# Patient Record
Sex: Female | Born: 1960 | Race: White | Hispanic: No | Marital: Married | Smoking: Former smoker
Health system: Southern US, Community
[De-identification: ages and names within clinical notes are randomized; demographics above are authoritative.]

## PROBLEM LIST (undated history)

## (undated) DIAGNOSIS — Z800 Family history of malignant neoplasm of digestive organs: Secondary | ICD-10-CM

## (undated) DIAGNOSIS — R609 Edema, unspecified: Secondary | ICD-10-CM

## (undated) DIAGNOSIS — I10 Essential (primary) hypertension: Secondary | ICD-10-CM

## (undated) DIAGNOSIS — M8580 Other specified disorders of bone density and structure, unspecified site: Secondary | ICD-10-CM

## (undated) DIAGNOSIS — F429 Obsessive-compulsive disorder, unspecified: Secondary | ICD-10-CM

## (undated) DIAGNOSIS — M1990 Unspecified osteoarthritis, unspecified site: Secondary | ICD-10-CM

## (undated) DIAGNOSIS — Z860101 Personal history of adenomatous and serrated colon polyps: Secondary | ICD-10-CM

## (undated) DIAGNOSIS — F1021 Alcohol dependence, in remission: Secondary | ICD-10-CM

## (undated) DIAGNOSIS — H409 Unspecified glaucoma: Secondary | ICD-10-CM

## (undated) DIAGNOSIS — E559 Vitamin D deficiency, unspecified: Secondary | ICD-10-CM

## (undated) DIAGNOSIS — Z780 Asymptomatic menopausal state: Secondary | ICD-10-CM

## (undated) DIAGNOSIS — D1399 Benign neoplasm of ill-defined sites within the digestive system: Secondary | ICD-10-CM

## (undated) DIAGNOSIS — K219 Gastro-esophageal reflux disease without esophagitis: Secondary | ICD-10-CM

## (undated) DIAGNOSIS — E039 Hypothyroidism, unspecified: Secondary | ICD-10-CM

## (undated) DIAGNOSIS — I5032 Chronic diastolic (congestive) heart failure: Secondary | ICD-10-CM

## (undated) DIAGNOSIS — H269 Unspecified cataract: Secondary | ICD-10-CM

## (undated) DIAGNOSIS — R51 Headache: Secondary | ICD-10-CM

## (undated) DIAGNOSIS — R519 Headache, unspecified: Secondary | ICD-10-CM

## (undated) DIAGNOSIS — N6092 Unspecified benign mammary dysplasia of left breast: Secondary | ICD-10-CM

## (undated) DIAGNOSIS — I219 Acute myocardial infarction, unspecified: Secondary | ICD-10-CM

## (undated) DIAGNOSIS — E119 Type 2 diabetes mellitus without complications: Secondary | ICD-10-CM

## (undated) DIAGNOSIS — I2510 Atherosclerotic heart disease of native coronary artery without angina pectoris: Secondary | ICD-10-CM

## (undated) DIAGNOSIS — G4700 Insomnia, unspecified: Secondary | ICD-10-CM

## (undated) DIAGNOSIS — Z8742 Personal history of other diseases of the female genital tract: Secondary | ICD-10-CM

## (undated) DIAGNOSIS — F411 Generalized anxiety disorder: Secondary | ICD-10-CM

## (undated) DIAGNOSIS — C50912 Malignant neoplasm of unspecified site of left female breast: Secondary | ICD-10-CM

## (undated) DIAGNOSIS — J309 Allergic rhinitis, unspecified: Secondary | ICD-10-CM

## (undated) DIAGNOSIS — R300 Dysuria: Secondary | ICD-10-CM

## (undated) DIAGNOSIS — D139 Benign neoplasm of ill-defined sites within the digestive system: Secondary | ICD-10-CM

## (undated) DIAGNOSIS — IMO0002 Reserved for concepts with insufficient information to code with codable children: Secondary | ICD-10-CM

## (undated) DIAGNOSIS — Z1231 Encounter for screening mammogram for malignant neoplasm of breast: Secondary | ICD-10-CM

## (undated) DIAGNOSIS — F319 Bipolar disorder, unspecified: Secondary | ICD-10-CM

## (undated) DIAGNOSIS — Z8669 Personal history of other diseases of the nervous system and sense organs: Secondary | ICD-10-CM

## (undated) DIAGNOSIS — F05 Delirium due to known physiological condition: Secondary | ICD-10-CM

## (undated) DIAGNOSIS — E7800 Pure hypercholesterolemia, unspecified: Secondary | ICD-10-CM

## (undated) DIAGNOSIS — J45909 Unspecified asthma, uncomplicated: Secondary | ICD-10-CM

## (undated) DIAGNOSIS — D689 Coagulation defect, unspecified: Secondary | ICD-10-CM

## (undated) DIAGNOSIS — K449 Diaphragmatic hernia without obstruction or gangrene: Secondary | ICD-10-CM

## (undated) DIAGNOSIS — C50919 Malignant neoplasm of unspecified site of unspecified female breast: Secondary | ICD-10-CM

## (undated) DIAGNOSIS — F988 Other specified behavioral and emotional disorders with onset usually occurring in childhood and adolescence: Secondary | ICD-10-CM

## (undated) DIAGNOSIS — D126 Benign neoplasm of colon, unspecified: Secondary | ICD-10-CM

## (undated) DIAGNOSIS — Z86010 Personal history of colonic polyps: Secondary | ICD-10-CM

## (undated) DIAGNOSIS — R42 Dizziness and giddiness: Secondary | ICD-10-CM

## (undated) DIAGNOSIS — F32A Depression, unspecified: Secondary | ICD-10-CM

## (undated) DIAGNOSIS — M797 Fibromyalgia: Secondary | ICD-10-CM

## (undated) DIAGNOSIS — K5190 Ulcerative colitis, unspecified, without complications: Secondary | ICD-10-CM

## (undated) DIAGNOSIS — L719 Rosacea, unspecified: Secondary | ICD-10-CM

## (undated) DIAGNOSIS — F329 Major depressive disorder, single episode, unspecified: Secondary | ICD-10-CM

## (undated) HISTORY — DX: Unspecified glaucoma: H40.9

## (undated) HISTORY — DX: Coagulation defect, unspecified: D68.9

## (undated) HISTORY — DX: Personal history of other diseases of the nervous system and sense organs: Z86.69

## (undated) HISTORY — DX: Family history of malignant neoplasm of digestive organs: Z80.0

## (undated) HISTORY — DX: Personal history of other diseases of the female genital tract: Z87.42

## (undated) HISTORY — DX: Personal history of colonic polyps: Z86.010

## (undated) HISTORY — DX: Malignant neoplasm of unspecified site of left female breast: C50.912

## (undated) HISTORY — DX: Asymptomatic menopausal state: Z78.0

## (undated) HISTORY — DX: Alcohol dependence, in remission: F10.21

## (undated) HISTORY — DX: Personal history of adenomatous and serrated colon polyps: Z86.0101

## (undated) HISTORY — DX: Obsessive-compulsive disorder, unspecified: F42.9

## (undated) HISTORY — DX: Fibromyalgia: M79.7

## (undated) HISTORY — DX: Gastro-esophageal reflux disease without esophagitis: K21.9

## (undated) HISTORY — DX: Allergic rhinitis, unspecified: J30.9

## (undated) HISTORY — DX: Hypothyroidism, unspecified: E03.9

## (undated) HISTORY — DX: Reserved for concepts with insufficient information to code with codable children: IMO0002

## (undated) HISTORY — DX: Essential (primary) hypertension: I10

## (undated) HISTORY — DX: Vitamin D deficiency, unspecified: E55.9

## (undated) HISTORY — DX: Depression, unspecified: F32.A

## (undated) HISTORY — PX: RIGHT AND LEFT HEART CATH: CATH118262

## (undated) HISTORY — DX: Encounter for screening mammogram for malignant neoplasm of breast: Z12.31

## (undated) HISTORY — DX: Malignant neoplasm of unspecified site of unspecified female breast: C50.919

## (undated) HISTORY — DX: Unspecified asthma, uncomplicated: J45.909

## (undated) HISTORY — DX: Benign neoplasm of colon, unspecified: D12.6

## (undated) HISTORY — DX: Benign neoplasm of ill-defined sites within the digestive system: D13.9

## (undated) HISTORY — DX: Benign neoplasm of ill-defined sites within the digestive system: D13.99

## (undated) HISTORY — DX: Unspecified benign mammary dysplasia of left breast: N60.92

## (undated) HISTORY — DX: Insomnia, unspecified: G47.00

## (undated) HISTORY — DX: Rosacea, unspecified: L71.9

## (undated) HISTORY — DX: Delirium due to known physiological condition: F05

## (undated) HISTORY — DX: Dysuria: R30.0

## (undated) HISTORY — DX: Diaphragmatic hernia without obstruction or gangrene: K44.9

## (undated) HISTORY — DX: Bipolar disorder, unspecified: F31.9

## (undated) HISTORY — PX: WISDOM TOOTH EXTRACTION: SHX21

## (undated) HISTORY — PX: BUNIONECTOMY: SHX129

## (undated) HISTORY — DX: Ulcerative colitis, unspecified, without complications: K51.90

## (undated) HISTORY — DX: Acute myocardial infarction, unspecified: I21.9

## (undated) HISTORY — DX: Generalized anxiety disorder: F41.1

## (undated) HISTORY — DX: Major depressive disorder, single episode, unspecified: F32.9

## (undated) HISTORY — DX: Unspecified cataract: H26.9

## (undated) HISTORY — DX: Other specified behavioral and emotional disorders with onset usually occurring in childhood and adolescence: F98.8

## (undated) HISTORY — PX: APPENDECTOMY: SHX54

## (undated) HISTORY — DX: Other specified disorders of bone density and structure, unspecified site: M85.80

## (undated) HISTORY — PX: FEMUR FRACTURE SURGERY: SHX633

## (undated) HISTORY — DX: Type 2 diabetes mellitus without complications: E11.9

## (undated) HISTORY — PX: MASTECTOMY: SHX3

## (undated) HISTORY — PX: CATARACT EXTRACTION W/ INTRAOCULAR LENS IMPLANT: SHX1309

## (undated) HISTORY — DX: Dizziness and giddiness: R42

## (undated) HISTORY — PX: TUBAL LIGATION: SHX77

## (undated) HISTORY — DX: Edema, unspecified: R60.9

## (undated) HISTORY — DX: Pure hypercholesterolemia, unspecified: E78.00

## (undated) HISTORY — PX: OTHER SURGICAL HISTORY: SHX169

---

## 1982-01-14 HISTORY — PX: TONSILLECTOMY: SUR1361

## 1995-01-15 HISTORY — PX: CHOLECYSTECTOMY: SHX55

## 1997-01-14 HISTORY — PX: MECKEL DIVERTICULUM EXCISION: SHX314

## 1997-05-14 HISTORY — PX: NASAL SINUS SURGERY: SHX719

## 1997-05-24 ENCOUNTER — Other Ambulatory Visit: Admission: RE | Admit: 1997-05-24 | Discharge: 1997-05-24 | Payer: Self-pay | Admitting: Otolaryngology

## 1998-01-14 DIAGNOSIS — N6092 Unspecified benign mammary dysplasia of left breast: Secondary | ICD-10-CM

## 1998-01-14 HISTORY — DX: Unspecified benign mammary dysplasia of left breast: N60.92

## 1998-08-15 HISTORY — PX: BREAST SURGERY: SHX581

## 1998-08-15 HISTORY — PX: BREAST BIOPSY: SHX20

## 2001-01-14 HISTORY — PX: COLONOSCOPY: SHX174

## 2001-09-14 HISTORY — PX: ESOPHAGOGASTRODUODENOSCOPY: SHX1529

## 2001-09-18 ENCOUNTER — Encounter: Payer: Self-pay | Admitting: Internal Medicine

## 2003-03-15 ENCOUNTER — Encounter: Payer: Self-pay | Admitting: Family Medicine

## 2003-03-15 LAB — CONVERTED CEMR LAB: TSH: 20.14 microintl units/mL

## 2003-11-15 HISTORY — PX: OTHER SURGICAL HISTORY: SHX169

## 2003-11-18 ENCOUNTER — Ambulatory Visit: Payer: Self-pay | Admitting: Cardiology

## 2003-11-30 ENCOUNTER — Ambulatory Visit: Payer: Self-pay | Admitting: Cardiology

## 2004-01-27 ENCOUNTER — Ambulatory Visit: Payer: Self-pay | Admitting: Family Medicine

## 2004-02-13 ENCOUNTER — Ambulatory Visit: Payer: Self-pay | Admitting: Oncology

## 2004-03-09 ENCOUNTER — Ambulatory Visit: Payer: Self-pay | Admitting: Family Medicine

## 2004-03-19 ENCOUNTER — Ambulatory Visit: Payer: Self-pay | Admitting: Family Medicine

## 2004-04-03 ENCOUNTER — Ambulatory Visit: Payer: Self-pay | Admitting: Internal Medicine

## 2004-04-14 HISTORY — PX: COLONOSCOPY: SHX174

## 2004-04-17 ENCOUNTER — Ambulatory Visit: Payer: Self-pay | Admitting: Internal Medicine

## 2005-01-18 ENCOUNTER — Encounter (INDEPENDENT_AMBULATORY_CARE_PROVIDER_SITE_OTHER): Payer: Self-pay | Admitting: *Deleted

## 2005-01-18 ENCOUNTER — Ambulatory Visit: Payer: Self-pay | Admitting: Family Medicine

## 2005-01-18 ENCOUNTER — Other Ambulatory Visit: Admission: RE | Admit: 2005-01-18 | Discharge: 2005-01-18 | Payer: Self-pay | Admitting: Family Medicine

## 2005-01-18 LAB — CONVERTED CEMR LAB: Pap Smear: NORMAL

## 2005-01-30 ENCOUNTER — Ambulatory Visit: Payer: Self-pay | Admitting: Family Medicine

## 2005-03-21 ENCOUNTER — Ambulatory Visit: Payer: Self-pay | Admitting: Family Medicine

## 2005-03-27 ENCOUNTER — Ambulatory Visit: Payer: Self-pay | Admitting: Internal Medicine

## 2005-05-01 ENCOUNTER — Ambulatory Visit: Payer: Self-pay | Admitting: Family Medicine

## 2005-05-22 ENCOUNTER — Ambulatory Visit: Payer: Self-pay | Admitting: Family Medicine

## 2005-06-12 ENCOUNTER — Ambulatory Visit: Payer: Self-pay | Admitting: Oncology

## 2005-06-14 ENCOUNTER — Ambulatory Visit: Payer: Self-pay | Admitting: Oncology

## 2005-09-27 ENCOUNTER — Ambulatory Visit: Payer: Self-pay | Admitting: Internal Medicine

## 2005-10-22 ENCOUNTER — Ambulatory Visit: Payer: Self-pay | Admitting: Internal Medicine

## 2006-04-22 ENCOUNTER — Ambulatory Visit: Payer: Self-pay | Admitting: Family Medicine

## 2006-04-23 ENCOUNTER — Encounter: Payer: Self-pay | Admitting: Family Medicine

## 2006-07-22 ENCOUNTER — Ambulatory Visit: Payer: Self-pay | Admitting: Family Medicine

## 2006-07-22 LAB — CONVERTED CEMR LAB
Bilirubin Urine: NEGATIVE
Glucose, Urine, Semiquant: NEGATIVE
Ketones, urine, test strip: NEGATIVE
Nitrite: NEGATIVE
Protein, U semiquant: NEGATIVE
Specific Gravity, Urine: 1.015
Urobilinogen, UA: NEGATIVE
pH: 7

## 2006-07-28 ENCOUNTER — Emergency Department: Payer: Self-pay | Admitting: Internal Medicine

## 2006-08-05 ENCOUNTER — Telehealth (INDEPENDENT_AMBULATORY_CARE_PROVIDER_SITE_OTHER): Payer: Self-pay | Admitting: *Deleted

## 2006-08-06 ENCOUNTER — Ambulatory Visit: Payer: Self-pay | Admitting: Family Medicine

## 2006-08-06 LAB — CONVERTED CEMR LAB
Bilirubin Urine: NEGATIVE
Blood in Urine, dipstick: NEGATIVE
Glucose, Urine, Semiquant: NEGATIVE
Ketones, urine, test strip: NEGATIVE
Nitrite: NEGATIVE
Protein, U semiquant: NEGATIVE
Specific Gravity, Urine: <1.005
Urobilinogen, UA: NEGATIVE
WBC Urine, dipstick: NEGATIVE
pH: 7

## 2006-08-07 ENCOUNTER — Encounter (INDEPENDENT_AMBULATORY_CARE_PROVIDER_SITE_OTHER): Payer: Self-pay | Admitting: Internal Medicine

## 2006-08-08 ENCOUNTER — Telehealth (INDEPENDENT_AMBULATORY_CARE_PROVIDER_SITE_OTHER): Payer: Self-pay | Admitting: *Deleted

## 2006-08-19 ENCOUNTER — Encounter (INDEPENDENT_AMBULATORY_CARE_PROVIDER_SITE_OTHER): Payer: Self-pay | Admitting: Internal Medicine

## 2006-08-19 ENCOUNTER — Ambulatory Visit: Payer: Self-pay | Admitting: Family Medicine

## 2006-08-19 LAB — CONVERTED CEMR LAB
Glucose, Urine, Semiquant: UNDETERMINED
Ketones, urine, test strip: UNDETERMINED
Nitrite: UNDETERMINED
Protein, U semiquant: UNDETERMINED

## 2006-10-09 ENCOUNTER — Encounter: Payer: Self-pay | Admitting: Family Medicine

## 2006-10-28 ENCOUNTER — Telehealth (INDEPENDENT_AMBULATORY_CARE_PROVIDER_SITE_OTHER): Payer: Self-pay | Admitting: Internal Medicine

## 2006-10-30 ENCOUNTER — Ambulatory Visit: Payer: Self-pay | Admitting: Family Medicine

## 2006-10-30 DIAGNOSIS — M797 Fibromyalgia: Secondary | ICD-10-CM | POA: Insufficient documentation

## 2006-10-30 DIAGNOSIS — G4700 Insomnia, unspecified: Secondary | ICD-10-CM | POA: Insufficient documentation

## 2006-12-03 ENCOUNTER — Telehealth: Payer: Self-pay | Admitting: Family Medicine

## 2006-12-15 HISTORY — PX: COLONOSCOPY: SHX174

## 2006-12-25 ENCOUNTER — Ambulatory Visit: Payer: Self-pay | Admitting: Internal Medicine

## 2007-01-07 ENCOUNTER — Ambulatory Visit: Payer: Self-pay | Admitting: Family Medicine

## 2007-01-07 DIAGNOSIS — F319 Bipolar disorder, unspecified: Secondary | ICD-10-CM

## 2007-01-07 DIAGNOSIS — J45909 Unspecified asthma, uncomplicated: Secondary | ICD-10-CM | POA: Insufficient documentation

## 2007-01-07 DIAGNOSIS — E1169 Type 2 diabetes mellitus with other specified complication: Secondary | ICD-10-CM | POA: Insufficient documentation

## 2007-01-07 DIAGNOSIS — K5190 Ulcerative colitis, unspecified, without complications: Secondary | ICD-10-CM | POA: Insufficient documentation

## 2007-01-07 DIAGNOSIS — E7800 Pure hypercholesterolemia, unspecified: Secondary | ICD-10-CM

## 2007-01-07 DIAGNOSIS — Z87898 Personal history of other specified conditions: Secondary | ICD-10-CM | POA: Insufficient documentation

## 2007-01-07 DIAGNOSIS — I10 Essential (primary) hypertension: Secondary | ICD-10-CM | POA: Insufficient documentation

## 2007-01-07 DIAGNOSIS — J309 Allergic rhinitis, unspecified: Secondary | ICD-10-CM | POA: Insufficient documentation

## 2007-01-07 DIAGNOSIS — F3130 Bipolar disorder, current episode depressed, mild or moderate severity, unspecified: Secondary | ICD-10-CM | POA: Insufficient documentation

## 2007-01-07 DIAGNOSIS — K219 Gastro-esophageal reflux disease without esophagitis: Secondary | ICD-10-CM | POA: Insufficient documentation

## 2007-01-07 DIAGNOSIS — L719 Rosacea, unspecified: Secondary | ICD-10-CM | POA: Insufficient documentation

## 2007-01-07 DIAGNOSIS — E039 Hypothyroidism, unspecified: Secondary | ICD-10-CM | POA: Insufficient documentation

## 2007-01-09 DIAGNOSIS — Z860100 Personal history of colon polyps, unspecified: Secondary | ICD-10-CM | POA: Insufficient documentation

## 2007-01-09 DIAGNOSIS — K449 Diaphragmatic hernia without obstruction or gangrene: Secondary | ICD-10-CM | POA: Insufficient documentation

## 2007-01-09 DIAGNOSIS — D139 Benign neoplasm of ill-defined sites within the digestive system: Secondary | ICD-10-CM | POA: Insufficient documentation

## 2007-01-09 LAB — CONVERTED CEMR LAB
Albumin: 3.6 g/dL (ref 3.5–5.2)
BUN: 13 mg/dL (ref 6–23)
CO2: 30 meq/L (ref 19–32)
Calcium: 9.5 mg/dL (ref 8.4–10.5)
Chloride: 103 meq/L (ref 96–112)
Creatinine, Ser: 0.7 mg/dL (ref 0.4–1.2)
GFR calc Af Amer: 115 mL/min
GFR calc non Af Amer: 95 mL/min
Glucose, Bld: 99 mg/dL (ref 70–99)
Phosphorus: 3.1 mg/dL (ref 2.3–4.6)
Potassium: 3.8 meq/L (ref 3.5–5.1)
Pro B Natriuretic peptide (BNP): 7 pg/mL (ref 0.0–100.0)
Sodium: 139 meq/L (ref 135–145)

## 2007-01-14 ENCOUNTER — Encounter: Payer: Self-pay | Admitting: Internal Medicine

## 2007-01-14 ENCOUNTER — Encounter: Payer: Self-pay | Admitting: Family Medicine

## 2007-01-14 ENCOUNTER — Ambulatory Visit (HOSPITAL_COMMUNITY): Admission: RE | Admit: 2007-01-14 | Discharge: 2007-01-14 | Payer: Self-pay | Admitting: Internal Medicine

## 2007-01-14 LAB — HM COLONOSCOPY: HM Colonoscopy: NORMAL

## 2007-01-20 ENCOUNTER — Ambulatory Visit: Payer: Self-pay | Admitting: Internal Medicine

## 2007-01-29 ENCOUNTER — Ambulatory Visit: Payer: Self-pay | Admitting: Family Medicine

## 2007-02-02 ENCOUNTER — Encounter (INDEPENDENT_AMBULATORY_CARE_PROVIDER_SITE_OTHER): Payer: Self-pay | Admitting: *Deleted

## 2007-02-02 LAB — CONVERTED CEMR LAB
Albumin: 3.7 g/dL (ref 3.5–5.2)
BUN: 13 mg/dL (ref 6–23)
CO2: 33 meq/L — ABNORMAL HIGH (ref 19–32)
Calcium: 9.6 mg/dL (ref 8.4–10.5)
Chloride: 102 meq/L (ref 96–112)
Creatinine, Ser: 0.8 mg/dL (ref 0.4–1.2)
GFR calc Af Amer: 98 mL/min
GFR calc non Af Amer: 81 mL/min
Glucose, Bld: 122 mg/dL — ABNORMAL HIGH (ref 70–99)
Phosphorus: 2.3 mg/dL (ref 2.3–4.6)
Potassium: 3.3 meq/L — ABNORMAL LOW (ref 3.5–5.1)
Sodium: 142 meq/L (ref 135–145)

## 2007-03-17 ENCOUNTER — Ambulatory Visit: Payer: Self-pay | Admitting: Family Medicine

## 2007-05-11 ENCOUNTER — Telehealth: Payer: Self-pay | Admitting: Family Medicine

## 2007-06-03 ENCOUNTER — Telehealth: Payer: Self-pay | Admitting: Family Medicine

## 2007-06-09 ENCOUNTER — Telehealth (INDEPENDENT_AMBULATORY_CARE_PROVIDER_SITE_OTHER): Payer: Self-pay | Admitting: Internal Medicine

## 2007-06-09 ENCOUNTER — Telehealth: Payer: Self-pay | Admitting: Family Medicine

## 2007-06-15 HISTORY — PX: OTHER SURGICAL HISTORY: SHX169

## 2007-06-18 ENCOUNTER — Ambulatory Visit: Payer: Self-pay | Admitting: Family Medicine

## 2007-06-22 ENCOUNTER — Ambulatory Visit: Payer: Self-pay | Admitting: Family Medicine

## 2007-06-23 LAB — CONVERTED CEMR LAB
ALT: 29 units/L (ref 0–35)
AST: 26 units/L (ref 0–37)
Albumin: 4 g/dL (ref 3.5–5.2)
Alkaline Phosphatase: 49 units/L (ref 39–117)
BUN: 13 mg/dL (ref 6–23)
Basophils Absolute: 0 10*3/uL (ref 0.0–0.1)
Basophils Relative: 0.7 % (ref 0.0–1.0)
Bilirubin, Direct: 0.1 mg/dL (ref 0.0–0.3)
CO2: 28 meq/L (ref 19–32)
Calcium: 9.4 mg/dL (ref 8.4–10.5)
Chloride: 99 meq/L (ref 96–112)
Cholesterol: 191 mg/dL (ref 0–200)
Creatinine, Ser: 0.9 mg/dL (ref 0.4–1.2)
Eosinophils Absolute: 0.2 10*3/uL (ref 0.0–0.7)
Eosinophils Relative: 3.3 % (ref 0.0–5.0)
GFR calc Af Amer: 86 mL/min
GFR calc non Af Amer: 71 mL/min
Glucose, Bld: 105 mg/dL — ABNORMAL HIGH (ref 70–99)
HCT: 41.1 % (ref 36.0–46.0)
HDL: 50.3 mg/dL (ref >39.0)
Hemoglobin: 14 g/dL (ref 12.0–15.0)
LDL Cholesterol: 124 mg/dL — ABNORMAL HIGH (ref 0–99)
Lymphocytes Relative: 23.2 % (ref 12.0–46.0)
MCHC: 34.2 g/dL (ref 30.0–36.0)
MCV: 92.9 fL (ref 78.0–100.0)
Monocytes Absolute: 0.6 10*3/uL (ref 0.1–1.0)
Monocytes Relative: 11 % (ref 3.0–12.0)
Neutro Abs: 3.3 10*3/uL (ref 1.4–7.7)
Neutrophils Relative %: 61.8 % (ref 43.0–77.0)
Platelets: 333 10*3/uL (ref 150–400)
Potassium: 3.7 meq/L (ref 3.5–5.1)
RBC: 4.42 M/uL (ref 3.87–5.11)
RDW: 15 % — ABNORMAL HIGH (ref 11.5–14.6)
Sodium: 139 meq/L (ref 135–145)
Total Bilirubin: 0.8 mg/dL (ref 0.3–1.2)
Total CHOL/HDL Ratio: 3.8
Total Protein: 7.1 g/dL (ref 6.0–8.3)
Triglycerides: 85 mg/dL (ref 0–149)
VLDL: 17 mg/dL (ref 0–40)
WBC: 5.4 10*3/uL (ref 4.5–10.5)

## 2007-06-25 ENCOUNTER — Ambulatory Visit: Payer: Self-pay

## 2007-06-25 ENCOUNTER — Encounter: Payer: Self-pay | Admitting: Family Medicine

## 2007-07-10 ENCOUNTER — Encounter: Payer: Self-pay | Admitting: Internal Medicine

## 2007-07-16 ENCOUNTER — Encounter: Payer: Self-pay | Admitting: Family Medicine

## 2007-07-16 ENCOUNTER — Ambulatory Visit: Payer: Self-pay | Admitting: Family Medicine

## 2007-07-21 ENCOUNTER — Encounter (INDEPENDENT_AMBULATORY_CARE_PROVIDER_SITE_OTHER): Payer: Self-pay | Admitting: *Deleted

## 2007-07-21 ENCOUNTER — Encounter: Payer: Self-pay | Admitting: Family Medicine

## 2007-08-25 ENCOUNTER — Encounter: Payer: Self-pay | Admitting: Family Medicine

## 2007-10-05 ENCOUNTER — Encounter: Payer: Self-pay | Admitting: Internal Medicine

## 2007-10-13 ENCOUNTER — Encounter: Payer: Self-pay | Admitting: Family Medicine

## 2007-10-20 ENCOUNTER — Ambulatory Visit: Payer: Self-pay | Admitting: Family Medicine

## 2007-11-13 ENCOUNTER — Telehealth: Payer: Self-pay | Admitting: Family Medicine

## 2007-11-15 HISTORY — PX: OTHER SURGICAL HISTORY: SHX169

## 2007-11-20 ENCOUNTER — Encounter: Payer: Self-pay | Admitting: Internal Medicine

## 2008-01-11 ENCOUNTER — Telehealth: Payer: Self-pay | Admitting: Internal Medicine

## 2008-01-20 ENCOUNTER — Ambulatory Visit: Payer: Self-pay | Admitting: Internal Medicine

## 2008-01-22 ENCOUNTER — Telehealth: Payer: Self-pay | Admitting: Family Medicine

## 2008-02-05 ENCOUNTER — Encounter: Payer: Self-pay | Admitting: Family Medicine

## 2008-02-29 ENCOUNTER — Telehealth: Payer: Self-pay | Admitting: Family Medicine

## 2008-03-03 ENCOUNTER — Telehealth: Payer: Self-pay | Admitting: Family Medicine

## 2008-03-18 ENCOUNTER — Ambulatory Visit: Payer: Self-pay | Admitting: Family Medicine

## 2008-03-18 DIAGNOSIS — E559 Vitamin D deficiency, unspecified: Secondary | ICD-10-CM | POA: Insufficient documentation

## 2008-04-01 ENCOUNTER — Encounter: Payer: Self-pay | Admitting: Family Medicine

## 2008-04-08 ENCOUNTER — Telehealth: Payer: Self-pay | Admitting: Family Medicine

## 2008-04-18 ENCOUNTER — Telehealth: Payer: Self-pay | Admitting: Internal Medicine

## 2008-04-18 ENCOUNTER — Telehealth: Payer: Self-pay | Admitting: Family Medicine

## 2008-05-18 ENCOUNTER — Telehealth (INDEPENDENT_AMBULATORY_CARE_PROVIDER_SITE_OTHER): Payer: Self-pay | Admitting: Internal Medicine

## 2008-06-20 ENCOUNTER — Telehealth: Payer: Self-pay | Admitting: Family Medicine

## 2008-06-22 ENCOUNTER — Ambulatory Visit: Payer: Self-pay | Admitting: Family Medicine

## 2008-06-22 DIAGNOSIS — Z780 Asymptomatic menopausal state: Secondary | ICD-10-CM | POA: Insufficient documentation

## 2008-06-24 LAB — CONVERTED CEMR LAB
ALT: 44 units/L — ABNORMAL HIGH (ref 0–35)
AST: 41 units/L — ABNORMAL HIGH (ref 0–37)
Albumin: 4 g/dL (ref 3.5–5.2)
Alkaline Phosphatase: 77 units/L (ref 39–117)
BUN: 12 mg/dL (ref 6–23)
Basophils Absolute: 0 10*3/uL (ref 0.0–0.1)
Basophils Relative: 0.5 % (ref 0.0–3.0)
Bilirubin, Direct: 0.1 mg/dL (ref 0.0–0.3)
CO2: 29 meq/L (ref 19–32)
Calcium: 9.6 mg/dL (ref 8.4–10.5)
Chloride: 109 meq/L (ref 96–112)
Cholesterol: 314 mg/dL — ABNORMAL HIGH (ref 0–200)
Creatinine, Ser: 0.7 mg/dL (ref 0.4–1.2)
Direct LDL: 227.3 mg/dL
Eosinophils Absolute: 0.2 10*3/uL (ref 0.0–0.7)
Eosinophils Relative: 2.8 % (ref 0.0–5.0)
GFR calc non Af Amer: 94.17 mL/min (ref >60)
Glucose, Bld: 99 mg/dL (ref 70–99)
HCT: 39.3 % (ref 36.0–46.0)
HDL: 46.4 mg/dL (ref >39.00)
Hemoglobin: 13.8 g/dL (ref 12.0–15.0)
Lymphocytes Relative: 24 % (ref 12.0–46.0)
Lymphs Abs: 1.6 10*3/uL (ref 0.7–4.0)
MCHC: 35.2 g/dL (ref 30.0–36.0)
MCV: 89.9 fL (ref 78.0–100.0)
Monocytes Absolute: 0.6 10*3/uL (ref 0.1–1.0)
Monocytes Relative: 9 % (ref 3.0–12.0)
Neutro Abs: 4.3 10*3/uL (ref 1.4–7.7)
Neutrophils Relative %: 63.7 % (ref 43.0–77.0)
Platelets: 323 10*3/uL (ref 150.0–400.0)
Potassium: 4.3 meq/L (ref 3.5–5.1)
RBC: 4.37 M/uL (ref 3.87–5.11)
RDW: 13.7 % (ref 11.5–14.6)
Sodium: 144 meq/L (ref 135–145)
TSH: 13.58 microintl units/mL — ABNORMAL HIGH (ref 0.35–5.50)
Total Bilirubin: 0.6 mg/dL (ref 0.3–1.2)
Total CHOL/HDL Ratio: 7
Total Protein: 6.7 g/dL (ref 6.0–8.3)
Triglycerides: 276 mg/dL — ABNORMAL HIGH (ref 0.0–149.0)
VLDL: 55.2 mg/dL — ABNORMAL HIGH (ref 0.0–40.0)
WBC: 6.7 10*3/uL (ref 4.5–10.5)

## 2008-07-01 ENCOUNTER — Telehealth (INDEPENDENT_AMBULATORY_CARE_PROVIDER_SITE_OTHER): Payer: Self-pay | Admitting: Internal Medicine

## 2008-07-05 LAB — CONVERTED CEMR LAB: Vit D, 25-Hydroxy: 21 ng/mL — ABNORMAL LOW (ref 30–89)

## 2008-07-15 ENCOUNTER — Telehealth: Payer: Self-pay | Admitting: Family Medicine

## 2008-07-21 ENCOUNTER — Ambulatory Visit: Payer: Self-pay | Admitting: Family Medicine

## 2008-07-22 ENCOUNTER — Ambulatory Visit: Payer: Self-pay

## 2008-07-26 ENCOUNTER — Ambulatory Visit: Payer: Self-pay | Admitting: Family Medicine

## 2008-07-26 LAB — CONVERTED CEMR LAB
ALT: 36 units/L — ABNORMAL HIGH (ref 0–35)
AST: 25 units/L (ref 0–37)
Cholesterol: 191 mg/dL (ref 0–200)
HDL: 43.8 mg/dL (ref >39.00)
LDL Cholesterol: 114 mg/dL — ABNORMAL HIGH (ref 0–99)
Total CHOL/HDL Ratio: 4
Triglycerides: 167 mg/dL — ABNORMAL HIGH (ref 0.0–149.0)
VLDL: 33.4 mg/dL (ref 0.0–40.0)

## 2008-07-27 LAB — CONVERTED CEMR LAB
Free T4: 1.5 ng/dL (ref 0.6–1.6)
T4, Total: 9.5 ug/dL (ref 5.0–12.5)
TSH: 0.55 microintl units/mL (ref 0.35–5.50)

## 2008-08-17 ENCOUNTER — Encounter: Payer: Self-pay | Admitting: Family Medicine

## 2008-08-17 ENCOUNTER — Ambulatory Visit: Payer: Self-pay | Admitting: Family Medicine

## 2008-08-24 ENCOUNTER — Encounter (INDEPENDENT_AMBULATORY_CARE_PROVIDER_SITE_OTHER): Payer: Self-pay | Admitting: *Deleted

## 2008-09-01 ENCOUNTER — Ambulatory Visit: Payer: Self-pay | Admitting: Family Medicine

## 2008-09-14 LAB — CONVERTED CEMR LAB: Pap Smear: NORMAL

## 2008-11-16 ENCOUNTER — Telehealth: Payer: Self-pay | Admitting: Family Medicine

## 2008-11-24 ENCOUNTER — Ambulatory Visit: Payer: Self-pay | Admitting: Family Medicine

## 2008-11-25 LAB — CONVERTED CEMR LAB
ALT: 36 units/L — ABNORMAL HIGH (ref 0–35)
AST: 26 units/L (ref 0–37)
Albumin: 3.8 g/dL (ref 3.5–5.2)
Alkaline Phosphatase: 73 units/L (ref 39–117)
Bilirubin, Direct: 0 mg/dL (ref 0.0–0.3)
Cholesterol: 181 mg/dL (ref 0–200)
Free T4: 1.2 ng/dL (ref 0.6–1.6)
HDL: 46.8 mg/dL (ref >39.00)
LDL Cholesterol: 114 mg/dL — ABNORMAL HIGH (ref 0–99)
T3 Uptake Ratio: 39.1 % — ABNORMAL HIGH (ref 22.5–37.0)
TSH: 0.09 microintl units/mL — ABNORMAL LOW (ref 0.35–5.50)
Total Bilirubin: 0.8 mg/dL (ref 0.3–1.2)
Total CHOL/HDL Ratio: 4
Total Protein: 7 g/dL (ref 6.0–8.3)
Triglycerides: 100 mg/dL (ref 0.0–149.0)
VLDL: 20 mg/dL (ref 0.0–40.0)

## 2008-11-28 ENCOUNTER — Encounter (INDEPENDENT_AMBULATORY_CARE_PROVIDER_SITE_OTHER): Payer: Self-pay | Admitting: *Deleted

## 2008-11-28 LAB — CONVERTED CEMR LAB: Vit D, 25-Hydroxy: 33 ng/mL (ref 30–89)

## 2008-11-29 ENCOUNTER — Telehealth: Payer: Self-pay | Admitting: Family Medicine

## 2008-12-07 ENCOUNTER — Telehealth: Payer: Self-pay | Admitting: Family Medicine

## 2008-12-13 ENCOUNTER — Telehealth: Payer: Self-pay | Admitting: Family Medicine

## 2008-12-13 ENCOUNTER — Ambulatory Visit: Payer: Self-pay | Admitting: Family Medicine

## 2009-02-01 ENCOUNTER — Encounter: Payer: Self-pay | Admitting: Family Medicine

## 2009-02-07 ENCOUNTER — Telehealth: Payer: Self-pay | Admitting: Family Medicine

## 2009-02-27 ENCOUNTER — Telehealth (INDEPENDENT_AMBULATORY_CARE_PROVIDER_SITE_OTHER): Payer: Self-pay | Admitting: *Deleted

## 2009-02-27 ENCOUNTER — Ambulatory Visit: Payer: Self-pay | Admitting: Family Medicine

## 2009-03-02 ENCOUNTER — Ambulatory Visit: Payer: Self-pay | Admitting: Podiatry

## 2009-03-30 ENCOUNTER — Encounter: Payer: Self-pay | Admitting: Family Medicine

## 2009-05-17 ENCOUNTER — Encounter: Payer: Self-pay | Admitting: Family Medicine

## 2009-06-16 ENCOUNTER — Telehealth: Payer: Self-pay | Admitting: Family Medicine

## 2009-07-18 ENCOUNTER — Telehealth: Payer: Self-pay | Admitting: Family Medicine

## 2009-07-22 LAB — HM MAMMOGRAPHY: HM Mammogram: NORMAL

## 2009-07-28 ENCOUNTER — Encounter: Payer: Self-pay | Admitting: Family Medicine

## 2009-08-09 ENCOUNTER — Ambulatory Visit: Payer: Self-pay | Admitting: Family Medicine

## 2009-08-09 DIAGNOSIS — R5383 Other fatigue: Secondary | ICD-10-CM | POA: Insufficient documentation

## 2009-08-09 DIAGNOSIS — R5381 Other malaise: Secondary | ICD-10-CM | POA: Insufficient documentation

## 2009-08-14 ENCOUNTER — Telehealth: Payer: Self-pay | Admitting: Family Medicine

## 2009-08-22 ENCOUNTER — Telehealth: Payer: Self-pay | Admitting: Family Medicine

## 2009-09-08 ENCOUNTER — Telehealth: Payer: Self-pay | Admitting: Family Medicine

## 2009-09-28 ENCOUNTER — Ambulatory Visit: Payer: Self-pay | Admitting: Family Medicine

## 2009-09-29 ENCOUNTER — Encounter: Payer: Self-pay | Admitting: Family Medicine

## 2009-10-06 ENCOUNTER — Encounter: Payer: Self-pay | Admitting: Family Medicine

## 2009-10-10 ENCOUNTER — Ambulatory Visit: Payer: Self-pay | Admitting: Family Medicine

## 2009-10-10 ENCOUNTER — Telehealth (INDEPENDENT_AMBULATORY_CARE_PROVIDER_SITE_OTHER): Payer: Self-pay | Admitting: *Deleted

## 2009-10-11 ENCOUNTER — Encounter: Payer: Self-pay | Admitting: Family Medicine

## 2009-10-13 ENCOUNTER — Ambulatory Visit: Payer: Self-pay | Admitting: Family Medicine

## 2009-11-20 ENCOUNTER — Telehealth: Payer: Self-pay | Admitting: Family Medicine

## 2009-11-21 ENCOUNTER — Ambulatory Visit: Payer: Self-pay

## 2009-12-14 ENCOUNTER — Telehealth: Payer: Self-pay | Admitting: Family Medicine

## 2009-12-22 ENCOUNTER — Telehealth: Payer: Self-pay | Admitting: Family Medicine

## 2010-01-22 ENCOUNTER — Telehealth: Payer: Self-pay | Admitting: Family Medicine

## 2010-01-29 ENCOUNTER — Encounter: Payer: Self-pay | Admitting: Internal Medicine

## 2010-02-12 ENCOUNTER — Encounter: Payer: Self-pay | Admitting: Family Medicine

## 2010-02-15 ENCOUNTER — Encounter: Payer: Self-pay | Admitting: Family Medicine

## 2010-02-15 NOTE — Progress Notes (Signed)
Summary: Rx Tramadol  Phone Note Refill Request Call back at [phone] Message from:  [facility] on February 07, 2009 8:08 AM  Refills Requested: Medication #1:  TRAMADOL HCL 50 MG TABS 1-2 by mouth three times a day as needed   Last Refilled: 12/12/2008 Received faxed refill request, please advise   Method Requested: Telephone to Pharmacy Initial call taken by: Linde Gillis CMA Duncan Dull),  February 07, 2009 8:09 AM  Follow-up for Phone Call        she was given 81mo px on 11/29-- should not be out yet?  Follow-up by: Judith Part MD,  February 07, 2009 8:32 AM  Additional Follow-up for Phone Call Additional follow up Details #1::        [facility] for pt to call.   Lowella Petties CMA  February 07, 2009 8:39 AM  [facility] called this in back in november for 180 tabs, which is a one month supply, with no refills.  She didnt call in the 3 month supply.  thank you px written on EMR for call in  Additional Follow-up by: Lowella Petties CMA,  February 07, 2009 1:06 PM    Additional Follow-up for Phone Call Additional follow up Details #2::    [facility] Follow-up by: Lowella Petties CMA,  February 07, 2009 2:22 PM  New/Updated Medications: TRAMADOL HCL 50 MG TABS (TRAMADOL HCL) 1-2 by mouth three times a day as needed Prescriptions: TRAMADOL HCL 50 MG TABS (TRAMADOL HCL) 1-2 by mouth three times a day as needed  #180 x 3   Entered and Authorized by:   Judith Part MD   Signed by:   Judith Part MD on 02/07/2009   Method used:   Telephoned to ...       [facility] [phone]* (retail)       [address]       [place], [place]  [address]       Ph: [phone]       Fax: [phone]   RxID:   [id number]

## 2010-02-15 NOTE — Progress Notes (Signed)
Summary: refill request for tramadol  Phone Note Refill Request Message from:  Fax from Pharmacy  Refills Requested: Medication #1:  TRAMADOL HCL 50 MG TABS take one tablet by mouth in AM   Last Refilled: 11/20/2009 Faxed request from [facility], [phone].  Initial call taken by: Lowella Petties CMA, AAMA,  December 22, 2009 10:21 AM  Follow-up for Phone Call        px written on EMR for call in  Follow-up by: Judith Part MD,  December 22, 2009 10:24 AM  Additional Follow-up for Phone Call Additional follow up Details #1::        [facility] as instructed. Lewanda Rife LPN  December 22, 2009 4:37 PM     Prescriptions: TRAMADOL HCL 50 MG TABS (TRAMADOL HCL) take one tablet by mouth in AM, one tablet by mouth at lunch and take two tablets by mouth at bedtime  #120 x 0   Entered and Authorized by:   Judith Part MD   Signed by:   Lewanda Rife LPN on 45/40/9811   Method used:   Telephoned to ...       [facility] [phone]* (retail)       [address]       [place], [place]  [address]       Ph: [phone]       Fax: [phone]   RxID:   [id number]

## 2010-02-15 NOTE — Assessment & Plan Note (Signed)
Summary: FLU SHOT/BIR   Nurse Visit    Prior Medications: NORVASC 5 MG TABS (AMLODIPINE BESYLATE) 1 by mouth two times a day FLUTICASONE PROPIONATE 50 MCG/ACT SUSP (FLUTICASONE PROPIONATE)  SIMVASTATIN 20 MG TABS (SIMVASTATIN) 1 by mouth once daily MERCAPTOPURINE 50 MG TABS (MERCAPTOPURINE) Take 1 tablet by mouth once a day TRAMADOL HCL 50 MG TABS (TRAMADOL HCL) 1 by mouth three times a day prn SYNTHROID 112 MCG  TABS (LEVOTHYROXINE SODIUM) 2 by mouth once daily WELLBUTRIN XL 150 MG  TB24 (BUPROPION HCL) 3 by mouth qd EFFEXOR XR 75 MG  CP24 (VENLAFAXINE HCL) Take 3 by mouth qd TRAZODONE HCL 150 MG  TABS (TRAZODONE HCL) 3 by mouth qhs MELATONIN 3 MG  CAPS (MELATONIN) at bedtime ADDERALL 30 MG  TABS (AMPHETAMINE-DEXTROAMPHETAMINE) take one by mouth every morning ADDERALL 10 MG  TABS (AMPHETAMINE-DEXTROAMPHETAMINE) 1- 2 tabs at lunch ABILIFY 10 MG  TABS (ARIPIPRAZOLE) Take 1 tablet by mouth once a day BUSPAR 15 MG  TABS (BUSPIRONE HCL) Take 1 tablet by mouth qid FLEXERIL 10 MG  TABS (CYCLOBENZAPRINE HCL) 2 by mouth at bedtime as needed HYDROCHLOROTHIAZIDE 25 MG  TABS (HYDROCHLOROTHIAZIDE) Take one by mouth once a day POTASSIUM CHLORIDE CR 10 MEQ  CPCR (POTASSIUM CHLORIDE) take one by mouth once daily DARVOCET A500 100-500 MG  TABS (PROPOXYPHENE N-APAP) 1 by mouth q 6 hours as needed pain FROVA 2.5 MG  TABS (FROVATRIPTAN SUCCINATE) take 1-2 as needed for migraines CHLORPROMAZINE HCL 25 MG  TABS (CHLORPROMAZINE HCL) take 1-2 as needed for migraines if frova doesnt help CALTRATE 600+D 600-400 MG-UNIT  TABS (CALCIUM CARBONATE-VITAMIN D) take one by mouth twice a day PROTONIX 40 MG  TBEC (PANTOPRAZOLE SODIUM) Take 1 tablet by mouth once a day. MUST HAVE OFFICE VISIT FOR ANY FURTHER REFILLS! TYLENOL ARTHRITIS PAIN 650 MG  TBCR (ACETAMINOPHEN) take 2 by mouth twice a day MACRODANTIN () as needed with sexual activity as directed Current Allergies: ! ERYTHROMYCIN ASA NSAIDS    Orders  Added: 1)  Admin 1st Vaccine [90471] 2)  Flu Vaccine 75yr + [[38182]  Flu Vaccine Consent Questions     Do you have a history of severe allergic reactions to this vaccine? no    Any prior history of allergic reactions to egg and/or gelatin? no    Do you have a sensitivity to the preservative Thimersol? no    Do you have a past history of Guillan-Barre Syndrome? no    Do you currently have an acute febrile illness? no    Have you ever had a severe reaction to latex? no    Vaccine information given and explained to patient? yes    Are you currently pregnant? no    Lot Number:[id number]   Site Given Right Deltoid IM].opcflu

## 2010-02-15 NOTE — Progress Notes (Signed)
Summary: Cyclobenzaprine  Phone Note Refill Request Message from:  [facility] on June 16, 2009 11:24 AM  Refills Requested: Medication #1:  FLEXERIL 10 MG  TABS 2 by mouth at bedtime [facility]  [address] [id number]   Last Fill Date:  No date sent   Pharmacy Phone:  [phone]   Method Requested: Telephone to Pharmacy Initial call taken by: Delilah Shan CMA Duncan Dull),  June 16, 2009 11:24 AM  Follow-up for Phone Call        dosing is usually not more than 1 pill at a time -- please check in with pt -- 2 pills at bedtime is not recommended   Follow-up by: Judith Part MD,  June 16, 2009 2:00 PM  Additional Follow-up for Phone Call Additional follow up Details #1::        Left message for patient to call back. Lewanda Rife LPN  June 17, 8655 3:41 PM   Patient notified as instructed by telephone. Pt said she is taking 2 at bedtime. Pt will ck with pharmacy later this evening.Lewanda Rife LPN  June 17, 8467 4:45 PM     Additional Follow-up for Phone Call Additional follow up Details #2::    [facility] as instructed. Lewanda Rife LPN  June 16, 6293 4:54 PM   Prescriptions: FLEXERIL 10 MG  TABS (CYCLOBENZAPRINE HCL) 2 by mouth at bedtime  #60 x 5   Entered and Authorized by:   Judith Part MD   Signed by:   Judith Part MD on 06/16/2009   Method used:   Telephoned to ...       [facility] [phone]* (retail)       [address]       [place], [place]  [address]       Ph: [phone]       Fax: [phone]   RxID:   [id number]

## 2010-02-15 NOTE — Letter (Signed)
Summary: Colonoscopy Letter  [facility]  [place], [place] [address]   Phone: [phone]  Fax: [phone]      January 29, 2010 MRN: [id number]   SHAREESE MACHA [address] [place], [place]  [address]   Dear Ms. Holberg,   According to your medical record, it is time for you to schedule a Colonoscopy. The American Cancer Society recommends this procedure as a method to detect early colon cancer. Patients with a family history of colon cancer, or a personal history of colon polyps or inflammatory bowel disease are at increased risk.  This letter has been generated based on the recommendations made at the time of your procedure. If you feel that in your particular situation this may no longer apply, please contact our office.  Please call our office at [phone] to schedule this appointment or to update your records at your earliest convenience.  Thank you for cooperating with [place] to provide you with the very best care possible.   Sincerely,  Lowella Bandy. Olevia Perches, M.D.  [facility] [phone]

## 2010-02-15 NOTE — Letter (Signed)
Summary: [facility]   Imported By: Laural Benes 10/16/2009 13:23:33  _____________________________________________________________________  External Attachment:    Type:   Image     Comment:   External Document

## 2010-02-15 NOTE — Letter (Signed)
Summary: [facility]  [facility]   Imported By: Lanelle Bal 02/07/2009 14:25:14  _____________________________________________________________________  External Attachment:    Type:   Image     Comment:   External Document

## 2010-02-15 NOTE — Miscellaneous (Signed)
Summary: mercaptopurine refill  Clinical Lists Changes  Medications: Changed medication from MERCAPTOPURINE 50 MG TABS (MERCAPTOPURINE) Take 1 tablet by mouth once a day to MERCAPTOPURINE 50 MG TABS (MERCAPTOPURINE) Take 1 tablet by mouth once a day. MUST HAVE OFFICE VISIT FOR ANY FURTHER REFILLS! - Signed Rx of MERCAPTOPURINE 50 MG TABS (MERCAPTOPURINE) Take 1 tablet by mouth once a day. MUST HAVE OFFICE VISIT FOR ANY FURTHER REFILLS!;  #90 x 0;  Signed;  Entered by: Awilda Bill CMA;  Authorized by: Lafayette Dragon MD;  Method used: Printed then faxed to [facility]*, , ,   , Ph: [phone], Fax: [phone]    Prescriptions: MERCAPTOPURINE 50 MG TABS (MERCAPTOPURINE) Take 1 tablet by mouth once a day. MUST HAVE OFFICE VISIT FOR ANY FURTHER REFILLS!  #90 x 0   Entered by:   Awilda Bill CMA   Authorized by:   Lafayette Dragon MD   Signed by:   Awilda Bill CMA on 11/20/2007   Method used:   Printed then faxed to ...       [facility] (mail-order)             ,          Ph: [phone]       Fax: [phone]   RxID:   [id number]

## 2010-02-15 NOTE — Progress Notes (Signed)
Summary: 90 day Tramadol rx (fax in inbox) ([facility])  Phone Note Refill Request Message from:  Fax from Pharmacy on February 29, 2008 4:26 PM  Refills Requested: Medication #1:  TRAMADOL HCL 50 MG TABS 1 by mouth three times a day prn Initial call taken by: Daralene Milch,  February 29, 2008 4:26 PM  Follow-up for Phone Call        signed in box.  ok Follow-up by: Owens Loffler MD,  February 29, 2008 4:59 PM

## 2010-02-15 NOTE — Letter (Signed)
Summary: [facility] / SAVELLA, FIBROMYALGIA / DR. Craige [facility] / SAVELLA, FIBROMYALGIA / DR. Hurley Cisco   Imported By: Pilar Grammes 04/07/2008 15:10:20  _____________________________________________________________________  External Attachment:    Type:   Image     Comment:   External Document

## 2010-02-15 NOTE — Progress Notes (Signed)
Summary: flexeril refill  Phone Note Refill Request Message from:  [place] on April 08, 2008 1:03 PM  Refills Requested: Medication #1:  FLEXERIL 10 MG  TABS 2 by mouth at bedtime as needed Initial call taken by: Daralene Milch,  April 08, 2008 1:03 PM  Follow-up for Phone Call        did #180 with 3 refils last june- looks like not due yet  Follow-up by: Allena Earing MD,  April 08, 2008 1:20 PM  Additional Follow-up for Phone Call Additional follow up Details #1::        Rx denial called/faxed to pharmacy Additional Follow-up by: Daralene Milch,  April 08, 2008 2:41 PM

## 2010-02-15 NOTE — Assessment & Plan Note (Signed)
Summary: swelling in legs/bir   Vital Signs:  Patient Profile:   50 Years Old Female Weight:      215 pounds Temp:     98.5 degrees F oral Pulse rate:   84 / minute Pulse rhythm:   regular BP sitting:   120 / 80  (left arm) Cuff size:   large  Vitals Entered By: Marty Heck (January 07, 2007 10:31 AM)                 Chief Complaint:  Swelling all over.  History of Present Illness: wt is up 11 lb since last visit joined wt watchers 5 weeks ago- lost wt and gained it all back is trying to loose again  has been gaining over past week- swelling every single day- feet are really swelling at the end of the day started otc diuretic for past week less exercise since her gym closed work has become much more stressful fibromyalgia has become worse more short of breath with exertion since wt gain no pnd , no orthopnea  is off of her wellbutrin for 2 months- because could not get from [facility] on generic norvasc for a while- no problems  no other changes in med no changes in diet did start using miralax  is watching sugar and salt  needs refil of tramadol   has heart disease and chf in family  Current Allergies (reviewed today): ! ERYTHROMYCIN ASA NSAIDS     Review of Systems      See HPI  General      Denies chills, fatigue, fever, loss of appetite, and weight loss.  CV      Denies chest pain or discomfort, difficulty breathing at night, leg cramps with exertion, palpitations, and shortness of breath with exertion.  Resp      Denies cough, shortness of breath, and wheezing.  GI      Denies bloody stools, change in bowel habits, nausea, and vomiting.  MS      Complains of stiffness.  Derm      Denies changes in color of skin and rash.  Neuro      Denies numbness.  Psych      mood has been ok  Endo      Denies excessive thirst and excessive urination.   Physical Exam  General:     overweight but generally well appearing  Head:  normocephalic, atraumatic, and no abnormalities observed.   Eyes:     vision grossly intact, pupils equal, pupils round, and pupils reactive to light.  no pallor or conj icterus Mouth:     pharynx pink and moist.   Neck:     supple with full rom and no masses or thyromegally, no JVD or carotid bruit  Lungs:     Normal respiratory effort, chest expands symmetrically. Lungs are clear to auscultation, no crackles or wheezes. Heart:     Normal rate and regular rhythm. S1 and S2 normal without gallop, murmur, click, rub or other extra sounds. Abdomen:     Bowel sounds positive,abdomen soft and non-tender without masses, organomegaly or hernias noted. Msk:     no acute joint changes Pulses:     R and L carotid,radial,femoral,dorsalis pedis and posterior tibial pulses are full and equal bilaterally Extremities:     1+ left pedal edema and 1+ right pedal edema.   Neurologic:     sensation intact to light touch and gait normal.   Skin:     Intact  without suspicious lesions or rashes Cervical Nodes:     No lymphadenopathy noted Psych:     nl affect, cheerful and talkative    Impression & Recommendations:  Problem # 1:  EDEMA- LOCALIZED (ICD-782.3) Assessment: Unchanged will check renal prof and bnp if ok- will start diuretic- after med review urged salt avoidance and good lifestyle habits will continue to work on wt loss Orders: Venipuncture (92426) TLB-Renal Function Panel (80069-RENAL) TLB-BNP (B-Natriuretic Peptide) (83880-BNPR)   Problem # 2:  FIBROMYALGIA (ICD-729.1) refilled tramadol some worsening of symptoms with wt gain- enc to continue wt watchers and exercise  Her updated medication list for this problem includes:    Tramadol Hcl 50 Mg Tabs (Tramadol hcl) .Marland Kitchen... 1 by mouth three times a day prn    Cyclobenzaprine Hcl 10 Mg Tabs (Cyclobenzaprine hcl) .Marland Kitchen... 2 hs    Flexeril 10 Mg Tabs (Cyclobenzaprine hcl) .Marland Kitchen... 2 qhs   Complete Medication List: 1)  Norvasc 5 Mg  Tabs (Amlodipine besylate) 2)  Fluticasone Propionate 50 Mcg/act Susp (Fluticasone propionate) 3)  Simvastatin 20 Mg Tabs (Simvastatin) 4)  Mercaptopurine 50 Mg Tabs (Mercaptopurine) 5)  Tramadol Hcl 50 Mg Tabs (Tramadol hcl) .Marland Kitchen.. 1 by mouth three times a day prn 6)  Synthroid 200 Mcg Tabs (Levothyroxine sodium) 7)  Effexor Xr Cp24 (venlafaxine Hcl Cp24), 225 Mg  .... Once daily 8)  Wellbutrin Tabs (bupropion Hcl Tabs), 450 Mg  .... Once daily 9)  Prilosec 20 Mg Cpdr (Omeprazole) .... Two times a day 10)  Synthroid 25 Mcg Tabs (Levothyroxine sodium) .... Take 1 tablet by mouth once a day 11)  Effexor Xr 150 Mg Cp24 (Venlafaxine hcl) .... Take 1 tablet by mouth once a day 12)  Effexor Xr 75 Mg Cp24 (Venlafaxine hcl) .... Take 1 tablet by mouth once a day 13)  Trazodone Hcl 300 Mg Tabs (Trazodone hcl) .... Take 1 tablet by mouth once hs 14)  Trazodone Hcl 150 Mg Tabs (Trazodone hcl) .... Take 1 tablet by mouth once hs 15)  Melatonin 3 Mg Caps (Melatonin) .... At bedtime 16)  Wellbutrin Xl 300 Mg Tb24 (Bupropion hcl) .... Take 1 tablet by mouth once a day 17)  Wellbutrin Sr 150 Mg Tb12 (Bupropion hcl) .... Take 1 tablet by mouth once a day 18)  Adderall 30 Mg Tabs (Amphetamine-dextroamphetamine) .... .qdtab 19)  Adderall 10 Mg Tabs (Amphetamine-dextroamphetamine) .Marland Kitchen.. 1- 2 tabs at lunch 20)  Abilify 10 Mg Tabs (Aripiprazole) .... Take 1 tablet by mouth once a day 21)  Buspar 15 Mg Tabs (Buspirone hcl) .... Take 1 tablet by mouth qid 22)  Macrobid 100 Mg Caps (Nitrofurantoin monohyd macro) .Marland Kitchen.. 1 bid 23)  Cyclobenzaprine Hcl 10 Mg Tabs (Cyclobenzaprine hcl) .... 2 hs 24)  Flexeril 10 Mg Tabs (Cyclobenzaprine hcl) .... 2 qhs   Patient Instructions: 1)  call and let me know the name of the medication you were referring to 2)  watch salt and sugar intake and keep up exercise and water intake 3)  we are checking labs today- and will update you 4)  if needed- we will start diuretic (do not take  over the counter) 5)  if short of breath or chest pain please update me    Prescriptions: TRAMADOL HCL 50 MG TABS (TRAMADOL HCL) 1 by mouth three times a day prn  #90 x 3   Entered and Authorized by:   Allena Earing MD   Signed by:   Allena Earing MD on 01/07/2007  Method used:   Print then Give to Patient   RxID:   [id number]  ] Prior Medications: NORVASC 5 MG TABS (AMLODIPINE BESYLATE)  FLUTICASONE PROPIONATE 50 MCG/ACT SUSP (FLUTICASONE PROPIONATE)  SIMVASTATIN 20 MG TABS (SIMVASTATIN)  MERCAPTOPURINE 50 MG TABS (MERCAPTOPURINE)  TRAMADOL HCL 50 MG TABS (TRAMADOL HCL) 1 by mouth three times a day prn SYNTHROID 200 MCG TABS (LEVOTHYROXINE SODIUM)  EFFEXOR XR   CP24 (VENLAFAXINE HCL CP24), 225 MG () once daily WELLBUTRIN   TABS (BUPROPION HCL TABS), 450 MG () once daily PRILOSEC 20 MG  CPDR (OMEPRAZOLE) two times a day SYNTHROID 25 MCG  TABS (LEVOTHYROXINE SODIUM) Take 1 tablet by mouth once a day EFFEXOR XR 150 MG  CP24 (VENLAFAXINE HCL) Take 1 tablet by mouth once a day EFFEXOR XR 75 MG  CP24 (VENLAFAXINE HCL) Take 1 tablet by mouth once a day TRAZODONE HCL 300 MG  TABS (TRAZODONE HCL) Take 1 tablet by mouth once hs TRAZODONE HCL 150 MG  TABS (TRAZODONE HCL) Take 1 tablet by mouth once hs MELATONIN 3 MG  CAPS (MELATONIN) at bedtime WELLBUTRIN XL 300 MG  TB24 (BUPROPION HCL) Take 1 tablet by mouth once a day WELLBUTRIN SR 150 MG  TB12 (BUPROPION HCL) Take 1 tablet by mouth once a day ADDERALL 30 MG  TABS (AMPHETAMINE-DEXTROAMPHETAMINE) .qdtab ADDERALL 10 MG  TABS (AMPHETAMINE-DEXTROAMPHETAMINE) 1- 2 tabs at lunch ABILIFY 10 MG  TABS (ARIPIPRAZOLE) Take 1 tablet by mouth once a day BUSPAR 15 MG  TABS (BUSPIRONE HCL) Take 1 tablet by mouth qid MACROBID 100 MG  CAPS (NITROFURANTOIN MONOHYD MACRO) 1 bid CYCLOBENZAPRINE HCL 10 MG  TABS (CYCLOBENZAPRINE HCL) 2 hs FLEXERIL 10 MG  TABS (CYCLOBENZAPRINE HCL) 2 qhs Current Allergies (reviewed today): !  ERYTHROMYCIN ASA NSAIDS

## 2010-02-15 NOTE — Assessment & Plan Note (Signed)
Summary: ?UTI/CLE  Medications Added NORVASC 5 MG TABS (AMLODIPINE BESYLATE)  CYCLOBENZAPRINE HCL 10 MG TABS (CYCLOBENZAPRINE HCL)  FLUTICASONE PROPIONATE 50 MCG/ACT SUSP (FLUTICASONE PROPIONATE)  SIMVASTATIN 20 MG TABS (SIMVASTATIN)  MERCAPTOPURINE 50 MG TABS (MERCAPTOPURINE)  TRAMADOL HCL 50 MG TABS (TRAMADOL HCL)  SYNTHROID 200 MCG TABS (LEVOTHYROXINE SODIUM)  * EFFEXOR XR   CP24 (VENLAFAXINE HCL CP24), 225 MG once daily * WELLBUTRIN   TABS (BUPROPION HCL TABS), 450 MG once daily PRILOSEC 20 MG  CPDR (OMEPRAZOLE) two times a day CIPRO 500 MG  TABS (CIPROFLOXACIN HCL) 1 bid      Allergies Added: NKDA  Vital Signs:  Patient Profile:   50 Years Old Female Weight:      188 pounds Temp:     99.2 degrees F oral Pulse rate:   102 / minute BP sitting:   124 / 97  (right arm) Cuff size:   ?regular  Vitals Entered By: Cooper Render (July 22, 2006 4:09 PM)               Chief Complaint:  ?uti.  History of Present Illness: Here due to dysuria x 3 days.  Taking pyridium 2days.  Nocturia x2.  Hx of UTIs, just finished period.  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    Review of Systems      See HPI   Physical Exam  General:     alert, well-developed, well-nourished, well-hydrated, and overweight-appearing.   Head:     normocephalic.   Eyes:     no injection.   Lungs:     normal respiratory effort.   Abdomen:     no CVA tenderness Neurologic:     alert & oriented X3 and gait normal.   Skin:     turgor normal and color normal.   Psych:     normally interactive.      Impression & Recommendations:  Problem # 1:  U T I (ICD-599.0)  Her updated medication list for this problem includes:    Cipro 500 Mg Tabs (Ciprofloxacin hcl) .Marland Kitchen... 1 bid  Orders: UA Dipstick W/ Micro (81000)   Medications Added to Medication List This  Visit: 1)  Norvasc 5 Mg Tabs (Amlodipine besylate) 2)  Cyclobenzaprine Hcl 10 Mg Tabs (Cyclobenzaprine hcl) 3)  Fluticasone Propionate 50 Mcg/act Susp (Fluticasone propionate) 4)  Simvastatin 20 Mg Tabs (Simvastatin) 5)  Mercaptopurine 50 Mg Tabs (Mercaptopurine) 6)  Tramadol Hcl 50 Mg Tabs (Tramadol hcl) 7)  Synthroid 200 Mcg Tabs (Levothyroxine sodium) 8)  Effexor Xr Cp24 (venlafaxine Hcl Cp24), 225 Mg  .... Once daily 9)  Wellbutrin Tabs (bupropion Hcl Tabs), 450 Mg  .... Once daily 10)  Prilosec 20 Mg Cpdr (Omeprazole) .... Two times a day 11)  Cipro 500 Mg Tabs (Ciprofloxacin hcl) .Marland Kitchen.. 1 bid   Patient Instructions: 1)  cipro 1 two times a day--take all 2)  discussed hygeine 3)  increase water, limit sodas 4)  It is important to use your inhaler properly. Use a spacer, take slow deep breaths and hold them. Rinse your mouth after using.     Prescriptions: CIPRO 500 MG  TABS (CIPROFLOXACIN HCL) 1 bid  #14 x 0   Entered and Authorized by:   Gildardo Griffes FNP   Signed by:   Gildardo Griffes FNP on 07/22/2006   Method  used:   Print then Give to Patient   RxID:   [id number]      Laboratory Results   Urine Tests  Date/Time Recieved: 07/22/06 Date/Time Reported: 07/22/06  Routine Urinalysis   Glucose: negative   (Normal Range: Negative) Bilirubin: negative   (Normal Range: Negative) Ketone: negative   (Normal Range: Negative) Spec. Gravity: 1.015   (Normal Range: 1.003-1.035) Blood: moderate   (Normal Range: Negative) pH: 7.0   (Normal Range: 5.0-8.0) Protein: negative   (Normal Range: Negative) Urobilinogen: negative   (Normal Range: 0-1) Nitrite: negative   (Normal Range: Negative) Leukocyte Esterace: large   (Normal Range: Negative)  Urine Microscopic WBC/hpf: 5-8 RBC/hpf: 0-1 Bacteria: +++ Epithelial: rare

## 2010-02-15 NOTE — Assessment & Plan Note (Signed)
Summary: FATIGUE/CLE   Vital Signs:  Patient profile:   50 year old female Height:      65.25 inches Weight:      213.75 pounds BMI:     35.43 Temp:     98.7 degrees F oral Pulse rate:   76 / minute Pulse rhythm:   regular BP sitting:   120 / 80  (left arm) Cuff size:   large  Vitals Entered By: Ozzie Hoyle LPN (August 09, 4968 2:63 PM) CC: fatigue   History of Present Illness: is having a lot of fatigue  is actually different from the fibromyalgia   is sleeping a lot when she can  hard to keep her eyes open  does not snore  had sleep apnea workup - and was not a problem  wakes up tired  not really feeling depressed    Dr Dwyane Dee cares for her hypothyroidism  about time to re check this - he had to increase her dose last time  ? when her appt is   due to check vit D-- was in nov  is taking ca with D otc -- 2 pills per day- but not always compliant  out in the sun a lot   thinks she is in menopause  periods are starting to get heavier  some hot flashes and night sweats   no changes in med  occ takes some tylenol  fibo is about the same  has been walking 2-3 times per week - and likes to swim   wt is down over 10 lb is working hard with that - avoiding sweets more than before   needs to change    Allergies: 1)  ! Erythromycin 2)  ! * Ephedrine 3)  ! * Coconut 4)  ! 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) HYPERCHOLESTEROLEMIA (ICD-272.0) MIGRAINES, HX OF (ICD-V13.8) COLITIS, ULCERATIVE (ICD-556.9) HYPOTHYROIDISM (ICD-244.9) HYPERTENSION (ICD-401.9) GERD (ICD-530.81) DEPRESSION (ICD-311) ASTHMA (ICD-493.90) ANXIETY (ICD-300.00) ALLERGIC RHINITIS  (ICD-477.9) FIBROMYALGIA (ICD-729.1) INSOMNIA (ICD-780.52) DYSURIA (ICD-788.1) acne hx of ovarian cysts    GI-- Dr Olevia Perches gyn-- Dr Laurey Morale derm---Dr Nicole Kindred psych-- Dr Toy Care  endo--Dr Dwyane Dee  rheum- Dr Charlestine Night  Past Surgical History: Last updated: 08/23/2008 [place] teeth removed (7858'I) Tonsillectomy- (1984) Caesarean section- placenta previa, gest DM, pre- eclampsia (1996/1997) Sinus surgery (05/1997) Pre cancerous mole removed Cholecystectomy, adhesions also (1997) Meckel's diverticulum- surgery (1999) Breast biopsy- on tamoxifen, atypical hyperplasia Dexa- normal (04/1999), dexa nl 2010 Colonoscopy- ulcerative colitis (01/2001) EGD- polyp (09/20023) Exercise stress test- neg (11/2003)   nuclear stress test neg 6/09 Colonoscopy- UC, polyp (04/2004) colonoscopy ulcerative colitis, no polyps 12/08 (11/09) sleep study - no apnea, but did snore (done by [facility])  Family History: Last updated: July 07, 2007 father CAD, died of colon cancer, ALZ Mother CAD, HTN, OP great aunts had breast cancer  uncle with CAD and AAA cousin with CVA some remote DM 2   Social History: Last updated: 06/22/2008 Marital Status: Married Children: 2--11-12 Occupation: Buyer, 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 Eyes:  Denies blurring and eye irritation. CV:  Denies chest pain or discomfort,  lightheadness, palpitations, and shortness of breath with exertion. Resp:  Denies cough, shortness of breath, and wheezing. GI:  Denies abdominal pain, change in bowel habits, indigestion, and nausea. GU:  Denies dysuria, hematuria, and urinary frequency. MS:  Complains of muscle aches and stiffness; denies cramps and muscle weakness. Derm:  Denies lesion(s), poor wound healing, and rash. Neuro:  Denies  numbness and tingling. Psych:  mood has been fairly good . Endo:  Denies excessive thirst and excessive urination. Heme:  Denies abnormal bruising and bleeding.  Physical Exam  General:  overweight but generally well appearing  Head:  normocephalic, atraumatic, and no abnormalities observed.   Eyes:  vision grossly intact, pupils equal, pupils round, and pupils reactive to light.  no conjunctival pallor, injection or icterus  Ears:  R ear normal and L ear normal.  - scant cerumen Nose:  no nasal discharge.   Mouth:  pharynx pink and moist.   Neck:  supple with full rom and no masses or thyromegally, no JVD or carotid bruit  Chest Wall:  No deformities, masses, or tenderness noted. Lungs:  Normal respiratory effort, chest expands symmetrically. Lungs are clear to auscultation, no crackles or wheezes. Heart:  Normal rate and regular rhythm. S1 and S2 normal without gallop, murmur, click, rub or other extra sounds. Abdomen:  Bowel sounds positive,abdomen soft and non-tender without masses, organomegaly or hernias noted. no renal bruits  Msk:  No deformity or scoliosis noted of thoracic or lumbar spine.  no acute joint changes  Pulses:  R and L carotid,radial,femoral,dorsalis pedis and posterior tibial pulses are full and equal bilaterally Extremities:  trace left pedal edema and trace right pedal edema.   Neurologic:  sensation intact to light touch, gait normal, and DTRs symmetrical and normal.   Skin:  Intact without suspicious lesions or rashes Cervical Nodes:  No lymphadenopathy noted Inguinal Nodes:  No significant adenopathy Psych:  normal affect, talkative and pleasant    Impression & Recommendations:  Problem # 1:  FATIGUE (ICD-780.79) Assessment New with some inc in stress and menop changes  disc differential with her lab today thyroid lab to her endo  f/u sept as planned Orders: Venipuncture (76734) Specimen Handling (99000) TLB-CMP (Comprehensive Metabolic Pnl)  (19379-KWIO) TLB-CBC Platelet - w/Differential (85025-CBCD) TLB-TSH (Thyroid Stimulating Hormone) (84443-TSH) TLB-T3, Free (Triiodothyronine) (84481-T3FREE) TLB-T4 (Thyrox), Free [phone]) T-Vitamin D (25-Hydroxy) [phone]) Prescription Created Electronically [phone])  Problem # 2:  HYPERCHOLESTEROLEMIA (ICD-272.0) Assessment: Deteriorated  change simvastatin to lipitor due to new guidelines and interaction with norvasc  trial of lipitor and update  low sat fat diet  lab in 6 wk Her updated medication list for this problem includes:    Simvastatin 40 Mg Tabs (Simvastatin) .Marland Kitchen... Take 1/2 tab by mouth once daily    Lipitor 20 Mg Tabs (Atorvastatin calcium) .Marland Kitchen... 1 by mouth once daily  Orders: Prescription Created Electronically [phone])  Complete Medication List: 1)  Norvasc 5 Mg Tabs (Amlodipine besylate) .Marland Kitchen.. 1 by mouth two times a day 2)  Fluticasone Propionate 50 Mcg/act Susp (Fluticasone propionate) .... 2 sprays in each nostril once daily 3)  Tramadol Hcl 50 Mg Tabs (Tramadol hcl) .... Take one tablet by mouth in am, one tablet by mouth at lunch and take two tablets by mouth at bedtime. 4)  Wellbutrin Xl 150 Mg Tb24 (Bupropion hcl) .... 3 by mouth qd 5)  Venlafaxine Hcl 75 Mg Tabs (Venlafaxine hcl) .... Take 1 tablet by mouth twice a day 6)  Trazodone Hcl 150 Mg Tabs (  Trazodone hcl) .... One by mouth at bedtime 7)  Abilify 10 Mg Tabs (Aripiprazole) .... Take 1 tablet by mouth once a day 8)  Buspar 15 Mg Tabs (Buspirone hcl) .... Take one tablet by mouth at bedtime, may repeat x 1 as needed and as needed anxiety 9)  Flexeril 10 Mg Tabs (Cyclobenzaprine hcl) .... 2 by mouth at bedtime 10)  Frova 2.5 Mg Tabs (Frovatriptan succinate) .... Take 1-2 as needed for migraines 11)  Chlorpromazine Hcl 25 Mg Tabs (Chlorpromazine hcl) .... Take 1-2 three times a day as needed for migraines if frova doesnt help 12)  Caltrate 600+d 600-400 Mg-unit Tabs (Calcium carbonate-vitamin d) ....  Take one by mouth twice a day 13)  Protonix 40 Mg Tbec (Pantoprazole sodium) .... Take 1 tablet by mouth once a day. 14)  Macrodantin  .... As needed with sexual activity as directed 15)  Valtrex 500 Mg Tabs (Valacyclovir hcl) .Marland Kitchen.. 1 tablet by mouth twice daily 16)  Dulcolax 5 Mg Tbec (Bisacodyl) .Marland Kitchen.. 1 at bedtime 17)  Amitriptyline Hcl 50 Mg Tabs (Amitriptyline hcl) .... One tab by mouth at bedtime 18)  Multivitamins Tabs (Multiple vitamin) .... Take 1 tablet by mouth once a day 19)  Phazyme 125 Mg Chew (Simethicone) .... One by mouth three times a day 20)  Spironolactone 25 Mg Tabs (Spironolactone) .Marland Kitchen.. 1 by mouth twice daily 21)  Vitamin D 2000 Units  .... Take by mouth daily 22)  Synthroid 125 Mcg Tabs (Levothyroxine sodium) .... Take two tablets by mouth once daily 23)  Adderall 20 Mg Tabs (Amphetamine-dextroamphetamine) .... Take one tablet three times a day 24)  Simvastatin 40 Mg Tabs (Simvastatin) .... Take 1/2 tab by mouth once daily 25)  Solodyn 65 Mg Xr24h-tab (Minocycline hcl) .... By Katherina Mires as needed for acne 26)  Ex-lax 15 Mg Tabs (Sennosides) .... Otc as directed. 27)  Lipitor 20 Mg Tabs (Atorvastatin calcium) .Marland Kitchen.. 1 by mouth once daily  Patient Instructions: 1)  switch to lipitor 20 mg once daily  2)  if any side effects or problems - let me know  3)  schedule fasting labs in 6 weeks lipid/ast/alt 272  4)  labs today for fatigue and thyroid  Prescriptions: LIPITOR 20 MG TABS (ATORVASTATIN CALCIUM) 1 by mouth once daily  #30 x 5   Entered and Authorized by:   Allena Earing MD   Signed by:   Allena Earing MD on 08/09/2009   Method used:   Electronically to        [facility]. [phone]* (retail)       [address]       [place], [place]  [address]       Ph: [phone]       Fax: [phone]   RxID:   [id number]   Current Allergies (reviewed today): ! ERYTHROMYCIN ! * EPHEDRINE ! * COCONUT ! CRESTOR ASA NSAIDS

## 2010-02-15 NOTE — Procedures (Signed)
Summary: Gastroenterology EGD  Gastroenterology EGD   Imported By: Darcey Nora RN 01/26/2007 13:17:09  _____________________________________________________________________  External Attachment:    Type:   Image     Comment:   External Document

## 2010-02-15 NOTE — Progress Notes (Signed)
Summary: Drug interaction monograph for Simvastatin and Amlodipine  Phone Note From Pharmacy   Caller: [facility] Call For: Dr [name]  Summary of Call: [facility] faxed Drug interaction monograph for Simvastatin and Amlodipine. Sheet is on your shelf in the in box.  Initial call taken by: Ozzie Hoyle LPN,  July 18, 8248 53:97 AM  Follow-up for Phone Call        please let pt know there are some new guidelines for zocor/ simvastatin - is no longer recommended with amlodipine so we have to change it  please ask her to find out which statin chol meds are covered by her ins and let me know so we can have a switch Follow-up by: Allena Earing MD,  July 18, 2009 11:46 AM  Additional Follow-up for Phone Call Additional follow up Details #1::        Patient notified as instructed by telephone. Pt will check on and call our office back.Ozzie Hoyle LPN  July 18, 6732 19:37 AM   Left message for patient to call back. Ozzie Hoyle LPN  July 22, 9022 09:73 AM   Left message for patient to call back. Ozzie Hoyle LPN  July 26, 5327 9:24 AM     Additional Follow-up for Phone Call Additional follow up Details #2::    Sent pt a letter regarding pt contacting her insurance co. about statin cholesterol meds that are covered under her insurance.Ozzie Hoyle LPN  July 28, 2681 4:19 PM   thanks for doing that - will wait for reply Follow-up by: Allena Earing MD,  August 07, 2009 1:28 PM  Additional Follow-up for Phone Call Additional follow up Details #3:: Details for Additional Follow-up Action Taken: Patient notified as instructed by telephone. Pt still has been busy and on vacation and has  not contacted the insurance co yet . Pt said she was not at home right now but would try to call me back tomorrow 08/08/09 with information.Ozzie Hoyle LPN  August 07, 6220 9:79 PM   Pt called and she has appt with Dr Glori Bickers tomorrow 08/09/09 at 3pm and will bring info Dr Glori Bickers requested with her.Ozzie Hoyle LPN  August 09, 8919 1:94 PM   will see her then , thanks Additional Follow-up by: Allena Earing MD,  August 08, 2009 4:55 PM

## 2010-02-15 NOTE — Assessment & Plan Note (Signed)
Summary: MED REFILLS   Vital Signs:  Patient Profile:   50 Years Old Female Weight:      206 pounds Temp:     98.7 degrees F oral Pulse rate:   109 / minute BP sitting:   152 / 99  (left arm) Cuff size:   large  Vitals Entered By: Glenna Durand (October 30, 2006 2:17 PM)                 Chief Complaint:  med refill.  History of Present Illness: Here for discussion of refill of Flexeril 20 at bedtime for sleep.  Has dx of fibromyalgia since 02/1998 and needs this along with Trazodone, melatonin to get restful sleep.  To see Dr. Sima Matas at the [facility] in [place] for sleep---in the next few wks.  Current Allergies: No known allergies      Review of Systems      See HPI   Physical Exam  General:     alert, well-developed, and well-nourished.  obese, NAD Head:     normocephalic, atraumatic, and no abnormalities observed.   Lungs:     normal respiratory effort.   Neurologic:     alert & oriented X3 and gait normal.   Skin:     turgor normal, color normal, and no rashes.   Psych:     normally interactive.      Impression & Recommendations:  Problem # 1:  INSOMNIA (ICD-780.52) Assessment: Unchanged will refill her generic Flexeril 20 mg at bedtime encouraged to see sleep specialist as scheduled see back as needed   Problem # 2:  FIBROMYALGIA (ICD-729.1) Assessment: Unchanged  Her updated medication list for this problem includes:    Tramadol Hcl 50 Mg Tabs (Tramadol hcl)    Cyclobenzaprine Hcl 10 Mg Tabs (Cyclobenzaprine hcl) .Marland Kitchen... 2 hs    Flexeril 10 Mg Tabs (Cyclobenzaprine hcl) .Marland Kitchen... 2 qhs   Complete Medication List: 1)  Norvasc 5 Mg Tabs (Amlodipine besylate) 2)  Fluticasone Propionate 50 Mcg/act Susp (Fluticasone propionate) 3)  Simvastatin 20 Mg Tabs (Simvastatin) 4)  Mercaptopurine 50 Mg Tabs (Mercaptopurine) 5)  Tramadol Hcl 50 Mg Tabs (Tramadol hcl) 6)  Synthroid 200 Mcg Tabs (Levothyroxine sodium) 7)  Effexor Xr Cp24 (venlafaxine Hcl  Cp24), 225 Mg  .... Once daily 8)  Wellbutrin Tabs (bupropion Hcl Tabs), 450 Mg  .... Once daily 9)  Prilosec 20 Mg Cpdr (Omeprazole) .... Two times a day 10)  Synthroid 25 Mcg Tabs (Levothyroxine sodium) .... Take 1 tablet by mouth once a day 11)  Effexor Xr 150 Mg Cp24 (Venlafaxine hcl) .... Take 1 tablet by mouth once a day 12)  Effexor Xr 75 Mg Cp24 (Venlafaxine hcl) .... Take 1 tablet by mouth once a day 13)  Trazodone Hcl 300 Mg Tabs (Trazodone hcl) .... Take 1 tablet by mouth once hs 14)  Trazodone Hcl 150 Mg Tabs (Trazodone hcl) .... Take 1 tablet by mouth once hs 15)  Melatonin 3 Mg Caps (Melatonin) .... At bedtime 16)  Wellbutrin Xl 300 Mg Tb24 (Bupropion hcl) .... Take 1 tablet by mouth once a day 17)  Wellbutrin Sr 150 Mg Tb12 (Bupropion hcl) .... Take 1 tablet by mouth once a day 18)  Adderall 30 Mg Tabs (Amphetamine-dextroamphetamine) .... .qdtab 19)  Adderall 10 Mg Tabs (Amphetamine-dextroamphetamine) .Marland Kitchen.. 1- 2 tabs at lunch 20)  Abilify 10 Mg Tabs (Aripiprazole) .... Take 1 tablet by mouth once a day 21)  Buspar 15 Mg Tabs (Buspirone hcl) .... Take  1 tablet by mouth qid 22)  Macrobid 100 Mg Caps (Nitrofurantoin monohyd macro) .Marland Kitchen.. 1 bid 23)  Cyclobenzaprine Hcl 10 Mg Tabs (Cyclobenzaprine hcl) .... 2 hs 24)  Flexeril 10 Mg Tabs (Cyclobenzaprine hcl) .... 2 qhs     Prescriptions: FLEXERIL 10 MG  TABS (CYCLOBENZAPRINE HCL) 2 qhs  #60 x 6   Entered and Authorized by:   Dixie Dials FNP   Signed by:   Dixie Dials FNP on 10/30/2006   Method used:   Print then Give to Patient   RxID:   [id number]  ]  Influenza Vaccine    Vaccine Type: Fluvax 3+

## 2010-02-15 NOTE — Miscellaneous (Signed)
Summary: med list update  Medications Added * VITAMIN D 2000 UNITS take by mouth daily       Clinical Lists Changes  Medications: Added new medication of * VITAMIN D 2000 UNITS take by mouth daily     Prior Medications: NORVASC 5 MG TABS (AMLODIPINE BESYLATE) 1 by mouth two times a day FLUTICASONE PROPIONATE 50 MCG/ACT SUSP (FLUTICASONE PROPIONATE) 2 sprays in each nostril once daily SIMVASTATIN 20 MG TABS (SIMVASTATIN) 1 by mouth once daily TRAMADOL HCL 50 MG TABS (TRAMADOL HCL) 1-2 by mouth three times a day as needed SYNTHROID 112 MCG TABS (LEVOTHYROXINE SODIUM) two tablets by mouth once daily WELLBUTRIN XL 150 MG  TB24 (BUPROPION HCL) 3 by mouth qd VENLAFAXINE HCL 75 MG TABS (VENLAFAXINE HCL) Take 1 tablet by mouth once a day TRAZODONE HCL 150 MG  TABS (TRAZODONE HCL) one by mouth at bedtime ADDERALL XR 30 MG XR24H-CAP (AMPHETAMINE-DEXTROAMPHETAMINE) Take 1 capsule by mouth two times a day ABILIFY 10 MG  TABS (ARIPIPRAZOLE) Take 1 tablet by mouth once a day BUSPAR 15 MG  TABS (BUSPIRONE HCL) Take one tablet by mouth at bedtime, may repeat x 1 as needed and as needed anxiety FLEXERIL 10 MG  TABS (CYCLOBENZAPRINE HCL) 2 by mouth at bedtime as needed POTASSIUM CHLORIDE CR 10 MEQ  CPCR (POTASSIUM CHLORIDE) take one by mouth once daily (HOLDING 6/10) DARVOCET A500 100-500 MG  TABS (PROPOXYPHENE N-APAP) 1 by mouth q 6 hours as needed pain FROVA 2.5 MG  TABS (FROVATRIPTAN SUCCINATE) take 1-2 as needed for migraines CHLORPROMAZINE HCL 25 MG  TABS (CHLORPROMAZINE HCL) take 1-2 three times a day as needed for migraines if frova doesnt help CALTRATE 600+D 600-400 MG-UNIT  TABS (CALCIUM CARBONATE-VITAMIN D) take one by mouth twice a day PROTONIX 40 MG  TBEC (PANTOPRAZOLE SODIUM) Take 1 tablet by mouth once a day. TYLENOL ARTHRITIS PAIN 650 MG  TBCR (ACETAMINOPHEN) take 2 by mouth twice a day MACRODANTIN () as needed with sexual activity as directed VALTREX 500 MG TABS (VALACYCLOVIR HCL) 1  tablet by mouth twice daily DULCOLAX 5 MG TBEC (BISACODYL) 3 at bedtime AMITRIPTYLINE HCL 50 MG TABS (AMITRIPTYLINE HCL) one tab by mouth at bedtime SOLODYN 90 MG XR24H-TAB (MINOCYCLINE HCL) by mouth as needed acne MULTIVITAMINS   TABS (MULTIPLE VITAMIN) Take 1 tablet by mouth once a day PHAZYME 125 MG CHEW (SIMETHICONE) one by mouth three times a day SPIRONOLACTONE 25 MG TABS (SPIRONOLACTONE) 1 by mouth once daily VITAMIN D (ERGOCALCIFEROL) 50000 UNIT CAPS (ERGOCALCIFEROL) take once a week for 10 weeks Current Allergies: ! ERYTHROMYCIN ! * EPHEDRINE ! * COCONUT ASA NSAIDS

## 2010-02-15 NOTE — Progress Notes (Signed)
Summary: wants labs  Phone Note Call from Patient Call back at Home Phone [phone]   Caller: Patient Summary of Call: Pt is due to have labs drawn for chol and lipids and she is asking if she can have a thyroid panel and vit d added.  Please advise.  She had recent vit d but she says she has felt tired lately. Initial call taken by: Marty Heck CMA,  November 16, 2008 8:47 AM  Follow-up for Phone Call        please add tsh , free T4 and T3 uptake vit D level 244.9 780.79 send to endocrine- Dr Dwyane Dee when back Follow-up by: Allena Earing MD,  November 16, 2008 1:22 PM  Additional Follow-up for Phone Call Additional follow up Details #1::        .Added to lab order with instructions to send results to Dr. Dwyane Dee Additional Follow-up by: Christena Deem CMA ([place]),  November 17, 2008 10:02 AM     Appended Document: wants labs I thought the patient already had a lab appt. and I was going to add these tests to that order.  There is no appt scheduled.  I phoned the patient and she states she will call for the lab appt. when she is able to look at her work schedule.

## 2010-02-15 NOTE — Assessment & Plan Note (Signed)
Summary: DISCUSS MEDICATION/CLE   Vital Signs:  Patient Profile:   50 Years Old Female Height:     65.25 inches Weight:      215.75 pounds BMI:     35.76 Temp:     98.2 degrees F oral Pulse rate:   88 / minute Pulse rhythm:   regular BP sitting:   140 / 90  (left arm) Cuff size:   large  Vitals Entered By: Daralene Milch (March 18, 2008 8:58 AM)                 PCP:  [name], MD  Chief Complaint:  discuss medications, refill flonase, and darvocet.Marland Kitchen  History of Present Illness: wt is up about 7 lb with bmi 35  wants to try savella for fibromyalgia  disc with psych- who thought it was a good idea  she said she would need to cut her wellbutrin before she started it   is already on amitriptyline , and effexor and wellbutrin , and buspar ? poss interaction  has more pain from the fibromyalgia  did fine out vit D level was low- so went on 4 week high dose tx - and that improved  needs f/u with Dr Charlestine Night - for fibro   does not want to try neurontin or lyrica -- too worried about side   is a glaucoma suspect- but her opthy said it was ok   has been too tired to exercise - overall (feels some better when she does it ) is very sore afterwards  diet - is terrible , is on wt watchers- but not doing the program  wants to eat more healthy foods -- is generally unmotivated in the winter  has gained some wt   thinks her depression is in good control without anx , and sleep is great   needs refil on darvocet and flonase all worse with dry heat       Current Allergies (reviewed today): ! ERYTHROMYCIN ! * EPHEDRINE ASA NSAIDS  Past Medical History:    Reviewed history from 06/18/2007 and no changes required:       Current Problems:        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)       HIATAL HERNIA (ICD-553.3)       COLONIC POLYPS, ADENOMATOUS, HX OF (ICD-V12.72)       ADENOCARCINOMA, COLON, FAMILY HX (ICD-V16.0)       EDEMA- LOCALIZED  (ICD-782.3)       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)       HYPERCHOLESTEROLEMIA (ICD-272.0)       MIGRAINES, HX OF (ICD-V13.8)       COLITIS, ULCERATIVE (ICD-556.9)       HYPOTHYROIDISM (ICD-244.9)       HYPERTENSION (ICD-401.9)       GERD (ICD-530.81)       DEPRESSION (ICD-311)       ASTHMA (ICD-493.90)       ANXIETY (ICD-300.00)       ALLERGIC RHINITIS (ICD-477.9)       FIBROMYALGIA (ICD-729.1)       INSOMNIA (ICD-780.52)       DYSURIA (ICD-788.1)                     GI-- Dr Olevia Perches       gyn-- Dr Laurey Morale       derm---Dr Nicole Kindred       psych-- Dr Toy Care        endo--Dr Dwyane Dee  rheum- Dr Charlestine Night                Past Surgical History:    Reviewed history from 12/07/2007 and no changes required:       Hospital- wisom teeth removed (1980's)       Tonsillectomy- (1984)       Caesarean section- placenta previa, gest DM, pre- eclampsia (1996/1997)       Sinus surgery (05/1997)       Pre cancerous mole removed       Cholecystectomy, adhesions also (1997)       Meckel's diverticulum- surgery (1999)       Breast biopsy- on tamoxifen, atypical hyperplasia       Dexa- normal (04/1999)       Colonoscopy- ulcerative colitis (01/2001)       EGD- polyp (09/20023)       Exercise stress test- neg (11/2003)   nuclear stress test neg 6/09       Colonoscopy- UC, polyp (04/2004)       colonoscopy ulcerative colitis, no polyps 12/08       (11/09) sleep study - no apnea, but did snore (done by [facility])   Family History:    Reviewed history from 06/18/2007 and no changes required:       father CAD, died of colon cancer, [facility]       Mother CAD, HTN, OP       great aunts had breast cancer        uncle with CAD and AAA       cousin with CVA       some remote DM 2          Social History:    Reviewed history from 06/18/2007 and no changes required:       Marital Status: Married       Children: 2--11-12       Occupation: Buyer, 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      Denies fever, loss of appetite, malaise, and weight loss.  Eyes      Denies blurring and eye irritation.  CV      Denies chest pain or discomfort, palpitations, and swelling of feet.  Resp      Denies cough and wheezing.  GI      Denies change in bowel habits.  MS      Complains of joint pain, muscle aches, cramps, and stiffness.      Denies joint redness and joint swelling.  Derm      Denies itching and rash.  Neuro      Complains of tingling.      Denies numbness.  Psych      mood has been very good   Endo      Denies cold intolerance and heat intolerance.      thyroid has been stable   Physical Exam  General:     overwt with recent wt gain, and well appearing Head:     normocephalic, atraumatic, and no abnormalities observed.   Eyes:     vision grossly intact, pupils equal, pupils round, and pupils reactive to light.  no conjunctival pallor, injection or icterus  Nose:     no nasal discharge.   Mouth:  pharynx pink and moist.   Neck:     supple with full rom and no masses or thyromegally, no JVD or carotid bruit  Chest Wall:     No deformities, masses, or tenderness noted. Lungs:     Clear throughout to auscultation. Heart:     Regular rate and rhythm; no murmurs, rubs or bruits. Abdomen:     soft and non-tender.   Msk:     no kyphosis trigger points noted - back and shoulders  Pulses:     R and L carotid,radial,femoral,dorsalis pedis and posterior tibial pulses are full and equal bilaterally Extremities:     No clubbing, cyanosis, edema, or deformity noted with normal full range of motion of all joints.   Neurologic:     sensation intact to light touch, gait normal, and DTRs symmetrical and normal.   Skin:     Intact without suspicious lesions or rashes- fair complexion with lentigos Cervical Nodes:     No lymphadenopathy noted Psych:     normal affect,  talkative and pleasant     Impression & Recommendations:  Problem # 1:  FIBROMYALGIA (ICD-729.1) Assessment: Deteriorated generally worse lately, also with wt gain I do not feel comfortable starting pt on savella- due to cross over with other antidep/ and meds that already aff seretonin and norepi pt will disc furtrher with her rheum and psych disc lifestyle pt not interested in lyrica at this time  refilled darvocet which she uses sparingly Her updated medication list for this problem includes:    Tramadol Hcl 50 Mg Tabs (Tramadol hcl) .Marland Kitchen... 1 by mouth three times a day prn    Flexeril 10 Mg Tabs (Cyclobenzaprine hcl) .Marland Kitchen... 2 by mouth at bedtime as needed    Darvocet A500 100-500 Mg Tabs (Propoxyphene n-apap) .Marland Kitchen... 1 by mouth q 6 hours as needed pain    Tylenol Arthritis Pain 650 Mg Tbcr (Acetaminophen) .Marland Kitchen... Take 2 by mouth twice a day   Problem # 2:  UNSPECIFIED VITAMIN D DEFICIENCY (ICD-268.9) Assessment: New tx with weekly vit d high dose - will have f/u labs soon  adv pt this may help fibro symptoms  Problem # 3:  ALLERGIC RHINITIS (ICD-477.9) Assessment: Unchanged refil flonase - doing well on this Her updated medication list for this problem includes:    Fluticasone Propionate 50 Mcg/act Susp (Fluticasone propionate) .Marland Kitchen... 2 sprays in each nostril once daily   Problem # 4:  HYPERTENSION (ICD-401.9) Assessment: Unchanged stable on current tx Her updated medication list for this problem includes:    Norvasc 5 Mg Tabs (Amlodipine besylate) .Marland Kitchen... 1 by mouth two times a day    Hydrochlorothiazide 25 Mg Tabs (Hydrochlorothiazide) .Marland Kitchen... Take one by mouth once a day  BP today: 140/90- re check 130/85 Prior BP: 132/88 (01/20/2008)  Labs Reviewed: Creat: 0.9 (06/22/2007) Chol: 191 (06/22/2007)   HDL: 50.3 (06/22/2007)   LDL: 124 (06/22/2007)   TG: 85 (06/22/2007)   Complete Medication List: 1)  Norvasc 5 Mg Tabs (Amlodipine besylate) .Marland Kitchen.. 1 by mouth two times a day 2)   Fluticasone Propionate 50 Mcg/act Susp (Fluticasone propionate) .... 2 sprays in each nostril once daily 3)  Simvastatin 20 Mg Tabs (Simvastatin) .Marland Kitchen.. 1 by mouth once daily 4)  Mercaptopurine 50 Mg Tabs (Mercaptopurine) .... Take 1/2 tablet by mouth once a day 5)  Tramadol Hcl 50 Mg Tabs (Tramadol hcl) .Marland Kitchen.. 1 by mouth three times a day prn 6)  Synthroid 112 Mcg Tabs (Levothyroxine sodium) .... Two  tablets by mouth once daily 7)  Wellbutrin Xl 150 Mg Tb24 (Bupropion hcl) .... 3 by mouth qd 8)  Venlafaxine Hcl 75 Mg Tabs (Venlafaxine hcl) .... Take 1 tablet by mouth once a day 9)  Trazodone Hcl 150 Mg Tabs (Trazodone hcl) .... One by mouth at bedtime 10)  Adderall Xr 30 Mg Xr24h-cap (Amphetamine-dextroamphetamine) .... Take 1 capsule by mouth two times a day 11)  Abilify 10 Mg Tabs (Aripiprazole) .... Take 1 tablet by mouth once a day 12)  Buspar 15 Mg Tabs (Buspirone hcl) .... Take one tablet by mouth at bedtime, may repeat x 1 as needed and as needed anxiety 13)  Flexeril 10 Mg Tabs (Cyclobenzaprine hcl) .... 2 by mouth at bedtime as needed 14)  Hydrochlorothiazide 25 Mg Tabs (Hydrochlorothiazide) .... Take one by mouth once a day 15)  Potassium Chloride Cr 10 Meq Cpcr (Potassium chloride) .... Take one by mouth once daily 16)  Darvocet A500 100-500 Mg Tabs (Propoxyphene n-apap) .Marland Kitchen.. 1 by mouth q 6 hours as needed pain 17)  Frova 2.5 Mg Tabs (Frovatriptan succinate) .... Take 1-2 as needed for migraines 18)  Chlorpromazine Hcl 25 Mg Tabs (Chlorpromazine hcl) .... Take 1-2 as needed for migraines if frova doesnt help 19)  Caltrate 600+d 600-400 Mg-unit Tabs (Calcium carbonate-vitamin d) .... Take one by mouth twice a day 20)  Protonix 40 Mg Tbec (Pantoprazole sodium) .... Take 1 tablet by mouth once a day. must have office visit for any further refills! 21)  Tylenol Arthritis Pain 650 Mg Tbcr (Acetaminophen) .... Take 2 by mouth twice a day 22)  Macrodantin  .... As needed with sexual activity as  directed 23)  Valtrex 500 Mg Tabs (Valacyclovir hcl) .Marland Kitchen.. 1 tablet by mouth once daily 24)  Dulcolax 5 Mg Tbec (Bisacodyl) .... 3 at bedtime 25)  Amitriptyline Hcl 50 Mg Tabs (Amitriptyline hcl) .... One tab by mouth at bedtime 26)  Vitamin D 50000 Unit Caps (Ergocalciferol) .... One capsule by mouth every week 27)  Solodyn 65 Mg Xr24h-tab (Minocycline hcl) .... By mouth as needed for acne   Serial Vital Signs/Assessments:  Time      Position  BP       Pulse  Resp  Temp     By                     130/85                         Allena Earing MD   Patient Instructions: 1)  talk further with your rheumatologist and psychiatrist about savella 2)  keep working on healthy diet and exercise- for weight loss 3)  no change in your medicines    Prescriptions: FLUTICASONE PROPIONATE 50 MCG/ACT SUSP (FLUTICASONE PROPIONATE) 2 sprays in each nostril once daily  #3 mdi x 3   Entered and Authorized by:   Allena Earing MD   Signed by:   Allena Earing MD on 03/18/2008   Method used:   Print then Give to Patient   RxID:   [id number] DARVOCET A500 100-500 MG  TABS (PROPOXYPHENE N-APAP) 1 by mouth q 6 hours as needed pain  #60 x 0   Entered and Authorized by:   Allena Earing MD   Signed by:   Allena Earing MD on 03/18/2008   Method used:   Print then Give to Patient   RxID:   [id number]  Prior Medications (reviewed today): NORVASC 5 MG TABS (AMLODIPINE BESYLATE) 1 by mouth two times a day FLUTICASONE PROPIONATE 50 MCG/ACT SUSP (FLUTICASONE PROPIONATE) 2 sprays in each nostril once daily SIMVASTATIN 20 MG TABS (SIMVASTATIN) 1 by mouth once daily MERCAPTOPURINE 50 MG TABS (MERCAPTOPURINE) Take 1/2 tablet by mouth once a day TRAMADOL HCL 50 MG TABS (TRAMADOL HCL) 1 by mouth three times a day prn SYNTHROID 112 MCG TABS (LEVOTHYROXINE SODIUM) two tablets by mouth once daily WELLBUTRIN XL 150 MG  TB24 (BUPROPION HCL) 3 by mouth qd VENLAFAXINE HCL 75 MG TABS (VENLAFAXINE HCL)  Take 1 tablet by mouth once a day TRAZODONE HCL 150 MG  TABS (TRAZODONE HCL) one by mouth at bedtime ADDERALL XR 30 MG XR24H-CAP (AMPHETAMINE-DEXTROAMPHETAMINE) Take 1 capsule by mouth two times a day ABILIFY 10 MG  TABS (ARIPIPRAZOLE) Take 1 tablet by mouth once a day BUSPAR 15 MG  TABS (BUSPIRONE HCL) Take one tablet by mouth at bedtime, may repeat x 1 as needed and as needed anxiety FLEXERIL 10 MG  TABS (CYCLOBENZAPRINE HCL) 2 by mouth at bedtime as needed HYDROCHLOROTHIAZIDE 25 MG  TABS (HYDROCHLOROTHIAZIDE) Take one by mouth once a day POTASSIUM CHLORIDE CR 10 MEQ  CPCR (POTASSIUM CHLORIDE) take one by mouth once daily DARVOCET A500 100-500 MG  TABS (PROPOXYPHENE N-APAP) 1 by mouth q 6 hours as needed pain FROVA 2.5 MG  TABS (FROVATRIPTAN SUCCINATE) take 1-2 as needed for migraines CHLORPROMAZINE HCL 25 MG  TABS (CHLORPROMAZINE HCL) take 1-2 as needed for migraines if frova doesnt help CALTRATE 600+D 600-400 MG-UNIT  TABS (CALCIUM CARBONATE-VITAMIN D) take one by mouth twice a day PROTONIX 40 MG  TBEC (PANTOPRAZOLE SODIUM) Take 1 tablet by mouth once a day. MUST HAVE OFFICE VISIT FOR ANY FURTHER REFILLS! TYLENOL ARTHRITIS PAIN 650 MG  TBCR (ACETAMINOPHEN) take 2 by mouth twice a day MACRODANTIN () as needed with sexual activity as directed VALTREX 500 MG TABS (VALACYCLOVIR HCL) 1 tablet by mouth once daily DULCOLAX 5 MG TBEC (BISACODYL) 3 at bedtime AMITRIPTYLINE HCL 50 MG TABS (AMITRIPTYLINE HCL) one tab by mouth at bedtime VITAMIN D 70263 UNIT CAPS (ERGOCALCIFEROL) one capsule by mouth every week SOLODYN 65 MG XR24H-TAB (MINOCYCLINE HCL) by mouth as needed for acne Current Allergies (reviewed today): ! ERYTHROMYCIN ! * EPHEDRINE ASA NSAIDS Current Medications (including changes made in today's visit):  NORVASC 5 MG TABS (AMLODIPINE BESYLATE) 1 by mouth two times a day FLUTICASONE PROPIONATE 50 MCG/ACT SUSP (FLUTICASONE PROPIONATE) 2 sprays in each nostril once daily SIMVASTATIN 20  MG TABS (SIMVASTATIN) 1 by mouth once daily MERCAPTOPURINE 50 MG TABS (MERCAPTOPURINE) Take 1/2 tablet by mouth once a day TRAMADOL HCL 50 MG TABS (TRAMADOL HCL) 1 by mouth three times a day prn SYNTHROID 112 MCG TABS (LEVOTHYROXINE SODIUM) two tablets by mouth once daily WELLBUTRIN XL 150 MG  TB24 (BUPROPION HCL) 3 by mouth qd VENLAFAXINE HCL 75 MG TABS (VENLAFAXINE HCL) Take 1 tablet by mouth once a day TRAZODONE HCL 150 MG  TABS (TRAZODONE HCL) one by mouth at bedtime ADDERALL XR 30 MG XR24H-CAP (AMPHETAMINE-DEXTROAMPHETAMINE) Take 1 capsule by mouth two times a day ABILIFY 10 MG  TABS (ARIPIPRAZOLE) Take 1 tablet by mouth once a day BUSPAR 15 MG  TABS (BUSPIRONE HCL) Take one tablet by mouth at bedtime, may repeat x 1 as needed and as needed anxiety FLEXERIL 10 MG  TABS (CYCLOBENZAPRINE HCL) 2 by mouth at bedtime as needed HYDROCHLOROTHIAZIDE 25 MG  TABS (HYDROCHLOROTHIAZIDE) Take one by mouth  once a day POTASSIUM CHLORIDE CR 10 MEQ  CPCR (POTASSIUM CHLORIDE) take one by mouth once daily DARVOCET A500 100-500 MG  TABS (PROPOXYPHENE N-APAP) 1 by mouth q 6 hours as needed pain FROVA 2.5 MG  TABS (FROVATRIPTAN SUCCINATE) take 1-2 as needed for migraines CHLORPROMAZINE HCL 25 MG  TABS (CHLORPROMAZINE HCL) take 1-2 as needed for migraines if frova doesnt help CALTRATE 600+D 600-400 MG-UNIT  TABS (CALCIUM CARBONATE-VITAMIN D) take one by mouth twice a day PROTONIX 40 MG  TBEC (PANTOPRAZOLE SODIUM) Take 1 tablet by mouth once a day. MUST HAVE OFFICE VISIT FOR ANY FURTHER REFILLS! TYLENOL ARTHRITIS PAIN 650 MG  TBCR (ACETAMINOPHEN) take 2 by mouth twice a day * MACRODANTIN as needed with sexual activity as directed VALTREX 500 MG TABS (VALACYCLOVIR HCL) 1 tablet by mouth once daily DULCOLAX 5 MG TBEC (BISACODYL) 3 at bedtime AMITRIPTYLINE HCL 50 MG TABS (AMITRIPTYLINE HCL) one tab by mouth at bedtime VITAMIN D 43276 UNIT CAPS (ERGOCALCIFEROL) one capsule by mouth every week SOLODYN 65 MG  XR24H-TAB (MINOCYCLINE HCL) by mouth as needed for acne

## 2010-02-15 NOTE — Progress Notes (Signed)
Summary: Rx Simvastatin  Phone Note Call from Patient Call back at [phone]   Caller: Patient Call For: Allena Earing MD Summary of Call: Patient was getting her Simvastatin at [facility] and [phone].  Patient no longer has [facility]. Initial call taken by: Emelia Salisbury,  April 18, 2008 11:42 AM  Follow-up for Phone Call        px written on EMR for call in  Follow-up by: Allena Earing MD,  April 18, 2008 1:39 PM    New/Updated Medications: SIMVASTATIN 20 MG TABS (SIMVASTATIN) 1 by mouth once daily   Prescriptions: SIMVASTATIN 20 MG TABS (SIMVASTATIN) 1 by mouth once daily  #30 x 11   Entered by:   Daralene Milch   Authorized by:   Allena Earing MD   Signed by:   Daralene Milch on 04/18/2008   Method used:   Electronically to        [facility]. [phone]* (retail)       [address]       [place], [place]  [address]       Ph: [phone]       Fax: [phone]   RxID:   [id number]

## 2010-02-15 NOTE — Progress Notes (Signed)
Summary: Refill Cyclobenzaprine/Simvastatin  Phone Note Call from Patient Call back at [phone] or [phone]   Caller: Patient Call For: Dr. Glori Bickers Summary of Call: Pt needs a refill on her Simvastatin and Cyclobenzaprine.  Pt has an appt. scheduled with you next week for a CPX.    Pt has been out of town and they are leaving again tomorrow and would like to pick the Cyclobenzaprine up tonight..  [facility] Initial call taken by: Emelia Salisbury,  Jun 09, 2007 1:06 PM  Follow-up for Phone Call        px written on EMR for call in will refil once- then write all px for year at her PE Follow-up by: Allena Earing MD,  Jun 09, 2007 1:26 PM  Additional Follow-up for Phone Call Additional follow up Details #1::        [facility], advised pt Additional Follow-up by: Marty Heck,  Jun 09, 2007 2:24 PM    New/Updated Medications: SIMVASTATIN 20 MG TABS (SIMVASTATIN) 1 by mouth once daily FLEXERIL 10 MG  TABS (CYCLOBENZAPRINE HCL) 2 by mouth at bedtime as needed   Prescriptions: SIMVASTATIN 20 MG TABS (SIMVASTATIN) 1 by mouth once daily  #30 x 0   Entered and Authorized by:   Allena Earing MD   Signed by:   Marty Heck on 06/09/2007   Method used:   Telephoned to ...         RxID:   [id number] FLEXERIL 10 MG  TABS (CYCLOBENZAPRINE HCL) 2 by mouth at bedtime as needed  #60 x 0   Entered and Authorized by:   Allena Earing MD   Signed by:   Marty Heck on 06/09/2007   Method used:   Telephoned to ...         RxID:   [id number]

## 2010-02-15 NOTE — Progress Notes (Signed)
Summary: Cyclobenzaprine 67m  Phone Note Refill Request Call back at [phone] from:  [id number] December 14, 2009 9:19 AM  Refills Requested: Medication #1:  FLEXERIL 10 MG  TABS 2 by mouth at bedtime   Last Refilled: 11/14/2009 [id number] request refill for Cyclobenzaprine 119m Last refill date 11/14/09.Please advise.    Method Requested: Electronic Initial call taken by: ReOzzie HoylePN,  December  1, 207207:20 AM  Follow-up for Phone Call        px written on EMR for call in  Follow-up by: MaAllena EaringD,  December 14, 2009 12:41 PM  Additional Follow-up for Phone Call Additional follow up Details #1::        Medication phoned to [facility]. ReOzzie HoylePN  December  1, 2021822:49 PM     Prescriptions: FLEXERIL 10 MG  TABS (CYCLOBENZAPRINE HCL) 2 by mouth at bedtime  #60 x 5   Entered and Authorized by:   MaAllena EaringD   Signed by:   ReOzzie HoylePN on 1288/33/7445 Method used:   Telephoned to ...       [phone][facility]       [address]     [place]  [address]     Ph: [phone]     Fax: [phone] RxID:   [id number]

## 2010-02-15 NOTE — Progress Notes (Signed)
  Phone Note Refill Request Call back at cell [phone] Message from:  Patient on August 08, 2006 11:14 AM  Refills Requested: Medication #1:  TRAMADOL HCL 50 MG TABS pharmacy says they havent heard about rx. [facility] [phone]  Initial call taken by: Daralene Milch,  August 08, 2006 11:14 AM Caller: Patient  Follow-up for Phone Call        rx called in per instructions on dr 1st. ..................................................................Marland KitchenRonny Bacon Chavers  August 08, 2006 1:48 PM

## 2010-02-15 NOTE — Letter (Signed)
Summary: [facility] / OFFICE VISIT / DR. [place] / OFFICE VISIT / DR. AJAY KUMAR   Imported By: Pilar Grammes 02/08/2008 11:41:29  _____________________________________________________________________  External Attachment:    Type:   Image     Comment:   External Document

## 2010-02-15 NOTE — Progress Notes (Signed)
----   Converted from flag ---- ---- 10/09/2009 8:21 PM, Judith Part MD wrote: please check lipid, wellness, vit D level v70.0, 272, vit D def thanks  ---- 10/09/2009 7:56 AM, Liane Comber CMA (AAMA) wrote: Lab orders please! Good Morning! This pt is scheduled for cpx labs tomorrow, which labs to draw and dx codes to use? Thanks Tasha ------------------------------

## 2010-02-15 NOTE — Consult Note (Signed)
Summary: [facility]/OV/Dr. Georjean Mode [facility]/Dr. Dwyane Dee   Imported By: Genice Rouge 10/10/2006 13:29:10  _____________________________________________________________________  External Attachment:    Type:   Image     Comment:   External Document

## 2010-02-15 NOTE — Progress Notes (Signed)
  Phone Note Call from Patient Call back at [phone]   Caller: Patient Call For: bean Summary of Call: seen weeks ago for uti, took cipro x 1o days, pt is now c/u sx of urgency, not so much frequency but urgency increases a little with every urination, pt wants to know if you would extend abx? pt is going out of town fri. Initial call taken by: Liane Comber,  August 05, 2006 2:36 PM  Follow-up for Phone Call        needs to come in for urine culture --------------yes, come in to give specimen for culture.  needs to do today if possiple so we can get it back before the weekend Follow-up by: Gildardo Griffes FNP,  August 06, 2006 1:02 PM  Additional Follow-up for Phone Call Additional follow up Details #1::        there are no appts should she just come to give  a speciman? ..................................................................Marland KitchenMarcelle Smiling Chavers  August 06, 2006 7:54 AM   LEFT MESSAGE ON MACHINE ..................................................................Marland KitchenLiane Comber  August 06, 2006 2:14 PM   spoke with pt she can come to give a specimen this pm. I put her on nurse schedule ..................................................................Marland KitchenLiane Comber  August 06, 2006 2:40 PM

## 2010-02-15 NOTE — Assessment & Plan Note (Signed)
Summary: ?UTI/MK   Vital Signs:  Patient Profile:   50 Years Old Female Weight:      192 pounds Temp:     98.0 degrees F oral Pulse rate:   94 / minute BP sitting:   123 / 94  (right arm) Cuff size:   regular  Vitals Entered By: Glenna Durand (August 19, 2006 8:23 AM)               Chief Complaint:  ?UTI, taking pyridium, and unable to read dip.  History of Present Illness: Here due to dysuria and urgency which began 8/4  ~ noo, , started pyridium.  nocturia none last night, no fever/chills.  Urgency continues through the pyridium.  Just [place] from the beach.  04/22/06 urine culture + E coli---cipro 250 2 initially then 1 two times a day x 5 d (pan sensitive)                                                Sx continued, needed Kefles 500 two times a day x 3d 07/22/06 dysuria--tx with cipro 500 two times a day x7d 08/07/06  dysuria--culture neg--no tx  Current Allergies (reviewed today): No known allergies  Updated/Current Medications (including changes made in today's visit):  NORVASC 5 MG TABS (AMLODIPINE BESYLATE)  FLUTICASONE PROPIONATE 50 MCG/ACT SUSP (FLUTICASONE PROPIONATE)  SIMVASTATIN 20 MG TABS (SIMVASTATIN)  MERCAPTOPURINE 50 MG TABS (MERCAPTOPURINE)  TRAMADOL HCL 50 MG TABS (TRAMADOL HCL)  SYNTHROID 200 MCG TABS (LEVOTHYROXINE SODIUM)  * EFFEXOR XR   CP24 (VENLAFAXINE HCL CP24), 225 MG once daily * WELLBUTRIN   TABS (BUPROPION HCL TABS), 450 MG once daily PRILOSEC 20 MG  CPDR (OMEPRAZOLE) two times a day SYNTHROID 25 MCG  TABS (LEVOTHYROXINE SODIUM) Take 1 tablet by mouth once a day EFFEXOR XR 150 MG  CP24 (VENLAFAXINE HCL) Take 1 tablet by mouth once a day EFFEXOR XR 75 MG  CP24 (VENLAFAXINE HCL) Take 1 tablet by mouth once a day TRAZODONE HCL 300 MG  TABS (TRAZODONE HCL) Take 1 tablet by mouth once hs TRAZODONE HCL 150 MG  TABS (TRAZODONE HCL) Take 1 tablet by mouth once hs MELATONIN 3 MG  CAPS (MELATONIN) at bedtime WELLBUTRIN XL 300 MG  TB24 (BUPROPION HCL)  Take 1 tablet by mouth once a day WELLBUTRIN SR 150 MG  TB12 (BUPROPION HCL) Take 1 tablet by mouth once a day ADDERALL 30 MG  TABS (AMPHETAMINE-DEXTROAMPHETAMINE) .qdtab ADDERALL 10 MG  TABS (AMPHETAMINE-DEXTROAMPHETAMINE) 1- 2 tabs at lunch ABILIFY 10 MG  TABS (ARIPIPRAZOLE) Take 1 tablet by mouth once a day BUSPAR 15 MG  TABS (BUSPIRONE HCL) Take 1 tablet by mouth qid MACROBID 100 MG  CAPS (NITROFURANTOIN MONOHYD MACRO) 1 bid      Review of Systems      See HPI   Physical Exam  General:     alert, well-developed, well-nourished, well-hydrated, and overweight-appearing.   Head:     normocephalic.   Eyes:     pupils equal and pupils round.   Lungs:     normal respiratory effort.   Abdomen:     no CVA tenderness Neurologic:     alert & oriented X3, sensation intact to light touch, and gait normal.   Skin:     turgor normal and color normal.  very tanned Psych:  Oriented X3 and normally interactive.      Impression & Recommendations:  Problem # 1:  U T I (ICD-599.0) Assessment: New       increase by mouth fluids continue Pyridium as needed few days  Her updated medication list for this problem includes:    Macrobid 100 Mg Caps (Nitrofurantoin monohyd macro) .Marland Kitchen... 1 bid  Orders: T-Culture, Urine ([id number]) UA Dipstick W/ Micro (81000)  The following medications were removed from the medication list:    Cipro 500 Mg Tabs (Ciprofloxacin hcl) .Marland Kitchen... 1 bid  Her updated medication list for this problem includes:    Macrobid 100 Mg Caps (Nitrofurantoin monohyd macro) .Marland Kitchen... 1 bid   Complete Medication List: 1)  Norvasc 5 Mg Tabs (Amlodipine besylate) 2)  Fluticasone Propionate 50 Mcg/act Susp (Fluticasone propionate) 3)  Simvastatin 20 Mg Tabs (Simvastatin) 4)  Mercaptopurine 50 Mg Tabs (Mercaptopurine) 5)  Tramadol Hcl 50 Mg Tabs (Tramadol hcl) 6)  Synthroid 200 Mcg Tabs (Levothyroxine sodium) 7)  Effexor Xr Cp24 (venlafaxine Hcl Cp24), 225 Mg  ....  Once daily 8)  Wellbutrin Tabs (bupropion Hcl Tabs), 450 Mg  .... Once daily 9)  Prilosec 20 Mg Cpdr (Omeprazole) .... Two times a day 10)  Synthroid 25 Mcg Tabs (Levothyroxine sodium) .... Take 1 tablet by mouth once a day 11)  Effexor Xr 150 Mg Cp24 (Venlafaxine hcl) .... Take 1 tablet by mouth once a day 12)  Effexor Xr 75 Mg Cp24 (Venlafaxine hcl) .... Take 1 tablet by mouth once a day 13)  Trazodone Hcl 300 Mg Tabs (Trazodone hcl) .... Take 1 tablet by mouth once hs 14)  Trazodone Hcl 150 Mg Tabs (Trazodone hcl) .... Take 1 tablet by mouth once hs 15)  Melatonin 3 Mg Caps (Melatonin) .... At bedtime 16)  Wellbutrin Xl 300 Mg Tb24 (Bupropion hcl) .... Take 1 tablet by mouth once a day 17)  Wellbutrin Sr 150 Mg Tb12 (Bupropion hcl) .... Take 1 tablet by mouth once a day 18)  Adderall 30 Mg Tabs (Amphetamine-dextroamphetamine) .... .qdtab 19)  Adderall 10 Mg Tabs (Amphetamine-dextroamphetamine) .Marland Kitchen.. 1- 2 tabs at lunch 20)  Abilify 10 Mg Tabs (Aripiprazole) .... Take 1 tablet by mouth once a day 21)  Buspar 15 Mg Tabs (Buspirone hcl) .... Take 1 tablet by mouth qid 22)  Macrobid 100 Mg Caps (Nitrofurantoin monohyd macro) .Marland Kitchen.. 1 bid   Patient Instructions: 1)  Drink as much fluid as you can tolerate for the next few days.    Prescriptions: MACROBID 100 MG  CAPS (NITROFURANTOIN MONOHYD MACRO) 1 bid  #14 x 0   Entered and Authorized by:   Dixie Dials FNP   Signed by:   Dixie Dials FNP on 08/19/2006   Method used:   Print then Give to Patient   RxID:   [id number]      Laboratory Results   Urine Tests  Date/Time Recieved: 08/19/06  Date/Time Reported: 08/19/06  Routine Urinalysis   Color: orange Glucose: unable   (Normal Range: Negative) Ketone: unable   (Normal Range: Negative) Blood: trace-intact   (Normal Range: Negative) Protein: unable   (Normal Range: Negative) Nitrite: unable   (Normal Range: Negative)  Urine Microscopic WBC/hpf:  2-5 RBC/hpf: 5-8 Bacteria: trace Epithelial: occ Other: has taken pyridium this AM    Comments: unable to read dip ua due to pt taking pyridium

## 2010-02-15 NOTE — Consult Note (Signed)
Summary: [facility]/Dr. Joretta Bachelor  [facility]/Dr. Joretta Bachelor   Imported By: Lanice Shirts 12/07/2007 15:53:19  _____________________________________________________________________  External Attachment:    Type:   Image     Comment:   External Document

## 2010-02-15 NOTE — Progress Notes (Signed)
Summary: Protonix 63m/Mertocaptopurine 535m Medications Added PROTONIX 40 MG  TBEC (PANTOPRAZOLE SODIUM) Take 1 tablet by mouth once a day. MUST HAVE OFFICE VISIT FOR ANY FURTHER REFILLS!       Phone Note Call from Patient Call back at Work Phone ([phone] Caller: Patient Call For: BRODIE Reason for Call: Refill Medication Details for Reason: Protonix 4081mertocaptopurine 75m38mmmary of Call: Protonix 40mg42mtocaptopurine 75mg 61ms not completely out but will be soon... needs refills of both... pt uses mailing w/ [facility] Initial call taken by: AllisoLucien Monsember 28, 2009 1:15 PM  Follow-up for Phone Call        [facility] Follow-up by: DottieAwilda Bill January 11, 2008 1:23 PM    New/Updated Medications: PROTONIX 40 MG  TBEC (PANTOPRAZOLE SODIUM) Take 1 tablet by mouth once a day. MUST HAVE OFFICE VISIT FOR ANY FURTHER REFILLS!   Prescriptions: PROTONIX 40 MG  TBEC (PANTOPRAZOLE SODIUM) Take 1 tablet by mouth once a day. MUST HAVE OFFICE VISIT FOR ANY FURTHER REFILLS!  #90 x 0   Entered by:   DottieAwilda Bill Authorized by:   Dora MLafayette DragonSigned by:   DottieAwilda Billn 01/11/2008   Method used:   Printed then faxed to ...       [facility]* (mail-order)             ,          Ph: [phone] Fax: [phone]:   [id number] 50 MG TABS (MERCAPTOPURINE) Take 1 tablet by mouth once a day. MUST HAVE OFFICE VISIT FOR ANY FURTHER REFILLS!  #90 x 0   Entered by:   DottieAwilda Bill Authorized by:   Dora MLafayette DragonSigned by:   DottieAwilda Billn 01/11/2008   Method used:   Printed then faxed to ...       [facility])             ,          Ph: [phone] Fax: [phone]:   [phone]

## 2010-02-15 NOTE — Progress Notes (Signed)
Summary: wants to change lipitor  Phone Note Call from Patient Call back at Home Phone [phone]   Caller: Patient Call For: Allena Earing MD Summary of Call: Pt is asking to change from lipitor 20 to 40 mg's.  Her pharmacist told her that she can get the 40 mg's and cut them in half and that will save her money.  Uses [facility]. Initial call taken by: Marty Heck CMA,  September 08, 2009 1:02 PM  Follow-up for Phone Call        will change her px px written on EMR for call in  Follow-up by: Allena Earing MD,  September 08, 2009 1:16 PM  Additional Follow-up for Phone Call Additional follow up Details #1::        Rx sent to pharmacy electronically, patient notified as instructed via telephone. Additional Follow-up by: Sherrian Divers CMA Deborra Medina),  September 08, 2009 3:11 PM    New/Updated Medications: LIPITOR 40 MG TABS (ATORVASTATIN CALCIUM) 1/2 by mouth once daily Prescriptions: LIPITOR 40 MG TABS (ATORVASTATIN CALCIUM) 1/2 by mouth once daily  #15 x 11   Entered by:   Sherrian Divers CMA ([place])   Authorized by:   Allena Earing MD   Signed by:   Sherrian Divers CMA Deborra Medina) on 09/08/2009   Method used:   Electronically to        [facility] [phone]* (retail)       [address]       [place], [place]  [address]       Ph: [phone]       Fax: [phone]   RxID:   [id number]

## 2010-02-15 NOTE — Assessment & Plan Note (Signed)
Summary: CPX/CLE   Vital Signs:  Patient profile:   50 year old female Height:      65.25 inches Weight:      215 pounds BMI:     35.63 Temp:     97.8 degrees F oral Pulse rate:   72 / minute Pulse rhythm:   regular BP sitting:   130 / 82  (left arm) Cuff size:   large  Vitals Entered By: Daralene Milch (June 22, 2008 9:35 AM)  History of Present Illness: here for health mt exam and chronic med issues  feeling generally ok   little more bloated and edema this year  wants to inc hctz-- needs to increase it -- is uncomfortable and needs more med worse on vacation- and not bettr now is watching her salt intake -- except [place] bacon and hot dogs    HTN - fair control 130/82 today has been in good control   wt is stable with BMI high of 35 diet--overall better than she was -- still too much pasta  exercise- has some exercise-- hard to get over with fibro -- busy week   [facility] - no results yet- drew it today   thyroid- followed by Dr Dwyane Dee- has been really stable with no problems   in remission from UC, and hx colon polyps- due for colonosc 12/10-- due this december  does not need [place] to schedule that   vit D def - otc  not taking any right now  was deficient- was on high dose for a while- never resumed otc   Dr Laurey Morale did pap recently -- was nl in march  has mam planned on july 9th   last dexa nl 01 ca intake - not good - ran out of her caltrate  cannot afford some of her meds and supplemtents  no period since august - thinks she is menopaual   Td 03 pneumovax 09  last lipids fair on statin - trig 85/ HDL 50 and LDL 124- still a little high is out of her simvastatin - cannot afford , should be able to afford it 6/15  also out of venalfaxine  chol will be up   has a bad bunion on R foot -planning surg this summer a lot more acne this year -- uses solodyn (minocycline), not candidate for accutatne  needs f/u with her derm     Allergies: 1)  ! Erythromycin 2)  ! * Ephedrine 3)  ! 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, atypical hyperplasia Dexa- normal (04/1999) Colonoscopy- ulcerative colitis (01/2001) EGD- polyp (09/20023) Exercise stress test- neg (11/2003)   nuclear stress test neg 6/09 Colonoscopy- UC, polyp (04/2004) colonoscopy ulcerative colitis, no polyps 12/08 (11/09) sleep study - no apnea, but did snore (done by [facility])  Family History: Last updated: Jun 25, 2007 father CAD, died of colon cancer, ALZ Mother CAD, HTN, OP great aunts had breast cancer  uncle with CAD and AAA cousin with CVA some remote DM 2   Social History: Last updated: 06/22/2008 Marital Status: Married Children: 2--11-12 Occupation: Buyer, 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  recovered 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, television/film/video   Past Medical History: Current Problems:  BENIGN  NEOPLASM OTH&UNSPEC SITE DIGESTIVE SYSTEM (ICD-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) HIATAL HERNIA (ICD-553.3) COLONIC POLYPS, ADENOMATOUS, HX OF (ICD-V12.72) ADENOCARCINOMA, COLON, FAMILY HX (ICD-V16.0) EDEMA- LOCALIZED (ICD-782.3) 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) HYPERCHOLESTEROLEMIA (ICD-272.0) MIGRAINES, HX OF (ICD-V13.8) COLITIS, ULCERATIVE (ICD-556.9) HYPOTHYROIDISM (ICD-244.9) HYPERTENSION (ICD-401.9) GERD (ICD-530.81) DEPRESSION (ICD-311) ASTHMA (ICD-493.90) ANXIETY (ICD-300.00) ALLERGIC RHINITIS (ICD-477.9) FIBROMYALGIA (ICD-729.1) INSOMNIA (ICD-780.52) DYSURIA (ICD-788.1) acne hx of ovarian cysts    GI-- Dr Olevia Perches gyn-- Dr Laurey Morale derm---Dr Nicole Kindred psych-- Dr Toy Care  endo--Dr Dwyane Dee  rheum- Dr  Charlestine Night  Social History: Marital Status: Married Children: 2--11-12 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 Eyes:  Denies blurring and eye pain. ENT:  Denies sinus pressure and sore throat. CV:  Complains of swelling of feet; denies chest pain or discomfort, palpitations, and shortness of breath with exertion. Resp:  Denies cough and wheezing. GI:  Denies abdominal pain, bloody stools, change in bowel habits, indigestion, loss of appetite, and nausea. GU:  Denies abnormal vaginal bleeding, discharge, and dysuria. MS:  Complains of joint pain and stiffness; denies muscle weakness. Derm:  Denies lesion(s), poor wound healing, and rash. Neuro:  Denies numbness and tingling. Psych:  mood is fairly stable . Endo:  Denies excessive thirst and excessive urination. Heme:  Denies abnormal bruising and bleeding.  Physical Exam  General:  overweight but generally well appearing  Head:  normocephalic, atraumatic, and no abnormalities observed.   Eyes:  vision grossly intact, pupils equal, pupils round, and pupils reactive to light.  no conjunctival pallor, injection or icterus  Ears:  R ear normal and L ear normal.   Nose:  no nasal discharge.  - nares are boggy Mouth:  pharynx pink and moist.   Neck:  supple with full rom and no masses or thyromegally, no JVD or carotid bruit  Chest Wall:  No deformities, masses, or tenderness noted. Lungs:  Normal respiratory effort, chest expands symmetrically. Lungs are clear to auscultation, no crackles or wheezes. Heart:  Normal rate and regular rhythm. S1 and S2 normal without gallop, murmur, click, rub or other extra sounds. Abdomen:  Bowel sounds positive,abdomen soft and non-tender without masses, organomegaly or hernias noted. no renal bruits  Msk:  No deformity or scoliosis noted  of thoracic or lumbar spine.  no acute joint changes Pulses:  R and L carotid,radial,femoral,dorsalis pedis and posterior tibial pulses are full and equal bilaterally Extremities:  trace left pedal edema and trace right pedal edema.   Neurologic:  sensation intact to light touch, gait normal, and DTRs symmetrical and normal.   Skin:  Intact without suspicious lesions or rashes tanned with some lentigos Cervical Nodes:  No lymphadenopathy noted Inguinal Nodes:  No significant adenopathy Psych:  normal affect, talkative and pleasant    Impression & Recommendations:  Problem # 1:  HEALTH MAINTENANCE EXAM (ICD-V70.0) Assessment Comment Only reviewed health habits including diet, exercise and skin cancer prevention reviewed health maintenance list and family history  [facility] disc imp of diet and exercise for wt loss to gen health Orders: Venipuncture (03474) TLB-BMP (Basic Metabolic Panel-BMET) (25956-LOVFIEP) TLB-CBC Platelet - w/Differential (85025-CBCD) TLB-Hepatic/Liver Function Pnl (80076-HEPATIC) TLB-TSH (Thyroid Stimulating Hormone) (84443-TSH) TLB-Lipid Panel (80061-LIPID) Specimen Handling (99000) T-Vitamin D (25-Hydroxy) (32951-88416)  Problem # 2:  POSTMENOPAUSAL STATUS (ICD-V49.81) Assessment: New schedule dexa risk factors for OP include fam hx, thyroid supp, post men status , and  vit D def disc imp of ca and vit D Orders: Radiology Referral (Radiology)  Problem # 3:  HYPOTHYROIDISM (ICD-244.9) Assessment: Unchanged  tsh pending- will send to Dr Dwyane Dee clinically stable - with slt more edema Her updated medication list for this problem includes:    Synthroid 112 Mcg Tabs (Levothyroxine sodium) .Marland Kitchen..Marland Kitchen Two tablets by mouth once daily  Orders: Radiology Referral (Radiology)  Labs Reviewed: TSH: 20.14 (03/15/2003)    Chol: 191 (06/22/2007)   HDL: 50.3 (06/22/2007)   LDL: 124 (06/22/2007)   TG: 85 (06/22/2007)  Problem # 4:   UNSPECIFIED VITAMIN D DEFICIENCY (ICD-268.9) Assessment: Unchanged check level today prev on high dose weekly tx- but failed to start otc after that will update after labs Orders: Venipuncture (49753) TLB-BMP (Basic Metabolic Panel-BMET) (00511-MYTRZNB) TLB-CBC Platelet - w/Differential (85025-CBCD) TLB-Hepatic/Liver Function Pnl (80076-HEPATIC) TLB-TSH (Thyroid Stimulating Hormone) (84443-TSH) TLB-Lipid Panel (80061-LIPID) Specimen Handling (99000) T-Vitamin D (25-Hydroxy) (56701-41030) Radiology Referral (Radiology)  Problem # 5:  EDEMA- LOCALIZED (ICD-782.3) Assessment: Deteriorated worse edema  will change hctz to spironolactone- to see if this also helps with acne (? pt may have hx of pcos) f/u 2-4 weeks adv to hold her K   The following medications were removed from the medication list:    Hydrochlorothiazide 25 Mg Tabs (Hydrochlorothiazide) .Marland Kitchen... Take one by mouth once a day Her updated medication list for this problem includes:    Spironolactone 25 Mg Tabs (Spironolactone) .Marland Kitchen... 1 by mouth once daily  Problem # 6:  HYPERCHOLESTEROLEMIA (ICD-272.0) Assessment: Deteriorated  expect high- off statin (money issues) plans to re start  rev sat fats in diet  Her updated medication list for this problem includes:    Simvastatin 20 Mg Tabs (Simvastatin) .Marland Kitchen... 1 by mouth once daily  Orders: Venipuncture (13143) TLB-BMP (Basic Metabolic Panel-BMET) (88875-ZVJKQAS) TLB-CBC Platelet - w/Differential (85025-CBCD) TLB-Hepatic/Liver Function Pnl (80076-HEPATIC) TLB-TSH (Thyroid Stimulating Hormone) (84443-TSH) TLB-Lipid Panel (80061-LIPID) Specimen Handling (99000) T-Vitamin D (25-Hydroxy) (60156-15379)  Labs Reviewed: SGOT: 26 (06/22/2007)   SGPT: 29 (06/22/2007)   HDL:50.3 (06/22/2007)  LDL:124 (06/22/2007)  Chol:191 (06/22/2007)  Trig:85 (06/22/2007)  Problem # 7:  HYPERTENSION (ICD-401.9) Assessment: Unchanged  bp stable  re check with change to spironolactone 2-4  weeks  The following medications were removed from the medication list:    Hydrochlorothiazide 25 Mg Tabs (Hydrochlorothiazide) .Marland Kitchen... Take one by mouth once a day Her updated medication list for this problem includes:    Norvasc 5 Mg Tabs (Amlodipine besylate) .Marland Kitchen... 1 by mouth two times a day    Spironolactone 25 Mg Tabs (Spironolactone) .Marland Kitchen... 1 by mouth once daily  BP today: 130/82 Prior BP: 140/90 (03/18/2008)  Labs Reviewed: K+: 3.7 (06/22/2007) Creat: : 0.9 (06/22/2007)   Chol: 191 (06/22/2007)   HDL: 50.3 (06/22/2007)   LDL: 124 (06/22/2007)   TG: 85 (06/22/2007)  Complete Medication List: 1)  Norvasc 5 Mg Tabs (Amlodipine besylate) .Marland Kitchen.. 1 by mouth two times a day 2)  Fluticasone Propionate 50 Mcg/act Susp (Fluticasone propionate) .... 2 sprays in each nostril once daily 3)  Simvastatin 20 Mg Tabs (Simvastatin) .Marland Kitchen.. 1 by mouth once daily 4)  Tramadol Hcl 50 Mg Tabs (Tramadol hcl) .Marland Kitchen.. 1 by mouth three times a day prn 5)  Synthroid 112 Mcg Tabs (Levothyroxine sodium) .... Two tablets by mouth once daily 6)  Wellbutrin Xl 150 Mg Tb24 (Bupropion hcl) .... 3 by mouth qd 7)  Venlafaxine Hcl 75 Mg Tabs (Venlafaxine hcl) .... Take 1 tablet by mouth once a day 8)  Trazodone Hcl 150 Mg Tabs (Trazodone hcl) .... One by mouth at bedtime 9)  Adderall Xr 30 Mg Xr24h-cap (Amphetamine-dextroamphetamine) .... Take 1 capsule by mouth two times a day 10)  Abilify 10 Mg Tabs (Aripiprazole) .... Take 1 tablet by mouth once a day 11)  Buspar 15 Mg Tabs (Buspirone hcl) .... Take one tablet by mouth at bedtime, may repeat x 1 as needed and as needed anxiety 12)  Flexeril 10 Mg Tabs (Cyclobenzaprine hcl) .... 2 by mouth at bedtime as needed 13)  Potassium Chloride Cr 10 Meq Cpcr (Potassium chloride) .... Take one by mouth once daily (holding 6/10) 14)  Darvocet A500 100-500 Mg Tabs (Propoxyphene n-apap) .Marland Kitchen.. 1 by mouth q 6 hours as needed pain 15)  Frova 2.5 Mg Tabs (Frovatriptan succinate) .... Take 1-2  as needed for migraines 16)  Chlorpromazine Hcl 25 Mg Tabs (Chlorpromazine hcl) .... Take 1-2 three times a day as needed for migraines if frova doesnt help 17)  Caltrate 600+d 600-400 Mg-unit Tabs (Calcium carbonate-vitamin d) .... Take one by mouth twice a day 18)  Protonix 40 Mg Tbec (Pantoprazole sodium) .... Take 1 tablet by mouth once a day. 19)  Tylenol Arthritis Pain 650 Mg Tbcr (Acetaminophen) .... Take 2 by mouth twice a day 20)  Macrodantin  .... As needed with sexual activity as directed 21)  Valtrex 500 Mg Tabs (Valacyclovir hcl) .Marland Kitchen.. 1 tablet by mouth twice daily 22)  Dulcolax 5 Mg Tbec (Bisacodyl) .... 3 at bedtime 23)  Amitriptyline Hcl 50 Mg Tabs (Amitriptyline hcl) .... One tab by mouth at bedtime 24)  Solodyn 90 Mg Xr24h-tab (Minocycline hcl) .... By mouth as needed acne 25)  Multivitamins Tabs (Multiple vitamin) .... Take 1 tablet by mouth once a day 26)  Phazyme 125 Mg Chew (Simethicone) .... One by mouth three times a day 27)  Spironolactone 25 Mg Tabs (Spironolactone) .Marland Kitchen.. 1 by mouth once daily  Patient Instructions: 1)  pending labs  2)  the current recommendation for calcium intake is 1200-1500 mg daily with [phone] IU of vitamin D  3)  we will set up dexa at check out - please check with your insurance about coverage  4)  I am changing your diuretic from hctz to spironolactone (this may help acne) 5)  schedule follow up in 2-4 weeks- visit for HTN and swelling- and labs to check electrylytes  6)  hold your K until next visit  Prescriptions: FLEXERIL 10 MG  TABS (CYCLOBENZAPRINE HCL) 2 by mouth at bedtime as needed  #60 x 5   Entered and Authorized by:   Allena Earing MD   Signed by:   Allena Earing MD on 06/22/2008   Method used:   Print then Give to Patient   RxID:   [id number] PROTONIX 40 MG  TBEC (PANTOPRAZOLE SODIUM) Take 1 tablet by mouth once a day.  #30 x 11   Entered and Authorized by:   Allena Earing MD   Signed by:   Allena Earing MD on  06/22/2008   Method used:   Print then Give to Patient   RxID:   [id number] SIMVASTATIN 20 MG TABS (SIMVASTATIN) 1 by mouth once daily  #30 x 11   Entered and Authorized by:   Allena Earing MD   Signed by:   Allena Earing MD on 06/22/2008   Method used:   Print then Give to Patient   RxID:   [id number] FLUTICASONE PROPIONATE 50 MCG/ACT SUSP (  FLUTICASONE PROPIONATE) 2 sprays in each nostril once daily  #1 mdi x 11   Entered and Authorized by:   Allena Earing MD   Signed by:   Allena Earing MD on 06/22/2008   Method used:   Print then Give to Patient   RxID:   [id number] NORVASC 5 MG TABS (AMLODIPINE BESYLATE) 1 by mouth two times a day  #60 x 11   Entered and Authorized by:   Allena Earing MD   Signed by:   Allena Earing MD on 06/22/2008   Method used:   Print then Give to Patient   RxID:   [id number] SPIRONOLACTONE 25 MG TABS (SPIRONOLACTONE) 1 by mouth once daily  #30 x 3   Entered and Authorized by:   Allena Earing MD   Signed by:   Allena Earing MD on 06/22/2008   Method used:   Print then Give to Patient   RxID:   [id number]

## 2010-02-15 NOTE — Progress Notes (Signed)
Summary: orders for labs  Phone Note Call from Patient Call back at Work Phone [phone]   Caller: Patient Call For: Judith Part MD Summary of Call: pt's ins will only pay for her to [facility] [address]. pt has cpx scheduled on wed do you want pt to take order to lab before appt or will you give her order at appt for labs? Initial call taken by: Liane Comber,  June 20, 2008 4:16 PM  Follow-up for Phone Call        that would be great to have labs before PE if possible here is order on px pad for fasting labs wellness prof, lipid, vit D level v70.0, 268.9, 272 - thanks  Follow-up by: Judith Part MD,  June 20, 2008 4:40 PM  Additional Follow-up for Phone Call Additional follow up Details #1::        Called patient at work as requested and got her voicemail. Left message for patient to call back. The Prescription for lab orders is at the front desk in an envelope with patient's name. Additional Follow-up by: Lewanda Rife,  June 20, 2008 5:00 PM    Additional Follow-up for Phone Call Additional follow up Details #2::    LEFT DETAILED MESSAGE ON MACHINE Liane Comber  June 21, 2008 8:56 AM

## 2010-02-15 NOTE — Assessment & Plan Note (Signed)
Summary: URI   Vital Signs:  Patient Profile:   50 Years Old Female Weight:      212 pounds Temp:     99.4 degrees F oral Pulse rate:   95 / minute BP sitting:   120 / 83  (left arm) Cuff size:   regular  Vitals Entered By: Cooper Render (March 17, 2007 1:06 PM)                 Chief Complaint:  URI sx and taking delsym.  History of Present Illness: Here for URI sx since onset 03/12/07--nasal congestion, chest congestion, wheezing, fever body ache, worse 3/1--with increased cough, fever 100.6 yesterday.  Taking Delsym--helping, Mucinex, Afrin q12h --started last night.  Mother has the same thing--lives together.  Son had last week.    Current Allergies (reviewed today): ! ERYTHROMYCIN ASA NSAIDS     Review of Systems      See HPI   Physical Exam  General:     alert, well-developed, well-nourished, and well-hydrated.  NAD, lying on exam table Eyes:     pupils equal, pupils round, and no injection.   Ears:     TMs retracted with increased fluid bilat Nose:     no airflow obstruction, mucosal erythema, and mucosal edema.   Mouth:     no exudates and pharyngeal erythema.   Lungs:     moist harsh cought, no crackles and no wheezes.   Neurologic:     alert & oriented X3 and gait normal.   Skin:     turgor normal, color normal, and no rashes.   Cervical Nodes:     no anterior cervical adenopathy and no posterior cervical adenopathy.  shotty tonsilar Psych:     normally interactive and good eye contact.      Impression & Recommendations:  Problem # 1:  BRONCHITIS-ACUTE (ICD-466.0) Assessment: New continue comfort care measures: increase po fluids, rest, tylenol or IBP as needed will start Amoxicillin x7d hycocan at bedtime and repeat in 6 h if needed for cough see back in 5-7d if not improved. Her updated medication list for this problem includes:    Amoxicillin 500 Mg Tabs (Amoxicillin) .Marland Kitchen... 2 tabs two times a day--take all    Hycodan 5-1.5 Mg/5ml  Syrp (Hydrocodone-homatropine) .Marland Kitchen... 1 tsp q6h prncough   Complete Medication List: 1)  Norvasc 5 Mg Tabs (Amlodipine besylate) .Marland Kitchen.. 1 by mouth bid 2)  Fluticasone Propionate 50 Mcg/act Susp (Fluticasone propionate) 3)  Simvastatin 20 Mg Tabs (Simvastatin) 4)  Mercaptopurine 50 Mg Tabs (Mercaptopurine) 5)  Tramadol Hcl 50 Mg Tabs (Tramadol hcl) .Marland Kitchen.. 1 by mouth three times a day prn 6)  Synthroid 112 Mcg Tabs (Levothyroxine sodium) .Marland Kitchen.. 1 by mouth bid 7)  Wellbutrin Xl 150 Mg Tb24 (Bupropion hcl) .... 3 by mouth qd 8)  Prilosec 20 Mg Cpdr (Omeprazole) .... Two times a day 9)  Synthroid 25 Mcg Tabs (Levothyroxine sodium) .... Take 1 tablet by mouth once a day 10)  Effexor Xr 75 Mg Cp24 (Venlafaxine hcl) .... Take 3 by mouth qd 11)  Trazodone Hcl 150 Mg Tabs (Trazodone hcl) .... 3 by mouth qhs 12)  Melatonin 3 Mg Caps (Melatonin) .... At bedtime 13)  Adderall 30 Mg Tabs (Amphetamine-dextroamphetamine) .... .qdtab 14)  Adderall 10 Mg Tabs (Amphetamine-dextroamphetamine) .Marland Kitchen.. 1- 2 tabs at lunch 15)  Abilify 10 Mg Tabs (Aripiprazole) .... Take 1 tablet by mouth once a day 16)  Buspar 15 Mg Tabs (Buspirone hcl) .... Take  1 tablet by mouth qid 17)  Flexeril 10 Mg Tabs (Cyclobenzaprine hcl) .... 2 qhs 18)  Hydrochlorothiazide 25 Mg Tabs (Hydrochlorothiazide) .... Take one by mouth once a day 19)  Potassium Chloride Cr 10 Meq Cpcr (Potassium chloride) .... Take one by mouth qd 20)  Amoxicillin 500 Mg Tabs (Amoxicillin) .... 2 tabs two times a day--take all 21)  Hycodan 5-1.5 Mg/43ml Syrp (Hydrocodone-homatropine) .Marland Kitchen.. 1 tsp q6h prncough     Prescriptions: HYCODAN 5-1.5 MG/5ML  SYRP (HYDROCODONE-HOMATROPINE) 1 tsp q6h prncough  #189ml x 0   Entered and Authorized by:   Gildardo Griffes FNP   Signed by:   Gildardo Griffes FNP on 03/17/2007   Method used:   Print then Give to Patient   RxID:   [id number] AMOXICILLIN 500 MG  TABS (AMOXICILLIN) 2 tabs two times a day--take  all  #28 x 0   Entered and Authorized by:   Gildardo Griffes FNP   Signed by:   Gildardo Griffes FNP on 03/17/2007   Method used:   Print then Give to Patient   RxID:   [id number]  ] Prior Medications (reviewed today): NORVASC 5 MG TABS (AMLODIPINE BESYLATE) 1 by mouth bid FLUTICASONE PROPIONATE 50 MCG/ACT SUSP (FLUTICASONE PROPIONATE)  SIMVASTATIN 20 MG TABS (SIMVASTATIN)  MERCAPTOPURINE 50 MG TABS (MERCAPTOPURINE)  TRAMADOL HCL 50 MG TABS (TRAMADOL HCL) 1 by mouth three times a day prn SYNTHROID 112 MCG  TABS (LEVOTHYROXINE SODIUM) 1 by mouth bid WELLBUTRIN XL 150 MG  TB24 (BUPROPION HCL) 3 by mouth qd PRILOSEC 20 MG  CPDR (OMEPRAZOLE) two times a day SYNTHROID 25 MCG  TABS (LEVOTHYROXINE SODIUM) Take 1 tablet by mouth once a day EFFEXOR XR 75 MG  CP24 (VENLAFAXINE HCL) Take 3 by mouth qd TRAZODONE HCL 150 MG  TABS (TRAZODONE HCL) 3 by mouth qhs MELATONIN 3 MG  CAPS (MELATONIN) at bedtime ADDERALL 30 MG  TABS (AMPHETAMINE-DEXTROAMPHETAMINE) .qdtab ADDERALL 10 MG  TABS (AMPHETAMINE-DEXTROAMPHETAMINE) 1- 2 tabs at lunch ABILIFY 10 MG  TABS (ARIPIPRAZOLE) Take 1 tablet by mouth once a day BUSPAR 15 MG  TABS (BUSPIRONE HCL) Take 1 tablet by mouth qid FLEXERIL 10 MG  TABS (CYCLOBENZAPRINE HCL) 2 qhs HYDROCHLOROTHIAZIDE 25 MG  TABS (HYDROCHLOROTHIAZIDE) Take one by mouth once a day POTASSIUM CHLORIDE CR 10 MEQ  CPCR (POTASSIUM CHLORIDE) take one by mouth qd Current Allergies (reviewed today): ! ERYTHROMYCIN ASA NSAIDS

## 2010-02-15 NOTE — Progress Notes (Signed)
Summary: refill request for darvocet  Phone Note Refill Request Message from:  Fax from Pharmacy  Refills Requested: Medication #1:  DARVOCET A500 100-500 MG  TABS 1 by mouth q 6 hours as needed pain   Last Refilled: 03/18/2008 [facility]ed [facility] [place], [phone].  Initial call taken by: Marty Heck CMA,  November 29, 2008 2:08 PM  Follow-up for Phone Call        px written on EMR for call in  Follow-up by: Allena Earing MD,  November 29, 2008 2:26 PM  Additional Follow-up for Phone Call Additional follow up Details #1::        Medication phoned to pharmacy.  Additional Follow-up by: Christena Deem CMA ([place]),  November 29, 2008 2:51 PM    Prescriptions: DARVOCET A500 100-500 MG  TABS (PROPOXYPHENE N-APAP) 1 by mouth q 6 hours as needed pain  #60 x 0   Entered and Authorized by:   Allena Earing MD   Signed by:   Christena Deem CMA ([place]) on 11/29/2008   Method used:   Telephoned to ...       [facility] [phone]* (retail)       [address]       [place], [place]  [address]       Ph: [phone]       Fax: [phone]   RxID:   [id number]

## 2010-02-15 NOTE — Progress Notes (Signed)
Summary: refill request for tramadol  Phone Note Refill Request Message from:  Patient  Refills Requested: Medication #1:  TRAMADOL HCL 50 MG TABS 1 by mouth three times a day prn Phoned request from pt, script needs to be changed-  pt states she is taking 2 three times a day now.  [facility] [place] [phone].  Initial call taken by: Marty Heck,  July 15, 2008 11:37 AM  Follow-up for Phone Call        I will change the px let her know this is a habit forming med-- only take as much as she absolutely needs is also sedating - caution with driving,etc  px written on EMR for call in  Follow-up by: Allena Earing MD,  July 15, 2008 1:21 PM  Additional Follow-up for Phone Call Additional follow up Details #1::        called to pharmacy talked with pharmacist// told to tell her that medication is sedating and habit forming No answer at patients home number. Additional Follow-up by: Gwinda Maine,  July 15, 2008 4:31 PM    New/Updated Medications: TRAMADOL HCL 50 MG TABS (TRAMADOL HCL) 1-2 by mouth three times a day as needed   Prescriptions: TRAMADOL HCL 50 MG TABS (TRAMADOL HCL) 1-2 by mouth three times a day as needed  #3 month sup x 0   Entered and Authorized by:   Allena Earing MD   Signed by:   Allena Earing MD on 07/15/2008   Method used:   Telephoned to ...       [facility] [phone]* (retail)       [address]       [place], [place]  [address]       Ph: [phone]       Fax: [phone]   RxID:   [id number]

## 2010-02-15 NOTE — Progress Notes (Signed)
Summary: RX Flexeril  Phone Note Refill Request Call back at [phone] Message from:  [id number] on May 18, 2008 12:09 PM  Refills Requested: Medication #1:  FLEXERIL 10 MG  TABS 2 by mouth at bedtime as needed   Last Refilled: 04/08/2008 Received e-scribe request.   Method Requested: Electronic Initial call taken by: Emelia Salisbury,  May 18, 2008 12:11 PM  Follow-up for Phone Call        will refill--Billie-Lynn Fabian Sharp FNP  May 18, 2008 12:35 PM       Prescriptions: FLEXERIL 10 MG  TABS (CYCLOBENZAPRINE HCL) 2 by mouth at bedtime as needed  #180 x 3   Entered and Authorized by:   Dixie Dials FNP   Signed by:   Dixie Dials FNP on 05/18/2008   Method used:   Electronically to        [facility]. [phone]* (retail)       [address]       [place], [place]  [address]       Ph: [phone]       Fax: [phone]   RxID:   [id number]

## 2010-02-15 NOTE — Assessment & Plan Note (Signed)
Summary: FEELING BAD, HEAD, , ETC CYD   Vital Signs:  Patient profile:   50 year old female Height:      65.25 inches Weight:      223.38 pounds BMI:     37.02 Temp:     98.4 degrees F oral Pulse rate:   84 / minute Pulse rhythm:   regular BP sitting:   116 / 86  (right arm) Cuff size:   large  Vitals Entered By: Christena Deem CMA (AAMA) (December 13, 2008 12:22 PM) CC: Feeling bad, head congestion   History of Present Illness: 50 yo with progressively worsening nasal congestion and sinus pressure for almost a week. Subjective fever, chills.  Dry cough.  NO wheezing.  No shortness of breath.  Current Medications (verified): 1)  Norvasc 5 Mg Tabs (Amlodipine Besylate) .Marland Kitchen.. 1 By Mouth Two Times A Day 2)  Fluticasone Propionate 50 Mcg/act Susp (Fluticasone Propionate) .... 2 Sprays in Each Nostril Once Daily 3)  Simvastatin 20 Mg Tabs (Simvastatin) .Marland Kitchen.. 1 By Mouth Once Daily 4)  Tramadol Hcl 50 Mg Tabs (Tramadol Hcl) .Marland Kitchen.. 1-2 By Mouth Three Times A Day As Needed 5)  Synthroid 112 Mcg Tabs (Levothyroxine Sodium) .... Two Tablets By Mouth Once Daily 6)  Wellbutrin Xl 150 Mg  Tb24 (Bupropion Hcl) .... 3 By Mouth Qd 7)  Venlafaxine Hcl 75 Mg Tabs (Venlafaxine Hcl) .... Take 1 Tablet By Mouth Once A Day 8)  Trazodone Hcl 150 Mg  Tabs (Trazodone Hcl) .... One By Mouth At Bedtime 9)  Adderall Xr 30 Mg Xr24h-Cap (Amphetamine-Dextroamphetamine) .... Take 1 Capsule By Mouth Two Times A Day 10)  Abilify 10 Mg  Tabs (Aripiprazole) .... Take 1 Tablet By Mouth Once A Day 11)  Buspar 15 Mg  Tabs (Buspirone Hcl) .... Take One Tablet By Mouth At Bedtime, May Repeat X 1 As Needed and As Needed Anxiety 12)  Flexeril 10 Mg  Tabs (Cyclobenzaprine Hcl) .... 2 By Mouth At Bedtime As Needed 13)  Potassium Chloride Cr 10 Meq  Cpcr (Potassium Chloride) .... Take One By Mouth Once Daily (Holding 6/10) 14)  Darvocet A500 100-500 Mg  Tabs (Propoxyphene N-Apap) .Marland Kitchen.. 1 By Mouth Q 6 Hours As Needed Pain 15)   Frova 2.5 Mg  Tabs (Frovatriptan Succinate) .... Take 1-2 As Needed For Migraines 16)  Chlorpromazine Hcl 25 Mg  Tabs (Chlorpromazine Hcl) .... Take 1-2 Three Times A Day As Needed For Migraines If Frova Doesnt Help 17)  Caltrate 600+d 600-400 Mg-Unit  Tabs (Calcium Carbonate-Vitamin D) .... Take One By Mouth Twice A Day 18)  Protonix 40 Mg  Tbec (Pantoprazole Sodium) .... Take 1 Tablet By Mouth Once A Day. 19)  Macrodantin .... As Needed With Sexual Activity As Directed 20)  Valtrex 500 Mg Tabs (Valacyclovir Hcl) .Marland Kitchen.. 1 Tablet By Mouth Twice Daily 21)  Dulcolax 5 Mg Tbec (Bisacodyl) .... 3 At Bedtime 22)  Amitriptyline Hcl 50 Mg Tabs (Amitriptyline Hcl) .... One Tab By Mouth At Bedtime 23)  Solodyn 90 Mg Xr24h-Tab (Minocycline Hcl) .... By Mouth As Needed Acne 24)  Multivitamins   Tabs (Multiple Vitamin) .... Take 1 Tablet By Mouth Once A Day 25)  Phazyme 125 Mg Chew (Simethicone) .... One By Mouth Three Times A Day 26)  Spironolactone 25 Mg Tabs (Spironolactone) .Marland Kitchen.. 1 By Mouth Once Daily 27)  Vitamin D (Ergocalciferol) 50000 Unit Caps (Ergocalciferol) .... Take Once A Week For 10 Weeks 28)  Vitamin D 2000 Units .... Take By Mouth  Daily 29)  Augmentin 875-125 Mg Tabs (Amoxicillin-Pot Clavulanate) .Marland Kitchen.. 1 By Mouth 2 Times Daily  Allergies: 1)  ! Erythromycin 2)  ! * Ephedrine 3)  ! * Coconut 4)  Asa 5)  Nsaids  Review of Systems      See HPI General:  Complains of chills and fever. ENT:  Complains of postnasal drainage and sinus pressure; denies earache and sore throat. Resp:  Complains of cough; denies shortness of breath, sputum productive, and wheezing. GI:  Denies diarrhea, nausea, and vomiting.  Physical Exam  General:  overweight but generally well appearing  Head:  TTP over frontal sinuses. Ears:  R ear normal and L ear normal.   Nose:  no nasal discharge.  - nares are boggy Mouth:  pharynx pink and moist.   Lungs:  Normal respiratory effort, chest expands symmetrically.  Lungs are clear to auscultation, no crackles or wheezes. Heart:  Normal rate and regular rhythm. S1 and S2 normal without gallop, murmur, click, rub or other extra sounds. Abdomen:  Bowel sounds positive,abdomen soft and non-tender without masses, organomegaly or hernias noted. no renal bruits    Impression & Recommendations:  Problem # 1:  OTHER ACUTE SINUSITIS (ICD-461.8) Assessment New Given duration of symptoms, will treat with Augmentin.  Continue supportive care.  See patient instructions for details. Her updated medication list for this problem includes:    Fluticasone Propionate 50 Mcg/act Susp (Fluticasone propionate) .Marland Kitchen... 2 sprays in each nostril once daily    Solodyn 90 Mg Xr24h-tab (Minocycline hcl) ..... By mouth as needed acne    Augmentin 875-125 Mg Tabs (Amoxicillin-pot clavulanate) .Marland Kitchen... 1 by mouth 2 times daily  Complete Medication List: 1)  Norvasc 5 Mg Tabs (Amlodipine besylate) .Marland Kitchen.. 1 by mouth two times a day 2)  Fluticasone Propionate 50 Mcg/act Susp (Fluticasone propionate) .... 2 sprays in each nostril once daily 3)  Simvastatin 20 Mg Tabs (Simvastatin) .Marland Kitchen.. 1 by mouth once daily 4)  Tramadol Hcl 50 Mg Tabs (Tramadol hcl) .Marland Kitchen.. 1-2 by mouth three times a day as needed 5)  Synthroid 112 Mcg Tabs (Levothyroxine sodium) .... Two tablets by mouth once daily 6)  Wellbutrin Xl 150 Mg Tb24 (Bupropion hcl) .... 3 by mouth qd 7)  Venlafaxine Hcl 75 Mg Tabs (Venlafaxine hcl) .... Take 1 tablet by mouth once a day 8)  Trazodone Hcl 150 Mg Tabs (Trazodone hcl) .... One by mouth at bedtime 9)  Adderall Xr 30 Mg Xr24h-cap (Amphetamine-dextroamphetamine) .... Take 1 capsule by mouth two times a day 10)  Abilify 10 Mg Tabs (Aripiprazole) .... Take 1 tablet by mouth once a day 11)  Buspar 15 Mg Tabs (Buspirone hcl) .... Take one tablet by mouth at bedtime, may repeat x 1 as needed and as needed anxiety 12)  Flexeril 10 Mg Tabs (Cyclobenzaprine hcl) .... 2 by mouth at bedtime as  needed 13)  Potassium Chloride Cr 10 Meq Cpcr (Potassium chloride) .... Take one by mouth once daily (holding 6/10) 14)  Darvocet A500 100-500 Mg Tabs (Propoxyphene n-apap) .Marland Kitchen.. 1 by mouth q 6 hours as needed pain 15)  Frova 2.5 Mg Tabs (Frovatriptan succinate) .... Take 1-2 as needed for migraines 16)  Chlorpromazine Hcl 25 Mg Tabs (Chlorpromazine hcl) .... Take 1-2 three times a day as needed for migraines if frova doesnt help 17)  Caltrate 600+d 600-400 Mg-unit Tabs (Calcium carbonate-vitamin d) .... Take one by mouth twice a day 18)  Protonix 40 Mg Tbec (Pantoprazole sodium) .... Take 1 tablet by  mouth once a day. 19)  Macrodantin  .... As needed with sexual activity as directed 20)  Valtrex 500 Mg Tabs (Valacyclovir hcl) .Marland Kitchen.. 1 tablet by mouth twice daily 21)  Dulcolax 5 Mg Tbec (Bisacodyl) .... 3 at bedtime 22)  Amitriptyline Hcl 50 Mg Tabs (Amitriptyline hcl) .... One tab by mouth at bedtime 23)  Solodyn 90 Mg Xr24h-tab (Minocycline hcl) .... By mouth as needed acne 24)  Multivitamins Tabs (Multiple vitamin) .... Take 1 tablet by mouth once a day 25)  Phazyme 125 Mg Chew (Simethicone) .... One by mouth three times a day 26)  Spironolactone 25 Mg Tabs (Spironolactone) .Marland Kitchen.. 1 by mouth once daily 27)  Vitamin D (ergocalciferol) 50000 Unit Caps (Ergocalciferol) .... Take once a week for 10 weeks 28)  Vitamin D 2000 Units  .... Take by mouth daily 29)  Augmentin 875-125 Mg Tabs (Amoxicillin-pot clavulanate) .Marland Kitchen.. 1 by mouth 2 times daily  Patient Instructions: 1)  Take full coure of Augmentin. 2)   Use warm moist compresses, and over the counter decongestants( only as directed). Call if no improvement in 5-7 days, sooner if increasing pain, fever, or new symptoms.  Prescriptions: AUGMENTIN 875-125 MG TABS (AMOXICILLIN-POT CLAVULANATE) 1 by mouth 2 times daily  #20 x 0   Entered and Authorized by:   Arnette Norris MD   Signed by:   Arnette Norris MD on 12/13/2008   Method used:   Electronically to          [facility]. [phone]* (retail)       [address]       [place], [place]  [address]       Ph: [phone]       Fax: [phone]   RxID:   [id number]   Current Allergies (reviewed today): ! ERYTHROMYCIN ! * EPHEDRINE ! * COCONUT ASA NSAIDS

## 2010-02-15 NOTE — Medication Information (Signed)
Summary: Possible Interaction Between Cyclobenzaprine & Tramadol[facility]  Possible Interaction Between Cyclobenzaprine & Tramadol/[facility]   Imported By: Lanelle Bal 05/23/2009 10:40:10  _____________________________________________________________________  External Attachment:    Type:   Image     Comment:   External Document

## 2010-02-15 NOTE — Miscellaneous (Signed)
Summary: Controlled Substance Agreement  Controlled Substance Agreement   Imported By: Lanelle Bal 08/14/2009 14:15:32  _____________________________________________________________________  External Attachment:    Type:   Image     Comment:   External Document

## 2010-02-15 NOTE — Miscellaneous (Signed)
Summary: MGM results   Clinical Lists Changes  Observations: Added new observation of MAMMO DUE: 07/2008 (07/21/2007 11:51) Added new observation of MAMMOGRAM: normal (07/20/2007 11:52)       Preventive Care Screening  Mammogram:    Date:  07/20/2007    Next Due:  07/2008    Results:  normal

## 2010-02-15 NOTE — Assessment & Plan Note (Signed)
Summary: CPX/CLE   Vital Signs:  Patient profile:   50 year old female Height:      65.25 inches Weight:      219.25 pounds BMI:     36.34 Temp:     98.5 degrees F oral Pulse rate:   76 / minute Pulse rhythm:   regular BP sitting:   120 / 82  (left arm) Cuff size:   large  Vitals Entered By: Lewanda Rife LPN (October 13, 2009 9:52 AM) CC: CPX GYN does pap and breast exam   History of Present Illness: here for health mt exam   is stressed- taking care of elderly mother   is feeling ok in general   wt is up 6 lb has cyst on achilles tendon - saw Dr Hyacinth Meeker- using voltaren gel - is working a little  cannot walk regularly now  can swim - and is planning to do that -- plans after next pay day  chronic knee pain  furthe plan for getting the wt down includes wt watchers - more stringently  has huge sweet tooth    bp is 120/82  colon polyp 12/08-- due in 2012 also UC-- ? pt unsure will call GI   lipid- pretty good with trig 108 and HDL 54 and LdL 90   gyn care - will be oct 13th  pap last sept nl  mam july 9th 2010 - was nl   thyroid - low tsh - will disc with endo   glucose 110- is on abilify - watching that  needs to get sweet tooth under control     just had dexa last year was normal ca and D  Td 03 ptx 09 flu - shot today   Allergies: 1)  ! Erythromycin 2)  ! * Ephedrine 3)  ! * Coconut 4)  ! Crestor 5)  Asa 6)  Nsaids  Past History:  Past Surgical History: Last updated: 08/23/2008 Hospital- wisom teeth removed (0454'U) Tonsillectomy- (1984) Caesarean section- placenta previa, gest DM, pre- eclampsia (1996/1997) Sinus surgery (05/1997) Pre cancerous mole removed Cholecystectomy, adhesions also (1997) Meckel's diverticulum- surgery (1999) Breast biopsy- on tamoxifen, atypical hyperplasia Dexa- normal (04/1999), dexa nl 2010 Colonoscopy- ulcerative colitis (01/2001) EGD- polyp (09/20023) Exercise stress test- neg (11/2003)   nuclear stress  test neg 6/09 Colonoscopy- UC, polyp (04/2004) colonoscopy ulcerative colitis, no polyps 12/08 (11/09) sleep study - no apnea, but did snore (done by [facility])  Family History: Last updated: 2007/06/21 father CAD, died of colon cancer, ALZ Mother CAD, HTN, OP great aunts had breast cancer  uncle with CAD and AAA cousin with CVA some remote DM 2   Social History: Last updated: 06/22/2008 Marital Status: Married Children: 2--11-12 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  recovered ETOH quit smoking 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   Risk Factors: Smoking Status: quit (01/07/2007)  Past Medical History: Current Problems:  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) HIATAL HERNIA (ICD-553.3) COLONIC POLYPS, ADENOMATOUS, HX OF (ICD-V12.72) ADENOCARCINOMA, COLON, FAMILY HX (ICD-V16.0) EDEMA- LOCALIZED (ICD-782.3) 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) HYPERCHOLESTEROLEMIA (ICD-272.0) MIGRAINES, HX OF (ICD-V13.8) COLITIS, ULCERATIVE (ICD-556.9) HYPOTHYROIDISM (ICD-244.9) HYPERTENSION (ICD-401.9) GERD (ICD-530.81) DEPRESSION (ICD-311) ASTHMA (ICD-493.90) ANXIETY (ICD-300.00) ALLERGIC RHINITIS (ICD-477.9) FIBROMYALGIA (ICD-729.1) INSOMNIA (ICD-780.52) DYSURIA (ICD-788.1) acne hx of ovarian cysts  cyst on achilles tendon   GI-- Dr Juanda Chance gyn-- Dr Luella Cook derm---Dr Roseanne Reno psych-- Dr Evelene Croon  endo--Dr Lucianne Muss  rheum- Dr Kellie Simmering ortho- [place]   Review of Systems General:  Complains of fatigue; denies loss of  appetite and malaise. Eyes:  Denies blurring and eye irritation. ENT:  Complains of postnasal drainage. CV:  Denies chest pain or discomfort, lightheadness, palpitations, and shortness of breath with exertion. Resp:  Denies cough and wheezing. GI:  Denies abdominal pain and constipation. GU:  Denies abnormal vaginal bleeding, discharge, and dysuria. MS:  Complains of muscle aches and stiffness; denies joint redness and joint swelling; fibro is about  the same. Derm:  Denies itching, lesion(s), poor wound healing, and rash. Neuro:  Denies numbness and tingling. Psych:  mood is fairly stable on current meds . Endo:  Denies cold intolerance, excessive thirst, excessive urination, and heat intolerance. Heme:  Denies abnormal bruising and bleeding.  Physical Exam  General:  obese and well appearing  Head:  normocephalic, atraumatic, and no abnormalities observed.   Eyes:  vision grossly intact, pupils equal, pupils round, and pupils reactive to light.  no conjunctival pallor, injection or icterus  Ears:  R ear normal and L ear normal.   Nose:  no nasal discharge.   Mouth:  pharynx pink and moist.   Neck:  supple with full rom and no masses or thyromegally, no JVD or carotid bruit  Chest Wall:  No deformities, masses, or tenderness noted. Lungs:  Normal respiratory effort, chest expands symmetrically. Lungs are clear to auscultation, no crackles or wheezes. Heart:  Normal rate and regular rhythm. S1 and S2 normal without gallop, murmur, click, rub or other extra sounds. Abdomen:  Bowel sounds positive,abdomen soft and non-tender without masses, organomegaly or hernias noted. no renal bruits  Msk:  No deformity or scoliosis noted of thoracic or lumbar spine.  no acute joint changes  Pulses:  R and L carotid,radial,femoral,dorsalis pedis and posterior tibial pulses are full and equal bilaterally Extremities:  No clubbing, cyanosis, edema, or deformity noted with normal full range of motion of all joints.   Neurologic:  sensation intact to light touch, gait normal, and DTRs symmetrical and normal.   Skin:  Intact without suspicious lesions or rashes few SKs ruddy complexion Cervical Nodes:  No lymphadenopathy noted Inguinal Nodes:  No significant adenopathy Psych:  normal affect, talkative and pleasant    Impression & Recommendations:  Problem # 1:  HEALTH MAINTENANCE EXAM (ICD-V70.0) Assessment Comment Only reviewed health habits  including diet, exercise and skin cancer prevention reviewed health maintenance list and family history disc imp of wt loss  rev labs in detail  Problem # 2:  UNSPECIFIED VITAMIN D DEFICIENCY (ICD-268.9) Assessment: Improved vit D is low nl- will continue dose recent dexa ok  Problem # 3:  HYPERCHOLESTEROLEMIA (ICD-272.0) Assessment: Unchanged  this is very well controlled on lipitor rev low sat fat diet  Her updated medication list for this problem includes:    Lipitor 40 Mg Tabs (Atorvastatin calcium) .Marland Kitchen... 1/2 by mouth once daily  Labs Reviewed: SGOT: 26 (11/24/2008)   SGPT: 36 (11/24/2008)   HDL:46.80 (11/24/2008), 43.80 (07/21/2008)  LDL:114 (11/24/2008), 114 (07/21/2008)  Chol:181 (11/24/2008), 191 (07/21/2008)  Trig:100.0 (11/24/2008), 167.0 (07/21/2008)  Problem # 4:  HYPOTHYROIDISM (ICD-244.9) Assessment: Deteriorated tsh low to f/u with endo The following medications were removed from the medication list:    Synthroid 125 Mcg Tabs (Levothyroxine sodium) .Marland Kitchen... Take two tablets by mouth once daily Her updated medication list for this problem includes:    Synthroid 88 Mcg Tabs (Levothyroxine sodium) .Marland Kitchen... Take two tablets by mouth daily  Problem # 5:  HYPERTENSION (ICD-401.9) Assessment: Unchanged fine on current meds rev labs disc healthy diet (low  simple sugar/ choose complex carbs/ low sat fat) diet and exercise in detail  Her updated medication list for this problem includes:    Norvasc 5 Mg Tabs (Amlodipine besylate) .Marland Kitchen... 1 by mouth two times a day    Spironolactone 25 Mg Tabs (Spironolactone) .Marland Kitchen... 1 by mouth twice daily  BP today: 120/82 Prior BP: 120/80 (08/09/2009)  Labs Reviewed: K+: 4.3 (06/22/2008) Creat: : 0.7 (06/22/2008)   Chol: 181 (11/24/2008)   HDL: 46.80 (11/24/2008)   LDL: 114 (11/24/2008)   TG: 100.0 (11/24/2008)  Complete Medication List: 1)  Norvasc 5 Mg Tabs (Amlodipine besylate) .Marland Kitchen.. 1 by mouth two times a day 2)  Fluticasone  Propionate 50 Mcg/act Susp (Fluticasone propionate) .... 2 sprays in each nostril twice daily 3)  Tramadol Hcl 50 Mg Tabs (Tramadol hcl) .... Take one tablet by mouth in am, one tablet by mouth at lunch and take two tablets by mouth at bedtime 4)  Wellbutrin Xl 150 Mg Tb24 (Bupropion hcl) .... 3 by mouth qd 5)  Venlafaxine Hcl 75 Mg Tabs (Venlafaxine hcl) .... Take 1 tablet by mouth twice a day 6)  Trazodone Hcl 150 Mg Tabs (Trazodone hcl) .... One by mouth at bedtime 7)  Abilify 10 Mg Tabs (Aripiprazole) .... Take 1 tablet by mouth once a day 8)  Buspar 15 Mg Tabs (Buspirone hcl) .... Take one tablet by mouth at bedtime, may repeat x 1 as needed and as needed anxiety 9)  Flexeril 10 Mg Tabs (Cyclobenzaprine hcl) .... 2 by mouth at bedtime 10)  Frova 2.5 Mg Tabs (Frovatriptan succinate) .... Take 1-2 as needed for migraines 11)  Chlorpromazine Hcl 25 Mg Tabs (Chlorpromazine hcl) .... Take 1-2 three times a day as needed for migraines if frova doesnt help 12)  Protonix 40 Mg Tbec (Pantoprazole sodium) .... Take 1 tablet by mouth once a day. 13)  Macrodantin  .... As needed with sexual activity as directed 14)  Valtrex 500 Mg Tabs (Valacyclovir hcl) .Marland Kitchen.. 1 tablet by mouth twice daily 15)  Dulcolax 5 Mg Tbec (Bisacodyl) .Marland Kitchen.. 1 at bedtime 16)  Amitriptyline Hcl 50 Mg Tabs (Amitriptyline hcl) .... Five tablets by mouth at bedtime 17)  Multivitamins Tabs (Multiple vitamin) .... Take 1 tablet by mouth once a day 18)  Phazyme 125 Mg Chew (Simethicone) .... One by mouth three times a day 19)  Spironolactone 25 Mg Tabs (Spironolactone) .Marland Kitchen.. 1 by mouth twice daily 20)  Adderall 20 Mg Tabs (Amphetamine-dextroamphetamine) .... Take one tablet three times a day 21)  Lipitor 40 Mg Tabs (Atorvastatin calcium) .... 1/2 by mouth once daily 22)  Synthroid 88 Mcg Tabs (Levothyroxine sodium) .... Take two tablets by mouth daily 23)  Viactiv Multi-vitamin Chew (Multiple vitamins-calcium) .... Two twice daily 24)   Voltaren 1 % Gel (Diclofenac sodium) .... Apply topically four times a day as needed for pain  Other Orders: Admin 1st Vaccine (41324) Flu Vaccine 69yrs + (40102)  Patient Instructions: 1)  tsh is low- discuss with your endocrinologist  2)  vit D level is low normal  3)  flu shot today 4)  follow up with gyn as planned  5)  work on weight loss  6)  swimming !  - talk to Dr Hyacinth Meeker about your knees - ? what you can do 7)  get off the sweets  Prescriptions: TRAMADOL HCL 50 MG TABS (TRAMADOL HCL) take one tablet by mouth in AM, one tablet by mouth at lunch and take two tablets by mouth at  bedtime  #120 x 0   Entered and Authorized by:   Judith Part MD   Signed by:   Judith Part MD on 10/13/2009   Method used:   Print then Give to Patient   RxID:   [id number] FLUTICASONE PROPIONATE 50 MCG/ACT SUSP (FLUTICASONE PROPIONATE) 2 sprays in each nostril twice daily  #1 mdi x 11   Entered and Authorized by:   Judith Part MD   Signed by:   Judith Part MD on 10/13/2009   Method used:   [facility]ronically to        [facility] [phone][facility] (retail)       [address]       [place], [place]  [address]       Ph: [phone]       Fax: [phone]   RxID:   [id number] NORVASC 5 MG TABS (AMLODIPINE BESYLATE) 1 by mouth two times a day  #60 x 11   Entered and Authorized by:   Judith Part MD   Signed by:   Judith Part MD on 10/13/2009   Method used:   [facility]ically to        [facility] [phone][facility] (retail)       [address]       [place], [place]  [address]       Ph: [phone]       Fax: [phone]   RxID:   [id number]   Current Allergies (reviewed today): ! ERYTHROMYCIN ! * EPHEDRINE ! * COCONUT ! CRESTOR ASA NSAIDS   Preventive Care Screening  Last Flu Shot:    Date:  10/13/2009    Results:  Fluvax 3+  Mammogram:    Date:  07/22/2009    Results:  normal   Pap Smear:    Date:  09/14/2008    Results:  normal    Bone Density:    Date:  07/14/2008    Results:  normal std dev    Flu Vaccine Consent Questions     Do you have a history of severe allergic reactions to this vaccine? no    Any prior history of allergic reactions to egg and/or gelatin? no    Do you have a sensitivity to the preservative Thimersol? no    Do you have a past history of Guillan-Barre Syndrome? no    Do you currently have an acute febrile illness? no    Have you ever had a severe reaction to latex? no    Vaccine information given and explained to patient? yes    Are you currently pregnant? no    Lot Number:[id number]   Exp Date:07/14/2010   Site Given  Left Deltoid IMlbflu  Lewanda Rife LPN  October 13, 2009 10:33 AM

## 2010-02-15 NOTE — Progress Notes (Signed)
Summary: wants to increase tramadol dose  Phone Note Call from Patient Call back at Work Phone [phone]   Caller: Patient Summary of Call: Pt is asking if ok to increase her tramadol from one three times a day to 2 three times a day, she says one doesnt control her fibromyalgia pain.  She doesnt need a refill at this time, just wants to know if ok to increase the dose. Initial call taken by: Marty Heck,  July 01, 2008 9:54 AM  Follow-up for Phone Call        can increase tramadol to 2tabs 3 times once daily as needed--try to use this increased dose as little as possible---it does have a slight addicting property Billie-Lynn Fabian Sharp FNP  July 01, 2008 11:29 AM   Patient notified as instructed by telephone. Follow-up by: Ozzie Hoyle,  July 01, 2008 11:33 AM

## 2010-02-15 NOTE — Progress Notes (Signed)
Summary: Tramadol HCL 50mg    Phone Note Refill Request Call back at [phone] Message from:  [id number] on August 14, 2009 2:52 PM  Refills Requested: Medication #1:  TRAMADOL HCL 50 MG TABS take one tablet by mouth in AM [facility] [address] [phone] electronically request refill on Tramadol HCL 50mg  take one to two tabs three times a day as needed. No refill date sent. I tried to reach pt to see if she was in pain and where the pain was but unable to reach pt by phone. .Please advise.    Method Requested: Telephone to Pharmacy Initial call taken by: Lewanda Rife LPN,  August 14, 2009 2:53 PM  Follow-up for Phone Call        find out on average how many she uses per day and whether she needs 3 mo supply please  Follow-up by: Judith Part MD,  August 14, 2009 3:17 PM  Additional Follow-up for Phone Call Additional follow up Details #1::        Patient notified as instructed by telephone. Pt said she takes one in the am, one at lunch and two at bedtime so she takes 4 tabs dailly. Pt said monthly rx was fine with her. Pt does not need to pick up until Tues. 08/15/09.Lewanda Rife LPN  August 14, 2009 5:05 PM   ok--px written on EMR for call in  Additional Follow-up by: Judith Part MD,  August 14, 2009 9:50 PM    Additional Follow-up for Phone Call Additional follow up Details #2::    [facility]ication [facility]oned to [facility] as instructed. Lewanda Rife LPN  August 15, 2009 8:28 AM   New/Updated Medications: TRAMADOL HCL 50 MG TABS (TRAMADOL HCL) take one tablet by mouth in AM, one tablet by mouth at lunch and take two tablets by mouth at bedtime as needed pain Prescriptions: TRAMADOL HCL 50 MG TABS (TRAMADOL HCL) take one tablet by mouth in AM, one tablet by mouth at lunch and take two tablets by mouth at bedtime as needed pain  #120 x 5   Entered and Authorized by:   Judith Part MD   Signed by:   Lewanda Rife LPN on 24/40/1027   Method used:   Telephoned to ...       [facility]  [phone]* (retail)       [address]       [place], [place]  [address]       Ph: [phone]       Fax: [phone]   RxID:   [id number]

## 2010-02-15 NOTE — Progress Notes (Signed)
Summary: Rx Tramadol  Phone Note Refill Request Call back at [phone] Message from:  [facility] on November 20, 2009 2:13 PM  Refills Requested: Medication #1:  TRAMADOL HCL 50 MG TABS take one tablet by mouth in AM   Last Refilled: 10/17/2009  Method Requested: Electronic Initial call taken by: Emelia Salisbury LPN,  November 21, 9782 2:13 PM  Follow-up for Phone Call        px written on EMR for call in  Follow-up by: Allena Earing MD,  November 20, 2009 2:48 PM  Additional Follow-up for Phone Call Additional follow up Details #1::        Rx called to pharmacy Additional Follow-up by: Emelia Salisbury LPN,  November 21, 7839 3:57 PM    New/Updated Medications: TRAMADOL HCL 50 MG TABS (TRAMADOL HCL) take one tablet by mouth in AM, one tablet by mouth at lunch and take two tablets by mouth at bedtime Prescriptions: TRAMADOL HCL 50 MG TABS (TRAMADOL HCL) take one tablet by mouth in AM, one tablet by mouth at lunch and take two tablets by mouth at bedtime  #120 x 0   Entered and Authorized by:   Allena Earing MD   Signed by:   Emelia Salisbury LPN on 28/20/8138   Method used:   Telephoned to ...       [facility] [phone][facility] (retail)       [address]       [place], [place]  [address]       Ph: [phone]       Fax: [phone]   RxID:   [id number]

## 2010-02-15 NOTE — Assessment & Plan Note (Signed)
Summary: for paper work/alc   Vital Signs:  Patient profile:   50 year old female Height:      65.25 inches Weight:      223.50 pounds BMI:     37.04 Temp:     98.8 degrees F oral Pulse rate:   86 / minute Pulse rhythm:   regular BP sitting:   124 / 82  (left arm) Cuff size:   large  Vitals Entered By: Lewanda Rife LPN (February 27, 2009 11:45 AM)  History of Present Illness: is having bunionectomy R foot medial  is really hurting her esp when she is exercising   Dr Orland Jarred  surg is 2/17 - upcoming  thinks it may be local anesth but not sure   no problems with aneth in past  has had surg in past  several drug allerties to note -- with colitis   no heart probls  stress test 09 nl  no bleeding or clotting problems   bp is well controlled - no problems   has not needed cxr EKG from 09     Allergies: 1)  ! Erythromycin 2)  ! * Ephedrine 3)  ! 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) HYPERCHOLESTEROLEMIA (ICD-272.0) MIGRAINES, HX OF (ICD-V13.8) COLITIS, ULCERATIVE (ICD-556.9) HYPOTHYROIDISM (ICD-244.9) HYPERTENSION (ICD-401.9) GERD (ICD-530.81) DEPRESSION (ICD-311) ASTHMA (ICD-493.90) ANXIETY (ICD-300.00) ALLERGIC RHINITIS (ICD-477.9) FIBROMYALGIA (ICD-729.1) INSOMNIA (ICD-780.52) DYSURIA (ICD-788.1) acne hx of ovarian cysts    GI-- Dr Juanda Chance gyn-- Dr Luella Cook derm---Dr Roseanne Reno psych-- Dr Evelene Croon  endo--Dr Lucianne Muss  rheum- Dr Kellie Simmering  Past Surgical History: Last updated: 08/23/2008 Hospital- wisom teeth removed (8119'J) Tonsillectomy- (1984) Caesarean section- placenta previa, gest DM, pre- eclampsia (1996/1997) Sinus surgery (05/1997) Pre  cancerous mole removed Cholecystectomy, adhesions also (1997) Meckel's diverticulum- surgery (1999) Breast biopsy- on tamoxifen, atypical hyperplasia Dexa- normal (04/1999), dexa nl 2010 Colonoscopy- ulcerative colitis (01/2001) EGD- polyp (09/20023) Exercise stress test- neg (11/2003)   nuclear stress test neg 6/09 Colonoscopy- UC, polyp (04/2004) colonoscopy ulcerative colitis, no polyps 12/08 (11/09) sleep study - no apnea, but did snore (done by [facility])  Family History: Last updated: 05-Jul-2007 father CAD, died of colon cancer, ALZ Mother CAD, HTN, OP great aunts had breast cancer  uncle with CAD and AAA cousin with CVA some remote DM 2   Social History: Last updated: 06/22/2008 Marital Status: Married Children: 2--11-12 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 Eyes:  Denies blurring and eye pain. CV:  Denies chest pain or discomfort and palpitations. Resp:  Denies cough and wheezing. GI:  Denies abdominal pain, bloody stools, change in bowel habits, and indigestion. MS:  Complains of joint pain and stiffness; denies joint redness and joint swelling. Derm:  Denies itching, lesion(s), poor wound healing, and rash. Neuro:  Denies numbness and tingling. Endo:  Denies excessive thirst and excessive urination. Heme:  Denies abnormal bruising and bleeding.  Physical Exam  General:  overweight but generally well appearing  Head:  normocephalic, atraumatic, and no abnormalities observed.   Eyes:  vision grossly intact, pupils equal, pupils round, and  pupils reactive to light.  no conjunctival pallor, injection or icterus  Ears:  R ear normal and L ear normal.   Mouth:  pharynx pink and moist.   Neck:  supple with full rom and no masses or  thyromegally, no JVD or carotid bruit  Lungs:  Normal respiratory effort, chest expands symmetrically. Lungs are clear to auscultation, no crackles or wheezes. Heart:  Normal rate and regular rhythm. S1 and S2 normal without gallop, murmur, click, rub or other extra sounds. Abdomen:  Bowel sounds positive,abdomen soft and non-tender without masses, organomegaly or hernias noted. no renal bruits  Msk:  bunion R med foot- tender Pulses:  R and L carotid,radial,femoral,dorsalis pedis and posterior tibial pulses are full and equal bilaterally Extremities:  trace left pedal edema and trace right pedal edema.   Neurologic:  sensation intact to light touch, gait normal, and DTRs symmetrical and normal.   Skin:  Intact without suspicious lesions or rashes Cervical Nodes:  No lymphadenopathy noted Inguinal Nodes:  No significant adenopathy Psych:  normal affect, talkative and pleasant    Impression & Recommendations:  Problem # 1:  UNSPECIFIED PRE-OPERATIVE EXAMINATION (ICD-V72.84) Assessment New no restrictions for upcoming surgery-- bunion very good control of asthma and HTN  rev med hx and meds and filled out form rev med allergies no aneth problems or bleeding /clotting issues on no blood thinners  Complete Medication List: 1)  Norvasc 5 Mg Tabs (Amlodipine besylate) .Marland Kitchen.. 1 by mouth two times a day 2)  Fluticasone Propionate 50 Mcg/act Susp (Fluticasone propionate) .... 2 sprays in each nostril once daily 3)  Tramadol Hcl 50 Mg Tabs (Tramadol hcl) .Marland Kitchen.. 1-2 by mouth three times a day as needed 4)  Wellbutrin Xl 150 Mg Tb24 (Bupropion hcl) .... 3 by mouth qd 5)  Venlafaxine Hcl 75 Mg Tabs (Venlafaxine hcl) .... Take 1 tablet by mouth twice a day 6)  Trazodone Hcl 150 Mg Tabs (Trazodone hcl) .... One by mouth at bedtime 7)  Abilify 10 Mg Tabs (Aripiprazole) .... Take 1 tablet by mouth once a day 8)  Buspar 15 Mg Tabs (Buspirone hcl) .... Take one tablet by mouth at bedtime, may repeat x 1 as  needed and as needed anxiety 9)  Flexeril 10 Mg Tabs (Cyclobenzaprine hcl) .... 2 by mouth at bedtime 10)  Darvocet A500 100-500 Mg Tabs (Propoxyphene n-apap) .Marland Kitchen.. 1 by mouth q 6 hours as needed pain 11)  Frova 2.5 Mg Tabs (Frovatriptan succinate) .... Take 1-2 as needed for migraines 12)  Chlorpromazine Hcl 25 Mg Tabs (Chlorpromazine hcl) .... Take 1-2 three times a day as needed for migraines if frova doesnt help 13)  Caltrate 600+d 600-400 Mg-unit Tabs (Calcium carbonate-vitamin d) .... Take one by mouth twice a day 14)  Protonix 40 Mg Tbec (Pantoprazole sodium) .... Take 1 tablet by mouth once a day. 15)  Macrodantin  .... As needed with sexual activity as directed 16)  Valtrex 500 Mg Tabs (Valacyclovir hcl) .Marland Kitchen.. 1 tablet by mouth twice daily 17)  Dulcolax 5 Mg Tbec (Bisacodyl) .... 3 at bedtime 18)  Amitriptyline Hcl 50 Mg Tabs (Amitriptyline hcl) .... One tab by mouth at bedtime 19)  Multivitamins Tabs (Multiple vitamin) .... Take 1 tablet by mouth once a day 20)  Phazyme 125 Mg Chew (Simethicone) .... One by mouth three times a day 21)  Spironolactone 25 Mg Tabs (Spironolactone) .Marland Kitchen.. 1 by mouth twice daily 22)  Vitamin D 2000 Units  .... Take by mouth daily 23)  Synthroid 125 Mcg Tabs (Levothyroxine sodium) .... Take two tablets by mouth once daily 24)  Adderall 20 Mg Tabs (Amphetamine-dextroamphetamine) .... Take one tablet three times a day 25)  Simvastatin 40 Mg Tabs (Simvastatin) .... Take 1/2 tab by mouth once daily 26)  Solodyn 65 Mg Xr24h-tab (Minocycline hcl) .... By Galen Manila as needed for acne  Patient Instructions: 1)  no restrictions for upcoming surgery  Current Allergies (reviewed today): ! ERYTHROMYCIN ! * EPHEDRINE ! * COCONUT ASA NSAIDS

## 2010-02-15 NOTE — Procedures (Signed)
Summary: Gastroenterology Colon  Gastroenterology Colon   Imported By: Barb Merino RN 01/26/2007 13:16:37  _____________________________________________________________________  External Attachment:    Type:   Image     Comment:   External Document

## 2010-02-15 NOTE — Assessment & Plan Note (Signed)
Summary: follow-up and lab work   Vital Signs:  Patient profile:   50 year old female Height:      65.25 inches Weight:      212 pounds BMI:     35.14 Temp:     98.3 degrees F oral Pulse rate:   76 / minute Pulse rhythm:   regular BP sitting:   120 / 86  (left arm) Cuff size:   large  Vitals Entered By: Daralene Milch CMA (July 21, 2008 9:22 AM)  History of Present Illness: here for f/u mult issues- labs  doing better overall -- is really sleepy last few days - slept through med last few days   is up on ultram to 2 pills three times per day ? aff liver  ? makes her sleepy also working long hours late at night   vit D level low at 21 started back on high dose weekly tx for 10 weeks-- no troubles with that   ast/alt slt elevated at 41/44  tsh was high- sent to Dr Cammy Copa he did change her thyroid dose- and sees him again next week (did recently miss some doses)   lipids ext high- with trig 276/ HDL 46/ and LDL 227.3-- very high diet was not optimal- was eating eggs 2-3 times per week  now has switched to the whites only  rarely red meat , no fried food both parents have high cholesterol and HTN  was off statin due to money issues - zocor     Allergies: 1)  ! Erythromycin 2)  ! * Ephedrine 3)  ! * Coconut 4)  Asa 5)  Nsaids  Review of Systems General:  Complains of fatigue; denies fever, loss of appetite, and malaise. Eyes:  Denies blurring and discharge. CV:  Denies chest pain or discomfort, lightheadness, palpitations, shortness of breath with exertion, and swelling of feet. Resp:  Denies cough and sputum productive. GI:  Denies abdominal pain, bloody stools, change in bowel habits, indigestion, loss of appetite, and nausea. MS:  Complains of joint pain and stiffness; denies muscle aches and muscle weakness. Derm:  Denies itching, lesion(s), poor wound healing, and rash. Neuro:  Denies numbness. Psych:  is down today- has terminally sick cat she is  worried about. Endo:  Denies cold intolerance and heat intolerance.   Impression & Recommendations:  Problem # 1:  UNSPECIFIED VITAMIN D DEFICIENCY (ICD-268.9) Assessment Deteriorated now on vit D 50.000 international units for 10 weeks no sympt change  disc imp to bone health  re check 6 weeks and adv  Problem # 2:  HYPERCHOLESTEROLEMIA (ICD-272.0) Assessment: Deteriorated  with big inc in chol off the simvastatin re check today back on it  disc low sat fat diet in detail - overall pretty good  labs and adv will watch ast/alt closely also - due to borderline elevation (this may be multifactorial) Her updated medication list for this problem includes:    Simvastatin 20 Mg Tabs (Simvastatin) .Marland Kitchen... 1 by mouth once daily  Labs Reviewed: SGOT: 41 (06/22/2008)   SGPT: 44 (06/22/2008)   HDL:46.40 (06/22/2008), 50.3 (06/22/2007)  LDL:124 (06/22/2007)  Chol:314 (06/22/2008), 191 (06/22/2007)  Trig:276.0 (06/22/2008), 85 (06/22/2007)  Orders: Venipuncture (32951) TLB-Lipid Panel (80061-LIPID) TLB-ALT (SGPT) (84460-ALT) TLB-AST (SGOT) (84450-SGOT)  Problem # 3:  HYPOTHYROIDISM (ICD-244.9) Assessment: Deteriorated  pt has dose adj-- f/u with Dr Dwyane Dee as planned who monitors this  no clinical change except fatigue  Her updated medication list for this problem includes:  Synthroid 112 Mcg Tabs (Levothyroxine sodium) .Marland Kitchen..Marland Kitchen Two tablets by mouth once daily  Labs Reviewed: TSH: 13.58 (06/22/2008)    Chol: 314 (06/22/2008)   HDL: 46.40 (06/22/2008)   LDL: 124 (06/22/2007)   TG: 276.0 (06/22/2008)  Problem # 4:  TRANSAMINASES, SERUM, ELEVATED (ICD-790.4) Assessment: New very mild- likely med related/ multifactorial - also poss fatty liver  will watch this closely did cut tylenol intake -- but had to inc ultram  comment further after lab  Complete Medication List: 1)  Norvasc 5 Mg Tabs (Amlodipine besylate) .Marland Kitchen.. 1 by mouth two times a day 2)  Fluticasone Propionate 50 Mcg/act  Susp (Fluticasone propionate) .... 2 sprays in each nostril once daily 3)  Simvastatin 20 Mg Tabs (Simvastatin) .Marland Kitchen.. 1 by mouth once daily 4)  Tramadol Hcl 50 Mg Tabs (Tramadol hcl) .Marland Kitchen.. 1-2 by mouth three times a day as needed 5)  Synthroid 112 Mcg Tabs (Levothyroxine sodium) .... Two tablets by mouth once daily 6)  Wellbutrin Xl 150 Mg Tb24 (Bupropion hcl) .... 3 by mouth qd 7)  Venlafaxine Hcl 75 Mg Tabs (Venlafaxine hcl) .... Take 1 tablet by mouth once a day 8)  Trazodone Hcl 150 Mg Tabs (Trazodone hcl) .... One by mouth at bedtime 9)  Adderall Xr 30 Mg Xr24h-cap (Amphetamine-dextroamphetamine) .... Take 1 capsule by mouth two times a day 10)  Abilify 10 Mg Tabs (Aripiprazole) .... Take 1 tablet by mouth once a day 11)  Buspar 15 Mg Tabs (Buspirone hcl) .... Take one tablet by mouth at bedtime, may repeat x 1 as needed and as needed anxiety 12)  Flexeril 10 Mg Tabs (Cyclobenzaprine hcl) .... 2 by mouth at bedtime as needed 13)  Potassium Chloride Cr 10 Meq Cpcr (Potassium chloride) .... Take one by mouth once daily (holding 6/10) 14)  Darvocet A500 100-500 Mg Tabs (Propoxyphene n-apap) .Marland Kitchen.. 1 by mouth q 6 hours as needed pain 15)  Frova 2.5 Mg Tabs (Frovatriptan succinate) .... Take 1-2 as needed for migraines 16)  Chlorpromazine Hcl 25 Mg Tabs (Chlorpromazine hcl) .... Take 1-2 three times a day as needed for migraines if frova doesnt help 17)  Caltrate 600+d 600-400 Mg-unit Tabs (Calcium carbonate-vitamin d) .... Take one by mouth twice a day 18)  Protonix 40 Mg Tbec (Pantoprazole sodium) .... Take 1 tablet by mouth once a day. 19)  Tylenol Arthritis Pain 650 Mg Tbcr (Acetaminophen) .... Take 2 by mouth twice a day 20)  Macrodantin  .... As needed with sexual activity as directed 21)  Valtrex 500 Mg Tabs (Valacyclovir hcl) .Marland Kitchen.. 1 tablet by mouth twice daily 22)  Dulcolax 5 Mg Tbec (Bisacodyl) .... 3 at bedtime 23)  Amitriptyline Hcl 50 Mg Tabs (Amitriptyline hcl) .... One tab by mouth at  bedtime 24)  Solodyn 90 Mg Xr24h-tab (Minocycline hcl) .... By mouth as needed acne 25)  Multivitamins Tabs (Multiple vitamin) .... Take 1 tablet by mouth once a day 26)  Phazyme 125 Mg Chew (Simethicone) .... One by mouth three times a day 27)  Spironolactone 25 Mg Tabs (Spironolactone) .Marland Kitchen.. 1 by mouth once daily 28)  Vitamin D (ergocalciferol) 50000 Unit Caps (Ergocalciferol) .... Take once a week for 10 weeks  Patient Instructions: 1)  please check lipid/ast/alt 272 today and send to [facility] 2)  schedule non fasting labs for 6 weeks - vit D level- also send to [facility]  3)  you can raise your HDL (good cholesterol) by increasing exercise and eating omega 3 fatty acid  supplement like fish oil or flax seed oil over the counter 4)  you can lower LDL (bad cholesterol) by limiting saturated fats in diet like red meat, fried foods, egg yolks, fatty breakfast meats, high fat dairy products and shellfish  Prescriptions: VITAMIN D (ERGOCALCIFEROL) 50000 UNIT CAPS (ERGOCALCIFEROL) take once a week for 10 weeks  #4 x 0   Entered and Authorized by:   Allena Earing MD   Signed by:   Allena Earing MD on 07/21/2008   Method used:   Print then Give to Patient   RxID:   [id number]   Prior Medications (reviewed today): NORVASC 5 MG TABS (AMLODIPINE BESYLATE) 1 by mouth two times a day FLUTICASONE PROPIONATE 50 MCG/ACT SUSP (FLUTICASONE PROPIONATE) 2 sprays in each nostril once daily SIMVASTATIN 20 MG TABS (SIMVASTATIN) 1 by mouth once daily TRAMADOL HCL 50 MG TABS (TRAMADOL HCL) 1-2 by mouth three times a day as needed SYNTHROID 112 MCG TABS (LEVOTHYROXINE SODIUM) two tablets by mouth once daily WELLBUTRIN XL 150 MG  TB24 (BUPROPION HCL) 3 by mouth qd VENLAFAXINE HCL 75 MG TABS (VENLAFAXINE HCL) Take 1 tablet by mouth once a day TRAZODONE HCL 150 MG  TABS (TRAZODONE HCL) one by mouth at bedtime ADDERALL XR 30 MG XR24H-CAP (AMPHETAMINE-DEXTROAMPHETAMINE) Take 1 capsule by mouth two times a  day ABILIFY 10 MG  TABS (ARIPIPRAZOLE) Take 1 tablet by mouth once a day BUSPAR 15 MG  TABS (BUSPIRONE HCL) Take one tablet by mouth at bedtime, may repeat x 1 as needed and as needed anxiety FLEXERIL 10 MG  TABS (CYCLOBENZAPRINE HCL) 2 by mouth at bedtime as needed POTASSIUM CHLORIDE CR 10 MEQ  CPCR (POTASSIUM CHLORIDE) take one by mouth once daily (HOLDING 6/10) DARVOCET A500 100-500 MG  TABS (PROPOXYPHENE N-APAP) 1 by mouth q 6 hours as needed pain FROVA 2.5 MG  TABS (FROVATRIPTAN SUCCINATE) take 1-2 as needed for migraines CHLORPROMAZINE HCL 25 MG  TABS (CHLORPROMAZINE HCL) take 1-2 three times a day as needed for migraines if frova doesnt help CALTRATE 600+D 600-400 MG-UNIT  TABS (CALCIUM CARBONATE-VITAMIN D) take one by mouth twice a day PROTONIX 40 MG  TBEC (PANTOPRAZOLE SODIUM) Take 1 tablet by mouth once a day. TYLENOL ARTHRITIS PAIN 650 MG  TBCR (ACETAMINOPHEN) take 2 by mouth twice a day MACRODANTIN () as needed with sexual activity as directed VALTREX 500 MG TABS (VALACYCLOVIR HCL) 1 tablet by mouth twice daily DULCOLAX 5 MG TBEC (BISACODYL) 3 at bedtime AMITRIPTYLINE HCL 50 MG TABS (AMITRIPTYLINE HCL) one tab by mouth at bedtime SOLODYN 90 MG XR24H-TAB (MINOCYCLINE HCL) by mouth as needed acne MULTIVITAMINS   TABS (MULTIPLE VITAMIN) Take 1 tablet by mouth once a day PHAZYME 125 MG CHEW (SIMETHICONE) one by mouth three times a day SPIRONOLACTONE 25 MG TABS (SPIRONOLACTONE) 1 by mouth once daily VITAMIN D (ERGOCALCIFEROL) 50000 UNIT CAPS (ERGOCALCIFEROL) take once a week for 10 weeks Current Allergies (reviewed today): ! ERYTHROMYCIN ! * EPHEDRINE ! * COCONUT ASA NSAIDS Current Medications (including changes made in today's visit):  NORVASC 5 MG TABS (AMLODIPINE BESYLATE) 1 by mouth two times a day FLUTICASONE PROPIONATE 50 MCG/ACT SUSP (FLUTICASONE PROPIONATE) 2 sprays in each nostril once daily SIMVASTATIN 20 MG TABS (SIMVASTATIN) 1 by mouth once daily TRAMADOL HCL 50 MG  TABS (TRAMADOL HCL) 1-2 by mouth three times a day as needed SYNTHROID 112 MCG TABS (LEVOTHYROXINE SODIUM) two tablets by mouth once daily WELLBUTRIN XL 150 MG  TB24 (BUPROPION HCL) 3 by mouth qd  VENLAFAXINE HCL 75 MG TABS (VENLAFAXINE HCL) Take 1 tablet by mouth once a day TRAZODONE HCL 150 MG  TABS (TRAZODONE HCL) one by mouth at bedtime ADDERALL XR 30 MG XR24H-CAP (AMPHETAMINE-DEXTROAMPHETAMINE) Take 1 capsule by mouth two times a day ABILIFY 10 MG  TABS (ARIPIPRAZOLE) Take 1 tablet by mouth once a day BUSPAR 15 MG  TABS (BUSPIRONE HCL) Take one tablet by mouth at bedtime, may repeat x 1 as needed and as needed anxiety FLEXERIL 10 MG  TABS (CYCLOBENZAPRINE HCL) 2 by mouth at bedtime as needed POTASSIUM CHLORIDE CR 10 MEQ  CPCR (POTASSIUM CHLORIDE) take one by mouth once daily (HOLDING 6/10) DARVOCET A500 100-500 MG  TABS (PROPOXYPHENE N-APAP) 1 by mouth q 6 hours as needed pain FROVA 2.5 MG  TABS (FROVATRIPTAN SUCCINATE) take 1-2 as needed for migraines CHLORPROMAZINE HCL 25 MG  TABS (CHLORPROMAZINE HCL) take 1-2 three times a day as needed for migraines if frova doesnt help CALTRATE 600+D 600-400 MG-UNIT  TABS (CALCIUM CARBONATE-VITAMIN D) take one by mouth twice a day PROTONIX 40 MG  TBEC (PANTOPRAZOLE SODIUM) Take 1 tablet by mouth once a day. TYLENOL ARTHRITIS PAIN 650 MG  TBCR (ACETAMINOPHEN) take 2 by mouth twice a day * MACRODANTIN as needed with sexual activity as directed VALTREX 500 MG TABS (VALACYCLOVIR HCL) 1 tablet by mouth twice daily DULCOLAX 5 MG TBEC (BISACODYL) 3 at bedtime AMITRIPTYLINE HCL 50 MG TABS (AMITRIPTYLINE HCL) one tab by mouth at bedtime SOLODYN 90 MG XR24H-TAB (MINOCYCLINE HCL) by mouth as needed acne MULTIVITAMINS   TABS (MULTIPLE VITAMIN) Take 1 tablet by mouth once a day PHAZYME 125 MG CHEW (SIMETHICONE) one by mouth three times a day SPIRONOLACTONE 25 MG TABS (SPIRONOLACTONE) 1 by mouth once daily VITAMIN D (ERGOCALCIFEROL) 50000 UNIT CAPS  (ERGOCALCIFEROL) take once a week for 10 weeks

## 2010-02-15 NOTE — Progress Notes (Signed)
Summary: REfill  Medications Added PROTONIX 40 MG  TBEC (PANTOPRAZOLE SODIUM) Take 1 tablet by mouth once a day.       Phone Note Call from Patient Call back at Home Phone [phone]   Call For: DR Malissa Slay Reason for Call: Talk to Nurse Summary of Call: Pharmacy asked her to call about her refill for Pantoprazole 40mg  takes 1tablet per day. [facility] [place] Initial call taken by: Leanor Kail [facility],  April 18, 2008 11:25 AM  Follow-up for Phone Call        rx sent Left message on patients machine to advise patient. Follow-up by: Harlow Mares CMA,  April 18, 2008 3:58 PM    New/Updated Medications: PROTONIX 40 MG  TBEC (PANTOPRAZOLE SODIUM) Take 1 tablet by mouth once a day.   Prescriptions: PROTONIX 40 MG  TBEC (PANTOPRAZOLE SODIUM) Take 1 tablet by mouth once a day.  #30 x 6   Entered by:   Harlow Mares CMA   Authorized by:   Hart Carwin   Signed by:   Harlow Mares CMA on 04/18/2008   Method used:   [facility]  [address] [phone][facility] ([facility]ail[facility]       [address]       [place], [place]  [address]       Ph: [phone]       Fax: [phone]   RxID:   [id number]

## 2010-02-15 NOTE — Assessment & Plan Note (Signed)
Summary: CPX/CLE   Vital Signs:  Patient Profile:   50 Years Old Female Height:     65.25 inches Weight:      211 pounds Temp:     97.8 degrees F oral Pulse rate:   88 / minute Pulse rhythm:   regular BP sitting:   126 / 90  (left arm) Cuff size:   large  Vitals Entered By: Marty Heck (June 18, 2007 2:17 PM)                 Chief Complaint:  30 minute check up.  History of Present Illness: had a good year- went to [place]- had fun traveling  has been doing pretty well overall gained her wt back last summer-- had lost 30 lb- and gained it is back on wt watchers-- gets 24 points a day   is doing some exercise- walking regularly  some golf   had gyn exam end of april-- all was ok ordered a mammogram-- but did not get a call back no breast lumps  dexa in 01 was ok- hx of prednisone in past   hctz is helping edema  takes macrodantin- prophylactically for intercourse  one episode of exertional cp on her vacatoi when fatigued and stressed- never came back last nl stress test 05 (she is concerned about her heart)  husband got laid off from his job- some stress  sees derm soon for skin ca screen had sunburn- and broke out in rash from product she used for the sunburn       Current Allergies: ! ERYTHROMYCIN ASA NSAIDS  Past Medical History:    Reviewed history from 01/09/2007 and no changes required:       Current Problems:        BENIGN NEOPLASM [place] (ICD-211.9)       HIATAL HERNIA (ICD-553.3)       COLONIC POLYPS, ADENOMATOUS, HX OF (ICD-V12.72)       ADENOCARCINOMA, COLON, FAMILY HX (ICD-V16.0)       EDEMA- LOCALIZED (ICD-782.3)       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)       HYPERCHOLESTEROLEMIA (ICD-272.0)       MIGRAINES, HX OF (ICD-V13.8)       COLITIS, ULCERATIVE (ICD-556.9)       HYPOTHYROIDISM (ICD-244.9)       HYPERTENSION (ICD-401.9)       GERD (ICD-530.81)       DEPRESSION (ICD-311)       ASTHMA (ICD-493.90)  ANXIETY (ICD-300.00)       ALLERGIC RHINITIS (ICD-477.9)       FIBROMYALGIA (ICD-729.1)       INSOMNIA (ICD-780.52)       DYSURIA (ICD-788.1)                     GI-- Dr Olevia Perches       gyn-- Dr Laurey Morale       derm---Dr Nicole Kindred       psych-- Dr Toy Care        endo--Dr Dwyane Dee                 Past Surgical History:    Reviewed history from 01/19/2007 and no changes required:       [place] teeth removed (1980's)       Tonsillectomy- (1984)       Caesarean section- placenta previa, gest DM, pre- eclampsia (1996/1997)       Sinus surgery (05/1997)  Pre cancerous mole removed       Cholecystectomy, adhesions also (1997)       Meckel's diverticulum- surgery (1999)       Breast biopsy- on tamoxifen, atypical hyperplasia       Dexa- normal (04/1999)       Colonoscopy- ulcerative colitis (01/2001)       EGD- polyp (09/20023)       Exercise stress test- neg (11/2003)       Colonoscopy- UC, polyp (04/2004)       colonoscopy ulcerative colitis, no polyps 12/08   Family History:    Reviewed history and no changes required:       father CAD, died of colon cancer, ALZ       Mother CAD, HTN, OP       great aunts had breast cancer        uncle with CAD and AAA       cousin with CVA       some remote DM 2          Social History:    Reviewed history from 01/29/2007 and no changes required:       Marital Status: Married       Children: 2--11-12       Occupation: Buyer, 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  Eyes      Denies blurring.  CV      Complains of chest pain or discomfort and swelling of feet.      Denies lightheadness, palpitations, and shortness of breath with exertion.      just the one episode   Resp      Denies  cough, shortness of breath, and wheezing.  GI      Denies abdominal pain, bloody stools, and change in bowel habits.  GU      Denies discharge and dysuria.  MS      Complains of joint pain.      Denies joint redness and joint swelling.  Derm      Complains of itching and rash.      Denies changes in color of skin.  Neuro      Denies numbness and tingling.  Psych      mood is fairly good    Physical Exam  General:     overweight but generally well appearing  Head:     normocephalic, atraumatic, and no abnormalities observed.   Eyes:     pupils equal, pupils round, and no injection.  no conjunctival pallor, injection or icterus  Ears:     TMs retracted with increased fluid bilat Nose:     no airflow obstruction, mucosal erythema, and mucosal edema.   Mouth:     no exudates and pharyngeal erythema.   Neck:     supple with full rom and no masses or thyromegally, no JVD or carotid bruit  Lungs:     moist harsh cought, no crackles and no wheezes.   Heart:     Normal rate and regular rhythm. S1 and S2 normal without gallop, murmur, click, rub or other  extra sounds. Abdomen:     Bowel sounds positive,abdomen soft and non-tender without masses, organomegaly or hernias noted.  no renal bruits  Msk:     No deformity or scoliosis noted of thoracic or lumbar spine.  no acute joint changes, full rom of all joints  Pulses:     R and L carotid,radial,femoral,dorsalis pedis and posterior tibial pulses are full and equal bilaterally Extremities:     No clubbing, cyanosis, edema, or deformity noted with normal full range of motion of all joints.   Neurologic:     no tremor cranial nerves II-XII intact, sensation intact to light touch, gait normal, and DTRs symmetrical and normal.   Skin:     diffuse sunburn and papular rash over chest/upper back and upper arms Cervical Nodes:     No lymphadenopathy noted Inguinal Nodes:     No significant adenopathy Psych:     normal  affect, talkative and pleasant     Impression & Recommendations:  Problem # 1:  OTHER SCREENING MAMMOGRAM (ICD-V76.12) Assessment: Comment Only will order annual mammogram enc regular self exams  Orders: Radiology Referral (Radiology)   Problem # 2:  [place] (ICD-V70.0) Assessment: Comment Only reviewed health habits including diet, exercise and skin cancer prevention reviewed health maintenance list and family history update pneumovax today enc to stay with wt watchers and get back to exercise for wt loss  Problem # 3:  EDEMA- LOCALIZED (ICD-782.3) Assessment: Improved will continue hctz- is working well check renal labs  Her updated medication list for this problem includes:    Hydrochlorothiazide 25 Mg Tabs (Hydrochlorothiazide) .Marland Kitchen... Take one by mouth once a day   Problem # 4:  CONTACT DERMATITIS (ICD-692.9) Assessment: New some break out on shoulders from sun product- after burn adv to use sunscreen regularly has Derm f/u with Dr Nicole Kindred soon  Problem # 5:  COLONIC POLYPS, ADENOMATOUS, HX OF (ICD-V12.72) Assessment: Comment Only is utd on colonosc for 3 years- reviewed  urged to update if any stool changes or blood in stool colitis has been well controlled and asymptomatic  Problem # 6:  HYPERCHOLESTEROLEMIA (ICD-272.0) Assessment: Unchanged urged to continue low sat fat diet and simvastatin check fasting labs  Her updated medication list for this problem includes:    Simvastatin 20 Mg Tabs (Simvastatin) .Marland Kitchen... 1 by mouth once daily   Problem # 7:  HYPOTHYROIDISM (ICD-244.9) no clinical changes labs and f/u per Dr Dwyane Dee  The following medications were removed from the medication list:    Synthroid 25 Mcg Tabs (Levothyroxine sodium) .Marland Kitchen... Take 1 tablet by mouth once a day  Her updated medication list for this problem includes:    Synthroid 112 Mcg Tabs (Levothyroxine sodium) .Marland Kitchen... 2 by mouth once daily   Problem # 8:  HYPERTENSION  (ICD-401.9) Assessment: Unchanged bp continues to be optimally controlled urged good exercise and wt loss Her updated medication list for this problem includes:    Norvasc 5 Mg Tabs (Amlodipine besylate) .Marland Kitchen... 1 by mouth two times a day    Hydrochlorothiazide 25 Mg Tabs (Hydrochlorothiazide) .Marland Kitchen... Take one by mouth once a day  BP today: 126/90 Prior BP: 120/83 (03/17/2007)  Labs Reviewed: Creat: 0.8 (01/29/2007)   Problem # 9:  CHEST PAIN (ICD-786.50) Assessment: New risk factors of heart dz exertional chest discomfort times one ref for stress cardiolyte Orders: Cardiology Referral (Cardiology)   Complete Medication List: 1)  Norvasc 5 Mg Tabs (Amlodipine besylate) .Marland Kitchen.. 1 by mouth two times a day  2)  Fluticasone Propionate 50 Mcg/act Susp (Fluticasone propionate) 3)  Simvastatin 20 Mg Tabs (Simvastatin) .Marland Kitchen.. 1 by mouth once daily 4)  Mercaptopurine 50 Mg Tabs (Mercaptopurine) 5)  Tramadol Hcl 50 Mg Tabs (Tramadol hcl) .Marland Kitchen.. 1 by mouth three times a day prn 6)  Synthroid 112 Mcg Tabs (Levothyroxine sodium) .... 2 by mouth once daily 7)  Wellbutrin Xl 150 Mg Tb24 (Bupropion hcl) .... 3 by mouth qd 8)  Effexor Xr 75 Mg Cp24 (Venlafaxine hcl) .... Take 3 by mouth qd 9)  Trazodone Hcl 150 Mg Tabs (Trazodone hcl) .... 3 by mouth qhs 10)  Melatonin 3 Mg Caps (Melatonin) .... At bedtime 11)  Adderall 30 Mg Tabs (Amphetamine-dextroamphetamine) .... Take one by mouth every morning 12)  Adderall 10 Mg Tabs (Amphetamine-dextroamphetamine) .Marland Kitchen.. 1- 2 tabs at lunch 13)  Abilify 10 Mg Tabs (Aripiprazole) .... Take 1 tablet by mouth once a day 14)  Buspar 15 Mg Tabs (Buspirone hcl) .... Take 1 tablet by mouth qid 15)  Flexeril 10 Mg Tabs (Cyclobenzaprine hcl) .... 2 by mouth at bedtime as needed 16)  Hydrochlorothiazide 25 Mg Tabs (Hydrochlorothiazide) .... Take one by mouth once a day 17)  Potassium Chloride Cr 10 Meq Cpcr (Potassium chloride) .... Take one by mouth once daily 18)  Darvocet  A500 100-500 Mg Tabs (Propoxyphene n-apap) .Marland Kitchen.. 1 by mouth q 6 hours as needed pain 19)  Frova 2.5 Mg Tabs (Frovatriptan succinate) .... Take 1-2 as needed for migraines 20)  Chlorpromazine Hcl 25 Mg Tabs (Chlorpromazine hcl) .... Take 1-2 as needed for migraines if frova doesnt help 21)  Caltrate 600+d 600-400 Mg-unit Tabs (Calcium carbonate-vitamin d) .... Take one by mouth twice a day 22)  Protonix 40 Mg Tbec (Pantoprazole sodium) .... Take one by mouth daly 23)  Tylenol Arthritis Pain 650 Mg Tbcr (Acetaminophen) .... Take 2 by mouth twice a day 24)  Macrodantin  .... As needed with sexual activity as directed  Other Orders: Pneumococcal Vaccine [phone]) Admin 1st Vaccine [phone])   Patient Instructions: 1)  we will schedule fasting labs at check out lipid / comp met, cbc, v70.0 when able  2)  we will schedule mammogram at check out 3)  we will schedule stress test at check out 4)  work best you can with diet and exercise  5)  the current recommendation for calcium intake is 1200-1500 mg daily with [phone] IU of vitamin D   Prescriptions: DARVOCET A500 100-500 MG  TABS (PROPOXYPHENE N-APAP) 1 by mouth q 6 hours as needed pain  #60 x 1   Entered and Authorized by:   Allena Earing MD   Signed by:   Allena Earing MD on 06/18/2007   Method used:   Print then Give to Patient   RxID:   [id number] POTASSIUM CHLORIDE CR 10 MEQ  CPCR (POTASSIUM CHLORIDE) take one by mouth once daily  #90 x 3   Entered and Authorized by:   Allena Earing MD   Signed by:   Allena Earing MD on 06/18/2007   Method used:   Print then Give to Patient   RxID:   [id number] HYDROCHLOROTHIAZIDE 25 MG  TABS (HYDROCHLOROTHIAZIDE) Take one by mouth once a day  #90 x 3   Entered and Authorized by:   Allena Earing MD   Signed by:   Allena Earing MD on 06/18/2007   Method used:   Print then Give to Patient   RxID:   [id number]  FLEXERIL 10 MG  TABS (CYCLOBENZAPRINE HCL) 2 by mouth at bedtime  as needed  #180 x 3   Entered and Authorized by:   Allena Earing MD   Signed by:   Allena Earing MD on 06/18/2007   Method used:   Print then Give to Patient   RxID:   [id number] SIMVASTATIN 20 MG TABS (SIMVASTATIN) 1 by mouth once daily  #90 x 3   Entered and Authorized by:   Allena Earing MD   Signed by:   Allena Earing MD on 06/18/2007   Method used:   Print then Give to Patient   RxID:   [id number] NORVASC 5 MG TABS (AMLODIPINE BESYLATE) 1 by mouth two times a day  #180 x 3   Entered and Authorized by:   Allena Earing MD   Signed by:   Allena Earing MD on 06/18/2007   Method used:   Print then Give to Patient   RxID:   [id number]  ] Prior Medications (reviewed today): NORVASC 5 MG TABS (AMLODIPINE BESYLATE) 1 by mouth two times a day FLUTICASONE PROPIONATE 50 MCG/ACT SUSP (FLUTICASONE PROPIONATE)  SIMVASTATIN 20 MG TABS (SIMVASTATIN) 1 by mouth once daily MERCAPTOPURINE 50 MG TABS (MERCAPTOPURINE)  TRAMADOL HCL 50 MG TABS (TRAMADOL HCL) 1 by mouth three times a day prn SYNTHROID 112 MCG  TABS (LEVOTHYROXINE SODIUM) 2 by mouth once daily WELLBUTRIN XL 150 MG  TB24 (BUPROPION HCL) 3 by mouth qd EFFEXOR XR 75 MG  CP24 (VENLAFAXINE HCL) Take 3 by mouth qd TRAZODONE HCL 150 MG  TABS (TRAZODONE HCL) 3 by mouth qhs MELATONIN 3 MG  CAPS (MELATONIN) at bedtime ADDERALL 30 MG  TABS (AMPHETAMINE-DEXTROAMPHETAMINE) take one by mouth every morning ADDERALL 10 MG  TABS (AMPHETAMINE-DEXTROAMPHETAMINE) 1- 2 tabs at lunch ABILIFY 10 MG  TABS (ARIPIPRAZOLE) Take 1 tablet by mouth once a day BUSPAR 15 MG  TABS (BUSPIRONE HCL) Take 1 tablet by mouth qid FLEXERIL 10 MG  TABS (CYCLOBENZAPRINE HCL) 2 by mouth at bedtime as needed HYDROCHLOROTHIAZIDE 25 MG  TABS (HYDROCHLOROTHIAZIDE) Take one by mouth once a day POTASSIUM CHLORIDE CR 10 MEQ  CPCR (POTASSIUM CHLORIDE) take one by mouth once daily DARVOCET A500 100-500 MG  TABS (PROPOXYPHENE N-APAP) 1 by mouth q 6 hours as  needed pain FROVA 2.5 MG  TABS (FROVATRIPTAN SUCCINATE) take 1-2 as needed for migraines CHLORPROMAZINE HCL 25 MG  TABS (CHLORPROMAZINE HCL) take 1-2 as needed for migraines if frova doesnt help CALTRATE 600+D 600-400 MG-UNIT  TABS (CALCIUM CARBONATE-VITAMIN D) take one by mouth twice a day PROTONIX 40 MG  TBEC (PANTOPRAZOLE SODIUM) take one by mouth daly TYLENOL ARTHRITIS PAIN 650 MG  TBCR (ACETAMINOPHEN) take 2 by mouth twice a day MACRODANTIN () as needed with sexual activity as directed Current Allergies: ! ERYTHROMYCIN ASA NSAIDS   Preventive Care Screening  Colonoscopy:    Date:  01/14/2007    Next Due:  01/2010    Results:  normal   Last Flu Shot:    Date:  10/15/2006    Results:  given   Bone Density:    Date:  01/15/1999    Results:  normal std dev   Pneumovax Vaccine    Vaccine Type: Pneumovax    Site: left deltoid    Mfr: Merck    Dose: 0.5 ml    Route: IM    Given by: Marty Heck    Exp. Date: 06/22/2008    Lot #: [id number]  VIS given: 08/12/95 version given June 18, 2007.    EKG  Procedure date:  06/18/2007  Findings:      NSR with rate 101 no acute changes

## 2010-02-15 NOTE — Progress Notes (Signed)
Summary: Rx Tramadol  Phone Note Refill Request Call back at [phone] Message [facility] on January 22, 2010 2:07 PM  Refills Requested: Medication #1:  TRAMADOL HCL 50 MG TABS take one tablet by mouth in AM   Last Refilled: 12/22/2009 Initial call taken by: Emelia Salisbury LPN,  January 23, 1476 2:08 PM  Follow-up for Phone Call        px written on EMR for call in  Follow-up by: Allena Earing MD,  January 22, 2010 3:52 PM  Additional Follow-up for Phone Call Additional follow up Details #1::        Medication phoned to [facility] as instructed. Ozzie Hoyle LPN  January 22, 2954 4:52 PM     Prescriptions: TRAMADOL HCL 50 MG TABS (TRAMADOL HCL) take one tablet by mouth in AM, one tablet by mouth at lunch and take two tablets by mouth at bedtime  #120 x 0   Entered and Authorized by:   Allena Earing MD   Signed by:   Ozzie Hoyle LPN on 21/30/8657   Method used:   Telephoned to ...       [phone][facility] (retail)       [address]       [place],   [address]       Ph: [phone]       Fax: [phone]   RxID:   [id number]

## 2010-02-15 NOTE — Progress Notes (Signed)
Summary: mail order rx (in inbox)  Phone Note Refill Request Message from:  Fax from Pharmacy on January 22, 2008 3:33 PM  Refills Requested: Medication #1:  POTASSIUM CHLORIDE CR 10 MEQ  CPCR take one by mouth once daily   Supply Requested: 3 months Initial call taken by: Daralene Milch,  January 22, 2008 3:33 PM  Follow-up for Phone Call        px written on EMR for call in  Follow-up by: Allena Earing MD,  January 22, 2008 5:01 PM  Additional Follow-up for Phone Call Additional follow up Details #1::        Rx faxed to pharmacy Additional Follow-up by: Daralene Milch,  January 25, 2008 9:40 AM    New/Updated Medications: POTASSIUM CHLORIDE CR 10 MEQ  CPCR (POTASSIUM CHLORIDE) take one by mouth once daily   Prescriptions: POTASSIUM CHLORIDE CR 10 MEQ  CPCR (POTASSIUM CHLORIDE) take one by mouth once daily  #3 months x 3   Entered by:   Daralene Milch   Authorized by:   Allena Earing MD   Signed by:   Daralene Milch on 01/25/2008   Method used:   Telephoned to ...       [facility] (mail-order)             ,          Ph: [phone]       Fax: [phone]   RxID:   [id number]

## 2010-02-15 NOTE — Letter (Signed)
Summary: Generic Letter  [place] at [facility]  [address] [place], [place] [address]   Phone: [phone]  Fax: [phone]    07/28/2009  Lindsay Fry [address] [place], [place]  [address]  Dear [name],  Just a reminder we spoke on 07/18/2009 that new guidelines had come out for Zocor or Simvastatin ( is no longer recommended with Amlodipine) so Dr. Milinda Antis needs to change to a different statin or cholesterol medication.   As we discussed Dr Milinda Antis would like for you to check with your insurance company to see which statin cholesterol medication is covered by your insurance company and let Dr. Milinda Antis know so she can switch your medication.  Please give Dr Royden Purl office a call with this information. [phone].         Sincerely,   Lewanda Rife LPN

## 2010-02-15 NOTE — Progress Notes (Signed)
   Phone Note From Other Clinic   Caller: Gina/[facility] Details for Reason: Pt.Information Initial call taken by: Denny Peon    Faxed Stress over to [phone] [facility]  February 27, 2009 10:28 AM

## 2010-02-15 NOTE — Assessment & Plan Note (Signed)
Summary: F/U/CLE   Vital Signs:  Patient Profile:   50 Years Old Female Weight:      210 pounds Temp:     98.3 degrees F oral BP sitting:   114 / 80  (left arm) Cuff size:   large  Vitals Entered By: Emelia Salisbury (January 29, 2007 8:28 AM)                 Chief Complaint:  Followup.  History of Present Illness: has lost a total of 9.8 lb with weight watchers- fluid pill as well swelling is a lot better- no problems no dizziness , is needing to drink more water   is interested in long acting ultram- but too expensive at this time   Current Allergies (reviewed today): ! 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  Resp      Denies cough and shortness of breath.  MS      Complains of joint pain and stiffness.  Derm      Denies rash.  Psych      mood is good lately   Physical Exam  General:     overweight but generally well appearing  Mouth:     MMM Neck:     supple with full rom and no masses or thyromegally, no JVD or carotid bruit  Lungs:     Normal respiratory effort, chest expands symmetrically. Lungs are clear to auscultation, no crackles or wheezes. Heart:     Normal rate and regular rhythm. S1 and S2 normal without gallop, murmur, click, rub or other extra sounds. Extremities:     No clubbing, cyanosis, edema, or deformity noted with normal full range of motion of all joints.   Skin:     Intact without suspicious lesions or rashes Cervical Nodes:     No lymphadenopathy noted Psych:     cheerful affect    Impression & Recommendations:  Problem # 1:  EDEMA- LOCALIZED (ICD-782.3) much imp with diet change, wt loss and HCTZ keep up the good work f/u for PE check renal panel Her updated medication list for this problem includes:    Hydrochlorothiazide 25 Mg Tabs (Hydrochlorothiazide) .Marland Kitchen... Take one by mouth once a day  Orders: Venipuncture ([address]) TLB-Renal Function Panel (80069-RENAL)   Complete Medication List: 1)  Norvasc 5 Mg Tabs (Amlodipine besylate) .Marland Kitchen.. 1 by mouth bid 2)  Fluticasone Propionate 50 Mcg/act Susp (Fluticasone propionate) 3)  Simvastatin 20 Mg Tabs (Simvastatin) 4)  Mercaptopurine 50 Mg Tabs (Mercaptopurine) 5)  Tramadol Hcl 50 Mg Tabs (Tramadol hcl) .Marland Kitchen.. 1 by mouth three times a day prn 6)  Synthroid 112 Mcg Tabs (Levothyroxine sodium) .Marland Kitchen.. 1 by mouth bid 7)  Wellbutrin Xl 150 Mg Tb24 (Bupropion hcl) .... 3 by mouth qd 8)  Prilosec 20 Mg Cpdr (Omeprazole) .... Two times a day 9)  Synthroid 25 Mcg Tabs (Levothyroxine sodium) .... Take 1 tablet by mouth once a day 10)  Effexor Xr 75 Mg Cp24 (Venlafaxine hcl) .... Take 3 by mouth qd 11)  Trazodone Hcl 150 Mg Tabs (Trazodone hcl) .... 3 by mouth qhs 12)  Melatonin 3 Mg Caps (Melatonin) .... At bedtime 13)  Adderall 30 Mg Tabs (  Amphetamine-dextroamphetamine) .... .qdtab 14)  Adderall 10 Mg Tabs (Amphetamine-dextroamphetamine) .Marland Kitchen.. 1- 2 tabs at lunch 15)  Abilify 10 Mg Tabs (Aripiprazole) .... Take 1 tablet by mouth once a day 16)  Buspar 15 Mg Tabs (Buspirone hcl) .... Take 1 tablet by mouth qid 17)  Macrobid 100 Mg Caps (Nitrofurantoin monohyd macro) .Marland Kitchen.. 1 bid 18)  Flexeril 10 Mg Tabs (Cyclobenzaprine hcl) .... 2 qhs 19)  Hydrochlorothiazide 25 Mg Tabs (Hydrochlorothiazide) .... Take one by mouth once a day   Patient Instructions: 1)  we are checking labs today for kidney function 2)  continue HCTZ 3)  follow up for PE as planned    Prescriptions: NORVASC 5 MG TABS (AMLODIPINE BESYLATE) 1 by mouth bid  #60 x 11   Entered and Authorized by:   Allena Earing MD   Signed by:   Allena Earing MD on 01/29/2007   Method used:   Print then Give to Patient   RxID:   [id number] HYDROCHLOROTHIAZIDE 25 MG  TABS (HYDROCHLOROTHIAZIDE)  Take one by mouth once a day  #30 x 11   Entered and Authorized by:   Allena Earing MD   Signed by:   Allena Earing MD on 01/29/2007   Method used:   Print then Give to Patient   RxID:   [id number]  ] Current Allergies (reviewed today): ! ERYTHROMYCIN ASA NSAIDS

## 2010-02-15 NOTE — Letter (Signed)
Summary: Generic Letter  [place] at [facility]  [address] [place], [place] [address]   Phone: [phone]  Fax: [phone]    08/24/2008    Lindsay Fry [address] [place], [place]  [address]    Dear [name],    Your bone density is in normal range, please continue calcium and vitamin D.      Sincerely,   Liane Comber CMA

## 2010-02-15 NOTE — Letter (Signed)
Summary: [facility]  [facility]   Imported By: Laural Benes 04/04/2009 13:31:33  _____________________________________________________________________  External Attachment:    Type:   Image     Comment:   External Document

## 2010-02-15 NOTE — Assessment & Plan Note (Signed)
Summary: ANNUAL F/U.Lindsay KitchenMarland KitchenAS.    History of Present Illness Visit Type: follow up Primary GI MD: [name] Primary [name]: [name], MD Chief Complaint: follow-up visit ulcerative colitis History of Present Illness:   This is a 50 year old white female with ulcerative colitis since 20. She has a positive family history of colon cancer in her father and UC in her mother who has been our patient.. Patient also has gastroesophageal reflux disease. Her last colonoscopy was in April 2006 and it showed no active colitis on biopsies but there was mild dysplasia of the left colon with some atypical cells. She has fibromyalgia. Patient is due for a colonoscopy in December 2011. Patient denies rectal bleeding or diarrhea. She has had chronic constipation for which she takes Dulcolax tablets. Her gastroesophageal reflux is being controlled on Protonix 40 mg daily.   GI Review of Systems      Denies abdominal pain, acid reflux, belching, bloating, chest pain, dysphagia with liquids, dysphagia with solids, heartburn, loss of appetite, nausea, vomiting, vomiting blood, weight loss, and  weight gain.      Reports constipation.     Denies anal fissure, black tarry stools, change in bowel habit, diarrhea, diverticulosis, fecal incontinence, heme positive stool, hemorrhoids, irritable bowel syndrome, jaundice, light color stool, liver problems, rectal bleeding, and  rectal pain.     Updated Prior Medication List: NORVASC 5 MG TABS (AMLODIPINE BESYLATE) 1 by mouth two times a day FLUTICASONE PROPIONATE 50 MCG/ACT SUSP (FLUTICASONE PROPIONATE)  SIMVASTATIN 20 MG TABS (SIMVASTATIN) 1 by mouth once daily MERCAPTOPURINE 50 MG TABS (MERCAPTOPURINE) Take 1 tablet by mouth once a day. MUST HAVE OFFICE VISIT FOR ANY FURTHER REFILLS! TRAMADOL HCL 50 MG TABS (TRAMADOL HCL) 1 by mouth three times a day prn SYNTHROID 112 MCG TABS (LEVOTHYROXINE SODIUM) 1 tablet by mouth once daily WELLBUTRIN XL 150 MG  TB24  (BUPROPION HCL) 3 by mouth qd EFFEXOR XR 75 MG  CP24 (VENLAFAXINE HCL) Take 3 by mouth qd TRAZODONE HCL 150 MG  TABS (TRAZODONE HCL) 3 by mouth qhs MELATONIN 3 MG  CAPS (MELATONIN) at bedtime ADDERALL 30 MG  TABS (AMPHETAMINE-DEXTROAMPHETAMINE) take one by mouth every morning ADDERALL 10 MG  TABS (AMPHETAMINE-DEXTROAMPHETAMINE) 1- 2 tabs at lunch ABILIFY 10 MG  TABS (ARIPIPRAZOLE) Take 1 tablet by mouth once a day BUSPAR 15 MG  TABS (BUSPIRONE HCL) Take 1 tablet by mouth qid FLEXERIL 10 MG  TABS (CYCLOBENZAPRINE HCL) 2 by mouth at bedtime as needed HYDROCHLOROTHIAZIDE 25 MG  TABS (HYDROCHLOROTHIAZIDE) Take one by mouth once a day POTASSIUM CHLORIDE CR 10 MEQ  CPCR (POTASSIUM CHLORIDE) take one by mouth once daily DARVOCET A500 100-500 MG  TABS (PROPOXYPHENE N-APAP) 1 by mouth q 6 hours as needed pain FROVA 2.5 MG  TABS (FROVATRIPTAN SUCCINATE) take 1-2 as needed for migraines CHLORPROMAZINE HCL 25 MG  TABS (CHLORPROMAZINE HCL) take 1-2 as needed for migraines if frova doesnt help CALTRATE 600+D 600-400 MG-UNIT  TABS (CALCIUM CARBONATE-VITAMIN D) take one by mouth twice a day PROTONIX 40 MG  TBEC (PANTOPRAZOLE SODIUM) Take 1 tablet by mouth once a day. MUST HAVE OFFICE VISIT FOR ANY FURTHER REFILLS! TYLENOL ARTHRITIS PAIN 650 MG  TBCR (ACETAMINOPHEN) take 2 by mouth twice a day * MACRODANTIN as needed with sexual activity as directed VALTREX 500 MG TABS (VALACYCLOVIR HCL) 1 tablet by mouth once daily DULCOLAX 5 MG TBEC (BISACODYL) 3 at bedtime  Current Allergies (reviewed today): ! ERYTHROMYCIN ! * EPHEDRINE ASA NSAIDS  Past Medical History:  Reviewed history from 06/18/2007 and no changes required:       Current Problems:        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)       HIATAL HERNIA (ICD-553.3)       COLONIC POLYPS, ADENOMATOUS, HX OF (ICD-V12.72)       ADENOCARCINOMA, COLON, FAMILY HX (ICD-V16.0)       EDEMA- LOCALIZED (ICD-782.3)       ACNE ROSACEA  (ICD-695.3)       HYPERCHOLESTEROLEMIA (ICD-272.0)       MIGRAINES, HX OF (ICD-V13.8)       COLITIS, ULCERATIVE (ICD-556.9)       HYPOTHYROIDISM (ICD-244.9)       HYPERTENSION (ICD-401.9)       GERD (ICD-530.81)       DEPRESSION (ICD-311)       ASTHMA (ICD-493.90)       ANXIETY (ICD-300.00)       ALLERGIC RHINITIS (ICD-477.9)       FIBROMYALGIA (ICD-729.1)       INSOMNIA (ICD-780.52)       DYSURIA (ICD-788.1)                     GI-- Dr Olevia Perches       gyn-- Dr Laurey Morale       derm---Dr Nicole Kindred       psych-- Dr Toy Care        endo--Dr Dwyane Dee                 Past Surgical History:    Reviewed history from 12/07/2007 and no changes required:       Hospital- wisom teeth removed (1980's)       Tonsillectomy- (1984)       Caesarean section- placenta previa, gest DM, pre- eclampsia (1996/1997)       Sinus surgery (05/1997)       Pre cancerous mole removed       Cholecystectomy, adhesions also (1997)       Meckel's diverticulum- surgery (1999)       Breast biopsy- on tamoxifen, atypical hyperplasia       Dexa- normal (04/1999)       Colonoscopy- ulcerative colitis (01/2001)       EGD- polyp (09/20023)       Exercise stress test- neg (11/2003)   nuclear stress test neg 6/09       Colonoscopy- UC, polyp (04/2004)       colonoscopy ulcerative colitis, no polyps 12/08       (11/09) sleep study - no apnea, but did snore (done by [facility])   Family History:    Reviewed history from 06/18/2007 and no changes required:       father CAD, died of colon cancer, ALZ       Mother CAD, HTN, OP       great aunts had breast cancer        uncle with CAD and AAA       cousin with CVA       some remote DM 2          Social History:    Reviewed history from 06/18/2007 and no changes required:       Marital Status: Married       Children: 2--11-12       Occupation: Buyer, 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 All other ROS was otherwise negative.    Vital Signs:  Patient Profile:   50 Years Old Female Height:     65.25 inches Weight:      207.50 pounds BMI:     34.39 BSA:     2.02 Pulse rate:   84 / minute Pulse rhythm:   regular BP sitting:   132 / 88  (right arm)  Vitals Entered By: Randye Lobo CMA (January 20, 2008 11:15 AM)                  Physical Exam  General:     Well developed, well nourished, no acute distress. Neck:     Supple; no masses or thyromegaly. Lungs:     Clear throughout to auscultation. Heart:     Regular rate and rhythm; no murmurs, rubs or bruits. Rectal:     protuberant abdomen soft, nontender with normoactive bowel sounds and normal rectal tone. Stool is soft and hemoccult-negative.    Impression & Recommendations:  Problem # 1:  COLITIS, ULCERATIVE (ICD-556.9) ulcerative colitis of 23 years duration with a history of atypical cells of the left colon seen on last colonoscopy in Dec 2008. She is due for a recall colonoscopy in December 2011. She is currently in remission symptomatically. We will decrease her 6-mercaptopurine from 50 mg to 25 mg a day for the next 2 months and if she remains stable, we will discontinue this medication entirely.  Problem # 2:  HIATAL HERNIA (ICD-553.3) gastroesophageal reflux controlled with Protonix 40 mg a day. Patient will be changing insurance and will let us know if there any changes in her formulary which may effect her PPI  regimen.   Patient Instructions: 1)  reduced 6-MP to 25 mg daily for 2 months then discontinue 2)  recall colonoscopy December 2011 3)  Protonix 40 mg daily 4)  Copy Sent To:Dr M.Tower    ]

## 2010-02-15 NOTE — Assessment & Plan Note (Signed)
Summary: (bean pt) urine specimen for cx/tsc  Nurse Visit      Impression & Recommendations:  Problem # 1:  DYSURIA (ICD-788.1)  Her updated medication list for this problem includes:    Cipro 500 Mg Tabs (Ciprofloxacin hcl) .Marland Kitchen... 1 bid  Orders: T-Culture, Urine ([id number])   Prior Medications: NORVASC 5 MG TABS (AMLODIPINE BESYLATE)  CYCLOBENZAPRINE HCL 10 MG TABS (CYCLOBENZAPRINE HCL)  FLUTICASONE PROPIONATE 50 MCG/ACT SUSP (FLUTICASONE PROPIONATE)  SIMVASTATIN 20 MG TABS (SIMVASTATIN)  MERCAPTOPURINE 50 MG TABS (MERCAPTOPURINE)  TRAMADOL HCL 50 MG TABS (TRAMADOL HCL)  SYNTHROID 200 MCG TABS (LEVOTHYROXINE SODIUM)  EFFEXOR XR   CP24 (VENLAFAXINE HCL CP24), 225 MG () once daily WELLBUTRIN   TABS (BUPROPION HCL TABS), 450 MG () once daily PRILOSEC 20 MG  CPDR (OMEPRAZOLE) two times a day CIPRO 500 MG  TABS (CIPROFLOXACIN HCL) 1 bid Current Allergies: No known allergies  Laboratory Results   Urine Tests  Date/Time Recieved: August 06, 2006 4:20 PM  Date/Time Reported: August 06, 2006 4:21 PM   Routine Urinalysis   Color: yellow Appearance: Clear Glucose: negative   (Normal Range: Negative) Bilirubin: negative   (Normal Range: Negative) Ketone: negative   (Normal Range: Negative) Spec. Gravity: <1.005   (Normal Range: 1.003-1.035) Blood: negative   (Normal Range: Negative) pH: 7.0   (Normal Range: 5.0-8.0) Protein: negative   (Normal Range: Negative) Urobilinogen: negative   (Normal Range: 0-1) Nitrite: negative   (Normal Range: Negative) Leukocyte Esterace: negative   (Normal Range: Negative)    [facility]      Orders Added: 1)  T-Culture, Urine [[id number]]

## 2010-02-15 NOTE — Progress Notes (Signed)
Summary: tramadol refill request  Phone Note From Pharmacy   [facility]   [phone] Call For: dr tower  Summary of Call: refill request for tramadol 50 mg-  electronic Initial call taken by: Marty Heck,  Jun 03, 2007 12:09 PM  Follow-up for Phone Call        px written on EMR for call in  Follow-up by: Allena Earing MD,  Jun 03, 2007 1:59 PM  Additional Follow-up for Phone Call Additional follow up Details #1::        [facility] Additional Follow-up by: Marty Heck,  Jun 03, 2007 2:20 PM      Prescriptions: TRAMADOL HCL 50 MG TABS (TRAMADOL HCL) 1 by mouth three times a day prn  #90 x 3   Entered and Authorized by:   Allena Earing MD   Signed by:   Allena Earing MD on 06/03/2007   Method used:   Telephoned to ...         RxID:   [id number]

## 2010-02-15 NOTE — Miscellaneous (Signed)
Summary: mercaptourine refill  Clinical Lists Changes  Medications: Changed medication from MERCAPTOPURINE 50 MG TABS (MERCAPTOPURINE) to MERCAPTOPURINE 50 MG TABS (MERCAPTOPURINE) Take 1 tablet by mouth once a day - Signed Rx of MERCAPTOPURINE 50 MG TABS (MERCAPTOPURINE) Take 1 tablet by mouth once a day;  #90 x 0;  Signed;  Entered by: Awilda Bill CMA;  Authorized by: Lafayette Dragon MD;  Method used: Printed then faxed to [facility], , , [place]  , Ph: , Fax: [phone]    Prescriptions: MERCAPTOPURINE 50 MG TABS (MERCAPTOPURINE) Take 1 tablet by mouth once a day  #90 x 0   Entered by:   Awilda Bill CMA   Authorized by:   Lafayette Dragon MD   Signed by:   Awilda Bill CMA on 07/10/2007   Method used:   Printed then faxed to ...       [facility]             , [place]         Ph:        Fax: [phone]   RxID:   [id number]   Appended Document: pantoprazole refill    Clinical Lists Changes  Medications: Changed medication from PROTONIX 40 MG  TBEC (PANTOPRAZOLE SODIUM) take one by mouth daly to PROTONIX 40 MG  TBEC (PANTOPRAZOLE SODIUM) Take 1 tablet by mouth once a day - Signed Rx of PROTONIX 40 MG  TBEC (PANTOPRAZOLE SODIUM) Take 1 tablet by mouth once a day;  #90 x 0;  Signed;  Entered by: Awilda Bill CMA;  Authorized by: Lafayette Dragon MD;  Method used: Printed then faxed to [facility], , , [place]  , Ph: , Fax: [phone]    Prescriptions: PROTONIX 40 MG  TBEC (PANTOPRAZOLE SODIUM) Take 1 tablet by mouth once a day  #90 x 0   Entered by:   Awilda Bill CMA   Authorized by:   Lafayette Dragon MD   Signed by:   Awilda Bill CMA on 07/10/2007   Method used:   Printed then faxed to ...       [facility]             , [place]         Ph:        Fax: [phone]   RxID:   [id number]

## 2010-02-15 NOTE — Progress Notes (Signed)
Summary: regarding vitamin D  Phone Note Call from Patient Call back at Work Phone [phone]   Caller: Patient Call For: Allena Earing MD Summary of Call: Pt is asking for vit D results. I dont see anything in the chart.  Should have been drawn at her last office visit.               Marty Heck CMA  August 22, 2009 8:48 AM   Follow-up for Phone Call        Labs were done @ [facility], requested a faxed copy. Gave to [place] to scan.  labs ok incl vit D level of 42 in nl range copy to endo- Dr Dwyane Dee please   Follow-up by: Allena Earing MD,  August 23, 2009 8:56 AM  Additional Follow-up for Phone Call Additional follow up Details #1::        Left message for patient to call back. Lab report faxed to Dr Dwyane Dee as instructed.Ozzie Hoyle LPN  August 24, 9922 10:01 AM   Advised pt.               Marty Heck CMA  August 23, 2009 4:50 PM

## 2010-02-15 NOTE — Progress Notes (Signed)
Summary: Darvocet RX  Phone Note Call from Patient Call back at [facility] Phone [phone]   Caller: Patient Summary of Call: Pt requests an RX refill on her Darvocet. She asks only for 30 tabs because they are on [place] because husband lost ins and they cannot afford more. [facility] [place]. Initial call taken by: Mickle Asper,  May 11, 2007 10:19 AM  Follow-up for Phone Call        px written on EMR for call in was not on med list-- so if this is not the right strength of darvocet please let me know  Follow-up by: Judith Part MD,  May 11, 2007 1:06 PM  Additional Follow-up for Phone Call Additional follow up Details #1::        verified dose with pt, she thinks that is right, called [facility] [place] Additional Follow-up by: Lowella Petties,  May 11, 2007 3:57 PM    New/Updated Medications: DARVOCET A500 100-500 MG  TABS (PROPOXYPHENE N-APAP) 1 by mouth q 6 hours as needed pain   Prescriptions: DARVOCET A500 100-500 MG  TABS (PROPOXYPHENE N-APAP) 1 by mouth q 6 hours as needed pain  #30 x 0   Entered and Authorized by:   Judith Part MD   Signed by:   Lowella Petties on 05/11/2007   Method used:   Telephoned to ...         RxID:   [id number]

## 2010-02-15 NOTE — Progress Notes (Signed)
Summary: wants another tramadol script  Phone Note Call from Patient Call back at Work Phone [phone]   Caller: Patient Summary of Call: Pt states her insurance with [employer] expired on 2/11 and her tramadol script wasnt sent in to them until 2/15.  She said she is not going to get that shipment of tramadol because she will have to pay full price.  She is asking if the same 90 day script can be [facility] [place]. Initial call taken by: Marty Heck,  March 03, 2008 11:01 AM  Follow-up for Phone Call        yes. please do this. Follow-up by: Owens Loffler MD,  March 03, 2008 11:16 AM  Additional Follow-up for Phone Call Additional follow up Details #1::        Medication phoned to pharmacy.  Additional Follow-up by: Christena Deem,  March 03, 2008 11:21 AM

## 2010-02-15 NOTE — Letter (Signed)
Summary: Results Follow up Letter   at [facility]  [address] [place], [place] [address]   Phone: [phone]  Fax: [phone]    07/21/2007 MRN: [id number]  SHRESHTA MEDLEY [address] [place], [place]  [address]  Dear Ms. Bewick,  The following are the results of your recent test(s):  Test         Result    Pap Smear:        Normal _____  Not Normal _____ Comments: ______________________________________________________ Cholesterol: LDL(Bad cholesterol):         Your goal is less than:         HDL (Good cholesterol):       Your goal is more than: Comments:  ______________________________________________________ Mammogram:        Normal  X_  Not Normal _____ Comments: Repeat in one year.  ___________________________________________________________________ Hemoccult:        Normal _____  Not normal _______ Comments:    _____________________________________________________________________ Other Tests:    We routinely do not discuss normal results over the telephone.  If you desire a copy of the results, or you have any questions about this information we can discuss them at your next office visit.   Sincerely,      [name], MD

## 2010-02-15 NOTE — Progress Notes (Signed)
Summary: Tramadol  Phone Note Refill Request Message from:  [place] on December 07, 2008 4:42 PM  Refills Requested: Medication #1:  TRAMADOL HCL 50 MG TABS 1-2 by mouth three times a day as needed  [facility] [address] [phone]*     Pharmacy Phone:  [phone]   Method Requested: Telephone to Pharmacy Initial call taken by: Christena Deem CMA Deborra Medina),  December 07, 2008 4:42 PM  Follow-up for Phone Call        px written on EMR for call in  Follow-up by: Allena Earing MD,  December 09, 2008 5:13 PM  Additional Follow-up for Phone Call Additional follow up Details #1::        Medication phoned to pharmacy.  Additional Follow-up by: Christena Deem CMA ([place]),  December 12, 2008 8:42 AM    New/Updated Medications: TRAMADOL HCL 50 MG TABS (TRAMADOL HCL) 1-2 by mouth three times a day as needed Prescriptions: TRAMADOL HCL 50 MG TABS (TRAMADOL HCL) 1-2 by mouth three times a day as needed  #3 months x 0   Entered and Authorized by:   Allena Earing MD   Signed by:   Christena Deem CMA ([place]) on 12/12/2008   Method used:   Telephoned to ...       [facility] [phone]* (retail)       [address]       [place], [place]  [address]       Ph: [phone]       Fax: [phone]   RxID:   [id number]

## 2010-02-15 NOTE — Letter (Signed)
Summary: [facility]   Imported By: Lanelle Bal 03/01/2009 12:44:00  _____________________________________________________________________  External Attachment:    Type:   Image     Comment:   External Document

## 2010-02-22 ENCOUNTER — Telehealth: Payer: Self-pay | Admitting: Family Medicine

## 2010-03-01 NOTE — Letter (Signed)
Summary: [facility] Office Visit  [facility] Office Visit   Imported By: Kassie Mends 02/21/2010 10:57:28  _____________________________________________________________________  External Attachment:    Type:   Image     Comment:   External Document

## 2010-03-01 NOTE — Progress Notes (Signed)
Summary: refill request for tramadol  Phone Note Refill Request Message from:  Fax from Pharmacy  Refills Requested: Medication #1:  TRAMADOL HCL 50 MG TABS take one tablet by mouth in AM   Last Refilled: 01/22/2010 Faxed request from [facility], [phone].  Initial call taken by: Marty Heck CMA, [place],  February 22, 2010 9:46 AM  Follow-up for Phone Call        px written on EMR for call in  Follow-up by: Allena Earing MD,  February 22, 2010 12:51 PM  Additional Follow-up for Phone Call Additional follow up Details #1::        Medication phoned to [facility] as instructed. Ozzie Hoyle LPN  February 22, 5857 5:01 PM     New/Updated Medications: TRAMADOL HCL 50 MG TABS (TRAMADOL HCL) take one tablet by mouth in AM, one tablet by mouth at lunch and take two tablets by mouth at bedtime Prescriptions: TRAMADOL HCL 50 MG TABS (TRAMADOL HCL) take one tablet by mouth in AM, one tablet by mouth at lunch and take two tablets by mouth at bedtime  #120 x 0   Entered and Authorized by:   Allena Earing MD   Signed by:   Allena Earing MD on 02/22/2010   Method used:   Telephoned to ...       [facility] [phone]* (retail)       [address]       [place], [place]  [address]       Ph: [phone]       Fax: [phone]   RxID:   [id number]

## 2010-03-07 NOTE — Letter (Signed)
[facility]   Imported By: Kassie Mends 02/26/2010 08:47:40  _____________________________________________________________________  External Attachment:    Type:   Image     Comment:   External Document

## 2010-03-26 ENCOUNTER — Telehealth: Payer: Self-pay | Admitting: Family Medicine

## 2010-04-03 NOTE — Progress Notes (Signed)
Summary: refill request for tramadol  Phone Note Refill Request Message from:  Patient  Refills Requested: Medication #1:  TRAMADOL HCL 50 MG TABS take one tablet by mouth in AM [facility] [place].  Pt says pharmacy was to have faxed a refill request but we dont have that yet.  Initial call taken by: Lowella Petties CMA, AAMA,  March 26, 2010 2:54 PM  Follow-up for Phone Call        px written on EMR for call in  Follow-up by: Judith Part MD,  March 26, 2010 3:41 PM  Additional Follow-up for Phone Call Additional follow up Details #1::        Medication phoned to [facility] pharmacy as instructed. Patient notified as instructed by telephone. Lewanda Rife LPN  March 26, 2010 5:12 PM     Prescriptions: TRAMADOL HCL 50 MG TABS (TRAMADOL HCL) take one tablet by mouth in AM, one tablet by mouth at lunch and take two tablets by mouth at bedtime  #120 x 0   Entered and Authorized by:   Judith Part MD   Signed by:   Lewanda Rife LPN on 81/19/1478   Method used:   Telephoned to ...       [facility] [phone]* (retail)       [address]       [place], [place]  [address]       Ph: [phone]       Fax: [phone]   RxID:   [id number]

## 2010-04-25 ENCOUNTER — Other Ambulatory Visit: Payer: Self-pay | Admitting: *Deleted

## 2010-04-25 MED ORDER — TRAMADOL HCL 50 MG PO TABS: ORAL_TABLET | ORAL | Status: DC

## 2010-04-25 NOTE — Telephone Encounter (Signed)
Medication phoned to [facility] as instructed.

## 2010-04-25 NOTE — Telephone Encounter (Signed)
Px written for call in   

## 2010-05-09 ENCOUNTER — Encounter: Payer: Self-pay | Admitting: Family Medicine

## 2010-05-10 ENCOUNTER — Encounter: Payer: Self-pay | Admitting: Family Medicine

## 2010-05-10 ENCOUNTER — Encounter: Payer: Self-pay | Admitting: *Deleted

## 2010-05-10 ENCOUNTER — Ambulatory Visit (INDEPENDENT_AMBULATORY_CARE_PROVIDER_SITE_OTHER): Payer: 59 | Admitting: Family Medicine

## 2010-05-10 VITALS — BP 118/82 | HR 88 | Temp 98.4°F | Ht 67.0 in | Wt 206.0 lb

## 2010-05-10 DIAGNOSIS — J329 Chronic sinusitis, unspecified: Secondary | ICD-10-CM

## 2010-05-10 MED ORDER — AMOXICILLIN-POT CLAVULANATE 875-125 MG PO TABS: 1.0000 | ORAL_TABLET | Freq: Two times a day (BID) | ORAL | Status: AC

## 2010-05-10 NOTE — Assessment & Plan Note (Signed)
Evidently infected, likely bacterial. Treat with 10 d course augmentin, return if worsening. See pt instructions for supportive care recs.

## 2010-05-10 NOTE — Progress Notes (Signed)
  Subjective:    Patient ID: Lindsay Fry, female    DOB: May 28, 1960, 50 y.o.   MRN: [id number]  HPI CC: sinus?  2-3 d h/o congestion, drainage, cough productive of green sputum.  +Subjective fever.  Hoarse voice.  Taking tylenol and afrin nasal spray.  + body aches (h/o fibromyalgia but worse).  + sinus pressure HA.  Son sick over weekend, pt thinks go what he had.  No abd pain, n/v/d, rashes.  No sneezing, runny nose or watery eyes.  No ear pain or tooth pain.    No smokers at home.  No h/o allergies or asthma.    Medications and allergies reviewed and updated as above. PMhx reviewed for relevance.  Review of Systems Per HPI    Objective:   Physical Exam  Vitals reviewed. Constitutional: She appears well-developed and well-nourished. No distress.  HENT:  Head: Normocephalic and atraumatic.  Right Ear: External ear normal.  Left Ear: External ear normal.  Nose: No mucosal edema or rhinorrhea. Right sinus exhibits frontal sinus tenderness. Right sinus exhibits no maxillary sinus tenderness. Left sinus exhibits maxillary sinus tenderness. Left sinus exhibits no frontal sinus tenderness.  Mouth/Throat: Uvula is midline, oropharynx is clear and moist and mucous membranes are normal. No oropharyngeal exudate.       white on tongue  Eyes: Conjunctivae and EOM are normal. Pupils are equal, round, and reactive to light. No scleral icterus.  Neck: Normal range of motion. Neck supple. No JVD present. No thyromegaly present.  Cardiovascular: Normal rate, regular rhythm, normal heart sounds and intact distal pulses.   No murmur heard. Pulmonary/Chest: Effort normal and breath sounds normal. No respiratory distress. She has no wheezes. She has no rales.  Lymphadenopathy:    She has no cervical adenopathy.  Skin: Skin is warm and dry. No rash noted.          Assessment & Plan:

## 2010-05-10 NOTE — Patient Instructions (Signed)
You have a sinus infection. Take medicine as prescribed: augmentin twice daily for 10 days. Push fluids and plenty of rest. Nasal saline irrigation or neti pot to help drain sinuses. May use simple mucinex or guaifenesin immediate release with plenty of fluid to help mobilize mucous. Return if fever >101.5, trouble opening/closing mouth, difficulty swallowing, or worsening.

## 2010-05-21 ENCOUNTER — Other Ambulatory Visit: Payer: Self-pay | Admitting: Internal Medicine

## 2010-05-29 ENCOUNTER — Other Ambulatory Visit: Payer: Self-pay | Admitting: *Deleted

## 2010-05-29 MED ORDER — PANTOPRAZOLE SODIUM 40 MG PO TBEC: 40.0000 mg | DELAYED_RELEASE_TABLET | Freq: Every day | ORAL | Status: DC

## 2010-05-29 MED ORDER — TRAMADOL HCL 50 MG PO TABS: ORAL_TABLET | ORAL | Status: DC

## 2010-05-29 NOTE — Assessment & Plan Note (Signed)
[facility]                         GASTROENTEROLOGY OFFICE NOTE   Lindsay, Fry                   MRN:          [id number]  DATE:12/25/2006                            DOB:          [birth year]    Lindsay Fry is a 50 year old white female with ulcerative colitis  diagnosed in 1986, positive pANCA.  She is currently on 6-mercaptopurine  50 mg a day and doing well.  She also has a positive family history of  colon cancer in her father.  I have been following Lindsay Fry for the past 22  years.  She has had occasional flareups, but most recently she has done  quite well.  The last colonoscopy was done in April 2006 and showed some  dysplasia in the sigmoid colon.  She was due for colonoscopy last  spring, but several events postponed it until now.  She denies any  diarrhea or rectal bleeding.  The concomitant problems are  gastroesophageal reflux disease and fibromyalgia.   MEDICATIONS:  1. Synthroid 0.225 mg daily.  2. Effexor XR 225 mg daily.  3. Trazodone 400 mg nightly.  4. Wellbutrin XL 450 mg daily.  5. Norvasc 5 mg b.i.d.  6. 6-MP 50 mg p.o. daily.  7. Flexeril 20 mg nightly.  8. Flonase 2 sprays b.i.d.  9. Adderall 30 mg in a.m., 20 mg in p.m.  10.BuSpar 15 mg daily.   PHYSICAL EXAMINATION:  Blood pressure 138/88, pulse 80, weight 211  pounds.  Sclerae are anicteric.  LUNGS:  Clear to auscultation.  COR:  Normal S1 and normal S2.  ABDOMEN:  Protuberant, soft and nontender with normoactive bowel sounds.  Liver edge at costal margin.  RECTAL:  Exam not done.  EXTREMITIES:  No edema.   IMPRESSION:  1. 50-year-old white female with ulcerative colitis, in      symptomatic remission.  She is due for colonoscopy.  2. History of dysplasia of the sigmoid colon on colonoscopy.  She      needs followup random biopsies.   PLAN:  1. Continue 6-MP.  2. Substitute Protonix 40 mg daily.  3. Colonoscopy scheduled with routine  colonoscopic prep and random      biopsies.     Lowella Bandy. Olevia Perches, MD  Electronically Signed    DMB/MedQ  DD: 12/25/2006  DT: 12/26/2006  Job #: [id number]   cc:   Marne A. Glori Bickers, MD

## 2010-05-29 NOTE — Telephone Encounter (Signed)
Px written for call in   

## 2010-05-29 NOTE — Telephone Encounter (Signed)
Medication phoned to [facility] as instructed.  

## 2010-05-30 ENCOUNTER — Telehealth: Payer: Self-pay | Admitting: *Deleted

## 2010-05-30 NOTE — Telephone Encounter (Signed)
Prior Berkley Harvey is needed for pantoprazole, form is on your shelf.

## 2010-05-30 NOTE — Telephone Encounter (Signed)
Completed form faxed to [phone] as instructed. Form given to Margarita Grizzle and copy retained at my desk if needed.

## 2010-05-30 NOTE — Telephone Encounter (Signed)
Done and in IN box 

## 2010-06-12 ENCOUNTER — Other Ambulatory Visit: Payer: Self-pay | Admitting: *Deleted

## 2010-06-12 MED ORDER — CYCLOBENZAPRINE HCL 10 MG PO TABS: 20.0000 mg | ORAL_TABLET | Freq: Every day | ORAL | Status: DC

## 2010-06-12 NOTE — Telephone Encounter (Signed)
Px written for call in   

## 2010-06-12 NOTE — Telephone Encounter (Signed)
Rx called in as directed.   

## 2010-07-04 ENCOUNTER — Other Ambulatory Visit: Payer: Self-pay | Admitting: Family Medicine

## 2010-07-04 NOTE — Telephone Encounter (Signed)
Refill request sent electronically from pharmacy. Is it okay to refill this?

## 2010-07-04 NOTE — Telephone Encounter (Signed)
Px sent electronically  

## 2010-08-06 ENCOUNTER — Other Ambulatory Visit: Payer: Self-pay | Admitting: Family Medicine

## 2010-08-06 NOTE — Telephone Encounter (Signed)
[facility] electronically request refill on Tramadol 50mg . Please advise.

## 2010-08-06 NOTE — Telephone Encounter (Signed)
Px sent electronically  

## 2010-10-04 ENCOUNTER — Other Ambulatory Visit: Payer: Self-pay

## 2010-10-04 MED ORDER — ATORVASTATIN CALCIUM 40 MG PO TABS: 20.0000 mg | ORAL_TABLET | Freq: Every day | ORAL | Status: DC

## 2010-10-04 NOTE — Telephone Encounter (Signed)
[facility] refill request Lipitor 40 mg taking 1/2 tab daily # 15 x 1. Pt already scheduled CPX 10/2010.

## 2010-10-09 ENCOUNTER — Other Ambulatory Visit: Payer: Self-pay | Admitting: Family Medicine

## 2010-10-09 NOTE — Telephone Encounter (Signed)
Will refill electronically  

## 2010-10-09 NOTE — Telephone Encounter (Signed)
[facility] electronically request refill for Tramadol 50 mg. Last filled 09/08/10.Please advise.

## 2010-10-14 ENCOUNTER — Telehealth: Payer: Self-pay | Admitting: Family Medicine

## 2010-10-14 DIAGNOSIS — E039 Hypothyroidism, unspecified: Secondary | ICD-10-CM

## 2010-10-14 DIAGNOSIS — E559 Vitamin D deficiency, unspecified: Secondary | ICD-10-CM

## 2010-10-14 DIAGNOSIS — I10 Essential (primary) hypertension: Secondary | ICD-10-CM

## 2010-10-14 DIAGNOSIS — Z0000 Encounter for general adult medical examination without abnormal findings: Secondary | ICD-10-CM | POA: Insufficient documentation

## 2010-10-14 DIAGNOSIS — E7800 Pure hypercholesterolemia, unspecified: Secondary | ICD-10-CM

## 2010-10-14 NOTE — Telephone Encounter (Signed)
Message copied by Abner Greenspan on Sun Oct 14, 2010 12:19 PM ------      Message from: Marchia Bond      Created: Fri Oct 12, 2010  1:19 PM      Regarding: cpx labs mon 10/1       Please order  future cpx labs for pt's upcomming lab appt.      Thanks      Aniceto Boss

## 2010-10-15 ENCOUNTER — Other Ambulatory Visit (INDEPENDENT_AMBULATORY_CARE_PROVIDER_SITE_OTHER): Payer: 59

## 2010-10-15 DIAGNOSIS — I10 Essential (primary) hypertension: Secondary | ICD-10-CM

## 2010-10-15 DIAGNOSIS — E039 Hypothyroidism, unspecified: Secondary | ICD-10-CM

## 2010-10-15 DIAGNOSIS — E7800 Pure hypercholesterolemia, unspecified: Secondary | ICD-10-CM

## 2010-10-15 DIAGNOSIS — E559 Vitamin D deficiency, unspecified: Secondary | ICD-10-CM

## 2010-10-15 DIAGNOSIS — Z0000 Encounter for general adult medical examination without abnormal findings: Secondary | ICD-10-CM

## 2010-10-15 LAB — CBC WITH DIFFERENTIAL/PLATELET
Basophils Absolute: 0.1 10*3/uL (ref 0.0–0.1)
Basophils Relative: 0.7 % (ref 0.0–3.0)
Eosinophils Absolute: 0.2 10*3/uL (ref 0.0–0.7)
Eosinophils Relative: 2.9 % (ref 0.0–5.0)
HCT: 42.5 % (ref 36.0–46.0)
Hemoglobin: 14.3 g/dL (ref 12.0–15.0)
Lymphocytes Relative: 19.1 % (ref 12.0–46.0)
Lymphs Abs: 1.6 10*3/uL (ref 0.7–4.0)
MCHC: 33.5 g/dL (ref 30.0–36.0)
MCV: 91 fl (ref 78.0–100.0)
Monocytes Absolute: 0.6 10*3/uL (ref 0.1–1.0)
Monocytes Relative: 6.5 % (ref 3.0–12.0)
Neutro Abs: 6 10*3/uL (ref 1.4–7.7)
Neutrophils Relative %: 70.8 % (ref 43.0–77.0)
Platelets: 395 10*3/uL (ref 150.0–400.0)
RBC: 4.67 Mil/uL (ref 3.87–5.11)
RDW: 13.1 % (ref 11.5–14.6)
WBC: 8.5 10*3/uL (ref 4.5–10.5)

## 2010-10-15 LAB — COMPREHENSIVE METABOLIC PANEL
ALT: 30 U/L (ref 0–35)
AST: 24 U/L (ref 0–37)
Albumin: 4.1 g/dL (ref 3.5–5.2)
Alkaline Phosphatase: 70 U/L (ref 39–117)
BUN: 16 mg/dL (ref 6–23)
CO2: 27 mEq/L (ref 19–32)
Calcium: 9.2 mg/dL (ref 8.4–10.5)
Chloride: 107 mEq/L (ref 96–112)
Creatinine, Ser: 0.9 mg/dL (ref 0.4–1.2)
GFR: 71.65 mL/min (ref >60.00)
Glucose, Bld: 115 mg/dL — ABNORMAL HIGH (ref 70–99)
Potassium: 4.4 mEq/L (ref 3.5–5.1)
Sodium: 142 mEq/L (ref 135–145)
Total Bilirubin: 0.5 mg/dL (ref 0.3–1.2)
Total Protein: 7.2 g/dL (ref 6.0–8.3)

## 2010-10-15 LAB — TSH: TSH: 0.86 u[IU]/mL (ref 0.35–5.50)

## 2010-10-15 LAB — LIPID PANEL
Cholesterol: 168 mg/dL (ref 0–200)
HDL: 50.7 mg/dL (ref >39.00)
LDL Cholesterol: 96 mg/dL (ref 0–99)
Total CHOL/HDL Ratio: 3
Triglycerides: 107 mg/dL (ref 0.0–149.0)
VLDL: 21.4 mg/dL (ref 0.0–40.0)

## 2010-10-16 LAB — VITAMIN D 25 HYDROXY (VIT D DEFICIENCY, FRACTURES): Vit D, 25-Hydroxy: 56 ng/mL (ref 30–89)

## 2010-10-19 ENCOUNTER — Ambulatory Visit (INDEPENDENT_AMBULATORY_CARE_PROVIDER_SITE_OTHER): Payer: 59 | Admitting: Family Medicine

## 2010-10-19 ENCOUNTER — Encounter: Payer: Self-pay | Admitting: Family Medicine

## 2010-10-19 VITALS — BP 124/82 | HR 100 | Temp 98.4°F | Ht 66.0 in | Wt 219.0 lb

## 2010-10-19 DIAGNOSIS — J40 Bronchitis, not specified as acute or chronic: Secondary | ICD-10-CM | POA: Insufficient documentation

## 2010-10-19 MED ORDER — AMOXICILLIN-POT CLAVULANATE 875-125 MG PO TABS: 1.0000 | ORAL_TABLET | Freq: Two times a day (BID) | ORAL | Status: AC

## 2010-10-19 MED ORDER — GUAIFENESIN-CODEINE 100-10 MG/5ML PO SYRP: 5.0000 mL | ORAL_SOLUTION | Freq: Four times a day (QID) | ORAL | Status: AC | PRN

## 2010-10-19 NOTE — Assessment & Plan Note (Signed)
Acute bronchitis after uri - over 10 days with prod cough and self reported fever  Will cover with augmentin (also had tender sinuses)  Fluids/ sympt care- see inst  Also robitussin ac with caution for cough if needed  Update if worse or not imp

## 2010-10-19 NOTE — Patient Instructions (Signed)
For bronchitis take the Augmentin as directed Drink lots of fluids Try the robitussin with codeine if needed for cough Update me if worse or not improving after a week

## 2010-10-19 NOTE — Progress Notes (Signed)
Subjective:    Patient ID: Lindsay Fry, female    DOB: 06-Oct-1960, 50 y.o.   MRN: [id number]  HPI Here for illness  For past week - has been coughing - and prod of thick brown and green sputum  No shortness of breath or wheezing   Some sniffles - with congestion  Had that 1 week ago and went away  Then this hit her hard   Nl temp 97.6 -- she is 98.4  Is achey and a bit chilled  ? If exp to illness  Is using mucinex which is helpful No other meds otc  Drink lots of fluids   Nausea- and almost vomited from the cough and mucous  No diarrhea  Patient Active Problem List  Diagnoses  . BENIGN NEOPLASM OTH&UNSPEC SITE DIGESTIVE SYSTEM  . HYPOTHYROIDISM  . UNSPECIFIED VITAMIN D DEFICIENCY  . HYPERCHOLESTEROLEMIA  . ANXIETY  . DEPRESSION  . HYPERTENSION  . ALLERGIC RHINITIS  . ASTHMA  . GERD  . HIATAL HERNIA  . COLITIS, ULCERATIVE  . ACNE ROSACEA  . FIBROMYALGIA  . INSOMNIA  . FATIGUE  . COLONIC POLYPS, ADENOMATOUS, HX OF  . MIGRAINES, HX OF  . POSTMENOPAUSAL STATUS  . Sinusitis  . Routine general medical examination at a health care facility  . Bronchitis   Past Medical History  Diagnosis Date  . Rosacea   . Family history of malignant neoplasm of gastrointestinal tract   . Allergic rhinitis, cause unspecified   . Anxiety state, unspecified   . Unspecified asthma   . Benign neoplasm of other and unspecified site of the digestive system   . Ulcerative colitis, unspecified   . Personal history of colonic polyps   . Depressive disorder, not elsewhere classified   . Other malaise and fatigue   . Myalgia and myositis, unspecified   . Esophageal reflux   . Routine general medical examination at a health care facility   . Diaphragmatic hernia without mention of obstruction or gangrene   . Pure hypercholesterolemia   . Unspecified essential hypertension   . Unspecified hypothyroidism   . Insomnia, unspecified   . History of migraines   . Other screening  mammogram   . Asymptomatic postmenopausal status (age-related) (natural)   . Unspecified vitamin D deficiency   . Edema   . Dysuria   . History of ovarian cyst   . Cyst     on Achilles tendon   Past Surgical History  Procedure Date  . Wisdom tooth extraction 1980's  . Tonsillectomy 1984  . Cesarean section 1996/1997    placenta previa, gest DM, pre-eclampsia  . Nasal sinus surgery 05/1997  . Precancerous mole removed   . Cholecystectomy 1997    adhesions also  . Meckel diverticulum excision 1999  . Breast biopsy     on tamoxifen, atypical hyperplasia  . Dexa 04/1999 and 2010    normal  . Colonoscopy 01/2001    Ulcerative colitis  . Esophagogastroduodenoscopy 09/2001    polyp  . Exercise stress test 11/2003    negative  . Nuclear stress test 06/2007    negative  . Colonoscopy 04/2004    UC, polyp  . Colonoscopy 12/08    UC, no polyps  . Sleep study 11/09    no apnea, but did snore (done by [facility])   History  Substance Use Topics  . Smoking status: Former Smoker    Quit date: 01/14/1993  . Smokeless tobacco: Not on file  . Alcohol Use:  Yes     Recovered ETOH   Family History  Problem Relation Age of Onset  . Coronary artery disease Father   . Colon cancer Father   . Alzheimer's disease Father   . Coronary artery disease Mother   . Hypertension Mother   . Osteoporosis Mother   . Breast cancer      great aunts  . Coronary artery disease      Uncle (also AAA)  . Stroke Cousin   . Diabetes      remote family history   Allergies  Allergen Reactions  . Aspirin     REACTION: aggrivates colitis  . Erythromycin     REACTION: GI upset  . Nsaids     REACTION: aggrivate colitis  . Rosuvastatin     REACTION: increased lfts   Current Outpatient Prescriptions on File Prior to Visit  Medication Sig Dispense Refill  . amitriptyline (ELAVIL) 50 MG tablet Take 50 mg by mouth at bedtime.       Marland Kitchen amLODipine (NORVASC) 5 MG tablet Take 5 mg by mouth 2 (two) times  daily.        . ARIPiprazole (ABILIFY) 10 MG tablet Take 10 mg by mouth daily.        Marland Kitchen atorvastatin (LIPITOR) 40 MG tablet Take 0.5 tablets (20 mg total) by mouth daily.  15 tablet  1  . bisacodyl (BISACODYL) 5 MG EC tablet Take 25 mg by mouth at bedtime.       Marland Kitchen buPROPion (WELLBUTRIN XL) 150 MG 24 hr tablet Take 450 mg by mouth daily.        . busPIRone (BUSPAR) 15 MG tablet Take 15 mg by mouth as directed. Take one at bedtime; may repeat once as needed for anxiety       . cyclobenzaprine (FLEXERIL) 10 MG tablet Take 2 tablets (20 mg total) by mouth at bedtime.  60 tablet  5  . fluticasone (FLONASE) 50 MCG/ACT nasal spray 2 sprays by Nasal route 2 (two) times daily.        . frovatriptan (FROVA) 2.5 MG tablet Take 2.5-5 mg by mouth as needed. If recurs, may repeat after 2 hours. Max of 3 tabs in 24 hours.       Marland Kitchen levothyroxine (SYNTHROID, LEVOTHROID) 200 MCG tablet Take 200 mcg by mouth daily.        Marland Kitchen lisdexamfetamine (VYVANSE) 70 MG capsule Take 70 mg by mouth every morning.        . Multiple Vitamin (MULTIVITAMIN) tablet Take 1 tablet by mouth daily.        . pantoprazole (PROTONIX) 40 MG tablet Take 1 tablet (40 mg total) by mouth daily.  30 tablet  6  . simethicone (MYLICON) 125 MG chewable tablet Chew 125 mg by mouth 3 (three) times daily.        Marland Kitchen spironolactone (ALDACTONE) 25 MG tablet Take 25 mg by mouth 2 (two) times daily.        . traMADol (ULTRAM) 50 MG tablet TAKE 1 TABLET EVERY MORNING 1 TABLET AT LUNCH AND 2 TABLETS AT BEDIMTE  120 tablet  5  . traZODone (DESYREL) 150 MG tablet Take 150 mg by mouth at bedtime.        . valACYclovir (VALTREX) 500 MG tablet Take 500 mg by mouth 2 (two) times daily.        Marland Kitchen venlafaxine (EFFEXOR) 75 MG tablet Take 75 mg by mouth 2 (two) times daily.        Marland Kitchen  Calcium-Vitamin D-Vitamin K (VIACTIV) 500-100-40 MG-UNT-MCG CHEW Chew 2 tablets by mouth 2 (two) times daily.        . chlorproMAZINE (THORAZINE) 25 MG tablet Take 25-50 mg by mouth 3 (three)  times daily. For migraines if frova doesn't help.       . diclofenac sodium (VOLTAREN) 1 % GEL Apply 1 application topically 4 (four) times daily.        . nitrofurantoin (MACRODANTIN) 100 MG capsule Take 100 mg by mouth as directed. With sexual activity           Review of Systems Review of Systems  Constitutional: Negative for fever, appetite change, fatigue and unexpected weight change.  Eyes: Negative for pain and visual disturbance.  ENT some nasal congestion and sinus fullness that is improving Respiratory: pos for prod cough/no wheeze or sob  Cardiovascular: Negative for cp or palpitations    Gastrointestinal: Negative for nausea, diarrhea and constipation.  Genitourinary: Negative for urgency and frequency.  Skin: Negative for pallor or rash   Neurological: Negative for weakness, light-headedness, numbness and headaches.  Hematological: Negative for adenopathy. Does not bruise/bleed easily.  Psychiatric/Behavioral: Negative for dysphoric mood. The patient is not nervous/anxious.          Objective:   Physical Exam  Constitutional: She appears well-developed and well-nourished. No distress.       overwt and well appearing   HENT:  Head: Normocephalic and atraumatic.  Right Ear: External ear normal.  Left Ear: External ear normal.  Mouth/Throat: Oropharynx is clear and moist.       bilat maxillary sinus tenderness   Eyes: Conjunctivae and EOM are normal. Pupils are equal, round, and reactive to light. Right eye exhibits no discharge. Left eye exhibits no discharge.  Neck: Normal range of motion. Neck supple. No JVD present. No thyromegaly present.  Cardiovascular: Normal rate, regular rhythm and normal heart sounds.   Pulmonary/Chest: Effort normal and breath sounds normal. No respiratory distress. She has no wheezes. She has no rales.       Harsh bs throughout with junky cough  Rhonchi- scattered  Not sob  Lymphadenopathy:    She has no cervical adenopathy.    Neurological: She is alert. She has normal reflexes.  Skin: Skin is warm and dry. No rash noted. No erythema. No pallor.  Psychiatric: She has a normal mood and affect.          Assessment & Plan:

## 2010-11-01 ENCOUNTER — Other Ambulatory Visit: Payer: Self-pay | Admitting: *Deleted

## 2010-11-01 MED ORDER — AMLODIPINE BESYLATE 5 MG PO TABS: 5.0000 mg | ORAL_TABLET | Freq: Two times a day (BID) | ORAL | Status: DC

## 2010-11-16 ENCOUNTER — Encounter: Payer: 59 | Admitting: Family Medicine

## 2010-11-20 ENCOUNTER — Encounter: Payer: 59 | Admitting: Family Medicine

## 2010-12-05 ENCOUNTER — Encounter: Payer: Self-pay | Admitting: Family Medicine

## 2010-12-05 ENCOUNTER — Ambulatory Visit (INDEPENDENT_AMBULATORY_CARE_PROVIDER_SITE_OTHER): Payer: 59 | Admitting: Family Medicine

## 2010-12-05 VITALS — BP 128/88 | HR 88 | Temp 98.1°F | Ht 66.0 in | Wt 221.0 lb

## 2010-12-05 DIAGNOSIS — I10 Essential (primary) hypertension: Secondary | ICD-10-CM

## 2010-12-05 DIAGNOSIS — E559 Vitamin D deficiency, unspecified: Secondary | ICD-10-CM

## 2010-12-05 DIAGNOSIS — E039 Hypothyroidism, unspecified: Secondary | ICD-10-CM

## 2010-12-05 DIAGNOSIS — Z86010 Personal history of colonic polyps: Secondary | ICD-10-CM

## 2010-12-05 DIAGNOSIS — Z0000 Encounter for general adult medical examination without abnormal findings: Secondary | ICD-10-CM

## 2010-12-05 DIAGNOSIS — E7800 Pure hypercholesterolemia, unspecified: Secondary | ICD-10-CM

## 2010-12-05 DIAGNOSIS — Z1231 Encounter for screening mammogram for malignant neoplasm of breast: Secondary | ICD-10-CM | POA: Insufficient documentation

## 2010-12-05 DIAGNOSIS — Z23 Encounter for immunization: Secondary | ICD-10-CM

## 2010-12-05 MED ORDER — AMLODIPINE BESYLATE 10 MG PO TABS: 10.0000 mg | ORAL_TABLET | Freq: Every day | ORAL | Status: DC

## 2010-12-05 MED ORDER — CYCLOBENZAPRINE HCL 10 MG PO TABS: 20.0000 mg | ORAL_TABLET | Freq: Every day | ORAL | Status: DC

## 2010-12-05 NOTE — Patient Instructions (Signed)
We will schedule mammogram at check out  Flu shot and Tdap today  Follow up with your dermatologist and gyn as planned No change in medicines  Try to get 1200-1500 mg of calcium per day with at least 1000 iu of vitamin D - for bone health

## 2010-12-05 NOTE — Progress Notes (Signed)
Subjective:    Patient ID: Lindsay Fry, female    DOB: [birth year], 50 y.o.   MRN: [id number]  HPI Here for annual health mt exam and to review chronic med problems  Is much better from her bronchitis -- got better pretty quickly   Wants flu shot today  Wt is up 3 more lb with bmi of 35 (up more than that prev) Not eating the right things at times -- towards the end of day - gets off track   HTN in good contol with 128/88 on current meds  vyvanse and dexadrine - may inc it a bit   Glucose 115 -high - impaired fasting glucose  Dr Lucianne Muss is following this  Is trying to stop the sugar -- did well for a month - now getting back on track Weakness is sweets  She is not that motivated to loose weight  Is also on abilify - causing weight gain and also high sugar  Also sluggish  Needs to exercise - no excuse not to -- has breaks to exercise   Lipids in good control with lipitor and diet Diet is overall fair  Lab Results  Component Value Date   CHOL 168 10/15/2010   HDL 50.70 10/15/2010   LDLCALC 96 10/15/2010   LDLDIRECT 227.3 06/22/2008   TRIG 107.0 10/15/2010   CHOLHDL 3 10/15/2010    Vit D def- now level is 56 on current tx   Sees gyn -- last pap oct last year - has a pap yearly  Did [place] last year too - all was fine  Mam 7/11 - needs it  Wants to sched at [facility]  Self exam -no lumps or changes  Flu shot-- today  Td03-- will get Tdap today  colonosc 12/08 with hx of polyps in past - is due this year - will call at end of this mo to set it up for december  Hypothyroid  Lab Results  Component Value Date   TSH 0.86 10/15/2010   saw Dr Lucianne Muss in oct  Stays fatigued No skin or hair changes   Patient Active Problem List  Diagnoses  . BENIGN NEOPLASM OTH&UNSPEC SITE DIGESTIVE SYSTEM  . HYPOTHYROIDISM  . UNSPECIFIED VITAMIN D DEFICIENCY  . HYPERCHOLESTEROLEMIA  . ANXIETY  . DEPRESSION  . HYPERTENSION  . ALLERGIC RHINITIS  . ASTHMA  . GERD  . HIATAL HERNIA  . COLITIS,  ULCERATIVE  . ACNE ROSACEA  . FIBROMYALGIA  . INSOMNIA  . FATIGUE  . COLONIC POLYPS, ADENOMATOUS, HX OF  . MIGRAINES, HX OF  . POSTMENOPAUSAL STATUS  . Sinusitis  . Routine general medical examination at a health care facility  . Bronchitis  . Other screening mammogram   Past Medical History  Diagnosis Date  . Rosacea   . Family history of malignant neoplasm of gastrointestinal tract   . Allergic rhinitis, cause unspecified   . Anxiety state, unspecified   . Unspecified asthma   . Benign neoplasm of other and unspecified site of the digestive system   . Ulcerative colitis, unspecified   . Personal history of colonic polyps   . Depressive disorder, not elsewhere classified   . Other malaise and fatigue   . Myalgia and myositis, unspecified   . Esophageal reflux   . Routine general medical examination at a health care facility   . Diaphragmatic hernia without mention of obstruction or gangrene   . Pure hypercholesterolemia   . Unspecified essential hypertension   . Unspecified hypothyroidism   .  Insomnia, unspecified   . History of migraines   . Other screening mammogram   . Asymptomatic postmenopausal status (age-related) (natural)   . Unspecified vitamin D deficiency   . Edema   . Dysuria   . History of ovarian cyst   . Cyst     on Achilles tendon   Past Surgical History  Procedure Date  . Wisdom tooth extraction 1980's  . Tonsillectomy 1984  . Cesarean section 1996/1997    placenta previa, gest DM, pre-eclampsia  . Nasal sinus surgery 05/1997  . Precancerous mole removed   . Cholecystectomy 1997    adhesions also  . Meckel diverticulum excision 1999  . Breast biopsy     on tamoxifen, atypical hyperplasia  . Dexa 04/1999 and 2010    normal  . Colonoscopy 01/2001    Ulcerative colitis  . Esophagogastroduodenoscopy 09/2001    polyp  . Exercise stress test 11/2003    negative  . Nuclear stress test 06/2007    negative  . Colonoscopy 04/2004    UC, polyp  .  Colonoscopy 12/08    UC, no polyps  . Sleep study 11/09    no apnea, but did snore (done by [facility])   History  Substance Use Topics  . Smoking status: Former Smoker    Quit date: 01/14/1993  . Smokeless tobacco: Not on file  . Alcohol Use: Yes     Recovered ETOH   Family History  Problem Relation Age of Onset  . Coronary artery disease Father   . Colon cancer Father   . Alzheimer's disease Father   . Coronary artery disease Mother   . Hypertension Mother   . Osteoporosis Mother   . Breast cancer      great aunts  . Coronary artery disease      Uncle (also AAA)  . Stroke Cousin   . Diabetes      remote family history   Allergies  Allergen Reactions  . Aspirin     REACTION: aggrivates colitis  . Erythromycin     REACTION: GI upset  . Nsaids     REACTION: aggrivate colitis  . Rosuvastatin     REACTION: increased lfts   Current Outpatient Prescriptions on File Prior to Visit  Medication Sig Dispense Refill  . acetaminophen (TYLENOL ARTHRITIS PAIN) 650 MG CR tablet Take 1,300 mg by mouth every 8 (eight) hours as needed.        Marland Kitchen amitriptyline (ELAVIL) 50 MG tablet Take 50 mg by mouth at bedtime.       . ARIPiprazole (ABILIFY) 10 MG tablet Take 10 mg by mouth daily.        Marland Kitchen atorvastatin (LIPITOR) 40 MG tablet Take 0.5 tablets (20 mg total) by mouth daily.  15 tablet  1  . bisacodyl (BISACODYL) 5 MG EC tablet Take 25 mg by mouth at bedtime.       Marland Kitchen buPROPion (WELLBUTRIN XL) 150 MG 24 hr tablet Take 450 mg by mouth daily.        . busPIRone (BUSPAR) 15 MG tablet Take 15 mg by mouth as directed. Take one at bedtime; may repeat once as needed for anxiety       . Calcium-Vitamin D-Vitamin K (VIACTIV) 500-100-40 MG-UNT-MCG CHEW Chew 2 tablets by mouth 2 (two) times daily.        . chlorproMAZINE (THORAZINE) 25 MG tablet Take 25-50 mg by mouth 3 (three) times daily. For migraines if frova doesn't help.       Marland Kitchen  dextroamphetamine (DEXEDRINE SPANSULE) 15 MG 24 hr capsule Take  15 mg by mouth daily.       . fluticasone (FLONASE) 50 MCG/ACT nasal spray 2 sprays by Nasal route 2 (two) times daily.        Marland Kitchen latanoprost (XALATAN) 0.005 % ophthalmic solution Place 1 drop into both eyes At bedtime.      Marland Kitchen levothyroxine (SYNTHROID, LEVOTHROID) 200 MCG tablet Take 200 mcg by mouth daily.        Marland Kitchen lisdexamfetamine (VYVANSE) 70 MG capsule Take 70 mg by mouth every morning.        . Multiple Vitamin (MULTIVITAMIN) tablet Take 1 tablet by mouth daily.        . nitrofurantoin (MACRODANTIN) 100 MG capsule Take 100 mg by mouth as directed. With sexual activity       . pantoprazole (PROTONIX) 40 MG tablet Take 1 tablet (40 mg total) by mouth daily.  30 tablet  6  . simethicone (MYLICON) 125 MG chewable tablet Chew 125 mg by mouth 3 (three) times daily.        Marland Kitchen spironolactone (ALDACTONE) 25 MG tablet Take 25 mg by mouth 2 (two) times daily.        . traMADol (ULTRAM) 50 MG tablet TAKE 1 TABLET EVERY MORNING 1 TABLET AT LUNCH AND 2 TABLETS AT BEDIMTE  120 tablet  5  . traZODone (DESYREL) 150 MG tablet Take 150 mg by mouth at bedtime.        . valACYclovir (VALTREX) 500 MG tablet Take 500 mg by mouth 2 (two) times daily.        Marland Kitchen venlafaxine (EFFEXOR) 75 MG tablet Take 75 mg by mouth 2 (two) times daily.        Marland Kitchen dextromethorphan-guaiFENesin (MUCINEX DM) 30-600 MG per 12 hr tablet Take 1 tablet by mouth every 12 (twelve) hours.        . diclofenac sodium (VOLTAREN) 1 % GEL Apply 1 application topically 4 (four) times daily.        . frovatriptan (FROVA) 2.5 MG tablet Take 2.5-5 mg by mouth as needed. If recurs, may repeat after 2 hours. Max of 3 tabs in 24 hours.           Review of Systems Review of Systems  Constitutional: Negative for fever, appetite change,  and unexpected weight change. pos for chronic fatigue  Eyes: Negative for pain and visual disturbance.  Respiratory: Negative for cough and shortness of breath.   Cardiovascular: Negative for cp or palpitations      Gastrointestinal: Negative for nausea, diarrhea and constipation.  Genitourinary: Negative for urgency and frequency.  MSK pos for general aches and pains , no joint swelling  Skin: Negative for pallor or rash   Neurological: Negative for weakness, light-headedness, numbness and headaches.  Hematological: Negative for adenopathy. Does not bruise/bleed easily.  Psychiatric/Behavioral: Negative for dysphoric mood. The patient is not nervous/anxious.          Objective:   Physical Exam  Constitutional: She appears well-developed and well-nourished. No distress.       overwt and well appearing   HENT:  Head: Normocephalic and atraumatic.  Right Ear: External ear normal.  Left Ear: External ear normal.  Nose: Nose normal.  Mouth/Throat: Oropharynx is clear and moist.  Eyes: Conjunctivae and EOM are normal. Pupils are equal, round, and reactive to light. No scleral icterus.  Neck: Normal range of motion. Neck supple. No JVD present. Carotid bruit is not present. No thyromegaly  present.  Cardiovascular: Normal rate, regular rhythm, normal heart sounds and intact distal pulses.  Exam reveals no gallop.   Pulmonary/Chest: Effort normal and breath sounds normal. No respiratory distress. She has no wheezes. She exhibits no tenderness.  Abdominal: Soft. Bowel sounds are normal. She exhibits no distension, no abdominal bruit and no mass. There is no tenderness.  Musculoskeletal: Normal range of motion. She exhibits no edema and no tenderness.  Lymphadenopathy:    She has no cervical adenopathy.  Neurological: She is alert. She has normal reflexes. No cranial nerve deficit. She exhibits normal muscle tone. Coordination normal.  Skin: Skin is warm and dry. No rash noted. No erythema. No pallor.       Various brown nevi of different shapes on back  Pt sees derm for these  Psychiatric: She has a normal mood and affect.          Assessment & Plan:

## 2010-12-10 NOTE — Assessment & Plan Note (Signed)
Disc goals for lipids and reasons to control them Rev labs with pt Rev low sat fat diet in detail  Controlled by lipitor

## 2010-12-10 NOTE — Assessment & Plan Note (Signed)
Followed by endocrine tx tsh currently without new clinical changes

## 2010-12-10 NOTE — Assessment & Plan Note (Signed)
Continue to follow - ok on current dose  Also followed by endocrine

## 2010-12-10 NOTE — Assessment & Plan Note (Signed)
bp in fair control at this time  No changes needed  Disc lifstyle change with low sodium diet and exercise   Disc imp of wt loss and exercise

## 2010-12-10 NOTE — Assessment & Plan Note (Signed)
mammo scheduled Will have breast exam at gyn appt

## 2010-12-10 NOTE — Assessment & Plan Note (Signed)
Up to date on colonoscopy and followed by Dr Juanda Chance

## 2010-12-10 NOTE — Assessment & Plan Note (Signed)
Reviewed health habits including diet and exercise and skin cancer prevention Also reviewed health mt list, fam hx and immunizations  Rev wellness labs Pt very unmotivated for lifestyle change admittedly and does not know why

## 2010-12-17 ENCOUNTER — Encounter: Payer: Self-pay | Admitting: Internal Medicine

## 2010-12-26 ENCOUNTER — Telehealth: Payer: Self-pay | Admitting: *Deleted

## 2010-12-26 ENCOUNTER — Other Ambulatory Visit: Payer: Self-pay | Admitting: *Deleted

## 2010-12-26 MED ORDER — PANTOPRAZOLE SODIUM 40 MG PO TBEC: 40.0000 mg | DELAYED_RELEASE_TABLET | Freq: Every day | ORAL | Status: DC

## 2010-12-26 NOTE — Telephone Encounter (Signed)
Dr Juanda Chance- pt is scheduled for [facility] Friday 12/14 for colonoscopy. Procedure report from 2008 colonoscopy says that exam was compromised and pt would need double prep for next colonoscopy.  She did Miralax  Prep for last colonoscopy.  What would you like for her to do for her upcoming procedure?  Pt also has complicated medical history and takes several anti anxiety meds.  She will be rescheduled for propofol colon when she comes for PV. Thanks, Ezra Sites

## 2010-12-26 NOTE — Telephone Encounter (Signed)
Yes, she needs Propofol, Please give Mag citrate1  bottle 2 days before the procedure, then routine Movie prep.

## 2010-12-28 ENCOUNTER — Encounter: Payer: Self-pay | Admitting: Internal Medicine

## 2010-12-28 ENCOUNTER — Ambulatory Visit (AMBULATORY_SURGERY_CENTER): Payer: 59

## 2010-12-28 VITALS — Ht 66.0 in | Wt 221.0 lb

## 2010-12-28 DIAGNOSIS — K5100 Ulcerative (chronic) pancolitis without complications: Secondary | ICD-10-CM

## 2010-12-28 MED ORDER — PEG-KCL-NACL-NASULF-NA ASC-C 100 G PO SOLR: 1.0000 | Freq: Once | ORAL | Status: DC

## 2011-01-04 ENCOUNTER — Encounter: Payer: Self-pay | Admitting: Family Medicine

## 2011-01-04 ENCOUNTER — Ambulatory Visit (INDEPENDENT_AMBULATORY_CARE_PROVIDER_SITE_OTHER): Payer: 59 | Admitting: Family Medicine

## 2011-01-04 VITALS — BP 124/76 | HR 84 | Temp 98.8°F | Wt 224.8 lb

## 2011-01-04 DIAGNOSIS — M545 Low back pain, unspecified: Secondary | ICD-10-CM

## 2011-01-04 MED ORDER — CYCLOBENZAPRINE HCL 10 MG PO TABS: ORAL_TABLET | ORAL | Status: DC

## 2011-01-04 NOTE — Assessment & Plan Note (Signed)
Very MSK pain. Left sided.  Anticipate pulled muscle with spasm given exam.  Seems to be resolving. Treat with increased flexeril for next several days. + FABER and SIJ tenderness to palpation on left - ?mild sacroiliitis.  Provided with stretches from [facility] pt advisor - using pain as guide

## 2011-01-04 NOTE — Patient Instructions (Signed)
I think you have pulled muscle with spasm in the lower left side of the back. Try to increase flexeril as tolerated.  May use limited aleve (1/day) for next 1-2 days Could also be component of sacroiliitis, so provided with stretching exercises. Let us know if not improving as expected.

## 2011-01-04 NOTE — Progress Notes (Signed)
  Subjective:    Patient ID: Lindsay Fry, female    DOB: 02/28/1960, 50 y.o.   MRN: [id number]  HPI CC: back pain  50yo with h/o ulcerative colitis (avoids NSAIDs), fibromyalgia, ADHD presents with 2 d h/o lower back pain.  A few nights ago, after BM stood up from commode, wonders if twisted back because started having left lower back pain.  Starts left lower back, shoots down to below left buttock on back.  5/10 pain.  Sitting better, position changes and standing worse.  Walking is ok.  H/o scoliosis and kyphosis.  Usually tends to get R back pain, not left.  Driving and sitting at desk worsening pain.  Better compared to yesterday (yesterday pain 8/10).  No fevers/chills, other radiculopathy, numbness or tingling, weakness of leg.  No bowel/bladder accidents.  Has tried aleve and tylenol so far.  Also taking tramadol and increased flexeril last night.    Review of Systems Per HPI    Objective:   Physical Exam  Nursing note and vitals reviewed. Constitutional: She appears well-developed and well-nourished. No distress.  HENT:  Head: Normocephalic and atraumatic.  Musculoskeletal:       No midline spine tenderness + L paraspinous mm tightness/spasm Neg SLR bilaterally. No pain with int/ext rotation at hips + FABER on left. + pain with SIJ palpation on L.  No pain at sciatic notch. No pain with GTB palpation  Skin: Skin is warm and dry. No rash noted.       Assessment & Plan:

## 2011-01-10 ENCOUNTER — Encounter: Payer: Self-pay | Admitting: Internal Medicine

## 2011-01-10 ENCOUNTER — Ambulatory Visit (AMBULATORY_SURGERY_CENTER): Payer: 59 | Admitting: Internal Medicine

## 2011-01-10 VITALS — BP 139/84 | HR 84 | Temp 97.9°F | Resp 16 | Ht 66.0 in | Wt 221.0 lb

## 2011-01-10 DIAGNOSIS — D126 Benign neoplasm of colon, unspecified: Secondary | ICD-10-CM

## 2011-01-10 DIAGNOSIS — K5289 Other specified noninfective gastroenteritis and colitis: Secondary | ICD-10-CM

## 2011-01-10 DIAGNOSIS — D4860 Neoplasm of uncertain behavior of unspecified breast: Secondary | ICD-10-CM

## 2011-01-10 DIAGNOSIS — K5100 Ulcerative (chronic) pancolitis without complications: Secondary | ICD-10-CM

## 2011-01-10 HISTORY — DX: Benign neoplasm of colon, unspecified: D12.6

## 2011-01-10 MED ORDER — SODIUM CHLORIDE 0.9 % IV SOLN: 500.0000 mL | INTRAVENOUS | Status: DC

## 2011-01-10 NOTE — Patient Instructions (Signed)
Please review discharge instructions (blue and green sheets)  Await pathology  Resume your normal medications

## 2011-01-10 NOTE — Progress Notes (Signed)
Pt passed large amt of air in the RR.

## 2011-01-10 NOTE — Progress Notes (Signed)
Patient did not experience any of the following events: a burn prior to discharge; a fall within the facility; wrong site/side/patient/procedure/implant event; or a hospital transfer or hospital admission upon discharge from the facility. (G8907) Patient did not have preoperative order for IV antibiotic SSI prophylaxis. (G8918)  

## 2011-01-10 NOTE — Op Note (Signed)
[facility] [address] [place], [place]  [address]  COLONOSCOPY PROCEDURE REPORT  PATIENT:  Lindsay Fry, Lindsay Fry  MR#:  [id number] BIRTHDATE:  24-Nov-1960, 50 yrs. old  GENDER:  female ENDOSCOPIST:  Hedwig Morton. Juanda Chance, MD REF. BY:  Marne A. Milinda Antis, M.D. PROCEDURE DATE:  01/10/2011 PROCEDURE:  Colonoscopy with biopsy ASA CLASS:  Class II INDICATIONS:  evaluation of ulcerative colitis UC since 1988, last colon 2008 MEDICATIONS:   MAC sedation, administered by CRNA, propofol (Diprivan) 350 mg  DESCRIPTION OF PROCEDURE:   After the risks and benefits and of the procedure were explained, informed consent was obtained.  No rectal exam performed. The LB PCF-H180AL [id number] endoscope was introduced through the anus and advanced to the cecum, which was identified by the ileocecal valve.  The quality of the prep was good, using MoviPrep.  The instrument was then slowly withdrawn as the colon was fully examined. <<PROCEDUREIMAGES>>  FINDINGS:  A sessile polyp was found. 5 mm sessile polyp right colon The polyp was removed using cold biopsy forceps (see image1 and image2).  This was otherwise a normal examination of the colon. Random biopsies were obtained and sent to pathology (see image5, image4, and image3). r/o dysplasia   Retroflexed views in the rectum revealed no abnormalities.    The scope was then withdrawn from the patient and the procedure completed.  COMPLICATIONS:  None ENDOSCOPIC IMPRESSION: 1) Sessile polyp 2) Otherwise normal examination no evidence of active colitis RECOMMENDATIONS: 1) Await biopsy results  REPEAT EXAM:  In 5 year(s) for.  ______________________________ Hedwig Morton. Juanda Chance, MD  CC:  n. eSIGNED:   Hedwig Morton. Clista Rainford at 01/10/2011 12:10 PM  Arnoldo Morale, [id number]

## 2011-01-11 ENCOUNTER — Telehealth: Payer: Self-pay | Admitting: *Deleted

## 2011-01-11 ENCOUNTER — Other Ambulatory Visit: Payer: 59 | Admitting: Internal Medicine

## 2011-01-11 NOTE — Telephone Encounter (Signed)

## 2011-01-16 ENCOUNTER — Encounter: Payer: Self-pay | Admitting: Internal Medicine

## 2011-01-21 ENCOUNTER — Ambulatory Visit: Payer: Self-pay | Admitting: Family Medicine

## 2011-01-21 ENCOUNTER — Encounter: Payer: Self-pay | Admitting: Family Medicine

## 2011-01-24 ENCOUNTER — Telehealth: Payer: Self-pay

## 2011-01-24 NOTE — Telephone Encounter (Signed)
Message copied by Helene Shoe on Thu Jan 24, 2011  9:47 AM ------      Message from: [name] A      Created: Mon Jan 21, 2011 10:14 PM       Mammogram is normal       Please note for flow sheet if you can       Due for next screening mammogram in 1 year

## 2011-01-24 NOTE — Telephone Encounter (Signed)
Letter mailed to patient as instructed of normal mammogram. Health maintenance was already updated.

## 2011-02-05 ENCOUNTER — Other Ambulatory Visit: Payer: Self-pay | Admitting: *Deleted

## 2011-02-05 MED ORDER — ATORVASTATIN CALCIUM 40 MG PO TABS: 20.0000 mg | ORAL_TABLET | Freq: Every day | ORAL | Status: DC

## 2011-04-08 ENCOUNTER — Encounter: Payer: Self-pay | Admitting: Family Medicine

## 2011-04-08 ENCOUNTER — Ambulatory Visit (INDEPENDENT_AMBULATORY_CARE_PROVIDER_SITE_OTHER): Payer: 59 | Admitting: Family Medicine

## 2011-04-08 ENCOUNTER — Ambulatory Visit: Payer: 59 | Admitting: Family Medicine

## 2011-04-08 VITALS — BP 110/88 | HR 88 | Temp 98.6°F | Wt 233.0 lb

## 2011-04-08 DIAGNOSIS — J069 Acute upper respiratory infection, unspecified: Secondary | ICD-10-CM | POA: Insufficient documentation

## 2011-04-08 MED ORDER — AMOXICILLIN-POT CLAVULANATE 875-125 MG PO TABS: 1.0000 | ORAL_TABLET | Freq: Two times a day (BID) | ORAL | Status: AC

## 2011-04-08 NOTE — Patient Instructions (Signed)
Take Augmentin as directed.  Drink lots of fluids.  Treat sympotmatically with Mucinex, nasal saline irrigation, and Tylenol/Ibuprofen. You can use warm compresses.   Call if not improving as expected in 5-7 days.

## 2011-04-08 NOTE — Progress Notes (Signed)
SUBJECTIVE:  Lindsay Fry is a 51 y.o. female who complains of coryza, congestion, myalgias, headache and bilateral sinus pain for 10 days. She denies a history of anorexia and chest pain and denies a history of asthma. Patient denies smoke cigarettes.   Patient Active Problem List  Diagnoses  . BENIGN NEOPLASM OTH&UNSPEC SITE DIGESTIVE SYSTEM  . HYPOTHYROIDISM  . UNSPECIFIED VITAMIN D DEFICIENCY  . HYPERCHOLESTEROLEMIA  . ANXIETY  . DEPRESSION  . HYPERTENSION  . ALLERGIC RHINITIS  . ASTHMA  . GERD  . HIATAL HERNIA  . COLITIS, ULCERATIVE  . ACNE ROSACEA  . FIBROMYALGIA  . INSOMNIA  . FATIGUE  . COLONIC POLYPS, ADENOMATOUS, HX OF  . MIGRAINES, HX OF  . POSTMENOPAUSAL STATUS  . Routine general medical examination at a health care facility  . Bronchitis  . Other screening mammogram  . Lower back pain  . URI (upper respiratory infection)   Past Medical History  Diagnosis Date  . Rosacea   . Family history of malignant neoplasm of gastrointestinal tract   . Allergic rhinitis, cause unspecified   . Anxiety state, unspecified   . Unspecified asthma   . Benign neoplasm of other and unspecified site of the digestive system   . Ulcerative colitis, unspecified   . Personal history of colonic polyps   . Depressive disorder, not elsewhere classified   . Other malaise and fatigue   . Myalgia and myositis, unspecified   . Esophageal reflux   . Routine general medical examination at a health care facility   . Diaphragmatic hernia without mention of obstruction or gangrene   . Pure hypercholesterolemia   . Unspecified essential hypertension   . Unspecified hypothyroidism   . Insomnia, unspecified   . History of migraines   . Other screening mammogram   . Asymptomatic postmenopausal status (age-related) (natural)   . Unspecified vitamin D deficiency   . Edema   . Dysuria   . History of ovarian cyst   . Cyst     on Achilles tendon   Past Surgical History  Procedure  Date  . Wisdom tooth extraction 1980's  . Tonsillectomy 1984  . Cesarean section 1996/1997    placenta previa, gest DM, pre-eclampsia  . Nasal sinus surgery 05/1997  . Precancerous mole removed   . Cholecystectomy 1997    adhesions also  . Meckel diverticulum excision 1999  . Breast biopsy     on tamoxifen, atypical hyperplasia  . Dexa 04/1999 and 2010    normal  . Colonoscopy 01/2001    Ulcerative colitis  . Esophagogastroduodenoscopy 09/2001    polyp  . Exercise stress test 11/2003    negative  . Nuclear stress test 06/2007    negative  . Colonoscopy 04/2004    UC, polyp  . Colonoscopy 12/08    UC, no polyps  . Sleep study 11/09    no apnea, but did snore (done by [facility])   History  Substance Use Topics  . Smoking status: Former Smoker    Quit date: 01/14/1993  . Smokeless tobacco: Not on file  . Alcohol Use: No     Recovered ETOH   Family History  Problem Relation Age of Onset  . Coronary artery disease Father   . Colon cancer Father   . Alzheimer's disease Father   . Coronary artery disease Mother   . Hypertension Mother   . Osteoporosis Mother   . Colitis Mother   . Crohn's disease Mother   . Breast  cancer      great aunts  . Coronary artery disease      Uncle (also AAA)  . Diabetes      remote family history  . Stroke Cousin   . Esophageal cancer Neg Hx   . Rectal cancer Neg Hx   . Stomach cancer Neg Hx    Allergies  Allergen Reactions  . Aspirin     REACTION: aggrivates colitis  . Erythromycin     REACTION: GI upset  . Nsaids     REACTION: aggrivate colitis  . Rosuvastatin     REACTION: increased lfts   Current Outpatient Prescriptions on File Prior to Visit  Medication Sig Dispense Refill  . acetaminophen (TYLENOL ARTHRITIS PAIN) 650 MG CR tablet Take 1,300 mg by mouth every 8 (eight) hours as needed.        Marland Kitchen amitriptyline (ELAVIL) 50 MG tablet Take 50 mg by mouth at bedtime.       Marland Kitchen amLODipine (NORVASC) 10 MG tablet Take 1 tablet (10 mg  total) by mouth at bedtime.  90 tablet  3  . ARIPiprazole (ABILIFY) 10 MG tablet Take 10 mg by mouth daily.        Marland Kitchen atorvastatin (LIPITOR) 40 MG tablet Take 0.5 tablets (20 mg total) by mouth daily.  15 tablet  6  . bisacodyl (BISACODYL) 5 MG EC tablet Take 25 mg by mouth at bedtime.       Marland Kitchen buPROPion (WELLBUTRIN XL) 150 MG 24 hr tablet Take 450 mg by mouth daily.        . busPIRone (BUSPAR) 15 MG tablet Take 15 mg by mouth as directed. Take one at bedtime; may repeat once as needed for anxiety       . Calcium-Vitamin D-Vitamin K (VIACTIV) 585-277-82 MG-UNT-MCG CHEW Chew 2 tablets by mouth 2 (two) times daily.        . chlorproMAZINE (THORAZINE) 25 MG tablet Take 25-50 mg by mouth 3 (three) times daily. For migraines if frova doesn't help.       . Clindamycin Phosphate foam       . cyclobenzaprine (FLEXERIL) 10 MG tablet Take 2 tablets (20 mg total) by mouth at bedtime.  180 tablet  3  . dextroamphetamine (DEXEDRINE SPANSULE) 15 MG 24 hr capsule Take 15 mg by mouth daily.       Marland Kitchen dextromethorphan-guaiFENesin (MUCINEX DM) 30-600 MG per 12 hr tablet Take 1 tablet by mouth every 12 (twelve) hours.        . diclofenac sodium (VOLTAREN) 1 % GEL Apply 1 application topically 4 (four) times daily.        . fluticasone (FLONASE) 50 MCG/ACT nasal spray 2 sprays by Nasal route 2 (two) times daily.        . frovatriptan (FROVA) 2.5 MG tablet Take 2.5-5 mg by mouth as needed. If recurs, may repeat after 2 hours. Max of 3 tabs in 24 hours.       Marland Kitchen latanoprost (XALATAN) 0.005 % ophthalmic solution Place 1 drop into both eyes At bedtime.      Marland Kitchen levothyroxine (SYNTHROID, LEVOTHROID) 200 MCG tablet Take 200 mcg by mouth daily.        Marland Kitchen lisdexamfetamine (VYVANSE) 70 MG capsule Take 70 mg by mouth every morning.        . Multiple Vitamin (MULTIVITAMIN) tablet Take 1 tablet by mouth daily.        . nitrofurantoin (MACRODANTIN) 100 MG capsule Take 100 mg by mouth as directed.  With sexual activity       .  pantoprazole (PROTONIX) 40 MG tablet Take 1 tablet (40 mg total) by mouth daily.  30 tablet  6  . simethicone (MYLICON) 858 MG chewable tablet Chew 125 mg by mouth 3 (three) times daily.        Marland Kitchen spironolactone (ALDACTONE) 25 MG tablet Take 25 mg by mouth 2 (two) times daily.        . traMADol (ULTRAM) 50 MG tablet TAKE 1 TABLET EVERY MORNING 1 TABLET AT LUNCH AND 2 TABLETS AT BEDIMTE  120 tablet  5  . traZODone (DESYREL) 150 MG tablet Take 150 mg by mouth at bedtime.        . valACYclovir (VALTREX) 500 MG tablet Take 500 mg by mouth 2 (two) times daily.        Marland Kitchen venlafaxine (EFFEXOR) 75 MG tablet Take 75 mg by mouth 2 (two) times daily.         The PMH, PSH, Social History, Family History, Medications, and allergies have been reviewed in [facility], and have been updated if relevant.  OBJECTIVE: BP 110/88  Pulse 88  Temp(Src) 98.6 F (37 C) (Oral)  Wt 233 lb (105.688 kg)  She appears well, vital signs are as noted. Ears normal.  Throat and pharynx normal.  Neck supple. No adenopathy in the neck. Nose is congested. Sinuses non tender. The chest is clear, without wheezes or rales.  ASSESSMENT:  sinusitis  PLAN: Given duration and progression of symptoms, will treat for bacterial sinusitis with Augmentin. Symptomatic therapy suggested: push fluids, rest and return office visit prn if symptoms persist or worsen.  Call or return to clinic prn if these symptoms worsen or fail to improve as anticipated.

## 2011-04-21 ENCOUNTER — Other Ambulatory Visit: Payer: Self-pay | Admitting: Family Medicine

## 2011-04-22 NOTE — Telephone Encounter (Signed)
Will refill electronically  

## 2011-04-23 ENCOUNTER — Ambulatory Visit (INDEPENDENT_AMBULATORY_CARE_PROVIDER_SITE_OTHER): Payer: 59 | Admitting: Family Medicine

## 2011-04-23 ENCOUNTER — Encounter: Payer: Self-pay | Admitting: Family Medicine

## 2011-04-23 VITALS — BP 130/82 | HR 83 | Temp 97.7°F | Ht 66.0 in | Wt 233.5 lb

## 2011-04-23 DIAGNOSIS — IMO0001 Reserved for inherently not codable concepts without codable children: Secondary | ICD-10-CM

## 2011-04-23 DIAGNOSIS — R5383 Other fatigue: Secondary | ICD-10-CM

## 2011-04-23 DIAGNOSIS — F3289 Other specified depressive episodes: Secondary | ICD-10-CM

## 2011-04-23 DIAGNOSIS — R5381 Other malaise: Secondary | ICD-10-CM

## 2011-04-23 DIAGNOSIS — F329 Major depressive disorder, single episode, unspecified: Secondary | ICD-10-CM

## 2011-04-23 DIAGNOSIS — E559 Vitamin D deficiency, unspecified: Secondary | ICD-10-CM

## 2011-04-23 LAB — CBC WITH DIFFERENTIAL/PLATELET
Basophils Absolute: 0 10*3/uL (ref 0.0–0.1)
Basophils Relative: 0.5 % (ref 0.0–3.0)
Eosinophils Absolute: 0.2 10*3/uL (ref 0.0–0.7)
Eosinophils Relative: 2.4 % (ref 0.0–5.0)
HCT: 43 % (ref 36.0–46.0)
Hemoglobin: 14.4 g/dL (ref 12.0–15.0)
Lymphocytes Relative: 20.1 % (ref 12.0–46.0)
Lymphs Abs: 1.8 10*3/uL (ref 0.7–4.0)
MCHC: 33.4 g/dL (ref 30.0–36.0)
MCV: 90.9 fl (ref 78.0–100.0)
Monocytes Absolute: 0.8 10*3/uL (ref 0.1–1.0)
Monocytes Relative: 8.2 % (ref 3.0–12.0)
Neutro Abs: 6.3 10*3/uL (ref 1.4–7.7)
Neutrophils Relative %: 68.8 % (ref 43.0–77.0)
Platelets: 368 10*3/uL (ref 150.0–400.0)
RBC: 4.73 Mil/uL (ref 3.87–5.11)
RDW: 14 % (ref 11.5–14.6)
WBC: 9.2 10*3/uL (ref 4.5–10.5)

## 2011-04-23 LAB — VITAMIN B12: Vitamin B-12: 687 pg/mL (ref 211–911)

## 2011-04-23 LAB — COMPREHENSIVE METABOLIC PANEL
ALT: 39 U/L — ABNORMAL HIGH (ref 0–35)
AST: 31 U/L (ref 0–37)
Albumin: 4.4 g/dL (ref 3.5–5.2)
Alkaline Phosphatase: 71 U/L (ref 39–117)
BUN: 16 mg/dL (ref 6–23)
CO2: 27 mEq/L (ref 19–32)
Calcium: 9.5 mg/dL (ref 8.4–10.5)
Chloride: 105 mEq/L (ref 96–112)
Creatinine, Ser: 0.9 mg/dL (ref 0.4–1.2)
GFR: 73.43 mL/min (ref >60.00)
Glucose, Bld: 95 mg/dL (ref 70–99)
Potassium: 4 mEq/L (ref 3.5–5.1)
Sodium: 143 mEq/L (ref 135–145)
Total Bilirubin: 0.4 mg/dL (ref 0.3–1.2)
Total Protein: 7.5 g/dL (ref 6.0–8.3)

## 2011-04-23 LAB — TSH: TSH: 1.64 u[IU]/mL (ref 0.35–5.50)

## 2011-04-23 NOTE — Patient Instructions (Signed)
Labs today Ref to rheum and also psychology at check out  Try to stay as active as you possibly can  Off work until Monday

## 2011-04-23 NOTE — Progress Notes (Signed)
Subjective:    Patient ID: Lindsay Fry, female    DOB: Sep 30, 1960, 51 y.o.   MRN: [id number]  HPI Here not feeling well in general Has severe fatigue -- ? Know why  When she comes home - goes right to bed until time to get up   (is sleeping well)  Before 2-3 pm- feels sedated again   Dr Wylene Simmer- inc her dexadrine -- and that did not help  Does not feel like her depression is worse  No other change in medicines   Has been checked out for sleep apnea - and does not have it    Thinks her fibromyalgia is getting worse  Thinks she needs to see a new rheumatologist  Has had migraine in past 2 weeks - had been a while Is missing work more often  Stress- learning a new computer system now , worried about upcomig eval Dreads going to work - feels like she is really behind  [place] she needs some time off of work  Is not seeing a Social worker -- saw one years ago  Sees psychiatry in [place]   Lab Results  Component Value Date   TSH 0.86 10/15/2010   Lab Results  Component Value Date   WBC 8.5 10/15/2010   HGB 14.3 10/15/2010   HCT 42.5 10/15/2010   MCV 91.0 10/15/2010   PLT 395.0 10/15/2010      Tramadol Flexeril Tylenol  Limited help   Patient Active Problem List  Diagnoses  . BENIGN NEOPLASM OTH&UNSPEC SITE DIGESTIVE SYSTEM  . HYPOTHYROIDISM  . UNSPECIFIED VITAMIN D DEFICIENCY  . HYPERCHOLESTEROLEMIA  . ANXIETY  . DEPRESSION  . HYPERTENSION  . ALLERGIC RHINITIS  . ASTHMA  . GERD  . HIATAL HERNIA  . COLITIS, ULCERATIVE  . ACNE ROSACEA  . FIBROMYALGIA  . INSOMNIA  . FATIGUE  . COLONIC POLYPS, ADENOMATOUS, HX OF  . MIGRAINES, HX OF  . POSTMENOPAUSAL STATUS  . Routine general medical examination at a health care facility  . Bronchitis  . Other screening mammogram  . Lower back pain  . URI (upper respiratory infection)  . Fatigue   Past Medical History  Diagnosis Date  . Rosacea   . Family history of malignant neoplasm of gastrointestinal tract   . Allergic  rhinitis, cause unspecified   . Anxiety state, unspecified   . Unspecified asthma   . Benign neoplasm of other and unspecified site of the digestive system   . Ulcerative colitis, unspecified   . Personal history of colonic polyps   . Depressive disorder, not elsewhere classified   . Other malaise and fatigue   . Myalgia and myositis, unspecified   . Esophageal reflux   . Routine general medical examination at a health care facility   . Diaphragmatic hernia without mention of obstruction or gangrene   . Pure hypercholesterolemia   . Unspecified essential hypertension   . Unspecified hypothyroidism   . Insomnia, unspecified   . History of migraines   . Other screening mammogram   . Asymptomatic postmenopausal status (age-related) (natural)   . Unspecified vitamin D deficiency   . Edema   . Dysuria   . History of ovarian cyst   . Cyst     on Achilles tendon   Past Surgical History  Procedure Date  . Wisdom tooth extraction 1980's  . Tonsillectomy 1984  . Cesarean section 1996/1997    placenta previa, gest DM, pre-eclampsia  . Nasal sinus surgery 05/1997  . Precancerous mole removed   .  Cholecystectomy 1997    adhesions also  . Meckel diverticulum excision 1999  . Breast biopsy     on tamoxifen, atypical hyperplasia  . Dexa 04/1999 and 2010    normal  . Colonoscopy 01/2001    Ulcerative colitis  . Esophagogastroduodenoscopy 09/2001    polyp  . Exercise stress test 11/2003    negative  . Nuclear stress test 06/2007    negative  . Colonoscopy 04/2004    UC, polyp  . Colonoscopy 12/08    UC, no polyps  . Sleep study 11/09    no apnea, but did snore (done by [facility])   History  Substance Use Topics  . Smoking status: Former Smoker    Quit date: 01/14/1993  . Smokeless tobacco: Not on file  . Alcohol Use: No     Recovered ETOH   Family History  Problem Relation Age of Onset  . Coronary artery disease Father   . Colon cancer Father   . Alzheimer's disease Father    . Coronary artery disease Mother   . Hypertension Mother   . Osteoporosis Mother   . Colitis Mother   . Crohn's disease Mother   . Breast cancer      great aunts  . Coronary artery disease      Uncle (also AAA)  . Diabetes      remote family history  . Stroke Cousin   . Esophageal cancer Neg Hx   . Rectal cancer Neg Hx   . Stomach cancer Neg Hx    Allergies  Allergen Reactions  . Aspirin     REACTION: aggrivates colitis  . Erythromycin     REACTION: GI upset  . Nsaids     REACTION: aggrivate colitis  . Rosuvastatin     REACTION: increased lfts   Current Outpatient Prescriptions on File Prior to Visit  Medication Sig Dispense Refill  . acetaminophen (TYLENOL ARTHRITIS PAIN) 650 MG CR tablet Take 1,300 mg by mouth every 8 (eight) hours as needed.        Marland Kitchen amitriptyline (ELAVIL) 50 MG tablet Take 50 mg by mouth at bedtime.       Marland Kitchen amLODipine (NORVASC) 10 MG tablet Take 1 tablet (10 mg total) by mouth at bedtime.  90 tablet  3  . ARIPiprazole (ABILIFY) 10 MG tablet Take 10 mg by mouth daily.        Marland Kitchen atorvastatin (LIPITOR) 40 MG tablet Take 0.5 tablets (20 mg total) by mouth daily.  15 tablet  6  . bisacodyl (BISACODYL) 5 MG EC tablet Take 25 mg by mouth at bedtime.       Marland Kitchen buPROPion (WELLBUTRIN XL) 150 MG 24 hr tablet Take 450 mg by mouth daily.        . busPIRone (BUSPAR) 15 MG tablet Take 15 mg by mouth as directed. Take one at bedtime; may repeat once as needed for anxiety       . Calcium-Vitamin D-Vitamin K (VIACTIV) 914-782-95 MG-UNT-MCG CHEW Chew 2 tablets by mouth 2 (two) times daily.        . chlorproMAZINE (THORAZINE) 25 MG tablet Take 25-50 mg by mouth 3 (three) times daily. For migraines if frova doesn't help.       . Clindamycin Phosphate foam       . cyclobenzaprine (FLEXERIL) 10 MG tablet Take 2 tablets (20 mg total) by mouth at bedtime.  180 tablet  3  . dextroamphetamine (DEXEDRINE SPANSULE) 15 MG 24 hr capsule Take 15  mg by mouth daily.       . diclofenac  sodium (VOLTAREN) 1 % GEL Apply 1 application topically 4 (four) times daily.        . fluticasone (FLONASE) 50 MCG/ACT nasal spray 2 sprays by Nasal route 2 (two) times daily.        Marland Kitchen latanoprost (XALATAN) 0.005 % ophthalmic solution Place 1 drop into both eyes At bedtime.      Marland Kitchen levothyroxine (SYNTHROID, LEVOTHROID) 200 MCG tablet Take 200 mcg by mouth daily.        Marland Kitchen lisdexamfetamine (VYVANSE) 70 MG capsule Take 70 mg by mouth every morning.        . Multiple Vitamin (MULTIVITAMIN) tablet Take 1 tablet by mouth daily.        Marland Kitchen NARATRIPTAN HCL PO Take by mouth as directed, pt unsure of strength.      . nitrofurantoin (MACRODANTIN) 100 MG capsule Take 100 mg by mouth as directed. With sexual activity       . pantoprazole (PROTONIX) 40 MG tablet Take 1 tablet (40 mg total) by mouth daily.  30 tablet  6  . simethicone (MYLICON) 993 MG chewable tablet Chew 125 mg by mouth 3 (three) times daily.        Marland Kitchen spironolactone (ALDACTONE) 25 MG tablet Take 25 mg by mouth 2 (two) times daily.        . traMADol (ULTRAM) 50 MG tablet TAKE ONE TABLET BY MOUTH EVERY MORNING , 1 TABLET AT LUNCH AND 2 TABLETS AT BEDTIME  120 tablet  5  . traZODone (DESYREL) 150 MG tablet Take 150 mg by mouth at bedtime.        . valACYclovir (VALTREX) 500 MG tablet Take 500 mg by mouth 2 (two) times daily.        Marland Kitchen venlafaxine (EFFEXOR) 75 MG tablet Take 75 mg by mouth 2 (two) times daily.              Review of Systems Review of Systems  Constitutional: Negative for fever, appetite change,  and unexpected weight change.  Eyes: Negative for pain and visual disturbance.  Respiratory: Negative for cough and shortness of breath.   Cardiovascular: Negative for cp or palpitations    Gastrointestinal: Negative for nausea, diarrhea and constipation.  Genitourinary: Negative for urgency and frequency.  Skin: Negative for pallor or rash   Neurological: Negative for weakness, light-headedness, numbness MSK pos for joint and  muscle pain without swelling  Hematological: Negative for adenopathy. Does not bruise/bleed easily.  Psychiatric/Behavioral: Negative for dysphoric mood. The patient is not nervous/anxious.          Objective:   Physical Exam  Constitutional: She is oriented to person, place, and time. She appears well-developed and well-nourished. No distress.       Obese and well appearing   HENT:  Head: Normocephalic and atraumatic.  Right Ear: External ear normal.  Left Ear: External ear normal.  Mouth/Throat: Oropharynx is clear and moist. No oropharyngeal exudate.  Eyes: Conjunctivae and EOM are normal. Pupils are equal, round, and reactive to light.  Neck: Normal range of motion. Neck supple. No JVD present. Carotid bruit is not present. No thyromegaly present.  Cardiovascular: Normal rate, regular rhythm, normal heart sounds and intact distal pulses.  Exam reveals no gallop.   Pulmonary/Chest: Effort normal and breath sounds normal. No respiratory distress. She has no wheezes.  Abdominal: Soft. Bowel sounds are normal. She exhibits no distension, no abdominal bruit and no mass.  There is no tenderness.  Musculoskeletal: Normal range of motion. She exhibits tenderness. She exhibits no edema.       Positive trigger points on back  Diffuse tenderness myofascial-- extremeties and neck  Lymphadenopathy:    She has no cervical adenopathy.  Neurological: She is alert and oriented to person, place, and time. She has normal reflexes. No cranial nerve deficit. She exhibits normal muscle tone. Coordination normal.  Skin: Skin is warm and dry. No rash noted. No erythema. No pallor.  Psychiatric: Her behavior is normal. Judgment and thought content normal. Her affect is blunt. Her speech is delayed. Cognition and memory are normal. She exhibits a depressed mood. She expresses no suicidal ideation.       Seems fatigued Diffuse psychomotor slowing         Assessment & Plan:

## 2011-04-24 LAB — VITAMIN D 25 HYDROXY (VIT D DEFICIENCY, FRACTURES): Vit D, 25-Hydroxy: 42 ng/mL (ref 30–89)

## 2011-04-25 ENCOUNTER — Encounter: Payer: Self-pay | Admitting: *Deleted

## 2011-04-25 NOTE — Assessment & Plan Note (Signed)
Worse lately with fatigue and depressive and pain symptoms  She requests new rheumatology eval Will refer for that  Working on depression  Disc importance of low impact exercise- even if fatigued Will see a counselor  Also suggested consideration of accupuncture

## 2011-04-25 NOTE — Assessment & Plan Note (Signed)
Suspect related to fibromyalgia and depression  Will also check labs to r/o organic causes

## 2011-04-25 NOTE — Assessment & Plan Note (Signed)
On supplement Level today

## 2011-04-25 NOTE — Assessment & Plan Note (Signed)
Pt is acting more depressed today - may play a role in fatigue Some stressors ie: fibromyalgia  Will refer for counseling  She will see her psychiatrist also

## 2011-04-26 ENCOUNTER — Telehealth: Payer: Self-pay

## 2011-04-26 NOTE — Telephone Encounter (Signed)
Pt requested results from recent lab work. I reviewed with pt and explained she would be receiving a letter with lab values as well. Pt wanted to know when she should make that f/u appt. To discuss labs with Dr Glori Bickers. Pt can be reached [phone]. Pt said next week would be OK for response.

## 2011-04-28 NOTE — Telephone Encounter (Signed)
Sorry about that - I did not specify Labs all ok - mail her a copy F/u in about 6 months , thanks

## 2011-04-29 ENCOUNTER — Ambulatory Visit: Payer: 59 | Admitting: Family Medicine

## 2011-04-29 NOTE — Telephone Encounter (Signed)
Patient advised as instructed via telephone, patient already has a copy of lab results.

## 2011-05-15 ENCOUNTER — Ambulatory Visit: Payer: 59 | Admitting: Psychology

## 2011-06-19 ENCOUNTER — Ambulatory Visit: Payer: 59 | Admitting: Psychology

## 2011-08-06 ENCOUNTER — Other Ambulatory Visit: Payer: Self-pay | Admitting: Family Medicine

## 2011-08-06 NOTE — Telephone Encounter (Signed)
Ok to refill? Last OV was 04/23/11. Next follow up appt is 10/23/11.

## 2011-08-06 NOTE — Telephone Encounter (Signed)
Will refill electronically  

## 2011-09-03 ENCOUNTER — Other Ambulatory Visit: Payer: Self-pay | Admitting: Family Medicine

## 2011-09-03 NOTE — Telephone Encounter (Signed)
Spoke to patient she stated that she been getting a monthly supply also she is agreeable with a 3 month supply since that is what the order is for.

## 2011-09-03 NOTE — Telephone Encounter (Signed)
Request for Cyclobenzaprine 10 mg last Ov was 04/23/11. Last filled 12/05/10. Ok to refill?

## 2011-09-03 NOTE — Telephone Encounter (Signed)
It looks like she got 180 pills with 3 refills late last nov --should not need refil yet?  Ask her and let me know , thanks

## 2011-09-03 NOTE — Telephone Encounter (Signed)
Will refill electronically  

## 2011-09-14 ENCOUNTER — Other Ambulatory Visit: Payer: Self-pay | Admitting: Family Medicine

## 2011-10-23 ENCOUNTER — Encounter: Payer: Self-pay | Admitting: Family Medicine

## 2011-10-23 ENCOUNTER — Ambulatory Visit (INDEPENDENT_AMBULATORY_CARE_PROVIDER_SITE_OTHER): Payer: 59 | Admitting: Family Medicine

## 2011-10-23 VITALS — BP 126/74 | HR 66 | Temp 98.4°F | Ht 66.5 in | Wt 234.8 lb

## 2011-10-23 DIAGNOSIS — Z23 Encounter for immunization: Secondary | ICD-10-CM

## 2011-10-23 DIAGNOSIS — I10 Essential (primary) hypertension: Secondary | ICD-10-CM

## 2011-10-23 DIAGNOSIS — F329 Major depressive disorder, single episode, unspecified: Secondary | ICD-10-CM

## 2011-10-23 DIAGNOSIS — E039 Hypothyroidism, unspecified: Secondary | ICD-10-CM

## 2011-10-23 DIAGNOSIS — F3289 Other specified depressive episodes: Secondary | ICD-10-CM

## 2011-10-23 DIAGNOSIS — IMO0001 Reserved for inherently not codable concepts without codable children: Secondary | ICD-10-CM

## 2011-10-23 NOTE — Assessment & Plan Note (Signed)
Much stress with work and health Could not afford counseling here Ref to [employer] - ? More affordable option?

## 2011-10-23 NOTE — Progress Notes (Signed)
Subjective:    Patient ID: Lindsay Fry, female    DOB: 09/10/1960, 51 y.o.   MRN: [id number]  HPI Here for f/u of chronic conditions  More paperwork for special accomidations for work - needs to work from home  Productivity issues due to fibromyalgia and depression  Able to do her job - but some days are better than others- diff concentrating/ pain/ fatigue   Last visit disc fatigue/ depression and fibromyalgia  Saw rheumatologist- consulted and sent her back - ordered PT  In general is doing a bit better -can get around a bit more , though pain will be worse with cooler weather   Fatigue comes and goes  Easily wiped out   Nothing special came out of labs Lab Results  Component Value Date   TSH 1.64 04/23/2011     Wt is up 1 lb with bmi of 37  Flu shot today--good   bp is stable today  No cp or palpitations or headaches or edema  No side effects to medicines  BP Readings from Last 3 Encounters:  10/23/11 126/74  04/23/11 130/82  04/08/11 110/88      Diet is fair -- needs to start going to the grocery store with her husband - he buys more processed foods  Not able to do any exercise at all   Also fell in ocean and strained her knee - in a brace   Would love to go to aquatic center- timing is poor for her schedule with work -- is trying to figure that out   Depression- just saw Dr Azucena Freed changes  A lot of stress- with work / not making her quota Less tearful  Did not go to the counselor - could not afford  Trying to get in [place]  Patient Active Problem List  Diagnosis  . BENIGN NEOPLASM OTH&UNSPEC SITE DIGESTIVE SYSTEM  . HYPOTHYROIDISM  . UNSPECIFIED VITAMIN D DEFICIENCY  . HYPERCHOLESTEROLEMIA  . ANXIETY  . DEPRESSION  . HYPERTENSION  . ALLERGIC RHINITIS  . ASTHMA  . GERD  . HIATAL HERNIA  . COLITIS, ULCERATIVE  . ACNE ROSACEA  . FIBROMYALGIA  . INSOMNIA  . FATIGUE  . COLONIC POLYPS, ADENOMATOUS, HX OF  . MIGRAINES, HX OF  . POSTMENOPAUSAL  STATUS  . Routine general medical examination at a health care facility  . Bronchitis  . Other screening mammogram  . Lower back pain  . URI (upper respiratory infection)   Past Medical History  Diagnosis Date  . Rosacea   . Family history of malignant neoplasm of gastrointestinal tract   . Allergic rhinitis, cause unspecified   . Anxiety state, unspecified   . Unspecified asthma   . Benign neoplasm of other and unspecified site of the digestive system   . Ulcerative colitis, unspecified   . Personal history of colonic polyps   . Depressive disorder, not elsewhere classified   . Other malaise and fatigue   . Myalgia and myositis, unspecified   . Esophageal reflux   . Routine general medical examination at a health care facility   . Diaphragmatic hernia without mention of obstruction or gangrene   . Pure hypercholesterolemia   . Unspecified essential hypertension   . Unspecified hypothyroidism   . Insomnia, unspecified   . History of migraines   . Other screening mammogram   . Asymptomatic postmenopausal status (age-related) (natural)   . Unspecified vitamin D deficiency   . Edema   . Dysuria   . History of  ovarian cyst   . Cyst     on Achilles tendon   Past Surgical History  Procedure Date  . Wisdom tooth extraction 1980's  . Tonsillectomy 1984  . Cesarean section 1996/1997    placenta previa, gest DM, pre-eclampsia  . Nasal sinus surgery 05/1997  . Precancerous mole removed   . Cholecystectomy 1997    adhesions also  . Meckel diverticulum excision 1999  . Breast biopsy     on tamoxifen, atypical hyperplasia  . Dexa 04/1999 and 2010    normal  . Colonoscopy 01/2001    Ulcerative colitis  . Esophagogastroduodenoscopy 09/2001    polyp  . Exercise stress test 11/2003    negative  . Nuclear stress test 06/2007    negative  . Colonoscopy 04/2004    UC, polyp  . Colonoscopy 12/08    UC, no polyps  . Sleep study 11/09    no apnea, but did snore (done by [facility])     History  Substance Use Topics  . Smoking status: Former Smoker    Quit date: 01/14/1993  . Smokeless tobacco: Not on file  . Alcohol Use: No     Recovered ETOH   Family History  Problem Relation Age of Onset  . Coronary artery disease Father   . Colon cancer Father   . Alzheimer's disease Father   . Coronary artery disease Mother   . Hypertension Mother   . Osteoporosis Mother   . Colitis Mother   . Crohn's disease Mother   . Breast cancer      great aunts  . Coronary artery disease      Uncle (also AAA)  . Diabetes      remote family history  . Stroke Cousin   . Esophageal cancer Neg Hx   . Rectal cancer Neg Hx   . Stomach cancer Neg Hx    Allergies  Allergen Reactions  . Aspirin     REACTION: aggrivates colitis  . Erythromycin     REACTION: GI upset  . Nsaids     REACTION: aggrivate colitis  . Rosuvastatin     REACTION: increased lfts   Current Outpatient Prescriptions on File Prior to Visit  Medication Sig Dispense Refill  . acetaminophen (TYLENOL ARTHRITIS PAIN) 650 MG CR tablet Take 1,300 mg by mouth every 8 (eight) hours as needed.        Marland Kitchen amitriptyline (ELAVIL) 50 MG tablet Take 50 mg by mouth at bedtime.       Marland Kitchen amLODipine (NORVASC) 10 MG tablet Take 1 tablet (10 mg total) by mouth at bedtime.  90 tablet  3  . ARIPiprazole (ABILIFY) 10 MG tablet Take 10 mg by mouth daily.        Marland Kitchen atorvastatin (LIPITOR) 40 MG tablet TAKE  1/2 TABLET BY MOUTH EVERY DAY  15 tablet  3  . bisacodyl (BISACODYL) 5 MG EC tablet Take 25 mg by mouth at bedtime.       Marland Kitchen buPROPion (WELLBUTRIN XL) 150 MG 24 hr tablet Take 450 mg by mouth daily.        . busPIRone (BUSPAR) 15 MG tablet Take 15 mg by mouth as directed. Take one at bedtime; may repeat once as needed for anxiety       . Calcium-Vitamin D-Vitamin K (VIACTIV) 500-100-40 MG-UNT-MCG CHEW Chew 2 tablets by mouth 2 (two) times daily.        . chlorproMAZINE (THORAZINE) 25 MG tablet Take 25-50 mg by mouth 3 (  three) times  daily. For migraines if frova doesn't help.       . Clindamycin Phosphate foam       . cyclobenzaprine (FLEXERIL) 10 MG tablet TAKE 2 TABLETS BY MOUTH AT BEDTIME  180 tablet  0  . dextroamphetamine (DEXEDRINE SPANSULE) 15 MG 24 hr capsule Take 15 mg by mouth daily.       . diclofenac sodium (VOLTAREN) 1 % GEL Apply 1 application topically 4 (four) times daily.        . fluticasone (FLONASE) 50 MCG/ACT nasal spray 2 sprays by Nasal route 2 (two) times daily.        Marland Kitchen latanoprost (XALATAN) 0.005 % ophthalmic solution Place 1 drop into both eyes At bedtime.      Marland Kitchen levothyroxine (SYNTHROID, LEVOTHROID) 200 MCG tablet Take 200 mcg by mouth daily.        Marland Kitchen lisdexamfetamine (VYVANSE) 70 MG capsule Take 70 mg by mouth every morning.        . Multiple Vitamin (MULTIVITAMIN) tablet Take 1 tablet by mouth daily.        Marland Kitchen NARATRIPTAN HCL PO Take by mouth as directed, pt unsure of strength.      . nitrofurantoin (MACRODANTIN) 100 MG capsule Take 100 mg by mouth as directed. With sexual activity       . pantoprazole (PROTONIX) 40 MG tablet TAKE 1 TABLET BY MOUTH EVERY DAY  30 tablet  11  . simethicone (MYLICON) 125 MG chewable tablet Chew 125 mg by mouth 3 (three) times daily.        Marland Kitchen spironolactone (ALDACTONE) 25 MG tablet Take 25 mg by mouth 2 (two) times daily.        . traMADol (ULTRAM) 50 MG tablet TAKE ONE TABLET BY MOUTH EVERY MORNING , 1 TABLET AT LUNCH AND 2 TABLETS AT BEDTIME  120 tablet  5  . traZODone (DESYREL) 150 MG tablet Take 150 mg by mouth at bedtime.        . valACYclovir (VALTREX) 500 MG tablet Take 500 mg by mouth 2 (two) times daily.        Marland Kitchen venlafaxine (EFFEXOR) 75 MG tablet Take 75 mg by mouth 2 (two) times daily.            Review of Systems Review of Systems  Constitutional: Negative for fever, appetite change, and unexpected weight change. pos for chronic fatigue Eyes: Negative for pain and visual disturbance.  Respiratory: Negative for cough and shortness of breath.     Cardiovascular: Negative for cp or palpitations    Gastrointestinal: Negative for nausea, diarrhea and constipation.  Genitourinary: Negative for urgency and frequency.  Skin: Negative for pallor or rash   MSK pos for chronic muscle and joint pain without swelling  Neurological: Negative for weakness, light-headedness, numbness and headaches.  Hematological: Negative for adenopathy. Does not bruise/bleed easily.  Psychiatric/Behavioral: pos for depression and anxiety and stressors       Objective:   Physical Exam  Constitutional: She appears well-developed and well-nourished. No distress.       obese and well appearing   HENT:  Head: Normocephalic and atraumatic.  Mouth/Throat: Oropharynx is clear and moist.  Eyes: Conjunctivae normal and EOM are normal. Pupils are equal, round, and reactive to light. No scleral icterus.  Neck: Normal range of motion. Neck supple. No JVD present. Carotid bruit is not present. No thyromegaly present.  Cardiovascular: Normal rate, regular rhythm and normal heart sounds.  Exam reveals no gallop.  Pulmonary/Chest: Effort normal and breath sounds normal. No respiratory distress. She has no wheezes.  Abdominal: Soft. Bowel sounds are normal. She exhibits no distension, no abdominal bruit and no mass. There is no tenderness.  Musculoskeletal: She exhibits tenderness. She exhibits no edema.       Diffuse myofascial tenderness No swollen joints  Lymphadenopathy:    She has no cervical adenopathy.  Neurological: She is alert. She has normal reflexes. No cranial nerve deficit. She exhibits normal muscle tone. Coordination normal.  Skin: Skin is warm and dry. No rash noted. No erythema. No pallor.  Psychiatric: Her speech is normal. Judgment and thought content normal. Her mood appears not anxious. Her affect is blunt. She is slowed. She exhibits a depressed mood. She expresses no suicidal ideation. She expresses no suicidal plans.       Cognition is generally  slowed          Assessment & Plan:

## 2011-10-23 NOTE — Assessment & Plan Note (Signed)
Hypothyroidism  Pt has no clinical changes No change in energy level/ hair or skin/ edema and no tremor Lab Results  Component Value Date   TSH 1.64 04/23/2011

## 2011-10-23 NOTE — Patient Instructions (Signed)
Flu shot today  Look into water exercise -- ? Weekends/ water walking  Keep working on [employer] loss - that is very important  Continue psychiatric follow ups  We will counseling referral in [place]

## 2011-10-23 NOTE — Assessment & Plan Note (Signed)
No changes Work form filled out for [employer] I stressed imp of exercise and wt loss Her sedentary life is making things worse

## 2011-10-23 NOTE — Assessment & Plan Note (Signed)
bp in fair control at this time  No changes needed  Disc lifstyle change with low sodium diet and exercise  Labs rev with pt

## 2011-10-28 ENCOUNTER — Other Ambulatory Visit: Payer: Self-pay | Admitting: Family Medicine

## 2011-10-28 NOTE — Telephone Encounter (Signed)
Will refill electronically  

## 2011-10-28 NOTE — Telephone Encounter (Signed)
Ok to refill 

## 2011-11-26 ENCOUNTER — Ambulatory Visit: Payer: 59 | Admitting: Psychology

## 2011-12-03 ENCOUNTER — Other Ambulatory Visit: Payer: Self-pay | Admitting: Family Medicine

## 2011-12-04 ENCOUNTER — Other Ambulatory Visit: Payer: Self-pay | Admitting: *Deleted

## 2011-12-04 NOTE — Telephone Encounter (Signed)
Ok to refill 

## 2011-12-04 NOTE — Telephone Encounter (Signed)
She can have refils for a year-thanks

## 2011-12-04 NOTE — Telephone Encounter (Signed)
She can have a year of refils for both  I think I already responded to the elavil earlier today

## 2011-12-10 ENCOUNTER — Other Ambulatory Visit: Payer: Self-pay | Admitting: Family Medicine

## 2011-12-10 NOTE — Telephone Encounter (Signed)
Ok to refill 

## 2011-12-10 NOTE — Telephone Encounter (Signed)
Can refil that for 6 months, thanks

## 2012-01-24 ENCOUNTER — Encounter: Payer: Self-pay | Admitting: Family Medicine

## 2012-01-24 ENCOUNTER — Ambulatory Visit (INDEPENDENT_AMBULATORY_CARE_PROVIDER_SITE_OTHER): Payer: 59 | Admitting: Family Medicine

## 2012-01-24 VITALS — BP 132/86 | HR 115 | Temp 98.5°F | Ht 66.5 in | Wt 234.5 lb

## 2012-01-24 DIAGNOSIS — H8110 Benign paroxysmal vertigo, unspecified ear: Secondary | ICD-10-CM | POA: Insufficient documentation

## 2012-01-24 DIAGNOSIS — J329 Chronic sinusitis, unspecified: Secondary | ICD-10-CM | POA: Insufficient documentation

## 2012-01-24 NOTE — Assessment & Plan Note (Signed)
I do feel that eventually pt needs to see ENT about this - it could contribute to chronic malaise and this episode of vertigo

## 2012-01-24 NOTE — Patient Instructions (Addendum)
I think your vertigo is getting better - alert me if it worsens again  Take meclizine if needed and change positions slowly  No change in your medicines  You eventually need to see ENT - for chronic sinusitis

## 2012-01-24 NOTE — Assessment & Plan Note (Signed)
For about a week- much improved now  May be related to virus or ETD Nl exam- reassuring  Update if not starting to improve in a week or if worsening

## 2012-01-24 NOTE — Progress Notes (Signed)
Subjective:    Patient ID: Lindsay Fry, female    DOB: 08/29/1960, 52 y.o.   MRN: [id number]  HPI Here for f/u of vertigo after urgent care visit 01/20/12  Has had vertigo all week - started on Sunday while she was shopping - got worse  [facility] Was given some meclizine Wed was the worst- then gradually got better- today is almost back to normal   She did hit her head- but that was before xmas- and she was ok after that   No new neurol problems or symptoms  No fever  Has had sinus problems  Ears are not painful or full feeling  Last night had a migraine- feels better   Is blowing yellow green mucous  Tender and painful under and around eyes  This is chronic and never goes away No cough Had sinus surgery a long time ago   Patient Active Problem List  Diagnosis  . BENIGN NEOPLASM OTH&UNSPEC SITE DIGESTIVE SYSTEM  . HYPOTHYROIDISM  . UNSPECIFIED VITAMIN D DEFICIENCY  . HYPERCHOLESTEROLEMIA  . ANXIETY  . DEPRESSION  . HYPERTENSION  . ALLERGIC RHINITIS  . ASTHMA  . GERD  . HIATAL HERNIA  . COLITIS, ULCERATIVE  . ACNE ROSACEA  . FIBROMYALGIA  . INSOMNIA  . FATIGUE  . COLONIC POLYPS, ADENOMATOUS, HX OF  . MIGRAINES, HX OF  . POSTMENOPAUSAL STATUS  . Routine general medical examination at a health care facility  . Other screening mammogram   Past Medical History  Diagnosis Date  . Rosacea   . Family history of malignant neoplasm of gastrointestinal tract   . Allergic rhinitis, cause unspecified   . Anxiety state, unspecified   . Unspecified asthma   . Benign neoplasm of other and unspecified site of the digestive system   . Ulcerative colitis, unspecified   . Personal history of colonic polyps   . Depressive disorder, not elsewhere classified   . Other malaise and fatigue   . Myalgia and myositis, unspecified   . Esophageal reflux   . Routine general medical examination at a health care facility   .  Diaphragmatic hernia without mention of obstruction or gangrene   . Pure hypercholesterolemia   . Unspecified essential hypertension   . Unspecified hypothyroidism   . Insomnia, unspecified   . History of migraines   . Other screening mammogram   . Asymptomatic postmenopausal status (age-related) (natural)   . Unspecified vitamin D deficiency   . Edema   . Dysuria   . History of ovarian cyst   . Cyst     on Achilles tendon   Past Surgical History  Procedure Date  . Wisdom tooth extraction 1980's  . Tonsillectomy 1984  . Cesarean section 1996/1997    placenta previa, gest DM, pre-eclampsia  . Nasal sinus surgery 05/1997  . Precancerous mole removed   . Cholecystectomy 1997    adhesions also  . Meckel diverticulum excision 1999  . Breast biopsy     on tamoxifen, atypical hyperplasia  . Dexa 04/1999 and 2010    normal  . Colonoscopy 01/2001    Ulcerative colitis  . Esophagogastroduodenoscopy 09/2001    polyp  . Exercise stress test 11/2003    negative  . Nuclear stress test 06/2007    negative  . Colonoscopy 04/2004    UC, polyp  . Colonoscopy 12/08    UC, no polyps  . Sleep study 11/09    no  apnea, but did snore (done by [facility])   History  Substance Use Topics  . Smoking status: Former Smoker    Quit date: 01/14/1993  . Smokeless tobacco: Not on file  . Alcohol Use: No     Comment: Recovered ETOH   Family History  Problem Relation Age of Onset  . Coronary artery disease Father   . Colon cancer Father   . Alzheimer's disease Father   . Coronary artery disease Mother   . Hypertension Mother   . Osteoporosis Mother   . Colitis Mother   . Crohn's disease Mother   . Breast cancer      great aunts  . Coronary artery disease      Uncle (also AAA)  . Diabetes      remote family history  . Stroke Cousin   . Esophageal cancer Neg Hx   . Rectal cancer Neg Hx   . Stomach cancer Neg Hx    Allergies  Allergen Reactions  . Aspirin     REACTION: aggrivates  colitis  . Erythromycin     REACTION: GI upset  . Nsaids     REACTION: aggrivate colitis  . Rosuvastatin     REACTION: increased lfts   Current Outpatient Prescriptions on File Prior to Visit  Medication Sig Dispense Refill  . acetaminophen (TYLENOL ARTHRITIS PAIN) 650 MG CR tablet Take 1,300 mg by mouth every 8 (eight) hours as needed.        Marland Kitchen amitriptyline (ELAVIL) 10 MG tablet TAKE 5 TABLETS BY MOUTH EVERY NIGHT AT BEDTIME  150 tablet  11  . amLODipine (NORVASC) 10 MG tablet TAKE 1 TABLET BY MOUTH EVERY NIGHT AT BEDTIME  90 tablet  3  . ARIPiprazole (ABILIFY) 10 MG tablet Take 10 mg by mouth daily.        Marland Kitchen atorvastatin (LIPITOR) 40 MG tablet TAKE  1/2 TABLET BY MOUTH EVERY DAY  15 tablet  3  . bisacodyl (BISACODYL) 5 MG EC tablet Take 25 mg by mouth at bedtime.       Marland Kitchen buPROPion (WELLBUTRIN XL) 150 MG 24 hr tablet Take 450 mg by mouth daily.        . busPIRone (BUSPAR) 15 MG tablet Take 15 mg by mouth as directed. Take one at bedtime; may repeat once as needed for anxiety       . Calcium-Vitamin D-Vitamin K (VIACTIV) 500-100-40 MG-UNT-MCG CHEW Chew 2 tablets by mouth 2 (two) times daily.        . chlorproMAZINE (THORAZINE) 25 MG tablet Take 25-50 mg by mouth 3 (three) times daily. For migraines if frova doesn't help.       . Clindamycin Phosphate foam       . cyclobenzaprine (FLEXERIL) 10 MG tablet TAKE 2 TABLETS BY MOUTH AT BEDTIME  180 tablet  1  . dextroamphetamine (DEXEDRINE SPANSULE) 15 MG 24 hr capsule Take 15 mg by mouth 3 (three) times daily.       . diclofenac sodium (VOLTAREN) 1 % GEL Apply 1 application topically 4 (four) times daily.        . fluticasone (FLONASE) 50 MCG/ACT nasal spray 2 sprays by Nasal route 2 (two) times daily.        Marland Kitchen latanoprost (XALATAN) 0.005 % ophthalmic solution Place 1 drop into both eyes At bedtime.      Marland Kitchen levothyroxine (SYNTHROID, LEVOTHROID) 200 MCG tablet Take 224 mcg by mouth daily.       Marland Kitchen lisdexamfetamine (VYVANSE) 70 MG  capsule Take 70  mg by mouth every morning.        . Multiple Vitamin (MULTIVITAMIN) tablet Take 1 tablet by mouth daily.        Marland Kitchen NARATRIPTAN HCL PO Take by mouth as directed, pt unsure of strength.      . nitrofurantoin (MACRODANTIN) 100 MG capsule Take 100 mg by mouth as directed. With sexual activity       . pantoprazole (PROTONIX) 40 MG tablet TAKE 1 TABLET BY MOUTH EVERY DAY  30 tablet  11  . simethicone (MYLICON) 125 MG chewable tablet Chew 125 mg by mouth 3 (three) times daily.        Marland Kitchen spironolactone (ALDACTONE) 25 MG tablet Take 25 mg by mouth 2 (two) times daily.        . traMADol (ULTRAM) 50 MG tablet TAKE ONE TABLET BY MOUTH EVERY MORNING , 1 TABLET AT LUNCH AND 2 TABLETS AT BEDTIME  120 tablet  5  . traZODone (DESYREL) 150 MG tablet Take 450 mg by mouth at bedtime.       . valACYclovir (VALTREX) 500 MG tablet Take 500 mg by mouth 2 (two) times daily.        Marland Kitchen venlafaxine (EFFEXOR) 75 MG tablet Take 75 mg by mouth 2 (two) times daily.            Review of Systems Review of Systems  Constitutional: Negative for fever, appetite change, fatigue and unexpected weight change.  Eyes: Negative for pain and visual disturbance.  ENT pos for congestion and ethmoid sinus pain and yellow drainage in ams  Respiratory: Negative for cough and shortness of breath.   Cardiovascular: Negative for cp or palpitations    Gastrointestinal: Negative for nausea, diarrhea and constipation.  Genitourinary: Negative for urgency and frequency.  Skin: Negative for pallor or rash   Neurological: Negative for weakness, light-headedness, numbness and pos for migraines and also for vertigo that is getting better  Hematological: Negative for adenopathy. Does not bruise/bleed easily.  Psychiatric/Behavioral: Negative for dysphoric mood. The patient is not nervous/anxious.         Objective:   Physical Exam  Constitutional: She is oriented to person, place, and time. She appears well-developed and well-nourished. No  distress.       obese and well appearing   HENT:  Head: Normocephalic and atraumatic.  Right Ear: External ear normal.  Left Ear: External ear normal.  Mouth/Throat: Oropharynx is clear and moist. No oropharyngeal exudate.       Nares are boggy and slt injected with clear rhinorrhea Mild maxillary sinus tenderness Throat clear  Eyes: Conjunctivae normal and EOM are normal. Pupils are equal, round, and reactive to light. Right eye exhibits no discharge. Left eye exhibits no discharge. No scleral icterus.       1-2 beats of horizontal nystagmus bilaterally  Neck: Normal range of motion. Neck supple. No thyromegaly present.  Cardiovascular: Normal rate, regular rhythm, normal heart sounds and intact distal pulses.  Exam reveals no gallop.   Pulmonary/Chest: Effort normal and breath sounds normal. No respiratory distress. She has no wheezes.  Musculoskeletal: She exhibits no edema.  Lymphadenopathy:    She has no cervical adenopathy.  Neurological: She is alert and oriented to person, place, and time. She has normal reflexes. She displays no atrophy and no tremor. No cranial nerve deficit or sensory deficit. She exhibits normal muscle tone. Coordination and gait normal.       No focal cerebellar signs  Skin: Skin is warm and dry. No rash noted. No erythema. No pallor.  Psychiatric: She has a normal mood and affect.          Assessment & Plan:

## 2012-02-21 ENCOUNTER — Encounter: Payer: Self-pay | Admitting: *Deleted

## 2012-03-06 ENCOUNTER — Encounter: Payer: Self-pay | Admitting: Family Medicine

## 2012-03-06 ENCOUNTER — Ambulatory Visit (INDEPENDENT_AMBULATORY_CARE_PROVIDER_SITE_OTHER): Payer: 59 | Admitting: Family Medicine

## 2012-03-06 VITALS — BP 128/88 | HR 98 | Temp 98.7°F | Ht 66.5 in | Wt 231.2 lb

## 2012-03-06 DIAGNOSIS — J019 Acute sinusitis, unspecified: Secondary | ICD-10-CM

## 2012-03-06 DIAGNOSIS — B9689 Other specified bacterial agents as the cause of diseases classified elsewhere: Secondary | ICD-10-CM

## 2012-03-06 MED ORDER — FLUTICASONE PROPIONATE 50 MCG/ACT NA SUSP: 2.0000 | Freq: Two times a day (BID) | NASAL | Status: DC

## 2012-03-06 MED ORDER — AMOXICILLIN-POT CLAVULANATE 875-125 MG PO TABS: 1.0000 | ORAL_TABLET | Freq: Two times a day (BID) | ORAL | Status: DC

## 2012-03-06 NOTE — Progress Notes (Signed)
Subjective:    Patient ID: Lindsay Fry, female    DOB: 04/18/1960, 52 y.o.   MRN: [id number]  HPI Here for sinus issues Has had sinus congestion for 2 weeks -thought it was allergies (not a cold) Now symptoms are worse  More chest congestion also  Temp higher than her usual achey and chilled  Some pain around eyes - green thick nasal discharge   Is taking mucinex  Is pretty congested/ nasal   Not coughing up much   Patient Active Problem List  Diagnosis  . BENIGN NEOPLASM OTH&UNSPEC SITE DIGESTIVE SYSTEM  . HYPOTHYROIDISM  . UNSPECIFIED VITAMIN D DEFICIENCY  . HYPERCHOLESTEROLEMIA  . ANXIETY  . DEPRESSION  . HYPERTENSION  . ALLERGIC RHINITIS  . ASTHMA  . GERD  . HIATAL HERNIA  . COLITIS, ULCERATIVE  . ACNE ROSACEA  . FIBROMYALGIA  . INSOMNIA  . FATIGUE  . COLONIC POLYPS, ADENOMATOUS, HX OF  . MIGRAINES, HX OF  . POSTMENOPAUSAL STATUS  . Routine general medical examination at a health care facility  . Other screening mammogram  . Chronic sinusitis  . Vertigo, benign positional   Past Medical History  Diagnosis Date  . Rosacea   . Family history of malignant neoplasm of gastrointestinal tract   . Allergic rhinitis, cause unspecified   . Anxiety state, unspecified   . Unspecified asthma   . Benign neoplasm of other and unspecified site of the digestive system   . Ulcerative colitis, unspecified   . Depression   . Fibromyalgia   . GERD (gastroesophageal reflux disease)   . Hiatal hernia   . Pure hypercholesterolemia   . Unspecified essential hypertension   . Unspecified hypothyroidism   . Insomnia, unspecified   . History of migraines   . Other screening mammogram   . Asymptomatic postmenopausal status (age-related) (natural)   . Unspecified vitamin D deficiency   . Edema   . Dysuria   . History of ovarian cyst   . Cyst     on Achilles tendon  . Tubular adenoma of colon 01/10/11   Past Surgical History  Procedure Laterality Date  . Wisdom  tooth extraction  1980's  . Tonsillectomy  1984  . Cesarean section  1996/1997    placenta previa, gest DM, pre-eclampsia  . Nasal sinus surgery  05/1997  . Precancerous mole removed    . Cholecystectomy  1997    adhesions also  . Meckel diverticulum excision  1999  . Breast biopsy      on tamoxifen, atypical hyperplasia  . Dexa  04/1999 and 2010    normal  . Colonoscopy  01/2001    Ulcerative colitis  . Esophagogastroduodenoscopy  09/2001    polyp  . Exercise stress test  11/2003    negative  . Nuclear stress test  06/2007    negative  . Colonoscopy  04/2004    UC, polyp  . Colonoscopy  12/08    UC, no polyps  . Sleep study  11/09    no apnea, but did snore (done by [facility])   History  Substance Use Topics  . Smoking status: Former Smoker    Quit date: 01/14/1993  . Smokeless tobacco: Not on file  . Alcohol Use: No     Comment: Recovered ETOH   Family History  Problem Relation Age of Onset  . Coronary artery disease Father   . Colon cancer Father   . Alzheimer's disease Father   . Coronary artery disease Mother   .  Hypertension Mother   . Osteoporosis Mother   . Colitis Mother   . Crohn's disease Mother   . Breast cancer      great aunts  . Coronary artery disease      Uncle (also AAA)  . Diabetes      remote family history  . Stroke Cousin   . Esophageal cancer Neg Hx   . Rectal cancer Neg Hx   . Stomach cancer Neg Hx    Allergies  Allergen Reactions  . Erythromycin     REACTION: GI upset  . Nsaids     REACTION: aggrivate colitis  . Rosuvastatin     REACTION: increased lfts   Current Outpatient Prescriptions on File Prior to Visit  Medication Sig Dispense Refill  . acetaminophen (TYLENOL ARTHRITIS PAIN) 650 MG CR tablet Take 1,300 mg by mouth every 8 (eight) hours as needed.        Marland Kitchen amitriptyline (ELAVIL) 10 MG tablet TAKE 5 TABLETS BY MOUTH EVERY NIGHT AT BEDTIME  150 tablet  11  . amLODipine (NORVASC) 10 MG tablet TAKE 1 TABLET BY MOUTH EVERY  NIGHT AT BEDTIME  90 tablet  3  . ARIPiprazole (ABILIFY) 10 MG tablet Take 10 mg by mouth daily.        Marland Kitchen atorvastatin (LIPITOR) 40 MG tablet TAKE  1/2 TABLET BY MOUTH EVERY DAY  15 tablet  3  . bisacodyl (BISACODYL) 5 MG EC tablet Take 25 mg by mouth at bedtime.       Marland Kitchen buPROPion (WELLBUTRIN XL) 150 MG 24 hr tablet Take 450 mg by mouth daily.        . busPIRone (BUSPAR) 15 MG tablet Take 15 mg by mouth as directed. Take one at bedtime; may repeat once as needed for anxiety       . Calcium-Vitamin D-Vitamin K (VIACTIV) 009-381-82 MG-UNT-MCG CHEW Chew 2 tablets by mouth 2 (two) times daily.        . chlorproMAZINE (THORAZINE) 25 MG tablet Take 25-50 mg by mouth 3 (three) times daily. For migraines if frova doesn't help.       . Clindamycin Phosphate foam       . cyclobenzaprine (FLEXERIL) 10 MG tablet TAKE 2 TABLETS BY MOUTH AT BEDTIME  180 tablet  1  . dextroamphetamine (DEXEDRINE SPANSULE) 15 MG 24 hr capsule Take 15 mg by mouth 3 (three) times daily.       . diclofenac sodium (VOLTAREN) 1 % GEL Apply 1 application topically 4 (four) times daily.        . fluticasone (FLONASE) 50 MCG/ACT nasal spray 2 sprays by Nasal route 2 (two) times daily.        Marland Kitchen latanoprost (XALATAN) 0.005 % ophthalmic solution Place 1 drop into both eyes At bedtime.      Marland Kitchen levothyroxine (SYNTHROID, LEVOTHROID) 200 MCG tablet Take 224 mcg by mouth daily.       Marland Kitchen lisdexamfetamine (VYVANSE) 70 MG capsule Take 70 mg by mouth every morning.        . Multiple Vitamin (MULTIVITAMIN) tablet Take 1 tablet by mouth daily.        Marland Kitchen NARATRIPTAN HCL PO Take by mouth as directed, pt unsure of strength.      . nitrofurantoin (MACRODANTIN) 100 MG capsule Take 100 mg by mouth as directed. With sexual activity       . pantoprazole (PROTONIX) 40 MG tablet TAKE 1 TABLET BY MOUTH EVERY DAY  30 tablet  11  . simethicone (  MYLICON) 412 MG chewable tablet Chew 125 mg by mouth 3 (three) times daily.        Marland Kitchen spironolactone (ALDACTONE) 25 MG  tablet Take 25 mg by mouth 2 (two) times daily.        . traMADol (ULTRAM) 50 MG tablet TAKE ONE TABLET BY MOUTH EVERY MORNING , 1 TABLET AT LUNCH AND 2 TABLETS AT BEDTIME  120 tablet  5  . traZODone (DESYREL) 150 MG tablet Take 450 mg by mouth at bedtime.       . valACYclovir (VALTREX) 500 MG tablet Take 500 mg by mouth 2 (two) times daily.        Marland Kitchen venlafaxine (EFFEXOR) 75 MG tablet Take 75 mg by mouth 2 (two) times daily.         No current facility-administered medications on file prior to visit.      Review of Systems    Review of Systems  Constitutional: Negative for, appetite change,  and unexpected weight change. ENT pos for cong/ purulent nasal d/c and facial pain/ neg for ear pain or drainage  Eyes: Negative for pain and visual disturbance.  Respiratory: Negative for wheeze and shortness of breath.   Cardiovascular: Negative for cp or palpitations    Gastrointestinal: Negative for nausea, diarrhea and constipation.  Genitourinary: Negative for urgency and frequency.  Skin: Negative for pallor or rash   Neurological: Negative for weakness, light-headedness, numbness and headaches.  Hematological: Negative for adenopathy. Does not bruise/bleed easily.  Psychiatric/Behavioral: Negative for dysphoric mood. The patient is not nervous/anxious.      Objective:   Physical Exam  Constitutional: She appears well-developed and well-nourished. No distress.  HENT:  Head: Normocephalic and atraumatic.  Right Ear: External ear normal.  Left Ear: External ear normal.  Mouth/Throat: Oropharynx is clear and moist. No oropharyngeal exudate.  Nares are injected and congested  bilat ethmoid and maxillary sinus tenderness worse on the L  Eyes: Conjunctivae and EOM are normal. Pupils are equal, round, and reactive to light. Right eye exhibits no discharge. Left eye exhibits no discharge.  Neck: Normal range of motion. Neck supple.  Cardiovascular: Normal rate and regular rhythm.    Pulmonary/Chest: Effort normal and breath sounds normal. No respiratory distress. She has no wheezes.  Abdominal: Soft. Bowel sounds are normal.  Lymphadenopathy:    She has no cervical adenopathy.  Neurological: She is alert.  Skin: Skin is warm and dry. No rash noted.  Psychiatric: She has a normal mood and affect.          Assessment & Plan:

## 2012-03-06 NOTE — Patient Instructions (Signed)
Drink lots of fluids and use nasal saline  Take augmentin Update if not starting to improve in a week or if worsening

## 2012-03-06 NOTE — Assessment & Plan Note (Signed)
Acute on chronic sinusitis in pt with hx of all rhinitis Refilled flonase tx with augmentin Disc symptomatic care - see instructions on AVS  Update if not starting to improve in a week or if worsening

## 2012-03-17 ENCOUNTER — Encounter: Payer: Self-pay | Admitting: Family Medicine

## 2012-03-17 ENCOUNTER — Ambulatory Visit (INDEPENDENT_AMBULATORY_CARE_PROVIDER_SITE_OTHER): Payer: 59 | Admitting: Family Medicine

## 2012-03-17 VITALS — BP 104/62 | HR 102 | Temp 98.5°F | Ht 66.5 in | Wt 232.5 lb

## 2012-03-17 DIAGNOSIS — IMO0001 Reserved for inherently not codable concepts without codable children: Secondary | ICD-10-CM

## 2012-03-17 NOTE — Assessment & Plan Note (Addendum)
Long discussion about limitations (both physical and mental) caused by her fibromyalgia and its affect on her current job  She feels physically and mentally unable to do this job full time and in addition to that the stress of keeping up is worsening her symptoms as well Disc tx - and would not add anything in light of long list of medications/ including psychiatric  I still strongly recommend low impact exercise for her - as this has been shown to be most effective in studies - along with weight loss and proper eating  Pt is also overwhelmed at home and has no time for self care  Plan made to take her out with [facility] for 1 week  She will aggressively look for part time work in and outside of her company  >25 min spent with face to face with patient, >50% counseling and/or coordinating care

## 2012-03-17 NOTE — Patient Instructions (Addendum)
Stay out of work for 7 days for fibromyalgia flare- try to stretch and do some walking and eat healthy diet avoiding processed food and sugar  Let me know if no improvement

## 2012-03-17 NOTE — Progress Notes (Signed)
Subjective:    Patient ID: Lindsay Fry, female    DOB: 03-10-1960, 52 y.o.   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 Has had some accomadation from job from health problem She is on corrective action - until middle of the month  (due to inability to perform)- despite supervisor's effort to help  She is supposed to talk to a recruiter for [employer] for another job   She was moved into a dept against her will - and just unable to keep up   Does not think she could work 40 hours per week at any job due to fibromyalgia  Is exhausted - does not have the energy to take a shower even  Has felt flu like - and hurts all over - but no fever Ribs and back are especially bad  Sleeping well - probably over sleeping   Ultimate goal would be working part time mornings  Has seen therapist for this - in agreement   No longer on abilify- was too $$ Sees Dr Sharl Ma  Pretty stable overall   Patient Active Problem List  Diagnosis  . BENIGN NEOPLASM OTH&UNSPEC SITE DIGESTIVE SYSTEM  . HYPOTHYROIDISM  . UNSPECIFIED VITAMIN D DEFICIENCY  . HYPERCHOLESTEROLEMIA  . ANXIETY  . DEPRESSION  . HYPERTENSION  . ALLERGIC RHINITIS  . ASTHMA  . GERD  . HIATAL HERNIA  . COLITIS, ULCERATIVE  . ACNE ROSACEA  . FIBROMYALGIA  . INSOMNIA  . FATIGUE  . COLONIC POLYPS, ADENOMATOUS, HX OF  . MIGRAINES, HX OF  . POSTMENOPAUSAL STATUS  . Routine general medical examination at a health care facility  . Other screening mammogram  . Chronic sinusitis  . Vertigo, benign positional   Past Medical History  Diagnosis Date  . Rosacea   . Family history of malignant neoplasm  of gastrointestinal tract   . Allergic rhinitis, cause unspecified   . Anxiety state, unspecified   . Unspecified asthma   . Benign neoplasm of other and unspecified site of the digestive system   . Ulcerative colitis, unspecified   . Depression   . Fibromyalgia   . GERD (gastroesophageal reflux disease)   . Hiatal hernia   . Pure hypercholesterolemia   . Unspecified essential hypertension   . Unspecified hypothyroidism   . Insomnia, unspecified   . History of migraines   . Other screening mammogram   . Asymptomatic postmenopausal status (age-related) (natural)   . Unspecified vitamin D deficiency   . Edema   . Dysuria   . History of ovarian cyst   . Cyst     on Achilles tendon  . Tubular adenoma of colon 01/10/11   Past Surgical History  Procedure Laterality Date  . Wisdom tooth extraction  1980's  . Tonsillectomy  1984  . Cesarean section  1996/1997    placenta previa, gest DM, pre-eclampsia  . Nasal sinus surgery  05/1997  . Precancerous mole removed    . Cholecystectomy  1997    adhesions also  . Meckel diverticulum excision  1999  . Breast biopsy  on tamoxifen, atypical hyperplasia  . Dexa  04/1999 and 2010    normal  . Colonoscopy  01/2001    Ulcerative colitis  . Esophagogastroduodenoscopy  09/2001    polyp  . Exercise stress test  11/2003    negative  . Nuclear stress test  06/2007    negative  . Colonoscopy  04/2004    UC, polyp  . Colonoscopy  12/08    UC, no polyps  . Sleep study  11/09    no apnea, but did snore (done by [facility])   History  Substance Use Topics  . Smoking status: Former Smoker    Quit date: 01/14/1993  . Smokeless tobacco: Not on file  . Alcohol Use: No     Comment: Recovered ETOH   Family History  Problem Relation Age of Onset  . Coronary artery disease Father   . Colon cancer Father   . Alzheimer's disease Father   . Coronary artery disease Mother   . Hypertension Mother   . Osteoporosis Mother   . Colitis Mother    . Crohn's disease Mother   . Breast cancer      great aunts  . Coronary artery disease      Uncle (also AAA)  . Diabetes      remote family history  . Stroke Cousin   . Esophageal cancer Neg Hx   . Rectal cancer Neg Hx   . Stomach cancer Neg Hx    Allergies  Allergen Reactions  . Erythromycin     REACTION: GI upset  . Nsaids     REACTION: aggrivate colitis  . Rosuvastatin     REACTION: increased lfts   Current Outpatient Prescriptions on File Prior to Visit  Medication Sig Dispense Refill  . acetaminophen (TYLENOL ARTHRITIS PAIN) 650 MG CR tablet Take 1,300 mg by mouth every 8 (eight) hours as needed.        Marland Kitchen amitriptyline (ELAVIL) 10 MG tablet TAKE 5 TABLETS BY MOUTH EVERY NIGHT AT BEDTIME  150 tablet  11  . amLODipine (NORVASC) 10 MG tablet TAKE 1 TABLET BY MOUTH EVERY NIGHT AT BEDTIME  90 tablet  3  . ARIPiprazole (ABILIFY) 10 MG tablet Take 10 mg by mouth daily.        Marland Kitchen atorvastatin (LIPITOR) 40 MG tablet TAKE  1/2 TABLET BY MOUTH EVERY DAY  15 tablet  3  . bisacodyl (BISACODYL) 5 MG EC tablet Take 25 mg by mouth at bedtime.       Marland Kitchen buPROPion (WELLBUTRIN XL) 150 MG 24 hr tablet Take 450 mg by mouth daily.        . busPIRone (BUSPAR) 15 MG tablet Take 15 mg by mouth as directed. Take one at bedtime; may repeat once as needed for anxiety       . Calcium-Vitamin D-Vitamin K (VIACTIV) 500-100-40 MG-UNT-MCG CHEW Chew 2 tablets by mouth 2 (two) times daily.        . chlorproMAZINE (THORAZINE) 25 MG tablet Take 25-50 mg by mouth 3 (three) times daily. For migraines if frova doesn't help.       . Clindamycin Phosphate foam       . cyclobenzaprine (FLEXERIL) 10 MG tablet TAKE 2 TABLETS BY MOUTH AT BEDTIME  180 tablet  1  . dextroamphetamine (DEXEDRINE SPANSULE) 15 MG 24 hr capsule Take 15 mg by mouth 3 (three) times daily.       . diclofenac sodium (VOLTAREN) 1 % GEL Apply 1 application topically 4 (  four) times daily.        . fluticasone (FLONASE) 50 MCG/ACT nasal spray Place 2  sprays into the nose 2 (two) times daily.  16 g  11  . latanoprost (XALATAN) 0.005 % ophthalmic solution Place 1 drop into both eyes At bedtime.      Marland Kitchen levothyroxine (SYNTHROID, LEVOTHROID) 200 MCG tablet Take 224 mcg by mouth daily.       Marland Kitchen lisdexamfetamine (VYVANSE) 70 MG capsule Take 70 mg by mouth every morning.        . Multiple Vitamin (MULTIVITAMIN) tablet Take 1 tablet by mouth daily.        Marland Kitchen NARATRIPTAN HCL PO Take by mouth as directed, pt unsure of strength.      . nitrofurantoin (MACRODANTIN) 100 MG capsule Take 100 mg by mouth as directed. With sexual activity       . pantoprazole (PROTONIX) 40 MG tablet TAKE 1 TABLET BY MOUTH EVERY DAY  30 tablet  11  . simethicone (MYLICON) 125 MG chewable tablet Chew 125 mg by mouth 3 (three) times daily.        Marland Kitchen spironolactone (ALDACTONE) 25 MG tablet Take 25 mg by mouth 2 (two) times daily.        . traMADol (ULTRAM) 50 MG tablet TAKE ONE TABLET BY MOUTH EVERY MORNING , 1 TABLET AT LUNCH AND 2 TABLETS AT BEDTIME  120 tablet  5  . traZODone (DESYREL) 150 MG tablet Take 450 mg by mouth at bedtime.       . valACYclovir (VALTREX) 500 MG tablet Take 500 mg by mouth 2 (two) times daily.        Marland Kitchen venlafaxine (EFFEXOR) 75 MG tablet Take 75 mg by mouth 2 (two) times daily.         No current facility-administered medications on file prior to visit.    Review of Systems Review of Systems  Constitutional: Negative for fever, appetite change, and unexpected weight change. pos for chronic fatigue  Eyes: Negative for pain and visual disturbance.  Respiratory: Negative for cough and shortness of breath.   Cardiovascular: Negative for cp or palpitations    Gastrointestinal: Negative for nausea, and pos for IBS symptoms   Genitourinary: Negative for urgency and frequency. neg for dysuria  Skin: Negative for pallor or rash   MSK pos for joint and muscle pain / neg for joint swelling or redness  Neurological: Negative for weakness, light-headedness,  numbness and pos for headaches.  Hematological: Negative for adenopathy. Does not bruise/bleed easily.  Psychiatric/Behavioral: pos for depression and anxiety that are fairly well controlled         Objective:   Physical Exam  Constitutional: She appears well-developed and well-nourished. No distress.  obese and well appearing   HENT:  Head: Normocephalic and atraumatic.  Mouth/Throat: Oropharynx is clear and moist.  Eyes: Conjunctivae and EOM are normal. Pupils are equal, round, and reactive to light.  Neck: Normal range of motion. Neck supple. No thyromegaly present.  Cardiovascular: Normal rate, regular rhythm and intact distal pulses.  Exam reveals no gallop.   Pulmonary/Chest: Effort normal and breath sounds normal. No respiratory distress. She has no wheezes. She exhibits tenderness.  Abdominal: Soft. Bowel sounds are normal. She exhibits no distension. There is no tenderness.  Musculoskeletal: She exhibits tenderness. She exhibits no edema.  Myofascial trigger points along thoracic and lumbar para spinal musculature  Also anterior cw tenderness bilat  No joint swelling or redness  Lymphadenopathy:    She  has no cervical adenopathy.  Neurological: She is alert. She has normal reflexes. No cranial nerve deficit. She exhibits normal muscle tone. Coordination normal.  Skin: Skin is warm and dry. No rash noted. No erythema. No pallor.  Psychiatric: She has a normal mood and affect.  Pleasant and talkative - but seems very fatigued            Assessment & Plan:

## 2012-04-06 ENCOUNTER — Telehealth: Payer: Self-pay | Admitting: Family Medicine

## 2012-04-06 NOTE — Telephone Encounter (Signed)
Is in IN box with instructions- pt may want to review before you fax, however

## 2012-04-06 NOTE — Telephone Encounter (Signed)
Form placed in your inbox

## 2012-04-06 NOTE — Telephone Encounter (Signed)
Lindsay Fry dropped off a [facility] form to be filed out.

## 2012-04-07 NOTE — Telephone Encounter (Signed)
Paperwork faxed.  Left vm instructing pt that paperwork has been faxed and ready for pick up.

## 2012-04-08 ENCOUNTER — Encounter: Payer: Self-pay | Admitting: Internal Medicine

## 2012-04-08 ENCOUNTER — Ambulatory Visit (INDEPENDENT_AMBULATORY_CARE_PROVIDER_SITE_OTHER): Payer: 59 | Admitting: Internal Medicine

## 2012-04-08 VITALS — BP 132/80 | HR 112 | Ht 66.5 in | Wt 230.0 lb

## 2012-04-08 DIAGNOSIS — K5100 Ulcerative (chronic) pancolitis without complications: Secondary | ICD-10-CM

## 2012-04-08 DIAGNOSIS — K219 Gastro-esophageal reflux disease without esophagitis: Secondary | ICD-10-CM

## 2012-04-08 MED ORDER — PANTOPRAZOLE SODIUM 40 MG PO TBEC: 40.0000 mg | DELAYED_RELEASE_TABLET | Freq: Two times a day (BID) | ORAL | Status: DC

## 2012-04-08 NOTE — Progress Notes (Signed)
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 She is taking an active leave of absence as part of FMLA leave until May 2014. She has had ulcerative colitis since 1988 and is up-to-date on her colonoscopy which was in December 2012. She had patchy colitis on random biopsies. She also had a tubular adenoma and will be due for a recall colonoscopy in December 2015. An upper endoscopy in September 2003 showed a small hiatal hernia and bile reflux. She had a prior cholecystectomy. She is on multiple medications including Protonix 40 mg every day. She has depression and ADHD  for which she takes BuSpar, Wellbutrin, Dexedrine and Elavil.   Past Medical History  Diagnosis Date  . Rosacea   . Family history of malignant neoplasm of gastrointestinal tract   . Allergic rhinitis, cause unspecified   . Anxiety state, unspecified   . Unspecified asthma   . Benign neoplasm of other and unspecified site of the digestive system   . Ulcerative colitis, unspecified   . Depression   . Fibromyalgia   . GERD (gastroesophageal reflux disease)   . Hiatal hernia   . Pure hypercholesterolemia   . Unspecified essential hypertension   . Unspecified hypothyroidism   . Insomnia, unspecified   . History of migraines   . Other screening mammogram   . Asymptomatic postmenopausal status (age-related) (natural)   . Unspecified vitamin D deficiency   . Edema   . Dysuria   . History of ovarian cyst   . Cyst     on Achilles tendon  . Tubular adenoma of colon 01/10/11   Past Surgical History  Procedure Laterality Date  . Wisdom tooth extraction  1980's  . Tonsillectomy  1984  . Cesarean section  1996/1997    placenta previa, gest DM, pre-eclampsia  . Nasal sinus surgery   05/1997  . Precancerous mole removed    . Cholecystectomy  1997    adhesions also  . Meckel diverticulum excision  1999  . Breast biopsy      on tamoxifen, atypical hyperplasia  . Dexa  04/1999 and 2010    normal  . Colonoscopy  01/2001    Ulcerative colitis  . Esophagogastroduodenoscopy  09/2001    polyp  . Exercise stress test  11/2003    negative  . Nuclear stress test  06/2007    negative  . Colonoscopy  04/2004    UC, polyp  . Colonoscopy  12/08    UC, no polyps  . Sleep study  11/09    no apnea, but did snore (done by [facility])    reports that she quit smoking about 19 years ago. She has never used smokeless tobacco. She reports that she does not drink alcohol or use illicit drugs. family history includes Alzheimer's disease in her father; Breast cancer in an unspecified family member; Colitis in her mother; Colon cancer in her father; Coronary artery disease in her father, mother, and unspecified family member; Crohn's disease in her mother; Diabetes in an unspecified family member; Hypertension in her mother; Osteoporosis in her mother; and Stroke in her cousin.  There is no history of Esophageal cancer, and Rectal cancer, and Stomach cancer, . Allergies  Allergen Reactions  . Erythromycin     REACTION: GI upset  .  Nsaids     REACTION: aggrivate colitis  . Rosuvastatin     REACTION: increased lfts        Review of Systems: Positive for heartburn negative for dysphagia. Positive for nocturnal cough and nocturnal regurgitation of the food  The remainder of the 10 point ROS is negative except as outlined in H&P   Physical Exam: General appearance  Well developed, in no distress. Eyes- non icteric. HEENT nontraumatic, normocephalic. Mouth no lesions, tongue papillated, no cheilosis. Neck supple without adenopathy, thyroid not enlarged, no carotid bruits, no JVD. Lungs Clear to auscultation bilaterally. Cor normal S1, normal S2, regular rhythm, no murmur,  quiet  precordium. Abdomen: Large obese abdomen, normal active bowel sounds, no tenderness, liver edge at costal margin. No ascites. Rectal soft Hemoccult negative stool. Extremities no pedal edema. Skin no lesions. Neurological alert and oriented x 3. Psychological normal mood and affect.  Assessment and Plan:  Problem #68 52 year old white female with recent exacerbation of chronic gastroesophageal reflux as a result of stress. She is on multiple psychotropic medications which may be contributing to her GI hypo motility and increased reflux. She may have a possible delay in gastric emptying due to multiple medications. She has a known hiatal hernia from her prior endoscopy in 2003. We will increase her Protonix to 40 mg by mouth twice a day and I have instructed her in strict antireflux measures. I have advised weight loss .Marland Kitchen If the symptoms don't improve in the next 4-6 weeks, I would consider an upper endoscopy with biopsies to rule out Barrett's esophagus.  Problem #2 Ulcerative colitis of 25 years duration. A recall colonoscopy will be due in December 2015. Patient is currently on no medications for ulcerative colitis.   04/08/2012 Lindsay Fry

## 2012-04-08 NOTE — Patient Instructions (Addendum)
We have given you a prescription of the following for you to pick up at your convenience: Protonix 40 mg twice daily  CC: Dr Roxy Manns

## 2012-05-14 ENCOUNTER — Other Ambulatory Visit: Payer: Self-pay | Admitting: Family Medicine

## 2012-05-15 NOTE — Telephone Encounter (Signed)
Will refill electronically  

## 2012-05-15 NOTE — Telephone Encounter (Signed)
Ok to refill 

## 2012-05-18 ENCOUNTER — Ambulatory Visit (INDEPENDENT_AMBULATORY_CARE_PROVIDER_SITE_OTHER): Payer: 59 | Admitting: Family Medicine

## 2012-05-18 ENCOUNTER — Encounter: Payer: Self-pay | Admitting: Family Medicine

## 2012-05-18 VITALS — BP 130/74 | HR 108 | Temp 98.7°F | Ht 65.5 in | Wt 226.8 lb

## 2012-05-18 DIAGNOSIS — E890 Postprocedural hypothyroidism: Secondary | ICD-10-CM | POA: Insufficient documentation

## 2012-05-18 DIAGNOSIS — E039 Hypothyroidism, unspecified: Secondary | ICD-10-CM | POA: Insufficient documentation

## 2012-05-18 DIAGNOSIS — Z0000 Encounter for general adult medical examination without abnormal findings: Secondary | ICD-10-CM

## 2012-05-18 DIAGNOSIS — I10 Essential (primary) hypertension: Secondary | ICD-10-CM

## 2012-05-18 DIAGNOSIS — E559 Vitamin D deficiency, unspecified: Secondary | ICD-10-CM

## 2012-05-18 DIAGNOSIS — E7800 Pure hypercholesterolemia, unspecified: Secondary | ICD-10-CM

## 2012-05-18 MED ORDER — FLUTICASONE PROPIONATE 50 MCG/ACT NA SUSP: 2.0000 | Freq: Two times a day (BID) | NASAL | Status: DC

## 2012-05-18 NOTE — Assessment & Plan Note (Signed)
bp in fair control at this time  No changes needed  Disc lifstyle change with low sodium diet and exercise  Fasting labs will be tomorrow

## 2012-05-18 NOTE — Assessment & Plan Note (Signed)
Reviewed health habits including diet and exercise and skin cancer prevention Also reviewed health mt list, fam hx and immunizations  Wellness labs tomorrow Finally has some time to care for herself

## 2012-05-18 NOTE — Assessment & Plan Note (Signed)
On lipitor and diet Rev low sat fat diet Labs scheduled for tomorrow

## 2012-05-18 NOTE — Progress Notes (Signed)
Subjective:    Patient ID: Lindsay Fry, female    DOB: 1960-11-25, 52 y.o.   MRN: [id number]  HPI Here for health maintenance exam and to review chronic medical problems    Lots of stress- her mother is in hosp with chf / dementia/ falls/ anxiety- is very difficult to take care of herself She will end up needing home care  She is dealing with it ok overall   Wt is down 4 lb with bmi of 37 Appetite is down - she is appreciative of that  Making her own meals - and trying to choose better things  Has joined [employer] - plans to start soon , working on schedule so she can go  Flu vaccine 10/13 Gyn exam - was with Dr Luella Cook 01/30/12 with mammo also -normal  Self breast exam -normal with no lumps or changes  colonosc 12/12- polyp/adenoma- 5 year recall -no stool changes   Td 11/12  dexa 8/10 normal   Needs labs- schedule tomorrow am   Hypothyroid Lab Results  Component Value Date   TSH 1.64 04/23/2011  she saw Dr Lucianne Muss recently - hyperglycemia - and last A1c was 5.7  No longer on abilify  bp is stable today  No cp or palpitations or headaches or edema  No side effects to medicines  BP Readings from Last 3 Encounters:  05/18/12 130/74  04/08/12 132/80  03/17/12 104/62     Patient Active Problem List   Diagnosis Date Noted  . Unspecified hypothyroidism 05/18/2012  . Chronic sinusitis 01/24/2012  . Vertigo, benign positional 01/24/2012  . Other screening mammogram 12/05/2010  . Routine general medical examination at a health care facility 10/14/2010  . FATIGUE 08/09/2009  . POSTMENOPAUSAL STATUS 06/22/2008  . UNSPECIFIED VITAMIN D DEFICIENCY 03/18/2008  . BENIGN NEOPLASM OTH&UNSPEC SITE DIGESTIVE SYSTEM 01/09/2007  . HIATAL HERNIA 01/09/2007  . COLONIC POLYPS, ADENOMATOUS, HX OF 01/09/2007  . HYPERCHOLESTEROLEMIA 01/07/2007  . ANXIETY 01/07/2007  . DEPRESSION 01/07/2007  . HYPERTENSION 01/07/2007  . ALLERGIC RHINITIS 01/07/2007  . ASTHMA 01/07/2007  .  GERD 01/07/2007  . COLITIS, ULCERATIVE 01/07/2007  . ACNE ROSACEA 01/07/2007  . MIGRAINES, HX OF 01/07/2007  . FIBROMYALGIA 10/30/2006  . INSOMNIA 10/30/2006   Past Medical History  Diagnosis Date  . Rosacea   . Family history of malignant neoplasm of gastrointestinal tract   . Allergic rhinitis, cause unspecified   . Anxiety state, unspecified   . Unspecified asthma   . Benign neoplasm of other and unspecified site of the digestive system   . Ulcerative colitis, unspecified   . Depression   . Fibromyalgia   . GERD (gastroesophageal reflux disease)   . Hiatal hernia   . Pure hypercholesterolemia   . Unspecified essential hypertension   . Unspecified hypothyroidism   . Insomnia, unspecified   . History of migraines   . Other screening mammogram   . Asymptomatic postmenopausal status (age-related) (natural)   . Unspecified vitamin D deficiency   . Edema   . Dysuria   . History of ovarian cyst   . Cyst     on Achilles tendon  . Tubular adenoma of colon 01/10/11   Past Surgical History  Procedure Laterality Date  . Wisdom tooth extraction  1980's  . Tonsillectomy  1984  . Cesarean section  1996/1997    placenta previa, gest DM, pre-eclampsia  . Nasal sinus surgery  05/1997  . Precancerous mole removed    . Cholecystectomy  1997  adhesions also  . Meckel diverticulum excision  1999  . Breast biopsy      on tamoxifen, atypical hyperplasia  . Dexa  04/1999 and 2010    normal  . Colonoscopy  01/2001    Ulcerative colitis  . Esophagogastroduodenoscopy  09/2001    polyp  . Exercise stress test  11/2003    negative  . Nuclear stress test  06/2007    negative  . Colonoscopy  04/2004    UC, polyp  . Colonoscopy  12/08    UC, no polyps  . Sleep study  11/09    no apnea, but did snore (done by [facility])   History  Substance Use Topics  . Smoking status: Former Smoker    Quit date: 01/14/1993  . Smokeless tobacco: Never Used  . Alcohol Use: No     Comment:  Recovered ETOH   Family History  Problem Relation Age of Onset  . Coronary artery disease Father   . Colon cancer Father   . Alzheimer's disease Father   . Coronary artery disease Mother   . Hypertension Mother   . Osteoporosis Mother   . Colitis Mother   . Crohn's disease Mother   . Breast cancer      great aunts  . Coronary artery disease      Uncle (also AAA)  . Diabetes      remote family history  . Stroke Cousin   . Esophageal cancer Neg Hx   . Rectal cancer Neg Hx   . Stomach cancer Neg Hx    Allergies  Allergen Reactions  . Erythromycin     REACTION: GI upset  . Nsaids     REACTION: aggrivate colitis  . Rosuvastatin     REACTION: increased lfts   Current Outpatient Prescriptions on File Prior to Visit  Medication Sig Dispense Refill  . acetaminophen (TYLENOL ARTHRITIS PAIN) 650 MG CR tablet Take 1,300 mg by mouth every 8 (eight) hours as needed.        Marland Kitchen amitriptyline (ELAVIL) 10 MG tablet TAKE 5 TABLETS BY MOUTH EVERY NIGHT AT BEDTIME  150 tablet  11  . amLODipine (NORVASC) 10 MG tablet TAKE 1 TABLET BY MOUTH EVERY NIGHT AT BEDTIME  90 tablet  3  . atorvastatin (LIPITOR) 40 MG tablet TAKE  1/2 TABLET BY MOUTH EVERY DAY  15 tablet  3  . bisacodyl (BISACODYL) 5 MG EC tablet Take 25 mg by mouth at bedtime as needed.       Marland Kitchen buPROPion (WELLBUTRIN XL) 150 MG 24 hr tablet Take 450 mg by mouth daily.        . busPIRone (BUSPAR) 15 MG tablet Take 15 mg by mouth as directed. Take one at bedtime; may repeat once as needed for anxiety       . Calcium-Vitamin D-Vitamin K (VIACTIV) 500-100-40 MG-UNT-MCG CHEW Chew 2 tablets by mouth 2 (two) times daily.        . chlorproMAZINE (THORAZINE) 25 MG tablet Take 25-50 mg by mouth 3 (three) times daily. For migraines if frova doesn't help.       . Clindamycin Phosphate foam       . cyclobenzaprine (FLEXERIL) 10 MG tablet TAKE 2 TABLETS BY MOUTH AT BEDTIME  180 tablet  1  . dextroamphetamine (DEXEDRINE SPANSULE) 15 MG 24 hr capsule  Take 15 mg by mouth 2 (two) times daily.       . diclofenac sodium (VOLTAREN) 1 % GEL Apply 1 application topically  4 (four) times daily.        . fluticasone (FLONASE) 50 MCG/ACT nasal spray Place 2 sprays into the nose 2 (two) times daily.  16 g  11  . latanoprost (XALATAN) 0.005 % ophthalmic solution Place 1 drop into both eyes At bedtime.      Marland Kitchen lisdexamfetamine (VYVANSE) 70 MG capsule Take 70 mg by mouth every morning.        . Multiple Vitamin (MULTIVITAMIN) tablet Take 1 tablet by mouth daily.        Marland Kitchen NARATRIPTAN HCL PO Take by mouth as directed, pt unsure of strength.      . nitrofurantoin (MACRODANTIN) 100 MG capsule Take 100 mg by mouth as directed. With sexual activity       . pantoprazole (PROTONIX) 40 MG tablet Take 1 tablet (40 mg total) by mouth 2 (two) times daily.  60 tablet  5  . simethicone (MYLICON) 125 MG chewable tablet Chew 125 mg by mouth 3 (three) times daily.        Marland Kitchen spironolactone (ALDACTONE) 25 MG tablet Take 25 mg by mouth 2 (two) times daily.        . traMADol (ULTRAM) 50 MG tablet TAKE 1 TABLET BY MOUTH EVERY MORNING , THEN 1 AT LUNCH AND 2 TABLETS AT BEDTIME  120 tablet  5  . traZODone (DESYREL) 150 MG tablet Take 450 mg by mouth at bedtime.       . valACYclovir (VALTREX) 500 MG tablet Take 500 mg by mouth 2 (two) times daily.        Marland Kitchen venlafaxine (EFFEXOR) 75 MG tablet Take 75 mg by mouth 2 (two) times daily.         No current facility-administered medications on file prior to visit.     Review of Systems Review of Systems  Constitutional: Negative for fever, appetite change, and unexpected weight change. pos for chronic fatigue Eyes: Negative for pain and visual disturbance.  Respiratory: Negative for cough and shortness of breath.   Cardiovascular: Negative for cp or palpitations    Gastrointestinal: Negative for nausea, diarrhea and constipation.  Genitourinary: Negative for urgency and frequency.  Skin: Negative for pallor or rash   MSK pos for  myofascial pain , neg for joint swelling  Neurological: Negative for weakness, light-headedness, numbness and headaches.  Hematological: Negative for adenopathy. Does not bruise/bleed easily.  Psychiatric/Behavioral: Negative for dysphoric mood. The patient is not nervous/anxious.         Objective:   Physical Exam  Constitutional: She appears well-developed and well-nourished. No distress.  obese and well appearing   HENT:  Head: Normocephalic and atraumatic.  Right Ear: External ear normal.  Left Ear: External ear normal.  Nose: Nose normal.  Mouth/Throat: Oropharynx is clear and moist.  Nares are boggy  Eyes: Conjunctivae and EOM are normal. Pupils are equal, round, and reactive to light. Right eye exhibits no discharge. Left eye exhibits no discharge. No scleral icterus.  Neck: Normal range of motion. Neck supple. No JVD present. Carotid bruit is not present. No thyromegaly present.  Cardiovascular: Normal rate, regular rhythm, normal heart sounds and intact distal pulses.  Exam reveals no gallop.   Pulmonary/Chest: Breath sounds normal. No respiratory distress. She has no wheezes.  Abdominal: Soft. Bowel sounds are normal. She exhibits no distension, no abdominal bruit and no mass. There is no tenderness.  Musculoskeletal: She exhibits no edema and no tenderness.  Lymphadenopathy:    She has no cervical adenopathy.  Neurological:  She is alert. She has normal reflexes. No cranial nerve deficit. She exhibits normal muscle tone. Coordination normal.  Skin: Skin is warm and dry. No rash noted. No erythema. No pallor.  Solar lentigos diffusely   Psychiatric: She has a normal mood and affect.  Cheerful and talkative today          Assessment & Plan:

## 2012-05-18 NOTE — Assessment & Plan Note (Signed)
Will check D level tomorrow Taking suppl No fx  Nl dexa 2010

## 2012-05-18 NOTE — Patient Instructions (Addendum)
I'm thrilled you are starting to take care of yourself I ordered fasting labs for tomorrow  Work towards 5 days of exercise per week eventually  Also keep choosing healthy foods and working on weight loss Follow up in 6 months

## 2012-05-19 ENCOUNTER — Other Ambulatory Visit (INDEPENDENT_AMBULATORY_CARE_PROVIDER_SITE_OTHER): Payer: 59

## 2012-05-19 DIAGNOSIS — E559 Vitamin D deficiency, unspecified: Secondary | ICD-10-CM

## 2012-05-19 DIAGNOSIS — Z0000 Encounter for general adult medical examination without abnormal findings: Secondary | ICD-10-CM

## 2012-05-19 LAB — COMPREHENSIVE METABOLIC PANEL
ALT: 46 U/L — ABNORMAL HIGH (ref 0–35)
AST: 39 U/L — ABNORMAL HIGH (ref 0–37)
Albumin: 4.3 g/dL (ref 3.5–5.2)
Alkaline Phosphatase: 71 U/L (ref 39–117)
BUN: 12 mg/dL (ref 6–23)
CO2: 26 mEq/L (ref 19–32)
Calcium: 9.3 mg/dL (ref 8.4–10.5)
Chloride: 104 mEq/L (ref 96–112)
Creatinine, Ser: 0.9 mg/dL (ref 0.4–1.2)
GFR: 66 mL/min (ref >60.00)
Glucose, Bld: 128 mg/dL — ABNORMAL HIGH (ref 70–99)
Potassium: 3.8 mEq/L (ref 3.5–5.1)
Sodium: 138 mEq/L (ref 135–145)
Total Bilirubin: 0.7 mg/dL (ref 0.3–1.2)
Total Protein: 7.3 g/dL (ref 6.0–8.3)

## 2012-05-19 LAB — CBC WITH DIFFERENTIAL/PLATELET
Basophils Absolute: 0 10*3/uL (ref 0.0–0.1)
Basophils Relative: 0.5 % (ref 0.0–3.0)
Eosinophils Absolute: 0.4 10*3/uL (ref 0.0–0.7)
Eosinophils Relative: 3.9 % (ref 0.0–5.0)
HCT: 43.3 % (ref 36.0–46.0)
Hemoglobin: 14.8 g/dL (ref 12.0–15.0)
Lymphocytes Relative: 26.6 % (ref 12.0–46.0)
Lymphs Abs: 2.4 10*3/uL (ref 0.7–4.0)
MCHC: 34.2 g/dL (ref 30.0–36.0)
MCV: 89.8 fl (ref 78.0–100.0)
Monocytes Absolute: 0.7 10*3/uL (ref 0.1–1.0)
Monocytes Relative: 8.2 % (ref 3.0–12.0)
Neutro Abs: 5.5 10*3/uL (ref 1.4–7.7)
Neutrophils Relative %: 60.8 % (ref 43.0–77.0)
Platelets: 421 10*3/uL — ABNORMAL HIGH (ref 150.0–400.0)
RBC: 4.82 Mil/uL (ref 3.87–5.11)
RDW: 13.9 % (ref 11.5–14.6)
WBC: 9.1 10*3/uL (ref 4.5–10.5)

## 2012-05-19 LAB — LIPID PANEL
Cholesterol: 174 mg/dL (ref 0–200)
HDL: 48.8 mg/dL (ref >39.00)
LDL Cholesterol: 95 mg/dL (ref 0–99)
Total CHOL/HDL Ratio: 4
Triglycerides: 153 mg/dL — ABNORMAL HIGH (ref 0.0–149.0)
VLDL: 30.6 mg/dL (ref 0.0–40.0)

## 2012-05-20 ENCOUNTER — Ambulatory Visit: Payer: 59

## 2012-05-20 DIAGNOSIS — R7309 Other abnormal glucose: Secondary | ICD-10-CM

## 2012-05-20 LAB — HEMOGLOBIN A1C: Hgb A1c MFr Bld: 6.1 % (ref 4.6–6.5)

## 2012-05-20 LAB — VITAMIN D 25 HYDROXY (VIT D DEFICIENCY, FRACTURES): Vit D, 25-Hydroxy: 47 ng/mL (ref 30–89)

## 2012-05-21 ENCOUNTER — Encounter: Payer: Self-pay | Admitting: *Deleted

## 2012-06-12 ENCOUNTER — Other Ambulatory Visit: Payer: Self-pay | Admitting: Family Medicine

## 2012-06-12 NOTE — Telephone Encounter (Signed)
Electronic refill request, Dr. Milinda Antis not in office, please advise

## 2012-06-15 ENCOUNTER — Telehealth: Payer: Self-pay | Admitting: *Deleted

## 2012-06-15 NOTE — Telephone Encounter (Signed)
Via fax from [facility] is not available, requesting substitute. Called [facility] in [place] and per pharmacist they are out as well. Per pharmacist all [facility] are out.  I called patient and left message. Seeing what else patient has tried and what we can send in place of Pantoprazole. Waiting on patient to call back.

## 2012-08-05 ENCOUNTER — Ambulatory Visit (INDEPENDENT_AMBULATORY_CARE_PROVIDER_SITE_OTHER): Payer: BC Managed Care – PPO | Admitting: Family Medicine

## 2012-08-05 ENCOUNTER — Ambulatory Visit: Payer: BC Managed Care – PPO | Admitting: Family Medicine

## 2012-08-05 ENCOUNTER — Ambulatory Visit (INDEPENDENT_AMBULATORY_CARE_PROVIDER_SITE_OTHER)
Admission: RE | Admit: 2012-08-05 | Discharge: 2012-08-05 | Disposition: A | Discharge status: Home / Self Care | Payer: BC Managed Care – PPO | Source: Ambulatory Visit | Attending: Family Medicine | Admitting: Family Medicine

## 2012-08-05 ENCOUNTER — Encounter: Payer: Self-pay | Admitting: Family Medicine

## 2012-08-05 VITALS — BP 110/62 | HR 112 | Temp 98.4°F | Ht 65.5 in | Wt 223.2 lb

## 2012-08-05 DIAGNOSIS — M545 Low back pain, unspecified: Secondary | ICD-10-CM

## 2012-08-05 DIAGNOSIS — R39198 Other difficulties with micturition: Secondary | ICD-10-CM

## 2012-08-05 DIAGNOSIS — R3989 Other symptoms and signs involving the genitourinary system: Secondary | ICD-10-CM

## 2012-08-05 DIAGNOSIS — R079 Chest pain, unspecified: Secondary | ICD-10-CM | POA: Insufficient documentation

## 2012-08-05 DIAGNOSIS — R350 Frequency of micturition: Secondary | ICD-10-CM | POA: Insufficient documentation

## 2012-08-05 LAB — POCT URINALYSIS DIPSTICK
Bilirubin, UA: NEGATIVE
Glucose, UA: NEGATIVE
Ketones, UA: NEGATIVE
Leukocytes, UA: NEGATIVE
Nitrite, UA: NEGATIVE
Protein, UA: NEGATIVE
Spec Grav, UA: <=1.005
Urobilinogen, UA: 0.2
pH, UA: 6

## 2012-08-05 MED ORDER — HYDROCODONE-ACETAMINOPHEN 5-325 MG PO TABS: 1.0000 | ORAL_TABLET | Freq: Four times a day (QID) | ORAL | Status: DC | PRN

## 2012-08-05 NOTE — Progress Notes (Signed)
Subjective:    Patient ID: Lindsay Fry, female    DOB: 1960-05-18, 52 y.o.   MRN: [id number]  HPI Here with back pain - L flank to lower buttock Since yesterday - pain is as bad as 8/10 level  Much worse when she sits up straight  Is ok lying down - or walking  Changing position is difficult in bed however  Hurts to lift anything or bend over   No particular trauma   Last few weeks -not emptying bladder as well  No incontinence  Only happening for a week  No burning to urinate  Has not seen blood in urine    Never had kidney stones but they run in the family   Her endurance is less - not in shape  She gets sob more easily  She went to a water park - and got a little pressure in her chest - on exertion - perhaps 10 minutes  Not riding rides-- just sitting by the pool  Thinks she may need a stress test   EKG today looks unchanged from previous (has some flattened T waves in lateral leads) No cp or sob presently  Patient Active Problem List   Diagnosis Date Noted  . Chest pain 08/05/2012  . Urinary frequency 08/05/2012  . Low back pain 08/05/2012  . Unspecified hypothyroidism 05/18/2012  . Chronic sinusitis 01/24/2012  . Vertigo, benign positional 01/24/2012  . Other screening mammogram 12/05/2010  . Routine general medical examination at a health care facility 10/14/2010  . FATIGUE 08/09/2009  . POSTMENOPAUSAL STATUS 06/22/2008  . UNSPECIFIED VITAMIN D DEFICIENCY 03/18/2008  . BENIGN NEOPLASM OTH&UNSPEC SITE DIGESTIVE SYSTEM 01/09/2007  . HIATAL HERNIA 01/09/2007  . COLONIC POLYPS, ADENOMATOUS, HX OF 01/09/2007  . HYPERCHOLESTEROLEMIA 01/07/2007  . ANXIETY 01/07/2007  . DEPRESSION 01/07/2007  . HYPERTENSION 01/07/2007  . ALLERGIC RHINITIS 01/07/2007  . ASTHMA 01/07/2007  . GERD 01/07/2007  . COLITIS, ULCERATIVE 01/07/2007  . ACNE ROSACEA 01/07/2007  . MIGRAINES, HX OF 01/07/2007  . FIBROMYALGIA 10/30/2006  . INSOMNIA 10/30/2006   Past Medical History   Diagnosis Date  . Rosacea   . Family history of malignant neoplasm of gastrointestinal tract   . Allergic rhinitis, cause unspecified   . Anxiety state, unspecified   . Unspecified asthma(493.90)   . Benign neoplasm of other and unspecified site of the digestive system   . Ulcerative colitis, unspecified   . Depression   . Fibromyalgia   . GERD (gastroesophageal reflux disease)   . Hiatal hernia   . Pure hypercholesterolemia   . Unspecified essential hypertension   . Unspecified hypothyroidism   . Insomnia, unspecified   . History of migraines   . Other screening mammogram   . Asymptomatic postmenopausal status (age-related) (natural)   . Unspecified vitamin D deficiency   . Edema   . Dysuria   . History of ovarian cyst   . Cyst     on Achilles tendon  . Tubular adenoma of colon 01/10/11   Past Surgical History  Procedure Laterality Date  . Wisdom tooth extraction  1980's  . Tonsillectomy  1984  . Cesarean section  1996/1997    placenta previa, gest DM, pre-eclampsia  . Nasal sinus surgery  05/1997  . Precancerous mole removed    . Cholecystectomy  1997    adhesions also  . Meckel diverticulum excision  1999  . Breast biopsy      on tamoxifen, atypical hyperplasia  . Dexa  04/1999 and 2010  normal  . Colonoscopy  01/2001    Ulcerative colitis  . Esophagogastroduodenoscopy  09/2001    polyp  . Exercise stress test  11/2003    negative  . Nuclear stress test  06/2007    negative  . Colonoscopy  04/2004    UC, polyp  . Colonoscopy  12/08    UC, no polyps  . Sleep study  11/09    no apnea, but did snore (done by [facility])   History  Substance Use Topics  . Smoking status: Former Smoker    Quit date: 01/14/1993  . Smokeless tobacco: Never Used  . Alcohol Use: No     Comment: Recovered ETOH   Family History  Problem Relation Age of Onset  . Coronary artery disease Father   . Colon cancer Father   . Alzheimer's disease Father   . Coronary artery disease  Mother   . Hypertension Mother   . Osteoporosis Mother   . Colitis Mother   . Crohn's disease Mother   . Breast cancer      great aunts  . Coronary artery disease      Uncle (also AAA)  . Diabetes      remote family history  . Stroke Cousin   . Esophageal cancer Neg Hx   . Rectal cancer Neg Hx   . Stomach cancer Neg Hx    Allergies  Allergen Reactions  . Erythromycin     REACTION: GI upset  . Nsaids     REACTION: aggrivate colitis  . Rosuvastatin     REACTION: increased lfts   Current Outpatient Prescriptions on File Prior to Visit  Medication Sig Dispense Refill  . acetaminophen (TYLENOL ARTHRITIS PAIN) 650 MG CR tablet Take 1,300 mg by mouth every 8 (eight) hours as needed.        Marland Kitchen amitriptyline (ELAVIL) 10 MG tablet TAKE 5 TABLETS BY MOUTH EVERY NIGHT AT BEDTIME  150 tablet  11  . amLODipine (NORVASC) 10 MG tablet TAKE 1 TABLET BY MOUTH EVERY NIGHT AT BEDTIME  90 tablet  3  . atorvastatin (LIPITOR) 40 MG tablet TAKE  1/2 TABLET BY MOUTH EVERY DAY  15 tablet  3  . bisacodyl (BISACODYL) 5 MG EC tablet Take 5-10 mg by mouth at bedtime as needed.       Marland Kitchen buPROPion (WELLBUTRIN XL) 150 MG 24 hr tablet Take 450 mg by mouth daily.        . busPIRone (BUSPAR) 15 MG tablet Take 15 mg by mouth 4 (four) times daily. Take one at bedtime; may repeat once as needed for anxiety      . Calcium-Vitamin D-Vitamin K (VIACTIV) 500-100-40 MG-UNT-MCG CHEW Chew 2 tablets by mouth 2 (two) times daily.        . chlorproMAZINE (THORAZINE) 25 MG tablet Take 25-50 mg by mouth 3 (three) times daily. For migraines if frova doesn't help.       . Clindamycin Phosphate foam Apply topically.       . cyclobenzaprine (FLEXERIL) 10 MG tablet TAKE 2 TABLETS BY MOUTH AT BEDTIME  180 tablet  0  . dextroamphetamine (DEXEDRINE SPANSULE) 15 MG 24 hr capsule Take 15 mg by mouth. QD to BID      . fluticasone (FLONASE) 50 MCG/ACT nasal spray Place 2 sprays into the nose 2 (two) times daily.  16 g  11  . latanoprost  (XALATAN) 0.005 % ophthalmic solution Place 1 drop into both eyes At bedtime.      Marland Kitchen  levothyroxine (SYNTHROID, LEVOTHROID) 112 MCG tablet Take 224 mcg by mouth daily.       . Multiple Vitamin (MULTIVITAMIN) tablet Take 1 tablet by mouth daily.        Marland Kitchen NARATRIPTAN HCL PO Take by mouth as directed, pt unsure of strength.      . nitrofurantoin (MACRODANTIN) 100 MG capsule Take 100 mg by mouth as directed. With sexual activity       . pantoprazole (PROTONIX) 40 MG tablet Take 1 tablet (40 mg total) by mouth 2 (two) times daily.  60 tablet  5  . spironolactone (ALDACTONE) 25 MG tablet Take 25 mg by mouth 2 (two) times daily.        . traMADol (ULTRAM) 50 MG tablet TAKE 1 TABLET BY MOUTH EVERY MORNING , THEN 1 AT LUNCH AND 2 TABLETS AT BEDTIME  120 tablet  5  . traZODone (DESYREL) 150 MG tablet Take 450 mg by mouth at bedtime.       . valACYclovir (VALTREX) 500 MG tablet Take 500 mg by mouth 2 (two) times daily.        Marland Kitchen venlafaxine (EFFEXOR) 75 MG tablet Take 75 mg by mouth 2 (two) times daily.         No current facility-administered medications on file prior to visit.    Review of Systems Review of Systems  Constitutional: Negative for fever, appetite change, and unexpected weight change. pos for chronic fatigue  Eyes: Negative for pain and visual disturbance.  Respiratory: Negative for cough and pos for sob on exertion Cardiovascular: Negative for palpitations   neg for PND or orthopnea or pedal edema  Gastrointestinal: Negative for nausea, diarrhea and constipation.  Genitourinary: pos for some frequency/ neg for dysuria or blood in urine   Skin: Negative for pallor or rash   MSK pos for low back pain on L side without rad to leg  Neurological: Negative for weakness, light-headedness, numbness and headaches.  Hematological: Negative for adenopathy. Does not bruise/bleed easily.  Psychiatric/Behavioral: Negative for dysphoric mood. The patient is not nervous/anxious.         Objective:    Physical Exam  Constitutional: She appears well-developed and well-nourished. No distress.  obese and well appearing  She is uncomfortable with movement and position change  HENT:  Head: Normocephalic and atraumatic.  Eyes: Conjunctivae and EOM are normal. Pupils are equal, round, and reactive to light. No scleral icterus.  Neck: Normal range of motion. Neck supple. No JVD present. Carotid bruit is not present. No thyromegaly present.  Cardiovascular: Normal rate, regular rhythm, normal heart sounds and intact distal pulses.  Exam reveals no gallop.   No murmur heard. Pulmonary/Chest: Effort normal and breath sounds normal. No respiratory distress. She has no wheezes. She has no rales.  No crackles   Abdominal: Soft. Bowel sounds are normal. She exhibits no distension, no abdominal bruit and no mass. There is no tenderness.  No suprapubic tenderness or fullness    Musculoskeletal: She exhibits tenderness. She exhibits no edema.       Lumbar back: She exhibits decreased range of motion, tenderness, pain and spasm. She exhibits no bony tenderness, no swelling, no edema and no deformity.  Tender in L buttock/ SI area (not over spine)  No troch tenderness Pain to sit from lying down and to extend spine  Pain to lie flat- she needs to be on her side  Movement is slow and guarded Neg SLR No neuro changes  No CVA tenderness  Lymphadenopathy:    She has no cervical adenopathy.  Neurological: She is alert. She has normal strength and normal reflexes. She displays no atrophy. No sensory deficit. She exhibits normal muscle tone. Coordination normal.  Skin: Skin is warm and dry. No rash noted. No erythema. No pallor.  Psychiatric: She has a normal mood and affect.          Assessment & Plan:

## 2012-08-05 NOTE — Patient Instructions (Addendum)
Spine xray now - will update you  And we will culture urine Keep drinking your water  Try norco and do not mix with tramadol - and use caution with it  EKG is reassuring  Follow up with me in about a week

## 2012-08-06 LAB — POCT UA - MICROSCOPIC ONLY
Bacteria, U Microscopic: 0
Casts, Ur, LPF, POC: 0
Crystals, Ur, HPF, POC: 0
RBC, urine, microscopic: 0
WBC, Ur, HPF, POC: 0

## 2012-08-06 NOTE — Assessment & Plan Note (Signed)
Will cx urine  Her dip was pos for blood but none seen at all on micro and since her back pain is positional - doubt this is a kidney stone  Enc to drink water

## 2012-08-06 NOTE — Assessment & Plan Note (Signed)
L low back -pt has hx of chronic pain and also fibromyalgia as well  Recommend heat and gentle rom stretches Given px for norco to use with caution (warned not to take with tramadol)  Xray today Suspect muscular Is on muscle relaxer already

## 2012-08-06 NOTE — Assessment & Plan Note (Signed)
This was brief-one episode but may have been exertional (per pt) EKG reassuring and no recurrence  However still relatively sob on exertion - ? If deconditioning  Given risk factors a stress test may be a good idea- we will disc at f/u

## 2012-08-08 LAB — URINE CULTURE: Colony Count: 50000

## 2012-08-10 ENCOUNTER — Encounter: Payer: Self-pay | Admitting: *Deleted

## 2012-08-11 ENCOUNTER — Ambulatory Visit (INDEPENDENT_AMBULATORY_CARE_PROVIDER_SITE_OTHER): Payer: BC Managed Care – PPO | Admitting: Family Medicine

## 2012-08-11 ENCOUNTER — Encounter: Payer: Self-pay | Admitting: Family Medicine

## 2012-08-11 VITALS — BP 122/76 | HR 112 | Temp 98.1°F | Ht 65.5 in | Wt 222.8 lb

## 2012-08-11 DIAGNOSIS — M545 Low back pain, unspecified: Secondary | ICD-10-CM

## 2012-08-11 DIAGNOSIS — R079 Chest pain, unspecified: Secondary | ICD-10-CM

## 2012-08-11 LAB — POCT URINALYSIS DIPSTICK
Blood, UA: NEGATIVE
Glucose, UA: NEGATIVE
Leukocytes, UA: NEGATIVE
Nitrite, UA: NEGATIVE
Spec Grav, UA: 1.025
Urobilinogen, UA: 0.2
pH, UA: 6.5

## 2012-08-11 NOTE — Patient Instructions (Addendum)
We will do cardiology referral at check out  Use heat on your back and walk as much as you can  If you decide you want to pursue physical therapy for your back please let me know

## 2012-08-11 NOTE — Progress Notes (Signed)
Subjective:    Patient ID: Lindsay Fry, female    DOB: [birth year], 52 y.o.   MRN: [id number]  HPI Here for f/u of several issues   Pt was here for low back pain Xray showed some mild deg changes Pain was much better earlier today - then had to do some bending over and then it started to hurt  -- 3-4/ 10 pain scale Suspect spasm  From now on the tramadol seems sufficient for her back pain  Ran out of flexeril - used more (needs one during the day occas) Has been mostly in bed  She needs to strengthen her abd muscles    Urine pos for blood ucx contaminated Re check- clear -no blood   Cp- was exertional in the past  Some sob on exertion occ short of breath lying flat- due to sinuses  No PND  EKG no acute changes  Is interested in a stress test  Does not think she could do a treadmill stress due to chronic pain  Several risk factors Lot of heart and vasc dz in family    Patient Active Problem List   Diagnosis Date Noted  . Chest pain 08/05/2012  . Urinary frequency 08/05/2012  . Low back pain 08/05/2012  . Unspecified hypothyroidism 05/18/2012  . Chronic sinusitis 01/24/2012  . Vertigo, benign positional 01/24/2012  . Other screening mammogram 12/05/2010  . Routine general medical examination at a health care facility 10/14/2010  . FATIGUE 08/09/2009  . POSTMENOPAUSAL STATUS 06/22/2008  . UNSPECIFIED VITAMIN D DEFICIENCY 03/18/2008  . BENIGN NEOPLASM [place] SITE DIGESTIVE SYSTEM 01/09/2007  . HIATAL HERNIA 01/09/2007  . COLONIC POLYPS, ADENOMATOUS, HX OF 01/09/2007  . HYPERCHOLESTEROLEMIA 01/07/2007  . ANXIETY 01/07/2007  . DEPRESSION 01/07/2007  . HYPERTENSION 01/07/2007  . ALLERGIC RHINITIS 01/07/2007  . ASTHMA 01/07/2007  . GERD 01/07/2007  . COLITIS, ULCERATIVE 01/07/2007  . ACNE ROSACEA 01/07/2007  . MIGRAINES, HX OF 01/07/2007  . FIBROMYALGIA 10/30/2006  . INSOMNIA 10/30/2006   Past Medical History  Diagnosis Date  . Rosacea   . Family  history of malignant neoplasm of gastrointestinal tract   . Allergic rhinitis, cause unspecified   . Anxiety state, unspecified   . Unspecified asthma(493.90)   . Benign neoplasm of other and unspecified site of the digestive system   . Ulcerative colitis, unspecified   . Depression   . Fibromyalgia   . GERD (gastroesophageal reflux disease)   . Hiatal hernia   . Pure hypercholesterolemia   . Unspecified essential hypertension   . Unspecified hypothyroidism   . Insomnia, unspecified   . History of migraines   . Other screening mammogram   . Asymptomatic postmenopausal status (age-related) (natural)   . Unspecified vitamin D deficiency   . Edema   . Dysuria   . History of ovarian cyst   . Cyst     on Achilles tendon  . Tubular adenoma of colon 01/10/11   Past Surgical History  Procedure Laterality Date  . Wisdom tooth extraction  1980's  . Tonsillectomy  1984  . Cesarean section  1996/1997    placenta previa, gest DM, pre-eclampsia  . Nasal sinus surgery  05/1997  . Precancerous mole removed    . Cholecystectomy  1997    adhesions also  . Meckel diverticulum excision  1999  . Breast biopsy      on tamoxifen, atypical hyperplasia  . Dexa  04/1999 and 2010    normal  . Colonoscopy  01/2001  Ulcerative colitis  . Esophagogastroduodenoscopy  09/2001    polyp  . Exercise stress test  11/2003    negative  . Nuclear stress test  06/2007    negative  . Colonoscopy  04/2004    UC, polyp  . Colonoscopy  12/08    UC, no polyps  . Sleep study  11/09    no apnea, but did snore (done by [facility])   History  Substance Use Topics  . Smoking status: Former Smoker    Quit date: 01/14/1993  . Smokeless tobacco: Never Used  . Alcohol Use: No     Comment: Recovered ETOH   Family History  Problem Relation Age of Onset  . Coronary artery disease Father   . Colon cancer Father   . Alzheimer's disease Father   . Coronary artery disease Mother   . Hypertension Mother   .  Osteoporosis Mother   . Colitis Mother   . Crohn's disease Mother   . Breast cancer      great aunts  . Coronary artery disease      Uncle (also AAA)  . Diabetes      remote family history  . Stroke Cousin   . Esophageal cancer Neg Hx   . Rectal cancer Neg Hx   . Stomach cancer Neg Hx    Allergies  Allergen Reactions  . Erythromycin     REACTION: GI upset  . Nsaids     REACTION: aggrivate colitis  . Rosuvastatin     REACTION: increased lfts   Current Outpatient Prescriptions on File Prior to Visit  Medication Sig Dispense Refill  . acetaminophen (TYLENOL ARTHRITIS PAIN) 650 MG CR tablet Take 1,300 mg by mouth every 8 (eight) hours as needed.        Marland Kitchen amitriptyline (ELAVIL) 10 MG tablet TAKE 5 TABLETS BY MOUTH EVERY NIGHT AT BEDTIME  150 tablet  11  . amLODipine (NORVASC) 10 MG tablet TAKE 1 TABLET BY MOUTH EVERY NIGHT AT BEDTIME  90 tablet  3  . aspirin 81 MG tablet Take 81 mg by mouth daily.      Marland Kitchen atorvastatin (LIPITOR) 40 MG tablet TAKE  1/2 TABLET BY MOUTH EVERY DAY  15 tablet  3  . bisacodyl (BISACODYL) 5 MG EC tablet Take 5-10 mg by mouth at bedtime as needed.       Marland Kitchen buPROPion (WELLBUTRIN XL) 150 MG 24 hr tablet Take 450 mg by mouth daily.        . busPIRone (BUSPAR) 15 MG tablet Take 15 mg by mouth 4 (four) times daily. Take one at bedtime; may repeat once as needed for anxiety      . calcium carbonate (TUMS - DOSED IN MG ELEMENTAL CALCIUM) 500 MG chewable tablet Chew 1-2 tablets by mouth as needed for heartburn.      . Calcium-Vitamin D-Vitamin K (VIACTIV) 500-100-40 MG-UNT-MCG CHEW Chew 2 tablets by mouth 2 (two) times daily.        . chlorproMAZINE (THORAZINE) 25 MG tablet Take 25-50 mg by mouth 3 (three) times daily. For migraines if frova doesn't help.       . Clindamycin Phosphate foam Apply topically.       . cyclobenzaprine (FLEXERIL) 10 MG tablet TAKE 2 TABLETS BY MOUTH AT BEDTIME  180 tablet  0  . dextroamphetamine (DEXEDRINE SPANSULE) 15 MG 24 hr capsule Take  15 mg by mouth. QD to BID      . fluticasone (FLONASE) 50 MCG/ACT nasal spray  Place 2 sprays into the nose 2 (two) times daily.  16 g  11  . folic acid (FOLVITE) 034 MCG tablet Take 400 mcg by mouth daily.      Marland Kitchen HYDROcodone-acetaminophen (NORCO) 5-325 MG per tablet Take 1 tablet by mouth every 6 (six) hours as needed for pain (for back pain, do not take at the same time as tramadol).  30 tablet  0  . latanoprost (XALATAN) 0.005 % ophthalmic solution Place 1 drop into both eyes At bedtime.      Marland Kitchen levothyroxine (SYNTHROID, LEVOTHROID) 112 MCG tablet Take 224 mcg by mouth daily.       . Multiple Vitamin (MULTIVITAMIN) tablet Take 1 tablet by mouth daily.        Marland Kitchen NARATRIPTAN HCL PO Take by mouth as directed, pt unsure of strength.      . nitrofurantoin (MACRODANTIN) 100 MG capsule Take 100 mg by mouth as directed. With sexual activity       . pantoprazole (PROTONIX) 40 MG tablet Take 1 tablet (40 mg total) by mouth 2 (two) times daily.  60 tablet  5  . Simethicone (GAS-X PO) Take 1 tablet by mouth 4 (four) times daily.      Marland Kitchen spironolactone (ALDACTONE) 25 MG tablet Take 25 mg by mouth 2 (two) times daily.        . traMADol (ULTRAM) 50 MG tablet TAKE 1 TABLET BY MOUTH EVERY MORNING , THEN 1 AT LUNCH AND 2 TABLETS AT BEDTIME  120 tablet  5  . traZODone (DESYREL) 150 MG tablet Take 450 mg by mouth at bedtime.       . valACYclovir (VALTREX) 500 MG tablet Take 500 mg by mouth 2 (two) times daily.        Marland Kitchen venlafaxine (EFFEXOR) 75 MG tablet Take 75 mg by mouth 2 (two) times daily.         No current facility-administered medications on file prior to visit.    Review of Systems   Review of Systems  Constitutional: Negative for fever, appetite change, fatigue and unexpected weight change.  Eyes: Negative for pain and visual disturbance.  Respiratory: Negative for cough and shortness of breath.   Cardiovascular: Negative for  palpitations   neg for PND or orthopnea  Gastrointestinal: Negative for  nausea, diarrhea and constipation.  Genitourinary: Negative for urgency and frequency. neg for blood in urine  Skin: Negative for pallor or rash   MSK pos for fibromyalgia pain and low back pain without radiculopathy Neurological: Negative for weakness, light-headedness, numbness and headaches.  Hematological: Negative for adenopathy. Does not bruise/bleed easily.  Psychiatric/Behavioral: Negative for dysphoric mood. The patient is not nervous/anxious.      Objective:   Physical Exam  Constitutional: She appears well-developed and well-nourished. No distress.  obese and well appearing   HENT:  Head: Normocephalic and atraumatic.  Eyes: Conjunctivae and EOM are normal. Pupils are equal, round, and reactive to light.  Neck: Normal range of motion. Neck supple.  Cardiovascular: Normal rate, regular rhythm and normal heart sounds.  Exam reveals no gallop.   Pulmonary/Chest: Effort normal and breath sounds normal. No respiratory distress. She has no wheezes.  Musculoskeletal: She exhibits tenderness. She exhibits no edema.  bilat peri lumbar muscular tenderness and spasm Much improved rom  Lymphadenopathy:    She has no cervical adenopathy.  Neurological: She is alert. She has normal reflexes. No cranial nerve deficit. She exhibits normal muscle tone. Coordination normal.  Skin: Skin is warm and  dry. No rash noted. No erythema.  Psychiatric: She has a normal mood and affect.          Assessment & Plan:

## 2012-08-13 NOTE — Assessment & Plan Note (Signed)
This has improved Xray reviewed Disc need to walk and use heat PT offered at any time if she wants to do it

## 2012-08-13 NOTE — Assessment & Plan Note (Signed)
Ongoing, intermittent in obese pt with risk factors and family hx Neg stress tests in past  ? If symptoms are at times exertional (and pt is very deconditioned) - I do not believe she could do a treadmill test Rev to cardiol for eval

## 2012-08-18 ENCOUNTER — Encounter: Payer: Self-pay | Admitting: Family Medicine

## 2012-08-19 ENCOUNTER — Other Ambulatory Visit: Payer: Self-pay | Admitting: *Deleted

## 2012-08-19 MED ORDER — ATORVASTATIN CALCIUM 40 MG PO TABS: ORAL_TABLET | ORAL | Status: DC

## 2012-08-27 ENCOUNTER — Ambulatory Visit (INDEPENDENT_AMBULATORY_CARE_PROVIDER_SITE_OTHER): Payer: BC Managed Care – PPO | Admitting: Family Medicine

## 2012-08-27 ENCOUNTER — Encounter: Payer: Self-pay | Admitting: Family Medicine

## 2012-08-27 VITALS — BP 122/76 | HR 93 | Temp 98.2°F | Wt 219.5 lb

## 2012-08-27 DIAGNOSIS — H8110 Benign paroxysmal vertigo, unspecified ear: Secondary | ICD-10-CM

## 2012-08-27 NOTE — Patient Instructions (Addendum)
Review info packet for home PT exercises.  Use meclizine as needed.  Call if not improving in 1-2 week for ENT referral. Call sooner if symptoms worsening or new neurologic changes.

## 2012-08-27 NOTE — Progress Notes (Signed)
  Subjective:    Patient ID: Lindsay Fry, female    DOB: [birth year], 52 y.o.   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 Worse with moving , turning heads, walking. Had initially improved day to day until yesterday when it worsened again.    Using meclizine and dramamine. Which has helped.   No headache. No confusion, no slurred speech, fidgiting a lot.  She has been anxious with mothers recent health issues.  No weakness and no numbness.   No sinus infection symptoms.  Review of Systems  Constitutional: Negative for fever and fatigue.  HENT: Negative for ear pain.   Eyes: Negative for pain.  Respiratory: Negative for chest tightness and shortness of breath.   Cardiovascular: Negative for chest pain, palpitations and leg swelling.  Gastrointestinal: Negative for abdominal pain.  Genitourinary: Negative for dysuria.       Objective:   Physical Exam  Constitutional: She is oriented to person, place, and time. Vital signs are normal. She appears well-developed and well-nourished. She is cooperative.  Non-toxic appearance. She does not appear ill. No distress.  Needs to lie down on table to talk  HENT:  Head: Normocephalic.  Right Ear: Hearing, tympanic membrane, external ear and ear canal normal. Tympanic membrane is not erythematous, not retracted and not bulging.  Left Ear: Hearing, tympanic membrane, external ear and ear canal normal. Tympanic membrane is not erythematous, not retracted and not bulging.  Nose: No mucosal edema or rhinorrhea. Right sinus exhibits no maxillary sinus tenderness and no frontal sinus tenderness. Left sinus exhibits no maxillary sinus tenderness and no frontal sinus tenderness.  Mouth/Throat: Uvula is midline, oropharynx is clear and moist and mucous membranes are normal.  Eyes: Conjunctivae, EOM and lids are normal. Pupils are equal,  round, and reactive to light. Lids are everted and swept, no foreign bodies found.  Fundoscopic exam:      The right eye shows no papilledema.       The left eye shows no papilledema.  Nystagmus with eply  Neck: Trachea normal and normal range of motion. Neck supple. Carotid bruit is not present. No mass and no thyromegaly present.  Cardiovascular: Normal rate, regular rhythm, S1 normal, S2 normal, normal heart sounds, intact distal pulses and normal pulses.  Exam reveals no gallop and no friction rub.   No murmur heard. Pulmonary/Chest: Effort normal and breath sounds normal. Not tachypneic. No respiratory distress. She has no decreased breath sounds. She has no wheezes. She has no rhonchi. She has no rales.  Abdominal: Soft. Normal appearance and bowel sounds are normal. There is no tenderness.  Neurological: She is alert and oriented to person, place, and time. No cranial nerve deficit or sensory deficit. She displays a negative Romberg sign. Coordination and gait normal.  Skin: Skin is warm, dry and intact. No rash noted.  Psychiatric: Her speech is normal and behavior is normal. Judgment and thought content normal. Her mood appears not anxious. Cognition and memory are normal. She does not exhibit a depressed mood.          Assessment & Plan:

## 2012-08-27 NOTE — Assessment & Plan Note (Signed)
No red flags, nml neuro exam.  Given info on home vestibular desensitization.  Use meclizine prn.  Call if not improving in 1-2 week for ENT referral.

## 2012-09-01 ENCOUNTER — Ambulatory Visit: Payer: BC Managed Care – PPO | Admitting: Cardiovascular Disease

## 2012-09-02 ENCOUNTER — Ambulatory Visit (INDEPENDENT_AMBULATORY_CARE_PROVIDER_SITE_OTHER): Payer: BC Managed Care – PPO | Admitting: Cardiovascular Disease

## 2012-09-02 ENCOUNTER — Encounter: Payer: Self-pay | Admitting: Cardiovascular Disease

## 2012-09-02 VITALS — BP 132/87 | HR 103 | Ht 66.5 in | Wt 219.0 lb

## 2012-09-02 DIAGNOSIS — I10 Essential (primary) hypertension: Secondary | ICD-10-CM

## 2012-09-02 DIAGNOSIS — E7800 Pure hypercholesterolemia, unspecified: Secondary | ICD-10-CM

## 2012-09-02 DIAGNOSIS — R42 Dizziness and giddiness: Secondary | ICD-10-CM

## 2012-09-02 DIAGNOSIS — R079 Chest pain, unspecified: Secondary | ICD-10-CM

## 2012-09-02 DIAGNOSIS — H8110 Benign paroxysmal vertigo, unspecified ear: Secondary | ICD-10-CM

## 2012-09-02 DIAGNOSIS — R0602 Shortness of breath: Secondary | ICD-10-CM | POA: Insufficient documentation

## 2012-09-02 NOTE — Progress Notes (Signed)
Patient ID: Lindsay Fry, female    DOB: Oct 27, 1960, 52 y.o.   MRN: [id number]  HPI Comments: Lindsay Fry is a very pleasant 52 year old woman with history of obesity, hypertension, hyperlipidemia, strong family history of heart disease, father who had bypass surgery at 28, presenting by referral for chest pain and shortness of breath.  She reports that several weeks ago, she was at a water park. She developed chest pain and shortness of breath with exertion. Since that time, she feels her shortness of breath has been worse. Symptoms typically present and are worse with exertion. She has had stress tests in the past and does not feel that she can ambulate/walk on a treadmill as she has severe vertigo in the past 10 days. Chest pain symptoms were in the middle of her chest with mild radiation. No significant arm pain. Some discomfort radiating to her back. She reports blood pressure has been relatively well-controlled. Typically heart rate runs high at baseline.  Vertigo symptoms occurred in early 2014 and recurrent August 9. She is taking meclizine. She has dizziness and feels that she is drunk when she stands up.  History of hyperlipidemia. In June 2010, total cholesterol 314. Most recent total cholesterol 174 Remote smoking for a short period of time, none recently.  EKG shows normal sinus rhythm with poor R-wave progression through the anterior precordial leads, left axis deviation,  T wave abnormality in the anterolateral leads concerning for anterolateral ischemia   Outpatient Encounter Prescriptions as of 09/02/2012  Medication Sig Dispense Refill  . acetaminophen (TYLENOL ARTHRITIS PAIN) 650 MG CR tablet Take 1,300 mg by mouth every 8 (eight) hours as needed.        Marland Kitchen amitriptyline (ELAVIL) 10 MG tablet TAKE 5 TABLETS BY MOUTH EVERY NIGHT AT BEDTIME  150 tablet  11  . amLODipine (NORVASC) 10 MG tablet TAKE 1 TABLET BY MOUTH EVERY NIGHT AT BEDTIME  90 tablet  3  . aspirin 81 MG tablet  Take 81 mg by mouth daily.      Marland Kitchen atorvastatin (LIPITOR) 40 MG tablet TAKE  1/2 TABLET BY MOUTH EVERY DAY  15 tablet  5  . bisacodyl (BISACODYL) 5 MG EC tablet Take 5-10 mg by mouth at bedtime as needed.       Marland Kitchen buPROPion (WELLBUTRIN XL) 150 MG 24 hr tablet Take 450 mg by mouth daily.        . busPIRone (BUSPAR) 15 MG tablet Take 15 mg by mouth 4 (four) times daily. Take one at bedtime; may repeat once as needed for anxiety      . calcium carbonate (TUMS - DOSED IN MG ELEMENTAL CALCIUM) 500 MG chewable tablet Chew 1-2 tablets by mouth as needed for heartburn.      . Calcium-Vitamin D-Vitamin K (VIACTIV) 500-100-40 MG-UNT-MCG CHEW Chew 2 tablets by mouth 2 (two) times daily.        . chlorproMAZINE (THORAZINE) 25 MG tablet Take 25-50 mg by mouth 3 (three) times daily. For migraines if frova doesn't help.       . Clindamycin Phosphate foam Apply topically.       . cyclobenzaprine (FLEXERIL) 10 MG tablet TAKE 2 TABLETS BY MOUTH AT BEDTIME  180 tablet  0  . fluticasone (FLONASE) 50 MCG/ACT nasal spray Place 2 sprays into the nose 2 (two) times daily.  16 g  11  . folic acid (FOLVITE) 400 MCG tablet Take 400 mcg by mouth daily.      Marland Kitchen HYDROcodone-acetaminophen (NORCO)  5-325 MG per tablet Take 1 tablet by mouth every 6 (six) hours as needed for pain (for back pain, do not take at the same time as tramadol).  30 tablet  0  . latanoprost (XALATAN) 0.005 % ophthalmic solution Place 1 drop into both eyes At bedtime.      Marland Kitchen levothyroxine (SYNTHROID, LEVOTHROID) 112 MCG tablet Take 224 mcg by mouth daily.       . meclizine (ANTIVERT) 25 MG tablet Take 25 mg by mouth as needed.      . Multiple Vitamin (MULTIVITAMIN) tablet Take 1 tablet by mouth daily.        Marland Kitchen NARATRIPTAN HCL PO Take by mouth as directed, pt unsure of strength.      . pantoprazole (PROTONIX) 40 MG tablet Take 1 tablet (40 mg total) by mouth 2 (two) times daily.  60 tablet  5  . Simethicone (GAS-X PO) Take 1 tablet by mouth 4 (four) times  daily.      Marland Kitchen spironolactone (ALDACTONE) 25 MG tablet Take 25 mg by mouth 2 (two) times daily.        . traMADol (ULTRAM) 50 MG tablet TAKE 1 TABLET BY MOUTH EVERY MORNING , THEN 1 AT LUNCH AND 2 TABLETS AT BEDTIME  120 tablet  5  . traZODone (DESYREL) 150 MG tablet Take 450 mg by mouth at bedtime.       . valACYclovir (VALTREX) 500 MG tablet Take 500 mg by mouth 2 (two) times daily.        Marland Kitchen venlafaxine (EFFEXOR) 75 MG tablet Take 75 mg by mouth 2 (two) times daily.        . nitrofurantoin (MACRODANTIN) 100 MG capsule Take 100 mg by mouth as directed. With sexual activity         Review of Systems  Constitutional: Negative.   HENT: Negative.   Eyes: Negative.   Respiratory: Positive for chest tightness and shortness of breath.   Cardiovascular: Positive for chest pain.  Gastrointestinal: Negative.   Musculoskeletal: Positive for gait problem.  Skin: Negative.   Neurological: Positive for dizziness.  Psychiatric/Behavioral: Negative.   All other systems reviewed and are negative.    BP 132/87  Pulse 103  Ht 5' 6.5" (1.689 m)  Wt 219 lb (99.338 kg)  BMI 34.82 kg/m2  Physical Exam  Nursing note and vitals reviewed. Constitutional: She is oriented to person, place, and time. She appears well-developed and well-nourished.  HENT:  Head: Normocephalic.  Nose: Nose normal.  Mouth/Throat: Oropharynx is clear and moist.  Eyes: Conjunctivae are normal. Pupils are equal, round, and reactive to light.  Neck: Normal range of motion. Neck supple. No JVD present.  Cardiovascular: Normal rate, regular rhythm, S1 normal, S2 normal, normal heart sounds and intact distal pulses.  Exam reveals no gallop and no friction rub.   No murmur heard. Pulmonary/Chest: Effort normal and breath sounds normal. No respiratory distress. She has no wheezes. She has no rales. She exhibits no tenderness.  Abdominal: Soft. Bowel sounds are normal. She exhibits no distension. There is no tenderness.   Musculoskeletal: Normal range of motion. She exhibits no edema and no tenderness.  Lymphadenopathy:    She has no cervical adenopathy.  Neurological: She is alert and oriented to person, place, and time. Coordination normal.  Skin: Skin is warm and dry. No rash noted. No erythema.  Psychiatric: She has a normal mood and affect. Her behavior is normal. Judgment and thought content normal.    Assessment and  Plan

## 2012-09-02 NOTE — Assessment & Plan Note (Signed)
Currently taking meclizine. May need referral to ear nose throat.

## 2012-09-02 NOTE — Assessment & Plan Note (Signed)
Blood pressure is well controlled on today's visit. No changes made to the medications. 

## 2012-09-02 NOTE — Patient Instructions (Addendum)
You are doing well. No medication changes were made.  No caffeine 24 hours before the test No food the morning of the test No amlodipine the night before the test  Please call us if you have new issues that need to be addressed before your next appt.   Your physician wants you to follow-up in: If stress test is normal, follow up once a year  You will receive a reminder letter in the mail two months in advance. If you don't receive a letter, please call our office to schedule the follow-up appointment.       [facility] MYOVIEW  Your caregiver has ordered a Stress Test with nuclear imaging. The purpose of this test is to evaluate the blood supply to your heart muscle. This procedure is referred to as a "Non-Invasive Stress Test." This is because other than having an IV started in your vein, nothing is inserted or "invades" your body. Cardiac stress tests are done to find areas of poor blood flow to the heart by determining the extent of coronary artery disease (CAD). Some patients exercise on a treadmill, which naturally increases the blood flow to your heart, while others who are  unable to walk on a treadmill due to physical limitations have a pharmacologic/chemical stress agent called Lexiscan . This medicine will mimic walking on a treadmill by temporarily increasing your coronary blood flow.   Please note: these test may take anywhere between 2-4 hours to complete  PLEASE REPORT TO [facility]TR[facility]  THE VOLUNTEERS AT THE FIRST DESK WILL DIRECT YOU WHERE TO GO  Date of Procedure:_____________________________________  Arrival Time for Procedure:______________________________  Instructions regarding medication:   ____ : Hold diabetes medication morning of procedure  ____:  Hold other medications as:        No amlodipine the night before the test  follows:_________________________________________________________________________________________________________________________________________________________________________________________________________________________________________________________________________________________  PLEASE NOTIFY THE OFFICE AT LEAST 24 HOURS IN ADVANCE IF YOU ARE UNABLE TO KEEP YOUR APPOINTMENT.  [phone] AND  PLEASE NOTIFY NUCLEAR MEDICINE AT [facility] AT LEAST 24 HOURS IN ADVANCE IF YOU ARE UNABLE TO KEEP YOUR APPOINTMENT. [phone]  How to prepare for your Myoview test:  1. Do not eat or drink after midnight 2. No caffeine for 24 hours prior to test 3. No smoking 24 hours prior to test. 4. Your medication may be taken with water.  If your doctor stopped a medication because of this test, do not take that medication. 5. Ladies, please do not wear dresses.  Skirts or pants are appropriate. Please wear a short sleeve shirt. 6. No perfume, cologne or lotion.

## 2012-09-02 NOTE — Assessment & Plan Note (Signed)
Chest pain concerning for angina. Strong family history. Abnormal EKG. She is unable to treadmill given shortness of breath and significant vertigo in the past 10 days. Set her up for Myoview in the hospital.

## 2012-09-02 NOTE — Assessment & Plan Note (Signed)
Etiology of her shortness of breath is not clear. She does have obesity, deconditioned. Unable to exclude angina and ischemia. Stress test pending

## 2012-09-02 NOTE — Assessment & Plan Note (Signed)
Cholesterol much improved on generic Lipitor

## 2012-09-04 ENCOUNTER — Telehealth: Payer: Self-pay | Admitting: *Deleted

## 2012-09-04 DIAGNOSIS — H8110 Benign paroxysmal vertigo, unspecified ear: Secondary | ICD-10-CM

## 2012-09-04 NOTE — Telephone Encounter (Signed)
ENT ref done - she will get a call to schedule

## 2012-09-04 NOTE — Telephone Encounter (Signed)
Pt saw Dr. Ermalene Searing on 08/27/12, pt said her dizziness is a bout the same and she would like a ENT referral, she would like to see someone in [place] and not Dr. Nelda Marseille (?spelling), please advise

## 2012-09-07 ENCOUNTER — Ambulatory Visit: Payer: Self-pay | Admitting: Cardiovascular Disease

## 2012-09-07 DIAGNOSIS — R079 Chest pain, unspecified: Secondary | ICD-10-CM

## 2012-09-08 ENCOUNTER — Other Ambulatory Visit: Payer: Self-pay

## 2012-09-08 DIAGNOSIS — R0602 Shortness of breath: Secondary | ICD-10-CM

## 2012-09-08 DIAGNOSIS — R079 Chest pain, unspecified: Secondary | ICD-10-CM

## 2012-09-10 ENCOUNTER — Other Ambulatory Visit: Payer: Self-pay | Admitting: Family Medicine

## 2012-09-10 NOTE — Telephone Encounter (Signed)
done

## 2012-09-10 NOTE — Telephone Encounter (Signed)
Please give 180 with one refill

## 2012-09-10 NOTE — Telephone Encounter (Signed)
Ok to refill 

## 2012-09-11 ENCOUNTER — Telehealth: Payer: Self-pay | Admitting: *Deleted

## 2012-09-11 NOTE — Telephone Encounter (Signed)
lmom normal myoview 

## 2012-10-28 ENCOUNTER — Other Ambulatory Visit: Payer: Self-pay | Admitting: Internal Medicine

## 2012-11-10 ENCOUNTER — Other Ambulatory Visit: Payer: Self-pay | Admitting: *Deleted

## 2012-11-10 ENCOUNTER — Other Ambulatory Visit: Payer: BC Managed Care – PPO

## 2012-11-10 ENCOUNTER — Other Ambulatory Visit (INDEPENDENT_AMBULATORY_CARE_PROVIDER_SITE_OTHER): Payer: BC Managed Care – PPO

## 2012-11-10 ENCOUNTER — Other Ambulatory Visit: Payer: Self-pay | Admitting: Endocrinology

## 2012-11-10 DIAGNOSIS — E039 Hypothyroidism, unspecified: Secondary | ICD-10-CM

## 2012-11-10 DIAGNOSIS — E7800 Pure hypercholesterolemia, unspecified: Secondary | ICD-10-CM

## 2012-11-10 DIAGNOSIS — R7301 Impaired fasting glucose: Secondary | ICD-10-CM

## 2012-11-10 LAB — LIPID PANEL
Cholesterol: 183 mg/dL (ref 0–200)
HDL: 43.1 mg/dL (ref >39.00)
LDL Cholesterol: 102 mg/dL — ABNORMAL HIGH (ref 0–99)
Total CHOL/HDL Ratio: 4
Triglycerides: 189 mg/dL — ABNORMAL HIGH (ref 0.0–149.0)
VLDL: 37.8 mg/dL (ref 0.0–40.0)

## 2012-11-10 LAB — GLUCOSE, RANDOM: Glucose, Bld: 132 mg/dL — ABNORMAL HIGH (ref 70–99)

## 2012-11-10 LAB — T4, FREE: Free T4: 0.86 ng/dL (ref 0.60–1.60)

## 2012-11-10 LAB — TSH: TSH: 18.79 u[IU]/mL — ABNORMAL HIGH (ref 0.35–5.50)

## 2012-11-11 ENCOUNTER — Telehealth: Payer: Self-pay | Admitting: Family Medicine

## 2012-11-11 NOTE — Telephone Encounter (Signed)
Patient Information:  Caller Name: [place]  Phone: [phone]  Patient: Lindsay Fry, Lindsay Fry  Gender: Female  DOB: 04-02-1960  Age: 52 Years  PCP: [place], [facility]or, quantity [facility])  Pregnant: No  Office Follow Up:  Does the office need to follow up with this patient?: No  Instructions For The Office: N/A  RN Note:  Menopausal.  At times has difficulty with words. Easily frustrated at times. Thoughts don't come in the right order, "like someone jumbled up the words." Can take hours to get ready to leave home due to gets "so distracted."  Instructed not to drive until evaluated. RN suggested to see MD within 24 hours per nursing judgement but she declined since she has appointment to see Dr Dwyane Dee 11/12/12 and plans to see Psychiatrist soon.  Symptoms  Reason For Call & Symptoms: Abnormal motor skills. Clumsy, loss of coordination, and dizzy when walks.  Also noted sleep deprivation, trouble concentrating and hyper focused.  Suspects metabolism change, possible drug interation or stress.  Has appointment with Dr Dwyane Dee  11/12/12 at 1400 and already had lab drawn.  Asking if should call Psychiatrist, Dr Toy Care.  Reviewed Health History In EMR: Yes  Reviewed Medications In EMR: Yes  Reviewed Allergies In EMR: Yes  Reviewed Surgeries / Procedures: Yes  Date of Onset of Symptoms: 08/13/2012  Treatments Tried: Took ADHD medication, stopped Synthroid for seveal days then put on reduced dose.  Treatments Tried Worked: No OB / GYN:  LMP: Unknown  Guideline(s) Used:  Neurologic Deficit  Confusion - Delirium  Disposition Per Guideline:   See Within 2 Weeks in Office  Reason For Disposition Reached:   Longstanding confusion (e.g., dementia, stroke) and NO worsening  Advice Given:  Call Back If:  You (i.e., patient, family member) become worse.  RN Overrode Recommendation:  Patient Already Has Appt, Document Patient  Advised to see MD within 24 hours; she declined appointment with PCP since  has apptointment with endocrinologist 11/12/12.

## 2012-11-12 ENCOUNTER — Encounter: Payer: Self-pay | Admitting: Endocrinology

## 2012-11-12 ENCOUNTER — Ambulatory Visit (INDEPENDENT_AMBULATORY_CARE_PROVIDER_SITE_OTHER): Payer: BC Managed Care – PPO | Admitting: Endocrinology

## 2012-11-12 VITALS — BP 124/78 | HR 101 | Temp 98.4°F | Resp 12 | Ht 66.0 in | Wt 204.6 lb

## 2012-11-12 DIAGNOSIS — R7301 Impaired fasting glucose: Secondary | ICD-10-CM

## 2012-11-12 DIAGNOSIS — E890 Postprocedural hypothyroidism: Secondary | ICD-10-CM

## 2012-11-12 NOTE — Patient Instructions (Signed)
Synrhroid 200ug daily
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
## 2012-11-18 ENCOUNTER — Encounter: Payer: Self-pay | Admitting: Family Medicine

## 2012-11-18 ENCOUNTER — Ambulatory Visit (INDEPENDENT_AMBULATORY_CARE_PROVIDER_SITE_OTHER): Payer: BC Managed Care – PPO | Admitting: Family Medicine

## 2012-11-18 ENCOUNTER — Encounter: Payer: Self-pay | Admitting: Neurology

## 2012-11-18 ENCOUNTER — Ambulatory Visit: Payer: 59 | Admitting: Family Medicine

## 2012-11-18 VITALS — BP 136/84 | HR 95 | Temp 98.6°F | Ht 66.0 in | Wt 207.0 lb

## 2012-11-18 DIAGNOSIS — E890 Postprocedural hypothyroidism: Secondary | ICD-10-CM

## 2012-11-18 DIAGNOSIS — Z23 Encounter for immunization: Secondary | ICD-10-CM

## 2012-11-18 DIAGNOSIS — I10 Essential (primary) hypertension: Secondary | ICD-10-CM

## 2012-11-18 DIAGNOSIS — F411 Generalized anxiety disorder: Secondary | ICD-10-CM

## 2012-11-18 MED ORDER — ATORVASTATIN CALCIUM 40 MG PO TABS: ORAL_TABLET | ORAL | Status: DC

## 2012-11-18 NOTE — Patient Instructions (Signed)
Continue follow up with psychiatry and neurology  Also - work on thyroid with Dr Dwyane Dee  Keep exercising  Take care of yourself Follow up for annual exam with labs prior with 6 months

## 2012-11-18 NOTE — Progress Notes (Signed)
Subjective:    Patient ID: Lindsay Fry, female    DOB: [birth year], 52 y.o.   MRN: [id number]  HPI Here for f/u of chronic medical problems  Physically feeling decent - but having mania   Mental/ behavioral health is not great -- saw Dr Westley Chandler recently -referred her to neuro- will see Dr Anne Hahn soon   (just to assure neurologically she is intact) Likely bipolar  Lot of anxiety, labile mood, temper issue , concentration issue  Recently she feels manic -- and she did cut her wellbutrin dose  Now with a plan in place- she is less anxious   More migraines with allergies and mood changes   Wt is up 3 lb with bmi of 33 Per pt since spring has lost over 30 lb and not tried to   Flu vaccine - had it this season   tsh is high Sees Dr Lucianne Muss Menopause has changed a lot of her conditions   Hyperlipidemia Lab Results  Component Value Date   CHOL 183 11/10/2012   CHOL 174 05/19/2012   CHOL 168 10/15/2010   Lab Results  Component Value Date   HDL 43.10 11/10/2012   HDL 48.80 05/19/2012   HDL 50.70 10/15/2010   Lab Results  Component Value Date   LDLCALC 102* 11/10/2012   LDLCALC 95 05/19/2012   LDLCALC 96 10/15/2010   Lab Results  Component Value Date   TRIG 189.0* 11/10/2012   TRIG 153.0* 05/19/2012   TRIG 107.0 10/15/2010   Lab Results  Component Value Date   CHOLHDL 4 11/10/2012   CHOLHDL 4 05/19/2012   CHOLHDL 3 10/15/2010   Lab Results  Component Value Date   LDLDIRECT 227.3 06/22/2008       Chemistry      Component Value Date/Time   NA 138 05/19/2012 1206   K 3.8 05/19/2012 1206   CL 104 05/19/2012 1206   CO2 26 05/19/2012 1206   BUN 12 05/19/2012 1206   CREATININE 0.9 05/19/2012 1206      Component Value Date/Time   CALCIUM 9.3 05/19/2012 1206   ALKPHOS 71 05/19/2012 1206   AST 39* 05/19/2012 1206   ALT 46* 05/19/2012 1206   BILITOT 0.7 05/19/2012 1206       Sugar 132-non fasting  She is no longer using artificial sweetners occ sugar soda-not a lot  Is eating a much healthier  diet  Lab Results  Component Value Date   HGBA1C 6.1 05/20/2012      Lab Results  Component Value Date   TSH 18.79* 11/10/2012    Given manic symptoms- pt was surprised this was underactive Dr Lucianne Muss is raising her dose now  Menopause may play a role   Patient Active Problem List   Diagnosis Date Noted  . Subacute confusional state 11/19/2012  . ADD (attention deficit disorder) 11/19/2012  . IFG (impaired fasting glucose) 11/12/2012  . Shortness of breath 09/02/2012  . Chest pain 08/05/2012  . Urinary frequency 08/05/2012  . Low back pain 08/05/2012  . 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 Chronic sinusitis 01/24/2012  . Vertigo, benign positional 01/24/2012  . Other screening mammogram 12/05/2010  . Routine general medical examination at a health care facility 10/14/2010  . FATIGUE 08/09/2009  . POSTMENOPAUSAL STATUS 06/22/2008  . UNSPECIFIED VITAMIN D DEFICIENCY 03/18/2008  . BENIGN NEOPLASM OTH&UNSPEC SITE DIGESTIVE SYSTEM 01/09/2007  . HIATAL HERNIA 01/09/2007  . COLONIC POLYPS, ADENOMATOUS, HX OF 01/09/2007  . HYPERCHOLESTEROLEMIA 01/07/2007  . ANXIETY 01/07/2007  .  DEPRESSION 01/07/2007  . HYPERTENSION 01/07/2007  . ALLERGIC RHINITIS 01/07/2007  . ASTHMA 01/07/2007  . GERD 01/07/2007  . COLITIS, ULCERATIVE 01/07/2007  . ACNE ROSACEA 01/07/2007  . MIGRAINES, HX OF 01/07/2007  . FIBROMYALGIA 10/30/2006  . INSOMNIA 10/30/2006   Past Medical History  Diagnosis Date  . Rosacea   . Family history of malignant neoplasm of gastrointestinal tract   . Allergic rhinitis, cause unspecified   . Anxiety state, unspecified   . Unspecified asthma(493.90)   . Benign neoplasm of other and unspecified site of the digestive system   . Ulcerative colitis, unspecified   . Depression   . Fibromyalgia   . GERD (gastroesophageal reflux disease)   . Hiatal hernia   . Pure hypercholesterolemia   . Unspecified essential hypertension   . Unspecified hypothyroidism   .  Insomnia, unspecified   . History of migraines   . Other screening mammogram   . Asymptomatic postmenopausal status (age-related) (natural)   . Unspecified vitamin D deficiency   . Edema   . Dysuria   . History of ovarian cyst   . Cyst     on Achilles tendon  . Tubular adenoma of colon 01/10/11  . Vertigo   . Subacute confusional state 11/19/2012  . ADD (attention deficit disorder) 11/19/2012  . OCD (obsessive compulsive disorder)   . Glaucoma   . History of alcoholism    Past Surgical History  Procedure Laterality Date  . Wisdom tooth extraction  1980's  . Tonsillectomy  1984  . Cesarean section  1996/1997    placenta previa, gest DM, pre-eclampsia  . Nasal sinus surgery  05/1997  . Precancerous mole removed    . Cholecystectomy  1997    adhesions also  . Meckel diverticulum excision  1999  . Breast biopsy      on tamoxifen, atypical hyperplasia  . Dexa  04/1999 and 2010    normal  . Colonoscopy  01/2001    Ulcerative colitis  . Esophagogastroduodenoscopy  09/2001    polyp  . Exercise stress test  11/2003    negative  . Nuclear stress test  06/2007    negative  . Colonoscopy  04/2004    UC, polyp  . Colonoscopy  12/08    UC, no polyps  . Sleep study  11/09    no apnea, but did snore (done by [facility])  . Bunionectomy Right   . Femur fracture surgery Left    History  Substance Use Topics  . Smoking status: Former Smoker -- 0.25 packs/day for 5 years    Types: Cigarettes    Quit date: 01/14/1993  . Smokeless tobacco: Never Used  . Alcohol Use: No     Comment: Recovered ETOH   Family History  Problem Relation Age of Onset  . Coronary artery disease Father   . Colon cancer Father   . Alzheimer's disease Father   . Dementia Father   . Coronary artery disease Mother   . Hypertension Mother   . Osteoporosis Mother   . Colitis Mother   . Crohn's disease Mother   . Breast cancer      great aunts  . Coronary artery disease      Uncle (also AAA)  . Diabetes       remote family history  . Stroke Cousin   . Esophageal cancer Neg Hx   . Rectal cancer Neg Hx   . Stomach cancer Neg Hx    Allergies  Allergen Reactions  .  Amphetamines     mania  . Aspirin     REACTION: aggrivates colitis  . Caffeine   . Erythromycin     REACTION: GI upset  . Nsaids     REACTION: aggrivate colitis  . Rosuvastatin     REACTION: increased lfts   Current Outpatient Prescriptions on File Prior to Visit  Medication Sig Dispense Refill  . acetaminophen (TYLENOL ARTHRITIS PAIN) 650 MG CR tablet Take 1,300 mg by mouth every 8 (eight) hours as needed.        Marland Kitchen amitriptyline (ELAVIL) 10 MG tablet TAKE 5 TABLETS BY MOUTH EVERY NIGHT AT BEDTIME  150 tablet  11  . amLODipine (NORVASC) 10 MG tablet TAKE 1 TABLET BY MOUTH EVERY NIGHT AT BEDTIME  90 tablet  3  . aspirin 81 MG tablet Take 81 mg by mouth daily.      Marland Kitchen b complex vitamins capsule Take 1 capsule by mouth daily.      . bisacodyl (BISACODYL) 5 MG EC tablet Take 5-10 mg by mouth at bedtime as needed.       Marland Kitchen buPROPion (WELLBUTRIN XL) 150 MG 24 hr tablet Take 300 mg by mouth daily.       . busPIRone (BUSPAR) 15 MG tablet Take 15 mg by mouth 4 (four) times daily. Take one at bedtime; may repeat once as needed for anxiety      . calcium carbonate (TUMS - DOSED IN MG ELEMENTAL CALCIUM) 500 MG chewable tablet Chew 1-2 tablets by mouth as needed for heartburn.      . chlorproMAZINE (THORAZINE) 25 MG tablet Take 25-50 mg by mouth 3 (three) times daily. For migraines if frova doesn't help.       . Clindamycin Phosphate foam Apply topically.       . cyclobenzaprine (FLEXERIL) 10 MG tablet TAKE 2 TABLETS BY MOUTH EVERY NIGHT AT BEDTIME  180 tablet  1  . fluticasone (FLONASE) 50 MCG/ACT nasal spray Place 2 sprays into the nose 2 (two) times daily.  16 g  11  . folic acid (FOLVITE) 400 MCG tablet Take 400 mcg by mouth daily.      Marland Kitchen HYDROcodone-acetaminophen (NORCO) 5-325 MG per tablet Take 1 tablet by mouth every 6 (six) hours  as needed for pain (for back pain, do not take at the same time as tramadol).  30 tablet  0  . latanoprost (XALATAN) 0.005 % ophthalmic solution Place 1 drop into both eyes At bedtime.      Marland Kitchen levothyroxine (SYNTHROID, LEVOTHROID) 112 MCG tablet Take 224 mcg by mouth daily.       . meclizine (ANTIVERT) 25 MG tablet Take 25 mg by mouth as needed.      . Multiple Vitamin (MULTIVITAMIN) tablet Take 1 tablet by mouth daily.        . nitrofurantoin (MACRODANTIN) 100 MG capsule Take 100 mg by mouth as directed. With sexual activity       . pantoprazole (PROTONIX) 40 MG tablet TAKE 1 TABLETS BY MOUTH TWICE DAILY  60 tablet  2  . Probiotic Product (PROBIOTIC DAILY PO) Take by mouth.      . Simethicone (GAS-X PO) Take 1 tablet by mouth 4 (four) times daily.      Marland Kitchen spironolactone (ALDACTONE) 25 MG tablet Take 25 mg by mouth 2 (two) times daily.        . traMADol (ULTRAM) 50 MG tablet TAKE 1 TABLET BY MOUTH EVERY MORNING , THEN 1 AT LUNCH AND 2 TABLETS  AT BEDTIME  120 tablet  5  . traZODone (DESYREL) 150 MG tablet Take 450 mg by mouth at bedtime.       . valACYclovir (VALTREX) 500 MG tablet Take 500 mg by mouth 2 (two) times daily.        Marland Kitchen venlafaxine (EFFEXOR) 75 MG tablet Take 75 mg by mouth 2 (two) times daily.        . vitamin C (ASCORBIC ACID) 500 MG tablet Take 250 mg by mouth daily. Takes 2 a day       No current facility-administered medications on file prior to visit.     Review of Systems Review of Systems  Constitutional: Negative for fever, appetite change, and fatigue Eyes: Negative for pain and visual disturbance.  Respiratory: Negative for cough and shortness of breath.   Cardiovascular: Negative for cp or palpitations    Gastrointestinal: Negative for nausea, diarrhea and constipation.  Genitourinary: Negative for urgency and frequency.  MSK pos for aches and pains from fibromyalgia - especially after overdoing it  Skin: Negative for pallor or rash   Neurological: Negative for  weakness, light-headedness, numbness and pos for headaches pos for tremor  Hematological: Negative for adenopathy. Does not bruise/bleed easily.  Psychiatric/Behavioral: Negative for dysphoric mood. The patient is quite anxious          Objective:   Physical Exam  Constitutional: She appears well-developed and well-nourished. No distress.  obese and well appearing   HENT:  Head: Normocephalic and atraumatic.  Nose: Nose normal.  Mouth/Throat: Oropharynx is clear and moist.  Eyes: Conjunctivae and EOM are normal. Pupils are equal, round, and reactive to light. No scleral icterus.  Neck: Normal range of motion. Neck supple. No JVD present. No thyromegaly present.  Cardiovascular: Normal rate, regular rhythm, normal heart sounds and intact distal pulses.  Exam reveals no gallop.   Pulmonary/Chest: Effort normal and breath sounds normal. No respiratory distress. She has no wheezes. She has no rales.  Abdominal: Soft. Bowel sounds are normal.  Musculoskeletal: She exhibits no edema and no tenderness.  Lymphadenopathy:    She has no cervical adenopathy.  Neurological: She is alert. She has normal reflexes. She displays tremor. No cranial nerve deficit. She exhibits normal muscle tone. Coordination normal.  Skin: Skin is warm and dry. No rash noted. No erythema. No pallor.  Psychiatric: Her mood appears anxious. Her speech is rapid and/or pressured and tangential. She is hyperactive. Thought content is not paranoid. She expresses no homicidal and no suicidal ideation.  As usual- cognition seems mildly slowed She is inattentive.          Assessment & Plan:

## 2012-11-18 NOTE — Progress Notes (Signed)
Pre-visit discussion using our clinic review tool. No additional management support is needed unless otherwise documented below in the visit note.  

## 2012-11-19 ENCOUNTER — Encounter: Payer: Self-pay | Admitting: Neurology

## 2012-11-19 ENCOUNTER — Ambulatory Visit (INDEPENDENT_AMBULATORY_CARE_PROVIDER_SITE_OTHER): Payer: BC Managed Care – PPO | Admitting: Neurology

## 2012-11-19 VITALS — BP 122/79 | HR 98 | Ht 66.0 in | Wt 208.0 lb

## 2012-11-19 DIAGNOSIS — IMO0001 Reserved for inherently not codable concepts without codable children: Secondary | ICD-10-CM

## 2012-11-19 DIAGNOSIS — F988 Other specified behavioral and emotional disorders with onset usually occurring in childhood and adolescence: Secondary | ICD-10-CM | POA: Insufficient documentation

## 2012-11-19 DIAGNOSIS — Z87898 Personal history of other specified conditions: Secondary | ICD-10-CM

## 2012-11-19 DIAGNOSIS — R410 Disorientation, unspecified: Secondary | ICD-10-CM

## 2012-11-19 HISTORY — DX: Disorientation, unspecified: R41.0

## 2012-11-19 HISTORY — DX: Other specified behavioral and emotional disorders with onset usually occurring in childhood and adolescence: F98.8

## 2012-11-19 NOTE — Assessment & Plan Note (Signed)
BP: 136/84 mmHg  bp in fair control at this time  No changes needed  Disc lifstyle change with low sodium diet and exercise

## 2012-11-19 NOTE — Patient Instructions (Signed)
Attention Deficit Hyperactivity Disorder Attention deficit hyperactivity disorder (ADHD) is a problem with behavior issues based on the way the brain functions (neurobehavioral disorder). It is a common reason for behavior and academic problems in school. CAUSES  The cause of ADHD is unknown in most cases. It may run in families. It sometimes can be associated with learning disabilities and other behavioral problems. SYMPTOMS  There are 3 types of ADHD. The 3 types and some of the symptoms include:  Inattentive  Gets bored or distracted easily.  Loses or forgets things. Forgets to hand in homework.  Has trouble organizing or completing tasks.  Difficulty staying on task.  An inability to organize daily tasks and school work.  Leaving projects, chores, or homework unfinished.  Trouble paying attention or responding to details. Careless mistakes.  Difficulty following directions. Often seems like is not listening.  Dislikes activities that require sustained attention (like chores or homework).  Hyperactive-impulsive  Feels like it is impossible to sit still or stay in a seat. Fidgeting with hands and feet.  Trouble waiting turn.  Talking too much or out of turn. Interruptive.  Speaks or acts impulsively.  Aggressive, disruptive behavior.  Constantly busy or on the go, noisy.  Combined  Has symptoms of both of the above. Often children with ADHD feel discouraged about themselves and with school. They often perform well below their abilities in school. These symptoms can cause problems in home, school, and in relationships with peers. As children get older, the excess motor activities can calm down, but the problems with paying attention and staying organized persist. Most children do not outgrow ADHD but with good treatment can learn to cope with the symptoms. DIAGNOSIS  When ADHD is suspected, the diagnosis should be made by professionals trained in ADHD.  Diagnosis will  include:  Ruling out other reasons for the child's behavior.  The caregivers will check with the child's school and check their medical records.  They will talk to teachers and parents.  Behavior rating scales for the child will be filled out by those dealing with the child on a daily basis. A diagnosis is made only after all information has been considered. TREATMENT  Treatment usually includes behavioral treatment often along with medicines. It may include stimulant medicines. The stimulant medicines decrease impulsivity and hyperactivity and increase attention. Other medicines used include antidepressants and certain blood pressure medicines. Most experts agree that treatment for ADHD should address all aspects of the child's functioning. Treatment should not be limited to the use of medicines alone. Treatment should include structured classroom management. The parents must receive education to address rewarding good behavior, discipline, and limit-setting. Tutoring or behavioral therapy or both should be available for the child. If untreated, the disorder can have long-term serious effects into adolescence and adulthood. HOME CARE INSTRUCTIONS   Often with ADHD there is a lot of frustration among the family in dealing with the illness. There is often blame and anger that is not warranted. This is a life long illness. There is no way to prevent ADHD. In many cases, because the problem affects the family as a whole, the entire family may need help. A therapist can help the family find better ways to handle the disruptive behaviors and promote change. If the child is young, most of the therapist's work is with the parents. Parents will learn techniques for coping with and improving their child's behavior. Sometimes only the child with the ADHD needs counseling. Your caregivers can help  you make these decisions.  Children with ADHD may need help in organizing. Some helpful tips include:  Keep  routines the same every day from wake-up time to bedtime. Schedule everything. This includes homework and playtime. This should include outdoor and indoor recreation. Keep the schedule on the refrigerator or a bulletin board where it is frequently seen. Mark schedule changes as far in advance as possible.  Have a place for everything and keep everything in its place. This includes clothing, backpacks, and school supplies.  Encourage writing down assignments and bringing home needed books.  Offer your child a well-balanced diet. Breakfast is especially important for school performance. Children should avoid drinks with caffeine including:  Soft drinks.  Coffee.  Tea.  However, some older children (adolescents) may find these drinks helpful in improving their attention.  Children with ADHD need consistent rules that they can understand and follow. If rules are followed, give small rewards. Children with ADHD often receive, and expect, criticism. Look for good behavior and praise it. Set realistic goals. Give clear instructions. Look for activities that can foster success and self-esteem. Make time for pleasant activities with your child. Give lots of affection.  Parents are their children's greatest advocates. Learn as much as possible about ADHD. This helps you become a stronger and better advocate for your child. It also helps you educate your child's teachers and instructors if they feel inadequate in these areas. Parent support groups are often helpful. A national group with local chapters is called CHADD (Children and Adults with Attention Deficit Hyperactivity Disorder). PROGNOSIS  There is no cure for ADHD. Children with the disorder seldom outgrow it. Many find adaptive ways to accommodate the ADHD as they mature. SEEK MEDICAL CARE IF:  Your child has repeated muscle twitches, cough or speech outbursts.  Your child has sleep problems.  Your child has a marked loss of  appetite.  Your child develops depression.  Your child has new or worsening behavioral problems.  Your child develops dizziness.  Your child has a racing heart.  Your child has stomach pains.  Your child develops headaches. Document Released: 12/21/2001 Document Revised: 03/25/2011 Document Reviewed: 07/22/2012 [facility] Patient Information 2014 [place], [place].

## 2012-11-19 NOTE — Assessment & Plan Note (Signed)
Now with signs of mania - strongly suspect she is bipolar Will continue f/u with psychiatry and her wellbutrin was cut in dose Also has neuro eval upcoming - (for problems concentrating) Will likely go on mood stabilizer

## 2012-11-19 NOTE — Assessment & Plan Note (Signed)
tsh is high- dose of suppl was increased Followed by Dr Lucianne Muss
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
## 2012-11-20 ENCOUNTER — Other Ambulatory Visit: Payer: Self-pay | Admitting: *Deleted

## 2012-11-20 DIAGNOSIS — Z87898 Personal history of other specified conditions: Secondary | ICD-10-CM

## 2012-11-20 DIAGNOSIS — F909 Attention-deficit hyperactivity disorder, unspecified type: Secondary | ICD-10-CM

## 2012-11-20 DIAGNOSIS — IMO0001 Reserved for inherently not codable concepts without codable children: Secondary | ICD-10-CM

## 2012-11-20 DIAGNOSIS — R410 Disorientation, unspecified: Secondary | ICD-10-CM

## 2012-11-20 NOTE — Addendum Note (Signed)
Addended by: Margette Fast on: 11/20/2012 12:06 PM   Modules accepted: Orders

## 2012-11-20 NOTE — Addendum Note (Signed)
Addended byHermenia Fiscal on: 11/20/2012 11:37 AM   Modules accepted: Orders

## 2012-11-24 ENCOUNTER — Other Ambulatory Visit: Payer: BC Managed Care – PPO

## 2012-11-25 ENCOUNTER — Ambulatory Visit (INDEPENDENT_AMBULATORY_CARE_PROVIDER_SITE_OTHER): Payer: BC Managed Care – PPO

## 2012-11-25 DIAGNOSIS — R51 Headache: Secondary | ICD-10-CM

## 2012-11-25 DIAGNOSIS — Z87898 Personal history of other specified conditions: Secondary | ICD-10-CM

## 2012-11-25 DIAGNOSIS — F988 Other specified behavioral and emotional disorders with onset usually occurring in childhood and adolescence: Secondary | ICD-10-CM

## 2012-11-25 DIAGNOSIS — IMO0001 Reserved for inherently not codable concepts without codable children: Secondary | ICD-10-CM

## 2012-11-25 DIAGNOSIS — R410 Disorientation, unspecified: Secondary | ICD-10-CM

## 2012-11-26 ENCOUNTER — Telehealth: Payer: Self-pay | Admitting: Neurology

## 2012-11-26 ENCOUNTER — Other Ambulatory Visit: Payer: Self-pay | Admitting: Family Medicine

## 2012-11-26 ENCOUNTER — Ambulatory Visit: Payer: BC Managed Care – PPO | Admitting: Endocrinology

## 2012-11-26 NOTE — Telephone Encounter (Signed)
I called patient. MRI the brain shows minimal white matter changes. The patient has a history of hypertension, dyslipidemia, and migraine headache. Changes seen likely did not explain her difficulty with memory and concentration. EEG study is pending, the patient will have formal neuropsychological testing.

## 2012-11-27 ENCOUNTER — Ambulatory Visit (INDEPENDENT_AMBULATORY_CARE_PROVIDER_SITE_OTHER): Payer: BC Managed Care – PPO | Admitting: Radiology

## 2012-11-27 ENCOUNTER — Telehealth: Payer: Self-pay | Admitting: Neurology

## 2012-11-27 DIAGNOSIS — IMO0001 Reserved for inherently not codable concepts without codable children: Secondary | ICD-10-CM

## 2012-11-27 DIAGNOSIS — Z87898 Personal history of other specified conditions: Secondary | ICD-10-CM

## 2012-11-27 DIAGNOSIS — F988 Other specified behavioral and emotional disorders with onset usually occurring in childhood and adolescence: Secondary | ICD-10-CM

## 2012-11-27 DIAGNOSIS — R410 Disorientation, unspecified: Secondary | ICD-10-CM

## 2012-11-27 MED ORDER — TRAMADOL HCL 50 MG PO TABS: 50.0000 mg | ORAL_TABLET | Freq: Four times a day (QID) | ORAL | Status: DC | PRN

## 2012-11-27 NOTE — Telephone Encounter (Signed)
rx called into pharmacy

## 2012-11-27 NOTE — Addendum Note (Signed)
Addended by: Sueanne Margarita on: 11/27/2012 02:49 PM   Modules accepted: Orders

## 2012-11-27 NOTE — Procedures (Signed)
    History:  Lindsay Fry is a 52 year old patient with a history of bipolar disorder, ADD, and migraine headache. The patient reports episodes of "lost time" since the spring of 2014. The patient is being evaluated for these episodes.  This is a routine EEG. No skull defects are noted. Medications include amitriptyline, Norvasc, aspirin, Lipitor, Dulcolax, Wellbutrin, BuSpar, Thorazine, Flexeril, Flonase, hydrocodone, Synthroid, meclizine, Amerge, Protonix, Aldactone, Ultram, trazodone, Valtrex, Effexor, and vitamin C.   EEG classification: Normal awake and asleep  Description of the recording: The background rhythms of this recording consists of a fairly well modulated medium amplitude background activity of 9 Hz. As the record progresses, the patient initially is in the waking state, but appears to enter the early stage II sleep during the recording, with rudimentary sleep spindles and vertex sharp wave activity seen. During the wakeful state, photic stimulation is performed, and this results in a bilateral and symmetric photic driving response. Hyperventilation was also performed, and this results in a minimal buildup of the background rhythm activities without significant slowing seen. At no time during the recording does there appear to be evidence of spike or spike wave discharges or evidence of focal slowing. EKG monitor shows no evidence of cardiac rhythm abnormalities with a heart rate of 90.  Impression: This is a normal EEG recording in the waking and sleeping state. No evidence of ictal or interictal discharges were seen at any time during the recording.

## 2012-11-27 NOTE — Telephone Encounter (Signed)
Ok to phone in.

## 2012-11-27 NOTE — Telephone Encounter (Signed)
I called patient. The EEG study was unremarkable. 

## 2012-12-12 ENCOUNTER — Other Ambulatory Visit: Payer: Self-pay | Admitting: Family Medicine

## 2012-12-15 ENCOUNTER — Other Ambulatory Visit: Payer: Self-pay | Admitting: Family Medicine

## 2012-12-17 NOTE — Telephone Encounter (Signed)
I think she should probably get this from her psychiatrist with her other psychiatric meds - you can check with her , but I would prefer that

## 2012-12-17 NOTE — Telephone Encounter (Signed)
Electronic refill request, please advise  

## 2012-12-22 ENCOUNTER — Other Ambulatory Visit (INDEPENDENT_AMBULATORY_CARE_PROVIDER_SITE_OTHER): Payer: BC Managed Care – PPO

## 2012-12-22 DIAGNOSIS — R7301 Impaired fasting glucose: Secondary | ICD-10-CM

## 2012-12-22 LAB — HEMOGLOBIN A1C: Hgb A1c MFr Bld: 5.6 % (ref 4.6–6.5)

## 2012-12-24 ENCOUNTER — Ambulatory Visit (INDEPENDENT_AMBULATORY_CARE_PROVIDER_SITE_OTHER): Payer: BC Managed Care – PPO | Admitting: Endocrinology

## 2012-12-24 ENCOUNTER — Encounter: Payer: Self-pay | Admitting: Endocrinology

## 2012-12-24 ENCOUNTER — Ambulatory Visit: Payer: BC Managed Care – PPO | Admitting: Endocrinology

## 2012-12-24 VITALS — BP 132/80 | HR 96 | Temp 98.4°F | Resp 12 | Ht 66.0 in | Wt 204.0 lb

## 2012-12-24 DIAGNOSIS — E890 Postprocedural hypothyroidism: Secondary | ICD-10-CM
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
## 2012-12-25 ENCOUNTER — Other Ambulatory Visit: Payer: Self-pay | Admitting: *Deleted

## 2012-12-25 LAB — T4, FREE: Free T4: 1.47 ng/dL (ref 0.60–1.60)

## 2012-12-25 LAB — TSH: TSH: 0.25 u[IU]/mL — ABNORMAL LOW (ref 0.35–5.50)

## 2012-12-25 MED ORDER — LEVOTHYROXINE SODIUM 200 MCG PO TABS: 200.0000 ug | ORAL_TABLET | Freq: Every day | ORAL | Status: DC

## 2012-12-25 MED ORDER — SYNTHROID 200 MCG PO TABS: 200.0000 ug | ORAL_TABLET | Freq: Every day | ORAL | Status: DC

## 2012-12-25 NOTE — Progress Notes (Signed)
Quick Note:  Please let patient know that the TSH is low, REDUCE DOSE TO 200UG qd ______

## 2012-12-28 ENCOUNTER — Other Ambulatory Visit: Payer: Self-pay | Admitting: Internal Medicine

## 2012-12-28 NOTE — Telephone Encounter (Signed)
Please phone in tramadol

## 2012-12-28 NOTE — Telephone Encounter (Signed)
Pt request status of tramadol refill; pt out of med. Medication phoned to [place] spoke with Lattie Haw as instructed. Med will be ready for pick up in 1 hr. Pt notified.

## 2013-01-04 ENCOUNTER — Telehealth: Payer: Self-pay | Admitting: Neurology

## 2013-01-04 NOTE — Telephone Encounter (Signed)
Neuropsychological evaluation was completed. This is not consistent with a neurodegenerative process such as Alzheimer's disease. The study was consistent with ADHD with associated significant clinical depression and moderate to severe anxiety. I called the patient, and I discussed the results of the study with her.

## 2013-01-18 ENCOUNTER — Ambulatory Visit (INDEPENDENT_AMBULATORY_CARE_PROVIDER_SITE_OTHER): Payer: BC Managed Care – PPO | Admitting: Family Medicine

## 2013-01-18 ENCOUNTER — Encounter: Payer: Self-pay | Admitting: Family Medicine

## 2013-01-18 VITALS — BP 108/70 | HR 96 | Temp 98.9°F | Wt 195.5 lb

## 2013-01-18 DIAGNOSIS — J019 Acute sinusitis, unspecified: Secondary | ICD-10-CM

## 2013-01-18 DIAGNOSIS — R05 Cough: Secondary | ICD-10-CM

## 2013-01-18 DIAGNOSIS — R059 Cough, unspecified: Secondary | ICD-10-CM

## 2013-01-18 LAB — POCT INFLUENZA A/B
Influenza A, POC: NEGATIVE
Influenza B, POC: NEGATIVE

## 2013-01-18 MED ORDER — AMOXICILLIN-POT CLAVULANATE 875-125 MG PO TABS: 1.0000 | ORAL_TABLET | Freq: Two times a day (BID) | ORAL | Status: DC

## 2013-01-18 NOTE — Patient Instructions (Signed)
Start the augmentin today and try to get some rest.  Try to rest your voice and drink plenty of fluids.   Take care.

## 2013-01-18 NOTE — Progress Notes (Signed)
Pre-visit discussion using our clinic review tool. No additional management support is needed unless otherwise documented below in the visit note.  Mood much improved after recent start of geodon.  Sx started about 01/07/13. Sick contacts at home.  She was slowly improving until about 2 days ago.  Cough, aches.  Tylenol helps some. Temp 100 today.  Had a flu shot this fall.  Mild ear pain.  ST recently.  No diarrhea.  Thick sputum, green, with cough.    Meds, vitals, and allergies reviewed.   ROS: See HPI.  Otherwise, noncontributory.  GEN: nad, alert and oriented HEENT: mucous membranes moist, tm w/o erythema, nasal exam w/o erythema, clear discharge noted,  OP with cobblestoning, max sinuses ttp B NECK: supple w/o LA CV: rrr.   PULM:  Coarse but ctab, no inc wob, no focal dec in BS EXT: no edema SKIN: no acute rash  Flu neg.

## 2013-01-18 NOTE — Assessment & Plan Note (Signed)
Would treat with the second sickening and the max tenderness B. She agrees.  Okay for outpatient fu.  Flu neg.  Supportive care o/w.

## 2013-01-28 ENCOUNTER — Other Ambulatory Visit: Payer: Self-pay | Admitting: Internal Medicine

## 2013-01-29 NOTE — Telephone Encounter (Signed)
Electronic refill request. Please advise.

## 2013-01-29 NOTE — Telephone Encounter (Signed)
Rx called in as prescribed 

## 2013-01-29 NOTE — Telephone Encounter (Signed)
Px written for call in   

## 2013-02-03 ENCOUNTER — Encounter: Payer: Self-pay | Admitting: Family Medicine

## 2013-02-03 ENCOUNTER — Ambulatory Visit (INDEPENDENT_AMBULATORY_CARE_PROVIDER_SITE_OTHER): Payer: BC Managed Care – PPO | Admitting: Family Medicine

## 2013-02-03 VITALS — BP 124/84 | HR 88 | Temp 98.1°F | Wt 199.8 lb

## 2013-02-03 DIAGNOSIS — J019 Acute sinusitis, unspecified: Secondary | ICD-10-CM

## 2013-02-03 MED ORDER — AMOXICILLIN-POT CLAVULANATE 875-125 MG PO TABS: 1.0000 | ORAL_TABLET | Freq: Two times a day (BID) | ORAL | Status: DC

## 2013-02-03 NOTE — Patient Instructions (Signed)
I would restart the antibiotics and use the neti pot. This should gradually improve.  Take care.  Glad to see you.

## 2013-02-03 NOTE — Assessment & Plan Note (Signed)
Presumed, likely incomplete response to the augmentin tx.  Would restart.  Nontoxic.  D/w pt.  She agrees.  Supportive tx o/w.  F/u prn.

## 2013-02-03 NOTE — Progress Notes (Signed)
Pre-visit discussion using our clinic review tool. No additional management support is needed unless otherwise documented below in the visit note.  She was treated earlier this month, flu neg.  Was done with augmentin on the 15th. Had improved on med. Since then with more ST and post nasal gtt.  Fevers in the meantime, unless she has tylenol.  No vomiting, diarrhea.  No rash.  Some cough. No sputum.  Thick green rhinorrhea and post nasal gtt.    Meds, vitals, and allergies reviewed.   ROS: See HPI.  Otherwise, noncontributory.  GEN: nad, alert and oriented HEENT: mucous membranes moist, tm w/o erythema, nasal exam w/o erythema, clear discharge noted,  OP with cobblestoning, max sinuses ttp NECK: supple w/o LA CV: rrr.   PULM: ctab, no inc wob EXT: no edema SKIN: no acute rash

## 2013-02-15 ENCOUNTER — Telehealth: Payer: Self-pay | Admitting: *Deleted

## 2013-02-15 NOTE — Telephone Encounter (Signed)
Pt calling stating that today is the last day of her antibiotic and she's not feeling any better and is asking if she needs to come in or can you call in something different?

## 2013-02-15 NOTE — Telephone Encounter (Signed)
Left detailed message on voicemail to call the office for an appointment.

## 2013-02-15 NOTE — Telephone Encounter (Signed)
I would feel better about rechecking her.  Is there anyway to get her in tomorrow with me or another MD?  Thanks.

## 2013-02-17 ENCOUNTER — Encounter: Payer: Self-pay | Admitting: Family Medicine

## 2013-02-17 ENCOUNTER — Ambulatory Visit (INDEPENDENT_AMBULATORY_CARE_PROVIDER_SITE_OTHER): Payer: BC Managed Care – PPO | Admitting: Family Medicine

## 2013-02-17 VITALS — BP 138/92 | HR 100 | Temp 98.7°F | Ht 66.0 in | Wt 201.0 lb

## 2013-02-17 DIAGNOSIS — J329 Chronic sinusitis, unspecified: Secondary | ICD-10-CM

## 2013-02-17 DIAGNOSIS — R3989 Other symptoms and signs involving the genitourinary system: Secondary | ICD-10-CM | POA: Insufficient documentation

## 2013-02-17 LAB — POCT URINALYSIS DIPSTICK
Bilirubin, UA: NEGATIVE
Glucose, UA: NEGATIVE
Leukocytes, UA: NEGATIVE
Nitrite, UA: NEGATIVE
Spec Grav, UA: 1.025
Urobilinogen, UA: 0.2
pH, UA: 6

## 2013-02-17 NOTE — Progress Notes (Signed)
Subjective:    Patient ID: Lindsay Fry, female    DOB: 1960/08/20, 53 y.o.   MRN: [id number]  HPI Here for ongoing sinus symptoms  Has had 2 rounds of augmentin   After finishing antibiotics her symptoms return  Has had to have sinus surgery in the past  The mucous is very thick- with yellow/ green color   Most of facial pain is around eyes  Also post throat soreness Low grade fever at home  Feels sick and tired   She is interested in seeing Dr Richardson Landry at [facility]  Also seems to have symptoms of a uti  Needs to give a urine specimen - it shows ketones and sm protein-no leukocytes  Some pressure over bladder  No flank pain  No burning and no frequency  She tends to hold her urine too long at times (when she is manic)   Patient Active Problem List   Diagnosis Date Noted  . Bladder pain 02/17/2013  . Sinusitis, acute 01/18/2013  . Subacute confusional state 11/19/2012  . ADD (attention deficit disorder) 11/19/2012  . IFG (impaired fasting glucose) 11/12/2012  . Shortness of breath 09/02/2012  . Chest pain 08/05/2012  . Urinary frequency 08/05/2012  . Low back pain 08/05/2012  . 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 Chronic sinusitis 01/24/2012  . Vertigo, benign positional 01/24/2012  . Other screening mammogram 12/05/2010  . Routine general medical examination at a health care facility 10/14/2010  . FATIGUE 08/09/2009  . POSTMENOPAUSAL STATUS 06/22/2008  . UNSPECIFIED VITAMIN D DEFICIENCY 03/18/2008  . BENIGN NEOPLASM [place] SITE DIGESTIVE SYSTEM 01/09/2007  . HIATAL HERNIA 01/09/2007  . COLONIC POLYPS, ADENOMATOUS, HX OF 01/09/2007  . HYPERCHOLESTEROLEMIA 01/07/2007  . ANXIETY 01/07/2007  . DEPRESSION 01/07/2007  . HYPERTENSION 01/07/2007  . ALLERGIC RHINITIS 01/07/2007  . ASTHMA 01/07/2007  . GERD 01/07/2007  . COLITIS, ULCERATIVE 01/07/2007  . ACNE ROSACEA 01/07/2007  . MIGRAINES, HX OF 01/07/2007  . FIBROMYALGIA 10/30/2006  .  INSOMNIA 10/30/2006   Past Medical History  Diagnosis Date  . Rosacea   . Family history of malignant neoplasm of gastrointestinal tract   . Allergic rhinitis, cause unspecified   . Anxiety state, unspecified   . Unspecified asthma(493.90)   . Benign neoplasm of other and unspecified site of the digestive system   . Ulcerative colitis, unspecified   . Depression   . Fibromyalgia   . GERD (gastroesophageal reflux disease)   . Hiatal hernia   . Pure hypercholesterolemia   . Unspecified essential hypertension   . Unspecified hypothyroidism   . Insomnia, unspecified   . History of migraines   . Other screening mammogram   . Asymptomatic postmenopausal status (age-related) (natural)   . Unspecified vitamin D deficiency   . Edema   . Dysuria   . History of ovarian cyst   . Cyst     on Achilles tendon  . Tubular adenoma of colon 01/10/11  . Vertigo   . Subacute confusional state 11/19/2012  . ADD (attention deficit disorder) 11/19/2012  . OCD (obsessive compulsive disorder)   . Glaucoma   . History of alcoholism    Past Surgical History  Procedure Laterality Date  . Wisdom tooth extraction  1980's  . Tonsillectomy  1984  . Cesarean section  1996/1997    placenta previa, gest DM, pre-eclampsia  . Nasal sinus surgery  05/1997  . Precancerous mole removed    . Cholecystectomy  1997    adhesions also  .  Meckel diverticulum excision  1999  . Breast biopsy      on tamoxifen, atypical hyperplasia  . Dexa  04/1999 and 2010    normal  . Colonoscopy  01/2001    Ulcerative colitis  . Esophagogastroduodenoscopy  09/2001    polyp  . Exercise stress test  11/2003    negative  . Nuclear stress test  06/2007    negative  . Colonoscopy  04/2004    UC, polyp  . Colonoscopy  12/08    UC, no polyps  . Sleep study  11/09    no apnea, but did snore (done by [facility])  . Bunionectomy Right   . Femur fracture surgery Left    History  Substance Use Topics  . Smoking status: Former  Smoker -- 0.25 packs/day for 5 years    Types: Cigarettes    Quit date: 01/14/1993  . Smokeless tobacco: Never Used  . Alcohol Use: No     Comment: Recovered ETOH   Family History  Problem Relation Age of Onset  . Coronary artery disease Father   . Colon cancer Father   . Alzheimer's disease Father   . Dementia Father   . Coronary artery disease Mother   . Hypertension Mother   . Osteoporosis Mother   . Colitis Mother   . Crohn's disease Mother   . Breast cancer      great aunts  . Coronary artery disease      Uncle (also AAA)  . Diabetes      remote family history  . Stroke Cousin   . Esophageal cancer Neg Hx   . Rectal cancer Neg Hx   . Stomach cancer Neg Hx    Allergies  Allergen Reactions  . Amphetamines     mania  . Aspirin     REACTION: aggrivates colitis  . Caffeine   . Erythromycin     REACTION: GI upset  . Nsaids     REACTION: aggrivate colitis  . Rosuvastatin     REACTION: increased lfts   Current Outpatient Prescriptions on File Prior to Visit  Medication Sig Dispense Refill  . acetaminophen (TYLENOL ARTHRITIS PAIN) 650 MG CR tablet Take 1,300 mg by mouth every 8 (eight) hours as needed.        Marland Kitchen amLODipine (NORVASC) 10 MG tablet TAKE 1 TABLET BY MOUTH EVERY NIGHT AT BEDTIME  90 tablet  1  . aspirin 81 MG tablet Take 81 mg by mouth daily.      Marland Kitchen atorvastatin (LIPITOR) 40 MG tablet TAKE  1/2 TABLET BY MOUTH EVERY DAY  45 tablet  3  . b complex vitamins capsule Take 1 capsule by mouth daily.      . bisacodyl (BISACODYL) 5 MG EC tablet Take 5-10 mg by mouth at bedtime as needed for severe constipation.       Marland Kitchen buPROPion (WELLBUTRIN XL) 150 MG 24 hr tablet Take 300 mg by mouth daily.       . calcium carbonate (TUMS - DOSED IN MG ELEMENTAL CALCIUM) 500 MG chewable tablet Chew 1-2 tablets by mouth as needed for heartburn.      . chlorproMAZINE (THORAZINE) 25 MG tablet Take 25-50 mg by mouth 3 (three) times daily. For migraines if frova doesn't help.       .  Clindamycin Phosphate foam Apply topically.       . cyclobenzaprine (FLEXERIL) 10 MG tablet TAKE 2 TABLETS BY MOUTH EVERY NIGHT AT BEDTIME  180 tablet  1  . fluticasone (FLONASE) 50 MCG/ACT nasal spray Place 2 sprays into the nose 2 (two) times daily.  16 g  11  . folic acid (FOLVITE) 102 MCG tablet Take 400 mcg by mouth daily.      Marland Kitchen latanoprost (XALATAN) 0.005 % ophthalmic solution Place 1 drop into both eyes At bedtime.      . Multiple Vitamin (MULTIVITAMIN) tablet Take 1 tablet by mouth daily.        . naratriptan (AMERGE) 2.5 MG tablet Take 2.5 mg by mouth 2 (two) times daily as needed for migraine. Take one (1) tablet at onset of headache; if returns or does not resolve, may repeat after 4 hours; do not exceed five (5) mg in 24 hours.      . nitrofurantoin (MACRODANTIN) 100 MG capsule Take 100 mg by mouth as directed. With sexual activity       . pantoprazole (PROTONIX) 40 MG tablet TAKE 1 TABLET BY MOUTH TWICE DAILY  60 tablet  1  . Probiotic Product (PROBIOTIC DAILY PO) Take by mouth.      . Simethicone (GAS-X PO) Take 1 tablet by mouth 4 (four) times daily.      Marland Kitchen spironolactone (ALDACTONE) 25 MG tablet Take 25 mg by mouth 2 (two) times daily.        Marland Kitchen SYNTHROID 200 MCG tablet Take 1 tablet (200 mcg total) by mouth daily before breakfast.  30 tablet  5  . traMADol (ULTRAM) 50 MG tablet take 4 tabs a day      . traZODone (DESYREL) 50 MG tablet Take 25 mg by mouth at bedtime as needed for sleep.       . valACYclovir (VALTREX) 500 MG tablet Take 500 mg by mouth 2 (two) times daily.        Marland Kitchen venlafaxine (EFFEXOR) 75 MG tablet Take 37.5 mg by mouth daily.       . vitamin C (ASCORBIC ACID) 500 MG tablet Take 250 mg by mouth daily. Takes 2 a day      . ziprasidone (GEODON) 60 MG capsule Take 60 mg by mouth at bedtime.       No current facility-administered medications on file prior to visit.    Review of Systems Review of Systems  Constitutional: Negative for fever, appetite change, and  unexpected weight change.  ENT pos for sinus pain/ pressure / congestion and ST Eyes: Negative for pain and visual disturbance.  Respiratory: Negative for cough and shortness of breath.   Cardiovascular: Negative for cp or palpitations    Gastrointestinal: Negative for nausea, diarrhea and constipation.  Genitourinary: Negative for urgency and frequency.  Skin: Negative for pallor or rash   Neurological: Negative for weakness, light-headedness, numbness and headaches.  Hematological: Negative for adenopathy. Does not bruise/bleed easily.  Psychiatric/Behavioral: Negative for dysphoric mood. The patient is not nervous/anxious.         Objective:   Physical Exam  Constitutional: She appears well-developed and well-nourished. No distress.  obese and well appearing   HENT:  Head: Normocephalic and atraumatic.  Right Ear: External ear normal.  Left Ear: External ear normal.  Mouth/Throat: Oropharynx is clear and moist. No oropharyngeal exudate.  Nares are injected and congested   Worse on L  Tender over ethmoid sinuses bilaterally R TM is mildly retracted   Eyes: Conjunctivae and EOM are normal. Pupils are equal, round, and reactive to light. Right eye exhibits no discharge. Left eye exhibits no discharge. No scleral icterus.  Neck:  Normal range of motion. Neck supple.  Cardiovascular: Regular rhythm.   Pulmonary/Chest: Effort normal and breath sounds normal. No respiratory distress. She has no wheezes. She has no rales.  Lymphadenopathy:    She has no cervical adenopathy.  Neurological: She is alert. No cranial nerve deficit.  Skin: Skin is warm and dry. No rash noted.  Psychiatric: She has a normal mood and affect.          Assessment & Plan:

## 2013-02-17 NOTE — Progress Notes (Signed)
Pre-visit discussion using our clinic review tool. No additional management support is needed unless otherwise documented below in the visit note.  

## 2013-02-17 NOTE — Telephone Encounter (Signed)
Patient on the scheduled to see Dr. Glori Bickers today.

## 2013-02-17 NOTE — Patient Instructions (Signed)
Stop up front for referral to ENT  Try to drink more water- and empty bladder when you need to  We will call you about urine test  Do saline nasal irrigations if you feel like it would help

## 2013-02-18 NOTE — Assessment & Plan Note (Signed)
Ongoing - after 2 rounds of augmentin not imp completely (though pt feels better when on the abx)  Ref to ENT Has had sinus surgery in the past

## 2013-02-18 NOTE — Assessment & Plan Note (Signed)
Neg ua - but some ketones  Suggested drinking more water and eliminating when she needs to instead of holding it  Will update if no imp

## 2013-02-23 ENCOUNTER — Telehealth: Payer: Self-pay | Admitting: Family Medicine

## 2013-02-23 NOTE — Telephone Encounter (Signed)
Pt left vm stating that she received UA results from last week via MyChart and wants to know if Dr. Glori Bickers plans to put her on abx or if she is ok without them.

## 2013-02-23 NOTE — Telephone Encounter (Signed)
I added a message to it -  If it went through- does not look infected-she just needs to drink more water (had some ketones in it)

## 2013-02-25 ENCOUNTER — Other Ambulatory Visit: Payer: Self-pay | Admitting: Family Medicine

## 2013-02-25 NOTE — Telephone Encounter (Signed)
Electronic refill request, please advise  

## 2013-02-25 NOTE — Telephone Encounter (Signed)
Left voicemail letting pt know Dr. Tower's comments  

## 2013-02-26 NOTE — Telephone Encounter (Signed)
Then please deny it - you may want to check in with the pt-thanks

## 2013-02-26 NOTE — Telephone Encounter (Signed)
?   This looks a little early- am I wrong

## 2013-02-26 NOTE — Telephone Encounter (Signed)
Yes it's not due until 03/13/13

## 2013-02-26 NOTE — Telephone Encounter (Signed)
denied °

## 2013-03-15 ENCOUNTER — Other Ambulatory Visit: Payer: Self-pay | Admitting: Family Medicine

## 2013-03-15 NOTE — Telephone Encounter (Signed)
Will refill electronically  

## 2013-03-15 NOTE — Telephone Encounter (Signed)
Pt left v/m requesting status of cyclobenzaprine refill to [facility]. Pt wants to pick up rx today and pt request cb when refilled.

## 2013-03-15 NOTE — Telephone Encounter (Signed)
Patient advised.

## 2013-03-25 ENCOUNTER — Other Ambulatory Visit: Payer: Self-pay | Admitting: Family Medicine

## 2013-03-31 ENCOUNTER — Other Ambulatory Visit: Payer: Self-pay | Admitting: Internal Medicine

## 2013-04-05 ENCOUNTER — Ambulatory Visit (INDEPENDENT_AMBULATORY_CARE_PROVIDER_SITE_OTHER): Payer: BC Managed Care – PPO | Admitting: Internal Medicine

## 2013-04-05 ENCOUNTER — Encounter: Payer: Self-pay | Admitting: Internal Medicine

## 2013-04-05 VITALS — BP 150/84 | HR 100 | Temp 98.6°F | Wt 207.0 lb

## 2013-04-05 DIAGNOSIS — F309 Manic episode, unspecified: Secondary | ICD-10-CM

## 2013-04-05 DIAGNOSIS — E890 Postprocedural hypothyroidism: Secondary | ICD-10-CM

## 2013-04-05 DIAGNOSIS — I10 Essential (primary) hypertension: Secondary | ICD-10-CM

## 2013-04-05 DIAGNOSIS — R42 Dizziness and giddiness: Secondary | ICD-10-CM

## 2013-04-05 DIAGNOSIS — F319 Bipolar disorder, unspecified: Secondary | ICD-10-CM

## 2013-04-05 LAB — T4, FREE: Free T4: 1.01 ng/dL (ref 0.60–1.60)

## 2013-04-05 LAB — TSH: TSH: 0.35 u[IU]/mL (ref 0.35–5.50)

## 2013-04-05 MED ORDER — CLONIDINE HCL 0.1 MG PO TABS: 0.1000 mg | ORAL_TABLET | Freq: Every day | ORAL | Status: DC

## 2013-04-05 NOTE — Progress Notes (Signed)
Pre visit review using our clinic review tool, if applicable. No additional management support is needed unless otherwise documented below in the visit note. 

## 2013-04-05 NOTE — Assessment & Plan Note (Signed)
Slightly elevated ? R/t manic phase of bipolar Advised her to stop taking extra Norvasc Will give 0.1 mg clonidine to take daily if elevated blood pressure, headache and dizziness  RTC in 2 weeks or sooner if needed

## 2013-04-05 NOTE — Progress Notes (Signed)
Subjective:    Patient ID: Lindsay Fry, female    DOB: [birth year], 53 y.o.   MRN: [id number]  HPI  Pt presents to the clinic today with c/o elevated blood pressure. She noticed this 2 days. She did c/o headache and dizziness. She is taking an extra 5 mg of Norvasc daily. She is on norvasc and spironolactone. The dizziness is gone, but she still feels a slight headache. Additionally , she reports she is on day 3-4 of her manic phase. She was recently started on Lamictal. She is sleeping well at night due to the Roby. She does have an appt to see her Psychiatrist tomorrow.  Review of Systems      Past Medical History  Diagnosis Date  . Rosacea   . Family history of malignant neoplasm of gastrointestinal tract   . Allergic rhinitis, cause unspecified   . Anxiety state, unspecified   . Unspecified asthma(493.90)   . Benign neoplasm of other and unspecified site of the digestive system   . Ulcerative colitis, unspecified   . Depression   . Fibromyalgia   . GERD (gastroesophageal reflux disease)   . Hiatal hernia   . Pure hypercholesterolemia   . Unspecified essential hypertension   . Unspecified hypothyroidism   . Insomnia, unspecified   . History of migraines   . Other screening mammogram   . Asymptomatic postmenopausal status (age-related) (natural)   . Unspecified vitamin D deficiency   . Edema   . Dysuria   . History of ovarian cyst   . Cyst     on Achilles tendon  . Tubular adenoma of colon 01/10/11  . Vertigo   . Subacute confusional state 11/19/2012  . ADD (attention deficit disorder) 11/19/2012  . OCD (obsessive compulsive disorder)   . Glaucoma   . History of alcoholism     Current Outpatient Prescriptions  Medication Sig Dispense Refill  . acetaminophen (TYLENOL ARTHRITIS PAIN) 650 MG CR tablet Take 1,300 mg by mouth every 8 (eight) hours as needed.        Marland Kitchen amLODipine (NORVASC) 10 MG tablet TAKE 1 TABLET BY MOUTH EVERY NIGHT AT BEDTIME  90 tablet  1    . aspirin 81 MG tablet Take 81 mg by mouth daily.      Marland Kitchen atorvastatin (LIPITOR) 40 MG tablet TAKE  1/2 TABLET BY MOUTH EVERY DAY  45 tablet  3  . b complex vitamins capsule Take 1 capsule by mouth daily.      . bisacodyl (BISACODYL) 5 MG EC tablet Take 5-10 mg by mouth at bedtime as needed for severe constipation.       Marland Kitchen buPROPion (WELLBUTRIN XL) 150 MG 24 hr tablet Take 300 mg by mouth daily.       . calcium carbonate (TUMS - DOSED IN MG ELEMENTAL CALCIUM) 500 MG chewable tablet Chew 1-2 tablets by mouth as needed for heartburn.      . chlorproMAZINE (THORAZINE) 25 MG tablet Take 25-50 mg by mouth 3 (three) times daily. For migraines if frova doesn't help.       . Cholecalciferol (VITAMIN D3) 2000 UNITS TABS Take 1 tablet by mouth daily.      . Clindamycin Phosphate foam Apply topically.       . cyclobenzaprine (FLEXERIL) 10 MG tablet TAKE 2 TABLETS BY MOUTH EVERY NIGHT AT BEDTIME  180 tablet  1  . fluticasone (FLONASE) 50 MCG/ACT nasal spray USE 2 SPRAYS IN EACH NOSTRIL TWICE DAILY  16 g  3  . folic acid (FOLVITE) 818 MCG tablet Take 400 mcg by mouth daily.      Marland Kitchen lamoTRIgine (LAMICTAL) 25 MG tablet Take 150 mg by mouth at bedtime.       Marland Kitchen latanoprost (XALATAN) 0.005 % ophthalmic solution Place 1 drop into both eyes At bedtime.      . Multiple Vitamin (MULTIVITAMIN) tablet Take 1 tablet by mouth daily.        . naratriptan (AMERGE) 2.5 MG tablet Take 2.5 mg by mouth 2 (two) times daily as needed for migraine. Take one (1) tablet at onset of headache; if returns or does not resolve, may repeat after 4 hours; do not exceed five (5) mg in 24 hours.      . nitrofurantoin (MACRODANTIN) 100 MG capsule Take 100 mg by mouth as directed. With sexual activity       . pantoprazole (PROTONIX) 40 MG tablet TAKE 1 TABLET BY MOUTH TWICE DAILY  60 tablet  0  . Probiotic Product (PROBIOTIC DAILY PO) Take by mouth.      . Simethicone (GAS-X PO) Take 1 tablet by mouth 4 (four) times daily.      Marland Kitchen  spironolactone (ALDACTONE) 25 MG tablet Take 25 mg by mouth 2 (two) times daily.        Marland Kitchen SYNTHROID 200 MCG tablet Take 1 tablet (200 mcg total) by mouth daily before breakfast.  30 tablet  5  . traMADol (ULTRAM) 50 MG tablet take 4 tabs a day      . valACYclovir (VALTREX) 500 MG tablet Take 500 mg by mouth 2 (two) times daily.        Marland Kitchen venlafaxine (EFFEXOR) 75 MG tablet Take 37.5 mg by mouth daily.       . vitamin C (ASCORBIC ACID) 500 MG tablet Take 250 mg by mouth daily. Takes 2 a day      . ziprasidone (GEODON) 60 MG capsule Take 60 mg by mouth at bedtime.      Marland Kitchen zolpidem (AMBIEN) 10 MG tablet Take 10 mg by mouth at bedtime.       No current facility-administered medications for this visit.    Allergies  Allergen Reactions  . Amphetamines     mania  . Aspirin     REACTION: aggrivates colitis  . Caffeine   . Erythromycin     REACTION: GI upset  . Nsaids     REACTION: aggrivate colitis  . Rosuvastatin     REACTION: increased lfts    Family History  Problem Relation Age of Onset  . Coronary artery disease Father   . Colon cancer Father   . Alzheimer's disease Father   . Dementia Father   . Coronary artery disease Mother   . Hypertension Mother   . Osteoporosis Mother   . Colitis Mother   . Crohn's disease Mother   . Breast cancer      great aunts  . Coronary artery disease      Uncle (also AAA)  . Diabetes      remote family history  . Stroke Cousin   . Esophageal cancer Neg Hx   . Rectal cancer Neg Hx   . Stomach cancer Neg Hx     History   Social History  . Marital Status: Married    Spouse Name: alan    Number of Children: 2  . Years of Education: college   Occupational History  . Telephone triage   .  [employer]  Social History Main Topics  . Smoking status: Former Smoker -- 0.25 packs/day for 5 years    Types: Cigarettes    Quit date: 01/14/1993  . Smokeless tobacco: Never Used  . Alcohol Use: No     Comment: Recovered ETOH  . Drug  Use: No  . Sexual Activity: Not Currently   Other Topics Concern  . 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 Denies fever, malaise, fatigue,  or abrupt weight changes.  HEENT: Denies eye pain, eye redness, ear pain, ringing in the ears, wax buildup, runny nose, nasal congestion, bloody nose, or sore throat. Respiratory: Denies difficulty breathing, shortness of breath, cough or sputum production.   Cardiovascular: Denies chest pain, chest tightness, palpitations or swelling in the hands or feet.  Neurological: Pt reports dizziness. Denies difficulty with memory, difficulty with speech or problems with balance and coordination.   No other specific complaints in a complete review of systems (except as listed in HPI above).  Objective:   Physical Exam  BP 150/84  Pulse 100  Temp(Src) 98.6 F (37 C) (Oral)  Wt 207 lb (93.895 kg) Wt Readings from Last 3 Encounters:  04/05/13 207 lb (93.895 kg)  02/17/13 201 lb (91.173 kg)  02/03/13 199 lb 12 oz (90.606 kg)    General: Appears her stated age, well developed, well nourished in NAD. Cardiovascular: Tachycardic with normal rhythm. S1,S2 noted.  No murmur, rubs or gallops noted. No JVD or BLE edema. No carotid bruits noted. Pulmonary/Chest: Normal effort and positive vesicular breath sounds. No respiratory distress. No wheezes, rales or ronchi noted.   Neurological: Alert and oriented. Cranial nerves II-XII intact. Coordination normal. +DTRs bilaterally.   BMET    Component Value Date/Time   NA 138 05/19/2012 1206   K 3.8 05/19/2012 1206   CL 104 05/19/2012 1206   CO2 26 05/19/2012 1206   GLUCOSE 132* 11/10/2012 1455   BUN 12 05/19/2012 1206   CREATININE 0.9 05/19/2012 1206   CALCIUM 9.3 05/19/2012 1206   GFRNONAA 94.17 06/22/2008 0959   GFRAA 86 06/22/2007 1043     Lipid Panel     Component Value Date/Time   CHOL 183 11/10/2012 0906   TRIG 189.0* 11/10/2012 0906   HDL 43.10 11/10/2012 0906   CHOLHDL 4 11/10/2012 0906   VLDL 37.8 11/10/2012 0906   LDLCALC 102* 11/10/2012 0906    CBC    Component Value Date/Time   WBC 9.1 05/19/2012 1206   RBC 4.82 05/19/2012 1206   HGB 14.8 05/19/2012 1206   HCT 43.3 05/19/2012 1206   PLT 421.0* 05/19/2012 1206   MCV 89.8 05/19/2012 1206   MCHC 34.2 05/19/2012 1206   RDW 13.9 05/19/2012 1206   LYMPHSABS 2.4 05/19/2012 1206   MONOABS 0.7 05/19/2012 1206   EOSABS 0.4 05/19/2012 1206   BASOSABS 0.0 05/19/2012 1206    Hgb A1C Lab Results  Component Value Date   HGBA1C 5.6 12/22/2012         Assessment & Plan:

## 2013-04-05 NOTE — Patient Instructions (Signed)

## 2013-04-06 ENCOUNTER — Telehealth: Payer: Self-pay | Admitting: Family Medicine

## 2013-04-06 ENCOUNTER — Telehealth: Payer: Self-pay

## 2013-04-06 NOTE — Telephone Encounter (Signed)
Ok to fax thyroid labs

## 2013-04-06 NOTE — Telephone Encounter (Signed)
Relevant patient education assigned to patient using Emmi. ° °

## 2013-04-06 NOTE — Telephone Encounter (Signed)
Pt request recent labs faxed to Dr Layla Barter fax # [phone]. Dr [facility] phone # if needed is [phone].

## 2013-04-08 ENCOUNTER — Encounter: Payer: Self-pay | Admitting: Endocrinology

## 2013-04-08 ENCOUNTER — Ambulatory Visit (INDEPENDENT_AMBULATORY_CARE_PROVIDER_SITE_OTHER): Payer: BC Managed Care – PPO | Admitting: Endocrinology

## 2013-04-08 VITALS — BP 130/84 | HR 106 | Temp 98.0°F | Resp 16 | Ht 66.0 in | Wt 208.0 lb

## 2013-04-08 DIAGNOSIS — R7309 Other abnormal glucose: Secondary | ICD-10-CM

## 2013-04-08 DIAGNOSIS — R7303 Prediabetes: Secondary | ICD-10-CM

## 2013-04-08 DIAGNOSIS — E890 Postprocedural hypothyroidism: Secondary | ICD-10-CM

## 2013-04-08 NOTE — Progress Notes (Signed)
Patient ID: Lindsay Fry, female   DOB: [birth year], 53 y.o.   MRN: [id number]   Reason for Appointment:  Hypothyroidism, followup visit  History of Present Illness:  The hypothyroidism was first diagnosed in 1992 after her treatment for Graves' disease with [id number] She has been on relatively large doses of thyroxine supplements She was on 224 mcg since 11/13 and her TSH was normal in 3/14 Subjectively difficult to assess her thyroid since she tends to have fatigue chronically.  In 2014 her dose was reduced to 200 mcg daily but she on her own was taking 224 mcg; with this her TSH was 0.25 and she is back on 200 mcg now Her only complaints are restlessness which he thinks is from her bipolar disorder. Feels less tired She has her usual hot flashes, no significant weight change         She is taking the tablet in the morning daily before breakfast.  PREDIABETES: She has had impaired fasting glucose and her last fasting glucose was 105, A1c was 6 normal at 5.6 She is able to maintain the level of control with usually a good diet  LABS:  Office Visit on 04/05/2013  Component Date Value Ref Range Status  . Free T4 04/05/2013 1.01  0.60 - 1.60 ng/dL Final  . TSH 04/05/2013 0.35  0.35 - 5.50 uIU/mL Final      Medication List       This list is accurate as of: 04/08/13  4:24 PM.  Always use your most recent med list.               amLODipine 10 MG tablet  Commonly known as:  NORVASC  TAKE 1 TABLET BY MOUTH EVERY NIGHT AT BEDTIME     aspirin 81 MG tablet  Take 81 mg by mouth daily.     atorvastatin 40 MG tablet  Commonly known as:  LIPITOR  TAKE  1/2 TABLET BY MOUTH EVERY DAY     b complex vitamins capsule  Take 1 capsule by mouth daily.     bisacodyl 5 MG EC tablet  Generic drug:  bisacodyl  Take 5-10 mg by mouth at bedtime as needed for severe constipation.     buPROPion 150 MG 24 hr tablet  Commonly known as:  WELLBUTRIN XL  Take 300 mg by mouth daily.     calcium carbonate 500 MG chewable tablet  Commonly known as:  TUMS - dosed in mg elemental calcium  Chew 1-2 tablets by mouth as needed for heartburn.     chlorproMAZINE 25 MG tablet  Commonly known as:  THORAZINE  Take 25-50 mg by mouth 3 (three) times daily. For migraines if frova doesn't help.     Clindamycin Phosphate foam  Apply topically.     cloNIDine 0.1 MG tablet  Commonly known as:  CATAPRES  Take 1 tablet (0.1 mg total) by mouth daily.     cyclobenzaprine 10 MG tablet  Commonly known as:  FLEXERIL  TAKE 2 TABLETS BY MOUTH EVERY NIGHT AT BEDTIME     fluticasone 50 MCG/ACT nasal spray  Commonly known as:  FLONASE  USE 2 SPRAYS IN EACH NOSTRIL TWICE DAILY     folic acid A999333 MCG tablet  Commonly known as:  FOLVITE  Take 400 mcg by mouth daily.     GAS-X PO  Take 1 tablet by mouth 4 (four) times daily.     lamoTRIgine 25 MG tablet  Commonly known as:  LAMICTAL  Take 150 mg by mouth at bedtime.     latanoprost 0.005 % ophthalmic solution  Commonly known as:  XALATAN  Place 1 drop into both eyes At bedtime.     multivitamin tablet  Take 1 tablet by mouth daily.     naratriptan 2.5 MG tablet  Commonly known as:  AMERGE  Take 2.5 mg by mouth 2 (two) times daily as needed for migraine. Take one (1) tablet at onset of headache; if returns or does not resolve, may repeat after 4 hours; do not exceed five (5) mg in 24 hours.     nitrofurantoin 100 MG capsule  Commonly known as:  MACRODANTIN  Take 100 mg by mouth as directed. With sexual activity     pantoprazole 40 MG tablet  Commonly known as:  PROTONIX  TAKE 1 TABLET BY MOUTH TWICE DAILY     PROBIOTIC DAILY PO  Take by mouth.     spironolactone 25 MG tablet  Commonly known as:  ALDACTONE  Take 25 mg by mouth 2 (two) times daily.     SYNTHROID 200 MCG tablet  Generic drug:  levothyroxine  Take 1 tablet (200 mcg total) by mouth daily before breakfast.     traMADol 50 MG tablet  Commonly known as:  ULTRAM   take 4 tabs a day     TYLENOL ARTHRITIS PAIN 650 MG CR tablet  Generic drug:  acetaminophen  Take 1,300 mg by mouth every 8 (eight) hours as needed.     valACYclovir 500 MG tablet  Commonly known as:  VALTREX  Take 500 mg by mouth 2 (two) times daily.     venlafaxine 75 MG tablet  Commonly known as:  EFFEXOR  Take 37.5 mg by mouth daily.     vitamin C 500 MG tablet  Commonly known as:  ASCORBIC ACID  Take 250 mg by mouth daily. Takes 2 a day     Vitamin D3 2000 UNITS Tabs  Take 1 tablet by mouth daily.     ziprasidone 60 MG capsule  Commonly known as:  GEODON  Take 60 mg by mouth at bedtime.     zolpidem 10 MG tablet  Commonly known as:  AMBIEN  Take 10 mg by mouth at bedtime.        Allergies:  Allergies  Allergen Reactions  . Amphetamines     mania  . Aspirin     REACTION: aggrivates colitis  . Caffeine   . Erythromycin     REACTION: GI upset  . Nsaids     REACTION: aggrivate colitis  . Rosuvastatin     REACTION: increased lfts    Past Medical History  Diagnosis Date  . Rosacea   . Family history of malignant neoplasm of gastrointestinal tract   . Allergic rhinitis, cause unspecified   . Anxiety state, unspecified   . Unspecified asthma(493.90)   . Benign neoplasm of other and unspecified site of the digestive system   . Ulcerative colitis, unspecified   . Depression   . Fibromyalgia   . GERD (gastroesophageal reflux disease)   . Hiatal hernia   . Pure hypercholesterolemia   . Unspecified essential hypertension   . Unspecified hypothyroidism   . Insomnia, unspecified   . History of migraines   . Other screening mammogram   . Asymptomatic postmenopausal status (age-related) (natural)   . Unspecified vitamin D deficiency   . Edema   . Dysuria   . History of ovarian cyst   .  Cyst     on Achilles tendon  . Tubular adenoma of colon 01/10/11  . Vertigo   . Subacute confusional state 11/19/2012  . ADD (attention deficit disorder) 11/19/2012  .  OCD (obsessive compulsive disorder)   . Glaucoma   . History of alcoholism     Past Surgical History  Procedure Laterality Date  . Wisdom tooth extraction  1980's  . Tonsillectomy  1984  . Cesarean section  1996/1997    placenta previa, gest DM, pre-eclampsia  . Nasal sinus surgery  05/1997  . Precancerous mole removed    . Cholecystectomy  1997    adhesions also  . Meckel diverticulum excision  1999  . Breast biopsy      on tamoxifen, atypical hyperplasia  . Dexa  04/1999 and 2010    normal  . Colonoscopy  01/2001    Ulcerative colitis  . Esophagogastroduodenoscopy  09/2001    polyp  . Exercise stress test  11/2003    negative  . Nuclear stress test  06/2007    negative  . Colonoscopy  04/2004    UC, polyp  . Colonoscopy  12/08    UC, no polyps  . Sleep study  11/09    no apnea, but did snore (done by [facility])  . Bunionectomy Right   . Femur fracture surgery Left     Family History  Problem Relation Age of Onset  . Coronary artery disease Father   . Colon cancer Father   . Alzheimer's disease Father   . Dementia Father   . Coronary artery disease Mother   . Hypertension Mother   . Osteoporosis Mother   . Colitis Mother   . Crohn's disease Mother   . Breast cancer      great aunts  . Coronary artery disease      Uncle (also AAA)  . Diabetes      remote family history  . Stroke Cousin   . Esophageal cancer Neg Hx   . Rectal cancer Neg Hx   . Stomach cancer Neg Hx     Social History:  reports that she quit smoking about 20 years ago. Her smoking use included Cigarettes. She has a 1.25 pack-year smoking history. She has never used smokeless tobacco. She reports that she does not drink alcohol or use illicit drugs.  REVIEW Of SYSTEMS:  She has had difficulty losing weight  Wt Readings from Last 3 Encounters:  04/08/13 208 lb (94.348 kg)  04/05/13 207 lb (93.895 kg)  02/17/13 201 lb (91.173 kg)   She is still not completely able to control her bipolar  disorder    Examination:   BP 130/84  Pulse 106  Temp(Src) 98 F (36.7 C)  Resp 16  Ht 5\' 6"  (1.676 m)  Wt 208 lb (94.348 kg)  BMI 33.59 kg/m2  SpO2 95%   GENERAL APPEARANCE:  she looks well, not unusually anxious  No puffiness of face, hands or feet  Neck shows no thyroid nodule             NEUROLOGIC EXAM:  biceps reflexes appear normal     Assessments   Hypothyroidism, post ablative:  Currently  her TSH is low normal, again her symptoms are difficult to gauge because of her anxiety issues Would like her to have a mid normal TSH to avoid effects on her anxiety Will reduce her dose to 6-1/2 tablets a week  Impaired fasting glucose: Her A1c and fasting glucose will be  reassessed on the next visit  Thor Nannini 04/08/2013, 4:24 PM

## 2013-04-08 NOTE — Patient Instructions (Signed)
Continue 200ug Synthroid but on Sunday take 1/2 tab

## 2013-04-12 ENCOUNTER — Encounter: Payer: Self-pay | Admitting: Cardiovascular Disease

## 2013-04-12 ENCOUNTER — Ambulatory Visit (INDEPENDENT_AMBULATORY_CARE_PROVIDER_SITE_OTHER): Payer: BC Managed Care – PPO | Admitting: Cardiovascular Disease

## 2013-04-12 VITALS — BP 124/78 | HR 95 | Ht 66.5 in | Wt 210.5 lb

## 2013-04-12 DIAGNOSIS — R079 Chest pain, unspecified: Secondary | ICD-10-CM

## 2013-04-12 DIAGNOSIS — I10 Essential (primary) hypertension: Secondary | ICD-10-CM

## 2013-04-12 DIAGNOSIS — M25519 Pain in unspecified shoulder: Secondary | ICD-10-CM

## 2013-04-12 DIAGNOSIS — F319 Bipolar disorder, unspecified: Secondary | ICD-10-CM

## 2013-04-12 NOTE — Assessment & Plan Note (Signed)
Blood pressure periodically running high. Recommended she takes clonidine 0.1 mg twice a day rather than in the evening. Sometimes with one a day, she may have rebound hypertension .She may want to purchase a blood pressure cuff and monitor her blood pressure at home.

## 2013-04-12 NOTE — Assessment & Plan Note (Addendum)
She denies any recent episodes of chest pain. No further workup at this time 

## 2013-04-12 NOTE — Assessment & Plan Note (Signed)
Recent diagnosis of bipolar disorder per the patient. No recent manic episodes

## 2013-04-12 NOTE — Progress Notes (Signed)
Patient ID: Lindsay Fry, female    DOB: Apr 15, 1960, 53 y.o.   MRN: [id number]  HPI Comments: Lindsay Fry is a very pleasant 53 year old woman with history of obesity, hypertension, hyperlipidemia, strong family history of heart disease, father who had bypass surgery at 10, previous symptoms of chest pain and shortness of breath. Negative stress Myoview in August 2014 showing no ischemia Also with a history of migraines  In followup today, she reports that she was recently diagnosed with bipolar disorder. Several recent manic episodes. Medications have been changed to manage this. She has had periodic left arm pain coming on at rest. This seemed to resolve with NSAIDs. Also with several episodes of higher blood pressure, possibly associated with stress. He states that she has been taking clonidine once a day in the evening, amlodipine in the morning  Vertigo symptoms occurred in early 2014 and recurrent August 9. She is taking meclizine. She has dizziness and feels that she is drunk when she stands up.  History of hyperlipidemia. In June 2010, total cholesterol 314. Most recent total cholesterol 174 Remote smoking for a short period of time, none recently.  EKG shows normal sinus rhythm with rate 95 beats per minute no significant ST or T wave changes   Outpatient Encounter Prescriptions as of 04/12/2013  Medication Sig  . acetaminophen (TYLENOL ARTHRITIS PAIN) 650 MG CR tablet Take 1,300 mg by mouth every 8 (eight) hours as needed.    Marland Kitchen amLODipine (NORVASC) 10 MG tablet TAKE 1 TABLET BY MOUTH EVERY NIGHT AT BEDTIME  . aspirin 81 MG tablet Take 81 mg by mouth daily.  Marland Kitchen atorvastatin (LIPITOR) 40 MG tablet TAKE  1/2 TABLET BY MOUTH EVERY DAY  . b complex vitamins capsule Take 1 capsule by mouth daily.  . bisacodyl (BISACODYL) 5 MG EC tablet Take 5-10 mg by mouth at bedtime as needed for severe constipation.   Marland Kitchen buPROPion (WELLBUTRIN XL) 150 MG 24 hr tablet Take 300 mg by mouth daily.   .  calcium carbonate (TUMS - DOSED IN MG ELEMENTAL CALCIUM) 500 MG chewable tablet Chew 1-2 tablets by mouth as needed for heartburn.  . chlorproMAZINE (THORAZINE) 25 MG tablet Take 25-50 mg by mouth 3 (three) times daily. For migraines if frova doesn't help.   . Cholecalciferol (VITAMIN D3) 2000 UNITS TABS Take 1 tablet by mouth daily.  . Clindamycin Phosphate foam Apply topically.   . cloNIDine (CATAPRES) 0.1 MG tablet Take 1 tablet (0.1 mg total) by mouth daily.  . cyclobenzaprine (FLEXERIL) 10 MG tablet TAKE 2 TABLETS BY MOUTH EVERY NIGHT AT BEDTIME  . fluticasone (FLONASE) 50 MCG/ACT nasal spray USE 2 SPRAYS IN EACH NOSTRIL TWICE DAILY  . folic acid (FOLVITE) 801 MCG tablet Take 400 mcg by mouth daily.  Marland Kitchen lamoTRIgine (LAMICTAL) 25 MG tablet Take 150 mg by mouth at bedtime.   Marland Kitchen latanoprost (XALATAN) 0.005 % ophthalmic solution Place 1 drop into both eyes At bedtime.  Marland Kitchen levothyroxine (SYNTHROID) 200 MCG tablet Take 200 mcg by mouth daily before breakfast. As directed.  . Multiple Vitamin (MULTIVITAMIN) tablet Take 1 tablet by mouth daily.    . naratriptan (AMERGE) 2.5 MG tablet Take 2.5 mg by mouth 2 (two) times daily as needed for migraine. Take one (1) tablet at onset of headache; if returns or does not resolve, may repeat after 4 hours; do not exceed five (5) mg in 24 hours.  . nitrofurantoin (MACRODANTIN) 100 MG capsule Take 100 mg by mouth as directed. With  sexual activity   . pantoprazole (PROTONIX) 40 MG tablet TAKE 1 TABLET BY MOUTH TWICE DAILY  . Probiotic Product (PROBIOTIC DAILY PO) Take by mouth.  . Simethicone (GAS-X PO) Take 1 tablet by mouth 4 (four) times daily.  Marland Kitchen spironolactone (ALDACTONE) 25 MG tablet Take 25 mg by mouth 2 (two) times daily.    . traMADol (ULTRAM) 50 MG tablet take 4 tabs a day  . valACYclovir (VALTREX) 500 MG tablet Take 500 mg by mouth 2 (two) times daily.    Marland Kitchen venlafaxine (EFFEXOR) 75 MG tablet Take 37.5 mg by mouth daily.   . vitamin C (ASCORBIC ACID) 500  MG tablet Take 250 mg by mouth daily. Takes 2 a day  . ziprasidone (GEODON) 60 MG capsule Take 60 mg by mouth at bedtime.  Marland Kitchen zolpidem (AMBIEN) 10 MG tablet Take 10 mg by mouth at bedtime.  . [DISCONTINUED] SYNTHROID 200 MCG tablet Take 1 tablet (200 mcg total) by mouth daily before breakfast.     Review of Systems  Constitutional: Negative.   HENT: Negative.   Eyes: Negative.   Respiratory: Negative.   Cardiovascular: Negative.   Gastrointestinal: Negative.   Endocrine: Negative.   Musculoskeletal: Positive for gait problem.  Skin: Negative.   Allergic/Immunologic: Negative.   Neurological: Positive for dizziness.  Hematological: Negative.   Psychiatric/Behavioral: Negative.   All other systems reviewed and are negative.    BP 124/78  Pulse 95  Ht 5' 6.5" (1.689 m)  Wt 210 lb 8 oz (95.482 kg)  BMI 33.47 kg/m2  Physical Exam  Nursing note and vitals reviewed. Constitutional: She is oriented to person, place, and time. She appears well-developed and well-nourished.  HENT:  Head: Normocephalic.  Nose: Nose normal.  Mouth/Throat: Oropharynx is clear and moist.  Eyes: Conjunctivae are normal. Pupils are equal, round, and reactive to light.  Neck: Normal range of motion. Neck supple. No JVD present.  Cardiovascular: Normal rate, regular rhythm, S1 normal, S2 normal, normal heart sounds and intact distal pulses.  Exam reveals no gallop and no friction rub.   No murmur heard. Pulmonary/Chest: Effort normal and breath sounds normal. No respiratory distress. She has no wheezes. She has no rales. She exhibits no tenderness.  Abdominal: Soft. Bowel sounds are normal. She exhibits no distension. There is no tenderness.  Musculoskeletal: Normal range of motion. She exhibits no edema and no tenderness.  Lymphadenopathy:    She has no cervical adenopathy.  Neurological: She is alert and oriented to person, place, and time. Coordination normal.  Skin: Skin is warm and dry. No rash  noted. No erythema.  Psychiatric: She has a normal mood and affect. Her behavior is normal. Judgment and thought content normal.    Assessment and Plan

## 2013-04-12 NOTE — Patient Instructions (Addendum)
You are doing wel No medication changes were made.  Please monitor your blood pressure at home, Call the office with numbers  Please call us if you have new issues that need to be addressed before your next appt.  Your physician wants you to follow-up in: 12 months.  You will receive a reminder letter in the mail two months in advance. If you don't receive a letter, please call our office to schedule the follow-up appointment.

## 2013-04-15 NOTE — Telephone Encounter (Signed)
Labs faxed as instructed

## 2013-04-28 ENCOUNTER — Other Ambulatory Visit: Payer: Self-pay | Admitting: Internal Medicine

## 2013-05-03 ENCOUNTER — Other Ambulatory Visit: Payer: Self-pay | Admitting: *Deleted

## 2013-05-03 ENCOUNTER — Other Ambulatory Visit: Payer: Self-pay | Admitting: Internal Medicine

## 2013-05-03 MED ORDER — PANTOPRAZOLE SODIUM 40 MG PO TBEC: 40.0000 mg | DELAYED_RELEASE_TABLET | Freq: Two times a day (BID) | ORAL | Status: DC

## 2013-05-04 NOTE — Telephone Encounter (Signed)
She saw cardiology and they inc dose to bid Will refill electronically

## 2013-05-04 NOTE — Telephone Encounter (Signed)
Dr. Tower pt 

## 2013-05-04 NOTE — Telephone Encounter (Signed)
See prev note, please advise  

## 2013-05-04 NOTE — Telephone Encounter (Signed)
i saw that this pt did not f/u up with you--but i did look in cardiology note where Dr states pt should be taking clonidine 0.1 BID--please advise

## 2013-05-12 ENCOUNTER — Encounter: Payer: Self-pay | Admitting: *Deleted

## 2013-05-12 ENCOUNTER — Other Ambulatory Visit (INDEPENDENT_AMBULATORY_CARE_PROVIDER_SITE_OTHER): Payer: BC Managed Care – PPO

## 2013-05-12 ENCOUNTER — Telehealth: Payer: Self-pay | Admitting: Family Medicine

## 2013-05-12 DIAGNOSIS — E559 Vitamin D deficiency, unspecified: Secondary | ICD-10-CM

## 2013-05-12 DIAGNOSIS — Z0000 Encounter for general adult medical examination without abnormal findings: Secondary | ICD-10-CM

## 2013-05-12 DIAGNOSIS — E890 Postprocedural hypothyroidism: Secondary | ICD-10-CM

## 2013-05-12 DIAGNOSIS — R5383 Other fatigue: Secondary | ICD-10-CM

## 2013-05-12 DIAGNOSIS — E7800 Pure hypercholesterolemia, unspecified: Secondary | ICD-10-CM

## 2013-05-12 DIAGNOSIS — R7301 Impaired fasting glucose: Secondary | ICD-10-CM

## 2013-05-12 DIAGNOSIS — I10 Essential (primary) hypertension: Secondary | ICD-10-CM

## 2013-05-12 DIAGNOSIS — R5381 Other malaise: Secondary | ICD-10-CM

## 2013-05-12 LAB — CBC WITH DIFFERENTIAL/PLATELET
Basophils Absolute: 0.1 10*3/uL (ref 0.0–0.1)
Basophils Relative: 0.7 % (ref 0.0–3.0)
Eosinophils Absolute: 0.4 10*3/uL (ref 0.0–0.7)
Eosinophils Relative: 3.8 % (ref 0.0–5.0)
HCT: 43.5 % (ref 36.0–46.0)
Hemoglobin: 14.7 g/dL (ref 12.0–15.0)
Lymphocytes Relative: 28 % (ref 12.0–46.0)
Lymphs Abs: 2.6 10*3/uL (ref 0.7–4.0)
MCHC: 33.8 g/dL (ref 30.0–36.0)
MCV: 90.3 fl (ref 78.0–100.0)
Monocytes Absolute: 0.7 10*3/uL (ref 0.1–1.0)
Monocytes Relative: 8.1 % (ref 3.0–12.0)
Neutro Abs: 5.5 10*3/uL (ref 1.4–7.7)
Neutrophils Relative %: 59.4 % (ref 43.0–77.0)
Platelets: 381 10*3/uL (ref 150.0–400.0)
RBC: 4.82 Mil/uL (ref 3.87–5.11)
RDW: 13.4 % (ref 11.5–14.6)
WBC: 9.2 10*3/uL (ref 4.5–10.5)

## 2013-05-12 LAB — LIPID PANEL
Cholesterol: 193 mg/dL (ref 0–200)
HDL: 47.6 mg/dL (ref >39.00)
LDL Cholesterol: 112 mg/dL — ABNORMAL HIGH (ref 0–99)
Total CHOL/HDL Ratio: 4
Triglycerides: 169 mg/dL — ABNORMAL HIGH (ref 0.0–149.0)
VLDL: 33.8 mg/dL (ref 0.0–40.0)

## 2013-05-12 LAB — TSH: TSH: 8.09 u[IU]/mL — ABNORMAL HIGH (ref 0.35–5.50)

## 2013-05-12 LAB — COMPREHENSIVE METABOLIC PANEL
ALT: 24 U/L (ref 0–35)
AST: 22 U/L (ref 0–37)
Albumin: 4.2 g/dL (ref 3.5–5.2)
Alkaline Phosphatase: 68 U/L (ref 39–117)
BUN: 14 mg/dL (ref 6–23)
CO2: 30 mEq/L (ref 19–32)
Calcium: 9.7 mg/dL (ref 8.4–10.5)
Chloride: 103 mEq/L (ref 96–112)
Creatinine, Ser: 0.8 mg/dL (ref 0.4–1.2)
GFR: 75.91 mL/min (ref >60.00)
Glucose, Bld: 108 mg/dL — ABNORMAL HIGH (ref 70–99)
Potassium: 4 mEq/L (ref 3.5–5.1)
Sodium: 141 mEq/L (ref 135–145)
Total Bilirubin: 0.3 mg/dL (ref 0.3–1.2)
Total Protein: 7.3 g/dL (ref 6.0–8.3)

## 2013-05-12 NOTE — Telephone Encounter (Signed)
Message copied by Abner Greenspan on Wed May 12, 2013  1:10 AM ------      Message from: [name]      Created: Tue May 04, 2013 10:42 AM      Regarding: Lab orders for Wednesday, 4.29.15       Patient is scheduled for CPX labs, please order future labs, Thanks , Terri       ------

## 2013-05-12 NOTE — Telephone Encounter (Signed)
Relevant patient education assigned to patient using Emmi. ° °

## 2013-05-13 LAB — VITAMIN D 25 HYDROXY (VIT D DEFICIENCY, FRACTURES): Vit D, 25-Hydroxy: 44 ng/mL (ref 30–89)

## 2013-05-19 ENCOUNTER — Encounter: Payer: Self-pay | Admitting: Family Medicine

## 2013-05-19 ENCOUNTER — Ambulatory Visit (INDEPENDENT_AMBULATORY_CARE_PROVIDER_SITE_OTHER): Payer: BC Managed Care – PPO | Admitting: Family Medicine

## 2013-05-19 VITALS — BP 126/86 | HR 96 | Temp 98.6°F | Ht 65.25 in | Wt 213.5 lb

## 2013-05-19 DIAGNOSIS — E890 Postprocedural hypothyroidism: Secondary | ICD-10-CM

## 2013-05-19 DIAGNOSIS — Z0000 Encounter for general adult medical examination without abnormal findings: Secondary | ICD-10-CM

## 2013-05-19 DIAGNOSIS — E7800 Pure hypercholesterolemia, unspecified: Secondary | ICD-10-CM

## 2013-05-19 DIAGNOSIS — E669 Obesity, unspecified: Secondary | ICD-10-CM

## 2013-05-19 DIAGNOSIS — I10 Essential (primary) hypertension: Secondary | ICD-10-CM

## 2013-05-19 DIAGNOSIS — E559 Vitamin D deficiency, unspecified: Secondary | ICD-10-CM

## 2013-05-19 MED ORDER — ATORVASTATIN CALCIUM 40 MG PO TABS: ORAL_TABLET | ORAL | Status: DC

## 2013-05-19 MED ORDER — AMLODIPINE BESYLATE 10 MG PO TABS: ORAL_TABLET | ORAL | Status: DC

## 2013-05-19 NOTE — Patient Instructions (Signed)
Your tsh is a little high - follow up with Dr Dwyane Dee Also continue your psychiatry follow ups Try to push through the fatigue and do some exercise - this will help mood and also weight  Eat healthy - and work on weight loss - try to distract yourself through sugar cravings   Take care of yourself

## 2013-05-19 NOTE — Progress Notes (Signed)
Subjective:    Patient ID: Lindsay Fry, female    DOB: [birth year], 53 y.o.   MRN: [id number]  HPI Here for health maintenance exam and to review chronic medical problems    Doing pretty well except for back pain   Wt is up 3 lb with bmi of 35- has gained 18 lb this year -new medicine and adj her thyroid   Hypothyroid-saw Dr Dwyane Dee and synthroid was decreased - bp was high and manic  Post ablative hypothyroidism  Also put on clonidine for the bp  Lab Results  Component Value Date   TSH 8.09* 05/12/2013   that was here  Also back on the trazadone  Also another new med - to control mania - so now depression is a bit worse     Flu shot 11/14 Mammogram 3/15 - normal , does not do self exams (has had a precancerous lump in the past in duct and she had to take tamoxifen  Pap/gyn exam - 3/15 Dr Laurey Morale  colonosc 12/12 polyp Dr Olevia Perches - 6 year recall - watches her colitis now -in remission Td 11/12   Trying to eat better  A lot of salads  But her sugar craving is worse with latest med -has to deal with that  Glucose is 108 - Dr Dwyane Dee watches her glucose tol   For exercise - she can pedal a bike and has a treadmill -not a lot of motivation to do it   bp is stable today  No cp or palpitations or headaches or edema  No side effects to medicines  BP Readings from Last 3 Encounters:  05/19/13 126/86  04/12/13 124/78  04/08/13 130/84      Results for orders placed in visit on 05/12/13  CBC WITH DIFFERENTIAL      Result Value Ref Range   WBC 9.2  4.5 - 10.5 K/uL   RBC 4.82  3.87 - 5.11 Mil/uL   Hemoglobin 14.7  12.0 - 15.0 g/dL   HCT 43.5  36.0 - 46.0 %   MCV 90.3  78.0 - 100.0 fl   MCHC 33.8  30.0 - 36.0 g/dL   RDW 13.4  11.5 - 14.6 %   Platelets 381.0  150.0 - 400.0 K/uL   Neutrophils Relative % 59.4  43.0 - 77.0 %   Lymphocytes Relative 28.0  12.0 - 46.0 %   Monocytes Relative 8.1  3.0 - 12.0 %   Eosinophils Relative 3.8  0.0 - 5.0 %   Basophils Relative 0.7  0.0  - 3.0 %   Neutro Abs 5.5  1.4 - 7.7 K/uL   Lymphs Abs 2.6  0.7 - 4.0 K/uL   Monocytes Absolute 0.7  0.1 - 1.0 K/uL   Eosinophils Absolute 0.4  0.0 - 0.7 K/uL   Basophils Absolute 0.1  0.0 - 0.1 K/uL  COMPREHENSIVE METABOLIC PANEL      Result Value Ref Range   Sodium 141  135 - 145 mEq/L   Potassium 4.0  3.5 - 5.1 mEq/L   Chloride 103  96 - 112 mEq/L   CO2 30  19 - 32 mEq/L   Glucose, Bld 108 (*) 70 - 99 mg/dL   BUN 14  6 - 23 mg/dL   Creatinine, Ser 0.8  0.4 - 1.2 mg/dL   Total Bilirubin 0.3  0.3 - 1.2 mg/dL   Alkaline Phosphatase 68  39 - 117 U/L   AST 22  0 - 37 U/L   ALT  24  0 - 35 U/L   Total Protein 7.3  6.0 - 8.3 g/dL   Albumin 4.2  3.5 - 5.2 g/dL   Calcium 9.7  8.4 - 29.9 mg/dL   GFR 24.26  >83.41 mL/min  LIPID PANEL      Result Value Ref Range   Cholesterol 193  0 - 200 mg/dL   Triglycerides 962.2 (*) 0.0 - 149.0 mg/dL   HDL 29.79  >89.21 mg/dL   VLDL 19.4  0.0 - 17.4 mg/dL   LDL Cholesterol 081 (*) 0 - 99 mg/dL   Total CHOL/HDL Ratio 4    TSH      Result Value Ref Range   TSH 8.09 (*) 0.35 - 5.50 uIU/mL  VITAMIN D 25 HYDROXY      Result Value Ref Range   Vit D, 25-Hydroxy 44  30 - 89 ng/mL    D level is good   Patient Active Problem List   Diagnosis Date Noted  . Bladder pain 02/17/2013  . Subacute confusional state 11/19/2012  . ADD (attention deficit disorder) 11/19/2012  . IFG (impaired fasting glucose) 11/12/2012  . Shortness of breath 09/02/2012  . Chest pain 08/05/2012  . Urinary frequency 08/05/2012  . Low back pain 08/05/2012  . 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 Chronic sinusitis 01/24/2012  . Vertigo, benign positional 01/24/2012  . Other screening mammogram 12/05/2010  . Routine general medical examination at a health care facility 10/14/2010  . FATIGUE 08/09/2009  . POSTMENOPAUSAL STATUS 06/22/2008  . UNSPECIFIED VITAMIN D DEFICIENCY 03/18/2008  . BENIGN NEOPLASM OTH&UNSPEC SITE DIGESTIVE SYSTEM 01/09/2007  . HIATAL HERNIA  01/09/2007  . COLONIC POLYPS, ADENOMATOUS, HX OF 01/09/2007  . HYPERCHOLESTEROLEMIA 01/07/2007  . Bipolar disorder, unspecified 01/07/2007  . DEPRESSION 01/07/2007  . HYPERTENSION 01/07/2007  . ALLERGIC RHINITIS 01/07/2007  . ASTHMA 01/07/2007  . GERD 01/07/2007  . COLITIS, ULCERATIVE 01/07/2007  . ACNE ROSACEA 01/07/2007  . MIGRAINES, HX OF 01/07/2007  . FIBROMYALGIA 10/30/2006  . INSOMNIA 10/30/2006   Past Medical History  Diagnosis Date  . Rosacea   . Family history of malignant neoplasm of gastrointestinal tract   . Allergic rhinitis, cause unspecified   . Anxiety state, unspecified   . Unspecified asthma(493.90)   . Benign neoplasm of other and unspecified site of the digestive system   . Ulcerative colitis, unspecified   . Depression   . Fibromyalgia   . GERD (gastroesophageal reflux disease)   . Hiatal hernia   . Pure hypercholesterolemia   . Unspecified essential hypertension   . Unspecified hypothyroidism   . Insomnia, unspecified   . History of migraines   . Other screening mammogram   . Asymptomatic postmenopausal status (age-related) (natural)   . Unspecified vitamin D deficiency   . Edema   . Dysuria   . History of ovarian cyst   . Cyst     on Achilles tendon  . Tubular adenoma of colon 01/10/11  . Vertigo   . Subacute confusional state 11/19/2012  . ADD (attention deficit disorder) 11/19/2012  . OCD (obsessive compulsive disorder)   . Glaucoma   . History of alcoholism   . Bipolar disorder    Past Surgical History  Procedure Laterality Date  . Wisdom tooth extraction  1980's  . Tonsillectomy  1984  . Cesarean section  1996/1997    placenta previa, gest DM, pre-eclampsia  . Nasal sinus surgery  05/1997  . Precancerous mole removed    . Cholecystectomy  1997  adhesions also  . Meckel diverticulum excision  1999  . Breast biopsy      on tamoxifen, atypical hyperplasia  . Dexa  04/1999 and 2010    normal  . Colonoscopy  01/2001    Ulcerative  colitis  . Esophagogastroduodenoscopy  09/2001    polyp  . Exercise stress test  11/2003    negative  . Nuclear stress test  06/2007    negative  . Colonoscopy  04/2004    [facility], polyp  . Colonoscopy  12/08    [facility], no polyps  . Sleep study  11/09    no apnea, but did snore (done by [facility])  . Bunionectomy Right   . Femur fracture surgery Left    History  Substance Use Topics  . Smoking status: Former Smoker -- 0.25 packs/day for 5 years    Types: Cigarettes    Quit date: 01/14/1993  . Smokeless tobacco: Never Used  . Alcohol Use: No     Comment: Recovered ETOH   Family History  Problem Relation Age of Onset  . Coronary artery disease Father   . Colon cancer Father   . Alzheimer's disease Father   . Dementia Father   . Coronary artery disease Mother   . Hypertension Mother   . Osteoporosis Mother   . Colitis Mother   . Crohn's disease Mother   . Breast cancer      great aunts  . Coronary artery disease      Uncle (also AAA)  . Diabetes      remote family history  . Stroke Cousin   . Esophageal cancer Neg Hx   . Rectal cancer Neg Hx   . Stomach cancer Neg Hx    Allergies  Allergen Reactions  . Amphetamines     mania  . Aspirin     REACTION: aggrivates colitis  . Caffeine   . Erythromycin     REACTION: GI upset  . Nsaids     REACTION: aggrivate colitis  . Rosuvastatin     REACTION: increased lfts   Current Outpatient Prescriptions on File Prior to Visit  Medication Sig Dispense Refill  . acetaminophen (TYLENOL ARTHRITIS PAIN) 650 MG CR tablet Take 1,300 mg by mouth every 8 (eight) hours as needed.        Marland Kitchen amLODipine (NORVASC) 10 MG tablet TAKE 1 TABLET BY MOUTH EVERY NIGHT AT BEDTIME  90 tablet  1  . aspirin 81 MG tablet Take 81 mg by mouth daily.      Marland Kitchen atorvastatin (LIPITOR) 40 MG tablet TAKE  1/2 TABLET BY MOUTH EVERY DAY  45 tablet  3  . b complex vitamins capsule Take 1 capsule by mouth daily.      . bisacodyl (BISACODYL) 5 MG EC tablet Take 5-10 mg  by mouth at bedtime as needed for severe constipation.       Marland Kitchen buPROPion (WELLBUTRIN XL) 150 MG 24 hr tablet Take 300 mg by mouth daily.       . calcium carbonate (TUMS - DOSED IN MG ELEMENTAL CALCIUM) 500 MG chewable tablet Chew 1-2 tablets by mouth as needed for heartburn.      . chlorproMAZINE (THORAZINE) 25 MG tablet Take 25-50 mg by mouth 3 (three) times daily. For migraines if frova doesn't help.       . Cholecalciferol (VITAMIN D3) 2000 UNITS TABS Take 1 tablet by mouth daily.      . Clindamycin Phosphate foam Apply topically.       Marland Kitchen  cyclobenzaprine (FLEXERIL) 10 MG tablet TAKE 2 TABLETS BY MOUTH EVERY NIGHT AT BEDTIME  180 tablet  1  . fluticasone (FLONASE) 50 MCG/ACT nasal spray USE 2 SPRAYS IN EACH NOSTRIL TWICE DAILY  16 g  3  . folic acid (FOLVITE) 093 MCG tablet Take 400 mcg by mouth daily.      Marland Kitchen lamoTRIgine (LAMICTAL) 25 MG tablet Take 150 mg by mouth at bedtime.       Marland Kitchen latanoprost (XALATAN) 0.005 % ophthalmic solution Place 1 drop into both eyes At bedtime.      Marland Kitchen levothyroxine (SYNTHROID) 200 MCG tablet Take 200 mcg by mouth. 6 days a week      . Multiple Vitamin (MULTIVITAMIN) tablet Take 1 tablet by mouth daily.        . naratriptan (AMERGE) 2.5 MG tablet Take 2.5 mg by mouth 2 (two) times daily as needed for migraine. Take one (1) tablet at onset of headache; if returns or does not resolve, may repeat after 4 hours; do not exceed five (5) mg in 24 hours.      . nitrofurantoin (MACRODANTIN) 100 MG capsule Take 100 mg by mouth as directed. With sexual activity       . pantoprazole (PROTONIX) 40 MG tablet Take 1 tablet (40 mg total) by mouth 2 (two) times daily.  60 tablet  0  . Probiotic Product (PROBIOTIC DAILY PO) Take by mouth.      . Simethicone (GAS-X PO) Take 1 tablet by mouth 4 (four) times daily.      Marland Kitchen spironolactone (ALDACTONE) 25 MG tablet Take 25 mg by mouth 2 (two) times daily.        . traMADol (ULTRAM) 50 MG tablet take 4 tabs a day      . valACYclovir (VALTREX)  500 MG tablet Take 500 mg by mouth 2 (two) times daily.        Marland Kitchen venlafaxine (EFFEXOR) 75 MG tablet Take 37.5 mg by mouth daily.       . vitamin C (ASCORBIC ACID) 500 MG tablet Take 250 mg by mouth daily. Takes 2 a day      . zolpidem (AMBIEN) 10 MG tablet Take 10 mg by mouth at bedtime.       No current facility-administered medications on file prior to visit.     Review of Systems    Review of Systems  Constitutional: Negative for fever, appetite change, and unexpected weight change.  Eyes: Negative for pain and visual disturbance.  Respiratory: Negative for cough and shortness of breath.   Cardiovascular: Negative for cp or palpitations    Gastrointestinal: Negative for nausea,pos for IBS  Genitourinary: Negative for urgency and frequency.  Skin: Negative for pallor or rash   MSK pos for chronic myofasical pain  Neurological: Negative for weakness, light-headedness, numbness and headaches.  Hematological: Negative for adenopathy. Does not bruise/bleed easily.  Psychiatric/Behavioral: pos for bipolar with labile mood that is imp and no SI      Objective:   Physical Exam  Constitutional: She appears well-developed and well-nourished. No distress.  obese and well appearing   HENT:  Head: Normocephalic and atraumatic.  Right Ear: External ear normal.  Left Ear: External ear normal.  Nose: Nose normal.  Mouth/Throat: Oropharynx is clear and moist.  Eyes: Conjunctivae and EOM are normal. Pupils are equal, round, and reactive to light. Right eye exhibits no discharge. Left eye exhibits no discharge. No scleral icterus.  Neck: Normal range of motion. Neck supple. No  JVD present. No thyromegaly present.  Cardiovascular: Normal rate, regular rhythm, normal heart sounds and intact distal pulses.  Exam reveals no gallop.   Pulmonary/Chest: Effort normal and breath sounds normal. No respiratory distress. She has no wheezes. She has no rales.  Abdominal: Soft. Bowel sounds are normal. She  exhibits no distension and no mass. There is no tenderness.  Musculoskeletal: She exhibits tenderness. She exhibits no edema.  Lymphadenopathy:    She has no cervical adenopathy.  Neurological: She is alert. She has normal reflexes. No cranial nerve deficit. She exhibits normal muscle tone. Coordination normal.  Skin: Skin is warm and dry. No rash noted. No erythema. No pallor.  Psychiatric: Her speech is normal. Thought content normal. Her affect is blunt. She is slowed. She exhibits a depressed mood.          Assessment & Plan:

## 2013-05-19 NOTE — Progress Notes (Signed)
Pre visit review using our clinic review tool, if applicable. No additional management support is needed unless otherwise documented below in the visit note. 

## 2013-05-20 DIAGNOSIS — Z6836 Body mass index (BMI) 36.0-36.9, adult: Secondary | ICD-10-CM

## 2013-05-20 NOTE — Assessment & Plan Note (Signed)
bp in fair control at this time  BP Readings from Last 1 Encounters:  05/19/13 126/86   No changes needed Disc lifstyle change with low sodium diet and exercise  Labs rev

## 2013-05-20 NOTE — Assessment & Plan Note (Signed)
Reviewed health habits including diet and exercise and skin cancer prevention Reviewed appropriate screening tests for age  Also reviewed health mt list, fam hx and immunization status , as well as social and family history   See HPI Lab rev  

## 2013-05-20 NOTE — Assessment & Plan Note (Signed)
Lab Results  Component Value Date   TSH 8.09* 05/12/2013   Pt will take report to Dr Dwyane Dee

## 2013-05-20 NOTE — Assessment & Plan Note (Signed)
Disc goals for lipids and reasons to control them Rev labs with pt Rev low sat fat diet in detail   

## 2013-05-20 NOTE — Assessment & Plan Note (Signed)
Vitamin D level is therapeutic with current supplementation Disc importance of this to bone and overall health

## 2013-05-31 ENCOUNTER — Encounter: Payer: Self-pay | Admitting: Family Medicine

## 2013-06-04 ENCOUNTER — Ambulatory Visit (INDEPENDENT_AMBULATORY_CARE_PROVIDER_SITE_OTHER): Payer: BC Managed Care – PPO | Admitting: Internal Medicine

## 2013-06-04 ENCOUNTER — Encounter: Payer: Self-pay | Admitting: Internal Medicine

## 2013-06-04 VITALS — BP 126/82 | HR 88 | Ht 62.5 in | Wt 217.6 lb

## 2013-06-04 DIAGNOSIS — K5100 Ulcerative (chronic) pancolitis without complications: Secondary | ICD-10-CM

## 2013-06-04 DIAGNOSIS — K219 Gastro-esophageal reflux disease without esophagitis: Secondary | ICD-10-CM

## 2013-06-04 MED ORDER — PANTOPRAZOLE SODIUM 40 MG PO TBEC: 40.0000 mg | DELAYED_RELEASE_TABLET | Freq: Two times a day (BID) | ORAL | Status: DC

## 2013-06-04 NOTE — Patient Instructions (Addendum)
We have sent the following medications to your pharmacy for you to pick up at your convenience: Protonix.   You will be due for a recall colonoscopy in 12/2014. We will send you a reminder in the mail when it gets closer to that time. Dr Glori Bickers

## 2013-06-04 NOTE — Progress Notes (Signed)
Lindsay Fry 11/16/1960 [id number]  Note: This dictation was prepared with Dragon digital system. Any transcriptional errors that result from this procedure are unintentional.   History of Present Illness: This is a 52 year old white female with ulcerative colitis  since 34 and family history of colon cancer in her father.and inflammatory bowel disease in her mother who is our patient. Last colonoscopy in December 2012 showed patchy colitis and tubular adenoma. Prior colonoscopy in 2008  showed mild colon dysplasia . She has a history of Meckel's diverticulectomy in 1999. Upper endoscopy in September 2003 for gastroesophageal reflux showed small hiatal hernia and bile reflux. She needs a refill on pantoprazole 40 mg . She had a prior cholecystectomy in 1997. Her most recent hemoglobin was 14.7 hematocrit 43.4 and 5. She is on no medications for her colitis. She was just diagnosed with bipolar disorder she has a history of depression    Past Medical History  Diagnosis Date  . Rosacea   . Family history of malignant neoplasm of gastrointestinal tract   . Allergic rhinitis, cause unspecified   . Anxiety state, unspecified   . Unspecified asthma(493.90)   . Benign neoplasm of other and unspecified site of the digestive system   . Ulcerative colitis, unspecified   . Depression   . Fibromyalgia   . GERD (gastroesophageal reflux disease)   . Hiatal hernia   . Pure hypercholesterolemia   . Unspecified essential hypertension   . Unspecified hypothyroidism   . Insomnia, unspecified   . History of migraines   . Other screening mammogram   . Asymptomatic postmenopausal status (age-related) (natural)   . Unspecified vitamin D deficiency   . Edema   . Dysuria   . History of ovarian cyst   . Cyst     on Achilles tendon  . Tubular adenoma of colon 01/10/11  . Vertigo   . Subacute confusional state 11/19/2012  . ADD (attention deficit disorder) 11/19/2012  . OCD (obsessive compulsive  disorder)   . Glaucoma   . History of alcoholism   . Bipolar disorder     Past Surgical History  Procedure Laterality Date  . Wisdom tooth extraction  1980's  . Tonsillectomy  1984  . Cesarean section  1996/1997    placenta previa, gest DM, pre-eclampsia  . Nasal sinus surgery  05/1997  . Precancerous mole removed    . Cholecystectomy  1997    adhesions also  . Meckel diverticulum excision  1999  . Breast biopsy      on tamoxifen, atypical hyperplasia  . Dexa  04/1999 and 2010    normal  . Colonoscopy  01/2001    Ulcerative colitis  . Esophagogastroduodenoscopy  09/2001    polyp  . Exercise stress test  11/2003    negative  . Nuclear stress test  06/2007    negative  . Colonoscopy  04/2004    UC, polyp  . Colonoscopy  12/08    UC, no polyps  . Sleep study  11/09    no apnea, but did snore (done by [facility])  . Bunionectomy Right   . Femur fracture surgery Left     Allergies  Allergen Reactions  . Amphetamines     mania  . Aspirin     REACTION: aggrivates colitis  . Caffeine   . Erythromycin     REACTION: GI upset  . Nsaids     REACTION: aggrivate colitis  . Rosuvastatin     REACTION: increased lfts  Family history and social history have been reviewed.  Review of Systems: Negative for rectal bleeding diarrhea. Occasional constipation  The remainder of the 10 point ROS is negative except as outlined in the H&P  Physical Exam: General Appearance Well developed, in no distress, overweight Eyes  Non icteric  HEENT  Non traumatic, normocephalic  Mouth No lesion, tongue papillated, no cheilosis Neck Supple without adenopathy, thyroid not enlarged, no carotid bruits, no JVD Lungs Clear to auscultation bilaterally COR Normal S1, normal S2, regular rhythm, no murmur, quiet precordium Abdomen protuberant soft abdomen with normoactive bowel sounds. Minimal tenderness right lower quadrant. The liver edge at costal margin Rectal soft Hemoccult negative  stool Extremities  No pedal edema Skin No lesions Neurological Alert and oriented x 3 Psychological Normal mood and affect  Assessment and Plan:   53 year old white female with ulcerative colitis of all 30 years duration. She had mild dysplasia in 2008 but no dysplasia in 2012. She will be due for colonoscopy in December 2016. She is currently on no medications and she is Hemoccult-negative. She has mild constipation for which she takes stool softeners. I suggested the magnesium oxide or fiber supplements.  Gastroesophageal reflux under control with pantoprazole 40 mg daily. We will refill    [place] Dragon 06/04/2013

## 2013-06-05 ENCOUNTER — Other Ambulatory Visit: Payer: Self-pay | Admitting: Family Medicine

## 2013-06-08 ENCOUNTER — Telehealth: Payer: Self-pay | Admitting: Internal Medicine

## 2013-06-08 NOTE — Telephone Encounter (Signed)
I would start with 2/day, may go up to 2 bid if necessary.

## 2013-06-08 NOTE — Telephone Encounter (Signed)
Rx called in as prescribed 

## 2013-06-08 NOTE — Telephone Encounter (Signed)
Dr Olevia Perches, patient states that she has been taking pericolace, 7 tablets daily but was recently advised by [place] to take magnesium oxide instead. How many magnesium oxide would you like her to take daily?

## 2013-06-08 NOTE — Telephone Encounter (Signed)
Px written for call in   

## 2013-06-08 NOTE — Telephone Encounter (Signed)
Electronic refill request, please advise  

## 2013-06-09 ENCOUNTER — Encounter: Payer: Self-pay | Admitting: Family Medicine

## 2013-06-09 NOTE — Telephone Encounter (Signed)
I have advised patient of Dr Nichola Sizer recommendations. She verbalizes understanding.

## 2013-06-11 ENCOUNTER — Telehealth: Payer: Self-pay | Admitting: Endocrinology

## 2013-06-11 NOTE — Telephone Encounter (Signed)
TSH 8.1. She is taking 6.5 tablets a week of levothyroxine Needs to increase it back to one tablet daily

## 2013-06-11 NOTE — Telephone Encounter (Signed)
Results/instructions given to patient

## 2013-07-19 ENCOUNTER — Telehealth: Payer: Self-pay | Admitting: Endocrinology

## 2013-07-26 ENCOUNTER — Ambulatory Visit (INDEPENDENT_AMBULATORY_CARE_PROVIDER_SITE_OTHER): Payer: BC Managed Care – PPO | Admitting: Family Medicine

## 2013-07-26 ENCOUNTER — Encounter: Payer: Self-pay | Admitting: Family Medicine

## 2013-07-26 VITALS — BP 130/82 | HR 104 | Temp 98.3°F | Ht 62.5 in | Wt 227.5 lb

## 2013-07-26 DIAGNOSIS — R5383 Other fatigue: Secondary | ICD-10-CM | POA: Insufficient documentation

## 2013-07-26 DIAGNOSIS — E890 Postprocedural hypothyroidism: Secondary | ICD-10-CM

## 2013-07-26 DIAGNOSIS — R5381 Other malaise: Secondary | ICD-10-CM

## 2013-07-26 DIAGNOSIS — R5382 Chronic fatigue, unspecified: Secondary | ICD-10-CM

## 2013-07-26 NOTE — Assessment & Plan Note (Signed)
With recent inc in wt and fatigue  For f/u with endocrinologist and lab next week

## 2013-07-26 NOTE — Patient Instructions (Signed)
I think some of your fatigue and weight gain may be caused or worsened by your new psychiatric medication  Call your psychiatry office to let them know what you are experiencing Keep working on healthy diet and exercise  Hormones and fibromyalgia and stressors may also play a role (also thyroid) Follow up with Dr Dwyane Dee for lab and visit  Please keep me posted

## 2013-07-26 NOTE — Progress Notes (Signed)
Pre visit review using our clinic review tool, if applicable. No additional management support is needed unless otherwise documented below in the visit note. 

## 2013-07-26 NOTE — Assessment & Plan Note (Signed)
Acute on chronic  This seems to coincide with start of new antipsychotic med for bipolar dz (Saphris) Disc pot for wt gain and sedation Other factors such as polypharmacy/obesity/ mood disorder/fibromyalgia  and hypothyroid may also play a role  Disc hormone changes as well  She will call her psychiatrist to discuss (? If candidate to cut dose)

## 2013-07-26 NOTE — Progress Notes (Signed)
Subjective:    Patient ID: Lindsay Fry, female    DOB: 1960/06/14, 53 y.o.   MRN: [id number]  HPI Here not feeling well   A lot of fatigue  ? 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 Fatigue 07/26/2013  . Obesity 05/20/2013  . Bladder pain 02/17/2013  . Subacute confusional state 11/19/2012  . ADD (attention deficit disorder) 11/19/2012  . IFG (impaired fasting glucose) 11/12/2012  . Shortness of breath 09/02/2012  . Chest pain 08/05/2012  . Urinary frequency 08/05/2012  . Low back pain 08/05/2012  . 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 Chronic sinusitis 01/24/2012  . Vertigo, benign positional 01/24/2012  . Other screening mammogram 12/05/2010  . Routine general medical examination at a health care facility 10/14/2010  . FATIGUE 08/09/2009  . POSTMENOPAUSAL STATUS 06/22/2008  . UNSPECIFIED VITAMIN D DEFICIENCY 03/18/2008  . BENIGN NEOPLASM [place] SITE DIGESTIVE SYSTEM 01/09/2007  . HIATAL HERNIA 01/09/2007  . COLONIC POLYPS, ADENOMATOUS, HX OF 01/09/2007  . HYPERCHOLESTEROLEMIA 01/07/2007  . Bipolar disorder, unspecified 01/07/2007  . DEPRESSION 01/07/2007  . HYPERTENSION 01/07/2007  . ALLERGIC RHINITIS 01/07/2007  . ASTHMA 01/07/2007  . GERD 01/07/2007  . COLITIS, ULCERATIVE 01/07/2007  . ACNE ROSACEA 01/07/2007  . MIGRAINES, HX OF 01/07/2007  . FIBROMYALGIA 10/30/2006  .  INSOMNIA 10/30/2006   Past Medical History  Diagnosis Date  . Rosacea   . Family history of malignant neoplasm of gastrointestinal tract   . Allergic rhinitis, cause unspecified   . Anxiety state, unspecified   . Unspecified asthma(493.90)   . Benign neoplasm of other and unspecified site of the digestive system   . Ulcerative colitis, unspecified   . Depression   . Fibromyalgia   . GERD (gastroesophageal reflux disease)   . Hiatal hernia   . Pure hypercholesterolemia   . Unspecified essential hypertension   . Unspecified hypothyroidism   . Insomnia, unspecified   . History of migraines   . Other screening mammogram   . Asymptomatic postmenopausal status (age-related) (natural)   . Unspecified vitamin D deficiency   . Edema   . Dysuria   . History of ovarian cyst   . Cyst     on Achilles tendon  . Tubular adenoma of colon 01/10/11  . Vertigo   . Subacute confusional state 11/19/2012  . ADD (attention deficit disorder) 11/19/2012  . OCD (obsessive compulsive disorder)   . Glaucoma   . History of alcoholism   . Bipolar disorder    Past Surgical History  Procedure Laterality Date  . Wisdom tooth extraction  1980's  . Tonsillectomy  1984  . Cesarean section  1996/1997    placenta previa, gest DM, pre-eclampsia  . Nasal sinus surgery  05/1997  . Precancerous mole removed    . Cholecystectomy  1997    adhesions also  . Meckel  diverticulum excision  1999  . Breast biopsy      on tamoxifen, atypical hyperplasia  . Dexa  04/1999 and 2010    normal  . Colonoscopy  01/2001    Ulcerative colitis  . Esophagogastroduodenoscopy  09/2001    polyp  . Exercise stress test  11/2003    negative  . Nuclear stress test  06/2007    negative  . Colonoscopy  04/2004    UC, polyp  . Colonoscopy  12/08    UC, no polyps  . Sleep study  11/09    no apnea, but did snore (done by [facility])  . Bunionectomy Right   . Femur fracture surgery Left    History  Substance Use Topics  .  Smoking status: Former Smoker -- 0.25 packs/day for 5 years    Types: Cigarettes    Quit date: 01/14/1993  . Smokeless tobacco: Never Used  . Alcohol Use: No     Comment: Recovered ETOH   Family History  Problem Relation Age of Onset  . Coronary artery disease Father   . Colon cancer Father   . Alzheimer's disease Father   . Dementia Father   . Coronary artery disease Mother   . Hypertension Mother   . Osteoporosis Mother   . Colitis Mother   . Crohn's disease Mother   . Breast cancer      great aunts  . Coronary artery disease      Uncle (also AAA)  . Diabetes      remote family history  . Stroke Cousin   . Esophageal cancer Neg Hx   . Rectal cancer Neg Hx   . Stomach cancer Neg Hx    Allergies  Allergen Reactions  . Amphetamines     mania  . Aspirin     REACTION: aggrivates colitis  . Caffeine   . Erythromycin     REACTION: GI upset  . Nsaids     REACTION: aggrivate colitis  . Rosuvastatin     REACTION: increased lfts   Current Outpatient Prescriptions on File Prior to Visit  Medication Sig Dispense Refill  . acetaminophen (TYLENOL ARTHRITIS PAIN) 650 MG CR tablet Take 1,300 mg by mouth 2 (two) times daily as needed.       Marland Kitchen amLODipine (NORVASC) 10 MG tablet TAKE 1 TABLET BY MOUTH EVERY NIGHT AT BEDTIME  90 tablet  3  . aspirin 81 MG tablet Take 81 mg by mouth daily.      Marland Kitchen atorvastatin (LIPITOR) 40 MG tablet TAKE  1/2 TABLET BY MOUTH EVERY DAY  45 tablet  3  . b complex vitamins capsule Take 1 capsule by mouth daily.      Marland Kitchen buPROPion (WELLBUTRIN XL) 150 MG 24 hr tablet Take 300 mg by mouth daily.       . calcium carbonate (TUMS - DOSED IN MG ELEMENTAL CALCIUM) 500 MG chewable tablet Chew 1-2 tablets by mouth as needed for heartburn.      . chlorproMAZINE (THORAZINE) 25 MG tablet Take 25-50 mg by mouth 3 (three) times daily. For migraines if frova doesn't help.       . Cholecalciferol (VITAMIN D3) 2000 UNITS TABS Take 1 tablet by mouth daily.      . Clindamycin  Phosphate foam Apply topically.       . cloNIDine (CATAPRES) 0.1 MG tablet Take 0.05 mg by mouth 2 (two) times daily.      . cyclobenzaprine (FLEXERIL) 10 MG tablet TAKE 2  TABLETS BY MOUTH EVERY NIGHT AT BEDTIME  180 tablet  1  . fluticasone (FLONASE) 50 MCG/ACT nasal spray USE 2 SPRAYS IN EACH NOSTRIL TWICE DAILY  16 g  3  . latanoprost (XALATAN) 0.005 % ophthalmic solution Place 1 drop into both eyes At bedtime.      Marland Kitchen levothyroxine (SYNTHROID) 200 MCG tablet Take 200 mcg by mouth daily.       . Liniments (SALONPAS PAIN RELIEF PATCH EX) Apply topically as needed.      . Multiple Vitamin (MULTIVITAMIN) tablet Take 1 tablet by mouth daily.        . naratriptan (AMERGE) 2.5 MG tablet Take 2.5 mg by mouth 2 (two) times daily as needed for migraine. Take one (1) tablet at onset of headache; if returns or does not resolve, may repeat after 4 hours; do not exceed five (5) mg in 24 hours.      . nitrofurantoin (MACRODANTIN) 100 MG capsule Take 100 mg by mouth as directed. With sexual activity       . pantoprazole (PROTONIX) 40 MG tablet Take 1 tablet (40 mg total) by mouth 2 (two) times daily.  180 tablet  3  . Probiotic Product (PROBIOTIC DAILY PO) Take by mouth.      . Simethicone (GAS-X PO) Take 1 tablet by mouth 4 (four) times daily.      Marland Kitchen spironolactone (ALDACTONE) 25 MG tablet Take 25 mg by mouth 2 (two) times daily.        . traMADol (ULTRAM) 50 MG tablet TAKE 1 TABLET BY MOUTH FOUR TIMES DAILY AS NEEDED  120 tablet  3  . valACYclovir (VALTREX) 500 MG tablet Take 500 mg by mouth 2 (two) times daily.        . vitamin C (ASCORBIC ACID) 500 MG tablet Take 250 mg by mouth daily. Takes 2 a day      . zolpidem (AMBIEN) 10 MG tablet Take 10 mg by mouth at bedtime.       No current facility-administered medications on file prior to visit.     Review of Systems    Review of Systems  Constitutional: Negative for fever, appetite change, pos for fatigue and unexpected wt gain  Eyes: Negative for pain  and visual disturbance.  Respiratory: Negative for cough and shortness of breath.   Cardiovascular: Negative for cp or palpitations    Gastrointestinal: Negative for nausea, abd pain or n/v, pos for IBS Genitourinary: Negative for urgency and frequency.  Skin: Negative for pallor or rash   MSK pos for aches and pains from fibromyalgia  Neurological: Negative for weakness, , numbness and headaches. pos for occ dizziness  Hematological: Negative for adenopathy. Does not bruise/bleed easily.  Psychiatric/Behavioral: Negative for dysphoric mood. The patient is not nervous/anxious.      Objective:   Physical Exam  Constitutional: She appears well-developed and well-nourished. No distress.  obese and well appearing   HENT:  Head: Normocephalic and atraumatic.  Mouth/Throat: Oropharynx is clear and moist.  Eyes: Conjunctivae and EOM are normal. Pupils are equal, round, and reactive to light. No scleral icterus.  Neck: Normal range of motion. Neck supple. No JVD present. Carotid bruit is not present. No thyromegaly present.  Cardiovascular: Normal rate, regular rhythm, normal heart sounds and intact distal pulses.   Pulmonary/Chest: Effort normal and breath sounds normal. No respiratory distress. She has no wheezes. She has no rales.  Abdominal: Soft. Bowel sounds are normal. She exhibits no distension and no mass. There  is no tenderness. There is no rebound and no guarding.  Musculoskeletal: She exhibits no edema and no tenderness.  Lymphadenopathy:    She has no cervical adenopathy.  Neurological: She is alert. She has normal reflexes. No cranial nerve deficit. She exhibits normal muscle tone. Coordination normal.  No tremor  Skin: Skin is warm and dry. No rash noted. No erythema. No pallor.  Psychiatric: Her behavior is normal. Thought content normal. Her mood appears not anxious. Her affect is not blunt and not labile. Her speech is delayed. Her speech is not tangential. Cognition and  memory are normal. She exhibits a depressed mood.  Pleasant and talkative Some psychomotor slowing noted           Assessment & Plan:   Problem List Items Addressed This Visit     Endocrine   Other postablative hypothyroidism     With recent inc in wt and fatigue  For f/u with endocrinologist and lab next week       Other   Fatigue - Primary     Acute on chronic  This seems to coincide with start of new antipsychotic med for bipolar dz (Saphris) Disc pot for wt gain and sedation Other factors such as polypharmacy/obesity/ mood disorder/fibromyalgia  and hypothyroid may also play a role  Disc hormone changes as well  She will call her psychiatrist to discuss (? If candidate to cut dose)

## 2013-07-28 ENCOUNTER — Other Ambulatory Visit: Payer: Self-pay

## 2013-07-29 ENCOUNTER — Other Ambulatory Visit (INDEPENDENT_AMBULATORY_CARE_PROVIDER_SITE_OTHER): Payer: BC Managed Care – PPO

## 2013-07-29 DIAGNOSIS — R7303 Prediabetes: Secondary | ICD-10-CM

## 2013-07-29 DIAGNOSIS — E890 Postprocedural hypothyroidism: Secondary | ICD-10-CM

## 2013-07-29 DIAGNOSIS — R7309 Other abnormal glucose: Secondary | ICD-10-CM

## 2013-07-29 LAB — BASIC METABOLIC PANEL
BUN: 13 mg/dL (ref 6–23)
CO2: 27 mEq/L (ref 19–32)
Calcium: 9.7 mg/dL (ref 8.4–10.5)
Chloride: 101 mEq/L (ref 96–112)
Creatinine, Ser: 0.9 mg/dL (ref 0.4–1.2)
GFR: 72.81 mL/min (ref >60.00)
Glucose, Bld: 138 mg/dL — ABNORMAL HIGH (ref 70–99)
Potassium: 3.9 mEq/L (ref 3.5–5.1)
Sodium: 138 mEq/L (ref 135–145)

## 2013-07-29 LAB — T4, FREE: Free T4: 1.02 ng/dL (ref 0.60–1.60)

## 2013-07-29 LAB — TSH: TSH: 1.47 u[IU]/mL (ref 0.35–4.50)

## 2013-07-29 LAB — HEMOGLOBIN A1C: Hgb A1c MFr Bld: 6.2 % (ref 4.6–6.5)

## 2013-08-02 ENCOUNTER — Encounter: Payer: Self-pay | Admitting: Endocrinology

## 2013-08-02 ENCOUNTER — Ambulatory Visit (INDEPENDENT_AMBULATORY_CARE_PROVIDER_SITE_OTHER): Payer: BC Managed Care – PPO | Admitting: Endocrinology

## 2013-08-02 VITALS — BP 124/76 | HR 100 | Temp 98.0°F | Resp 16 | Ht 66.0 in | Wt 229.8 lb

## 2013-08-02 DIAGNOSIS — R7301 Impaired fasting glucose: Secondary | ICD-10-CM

## 2013-08-02 DIAGNOSIS — E890 Postprocedural hypothyroidism: Secondary | ICD-10-CM

## 2013-08-02 NOTE — Progress Notes (Signed)
Patient ID: Lindsay Fry, female   DOB: 12-20-1960, 53 y.o.   MRN: [id number]   Reason for Appointment:  Hypothyroidism, followup visit  History of Present Illness:  The hypothyroidism was first diagnosed in 1992 after her treatment for Graves' disease with [id number] She has been on relatively large doses of thyroxine supplements She was on 224 mcg since 11/13 and her TSH was normal in 3/14 Subjectively difficult to assess her thyroid since she tends to have fatigue chronically.  In 2014 her dose was reduced to 200 mcg daily and her dose has been fluctuating since then In 3/15 because of low normal TSH was told to take 6-1/2 tablets a week However followup in 4/15 showed a TSH of 8.1 despite reportedly good compliance with her medication She is now back on 200 mcg, 7 tablets a week She is still on the brand name Synthroid  Her physical symptoms are variable and more related to her bipolar illness. No unusual fatigue Is having difficulty losing weight and has gained weight despite going on weight watchers and exercising recently She has her usual hot flashes          She is taking the tablet in the morning daily before breakfast.   Lab Results  Component Value Date   FREET4 1.02 07/29/2013   FREET4 1.01 04/05/2013   FREET4 1.47 12/24/2012   TSH 1.47 07/29/2013   TSH 8.09* 05/12/2013   TSH 0.35 04/05/2013    PREDIABETES: She has had impaired fasting glucose and her last fasting glucose was 105, A1c was 6 previously She is usually able to maintain the level of control with usually a good diet Walking 30 min; 2-3 days a week, just starting to do this Although she is gaining weight her A1c and glucose appeared to be stable, recent glucose was nonfasting  Wt Readings from Last 3 Encounters:  08/02/13 229 lb 12.8 oz (104.237 kg)  07/26/13 227 lb 8 oz (103.193 kg)  06/04/13 217 lb 9.6 oz (98.703 kg)   LABS:  Appointment on 07/29/2013  Component Date Value Ref Range Status  .  Hemoglobin A1C 07/29/2013 6.2  4.6 - 6.5 % Final   Glycemic Control Guidelines for People with Diabetes:Non Diabetic:  <6%Goal of Therapy: <7%Additional Action Suggested:  >8%   . Sodium 07/29/2013 138  135 - 145 mEq/L Final  . Potassium 07/29/2013 3.9  3.5 - 5.1 mEq/L Final  . Chloride 07/29/2013 101  96 - 112 mEq/L Final  . CO2 07/29/2013 27  19 - 32 mEq/L Final  . Glucose, Bld 07/29/2013 138* 70 - 99 mg/dL Final  . BUN 07/29/2013 13  6 - 23 mg/dL Final  . Creatinine, Ser 07/29/2013 0.9  0.4 - 1.2 mg/dL Final  . Calcium 07/29/2013 9.7  8.4 - 10.5 mg/dL Final  . GFR 07/29/2013 72.81  >60.00 mL/min Final  . TSH 07/29/2013 1.47  0.35 - 4.50 uIU/mL Final  . Free T4 07/29/2013 1.02  0.60 - 1.60 ng/dL Final      Medication List       This list is accurate as of: 08/02/13  4:27 PM.  Always use your most recent med list.               amLODipine 10 MG tablet  Commonly known as:  NORVASC  TAKE 1 TABLET BY MOUTH EVERY NIGHT AT BEDTIME     aspirin 81 MG tablet  Take 81 mg by mouth daily.     atorvastatin 40 MG  tablet  Commonly known as:  LIPITOR  TAKE  1/2 TABLET BY MOUTH EVERY DAY     b complex vitamins capsule  Take 1 capsule by mouth daily.     buPROPion 150 MG 24 hr tablet  Commonly known as:  WELLBUTRIN XL  Take 300 mg by mouth daily.     calcium carbonate 500 MG chewable tablet  Commonly known as:  TUMS - dosed in mg elemental calcium  Chew 1-2 tablets by mouth as needed for heartburn.     chlorproMAZINE 25 MG tablet  Commonly known as:  THORAZINE  Take 25-50 mg by mouth 3 (three) times daily. For migraines if frova doesn't help.     Clindamycin Phosphate foam  Apply topically.     cloNIDine 0.1 MG tablet  Commonly known as:  CATAPRES  Take 0.05 mg by mouth 2 (two) times daily.     cyclobenzaprine 10 MG tablet  Commonly known as:  FLEXERIL  TAKE 2 TABLETS BY MOUTH EVERY NIGHT AT BEDTIME     diphenhydrAMINE 25 MG tablet  Commonly known as:  BENADRYL  Take 25  mg by mouth every 6 (six) hours as needed. Takes 2 tablets at bedtime     fluticasone 50 MCG/ACT nasal spray  Commonly known as:  FLONASE  USE 2 SPRAYS IN EACH NOSTRIL TWICE DAILY     GAS-X PO  Take 1 tablet by mouth 4 (four) times daily.     lamoTRIgine 200 MG tablet  Commonly known as:  LAMICTAL  Take 400 mg by mouth daily.     latanoprost 0.005 % ophthalmic solution  Commonly known as:  XALATAN  Place 1 drop into both eyes At bedtime.     multivitamin tablet  Take 1 tablet by mouth daily.     naratriptan 2.5 MG tablet  Commonly known as:  AMERGE  Take 2.5 mg by mouth 2 (two) times daily as needed for migraine. Take one (1) tablet at onset of headache; if returns or does not resolve, may repeat after 4 hours; do not exceed five (5) mg in 24 hours.     nitrofurantoin 100 MG capsule  Commonly known as:  MACRODANTIN  Take 100 mg by mouth as directed. With sexual activity     pantoprazole 40 MG tablet  Commonly known as:  PROTONIX  Take 1 tablet (40 mg total) by mouth 2 (two) times daily.     PROBIOTIC DAILY PO  Take by mouth.     SALONPAS PAIN RELIEF PATCH EX  Apply topically as needed.     SAPHRIS 10 MG Subl  Generic drug:  Asenapine Maleate  Place 20 mg under the tongue at bedtime.     spironolactone 25 MG tablet  Commonly known as:  ALDACTONE  Take 25 mg by mouth 2 (two) times daily.     SYNTHROID 200 MCG tablet  Generic drug:  levothyroxine  Take 200 mcg by mouth daily.     traMADol 50 MG tablet  Commonly known as:  ULTRAM  TAKE 1 TABLET BY MOUTH FOUR TIMES DAILY AS NEEDED     TYLENOL ARTHRITIS PAIN 650 MG CR tablet  Generic drug:  acetaminophen  Take 1,300 mg by mouth 2 (two) times daily as needed.     valACYclovir 500 MG tablet  Commonly known as:  VALTREX  Take 500 mg by mouth 2 (two) times daily.     vitamin C 500 MG tablet  Commonly known as:  ASCORBIC ACID  Take  250 mg by mouth daily. Takes 2 a day     Vitamin D3 2000 UNITS Tabs  Take 1  tablet by mouth daily.     zolpidem 10 MG tablet  Commonly known as:  AMBIEN  Take 10 mg by mouth at bedtime.        Allergies:  Allergies  Allergen Reactions  . Amphetamines     mania  . Aspirin     REACTION: aggrivates colitis  . Caffeine   . Erythromycin     REACTION: GI upset  . Nsaids     REACTION: aggrivate colitis  . Rosuvastatin     REACTION: increased lfts    Past Medical History  Diagnosis Date  . Rosacea   . Family history of malignant neoplasm of gastrointestinal tract   . Allergic rhinitis, cause unspecified   . Anxiety state, unspecified   . Unspecified asthma(493.90)   . Benign neoplasm of other and unspecified site of the digestive system   . Ulcerative colitis, unspecified   . Depression   . Fibromyalgia   . GERD (gastroesophageal reflux disease)   . Hiatal hernia   . Pure hypercholesterolemia   . Unspecified essential hypertension   . Unspecified hypothyroidism   . Insomnia, unspecified   . History of migraines   . Other screening mammogram   . Asymptomatic postmenopausal status (age-related) (natural)   . Unspecified vitamin D deficiency   . Edema   . Dysuria   . History of ovarian cyst   . Cyst     on Achilles tendon  . Tubular adenoma of colon 01/10/11  . Vertigo   . Subacute confusional state 11/19/2012  . ADD (attention deficit disorder) 11/19/2012  . OCD (obsessive compulsive disorder)   . Glaucoma   . History of alcoholism   . Bipolar disorder     Past Surgical History  Procedure Laterality Date  . Wisdom tooth extraction  1980's  . Tonsillectomy  1984  . Cesarean section  1996/1997    placenta previa, gest DM, pre-eclampsia  . Nasal sinus surgery  05/1997  . Precancerous mole removed    . Cholecystectomy  1997    adhesions also  . Meckel diverticulum excision  1999  . Breast biopsy      on tamoxifen, atypical hyperplasia  . Dexa  04/1999 and 2010    normal  . Colonoscopy  01/2001    Ulcerative colitis  .  Esophagogastroduodenoscopy  09/2001    polyp  . Exercise stress test  11/2003    negative  . Nuclear stress test  06/2007    negative  . Colonoscopy  04/2004    UC, polyp  . Colonoscopy  12/08    UC, no polyps  . Sleep study  11/09    no apnea, but did snore (done by [facility])  . Bunionectomy Right   . Femur fracture surgery Left     Family History  Problem Relation Age of Onset  . Coronary artery disease Father   . Colon cancer Father   . Alzheimer's disease Father   . Dementia Father   . Coronary artery disease Mother   . Hypertension Mother   . Osteoporosis Mother   . Colitis Mother   . Crohn's disease Mother   . Breast cancer      great aunts  . Coronary artery disease      Uncle (also AAA)  . Diabetes      remote family history  . Stroke Cousin   .  Esophageal cancer Neg Hx   . Rectal cancer Neg Hx   . Stomach cancer Neg Hx     Social History:  reports that she quit smoking about 20 years ago. Her smoking use included Cigarettes. She has a 1.25 pack-year smoking history. She has never used smokeless tobacco. She reports that she does not drink alcohol or use illicit drugs.  REVIEW Of SYSTEMS:  She has had difficulty losing weight  Wt Readings from Last 3 Encounters:  08/02/13 229 lb 12.8 oz (104.237 kg)  07/26/13 227 lb 8 oz (103.193 kg)  06/04/13 217 lb 9.6 oz (98.703 kg)   She is still not completely able to control her bipolar disorder and still needs further adjustment in medication   Examination:   BP 124/76  Pulse 100  Temp(Src) 98 F (36.7 C)  Resp 16  Ht 5\' 6"  (1.676 m)  Wt 229 lb 12.8 oz (104.237 kg)  BMI 37.11 kg/m2  SpO2 94%   GENERAL APPEARANCE:  she looks well, not unusually anxious  No puffiness of face, hands or feet     Assessments   Hypothyroidism, post ablative:  Currently  her TSH is back to normal, again her symptoms are difficult to gauge because of her bipolar illness and effects of medications on her weight She will  continue the same dose of 200 mcg daily and followup in 6 months  Impaired fasting glucose: Her A1c is still in the upper normal range She will try to get a fasting glucose done on the next visit Meanwhile will continue to work with Weight Watchers and her exercise program that she is starting  [facility] 08/02/2013, 4:27 PM

## 2013-08-07 ENCOUNTER — Other Ambulatory Visit: Payer: Self-pay | Admitting: Endocrinology

## 2013-09-09 ENCOUNTER — Other Ambulatory Visit: Payer: Self-pay | Admitting: Family Medicine

## 2013-09-09 NOTE — Telephone Encounter (Addendum)
Pt left v/m requesting status of flonase to [facility] in [place]. Left v/m notifying pt refill done.

## 2013-09-15 ENCOUNTER — Other Ambulatory Visit: Payer: Self-pay | Admitting: Family Medicine

## 2013-09-16 NOTE — Telephone Encounter (Signed)
Please refill for 6 mo 

## 2013-09-16 NOTE — Telephone Encounter (Signed)
done

## 2013-09-16 NOTE — Telephone Encounter (Signed)
Electronic refill request, please advise  

## 2013-10-08 ENCOUNTER — Other Ambulatory Visit: Payer: BC Managed Care – PPO

## 2013-10-11 ENCOUNTER — Ambulatory Visit: Payer: BC Managed Care – PPO | Admitting: Endocrinology

## 2013-10-13 ENCOUNTER — Other Ambulatory Visit: Payer: Self-pay | Admitting: *Deleted

## 2013-10-13 MED ORDER — TRAMADOL HCL 50 MG PO TABS: ORAL_TABLET | ORAL | Status: DC

## 2013-10-13 NOTE — Telephone Encounter (Signed)
Last office visit 07/26/2013.  Last refilled 06/08/2013 for #120 with 3 refills.  Ok to refill?

## 2013-10-13 NOTE — Telephone Encounter (Signed)
Called to [facility] in [place].

## 2013-10-13 NOTE — Telephone Encounter (Signed)
Px written for call in   

## 2013-10-19 ENCOUNTER — Ambulatory Visit (INDEPENDENT_AMBULATORY_CARE_PROVIDER_SITE_OTHER): Payer: BC Managed Care – PPO

## 2013-10-19 DIAGNOSIS — Z23 Encounter for immunization: Secondary | ICD-10-CM

## 2013-12-23 ENCOUNTER — Ambulatory Visit: Payer: BC Managed Care – PPO | Admitting: Internal Medicine

## 2014-01-05 ENCOUNTER — Other Ambulatory Visit: Payer: Self-pay | Admitting: Family Medicine

## 2014-01-06 NOTE — Telephone Encounter (Signed)
Electronic refill request, no recent/future appt., please advise  

## 2014-01-06 NOTE — Telephone Encounter (Signed)
done

## 2014-01-06 NOTE — Telephone Encounter (Signed)
Please refill for a year  

## 2014-01-12 NOTE — Telephone Encounter (Signed)
error 

## 2014-02-02 ENCOUNTER — Ambulatory Visit: Payer: BC Managed Care – PPO | Admitting: Endocrinology

## 2014-02-17 ENCOUNTER — Ambulatory Visit (INDEPENDENT_AMBULATORY_CARE_PROVIDER_SITE_OTHER): Payer: BLUE CROSS/BLUE SHIELD | Admitting: Endocrinology

## 2014-02-17 ENCOUNTER — Encounter: Payer: Self-pay | Admitting: Endocrinology

## 2014-02-17 VITALS — BP 153/105 | HR 105 | Temp 97.9°F | Resp 16 | Ht 66.0 in | Wt 238.4 lb

## 2014-02-17 DIAGNOSIS — R7301 Impaired fasting glucose: Secondary | ICD-10-CM

## 2014-02-17 DIAGNOSIS — E890 Postprocedural hypothyroidism: Secondary | ICD-10-CM

## 2014-02-17 NOTE — Progress Notes (Signed)
Patient ID: Lindsay Fry, female   DOB: 1960-11-11, 54 y.o.   MRN: [id number]   Reason for Appointment:  Hypothyroidism, followup visit  History of Present Illness:  The hypothyroidism was first diagnosed in 1992 after her treatment for Graves' disease with I-131 She has been on relatively large doses of thyroxine supplements She was on 224 mcg since 11/13 and her TSH was normal in 3/14 Subjectively difficult to assess her thyroid since she tends to have fatigue chronically.  In 2014 her dose was reduced to 200 mcg daily and her dose has been fluctuating since then She is now on 200 mcg, 7 tablets a week since 04/2013  She is still on the brand name Synthroid  Her physical symptoms are variable and more related to other medical problems including sleep disturbances and bipolar illness.  She is feeling more tired recently but has had other issues also  Is having difficulty losing weight and has gained weight  She has her usual hot flashes          She is taking the tablet in the morning daily before breakfast.   Lab Results  Component Value Date   FREET4 1.02 07/29/2013   FREET4 1.01 04/05/2013   FREET4 1.47 12/24/2012   TSH 1.47 07/29/2013   TSH 8.09* 05/12/2013   TSH 0.35 04/05/2013    PREDIABETES:  She has had impaired fasting glucose and her last fasting glucose was 105, A1c was 6 previously She is usually able to maintain the stable glucose levels  with usually a good diet Recently has gone back to weight watchers but has not been able to exercise, previously was walking  Labs pending from today    Wt Readings from Last 3 Encounters:  02/17/14 238 lb 6.4 oz (108.138 kg)  08/02/13 229 lb 12.8 oz (104.237 kg)  07/26/13 227 lb 8 oz (103.193 kg)   LABS:  No visits with results within 1 Week(s) from this visit. Latest known visit with results is:  Appointment on 07/29/2013  Component Date Value Ref Range Status  . Hgb A1c MFr Bld 07/29/2013 6.2  4.6 - 6.5 % Final    Glycemic Control Guidelines for People with Diabetes:Non Diabetic:  <6%Goal of Therapy: <7%Additional Action Suggested:  >8%   . Sodium 07/29/2013 138  135 - 145 mEq/L Final  . Potassium 07/29/2013 3.9  3.5 - 5.1 mEq/L Final  . Chloride 07/29/2013 101  96 - 112 mEq/L Final  . CO2 07/29/2013 27  19 - 32 mEq/L Final  . Glucose, Bld 07/29/2013 138* 70 - 99 mg/dL Final  . BUN 07/29/2013 13  6 - 23 mg/dL Final  . Creatinine, Ser 07/29/2013 0.9  0.4 - 1.2 mg/dL Final  . Calcium 07/29/2013 9.7  8.4 - 10.5 mg/dL Final  . GFR 07/29/2013 72.81  >60.00 mL/min Final  . TSH 07/29/2013 1.47  0.35 - 4.50 uIU/mL Final  . Free T4 07/29/2013 1.02  0.60 - 1.60 ng/dL Final      Medication List       This list is accurate as of: 02/17/14  4:04 PM.  Always use your most recent med list.               amLODipine 10 MG tablet  Commonly known as:  NORVASC  TAKE 1 TABLET BY MOUTH EVERY NIGHT AT BEDTIME     aspirin 81 MG tablet  Take 81 mg by mouth daily.     atorvastatin 40 MG tablet  Commonly  known as:  LIPITOR  TAKE  1/2 TABLET BY MOUTH EVERY DAY     b complex vitamins capsule  Take 1 capsule by mouth daily.     buPROPion 150 MG 24 hr tablet  Commonly known as:  WELLBUTRIN XL  Take 300 mg by mouth daily.     calcium carbonate 500 MG chewable tablet  Commonly known as:  TUMS - dosed in mg elemental calcium  Chew 1-2 tablets by mouth as needed for heartburn.     chlorproMAZINE 25 MG tablet  Commonly known as:  THORAZINE  Take 25-50 mg by mouth 3 (three) times daily. For migraines if frova doesn't help.     Clindamycin Phosphate foam  Apply topically.     cloNIDine 0.1 MG tablet  Commonly known as:  CATAPRES  Take 0.05 mg by mouth 2 (two) times daily.     cyclobenzaprine 10 MG tablet  Commonly known as:  FLEXERIL  TAKE 2 TABLETS BY MOUTH EVERY NIGHT AT BEDTIME     diphenhydrAMINE 25 MG tablet  Commonly known as:  BENADRYL  Take 25 mg by mouth every 6 (six) hours as needed. Takes  2 tablets at bedtime     fluticasone 50 MCG/ACT nasal spray  Commonly known as:  FLONASE  USE 2 SPRAYS IN EACH NOSTRIL TWICE DAILY.     GAS-X PO  Take 1 tablet by mouth 4 (four) times daily.     lamoTRIgine 200 MG tablet  Commonly known as:  LAMICTAL  Take 400 mg by mouth daily.     latanoprost 0.005 % ophthalmic solution  Commonly known as:  XALATAN  Place 1 drop into both eyes At bedtime.     multivitamin tablet  Take 1 tablet by mouth daily.     naratriptan 2.5 MG tablet  Commonly known as:  AMERGE  Take 2.5 mg by mouth 2 (two) times daily as needed for migraine. Take one (1) tablet at onset of headache; if returns or does not resolve, may repeat after 4 hours; do not exceed five (5) mg in 24 hours.     nitrofurantoin 100 MG capsule  Commonly known as:  MACRODANTIN  Take 100 mg by mouth as directed. With sexual activity     pantoprazole 40 MG tablet  Commonly known as:  PROTONIX  Take 1 tablet (40 mg total) by mouth 2 (two) times daily.     PROBIOTIC DAILY PO  Take by mouth.     SALONPAS PAIN RELIEF PATCH EX  Apply topically as needed.     SAPHRIS 10 MG Subl  Generic drug:  Asenapine Maleate  Place 20 mg under the tongue at bedtime.     spironolactone 25 MG tablet  Commonly known as:  ALDACTONE  Take 25 mg by mouth 2 (two) times daily.     SYNTHROID 200 MCG tablet  Generic drug:  levothyroxine  TAKE 1 TABLET BY MOUTH EVERY DAY BEFORE BREAKFAST     traMADol 50 MG tablet  Commonly known as:  ULTRAM  TAKE 1 TABLET BY MOUTH FOUR TIMES DAILY AS NEEDED     TYLENOL ARTHRITIS PAIN 650 MG CR tablet  Generic drug:  acetaminophen  Take 1,300 mg by mouth 2 (two) times daily as needed.     valACYclovir 500 MG tablet  Commonly known as:  VALTREX  Take 500 mg by mouth 2 (two) times daily.     vitamin C 500 MG tablet  Commonly known as:  ASCORBIC ACID  Take  250 mg by mouth daily. Takes 2 a day     Vitamin D3 2000 UNITS Tabs  Take 1 tablet by mouth daily.      VYVANSE 50 MG capsule  Generic drug:  lisdexamfetamine     zolpidem 10 MG tablet  Commonly known as:  AMBIEN  Take 10 mg by mouth at bedtime.        Allergies:  Allergies  Allergen Reactions  . Amphetamines     mania  . Aspirin     REACTION: aggrivates colitis  . Caffeine   . Erythromycin     REACTION: GI upset  . Nsaids     REACTION: aggrivate colitis  . Rosuvastatin     REACTION: increased lfts    Past Medical History  Diagnosis Date  . Rosacea   . Family history of malignant neoplasm of gastrointestinal tract   . Allergic rhinitis, cause unspecified   . Anxiety state, unspecified   . Unspecified asthma(493.90)   . Benign neoplasm of other and unspecified site of the digestive system   . Ulcerative colitis, unspecified   . Depression   . Fibromyalgia   . GERD (gastroesophageal reflux disease)   . Hiatal hernia   . Pure hypercholesterolemia   . Unspecified essential hypertension   . Unspecified hypothyroidism   . Insomnia, unspecified   . History of migraines   . Other screening mammogram   . Asymptomatic postmenopausal status (age-related) (natural)   . Unspecified vitamin D deficiency   . Edema   . Dysuria   . History of ovarian cyst   . Cyst     on Achilles tendon  . Tubular adenoma of colon 01/10/11  . Vertigo   . Subacute confusional state 11/19/2012  . ADD (attention deficit disorder) 11/19/2012  . OCD (obsessive compulsive disorder)   . Glaucoma   . History of alcoholism   . Bipolar disorder     Past Surgical History  Procedure Laterality Date  . Wisdom tooth extraction  1980's  . Tonsillectomy  1984  . Cesarean section  1996/1997    placenta previa, gest DM, pre-eclampsia  . Nasal sinus surgery  05/1997  . Precancerous mole removed    . Cholecystectomy  1997    adhesions also  . Meckel diverticulum excision  1999  . Breast biopsy      on tamoxifen, atypical hyperplasia  . Dexa  04/1999 and 2010    normal  . Colonoscopy  01/2001     Ulcerative colitis  . Esophagogastroduodenoscopy  09/2001    polyp  . Exercise stress test  11/2003    negative  . Nuclear stress test  06/2007    negative  . Colonoscopy  04/2004    UC, polyp  . Colonoscopy  12/08    UC, no polyps  . Sleep study  11/09    no apnea, but did snore (done by [facility])  . Bunionectomy Right   . Femur fracture surgery Left     Family History  Problem Relation Age of Onset  . Coronary artery disease Father   . Colon cancer Father   . Alzheimer's disease Father   . Dementia Father   . Coronary artery disease Mother   . Hypertension Mother   . Osteoporosis Mother   . Colitis Mother   . Crohn's disease Mother   . Breast cancer      great aunts  . Coronary artery disease      Uncle (also AAA)  .  Diabetes      remote family history  . Stroke Cousin   . Esophageal cancer Neg Hx   . Rectal cancer Neg Hx   . Stomach cancer Neg Hx     Social History:  reports that she quit smoking about 21 years ago. Her smoking use included Cigarettes. She has a 1.25 pack-year smoking history. She has never used smokeless tobacco. She reports that she does not drink alcohol or use illicit drugs.  REVIEW Of SYSTEMS:  She has had difficulty losing weight, no exercise now  Wt Readings from Last 3 Encounters:  02/17/14 238 lb 6.4 oz (108.138 kg)  08/02/13 229 lb 12.8 oz (104.237 kg)  07/26/13 227 lb 8 oz (103.193 kg)   She is still not completely able to control her bipolar disorder and still needs further adjustment in medication   Examination:   BP 153/105 mmHg  Pulse 105  Temp(Src) 97.9 F (36.6 C)  Resp 16  Ht 5\' 6"  (1.676 m)  Wt 238 lb 6.4 oz (108.138 kg)  BMI 38.50 kg/m2  SpO2 95%  She looks well, mildly anxious/depressed   No puffiness of face, hands or feet  Thyroid not palpable Biceps reflexes appear normal    Assessments   Hypothyroidism, post ablative:  She has had long-standing hypothyroidism and has had somewhat variable doses in the  past Currently she is complaining about fatigue but has other medical issues that can cause her symptoms She has also gained weight  For now will check her thyroid again today and decide on further management She can have her lab drawn with her PCP prior to her next visit  Impaired fasting glucose: Her A1c will need to be checked and random glucose also Will try to get fasting glucose on the next visit  Meanwhile will continue to work with Weight Watchers and her exercise program that she is starting  [facility] 02/17/2014, 4:04 PM   Addendum: Labs as follows  TSH higher and she can add another 25 g to current regimen A1c is 6.7. She will have a glucose tolerance test done and will review results when available

## 2014-02-18 ENCOUNTER — Other Ambulatory Visit: Payer: Self-pay | Admitting: *Deleted

## 2014-02-18 LAB — TSH: TSH: 7.64 u[IU]/mL — ABNORMAL HIGH (ref 0.35–4.50)

## 2014-02-18 LAB — T4, FREE: Free T4: 0.99 ng/dL (ref 0.60–1.60)

## 2014-02-18 LAB — GLUCOSE, RANDOM: Glucose, Bld: 98 mg/dL (ref 70–99)

## 2014-02-18 LAB — HEMOGLOBIN A1C: Hgb A1c MFr Bld: 6.7 % — ABNORMAL HIGH (ref 4.6–6.5)

## 2014-02-18 MED ORDER — TRAMADOL HCL 50 MG PO TABS: ORAL_TABLET | ORAL | Status: DC

## 2014-02-18 NOTE — Telephone Encounter (Signed)
Px written for call in   

## 2014-02-18 NOTE — Telephone Encounter (Signed)
Px called in to [facility].

## 2014-02-18 NOTE — Progress Notes (Signed)
Quick Note:   TSH higher and she can add another 25 g to the 200 g dose of Synthroid A1c is 6.7, in diabetic range. She will have a glucose tolerance test done: Please see if this can be arranged at the [facility] office, order has been entered; will review results when available Would like to see her back in 2 months instead of 6 and she can still have labs done at [facility] for TSH  ______

## 2014-02-18 NOTE — Telephone Encounter (Signed)
Request for refill. Last prescribed on 10/13/13. Last seen 07/26/13. No future appt.

## 2014-02-21 ENCOUNTER — Other Ambulatory Visit: Payer: Self-pay | Admitting: *Deleted

## 2014-02-21 ENCOUNTER — Other Ambulatory Visit: Payer: Self-pay | Admitting: Endocrinology

## 2014-02-21 ENCOUNTER — Telehealth: Payer: Self-pay | Admitting: Endocrinology

## 2014-02-21 MED ORDER — LEVOTHYROXINE SODIUM 25 MCG PO TABS: ORAL_TABLET | ORAL | Status: DC

## 2014-02-21 NOTE — Telephone Encounter (Signed)
Patient called and would like her lab results  ° ° °Please advise  ° ° °Thank you  °

## 2014-02-22 NOTE — Telephone Encounter (Signed)
Patient would like for you to call her, she has questions about her synthroid medication.

## 2014-02-23 ENCOUNTER — Other Ambulatory Visit: Payer: BLUE CROSS/BLUE SHIELD

## 2014-02-23 DIAGNOSIS — R7301 Impaired fasting glucose: Secondary | ICD-10-CM

## 2014-02-23 LAB — GLUCOSE TOLERANCE, 2 HOURS
Glucose, 1 Hour GTT: 300 mg/dL
Glucose, 2 hour: 265 mg/dL
Glucose, Fasting: 164 mg/dL — ABNORMAL HIGH (ref 70–99)

## 2014-02-24 NOTE — Progress Notes (Signed)
Quick Note:  Please let patient know that the lab shows frank diabetes with all abnormal blood sugars and needs to start metformin ER Start taking Metformin 500 mg, 1 tablet with your main meal for 5 days. Occasionally this may initially cause loose stools or nausea. If tolerating well after 5 days add a second Metformin tablet (500 mg) at the same time. Continue adding another tablet after 5 days days if no persistent nausea or diarrhea until reaching the maximum tolerated dose or the full dose of 3 tablets. Needs to follow-up in 6 weeks, same day lab, also recommend consultation with dietitian if she agrees ______

## 2014-02-28 ENCOUNTER — Telehealth: Payer: Self-pay | Admitting: Endocrinology

## 2014-02-28 ENCOUNTER — Other Ambulatory Visit: Payer: Self-pay | Admitting: *Deleted

## 2014-02-28 MED ORDER — METFORMIN HCL 500 MG PO TABS: ORAL_TABLET | ORAL | Status: DC

## 2014-02-28 NOTE — Telephone Encounter (Signed)
Called pt and advised her per Dr Ronnie Derby result note. Pt understood. Pt scheduled follow up appt.

## 2014-02-28 NOTE — Telephone Encounter (Signed)
Called pt and advised her that she can pick up the metformin today. Pt understood.

## 2014-02-28 NOTE — Telephone Encounter (Signed)
Patient would lke to know if she can pick up the prescription to [facility], please advise.

## 2014-02-28 NOTE — Telephone Encounter (Signed)
Patient would like for a nurse to please call her regarding her GTT   Thank you

## 2014-03-01 ENCOUNTER — Ambulatory Visit (INDEPENDENT_AMBULATORY_CARE_PROVIDER_SITE_OTHER): Payer: BLUE CROSS/BLUE SHIELD | Admitting: Internal Medicine

## 2014-03-01 ENCOUNTER — Encounter: Payer: Self-pay | Admitting: Internal Medicine

## 2014-03-01 ENCOUNTER — Telehealth: Payer: Self-pay | Admitting: Endocrinology

## 2014-03-01 VITALS — BP 140/100 | HR 104 | Ht 65.75 in | Wt 239.0 lb

## 2014-03-01 DIAGNOSIS — K51919 Ulcerative colitis, unspecified with unspecified complications: Secondary | ICD-10-CM

## 2014-03-01 MED ORDER — METRONIDAZOLE 250 MG PO TABS: 250.0000 mg | ORAL_TABLET | Freq: Three times a day (TID) | ORAL | Status: DC

## 2014-03-01 MED ORDER — LINACLOTIDE 145 MCG PO CAPS: 145.0000 ug | ORAL_CAPSULE | Freq: Every day | ORAL | Status: DC

## 2014-03-01 MED ORDER — MOVIPREP 100 G PO SOLR: 1.0000 | Freq: Once | ORAL | Status: DC

## 2014-03-01 NOTE — Progress Notes (Signed)
Lindsay Fry 02/11/1960 [id number]  Note: This dictation was prepared with Dragon digital system. Any transcriptional errors that result from this procedure are unintentional.   History of Present Illness: This is a 54 year old white female with the ulcerative pancolitis since 1988. Positive family history of colon cancer in her father and inflammatory bowel disease in her mother who just passed away last month. At 91. Her last colonoscopy in December 2012 showed tubular adenoma with low-grade dysplasia and quiescent chronic  colitis. Prior colonoscopy in 2008 also showed mild dysplasia of the colon. She is here today because of change in bowel habits. She has chronic constipation which she attributes to multiple psychotropic medications for bipolar disorder and ADHD. She has noticed change in the smell of the stool. She has been craving creamy foods and carbohydrates and as a consequence has gained large amount of weight. ( 9 lbs since 02/2012) She is now a diabetic.( Dr Dwyane Dee). She was started on metformin 500 mg which she will gradually increased to 1500 mg daily. She has a history of gastroesophageal reflux. Last upper endoscopy September 2003 showed small hiatal hernia and bile reflux. She had a prior cholecystectomy in 1997. Her symptoms are controlled with Protonix 40 mg twice a day.    Past Medical History  Diagnosis Date  . Rosacea   . Family history of malignant neoplasm of gastrointestinal tract   . Allergic rhinitis, cause unspecified   . Anxiety state, unspecified   . Unspecified asthma(493.90)   . Benign neoplasm of other and unspecified site of the digestive system   . Ulcerative colitis, unspecified   . Depression   . Fibromyalgia   . GERD (gastroesophageal reflux disease)   . Hiatal hernia   . Pure hypercholesterolemia   . Unspecified essential hypertension   . Unspecified hypothyroidism   . Insomnia, unspecified   . History of migraines   . Other screening mammogram    . Asymptomatic postmenopausal status (age-related) (natural)   . Unspecified vitamin D deficiency   . Edema   . Dysuria   . History of ovarian cyst   . Cyst     on Achilles tendon  . Tubular adenoma of colon 01/10/11  . Vertigo   . Subacute confusional state 11/19/2012  . ADD (attention deficit disorder) 11/19/2012  . OCD (obsessive compulsive disorder)   . Glaucoma   . History of alcoholism   . Bipolar disorder   . Diabetes mellitus     frank    Past Surgical History  Procedure Laterality Date  . Wisdom tooth extraction  1980's  . Tonsillectomy  1984  . Cesarean section  1996/1997    placenta previa, gest DM, pre-eclampsia  . Nasal sinus surgery  05/1997  . Precancerous mole removed    . Cholecystectomy  1997    adhesions also  . Meckel diverticulum excision  1999  . Breast biopsy      on tamoxifen, atypical hyperplasia  . Dexa  04/1999 and 2010    normal  . Colonoscopy  01/2001    Ulcerative colitis  . Esophagogastroduodenoscopy  09/2001    polyp  . Exercise stress test  11/2003    negative  . Nuclear stress test  06/2007    negative  . Colonoscopy  04/2004    UC, polyp  . Colonoscopy  12/08    UC, no polyps  . Sleep study  11/09    no apnea, but did snore (done by [facility])  . Bunionectomy Right   .  Femur fracture surgery Left     Allergies  Allergen Reactions  . Aspirin     REACTION: aggrivates colitis  . Caffeine   . Crestor [Rosuvastatin]     REACTION: increased lfts  . Erythromycin     REACTION: GI upset  . Nsaids     REACTION: aggrivate colitis    Family history and social history have been reviewed.  Review of Systems: Change in bowel habits. Weight gain. Increased reflux. Chronic constipation  The remainder of the 10 point ROS is negative except as outlined in the H&P  Physical Exam: General Appearance Well developed, in no distress, massively obese Eyes  Non icteric  HEENT  Non traumatic, normocephalic  Mouth No lesion, tongue  papillated, no cheilosis Neck Supple without adenopathy, thyroid not enlarged, no carotid bruits, no JVD Lungs Clear to auscultation bilaterally COR Normal S1, normal S2, regular rhythm, no murmur, quiet precordium Abdomen large protuberant but soft with minimal minimal tenderness in left lower quadrant. No fluid wave. Liver edge at costal margin Rectal normal rectal sphincter tone. Soft Hemoccult negative stool Extremities  No pedal edema Skin No lesions Neurological Alert and oriented x 3 Psychological Normal mood and affect  Assessment and Plan:   54 year old white female with change in bowel habits and history of  ulcerative pancolitis of 30 years duration, on no medications. R/O bacterial overgroth, start Flagyl 250 mg by mouth 3 times a day  and Linzess 145 g daily for constipation. She will reduce Peri-Colace from 7 tablets a day to 2 or 3 a day  and she will reduce her carbohydrate intake and increase fiber intake  Ulcerative colitis of 30 year duration low grade dysplasia in 2008 and again in 2012. She is due for recall colonoscopy in December 2012 but we will proceed now to make sure that she does not have high grade dysplasia. She is currently on no medications for ulcerative colitis.  Gastroesophageal reflux. Protonix 40 mg twice a day. She needs to lose weight and follow antireflux measures specifically reduce her carbohydrate intake    Delfin Edis 03/01/2014

## 2014-03-01 NOTE — Telephone Encounter (Signed)
Now that pt is on metformin does she also need a glucometer

## 2014-03-01 NOTE — Patient Instructions (Addendum)
CC:  Dr Joan Flores have been scheduled for a colonoscopy. Please follow written instructions given to you at your visit today.  Please pick up your prep kit at the pharmacy within the next 1-3 days. If you use inhalers (even only as needed), please bring them with you on the day of your procedure. Your physician has requested that you go to www.startemmi.com and enter the access code given to you at your visit today. This web site gives a general overview about your procedure. However, you should still follow specific instructions given to you by our office regarding your preparation for the procedure. We have sent medications to your pharmacy for you to pick up at your convenience.

## 2014-03-04 NOTE — Telephone Encounter (Signed)
Please see below and advise.

## 2014-03-06 NOTE — Telephone Encounter (Signed)
It is not necessary but may be useful for her to keep an eye on sugars She should come in to see Vaughan Basta for basic diabetes teaching and use of monitor

## 2014-03-07 NOTE — Telephone Encounter (Signed)
Linda please see below, can you call her to schedule appointment with you.

## 2014-03-08 ENCOUNTER — Encounter: Payer: Self-pay | Admitting: Internal Medicine

## 2014-03-08 ENCOUNTER — Ambulatory Visit (AMBULATORY_SURGERY_CENTER): Payer: BLUE CROSS/BLUE SHIELD | Admitting: Internal Medicine

## 2014-03-08 VITALS — BP 148/98 | HR 88 | Resp 21

## 2014-03-08 DIAGNOSIS — D125 Benign neoplasm of sigmoid colon: Secondary | ICD-10-CM

## 2014-03-08 DIAGNOSIS — D122 Benign neoplasm of ascending colon: Secondary | ICD-10-CM

## 2014-03-08 DIAGNOSIS — K6389 Other specified diseases of intestine: Secondary | ICD-10-CM

## 2014-03-08 DIAGNOSIS — K51919 Ulcerative colitis, unspecified with unspecified complications: Secondary | ICD-10-CM

## 2014-03-08 MED ORDER — FLEET ENEMA 7-19 GM/118ML RE ENEM
1.0000 | ENEMA | Freq: Once | RECTAL | Status: AC
  Administered 2014-03-08: 1 via RECTAL

## 2014-03-08 MED ORDER — SODIUM CHLORIDE 0.9 % IV SOLN: 500.0000 mL | INTRAVENOUS | Status: DC

## 2014-03-08 NOTE — Op Note (Signed)
[facility]  [address]. [place], [address]   COLONOSCOPY PROCEDURE REPORT  PATIENT: Lindsay Fry, Lindsay Fry  MR#: [id number] BIRTHDATE: Sep 12, 1960 , 27  yrs. old GENDER: female ENDOSCOPIST: Lafayette Dragon, MD REFERRED TK:ZSWFU Vernell Morgans, M.D. PROCEDURE DATE:  03/08/2014 PROCEDURE:   Colonoscopy with snare polypectomy, Colonoscopy with cold biopsy polypectomy, and Colonoscopy with biopsy , incomplete exam to mid ascending colon First Screening Colonoscopy - Avg.  risk and is 50 yrs.  old or older - No.  Prior Negative Screening - Now for repeat screening. N/A  History of Adenoma - Now for follow-up colonoscopy & has been > or = to 3 yrs.  Yes hx of adenoma.  Has been 3 or more years since last colonoscopy.  Polyps Removed Today? Yes. ASA CLASS:   Class II INDICATIONS:ulcerative colitis since 1988.  The positive family history of colon cancer in patient's father and inflammatory bowel disease in her mother.  Prior colonoscopy in 2008 and 2012 showed tubular adenoma with low-grade dysplasia and patchy quiescent ulcerative colitis. MEDICATIONS: Monitored anesthesia care and Propofol 300 mg IV  DESCRIPTION OF PROCEDURE:   After the risks benefits and alternatives of the procedure were thoroughly explained, informed consent was obtained.  The digital rectal exam revealed no abnormalities of the rectum.   The LB [id number]  endoscope was introduced through the anus and advanced to the ascending colon. No adverse events experienced.   The quality of the prep was poor, using MoviPrep  The instrument was then slowly withdrawn as the colon was fully examined.      COLON FINDINGS: Three sessile polyps measuring 7 mm in size were found in the sigmoid colon x1 and ascending colon.x2  A polypectomy was performed with a cold snare.in left colon  The resection was complete, the polyp tissue was completely retrieved and sent to histology.  A polypectomy was performed with  a hot snare. in ascending colon The resection was complete, the polyp tissue was completely retrieved and sent to histology.  A polypectomy was performed with cold forceps in ascending colon.  The resection was complete, the polyp tissue was completely retrieved and sent to histology.  A biopsy was performed using cold forceps.  Sample was obtained and sent to histology.  Retroflexed views revealed no abnormalities.multiple biopsies were obtained from descending colon, transverse colon and descending colon and sigmoid colon to rule out dysplasia. Because was diffusely erythematous but there was no friability pseudopolyps or bleeding The time to cecum=16 minutes 0 seconds.  Withdrawal time=42 minutes 0 seconds.  The scope was withdrawn and the procedure completed.There was mild melanosis coli COMPLICATIONS: There were no immediate complications.  ENDOSCOPIC IMPRESSION: 1.moderately active ulcerative colitis predominantly in the left colon. Status post random biopsies from the sigmoid, descending, transverse and descending colon to rule out dysplasia 2. Incomplete exam due to poor prep as well as so hypotonic redundant colon with decreased abdominal muscle tone 3. 3 polyps removed 2 in the ascending colon and 1 in descending colon 4. Mild melanosis coli  RECOMMENDATIONS: Await pathology results this was a suboptimal exam due to poor prep as well as torturous and redundant colon. Depending on the pathology we will also repeat the colonoscopy within a year or earlier if we find dysplasia. We will also consider possibility of total colectomy considering the fact that patient has had ulcerative colitis for 28 years  eSigned:  Lafayette Dragon, MD 03/08/2014 9:24 AM   cc:   PATIENT NAME:  Lindsay Fry, Lindsay Fry MR#: [id number]

## 2014-03-08 NOTE — Progress Notes (Deleted)
Called to room to assist during endoscopic procedure.  Patient ID and intended procedure confirmed with present staff. Received instructions for my participation in the procedure from the performing physician.  

## 2014-03-08 NOTE — Progress Notes (Signed)
Told pt that she may have some abdominal muscle soreness d/t large amt of abdominal pressure applied to reach the cecum

## 2014-03-08 NOTE — Progress Notes (Signed)
Called to room to assist during endoscopic procedure.  Patient ID and intended procedure confirmed with present staff. Received instructions for my participation in the procedure from the performing physician.  

## 2014-03-08 NOTE — Progress Notes (Signed)
Awake talking stable to RR

## 2014-03-08 NOTE — Patient Instructions (Signed)
YOU HAD AN ENDOSCOPIC PROCEDURE TODAY AT THE [facility]: Refer to the procedure report that was given to you for any specific questions about what was found during the examination.  If the procedure report does not answer your questions, please call your gastroenterologist to clarify.  If you requested that your care partner not be given the details of your procedure findings, then the procedure report has been included in a sealed envelope for you to review at your convenience later.  YOU SHOULD EXPECT: Some feelings of bloating in the abdomen. Passage of more gas than usual.  Walking can help get rid of the air that was put into your GI tract during the procedure and reduce the bloating. If you had a lower endoscopy (such as a colonoscopy or flexible sigmoidoscopy) you may notice spotting of blood in your stool or on the toilet paper. If you underwent a bowel prep for your procedure, then you may not have a normal bowel movement for a few days.  DIET: Your first meal following the procedure should be a light meal and then it is ok to progress to your normal diet.  A half-sandwich or bowl of soup is an example of a good first meal.  Heavy or fried foods are harder to digest and may make you feel nauseous or bloated.  Likewise meals heavy in dairy and vegetables can cause extra gas to form and this can also increase the bloating.  Drink plenty of fluids but you should avoid alcoholic beverages for 24 hours.  ACTIVITY: Your care partner should take you home directly after the procedure.  You should plan to take it easy, moving slowly for the rest of the day.  You can resume normal activity the day after the procedure however you should NOT DRIVE or use heavy machinery for 24 hours (because of the sedation medicines used during the test).    SYMPTOMS TO REPORT IMMEDIATELY: A gastroenterologist can be reached at any hour.  During normal business hours, 8:30 AM to 5:00 PM Monday through Friday,  call [phone].  After hours and on weekends, please call the GI answering service at [phone] who will take a message and have the physician on call contact you.   Following lower endoscopy (colonoscopy or flexible sigmoidoscopy):  Excessive amounts of blood in the stool  Significant tenderness or worsening of abdominal pains  Swelling of the abdomen that is new, acute  Fever of 100F or higher  FOLLOW UP: If any biopsies were taken you will be contacted by phone or by letter within the next 1-3 weeks.  Call your gastroenterologist if you have not heard about the biopsies in 3 weeks.  Our staff will call the home number listed on your records the next business day following your procedure to check on you and address any questions or concerns that you may have at that time regarding the information given to you following your procedure. This is a courtesy call and so if there is no answer at the home number and we have not heard from you through the emergency physician on call, we will assume that you have returned to your regular daily activities without incident.  SIGNATURES/CONFIDENTIALITY: You and/or your care partner have signed paperwork which will be entered into your electronic medical record.  These signatures attest to the fact that that the information above on your After Visit Summary has been reviewed and is understood.  Full responsibility of the confidentiality of this   discharge information lies with you and/or your care-partner.  You might note some irritation in your nose or some drainage.  This may cause feelings of congestion.  This is from the oxygen, which can be irritating.  There is no need for concern, this should clear up in a day or so.  Continue your normal medications  Please call the office to set up a follow up appointment to discuss with Dr Olevia Perches possible surgery- after you receive your pathology letter  Please read over handouts about polyps

## 2014-03-09 ENCOUNTER — Telehealth: Payer: Self-pay

## 2014-03-09 NOTE — Telephone Encounter (Signed)
No answer, left voicemail message.

## 2014-03-14 ENCOUNTER — Other Ambulatory Visit: Payer: Self-pay | Admitting: Family Medicine

## 2014-03-14 NOTE — Telephone Encounter (Signed)
Ok to refill? Last prescribed on 09/16/13. Last seen on 12/23/13. No future appt.

## 2014-03-14 NOTE — Telephone Encounter (Signed)
She can have 180 with one refill  Schedule f/u late summer-thanks

## 2014-03-15 ENCOUNTER — Encounter: Payer: Self-pay | Admitting: Internal Medicine

## 2014-03-15 NOTE — Telephone Encounter (Signed)
Left voice mail for a call back

## 2014-03-15 NOTE — Telephone Encounter (Signed)
Patient called back. Schedule follow up on 08/01/14 and sent rx to [facility].

## 2014-03-22 NOTE — Telephone Encounter (Signed)
Message left on home number to call me if she would like to start testing her blood sugars, and I would be happy to give her a meter and show her how to do this.

## 2014-04-06 ENCOUNTER — Encounter: Payer: BLUE CROSS/BLUE SHIELD | Attending: Endocrinology | Admitting: Nutrition

## 2014-04-06 DIAGNOSIS — Z713 Dietary counseling and surveillance: Secondary | ICD-10-CM | POA: Insufficient documentation

## 2014-04-06 DIAGNOSIS — R7301 Impaired fasting glucose: Secondary | ICD-10-CM

## 2014-04-06 NOTE — Patient Instructions (Signed)
Test blood sugars before and 2hr. After one meal every other day--alternating meals each day

## 2014-04-06 NOTE — Progress Notes (Signed)
Pt. Was instructed on the use of the Verio IQ meter.  She had been using her husband's meter. She re demonstrated how to use this correctly, and her blood sugar was 187, 1 3/4 hours after lunch today She is wanting to see the dietitian, and was told to make an appt. Today when she leaves here.  She is keeping a food record through Weight watche'rs app on her phone.  She was encouraged to do some ac and 2hr. pc readings, and bring them to the dietitian visit.  She agreed to do this.  Goals for readings:  Ac: less than 115, and 2hr pc: less than 175. She had no final questions.

## 2014-04-12 ENCOUNTER — Ambulatory Visit: Payer: Self-pay | Admitting: Cardiovascular Disease

## 2014-04-14 ENCOUNTER — Encounter: Payer: Self-pay | Admitting: Endocrinology

## 2014-04-14 ENCOUNTER — Ambulatory Visit (INDEPENDENT_AMBULATORY_CARE_PROVIDER_SITE_OTHER): Payer: BLUE CROSS/BLUE SHIELD | Admitting: Endocrinology

## 2014-04-14 VITALS — BP 168/104 | HR 119 | Temp 98.3°F | Resp 16 | Ht 65.75 in | Wt 229.2 lb

## 2014-04-14 DIAGNOSIS — E890 Postprocedural hypothyroidism: Secondary | ICD-10-CM

## 2014-04-14 DIAGNOSIS — R7301 Impaired fasting glucose: Secondary | ICD-10-CM | POA: Diagnosis not present

## 2014-04-14 DIAGNOSIS — E119 Type 2 diabetes mellitus without complications: Secondary | ICD-10-CM

## 2014-04-14 LAB — GLUCOSE, POCT (MANUAL RESULT ENTRY): POC Glucose: 133 mg/dl — AB (ref 70–99)

## 2014-04-14 LAB — TSH: TSH: 1.21 u[IU]/mL (ref 0.35–4.50)

## 2014-04-14 MED ORDER — SITAGLIP PHOS-METFORMIN HCL ER 100-1000 MG PO TB24: ORAL_TABLET | ORAL | Status: DC

## 2014-04-14 NOTE — Progress Notes (Signed)
Patient ID: Lindsay Fry, female   DOB: [birth year], 54 y.o.   MRN: [id number]   Reason for Appointment:  Hypothyroidism, followup visit  History of Present Illness:  HYPOTHYROIDISM: This was first diagnosed in 1992 after her treatment for Graves' disease with I-131 She has been on relatively large doses of thyroxine supplements She was on 224 mcg since 11/13 and her TSH was normal in 3/14 Subjectively difficult to assess her thyroid since she tends to have fatigue chronically.  In 2014 her dose was reduced to 200 mcg daily and her dose has been fluctuating since then She is now on 200 mcg, 7 tablets a week since 04/2013  She is still on the brand name Synthroid  Her symptoms are variable and usually nonspecific for hypothyroidism, mostlyrelated to other medical problems including sleep disturbances and bipolar illness.  She was feeling more fatigued on her last visit and her TSH was relatively high at 7.6 despite her being very compliant with her medication She has her usual hot flashes          She is taking the tablet in the morning daily before breakfast without any multivitamins or iron.   Because of her high TSH her dose was increased by 25 g in 2/16  Lab Results  Component Value Date   TSH 7.64* 02/17/2014   TSH 1.47 07/29/2013   TSH 8.09* 05/12/2013   FREET4 0.99 02/17/2014   FREET4 1.02 07/29/2013   FREET4 1.01 04/05/2013     DIABETES: Because of her high A1c of 6.7 she was sent for a glucose tolerance test and this was significantly abnormal with fasting glucose 164 and 1 hour reading of 300. She was started on metformin and she has increased the dose progressively to 1500 mg without side effects She has just started monitoring her blood sugar after instructions from nurse educatorbut is only doing fasting readings Recent readings:124-156 fasting with 1 pc 187  She also is trying to improve diet with reducing carbohydrates and calories and has lost  weight on this visit Has not however been motivated to start walking or exercise but is planning to do so with her husband Weight history:  Wt Readings from Last 3 Encounters:  04/14/14 229 lb 3.2 oz (103.964 kg)  03/01/14 239 lb (108.41 kg)  02/17/14 238 lb 6.4 oz (108.138 kg)   LABS:  Lab Results  Component Value Date   HGBA1C 6.7* 02/17/2014   HGBA1C 6.2 07/29/2013   HGBA1C 5.6 12/22/2012   Lab Results  Component Value Date   LDLCALC 112* 05/12/2013   CREATININE 0.9 07/29/2013    No visits with results within 1 Week(s) from this visit. Latest known visit with results is:  Appointment on 02/23/2014  Component Date Value Ref Range Status  . Glucose, Fasting 02/23/2014 164* 70 - 99 mg/dL Final   per dr Dwyane Dee ok to continue ggt  . Glucose, 1 Hour GTT 02/23/2014 300   Final  . Glucose, 2 hour 02/23/2014 265   Final      Medication List       This list is accurate as of: 04/14/14  3:42 PM.  Always use your most recent med list.               amLODipine 10 MG tablet  Commonly known as:  NORVASC  TAKE 1 TABLET BY MOUTH EVERY NIGHT AT BEDTIME     aspirin 81 MG tablet  Take 81 mg by mouth daily.  atorvastatin 40 MG tablet  Commonly known as:  LIPITOR  TAKE  1/2 TABLET BY MOUTH EVERY DAY     b complex vitamins capsule  Take 1 capsule by mouth daily.     buPROPion 150 MG 24 hr tablet  Commonly known as:  WELLBUTRIN XL  Take 450 mg by mouth daily.     chlorproMAZINE 25 MG tablet  Commonly known as:  THORAZINE  Take 25-50 mg by mouth as needed. For migraines if frova doesn't help.     cloNIDine 0.1 MG tablet  Commonly known as:  CATAPRES  Take 0.05 mg by mouth 2 (two) times daily.     co-enzyme Q-10 50 MG capsule  Take 50 mg by mouth daily.     cyclobenzaprine 10 MG tablet  Commonly known as:  FLEXERIL  TAKE 2 TABLETS BY MOUTH EVERY NIGHT AT BEDTIME     diazepam 10 MG tablet  Commonly known as:  VALIUM  Take 2.5-10 mg by mouth as needed for  anxiety.     diphenhydrAMINE 25 MG tablet  Commonly known as:  BENADRYL  Take 50 mg by mouth at bedtime.     fluticasone 50 MCG/ACT nasal spray  Commonly known as:  FLONASE  USE 2 SPRAYS IN EACH NOSTRIL TWICE DAILY.     GAS-X PO  Take 1 tablet by mouth 4 (four) times daily.     lamoTRIgine 200 MG tablet  Commonly known as:  LAMICTAL  Take 400 mg by mouth daily.     latanoprost 0.005 % ophthalmic solution  Commonly known as:  XALATAN  Place 1 drop into both eyes At bedtime.     levothyroxine 25 MCG tablet  Commonly known as:  LEVOTHROID  Take 1 tablet daily before breakfast with the 200 mcg     SYNTHROID 200 MCG tablet  Generic drug:  levothyroxine  TAKE 1 TABLET BY MOUTH EVERY DAY BEFORE BREAKFAST     Linaclotide 145 MCG Caps capsule  Commonly known as:  LINZESS  Take 1 capsule (145 mcg total) by mouth daily.     metFORMIN 500 MG tablet  Commonly known as:  GLUCOPHAGE  Take 1 tablet (500 mg) 1 time daily for 5 days, as tolerated move up to 2 tablets daily for 5 days and then to 3 tablets. As tolerated.     metroNIDAZOLE 250 MG tablet  Commonly known as:  FLAGYL  Take 1 tablet (250 mg total) by mouth 3 (three) times daily.     multivitamin tablet  Take 1 tablet by mouth daily.     naratriptan 2.5 MG tablet  Commonly known as:  AMERGE  Take 2.5 mg by mouth 2 (two) times daily as needed for migraine. Take one (1) tablet at onset of headache; if returns or does not resolve, may repeat after 4 hours; do not exceed five (5) mg in 24 hours.     nitrofurantoin 100 MG capsule  Commonly known as:  MACRODANTIN  Take 100 mg by mouth as directed. With sexual activity     Omega-3 Krill Oil 500 MG Caps  Take 500 mg by mouth.     pantoprazole 40 MG tablet  Commonly known as:  PROTONIX  Take 1 tablet (40 mg total) by mouth 2 (two) times daily.     PROBIOTIC DAILY PO  Take by mouth.     SALONPAS PAIN RELIEF PATCH EX  Apply topically as needed.     SAPHRIS 10 MG Subl    Generic  drug:  Asenapine Maleate  Place 20 mg under the tongue at bedtime.     spironolactone 25 MG tablet  Commonly known as:  ALDACTONE  Take 25 mg by mouth 2 (two) times daily.     traMADol 50 MG tablet  Commonly known as:  ULTRAM  TAKE 1 TABLET BY MOUTH FOUR TIMES DAILY AS NEEDED     TYLENOL ARTHRITIS PAIN 650 MG CR tablet  Generic drug:  acetaminophen  Take 1,300 mg by mouth 2 (two) times daily as needed.     valACYclovir 500 MG tablet  Commonly known as:  VALTREX  Take 500 mg by mouth 2 (two) times daily.     vitamin C 500 MG tablet  Commonly known as:  ASCORBIC ACID  Take 250 mg by mouth daily. Takes 2 a day     Vitamin D3 2000 UNITS Tabs  Take 1 tablet by mouth daily.     VYVANSE 50 MG capsule  Generic drug:  lisdexamfetamine     zolpidem 10 MG tablet  Commonly known as:  AMBIEN  Take 10 mg by mouth at bedtime.        Allergies:  Allergies  Allergen Reactions  . Aspirin     REACTION: aggrivates colitis  . Caffeine   . Crestor [Rosuvastatin]     REACTION: increased lfts  . Erythromycin     REACTION: GI upset  . Nsaids     REACTION: aggrivate colitis    Past Medical History  Diagnosis Date  . Rosacea   . Family history of malignant neoplasm of gastrointestinal tract   . Allergic rhinitis, cause unspecified   . Anxiety state, unspecified   . Unspecified asthma(493.90)   . Benign neoplasm of other and unspecified site of the digestive system   . Ulcerative colitis, unspecified   . Depression   . Fibromyalgia   . GERD (gastroesophageal reflux disease)   . Hiatal hernia   . Pure hypercholesterolemia   . Unspecified essential hypertension   . Unspecified hypothyroidism   . Insomnia, unspecified   . History of migraines   . Other screening mammogram   . Asymptomatic postmenopausal status (age-related) (natural)   . Unspecified vitamin D deficiency   . Edema   . Dysuria   . History of ovarian cyst   . Cyst     on Achilles tendon  . Tubular  adenoma of colon 01/10/11  . Vertigo   . Subacute confusional state 11/19/2012  . ADD (attention deficit disorder) 11/19/2012  . OCD (obsessive compulsive disorder)   . Glaucoma   . History of alcoholism   . Bipolar disorder   . Diabetes mellitus     frank    Past Surgical History  Procedure Laterality Date  . Wisdom tooth extraction  1980's  . Tonsillectomy  1984  . Cesarean section  1996/1997    placenta previa, gest DM, pre-eclampsia  . Nasal sinus surgery  05/1997  . Precancerous mole removed    . Cholecystectomy  1997    adhesions also  . Meckel diverticulum excision  1999  . Breast biopsy      on tamoxifen, atypical hyperplasia  . Dexa  04/1999 and 2010    normal  . Colonoscopy  01/2001    Ulcerative colitis  . Esophagogastroduodenoscopy  09/2001    polyp  . Exercise stress test  11/2003    negative  . Nuclear stress test  06/2007    negative  . Colonoscopy  04/2004  UC, polyp  . Colonoscopy  12/08    UC, no polyps  . Sleep study  11/09    no apnea, but did snore (done by [facility])  . Bunionectomy Right   . Femur fracture surgery Left     Family History  Problem Relation Age of Onset  . Coronary artery disease Father   . Colon cancer Father   . Alzheimer's disease Father   . Dementia Father   . Coronary artery disease Mother   . Hypertension Mother   . Osteoporosis Mother   . Colitis Mother   . Crohn's disease Mother   . Breast cancer      great aunts  . Coronary artery disease      Uncle (also AAA)  . Diabetes      remote family history  . Stroke Cousin   . Esophageal cancer Neg Hx   . Rectal cancer Neg Hx   . Stomach cancer Neg Hx     Social History:  reports that she quit smoking about 21 years ago. Her smoking use included Cigarettes. She has a 1.25 pack-year smoking history. She has never used smokeless tobacco. She reports that she does not drink alcohol or use illicit drugs.  REVIEW Of SYSTEMS:  Blood pressure is markedly high but she  did not take her clonidine this morning   Examination:   BP 168/104 mmHg  Pulse 119  Temp(Src) 98.3 F (36.8 C)  Resp 16  Ht 5' 5.75" (1.67 m)  Wt 229 lb 3.2 oz (103.964 kg)  BMI 37.28 kg/m2  She looks welll    Assessments   Hypothyroidism, post ablative:  She has had long-standing hypothyroidism and has needed somewhat variable doses  Currently she is less fatigue with increasing her dose by 25 g when her TSH was mildly increased She has recently been able to lose weight also   For now will check her TSH again today and decide on further management  DIABETES: She has mild diabetes and is tolerating metformin 1500 mg a day Baseline A1c is 6.7 However her blood sugars are still not near normal and considering her recent diagnosis and age she needs to have further improvement in her blood sugars Also discussed that metformin may not consistently control blood sugar long-term She has lost weight with dietarychanges and does need to start an exercise program also  Discussed needing to check blood sugars after meals more often rather than fasting and discussed blood sugar targets  For simplicity she can switch to Janumet XR 100/1000   Patient Instructions  Please check blood sugars at least half the time about 2 hours after any meal and 3 times per week on waking up.  Please bring blood sugar monitor to each visit. Recommended blood sugar levels about 2 hours after meal is 140-160 and on waking up 90-120  Walk daily    Counseling time over 50% of today's 25 minute visit  eKUMAR,Vernisha Bacote 04/14/2014, 3:42 PM   Addendum:  TSH normal

## 2014-04-14 NOTE — Patient Instructions (Signed)
Please check blood sugars at least half the time about 2 hours after any meal and 3 times per week on waking up.  Please bring blood sugar monitor to each visit. Recommended blood sugar levels about 2 hours after meal is 140-160 and on waking up 90-120  Walk daily

## 2014-04-15 ENCOUNTER — Encounter: Payer: BLUE CROSS/BLUE SHIELD | Admitting: Dietician

## 2014-04-15 ENCOUNTER — Telehealth: Payer: Self-pay | Admitting: Endocrinology

## 2014-04-15 ENCOUNTER — Other Ambulatory Visit: Payer: Self-pay | Admitting: *Deleted

## 2014-04-15 HISTORY — PX: BREAST BIOPSY: SHX20

## 2014-04-15 MED ORDER — GLUCOSE BLOOD VI STRP: ORAL_STRIP | Status: DC

## 2014-04-15 NOTE — Telephone Encounter (Signed)
Patient need refill of test strips Verio IQ

## 2014-04-19 ENCOUNTER — Encounter: Payer: Self-pay | Admitting: Dietician

## 2014-04-19 ENCOUNTER — Encounter: Payer: BLUE CROSS/BLUE SHIELD | Attending: Endocrinology | Admitting: Dietician

## 2014-04-19 VITALS — Ht 66.76 in | Wt 232.0 lb

## 2014-04-19 DIAGNOSIS — Z713 Dietary counseling and surveillance: Secondary | ICD-10-CM | POA: Diagnosis not present

## 2014-04-19 DIAGNOSIS — R7301 Impaired fasting glucose: Secondary | ICD-10-CM | POA: Insufficient documentation

## 2014-04-19 DIAGNOSIS — E119 Type 2 diabetes mellitus without complications: Secondary | ICD-10-CM

## 2014-04-19 NOTE — Patient Instructions (Signed)
Plan:  Aim for 2-3 Carb Choices per meal (30-45 grams) +/- 1 either way  Aim for 0-2 Carbs per snack if hungry  Include protein in moderation with your meals and snacks Consider reading food labels for Total Carbohydrate and Fat Grams of foods Continue the 20 minutes of walking daily as tolerated.  Consider adding a short walk in the afternoon/evening. Consider checking BG at alternate times per day as directed by MD  Consider taking medication  as directed by MD  Spread Carbohydrates throughout the day.  Try to have something light between breakfast and dinner (Peanut butter and banana wrap or plain yogurt and fruit) Consider PB2 at evening snack with carbohydrate.

## 2014-04-19 NOTE — Progress Notes (Signed)
  Medical Nutrition Therapy:  Appt start time: 0370 end time:  4888.   Assessment:  Primary concerns today: Patient newly diagnosed with type 2 diabetes about 6 weeks ago.  Patient testing blood sugar 3 times per week fasting and 3 times 2 hours after meals.  113-156 before meals and 123-140 after meals.  Hx includes hypothyroid, fibromilagia, ulcerative colitis, GERD and bipolar. Colonoscopy recently and possible colectomy in the future due to pre cancerous polyps  233 lbs 6 weeks ago and 226 lbs this am.  HgbA1C 6.7% 02/17/14.  Hx of GDM with both pregnancies.  Was on Abilify she had increased blood sugar.    Was caring for mother until January with her passing.  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  Patient lives with husband and 2 sons. Everyone works second shift except for patient.  Biggest meals are on the weekends.    Preferred Learning Style:   No preference indicated   Learning Readiness:   Ready  Change in progress   MEDICATIONS: see list   DIETARY INTAKE: Patient has reduced sugar and fat since diagnosis.  She is on Weight Watchers currently.  Follows a low fat diet.  Not hungry until Vivance wears off.  Avoids artificial sweeteners except for stevia.  Likes PB2,  24-hr recall:  B (91:69-4 AM): eggs, 2 slices bacon, 1/4 cup grits or [place] sausage and eggs and 1/4 cup grits. Or almond milk and cereal Snk ( AM): L ( PM): fruit or wrap with PB2 and whole banana Snk ( PM): D (6:30-10:30 PM): Lean Cuisine or grilled chicken, green beans, salad Snk (10-11:30 PM): banana or plain yogurt and fruit or steamed non starchy veges or [place] crackers and almond milk Beverages: diet caffeine free soda, 32 oz water, rare unswt tea or coffee  Usual physical activity: Treadmill 20 minutes daily  Estimated energy needs: 1400 calories 158 g carbohydrates 105 g protein 39 g fat  Progress Towards Goal(s):  In progress.   Nutritional Diagnosis:  NB-1.1 Food and  nutrition-related knowledge deficit As related to balance of carbohydrate, protein, and fat.  As evidenced by newly diagnosed DM.    Intervention:  Nutrition counseling and diabetes education initiated. Discussed Carb Counting by food group as method of portion control, reading food labels, and benefits of increased activity. Also discussed  A1c.  . Plan:  Aim for 2-3 Carb Choices per meal (30-45 grams) +/- 1 either way  Aim for 0-2 Carbs per snack if hungry  Include protein in moderation with your meals and snacks Consider reading food labels for Total Carbohydrate and Fat Grams of foods Continue the 20 minutes of walking daily as tolerated.  Consider adding a short walk in the afternoon/evening. Consider checking BG at alternate times per day as directed by MD  Consider taking medication  as directed by MD  Spread Carbohydrates throughout the day.  Try to have something light between breakfast and dinner (Peanut butter and banana wrap or plain yogurt and fruit) Consider PB2 at evening snack with carbohydrate.   Teaching Method Utilized:  Visual Auditory Hands on  Handouts given during visit include:  My plate  Meal plan card  Label reading  Snack list  HgbA1C sheet  Barriers to learning/adherence to lifestyle change: none  Demonstrated degree of understanding via:  Teach Back   Monitoring/Evaluation:  Dietary intake, exercise, label reading, and body weight prn.

## 2014-04-22 ENCOUNTER — Ambulatory Visit: Payer: BLUE CROSS/BLUE SHIELD | Admitting: Endocrinology

## 2014-04-26 ENCOUNTER — Ambulatory Visit
Admit: 2014-04-26 | Disposition: A | Discharge status: Home / Self Care | Payer: Self-pay | Admitting: Unknown Physician Specialty

## 2014-04-27 ENCOUNTER — Encounter: Payer: Self-pay | Admitting: Cardiovascular Disease

## 2014-04-27 ENCOUNTER — Ambulatory Visit (INDEPENDENT_AMBULATORY_CARE_PROVIDER_SITE_OTHER): Payer: BLUE CROSS/BLUE SHIELD | Admitting: Cardiovascular Disease

## 2014-04-27 VITALS — BP 118/78 | HR 103 | Ht 66.5 in | Wt 229.5 lb

## 2014-04-27 DIAGNOSIS — E7800 Pure hypercholesterolemia, unspecified: Secondary | ICD-10-CM

## 2014-04-27 DIAGNOSIS — R7301 Impaired fasting glucose: Secondary | ICD-10-CM | POA: Diagnosis not present

## 2014-04-27 DIAGNOSIS — R000 Tachycardia, unspecified: Secondary | ICD-10-CM | POA: Insufficient documentation

## 2014-04-27 DIAGNOSIS — E669 Obesity, unspecified: Secondary | ICD-10-CM | POA: Diagnosis not present

## 2014-04-27 DIAGNOSIS — I10 Essential (primary) hypertension: Secondary | ICD-10-CM | POA: Diagnosis not present

## 2014-04-27 DIAGNOSIS — I471 Supraventricular tachycardia: Secondary | ICD-10-CM

## 2014-04-27 NOTE — Assessment & Plan Note (Signed)
Encouraged her to stay on her Lipitor

## 2014-04-27 NOTE — Assessment & Plan Note (Signed)
Elevated heart rate on today's visit. She'll monitor her heart rate at rest at home. Recommended she call our office if heart rate continues to run high.  Potentially could change amlodipine to diltiazem or verapamil if needed

## 2014-04-27 NOTE — Assessment & Plan Note (Signed)
Blood pressure is well controlled on today's visit. No changes made to the medications. 

## 2014-04-27 NOTE — Assessment & Plan Note (Signed)
Weight down 9 pounds per the patient. We have encouraged continued exercise, careful diet management in an effort to lose weight.

## 2014-04-27 NOTE — Progress Notes (Signed)
Patient ID: Lindsay Fry, female    DOB: 03/26/1960, 54 y.o.   MRN: [id number]  HPI Comments: Lindsay Fry is a very pleasant 54 year old woman with history of obesity, hypertension, hyperlipidemia, strong family history of heart disease, father who had bypass surgery at 93, previous symptoms of chest pain and shortness of breath. Negative stress Myoview in August 2014 showing no ischemia Also with a history of migraines She presents today for follow-up of her hypertension, hyperlipidemia, risk factors  She reports that she is losing weight, approximately 9 pounds through dietary changes and weight watchers. Hemoglobin A1c was up to 6.7. She is trying to get this lower. She is taking Janumet She does 20 minutes on a treadmill relatively routinely. Does not do more because she gets bored She is concerned as she has to do breast biopsy soon based off a recent mammogram Also was told that there is concerned about recent colon polyps given her family history of colon cancer.  EKG on today's visit shows sinus tachycardia with rate 10 3 bpm, left axis deviation, nonspecific T-wave abnormality  Other past medical history Previously had left arm pain coming on at rest. This seemed to resolve with NSAIDs. Also with several episodes of higher blood pressure, possibly associated with stress. He states that she has been taking clonidine once a day in the evening, amlodipine in the morning  Vertigo symptoms occurred in early 2014 and recurrent August 9. She is taking meclizine. She has dizziness and feels that she is drunk when she stands up.  History of hyperlipidemia. In June 2010, total cholesterol 314.  Remote smoking for a short period of time, none recently.   Allergies  Allergen Reactions  . Aspirin     REACTION: aggrivates colitis  . Caffeine   . Crestor [Rosuvastatin]     REACTION: increased lfts  . Erythromycin     REACTION: GI upset  . Nsaids     REACTION: aggrivate colitis     Outpatient Encounter Prescriptions as of 04/27/2014  Medication Sig  . acetaminophen (TYLENOL ARTHRITIS PAIN) 650 MG CR tablet Take 1,300 mg by mouth 2 (two) times daily as needed.   Marland Kitchen amLODipine (NORVASC) 10 MG tablet TAKE 1 TABLET BY MOUTH EVERY NIGHT AT BEDTIME  . Asenapine Maleate (SAPHRIS) 10 MG SUBL Place 20 mg under the tongue at bedtime.  Marland Kitchen aspirin 81 MG tablet Take 81 mg by mouth daily.  Marland Kitchen atorvastatin (LIPITOR) 40 MG tablet TAKE  1/2 TABLET BY MOUTH EVERY DAY  . b complex vitamins capsule Take 1 capsule by mouth daily.  Marland Kitchen buPROPion (WELLBUTRIN XL) 150 MG 24 hr tablet Take 450 mg by mouth daily.   . chlorproMAZINE (THORAZINE) 25 MG tablet Take 25-50 mg by mouth as needed. For migraines if frova doesn't help.  . Cholecalciferol (VITAMIN D3) 2000 UNITS TABS Take 1 tablet by mouth daily.  . cloNIDine (CATAPRES) 0.1 MG tablet Take 0.05 mg by mouth 2 (two) times daily.  Marland Kitchen co-enzyme Q-10 50 MG capsule Take 50 mg by mouth daily.  . cyclobenzaprine (FLEXERIL) 10 MG tablet TAKE 2 TABLETS BY MOUTH EVERY NIGHT AT BEDTIME  . diazepam (VALIUM) 10 MG tablet Take 2.5-10 mg by mouth as needed for anxiety.  . diphenhydrAMINE (BENADRYL) 25 MG tablet Take 25 mg by mouth at bedtime.   . fluticasone (FLONASE) 50 MCG/ACT nasal spray USE 2 SPRAYS IN EACH NOSTRIL TWICE DAILY.  Marland Kitchen glucose blood (ONETOUCH VERIO) test strip Use as instructed to check blood  sugar twice a day  . lamoTRIgine (LAMICTAL) 200 MG tablet Take 400 mg by mouth daily.  Marland Kitchen latanoprost (XALATAN) 0.005 % ophthalmic solution Place 1 drop into both eyes At bedtime.  Marland Kitchen levothyroxine (LEVOTHROID) 25 MCG tablet Take 1 tablet daily before breakfast with the 200 mcg  . Linaclotide (LINZESS) 145 MCG CAPS capsule Take 1 capsule (145 mcg total) by mouth daily.  . Liniments (SALONPAS PAIN RELIEF PATCH EX) Apply topically as needed.  . Multiple Vitamin (MULTIVITAMIN) tablet Take 1 tablet by mouth daily.    . naratriptan (AMERGE) 2.5 MG tablet Take  2.5 mg by mouth 2 (two) times daily as needed for migraine. Take one (1) tablet at onset of headache; if returns or does not resolve, may repeat after 4 hours; do not exceed five (5) mg in 24 hours.  . nitrofurantoin (MACRODANTIN) 100 MG capsule Take 100 mg by mouth as directed. With sexual activity   . Omega-3 Krill Oil 500 MG CAPS Take 500 mg by mouth.  . pantoprazole (PROTONIX) 40 MG tablet Take 1 tablet (40 mg total) by mouth 2 (two) times daily.  . Probiotic Product (PROBIOTIC DAILY PO) Take by mouth.  . Simethicone (GAS-X PO) Take 1 tablet by mouth 4 (four) times daily.  . SitaGLIPtin-MetFORMIN HCl [phone] MG TB24 1 at dinner daily  . spironolactone (ALDACTONE) 25 MG tablet Take 25 mg by mouth 2 (two) times daily.    Marland Kitchen SYNTHROID 200 MCG tablet TAKE 1 TABLET BY MOUTH EVERY DAY BEFORE BREAKFAST  . traMADol (ULTRAM) 50 MG tablet TAKE 1 TABLET BY MOUTH FOUR TIMES DAILY AS NEEDED  . valACYclovir (VALTREX) 500 MG tablet Take 500 mg by mouth 2 (two) times daily.    . vitamin C (ASCORBIC ACID) 500 MG tablet Take 250 mg by mouth daily. Takes 2 a day  . VYVANSE 50 MG capsule   . zolpidem (AMBIEN) 10 MG tablet Take 10 mg by mouth at bedtime.  . [DISCONTINUED] metroNIDAZOLE (FLAGYL) 250 MG tablet Take 1 tablet (250 mg total) by mouth 3 (three) times daily. (Patient not taking: Reported on 04/27/2014)    Past Medical History  Diagnosis Date  . Rosacea   . Family history of malignant neoplasm of gastrointestinal tract   . Allergic rhinitis, cause unspecified   . Anxiety state, unspecified   . Unspecified asthma(493.90)   . Benign neoplasm of other and unspecified site of the digestive system   . Ulcerative colitis, unspecified   . Depression   . Fibromyalgia   . GERD (gastroesophageal reflux disease)   . Hiatal hernia   . Pure hypercholesterolemia   . Unspecified essential hypertension   . Unspecified hypothyroidism   . Insomnia, unspecified   . History of migraines   . Other screening  mammogram   . Asymptomatic postmenopausal status (age-related) (natural)   . Unspecified vitamin D deficiency   . Edema   . Dysuria   . History of ovarian cyst   . Cyst     on Achilles tendon  . Tubular adenoma of colon 01/10/11  . Vertigo   . Subacute confusional state 11/19/2012  . ADD (attention deficit disorder) 11/19/2012  . OCD (obsessive compulsive disorder)   . Glaucoma   . History of alcoholism   . Bipolar disorder   . Diabetes mellitus     frank    Past Surgical History  Procedure Laterality Date  . Wisdom tooth extraction  1980's  . Tonsillectomy  1984  . Cesarean section  1996/1997  placenta previa, gest DM, pre-eclampsia  . Nasal sinus surgery  05/1997  . Precancerous mole removed    . Cholecystectomy  1997    adhesions also  . Meckel diverticulum excision  1999  . Breast biopsy      on tamoxifen, atypical hyperplasia  . Dexa  04/1999 and 2010    normal  . Colonoscopy  01/2001    Ulcerative colitis  . Esophagogastroduodenoscopy  09/2001    polyp  . Exercise stress test  11/2003    negative  . Nuclear stress test  06/2007    negative  . Colonoscopy  04/2004    UC, polyp  . Colonoscopy  12/08    UC, no polyps  . Sleep study  11/09    no apnea, but did snore (done by [facility])  . Bunionectomy Right   . Femur fracture surgery Left     Social History  reports that she quit smoking about 21 years ago. Her smoking use included Cigarettes. She has a 1.25 pack-year smoking history. She has never used smokeless tobacco. She reports that she does not drink alcohol or use illicit drugs.  Family History family history includes Alzheimer's disease in her father; Breast cancer in an other family member; Colitis in her mother; Colon cancer in her father; Coronary artery disease in her father, mother, and another family member; Crohn's disease in her mother; Dementia in her father; Diabetes in an other family member; Hypertension in her mother; Osteoporosis in her  mother; Stroke in her cousin. There is no history of Esophageal cancer, Rectal cancer, or Stomach cancer.    Review of Systems  Constitutional: Negative.   HENT: Negative.   Eyes: Negative.   Respiratory: Negative.   Cardiovascular: Negative.   Gastrointestinal: Negative.   Endocrine: Negative.   Musculoskeletal: Positive for gait problem.  Skin: Negative.   Allergic/Immunologic: Negative.   Neurological: Negative.   Hematological: Negative.   Psychiatric/Behavioral: Negative.   All other systems reviewed and are negative.   BP 118/78 mmHg  Pulse 103  Ht 5' 6.5" (1.689 m)  Wt 229 lb 8 oz (104.101 kg)  BMI 36.49 kg/m2  Physical Exam  Constitutional: She is oriented to person, place, and time. She appears well-developed and well-nourished.  HENT:  Head: Normocephalic.  Nose: Nose normal.  Mouth/Throat: Oropharynx is clear and moist.  Eyes: Conjunctivae are normal. Pupils are equal, round, and reactive to light.  Neck: Normal range of motion. Neck supple. No JVD present.  Cardiovascular: Normal rate, regular rhythm, S1 normal, S2 normal, normal heart sounds and intact distal pulses.  Exam reveals no gallop and no friction rub.   No murmur heard. Pulmonary/Chest: Effort normal and breath sounds normal. No respiratory distress. She has no wheezes. She has no rales. She exhibits no tenderness.  Abdominal: Soft. Bowel sounds are normal. She exhibits no distension. There is no tenderness.  Musculoskeletal: Normal range of motion. She exhibits no edema or tenderness.  Lymphadenopathy:    She has no cervical adenopathy.  Neurological: She is alert and oriented to person, place, and time. Coordination normal.  Skin: Skin is warm and dry. No rash noted. No erythema.  Psychiatric: She has a normal mood and affect. Her behavior is normal. Judgment and thought content normal.    Assessment and Plan  Nursing note and vitals reviewed.

## 2014-04-27 NOTE — Assessment & Plan Note (Signed)
Encouraged her to continue her exercise and weight loss for improved control

## 2014-04-27 NOTE — Patient Instructions (Signed)
You are doing well. No medication changes were made.  Please monitor your heart rate at home, Call the office if it runs high  Please call us if you have new issues that need to be addressed before your next appt.  Your physician wants you to follow-up in: 12 months.  You will receive a reminder letter in the mail two months in advance. If you don't receive a letter, please call our office to schedule the follow-up appointment.

## 2014-04-28 ENCOUNTER — Encounter: Payer: Self-pay | Admitting: General Surgery

## 2014-04-28 ENCOUNTER — Ambulatory Visit (INDEPENDENT_AMBULATORY_CARE_PROVIDER_SITE_OTHER): Payer: BLUE CROSS/BLUE SHIELD | Admitting: General Surgery

## 2014-04-28 ENCOUNTER — Other Ambulatory Visit: Payer: BLUE CROSS/BLUE SHIELD

## 2014-04-28 VITALS — BP 138/74 | HR 74 | Resp 16 | Ht 66.5 in | Wt 232.0 lb

## 2014-04-28 DIAGNOSIS — N6092 Unspecified benign mammary dysplasia of left breast: Secondary | ICD-10-CM

## 2014-04-28 DIAGNOSIS — R928 Other abnormal and inconclusive findings on diagnostic imaging of breast: Secondary | ICD-10-CM | POA: Diagnosis not present

## 2014-04-28 DIAGNOSIS — N6320 Unspecified lump in the left breast, unspecified quadrant: Secondary | ICD-10-CM

## 2014-04-28 DIAGNOSIS — N62 Hypertrophy of breast: Secondary | ICD-10-CM | POA: Diagnosis not present

## 2014-04-28 NOTE — Patient Instructions (Signed)
The patient is aware to call back for any questions or concerns.  

## 2014-04-28 NOTE — Progress Notes (Signed)
Patient ID: Lindsay Fry, female   DOB: 11/23/1960, 54 y.o.   MRN: [id number]  Chief Complaint  Patient presents with  . Breast Problem    adnormal mammogram    HPI Lindsay P Folino is a 54 y.o. female.  who presents for a breast evaluation. The most recent mammogram was done on 04-26-14. She had an ultrasound as well. Patient does perform regular self breast checks and gets regular mammograms done.  She could not feel anything in the breast prior to mammogram. 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 She is here today with her husband, Alan.  HPI  Past Medical History  Diagnosis Date  . Rosacea   . Family history of malignant neoplasm of gastrointestinal tract   . Allergic rhinitis, cause unspecified   . Anxiety state, unspecified   . Unspecified asthma(493.90)   . Benign neoplasm of other and unspecified site of the digestive system   . Ulcerative colitis, unspecified   . Depression   . Fibromyalgia   . GERD (gastroesophageal reflux disease)   . Hiatal hernia   . Pure hypercholesterolemia   . Unspecified essential hypertension   . Unspecified hypothyroidism   . Insomnia, unspecified   . History of migraines   . Other screening mammogram   . Asymptomatic postmenopausal status (age-related) (natural)   . Unspecified vitamin D deficiency   . Edema   . Dysuria   . History of ovarian cyst   . Cyst     on Achilles tendon  . Tubular adenoma of colon 01/10/11  . Vertigo   . Subacute confusional state 11/19/2012  . ADD (attention deficit disorder) 11/19/2012  . OCD (obsessive compulsive disorder)   . Glaucoma   . History of alcoholism   . Bipolar disorder   . Diabetes mellitus     frank  . Atypical hyperplasia of left breast 2000    Past Surgical History  Procedure Laterality Date  . Wisdom tooth extraction  1980's  . Tonsillectomy  1984  . Cesarean section  1996/1997    placenta previa, gest DM,  pre-eclampsia  . Nasal sinus surgery  05/1997  . Precancerous mole removed    . Cholecystectomy  1997    adhesions also  . Meckel diverticulum excision  1999  . Dexa  04/1999 and 2010    normal  . Colonoscopy  01/2001    Ulcerative colitis  . Esophagogastroduodenoscopy  09/2001    polyp  . Exercise stress test  11/2003    negative  . Nuclear stress test  06/2007    negative  . Colonoscopy  04/2004    UC, polyp  . Colonoscopy  12/08    UC, no polyps  . Sleep study  11/09    no apnea, but did snore (done by [facility])  . Bunionectomy Right   . Femur fracture surgery Left   . Appendectomy    . Breast biopsy  Aug 2000    on tamoxifen, atypical hyperplasia  . Breast surgery Left Aug 2000    lumpectomy/ Dr Crawford    Family History  Problem Relation Age of Onset  . Coronary artery disease Father   . Colon cancer Father   . Alzheimer's disease Father   . Dementia Father   . Coronary artery disease Mother   . Hypertension Mother   . Osteoporosis Mother   . Colitis Mother   . Crohn's disease Mother   .   Breast cancer      great aunts  . Coronary artery disease      Uncle (also AAA)  . Diabetes      remote family history  . Stroke Cousin   . Esophageal cancer Neg Hx   . Rectal cancer Neg Hx   . Stomach cancer Neg Hx     Social History History  Substance Use Topics  . Smoking status: Former Smoker -- 0.25 packs/day for 5 years    Types: Cigarettes    Quit date: 01/14/1993  . Smokeless tobacco: Never Used  . Alcohol Use: No     Comment: Recovered ETOH    Allergies  Allergen Reactions  . Aspirin     REACTION: aggrivates colitis  . Caffeine   . Crestor [Rosuvastatin]     REACTION: increased lfts  . Erythromycin     REACTION: GI upset  . Nsaids     REACTION: aggrivate colitis    Current Outpatient Prescriptions  Medication Sig Dispense Refill  . acetaminophen (TYLENOL ARTHRITIS PAIN) 650 MG CR tablet Take 1,300 mg by mouth 2 (two) times daily as needed.       . amLODipine (NORVASC) 10 MG tablet TAKE 1 TABLET BY MOUTH EVERY NIGHT AT BEDTIME 90 tablet 3  . Asenapine Maleate (SAPHRIS) 10 MG SUBL Place 20 mg under the tongue at bedtime.    . aspirin 81 MG tablet Take 81 mg by mouth daily.    . atorvastatin (LIPITOR) 40 MG tablet TAKE  1/2 TABLET BY MOUTH EVERY DAY 45 tablet 3  . b complex vitamins capsule Take 1 capsule by mouth daily.    . buPROPion (WELLBUTRIN XL) 150 MG 24 hr tablet Take 450 mg by mouth daily.     . chlorproMAZINE (THORAZINE) 25 MG tablet Take 25-50 mg by mouth as needed. For migraines if frova doesn't help.    . Cholecalciferol (VITAMIN D3) 2000 UNITS TABS Take 1 tablet by mouth daily.    . cloNIDine (CATAPRES) 0.1 MG tablet Take 0.05 mg by mouth 2 (two) times daily.    . co-enzyme Q-10 50 MG capsule Take 50 mg by mouth daily.    . cyclobenzaprine (FLEXERIL) 10 MG tablet TAKE 2 TABLETS BY MOUTH EVERY NIGHT AT BEDTIME 180 tablet 1  . diazepam (VALIUM) 10 MG tablet Take 2.5-10 mg by mouth as needed for anxiety.    . diphenhydrAMINE (BENADRYL) 25 MG tablet Take 25 mg by mouth at bedtime.     . fluticasone (FLONASE) 50 MCG/ACT nasal spray USE 2 SPRAYS IN EACH NOSTRIL TWICE DAILY. 16 g 11  . glucose blood (ONETOUCH VERIO) test strip Use as instructed to check blood sugar twice a day 100 each 3  . lamoTRIgine (LAMICTAL) 200 MG tablet Take 400 mg by mouth daily.    . latanoprost (XALATAN) 0.005 % ophthalmic solution Place 1 drop into both eyes At bedtime.    . levothyroxine (LEVOTHROID) 25 MCG tablet Take 1 tablet daily before breakfast with the 200 mcg 30 tablet 3  . Linaclotide (LINZESS) 145 MCG CAPS capsule Take 1 capsule (145 mcg total) by mouth daily. 30 capsule 3  . Liniments (SALONPAS PAIN RELIEF PATCH EX) Apply topically as needed.    . Multiple Vitamin (MULTIVITAMIN) tablet Take 1 tablet by mouth daily.      . naratriptan (AMERGE) 2.5 MG tablet Take 2.5 mg by mouth 2 (two) times daily as needed for migraine. Take one (1) tablet  at onset of headache; if returns   or does not resolve, may repeat after 4 hours; do not exceed five (5) mg in 24 hours.    . nitrofurantoin (MACRODANTIN) 100 MG capsule Take 100 mg by mouth as directed. With sexual activity     . Omega-3 Krill Oil 500 MG CAPS Take 500 mg by mouth.    . pantoprazole (PROTONIX) 40 MG tablet Take 1 tablet (40 mg total) by mouth 2 (two) times daily. 180 tablet 3  . Probiotic Product (PROBIOTIC DAILY PO) Take by mouth.    . Simethicone (GAS-X PO) Take 1 tablet by mouth 4 (four) times daily.    . SitaGLIPtin-MetFORMIN HCl 100-1000 MG TB24 1 at dinner daily 30 tablet 2  . spironolactone (ALDACTONE) 25 MG tablet Take 25 mg by mouth 2 (two) times daily.      . SYNTHROID 200 MCG tablet TAKE 1 TABLET BY MOUTH EVERY DAY BEFORE BREAKFAST 30 tablet 5  . traMADol (ULTRAM) 50 MG tablet TAKE 1 TABLET BY MOUTH FOUR TIMES DAILY AS NEEDED 120 tablet 3  . valACYclovir (VALTREX) 500 MG tablet Take 500 mg by mouth 2 (two) times daily.      . vitamin C (ASCORBIC ACID) 500 MG tablet Take 250 mg by mouth daily. Takes 2 a day    . VYVANSE 50 MG capsule   0  . zolpidem (AMBIEN) 10 MG tablet Take 10 mg by mouth at bedtime.     No current facility-administered medications for this visit.    Review of Systems Review of Systems  Constitutional: Negative.   Respiratory: Negative.   Cardiovascular: Negative.     Blood pressure 138/74, pulse 74, resp. rate 16, height 5' 6.5" (1.689 m), weight 232 lb (105.235 kg).  Physical Exam Physical Exam  Constitutional: She is oriented to person, place, and time. She appears well-developed and well-nourished.  Eyes: Conjunctivae are normal. No scleral icterus.  Neck: Neck supple.  Cardiovascular: Normal rate, regular rhythm and normal heart sounds.   Pulmonary/Chest: Effort normal and breath sounds normal. Right breast exhibits no inverted nipple, no mass, no nipple discharge, no skin change and no tenderness. Left breast exhibits no inverted  nipple, no mass, no nipple discharge, no skin change and no tenderness.    Abdominal: Soft. Bowel sounds are normal. There is no tenderness.  Lymphadenopathy:    She has no cervical adenopathy.    She has no axillary adenopathy.  Neurological: She is alert and oriented to person, place, and time.  Skin: Skin is warm and dry.    Data Reviewed Mammogram, ultrasound and office notes. Pt with a spiculated mass left UIQ, suspicious microcalcifications in left inferior and in UOQ. Assessment    Left breast mass, microcalcifications as above.     Plan   Feel core biopsy of left breast uiq mass is indicated first. Pt wished to wait till tomorrow so she could use a sedative  prior to procedure The breast biopsy procedure was reviewed with the patient. The potential for bleeding, infection, and pain was reviewed. At this time, the benefits outweigh the risk and the patient is amenable to proceed tomorrow.     PCP:  [facility], [place] Ref: Dr Rosenow  Camreigh Michie G 04/28/2014, 9:15 AM    

## 2014-04-29 ENCOUNTER — Ambulatory Visit: Payer: BLUE CROSS/BLUE SHIELD

## 2014-04-29 ENCOUNTER — Encounter: Payer: Self-pay | Admitting: General Surgery

## 2014-04-29 ENCOUNTER — Ambulatory Visit (INDEPENDENT_AMBULATORY_CARE_PROVIDER_SITE_OTHER): Payer: BLUE CROSS/BLUE SHIELD | Admitting: General Surgery

## 2014-04-29 VITALS — BP 132/80 | HR 72 | Resp 12 | Ht 66.0 in | Wt 232.0 lb

## 2014-04-29 DIAGNOSIS — N6320 Unspecified lump in the left breast, unspecified quadrant: Secondary | ICD-10-CM

## 2014-04-29 DIAGNOSIS — N63 Unspecified lump in breast: Secondary | ICD-10-CM

## 2014-04-29 NOTE — Patient Instructions (Signed)
Patient has been scheduled for a left breast stereotactic biopsy at [facility] for 05/09/14 at 3:00 pm. She will check-in at the [facility] at 2:30 pm. This patient is aware of date, time, and instructions. Patient verbalizes understanding.

## 2014-04-29 NOTE — Progress Notes (Signed)
Patient ID: Lindsay Fry, female   DOB: August 04, 1960, 54 y.o.   MRN: [id number]  Chief Complaint  Patient presents with  . Procedure    left breast core biopsy    HPI Lindsay Fry is a 54 y.o. female here today for a left breast core biopsy. HPI  Past Medical History  Diagnosis Date  . Rosacea   . Family history of malignant neoplasm of gastrointestinal tract   . Allergic rhinitis, cause unspecified   . Anxiety state, unspecified   . Unspecified asthma(493.90)   . Benign neoplasm of other and unspecified site of the digestive system   . Ulcerative colitis, unspecified   . Depression   . Fibromyalgia   . GERD (gastroesophageal reflux disease)   . Hiatal hernia   . Pure hypercholesterolemia   . Unspecified essential hypertension   . Unspecified hypothyroidism   . Insomnia, unspecified   . History of migraines   . Other screening mammogram   . Asymptomatic postmenopausal status (age-related) (natural)   . Unspecified vitamin D deficiency   . Edema   . Dysuria   . History of ovarian cyst   . Cyst     on Achilles tendon  . Tubular adenoma of colon 01/10/11  . Vertigo   . Subacute confusional state 11/19/2012  . ADD (attention deficit disorder) 11/19/2012  . OCD (obsessive compulsive disorder)   . Glaucoma   . History of alcoholism   . Bipolar disorder   . Diabetes mellitus     frank  . Atypical hyperplasia of left breast 2000    Past Surgical History  Procedure Laterality Date  . Wisdom tooth extraction  1980's  . Tonsillectomy  1984  . Cesarean section  1996/1997    placenta previa, gest DM, pre-eclampsia  . Nasal sinus surgery  05/1997  . Precancerous mole removed    . Cholecystectomy  1997    adhesions also  . Meckel diverticulum excision  1999  . Dexa  04/1999 and 2010    normal  . Colonoscopy  01/2001    Ulcerative colitis  . Esophagogastroduodenoscopy  09/2001    polyp  . Exercise stress test  11/2003    negative  . Nuclear stress test  06/2007     negative  . Colonoscopy  04/2004    UC, polyp  . Colonoscopy  12/08    UC, no polyps  . Sleep study  11/09    no apnea, but did snore (done by [facility])  . Bunionectomy Right   . Femur fracture surgery Left   . Appendectomy    . Breast biopsy  Aug 2000    on tamoxifen, atypical hyperplasia  . Breast surgery Left Aug 2000    lumpectomy/ Dr Sharlet Salina    Family History  Problem Relation Age of Onset  . Coronary artery disease Father   . Colon cancer Father   . Alzheimer's disease Father   . Dementia Father   . Coronary artery disease Mother   . Hypertension Mother   . Osteoporosis Mother   . Colitis Mother   . Crohn's disease Mother   . Breast cancer      great aunts  . Coronary artery disease      Uncle (also AAA)  . Diabetes      remote family history  . Stroke Cousin   . Esophageal cancer Neg Hx   . Rectal cancer Neg Hx   . Stomach cancer Neg Hx  Social History History  Substance Use Topics  . Smoking status: Former Smoker -- 0.25 packs/day for 5 years    Types: Cigarettes    Quit date: 01/14/1993  . Smokeless tobacco: Never Used  . Alcohol Use: No     Comment: Recovered ETOH    Allergies  Allergen Reactions  . Aspirin     REACTION: aggrivates colitis  . Caffeine   . Crestor [Rosuvastatin]     REACTION: increased lfts  . Erythromycin     REACTION: GI upset  . Nsaids     REACTION: aggrivate colitis    Current Outpatient Prescriptions  Medication Sig Dispense Refill  . acetaminophen (TYLENOL ARTHRITIS PAIN) 650 MG CR tablet Take 1,300 mg by mouth 2 (two) times daily as needed.     Marland Kitchen amLODipine (NORVASC) 10 MG tablet TAKE 1 TABLET BY MOUTH EVERY NIGHT AT BEDTIME 90 tablet 3  . Asenapine Maleate (SAPHRIS) 10 MG SUBL Place 20 mg under the tongue at bedtime.    Marland Kitchen aspirin 81 MG tablet Take 81 mg by mouth daily.    Marland Kitchen atorvastatin (LIPITOR) 40 MG tablet TAKE  1/2 TABLET BY MOUTH EVERY DAY 45 tablet 3  . b complex vitamins capsule Take 1 capsule by  mouth daily.    Marland Kitchen buPROPion (WELLBUTRIN XL) 150 MG 24 hr tablet Take 450 mg by mouth daily.     . chlorproMAZINE (THORAZINE) 25 MG tablet Take 25-50 mg by mouth as needed. For migraines if frova doesn't help.    . Cholecalciferol (VITAMIN D3) 2000 UNITS TABS Take 1 tablet by mouth daily.    . cloNIDine (CATAPRES) 0.1 MG tablet Take 0.05 mg by mouth 2 (two) times daily.    Marland Kitchen co-enzyme Q-10 50 MG capsule Take 50 mg by mouth daily.    . cyclobenzaprine (FLEXERIL) 10 MG tablet TAKE 2 TABLETS BY MOUTH EVERY NIGHT AT BEDTIME 180 tablet 1  . diazepam (VALIUM) 10 MG tablet Take 2.5-10 mg by mouth as needed for anxiety.    . diphenhydrAMINE (BENADRYL) 25 MG tablet Take 25 mg by mouth at bedtime.     . fluticasone (FLONASE) 50 MCG/ACT nasal spray USE 2 SPRAYS IN EACH NOSTRIL TWICE DAILY. 16 g 11  . glucose blood (ONETOUCH VERIO) test strip Use as instructed to check blood sugar twice a day 100 each 3  . lamoTRIgine (LAMICTAL) 200 MG tablet Take 400 mg by mouth daily.    Marland Kitchen latanoprost (XALATAN) 0.005 % ophthalmic solution Place 1 drop into both eyes At bedtime.    Marland Kitchen levothyroxine (LEVOTHROID) 25 MCG tablet Take 1 tablet daily before breakfast with the 200 mcg 30 tablet 3  . Linaclotide (LINZESS) 145 MCG CAPS capsule Take 1 capsule (145 mcg total) by mouth daily. 30 capsule 3  . Liniments (SALONPAS PAIN RELIEF PATCH EX) Apply topically as needed.    . Multiple Vitamin (MULTIVITAMIN) tablet Take 1 tablet by mouth daily.      . naratriptan (AMERGE) 2.5 MG tablet Take 2.5 mg by mouth 2 (two) times daily as needed for migraine. Take one (1) tablet at onset of headache; if returns or does not resolve, may repeat after 4 hours; do not exceed five (5) mg in 24 hours.    . nitrofurantoin (MACRODANTIN) 100 MG capsule Take 100 mg by mouth as directed. With sexual activity     . Omega-3 Krill Oil 500 MG CAPS Take 500 mg by mouth.    . pantoprazole (PROTONIX) 40 MG tablet Take 1  tablet (40 mg total) by mouth 2 (two)  times daily. 180 tablet 3  . Probiotic Product (PROBIOTIC DAILY PO) Take by mouth.    . Simethicone (GAS-X PO) Take 1 tablet by mouth 4 (four) times daily.    . SitaGLIPtin-MetFORMIN HCl [phone] MG TB24 1 at dinner daily 30 tablet 2  . spironolactone (ALDACTONE) 25 MG tablet Take 25 mg by mouth 2 (two) times daily.      Marland Kitchen SYNTHROID 200 MCG tablet TAKE 1 TABLET BY MOUTH EVERY DAY BEFORE BREAKFAST 30 tablet 5  . traMADol (ULTRAM) 50 MG tablet TAKE 1 TABLET BY MOUTH FOUR TIMES DAILY AS NEEDED 120 tablet 3  . valACYclovir (VALTREX) 500 MG tablet Take 500 mg by mouth 2 (two) times daily.      . vitamin C (ASCORBIC ACID) 500 MG tablet Take 250 mg by mouth daily. Takes 2 a day    . VYVANSE 50 MG capsule   0  . zolpidem (AMBIEN) 10 MG tablet Take 10 mg by mouth at bedtime.     No current facility-administered medications for this visit.    Review of Systems Review of Systems  Constitutional: Negative.   Respiratory: Negative.   Cardiovascular: Negative.     Blood pressure 132/80, pulse 72, resp. rate 12, height 5' 6"  (1.676 m), weight 232 lb (105.235 kg).  Physical Exam Physical Exam  Data Reviewed Recent mammogram and [place]  Assessment    On attempted visualisation of left breast wuth [place] no definite hypoechoic mass was seen at 11 ocl. On further review the [place] from [facility] showed a very vague finding. The mammographic finding in left UIQ is much better seen. Given multiple areas of concern, and vague  [place] finding recommended Stereo biopsy of all three spots- UIQ mass, UOQ calcifications and calcs inferior    Plan        Patient has been scheduled for a left breast stereotactic biopsy at [facility] for 05/09/14 at 3:00 pm. She will check-in at the [facility] at 2:30 pm. This patient is aware of date, time, and instructions. Patient verbalizes understanding.     SANKAR,SEEPLAPUTHUR G 04/29/2014, 12:33 PM

## 2014-05-02 ENCOUNTER — Ambulatory Visit: Payer: Self-pay | Admitting: General Surgery

## 2014-05-09 ENCOUNTER — Ambulatory Visit: Admit: 2014-05-09 | Disposition: A | Discharge status: Home / Self Care | Payer: Self-pay | Admitting: General Surgery

## 2014-05-09 DIAGNOSIS — R920 Mammographic microcalcification found on diagnostic imaging of breast: Secondary | ICD-10-CM | POA: Diagnosis not present

## 2014-05-11 ENCOUNTER — Encounter: Payer: Self-pay | Admitting: General Surgery

## 2014-05-11 ENCOUNTER — Other Ambulatory Visit: Payer: Self-pay | Admitting: Family Medicine

## 2014-05-13 ENCOUNTER — Telehealth: Payer: Self-pay | Admitting: Endocrinology

## 2014-05-13 LAB — SURGICAL PATHOLOGY

## 2014-05-13 NOTE — Telephone Encounter (Signed)
Noted, patient is aware. 

## 2014-05-13 NOTE — Telephone Encounter (Signed)
Please see below.

## 2014-05-13 NOTE — Telephone Encounter (Signed)
Excellent, continue same dose

## 2014-05-13 NOTE — Telephone Encounter (Signed)
Since start of janumet bs is average of 130 and after meals (breakfast, lunch, or dinner)  its usually between 113 and 130

## 2014-05-16 ENCOUNTER — Ambulatory Visit (INDEPENDENT_AMBULATORY_CARE_PROVIDER_SITE_OTHER): Payer: BLUE CROSS/BLUE SHIELD | Admitting: General Surgery

## 2014-05-16 ENCOUNTER — Encounter: Payer: Self-pay | Admitting: General Surgery

## 2014-05-16 ENCOUNTER — Other Ambulatory Visit: Payer: Self-pay | Admitting: General Surgery

## 2014-05-16 VITALS — BP 172/84 | HR 82 | Resp 14 | Ht 66.0 in | Wt 234.0 lb

## 2014-05-16 DIAGNOSIS — C50912 Malignant neoplasm of unspecified site of left female breast: Secondary | ICD-10-CM | POA: Diagnosis not present

## 2014-05-16 NOTE — Addendum Note (Signed)
Addended by: Christene Lye on: 05/16/2014 05:30 PM   Modules accepted: Orders

## 2014-05-16 NOTE — Progress Notes (Signed)
Patient ID: Lindsay Fry, female   DOB: [birth year], 54 y.o.   MRN: [id number]  Chief Complaint  Patient presents with  . Routine Post Op    left breast stereo     HPI Lindsay Fry is a 54 y.o. female here today for her post op left breast stereo done on 05/09/14. Patient here today for follow up post left breast stereo.Steristrip in place and aware it may come off in one week.  Minimal bruising noted.  The patient is aware that a heating pad may be used for comfort as needed.   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 ER/PR positive. HER2 negative. Patient desires bilateral total mastectomy. HPI  Past Medical History  Diagnosis Date  . Rosacea   . Family history of malignant neoplasm of gastrointestinal tract   . Allergic rhinitis, cause unspecified   . Anxiety state, unspecified   . Unspecified asthma(493.90)   . Benign neoplasm of other and unspecified site of the digestive system   . Ulcerative colitis, unspecified   . Depression   . Fibromyalgia   . GERD (gastroesophageal reflux disease)   . Hiatal hernia   . Pure hypercholesterolemia   . Unspecified essential hypertension   . Unspecified hypothyroidism   . Insomnia, unspecified   . History of migraines   . Other screening mammogram   . Asymptomatic postmenopausal status (age-related) (natural)   . Unspecified vitamin D deficiency   . Edema   . Dysuria   . History of ovarian cyst   . Cyst     on Achilles tendon  . Tubular adenoma of colon 01/10/11  . Vertigo   . Subacute confusional state 11/19/2012  . ADD (attention deficit disorder) 11/19/2012  . OCD (obsessive compulsive disorder)   . Glaucoma   . History of alcoholism   . Bipolar disorder   . Diabetes mellitus     frank  . Atypical hyperplasia of left breast 2000    Past Surgical History  Procedure Laterality Date  . Wisdom tooth extraction  1980's  .  Tonsillectomy  1984  . Cesarean section  1996/1997    placenta previa, gest DM, pre-eclampsia  . Nasal sinus surgery  05/1997  . Precancerous mole removed    . Cholecystectomy  1997    adhesions also  . Meckel diverticulum excision  1999  . Dexa  04/1999 and 2010    normal  . Colonoscopy  01/2001    Ulcerative colitis  . Esophagogastroduodenoscopy  09/2001    polyp  . Exercise stress test  11/2003    negative  . Nuclear stress test  06/2007    negative  . Colonoscopy  04/2004    UC, polyp  . Colonoscopy  12/08    UC, no polyps  . Sleep study  11/09    no apnea, but did snore (done by [facility])  . Bunionectomy Right   . Femur fracture surgery Left   . Appendectomy    . Breast biopsy  Aug 2000    on tamoxifen, atypical hyperplasia  . Breast surgery Left Aug 2000    lumpectomy/ Dr Sharlet Salina    Family History  Problem Relation Age of Onset  . Coronary artery disease Father   . Colon cancer Father   . Alzheimer's disease Father   . Dementia Father   . Coronary artery disease Mother   . Hypertension Mother   .  Osteoporosis Mother   . Colitis Mother   . Crohn's disease Mother   . Breast cancer      great aunts  . Coronary artery disease      Uncle (also AAA)  . Diabetes      remote family history  . Stroke Cousin   . Esophageal cancer Neg Hx   . Rectal cancer Neg Hx   . Stomach cancer Neg Hx     Social History History  Substance Use Topics  . Smoking status: Former Smoker -- 0.25 packs/day for 5 years    Types: Cigarettes    Quit date: 01/14/1993  . Smokeless tobacco: Never Used  . Alcohol Use: No     Comment: Recovered ETOH    Allergies  Allergen Reactions  . Aspirin     REACTION: aggrivates colitis  . Caffeine   . Crestor [Rosuvastatin]     REACTION: increased lfts  . Erythromycin     REACTION: GI upset  . Nsaids     REACTION: aggrivate colitis    Current Outpatient Prescriptions  Medication Sig Dispense Refill  . acetaminophen (TYLENOL ARTHRITIS  PAIN) 650 MG CR tablet Take 1,300 mg by mouth 2 (two) times daily as needed.     Marland Kitchen amLODipine (NORVASC) 10 MG tablet TAKE 1 TABLET BY MOUTH EVERY NIGHT AT BEDTIME 90 tablet 3  . Asenapine Maleate (SAPHRIS) 10 MG SUBL Place 20 mg under the tongue at bedtime.    Marland Kitchen aspirin 81 MG tablet Take 81 mg by mouth daily.    Marland Kitchen atorvastatin (LIPITOR) 40 MG tablet TAKE  1/2 TABLET BY MOUTH EVERY DAY 45 tablet 3  . b complex vitamins capsule Take 1 capsule by mouth daily.    Marland Kitchen buPROPion (WELLBUTRIN XL) 150 MG 24 hr tablet Take 450 mg by mouth daily.     . chlorproMAZINE (THORAZINE) 25 MG tablet Take 25-50 mg by mouth as needed. For migraines if frova doesn't help.    . Cholecalciferol (VITAMIN D3) 2000 UNITS TABS Take 1 tablet by mouth daily.    . cloNIDine (CATAPRES) 0.1 MG tablet TAKE 1 TABLET BY MOUTH TWICE DAILY 60 tablet 2  . co-enzyme Q-10 50 MG capsule Take 50 mg by mouth daily.    . cyclobenzaprine (FLEXERIL) 10 MG tablet TAKE 2 TABLETS BY MOUTH EVERY NIGHT AT BEDTIME 180 tablet 1  . diazepam (VALIUM) 10 MG tablet Take 2.5-10 mg by mouth as needed for anxiety.    . diphenhydrAMINE (BENADRYL) 25 MG tablet Take 25 mg by mouth at bedtime.     . fluticasone (FLONASE) 50 MCG/ACT nasal spray USE 2 SPRAYS IN EACH NOSTRIL TWICE DAILY. 16 g 11  . glucose blood (ONETOUCH VERIO) test strip Use as instructed to check blood sugar twice a day 100 each 3  . lamoTRIgine (LAMICTAL) 200 MG tablet Take 400 mg by mouth daily.    Marland Kitchen latanoprost (XALATAN) 0.005 % ophthalmic solution Place 1 drop into both eyes At bedtime.    Marland Kitchen levothyroxine (LEVOTHROID) 25 MCG tablet Take 1 tablet daily before breakfast with the 200 mcg 30 tablet 3  . Linaclotide (LINZESS) 145 MCG CAPS capsule Take 1 capsule (145 mcg total) by mouth daily. 30 capsule 3  . Liniments (SALONPAS PAIN RELIEF PATCH EX) Apply topically as needed.    . Multiple Vitamin (MULTIVITAMIN) tablet Take 1 tablet by mouth daily.      . naratriptan (AMERGE) 2.5 MG tablet Take  2.5 mg by mouth 2 (two) times daily  as needed for migraine. Take one (1) tablet at onset of headache; if returns or does not resolve, may repeat after 4 hours; do not exceed five (5) mg in 24 hours.    . nitrofurantoin (MACRODANTIN) 100 MG capsule Take 100 mg by mouth as directed. With sexual activity     . Omega-3 Krill Oil 500 MG CAPS Take 500 mg by mouth.    . pantoprazole (PROTONIX) 40 MG tablet Take 1 tablet (40 mg total) by mouth 2 (two) times daily. 180 tablet 3  . Probiotic Product (PROBIOTIC DAILY PO) Take by mouth.    . Simethicone (GAS-X PO) Take 1 tablet by mouth 4 (four) times daily.    . SitaGLIPtin-MetFORMIN HCl [phone] MG TB24 1 at dinner daily 30 tablet 2  . spironolactone (ALDACTONE) 25 MG tablet Take 25 mg by mouth 2 (two) times daily.      Marland Kitchen SYNTHROID 200 MCG tablet TAKE 1 TABLET BY MOUTH EVERY DAY BEFORE BREAKFAST 30 tablet 5  . traMADol (ULTRAM) 50 MG tablet TAKE 1 TABLET BY MOUTH FOUR TIMES DAILY AS NEEDED 120 tablet 3  . valACYclovir (VALTREX) 500 MG tablet Take 500 mg by mouth 2 (two) times daily.      . vitamin C (ASCORBIC ACID) 500 MG tablet Take 250 mg by mouth daily. Takes 2 a day    . VYVANSE 50 MG capsule   0  . zolpidem (AMBIEN) 10 MG tablet Take 10 mg by mouth at bedtime.     No current facility-administered medications for this visit.    Review of Systems Review of Systems  Blood pressure 172/84, pulse 82, resp. rate 14, height $RemoveBe'5\' 6"'TPiCNGGsq$  (1.676 m), weight 234 lb (106.142 kg).  Physical Exam Physical Exam Bx sites look clean.   Data Reviewed Pathology invasive cancer in [place], DCIS UOQ. Atypical hyperplasia in the inferior breast. The invasive cancer was ER/PR positive and HER2 negative.   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    Plan   Treatment discussed in full. Pt would prefer to have bilateral mastectomies. Also would like subsequent reconstruction. Bilateral total mastectomy with SN biopsy  left axillary. Procedure  discussed in detail with patient to include risks, benefits, drain care, and future reconstruction. Patient questions answered. Patient is agreeable. Also discussed follow up treatment after mastectomy to include hormonal therapy-remote need for chemo   Gaspar Cola 05/16/2014, 2:03 PM

## 2014-05-17 ENCOUNTER — Encounter: Payer: Self-pay | Admitting: General Surgery

## 2014-05-19 ENCOUNTER — Encounter
Admission: RE | Admit: 2014-05-19 | Discharge: 2014-05-19 | Disposition: A | Discharge status: Home / Self Care | Payer: BLUE CROSS/BLUE SHIELD | Source: Ambulatory Visit | Attending: General Surgery | Admitting: General Surgery

## 2014-05-19 ENCOUNTER — Telehealth: Payer: Self-pay | Admitting: *Deleted

## 2014-05-19 DIAGNOSIS — Z01812 Encounter for preprocedural laboratory examination: Secondary | ICD-10-CM | POA: Insufficient documentation

## 2014-05-19 HISTORY — DX: Headache, unspecified: R51.9

## 2014-05-19 HISTORY — DX: Headache: R51

## 2014-05-19 HISTORY — DX: Unspecified osteoarthritis, unspecified site: M19.90

## 2014-05-19 LAB — CBC WITH DIFFERENTIAL/PLATELET
Basophils Absolute: 0 10*3/uL (ref 0–0.1)
Basophils Relative: 0 %
Eosinophils Absolute: 0.3 10*3/uL (ref 0–0.7)
Eosinophils Relative: 3 %
HCT: 45.6 % (ref 35.0–47.0)
Hemoglobin: 15 g/dL (ref 12.0–16.0)
Lymphocytes Relative: 17 %
Lymphs Abs: 1.9 10*3/uL (ref 1.0–3.6)
MCH: 29.6 pg (ref 26.0–34.0)
MCHC: 32.9 g/dL (ref 32.0–36.0)
MCV: 89.8 fL (ref 80.0–100.0)
Monocytes Absolute: 1 10*3/uL — ABNORMAL HIGH (ref 0.2–0.9)
Monocytes Relative: 9 %
Neutro Abs: 8.1 10*3/uL — ABNORMAL HIGH (ref 1.4–6.5)
Neutrophils Relative %: 71 %
Platelets: 435 10*3/uL (ref 150–440)
RBC: 5.08 MIL/uL (ref 3.80–5.20)
RDW: 13.8 % (ref 11.5–14.5)
WBC: 11.4 10*3/uL — ABNORMAL HIGH (ref 3.6–11.0)

## 2014-05-19 LAB — BASIC METABOLIC PANEL
Anion gap: 9 (ref 5–15)
BUN: 12 mg/dL (ref 6–20)
CO2: 26 mmol/L (ref 22–32)
Calcium: 9.6 mg/dL (ref 8.9–10.3)
Chloride: 104 mmol/L (ref 101–111)
Creatinine, Ser: 0.8 mg/dL (ref 0.44–1.00)
GFR calc Af Amer: >60 mL/min (ref >60)
GFR calc non Af Amer: >60 mL/min (ref >60)
Glucose, Bld: 135 mg/dL — ABNORMAL HIGH (ref 65–99)
Potassium: 3.8 mmol/L (ref 3.5–5.1)
Sodium: 139 mmol/L (ref 135–145)

## 2014-05-19 NOTE — Telephone Encounter (Signed)
-----   Message from Christene Lye, MD sent at 05/19/2014  2:41 PM EDT ----- Inform pt labs are normal. F/u as scheduled

## 2014-05-19 NOTE — Telephone Encounter (Signed)
Notified patient as instructed, patient pleased. Discussed follow-up appointments, patient agrees  

## 2014-05-19 NOTE — Progress Notes (Signed)
Quick Note:  Inform pt labs are normal. F/u as scheduled ______ 

## 2014-05-20 LAB — CANCER ANTIGEN 27.29: CA 27.29: 38.9 U/mL — ABNORMAL HIGH (ref 0.0–38.6)

## 2014-05-23 ENCOUNTER — Encounter: Payer: Self-pay | Admitting: Anesthesiology

## 2014-05-23 ENCOUNTER — Ambulatory Visit
Admission: RE | Admit: 2014-05-23 | Discharge: 2014-05-23 | Disposition: A | Discharge status: Home / Self Care | Payer: BLUE CROSS/BLUE SHIELD | Source: Ambulatory Visit | Attending: General Surgery | Admitting: General Surgery

## 2014-05-23 ENCOUNTER — Ambulatory Visit: Payer: BLUE CROSS/BLUE SHIELD | Admitting: Anesthesiology

## 2014-05-23 ENCOUNTER — Other Ambulatory Visit: Payer: BLUE CROSS/BLUE SHIELD

## 2014-05-23 ENCOUNTER — Encounter
Admission: RE | Disposition: A | Discharge status: Home / Self Care | Payer: Self-pay | Source: Ambulatory Visit | Attending: General Surgery

## 2014-05-23 ENCOUNTER — Encounter: Payer: Self-pay | Admitting: Oncology

## 2014-05-23 DIAGNOSIS — C50912 Malignant neoplasm of unspecified site of left female breast: Secondary | ICD-10-CM | POA: Diagnosis not present

## 2014-05-23 DIAGNOSIS — E559 Vitamin D deficiency, unspecified: Secondary | ICD-10-CM | POA: Insufficient documentation

## 2014-05-23 DIAGNOSIS — K219 Gastro-esophageal reflux disease without esophagitis: Secondary | ICD-10-CM | POA: Insufficient documentation

## 2014-05-23 DIAGNOSIS — N62 Hypertrophy of breast: Secondary | ICD-10-CM | POA: Insufficient documentation

## 2014-05-23 DIAGNOSIS — J45909 Unspecified asthma, uncomplicated: Secondary | ICD-10-CM | POA: Diagnosis not present

## 2014-05-23 DIAGNOSIS — F319 Bipolar disorder, unspecified: Secondary | ICD-10-CM | POA: Insufficient documentation

## 2014-05-23 DIAGNOSIS — N63 Unspecified lump in breast: Secondary | ICD-10-CM | POA: Diagnosis present

## 2014-05-23 DIAGNOSIS — D241 Benign neoplasm of right breast: Secondary | ICD-10-CM | POA: Diagnosis not present

## 2014-05-23 DIAGNOSIS — Z888 Allergy status to other drugs, medicaments and biological substances status: Secondary | ICD-10-CM | POA: Insufficient documentation

## 2014-05-23 DIAGNOSIS — E119 Type 2 diabetes mellitus without complications: Secondary | ICD-10-CM | POA: Diagnosis not present

## 2014-05-23 DIAGNOSIS — N6011 Diffuse cystic mastopathy of right breast: Secondary | ICD-10-CM | POA: Insufficient documentation

## 2014-05-23 DIAGNOSIS — Z881 Allergy status to other antibiotic agents status: Secondary | ICD-10-CM | POA: Insufficient documentation

## 2014-05-23 DIAGNOSIS — F329 Major depressive disorder, single episode, unspecified: Secondary | ICD-10-CM | POA: Insufficient documentation

## 2014-05-23 DIAGNOSIS — M797 Fibromyalgia: Secondary | ICD-10-CM | POA: Diagnosis not present

## 2014-05-23 DIAGNOSIS — K449 Diaphragmatic hernia without obstruction or gangrene: Secondary | ICD-10-CM | POA: Insufficient documentation

## 2014-05-23 DIAGNOSIS — I10 Essential (primary) hypertension: Secondary | ICD-10-CM | POA: Diagnosis not present

## 2014-05-23 DIAGNOSIS — Z7982 Long term (current) use of aspirin: Secondary | ICD-10-CM | POA: Diagnosis not present

## 2014-05-23 DIAGNOSIS — Z79899 Other long term (current) drug therapy: Secondary | ICD-10-CM | POA: Insufficient documentation

## 2014-05-23 DIAGNOSIS — D0512 Intraductal carcinoma in situ of left breast: Secondary | ICD-10-CM | POA: Diagnosis not present

## 2014-05-23 DIAGNOSIS — E039 Hypothyroidism, unspecified: Secondary | ICD-10-CM | POA: Insufficient documentation

## 2014-05-23 DIAGNOSIS — R920 Mammographic microcalcification found on diagnostic imaging of breast: Secondary | ICD-10-CM | POA: Insufficient documentation

## 2014-05-23 DIAGNOSIS — Z886 Allergy status to analgesic agent status: Secondary | ICD-10-CM | POA: Insufficient documentation

## 2014-05-23 DIAGNOSIS — Z87891 Personal history of nicotine dependence: Secondary | ICD-10-CM | POA: Diagnosis not present

## 2014-05-23 DIAGNOSIS — E780 Pure hypercholesterolemia: Secondary | ICD-10-CM | POA: Insufficient documentation

## 2014-05-23 HISTORY — PX: AXILLARY SENTINEL NODE BIOPSY: SHX5738

## 2014-05-23 HISTORY — PX: SIMPLE MASTECTOMY WITH AXILLARY SENTINEL NODE BIOPSY: SHX6098

## 2014-05-23 HISTORY — PX: BREAST SURGERY: SHX581

## 2014-05-23 LAB — GLUCOSE, CAPILLARY
Glucose-Capillary: 125 mg/dL — ABNORMAL HIGH (ref 70–99)
Glucose-Capillary: 170 mg/dL — ABNORMAL HIGH (ref 70–99)

## 2014-05-23 SURGERY — SIMPLE MASTECTOMY
Anesthesia: General | Laterality: Left | Wound class: Clean

## 2014-05-23 MED ORDER — STERILE WATER FOR IRRIGATION IR SOLN
Status: DC | PRN
  Administered 2014-05-23: 500 mL

## 2014-05-23 MED ORDER — CEFAZOLIN SODIUM-DEXTROSE 2-3 GM-% IV SOLR
2.0000 g | INTRAVENOUS | Status: AC
  Administered 2014-05-23: 2 g via INTRAVENOUS

## 2014-05-23 MED ORDER — ONDANSETRON HCL 4 MG/2ML IJ SOLN
4.0000 mg | Freq: Once | INTRAMUSCULAR | Status: DC | PRN
Start: 2014-05-23 — End: 2014-05-23

## 2014-05-23 MED ORDER — FENTANYL CITRATE (PF) 100 MCG/2ML IJ SOLN
INTRAMUSCULAR | Status: AC
  Filled 2014-05-23: qty 2

## 2014-05-23 MED ORDER — NEOSTIGMINE METHYLSULFATE 10 MG/10ML IV SOLN: INTRAVENOUS | Status: DC | PRN

## 2014-05-23 MED ORDER — LACTATED RINGERS IV SOLN
INTRAVENOUS | Status: DC | PRN
  Administered 2014-05-23: 09:00:00 via INTRAVENOUS

## 2014-05-23 MED ORDER — TECHNETIUM TC 99M SULFUR COLLOID
0.9780 | Freq: Once | INTRAVENOUS | Status: AC | PRN
  Administered 2014-05-23: 0.978 via INTRAVENOUS

## 2014-05-23 MED ORDER — FENTANYL CITRATE (PF) 100 MCG/2ML IJ SOLN
INTRAMUSCULAR | Status: DC | PRN
  Administered 2014-05-23 (×5): 50 ug via INTRAVENOUS

## 2014-05-23 MED ORDER — GLYCOPYRROLATE 0.2 MG/ML IJ SOLN
INTRAMUSCULAR | Status: DC | PRN
  Administered 2014-05-23: .8 mg via INTRAVENOUS

## 2014-05-23 MED ORDER — SODIUM CHLORIDE 0.9 % IV SOLN
INTRAVENOUS | Status: DC
  Administered 2014-05-23: 09:00:00 via INTRAVENOUS

## 2014-05-23 MED ORDER — SUCCINYLCHOLINE CHLORIDE 20 MG/ML IJ SOLN
INTRAMUSCULAR | Status: DC | PRN
  Administered 2014-05-23: 100 mg via INTRAVENOUS

## 2014-05-23 MED ORDER — MIDAZOLAM HCL 2 MG/2ML IJ SOLN
INTRAMUSCULAR | Status: DC | PRN
  Administered 2014-05-23: 2 mg via INTRAVENOUS

## 2014-05-23 MED ORDER — DEXMEDETOMIDINE HCL IN NACL 200 MCG/50ML IV SOLN
INTRAVENOUS | Status: DC | PRN
  Administered 2014-05-23: 16 ug via INTRAVENOUS

## 2014-05-23 MED ORDER — METHYLENE BLUE 1 % INJ SOLN
INTRAMUSCULAR | Status: AC
  Filled 2014-05-23: qty 10

## 2014-05-23 MED ORDER — SODIUM CHLORIDE 0.9 % IJ SOLN
INTRAMUSCULAR | Status: DC | PRN
  Administered 2014-05-23: 4 mL

## 2014-05-23 MED ORDER — ACETAMINOPHEN 10 MG/ML IV SOLN
INTRAVENOUS | Status: AC
  Administered 2014-05-23: 1000 mg via INTRAVENOUS
  Filled 2014-05-23: qty 100

## 2014-05-23 MED ORDER — SODIUM CHLORIDE 0.9 % IJ SOLN
INTRAMUSCULAR | Status: AC
  Filled 2014-05-23: qty 10

## 2014-05-23 MED ORDER — METHYLENE BLUE 1 % INJ SOLN
INTRAMUSCULAR | Status: DC | PRN
  Administered 2014-05-23: 2 mL

## 2014-05-23 MED ORDER — LIDOCAINE HCL (CARDIAC) 20 MG/ML IV SOLN
INTRAVENOUS | Status: DC | PRN
  Administered 2014-05-23: 100 mg via INTRAVENOUS

## 2014-05-23 MED ORDER — CEFAZOLIN SODIUM-DEXTROSE 2-3 GM-% IV SOLR
INTRAVENOUS | Status: AC
  Administered 2014-05-23: 1 g via INTRAVENOUS
  Filled 2014-05-23: qty 50

## 2014-05-23 MED ORDER — ONDANSETRON HCL 4 MG/2ML IJ SOLN
INTRAMUSCULAR | Status: DC | PRN
  Administered 2014-05-23: 4 mg via INTRAVENOUS

## 2014-05-23 MED ORDER — FENTANYL CITRATE (PF) 100 MCG/2ML IJ SOLN
25.0000 ug | INTRAMUSCULAR | Status: AC | PRN
  Administered 2014-05-23 (×4): 25 ug via INTRAVENOUS

## 2014-05-23 MED ORDER — PROPOFOL 10 MG/ML IV BOLUS
INTRAVENOUS | Status: DC | PRN
  Administered 2014-05-23: 160 mg via INTRAVENOUS

## 2014-05-23 MED ORDER — ROCURONIUM BROMIDE 100 MG/10ML IV SOLN
INTRAVENOUS | Status: DC | PRN
  Administered 2014-05-23 (×4): 10 mg via INTRAVENOUS
  Administered 2014-05-23: 20 mg via INTRAVENOUS

## 2014-05-23 MED ORDER — DEXAMETHASONE SODIUM PHOSPHATE 4 MG/ML IJ SOLN
INTRAMUSCULAR | Status: DC | PRN
  Administered 2014-05-23: 5 mg via INTRAVENOUS

## 2014-05-23 MED ORDER — OXYCODONE HCL 5 MG PO TABS: 5.0000 mg | ORAL_TABLET | ORAL | Status: DC | PRN

## 2014-05-23 MED ORDER — NEOSTIGMINE METHYLSULFATE 10 MG/10ML IV SOLN
INTRAVENOUS | Status: DC | PRN
  Administered 2014-05-23: 5 mg via INTRAMUSCULAR

## 2014-05-23 SURGICAL SUPPLY — 78 items
APPLIER CLIP 11 MED OPEN (CLIP)
APPLIER CLIP 13 LRG OPEN (CLIP)
BASIN GRAD PLASTIC 32OZ STRL (MISCELLANEOUS) ×3 IMPLANT
BLADE SURG 15 STRL SS SAFETY (BLADE) IMPLANT
BULB RESERV EVAC DRAIN JP 100C (MISCELLANEOUS) ×6 IMPLANT
CANISTER SUCT 1200ML W/VALVE (MISCELLANEOUS) ×3 IMPLANT
CHLORAPREP W/TINT 26ML (MISCELLANEOUS) ×3 IMPLANT
CLIP APPLIE 11 MED OPEN (CLIP) IMPLANT
CLIP APPLIE 13 LRG OPEN (CLIP) IMPLANT
CNTNR SPEC 2.5X3XGRAD LEK (MISCELLANEOUS) ×4
CONT SPEC 4OZ STER OR WHT (MISCELLANEOUS) ×2
CONTAINER SPEC 2.5X3XGRAD LEK (MISCELLANEOUS) ×4 IMPLANT
COUNTER NEEDLE 20/40 LG (NEEDLE) ×3 IMPLANT
COVER LIGHT HANDLE STERIS (MISCELLANEOUS) ×3 IMPLANT
COVER MAYO STAND STRL (DRAPES) ×3 IMPLANT
DERMABOND ADVANCED (GAUZE/BANDAGES/DRESSINGS) ×1
DERMABOND ADVANCED .7 DNX12 (GAUZE/BANDAGES/DRESSINGS) ×2 IMPLANT
DEVICE DISSECT PLASMABLAD 3.0S (MISCELLANEOUS) ×2 IMPLANT
DRAIN CHANNEL JP 15F RND 16 (MISCELLANEOUS) ×6 IMPLANT
DRAPE LAPAROTOMY TRNSV 106X77 (MISCELLANEOUS) ×3 IMPLANT
DRSG TEGADERM 2-3/8X2-3/4 SM (GAUZE/BANDAGES/DRESSINGS) ×6 IMPLANT
DRSG TELFA 3X8 NADH (GAUZE/BANDAGES/DRESSINGS) IMPLANT
ELECT CAUTERY BLADE 6.4 (BLADE) ×3 IMPLANT
GAUZE FLUFF 18X24 1PLY STRL (GAUZE/BANDAGES/DRESSINGS) ×6 IMPLANT
GAUZE SPONGE 4X4 12PLY STRL (GAUZE/BANDAGES/DRESSINGS) ×3 IMPLANT
GLOVE BIO SURGEON STRL SZ7 (GLOVE) ×24 IMPLANT
GLOVE INDICATOR 7.5 STRL GRN (GLOVE) ×9 IMPLANT
GOWN STRL REUS W/ TWL LRG LVL3 (GOWN DISPOSABLE) ×10 IMPLANT
GOWN STRL REUS W/TWL LRG LVL3 (GOWN DISPOSABLE) ×5
HANDLE YANKAUER SUCT BULB TIP (MISCELLANEOUS) ×3 IMPLANT
HARMONIC SCALPEL FOCUS (MISCELLANEOUS) IMPLANT
KIT RM TURNOVER STRD PROC AR (KITS) ×3 IMPLANT
LABEL OR SOLS (LABEL) ×3 IMPLANT
LIQUID BAND (GAUZE/BANDAGES/DRESSINGS) ×6 IMPLANT
MARGIN MAP 10MM (MISCELLANEOUS) IMPLANT
MARKER SKIN W/RULER 31145785 (MISCELLANEOUS) ×3 IMPLANT
NDL HPO THNWL 1X22GA REG BVL (NEEDLE) IMPLANT
NDL SAFETY 25GX1.5 (NEEDLE) IMPLANT
NDL SAFETY ECLIPSE 18X1.5 (NEEDLE) ×2 IMPLANT
NEEDLE HYPO 18GX1.5 SHARP (NEEDLE) ×1
NEEDLE HYPO 25X1 1.5 SAFETY (NEEDLE) ×3 IMPLANT
NEEDLE SAFETY 22GX1 (NEEDLE)
PACK BASIN MINOR ARMC (MISCELLANEOUS) ×3 IMPLANT
PAD ABD DERMACEA PRESS 5X9 (GAUZE/BANDAGES/DRESSINGS) IMPLANT
PAD GROUND ADULT SPLIT (MISCELLANEOUS) ×3 IMPLANT
PIN SAFETY STRL (MISCELLANEOUS) ×3 IMPLANT
PLASMABLADE 3.0S (MISCELLANEOUS) ×3
SLEVE PROBE SENORX GAMMA FIND (MISCELLANEOUS) ×3 IMPLANT
SPONGE LAP 18X18 5 PK (GAUZE/BANDAGES/DRESSINGS) ×6 IMPLANT
SPONGE XRAY 4X4 16PLY STRL (MISCELLANEOUS) ×3 IMPLANT
STRIP CLOSURE SKIN 1/2X4 (GAUZE/BANDAGES/DRESSINGS) IMPLANT
SURGI-BRA LG (MISCELLANEOUS) IMPLANT
SURGI-BRA XL (MISCELLANEOUS) ×3 IMPLANT
SUT ETHILON 3-0 FS-10 30 BLK (SUTURE) ×6
SUT MNCRL AB 3-0 PS2 27 (SUTURE) ×12 IMPLANT
SUT SILK 2 0 (SUTURE)
SUT SILK 2-0 18XBRD TIE 12 (SUTURE) IMPLANT
SUT SILK 3 0 (SUTURE)
SUT SILK 3-0 18XBRD TIE 12 (SUTURE) IMPLANT
SUT SILK 4 0 (SUTURE)
SUT SILK 4-0 18XBRD TIE 12 (SUTURE) IMPLANT
SUT VIC AB 2-0 CT1 (SUTURE) ×12 IMPLANT
SUT VIC AB 2-0 CT1 27 (SUTURE)
SUT VIC AB 2-0 CT1 TAPERPNT 27 (SUTURE) IMPLANT
SUT VIC AB 2-0 SH 27 (SUTURE) ×2
SUT VIC AB 2-0 SH 27XBRD (SUTURE) ×4 IMPLANT
SUT VIC AB 3-0 54X BRD REEL (SUTURE) ×2 IMPLANT
SUT VIC AB 3-0 BRD 54 (SUTURE) ×1
SUT VIC AB 3-0 SH 27 (SUTURE)
SUT VIC AB 3-0 SH 27X BRD (SUTURE) IMPLANT
SUT VICRYL+ 3-0 144IN (SUTURE) IMPLANT
SUTURE EHLN 3-0 FS-10 30 BLK (SUTURE) ×4 IMPLANT
SWABSTK COMLB BENZOIN TINCTURE (MISCELLANEOUS) IMPLANT
SYR CONTROL 10ML (SYRINGE) ×3 IMPLANT
TAPE TRANSPORE STRL 2 31045 (GAUZE/BANDAGES/DRESSINGS) ×6 IMPLANT
TOWEL OR 17X26 4PK STRL BLUE (TOWEL DISPOSABLE) ×6 IMPLANT
TUBING CONNECTING 10 (TUBING) ×3 IMPLANT
WATER STERILE IRR 1000ML POUR (IV SOLUTION) ×6 IMPLANT

## 2014-05-23 NOTE — Op Note (Signed)
Preoperative diagnosis: Carcinoma left breast multiple areas  Postop diagnosis: Same  Operation performed: Left total mastectomy with sentinel node biopsy and prophylactic right total mastectomy  Anesthesia: Gen.  Complications: None  EBL: 200 mL  Drains: 2 JP drains one on each side  Patient was put to sleep with an endotracheal tube and after this was satisfactory skewered and both breasts and axillary regions were prepped and draped sterile field prior to prepping the 5 mL of 6 mL of methylene blue diluted 50% was instilled in the subareolar area for sentinel node identification. Timeout was performed. Left mastectomy and node biopsy was performed first. Elliptical incisions were mapped out for both breast to be symmetric and the incision was made along the superior flap area in the lateral aspect of the left breast. A plasma knife was used for the entire dissection. The superior flap was extended all the way over towards the axillary region and the axillary fat pad area was inspected remarkably there was extremely local note extremely minimal to no signal activity of the Gamma finder and all was today any blue nodes identified. This point the rest of the skin incision was completed this. The flaps were then taken all the way to the infraclavicular and upper rectus fascia level respectively. Hemostasis was obtained primarily with a plasma knife. The breast gland along with the pectoral fascia was dissected off the pectoralis major muscle medial to lateral aspect and the lateral attachments are reached the axillary tail in the axillary contents were carefully inspected. The patient did have 2 nodes adjoining each other 1 with blue dye and the other without very minimal signal activity was noted at the site. These 2 nodes were removed and sent off as sentinel node in a non-sentinel node with frozen section showing no macro metastases. The breast lateral attachments were then taken down and the lateral  end was tagged for pathologic identification the left breast was sent to pathology for histologic evaluation.  The ensuring hemostasis the wound was irrigated and drained with a Jackson-Pratt drain brought out through the lower flap. The drain was passed through the skin with a nylon stitch. The wound was closed in 2 layers with 2-0 Vicryl in the subcutaneous tissue. Skin closed with 2 subcuticular 3-0 Monocryl stitches.  A right total mastectomy was then performed. Incision made along the previously marked lines. Superior and inferior flaps were then created in the infraclavicular and upper rectus fascia respectively. Again the breast tissue along the pectoral fascia was dissected off from medial to lateral aspects and removed. The bleeders were carefully searched for and cauterized. Wound was irrigated and closed with 2-0 Vicryl and 3-0 Monocryl similar to the left side. Jackson-Pratt drain was also utilized in the left. The left breast was also sent to pathology for histologic evaluation.  Patient tolerated the procedure well with no immediate complex blood problems identified and she was subsequently returned to PACU in stable condition

## 2014-05-23 NOTE — Anesthesia Procedure Notes (Signed)
Procedure Name: Intubation Date/Time: 05/23/2014 9:34 AM Performed by: Justus [facility] Pre-anesthesia Checklist: Patient identified, Emergency Drugs available, Suction available and Patient being monitored Patient Re-evaluated:Patient Re-evaluated prior to inductionOxygen Delivery Method: Circle system utilized Preoxygenation: Pre-oxygenation with 100% oxygen Intubation Type: IV induction and Rapid sequence Laryngoscope Size: Mac and 3 Grade View: Grade I Tube type: Oral Tube size: 7.0 mm Number of attempts: 1 Placement Confirmation: ETT inserted through vocal cords under direct vision Secured at: 21 cm Tube secured with: Tape

## 2014-05-23 NOTE — Anesthesia Postprocedure Evaluation (Signed)
  Anesthesia Post-op Note  Patient: Lindsay Fry  Procedure(s) Performed: Procedure(s): Bilateral simple mastectomy, left sentinel node biopsy  (Bilateral) AXILLARY SENTINEL NODE BIOPSY (Left)  Anesthesia type:General  Patient location: PACU  Post pain: Pain level controlled  Post assessment: Post-op Vital signs reviewed, Patient's Cardiovascular Status Stable, Respiratory Function Stable, Patent Airway and No signs of Nausea or vomiting  Post vital signs: Reviewed and stable  Last Vitals:  Filed Vitals:   05/23/14 1310  BP: 157/91  Pulse: 110  Temp:   Resp: 13    Level of consciousness: awake, alert  and patient cooperative  Complications: No apparent anesthesia complications

## 2014-05-23 NOTE — Interval H&P Note (Signed)
History and Physical Interval Note:  05/23/2014 8:38 AM  Lindsay Fry  has presented today for surgery, with the diagnosis of CA LEFT BREAST  The various methods of treatment have been discussed with the patient and family. After consideration of risks, benefits and other options for treatment, the patient has consented to  Procedure(s): SIMPLE MASTECTOMY (Bilateral) AXILLARY SENTINEL NODE BIOPSY (Left) as a surgical intervention .  The patient's history has been reviewed, patient examined, no change in status, stable for surgery.  I have reviewed the patient's chart and labs.  Questions were answered to the patient's satisfaction.     Lindsay Fry

## 2014-05-23 NOTE — Anesthesia Preprocedure Evaluation (Signed)
Anesthesia Evaluation  Patient identified by MRN, date of birth, ID band Patient awake    Reviewed: Allergy & Precautions, NPO status , Patient's Chart, lab work & pertinent test results  Airway Mallampati: III  TM Distance: >3 FB Neck ROM: Full    Dental  (+) Chipped   Pulmonary shortness of breath and with exertion, asthma , former smoker,  breath sounds clear to auscultation        Cardiovascular hypertension, Pt. on medications Rhythm:Regular Rate:Normal     Neuro/Psych  Headaches, Anxiety Depression Bipolar Disorder  Neuromuscular disease    GI/Hepatic hiatal hernia, PUD, GERD-  Medicated,  Endo/Other  diabetes, Well Controlled, Type 2Hypothyroidism   Renal/GU      Musculoskeletal  (+) Arthritis -, Osteoarthritis,  Fibromyalgia -  Abdominal Normal abdominal exam  (+) + obese,   Peds negative pediatric ROS (+)  Hematology   Anesthesia Other Findings   Reproductive/Obstetrics                             Anesthesia Physical Anesthesia Plan  ASA: III  Anesthesia Plan: General   Post-op Pain Management:    Induction: Intravenous, Rapid sequence and Cricoid pressure planned  Airway Management Planned: Oral ETT  Additional Equipment:   Intra-op Plan:   Post-operative Plan: Extubation in OR  Informed Consent: I have reviewed the patients History and Physical, chart, labs and discussed the procedure including the risks, benefits and alternatives for the proposed anesthesia with the patient or authorized representative who has indicated his/her understanding and acceptance.   Dental advisory given  Plan Discussed with:   Anesthesia Plan Comments:         Anesthesia Quick Evaluation

## 2014-05-23 NOTE — Transfer of Care (Signed)
Immediate Anesthesia Transfer of Care Note  Patient: TANZIE ROTHSCHILD  Procedure(s) Performed: Procedure(s): Bilateral simple mastectomy, left sentinel node biopsy  (Bilateral) AXILLARY SENTINEL NODE BIOPSY (Left)  Patient Location: PACU  Anesthesia Type:General  Level of Consciousness: sedated  Airway & Oxygen Therapy: Patient connected to face mask oxygen  Post-op Assessment: Report given to RN and Post -op Vital signs reviewed and stable  Post vital signs: Reviewed  Last Vitals:  Filed Vitals:   05/23/14 0901  BP: 145/96  Pulse: 98  Temp: 36.8 C  Resp: 14    Complications: No apparent anesthesia complications

## 2014-05-23 NOTE — H&P (View-Only) (Signed)
Patient ID: [name], female   DOB: 10-14-1960, 53 y.o.   MRN: [id number]  Chief Complaint  Patient presents with  . Breast Problem    adnormal mammogram    HPI Lindsay Fry is a 54 y.o. female.  who presents for a breast evaluation. The most recent mammogram was done on 04-26-14. She had an ultrasound as well. Patient does perform regular self breast checks and gets regular mammograms done.  She could not feel anything in the breast prior to mammogram. 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 She is here today with her husband, Lindsay Fry.  HPI  Past Medical History  Diagnosis Date  . Rosacea   . Family history of malignant neoplasm of gastrointestinal tract   . Allergic rhinitis, cause unspecified   . Anxiety state, unspecified   . Unspecified asthma(493.90)   . Benign neoplasm of other and unspecified site of the digestive system   . Ulcerative colitis, unspecified   . Depression   . Fibromyalgia   . GERD (gastroesophageal reflux disease)   . Hiatal hernia   . Pure hypercholesterolemia   . Unspecified essential hypertension   . Unspecified hypothyroidism   . Insomnia, unspecified   . History of migraines   . Other screening mammogram   . Asymptomatic postmenopausal status (age-related) (natural)   . Unspecified vitamin D deficiency   . Edema   . Dysuria   . History of ovarian cyst   . Cyst     on Achilles tendon  . Tubular adenoma of colon 01/10/11  . Vertigo   . Subacute confusional state 11/19/2012  . ADD (attention deficit disorder) 11/19/2012  . OCD (obsessive compulsive disorder)   . Glaucoma   . History of alcoholism   . Bipolar disorder   . Diabetes mellitus     frank  . Atypical hyperplasia of left breast 2000    Past Surgical History  Procedure Laterality Date  . Wisdom tooth extraction  1980's  . Tonsillectomy  1984  . Cesarean section  1996/1997    placenta previa, gest DM,  pre-eclampsia  . Nasal sinus surgery  05/1997  . Precancerous mole removed    . Cholecystectomy  1997    adhesions also  . Meckel diverticulum excision  1999  . Dexa  04/1999 and 2010    normal  . Colonoscopy  01/2001    Ulcerative colitis  . Esophagogastroduodenoscopy  09/2001    polyp  . Exercise stress test  11/2003    negative  . Nuclear stress test  06/2007    negative  . Colonoscopy  04/2004    UC, polyp  . Colonoscopy  12/08    UC, no polyps  . Sleep study  11/09    no apnea, but did snore (done by [facility])  . Bunionectomy Right   . Femur fracture surgery Left   . Appendectomy    . Breast biopsy  Aug 2000    on tamoxifen, atypical hyperplasia  . Breast surgery Left Aug 2000    lumpectomy/ Dr Lindsay Fry    Family History  Problem Relation Age of Onset  . Coronary artery disease Father   . Colon cancer Father   . Alzheimer's disease Father   . Dementia Father   . Coronary artery disease Mother   . Hypertension Mother   . Osteoporosis Mother   . Colitis Mother   . Crohn's disease Mother   .  Breast cancer      great aunts  . Coronary artery disease      Uncle (also AAA)  . Diabetes      remote family history  . Stroke Cousin   . Esophageal cancer Neg Hx   . Rectal cancer Neg Hx   . Stomach cancer Neg Hx     Social History History  Substance Use Topics  . Smoking status: Former Smoker -- 0.25 packs/day for 5 years    Types: Cigarettes    Quit date: 01/14/1993  . Smokeless tobacco: Never Used  . Alcohol Use: No     Comment: Recovered ETOH    Allergies  Allergen Reactions  . Aspirin     REACTION: aggrivates colitis  . Caffeine   . Crestor [Rosuvastatin]     REACTION: increased lfts  . Erythromycin     REACTION: GI upset  . Nsaids     REACTION: aggrivate colitis    Current Outpatient Prescriptions  Medication Sig Dispense Refill  . acetaminophen (TYLENOL ARTHRITIS PAIN) 650 MG CR tablet Take 1,300 mg by mouth 2 (two) times daily as needed.       Marland Kitchen amLODipine (NORVASC) 10 MG tablet TAKE 1 TABLET BY MOUTH EVERY NIGHT AT BEDTIME 90 tablet 3  . Asenapine Maleate (SAPHRIS) 10 MG SUBL Place 20 mg under the tongue at bedtime.    Marland Kitchen aspirin 81 MG tablet Take 81 mg by mouth daily.    Marland Kitchen atorvastatin (LIPITOR) 40 MG tablet TAKE  1/2 TABLET BY MOUTH EVERY DAY 45 tablet 3  . b complex vitamins capsule Take 1 capsule by mouth daily.    Marland Kitchen buPROPion (WELLBUTRIN XL) 150 MG 24 hr tablet Take 450 mg by mouth daily.     . chlorproMAZINE (THORAZINE) 25 MG tablet Take 25-50 mg by mouth as needed. For migraines if frova doesn't help.    . Cholecalciferol (VITAMIN D3) 2000 UNITS TABS Take 1 tablet by mouth daily.    . cloNIDine (CATAPRES) 0.1 MG tablet Take 0.05 mg by mouth 2 (two) times daily.    Marland Kitchen co-enzyme Q-10 50 MG capsule Take 50 mg by mouth daily.    . cyclobenzaprine (FLEXERIL) 10 MG tablet TAKE 2 TABLETS BY MOUTH EVERY NIGHT AT BEDTIME 180 tablet 1  . diazepam (VALIUM) 10 MG tablet Take 2.5-10 mg by mouth as needed for anxiety.    . diphenhydrAMINE (BENADRYL) 25 MG tablet Take 25 mg by mouth at bedtime.     . fluticasone (FLONASE) 50 MCG/ACT nasal spray USE 2 SPRAYS IN EACH NOSTRIL TWICE DAILY. 16 g 11  . glucose blood (ONETOUCH VERIO) test strip Use as instructed to check blood sugar twice a day 100 each 3  . lamoTRIgine (LAMICTAL) 200 MG tablet Take 400 mg by mouth daily.    Marland Kitchen latanoprost (XALATAN) 0.005 % ophthalmic solution Place 1 drop into both eyes At bedtime.    Marland Kitchen levothyroxine (LEVOTHROID) 25 MCG tablet Take 1 tablet daily before breakfast with the 200 mcg 30 tablet 3  . Linaclotide (LINZESS) 145 MCG CAPS capsule Take 1 capsule (145 mcg total) by mouth daily. 30 capsule 3  . Liniments (SALONPAS PAIN RELIEF PATCH EX) Apply topically as needed.    . Multiple Vitamin (MULTIVITAMIN) tablet Take 1 tablet by mouth daily.      . naratriptan (AMERGE) 2.5 MG tablet Take 2.5 mg by mouth 2 (two) times daily as needed for migraine. Take one (1) tablet  at onset of headache; if returns  or does not resolve, may repeat after 4 hours; do not exceed five (5) mg in 24 hours.    . nitrofurantoin (MACRODANTIN) 100 MG capsule Take 100 mg by mouth as directed. With sexual activity     . Omega-3 Krill Oil 500 MG CAPS Take 500 mg by mouth.    . pantoprazole (PROTONIX) 40 MG tablet Take 1 tablet (40 mg total) by mouth 2 (two) times daily. 180 tablet 3  . Probiotic Product (PROBIOTIC DAILY PO) Take by mouth.    . Simethicone (GAS-X PO) Take 1 tablet by mouth 4 (four) times daily.    . SitaGLIPtin-MetFORMIN HCl [phone] MG TB24 1 at dinner daily 30 tablet 2  . spironolactone (ALDACTONE) 25 MG tablet Take 25 mg by mouth 2 (two) times daily.      Marland Kitchen SYNTHROID 200 MCG tablet TAKE 1 TABLET BY MOUTH EVERY DAY BEFORE BREAKFAST 30 tablet 5  . traMADol (ULTRAM) 50 MG tablet TAKE 1 TABLET BY MOUTH FOUR TIMES DAILY AS NEEDED 120 tablet 3  . valACYclovir (VALTREX) 500 MG tablet Take 500 mg by mouth 2 (two) times daily.      . vitamin C (ASCORBIC ACID) 500 MG tablet Take 250 mg by mouth daily. Takes 2 a day    . VYVANSE 50 MG capsule   0  . zolpidem (AMBIEN) 10 MG tablet Take 10 mg by mouth at bedtime.     No current facility-administered medications for this visit.    Review of Systems Review of Systems  Constitutional: Negative.   Respiratory: Negative.   Cardiovascular: Negative.     Blood pressure 138/74, pulse 74, resp. rate 16, height 5' 6.5" (1.689 m), weight 232 lb (105.235 kg).  Physical Exam Physical Exam  Constitutional: She is oriented to person, place, and time. She appears well-developed and well-nourished.  Eyes: Conjunctivae are normal. No scleral icterus.  Neck: Neck supple.  Cardiovascular: Normal rate, regular rhythm and normal heart sounds.   Pulmonary/Chest: Effort normal and breath sounds normal. Right breast exhibits no inverted nipple, no mass, no nipple discharge, no skin change and no tenderness. Left breast exhibits no inverted  nipple, no mass, no nipple discharge, no skin change and no tenderness.    Abdominal: Soft. Bowel sounds are normal. There is no tenderness.  Lymphadenopathy:    She has no cervical adenopathy.    She has no axillary adenopathy.  Neurological: She is alert and oriented to person, place, and time.  Skin: Skin is warm and dry.    Data Reviewed Mammogram, ultrasound and office notes. Pt with a spiculated mass left UIQ, suspicious microcalcifications in left inferior and in UOQ. Assessment    Left breast mass, microcalcifications as above.     Plan   Feel core biopsy of left breast uiq mass is indicated first. Pt wished to wait till tomorrow so she could use a sedative  prior to procedure The breast biopsy procedure was reviewed with the patient. The potential for bleeding, infection, and pain was reviewed. At this time, the benefits outweigh the risk and the patient is amenable to proceed tomorrow.     PCP:  [name] Ref: Dr Vivien Rossetti 04/28/2014, 9:15 AM

## 2014-05-23 NOTE — Discharge Instructions (Signed)
May change dressing after 2-3 days. Empty and record drainage 2-3 times a day. Call for any problems. No exertional activity

## 2014-05-24 ENCOUNTER — Telehealth: Payer: Self-pay | Admitting: *Deleted

## 2014-05-24 NOTE — Telephone Encounter (Signed)
She called this morning to say that she felt like the oxycodone was "too strong". She felt like headed and dizzy after taking her oxycodone, Ambien and saphris at bedtime. She was advised to only take a half tablet and space out her medications per Dr. Jamal Collin, she agrees. She will call back with progress report this afternoon.

## 2014-05-26 ENCOUNTER — Other Ambulatory Visit: Payer: Self-pay | Admitting: Family Medicine

## 2014-05-26 ENCOUNTER — Encounter: Payer: Self-pay | Admitting: General Surgery

## 2014-05-26 ENCOUNTER — Ambulatory Visit (INDEPENDENT_AMBULATORY_CARE_PROVIDER_SITE_OTHER): Payer: BLUE CROSS/BLUE SHIELD | Admitting: General Surgery

## 2014-05-26 ENCOUNTER — Ambulatory Visit: Payer: BLUE CROSS/BLUE SHIELD | Admitting: Internal Medicine

## 2014-05-26 VITALS — BP 134/70 | HR 88 | Resp 14 | Ht 66.5 in | Wt 234.0 lb

## 2014-05-26 DIAGNOSIS — C50912 Malignant neoplasm of unspecified site of left female breast: Secondary | ICD-10-CM

## 2014-05-26 LAB — SURGICAL PATHOLOGY

## 2014-05-26 NOTE — Patient Instructions (Signed)
Continue to record drainage from each breast.  Milk drain tubes regularly.  Change dressing as needed. Keep incisions clean and dry.  Follow up next week.

## 2014-05-26 NOTE — Progress Notes (Signed)
Here today for postoperative visit, bilateral mastectomy on 05-23-14. Pathology is pending. Patient states she is still sore but doing good.  She has moderate amount of thicker, darker bloody drainage from the right side.  Left side had more serosanguinous drainage.  Both incisions are intact and healing well.  Right side has more puffiness all around, but no redness or signs of infection.  Both drain tubes were milked and freed of clots. Follow up next week.

## 2014-05-27 ENCOUNTER — Ambulatory Visit: Payer: BLUE CROSS/BLUE SHIELD | Admitting: Internal Medicine

## 2014-05-30 ENCOUNTER — Telehealth: Payer: Self-pay | Admitting: *Deleted

## 2014-05-30 NOTE — Telephone Encounter (Signed)
Patient is calling wanting to know the results from her pathology on 05/23/14

## 2014-06-01 ENCOUNTER — Ambulatory Visit (INDEPENDENT_AMBULATORY_CARE_PROVIDER_SITE_OTHER): Payer: BLUE CROSS/BLUE SHIELD | Admitting: General Surgery

## 2014-06-01 ENCOUNTER — Other Ambulatory Visit: Payer: Self-pay | Admitting: Internal Medicine

## 2014-06-01 ENCOUNTER — Encounter: Payer: Self-pay | Admitting: General Surgery

## 2014-06-01 VITALS — BP 122/78 | HR 89 | Resp 14 | Ht 66.5 in | Wt 235.0 lb

## 2014-06-01 DIAGNOSIS — C50912 Malignant neoplasm of unspecified site of left female breast: Secondary | ICD-10-CM

## 2014-06-01 NOTE — Patient Instructions (Addendum)
You may shower. Return in one week. Call on Monday with drain progress. You may drive.

## 2014-06-01 NOTE — Progress Notes (Signed)
Patient ID: Lindsay Fry, female   DOB: 11-Feb-1960, 54 y.o.   MRN: [id number] Here today for postoperative visit, bilateral mastectomy on 05-23-14. She is here today with husband Antony Haste. She reports a lot of tenderness at her right drain site. She has been taking her left over Vicodin  and some tramadol for pain as she is unable to tolerate Oxycodone while on Ambien.  Overall she is doing much better this week. Path- 72m invasive CA left uiq, dcis left uoq and a lot ADH, ALH in both breasts. ER/PR pos, Her 2 neg. Drainage is still moderate, right side is old blood. Right side puffiness is resolving. Both incisions are clean and intact Continue with drains. Given her young age, high risk status, and diffuse pathology as above, will have oncology consult to see if there is any value of Oncotype PCP: Dr MLoura Pardon

## 2014-06-02 ENCOUNTER — Inpatient Hospital Stay: Payer: BLUE CROSS/BLUE SHIELD | Attending: Oncology | Admitting: Oncology

## 2014-06-02 ENCOUNTER — Encounter: Payer: Self-pay | Admitting: Oncology

## 2014-06-02 VITALS — BP 141/85 | HR 120 | Temp 97.0°F | Ht 66.5 in | Wt 235.9 lb

## 2014-06-02 DIAGNOSIS — Z9013 Acquired absence of bilateral breasts and nipples: Secondary | ICD-10-CM

## 2014-06-02 DIAGNOSIS — Z87891 Personal history of nicotine dependence: Secondary | ICD-10-CM | POA: Diagnosis not present

## 2014-06-02 DIAGNOSIS — E1165 Type 2 diabetes mellitus with hyperglycemia: Secondary | ICD-10-CM | POA: Diagnosis not present

## 2014-06-02 DIAGNOSIS — E780 Pure hypercholesterolemia: Secondary | ICD-10-CM | POA: Insufficient documentation

## 2014-06-02 DIAGNOSIS — C50912 Malignant neoplasm of unspecified site of left female breast: Secondary | ICD-10-CM | POA: Diagnosis not present

## 2014-06-02 DIAGNOSIS — E039 Hypothyroidism, unspecified: Secondary | ICD-10-CM | POA: Insufficient documentation

## 2014-06-02 DIAGNOSIS — E119 Type 2 diabetes mellitus without complications: Secondary | ICD-10-CM

## 2014-06-02 DIAGNOSIS — K219 Gastro-esophageal reflux disease without esophagitis: Secondary | ICD-10-CM | POA: Diagnosis not present

## 2014-06-02 DIAGNOSIS — Z170 Estrogen receptor positive status [ER+]: Secondary | ICD-10-CM | POA: Diagnosis not present

## 2014-06-02 DIAGNOSIS — I10 Essential (primary) hypertension: Secondary | ICD-10-CM | POA: Diagnosis not present

## 2014-06-02 DIAGNOSIS — Z853 Personal history of malignant neoplasm of breast: Secondary | ICD-10-CM | POA: Insufficient documentation

## 2014-06-02 DIAGNOSIS — Z79899 Other long term (current) drug therapy: Secondary | ICD-10-CM | POA: Diagnosis not present

## 2014-06-02 DIAGNOSIS — E669 Obesity, unspecified: Secondary | ICD-10-CM

## 2014-06-02 HISTORY — DX: Malignant neoplasm of unspecified site of left female breast: C50.912

## 2014-06-02 NOTE — Progress Notes (Signed)
Cancer Center @ [facility] Telephone:(336) [phone]  Fax:(336) [phone]   INITIAL CONSULT  Lindsay Fry OB: 1960/06/01  MR#: [id number]  LXB#:[id number]  Patient Care Team: Judy Pimple, MD as PCP - General Reather Littler, MD as Consulting Physician (Endocrinology) Milagros Evener, MD as Consulting Physician (Psychiatry) Towana Badger, MD (Obstetrics and Gynecology) Kieth Brightly, MD (General Surgery)  CHIEF COMPLAINT:  Chief Complaint  Patient presents with  . Follow-up    VISIT DIAGNOSIS:     ICD-9-CM ICD-10-CM   1. Carcinoma of left breast 174.9 C50.912      Oncology History   1.  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  Left breast: T1 b N0 M0 stage IB estrogen and progesterone receptor HER-2/neu Not overexpressed     Carcinoma of left breast   05/23/2014 Initial Diagnosis Carcinoma of left breast    Oncology Flowsheet 05/23/2014  dexamethasone (DECADRON) IJ -  ondansetron (ZOFRAN) IV -    INTERVAL HISTORY: 54 year old lady who has undergone bilateral mastectomy for abnormal mammogram previous history of ductal hyperplasia treated with tamoxifen.  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  Sentinel lymph nodes were negative.  Estrogen progesterone receptor positive HER-2/neu receptor not overexpressed  patient is here to discuss the results and further options of treatment  REVIEW OF SYSTEMS:   Gen. status: Patient is somewhat apprehensive not any acute distress.  HEENT: No headache no dizziness.  No soreness in the mouth.  Lungs: Shortness of breath on exertion.  Patient has recently found abnormal high blood sugar.  Also had abnormal Pap smear.  Being followed.  Cardiac: No chest pain is a history of hypertension.  No history of chest pain coronary artery disease  GI: No nausea no vomiting no diarrhea no abdominal pain Skin: No rash Lower extremity swelling 1+ Neurological system: No headache no dizziness no other focal signs Musculoskeletal system: Arthritis and bony pains All other systems were reviewed  As per HPI. Otherwise, a complete review of systems is negatve.  PAST MEDICAL HISTORY: Past Medical History  Diagnosis Date  . Rosacea   . Family history of malignant neoplasm of gastrointestinal tract   . Allergic rhinitis, cause unspecified   . Anxiety state, unspecified   . Unspecified asthma(493.90)   . Benign neoplasm of other and unspecified site of the digestive system   . Ulcerative colitis, unspecified   . Depression   . Fibromyalgia   . GERD (gastroesophageal reflux disease)   . Hiatal hernia   . Pure hypercholesterolemia   . Unspecified essential hypertension   . Unspecified hypothyroidism   . Insomnia, unspecified   . History of migraines   . Other screening mammogram   . Asymptomatic postmenopausal status (age-related) (natural)   . Unspecified vitamin D deficiency   . Edema   . Dysuria   . History of ovarian cyst   . Cyst  on Achilles tendon  . Tubular adenoma of colon 01/10/11  . Vertigo   . Subacute confusional state 11/19/2012  . ADD (attention deficit disorder) 11/19/2012  . OCD (obsessive compulsive disorder)   . Glaucoma   . History of alcoholism   . Bipolar disorder   . Diabetes mellitus     frank  . Atypical hyperplasia of left breast  2000  . Headache     migraines  . Arthritis   . Cancer     Left Breast 35mm invasive CA left uiq, dcis left uoq and a lot ADH, ALH in both breasts.  . Carcinoma of left breast 06/02/2014    PAST SURGICAL HISTORY: Past Surgical History  Procedure Laterality Date  . Wisdom tooth extraction  1980's  . Tonsillectomy  1984  . Cesarean section  1996/1997    placenta previa, gest DM, pre-eclampsia  . Nasal sinus surgery  05/1997  . Precancerous mole removed    . Cholecystectomy  1997    adhesions also  . Meckel diverticulum excision  1999  . Dexa  04/1999 and 2010    normal  . Colonoscopy  01/2001    Ulcerative colitis  . Esophagogastroduodenoscopy  09/2001    polyp  . Exercise stress test  11/2003    negative  . Nuclear stress test  06/2007    negative  . Colonoscopy  04/2004    UC, polyp  . Colonoscopy  12/08    UC, no polyps  . Sleep study  11/09    no apnea, but did snore (done by [facility])  . Bunionectomy Right   . Femur fracture surgery Left   . Appendectomy    . Tubal ligation    . Simple mastectomy with axillary sentinel node biopsy Bilateral 05/23/2014    Procedure: Bilateral simple mastectomy, left sentinel node biopsy ;  Surgeon: Christene Lye, MD;  Location: [facility] ORS;  Service: General;  Laterality: Bilateral;  . Axillary sentinel node biopsy Left 05/23/2014    Procedure: AXILLARY SENTINEL NODE BIOPSY;  Surgeon: Christene Lye, MD;  Location: [facility] ORS;  Service: General;  Laterality: Left;  Marland Kitchen Mastectomy Bilateral 05-23-14    Dr. Jamal Collin  . Breast biopsy  Aug 2000    on tamoxifen, atypical hyperplasia  . Breast surgery Left Aug 2000    lumpectomy/ Dr Sharlet Salina  . Breast surgery Bilateral 05/23/14    Mastectomy    FAMILY HISTORY Family History  Problem Relation Age of Onset  . Coronary artery disease Father   . Colon cancer Father   . Alzheimer's disease Father   . Dementia Father   . Coronary artery disease Mother   . Hypertension Mother   .  Osteoporosis Mother   . Colitis Mother   . Crohn's disease Mother   . Breast cancer      great aunts  . Coronary artery disease      Uncle (also AAA)  . Diabetes      remote family history  . Stroke Cousin   . Esophageal cancer Neg Hx   . Rectal cancer Neg Hx   . Stomach cancer Neg Hx     GYNECOLOGIC HISTORY:  Patient's last menstrual period was 08/23/2006 (approximate).     ADVANCED DIRECTIVES:    HEALTH MAINTENANCE: History  Substance Use Topics  . Smoking status: Former Smoker -- 0.25 packs/day for 5 years    Types: Cigarettes    Quit date: 01/14/1993  . Smokeless tobacco: Never Used  .  Alcohol Use: No     Comment: Recovered ETOH     Colonoscopy:  PAP:  Bone density:  Lipid panel:  Allergies  Allergen Reactions  . Aspirin     REACTION: aggrivates colitis  . Crestor [Rosuvastatin]     REACTION: increased lfts  . Erythromycin     REACTION: GI upset  . Nsaids     REACTION: aggrivate colitis    Current Outpatient Prescriptions  Medication Sig Dispense Refill  . acetaminophen (TYLENOL ARTHRITIS PAIN) 650 MG CR tablet Take 1,300 mg by mouth 2 (two) times daily as needed.     Marland Kitchen amLODipine (NORVASC) 10 MG tablet TAKE 1 TABLET BY MOUTH EVERY NIGHT AT BEDTIME 90 tablet 3  . Asenapine Maleate (SAPHRIS) 10 MG SUBL Place 20 mg under the tongue at bedtime.    Marland Kitchen aspirin 81 MG tablet Take 81 mg by mouth daily.    Marland Kitchen atorvastatin (LIPITOR) 40 MG tablet TAKE ONE-HALF TABLET BY MOUTH EVERY DAY 45 tablet 0  . b complex vitamins capsule Take 1 capsule by mouth daily.    Marland Kitchen buPROPion (WELLBUTRIN XL) 150 MG 24 hr tablet Take 450 mg by mouth daily.     . chlorproMAZINE (THORAZINE) 25 MG tablet Take 25-50 mg by mouth as needed. For migraines if frova doesn't help.    . Cholecalciferol (VITAMIN D3) 2000 UNITS TABS Take 1 tablet by mouth daily.    . cloNIDine (CATAPRES) 0.1 MG tablet TAKE 1 TABLET BY MOUTH TWICE DAILY 60 tablet 2  . co-enzyme Q-10 50 MG capsule Take 50 mg by mouth  daily.    . cyclobenzaprine (FLEXERIL) 10 MG tablet TAKE 2 TABLETS BY MOUTH EVERY NIGHT AT BEDTIME 180 tablet 1  . diazepam (VALIUM) 10 MG tablet Take 2.5-10 mg by mouth as needed for anxiety.    . diphenhydrAMINE (BENADRYL) 25 MG tablet Take by mouth at bedtime.     . diphenhydrAMINE (BENADRYL) 50 MG tablet Take 50 mg by mouth at bedtime as needed for itching.    . fluticasone (FLONASE) 50 MCG/ACT nasal spray USE 2 SPRAYS IN EACH NOSTRIL TWICE DAILY. 16 g 11  . glucose blood (ONETOUCH VERIO) test strip Use as instructed to check blood sugar twice a day 100 each 3  . HYDROcodone-acetaminophen (NORCO/VICODIN) 5-325 MG per tablet Take 1 tablet by mouth every 6 (six) hours as needed for moderate pain.    Marland Kitchen lamoTRIgine (LAMICTAL) 200 MG tablet Take 400 mg by mouth daily.    Marland Kitchen latanoprost (XALATAN) 0.005 % ophthalmic solution Place 1 drop into both eyes At bedtime.    Marland Kitchen levothyroxine (LEVOTHROID) 25 MCG tablet Take 1 tablet daily before breakfast with the 200 mcg 30 tablet 3  . Linaclotide (LINZESS) 145 MCG CAPS capsule Take 1 capsule (145 mcg total) by mouth daily. 30 capsule 3  . Liniments (SALONPAS PAIN RELIEF PATCH EX) Apply topically as needed.    Marland Kitchen lisdexamfetamine (VYVANSE) 20 MG capsule Take 20 mg by mouth daily.    . Multiple Vitamin (MULTIVITAMIN) tablet Take 1 tablet by mouth daily.      . naratriptan (AMERGE) 2.5 MG tablet Take 2.5 mg by mouth 2 (two) times daily as needed for migraine. Take one (1) tablet at onset of headache; if returns or does not resolve, may repeat after 4 hours; do not exceed five (5) mg in 24 hours.    . nitrofurantoin (MACRODANTIN) 100 MG capsule Take 100 mg by mouth as directed. With sexual activity     .  Omega-3 Krill Oil 500 MG CAPS Take 500 mg by mouth.    . pantoprazole (PROTONIX) 40 MG tablet TAKE 1 TABLET BY MOUTH TWICE DAILY 180 tablet 1  . Probiotic Product (PROBIOTIC DAILY PO) Take by mouth.    . Simethicone (GAS-X PO) Take 1 tablet by mouth 4 (four)  times daily.    . SitaGLIPtin-MetFORMIN HCl [id number] MG TB24 1 at dinner daily 30 tablet 2  . spironolactone (ALDACTONE) 25 MG tablet Take 25 mg by mouth 2 (two) times daily.      Marland Kitchen SYNTHROID 200 MCG tablet TAKE 1 TABLET BY MOUTH EVERY DAY BEFORE BREAKFAST 30 tablet 5  . traMADol (ULTRAM) 50 MG tablet TAKE 1 TABLET BY MOUTH FOUR TIMES DAILY AS NEEDED 120 tablet 3  . valACYclovir (VALTREX) 500 MG tablet Take 500 mg by mouth 2 (two) times daily.      . vitamin C (ASCORBIC ACID) 500 MG tablet Take 250 mg by mouth daily. Takes 2 a day    . zolpidem (AMBIEN) 10 MG tablet Take 10 mg by mouth at bedtime.     No current facility-administered medications for this visit.    OBJECTIVE: PHYSICAL EXAM: General status:   obese lady not any acute distress Lymphatic system: No palpable supraclavicular cervical axillary quadrant adenopathy Head exam was generally normal. There was no scleral icterus or corneal arcus. Mucous membranes were moist. Chest: Diminished air entry on both sides.  No rhonchi.  No rales. Cardiac: Distant heart sounds Abdominal exam revealed normal bowel sounds. The abdomen was soft, non-tender, and without masses, organomegaly, or appreciable enlargement of the abdominal aorta.  Difficult to palpate organs because of obesity Neurologically, the patient was awake, alert, and oriented to person, place and time. There were no obvious focal neurologic abnormalities. Skin: Multiple skin infection Lower extremity edema 1+ Musculoskeletal system no joint swelling pain  Filed Vitals:   06/02/14 0909  BP: 141/85  Pulse: 120  Temp: 97 F (36.1 C)     Body mass index is 37.51 kg/(m^2).    ECOG FS:0 - Asymptomatic  LAB RESULTS:  No visits with results within 3 Day(s) from this visit. Latest known visit with results is:  Admission on 05/23/2014, Discharged on 05/23/2014  Component Date Value Ref Range Status  . Glucose-Capillary 05/23/2014 125* 70 - 99 mg/dL Final  . SURGICAL  PATHOLOGY 05/23/2014    Final                   Value:Surgical Pathology CASE: [id number] PATIENT: Lindsay Fry Surgical Pathology Report     SPECIMEN SUBMITTED: A. Lymph node, non- sentinel B. Lymph node, sentinel C. Breast, left, simple mastectomy D. Breast, right simple mastectomy, stitch at lateral end  CLINICAL HISTORY: None provided  PRE-OPERATIVE DIAGNOSIS: CA left breast, Left breast mass  POST-OPERATIVE DIAGNOSIS:      DIAGNOSIS: A. NON-SENTINEL LYMPH NODE; EXCISION: - NO TUMOR SEEN IN ONE LYMPH NODE (0/1).  B. SENTINEL LYMPH NODE; EXCISION: - NO TUMOR SEEN IN ONE SENTINEL LYMPH NODE (0/1).  C. LEFT BREAST; SIMPLE MASTECTOMY: - INVASIVE MAMMARY CARCINOMA OF NO SPECIAL TYPE. - MULTIFOCAL DUCTAL CARCINOMA IN SITU, NUCLEAR GRADE 2, CRIBRIFORM AND PAPILLARY TYPES. - BIOPSY SITE CHANGES (THREE SITES). - ATYPICAL LOBULAR HYPERPLASIA. - FLAT EPITHELIAL ATYPIA WITH ASSOCIATED MICROCALCIFICATIONS. - FIBROCYSTIC CHANGES WITH ASSOCIATE MICROCALCIFICATIONS. - THE MARGINS OF EXCISION ARE NEGATIV  E. - SEE CANCER CASE SUMMARY BELOW.  D. RIGHT BREAST; SIMPLE MASTECTOMY: - ATYPICAL DUCTAL HYPERPLASIA. - ATYPICAL LOBULAR HYPERPLASIA. - FLAT EPITHELIAL ATYPIA. - MULTIFOCAL INTRADUCTAL PAPILLOMAS, UP TO 3 MM. - FIBROCYSTIC CHANGES WITH ASSOCIATE MICROCALCIFICATIONS. - THE MARGINS OF EXCISION APPEAR NEGATIVE.  Note The tumor size is measured from the original biopsy sample [id number] (part A). Ductal carcinoma in situ is seen in association with the focus of invasion (#1) and in the separately sampled area previously described as left upper outer quadrant on the biopsy sample [id number] (#2). Ductal carcinoma in situ is also seen in the randomly sampled upper inner quadrant. DCIS is found 35 mm from the anterior/skin margin. Invasive mammary carcinoma is seen 30 mm from the deep margin. The third biopsy cavity (#3) shows ADH, ALH, FEA  and fibrocystic changes with microcalcifications. Immunohistochemical stains for ER PR and HER-2 were performed on a separate spe                         cimen ([id number]). In summary: ER positive (>90%) PR positive (>90%) HER 2 by IHC negative.   INVASIVE CARCINOMA OF THE BREAST: Complete Excision (Less Than Total Mastectomy, Including Specimens Designated Biopsy, Lumpectomy, Quadrantectomy, and Partial Mastectomy With or Without Axillary Contents) and Mastectomy (Total, Modified Radical, Radical With or Without Axillary Contents)Radical, Radical With or Without Axillary Contents) Specimens InvolvedC: Breast, left, simple mastectomy  Breast Invasive Carcinoma Cancer Case Summary SPECIMEN Procedure:     Total mastectomy (including nipple and skin) Lymph Node Sampling:     Sentinel lymph node(s) Other lymph nodes Other Lymph Nodes Sampled:    Other (specify) Non-Sentinel LN NOS Specimen Laterality:     Left TUMOR Presence of Invasive Carcinoma:    Histologic Type of Invasive Carcinoma: Invasive ductal carcinoma (no special type or not otherwise specified) Histologic Grade: Nottingham Histologic Score Glandular                          (Acinar) / Tubular Differentiation: Score 2: 10 to 75% of tumor area forming glandular / tubular structures Nuclear Pleomorphism: Score 1: Nuclei small with little increase in size in comparison with normal breast epithelial cells, regular outlines, uniform nuclear chromatin, little variation in size Mitotic Rate Score 1 (<=3 mitoses per mm2) Overall Grade: Grade 1: scores of 3, 4 or 5 Ductal Carcinoma In Situ (DCIS):   DCIS is present EXTENT Tumor Size / Focality:   Tumor Size: Size of Largest Invasive Carcinoma: Greatest dimension of largest focus of invasion > 1 mm Greatest Dimension (mm): 0.6cm Macroscopic and Microscopic Extent of Tumor Skin:     Skin Satellite Foci:     N/A Nipple DCIS:   N/A MARGINS Invasive Carcinoma: Margins  uninvolved by invasive carcinoma Distance from Closest Margin  (mm):     Distance (specify in mm) 19mm Ductal Carcinoma In Situ (DCIS):   Margins uninvolved by DCIS (DCIS present in specimen) Distance of DCIS from Closest Margin (mm                         ): Distance (specify in mm) 51mm LYMPH NODES Status of Lymph Nodes:   Total Number of Lymph Nodes Examined:   Specify 2 Micro / Macro Metastases:     Not identified Not identified Number of Lymph Nodes with Isolated Tumor Cells (<= 0.2 mm and <= 200 cells):   None Number  of Sentinel Lymph Nodes Examined:     Specify 1 ACCESSORY FINDINGS Lymph-Vascular Invasion: Not Identified STAGE (pTNM) TNM Descriptors:    N/A Primary Tumor (Invasive Carcinoma) (pT): pT1b:  Tumor > 5 mm but <= 10 mm in greatest dimension Regional Lymph Nodes (pN) (choose a category based on lymph nodes received with the specimen;  immunohistochemistry and / or molecular studies are not required) Category (pN): pN0: No regional lymph node metastasis identified histologically Distant Metastasis (pM): N/A    GROSS DESCRIPTION:  A. Intra Operative Consultation:     Received:  fresh     Specimen: left non-sentinel node #1     Pathologic Evaluation: frozen section     Diagnosis: Non- Sentin                         el lymph node #1, excision: No evidence of macro metastasis     Communicated to: Dr. Jamal Collin at 11:24 AM on 05/23/2014     Tissue submitted: 1  Labeled: lymph node #1 non-sentinel Tissue Fragment(s): 1 Measurement: 2.0 x 1.0 x 0.6 cm Comment: specimen bisected and frozen, following frozen section, formalin fixed  Entirely submitted in cassette(s): 1  B. Intra Operative Consultation:     Received:  fresh     Specimen: left sentinel node #1     Pathologic Evaluation: frozen section     Diagnosis: Sentinel lymph node #1, excision: No evidence of macro metastasis     Communicated to: Dr. Jamal Collin at 11:24 AM on 05/23/2014     Tissue  submitted: 1  Labeled: left sentinel node #1 Tissue Fragment(s): 1 Measurement: 2.0 x 1.4 x 0.7 cm Comment: bisected and frozen, following frozen section is formalin fixed  Entirely submitted in cassette(s): 1  C. Labeled: left breast stitch on lateral  Time in Fixative: 11:54 AM on 05/23/2014  Total fixation time: 8.25 hours  Col                         d Ischemic Time: 30 minutes  Type of mastectomy: simple  Weight of Specimen: 1609 grams  Size of specimen: 25 x 23 x 4.2 cm  Orientation of specimen: lateral with suture marked orange, posterior black and remaining blue  Skin ellipse dimensions  description: 19.5 x 12.2 cm  Nipple/ areola: nipple 1.0 x 1.1 cm, areola 6.0 x 3.5 cm  Axillary tail: absent  Biopsy site(s): left  Discrete Mass(es):  3 biopsy sites  Number of Discrete Masses: 3 biopsy sites/cavities  Location of mass(es):    #1-11:00, #2-6:00 and #3 approximate 3:00  Distance between masses:      #1 to #2: 7.6 cm, #1 to #3: 7 cm,  #2 to #3: 8.5 cm  Size of mass(es)/biopsy site: #1-1.8 x 1.2 x 0.8 cm, #2- 2.5 x 0.9 x 0.9 cm and #3-ill-defined area of nodularity and cysts with 3.0 x 1.0 x 1.0 cm area of hemorrhage  Description of mass/biopsy site: #1and #2-blood clot filled cavities with surrounding chalky yellow areas and #3 dense fibrous focally cystic area with focal area of hemorrhage  D                         istance of mass/biopsy site from surgical margins: #1-3.1 cm from deep, 6.5 cm from skin and 3.7 cm remaining margin #2-5.1 cm from deep, 3.5 cm from skin and 4.6 cm from remaining margin #  3-4.6 cm from deep, 4.6 cm from skin and 2.8 cm from remaining margin  Gross involvement of skin/fascia/muscle by tumor: not grossly identified  Description of remaining breast: yellow lobulated fibrofatty with focal cysts  Other remarkable features: none noted  Block Summary: 1-3-representative mass #1 4-perpendicular deep to mass  #1 5-8-representative mass #2 9-perpendicular deep to mass #2 10-tissue between masses #1 and #2 11-tissue between mass #1 and #3 12-tissue between mass #2 and #3 13-15-representative mass #3 area 16-perpendicular deep to mass #3 17-representative upper outer quadrant 18-representative lower outer quadrant 19-representative upper inner quadrant 20-representative lower inner quadrant 21-representative nipple and areola  D. Labeled: right breast s                         titch on lateral  Time in Fixative: 12:10 PM on 05/23/2014 Total fixation time 8 hours Cold Ischemic Time: 7 minutes  Type of mastectomy: simple  Weight of Specimen: 1409 grams  Size of specimen: 20.7 x 18.5 x 5.0 cm  Orientation of specimen: lateral aspect yellow, deep black and remaining blue  Skin ellipse dimensions  description: 20.5 x 4.6  Nipple/ areola: nipple-1.1 x 1.3 cm and areola 5.6 x 3.7 cm  Axillary tail: absent   Biopsy site(s): absent  Discrete Mass(es):  not identified  Number of Discrete Masses: none identified  Location of mass(es):    not applicable  Distance between masses:      not applicable  Size of mass(es)/biopsy site: not applicable  Description of mass/biopsy site: not applicable  Distance of mass/biopsy site from surgical margins: not applicable  Gross involvement of skin/fascia/muscle by tumor: not identified  Description of remaining breast: yellow lobulated fibrofatty with dense fibrous areas containing unilocular                          cysts  Other remarkable features: none noted  Block Summary: 1-2-representative upper outer quadrant 3-4-representative lower outer quadrant 5-6-representative upper inner quadrant 7-8-representative lower inner quadrant 9-representative nipple and areola          Final Diagnosis performed by Delorse Lek, MD.  Electronically signed 05/26/2014 3:18:57PM    The electronic signature indicates that the named  Attending Pathologist has evaluated the specimen  Technical component performed at [facility], [address], [place], [place] [address] Lab: [phone] Dir: Darrick Penna. Evette Doffing, MD  Professional component performed at [facility], [facility], [place], [place], [place] [address] Lab: [phone] Dir: Dellia Nims. Rubinas, MD    . Glucose-Capillary 05/23/2014 170* 70 - 99 mg/dL Final     STUDIES: Nm Sentinel Node Inj-no Rpt (breast)  05/23/2014   CLINICAL DATA: carcinoma left breast   Sulfur colloid was injected intradermally by the nuclear medicine  technologist for breast cancer sentinel node localization.     ASSESSMENT:  1.  Carcinoma of left breast status post bilateral mastectomy by patient's choice Stage IB.  Estrogen receptor progesterone receptor positive HER-2/neu receptor  not overexpressed 2.  Obesity.  Hypertension.  Hyperglycemia.  Hyperlipidemia  PLAN:  1.carcinoma of breast stage IB status post bilateral mastectomy  I had prolonged discussion with patient.  Regarding local therapy patient does not need any further radiation treatment. Regarding systemic adjuvant therapy be discuss possibility of from anti-hormonal therapy with letrozole. Question was because of patient's multiple changes in both breast whether patient would be a candidate for systemic therapy with chemotherapy.  We discussed pros  and cones of running mammogram and/or Oncotype DX studies.  If individually patient risk is very high that patient may benefit by adding chemotherapy and that issue has been discussed with patient and family did desires strongly to carry out that test because of multiple family issues with cancer.Narda Rutherford patient after mammogram and Oncotype DX score available. Patient expressed understanding and was in agreement with this plan. She also understands that She can call clinic at any time with any questions, concerns, or complaints.    Carcinoma of left  breast   Staging form: Breast, AJCC 7th Edition     Clinical: Stage IA (T1b, N0, M0) - Unsigned       Staging comments: Kidney frequent changes in the 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  No invasive cancer in the right breast.  Status post bilateral mastectomy    Forest Gleason, MD   06/02/2014 10:15 AM

## 2014-06-03 ENCOUNTER — Telehealth: Payer: Self-pay | Admitting: *Deleted

## 2014-06-03 NOTE — Telephone Encounter (Signed)
Called and talked with patient today.  I had left patient breast cancer educational literature, "My Breast Cancer Treatment Handbook" by Josephine Igo, RN to be picked up yesterday at her initial medical oncology visit.  Reviewed plan of care.  Offered support.  She is very interested in our survivorship programs.  Sent her email address to Carlena Hurl, our outreach coordinator to send her our monthly calendar of events.  She is to call if she has any questions or needs.

## 2014-06-07 ENCOUNTER — Other Ambulatory Visit (INDEPENDENT_AMBULATORY_CARE_PROVIDER_SITE_OTHER): Payer: BLUE CROSS/BLUE SHIELD

## 2014-06-07 DIAGNOSIS — E119 Type 2 diabetes mellitus without complications: Secondary | ICD-10-CM | POA: Diagnosis not present

## 2014-06-07 DIAGNOSIS — E890 Postprocedural hypothyroidism: Secondary | ICD-10-CM

## 2014-06-07 LAB — COMPREHENSIVE METABOLIC PANEL
ALT: 18 U/L (ref 0–35)
AST: 14 U/L (ref 0–37)
Albumin: 3.7 g/dL (ref 3.5–5.2)
Alkaline Phosphatase: 87 U/L (ref 39–117)
BUN: 13 mg/dL (ref 6–23)
CO2: 25 mEq/L (ref 19–32)
Calcium: 9.1 mg/dL (ref 8.4–10.5)
Chloride: 102 mEq/L (ref 96–112)
Creatinine, Ser: 0.82 mg/dL (ref 0.40–1.20)
GFR: 76.68 mL/min (ref >60.00)
Glucose, Bld: 137 mg/dL — ABNORMAL HIGH (ref 70–99)
Potassium: 4.4 mEq/L (ref 3.5–5.1)
Sodium: 135 mEq/L (ref 135–145)
Total Bilirubin: 0.4 mg/dL (ref 0.2–1.2)
Total Protein: 6.7 g/dL (ref 6.0–8.3)

## 2014-06-07 LAB — TSH: TSH: 20.09 u[IU]/mL — ABNORMAL HIGH (ref 0.35–4.50)

## 2014-06-07 LAB — HEMOGLOBIN A1C: Hgb A1c MFr Bld: 5.4 % (ref 4.6–6.5)

## 2014-06-08 ENCOUNTER — Other Ambulatory Visit: Payer: Self-pay | Admitting: Family Medicine

## 2014-06-09 ENCOUNTER — Encounter: Payer: Self-pay | Admitting: General Surgery

## 2014-06-09 ENCOUNTER — Ambulatory Visit (INDEPENDENT_AMBULATORY_CARE_PROVIDER_SITE_OTHER): Payer: BLUE CROSS/BLUE SHIELD | Admitting: General Surgery

## 2014-06-09 VITALS — BP 166/96 | HR 98 | Resp 18 | Ht 66.0 in | Wt 233.0 lb

## 2014-06-09 DIAGNOSIS — C50912 Malignant neoplasm of unspecified site of left female breast: Secondary | ICD-10-CM

## 2014-06-09 MED ORDER — SULFAMETHOXAZOLE-TRIMETHOPRIM 400-80 MG PO TABS: 1.0000 | ORAL_TABLET | Freq: Two times a day (BID) | ORAL | Status: AC

## 2014-06-09 NOTE — Progress Notes (Signed)
This is a 54 year old female here today for postoperative visit, bilateral mastectomy on 05-23-14. Drain sheet present. Right breast drainage is not old blood anymore-it is serous mostly. Left drain has a whitish yellow color c/s done. Drainage amounts are still moderate Mastectomy site are healing well.  Pt to call next week with drainage amounts. See me in 2 weeks

## 2014-06-09 NOTE — Patient Instructions (Signed)
Patient ton see nurse next week

## 2014-06-10 ENCOUNTER — Encounter: Payer: Self-pay | Admitting: Endocrinology

## 2014-06-10 ENCOUNTER — Telehealth: Payer: Self-pay

## 2014-06-10 ENCOUNTER — Ambulatory Visit (INDEPENDENT_AMBULATORY_CARE_PROVIDER_SITE_OTHER): Payer: BLUE CROSS/BLUE SHIELD | Admitting: Endocrinology

## 2014-06-10 ENCOUNTER — Ambulatory Visit: Payer: BLUE CROSS/BLUE SHIELD | Admitting: Endocrinology

## 2014-06-10 VITALS — BP 126/74 | HR 110 | Temp 98.4°F | Resp 16 | Ht 66.0 in | Wt 233.6 lb

## 2014-06-10 DIAGNOSIS — E890 Postprocedural hypothyroidism: Secondary | ICD-10-CM | POA: Diagnosis not present

## 2014-06-10 DIAGNOSIS — E119 Type 2 diabetes mellitus without complications: Secondary | ICD-10-CM

## 2014-06-10 MED ORDER — SYNTHROID 137 MCG PO TABS: ORAL_TABLET | ORAL | Status: DC

## 2014-06-10 NOTE — Patient Instructions (Signed)
Take 3 of the 25ug Till gone  Walk daily

## 2014-06-10 NOTE — Progress Notes (Signed)
Patient ID: Lindsay Fry, female   DOB: 01/24/1960, 54 y.o.   MRN: [id number]   Reason for Appointment:  Hypothyroidism and diabetes, followup visit  History of Present Illness:  HYPOTHYROIDISM: This was first diagnosed in 1992 after her treatment for Graves' disease with I-131 She has been on relatively large doses of thyroxine supplements She was on 224 mcg since 11/13 and her TSH was normal in 3/14 Subjectively difficult to assess her thyroid since she tends to have fatigue chronically.  In 2014 her dose was reduced to 200 mcg daily and her dose has been fluctuating since then She is now on 200 mcg, 7 tablets a week since 04/2013  She is still on the brand name Synthroid  Her symptoms are variable and usually nonspecific for hypothyroidism, mostly related to other medical problems including sleep disturbances and bipolar illness.  Also recently because of her mastectomies she has had more stress and this has caused her to be more tired Does not feel unusually cold with has not been as high as before Because of her high TSH her dose was increased by 25 g in 2/16 and she is now taking 225 g daily She has been quite regular with the medication including right before the lab         She is taking the tablet in the morning daily before breakfast without any multivitamins, calcium or iron.   Not clear why her TSH is significantly higher now  Lab Results  Component Value Date   TSH 20.09* 06/07/2014   TSH 1.21 04/14/2014   TSH 7.64* 02/17/2014   FREET4 0.99 02/17/2014   FREET4 1.02 07/29/2013   FREET4 1.01 04/05/2013     DIABETES: Because of her high A1c of 6.7 she was sent for a glucose tolerance test and this was significantly abnormal with fasting glucose 164 and 1 hour reading of 300. She was started on metformin; however even with 1500 mg of metformin ER her blood sugars were not controlled especially fasting She also is trying to improve diet with reducing  carbohydrates and calories but has had more stress lately and is not able to follow diet consistently Also because of other medical issues she has not been able to exercise Has gained back some weight  Recent glucose levels: FASTING 103-141 and nonfasting 121-178 Overall median 132  Weight history:  Wt Readings from Last 3 Encounters:  06/10/14 233 lb 9.6 oz (105.96 kg)  06/09/14 233 lb (105.688 kg)  06/02/14 235 lb 14.3 oz (107 kg)   LABS:  Lab Results  Component Value Date   HGBA1C 5.4 06/07/2014   HGBA1C 6.7* 02/17/2014   HGBA1C 6.2 07/29/2013   Lab Results  Component Value Date   LDLCALC 112* 05/12/2013   CREATININE 0.82 06/07/2014    Lab on 06/07/2014  Component Date Value Ref Range Status  . Hgb A1c MFr Bld 06/07/2014 5.4  4.6 - 6.5 % Final   Glycemic Control Guidelines for People with Diabetes:Non Diabetic:  <6%Goal of Therapy: <7%Additional Action Suggested:  >8%   . Sodium 06/07/2014 135  135 - 145 mEq/L Final  . Potassium 06/07/2014 4.4  3.5 - 5.1 mEq/L Final  . Chloride 06/07/2014 102  96 - 112 mEq/L Final  . CO2 06/07/2014 25  19 - 32 mEq/L Final  . Glucose, Bld 06/07/2014 137* 70 - 99 mg/dL Final  . BUN 06/07/2014 13  6 - 23 mg/dL Final  . Creatinine, Ser 06/07/2014 0.82  0.40 -  1.20 mg/dL Final  . Total Bilirubin 06/07/2014 0.4  0.2 - 1.2 mg/dL Final  . Alkaline Phosphatase 06/07/2014 87  39 - 117 U/L Final  . AST 06/07/2014 14  0 - 37 U/L Final  . ALT 06/07/2014 18  0 - 35 U/L Final  . Total Protein 06/07/2014 6.7  6.0 - 8.3 g/dL Final  . Albumin 06/07/2014 3.7  3.5 - 5.2 g/dL Final  . Calcium 06/07/2014 9.1  8.4 - 10.5 mg/dL Final  . GFR 06/07/2014 76.68  >60.00 mL/min Final  . TSH 06/07/2014 20.09* 0.35 - 4.50 uIU/mL Final      Medication List       This list is accurate as of: 06/10/14 12:55 PM.  Always use your most recent med list.               amLODipine 10 MG tablet  Commonly known as:  NORVASC  TAKE ONE TABLET BY MOUTH EVERY NIGHT  AT BEDTIME     aspirin 81 MG tablet  Take 81 mg by mouth daily.     atorvastatin 40 MG tablet  Commonly known as:  LIPITOR  TAKE ONE-HALF TABLET BY MOUTH EVERY DAY     b complex vitamins capsule  Take 1 capsule by mouth daily.     buPROPion 150 MG 24 hr tablet  Commonly known as:  WELLBUTRIN XL  Take 450 mg by mouth daily.     chlorproMAZINE 25 MG tablet  Commonly known as:  THORAZINE  Take 25-50 mg by mouth as needed. For migraines if frova doesn't help.     cloNIDine 0.1 MG tablet  Commonly known as:  CATAPRES  TAKE 1 TABLET BY MOUTH TWICE DAILY     co-enzyme Q-10 50 MG capsule  Take 50 mg by mouth daily.     cyclobenzaprine 10 MG tablet  Commonly known as:  FLEXERIL  TAKE 2 TABLETS BY MOUTH EVERY NIGHT AT BEDTIME     diazepam 10 MG tablet  Commonly known as:  VALIUM  Take 2.5-10 mg by mouth as needed for anxiety.     diphenhydrAMINE 25 MG tablet  Commonly known as:  BENADRYL  Take by mouth at bedtime.     diphenhydrAMINE 50 MG tablet  Commonly known as:  BENADRYL  Take 50 mg by mouth at bedtime as needed for itching.     fluticasone 50 MCG/ACT nasal spray  Commonly known as:  FLONASE  USE 2 SPRAYS IN EACH NOSTRIL TWICE DAILY.     GAS-X PO  Take 1 tablet by mouth 4 (four) times daily.     glucose blood test strip  Commonly known as:  ONETOUCH VERIO  Use as instructed to check blood sugar twice a day     HYDROcodone-acetaminophen 5-325 MG per tablet  Commonly known as:  NORCO/VICODIN  Take 1 tablet by mouth every 6 (six) hours as needed for moderate pain.     lamoTRIgine 200 MG tablet  Commonly known as:  LAMICTAL  Take 400 mg by mouth daily.     latanoprost 0.005 % ophthalmic solution  Commonly known as:  XALATAN  Place 1 drop into both eyes At bedtime.     levothyroxine 25 MCG tablet  Commonly known as:  LEVOTHROID  Take 1 tablet daily before breakfast with the 200 mcg     SYNTHROID 200 MCG tablet  Generic drug:  levothyroxine  TAKE 1 TABLET  BY MOUTH EVERY DAY BEFORE BREAKFAST     Linaclotide 145 MCG  Caps capsule  Commonly known as:  LINZESS  Take 1 capsule (145 mcg total) by mouth daily.     lisdexamfetamine 20 MG capsule  Commonly known as:  VYVANSE  Take 20 mg by mouth daily.     multivitamin tablet  Take 1 tablet by mouth daily.     naratriptan 2.5 MG tablet  Commonly known as:  AMERGE  Take 2.5 mg by mouth 2 (two) times daily as needed for migraine. Take one (1) tablet at onset of headache; if returns or does not resolve, may repeat after 4 hours; do not exceed five (5) mg in 24 hours.     nitrofurantoin 100 MG capsule  Commonly known as:  MACRODANTIN  Take 100 mg by mouth as directed. With sexual activity     Omega-3 Krill Oil 500 MG Caps  Take 500 mg by mouth.     pantoprazole 40 MG tablet  Commonly known as:  PROTONIX  TAKE 1 TABLET BY MOUTH TWICE DAILY     PROBIOTIC DAILY PO  Take by mouth.     SALONPAS PAIN RELIEF PATCH EX  Apply topically as needed.     SAPHRIS 10 MG Subl  Generic drug:  Asenapine Maleate  Place 20 mg under the tongue at bedtime.     SitaGLIPtin-MetFORMIN HCl 878 [id number] 4751 MG Tb24  1 at dinner daily     spironolactone 25 MG tablet  Commonly known as:  ALDACTONE  Take 25 mg by mouth 2 (two) times daily.     sulfamethoxazole-trimethoprim 400-80 MG per tablet  Commonly known as:  BACTRIM  Take 1 tablet by mouth 2 (two) times daily.     traMADol 50 MG tablet  Commonly known as:  ULTRAM  TAKE 1 TABLET BY MOUTH FOUR TIMES DAILY AS NEEDED     TYLENOL ARTHRITIS PAIN 650 MG CR tablet  Generic drug:  acetaminophen  Take 1,300 mg by mouth 2 (two) times daily as needed.     valACYclovir 500 MG tablet  Commonly known as:  VALTREX  Take 500 mg by mouth 2 (two) times daily.     vitamin C 500 MG tablet  Commonly known as:  ASCORBIC ACID  Take 250 mg by mouth daily. Takes 2 a day     Vitamin D3 2000 UNITS Tabs  Take 1 tablet by mouth daily.     zolpidem 10 MG tablet  Commonly  known as:  AMBIEN  Take 10 mg by mouth at bedtime.        Allergies:  Allergies  Allergen Reactions  . Aspirin     REACTION: aggrivates colitis  . Crestor [Rosuvastatin]     REACTION: increased lfts  . Erythromycin     REACTION: GI upset  . Nsaids     REACTION: aggrivate colitis    Past Medical History  Diagnosis Date  . Rosacea   . Family history of malignant neoplasm of gastrointestinal tract   . Allergic rhinitis, cause unspecified   . Anxiety state, unspecified   . Unspecified asthma(493.90)   . Benign neoplasm of other and unspecified site of the digestive system   . Ulcerative colitis, unspecified   . Depression   . Fibromyalgia   . GERD (gastroesophageal reflux disease)   . Hiatal hernia   . Pure hypercholesterolemia   . Unspecified essential hypertension   . Unspecified hypothyroidism   . Insomnia, unspecified   . History of migraines   . Other screening mammogram   . Asymptomatic postmenopausal status (age-related) (  natural)   . Unspecified vitamin D deficiency   . Edema   . Dysuria   . History of ovarian cyst   . Cyst     on Achilles tendon  . Tubular adenoma of colon 01/10/11  . Vertigo   . Subacute confusional state 11/19/2012  . ADD (attention deficit disorder) 11/19/2012  . OCD (obsessive compulsive disorder)   . Glaucoma   . History of alcoholism   . Bipolar disorder   . Diabetes mellitus     frank  . Atypical hyperplasia of left breast 2000  . Headache     migraines  . Arthritis   . Cancer     Left Breast 33mm invasive CA left uiq, dcis left uoq and a lot ADH, ALH in both breasts.  . Carcinoma of left breast 06/02/2014    Past Surgical History  Procedure Laterality Date  . Wisdom tooth extraction  1980's  . Tonsillectomy  1984  . Cesarean section  1996/1997    placenta previa, gest DM, pre-eclampsia  . Nasal sinus surgery  05/1997  . Precancerous mole removed    . Cholecystectomy  1997    adhesions also  . Meckel diverticulum  excision  1999  . Dexa  04/1999 and 2010    normal  . Colonoscopy  01/2001    Ulcerative colitis  . Esophagogastroduodenoscopy  09/2001    polyp  . Exercise stress test  11/2003    negative  . Nuclear stress test  06/2007    negative  . Colonoscopy  04/2004    UC, polyp  . Colonoscopy  12/08    UC, no polyps  . Sleep study  11/09    no apnea, but did snore (done by [facility])  . Bunionectomy Right   . Femur fracture surgery Left   . Appendectomy    . Tubal ligation    . Simple mastectomy with axillary sentinel node biopsy Bilateral 05/23/2014    Procedure: Bilateral simple mastectomy, left sentinel node biopsy ;  Surgeon: Christene Lye, MD;  Location: [facility];  Service: General;  Laterality: Bilateral;  . Axillary sentinel node biopsy Left 05/23/2014    Procedure: AXILLARY SENTINEL NODE BIOPSY;  Surgeon: Christene Lye, MD;  Location: [facility];  Service: General;  Laterality: Left;  Marland Kitchen Mastectomy Bilateral 05-23-14    Dr. Jamal Collin  . Breast biopsy  Aug 2000    on tamoxifen, atypical hyperplasia  . Breast surgery Left Aug 2000    lumpectomy/ Dr Sharlet Salina  . Breast surgery Bilateral 05/23/14    Mastectomy    Family History  Problem Relation Age of Onset  . Coronary artery disease Father   . Colon cancer Father   . Alzheimer's disease Father   . Dementia Father   . Coronary artery disease Mother   . Hypertension Mother   . Osteoporosis Mother   . Colitis Mother   . Crohn's disease Mother   . Breast cancer      great aunts  . Coronary artery disease      Uncle (also AAA)  . Diabetes      remote family history  . Stroke Cousin   . Esophageal cancer Neg Hx   . Rectal cancer Neg Hx   . Stomach cancer Neg Hx     Social History:  reports that she quit smoking about 21 years ago. Her smoking use included Cigarettes. She has a 1.25 pack-year smoking history. She has never used smokeless tobacco. She reports  that she does not drink alcohol or use illicit drugs.  REVIEW  Of SYSTEMS:  Blood pressure is markedly high but she did not take her clonidine this morning   Examination:   BP 126/74 mmHg  Pulse 110  Temp(Src) 98.4 F (36.9 C)  Resp 16  Ht 5\' 6"  (1.676 m)  Wt 233 lb 9.6 oz (105.96 kg)  BMI 37.72 kg/m2  SpO2 97%  LMP 08/23/2006 (Approximate)  She looks welll    Assessments   Hypothyroidism, post ablative:  She has had long-standing hypothyroidism and has needed variable doses  Currently she has a high TSH of about 20 which is unexplained by either compliance difficulties or any drug interaction She has had some problems with recent stress and surgery but this should not affect her thyroid requirement She appears to be needing much higher doses than usual now, currently taking 225 g  For now will increase her dose to 2 tablets of 137 g and follow-up in about 2 months  DIABETES: She has mild diabetes and is tolerating Janumet XR which appears to be doing better than metformin alone Baseline A1c was 6.7 and it is now down to 5.4 However her blood sugars are still not near normal fasting She is generally watching her diet but not able to exercise as yet Also not losing weight  Will continue on her regimen unchanged but try to improve her exercise regimen and consistent diet   Patient Instructions  Take 3 of the 25ug Till gone  Walk daily    Lindsay Fry 06/10/2014, 12:55 PM

## 2014-06-13 LAB — ANAEROBIC AND AEROBIC CULTURE

## 2014-06-16 ENCOUNTER — Telehealth: Payer: Self-pay | Admitting: *Deleted

## 2014-06-16 NOTE — Telephone Encounter (Signed)
Calling to check on drain amounts.

## 2014-06-21 ENCOUNTER — Encounter: Payer: Self-pay | Admitting: General Surgery

## 2014-06-21 ENCOUNTER — Ambulatory Visit (INDEPENDENT_AMBULATORY_CARE_PROVIDER_SITE_OTHER): Payer: BLUE CROSS/BLUE SHIELD | Admitting: General Surgery

## 2014-06-21 VITALS — BP 138/80 | HR 94 | Resp 14 | Ht 66.0 in | Wt 231.0 lb

## 2014-06-21 DIAGNOSIS — N6092 Unspecified benign mammary dysplasia of left breast: Secondary | ICD-10-CM

## 2014-06-21 DIAGNOSIS — N62 Hypertrophy of breast: Secondary | ICD-10-CM

## 2014-06-21 DIAGNOSIS — C50912 Malignant neoplasm of unspecified site of left female breast: Secondary | ICD-10-CM

## 2014-06-21 NOTE — Patient Instructions (Signed)
Continue drain management

## 2014-06-21 NOTE — Progress Notes (Signed)
This is a 54 year old female here today for postoperative visit, bilateral mastectomy on 05-23-14. She states she feels better. She states her drainage has been 50-60 ml on both sides. drainagwe is mildly cloudy but not purulent. Both incisions are healing well.  Oncotype dx still pending. Follow up in 1 week.

## 2014-06-23 ENCOUNTER — Other Ambulatory Visit: Payer: Self-pay | Admitting: Family Medicine

## 2014-06-24 NOTE — Telephone Encounter (Signed)
Px written for call in   

## 2014-06-24 NOTE — Telephone Encounter (Signed)
Electronic refill request, last refilled on 02/18/14 #120 with 3 additional refills, pt has f/u scheduled on 08/01/14

## 2014-06-24 NOTE — Telephone Encounter (Signed)
Rx called in as prescribed 

## 2014-06-27 ENCOUNTER — Telehealth: Payer: Self-pay | Admitting: *Deleted

## 2014-06-27 NOTE — Telephone Encounter (Signed)
Called patient back to let her know that Dr.Sankar still wants to see her on Wednesday 06/29/14 for her follow up

## 2014-06-27 NOTE — Telephone Encounter (Signed)
Patient called and stated that the right drain came out on Friday, and on 06/25/14 she pulled the left drain out. She stated there was not much draining. She wanted to know does she need to keep her appointment on Wednesday or not

## 2014-06-29 ENCOUNTER — Ambulatory Visit (INDEPENDENT_AMBULATORY_CARE_PROVIDER_SITE_OTHER): Payer: BLUE CROSS/BLUE SHIELD | Admitting: General Surgery

## 2014-06-29 ENCOUNTER — Encounter: Payer: Self-pay | Admitting: General Surgery

## 2014-06-29 VITALS — BP 128/74 | HR 76 | Resp 14 | Ht 67.0 in | Wt 234.0 lb

## 2014-06-29 DIAGNOSIS — C50912 Malignant neoplasm of unspecified site of left female breast: Secondary | ICD-10-CM

## 2014-06-29 DIAGNOSIS — N6092 Unspecified benign mammary dysplasia of left breast: Secondary | ICD-10-CM

## 2014-06-29 DIAGNOSIS — N62 Hypertrophy of breast: Secondary | ICD-10-CM

## 2014-06-29 NOTE — Progress Notes (Signed)
This is a 54 year old female here today for postoperative visit, bilateral mastectomy on 05-23-14. Patient states the right drain came out on Friday(06/24/14) and on 06/25/14 she pulled the left drain out. She states the areas are doing well.   Exam: mastectomy sites healing well. Moderate sized seroma noted at right mastectomy site. No seroma noted at left mastectomy site. Aspirated fluid from seroma at right mastectomy site; clear yellow fluid; about 170m aspirated.  Reviewed Mammoprint that showed low risk  Plan: Recheck in 1 week. Appointment with Dr. COliva Bustardon 07/13/2014.  Follow up 1 week

## 2014-06-29 NOTE — Patient Instructions (Signed)
Follow up in 1 week

## 2014-07-01 ENCOUNTER — Encounter: Payer: Self-pay | Admitting: Internal Medicine

## 2014-07-01 ENCOUNTER — Ambulatory Visit (INDEPENDENT_AMBULATORY_CARE_PROVIDER_SITE_OTHER): Payer: BLUE CROSS/BLUE SHIELD | Admitting: Internal Medicine

## 2014-07-01 VITALS — BP 126/74 | HR 78 | Ht 66.0 in | Wt 232.0 lb

## 2014-07-01 DIAGNOSIS — K51919 Ulcerative colitis, unspecified with unspecified complications: Secondary | ICD-10-CM

## 2014-07-01 DIAGNOSIS — D122 Benign neoplasm of ascending colon: Secondary | ICD-10-CM | POA: Diagnosis not present

## 2014-07-01 MED ORDER — LINACLOTIDE 290 MCG PO CAPS: 290.0000 ug | ORAL_CAPSULE | Freq: Every day | ORAL | Status: DC

## 2014-07-01 MED ORDER — MESALAMINE 1.2 G PO TBEC: 1.2000 g | DELAYED_RELEASE_TABLET | Freq: Every day | ORAL | Status: DC

## 2014-07-01 NOTE — Patient Instructions (Addendum)
We have sent medications to your pharmacy for pick. DR Glori Bickers, Dr Hulda Marin

## 2014-07-01 NOTE — Progress Notes (Signed)
Lindsay Fry Jul 04, 1960 [id number]  Note: This dictation was prepared with Dragon digital system. Any transcriptional errors that result from this procedure are unintentional.   History of Present Illness: This is a 54 year old white female with ulcerative pancolitis since 63. Family history of colon cancer in her father. Crohn's disease in her mother. She has chronic constipation and colonic inertia requiring rigorous bowel regimen. Prior colonoscopies in 2006 in 2008 as well as in December 2012 showed tubular adenomas and low-grade dysplasia. The last colonoscopy in February 2016 showed the 3 adenomatous polyps, normal mucosa in the ascending , transverse and descending colon. With mild activity in sigmoid colon. There was no dysplasia. The exam was suboptimal because of  suboptimal prep and poor visualization of the proximal colon. Cecal  pouch was never visualized. She came today to discuss further disposition. She was just diagnosed with breast cancer in the last month and has undergone bilateral mastectomy.    Past Medical History  Diagnosis Date  . Rosacea   . Family history of malignant neoplasm of gastrointestinal tract   . Allergic rhinitis, cause unspecified   . Anxiety state, unspecified   . Unspecified asthma(493.90)   . Benign neoplasm of other and unspecified site of the digestive system   . Ulcerative colitis, unspecified   . Depression   . Fibromyalgia   . GERD (gastroesophageal reflux disease)   . Hiatal hernia   . Pure hypercholesterolemia   . Unspecified essential hypertension   . Unspecified hypothyroidism   . Insomnia, unspecified   . History of migraines   . Other screening mammogram   . Asymptomatic postmenopausal status (age-related) (natural)   . Unspecified vitamin D deficiency   . Edema   . Dysuria   . History of ovarian cyst   . Cyst     on Achilles tendon  . Tubular adenoma of colon 01/10/11  . Vertigo   . Subacute confusional state 11/19/2012   . ADD (attention deficit disorder) 11/19/2012  . OCD (obsessive compulsive disorder)   . Glaucoma   . History of alcoholism   . Bipolar disorder   . Diabetes mellitus     frank  . Atypical hyperplasia of left breast 2000  . Headache     migraines  . Arthritis   . Cancer     Left Breast 57mm invasive CA left uiq, dcis left uoq and a lot ADH, ALH in both breasts.  . Carcinoma of left breast 06/02/2014    Past Surgical History  Procedure Laterality Date  . Wisdom tooth extraction  1980's  . Tonsillectomy  1984  . Cesarean section  1996/1997    placenta previa, gest DM, pre-eclampsia  . Nasal sinus surgery  05/1997  . Precancerous mole removed    . Cholecystectomy  1997    adhesions also  . Meckel diverticulum excision  1999  . Dexa  04/1999 and 2010    normal  . Colonoscopy  01/2001    Ulcerative colitis  . Esophagogastroduodenoscopy  09/2001    polyp  . Exercise stress test  11/2003    negative  . Nuclear stress test  06/2007    negative  . Colonoscopy  04/2004    UC, polyp  . Colonoscopy  12/08    UC, no polyps  . Sleep study  11/09    no apnea, but did snore (done by [facility])  . Bunionectomy Right   . Femur fracture surgery Left   . Appendectomy    .  Tubal ligation    . Simple mastectomy with axillary sentinel node biopsy Bilateral 05/23/2014    Procedure: Bilateral simple mastectomy, left sentinel node biopsy ;  Surgeon: Christene Lye, MD;  Location: [facility];  Service: General;  Laterality: Bilateral;  . Axillary sentinel node biopsy Left 05/23/2014    Procedure: AXILLARY SENTINEL NODE BIOPSY;  Surgeon: Christene Lye, MD;  Location: [facility];  Service: General;  Laterality: Left;  Marland Kitchen Mastectomy Bilateral 05-23-14    Dr. Jamal Collin  . Breast biopsy  Aug 2000    on tamoxifen, atypical hyperplasia  . Breast surgery Left Aug 2000    lumpectomy/ Dr Sharlet Salina  . Breast surgery Bilateral 05/23/14    Mastectomy    Allergies  Allergen Reactions  . Aspirin      REACTION: aggrivates colitis  . Crestor [Rosuvastatin]     REACTION: increased lfts  . Erythromycin     REACTION: GI upset  . Nsaids     REACTION: aggrivate colitis    Family history and social history have been reviewed.  Review of Systems: Denies any symptoms of colitis such as rectal bleeding, abdominal pain or diarrhea. She has been just diagnosed with breast cancer and is so post-bilateral mastectomy  The remainder of the 10 point ROS is negative except as outlined in the H&P  Physical Exam: General Appearance Well developed, in no distress, overweight Eyes  Non icteric  HEENT  Non traumatic, normocephalic  Mouth No lesion, tongue papillated, no cheilosis Neck Supple without adenopathy, thyroid not enlarged, no carotid bruits, no JVD Lungs Clear to auscultation bilaterally COR Normal S1, normal S2, regular rhythm, no murmur, quiet precordium Abdomen protuberant soft nontender Rectal not done Extremities  No pedal edema Skin No lesions Neurological Alert and oriented x 3 Psychological Normal mood and affect  Assessment and Plan:   54 year old white female with the ulcerative colitis of greater than 30 years duration. Has history of mild  dysplasia and adenomatous polyps. It's time for her to undergo total colectomy. He have discussed it more than once before. She just had at bilateral mastectomy for breast cancer and she really doesn't want to undergo colectomy at this time. She is aware of the risk of colon cancer 10 years having the disease. I also told her it is very difficult to prep her colon because of the tortuosity as well as hypomotility and therefore we could miss  small polyps , the quality of exam may affect the decision to proceed with total colectomy. She would like me to do another colonoscopy before I retire next year. She will do a 2 day prep.Hopefully , we will be able to get to the cecum and obtain biopsies. I have talked to her about possibility of ileo   proctostomy. Currently she is asymptomatic from her ulcerative colitis. The only activity being in sigmoid colon. I will start her on mesalamine 1.2 g daily. She wants to be followed by Dr. Henrene Pastor in the future    Delfin Edis 07/01/2014

## 2014-07-05 ENCOUNTER — Ambulatory Visit (INDEPENDENT_AMBULATORY_CARE_PROVIDER_SITE_OTHER): Payer: BLUE CROSS/BLUE SHIELD | Admitting: General Surgery

## 2014-07-05 ENCOUNTER — Encounter: Payer: Self-pay | Admitting: General Surgery

## 2014-07-05 VITALS — BP 142/82 | HR 102 | Resp 22 | Ht 66.0 in | Wt 229.0 lb

## 2014-07-05 DIAGNOSIS — C50912 Malignant neoplasm of unspecified site of left female breast: Secondary | ICD-10-CM

## 2014-07-05 DIAGNOSIS — N62 Hypertrophy of breast: Secondary | ICD-10-CM

## 2014-07-05 DIAGNOSIS — N6092 Unspecified benign mammary dysplasia of left breast: Secondary | ICD-10-CM

## 2014-07-05 NOTE — Progress Notes (Signed)
Patient ID: Lindsay Fry, female   DOB: 1960-02-21, 54 y.o.   MRN: [id number] Patient here to follow breast cancer. She had bilateral mastectomy on 05-23-14. Patient states she does have some drainage out of the left mastectomy site, she says it leaks all day, notices an odor. She wears a gauze pad over this. She is doing well.  Has an appointment with Dr. Oliva Bustard next week.   Exam: no erythema, induration, or other signs of infection at left mastectomy site. Small opening in the middle with no active drainage noted. Covered with gauze.  Small seroma at right mastectomy site, not as large as last week when 125 mL was aspirated. Aspirated 60 mL of clear, yellow fluid.   Follow up 1 week   PCP:  T[place], [place]

## 2014-07-05 NOTE — Patient Instructions (Signed)
Follow up in 1 week

## 2014-07-11 ENCOUNTER — Encounter: Payer: Self-pay | Admitting: General Surgery

## 2014-07-11 ENCOUNTER — Ambulatory Visit: Payer: BLUE CROSS/BLUE SHIELD | Admitting: General Surgery

## 2014-07-11 VITALS — BP 132/76 | HR 66 | Resp 14 | Ht 66.0 in | Wt 233.0 lb

## 2014-07-11 DIAGNOSIS — T8140XA Infection following a procedure, unspecified, initial encounter: Secondary | ICD-10-CM

## 2014-07-11 DIAGNOSIS — N6092 Unspecified benign mammary dysplasia of left breast: Secondary | ICD-10-CM

## 2014-07-11 DIAGNOSIS — C50912 Malignant neoplasm of unspecified site of left female breast: Secondary | ICD-10-CM

## 2014-07-11 MED ORDER — DOXYCYCLINE HYCLATE 100 MG PO CAPS: 100.0000 mg | ORAL_CAPSULE | Freq: Two times a day (BID) | ORAL | Status: DC

## 2014-07-11 NOTE — Patient Instructions (Signed)
The patient is aware to call back for any questions or concerns. Start doxycycline 100 mg twice daily. May change based on culture results.

## 2014-07-11 NOTE — Progress Notes (Signed)
Patient ID: Lindsay Fry, female   DOB: 18-Aug-1960, 54 y.o.   MRN: [id number]  Patient here to follow up breast cancer. She had bilateral mastectomy on 05-23-14. Patient states she has still draining some on the left mastectomy site keeps it covered with a dry gauze. She has an appointment with Dr. Oliva Bustard on 07/13/14. Right mastectomy-Fry eroma noted. Left mastectomy-brownish purulent material draining from 1cm opening in middle-appears to be coming from lateral pocket.  Culture of drainage done. Rx with doxycycline -adjust if needed after culture report. Recheck 2 weeks.   HDQ:[id number]

## 2014-07-13 ENCOUNTER — Inpatient Hospital Stay: Payer: BLUE CROSS/BLUE SHIELD | Attending: Oncology | Admitting: Oncology

## 2014-07-13 ENCOUNTER — Encounter: Payer: Self-pay | Admitting: Oncology

## 2014-07-13 VITALS — BP 136/85 | HR 109 | Temp 97.2°F | Wt 235.0 lb

## 2014-07-13 DIAGNOSIS — F319 Bipolar disorder, unspecified: Secondary | ICD-10-CM

## 2014-07-13 DIAGNOSIS — F329 Major depressive disorder, single episode, unspecified: Secondary | ICD-10-CM

## 2014-07-13 DIAGNOSIS — Z9013 Acquired absence of bilateral breasts and nipples: Secondary | ICD-10-CM

## 2014-07-13 DIAGNOSIS — E119 Type 2 diabetes mellitus without complications: Secondary | ICD-10-CM | POA: Diagnosis not present

## 2014-07-13 DIAGNOSIS — M797 Fibromyalgia: Secondary | ICD-10-CM | POA: Diagnosis not present

## 2014-07-13 DIAGNOSIS — Z79899 Other long term (current) drug therapy: Secondary | ICD-10-CM | POA: Insufficient documentation

## 2014-07-13 DIAGNOSIS — Z7982 Long term (current) use of aspirin: Secondary | ICD-10-CM | POA: Diagnosis not present

## 2014-07-13 DIAGNOSIS — F419 Anxiety disorder, unspecified: Secondary | ICD-10-CM | POA: Diagnosis not present

## 2014-07-13 DIAGNOSIS — K219 Gastro-esophageal reflux disease without esophagitis: Secondary | ICD-10-CM | POA: Diagnosis not present

## 2014-07-13 DIAGNOSIS — E559 Vitamin D deficiency, unspecified: Secondary | ICD-10-CM | POA: Diagnosis not present

## 2014-07-13 DIAGNOSIS — J45909 Unspecified asthma, uncomplicated: Secondary | ICD-10-CM | POA: Diagnosis not present

## 2014-07-13 DIAGNOSIS — Z800 Family history of malignant neoplasm of digestive organs: Secondary | ICD-10-CM

## 2014-07-13 DIAGNOSIS — K449 Diaphragmatic hernia without obstruction or gangrene: Secondary | ICD-10-CM | POA: Diagnosis not present

## 2014-07-13 DIAGNOSIS — Z803 Family history of malignant neoplasm of breast: Secondary | ICD-10-CM | POA: Diagnosis not present

## 2014-07-13 DIAGNOSIS — E669 Obesity, unspecified: Secondary | ICD-10-CM | POA: Diagnosis not present

## 2014-07-13 DIAGNOSIS — D0511 Intraductal carcinoma in situ of right breast: Secondary | ICD-10-CM | POA: Insufficient documentation

## 2014-07-13 DIAGNOSIS — G4700 Insomnia, unspecified: Secondary | ICD-10-CM | POA: Diagnosis not present

## 2014-07-13 DIAGNOSIS — Z79811 Long term (current) use of aromatase inhibitors: Secondary | ICD-10-CM | POA: Diagnosis not present

## 2014-07-13 DIAGNOSIS — F42 Obsessive-compulsive disorder: Secondary | ICD-10-CM | POA: Diagnosis not present

## 2014-07-13 DIAGNOSIS — E039 Hypothyroidism, unspecified: Secondary | ICD-10-CM | POA: Diagnosis not present

## 2014-07-13 DIAGNOSIS — C50912 Malignant neoplasm of unspecified site of left female breast: Secondary | ICD-10-CM | POA: Insufficient documentation

## 2014-07-13 MED ORDER — CALCIUM 600-200 MG-UNIT PO TABS: 1.0000 | ORAL_TABLET | Freq: Two times a day (BID) | ORAL | Status: DC

## 2014-07-13 MED ORDER — LETROZOLE 2.5 MG PO TABS: 2.5000 mg | ORAL_TABLET | Freq: Every day | ORAL | Status: DC

## 2014-07-13 NOTE — Progress Notes (Signed)
[place] @ [facility] [phone]  Fax:([phone]) [place]: 1960/09/07  MR#: [id number]  YYT#:[id number]  Patient Care Team: Abner Greenspan, MD as PCP - General Elayne Snare, MD as Consulting Physician (Endocrinology) Chucky May, MD as Consulting Physician (Psychiatry) Rosina Lowenstein, MD (Obstetrics and Gynecology) Christene Lye, MD (General Surgery)  CHIEF COMPLAINT:  Chief Complaint  Patient presents with  . Follow-up    Oncology History   1.  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  Left breast: T1 b N0 M0 stage IB estrogen and progesterone receptor HER-2/neu Not overexpressed 2.    Multi-gene analysis with  MAMOPRINT low risk for recurrent disease Patient was started on letrozole and calcium and vitamin D (June, 2016)     Carcinoma of left breast   05/23/2014 Initial Diagnosis Carcinoma of left breast    Oncology Flowsheet 05/23/2014  dexamethasone (DECADRON) IJ -  ondansetron (ZOFRAN) IV -    INTERVAL HISTORY: 54 year old lady with a history of carcinoma breast status post bilateral mastectomy.  Patient is here for ongoing evaluation and treatment consideration. Multigene analysis was low risk. Patient is here to discuss possibility of anti-hormonal therapy  REVIEW OF SYSTEMS:   Moderately obese lady in no acute distress GENERAL:  Feels good.  Active.  No fevers, sweats or weight loss. PERFORMANCE STATUS (ECOG): 01 HEENT:  No visual changes, runny nose, sore throat,  mouth sores or tenderness. Lungs: No shortness of breath or cough.  No hemoptysis. Cardiac:  No chest pain, palpitations, orthopnea, or PND. GI:  No nausea, vomiting, diarrhea, constipation, melena or hematochezia. GU:  No urgency, frequency, dysuria, or hematuria. Musculoskeletal:  No back pain.  No joint pain.  No muscle tenderness. Extremities:  No pain or swelling. Skin:  No rashes or skin changes. Neuro:  No headache, numbness or weakness, balance or coordination issues. Endocrine:  No diabetes, thyroid issues, hot flashes or night sweats. Psych:  No mood changes, depression or anxiety. Pain:  No focal pain. Review of systems:  All other systems reviewed and found to be negative. As per HPI. Otherwise, a complete review of systems is negatve.  PAST MEDICAL HISTORY: Past Medical History  Diagnosis Date  . Rosacea   . Family history of malignant neoplasm of gastrointestinal tract   . Allergic rhinitis, cause unspecified   . Anxiety state, unspecified   . Unspecified asthma(493.90)   . Benign neoplasm of other and unspecified site of the digestive system   . Ulcerative colitis, unspecified   . Depression   . Fibromyalgia   . GERD (gastroesophageal reflux disease)   . Hiatal hernia   . Pure hypercholesterolemia   . Unspecified essential hypertension   . Unspecified hypothyroidism   . Insomnia, unspecified   . History of migraines   . Other screening mammogram   . Asymptomatic postmenopausal status (age-related) (natural)   . Unspecified vitamin D deficiency   . Edema   . Dysuria   . History of ovarian cyst   . Cyst  on Achilles tendon  . Tubular adenoma of colon 01/10/11  . Vertigo   . Subacute confusional state 11/19/2012  . ADD (attention deficit disorder) 11/19/2012  . OCD (obsessive compulsive disorder)   . Glaucoma   . History of alcoholism   . Bipolar disorder   . Diabetes mellitus     frank  . Atypical hyperplasia of left breast 2000  . Headache      migraines  . Arthritis   . Cancer     Left Breast 79mm invasive CA left uiq, dcis left uoq and a lot ADH, ALH in both breasts.  . Carcinoma of left breast 06/02/2014    PAST SURGICAL HISTORY: Past Surgical History  Procedure Laterality Date  . Wisdom tooth extraction  1980's  . Tonsillectomy  1984  . Cesarean section  1996/1997    placenta previa, gest DM, pre-eclampsia  . Nasal sinus surgery  05/1997  . Precancerous mole removed    . Cholecystectomy  1997    adhesions also  . Meckel diverticulum excision  1999  . Dexa  04/1999 and 2010    normal  . Colonoscopy  01/2001    Ulcerative colitis  . Esophagogastroduodenoscopy  09/2001    polyp  . Exercise stress test  11/2003    negative  . Nuclear stress test  06/2007    negative  . Colonoscopy  04/2004    UC, polyp  . Colonoscopy  12/08    UC, no polyps  . Sleep study  11/09    no apnea, but did snore (done by [facility])  . Bunionectomy Right   . Femur fracture surgery Left   . Appendectomy    . Tubal ligation    . Simple mastectomy with axillary sentinel node biopsy Bilateral 05/23/2014    Procedure: Bilateral simple mastectomy, left sentinel node biopsy ;  Surgeon: Christene Lye, MD;  Location: [facility] ORS;  Service: General;  Laterality: Bilateral;  . Axillary sentinel node biopsy Left 05/23/2014    Procedure: AXILLARY SENTINEL NODE BIOPSY;  Surgeon: Christene Lye, MD;  Location: [facility] ORS;  Service: General;  Laterality: Left;  Marland Kitchen Mastectomy Bilateral 05-23-14    Dr. Jamal Collin  . Breast biopsy  Aug 2000    on tamoxifen, atypical hyperplasia  . Breast surgery Left Aug 2000    lumpectomy/ Dr Sharlet Salina  . Breast surgery Bilateral 05/23/14    Mastectomy    FAMILY HISTORY Family History  Problem Relation Age of Onset  . Coronary artery disease Father   . Colon cancer Father   . Alzheimer's disease Father   . Dementia Father   . Coronary artery disease Mother   . Hypertension Mother   . Osteoporosis Mother   . Colitis  Mother   . Crohn's disease Mother   . Breast cancer      great aunts  . Coronary artery disease      Uncle (also AAA)  . Diabetes      remote family history  . Stroke Cousin   . Esophageal cancer Neg Hx   . Rectal cancer Neg Hx   . Stomach cancer Neg Hx     ADVANCED DIRECTIVES:  No flowsheet data found.  HEALTH MAINTENANCE: History  Substance Use Topics  . Smoking status: Former Smoker -- 0.25 packs/day for 5 years    Types: Cigarettes    Quit date: 01/14/1993  . Smokeless tobacco: Never Used  . Alcohol Use: No     Comment: Recovered ETOH  Allergies  Allergen Reactions  . Aspirin     REACTION: aggrivates colitis  . Crestor [Rosuvastatin]     REACTION: increased lfts  . Erythromycin     REACTION: GI upset  . Nsaids     REACTION: aggrivate colitis    Current Outpatient Prescriptions  Medication Sig Dispense Refill  . acetaminophen (TYLENOL ARTHRITIS PAIN) 650 MG CR tablet Take 1,300 mg by mouth 2 (two) times daily as needed.     Marland Kitchen amLODipine (NORVASC) 10 MG tablet TAKE ONE TABLET BY MOUTH EVERY NIGHT AT BEDTIME 90 tablet 0  . amphetamine-dextroamphetamine (ADDERALL) 30 MG tablet Take 30 mg by mouth 2 (two) times daily.    . Asenapine Maleate (SAPHRIS) 10 MG SUBL Place 20 mg under the tongue at bedtime.    Marland Kitchen aspirin 81 MG tablet Take 81 mg by mouth daily.    Marland Kitchen atorvastatin (LIPITOR) 40 MG tablet TAKE ONE-HALF TABLET BY MOUTH EVERY DAY 45 tablet 0  . b complex vitamins capsule Take 1 capsule by mouth daily.    Marland Kitchen buPROPion (WELLBUTRIN XL) 150 MG 24 hr tablet Take 450 mg by mouth daily.     . chlorproMAZINE (THORAZINE) 25 MG tablet Take 25-50 mg by mouth as needed. For migraines if frova doesn't help.    . Cholecalciferol (VITAMIN D3) 2000 UNITS TABS Take 1 tablet by mouth daily.    . cloNIDine (CATAPRES) 0.1 MG tablet TAKE 1 TABLET BY MOUTH TWICE DAILY 60 tablet 2  . co-enzyme Q-10 50 MG capsule Take 50 mg by mouth daily.    . cyclobenzaprine (FLEXERIL) 10 MG  tablet TAKE 2 TABLETS BY MOUTH EVERY NIGHT AT BEDTIME 180 tablet 1  . diazepam (VALIUM) 10 MG tablet Take 2.5-10 mg by mouth as needed for anxiety.    . diphenhydrAMINE (BENADRYL) 50 MG tablet Take 50 mg by mouth at bedtime as needed (anxiety).     Marland Kitchen doxycycline (VIBRAMYCIN) 100 MG capsule Take 1 capsule (100 mg total) by mouth 2 (two) times daily. 10 capsule 1  . fluticasone (FLONASE) 50 MCG/ACT nasal spray USE 2 SPRAYS IN EACH NOSTRIL TWICE DAILY. 16 g 11  . glucose blood (ONETOUCH VERIO) test strip Use as instructed to check blood sugar twice a day 100 each 3  . lamoTRIgine (LAMICTAL) 200 MG tablet Take 400 mg by mouth daily.    Marland Kitchen latanoprost (XALATAN) 0.005 % ophthalmic solution Place 1 drop into both eyes At bedtime.    . Linaclotide (LINZESS) 290 MCG CAPS capsule Take 1 capsule (290 mcg total) by mouth daily. 30 capsule 6  . Liniments (SALONPAS PAIN RELIEF PATCH EX) Apply topically as needed.    . mesalamine (LIALDA) 1.2 G EC tablet Take 1 tablet (1.2 g total) by mouth daily with breakfast. 30 tablet 6  . Multiple Vitamin (MULTIVITAMIN) tablet Take 1 tablet by mouth daily.      . nitrofurantoin (MACRODANTIN) 100 MG capsule Take 100 mg by mouth as directed. With sexual activity     . Omega-3 Krill Oil 500 MG CAPS Take 500 mg by mouth.    . pantoprazole (PROTONIX) 40 MG tablet TAKE 1 TABLET BY MOUTH TWICE DAILY 180 tablet 1  . Probiotic Product (PROBIOTIC DAILY PO) Take by mouth.    . sennosides-docusate sodium (SENOKOT-S) 8.6-50 MG tablet Take 5 tablets by mouth at bedtime.    . Simethicone (GAS-X PO) Take 1 tablet by mouth 4 (four) times daily.    . SitaGLIPtin-MetFORMIN HCl [phone] MG TB24 1 at  dinner daily 30 tablet 2  . spironolactone (ALDACTONE) 25 MG tablet Take 25 mg by mouth 2 (two) times daily.      Marland Kitchen SYNTHROID 137 MCG tablet Take 274 mcg by mouth daily before breakfast.   3  . tiZANidine (ZANAFLEX) 4 MG tablet Take 4 mg by mouth. Take 1 by mouth as needed for mirgaine, may repeat  after 2 hours if no relief    . traMADol (ULTRAM) 50 MG tablet TAKE 1 TABLET BY MOUTH FOUR TIMES DAILY AS NEEDED 120 tablet 3  . valACYclovir (VALTREX) 500 MG tablet Take 500 mg by mouth 2 (two) times daily.      . vitamin C (ASCORBIC ACID) 500 MG tablet Take 250 mg by mouth daily. Takes 2 a day    . zolpidem (AMBIEN) 10 MG tablet Take 10 mg by mouth at bedtime.    . Calcium 600-200 MG-UNIT per tablet Take 1 tablet by mouth 2 (two) times daily. 60 tablet 6  . letrozole (FEMARA) 2.5 MG tablet Take 1 tablet (2.5 mg total) by mouth daily. 30 tablet 6   No current facility-administered medications for this visit.    OBJECTIVE:  Filed Vitals:   07/13/14 1134  BP: 136/85  Pulse: 109  Temp: 97.2 F (36.2 C)     Body mass index is 37.95 kg/(m^2).    ECOG FS:1 - Symptomatic but completely ambulatory  PHYSICAL EXAM: GENERAL:  Well developed, well nourished, sitting comfortably in the exam room in no acute distress. MENTAL STATUS:  Alert and oriented to person, place and time. HEAD:    Normocephalic, atraumatic, face symmetric, no Cushingoid features. EYES:  .  Pupils equal round and reactive to light and accomodation.  No conjunctivitis or scleral icterus. ENT:  Oropharynx clear without lesion.  Tongue normal. Mucous membranes moist.  RESPIRATORY:  Clear to auscultation without rales, wheezes or rhonchi. CARDIOVASCULAR:  Regular rate and rhythm without murmur, rub or gallop. BREAST:  Patient had bilateral mastectomy ABDOMEN:  Soft, non-tender, with active bowel sounds, and no hepatosplenomegaly.  No masses. BACK:  No CVA tenderness.  No tenderness on percussion of the back or rib cage. SKIN:  No rashes, ulcers or lesions. EXTREMITIES: No edema, no skin discoloration or tenderness.  No palpable cords. LYMPH NODES: No palpable cervical, supraclavicular, axillary or inguinal adenopathy  NEUROLOGICAL: Unremarkable. PSYCH:  Appropriate.   LAB RESULTS:  No visits with results within 2 Day(s)  from this visit. Latest known visit with results is:  Office Visit on 06/09/2014  Component Date Value Ref Range Status  . Anaerobic Culture 06/09/2014 Final report   Final  . Result 1 06/09/2014 Comment   Final   No anaerobic growth in 72 hours.  . Aerobic Culture 06/09/2014 Final report*  Final  . Result 1 06/09/2014 Staphylococcus aureus*  Final   Comment: Heavy growth Most isolates of Staphylococcus sp. produce a beta-lactamase enzyme rendering them resistant to penicillin. Please contact the laboratory if penicillin is being considered for therapy. Based on susceptibility to oxacillin this isolate would be susceptible to: * Beta-lactam/beta-lactamase inhibitor combinations; such as:     Amoxicillin-clavulanic acid     Ampicillin-sulbactam * Antistaphylococcal cephems; such as:     Cefaclor     Cefuroxime * Antistaphylococcal carbapenems; such as:     Imipenem     Meropenem   . Result 2 06/09/2014 Mixed skin flora   Final   Moderate growth  . Antimicrobial Susceptibility 06/09/2014 Comment   Final   Comment:       **  S = Susceptible; I = Intermediate; R = Resistant **                    P = Positive; N = Negative             MICS are expressed in micrograms per mL    Antibiotic                 RSLT#1    RSLT#2    RSLT#3    RSLT#4 Ciprofloxacin                  S Clindamycin                    S Erythromycin                   S Gentamicin                     S Levofloxacin                   S Linezolid                      S Moxifloxacin                   S Oxacillin                      S Quinupristin/Dalfopristin      S Rifampin                       S Tetracycline                   S Trimethoprim/Sulfa             S Vancomycin                     S       STUDIES: No results found.  ASSESSMENT: Cancer  of breast status post bilateral mastectomy Multi-gene analysis  low risk MEDICAL DECISION MAKING:  Start patient on letrozole.  Calcium and vitamin  D Risk for osteoporosis has been discussed Side effect with joint pain has been discussed Bone density study as a baseline study has been ordered Reevaluate patient in 6 month  Patient expressed understanding and was in agreement with this plan. She also understands that She can call clinic at any time with any questions, concerns, or complaints.    Carcinoma of left breast   Staging form: Breast, AJCC 7th Edition     Clinical: Stage IA (T1b, N0, M0) - Unsigned       Staging comments: Kidney frequent changes in the 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  No invasive cancer in the right breast.  Status post bilateral mastectomy    Forest Gleason, MD   07/13/2014 8:47 PM

## 2014-07-13 NOTE — Progress Notes (Signed)
Patient does not have living will.  Former smoker. 

## 2014-07-15 LAB — ANAEROBIC AND AEROBIC CULTURE

## 2014-07-20 ENCOUNTER — Ambulatory Visit
Admission: RE | Admit: 2014-07-20 | Discharge: 2014-07-20 | Disposition: A | Discharge status: Home / Self Care | Payer: BLUE CROSS/BLUE SHIELD | Source: Ambulatory Visit | Attending: Oncology | Admitting: Oncology

## 2014-07-20 ENCOUNTER — Telehealth: Payer: Self-pay | Admitting: *Deleted

## 2014-07-20 ENCOUNTER — Other Ambulatory Visit: Payer: Self-pay | Admitting: Endocrinology

## 2014-07-20 DIAGNOSIS — C50912 Malignant neoplasm of unspecified site of left female breast: Secondary | ICD-10-CM

## 2014-07-20 DIAGNOSIS — Z853 Personal history of malignant neoplasm of breast: Secondary | ICD-10-CM | POA: Diagnosis present

## 2014-07-20 DIAGNOSIS — M810 Age-related osteoporosis without current pathological fracture: Secondary | ICD-10-CM | POA: Diagnosis not present

## 2014-07-20 DIAGNOSIS — Z13820 Encounter for screening for osteoporosis: Secondary | ICD-10-CM | POA: Diagnosis present

## 2014-07-20 MED ORDER — CIPROFLOXACIN HCL 500 MG PO TABS: 500.0000 mg | ORAL_TABLET | Freq: Two times a day (BID) | ORAL | Status: DC

## 2014-07-20 NOTE — Telephone Encounter (Signed)
Patient called back in and was notified as instructed. She verbalizes understanding.

## 2014-07-20 NOTE — Telephone Encounter (Signed)
Left message RX sent

## 2014-07-20 NOTE — Telephone Encounter (Signed)
-----   Message from Christene Lye, MD sent at 07/20/2014  8:46 AM EDT ----- Need to stop Doxycycline. Send Rx for Cipro 500mg  bid for 10days

## 2014-07-22 ENCOUNTER — Encounter: Payer: Self-pay | Admitting: Family Medicine

## 2014-07-22 ENCOUNTER — Ambulatory Visit (INDEPENDENT_AMBULATORY_CARE_PROVIDER_SITE_OTHER): Payer: BLUE CROSS/BLUE SHIELD | Admitting: Family Medicine

## 2014-07-22 VITALS — BP 142/82 | HR 110 | Temp 98.6°F | Ht 66.0 in | Wt 234.8 lb

## 2014-07-22 DIAGNOSIS — E669 Obesity, unspecified: Secondary | ICD-10-CM | POA: Diagnosis not present

## 2014-07-22 DIAGNOSIS — M797 Fibromyalgia: Secondary | ICD-10-CM | POA: Diagnosis not present

## 2014-07-22 DIAGNOSIS — I10 Essential (primary) hypertension: Secondary | ICD-10-CM | POA: Diagnosis not present

## 2014-07-22 MED ORDER — GABAPENTIN 100 MG PO CAPS: 100.0000 mg | ORAL_CAPSULE | Freq: Three times a day (TID) | ORAL | Status: DC

## 2014-07-22 NOTE — Progress Notes (Signed)
Pre visit review using our clinic review tool, if applicable. No additional management support is needed unless otherwise documented below in the visit note. 

## 2014-07-22 NOTE — Patient Instructions (Signed)
Start gabapentin 100 mg at bedtime for a week and then twice daily for a week and then three times daily  Normal early side effects include feeling dizzy or drowsy but that usually goes away pretty quickly  If this causes mood change or increased depression stop it   We can titrate this up depending on how you feel (so update me in a month)   Stay as active as you can

## 2014-07-22 NOTE — Progress Notes (Signed)
Subjective:    Patient ID: Lindsay Fry, female    DOB: March 18, 1960, 54 y.o.   MRN: [id number]  HPI Here for f/u of chronic problems   Has been a busy year  Had a double mastectomy for breast cancer (stage 1 no mets)  Is on femara - and main side eff is pain in the joints  Hard to work on the treadmill  Is on tramadol and tylenol arthritis (with the fibromyalgia - is still not controlled)  Also colon polyps - started back on lialda (colitis)- is not interested in colectomy yet though it was suggested  On cipro for skin infx for her mastectomy- that wound is getting   Is interested in a pain clinic or trial of gabapentin   Had a pap smear and was HPV pos  Was not sure if lab error  The pap itself was nl  Will repeat in a year   Wt is down 1 lb  bmi of 37 Had lost more during the mastectomy -then gained some due to not being as active (wants to be able to exercise)  She does not like water exercise  May be interested in yoga   BP Readings from Last 3 Encounters:  07/22/14 142/82  07/13/14 136/85  07/11/14 132/76   Patient Active Problem List   Diagnosis Date Noted  . 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 Carcinoma of left breast 06/02/2014  . Sinus tachycardia 04/27/2014  . Obesity 05/20/2013  . Bladder pain 02/17/2013  . Subacute confusional state 11/19/2012  . ADD (attention deficit disorder) 11/19/2012  . Chest pain 08/05/2012  . Urinary frequency 08/05/2012  . Low back pain 08/05/2012  . 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 Chronic sinusitis 01/24/2012  . Vertigo, benign positional 01/24/2012  . Other screening mammogram 12/05/2010  . Routine general medical examination at a health care facility 10/14/2010  . FATIGUE 08/09/2009  . POSTMENOPAUSAL STATUS 06/22/2008  . UNSPECIFIED VITAMIN D DEFICIENCY 03/18/2008  . BENIGN NEOPLASM [place] SITE DIGESTIVE SYSTEM 01/09/2007  . HIATAL HERNIA 01/09/2007  . COLONIC POLYPS, ADENOMATOUS,  HX OF 01/09/2007  . HYPERCHOLESTEROLEMIA 01/07/2007  . Bipolar disorder, unspecified 01/07/2007  . DEPRESSION 01/07/2007  . Essential hypertension 01/07/2007  . ALLERGIC RHINITIS 01/07/2007  . ASTHMA 01/07/2007  . GERD 01/07/2007  . COLITIS, ULCERATIVE 01/07/2007  . ACNE ROSACEA 01/07/2007  . MIGRAINES, HX OF 01/07/2007  . Fibromyalgia 10/30/2006  . INSOMNIA 10/30/2006   Past Medical History  Diagnosis Date  . Rosacea   . Family history of malignant neoplasm of gastrointestinal tract   . Allergic rhinitis, cause unspecified   . Anxiety state, unspecified   . Unspecified asthma(493.90)   . Benign neoplasm of other and unspecified site of the digestive system   . Ulcerative colitis, unspecified   . Depression   . Fibromyalgia   . GERD (gastroesophageal reflux disease)   . Hiatal hernia   . Pure hypercholesterolemia   . Unspecified essential hypertension   . Unspecified hypothyroidism   . Insomnia, unspecified   . History of migraines   . Other screening mammogram   . Asymptomatic postmenopausal status (age-related) (natural)   . Unspecified vitamin D deficiency   . Edema   . Dysuria   . History of ovarian cyst   . Cyst     on Achilles tendon  . Tubular adenoma of colon 01/10/11  . Vertigo   . Subacute confusional state 11/19/2012  . ADD (attention deficit disorder) 11/19/2012  .  OCD (obsessive compulsive disorder)   . Glaucoma   . History of alcoholism   . Bipolar disorder   . Diabetes mellitus     frank  . Atypical hyperplasia of left breast 2000  . Headache     migraines  . Arthritis   . Cancer     Left Breast 59m invasive CA left uiq, dcis left uoq and a lot ADH, ALH in both breasts.  . Carcinoma of left breast 06/02/2014   Past Surgical History  Procedure Laterality Date  . Wisdom tooth extraction  1980's  . Tonsillectomy  1984  . Cesarean section  1996/1997    placenta previa, gest DM, pre-eclampsia  . Nasal sinus surgery  05/1997  . Precancerous mole  removed    . Cholecystectomy  1997    adhesions also  . Meckel diverticulum excision  1999  . Dexa  04/1999 and 2010    normal  . Colonoscopy  01/2001    Ulcerative colitis  . Esophagogastroduodenoscopy  09/2001    polyp  . Exercise stress test  11/2003    negative  . Nuclear stress test  06/2007    negative  . Colonoscopy  04/2004    UC, polyp  . Colonoscopy  12/08    UC, no polyps  . Sleep study  11/09    no apnea, but did snore (done by [facility])  . Bunionectomy Right   . Femur fracture surgery Left   . Appendectomy    . Tubal ligation    . Simple mastectomy with axillary sentinel node biopsy Bilateral 05/23/2014    Procedure: Bilateral simple mastectomy, left sentinel node biopsy ;  Surgeon: SChristene Lye MD;  Location: [facility] ORS;  Service: General;  Laterality: Bilateral;  . Axillary sentinel node biopsy Left 05/23/2014    Procedure: AXILLARY SENTINEL NODE BIOPSY;  Surgeon: SChristene Lye MD;  Location: [facility] ORS;  Service: General;  Laterality: Left;  .Marland KitchenMastectomy Bilateral 05-23-14    Dr. SJamal Collin . Breast biopsy  Aug 2000    on tamoxifen, atypical hyperplasia  . Breast surgery Left Aug 2000    lumpectomy/ Dr CSharlet Salina . Breast surgery Bilateral 05/23/14    Mastectomy   History  Substance Use Topics  . Smoking status: Former Smoker -- 0.25 packs/day for 5 years    Types: Cigarettes    Quit date: 01/14/1993  . Smokeless tobacco: Never Used  . Alcohol Use: No     Comment: Recovered ETOH   Family History  Problem Relation Age of Onset  . Coronary artery disease Father   . Colon cancer Father   . Alzheimer's disease Father   . Dementia Father   . Coronary artery disease Mother   . Hypertension Mother   . Osteoporosis Mother   . Colitis Mother   . Crohn's disease Mother   . Breast cancer      great aunts  . Coronary artery disease      Uncle (also AAA)  . Diabetes      remote family history  . Stroke Cousin   . Esophageal cancer Neg Hx   . Rectal  cancer Neg Hx   . Stomach cancer Neg Hx    Allergies  Allergen Reactions  . Aspirin     REACTION: aggrivates colitis  . Crestor [Rosuvastatin]     REACTION: increased lfts  . Erythromycin     REACTION: GI upset  . Nsaids     REACTION: aggrivate colitis  Current Outpatient Prescriptions on File Prior to Visit  Medication Sig Dispense Refill  . acetaminophen (TYLENOL ARTHRITIS PAIN) 650 MG CR tablet Take 1,300 mg by mouth 2 (two) times daily as needed.     Marland Kitchen amLODipine (NORVASC) 10 MG tablet TAKE ONE TABLET BY MOUTH EVERY NIGHT AT BEDTIME 90 tablet 0  . amphetamine-dextroamphetamine (ADDERALL) 30 MG tablet Take 30 mg by mouth 2 (two) times daily.    . Asenapine Maleate (SAPHRIS) 10 MG SUBL Place 20 mg under the tongue at bedtime.    Marland Kitchen aspirin 81 MG tablet Take 81 mg by mouth daily.    Marland Kitchen atorvastatin (LIPITOR) 40 MG tablet TAKE ONE-HALF TABLET BY MOUTH EVERY DAY 45 tablet 0  . b complex vitamins capsule Take 1 capsule by mouth daily.    Marland Kitchen buPROPion (WELLBUTRIN XL) 150 MG 24 hr tablet Take 450 mg by mouth daily.     . Calcium 600-200 MG-UNIT per tablet Take 1 tablet by mouth 2 (two) times daily. 60 tablet 6  . chlorproMAZINE (THORAZINE) 25 MG tablet Take 25-50 mg by mouth as needed. For migraines if frova doesn't help.    . Cholecalciferol (VITAMIN D3) 2000 UNITS TABS Take 1 tablet by mouth daily.    . ciprofloxacin (CIPRO) 500 MG tablet Take 1 tablet (500 mg total) by mouth 2 (two) times daily. 20 tablet 0  . cloNIDine (CATAPRES) 0.1 MG tablet TAKE 1 TABLET BY MOUTH TWICE DAILY 60 tablet 2  . co-enzyme Q-10 50 MG capsule Take 50 mg by mouth daily.    . cyclobenzaprine (FLEXERIL) 10 MG tablet TAKE 2 TABLETS BY MOUTH EVERY NIGHT AT BEDTIME 180 tablet 1  . diazepam (VALIUM) 10 MG tablet Take 2.5-10 mg by mouth as needed for anxiety.    . diphenhydrAMINE (BENADRYL) 50 MG tablet Take 50 mg by mouth at bedtime as needed (anxiety).     . fluticasone (FLONASE) 50 MCG/ACT nasal spray USE 2  SPRAYS IN EACH NOSTRIL TWICE DAILY. 16 g 11  . glucose blood (ONETOUCH VERIO) test strip Use as instructed to check blood sugar twice a day 100 each 3  . JANUMET XR [id number] MG TB24 TAKE 1 TABLET BY MOUTH DAILY AT DINNER 30 tablet 3  . lamoTRIgine (LAMICTAL) 200 MG tablet Take 400 mg by mouth daily.    Marland Kitchen latanoprost (XALATAN) 0.005 % ophthalmic solution Place 1 drop into both eyes At bedtime.    Marland Kitchen letrozole (FEMARA) 2.5 MG tablet Take 1 tablet (2.5 mg total) by mouth daily. 30 tablet 6  . Linaclotide (LINZESS) 290 MCG CAPS capsule Take 1 capsule (290 mcg total) by mouth daily. 30 capsule 6  . Liniments (SALONPAS PAIN RELIEF PATCH EX) Apply topically as needed.    . mesalamine (LIALDA) 1.2 G EC tablet Take 1 tablet (1.2 g total) by mouth daily with breakfast. 30 tablet 6  . Multiple Vitamin (MULTIVITAMIN) tablet Take 1 tablet by mouth daily.      . nitrofurantoin (MACRODANTIN) 100 MG capsule Take 100 mg by mouth as directed. With sexual activity     . Omega-3 Krill Oil 500 MG CAPS Take 500 mg by mouth.    . pantoprazole (PROTONIX) 40 MG tablet TAKE 1 TABLET BY MOUTH TWICE DAILY 180 tablet 1  . Probiotic Product (PROBIOTIC DAILY PO) Take by mouth.    . sennosides-docusate sodium (SENOKOT-S) 8.6-50 MG tablet Take 5 tablets by mouth at bedtime.    . Simethicone (GAS-X PO) Take 1 tablet by mouth 4 (four)  times daily.    Marland Kitchen spironolactone (ALDACTONE) 25 MG tablet Take 25 mg by mouth 2 (two) times daily.      Marland Kitchen SYNTHROID 137 MCG tablet Take 274 mcg by mouth daily before breakfast.   3  . tiZANidine (ZANAFLEX) 4 MG tablet Take 4 mg by mouth. Take 1 by mouth as needed for mirgaine, may repeat after 2 hours if no relief    . traMADol (ULTRAM) 50 MG tablet TAKE 1 TABLET BY MOUTH FOUR TIMES DAILY AS NEEDED 120 tablet 3  . valACYclovir (VALTREX) 500 MG tablet Take 500 mg by mouth 2 (two) times daily.      . vitamin C (ASCORBIC ACID) 500 MG tablet Take 250 mg by mouth daily. Takes 2 a day    . zolpidem  (AMBIEN) 10 MG tablet Take 10 mg by mouth at bedtime.     No current facility-administered medications on file prior to visit.       Review of Systems Review of Systems  Constitutional: Negative for fever, appetite change, and unexpected weight change. pos for fatigue  Eyes: Negative for pain and visual disturbance.  Respiratory: Negative for cough and shortness of breath.   Cardiovascular: Negative for cp or palpitations    Gastrointestinal: Negative for nausea, diarrhea and constipation.  Genitourinary: Negative for urgency and frequency.  Skin: Negative for pallor or rash   MSK pos for widespread body pain from fibromyalgia and also femara  Neurological: Negative for weakness, light-headedness, numbness and headaches.  Hematological: Negative for adenopathy. Does not bruise/bleed easily.  Psychiatric/Behavioral: Negative for dysphoric mood. The patient is not nervous/anxious.         Objective:   Physical Exam  Constitutional: She appears well-developed and well-nourished. No distress.  obese and well appearing   HENT:  Head: Normocephalic and atraumatic.  Mouth/Throat: Oropharynx is clear and moist.  Eyes: Conjunctivae and EOM are normal. Pupils are equal, round, and reactive to light.  Neck: Normal range of motion. Neck supple. No JVD present. Carotid bruit is not present. No thyromegaly present.  Cardiovascular: Normal rate, regular rhythm, normal heart sounds and intact distal pulses.  Exam reveals no gallop.   Pulmonary/Chest: Effort normal and breath sounds normal. No respiratory distress. She has no wheezes. She has no rales.  No crackles  Abdominal: Soft. Bowel sounds are normal. She exhibits no distension, no abdominal bruit and no mass. There is no tenderness.  Musculoskeletal: She exhibits tenderness. She exhibits no edema.  Diffuse myofascial tenderness with some trigger points No acute joing changes   Lymphadenopathy:    She has no cervical adenopathy.    Neurological: She is alert. She has normal reflexes.  Skin: Skin is warm and dry. No rash noted.  Psychiatric: She has a normal mood and affect.  Nursing note and vitals reviewed.         Assessment & Plan:   Problem List Items Addressed This Visit    Essential hypertension - Primary    bp in fair control at this time  BP Readings from Last 1 Encounters:  07/22/14 142/82   No changes needed Disc lifstyle change with low sodium diet and exercise  Enc wt loss      Fibromyalgia    Pt is having inc sensitivity to body aches caused by femara for breast cancer  Disc opt for tx - she would like to not depend on tramadol  Given px for gabapentin 100 to titrate up to tid (Discussed expectations of this medication  including time to effectiveness and mechanism of action, also poss of side effects (early and late)- including mental fuzziness, weight or appetite change, nausea and poss of worse dep or anxiety (even suicidal thoughts)  Pt voiced understanding and will stop med and update if this occurs   If helpful and tolerated -can titrate up further to 300 and poss 600  Will update in 1 mo or earlier if needed and go from there  Hopefully she will also adj to the femara       Obesity    Discussed how this problem influences overall health and the risks it imposes  Reviewed plan for weight loss with lower calorie diet (via better food choices and also portion control or program like weight watchers) and exercise building up to or more than 30 minutes 5 days per week including some aerobic activity   Pt did loose and gain some back - less able to exercise now , and recent mastectomy  Now that she is healed and we are working on pain control, she will be able to get back on the treadmill

## 2014-07-23 NOTE — Assessment & Plan Note (Signed)
Pt is having inc sensitivity to body aches caused by femara for breast cancer  Disc opt for tx - she would like to not depend on tramadol  Given px for gabapentin 100 to titrate up to tid (Discussed expectations of this medication including time to effectiveness and mechanism of action, also poss of side effects (early and late)- including mental fuzziness, weight or appetite change, nausea and poss of worse dep or anxiety (even suicidal thoughts)  Pt voiced understanding and will stop med and update if this occurs   If helpful and tolerated -can titrate up further to 300 and poss 600  Will update in 1 mo or earlier if needed and go from there  Hopefully she will also adj to the [facility]

## 2014-07-23 NOTE — Assessment & Plan Note (Signed)
bp in fair control at this time  BP Readings from Last 1 Encounters:  07/22/14 142/82   No changes needed Disc lifstyle change with low sodium diet and exercise  Enc wt loss

## 2014-07-23 NOTE — Assessment & Plan Note (Signed)
Discussed how this problem influences overall health and the risks it imposes  Reviewed plan for weight loss with lower calorie diet (via better food choices and also portion control or program like weight watchers) and exercise building up to or more than 30 minutes 5 days per week including some aerobic activity   Pt did loose and gain some back - less able to exercise now , and recent mastectomy  Now that she is healed and we are working on pain control, she will be able to get back on the treadmill

## 2014-07-26 ENCOUNTER — Ambulatory Visit (INDEPENDENT_AMBULATORY_CARE_PROVIDER_SITE_OTHER): Payer: BLUE CROSS/BLUE SHIELD | Admitting: General Surgery

## 2014-07-26 ENCOUNTER — Encounter: Payer: Self-pay | Admitting: General Surgery

## 2014-07-26 DIAGNOSIS — C50912 Malignant neoplasm of unspecified site of left female breast: Secondary | ICD-10-CM

## 2014-07-26 NOTE — Patient Instructions (Addendum)
Patient to return in four months. Continue self breast exams. Call office for any new breast issues or concerns.

## 2014-07-26 NOTE — Progress Notes (Signed)
Patient here to follow up breast cancer. She had bilateral mastectomy on 05-23-14. Patient states the left mastectomy wound is not draining no more. She states she has got a sinus infection and is on Cipro. Left breast mastectomy site is fully healed. No seroma or signs of infection Referral to plastic surgery for breast reconstruction.  Patient to return in four months.

## 2014-07-27 ENCOUNTER — Other Ambulatory Visit (INDEPENDENT_AMBULATORY_CARE_PROVIDER_SITE_OTHER): Payer: BLUE CROSS/BLUE SHIELD

## 2014-07-27 ENCOUNTER — Encounter: Payer: Self-pay | Admitting: *Deleted

## 2014-07-27 DIAGNOSIS — E890 Postprocedural hypothyroidism: Secondary | ICD-10-CM

## 2014-07-27 LAB — T4, FREE: Free T4: 1.18 ng/dL (ref 0.60–1.60)

## 2014-07-27 LAB — TSH: TSH: 3.09 u[IU]/mL (ref 0.35–4.50)

## 2014-07-27 NOTE — Progress Notes (Signed)
Patient ID: Lindsay Fry, female   DOB: [birth year], 54 y.o.   MRN: [id number]  Please note: Patient's plastic surgeon is Dr. Cindy Hazy.  Contact info as follows:   Phone: [phone] Fax: [phone]   Mailing address: [address]  [place], [place] [address]

## 2014-08-01 ENCOUNTER — Ambulatory Visit: Payer: BLUE CROSS/BLUE SHIELD | Admitting: Family Medicine

## 2014-08-05 ENCOUNTER — Ambulatory Visit: Payer: BLUE CROSS/BLUE SHIELD | Admitting: Endocrinology

## 2014-08-09 ENCOUNTER — Telehealth: Payer: Self-pay | Admitting: General Surgery

## 2014-08-09 NOTE — Telephone Encounter (Signed)
PT CALLED & SHE WOULD LIKE YOU TO SCHEDULE HER  A REFERRAL TO  DR Thomasenia Sales  A PLASTIC SURGEON IN [place] & [phone]. PLEASE CALL PT WITH THE APPT & IF SHE DOESN'T ANSWER L/M.

## 2014-08-09 NOTE — Telephone Encounter (Signed)
Have any records been faxed?

## 2014-08-10 ENCOUNTER — Telehealth: Payer: Self-pay | Admitting: *Deleted

## 2014-08-10 NOTE — Telephone Encounter (Signed)
We received a call back from [place] at Dr. Harrington Challenger office stating that they do not see for breast reconstruction.  Message left for patient on cell phone regarding this information.  Need to know how she would like to proceed from here.

## 2014-08-10 NOTE — Telephone Encounter (Signed)
Office contacted but said patient would need to call and make an appointment.   Records faxed to [phone].  Message left on patient's cell that she would need to call and schedule an appointment.

## 2014-08-12 ENCOUNTER — Telehealth: Payer: Self-pay | Admitting: Endocrinology

## 2014-08-12 NOTE — Telephone Encounter (Signed)
I informed the patient that she does not need to have labs done before her visit.

## 2014-08-12 NOTE — Telephone Encounter (Signed)
Patient would like to know if she need lab work before her appointment. Please advise

## 2014-08-15 ENCOUNTER — Telehealth: Payer: Self-pay

## 2014-08-15 MED ORDER — GABAPENTIN 300 MG PO CAPS: 300.0000 mg | ORAL_CAPSULE | Freq: Three times a day (TID) | ORAL | Status: DC

## 2014-08-15 NOTE — Telephone Encounter (Signed)
Patient advised.

## 2014-08-15 NOTE — Telephone Encounter (Signed)
Currently on gabapentin 100 mg tid (confirm) Please inc to 100 am 100 lunch 300 pm for 1 week Then 100 am, 300 lunch, 300 pm for 1 week Then if doing ok 300 tid  Go slow, if too dizzy or sedated do this more slowly  Let me know how it does  I will send to [facility] in [place]

## 2014-08-15 NOTE — Telephone Encounter (Signed)
Pt left v/m; presently pt is taking gabapentin 300 mg daily; to control pts pain level she is taking tramadol as well as gabapentin.  Pt has spoken with her psychiatrist, Dr Buddy Duty and pt was told OK to increase the gabapentin. Pt request Dr Marliss Coots opinion if OK to increase gabapentin or does pt need appt. Pt request cb. Pt last seen 07/22/2014. [facility]

## 2014-08-18 ENCOUNTER — Ambulatory Visit (INDEPENDENT_AMBULATORY_CARE_PROVIDER_SITE_OTHER): Payer: BLUE CROSS/BLUE SHIELD | Admitting: Endocrinology

## 2014-08-18 ENCOUNTER — Encounter: Payer: Self-pay | Admitting: Endocrinology

## 2014-08-18 VITALS — BP 126/76 | HR 103 | Temp 97.7°F | Resp 16 | Ht 66.0 in | Wt 234.0 lb

## 2014-08-18 DIAGNOSIS — E890 Postprocedural hypothyroidism: Secondary | ICD-10-CM | POA: Diagnosis not present

## 2014-08-18 DIAGNOSIS — E119 Type 2 diabetes mellitus without complications: Secondary | ICD-10-CM

## 2014-08-18 NOTE — Progress Notes (Signed)
Patient ID: Lindsay Fry, female   DOB: 1960/02/11, 54 y.o.   MRN: [id number]   Reason for Appointment:  Hypothyroidism and diabetes, followup visit  History of Present Illness:  HYPOTHYROIDISM: This was first diagnosed in 1992 after her treatment for Graves' disease with I-131 She has been on relatively large doses of thyroxine supplements She was on 224 mcg since 11/13 and her TSH was normal in 3/14 Subjectively difficult to assess her thyroid since she tends to have fatigue chronically.  In 2014 her dose was reduced to 200 mcg daily and her dose has been fluctuating since then She has generally required periodic increase in dosage including in 05/2014  She is now on 2 tablets of 137 g daily, continues on the brand name Synthroid  Her symptoms are variable and usually nonspecific for hypothyroidism, mostly related to other medical problems including sleep disturbances and bipolar illness.   Since increasing her dose  by 25 g in 2/16 she thinks she has had less fatigue She has been quite regular with the Synthroid in the mornings without any interfering substances         TSH is back to normal   Lab Results  Component Value Date   TSH 3.09 07/27/2014   TSH 20.09* 06/07/2014   TSH 1.21 04/14/2014   FREET4 1.18 07/27/2014   FREET4 0.99 02/17/2014   FREET4 1.02 07/29/2013    DIABETES: Because of her high A1c of 6.7 she had a glucose tolerance test and this was significantly abnormal with fasting glucose 164 and 1 hour reading of 300.  She was started on metformin; however even with 1500 mg of metformin ER her blood sugars were not controlled especially fasting She is now taking Janumet XR 100/1000 daily She also is trying to improve diet with reducing carbohydrates and calories Walking 4-5 times a week now for 30min Has not been able to lose weight recently however  Did not bring her monitor for download today Recent glucose levels: Range 117-158, usually not  high after meals  Weight history:  Wt Readings from Last 3 Encounters:  08/18/14 234 lb (106.142 kg)  07/26/14 234 lb (106.142 kg)  07/22/14 234 lb 12 oz (106.482 kg)   LABS:  Lab Results  Component Value Date   HGBA1C 5.4 06/07/2014   HGBA1C 6.7* 02/17/2014   HGBA1C 6.2 07/29/2013   Lab Results  Component Value Date   LDLCALC 112* 05/12/2013   CREATININE 0.82 06/07/2014    No visits with results within 1 Week(s) from this visit. Latest known visit with results is:  Lab on 07/27/2014  Component Date Value Ref Range Status  . TSH 07/27/2014 3.09  0.35 - 4.50 uIU/mL Final  . Free T4 07/27/2014 1.18  0.60 - 1.60 ng/dL Final      Medication List       This list is accurate as of: 08/18/14 11:59 PM.  Always use your most recent med list.               amLODipine 10 MG tablet  Commonly known as:  NORVASC  TAKE ONE TABLET BY MOUTH EVERY NIGHT AT BEDTIME     amphetamine-dextroamphetamine 30 MG tablet  Commonly known as:  ADDERALL  Take 30 mg by mouth 2 (two) times daily.     aspirin 81 MG tablet  Take 81 mg by mouth daily.     atorvastatin 40 MG tablet  Commonly known as:  LIPITOR  TAKE ONE-HALF TABLET BY  MOUTH EVERY DAY     b complex vitamins capsule  Take 1 capsule by mouth daily.     buPROPion 150 MG 24 hr tablet  Commonly known as:  WELLBUTRIN XL  Take 450 mg by mouth daily.     Calcium 600-200 MG-UNIT per tablet  Take 1 tablet by mouth 2 (two) times daily.     chlorproMAZINE 25 MG tablet  Commonly known as:  THORAZINE  Take 25-50 mg by mouth as needed. For migraines if frova doesn't help.     ciprofloxacin 500 MG tablet  Commonly known as:  CIPRO  Take 1 tablet (500 mg total) by mouth 2 (two) times daily.     cloNIDine 0.1 MG tablet  Commonly known as:  CATAPRES  TAKE 1 TABLET BY MOUTH TWICE DAILY     co-enzyme Q-10 50 MG capsule  Take 50 mg by mouth daily.     cyclobenzaprine 10 MG tablet  Commonly known as:  FLEXERIL  TAKE 2 TABLETS BY  MOUTH EVERY NIGHT AT BEDTIME     diazepam 10 MG tablet  Commonly known as:  VALIUM  Take 2.5-10 mg by mouth as needed for anxiety.     diphenhydrAMINE 50 MG tablet  Commonly known as:  BENADRYL  Take 50 mg by mouth at bedtime as needed (anxiety).     fluticasone 50 MCG/ACT nasal spray  Commonly known as:  FLONASE  USE 2 SPRAYS IN EACH NOSTRIL TWICE DAILY.     gabapentin 100 MG capsule  Commonly known as:  NEURONTIN  Take 100 mg by mouth 2 (two) times daily.     gabapentin 300 MG capsule  Commonly known as:  NEURONTIN  Take 1 capsule (300 mg total) by mouth 3 (three) times daily.     GAS-X PO  Take 1 tablet by mouth 4 (four) times daily.     glucose blood test strip  Commonly known as:  ONETOUCH VERIO  Use as instructed to check blood sugar twice a day     JANUMET XR [phone] MG Tb24  Generic drug:  SitaGLIPtin-MetFORMIN HCl  TAKE 1 TABLET BY MOUTH DAILY AT DINNER     lamoTRIgine 200 MG tablet  Commonly known as:  LAMICTAL  Take 400 mg by mouth daily.     latanoprost 0.005 % ophthalmic solution  Commonly known as:  XALATAN  Place 1 drop into both eyes At bedtime.     letrozole 2.5 MG tablet  Commonly known as:  FEMARA  Take 1 tablet (2.5 mg total) by mouth daily.     Linaclotide 290 MCG Caps capsule  Commonly known as:  LINZESS  Take 1 capsule (290 mcg total) by mouth daily.     mesalamine 1.2 G EC tablet  Commonly known as:  LIALDA  Take 1 tablet (1.2 g total) by mouth daily with breakfast.     multivitamin tablet  Take 1 tablet by mouth daily.     nitrofurantoin 100 MG capsule  Commonly known as:  MACRODANTIN  Take 100 mg by mouth as directed. With sexual activity     Omega-3 Krill Oil 500 MG Caps  Take 500 mg by mouth.     pantoprazole 40 MG tablet  Commonly known as:  PROTONIX  TAKE 1 TABLET BY MOUTH TWICE DAILY     PROBIOTIC DAILY PO  Take by mouth.     SALONPAS PAIN RELIEF PATCH EX  Apply topically as needed.     SAPHRIS 10 MG Subl  Generic drug:  Asenapine Maleate  Place 20 mg under the tongue at bedtime.     sennosides-docusate sodium 8.6-50 MG tablet  Commonly known as:  SENOKOT-S  Take 5 tablets by mouth at bedtime.     spironolactone 25 MG tablet  Commonly known as:  ALDACTONE  Take 25 mg by mouth 2 (two) times daily.     SYNTHROID 137 MCG tablet  Generic drug:  levothyroxine  Take 274 mcg by mouth daily before breakfast.     tiZANidine 4 MG tablet  Commonly known as:  ZANAFLEX  Take 4 mg by mouth. Take 1 by mouth as needed for mirgaine, may repeat after 2 hours if no relief     traMADol 50 MG tablet  Commonly known as:  ULTRAM  2 (two) times daily.     TYLENOL ARTHRITIS PAIN 650 MG CR tablet  Generic drug:  acetaminophen  Take 1,300 mg by mouth 2 (two) times daily as needed.     valACYclovir 500 MG tablet  Commonly known as:  VALTREX  Take 500 mg by mouth 2 (two) times daily.     vitamin C 500 MG tablet  Commonly known as:  ASCORBIC ACID  Take 250 mg by mouth daily. Takes 2 a day     Vitamin D3 2000 UNITS Tabs  Take 1 tablet by mouth daily.     zolpidem 10 MG tablet  Commonly known as:  AMBIEN  Take 10 mg by mouth at bedtime.        Allergies:  Allergies  Allergen Reactions  . Aspirin     REACTION: aggrivates colitis  . Crestor [Rosuvastatin]     REACTION: increased lfts  . Erythromycin     REACTION: GI upset  . Nsaids     REACTION: aggrivate colitis    Past Medical History  Diagnosis Date  . Rosacea   . Family history of malignant neoplasm of gastrointestinal tract   . Allergic rhinitis, cause unspecified   . Anxiety state, unspecified   . Unspecified asthma(493.90)   . Benign neoplasm of other and unspecified site of the digestive system   . Ulcerative colitis, unspecified   . Depression   . Fibromyalgia   . GERD (gastroesophageal reflux disease)   . Hiatal hernia   . Pure hypercholesterolemia   . Unspecified essential hypertension   . Unspecified hypothyroidism    . Insomnia, unspecified   . History of migraines   . Other screening mammogram   . Asymptomatic postmenopausal status (age-related) (natural)   . Unspecified vitamin D deficiency   . Edema   . Dysuria   . History of ovarian cyst   . Cyst     on Achilles tendon  . Tubular adenoma of colon 01/10/11  . Vertigo   . Subacute confusional state 11/19/2012  . ADD (attention deficit disorder) 11/19/2012  . OCD (obsessive compulsive disorder)   . Glaucoma   . History of alcoholism   . Bipolar disorder   . Diabetes mellitus     frank  . Atypical hyperplasia of left breast 2000  . Headache     migraines  . Arthritis   . Cancer     Left Breast 39mm invasive CA left uiq, dcis left uoq and a lot ADH, ALH in both breasts.  . Carcinoma of left breast 06/02/2014    Past Surgical History  Procedure Laterality Date  . Wisdom tooth extraction  1980's  . Tonsillectomy  1984  . Cesarean section  1996/1997  placenta previa, gest DM, pre-eclampsia  . Nasal sinus surgery  05/1997  . Precancerous mole removed    . Cholecystectomy  1997    adhesions also  . Meckel diverticulum excision  1999  . Dexa  04/1999 and 2010    normal  . Colonoscopy  01/2001    Ulcerative colitis  . Esophagogastroduodenoscopy  09/2001    polyp  . Exercise stress test  11/2003    negative  . Nuclear stress test  06/2007    negative  . Colonoscopy  04/2004    UC, polyp  . Colonoscopy  12/08    UC, no polyps  . Sleep study  11/09    no apnea, but did snore (done by [facility])  . Bunionectomy Right   . Femur fracture surgery Left   . Appendectomy    . Tubal ligation    . Simple mastectomy with axillary sentinel node biopsy Bilateral 05/23/2014    Procedure: Bilateral simple mastectomy, left sentinel node biopsy ;  Surgeon: Christene Lye, MD;  Location: [facility] ORS;  Service: General;  Laterality: Bilateral;  . Axillary sentinel node biopsy Left 05/23/2014    Procedure: AXILLARY SENTINEL NODE BIOPSY;  Surgeon:  Christene Lye, MD;  Location: [facility] ORS;  Service: General;  Laterality: Left;  Marland Kitchen Mastectomy Bilateral 05-23-14    Dr. Jamal Collin  . Breast biopsy  Aug 2000    on tamoxifen, atypical hyperplasia  . Breast surgery Left Aug 2000    lumpectomy/ Dr Sharlet Salina  . Breast surgery Bilateral 05/23/14    Mastectomy    Family History  Problem Relation Age of Onset  . Coronary artery disease Father   . Colon cancer Father   . Alzheimer's disease Father   . Dementia Father   . Coronary artery disease Mother   . Hypertension Mother   . Osteoporosis Mother   . Colitis Mother   . Crohn's disease Mother   . Breast cancer      great aunts  . Coronary artery disease      Uncle (also AAA)  . Diabetes      remote family history  . Stroke Cousin   . Esophageal cancer Neg Hx   . Rectal cancer Neg Hx   . Stomach cancer Neg Hx     Social History:  reports that she quit smoking about 21 years ago. Her smoking use included Cigarettes. She has a 1.25 pack-year smoking history. She has never used smokeless tobacco. She reports that she does not drink alcohol or use illicit drugs.  REVIEW Of SYSTEMS:   She is taking Aldactone for acne not for blood pressure   Examination:   BP 126/76 mmHg  Pulse 103  Temp(Src) 97.7 F (36.5 C)  Resp 16  Ht 5\' 6"  (1.676 m)  Wt 234 lb (106.142 kg)  BMI 37.79 kg/m2  SpO2 95%  LMP 08/23/2006 (Approximate)  She looks welll    Assessments   Hypothyroidism, post ablative:  She has had long-standing hypothyroidism and has needed variable doses  With progressive increasing her dose to 2 tablets of 137 her TSH is back to normal She is also feeling less fatigued She is quite compliant with taking the brand name Synthroid and morning  She will follow-up in 6 months  DIABETES: She has mild diabetes and is tolerating Janumet XR which appears to be doing better than metformin alone Baseline A1c was 6.7 and it is now consistently upper normal She did not bring her  monitor for download but she thinks her blood sugars are mostly near normal She is generally watching her diet and has also started walking  Macaulay Reicher 08/19/2014, 10:05 AM

## 2014-08-29 ENCOUNTER — Other Ambulatory Visit: Payer: Self-pay | Admitting: Family Medicine

## 2014-09-02 ENCOUNTER — Telehealth: Payer: Self-pay

## 2014-09-02 ENCOUNTER — Other Ambulatory Visit: Payer: Self-pay | Admitting: Family Medicine

## 2014-09-02 NOTE — Telephone Encounter (Signed)
Left detailed voicemail letting pt know Dr. Marliss Coots recommendations

## 2014-09-02 NOTE — Telephone Encounter (Signed)
Pt left v/m; pt taking spironolactone 25 mg daily; pts feet and ankles are very swollen; keeping feet up does not reduce the swelling and pt thinks swelling is side effect of Letrozole from Dr Oliva Bustard. Pt wants to know if should increase the spironolactone. Not sure what physician prescribes the spironolactone. Unable to reach pt by phone to get additional info.[facility].

## 2014-09-02 NOTE — Telephone Encounter (Signed)
It may be a side effect of that medication -she should talk to her oncologist about that She is already on aldactone 25 mg bid - I would not increase that (at max dose I am comfortable with) Drink water/ avoid sodium/elevate feet  Support stockings may be helpful  Enc her to talk to her oncologist about that side eff

## 2014-09-02 NOTE — Telephone Encounter (Signed)
Pt left v/m returning call and request cb. 

## 2014-09-04 DIAGNOSIS — I219 Acute myocardial infarction, unspecified: Secondary | ICD-10-CM

## 2014-09-04 HISTORY — DX: Acute myocardial infarction, unspecified: I21.9

## 2014-09-05 ENCOUNTER — Emergency Department: Payer: BLUE CROSS/BLUE SHIELD

## 2014-09-05 ENCOUNTER — Inpatient Hospital Stay (HOSPITAL_COMMUNITY)
Admit: 2014-09-05 | Discharge: 2014-09-05 | Disposition: A | Discharge status: Home / Self Care | Payer: BLUE CROSS/BLUE SHIELD | Attending: Cardiovascular Disease | Admitting: Cardiovascular Disease

## 2014-09-05 ENCOUNTER — Inpatient Hospital Stay
Admission: EM | Admit: 2014-09-05 | Discharge: 2014-09-06 | DRG: 281 | Disposition: A | Discharge status: Home / Self Care | Payer: BLUE CROSS/BLUE SHIELD | Attending: Internal Medicine | Admitting: Internal Medicine

## 2014-09-05 ENCOUNTER — Encounter
Admission: EM | Disposition: A | Discharge status: Home / Self Care | Payer: Self-pay | Source: Home / Self Care | Attending: Internal Medicine

## 2014-09-05 DIAGNOSIS — M797 Fibromyalgia: Secondary | ICD-10-CM | POA: Diagnosis present

## 2014-09-05 DIAGNOSIS — I209 Angina pectoris, unspecified: Secondary | ICD-10-CM

## 2014-09-05 DIAGNOSIS — Z87891 Personal history of nicotine dependence: Secondary | ICD-10-CM

## 2014-09-05 DIAGNOSIS — Z820 Family history of epilepsy and other diseases of the nervous system: Secondary | ICD-10-CM | POA: Diagnosis not present

## 2014-09-05 DIAGNOSIS — E7800 Pure hypercholesterolemia, unspecified: Secondary | ICD-10-CM | POA: Diagnosis present

## 2014-09-05 DIAGNOSIS — Z803 Family history of malignant neoplasm of breast: Secondary | ICD-10-CM | POA: Diagnosis not present

## 2014-09-05 DIAGNOSIS — K5190 Ulcerative colitis, unspecified, without complications: Secondary | ICD-10-CM | POA: Diagnosis present

## 2014-09-05 DIAGNOSIS — Z79899 Other long term (current) drug therapy: Secondary | ICD-10-CM

## 2014-09-05 DIAGNOSIS — Z7982 Long term (current) use of aspirin: Secondary | ICD-10-CM | POA: Diagnosis not present

## 2014-09-05 DIAGNOSIS — I5032 Chronic diastolic (congestive) heart failure: Secondary | ICD-10-CM | POA: Diagnosis present

## 2014-09-05 DIAGNOSIS — M1990 Unspecified osteoarthritis, unspecified site: Secondary | ICD-10-CM | POA: Diagnosis present

## 2014-09-05 DIAGNOSIS — E119 Type 2 diabetes mellitus without complications: Secondary | ICD-10-CM

## 2014-09-05 DIAGNOSIS — F319 Bipolar disorder, unspecified: Secondary | ICD-10-CM | POA: Diagnosis present

## 2014-09-05 DIAGNOSIS — Z6837 Body mass index (BMI) 37.0-37.9, adult: Secondary | ICD-10-CM | POA: Diagnosis not present

## 2014-09-05 DIAGNOSIS — Z823 Family history of stroke: Secondary | ICD-10-CM

## 2014-09-05 DIAGNOSIS — Z780 Asymptomatic menopausal state: Secondary | ICD-10-CM | POA: Diagnosis not present

## 2014-09-05 DIAGNOSIS — Z8249 Family history of ischemic heart disease and other diseases of the circulatory system: Secondary | ICD-10-CM | POA: Diagnosis not present

## 2014-09-05 DIAGNOSIS — K146 Glossodynia: Secondary | ICD-10-CM | POA: Diagnosis present

## 2014-09-05 DIAGNOSIS — Z9049 Acquired absence of other specified parts of digestive tract: Secondary | ICD-10-CM | POA: Diagnosis present

## 2014-09-05 DIAGNOSIS — H409 Unspecified glaucoma: Secondary | ICD-10-CM | POA: Diagnosis present

## 2014-09-05 DIAGNOSIS — I214 Non-ST elevation (NSTEMI) myocardial infarction: Secondary | ICD-10-CM | POA: Diagnosis not present

## 2014-09-05 DIAGNOSIS — K219 Gastro-esophageal reflux disease without esophagitis: Secondary | ICD-10-CM | POA: Diagnosis present

## 2014-09-05 DIAGNOSIS — G4700 Insomnia, unspecified: Secondary | ICD-10-CM | POA: Diagnosis present

## 2014-09-05 DIAGNOSIS — Z833 Family history of diabetes mellitus: Secondary | ICD-10-CM

## 2014-09-05 DIAGNOSIS — Z9889 Other specified postprocedural states: Secondary | ICD-10-CM | POA: Diagnosis not present

## 2014-09-05 DIAGNOSIS — J45909 Unspecified asthma, uncomplicated: Secondary | ICD-10-CM | POA: Diagnosis present

## 2014-09-05 DIAGNOSIS — Z800 Family history of malignant neoplasm of digestive organs: Secondary | ICD-10-CM | POA: Diagnosis not present

## 2014-09-05 DIAGNOSIS — R079 Chest pain, unspecified: Secondary | ICD-10-CM | POA: Diagnosis not present

## 2014-09-05 DIAGNOSIS — I2510 Atherosclerotic heart disease of native coronary artery without angina pectoris: Secondary | ICD-10-CM | POA: Diagnosis present

## 2014-09-05 DIAGNOSIS — Z853 Personal history of malignant neoplasm of breast: Secondary | ICD-10-CM | POA: Diagnosis not present

## 2014-09-05 DIAGNOSIS — I10 Essential (primary) hypertension: Secondary | ICD-10-CM | POA: Diagnosis present

## 2014-09-05 DIAGNOSIS — F329 Major depressive disorder, single episode, unspecified: Secondary | ICD-10-CM | POA: Diagnosis present

## 2014-09-05 DIAGNOSIS — F411 Generalized anxiety disorder: Secondary | ICD-10-CM | POA: Diagnosis present

## 2014-09-05 DIAGNOSIS — Z9851 Tubal ligation status: Secondary | ICD-10-CM

## 2014-09-05 DIAGNOSIS — E039 Hypothyroidism, unspecified: Secondary | ICD-10-CM | POA: Diagnosis present

## 2014-09-05 DIAGNOSIS — Z9013 Acquired absence of bilateral breasts and nipples: Secondary | ICD-10-CM | POA: Diagnosis present

## 2014-09-05 DIAGNOSIS — E1159 Type 2 diabetes mellitus with other circulatory complications: Secondary | ICD-10-CM | POA: Diagnosis not present

## 2014-09-05 DIAGNOSIS — E785 Hyperlipidemia, unspecified: Secondary | ICD-10-CM | POA: Diagnosis present

## 2014-09-05 DIAGNOSIS — E1169 Type 2 diabetes mellitus with other specified complication: Secondary | ICD-10-CM | POA: Diagnosis present

## 2014-09-05 DIAGNOSIS — I25119 Atherosclerotic heart disease of native coronary artery with unspecified angina pectoris: Secondary | ICD-10-CM

## 2014-09-05 DIAGNOSIS — I25111 Atherosclerotic heart disease of native coronary artery with angina pectoris with documented spasm: Secondary | ICD-10-CM | POA: Diagnosis not present

## 2014-09-05 HISTORY — DX: Atherosclerotic heart disease of native coronary artery without angina pectoris: I25.10

## 2014-09-05 HISTORY — DX: Chronic diastolic (congestive) heart failure: I50.32

## 2014-09-05 HISTORY — PX: CARDIAC CATHETERIZATION: SHX172

## 2014-09-05 LAB — LIPASE, BLOOD: Lipase: 30 U/L (ref 22–51)

## 2014-09-05 LAB — CBC WITH DIFFERENTIAL/PLATELET
Basophils Absolute: 0.1 10*3/uL (ref 0–0.1)
Basophils Relative: 1 %
Eosinophils Absolute: 0.8 10*3/uL — ABNORMAL HIGH (ref 0–0.7)
Eosinophils Relative: 7 %
HCT: 39.6 % (ref 35.0–47.0)
Hemoglobin: 12.3 g/dL (ref 12.0–16.0)
Lymphocytes Relative: 32 %
Lymphs Abs: 3.4 10*3/uL (ref 1.0–3.6)
MCH: 22.9 pg — ABNORMAL LOW (ref 26.0–34.0)
MCHC: 31 g/dL — ABNORMAL LOW (ref 32.0–36.0)
MCV: 73.9 fL — ABNORMAL LOW (ref 80.0–100.0)
Monocytes Absolute: 1 10*3/uL — ABNORMAL HIGH (ref 0.2–0.9)
Monocytes Relative: 9 %
Neutro Abs: 5.4 10*3/uL (ref 1.4–6.5)
Neutrophils Relative %: 51 %
Platelets: 554 10*3/uL — ABNORMAL HIGH (ref 150–440)
RBC: 5.37 MIL/uL — ABNORMAL HIGH (ref 3.80–5.20)
RDW: 19.8 % — ABNORMAL HIGH (ref 11.5–14.5)
WBC: 10.7 10*3/uL (ref 3.6–11.0)

## 2014-09-05 LAB — GLUCOSE, CAPILLARY
Glucose-Capillary: 149 mg/dL — ABNORMAL HIGH (ref 65–99)
Glucose-Capillary: 135 mg/dL — ABNORMAL HIGH (ref 65–99)
Glucose-Capillary: 128 mg/dL — ABNORMAL HIGH (ref 65–99)

## 2014-09-05 LAB — COMPREHENSIVE METABOLIC PANEL
ALT: 47 U/L (ref 14–54)
AST: 51 U/L — ABNORMAL HIGH (ref 15–41)
Albumin: 4.2 g/dL (ref 3.5–5.0)
Alkaline Phosphatase: 86 U/L (ref 38–126)
Anion gap: 11 (ref 5–15)
BUN: 13 mg/dL (ref 6–20)
CO2: 26 mmol/L (ref 22–32)
Calcium: 9.9 mg/dL (ref 8.9–10.3)
Chloride: 102 mmol/L (ref 101–111)
Creatinine, Ser: 0.8 mg/dL (ref 0.44–1.00)
GFR calc Af Amer: >60 mL/min (ref >60)
GFR calc non Af Amer: >60 mL/min (ref >60)
Glucose, Bld: 168 mg/dL — ABNORMAL HIGH (ref 65–99)
Potassium: 3.5 mmol/L (ref 3.5–5.1)
Sodium: 139 mmol/L (ref 135–145)
Total Bilirubin: 0.1 mg/dL — ABNORMAL LOW (ref 0.3–1.2)
Total Protein: 7.2 g/dL (ref 6.5–8.1)

## 2014-09-05 LAB — APTT: aPTT: 24 seconds (ref 24–36)

## 2014-09-05 LAB — TROPONIN I
Troponin I: <0.03 ng/mL (ref <0.031)
Troponin I: 4.41 ng/mL — ABNORMAL HIGH (ref <0.031)
Troponin I: 16.35 ng/mL — ABNORMAL HIGH (ref <0.031)
Troponin I: <0.03 ng/mL (ref <0.031)

## 2014-09-05 SURGERY — LEFT HEART CATH AND CORONARY ANGIOGRAPHY
Anesthesia: Moderate Sedation

## 2014-09-05 MED ORDER — CLONIDINE HCL 0.1 MG PO TABS: 0.0500 mg | ORAL_TABLET | Freq: Two times a day (BID) | ORAL | Status: DC

## 2014-09-05 MED ORDER — FENTANYL CITRATE (PF) 100 MCG/2ML IJ SOLN
INTRAMUSCULAR | Status: DC | PRN
  Administered 2014-09-05 (×2): 50 ug via INTRAVENOUS

## 2014-09-05 MED ORDER — SODIUM CHLORIDE 0.9 % IJ SOLN
3.0000 mL | Freq: Two times a day (BID) | INTRAMUSCULAR | Status: DC
  Administered 2014-09-05 (×2): 3 mL via INTRAVENOUS

## 2014-09-05 MED ORDER — SODIUM CHLORIDE 0.9 % IV SOLN: 250.0000 mL | INTRAVENOUS | Status: DC | PRN

## 2014-09-05 MED ORDER — GI COCKTAIL ~~LOC~~
30.0000 mL | ORAL | Status: AC
  Administered 2014-09-05: 30 mL via ORAL
  Filled 2014-09-05: qty 30

## 2014-09-05 MED ORDER — HEPARIN SODIUM (PORCINE) 1000 UNIT/ML IJ SOLN
INTRAMUSCULAR | Status: AC
  Filled 2014-09-05: qty 1

## 2014-09-05 MED ORDER — ONDANSETRON HCL 4 MG PO TABS
4.0000 mg | ORAL_TABLET | Freq: Four times a day (QID) | ORAL | Status: DC | PRN
Start: 2014-09-05 — End: 2014-09-06

## 2014-09-05 MED ORDER — IOHEXOL 300 MG/ML  SOLN
INTRAMUSCULAR | Status: DC | PRN
  Administered 2014-09-05: 80 mL via INTRA_ARTERIAL
  Administered 2014-09-05: 30 mL via INTRA_ARTERIAL

## 2014-09-05 MED ORDER — NITROGLYCERIN 0.4 MG SL SUBL
0.4000 mg | SUBLINGUAL_TABLET | SUBLINGUAL | Status: DC | PRN
  Administered 2014-09-05: 0.4 mg via SUBLINGUAL
  Filled 2014-09-05: qty 1

## 2014-09-05 MED ORDER — ACETAMINOPHEN 650 MG RE SUPP: 650.0000 mg | Freq: Four times a day (QID) | RECTAL | Status: DC | PRN

## 2014-09-05 MED ORDER — ENOXAPARIN SODIUM 40 MG/0.4ML ~~LOC~~ SOLN: 40.0000 mg | SUBCUTANEOUS | Status: DC

## 2014-09-05 MED ORDER — LATANOPROST 0.005 % OP SOLN
1.0000 [drp] | Freq: Every day | OPHTHALMIC | Status: DC
  Administered 2014-09-05: 1 [drp] via OPHTHALMIC
  Filled 2014-09-05: qty 2.5

## 2014-09-05 MED ORDER — CALCIUM CARBONATE-VITAMIN D 500-200 MG-UNIT PO TABS
1.0000 | ORAL_TABLET | Freq: Two times a day (BID) | ORAL | Status: DC
  Administered 2014-09-05 (×2): 1 via ORAL
  Filled 2014-09-05 (×2): qty 1

## 2014-09-05 MED ORDER — MIDAZOLAM HCL 2 MG/2ML IJ SOLN
INTRAMUSCULAR | Status: AC
  Filled 2014-09-05: qty 2

## 2014-09-05 MED ORDER — SODIUM CHLORIDE 0.9 % IJ SOLN: 3.0000 mL | INTRAMUSCULAR | Status: DC | PRN

## 2014-09-05 MED ORDER — ZOLPIDEM TARTRATE 5 MG PO TABS
10.0000 mg | ORAL_TABLET | Freq: Every day | ORAL | Status: DC
  Administered 2014-09-05: 10 mg via ORAL
  Filled 2014-09-05: qty 2

## 2014-09-05 MED ORDER — LEVOTHYROXINE SODIUM 137 MCG PO TABS
274.0000 ug | ORAL_TABLET | Freq: Every day | ORAL | Status: DC
  Administered 2014-09-06: 274 ug via ORAL
  Filled 2014-09-05: qty 2

## 2014-09-05 MED ORDER — SODIUM CHLORIDE 0.9 % WEIGHT BASED INFUSION: 3.0000 mL/kg/h | INTRAVENOUS | Status: DC

## 2014-09-05 MED ORDER — HEPARIN SODIUM (PORCINE) 1000 UNIT/ML IJ SOLN
INTRAMUSCULAR | Status: DC | PRN
  Administered 2014-09-05: 5000 [IU] via INTRAVENOUS

## 2014-09-05 MED ORDER — MESALAMINE 1.2 G PO TBEC
1.2000 g | DELAYED_RELEASE_TABLET | Freq: Every day | ORAL | Status: DC
  Filled 2014-09-05 (×4): qty 1

## 2014-09-05 MED ORDER — AMPHETAMINE-DEXTROAMPHETAMINE 30 MG PO TABS: 30.0000 mg | ORAL_TABLET | Freq: Two times a day (BID) | ORAL | Status: DC

## 2014-09-05 MED ORDER — PNEUMOCOCCAL VAC POLYVALENT 25 MCG/0.5ML IJ INJ: 0.5000 mL | INJECTION | INTRAMUSCULAR | Status: DC

## 2014-09-05 MED ORDER — SENNOSIDES-DOCUSATE SODIUM 8.6-50 MG PO TABS
5.0000 | ORAL_TABLET | Freq: Every day | ORAL | Status: DC
  Administered 2014-09-05: 5 via ORAL
  Filled 2014-09-05: qty 5

## 2014-09-05 MED ORDER — B COMPLEX VITAMINS PO CAPS: 1.0000 | ORAL_CAPSULE | Freq: Every day | ORAL | Status: DC

## 2014-09-05 MED ORDER — VITAMIN D 1000 UNITS PO TABS
2000.0000 [IU] | ORAL_TABLET | Freq: Every day | ORAL | Status: DC
  Administered 2014-09-05: 2000 [IU] via ORAL
  Filled 2014-09-05: qty 2

## 2014-09-05 MED ORDER — ASENAPINE MALEATE 5 MG SL SUBL
20.0000 mg | SUBLINGUAL_TABLET | Freq: Every day | SUBLINGUAL | Status: DC
Start: 2014-09-05 — End: 2014-09-05

## 2014-09-05 MED ORDER — LETROZOLE 2.5 MG PO TABS
2.5000 mg | ORAL_TABLET | Freq: Every day | ORAL | Status: DC
  Filled 2014-09-05: qty 1

## 2014-09-05 MED ORDER — METFORMIN HCL ER 500 MG PO TB24
1000.0000 mg | ORAL_TABLET | Freq: Every day | ORAL | Status: DC
Start: 2014-09-05 — End: 2014-09-05

## 2014-09-05 MED ORDER — ACETAMINOPHEN 325 MG PO TABS: 650.0000 mg | ORAL_TABLET | Freq: Four times a day (QID) | ORAL | Status: DC | PRN

## 2014-09-05 MED ORDER — ATORVASTATIN CALCIUM 20 MG PO TABS: 80.0000 mg | ORAL_TABLET | Freq: Every day | ORAL | Status: DC

## 2014-09-05 MED ORDER — CO-ENZYME Q-10 50 MG PO CAPS: 50.0000 mg | ORAL_CAPSULE | Freq: Every day | ORAL | Status: DC

## 2014-09-05 MED ORDER — PANTOPRAZOLE SODIUM 40 MG PO TBEC
40.0000 mg | DELAYED_RELEASE_TABLET | Freq: Two times a day (BID) | ORAL | Status: DC
  Administered 2014-09-05: 40 mg via ORAL
  Filled 2014-09-05 (×2): qty 1

## 2014-09-05 MED ORDER — SODIUM CHLORIDE 0.9 % WEIGHT BASED INFUSION
1.0000 mL/kg/h | INTRAVENOUS | Status: AC
Start: 2014-09-05 — End: 2014-09-05

## 2014-09-05 MED ORDER — SPIRONOLACTONE 25 MG PO TABS
25.0000 mg | ORAL_TABLET | Freq: Two times a day (BID) | ORAL | Status: DC
  Administered 2014-09-05 (×2): 25 mg via ORAL
  Filled 2014-09-05 (×2): qty 1

## 2014-09-05 MED ORDER — ASENAPINE MALEATE 5 MG SL SUBL
20.0000 mg | SUBLINGUAL_TABLET | Freq: Every day | SUBLINGUAL | Status: DC
  Administered 2014-09-05: 20 mg via SUBLINGUAL

## 2014-09-05 MED ORDER — ASPIRIN 81 MG PO CHEW
162.0000 mg | CHEWABLE_TABLET | ORAL | Status: AC
  Administered 2014-09-05: 162 mg via ORAL
  Filled 2014-09-05: qty 2

## 2014-09-05 MED ORDER — INSULIN ASPART 100 UNIT/ML ~~LOC~~ SOLN
0.0000 [IU] | Freq: Three times a day (TID) | SUBCUTANEOUS | Status: DC
Start: 2014-09-05 — End: 2014-09-06
  Administered 2014-09-05 (×2): 1 [IU] via SUBCUTANEOUS
  Filled 2014-09-05 (×2): qty 1

## 2014-09-05 MED ORDER — VERAPAMIL HCL 2.5 MG/ML IV SOLN
INTRAVENOUS | Status: AC
  Filled 2014-09-05: qty 2

## 2014-09-05 MED ORDER — OMEGA-3 KRILL OIL 500 MG PO CAPS: 500.0000 mg | ORAL_CAPSULE | ORAL | Status: DC

## 2014-09-05 MED ORDER — LINAGLIPTIN 5 MG PO TABS
5.0000 mg | ORAL_TABLET | Freq: Every day | ORAL | Status: DC
  Administered 2014-09-06: 5 mg via ORAL
  Filled 2014-09-05 (×2): qty 1

## 2014-09-05 MED ORDER — TIZANIDINE HCL 4 MG PO TABS: 4.0000 mg | ORAL_TABLET | Freq: Two times a day (BID) | ORAL | Status: DC | PRN

## 2014-09-05 MED ORDER — ONDANSETRON HCL 4 MG/2ML IJ SOLN: 4.0000 mg | Freq: Four times a day (QID) | INTRAMUSCULAR | Status: DC | PRN

## 2014-09-05 MED ORDER — SITAGLIP PHOS-METFORMIN HCL ER 100-1000 MG PO TB24: 100.0000 mg | ORAL_TABLET | ORAL | Status: DC

## 2014-09-05 MED ORDER — TICAGRELOR 90 MG PO TABS
90.0000 mg | ORAL_TABLET | Freq: Two times a day (BID) | ORAL | Status: DC
  Administered 2014-09-05 (×2): 90 mg via ORAL
  Filled 2014-09-05 (×2): qty 1

## 2014-09-05 MED ORDER — CYCLOBENZAPRINE HCL 10 MG PO TABS
20.0000 mg | ORAL_TABLET | Freq: Every day | ORAL | Status: DC
  Administered 2014-09-05: 20 mg via ORAL
  Filled 2014-09-05: qty 2

## 2014-09-05 MED ORDER — MIDAZOLAM HCL 2 MG/2ML IJ SOLN
INTRAMUSCULAR | Status: DC | PRN
  Administered 2014-09-05 (×2): 1 mg via INTRAVENOUS

## 2014-09-05 MED ORDER — ASPIRIN EC 81 MG PO TBEC
81.0000 mg | DELAYED_RELEASE_TABLET | Freq: Every day | ORAL | Status: DC
  Filled 2014-09-05: qty 1

## 2014-09-05 MED ORDER — VALACYCLOVIR HCL 500 MG PO TABS
500.0000 mg | ORAL_TABLET | Freq: Two times a day (BID) | ORAL | Status: DC
  Administered 2014-09-05 (×2): 500 mg via ORAL
  Filled 2014-09-05 (×2): qty 1

## 2014-09-05 MED ORDER — FENTANYL CITRATE (PF) 100 MCG/2ML IJ SOLN
INTRAMUSCULAR | Status: AC
  Filled 2014-09-05: qty 2

## 2014-09-05 MED ORDER — VITAMIN C 500 MG PO TABS
250.0000 mg | ORAL_TABLET | Freq: Every day | ORAL | Status: DC
  Administered 2014-09-05: 250 mg via ORAL
  Filled 2014-09-05: qty 1

## 2014-09-05 MED ORDER — ADULT MULTIVITAMIN W/MINERALS CH: 1.0000 | ORAL_TABLET | Freq: Every day | ORAL | Status: DC

## 2014-09-05 MED ORDER — CHLORPROMAZINE HCL 25 MG PO TABS: 25.0000 mg | ORAL_TABLET | ORAL | Status: DC | PRN

## 2014-09-05 MED ORDER — DIPHENHYDRAMINE HCL 25 MG PO CAPS: 50.0000 mg | ORAL_CAPSULE | Freq: Every evening | ORAL | Status: DC | PRN

## 2014-09-05 MED ORDER — SODIUM CHLORIDE 0.9 % IJ SOLN: 3.0000 mL | Freq: Two times a day (BID) | INTRAMUSCULAR | Status: DC

## 2014-09-05 MED ORDER — ASPIRIN 81 MG PO CHEW: 81.0000 mg | CHEWABLE_TABLET | ORAL | Status: DC

## 2014-09-05 MED ORDER — MORPHINE SULFATE (PF) 2 MG/ML IV SOLN: 2.0000 mg | INTRAVENOUS | Status: DC | PRN

## 2014-09-05 MED ORDER — SODIUM CHLORIDE 0.9 % IV BOLUS (SEPSIS)
1000.0000 mL | Freq: Once | INTRAVENOUS | Status: AC
  Administered 2014-09-05: 1000 mL via INTRAVENOUS

## 2014-09-05 MED ORDER — SODIUM CHLORIDE 0.9 % IV SOLN
250.0000 mL | INTRAVENOUS | Status: DC | PRN
  Administered 2014-09-05: 1000 mL via INTRAVENOUS

## 2014-09-05 MED ORDER — BUPROPION HCL ER (XL) 300 MG PO TB24
450.0000 mg | ORAL_TABLET | Freq: Every day | ORAL | Status: DC
  Administered 2014-09-05: 450 mg via ORAL
  Filled 2014-09-05: qty 1

## 2014-09-05 MED ORDER — ASPIRIN EC 81 MG PO TBEC: 81.0000 mg | DELAYED_RELEASE_TABLET | Freq: Every day | ORAL | Status: DC

## 2014-09-05 MED ORDER — SACCHAROMYCES BOULARDII 250 MG PO CAPS
250.0000 mg | ORAL_CAPSULE | ORAL | Status: AC
  Administered 2014-09-05: 250 mg via ORAL
  Filled 2014-09-05: qty 1

## 2014-09-05 MED ORDER — HEPARIN (PORCINE) IN NACL 100-0.45 UNIT/ML-% IJ SOLN
1000.0000 [IU]/h | INTRAMUSCULAR | Status: DC
  Filled 2014-09-05 (×2): qty 250

## 2014-09-05 MED ORDER — METOPROLOL TARTRATE 25 MG PO TABS
12.5000 mg | ORAL_TABLET | Freq: Two times a day (BID) | ORAL | Status: DC
  Administered 2014-09-05: 12.5 mg via ORAL
  Filled 2014-09-05: qty 1

## 2014-09-05 MED ORDER — SODIUM CHLORIDE 0.9 % WEIGHT BASED INFUSION: 1.0000 mL/kg/h | INTRAVENOUS | Status: DC

## 2014-09-05 MED ORDER — FLUTICASONE PROPIONATE 50 MCG/ACT NA SUSP
2.0000 | Freq: Two times a day (BID) | NASAL | Status: DC
  Administered 2014-09-05 (×2): 2 via NASAL
  Filled 2014-09-05: qty 16

## 2014-09-05 MED ORDER — GABAPENTIN 300 MG PO CAPS
300.0000 mg | ORAL_CAPSULE | Freq: Three times a day (TID) | ORAL | Status: DC
  Administered 2014-09-05 (×3): 300 mg via ORAL
  Filled 2014-09-05 (×3): qty 1

## 2014-09-05 MED ORDER — HEPARIN BOLUS VIA INFUSION
4000.0000 [IU] | Freq: Once | INTRAVENOUS | Status: AC
  Administered 2014-09-05: 4000 [IU] via INTRAVENOUS
  Filled 2014-09-05: qty 4000

## 2014-09-05 MED ORDER — LAMOTRIGINE 200 MG PO TABS
400.0000 mg | ORAL_TABLET | Freq: Every day | ORAL | Status: DC
  Administered 2014-09-05: 400 mg via ORAL
  Filled 2014-09-05 (×2): qty 2

## 2014-09-05 MED ORDER — DIAZEPAM 5 MG PO TABS: 2.5000 mg | ORAL_TABLET | ORAL | Status: DC | PRN

## 2014-09-05 MED ORDER — LINACLOTIDE 290 MCG PO CAPS
290.0000 ug | ORAL_CAPSULE | Freq: Every day | ORAL | Status: DC
  Administered 2014-09-05: 290 ug via ORAL
  Filled 2014-09-05: qty 1

## 2014-09-05 MED ORDER — ACETAMINOPHEN 325 MG PO TABS: 650.0000 mg | ORAL_TABLET | ORAL | Status: DC | PRN

## 2014-09-05 MED ORDER — ATORVASTATIN CALCIUM 20 MG PO TABS: 20.0000 mg | ORAL_TABLET | Freq: Every day | ORAL | Status: DC

## 2014-09-05 MED ORDER — HEPARIN (PORCINE) IN NACL 2-0.9 UNIT/ML-% IJ SOLN
INTRAMUSCULAR | Status: AC
  Filled 2014-09-05: qty 1000

## 2014-09-05 SURGICAL SUPPLY — 12 items
CATH INFINITI 5 FR JL3.5 (CATHETERS) ×2 IMPLANT
CATH INFINITI 5FR ANG PIGTAIL (CATHETERS) ×2 IMPLANT
CATH INFINITI 5FR JL4 (CATHETERS) IMPLANT
CATH INFINITI JR4 5F (CATHETERS) IMPLANT
CATH OPTITORQUE JACKY 4.0 5F (CATHETERS) ×2 IMPLANT
DEVICE RAD TR BAND REGULAR (VASCULAR PRODUCTS) ×2 IMPLANT
GLIDESHEATH SLEND SS 6F .021 (SHEATH) ×2 IMPLANT
KIT MANI 3VAL PERCEP (MISCELLANEOUS) ×2 IMPLANT
NEEDLE PERC 18GX7CM (NEEDLE) IMPLANT
PACK CARDIAC CATH (CUSTOM PROCEDURE TRAY) ×2 IMPLANT
SHEATH AVANTI 5FR X 11CM (SHEATH) IMPLANT
WIRE SAFE-T 1.5MM-J .035X260CM (WIRE) ×2 IMPLANT

## 2014-09-05 NOTE — Progress Notes (Signed)
ANTICOAGULATION CONSULT NOTE - Initial Consult  Pharmacy Consult for heparin Indication: chest pain/ACS  Allergies  Allergen Reactions  . Aspirin     REACTION: aggrivates colitis  . Crestor [Rosuvastatin]     REACTION: increased lfts  . Erythromycin     REACTION: GI upset  . Nsaids     REACTION: aggrivate colitis    Patient Measurements: Height: 5\' 6"  (167.6 cm) Weight: 235 lb (106.595 kg) IBW/kg (Calculated) : 59.3 Heparin Dosing Weight: 83.9 kg  Vital Signs: Temp: 97.3 F (36.3 C) (08/22 0046) Temp Source: Oral (08/22 0046) BP: 104/64 mmHg (08/22 0145) Pulse Rate: 89 (08/22 0145)  Labs:  Recent Labs  09/05/14 0037  HGB 12.3  HCT 39.6  PLT 554*  CREATININE 0.80  TROPONINI <0.03    Estimated Creatinine Clearance: 96.9 mL/min (by C-G formula based on Cr of 0.8).   Medical History: Past Medical History  Diagnosis Date  . Rosacea   . Family history of malignant neoplasm of gastrointestinal tract   . Allergic rhinitis, cause unspecified   . Anxiety state, unspecified   . Unspecified asthma(493.90)   . Benign neoplasm of other and unspecified site of the digestive system   . Ulcerative colitis, unspecified   . Depression   . Fibromyalgia   . GERD (gastroesophageal reflux disease)   . Hiatal hernia   . Pure hypercholesterolemia   . Unspecified essential hypertension   . Unspecified hypothyroidism   . Insomnia, unspecified   . History of migraines   . Other screening mammogram   . Asymptomatic postmenopausal status (age-related) (natural)   . Unspecified vitamin D deficiency   . Edema   . Dysuria   . History of ovarian cyst   . Cyst     on Achilles tendon  . Tubular adenoma of colon 01/10/11  . Vertigo   . Subacute confusional state 11/19/2012  . ADD (attention deficit disorder) 11/19/2012  . OCD (obsessive compulsive disorder)   . Glaucoma   . History of alcoholism   . Bipolar disorder   . Diabetes mellitus     frank  . Atypical hyperplasia of  left breast 2000  . Headache     migraines  . Arthritis   . Cancer     Left Breast 78mm invasive CA left uiq, dcis left uoq and a lot ADH, ALH in both breasts.  . Carcinoma of left breast 06/02/2014    Medications:  Infusions:  . heparin      Assessment: 29 yof with chest pain and bilat elbow pain called EMS, no cardiac history just hypertension, limited information however patient does see an oncologist. Tried GI cocktail in ED no relief, some relief after SL NTG. Starting heparin drip to work up ACS.   Goal of Therapy:  Heparin level 0.3-0.7 units/ml Monitor platelets by anticoagulation protocol: Yes   Plan:  Give 4000 units bolus x 1 Start heparin infusion at 1000 units/hr Check anti-Xa level in 6 hours and daily while on heparin Continue to monitor H&H and platelets  Laural Benes, Pharm. D.  Clinical Pharmacist 09/05/2014,1:51 AM

## 2014-09-05 NOTE — Progress Notes (Signed)
[place] [facility] [place] NAME: Lindsay Fry    MR#:  [id number]  DATE OF BIRTH:  August 27, 1960  SUBJECTIVE: 54 year old female patient with hypertension, diabetes, hypothyroidism, hyperlipidemia admitted for chest pain. Troponin peaked 4.4. Patient was taken to cardiac catheter Dr. Fletcher Anon . 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  Patient denies any chest pain now at this time.   CHIEF COMPLAINT:   Chief Complaint  Patient presents with  . Chest Pain    REVIEW OF SYSTEMS:   ROS CONSTITUTIONAL: No fever, fatigue or weakness.  EYES: No blurred or double vision.  EARS, NOSE, AND THROAT: No tinnitus or ear pain.  RESPIRATORY: No cough, shortness of breath, wheezing or hemoptysis.  CARDIOVASCULAR: No chest pain, orthopnea, edema.  GASTROINTESTINAL: No nausea, vomiting, diarrhea or abdominal pain.  GENITOURINARY: No dysuria, hematuria.  ENDOCRINE: No polyuria, nocturia,  HEMATOLOGY: No anemia, easy bruising or bleeding SKIN: No rash or lesion. MUSCULOSKELETAL: No joint pain or arthritis.   NEUROLOGIC: No tingling, numbness, weakness.  PSYCHIATRY: No anxiety or depression.   DRUG ALLERGIES:   Allergies  Allergen Reactions  . Aspirin     REACTION: aggrivates colitis  . Crestor [Rosuvastatin]     REACTION: increased lfts  . Erythromycin     REACTION: GI upset  . Nsaids     REACTION: aggrivate colitis    VITALS:  Blood pressure 168/83, pulse 84, temperature 97.9 F (36.6 C), temperature source Oral, resp. rate 21, height 5' 6"  (1.676 m), weight 106.595 kg (235 lb), last menstrual period 08/23/2006, SpO2 97 %.  PHYSICAL EXAMINATION:  GENERAL:  54 y.o.-year-old patient lying in the bed with no acute distress.  EYES: Pupils equal, round, reactive to light and accommodation. No scleral icterus. Extraocular muscles intact.  HEENT: Head atraumatic, normocephalic. Oropharynx and nasopharynx  clear.  NECK:  Supple, no jugular venous distention. No thyroid enlargement, no tenderness.  LUNGS: Normal breath sounds bilaterally, no wheezing, rales,rhonchi or crepitation. No use of accessory muscles of respiration.  CARDIOVASCULAR: S1, S2 normal. No murmurs, rubs, or gallops.  ABDOMEN: Soft, nontender, nondistended. Bowel sounds present. No organomegaly or mass.  EXTREMITIES: No pedal edema, cyanosis, or clubbing.  NEUROLOGIC: Cranial nerves II through XII are intact. Muscle strength 5/5 in all extremities. Sensation intact. Gait not checked.  PSYCHIATRIC: The patient is alert and oriented x 3.  SKIN: No obvious rash, lesion, or ulcer.    LABORATORY PANEL:   CBC  Recent Labs Lab 09/05/14 0037  WBC 10.7  HGB 12.3  HCT 39.6  PLT 554*   ------------------------------------------------------------------------------------------------------------------  Chemistries   Recent Labs Lab 09/05/14 0037  NA 139  K 3.5  CL 102  CO2 26  GLUCOSE 168*  BUN 13  CREATININE 0.80  CALCIUM 9.9  AST 51*  ALT 47  ALKPHOS 86  BILITOT 0.1*   ------------------------------------------------------------------------------------------------------------------  Cardiac Enzymes  Recent Labs Lab 09/05/14 0634  TROPONINI 4.41*   ------------------------------------------------------------------------------------------------------------------  RADIOLOGY:  Dg Chest 2 View  09/05/2014   CLINICAL DATA:  Sternal central chest pain, left arm tingling and tongue numbness for 4 hours.  EXAM: CHEST  2 VIEW  COMPARISON:  07/28/2006  FINDINGS: Lung volumes are low. Evaluation of the lung bases on the AP view partially obscured by soft tissue attenuation from body habitus. The heart is at the upper limits of normal in size. The lungs are clear. Pulmonary vasculature is normal. No consolidation, pleural effusion, or  pneumothorax. No acute osseous abnormalities are seen.  IMPRESSION: Borderline  cardiomegaly without evidence of acute process.   Electronically Signed   By: Jeb Levering M.D.   On: 09/05/2014 03:09    EKG:   Orders placed or performed during the hospital encounter of 09/05/14  . ED EKG  . ED EKG  . EKG 12-Lead  . 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 Unable to do PCI. Continue aspirin,  Brillinta. Likely discharge tomorrow if stable. Continue aspirin, Brillinta,,high dose statins. 2. Hyperlipidemia continue atorvastatin 80 mg. Diabetes mellitus continue present medications, hold metformin due to cardiac catheter. #4 history of ulcerative colitis continue mesalamine, Lingess. #5 history of depression, fibromyalgia continue present medications. #4 history of bipolar disorder. Likely discharge tomorrow if chest pain free. Lovenox for DVT prophylaxis. D/w husband.  All the records are reviewed and case discussed with Care Management/Social Workerr. Management plans discussed with the patient, family and they are in agreement.  CODE STATUS: full  TOTAL TIME TAKING CARE OF THIS PATIENT: 38mnutes.   POSSIBLE D/C IN 1-2 DAYS, DEPENDING ON CLINICAL CONDITION.   KEpifanio LeschesM.D on 09/05/2014 at 12:42 PM  Between 7am to 6pm - Pager - [phone]  After 6pm go to [email or web address] - password [email or web address] [facility]  Office  [phone] CC: Primary care physician; MLoura Pardon MD

## 2014-09-05 NOTE — ED Provider Notes (Signed)
[facility] Emergency Department Provider Note  ____________________________________________  Time seen: 12:15 AM on arrival by EMS  I have reviewed the triage vital signs and the nursing notes.   HISTORY  Chief Complaint Chest Pain    HPI Lindsay Fry is a 54 y.o. female who complains of a rapid onset of chest pain at rest this evening at about 11:30 PM. It's in the central chest radiating to both arms associated with diaphoresis. No nausea vomiting or shortness of breath. No dizziness or syncope. She does have a history of hypertension diabetes hyperlipidemia truncal obesity former smoker and strong cardiac family history. She is followed by Dr. Rockey Situ.  No aggravating or alleviating factors. Pain is 8 out of 10 and constant, feels like pressure and tightness.     Past Medical History  Diagnosis Date  . Rosacea   . Family history of malignant neoplasm of gastrointestinal tract   . Allergic rhinitis, cause unspecified   . Anxiety state, unspecified   . Unspecified asthma(493.90)   . Benign neoplasm of other and unspecified site of the digestive system   . Ulcerative colitis, unspecified   . Depression   . Fibromyalgia   . GERD (gastroesophageal reflux disease)   . Hiatal hernia   . Pure hypercholesterolemia   . Unspecified essential hypertension   . Unspecified hypothyroidism   . Insomnia, unspecified   . History of migraines   . Other screening mammogram   . Asymptomatic postmenopausal status (age-related) (natural)   . Unspecified vitamin D deficiency   . Edema   . Dysuria   . History of ovarian cyst   . Cyst     on Achilles tendon  . Tubular adenoma of colon 01/10/11  . Vertigo   . Subacute confusional state 11/19/2012  . ADD (attention deficit disorder) 11/19/2012  . OCD (obsessive compulsive disorder)   . Glaucoma   . History of alcoholism   . Bipolar disorder   . Diabetes mellitus     frank  . Atypical hyperplasia of left  breast 2000  . Headache     migraines  . Arthritis   . Cancer     Left Breast 26mm invasive CA left uiq, dcis left uoq and a lot ADH, ALH in both breasts.  . Carcinoma of left breast 06/02/2014    Patient Active Problem List   Diagnosis Date Noted  . DM (diabetes mellitus) 09/05/2014  . 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 Carcinoma of left breast 06/02/2014  . Sinus tachycardia 04/27/2014  . Obesity 05/20/2013  . Bladder pain 02/17/2013  . Subacute confusional state 11/19/2012  . ADD (attention deficit disorder) 11/19/2012  . Chest pain 08/05/2012  . Urinary frequency 08/05/2012  . Low back pain 08/05/2012  . 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 Chronic sinusitis 01/24/2012  . Vertigo, benign positional 01/24/2012  . Other screening mammogram 12/05/2010  . Routine general medical examination at a health care facility 10/14/2010  . FATIGUE 08/09/2009  . POSTMENOPAUSAL STATUS 06/22/2008  . UNSPECIFIED VITAMIN D DEFICIENCY 03/18/2008  . BENIGN NEOPLASM [place] SITE DIGESTIVE SYSTEM 01/09/2007  . HIATAL HERNIA 01/09/2007  . COLONIC POLYPS, ADENOMATOUS, HX OF 01/09/2007  . HYPERCHOLESTEROLEMIA 01/07/2007  . Bipolar disorder, unspecified 01/07/2007  . DEPRESSION 01/07/2007  . Essential hypertension 01/07/2007  . ALLERGIC RHINITIS 01/07/2007  . ASTHMA 01/07/2007  . GERD 01/07/2007  . COLITIS, ULCERATIVE 01/07/2007  . ACNE ROSACEA 01/07/2007  . MIGRAINES, HX OF 01/07/2007  . Fibromyalgia 10/30/2006  .  INSOMNIA 10/30/2006    Past Surgical History  Procedure Laterality Date  . Wisdom tooth extraction  1980's  . Tonsillectomy  1984  . Cesarean section  1996/1997    placenta previa, gest DM, pre-eclampsia  . Nasal sinus surgery  05/1997  . Precancerous mole removed    . Cholecystectomy  1997    adhesions also  . Meckel diverticulum excision  1999  . Dexa  04/1999 and 2010    normal  . Colonoscopy  01/2001    Ulcerative colitis  .  Esophagogastroduodenoscopy  09/2001    polyp  . Exercise stress test  11/2003    negative  . Nuclear stress test  06/2007    negative  . Colonoscopy  04/2004    UC, polyp  . Colonoscopy  12/08    UC, no polyps  . Sleep study  11/09    no apnea, but did snore (done by [facility])  . Bunionectomy Right   . Femur fracture surgery Left   . Appendectomy    . Tubal ligation    . Simple mastectomy with axillary sentinel node biopsy Bilateral 05/23/2014    Procedure: Bilateral simple mastectomy, left sentinel node biopsy ;  Surgeon: Christene Lye, MD;  Location: [facility] ORS;  Service: General;  Laterality: Bilateral;  . Axillary sentinel node biopsy Left 05/23/2014    Procedure: AXILLARY SENTINEL NODE BIOPSY;  Surgeon: Christene Lye, MD;  Location: [facility] ORS;  Service: General;  Laterality: Left;  Marland Kitchen Mastectomy Bilateral 05-23-14    Dr. Jamal Collin  . Breast biopsy  Aug 2000    on tamoxifen, atypical hyperplasia  . Breast surgery Left Aug 2000    lumpectomy/ Dr Sharlet Salina  . Breast surgery Bilateral 05/23/14    Mastectomy    Current Outpatient Rx  Name  Route  Sig  Dispense  Refill  . acetaminophen (TYLENOL ARTHRITIS PAIN) 650 MG CR tablet   Oral   Take 1,300 mg by mouth 2 (two) times daily as needed.          Marland Kitchen amLODipine (NORVASC) 10 MG tablet      TAKE ONE TABLET BY MOUTH EVERY NIGHT AT BEDTIME   90 tablet   0   . amphetamine-dextroamphetamine (ADDERALL) 30 MG tablet   Oral   Take 30 mg by mouth 2 (two) times daily.         . Asenapine Maleate (SAPHRIS) 10 MG SUBL   Sublingual   Place 20 mg under the tongue at bedtime.         Marland Kitchen aspirin 81 MG tablet   Oral   Take 81 mg by mouth daily.         Marland Kitchen atorvastatin (LIPITOR) 40 MG tablet      TAKE 1/2 TABLET BY MOUTH EVERY DAY   45 tablet   1   . b complex vitamins capsule   Oral   Take 1 capsule by mouth daily.         Marland Kitchen buPROPion (WELLBUTRIN XL) 150 MG 24 hr tablet   Oral   Take 450 mg by mouth daily.           . Calcium 600-200 MG-UNIT per tablet   Oral   Take 1 tablet by mouth 2 (two) times daily.   60 tablet   6   . chlorproMAZINE (THORAZINE) 25 MG tablet   Oral   Take 25-50 mg by mouth as needed. For migraines if frova doesn't help.         Marland Kitchen  Cholecalciferol (VITAMIN D3) 2000 UNITS TABS   Oral   Take 1 tablet by mouth daily.         . ciprofloxacin (CIPRO) 500 MG tablet   Oral   Take 1 tablet (500 mg total) by mouth 2 (two) times daily.   20 tablet   0   . cloNIDine (CATAPRES) 0.1 MG tablet      TAKE 1 TABLET BY MOUTH TWICE DAILY   60 tablet   2   . co-enzyme Q-10 50 MG capsule   Oral   Take 50 mg by mouth daily.         . cyclobenzaprine (FLEXERIL) 10 MG tablet      TAKE 2 TABLETS BY MOUTH EVERY NIGHT AT BEDTIME   180 tablet   1   . diazepam (VALIUM) 10 MG tablet   Oral   Take 2.5-10 mg by mouth as needed for anxiety.         . diphenhydrAMINE (BENADRYL) 50 MG tablet   Oral   Take 50 mg by mouth at bedtime as needed (anxiety).          . fluticasone (FLONASE) 50 MCG/ACT nasal spray      USE 2 SPRAYS IN EACH NOSTRIL TWICE DAILY   16 g   5   . gabapentin (NEURONTIN) 100 MG capsule   Oral   Take 100 mg by mouth 2 (two) times daily.         Marland Kitchen gabapentin (NEURONTIN) 300 MG capsule   Oral   Take 1 capsule (300 mg total) by mouth 3 (three) times daily.   90 capsule   3   . glucose blood (ONETOUCH VERIO) test strip      Use as instructed to check blood sugar twice a day   100 each   3   . JANUMET XR [phone] MG TB24      TAKE 1 TABLET BY MOUTH DAILY AT DINNER   30 tablet   3   . lamoTRIgine (LAMICTAL) 200 MG tablet   Oral   Take 400 mg by mouth daily.         Marland Kitchen latanoprost (XALATAN) 0.005 % ophthalmic solution   Both Eyes   Place 1 drop into both eyes At bedtime.         Marland Kitchen letrozole (FEMARA) 2.5 MG tablet   Oral   Take 1 tablet (2.5 mg total) by mouth daily.   30 tablet   6   . Linaclotide (LINZESS) 290 MCG CAPS  capsule   Oral   Take 1 capsule (290 mcg total) by mouth daily.   30 capsule   6   . Liniments (SALONPAS PAIN RELIEF PATCH EX)   Apply externally   Apply topically as needed.         . mesalamine (LIALDA) 1.2 G EC tablet   Oral   Take 1 tablet (1.2 g total) by mouth daily with breakfast.   30 tablet   6   . Multiple Vitamin (MULTIVITAMIN) tablet   Oral   Take 1 tablet by mouth daily.           . nitrofurantoin, macrocrystal-monohydrate, (MACROBID) 100 MG capsule   Oral   Take 1 capsule by mouth every 12 (twelve) hours.      5   . Omega-3 Krill Oil 500 MG CAPS   Oral   Take 500 mg by mouth.         . pantoprazole (PROTONIX) 40 MG  tablet      TAKE 1 TABLET BY MOUTH TWICE DAILY   180 tablet   1   . Probiotic Product (PROBIOTIC DAILY PO)   Oral   Take by mouth.         . sennosides-docusate sodium (SENOKOT-S) 8.6-50 MG tablet   Oral   Take 5 tablets by mouth at bedtime.         . Simethicone (GAS-X PO)   Oral   Take 1 tablet by mouth 4 (four) times daily.         Marland Kitchen spironolactone (ALDACTONE) 25 MG tablet   Oral   Take 25 mg by mouth 2 (two) times daily.           Marland Kitchen SYNTHROID 137 MCG tablet   Oral   Take 274 mcg by mouth daily before breakfast.       3     Dispense as written.   Marland Kitchen tiZANidine (ZANAFLEX) 4 MG tablet   Oral   Take 4 mg by mouth. Take 1 by mouth as needed for mirgaine, may repeat after 2 hours if no relief         . traMADol (ULTRAM) 50 MG tablet      2 (two) times daily.      3   . valACYclovir (VALTREX) 500 MG tablet   Oral   Take 500 mg by mouth 2 (two) times daily.           . vitamin C (ASCORBIC ACID) 500 MG tablet   Oral   Take 250 mg by mouth daily. Takes 2 a day         . zolpidem (AMBIEN) 10 MG tablet   Oral   Take 10 mg by mouth at bedtime.         . nitrofurantoin (MACRODANTIN) 100 MG capsule   Oral   Take 100 mg by mouth as directed. With sexual activity            Allergies Aspirin;  Crestor; Erythromycin; and Nsaids  Family History  Problem Relation Age of Onset  . Coronary artery disease Father   . Colon cancer Father   . Alzheimer's disease Father   . Dementia Father   . Coronary artery disease Mother   . Hypertension Mother   . Osteoporosis Mother   . Colitis Mother   . Crohn's disease Mother   . Breast cancer      great aunts  . Coronary artery disease      Uncle (also AAA)  . Diabetes      remote family history  . Stroke Cousin   . Esophageal cancer Neg Hx   . Rectal cancer Neg Hx   . Stomach cancer Neg Hx     Social History Social History  Substance Use Topics  . Smoking status: Former Smoker -- 0.25 packs/day for 5 years    Types: Cigarettes    Quit date: 01/14/1993  . Smokeless tobacco: Never Used  . Alcohol Use: No     Comment: Recovered ETOH    Review of Systems  Constitutional: No fever or chills. No weight changes Eyes:No blurry vision or double vision.  ENT: No sore throat. Cardiovascular: Positive chest pain as above. Respiratory: No dyspnea or cough. Gastrointestinal: Negative for abdominal pain, vomiting and diarrhea.  No BRBPR or melena. Genitourinary: Negative for dysuria, urinary retention, bloody urine, or difficulty urinating. Musculoskeletal: Negative for back pain. No joint swelling or pain. Skin: Negative for rash. Neurological:  Negative for headaches, focal weakness or numbness. Psychiatric:No anxiety or depression.   Endocrine:No hot/cold intolerance, changes in energy, or sleep difficulty.  10-point ROS otherwise negative.  ____________________________________________   PHYSICAL EXAM:  VITAL SIGNS: ED Triage Vitals  Enc Vitals Group     BP 09/05/14 0046 118/81 mmHg     Pulse Rate 09/05/14 0046 81     Resp 09/05/14 0100 20     Temp 09/05/14 0046 97.3 F (36.3 C)     Temp Source 09/05/14 0046 Oral     SpO2 09/05/14 0046 94 %     Weight 09/05/14 0046 235 lb (106.595 kg)     Height 09/05/14 0046 5\' 6"   (1.676 m)     Head Cir --      Peak Flow --      Pain Score 09/05/14 0043 8     Pain Loc --      Pain Edu? --      Excl. in [place]? --      Constitutional: Alert and oriented. Mild distress due to pain. Eyes: No scleral icterus. No conjunctival pallor. PERRL. EOMI ENT   Head: Normocephalic and atraumatic.   Nose: No congestion/rhinnorhea. No septal hematoma   Mouth/Throat: MMM, no pharyngeal erythema. No peritonsillar mass. No uvula shift.   Neck: No stridor. No SubQ emphysema. No meningismus. Hematological/Lymphatic/Immunilogical: No cervical lymphadenopathy. Cardiovascular: RRR. Normal and symmetric distal pulses are present in all extremities. No murmurs, rubs, or gallops. Respiratory: Normal respiratory effort without tachypnea nor retractions. Breath sounds are clear and equal bilaterally. No wheezes/rales/rhonchi. Chest wall nontender Gastrointestinal: Soft and nontender. No distention. There is no CVA tenderness.  No rebound, rigidity, or guarding. Genitourinary: deferred Musculoskeletal: Nontender with normal range of motion in all extremities. No joint effusions.  No lower extremity tenderness.  No edema. Neurologic:   Normal speech and language.  CN 2-10 normal. Motor grossly intact. No pronator drift.  Normal gait. No gross focal neurologic deficits are appreciated.  Skin:  Skin is warm, dry and intact. No rash noted.  No petechiae, purpura, or bullae. Psychiatric: Mood and affect are normal. Speech and behavior are normal. Patient exhibits appropriate insight and judgment.  ____________________________________________    LABS (pertinent positives/negatives) (all labs ordered are listed, but only abnormal results are displayed) Labs Reviewed  COMPREHENSIVE METABOLIC PANEL - Abnormal; Notable for the following:    Glucose, Bld 168 (*)    AST 51 (*)    Total Bilirubin 0.1 (*)    All other components within normal limits  CBC WITH DIFFERENTIAL/PLATELET -  Abnormal; Notable for the following:    RBC 5.37 (*)    MCV 73.9 (*)    MCH 22.9 (*)    MCHC 31.0 (*)    RDW 19.8 (*)    Platelets 554 (*)    Monocytes Absolute 1.0 (*)    Eosinophils Absolute 0.8 (*)    All other components within normal limits  LIPASE, BLOOD  TROPONIN I  APTT  HEPARIN LEVEL (UNFRACTIONATED)   ____________________________________________   EKG  Interpreted by me Normal sinus rhythm rate of 83, first-degree AV block, left axis, normal QRS. Poor R progression in anterior precordial leads. T-wave inversions in 23 aVF and V5 and V6. This is new compared to previous EKG on 04/27/2014. No significant ST elevation or depression.  ____________________________________________    RADIOLOGY    ____________________________________________   PROCEDURES CRITICAL CARE Performed by: Joni Fears, Liseth Wann   Total critical care time: 35 minutes  Critical care  time was exclusive of separately billable procedures and treating other patients.  Critical care was necessary to treat or prevent imminent or life-threatening deterioration.  Critical care was time spent personally by me on the following activities: development of treatment plan with patient and/or surrogate as well as nursing, discussions with consultants, evaluation of patient's response to treatment, examination of patient, obtaining history from patient or surrogate, ordering and performing treatments and interventions, ordering and review of laboratory studies, ordering and review of radiographic studies, pulse oximetry and re-evaluation of patient's condition.  ____________________________________________   INITIAL IMPRESSION / ASSESSMENT AND PLAN / ED COURSE  Pertinent labs & imaging results that were available during my care of the patient were reviewed by me and considered in my medical decision making (see chart for details).  Patient presents with chest pain and has multiple risk factors for CAD  including hypertension diabetes hyperlipidemia truncal obesity and strong family history. The patient is also a former smoker. She reports an allergy to aspirin but does take 81 mg daily but is concerned that it causes GI upset and gastritis. I discussed with her the concern for ACS and she is amenable to taking it one time dose of additional aspirin. She is taking 81 mg today already and will give her an additional 162 mg. Will give nitroglycerin 3 and check labs.   ----------------------------------------- 1:56 AM on 09/05/2014 -----------------------------------------  Labs unremarkable. Pain went from 9 out of 10-5 out of 10 after 1 nitroglycerin however she had a precipitous drop of blood pressure. Based on this and the EKG it is likely that her chest pain represents an inferolateral MI with right ventricular involvement. We will hold off on any further nitrates due to concern for hemodynamic instability and give her morphine for pain control. We'll start heparin and admitted to the hospital for further cardiac evaluation. ----------------------------------------- 2:47 AM on 09/05/2014 -----------------------------------------  Chest x-ray requested by Dr. Reece Levy, currently pending. Will follow-up. Dr. Reece Levy was evaluated and will proceed with inpatient management. ____________________________________________   FINAL CLINICAL IMPRESSION(S) / ED DIAGNOSES  Final diagnoses:  NSTEMI (non-ST elevated myocardial infarction)      Carrie Mew, MD 09/05/14 [phone]

## 2014-09-05 NOTE — ED Notes (Addendum)
Pt reports decreasing CP after NTG SL x 1; 5/10; will hold add't NTG due to BP 104/64 from 156/139; MD notified

## 2014-09-05 NOTE — Progress Notes (Signed)
Report to cheryl telemetry.  Keep right wrist elevated on pillow above the heart for today.  Watch right wrist for evidence of bleeding or hematoma.. If bleeding or hematoma noted, hold pressure over the site for at least 15 minutes and notify the physician.  No blending or flexing of the wrist--no lifting for the remainder of the day or for 2 weeks after the procedure.

## 2014-09-05 NOTE — CV Procedure (Signed)
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 Not optimal location for PCI.  Recommend attempted medical therapy first.  I added Brilinta for 1 year. BP control.  Possible discharge tomorrow if she continues to be pain free.

## 2014-09-05 NOTE — Care Management (Signed)
Patient had cardiac cath today.  There is an occlusion that is not optimal for PCI. Will be managed medically.   Cardiology had added Brilinta.  Provided patient with drug coupons for Brilinta.  She states that she does have drug coverage with her [employer]

## 2014-09-05 NOTE — Progress Notes (Addendum)
This patient had PPSV 23, Pneumovax, in 2009 and is not a candidate for repeat vaccination.   Shirin Echeverry A. [place], [place].D. Clinical Pharmacist 09/05/14 [phone]

## 2014-09-05 NOTE — Progress Notes (Signed)
*  PRELIMINARY RESULTS* Echocardiogram 2D Echocardiogram has been performed.  Lindsay Fry 09/05/2014, 5:57 PM

## 2014-09-05 NOTE — ED Notes (Signed)
Patient states onset of chest pain with bilateral elbow pain, and tongue pain at 2315. Patient called EMS immediately. States no cardiac history just that she has hypertension. Denies nausea or vomiting. Denies shortness of breath. Patient has history of diabetes -EMS blood sugar 128. Alert and oriented, husband at bedside.

## 2014-09-05 NOTE — ED Notes (Signed)
Pt uprite on stretcher with eyes closed, no distress; husband at bedside; pt reports pain 7/10 with no changes after Gi cocktail; pt & husb voice good understanding of plan of care

## 2014-09-05 NOTE — H&P (Signed)
[facility] NAME: Lindsay Fry    MR#:  [id number]  DATE OF BIRTH:  06-08-1960  DATE OF ADMISSION:  09/05/2014  PRIMARY CARE PHYSICIAN: [name], MD   REQUESTING/REFERRING PHYSICIAN: Joni Fears  CHIEF COMPLAINT:   Chief Complaint  Patient presents with  . Chest Pain    HISTORY OF PRESENT ILLNESS:  Lindsay Fry  is a 54 y.o. female with a known history of hypertension, diabetes mellitus type II, hypothyroidism, hyperlipidemia, GERD, ulcerative colitis, depression presents to the emergency room with the complaints of chest pain. Around 11:30 PM last night patient developed bilateral elbow pain and later on she started having chest pain and tongue pain hence called EMS and came to the emergency room. No associated shortness of breath, dizziness, palpitations, fever, chills, cough, nausea, vomiting, diarrhea, abdominal pain, dysuria. Evaluation in the ED revealed stable vital signs and EKG normal sinus rhythm with ventricular rate of 83 bpm, left axis deviation, T-wave abnormality in the lateral leads. Lab work was essentially unremarkable with first set of troponin less than 0.03. Patient was given aspirin and was also given sublingual nitroglycerin following which her blood pressure drops significantly. Patient's chest pain resolved following about treatment. Patient was started on heparin drip by the ED physician and hospitalist service was consulted for further management. Patient at the current time is comfortably resting in the bed and states her chest pain is resolved.  PAST MEDICAL HISTORY:   Past Medical History  Diagnosis Date  . Rosacea   . Family history of malignant neoplasm of gastrointestinal tract   . Allergic rhinitis, cause unspecified   . Anxiety state, unspecified   . Unspecified asthma(493.90)   . Benign neoplasm of other and unspecified site of the digestive system   . Ulcerative colitis, unspecified    . Depression   . Fibromyalgia   . GERD (gastroesophageal reflux disease)   . Hiatal hernia   . Pure hypercholesterolemia   . Unspecified essential hypertension   . Unspecified hypothyroidism   . Insomnia, unspecified   . History of migraines   . Other screening mammogram   . Asymptomatic postmenopausal status (age-related) (natural)   . Unspecified vitamin D deficiency   . Edema   . Dysuria   . History of ovarian cyst   . Cyst     on Achilles tendon  . Tubular adenoma of colon 01/10/11  . Vertigo   . Subacute confusional state 11/19/2012  . ADD (attention deficit disorder) 11/19/2012  . OCD (obsessive compulsive disorder)   . Glaucoma   . History of alcoholism   . Bipolar disorder   . Diabetes mellitus     frank  . Atypical hyperplasia of left breast 2000  . Headache     migraines  . Arthritis   . Cancer     Left Breast 33mm invasive CA left uiq, dcis left uoq and a lot ADH, ALH in both breasts.  . Carcinoma of left breast 06/02/2014    PAST SURGICAL HISTORY:   Past Surgical History  Procedure Laterality Date  . Wisdom tooth extraction  1980's  . Tonsillectomy  1984  . Cesarean section  1996/1997    placenta previa, gest DM, pre-eclampsia  . Nasal sinus surgery  05/1997  . Precancerous mole removed    . Cholecystectomy  1997    adhesions also  . Meckel diverticulum excision  1999  . Dexa  04/1999 and 2010    normal  .  Colonoscopy  01/2001    Ulcerative colitis  . Esophagogastroduodenoscopy  09/2001    polyp  . Exercise stress test  11/2003    negative  . Nuclear stress test  06/2007    negative  . Colonoscopy  04/2004    UC, polyp  . Colonoscopy  12/08    UC, no polyps  . Sleep study  11/09    no apnea, but did snore (done by [facility])  . Bunionectomy Right   . Femur fracture surgery Left   . Appendectomy    . Tubal ligation    . Simple mastectomy with axillary sentinel node biopsy Bilateral 05/23/2014    Procedure: Bilateral simple mastectomy, left  sentinel node biopsy ;  Surgeon: Christene Lye, MD;  Location: [facility] ORS;  Service: General;  Laterality: Bilateral;  . Axillary sentinel node biopsy Left 05/23/2014    Procedure: AXILLARY SENTINEL NODE BIOPSY;  Surgeon: Christene Lye, MD;  Location: [facility] ORS;  Service: General;  Laterality: Left;  Marland Kitchen Mastectomy Bilateral 05-23-14    Dr. Jamal Collin  . Breast biopsy  Aug 2000    on tamoxifen, atypical hyperplasia  . Breast surgery Left Aug 2000    lumpectomy/ Dr Sharlet Salina  . Breast surgery Bilateral 05/23/14    Mastectomy    SOCIAL HISTORY:   Social History  Substance Use Topics  . Smoking status: Former Smoker -- 0.25 packs/day for 5 years    Types: Cigarettes    Quit date: 01/14/1993  . Smokeless tobacco: Never Used  . Alcohol Use: No     Comment: Recovered ETOH    FAMILY HISTORY:   Family History  Problem Relation Age of Onset  . Coronary artery disease Father   . Colon cancer Father   . Alzheimer's disease Father   . Dementia Father   . Coronary artery disease Mother   . Hypertension Mother   . Osteoporosis Mother   . Colitis Mother   . Crohn's disease Mother   . Breast cancer      great aunts  . Coronary artery disease      Uncle (also AAA)  . Diabetes      remote family history  . Stroke Cousin   . Esophageal cancer Neg Hx   . Rectal cancer Neg Hx   . Stomach cancer Neg Hx     DRUG ALLERGIES:   Allergies  Allergen Reactions  . Aspirin     REACTION: aggrivates colitis  . Crestor [Rosuvastatin]     REACTION: increased lfts  . Erythromycin     REACTION: GI upset  . Nsaids     REACTION: aggrivate colitis    REVIEW OF SYSTEMS:   Review of Systems  Constitutional: Negative for fever, chills and malaise/fatigue.  HENT: Negative for ear pain, hearing loss, nosebleeds, sore throat and tinnitus.   Eyes: Negative for blurred vision, double vision, pain, discharge and redness.  Respiratory: Negative for cough, hemoptysis, sputum production, shortness  of breath and wheezing.   Cardiovascular: Positive for chest pain. Negative for palpitations, orthopnea and leg swelling.  Gastrointestinal: Negative for nausea, vomiting, abdominal pain, diarrhea, constipation, blood in stool and melena.  Genitourinary: Negative for dysuria, urgency, frequency and hematuria.  Musculoskeletal: Negative for back pain, joint pain and neck pain.       Bilateral elbow pain  Skin: Negative for itching and rash.  Neurological: Negative for dizziness, tingling, sensory change, focal weakness and seizures.  Endo/Heme/Allergies: Does not bruise/bleed easily.  Psychiatric/Behavioral: Positive for  depression. The patient is not nervous/anxious.     MEDICATIONS AT HOME:   Prior to Admission medications   Medication Sig Start Date End Date Taking? Authorizing Provider  acetaminophen (TYLENOL ARTHRITIS PAIN) 650 MG CR tablet Take 1,300 mg by mouth 2 (two) times daily as needed.    Yes Historical Provider, MD  amLODipine (NORVASC) 10 MG tablet TAKE ONE TABLET BY MOUTH EVERY NIGHT AT BEDTIME 06/09/14  Yes Abner Greenspan, MD  amphetamine-dextroamphetamine (ADDERALL) 30 MG tablet Take 30 mg by mouth 2 (two) times daily.   Yes Historical Provider, MD  Asenapine Maleate (SAPHRIS) 10 MG SUBL Place 20 mg under the tongue at bedtime.   Yes Historical Provider, MD  aspirin 81 MG tablet Take 81 mg by mouth daily.   Yes Historical Provider, MD  atorvastatin (LIPITOR) 40 MG tablet TAKE 1/2 TABLET BY MOUTH EVERY DAY 08/29/14  Yes Abner Greenspan, MD  b complex vitamins capsule Take 1 capsule by mouth daily.   Yes Historical Provider, MD  buPROPion (WELLBUTRIN XL) 150 MG 24 hr tablet Take 450 mg by mouth daily.    Yes Historical Provider, MD  Calcium 600-200 MG-UNIT per tablet Take 1 tablet by mouth 2 (two) times daily. 07/13/14  Yes Forest Gleason, MD  chlorproMAZINE (THORAZINE) 25 MG tablet Take 25-50 mg by mouth as needed. For migraines if frova doesn't help.   Yes Historical Provider, MD   Cholecalciferol (VITAMIN D3) 2000 UNITS TABS Take 1 tablet by mouth daily.   Yes Historical Provider, MD  ciprofloxacin (CIPRO) 500 MG tablet Take 1 tablet (500 mg total) by mouth 2 (two) times daily. 07/20/14  Yes Seeplaputhur Robinette Haines, MD  cloNIDine (CATAPRES) 0.1 MG tablet TAKE 1 TABLET BY MOUTH TWICE DAILY 05/12/14  Yes Abner Greenspan, MD  co-enzyme Q-10 50 MG capsule Take 50 mg by mouth daily.   Yes Historical Provider, MD  cyclobenzaprine (FLEXERIL) 10 MG tablet TAKE 2 TABLETS BY MOUTH EVERY NIGHT AT BEDTIME 03/15/14  Yes Marne A Tower, MD  diazepam (VALIUM) 10 MG tablet Take 2.5-10 mg by mouth as needed for anxiety.   Yes Historical Provider, MD  diphenhydrAMINE (BENADRYL) 50 MG tablet Take 50 mg by mouth at bedtime as needed (anxiety).    Yes Historical Provider, MD  fluticasone (FLONASE) 50 MCG/ACT nasal spray USE 2 SPRAYS IN EACH NOSTRIL TWICE DAILY 09/02/14  Yes Abner Greenspan, MD  gabapentin (NEURONTIN) 100 MG capsule Take 100 mg by mouth 2 (two) times daily.   Yes Historical Provider, MD  gabapentin (NEURONTIN) 300 MG capsule Take 1 capsule (300 mg total) by mouth 3 (three) times daily. 08/15/14  Yes Abner Greenspan, MD  glucose blood (ONETOUCH VERIO) test strip Use as instructed to check blood sugar twice a day 04/15/14  Yes Elayne Snare, MD  JANUMET XR [phone] MG TB24 TAKE 1 TABLET BY MOUTH DAILY AT [facility] 07/21/14  Yes Elayne Snare, MD  lamoTRIgine (LAMICTAL) 200 MG tablet Take 400 mg by mouth daily.   Yes Historical Provider, MD  latanoprost (XALATAN) 0.005 % ophthalmic solution Place 1 drop into both eyes At bedtime. 10/12/10  Yes Historical Provider, MD  letrozole (FEMARA) 2.5 MG tablet Take 1 tablet (2.5 mg total) by mouth daily. 07/13/14  Yes Forest Gleason, MD  Linaclotide (LINZESS) 290 MCG CAPS capsule Take 1 capsule (290 mcg total) by mouth daily. 07/01/14  Yes Lafayette Dragon, MD  Liniments [facility] PAIN RELIEF PATCH EX) Apply topically as needed.   Yes  Historical Provider, MD  mesalamine (LIALDA)  1.2 G EC tablet Take 1 tablet (1.2 g total) by mouth daily with breakfast. 07/01/14  Yes Lafayette Dragon, MD  Multiple Vitamin (MULTIVITAMIN) tablet Take 1 tablet by mouth daily.     Yes Historical Provider, MD  nitrofurantoin, macrocrystal-monohydrate, (MACROBID) 100 MG capsule Take 1 capsule by mouth every 12 (twelve) hours. 08/29/14  Yes Historical Provider, MD  Omega-3 Krill Oil 500 MG CAPS Take 500 mg by mouth.   Yes Historical Provider, MD  pantoprazole (PROTONIX) 40 MG tablet TAKE 1 TABLET BY MOUTH TWICE DAILY 06/01/14  Yes Lafayette Dragon, MD  Probiotic Product (PROBIOTIC DAILY PO) Take by mouth.   Yes Historical Provider, MD  sennosides-docusate sodium (SENOKOT-S) 8.6-50 MG tablet Take 5 tablets by mouth at bedtime.   Yes Historical Provider, MD  Simethicone (GAS-X PO) Take 1 tablet by mouth 4 (four) times daily.   Yes Historical Provider, MD  spironolactone (ALDACTONE) 25 MG tablet Take 25 mg by mouth 2 (two) times daily.     Yes Historical Provider, MD  SYNTHROID 137 MCG tablet Take 274 mcg by mouth daily before breakfast.  06/10/14  Yes Historical Provider, MD  tiZANidine (ZANAFLEX) 4 MG tablet Take 4 mg by mouth. Take 1 by mouth as needed for mirgaine, may repeat after 2 hours if no relief   Yes Historical Provider, MD  traMADol (ULTRAM) 50 MG tablet 2 (two) times daily. 08/01/14  Yes Historical Provider, MD  valACYclovir (VALTREX) 500 MG tablet Take 500 mg by mouth 2 (two) times daily.     Yes Historical Provider, MD  vitamin C (ASCORBIC ACID) 500 MG tablet Take 250 mg by mouth daily. Takes 2 a day   Yes Historical Provider, MD  zolpidem (AMBIEN) 10 MG tablet Take 10 mg by mouth at bedtime.   Yes Historical Provider, MD  nitrofurantoin (MACRODANTIN) 100 MG capsule Take 100 mg by mouth as directed. With sexual activity     Historical Provider, MD      VITAL SIGNS:  Blood pressure 114/87, pulse 84, temperature 97.3 F (36.3 C), temperature source Oral, resp. rate 20, height 5\' 6"  (1.676 m),  weight 106.595 kg (235 lb), last menstrual period 08/23/2006, SpO2 97 %.  PHYSICAL EXAMINATION:  Physical Exam  Constitutional: She is oriented to person, place, and time. She appears well-developed and well-nourished. No distress.  HENT:  Head: Normocephalic and atraumatic.  Right Ear: External ear normal.  Left Ear: External ear normal.  Nose: Nose normal.  Mouth/Throat: Oropharynx is clear and moist. No oropharyngeal exudate.  Eyes: EOM are normal. Pupils are equal, round, and reactive to light. No scleral icterus.  Neck: Normal range of motion. Neck supple. No JVD present. No thyromegaly present.  Cardiovascular: Normal rate, regular rhythm, normal heart sounds and intact distal pulses.  Exam reveals no friction rub.   No murmur heard. Respiratory: Effort normal and breath sounds normal. No respiratory distress. She has no wheezes. She has no rales. She exhibits no tenderness.  GI: Soft. Bowel sounds are normal. She exhibits no distension and no mass. There is no tenderness. There is no rebound and no guarding.  Musculoskeletal: Normal range of motion. She exhibits no edema.  Lymphadenopathy:    She has no cervical adenopathy.  Neurological: She is alert and oriented to person, place, and time. She has normal reflexes. She displays normal reflexes. No cranial nerve deficit. She exhibits normal muscle tone.  Skin: Skin is warm. No rash noted. No  erythema.  Psychiatric: She has a normal mood and affect. Her behavior is normal. Thought content normal.   LABORATORY PANEL:   CBC  Recent Labs Lab 09/05/14 0037  WBC 10.7  HGB 12.3  HCT 39.6  PLT 554*   ------------------------------------------------------------------------------------------------------------------  Chemistries   Recent Labs Lab 09/05/14 0037  NA 139  K 3.5  CL 102  CO2 26  GLUCOSE 168*  BUN 13  CREATININE 0.80  CALCIUM 9.9  AST 51*  ALT 47  ALKPHOS 86  BILITOT 0.1*    ------------------------------------------------------------------------------------------------------------------  Cardiac Enzymes  Recent Labs Lab 09/05/14 0037  TROPONINI <0.03   ------------------------------------------------------------------------------------------------------------------  RADIOLOGY:  No results found. Chest x-ray pending at this time EKG:   Orders placed or performed during the hospital encounter of 09/05/14  . ED EKG  . ED EKG  Normal sinus rhythm with ventricular rate of 83 bpm, LAD, T-wave inversions in inferior and lateral leads  IMPRESSION AND PLAN:   54 year old woman with history of hypertension, diabetes placed type II, hyperlipidemia, GERD presents with the complaints of acute onset of chest pain, found to have T-wave inversions in the EKG.  1. Chest pain in a patient with history of multiple CAD risk factors with T-wave inversions on EKG concerning for ACS/non-STEMI. Plan: Admit to telemetry, continue heparin drip, aspirin, atorvastatin, start beta blocker, cycle cardiac enzymes. 2-D echocardiogram and cardiology consultation requested for further evaluation and advice. Of note patient dropped blood pressure significantly following sublingual nitroglycerin administration.  2. Hypertension stable on home medications. Continue same. Monitor blood pressure closely. 3. Diabetes mellitus type 2 stable on home medications. Continue same. Sliding scale insulin, follow blood sugars closely. 4. Hyperlipidemia stable on atorvastatin. Continue same. 5. GERD, stable on PPI. Continue same. 6. Hypothyroidism, stable on Synthroid. Continue same. 7. Ulcerative colitis, stable on home medications. Continue same. 8. Depression, stable on multiple medications. Continue same.    All the records are reviewed and case discussed with ED provider. Management plans discussed with the patient, family and they are in agreement.  CODE STATUS: Full code  TOTAL TIME  TAKING CARE OF THIS PATIENT: 50 minutes.    Azucena Freed N M.D on 09/05/2014 at 2:48 AM  Between 7am to 6pm - Pager - [phone]  After 6pm go to [email or web address] - password [email or web address] [facility]  [facility]  Office  [phone]  CC: Primary care physician; [name], MD

## 2014-09-05 NOTE — Consult Note (Signed)
CARDIOLOGY CONSULT NOTE  Patient ID: Lindsay Fry MRN: [id number] DOB/AGE: 56-Sep-1962 56 y.o.  Admit date: 09/05/2014  Primary Physician : Dr. Glori Bickers Primary Cardiologist New  Reason for Consultation MI  HPI: Lindsay Fry is a 54 y.o. female with a known history of hypertension, diabetes mellitus type II, hypothyroidism, hyperlipidemia, GERD, ulcerative colitis, depression who presented to the emergency room last night with chest pain.  Around 11:30 PM last night patient developed bilateral elbow pain and later on she started having chest pain and tongue pain hence called EMS and came to the emergency room. No associated shortness of breath, dizziness, palpitations, fever, chills, cough, nausea, vomiting, diarrhea, abdominal pain, dysuria. Evaluation in the ED revealed stable vital signs and EKG  showed normal sinus rhythm with 0.5-1 mm of ST elevation in 1 and aVL with reciprocal changes. Troponin was borderline elevated initially and subsequently increased. She is currently chest pain-free. She denies any previous similar symptoms.   Review of systems complete and found to be negative unless listed above   Past Medical History  Diagnosis Date  . Rosacea   . Family history of malignant neoplasm of gastrointestinal tract   . Allergic rhinitis, cause unspecified   . Anxiety state, unspecified   . Unspecified asthma(493.90)   . Benign neoplasm of other and unspecified site of the digestive system   . Ulcerative colitis, unspecified   . Depression   . Fibromyalgia   . GERD (gastroesophageal reflux disease)   . Hiatal hernia   . Pure hypercholesterolemia   . Unspecified essential hypertension   . Unspecified hypothyroidism   . Insomnia, unspecified   . History of migraines   . Other screening mammogram   . Asymptomatic postmenopausal status (age-related) (natural)   . Unspecified vitamin D deficiency   . Edema   . Dysuria   . History of ovarian cyst   . Cyst     on  Achilles tendon  . Tubular adenoma of colon 01/10/11  . Vertigo   . Subacute confusional state 11/19/2012  . ADD (attention deficit disorder) 11/19/2012  . OCD (obsessive compulsive disorder)   . Glaucoma   . History of alcoholism   . Bipolar disorder   . Diabetes mellitus     frank  . Atypical hyperplasia of left breast 2000  . Headache     migraines  . Arthritis   . Cancer     Left Breast 33m invasive CA left uiq, dcis left uoq and a lot ADH, ALH in both breasts.  . Carcinoma of left breast 06/02/2014    Family History  Problem Relation Age of Onset  . Coronary artery disease Father   . Colon cancer Father   . Alzheimer's disease Father   . Dementia Father   . Coronary artery disease Mother   . Hypertension Mother   . Osteoporosis Mother   . Colitis Mother   . Crohn's disease Mother   . Breast cancer      great aunts  . Coronary artery disease      Uncle (also AAA)  . Diabetes      remote family history  . Stroke Cousin   . Esophageal cancer Neg Hx   . Rectal cancer Neg Hx   . Stomach cancer Neg Hx     Social History   Social History  . Marital Status: Married    Spouse Name: alan  . Number of Children: 2  . Years of Education: college   Occupational History  .  unemployed    Social History Main Topics  . Smoking status: Former Smoker -- 0.25 packs/day for 5 years    Types: Cigarettes    Quit date: 01/14/1993  . Smokeless tobacco: Never Used  . Alcohol Use: No     Comment: Recovered ETOH  . Drug Use: No  . Sexual Activity: Not Currently   Other Topics Concern  . 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 Wisdom tooth extraction  1980's  . Tonsillectomy  1984  . Cesarean section  1996/1997    placenta previa, gest DM, pre-eclampsia  . Nasal sinus surgery  05/1997    . Precancerous mole removed    . Cholecystectomy  1997    adhesions also  . Meckel diverticulum excision  1999  . Dexa  04/1999 and 2010    normal  . Colonoscopy  01/2001    Ulcerative colitis  . Esophagogastroduodenoscopy  09/2001    polyp  . Exercise stress test  11/2003    negative  . Nuclear stress test  06/2007    negative  . Colonoscopy  04/2004    UC, polyp  . Colonoscopy  12/08    UC, no polyps  . Sleep study  11/09    no apnea, but did snore (done by [facility])  . Bunionectomy Right   . Femur fracture surgery Left   . Appendectomy    . Tubal ligation    . Simple mastectomy with axillary sentinel node biopsy Bilateral 05/23/2014    Procedure: Bilateral simple mastectomy, left sentinel node biopsy ;  Surgeon: Christene Lye, MD;  Location: [facility] ORS;  Service: General;  Laterality: Bilateral;  . Axillary sentinel node biopsy Left 05/23/2014    Procedure: AXILLARY SENTINEL NODE BIOPSY;  Surgeon: Christene Lye, MD;  Location: [facility] ORS;  Service: General;  Laterality: Left;  Marland Kitchen Mastectomy Bilateral 05-23-14    Dr. Jamal Collin  . Breast biopsy  Aug 2000    on tamoxifen, atypical hyperplasia  . Breast surgery Left Aug 2000    lumpectomy/ Dr Sharlet Salina  . Breast surgery Bilateral 05/23/14    Mastectomy     Prescriptions prior to admission  Medication Sig Dispense Refill Last Dose  . acetaminophen (TYLENOL ARTHRITIS PAIN) 650 MG CR tablet Take 1,300 mg by mouth 2 (two) times daily as needed.    09/04/2014 at Unknown time  . amLODipine (NORVASC) 10 MG tablet TAKE ONE TABLET BY MOUTH EVERY NIGHT AT BEDTIME 90 tablet 0 09/04/2014 at Unknown time  . amphetamine-dextroamphetamine (ADDERALL) 30 MG tablet Take 30 mg by mouth 2 (two) times daily.   09/04/2014 at Unknown time  . Asenapine Maleate (SAPHRIS) 10 MG SUBL Place 20 mg under the tongue at bedtime.   09/04/2014 at Unknown time  . aspirin 81 MG tablet Take 81 mg by mouth daily.   09/04/2014 at Unknown time  . atorvastatin (LIPITOR)  40 MG tablet TAKE 1/2 TABLET BY MOUTH EVERY DAY 45 tablet 1 09/04/2014 at Unknown time  . b complex vitamins capsule Take 1 capsule by mouth daily.   09/04/2014 at Unknown time  . buPROPion (WELLBUTRIN XL) 150 MG 24 hr tablet Take 450 mg by mouth daily.  09/04/2014 at Unknown time  . Calcium 600-200 MG-UNIT per tablet Take 1 tablet by mouth 2 (two) times daily. 60 tablet 6 09/04/2014 at Unknown time  . chlorproMAZINE (THORAZINE) 25 MG tablet Take 25-50 mg by mouth as needed. For migraines if frova doesn't help.   09/04/2014 at Unknown time  . Cholecalciferol (VITAMIN D3) 2000 UNITS TABS Take 1 tablet by mouth daily.   09/04/2014 at Unknown time  . cloNIDine (CATAPRES) 0.1 MG tablet TAKE 1 TABLET BY MOUTH TWICE DAILY 60 tablet 2 09/04/2014 at Unknown time  . co-enzyme Q-10 50 MG capsule Take 50 mg by mouth daily.   09/04/2014 at Unknown time  . cyclobenzaprine (FLEXERIL) 10 MG tablet TAKE 2 TABLETS BY MOUTH EVERY NIGHT AT BEDTIME 180 tablet 1 09/04/2014 at Unknown time  . diazepam (VALIUM) 10 MG tablet Take 2.5-10 mg by mouth as needed for anxiety.   09/04/2014 at Unknown time  . diphenhydrAMINE (BENADRYL) 50 MG tablet Take 50 mg by mouth at bedtime as needed (anxiety).    09/04/2014 at Unknown time  . fluticasone (FLONASE) 50 MCG/ACT nasal spray USE 2 SPRAYS IN EACH NOSTRIL TWICE DAILY 16 g 5 09/04/2014 at Unknown time  . gabapentin (NEURONTIN) 300 MG capsule Take 1 capsule (300 mg total) by mouth 3 (three) times daily. 90 capsule 3 09/04/2014 at Unknown time  . glucose blood (ONETOUCH VERIO) test strip Use as instructed to check blood sugar twice a day 100 each 3 09/04/2014 at Unknown time  . JANUMET XR [phone] MG TB24 TAKE 1 TABLET BY MOUTH DAILY AT DINNER 30 tablet 3 09/04/2014 at Unknown time  . lamoTRIgine (LAMICTAL) 200 MG tablet Take 400 mg by mouth daily.   09/04/2014 at Unknown time  . latanoprost (XALATAN) 0.005 % ophthalmic solution Place 1 drop into both eyes At bedtime.   09/04/2014 at Unknown time    . letrozole (FEMARA) 2.5 MG tablet Take 1 tablet (2.5 mg total) by mouth daily. 30 tablet 6 09/04/2014 at Unknown time  . Linaclotide (LINZESS) 290 MCG CAPS capsule Take 1 capsule (290 mcg total) by mouth daily. 30 capsule 6 09/04/2014 at Unknown time  . Liniments (SALONPAS PAIN RELIEF PATCH EX) Apply topically as needed.   09/04/2014 at Unknown time  . mesalamine (LIALDA) 1.2 G EC tablet Take 1 tablet (1.2 g total) by mouth daily with breakfast. 30 tablet 6 09/04/2014 at Unknown time  . Multiple Vitamin (MULTIVITAMIN) tablet Take 1 tablet by mouth daily.     09/04/2014 at Unknown time  . nitrofurantoin, macrocrystal-monohydrate, (MACROBID) 100 MG capsule Take 1 capsule by mouth every 12 (twelve) hours.  5 Past Month at Unknown time  . Omega-3 Krill Oil 500 MG CAPS Take 500 mg by mouth.   09/04/2014 at Unknown time  . pantoprazole (PROTONIX) 40 MG tablet TAKE 1 TABLET BY MOUTH TWICE DAILY 180 tablet 1 09/04/2014 at Unknown time  . Probiotic Product (PROBIOTIC DAILY PO) Take by mouth.   09/04/2014 at Unknown time  . sennosides-docusate sodium (SENOKOT-S) 8.6-50 MG tablet Take 5 tablets by mouth at bedtime.   09/04/2014 at Unknown time  . Simethicone (GAS-X PO) Take 1 tablet by mouth 4 (four) times daily.   09/04/2014 at Unknown time  . spironolactone (ALDACTONE) 25 MG tablet Take 25 mg by mouth 2 (two) times daily.     09/04/2014 at Unknown time  . SYNTHROID 137 MCG tablet Take 274 mcg by mouth daily before breakfast.   3 09/04/2014 at Unknown time  .  tiZANidine (ZANAFLEX) 4 MG tablet Take 4 mg by mouth. Take 1 by mouth as needed for mirgaine, may repeat after 2 hours if no relief   09/04/2014 at Unknown time  . valACYclovir (VALTREX) 500 MG tablet Take 500 mg by mouth 2 (two) times daily.     09/04/2014 at Unknown time  . vitamin C (ASCORBIC ACID) 500 MG tablet Take 250 mg by mouth daily. Takes 2 a day   09/04/2014 at Unknown time  . zolpidem (AMBIEN) 10 MG tablet Take 10 mg by mouth at bedtime.   09/04/2014 at  Unknown time  . nitrofurantoin (MACRODANTIN) 100 MG capsule Take 100 mg by mouth as directed. With sexual activity    Not Taking at Unknown time    Physical Exam: Blood pressure 124/67, pulse 82, temperature 97.5 F (36.4 C), temperature source Oral, resp. rate 18, height 5' 6"  (1.676 m), weight 235 lb (106.595 kg), last menstrual period 08/23/2006, SpO2 98 %.   Constitutional: She is oriented to person, place, and time. She appears well-developed and well-nourished. No distress.  HENT: No nasal discharge.  Head: Normocephalic and atraumatic.  Eyes: Pupils are equal and round. No discharge.  Neck: Normal range of motion. Neck supple. No JVD present. No thyromegaly present.  Cardiovascular: Normal rate, regular rhythm, normal heart sounds. Exam reveals no gallop and no friction rub. No murmur heard.  Pulmonary/Chest: Effort normal and breath sounds normal. No stridor. No respiratory distress. She has no wheezes. She has no rales. She exhibits no tenderness.  Abdominal: Soft. Bowel sounds are normal. She exhibits no distension. There is no tenderness. There is no rebound and no guarding.  Musculoskeletal: Normal range of motion. She exhibits no edema and no tenderness.  Neurological: She is alert and oriented to person, place, and time. Coordination normal.  Skin: Skin is warm and dry. No rash noted. She is not diaphoretic. No erythema. No pallor.  Psychiatric: She has a normal mood and affect. Her behavior is normal. Judgment and thought content normal.    Labs:   Lab Results  Component Value Date   WBC 10.7 09/05/2014   HGB 12.3 09/05/2014   HCT 39.6 09/05/2014   MCV 73.9* 09/05/2014   PLT 554* 09/05/2014    Recent Labs Lab 09/05/14 0037  NA 139  K 3.5  CL 102  CO2 26  BUN 13  CREATININE 0.80  CALCIUM 9.9  PROT 7.2  BILITOT 0.1*  ALKPHOS 86  ALT 47  AST 51*  GLUCOSE 168*   Lab Results  Component Value Date   TROPONINI 4.41* 09/05/2014       EKG:  showed normal  sinus rhythm with 0.5-1 mm of ST elevation in 1 and aVL with reciprocal changes  ASSESSMENT AND PLAN:   1. Non-ST elevation myocardial infarction with borderline ECG changes suggestive of lateral ST elevation: I agree with aspirin. Continue unfractionated heparin, beta blocker and high-dose statin. I recommend proceeding with cardiac catheterization and possible coronary intervention. I discussed risks, benefits and alternatives and details and the patient understands and willing to proceed.   2. Hyperlipidemia: Recommend increasing atorvastatin from 20 mg to 80 mg due to the above. 3. Diabetes mellitus: Management per primary team.  Signed: Kathlyn Sacramento MD, [facility] 09/05/2014, 8:05 AM

## 2014-09-06 ENCOUNTER — Encounter: Payer: Self-pay | Admitting: Nurse Practitioner

## 2014-09-06 ENCOUNTER — Telehealth: Payer: Self-pay | Admitting: *Deleted

## 2014-09-06 ENCOUNTER — Telehealth: Payer: Self-pay

## 2014-09-06 DIAGNOSIS — I2510 Atherosclerotic heart disease of native coronary artery without angina pectoris: Secondary | ICD-10-CM

## 2014-09-06 DIAGNOSIS — E1159 Type 2 diabetes mellitus with other circulatory complications: Secondary | ICD-10-CM

## 2014-09-06 DIAGNOSIS — I214 Non-ST elevation (NSTEMI) myocardial infarction: Secondary | ICD-10-CM

## 2014-09-06 DIAGNOSIS — I25119 Atherosclerotic heart disease of native coronary artery with unspecified angina pectoris: Secondary | ICD-10-CM

## 2014-09-06 DIAGNOSIS — E780 Pure hypercholesterolemia: Secondary | ICD-10-CM

## 2014-09-06 DIAGNOSIS — I25111 Atherosclerotic heart disease of native coronary artery with angina pectoris with documented spasm: Secondary | ICD-10-CM

## 2014-09-06 DIAGNOSIS — I10 Essential (primary) hypertension: Secondary | ICD-10-CM

## 2014-09-06 MED ORDER — METOPROLOL TARTRATE 25 MG PO TABS
12.5000 mg | ORAL_TABLET | Freq: Once | ORAL | Status: AC
  Administered 2014-09-06: 12.5 mg via ORAL
  Filled 2014-09-06: qty 1

## 2014-09-06 MED ORDER — METOPROLOL TARTRATE 25 MG PO TABS: 25.0000 mg | ORAL_TABLET | Freq: Two times a day (BID) | ORAL | Status: DC

## 2014-09-06 MED ORDER — TICAGRELOR 90 MG PO TABS: 90.0000 mg | ORAL_TABLET | Freq: Two times a day (BID) | ORAL | Status: DC

## 2014-09-06 MED ORDER — CARVEDILOL 3.125 MG PO TABS: 3.1250 mg | ORAL_TABLET | Freq: Two times a day (BID) | ORAL | Status: DC

## 2014-09-06 MED ORDER — ATORVASTATIN CALCIUM 80 MG PO TABS: 80.0000 mg | ORAL_TABLET | Freq: Every day | ORAL | Status: DC

## 2014-09-06 MED ORDER — ASENAPINE MALEATE 5 MG SL SUBL: 20.0000 mg | SUBLINGUAL_TABLET | Freq: Every day | SUBLINGUAL | Status: DC

## 2014-09-06 NOTE — Telephone Encounter (Signed)
Attempted to contact pt regarding discharge from [facility] on 09/06/14. Left message on pt's vm asking her to call back w/ any questions or concerns regarding discharge instructions and/or medications. Advised her of appt w/ Christell Faith, PA on 09/14/14 @ 1:30. Asked her to call back if she is unable to keep this appt.

## 2014-09-06 NOTE — Telephone Encounter (Signed)
Transitional Care call attempted.  Left message for patient to return call. 

## 2014-09-06 NOTE — Telephone Encounter (Signed)
-----   Message from Arie Sabina sent at 09/06/2014  8:44 AM EDT ----- Regarding: tcm/ph Sharolyn Douglas 09/27/14 @ 1:30

## 2014-09-06 NOTE — Care Management (Signed)
Contacted patient's insurance company.  Brilinta does not require prior auth and copay without coupon will be 45 dollars.  Informed patient

## 2014-09-06 NOTE — Progress Notes (Signed)
Patient Name: Lindsay Fry Date of Encounter: 09/06/2014   Principal Problem:   NSTEMI (non-ST elevated myocardial infarction) Active Problems:   CAD (coronary artery disease)   Essential hypertension   DM (diabetes mellitus)   HYPERCHOLESTEROLEMIA   SUBJECTIVE  No c/p or sob overnight.  Eager to go home.  Ambulating w/o difficulty.  CURRENT MEDS . amphetamine-dextroamphetamine  30 mg Oral BID  . asenapine  20 mg Sublingual QHS  . aspirin EC  81 mg Oral Daily  . atorvastatin  80 mg Oral Daily  . buPROPion  450 mg Oral Daily  . calcium-vitamin D  1 tablet Oral BID  . cholecalciferol  2,000 Units Oral Daily  . cloNIDine  0.05 mg Oral BID  . cyclobenzaprine  20 mg Oral QHS  . enoxaparin (LOVENOX) injection  40 mg Subcutaneous Q24H  . fluticasone  2 spray Each Nare BID  . gabapentin  300 mg Oral TID  . insulin aspart  0-9 Units Subcutaneous TID WC  . lamoTRIgine  400 mg Oral Daily  . latanoprost  1 drop Both Eyes QHS  . letrozole  2.5 mg Oral Daily  . levothyroxine  274 mcg Oral QAC breakfast  . Linaclotide  290 mcg Oral Daily  . linagliptin  5 mg Oral Q breakfast  . mesalamine  1.2 g Oral Q breakfast  . multivitamin with minerals  1 tablet Oral Q supper  . pantoprazole  40 mg Oral BID  . senna-docusate  5 tablet Oral QHS  . sodium chloride  3 mL Intravenous Q12H  . sodium chloride  3 mL Intravenous Q12H  . spironolactone  25 mg Oral BID  . ticagrelor  90 mg Oral BID  . valACYclovir  500 mg Oral BID  . vitamin C  250 mg Oral Daily  . zolpidem  10 mg Oral QHS    OBJECTIVE  Filed Vitals:   09/05/14 1851 09/05/14 2103 09/06/14 0059 09/06/14 0430  BP: 168/94 163/88 144/83 179/84  Pulse: 80 83 83 90  Temp: 97.4 F (36.3 C) 98 F (36.7 C)  97.7 F (36.5 C)  TempSrc: Oral Oral  Oral  Resp: 18 20  18   Height:      Weight:    232 lb 12.8 oz (105.597 kg)  SpO2: 98% 99% 93% 93%    Intake/Output Summary (Last 24 hours) at 09/06/14 0807 Last data filed at  09/06/14 0433  Gross per 24 hour  Intake    560 ml  Output   1000 ml  Net   -440 ml   Filed Weights   09/05/14 0046 09/06/14 0430  Weight: 235 lb (106.595 kg) 232 lb 12.8 oz (105.597 kg)    PHYSICAL EXAM  General: Pleasant, NAD. Neuro: Alert and oriented X 3. Moves all extremities spontaneously. Psych: Normal affect. HEENT:  Normal  Neck: Supple without bruits or JVD. Lungs:  Resp regular and unlabored, CTA. Heart: RRR no s3, s4, or murmurs. Abdomen: Soft, non-tender, non-distended, BS + x 4.  Extremities: No clubbing, cyanosis or edema. DP/PT/Radials 2+ and equal bilaterally. r wrist cath site w/o bleeding/bruit/hematoma.  Accessory Clinical Findings  CBC  Recent Labs  09/05/14 0037  WBC 10.7  NEUTROABS 5.4  HGB 12.3  HCT 39.6  MCV 73.9*  PLT 670*   Basic Metabolic Panel  Recent Labs  09/05/14 0037  NA 139  K 3.5  CL 102  CO2 26  GLUCOSE 168*  BUN 13  CREATININE 0.80  CALCIUM 9.9  Liver Function Tests  Recent Labs  09/05/14 0037  AST 51*  ALT 47  ALKPHOS 86  BILITOT 0.1*  PROT 7.2  ALBUMIN 4.2    Recent Labs  09/05/14 0037  LIPASE 30   Cardiac Enzymes  Recent Labs  09/05/14 0634 09/05/14 1312 09/05/14 1641  TROPONINI 4.41* 16.35* <0.03   TELE  rsr  Radiology/Studies  Dg Chest 2 View  09/05/2014   CLINICAL DATA:  Sternal central chest pain, left arm tingling and tongue numbness for 4 hours.  EXAM: CHEST  2 VIEW  COMPARISON:  07/28/2006  FINDINGS: Lung volumes are low. Evaluation of the lung bases on the AP view partially obscured by soft tissue attenuation from body habitus. The heart is at the upper limits of normal in size. The lungs are clear. Pulmonary vasculature is normal. No consolidation, pleural effusion, or pneumothorax. No acute osseous abnormalities are seen.  IMPRESSION: Borderline cardiomegaly without evidence of acute process.   Electronically Signed   By: Jeb Levering M.D.   On: 09/05/2014 03:09    ASSESSMENT  AND PLAN  1.  NSTEMI/CAD: s/p cath yesterday revealing severe RI dzs, however this was only a 2.24m vessel and was not felt to be amenable to PCI.  She otw had non-obs dzs.  Medical therapy has been instituted including asa, high potency statin, brilinta.  We will consider switching brilinta to plavix after one month if cost prohibitive.  BB has been ordered for d/c.  She is on Adderal, which is contraindicated following MI.  She is also on w/ recommendation for cautious usage in setting of CAD.  She will need to f/u with primary care to look into other options for mgmt of her ADD. She will need f/u w/in 7 day.    2.  Essential HTN: BP trending up on multiple meds - did not get clonidine last night.  Resume prior home meds.  Add bb.  3.  HL: LDL 115 in 2015.  She is on low dose atorva @ home w/ a h/o elevated LFT's when on crestor in the past.  She is currently on lipitor 80 in setting of ACS.  She will need close f/u of lipids and lft's in 6 wks.  4.  Morbid Obesity:  Will benefit from cardiac rehab.  5.  DM II:  Per IM.  On metformin @ home (janumet) - will have to hold for another 24 hrs.  Signed, CMurray HodgkinsNP   Attending Note Patient seen and examined, agree with detailed note above,  Patient presentation and plan discussed on rounds.   Cardiac catheterization yesterday with severe ostial ramus disease, not amenable to stenting. Today feels well with no complaints. Long discussion with her today concerning the need for more aggressive cholesterol management, aggressive diabetes management. She is in agreement. --- Concern for Adderall as above. She is reluctant to take beta blockers and prefers to stay on clonidine. We'll discuss with her again as an outpatient Lipitor dose increased up to 80 mg daily  Signed: TEsmond Plants M.D., Ph.D. [facility]

## 2014-09-06 NOTE — Progress Notes (Signed)
Notified MD Konidena of elevated troponin post cath,  No orders, continue to monitor

## 2014-09-06 NOTE — Progress Notes (Signed)
Pt in NAD, skin warm and dry, pt denies pain or discomfort at this time.  VSS.  IV's and telemetry discontinued per policy and procedure.  Discharge instructions given to and reviewed with patient.  Pt discharged home.

## 2014-09-06 NOTE — Progress Notes (Signed)
Notified Dr Reece Levy of BP of 179/84. One dose of metoprolol ordered.

## 2014-09-07 NOTE — Telephone Encounter (Signed)
Transitional care call attempted.  Left message for patient to return call. 

## 2014-09-07 NOTE — Telephone Encounter (Signed)
Transition Care Management Follow-up Telephone Call   Date discharged? 09/06/14   How have you been since you were released from the hospital? Weak and tired, no new chest pain   Do you understand why you were in the hospital? yes   Do you understand the discharge instructions? yes   Where were you discharged to? Home   Items Reviewed:  Medications reviewed: yes  Allergies reviewed: yes  Dietary changes reviewed: no  Referrals reviewed: yes, cardiology   Functional Questionnaire:   Activities of Daily Living (ADLs):   She states they are independent in the following: ambulation, bathing and hygiene, feeding, continence, grooming, toileting and dressing States they require assistance with the following: None   Any transportation issues/concerns?: no   Any patient concerns? no   Confirmed importance and date/time of follow-up visits scheduled yes, 09/12/14 @ 1200  Provider Appointment booked with [name], MD  Confirmed with patient if condition begins to worsen call PCP or go to the ER.  Patient was given the office number and encouraged to call back with question or concerns.  : yes

## 2014-09-11 NOTE — Discharge Summary (Signed)
Lindsay Fry, is a 54 y.o. female  DOB 08-Apr-1960  MRN [id number].  Admission date:  09/05/2014  Admitting Physician  Juluis Mire, MD  Discharge Date:  09/06/2014   Primary MD  [name], MD  Recommendations for primary care physician for things to follow:   Follow-up with cardiology in 1 week ,follow-up with primary doctor in 1 week   Admission Diagnosis  NSTEMI (non-ST elevated myocardial infarction) [I21.4]   Discharge Diagnosis  NSTEMI (non-ST elevated myocardial infarction) [I21.4]    Principal Problem:   NSTEMI (non-ST elevated myocardial infarction) Active Problems:   HYPERCHOLESTEROLEMIA   Essential hypertension   DM (diabetes mellitus)   CAD (coronary artery disease)      Past Medical History  Diagnosis Date  . Rosacea   . Family history of malignant neoplasm of gastrointestinal tract   . Allergic rhinitis, cause unspecified   . Anxiety state, unspecified   . Unspecified asthma(493.90)   . Benign neoplasm of other and unspecified site of the digestive system   . Ulcerative colitis, unspecified   . Depression   . Fibromyalgia   . GERD (gastroesophageal reflux disease)   . Hiatal hernia   . Pure hypercholesterolemia   . Unspecified essential hypertension   . Unspecified hypothyroidism   . Insomnia, unspecified   . History of migraines   . Other screening mammogram   . Asymptomatic postmenopausal status (age-related) (natural)   . Unspecified vitamin D deficiency   . Edema   . Dysuria   . History of ovarian cyst   . Cyst     on Achilles tendon  . Tubular adenoma of colon 01/10/11  . Vertigo   . Subacute confusional state 11/19/2012  . ADD (attention deficit disorder) 11/19/2012  . OCD (obsessive compulsive disorder)   . Glaucoma   . History of alcoholism   . Bipolar disorder   .  Diabetes mellitus     frank  . Atypical hyperplasia of left breast 2000  . Headache     migraines  . Arthritis   . Cancer     Left Breast 51m invasive CA left uiq, dcis left uoq and a lot ADH, ALH in both breasts.  . Carcinoma of left breast 06/02/2014  . CAD (coronary artery disease)     a. 08/2014 NSTEMI/Cath: LM nl, LAD 50p, D1/D2 min irregs, LCX nl, OM1 40, LPDA nl, RI 99ost (2.055mvessel->med Rx), RCA nl, AM 60, nl EF.  . Marland Kitchenhronic diastolic CHF (congestive heart failure)     a. 08/2014 Echo: EF 60-65%, Gr 1 DD, nl LA.    Past Surgical History  Procedure Laterality Date  . Wisdom tooth extraction  1980's  . Tonsillectomy  1984  . Cesarean section  1996/1997    placenta previa, gest DM, pre-eclampsia  . Nasal sinus surgery  05/1997  . Precancerous mole removed    . Cholecystectomy  1997    adhesions also  . Meckel diverticulum excision  1999  . Dexa  04/1999 and 2010    normal  . Colonoscopy  01/2001    Ulcerative colitis  . Esophagogastroduodenoscopy  09/2001    polyp  . Exercise stress test  11/2003    negative  . Nuclear stress test  06/2007    negative  . Colonoscopy  04/2004    UC, polyp  . Colonoscopy  12/08    UC, no polyps  . Sleep study  11/09    no apnea, but did snore (done  by [facility])  . Bunionectomy Right   . Femur fracture surgery Left   . Appendectomy    . Tubal ligation    . Simple mastectomy with axillary sentinel node biopsy Bilateral 05/23/2014    Procedure: Bilateral simple mastectomy, left sentinel node biopsy ;  Surgeon: Christene Lye, MD;  Location: [facility];  Service: General;  Laterality: Bilateral;  . Axillary sentinel node biopsy Left 05/23/2014    Procedure: AXILLARY SENTINEL NODE BIOPSY;  Surgeon: Christene Lye, MD;  Location: [facility];  Service: General;  Laterality: Left;  Marland Kitchen Mastectomy Bilateral 05-23-14    Dr. Jamal Collin  . Breast biopsy  Aug 2000    on tamoxifen, atypical hyperplasia  . Breast surgery Left Aug 2000     lumpectomy/ Dr Sharlet Salina  . Breast surgery Bilateral 05/23/14    Mastectomy  . Cardiac catheterization N/A 09/05/2014    Procedure: Left Heart Cath and Coronary Angiography;  Surgeon: Wellington Hampshire, MD;  Location: [facility];  Service: Cardiovascular;  Laterality: N/A;       History of present illness and  Hospital Course:     Kindly see H&P for history of present illness and admission details, please review complete Labs, Consult reports and Test reports for all details in brief  HPI  from the history and physical done on the day of admission 54 year old female with hypertension, hypothyroidism, hyperlipidemia, GERD, ulcerative colitis, depression came in because of chest pain. Did not have any dizziness, nausea, diaphoresis. Patient admitted to telemetry for chest pain rule out. Started on heparin drip, beta blockers. Cardiology consult obtained.   Hospital Course   #1 non-ST elevation MI with borderline EKG changes: Initial troponins were negative , following troponin peaked up to 4.41. Patient continued on heparin drip, high-dose statins. Seen by cardiology Dr. Cicero Duck and patient was taken to cardiac catheterization. Cardiac catheterization shows a subtotal ostial occlusion off medium-size rhombus branch, not optimal location for PCI. So cardiology has added billing top for 1 year, increase the dose of statins. Increase atorvastatin to 80 mg daily, continued on other medications. Patient advised to follow-up with cardiology as an outpatient. Patient did not have any further chest pain. Plan #2 and hyperlipidemia #3 diabetes mellitus: Metformin was on hold because of cardiac catheter advised her to regime. History of ulcerative colitis continued on mesalamine and ingests. History of depression, fibromyalgia continued on home medications    Discharge Condition:   Follow UP  Follow-up Information    Follow up with [name], MD. Go on 09/12/2014.   Specialties:  Family  Medicine, Radiology   Why:  at 12:00pm.    Contact information:   [address] [place] [address] [phone]       Follow up with Christell Faith, PA-C On 09/14/2014.   Specialties:  Physician Assistant, Interventional Cardiology, Radiology   Why:  1:30 PM - Dr. Donivan Scull PA.   Contact information:   [place] [address] [phone]         Discharge Instructions  and  Discharge Medications       Medication List    TAKE these medications        amLODipine 10 MG tablet  Commonly known as:  NORVASC  TAKE ONE TABLET BY MOUTH EVERY NIGHT AT BEDTIME     amphetamine-dextroamphetamine 30 MG tablet  Commonly known as:  ADDERALL  Take 30 mg by mouth 2 (two) times daily.  aspirin 81 MG tablet  Take 81 mg by mouth daily.     atorvastatin 80 MG tablet  Commonly known as:  LIPITOR  Take 1 tablet (80 mg total) by mouth daily.     b complex vitamins capsule  Take 1 capsule by mouth daily.     buPROPion 150 MG 24 hr tablet  Commonly known as:  WELLBUTRIN XL  Take 450 mg by mouth daily.     Calcium 600-200 MG-UNIT per tablet  Take 1 tablet by mouth 2 (two) times daily.     chlorproMAZINE 25 MG tablet  Commonly known as:  THORAZINE  Take 25-50 mg by mouth as needed. For migraines if frova doesn't help.     cloNIDine 0.1 MG tablet  Commonly known as:  CATAPRES  TAKE 1 TABLET BY MOUTH TWICE DAILY     co-enzyme Q-10 50 MG capsule  Take 50 mg by mouth daily.     cyclobenzaprine 10 MG tablet  Commonly known as:  FLEXERIL  TAKE 2 TABLETS BY MOUTH EVERY NIGHT AT BEDTIME     diazepam 10 MG tablet  Commonly known as:  VALIUM  Take 2.5-10 mg by mouth as needed for anxiety.     diphenhydrAMINE 50 MG tablet  Commonly known as:  BENADRYL  Take 50 mg by mouth at bedtime as needed (anxiety).     fluticasone 50 MCG/ACT nasal spray  Commonly known as:  FLONASE  USE 2 SPRAYS IN EACH NOSTRIL TWICE DAILY      gabapentin 300 MG capsule  Commonly known as:  NEURONTIN  Take 1 capsule (300 mg total) by mouth 3 (three) times daily.     GAS-X PO  Take 1 tablet by mouth 4 (four) times daily.     glucose blood test strip  Commonly known as:  ONETOUCH VERIO  Use as instructed to check blood sugar twice a day     JANUMET XR [phone] MG Tb24  Generic drug:  SitaGLIPtin-MetFORMIN HCl  TAKE 1 TABLET BY MOUTH DAILY AT DINNER     lamoTRIgine 200 MG tablet  Commonly known as:  LAMICTAL  Take 400 mg by mouth daily.     latanoprost 0.005 % ophthalmic solution  Commonly known as:  XALATAN  Place 1 drop into both eyes At bedtime.     letrozole 2.5 MG tablet  Commonly known as:  FEMARA  Take 1 tablet (2.5 mg total) by mouth daily.     Linaclotide 290 MCG Caps capsule  Commonly known as:  LINZESS  Take 1 capsule (290 mcg total) by mouth daily.     mesalamine 1.2 G EC tablet  Commonly known as:  LIALDA  Take 1 tablet (1.2 g total) by mouth daily with breakfast.     metoprolol tartrate 25 MG tablet  Commonly known as:  LOPRESSOR  Take 1 tablet (25 mg total) by mouth 2 (two) times daily.     multivitamin tablet  Take 1 tablet by mouth daily.     nitrofurantoin (macrocrystal-monohydrate) 100 MG capsule  Commonly known as:  MACROBID  Take 1 capsule by mouth every 12 (twelve) hours.     nitrofurantoin 100 MG capsule  Commonly known as:  MACRODANTIN  Take 100 mg by mouth as directed. With sexual activity     Omega-3 Krill Oil 500 MG Caps  Take 500 mg by mouth.     pantoprazole 40 MG tablet  Commonly known as:  PROTONIX  TAKE 1 TABLET BY MOUTH TWICE DAILY  PROBIOTIC DAILY PO  Take by mouth.     SALONPAS PAIN RELIEF PATCH EX  Apply topically as needed.     SAPHRIS 10 MG Subl  Generic drug:  Asenapine Maleate  Place 20 mg under the tongue at bedtime.     asenapine 5 MG Subl 24 hr tablet  Commonly known as:  SAPHRIS  Place 4 tablets (20 mg total) under the tongue at bedtime.      sennosides-docusate sodium 8.6-50 MG tablet  Commonly known as:  SENOKOT-S  Take 5 tablets by mouth at bedtime.     spironolactone 25 MG tablet  Commonly known as:  ALDACTONE  Take 25 mg by mouth 2 (two) times daily.     SYNTHROID 137 MCG tablet  Generic drug:  levothyroxine  Take 274 mcg by mouth daily before breakfast.     ticagrelor 90 MG Tabs tablet  Commonly known as:  BRILINTA  Take 1 tablet (90 mg total) by mouth 2 (two) times daily.     tiZANidine 4 MG tablet  Commonly known as:  ZANAFLEX  Take 4 mg by mouth. Take 1 by mouth as needed for mirgaine, may repeat after 2 hours if no relief     TYLENOL ARTHRITIS PAIN 650 MG CR tablet  Generic drug:  acetaminophen  Take 1,300 mg by mouth 2 (two) times daily as needed.     valACYclovir 500 MG tablet  Commonly known as:  VALTREX  Take 500 mg by mouth 2 (two) times daily.     vitamin C 500 MG tablet  Commonly known as:  ASCORBIC ACID  Take 250 mg by mouth daily. Takes 2 a day     Vitamin D3 2000 UNITS Tabs  Take 1 tablet by mouth daily.     zolpidem 10 MG tablet  Commonly known as:  AMBIEN  Take 10 mg by mouth at bedtime.          Diet and Activity recommendation: See Discharge Instructions above   Consults obtained -cardiology   Major procedures and Radiology Reports - PLEASE review detailed and final reports for all details, in brief -     Dg Chest 2 View  09/05/2014   CLINICAL DATA:  Sternal central chest pain, left arm tingling and tongue numbness for 4 hours.  EXAM: CHEST  2 VIEW  COMPARISON:  07/28/2006  FINDINGS: Lung volumes are low. Evaluation of the lung bases on the AP view partially obscured by soft tissue attenuation from body habitus. The heart is at the upper limits of normal in size. The lungs are clear. Pulmonary vasculature is normal. No consolidation, pleural effusion, or pneumothorax. No acute osseous abnormalities are seen.  IMPRESSION: Borderline cardiomegaly without evidence of acute  process.   Electronically Signed   By: Jeb Levering M.D.   On: 09/05/2014 03:09    Micro Results    No results found for this or any previous visit (from the past 240 hour(s)).     Today   Subjective:   Lindsay Fry today has no headache,no chest abdominal pain,no new weakness tingling or numbness, feels much better wants to go home today.   Objective:   Blood pressure 179/84, pulse 90, temperature 97.7 F (36.5 C), temperature source Oral, resp. rate 18, height 5' 6"  (1.676 m), weight 105.597 kg (232 lb 12.8 oz), last menstrual period 08/23/2006, SpO2 93 %.  No intake or output data in the 24 hours ending 09/11/14 0751  Exam Awake Alert, Oriented x 3, No  new F.N deficits, Normal affect Arnold.AT,PERRAL Supple Neck,No JVD, No cervical lymphadenopathy appriciated.  Symmetrical Chest wall movement, Good air movement bilaterally, CTAB RRR,No Gallops,Rubs or new Murmurs, No Parasternal Heave +ve B.Sounds, Abd Soft, Non tender, No organomegaly appriciated, No rebound -guarding or rigidity. No Cyanosis, Clubbing or edema, No new Rash or bruise  Data Review   CBC w Diff:  Lab Results  Component Value Date   WBC 10.7 09/05/2014   HGB 12.3 09/05/2014   HCT 39.6 09/05/2014   PLT 554* 09/05/2014   LYMPHOPCT 32 09/05/2014   MONOPCT 9 09/05/2014   EOSPCT 7 09/05/2014   BASOPCT 1 09/05/2014    CMP:  Lab Results  Component Value Date   NA 139 09/05/2014   K 3.5 09/05/2014   CL 102 09/05/2014   CO2 26 09/05/2014   BUN 13 09/05/2014   CREATININE 0.80 09/05/2014   PROT 7.2 09/05/2014   ALBUMIN 4.2 09/05/2014   BILITOT 0.1* 09/05/2014   ALKPHOS 86 09/05/2014   AST 51* 09/05/2014   ALT 47 09/05/2014  .   Total Time in preparing paper work, data evaluation and todays exam - 71 minutes  Xylah Early M.D on 09/06/2014 at 7:51 AM

## 2014-09-12 ENCOUNTER — Encounter: Payer: Self-pay | Admitting: Family Medicine

## 2014-09-12 ENCOUNTER — Other Ambulatory Visit: Payer: Self-pay | Admitting: Family Medicine

## 2014-09-12 ENCOUNTER — Telehealth: Payer: Self-pay | Admitting: Family Medicine

## 2014-09-12 ENCOUNTER — Ambulatory Visit (INDEPENDENT_AMBULATORY_CARE_PROVIDER_SITE_OTHER): Payer: BLUE CROSS/BLUE SHIELD | Admitting: Family Medicine

## 2014-09-12 VITALS — BP 138/84 | HR 106 | Temp 98.2°F | Ht 66.0 in | Wt 236.5 lb

## 2014-09-12 DIAGNOSIS — I25111 Atherosclerotic heart disease of native coronary artery with angina pectoris with documented spasm: Secondary | ICD-10-CM | POA: Diagnosis not present

## 2014-09-12 DIAGNOSIS — E7800 Pure hypercholesterolemia, unspecified: Secondary | ICD-10-CM

## 2014-09-12 DIAGNOSIS — I214 Non-ST elevation (NSTEMI) myocardial infarction: Secondary | ICD-10-CM | POA: Diagnosis not present

## 2014-09-12 DIAGNOSIS — M545 Low back pain, unspecified: Secondary | ICD-10-CM

## 2014-09-12 NOTE — Progress Notes (Signed)
Pre visit review using our clinic review tool, if applicable. No additional management support is needed unless otherwise documented below in the visit note. 

## 2014-09-12 NOTE — Telephone Encounter (Signed)
My favorite plastic surgeon is Dr Dessie Coma in [place] - she can check with that office to see if he does routine breast reconstruction

## 2014-09-12 NOTE — Assessment & Plan Note (Signed)
Intermittent - was better after it "popped" in bed -now worse again  No neuro symptoms  Will continue flexeril prn Update if no further imp  Consider PT once cleared by cardiology

## 2014-09-12 NOTE — Patient Instructions (Signed)
Follow up with cardiology as planned Continue current medicines Blood pressure is improving also   Don't go back to the gym until cardiology clears you  If chest symptoms return (or any other symptoms)- alert [place] and go to ED if needed   Please make a lab appointment for about one month for cholesterol

## 2014-09-12 NOTE — Telephone Encounter (Signed)
Pt notified of Dr. Marliss Coots recommendation

## 2014-09-12 NOTE — Assessment & Plan Note (Signed)
In pt with hx of DM and hyperlipidemia and HTN / obesity and fam hx  1 vessel dz/ med managed  Also limited exercise due to chronic pain issues  Dong well since hosp - tolerating antiplatelet agent and high dose statin  F/u with cardiology on Wed as planned   Will update if any symptoms return in the meantime and seek care

## 2014-09-12 NOTE — Assessment & Plan Note (Signed)
With newly dx NSTEMI/ CAD in pt with mult risk factors Tolerating 80 mg of atorvastatin so far (do not feel her low back pain is related)  Rev low sat fat diet  Planning lab for about a month

## 2014-09-12 NOTE — Telephone Encounter (Signed)
Pt forgot to ask dr tower  Who would dr tower suggest for breast reconstruction  (her surgeon will doing referral) And also can you add NO BP or stick in left arm post mastectomy be added to her chart

## 2014-09-12 NOTE — Progress Notes (Signed)
Subjective:    Patient ID: Lindsay Fry, female    DOB: 1960-09-23, 54 y.o.   MRN: [id number]  HPI Here for hospital f/u (transitional care)   hosp 8/22 to 8/23  Dx with NSTEMI with borderline EKG and peak of 2nd troponin to 4.41 Was tx with heparin/ high dose statin  Cath on 8/22 showed a subtotal ostial occlusion - not amendable to procedure -choose to medically manage  Metformin was re started after procedure  Stable mild DD with EF 60-65%     Chemistry      Component Value Date/Time   NA 139 09/05/2014 0037   K 3.5 09/05/2014 0037   CL 102 09/05/2014 0037   CO2 26 09/05/2014 0037   BUN 13 09/05/2014 0037   CREATININE 0.80 09/05/2014 0037      Component Value Date/Time   CALCIUM 9.9 09/05/2014 0037   ALKPHOS 86 09/05/2014 0037   AST 51* 09/05/2014 0037   ALT 47 09/05/2014 0037   BILITOT 0.1* 09/05/2014 0037      Lab Results  Component Value Date   WBC 10.7 09/05/2014   HGB 12.3 09/05/2014   HCT 39.6 09/05/2014   MCV 73.9* 09/05/2014   PLT 554* 09/05/2014    Lab Results  Component Value Date   TROPONINI <0.03 09/05/2014    Dg Chest 2 View  09/05/2014   CLINICAL DATA:  Sternal central chest pain, left arm tingling and tongue numbness for 4 hours.  EXAM: CHEST  2 VIEW  COMPARISON:  07/28/2006  FINDINGS: Lung volumes are low. Evaluation of the lung bases on the AP view partially obscured by soft tissue attenuation from body habitus. The heart is at the upper limits of normal in size. The lungs are clear. Pulmonary vasculature is normal. No consolidation, pleural effusion, or pneumothorax. No acute osseous abnormalities are seen.  IMPRESSION: Borderline cardiomegaly without evidence of acute process.   Electronically Signed   By: Jeb Levering M.D.   On: 09/05/2014 03:09    Has upcoming cardiol f/u  BP Readings from Last 3 Encounters:  09/12/14 138/84  09/06/14 179/84  08/18/14 126/76     On lipitor 80- seems to tolerate better  Lab Results    Component Value Date   CHOL 193 05/12/2013   HDL 47.60 05/12/2013   LDLCALC 112* 05/12/2013   LDLDIRECT 227.3 06/22/2008   TRIG 169.0* 05/12/2013   CHOLHDL 4 05/12/2013    Lab Results  Component Value Date   HGBA1C 5.4 06/07/2014   Overall starting to do better  Getting energy back a little  Tired easily with walking and also timid -- not back to the gym yet -  Will get clearance from cardiology on Wed  No cp or pressure at all  No pain in arms or neck  Doing much better overall   Has had some low back pain -until she rolled over and back popped  Uses salonpas patch  In hospital was on beta blocker and off clonidine and bp went up  Now getting back to normal    Also on asa 81 mg   MGF and Mcousin- had cardiac events  Muncle - MI   Is on brillinta for antiplatelet agent  No new bruising or bleeding  Patient Active Problem List   Diagnosis Date Noted  . NSTEMI (non-ST elevated myocardial infarction) 09/06/2014  . CAD (coronary artery disease) 09/06/2014  . DM (diabetes mellitus) 09/05/2014  . 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  Carcinoma of left breast 06/02/2014  . Sinus tachycardia 04/27/2014  . Obesity 05/20/2013  . Bladder pain 02/17/2013  . Subacute confusional state 11/19/2012  . ADD (attention deficit disorder) 11/19/2012  . Chest pain 08/05/2012  . Urinary frequency 08/05/2012  . Low back pain 08/05/2012  . 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 Chronic sinusitis 01/24/2012  . Vertigo, benign positional 01/24/2012  . Other screening mammogram 12/05/2010  . Routine general medical examination at a health care facility 10/14/2010  . FATIGUE 08/09/2009  . POSTMENOPAUSAL STATUS 06/22/2008  . UNSPECIFIED VITAMIN D DEFICIENCY 03/18/2008  . BENIGN NEOPLASM [place] SITE DIGESTIVE SYSTEM 01/09/2007  . HIATAL HERNIA 01/09/2007  . COLONIC POLYPS, ADENOMATOUS, HX OF 01/09/2007  . HYPERCHOLESTEROLEMIA 01/07/2007  . Bipolar disorder,  unspecified 01/07/2007  . DEPRESSION 01/07/2007  . Essential hypertension 01/07/2007  . ALLERGIC RHINITIS 01/07/2007  . ASTHMA 01/07/2007  . GERD 01/07/2007  . COLITIS, ULCERATIVE 01/07/2007  . ACNE ROSACEA 01/07/2007  . MIGRAINES, HX OF 01/07/2007  . Fibromyalgia 10/30/2006  . INSOMNIA 10/30/2006   Past Medical History  Diagnosis Date  . Rosacea   . Family history of malignant neoplasm of gastrointestinal tract   . Allergic rhinitis, cause unspecified   . Anxiety state, unspecified   . Unspecified asthma(493.90)   . Benign neoplasm of other and unspecified site of the digestive system   . Ulcerative colitis, unspecified   . Depression   . Fibromyalgia   . GERD (gastroesophageal reflux disease)   . Hiatal hernia   . Pure hypercholesterolemia   . Unspecified essential hypertension   . Unspecified hypothyroidism   . Insomnia, unspecified   . History of migraines   . Other screening mammogram   . Asymptomatic postmenopausal status (age-related) (natural)   . Unspecified vitamin D deficiency   . Edema   . Dysuria   . History of ovarian cyst   . Cyst     on Achilles tendon  . Tubular adenoma of colon 01/10/11  . Vertigo   . Subacute confusional state 11/19/2012  . ADD (attention deficit disorder) 11/19/2012  . OCD (obsessive compulsive disorder)   . Glaucoma   . History of alcoholism   . Bipolar disorder   . Diabetes mellitus     frank  . Atypical hyperplasia of left breast 2000  . Headache     migraines  . Arthritis   . Cancer     Left Breast 15mm invasive CA left uiq, dcis left uoq and a lot ADH, ALH in both breasts.  . Carcinoma of left breast 06/02/2014  . CAD (coronary artery disease)     a. 08/2014 NSTEMI/Cath: LM nl, LAD 50p, D1/D2 min irregs, LCX nl, OM1 40, LPDA nl, RI 99ost (2.75mm vessel->med Rx), RCA nl, AM 60, nl EF.  Marland Kitchen Chronic diastolic CHF (congestive heart failure)     a. 08/2014 Echo: EF 60-65%, Gr 1 DD, nl LA.   Past Surgical History  Procedure  Laterality Date  . Wisdom tooth extraction  1980's  . Tonsillectomy  1984  . Cesarean section  1996/1997    placenta previa, gest DM, pre-eclampsia  . Nasal sinus surgery  05/1997  . Precancerous mole removed    . Cholecystectomy  1997    adhesions also  . Meckel diverticulum excision  1999  . Dexa  04/1999 and 2010    normal  . Colonoscopy  01/2001    Ulcerative colitis  . Esophagogastroduodenoscopy  09/2001    polyp  . Exercise stress test  11/2003    negative  . Nuclear stress test  06/2007    negative  . Colonoscopy  04/2004    UC, polyp  . Colonoscopy  12/08    UC, no polyps  . Sleep study  11/09    no apnea, but did snore (done by [facility])  . Bunionectomy Right   . Femur fracture surgery Left   . Appendectomy    . Tubal ligation    . Simple mastectomy with axillary sentinel node biopsy Bilateral 05/23/2014    Procedure: Bilateral simple mastectomy, left sentinel node biopsy ;  Surgeon: Christene Lye, MD;  Location: [facility];  Service: General;  Laterality: Bilateral;  . Axillary sentinel node biopsy Left 05/23/2014    Procedure: AXILLARY SENTINEL NODE BIOPSY;  Surgeon: Christene Lye, MD;  Location: [facility];  Service: General;  Laterality: Left;  Marland Kitchen Mastectomy Bilateral 05-23-14    Dr. Jamal Collin  . Breast biopsy  Aug 2000    on tamoxifen, atypical hyperplasia  . Breast surgery Left Aug 2000    lumpectomy/ Dr Sharlet Salina  . Breast surgery Bilateral 05/23/14    Mastectomy  . Cardiac catheterization N/A 09/05/2014    Procedure: Left Heart Cath and Coronary Angiography;  Surgeon: Wellington Hampshire, MD;  Location: [place] [facility];  Service: Cardiovascular;  Laterality: N/A;   Social History  Substance Use Topics  . Smoking status: Former Smoker -- 0.25 packs/day for 5 years    Types: Cigarettes    Quit date: 01/14/1993  . Smokeless tobacco: Never Used  . Alcohol Use: No     Comment: Recovered ETOH   Family History  Problem Relation Age of Onset  . Coronary  artery disease Father   . Colon cancer Father   . Alzheimer's disease Father   . Dementia Father   . Coronary artery disease Mother   . Hypertension Mother   . Osteoporosis Mother   . Colitis Mother   . Crohn's disease Mother   . Breast cancer      great aunts  . Coronary artery disease      Uncle (also AAA)  . Diabetes      remote family history  . Stroke Cousin   . Esophageal cancer Neg Hx   . Rectal cancer Neg Hx   . Stomach cancer Neg Hx    Allergies  Allergen Reactions  . Aspirin     REACTION: aggrivates colitis  . Crestor [Rosuvastatin]     REACTION: increased lfts  . Erythromycin     REACTION: GI upset  . Nsaids     REACTION: aggrivate colitis   Current Outpatient Prescriptions on File Prior to Visit  Medication Sig Dispense Refill  . acetaminophen (TYLENOL ARTHRITIS PAIN) 650 MG CR tablet Take 1,300 mg by mouth 2 (two) times daily as needed.     Marland Kitchen amLODipine (NORVASC) 10 MG tablet TAKE ONE TABLET BY MOUTH EVERY NIGHT AT BEDTIME 90 tablet 0  . amphetamine-dextroamphetamine (ADDERALL) 30 MG tablet Take 30 mg by mouth 2 (two) times daily.    . Asenapine Maleate (SAPHRIS) 10 MG SUBL Place 20 mg under the tongue at bedtime.    Marland Kitchen aspirin 81 MG tablet Take 81 mg by mouth daily.    Marland Kitchen atorvastatin (LIPITOR) 80 MG tablet Take 1 tablet (80 mg total) by mouth daily. 30 tablet 0  . b complex vitamins capsule Take 1 capsule by mouth daily.    Marland Kitchen buPROPion (WELLBUTRIN XL) 150 MG 24 hr  tablet Take 450 mg by mouth daily.     . Calcium 600-200 MG-UNIT per tablet Take 1 tablet by mouth 2 (two) times daily. 60 tablet 6  . chlorproMAZINE (THORAZINE) 25 MG tablet Take 25-50 mg by mouth as needed. For migraines if frova doesn't help.    . Cholecalciferol (VITAMIN D3) 2000 UNITS TABS Take 1 tablet by mouth daily.    . cloNIDine (CATAPRES) 0.1 MG tablet TAKE 1 TABLET BY MOUTH TWICE DAILY (Patient taking differently: TAKE 1/2 TABLET BY MOUTH TWICE DAILY) 60 tablet 2  . co-enzyme Q-10 50 MG  capsule Take 50 mg by mouth daily.    . cyclobenzaprine (FLEXERIL) 10 MG tablet TAKE 2 TABLETS BY MOUTH EVERY NIGHT AT BEDTIME 180 tablet 1  . diazepam (VALIUM) 10 MG tablet Take 2.5-10 mg by mouth as needed for anxiety.    . diphenhydrAMINE (BENADRYL) 50 MG tablet Take 100 mg by mouth at bedtime as needed (anxiety).     . fluticasone (FLONASE) 50 MCG/ACT nasal spray USE 2 SPRAYS IN EACH NOSTRIL TWICE DAILY 16 g 5  . gabapentin (NEURONTIN) 300 MG capsule Take 1 capsule (300 mg total) by mouth 3 (three) times daily. 90 capsule 3  . glucose blood (ONETOUCH VERIO) test strip Use as instructed to check blood sugar twice a day 100 each 3  . JANUMET XR [phone] MG TB24 TAKE 1 TABLET BY MOUTH DAILY AT DINNER 30 tablet 3  . lamoTRIgine (LAMICTAL) 200 MG tablet Take 400 mg by mouth daily.    Marland Kitchen latanoprost (XALATAN) 0.005 % ophthalmic solution Place 1 drop into both eyes At bedtime.    Marland Kitchen letrozole (FEMARA) 2.5 MG tablet Take 1 tablet (2.5 mg total) by mouth daily. 30 tablet 6  . Linaclotide (LINZESS) 290 MCG CAPS capsule Take 1 capsule (290 mcg total) by mouth daily. 30 capsule 6  . Liniments (SALONPAS PAIN RELIEF PATCH EX) Apply topically as needed.    . mesalamine (LIALDA) 1.2 G EC tablet Take 1 tablet (1.2 g total) by mouth daily with breakfast. 30 tablet 6  . Multiple Vitamin (MULTIVITAMIN) tablet Take 1 tablet by mouth daily.      . nitrofurantoin (MACRODANTIN) 100 MG capsule Take 100 mg by mouth as directed. With sexual activity     . Omega-3 Krill Oil 500 MG CAPS Take 500 mg by mouth.    . pantoprazole (PROTONIX) 40 MG tablet TAKE 1 TABLET BY MOUTH TWICE DAILY 180 tablet 1  . Probiotic Product (PROBIOTIC DAILY PO) Take by mouth.    . sennosides-docusate sodium (SENOKOT-S) 8.6-50 MG tablet Take 5 tablets by mouth at bedtime.    . Simethicone (GAS-X PO) Take 1 tablet by mouth 4 (four) times daily.    Marland Kitchen spironolactone (ALDACTONE) 25 MG tablet Take 25 mg by mouth 2 (two) times daily.      Marland Kitchen SYNTHROID  137 MCG tablet Take 274 mcg by mouth daily before breakfast.   3  . ticagrelor (BRILINTA) 90 MG TABS tablet Take 1 tablet (90 mg total) by mouth 2 (two) times daily. 60 tablet 0  . tiZANidine (ZANAFLEX) 4 MG tablet Take 4 mg by mouth. Take 1 by mouth as needed for mirgaine, may repeat after 2 hours if no relief    . valACYclovir (VALTREX) 500 MG tablet Take 500 mg by mouth 2 (two) times daily.      . vitamin C (ASCORBIC ACID) 500 MG tablet Take 250 mg by mouth daily. Takes 2 a day    .  zolpidem (AMBIEN) 10 MG tablet Take 10 mg by mouth at bedtime.     No current facility-administered medications on file prior to visit.    Review of Systems    Review of Systems  Constitutional: Negative for fever, appetite change,  and unexpected weight change. Pos for fatigue  Eyes: Negative for pain and visual disturbance.  Respiratory: Negative for cough and shortness of breath.   Cardiovascular: Negative for cp or palpitations   neg for PND or orthopnea  Gastrointestinal: Negative for nausea, diarrhea and constipation.  Genitourinary: Negative for urgency and frequency.  Skin: Negative for pallor or rash  MSK pos for intermittent low back pain and baseline myofascial pain   Neurological: Negative for weakness, light-headedness, numbness and headaches.  Hematological: Negative for adenopathy. Does not bruise/bleed easily.  Psychiatric/Behavioral: Negative for dysphoric mood. The patient is not nervous/anxious.      Objective:   Physical Exam  Constitutional: She appears well-developed and well-nourished. No distress.  obese and well appearing   HENT:  Head: Normocephalic and atraumatic.  Mouth/Throat: Oropharynx is clear and moist.  Eyes: Conjunctivae and EOM are normal. Pupils are equal, round, and reactive to light.  Neck: Normal range of motion. Neck supple. No JVD present. Carotid bruit is not present. No thyromegaly present.  Cardiovascular: Normal rate, regular rhythm, normal heart sounds  and intact distal pulses.  Exam reveals no gallop.   Pulmonary/Chest: Effort normal and breath sounds normal. No respiratory distress. She has no wheezes. She has no rales. She exhibits no tenderness.  No crackles  Abdominal: Soft. Bowel sounds are normal. She exhibits no distension, no abdominal bruit and no mass. There is no tenderness.  Musculoskeletal: She exhibits tenderness. She exhibits no edema.  Limited rom of LS with some tenderness  No leg swelling or tenderness  Lymphadenopathy:    She has no cervical adenopathy.  Neurological: She is alert. She has normal reflexes.  Skin: Skin is warm and dry. No rash noted.  Psychiatric: She has a normal mood and affect.  Seems generally fatigued           Assessment & Plan:   Problem List Items Addressed This Visit    CAD (coronary artery disease) - Primary   HYPERCHOLESTEROLEMIA    With newly dx NSTEMI/ CAD in pt with mult risk factors Tolerating 80 mg of atorvastatin so far (do not feel her low back pain is related)  Rev low sat fat diet  Planning lab for about a month        Relevant Orders   Lipid panel   ALT   AST   Low back pain    Intermittent - was better after it "popped" in bed -now worse again  No neuro symptoms  Will continue flexeril prn Update if no further imp  Consider PT once cleared by cardiology      NSTEMI (non-ST elevated myocardial infarction)    In pt with hx of DM and hyperlipidemia and HTN / obesity and fam hx  1 vessel dz/ med managed  Also limited exercise due to chronic pain issues  Dong well since hosp - tolerating antiplatelet agent and high dose statin  F/u with cardiology on Wed as planned   Will update if any symptoms return in the meantime and seek care

## 2014-09-13 NOTE — Telephone Encounter (Signed)
Electronic refill request, pt was seen yesterday, flexeril last refilled on 03/15/14 #180 with 1 additional refill, please advise

## 2014-09-14 ENCOUNTER — Encounter: Payer: Self-pay | Admitting: Physician Assistant

## 2014-09-14 ENCOUNTER — Ambulatory Visit (INDEPENDENT_AMBULATORY_CARE_PROVIDER_SITE_OTHER): Payer: BLUE CROSS/BLUE SHIELD | Admitting: Physician Assistant

## 2014-09-14 VITALS — BP 120/92 | HR 102 | Ht 66.0 in | Wt 239.8 lb

## 2014-09-14 DIAGNOSIS — I2510 Atherosclerotic heart disease of native coronary artery without angina pectoris: Secondary | ICD-10-CM

## 2014-09-14 DIAGNOSIS — E7800 Pure hypercholesterolemia, unspecified: Secondary | ICD-10-CM

## 2014-09-14 DIAGNOSIS — F988 Other specified behavioral and emotional disorders with onset usually occurring in childhood and adolescence: Secondary | ICD-10-CM

## 2014-09-14 DIAGNOSIS — I739 Peripheral vascular disease, unspecified: Secondary | ICD-10-CM

## 2014-09-14 DIAGNOSIS — M545 Low back pain, unspecified: Secondary | ICD-10-CM

## 2014-09-14 DIAGNOSIS — I214 Non-ST elevation (NSTEMI) myocardial infarction: Secondary | ICD-10-CM

## 2014-09-14 DIAGNOSIS — I471 Supraventricular tachycardia: Secondary | ICD-10-CM

## 2014-09-14 DIAGNOSIS — E669 Obesity, unspecified: Secondary | ICD-10-CM

## 2014-09-14 DIAGNOSIS — F909 Attention-deficit hyperactivity disorder, unspecified type: Secondary | ICD-10-CM

## 2014-09-14 DIAGNOSIS — R000 Tachycardia, unspecified: Secondary | ICD-10-CM

## 2014-09-14 DIAGNOSIS — I10 Essential (primary) hypertension: Secondary | ICD-10-CM

## 2014-09-14 NOTE — Patient Instructions (Addendum)
Medication Instructions:  Please continue your current medications  Labwork: None  Testing/Procedures: Your physician has requested that you have a lower or upper extremity arterial duplex. This test is an ultrasound of the arteries in the legs or arms. It looks at arterial blood flow in the legs and arms. Allow one hour for Lower and Upper Arterial scans. There are no restrictions or special instructions  Follow-Up: 2 months

## 2014-09-14 NOTE — Progress Notes (Signed)
Cardiology Hospital Follow Up Note:   Date of Encounter: 09/14/2014  ID: Lindsay Fry, Alferd Apa Oct 26, 1960, MRN [id number]  PCP: [name], MD Primary Cardiologist: Dr. Fletcher Anon, MD  Chief Complaint  Patient presents with  . Follow-up    "doing well." Pt. c/o shortness of breath and weakness.      HPI:  54 year old female with a recently diagnosed CAD s/p NSTEMI this most recent admission in late August 2016, and known history of hypertension, diabetes mellitus type II, hypothyroidism, hyperlipidemia, GERD, ulcerative colitis, breast cancer s/p mastectomy, and depression who presents for hospital follow up from recent admission to [facility] from 8/22 to 8/23 for the above NSTEMI s/p cardiac cath showing severe vessel CAD not amenable to PCI given location of stenosis (described below), thus treated medically.   She presented around shortly after midnight on 8/22 after developing bilateral elbow pain and later on she started having chest pain and tongue pain hence called EMS and came to the emergency room. Evaluation in the ED revealed stable vital signs and EKGshowed normal sinus rhythm with 0.5-1 mm of ST elevation in 1 and aVL with reciprocal changes. Troponin 0.03-->4.41-->16.35-->0.03. She was taken to cardiac cath lab which showed severe one-vessel disease. The culprit of the MI was subtotal occlusion of the ostial ramus. The vessel was medium size and diameter approximately of 2 mm. The ostial ramus stenosis was in a location where placing a stent might compromise the flow to the LAD or LCx. It was felt performing a PTCA alone would probably not have good long-term results given the diameter of 2 mm. Thus, she was treated medically with aspirin and Brilinta for 12 months and aggressive risk factor modification was advised. Normal LV systolic function and mildly elevated LVEDP. Remaining cath data is per PMH. Echo showed EF 60-65%, WM normal, GR1DD, right side pressure normal.   In follow up  with PCP on 8/29 she was overall doing better. Not back at the gym yet. No chest pain. Noted to have some intermittent low back pain that improved after it "popped" in bed. Wants to get into PT. She has not taken Adderall since her discharge and is doing well off this medication.   Today, she is doing well. She has not had any further chest pain. She does note some SOB that is not associated with exertion. She is ready to get back in the gym though declines cardiac rehab. States she would prefer to go to her gym "[employer]" and work on her own at her own pace. Tolerating Brilinta without issues. Has not missed any doses. She has not taken any metoprolol since her discharge, heart rate has been mildly tachycardic at PCP visit at 106 and 102 today. BP controlled.         Past Medical History  Diagnosis Date  . Rosacea   . Family history of malignant neoplasm of gastrointestinal tract   . Allergic rhinitis, cause unspecified   . Anxiety state, unspecified   . Unspecified asthma(493.90)   . Benign neoplasm of other and unspecified site of the digestive system   . Ulcerative colitis, unspecified   . Depression   . Fibromyalgia   . GERD (gastroesophageal reflux disease)   . Hiatal hernia   . Pure hypercholesterolemia   . Unspecified essential hypertension   . Unspecified hypothyroidism   . Insomnia, unspecified   . History of migraines   . Other screening mammogram   . Asymptomatic postmenopausal status (age-related) (natural)   .  Unspecified vitamin D deficiency   . Edema   . Dysuria   . History of ovarian cyst   . Cyst     on Achilles tendon  . Tubular adenoma of colon 01/10/11  . Vertigo   . Subacute confusional state 11/19/2012  . ADD (attention deficit disorder) 11/19/2012  . OCD (obsessive compulsive disorder)   . Glaucoma   . History of alcoholism   . Bipolar disorder   . Diabetes mellitus     frank  . Atypical hyperplasia of left breast 2000  . Headache      migraines  . Arthritis   . Cancer     Left Breast 23mm invasive CA left uiq, dcis left uoq and a lot ADH, ALH in both breasts.  . Carcinoma of left breast 06/02/2014  . CAD (coronary artery disease)     a. 08/2014 NSTEMI/Cath: LM nl, LAD 50p, D1/D2 min irregs, LCX nl, OM1 40, LPDA nl, RI 99ost (2.23mm vessel->med Rx), RCA nl, AM 60, nl EF.  Marland Kitchen Chronic diastolic CHF (congestive heart failure)     a. 08/2014 Echo: EF 60-65%, Gr 1 DD, nl LA.  : Past Surgical History  Procedure Laterality Date  . Wisdom tooth extraction  1980's  . Tonsillectomy  1984  . Cesarean section  1996/1997    placenta previa, gest DM, pre-eclampsia  . Nasal sinus surgery  05/1997  . Precancerous mole removed    . Cholecystectomy  1997    adhesions also  . Meckel diverticulum excision  1999  . Dexa  04/1999 and 2010    normal  . Colonoscopy  01/2001    Ulcerative colitis  . Esophagogastroduodenoscopy  09/2001    polyp  . Exercise stress test  11/2003    negative  . Nuclear stress test  06/2007    negative  . Colonoscopy  04/2004    UC, polyp  . Colonoscopy  12/08    UC, no polyps  . Sleep study  11/09    no apnea, but did snore (done by [facility])  . Bunionectomy Right   . Femur fracture surgery Left   . Appendectomy    . Tubal ligation    . Simple mastectomy with axillary sentinel node biopsy Bilateral 05/23/2014    Procedure: Bilateral simple mastectomy, left sentinel node biopsy ;  Surgeon: Christene Lye, MD;  Location: [facility] ORS;  Service: General;  Laterality: Bilateral;  . Axillary sentinel node biopsy Left 05/23/2014    Procedure: AXILLARY SENTINEL NODE BIOPSY;  Surgeon: Christene Lye, MD;  Location: [facility] ORS;  Service: General;  Laterality: Left;  Marland Kitchen Mastectomy Bilateral 05-23-14    Dr. Jamal Collin  . Breast biopsy  Aug 2000    on tamoxifen, atypical hyperplasia  . Breast surgery Left Aug 2000    lumpectomy/ Dr Sharlet Salina  . Breast surgery Bilateral 05/23/14    Mastectomy  . Cardiac catheterization  N/A 09/05/2014    Procedure: Left Heart Cath and Coronary Angiography;  Surgeon: Wellington Hampshire, MD;  Location: [facility];  Service: Cardiovascular;  Laterality: N/A;  : Family History  Problem Relation Age of Onset  . Coronary artery disease Father   . Colon cancer Father   . Alzheimer's disease Father   . Dementia Father   . Coronary artery disease Mother   . Hypertension Mother   . Osteoporosis Mother   . Colitis Mother   . Crohn's disease Mother   . Breast cancer  great aunts  . Coronary artery disease      Uncle (also AAA)  . Diabetes      remote family history  . Stroke Cousin   . Esophageal cancer Neg Hx   . Rectal cancer Neg Hx   . Stomach cancer Neg Hx   :  reports that she quit smoking about 21 years ago. Her smoking use included Cigarettes. She has a 1.25 pack-year smoking history. She has never used smokeless tobacco. She reports that she does not drink alcohol or use illicit drugs.:   Allergies:  Allergies  Allergen Reactions  . Aspirin     REACTION: aggrivates colitis  . Crestor [Rosuvastatin]     REACTION: increased lfts  . Erythromycin     REACTION: GI upset  . Nsaids     REACTION: aggrivate colitis     Home Medications:  Current Outpatient Prescriptions  Medication Sig Dispense Refill  . acetaminophen (TYLENOL ARTHRITIS PAIN) 650 MG CR tablet Take 1,300 mg by mouth 2 (two) times daily as needed.     Marland Kitchen amLODipine (NORVASC) 10 MG tablet TAKE 1 TABLET BY MOUTH EVERY NIGHT AT BEDTIME 90 tablet 3  . Asenapine Maleate (SAPHRIS) 10 MG SUBL Place 20 mg under the tongue at bedtime.    Marland Kitchen aspirin 81 MG tablet Take 81 mg by mouth daily.    Marland Kitchen atorvastatin (LIPITOR) 80 MG tablet Take 1 tablet (80 mg total) by mouth daily. 30 tablet 0  . b complex vitamins capsule Take 1 capsule by mouth daily.    Marland Kitchen buPROPion (WELLBUTRIN XL) 150 MG 24 hr tablet Take 450 mg by mouth daily.     . Calcium 600-200 MG-UNIT per tablet Take 1 tablet by mouth 2 (two)  times daily. 60 tablet 6  . chlorproMAZINE (THORAZINE) 25 MG tablet Take 25-50 mg by mouth as needed. For migraines if frova doesn't help.    . Cholecalciferol (VITAMIN D3) 2000 UNITS TABS Take 1 tablet by mouth daily.    . cloNIDine (CATAPRES) 0.1 MG tablet TAKE 1 TABLET BY MOUTH TWICE DAILY (Patient taking differently: TAKE 1/2 TABLET BY MOUTH TWICE DAILY) 60 tablet 2  . co-enzyme Q-10 50 MG capsule Take 50 mg by mouth daily.    . cyclobenzaprine (FLEXERIL) 10 MG tablet TAKE 2 TABLETS BY MOUTH EVERY NIGHT AT BEDTIME 180 tablet 1  . diazepam (VALIUM) 10 MG tablet Take 2.5-10 mg by mouth as needed for anxiety.    . diphenhydrAMINE (BENADRYL) 50 MG tablet Take 100 mg by mouth at bedtime as needed (anxiety).     . fluticasone (FLONASE) 50 MCG/ACT nasal spray USE 2 SPRAYS IN EACH NOSTRIL TWICE DAILY 16 g 5  . gabapentin (NEURONTIN) 300 MG capsule Take 1 capsule (300 mg total) by mouth 3 (three) times daily. 90 capsule 3  . glucose blood (ONETOUCH VERIO) test strip Use as instructed to check blood sugar twice a day 100 each 3  . JANUMET XR [phone] MG TB24 TAKE 1 TABLET BY MOUTH DAILY AT DINNER 30 tablet 3  . lamoTRIgine (LAMICTAL) 200 MG tablet Take 400 mg by mouth daily.    Marland Kitchen latanoprost (XALATAN) 0.005 % ophthalmic solution Place 1 drop into both eyes At bedtime.    Marland Kitchen letrozole (FEMARA) 2.5 MG tablet Take 1 tablet (2.5 mg total) by mouth daily. 30 tablet 6  . Linaclotide (LINZESS) 290 MCG CAPS capsule Take 1 capsule (290 mcg total) by mouth daily. 30 capsule 6  . Liniments (SALONPAS PAIN RELIEF  PATCH EX) Apply topically as needed.    . mesalamine (LIALDA) 1.2 G EC tablet Take 1 tablet (1.2 g total) by mouth daily with breakfast. 30 tablet 6  . Multiple Vitamin (MULTIVITAMIN) tablet Take 1 tablet by mouth daily.      . nitrofurantoin (MACRODANTIN) 100 MG capsule Take 100 mg by mouth as directed. With sexual activity     . Omega-3 Krill Oil 500 MG CAPS Take 500 mg by mouth.    . pantoprazole  (PROTONIX) 40 MG tablet TAKE 1 TABLET BY MOUTH TWICE DAILY 180 tablet 1  . Probiotic Product (PROBIOTIC DAILY PO) Take by mouth.    . sennosides-docusate sodium (SENOKOT-S) 8.6-50 MG tablet Take 5 tablets by mouth at bedtime.    . Simethicone (GAS-X PO) Take 1 tablet by mouth 4 (four) times daily.    Marland Kitchen spironolactone (ALDACTONE) 25 MG tablet Take 25 mg by mouth 2 (two) times daily.      Marland Kitchen SYNTHROID 137 MCG tablet Take 274 mcg by mouth daily before breakfast.   3  . ticagrelor (BRILINTA) 90 MG TABS tablet Take 1 tablet (90 mg total) by mouth 2 (two) times daily. 60 tablet 0  . tiZANidine (ZANAFLEX) 4 MG tablet Take 4 mg by mouth. Take 1 by mouth as needed for mirgaine, may repeat after 2 hours if no relief    . valACYclovir (VALTREX) 500 MG tablet Take 500 mg by mouth 2 (two) times daily.      . vitamin C (ASCORBIC ACID) 500 MG tablet Take 250 mg by mouth daily. Takes 2 a day    . zolpidem (AMBIEN) 10 MG tablet Take 10 mg by mouth at bedtime.    Marland Kitchen amphetamine-dextroamphetamine (ADDERALL) 30 MG tablet Take 30 mg by mouth 2 (two) times daily.     No current facility-administered medications for this visit.     Review of Systems:  Review of Systems  Constitutional: Positive for malaise/fatigue. Negative for fever, chills, weight loss and diaphoresis.  HENT: Positive for congestion.   Eyes: Negative for blurred vision, discharge and redness.  Respiratory: Positive for shortness of breath. Negative for cough, hemoptysis, sputum production and wheezing.   Cardiovascular: Negative for chest pain, palpitations, orthopnea, claudication, leg swelling and PND.  Gastrointestinal: Negative for heartburn, nausea, vomiting, abdominal pain, blood in stool and melena.  Genitourinary: Negative for hematuria.  Musculoskeletal: Positive for myalgias and joint pain. Negative for back pain and falls.  Skin: Negative for rash.  Neurological: Positive for weakness. Negative for sensory change, speech change,  focal weakness and headaches.  Endo/Heme/Allergies: Does not bruise/bleed easily.  Psychiatric/Behavioral: Negative for substance abuse. The patient is not nervous/anxious.   All other systems reviewed and are negative.    Physical Exam:  Blood pressure 120/92, pulse 102, height 5\' 6"  (1.676 m), weight 239 lb 12 oz (108.75 kg), last menstrual period 08/23/2006. BMI: Body mass index is 38.72 kg/(m^2). General: Pleasant, NAD. Psych: Normal affect. Responds to questions with normal affect.  Neuro: Alert and oriented X 3. Moves all extremities spontaneously. HEENT: Normocephalic, atraumatic. EOM intact bilaterally. Sclera anicteric.  Neck: Trachea midline. Supple without bruits or JVD. Lungs:  Respirations regular and unlabored, CTA bilaterally without wheezing, crackles, or rhonchi.  Heart: RRR, normal s3, s4. No murmurs, rubs, or gallops.  Abdomen: Obese, soft, non-tender, non-distended, BS + x 4.  Extremities: No clubbing, cyanosis or edema. DP/PT/Radials 2+ and equal bilaterally.   Accessory Clinical Findings:  EKG: sinus tachycardia, 102 bpm, left axis deviation,  TWI leads V3-V6, nonspecific st/t changes inferior leads   Recent Labs: 07/27/2014: TSH 3.09 09/05/2014: ALT 47; BUN 13; Creatinine, Ser 0.80; Hemoglobin 12.3; Platelets 554*; Potassium 3.5; Sodium 139     Component Value Date/Time   CHOL 193 05/12/2013 0744   TRIG 169.0* 05/12/2013 0744   HDL 47.60 05/12/2013 0744   CHOLHDL 4 05/12/2013 0744   VLDL 33.8 05/12/2013 0744   LDLCALC 112* 05/12/2013 0744   LDLDIRECT 227.3 06/22/2008 0959    Weights: Wt Readings from Last 3 Encounters:  09/14/14 239 lb 12 oz (108.75 kg)  09/12/14 236 lb 8 oz (107.276 kg)  09/06/14 232 lb 12.8 oz (105.597 kg)    Estimated Creatinine Clearance: 98.1 mL/min (by C-G formula based on Cr of 0.8).   Other studies Reviewed: Additional studies/ records that were reviewed today include: [facility] admission.  Assessment & Plan:  1. CAD s/p  recent NSTEMI managed medically:  -No further angina -SOB could be multifactorial including deconditioning vs adverse effect from Brilinta -Patient could take Brilinta with small dose of caffeine  -Continue Brilinta 90 mg bid and aspirin 81 mg daily -Restart Lopressor 25 mg bid -Continue Lipitor 80 mg daily -She declines cardiac rehab at this time prefers to rehab on her own at her gym  2. HTN:  -Controlled -Restart metoprolol 25 mg bid -If BP runs too soft would taper down clonidine and continue beta blocker   3. DM2:  -A1C 5.4% in May 2016 -Per PCP  4. HLD:  -Lipitor 80 mg daily -Future FLP and LFT   5. Obesity:  -Working on eating healthy -Restarting her gym program   6. Low back pain:   -No further symptoms -Plans to restart the gym  7. Chronic leg fatigue:  -Develops with repetitive usage over several days -No claudication symptoms -Check lower extremity dopplers to rule out claudication  -If negative symptoms likely 2/2 patient's fibromyalgia    8. Breast cancer: -Status post mastectomy   9. ADD: -Has not taken Adderall since admission -Continue to hold and follow up with PCP   Dispo: -Follow up as directed   Current medicines are reviewed at length with the patient today.  The patient did not have any concerns regarding medicines.   Christell Faith, PA-C [place],  [phone] [facility] 09/14/2014, 6:28 PM

## 2014-09-20 ENCOUNTER — Other Ambulatory Visit: Payer: Self-pay | Admitting: Psychiatry

## 2014-09-22 ENCOUNTER — Encounter: Payer: Self-pay | Admitting: Family Medicine

## 2014-09-22 ENCOUNTER — Telehealth: Payer: Self-pay

## 2014-09-22 ENCOUNTER — Other Ambulatory Visit: Payer: Self-pay

## 2014-09-22 DIAGNOSIS — C50912 Malignant neoplasm of unspecified site of left female breast: Secondary | ICD-10-CM

## 2014-09-22 NOTE — Telephone Encounter (Signed)
Patient called and has found a plastic surgeon that does breast reconstruction and would like a referral sent to their office. Patient has been referred to Dr Erline Hau, Plastic Surgery. Spoke with  Venita at their office and they will contact the patient to schedule an office visit. Patient's demographics, insurance information, and surgery report has been faxed to their office.

## 2014-09-23 ENCOUNTER — Telehealth: Payer: Self-pay | Admitting: Endocrinology

## 2014-09-23 ENCOUNTER — Other Ambulatory Visit: Payer: Self-pay | Admitting: *Deleted

## 2014-09-23 MED ORDER — SITAGLIP PHOS-METFORMIN HCL ER 100-1000 MG PO TB24: ORAL_TABLET | ORAL | Status: DC

## 2014-09-23 NOTE — Telephone Encounter (Signed)
The Pharmacy originally sent the prescription request to Dr. Hampton Abbot, not [place], I spoke with the patient to let her know and sent in her rx request.

## 2014-09-23 NOTE — Telephone Encounter (Signed)
Pt called said her pharmacy sent a refill request in and we replied back with she was no longer under Dr. Jodelle Green care she says that's not true please call pt [phone]

## 2014-09-27 ENCOUNTER — Telehealth: Payer: Self-pay

## 2014-09-27 ENCOUNTER — Encounter: Payer: BLUE CROSS/BLUE SHIELD | Admitting: Nurse Practitioner

## 2014-09-27 NOTE — Telephone Encounter (Signed)
S/w pt who states she has been "dragging" for the past couple of weeks. Admits she needs full cup of coffee and full cup of tea before she can "function"; states she has not had much caffeine. At 8/31 OV, metoprolol restarted. She thinks this is the cause of her lack of energy She is light-headed if she gets up too quickly States her blood sugars are WNL. Has lost her BP cuff therefore, she is unable to check BP.  Encouraged pt to purchase BP cuff and document readings for a few days and report findings to [place].  Will forward to Christell Faith to make aware.

## 2014-09-27 NOTE — Telephone Encounter (Signed)
Pt states since her medication has been changed she has just "been dragging". States her meds were changed when she had her heart attack 3 weeks ago. States when she sits down she goes to sleep, and always droggy, and no energy. She thinks she is over medicated. Please call.

## 2014-09-28 NOTE — Telephone Encounter (Signed)
Looks like she isn't going to tolerate metoprolol.  Her HR was elevated when seen in clinic and in the setting of recent MI, it would be nice if she was on a BB.  She may stop the metoprolol.  If HR's are still elevated off of BB though, we should consider trying an alternate, such as bisoprolol 2.5 mg daily.  Please assess if she has a way of documenting her HR (checking pulse vs bp cuff vs smartphone app).  Murray Hodgkins, NP 09/28/2014, 12:02 PM

## 2014-09-28 NOTE — Telephone Encounter (Signed)
Reviewed Emeline Gins recommendations with pt who verbalized understanding.  Pt states she will stop metoprolol and monitor HR for one week and report back to [place]. Pt had no further questions.

## 2014-10-06 ENCOUNTER — Ambulatory Visit (INDEPENDENT_AMBULATORY_CARE_PROVIDER_SITE_OTHER): Payer: BLUE CROSS/BLUE SHIELD

## 2014-10-06 ENCOUNTER — Other Ambulatory Visit: Payer: Self-pay

## 2014-10-06 ENCOUNTER — Other Ambulatory Visit: Payer: Self-pay | Admitting: Physician Assistant

## 2014-10-06 DIAGNOSIS — I779 Disorder of arteries and arterioles, unspecified: Secondary | ICD-10-CM

## 2014-10-06 DIAGNOSIS — I739 Peripheral vascular disease, unspecified: Secondary | ICD-10-CM

## 2014-10-06 DIAGNOSIS — I214 Non-ST elevation (NSTEMI) myocardial infarction: Secondary | ICD-10-CM

## 2014-10-06 MED ORDER — TICAGRELOR 90 MG PO TABS: 90.0000 mg | ORAL_TABLET | Freq: Two times a day (BID) | ORAL | Status: DC

## 2014-10-07 ENCOUNTER — Telehealth: Payer: Self-pay | Admitting: Family Medicine

## 2014-10-07 ENCOUNTER — Ambulatory Visit (INDEPENDENT_AMBULATORY_CARE_PROVIDER_SITE_OTHER): Payer: BLUE CROSS/BLUE SHIELD | Admitting: Primary Care

## 2014-10-07 ENCOUNTER — Telehealth: Payer: Self-pay

## 2014-10-07 VITALS — BP 122/88 | HR 90 | Temp 98.9°F | Ht 66.0 in | Wt 232.8 lb

## 2014-10-07 DIAGNOSIS — R42 Dizziness and giddiness: Secondary | ICD-10-CM

## 2014-10-07 NOTE — Telephone Encounter (Signed)
Patient Name: ELEONOR OCON  DOB: 1960-12-01    Initial Comment Caller states she is dizzy. When she got out of the car, she would lean to the left. When she tried to straighten her self up, she would lean to the left again. Feels like she has cotton stuffed in her left ear.   Nurse Assessment  Nurse: Mallie Mussel, RN, Alveta Heimlich Date/Time Eilene Ghazi Time): 10/07/2014 9:32:55 AM  Confirm and document reason for call. If symptomatic, describe symptoms. ---Caller states that she has to hold onto something in order to walk. She states that she leans to the left whenever she stands. She denies pain and fever. She last urinated about 1-2 hours ago.  Has the patient traveled out of the country within the last 30 days? ---No  Does the patient require triage? ---Yes  Related visit to physician within the last 2 weeks? ---No  Does the PT have any chronic conditions? (i.e. diabetes, asthma, etc.) ---Yes  List chronic conditions. ---diabetes, HTN, Hypercholesterolemia, Bilateral Mastectomy, MI last month     Guidelines    Guideline Title Affirmed Question Affirmed Notes  Dizziness - Lightheadedness SEVERE dizziness (e.g., unable to stand, requires support to walk, feels like passing out now)    Final Disposition User   Go to ED Now (or PCP triage) Mallie Mussel, RN, Alveta Heimlich    Comments  No appointments are showing available within the recommended 4 hours. The first one available is 2:00pm. She will not see Dr. Diona Browner. I called the backline to see if something could be worked out. I spoke with Melissa and explained the situation to her. (I just looked at the current time. The 2:00pm appointment is only 15 minutes beyond the 4 hour time recommendation. I spoke with Rena who advises that she had advised Melissa to go ahead and schedule for the 2:00pm appointment time. Lenna Sciara has already entered the appointment.   Referrals  REFERRED TO PCP OFFICE   Disagree/Comply: Comply

## 2014-10-07 NOTE — Telephone Encounter (Signed)
Pt has appt with Allie Bossier NP 10/07/14 at 2 pm.

## 2014-10-07 NOTE — Patient Instructions (Addendum)
Increase consumption of water. You need 2 liters of water daily.  Your ECG is stable.  Hold clonidine blood pressure medication.  Follow up with PCP in 2 weeks for re-evaluation.  It was a pleasure meeting you!

## 2014-10-07 NOTE — Progress Notes (Signed)
Pre visit review using our clinic review tool, if applicable. No additional management support is needed unless otherwise documented below in the visit note. 

## 2014-10-07 NOTE — Telephone Encounter (Signed)
S/w pt of ABI results. Pt reports vertigo when getting out of car this morning. Went to PCP. Reports PA took her off clonidine until OV in 2 weeks w/Dr. Glori Bickers.  Pt asks if she should take metoprolol. Advised pt to monitor BP at least 2x/day and take metoprolol if needed for BP control. Pt to call Monday w/BP readings and further instructions.  Pt verbalized understanding.

## 2014-10-07 NOTE — Progress Notes (Signed)
Subjective:    Patient ID: Lindsay Fry, female    DOB: 11/23/1960, 54 y.o.   MRN: [id number]  HPI  Lindsay Fry is a 54 year old female who presents today with a chief complaint of dizziness. She was getting out of her car this morning at 9 am and noticed a sudden onset of dizziness and slowly leaning towards her left side. She held onto her car and tried to straighten herself. She immediately sat down and did not ever fall.  She has a history of vertigo and is also managed on numerous blood pressure medications. She also reports left ear fullness and felt as though there's cotton in her left ear. She took a dose of OTC dramamine at 11 am and her dizziness has since subsided. She is taking 12.5 mg of metoprolol in the morning and 25 mg, amlodipine, and clonidine. She drinks 2 glasses of water daily. She was recently hospitalized for an MI one month ago. Denies fevers, SOB, chest pain, syncope. Currently she's feeling much better.  Review of Systems  Constitutional: Negative for fever.  HENT: Negative for ear pain and rhinorrhea.        Left ear fullness.  Respiratory: Negative for shortness of breath.   Cardiovascular: Negative for chest pain and palpitations.  Neurological: Positive for dizziness. Negative for syncope, weakness, numbness and headaches.       Past Medical History  Diagnosis Date  . Rosacea   . Family history of malignant neoplasm of gastrointestinal tract   . Allergic rhinitis, cause unspecified   . Anxiety state, unspecified   . Unspecified asthma(493.90)   . Benign neoplasm of other and unspecified site of the digestive system   . Ulcerative colitis, unspecified   . Depression   . Fibromyalgia   . GERD (gastroesophageal reflux disease)   . Hiatal hernia   . Pure hypercholesterolemia   . Unspecified essential hypertension   . Unspecified hypothyroidism   . Insomnia, unspecified   . History of migraines   . Other screening mammogram   . Asymptomatic  postmenopausal status (age-related) (natural)   . Unspecified vitamin D deficiency   . Edema   . Dysuria   . History of ovarian cyst   . Cyst     on Achilles tendon  . Tubular adenoma of colon 01/10/11  . Vertigo   . Subacute confusional state 11/19/2012  . ADD (attention deficit disorder) 11/19/2012  . OCD (obsessive compulsive disorder)   . Glaucoma   . History of alcoholism   . Bipolar disorder   . Diabetes mellitus     frank  . Atypical hyperplasia of left breast 2000  . Headache     migraines  . Arthritis   . Cancer     Left Breast 23mm invasive CA left uiq, dcis left uoq and a lot ADH, ALH in both breasts.  . Carcinoma of left breast 06/02/2014  . CAD (coronary artery disease)     a. 08/2014 NSTEMI/Cath: LM nl, LAD 50p, D1/D2 min irregs, LCX nl, OM1 40, LPDA nl, RI 99ost (2.40mm vessel->med Rx), RCA nl, AM 60, nl EF.  Marland Kitchen Chronic diastolic CHF (congestive heart failure)     a. 08/2014 Echo: EF 60-65%, Gr 1 DD, nl LA.    Social History   Social History  . Marital Status: Married    Spouse Name: alan  . Number of Children: 2  . Years of Education: college   Occupational History  . unemployed  Social History Main Topics  . Smoking status: Former Smoker -- 0.25 packs/day for 5 years    Types: Cigarettes    Quit date: 01/14/1993  . Smokeless tobacco: Never Used  . Alcohol Use: No     Comment: Recovered ETOH  . Drug Use: No  . Sexual Activity: Not Currently   Other Topics Concern  . 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 Wisdom tooth extraction  1980's  . Tonsillectomy  1984  . Cesarean section  1996/1997    placenta previa, gest DM, pre-eclampsia  . Nasal sinus surgery  05/1997  . Precancerous mole removed    . Cholecystectomy  1997    adhesions also  . Meckel  diverticulum excision  1999  . Dexa  04/1999 and 2010    normal  . Colonoscopy  01/2001    Ulcerative colitis  . Esophagogastroduodenoscopy  09/2001    polyp  . Exercise stress test  11/2003    negative  . Nuclear stress test  06/2007    negative  . Colonoscopy  04/2004    UC, polyp  . Colonoscopy  12/08    UC, no polyps  . Sleep study  11/09    no apnea, but did snore (done by [facility])  . Bunionectomy Right   . Femur fracture surgery Left   . Appendectomy    . Tubal ligation    . Simple mastectomy with axillary sentinel node biopsy Bilateral 05/23/2014    Procedure: Bilateral simple mastectomy, left sentinel node biopsy ;  Surgeon: Christene Lye, MD;  Location: [facility];  Service: General;  Laterality: Bilateral;  . Axillary sentinel node biopsy Left 05/23/2014    Procedure: AXILLARY SENTINEL NODE BIOPSY;  Surgeon: Christene Lye, MD;  Location: [facility];  Service: General;  Laterality: Left;  Marland Kitchen Mastectomy Bilateral 05-23-14    Dr. Jamal Collin  . Breast biopsy  Aug 2000    on tamoxifen, atypical hyperplasia  . Breast surgery Left Aug 2000    lumpectomy/ Dr Sharlet Salina  . Breast surgery Bilateral 05/23/14    Mastectomy  . Cardiac catheterization N/A 09/05/2014    Procedure: Left Heart Cath and Coronary Angiography;  Surgeon: Wellington Hampshire, MD;  Location: [place] [facility];  Service: Cardiovascular;  Laterality: N/A;    Family History  Problem Relation Age of Onset  . Coronary artery disease Father   . Colon cancer Father   . Alzheimer's disease Father   . Dementia Father   . Coronary artery disease Mother   . Hypertension Mother   . Osteoporosis Mother   . Colitis Mother   . Crohn's disease Mother   . Breast cancer      great aunts  . Coronary artery disease      Uncle (also AAA)  . Diabetes      remote family history  . Stroke Cousin   . Esophageal cancer Neg Hx   . Rectal cancer Neg Hx   . Stomach cancer Neg Hx     Allergies  Allergen Reactions  .  Aspirin     REACTION: aggrivates colitis  . Crestor [Rosuvastatin]     REACTION: increased lfts  . Erythromycin  REACTION: GI upset  . Nsaids     REACTION: aggrivate colitis    Current Outpatient Prescriptions on File Prior to Visit  Medication Sig Dispense Refill  . amLODipine (NORVASC) 10 MG tablet TAKE 1 TABLET BY MOUTH EVERY NIGHT AT BEDTIME 90 tablet 3  . amphetamine-dextroamphetamine (ADDERALL) 30 MG tablet Take 30 mg by mouth 2 (two) times daily.    . Asenapine Maleate (SAPHRIS) 10 MG SUBL Place 20 mg under the tongue at bedtime.    Marland Kitchen aspirin 81 MG tablet Take 81 mg by mouth daily.    Marland Kitchen atorvastatin (LIPITOR) 80 MG tablet Take 1 tablet (80 mg total) by mouth daily. 30 tablet 0  . b complex vitamins capsule Take 1 capsule by mouth daily.    Marland Kitchen buPROPion (WELLBUTRIN XL) 150 MG 24 hr tablet Take 450 mg by mouth daily.     . Calcium 600-200 MG-UNIT per tablet Take 1 tablet by mouth 2 (two) times daily. 60 tablet 6  . chlorproMAZINE (THORAZINE) 25 MG tablet Take 25-50 mg by mouth as needed. For migraines if frova doesn't help.    . Cholecalciferol (VITAMIN D3) 2000 UNITS TABS Take 1 tablet by mouth daily.    . cloNIDine (CATAPRES) 0.1 MG tablet TAKE 1 TABLET BY MOUTH TWICE DAILY 60 tablet 2  . co-enzyme Q-10 50 MG capsule Take 50 mg by mouth daily.    . cyclobenzaprine (FLEXERIL) 10 MG tablet TAKE 2 TABLETS BY MOUTH EVERY NIGHT AT BEDTIME 180 tablet 1  . diazepam (VALIUM) 10 MG tablet Take 2.5-10 mg by mouth as needed for anxiety.    . diphenhydrAMINE (BENADRYL) 50 MG tablet Take 100 mg by mouth at bedtime as needed (anxiety).     . fluticasone (FLONASE) 50 MCG/ACT nasal spray USE 2 SPRAYS IN EACH NOSTRIL TWICE DAILY 16 g 5  . gabapentin (NEURONTIN) 300 MG capsule Take 1 capsule (300 mg total) by mouth 3 (three) times daily. 90 capsule 3  . glucose blood (ONETOUCH VERIO) test strip Use as instructed to check blood sugar twice a day 100 each 3  . lamoTRIgine (LAMICTAL) 200 MG tablet  Take 400 mg by mouth daily.    Marland Kitchen latanoprost (XALATAN) 0.005 % ophthalmic solution Place 1 drop into both eyes At bedtime.    Marland Kitchen letrozole (FEMARA) 2.5 MG tablet Take 1 tablet (2.5 mg total) by mouth daily. 30 tablet 6  . Linaclotide (LINZESS) 290 MCG CAPS capsule Take 1 capsule (290 mcg total) by mouth daily. 30 capsule 6  . Liniments (SALONPAS PAIN RELIEF PATCH EX) Apply topically as needed.    . mesalamine (LIALDA) 1.2 G EC tablet Take 1 tablet (1.2 g total) by mouth daily with breakfast. 30 tablet 6  . metoprolol tartrate (LOPRESSOR) 25 MG tablet Take 25 mg by mouth 2 (two) times daily. Taking 1/2 tablet in the morning and 1 tablet at bedtime.    . Multiple Vitamin (MULTIVITAMIN) tablet Take 1 tablet by mouth daily.      . nitrofurantoin (MACRODANTIN) 100 MG capsule Take 100 mg by mouth as directed. With sexual activity     . Omega-3 Krill Oil 500 MG CAPS Take 500 mg by mouth.    . pantoprazole (PROTONIX) 40 MG tablet TAKE 1 TABLET BY MOUTH TWICE DAILY 180 tablet 1  . Probiotic Product (PROBIOTIC DAILY PO) Take by mouth.    . sennosides-docusate sodium (SENOKOT-S) 8.6-50 MG tablet Take 5 tablets by mouth at bedtime.    . Simethicone (GAS-X PO) Take  1 tablet by mouth 4 (four) times daily.    . SitaGLIPtin-MetFORMIN HCl (JANUMET XR) [phone] MG TB24 TAKE 1 TABLET BY MOUTH DAILY AT DINNER 30 tablet 3  . spironolactone (ALDACTONE) 25 MG tablet Take 25 mg by mouth 2 (two) times daily.      Marland Kitchen SYNTHROID 137 MCG tablet Take 274 mcg by mouth daily before breakfast.   3  . ticagrelor (BRILINTA) 90 MG TABS tablet Take 1 tablet (90 mg total) by mouth 2 (two) times daily. 180 tablet 3  . tiZANidine (ZANAFLEX) 4 MG tablet Take 4 mg by mouth. Take 1 by mouth as needed for mirgaine, may repeat after 2 hours if no relief    . valACYclovir (VALTREX) 500 MG tablet Take 500 mg by mouth 2 (two) times daily.      . vitamin C (ASCORBIC ACID) 500 MG tablet Take 250 mg by mouth daily. Takes 2 a day    . zolpidem  (AMBIEN) 10 MG tablet Take 10 mg by mouth at bedtime.     No current facility-administered medications on file prior to visit.    BP 122/88 mmHg  Pulse 90  Temp(Src) 98.9 F (37.2 C) (Oral)  Ht 5\' 6"  (1.676 m)  Wt 232 lb 12.8 oz (105.597 kg)  BMI 37.59 kg/m2  SpO2 93%  LMP 08/23/2006 (Approximate)    Objective:   Physical Exam  Constitutional: She is oriented to person, place, and time. She appears well-nourished.  Eyes: EOM are normal. Pupils are equal, round, and reactive to light.  Cardiovascular: Normal rate and regular rhythm.   Pulmonary/Chest: Effort normal and breath sounds normal.  Neurological: She is alert and oriented to person, place, and time. No cranial nerve deficit.  No facial drooping, arm drift, or slurred speech.  Skin: Skin is warm and dry.          Assessment & Plan:  Dizziness:  Sudden onset after rising from vehicle. Orthostatics in office today positive from sitting to standing. Suspect this may be due to numerous BP medications. ECG today stable, no acute changes. Hold clonidine, increase fluids, close follow up with PCP or myself in 2 weeks for re-evaluation of BP and dizziness. HR remains in 90's will keep metoprolol at current dose. ED precautions provided over the weekend.

## 2014-10-13 ENCOUNTER — Other Ambulatory Visit (INDEPENDENT_AMBULATORY_CARE_PROVIDER_SITE_OTHER): Payer: BLUE CROSS/BLUE SHIELD

## 2014-10-13 ENCOUNTER — Encounter: Payer: Self-pay | Admitting: Family Medicine

## 2014-10-13 ENCOUNTER — Other Ambulatory Visit: Payer: BLUE CROSS/BLUE SHIELD

## 2014-10-13 DIAGNOSIS — E7800 Pure hypercholesterolemia, unspecified: Secondary | ICD-10-CM

## 2014-10-13 LAB — ALT: ALT: 42 U/L — ABNORMAL HIGH (ref 0–35)

## 2014-10-13 LAB — LIPID PANEL
Cholesterol: 142 mg/dL (ref 0–200)
HDL: 44.3 mg/dL (ref >39.00)
LDL Cholesterol: 65 mg/dL (ref 0–99)
NonHDL: 97.23
Total CHOL/HDL Ratio: 3
Triglycerides: 163 mg/dL — ABNORMAL HIGH (ref 0.0–149.0)
VLDL: 32.6 mg/dL (ref 0.0–40.0)

## 2014-10-13 LAB — AST: AST: 29 U/L (ref 0–37)

## 2014-10-18 ENCOUNTER — Other Ambulatory Visit: Payer: Self-pay | Admitting: Family Medicine

## 2014-10-19 NOTE — Telephone Encounter (Signed)
Please refill for a year  

## 2014-10-19 NOTE — Telephone Encounter (Signed)
done

## 2014-10-19 NOTE — Telephone Encounter (Signed)
Electronic refill request, pt has f/u scheduled on 10/21/14, last refilled on 05/12/14 #60 with 2 additional refills, please advise

## 2014-10-21 ENCOUNTER — Ambulatory Visit (INDEPENDENT_AMBULATORY_CARE_PROVIDER_SITE_OTHER): Payer: BLUE CROSS/BLUE SHIELD | Admitting: Family Medicine

## 2014-10-21 ENCOUNTER — Encounter: Payer: Self-pay | Admitting: Family Medicine

## 2014-10-21 VITALS — BP 130/80 | HR 101 | Temp 98.5°F | Ht 66.0 in | Wt 233.2 lb

## 2014-10-21 DIAGNOSIS — I10 Essential (primary) hypertension: Secondary | ICD-10-CM | POA: Diagnosis not present

## 2014-10-21 NOTE — Patient Instructions (Signed)
Blood pressure is looking better but heart rate is still a bit high  I wonder if a different beta blocker would be an option (perhaps you would tolerate it) - I will look into that  For now - stay on what you are on  Take care of yourself  If you have any more hypotension -please let me know  Stay hydrated

## 2014-10-21 NOTE — Progress Notes (Signed)
Subjective:    Patient ID: Lindsay Fry, female    DOB: 04/08/1960, 54 y.o.   MRN: [id number]  HPI Here for f/u of HTN   Had a visit with Allie Bossier - and had to cut clonidine due to hypotension  Then bp got up to 144/105 - while sitting and resting  She had to re start it at 1/2 pill bid (.05)   Metoprolol 12.5 bid (could not tolerate higher dose due to sleepiness)   Has cardiol f/u in Nov   BP Readings from Last 3 Encounters:  10/21/14 136/82  10/07/14 122/88  09/14/14 120/92    Pulse is 101  Pt thinks it is from her psych med   Not on adderall any more  No caffeine   Is in cardiac rehab- it is challenging due to her fibromyalgia   Patient Active Problem List   Diagnosis Date Noted  . NSTEMI (non-ST elevated myocardial infarction) ([place]) 09/06/2014  . CAD (coronary artery disease) 09/06/2014  . DM (diabetes mellitus) ([place]) 09/05/2014  . 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 Carcinoma of left breast ([place]) 06/02/2014  . Sinus tachycardia ([place]) 04/27/2014  . Obesity 05/20/2013  . Bladder pain 02/17/2013  . Subacute confusional state 11/19/2012  . ADD (attention deficit disorder) 11/19/2012  . Chest pain 08/05/2012  . Urinary frequency 08/05/2012  . Low back pain 08/05/2012  . 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 Chronic sinusitis 01/24/2012  . Vertigo, benign positional 01/24/2012  . Other screening mammogram 12/05/2010  . Routine general medical examination at a health care facility 10/14/2010  . FATIGUE 08/09/2009  . POSTMENOPAUSAL STATUS 06/22/2008  . UNSPECIFIED VITAMIN D DEFICIENCY 03/18/2008  . BENIGN NEOPLASM [place] SITE DIGESTIVE SYSTEM 01/09/2007  . HIATAL HERNIA 01/09/2007  . COLONIC POLYPS, ADENOMATOUS, HX OF 01/09/2007  . HYPERCHOLESTEROLEMIA 01/07/2007  . Bipolar disorder, unspecified ([place]) 01/07/2007  . DEPRESSION 01/07/2007  . Essential hypertension 01/07/2007  . ALLERGIC RHINITIS 01/07/2007  . ASTHMA  01/07/2007  . GERD 01/07/2007  . COLITIS, ULCERATIVE 01/07/2007  . ACNE ROSACEA 01/07/2007  . MIGRAINES, HX OF 01/07/2007  . Fibromyalgia 10/30/2006  . INSOMNIA 10/30/2006   Past Medical History  Diagnosis Date  . Rosacea   . Family history of malignant neoplasm of gastrointestinal tract   . Allergic rhinitis, cause unspecified   . Anxiety state, unspecified   . Unspecified asthma(493.90)   . Benign neoplasm of other and unspecified site of the digestive system   . Ulcerative colitis, unspecified   . Depression   . Fibromyalgia   . GERD (gastroesophageal reflux disease)   . Hiatal hernia   . Pure hypercholesterolemia   . Unspecified essential hypertension   . Unspecified hypothyroidism   . Insomnia, unspecified   . History of migraines   . Other screening mammogram   . Asymptomatic postmenopausal status (age-related) (natural)   . Unspecified vitamin D deficiency   . Edema   . Dysuria   . History of ovarian cyst   . Cyst     on Achilles tendon  . Tubular adenoma of colon 01/10/11  . Vertigo   . Subacute confusional state 11/19/2012  . ADD (attention deficit disorder) 11/19/2012  . OCD (obsessive compulsive disorder)   . Glaucoma   . History of alcoholism ([place])   . Bipolar disorder ([place])   . Diabetes mellitus ([place])     frank  . Atypical hyperplasia of left breast 2000  . Headache     migraines  .  Arthritis   . Cancer ([place])     Left Breast 81mm invasive CA left uiq, dcis left uoq and a lot ADH, ALH in both breasts.  . Carcinoma of left breast ([place]) 06/02/2014  . CAD (coronary artery disease)     a. 08/2014 NSTEMI/Cath: LM nl, LAD 50p, D1/D2 min irregs, LCX nl, OM1 40, LPDA nl, RI 99ost (2.2mm vessel->med Rx), RCA nl, AM 60, nl EF.  Marland Kitchen Chronic diastolic CHF (congestive heart failure) ([place])     a. 08/2014 Echo: EF 60-65%, Gr 1 DD, nl LA.   Past Surgical History  Procedure Laterality Date  . Wisdom tooth extraction  1980's  . Tonsillectomy  1984  . Cesarean section   1996/1997    placenta previa, gest DM, pre-eclampsia  . Nasal sinus surgery  05/1997  . Precancerous mole removed    . Cholecystectomy  1997    adhesions also  . Meckel diverticulum excision  1999  . Dexa  04/1999 and 2010    normal  . Colonoscopy  01/2001    Ulcerative colitis  . Esophagogastroduodenoscopy  09/2001    polyp  . Exercise stress test  11/2003    negative  . Nuclear stress test  06/2007    negative  . Colonoscopy  04/2004    UC, polyp  . Colonoscopy  12/08    UC, no polyps  . Sleep study  11/09    no apnea, but did snore (done by [facility])  . Bunionectomy Right   . Femur fracture surgery Left   . Appendectomy    . Tubal ligation    . Simple mastectomy with axillary sentinel node biopsy Bilateral 05/23/2014    Procedure: Bilateral simple mastectomy, left sentinel node biopsy ;  Surgeon: Christene Lye, MD;  Location: [facility];  Service: General;  Laterality: Bilateral;  . Axillary sentinel node biopsy Left 05/23/2014    Procedure: AXILLARY SENTINEL NODE BIOPSY;  Surgeon: Christene Lye, MD;  Location: [facility];  Service: General;  Laterality: Left;  Marland Kitchen Mastectomy Bilateral 05-23-14    Dr. Jamal Collin  . Breast biopsy  Aug 2000    on tamoxifen, atypical hyperplasia  . Breast surgery Left Aug 2000    lumpectomy/ Dr Sharlet Salina  . Breast surgery Bilateral 05/23/14    Mastectomy  . Cardiac catheterization N/A 09/05/2014    Procedure: Left Heart Cath and Coronary Angiography;  Surgeon: Wellington Hampshire, MD;  Location: [facility];  Service: Cardiovascular;  Laterality: N/A;   Social History  Substance Use Topics  . Smoking status: Former Smoker -- 0.25 packs/day for 5 years    Types: Cigarettes    Quit date: 01/14/1993  . Smokeless tobacco: Never Used  . Alcohol Use: No     Comment: Recovered ETOH   Family History  Problem Relation Age of Onset  . Coronary artery disease Father   . Colon cancer Father   . Alzheimer's disease Father   . Dementia Father     . Coronary artery disease Mother   . Hypertension Mother   . Osteoporosis Mother   . Colitis Mother   . Crohn's disease Mother   . Breast cancer      great aunts  . Coronary artery disease      Uncle (also AAA)  . Diabetes      remote family history  . Stroke Cousin   . Esophageal cancer Neg Hx   . Rectal cancer Neg Hx   . Stomach cancer  Neg Hx    Allergies  Allergen Reactions  . Aspirin     REACTION: aggrivates colitis  . Crestor [Rosuvastatin]     REACTION: increased lfts  . Erythromycin     REACTION: GI upset  . Nsaids     REACTION: aggrivate colitis   Current Outpatient Prescriptions on File Prior to Visit  Medication Sig Dispense Refill  . amLODipine (NORVASC) 10 MG tablet TAKE 1 TABLET BY MOUTH EVERY NIGHT AT BEDTIME 90 tablet 3  . Asenapine Maleate (SAPHRIS) 10 MG SUBL Place 20 mg under the tongue at bedtime.    Marland Kitchen aspirin 81 MG tablet Take 81 mg by mouth daily.    Marland Kitchen atorvastatin (LIPITOR) 80 MG tablet Take 1 tablet (80 mg total) by mouth daily. 30 tablet 0  . b complex vitamins capsule Take 1 capsule by mouth daily.    Marland Kitchen buPROPion (WELLBUTRIN XL) 150 MG 24 hr tablet Take 450 mg by mouth daily.     . Calcium 600-200 MG-UNIT per tablet Take 1 tablet by mouth 2 (two) times daily. 60 tablet 6  . chlorproMAZINE (THORAZINE) 25 MG tablet Take 25-50 mg by mouth as needed. For migraines if frova doesn't help.    . Cholecalciferol (VITAMIN D3) 2000 UNITS TABS Take 1 tablet by mouth daily.    . cloNIDine (CATAPRES) 0.1 MG tablet TAKE 1 TABLET BY MOUTH TWICE DAILY (Patient taking differently: TAKE 1/2 TABLET BY MOUTH TWICE DAILY) 60 tablet 11  . co-enzyme Q-10 50 MG capsule Take 50 mg by mouth daily.    . cyclobenzaprine (FLEXERIL) 10 MG tablet TAKE 2 TABLETS BY MOUTH EVERY NIGHT AT BEDTIME 180 tablet 1  . diazepam (VALIUM) 10 MG tablet Take 2.5-10 mg by mouth as needed for anxiety.    . diphenhydrAMINE (BENADRYL) 50 MG tablet Take 100 mg by mouth at bedtime as needed  (anxiety).     . fluticasone (FLONASE) 50 MCG/ACT nasal spray USE 2 SPRAYS IN EACH NOSTRIL TWICE DAILY 16 g 5  . gabapentin (NEURONTIN) 300 MG capsule Take 1 capsule (300 mg total) by mouth 3 (three) times daily. 90 capsule 3  . glucose blood (ONETOUCH VERIO) test strip Use as instructed to check blood sugar twice a day 100 each 3  . lamoTRIgine (LAMICTAL) 200 MG tablet Take 400 mg by mouth daily.    Marland Kitchen latanoprost (XALATAN) 0.005 % ophthalmic solution Place 1 drop into both eyes At bedtime.    Marland Kitchen letrozole (FEMARA) 2.5 MG tablet Take 1 tablet (2.5 mg total) by mouth daily. 30 tablet 6  . Linaclotide (LINZESS) 290 MCG CAPS capsule Take 1 capsule (290 mcg total) by mouth daily. 30 capsule 6  . Liniments (SALONPAS PAIN RELIEF PATCH EX) Apply topically as needed.    . mesalamine (LIALDA) 1.2 G EC tablet Take 1 tablet (1.2 g total) by mouth daily with breakfast. 30 tablet 6  . metoprolol tartrate (LOPRESSOR) 25 MG tablet Take 12.5 mg by mouth 2 (two) times daily.     . Multiple Vitamin (MULTIVITAMIN) tablet Take 1 tablet by mouth daily.      . nitrofurantoin (MACRODANTIN) 100 MG capsule Take 100 mg by mouth as directed. With sexual activity     . Omega-3 Krill Oil 500 MG CAPS Take 500 mg by mouth.    . pantoprazole (PROTONIX) 40 MG tablet TAKE 1 TABLET BY MOUTH TWICE DAILY 180 tablet 1  . Probiotic Product (PROBIOTIC DAILY PO) Take by mouth.    . sennosides-docusate sodium (SENOKOT-S)  8.6-50 MG tablet Take 5 tablets by mouth at bedtime.    . Simethicone (GAS-X PO) Take 1 tablet by mouth 4 (four) times daily.    . SitaGLIPtin-MetFORMIN HCl (JANUMET XR) [phone] MG TB24 TAKE 1 TABLET BY MOUTH DAILY AT DINNER 30 tablet 3  . spironolactone (ALDACTONE) 25 MG tablet Take 25 mg by mouth 2 (two) times daily.      Marland Kitchen SYNTHROID 137 MCG tablet Take 274 mcg by mouth daily before breakfast.   3  . ticagrelor (BRILINTA) 90 MG TABS tablet Take 1 tablet (90 mg total) by mouth 2 (two) times daily. 180 tablet 3  .  tiZANidine (ZANAFLEX) 4 MG tablet Take 4 mg by mouth. Take 1 by mouth as needed for mirgaine, may repeat after 2 hours if no relief    . valACYclovir (VALTREX) 500 MG tablet Take 500 mg by mouth 2 (two) times daily.      . vitamin C (ASCORBIC ACID) 500 MG tablet Take 250 mg by mouth daily. Takes 2 a day    . zolpidem (AMBIEN) 10 MG tablet Take 10 mg by mouth at bedtime.     No current facility-administered medications on file prior to visit.     Review of Systems Review of Systems  Constitutional: Negative for fever, appetite change, fatigue and unexpected weight change.  Eyes: Negative for pain and visual disturbance.  Respiratory: Negative for cough and shortness of breath.   Cardiovascular: Negative for cp or palpitations    Gastrointestinal: Negative for nausea, diarrhea and constipation.  Genitourinary: Negative for urgency and frequency.  Skin: Negative for pallor or rash   MSK pos for chronic myofasical pain   Neurological: Negative for weakness, light-headedness, numbness and headaches.  Hematological: Negative for adenopathy. Does not bruise/bleed easily.  Psychiatric/Behavioral: Negative for dysphoric mood. The patient is nervous/anxious.  (pos for labile mood due to bipolar)       Objective:   Physical Exam  Constitutional: She appears well-developed and well-nourished. No distress.  obese and well appearing   HENT:  Head: Normocephalic and atraumatic.  Mouth/Throat: Oropharynx is clear and moist.  Eyes: Conjunctivae and EOM are normal. Pupils are equal, round, and reactive to light.  Neck: Normal range of motion. Neck supple. No JVD present. Carotid bruit is not present. No thyromegaly present.  Cardiovascular: Normal rate, regular rhythm, normal heart sounds and intact distal pulses.  Exam reveals no gallop.   Pulmonary/Chest: Effort normal and breath sounds normal. No respiratory distress. She has no wheezes. She has no rales.  No crackles  Abdominal: Soft. Bowel  sounds are normal. She exhibits no distension, no abdominal bruit and no mass. There is no tenderness.  Musculoskeletal: She exhibits no edema.  Lymphadenopathy:    She has no cervical adenopathy.  Neurological: She is alert. She has normal reflexes.  Skin: Skin is warm and dry. No rash noted.  Psychiatric: Her affect is blunt.          Assessment & Plan:   Problem List Items Addressed This Visit      Cardiovascular and Mediastinum   Essential hypertension - Primary    bp went up after cutting out clonidine - now imp on 1/2 dose Also 1/2 beta blocker (metoprolol)- however pulse rate remains high -pt is intol of higher doses  Will see if her cardiologist recommends a different beta blocker at this time and advise  Currently BP is stable with no orthostatic symptoms  Aware lifestyle change and wt loss would  help BP as well

## 2014-10-21 NOTE — Progress Notes (Signed)
Pre visit review using our clinic review tool, if applicable. No additional management support is needed unless otherwise documented below in the visit note. 

## 2014-10-23 ENCOUNTER — Encounter: Payer: Self-pay | Admitting: Family Medicine

## 2014-10-23 NOTE — Assessment & Plan Note (Signed)
bp went up after cutting out clonidine - now imp on 1/2 dose Also 1/2 beta blocker (metoprolol)- however pulse rate remains high -pt is intol of higher doses  Will see if her cardiologist recommends a different beta blocker at this time and advise  Currently BP is stable with no orthostatic symptoms  Aware lifestyle change and wt loss would help BP as well

## 2014-10-24 ENCOUNTER — Telehealth: Payer: Self-pay | Admitting: Cardiovascular Disease

## 2014-10-24 NOTE — Telephone Encounter (Signed)
Could hold amlodipine Start diltiazem ER 120 mg daily Can we call patient to discuss If BP runs low, would suggest she call

## 2014-10-24 NOTE — Telephone Encounter (Signed)
Pt calling to inquire if Dr. Rockey Situ has recommended another BB for her.

## 2014-10-24 NOTE — Telephone Encounter (Signed)
Essential hypertension - Abner Greenspan, MD at 10/23/2014 11:33 AM          bp went up after cutting out clonidine - now imp on 1/2 dose Also 1/2 beta blocker (metoprolol)- however pulse rate remains high -pt is intol of higher doses  Will see if her cardiologist recommends a different beta blocker at this time and advise  Currently BP is stable with no orthostatic symptoms  Aware lifestyle change and wt loss would help BP as well

## 2014-10-24 NOTE — Telephone Encounter (Signed)
Patient Talked with PCP about changing beta blocker to something that makes her less tired.  Patient is out of meds now on current rx.  What does gollan want to do?    1. Which medications need to be refilled? Beta blocker  2. Which pharmacy is medication to be sent to? [facility] [place]   3. Do they need a 30 day or 90 day supply? 90   4. Would they like a call back once the medication has been sent to the pharmacy? Yes to discuss med changes

## 2014-10-25 MED ORDER — DILTIAZEM HCL ER COATED BEADS 120 MG PO CP24: 120.0000 mg | ORAL_CAPSULE | Freq: Every day | ORAL | Status: DC

## 2014-10-25 NOTE — Telephone Encounter (Signed)
Spoke w/ pt.  Advised her of Dr. Donivan Scull recommendation.  She verbalizes understanding and is agreeable.  Asked her to monitor BP and call back if numbers run low.

## 2014-10-25 NOTE — Telephone Encounter (Signed)
Please let pt know that Dr Rockey Situ suggested holding her amlodipine and trying diltiazem - this will slow her heart rate more and hopefully will not have the side effects of a beta blocker - let me know if agreeable and what pharmacy

## 2014-10-25 NOTE — Telephone Encounter (Signed)
Left message for pt to call back  °

## 2014-10-28 ENCOUNTER — Telehealth: Payer: Self-pay | Admitting: *Deleted

## 2014-10-28 ENCOUNTER — Other Ambulatory Visit: Payer: Self-pay

## 2014-10-28 MED ORDER — ATORVASTATIN CALCIUM 80 MG PO TABS: 80.0000 mg | ORAL_TABLET | Freq: Every day | ORAL | Status: DC

## 2014-10-28 NOTE — Telephone Encounter (Signed)
Pt calling back stating she gave me the wrong weeks bp readings.  Pt c/o BP issue: STAT if pt c/o blurred vision, one-sided weakness or slurred speech  1. What are your last 5 BP readings?  10/25/14 140/98 HR 100 10/26/14 150/105 HR 112 10/27/14 144/8 HR 97 10/28/14 140/99 HR 115  She took all these readings in the afternoon while she was resting watching TV  2. Are you having any other symptoms (ex. Dizziness, headache, blurred vision, passed out)? No   3. What is your BP issue? Since change in medication her BP is running high.

## 2014-10-28 NOTE — Telephone Encounter (Addendum)
Lindsay Fry at 10/28/2014 2:50 PM             Pt calling back stating she gave me the wrong weeks bp readings.  Pt c/o BP issue: STAT if pt c/o blurred vision, one-sided weakness or slurred speech  1. What are your last 5 BP readings?  10/25/14 140/98 HR 100 10/26/14 150/105 HR 112 10/27/14 144/8 HR 97 10/28/14 140/99 HR 115  She took all these readings in the afternoon while she was resting watching TV 2. Are you having any other symptoms (ex. Dizziness, headache, blurred vision, passed out)? No   3. What is your BP issue? Since change in medication her BP is running high.

## 2014-10-28 NOTE — Telephone Encounter (Signed)
Please see previous phone note.  

## 2014-10-28 NOTE — Telephone Encounter (Signed)
Lindsay Fry at 10/28/2014 2:26 PM     Status: Signed       Expand All Collapse All   Pt c/o BP issue: STAT if pt c/o blurred vision, one-sided weakness or slurred speech  1. What are your last 5 BP readings?  10/25/14 144/105 10/26/14 160/100 HR 112 10/27/14 148/98 HR 110 10/28/14 141/96 HR 99  2. Are you having any other symptoms (ex. Dizziness, headache, blurred vision, passed out)? No, just feels her Heart beat is beating fast.   3. What is your BP issue? Just since the change in medication BP is running a bit high She started to take the new medication on 10/24/14

## 2014-10-28 NOTE — Telephone Encounter (Signed)
Pt c/o BP issue: STAT if pt c/o blurred vision, one-sided weakness or slurred speech  1. What are your last 5 BP readings?  10/25/14 144/105 10/26/14 160/100 HR 112 10/27/14 148/98   HR 110 10/28/14 141/96 HR 99  2. Are you having any other symptoms (ex. Dizziness, headache, blurred vision, passed out)? No, just feels her Heart beat is beating fast.   3. What is your BP issue? Just since the change in medication BP is running a bit high She started to take the new medication on 10/24/14

## 2014-10-28 NOTE — Telephone Encounter (Signed)
Refill sent for atorvastatin. 

## 2014-10-30 NOTE — Telephone Encounter (Signed)
Would suggest that if numbers are still the same as last week,  I would increase the diltiazem up to 120 mg twice a day When this runs out, We can change it to 240 mg daily on refill Would keep [place] posted on the numbers

## 2014-10-31 MED ORDER — DILTIAZEM HCL ER COATED BEADS 240 MG PO CP24: 240.0000 mg | ORAL_CAPSULE | Freq: Every day | ORAL | Status: DC

## 2014-10-31 NOTE — Addendum Note (Signed)
Addended by: Dede Query R on: 10/31/2014 11:43 AM   Modules accepted: Orders

## 2014-10-31 NOTE — Telephone Encounter (Signed)
Spoke w/ pt.  Advised her of Dr. Donivan Scull recommendation.  She is agreeable and will call back when she needs a refill.

## 2014-10-31 NOTE — Telephone Encounter (Signed)
Left message for pt to call back  °

## 2014-11-04 ENCOUNTER — Telehealth: Payer: Self-pay

## 2014-11-04 NOTE — Telephone Encounter (Signed)
Pt sates her BP is still running high Pt c/o BP issue: STAT if pt c/o blurred vision, one-sided weakness or slurred speech  1. What are your last 5 BP readings?  10/20- 155/94, HR 111 12 noon 10/19/-193/70, HR 97  7 pm  2. Are you having any other symptoms (ex. Dizziness, headache, blurred vision, passed out)?no, just feel very tired  3. What is your BP issue?  Too high  Pt asks if she needs an appt? Please advise

## 2014-11-07 NOTE — Telephone Encounter (Signed)
Patient returning call from [facility] about bp

## 2014-11-07 NOTE — Telephone Encounter (Signed)
Left message for pt to call back  °

## 2014-11-08 NOTE — Telephone Encounter (Signed)
Pt is sched to see Dr. Rockey Situ on 11/21/14.  Her BP is not cooperating.  Can we work her in if we have a cancellation w/ Dr. Rockey Situ or Ryan/Chris? Thank you!

## 2014-11-11 ENCOUNTER — Ambulatory Visit (INDEPENDENT_AMBULATORY_CARE_PROVIDER_SITE_OTHER): Payer: BLUE CROSS/BLUE SHIELD | Admitting: Cardiovascular Disease

## 2014-11-11 ENCOUNTER — Encounter: Payer: Self-pay | Admitting: Cardiovascular Disease

## 2014-11-11 VITALS — BP 138/98 | HR 110 | Ht 66.5 in | Wt 240.0 lb

## 2014-11-11 DIAGNOSIS — I214 Non-ST elevation (NSTEMI) myocardial infarction: Secondary | ICD-10-CM

## 2014-11-11 DIAGNOSIS — R000 Tachycardia, unspecified: Secondary | ICD-10-CM

## 2014-11-11 DIAGNOSIS — E7800 Pure hypercholesterolemia, unspecified: Secondary | ICD-10-CM | POA: Diagnosis not present

## 2014-11-11 DIAGNOSIS — I10 Essential (primary) hypertension: Secondary | ICD-10-CM

## 2014-11-11 DIAGNOSIS — R0602 Shortness of breath: Secondary | ICD-10-CM

## 2014-11-11 NOTE — Assessment & Plan Note (Signed)
Cholesterol is at goal on the current lipid regimen. No changes to the medications were made.  

## 2014-11-11 NOTE — Assessment & Plan Note (Signed)
Blood pressure is well controlled on today's visit. No changes made to the medications. 

## 2014-11-11 NOTE — Assessment & Plan Note (Signed)
Recent catheterization 09/05/2014 with severe ostial disease of ramus branch  Treated medically  Given small size and ostial location

## 2014-11-11 NOTE — Progress Notes (Signed)
Patient ID: Lindsay Fry, female    DOB: [birth year], 54 y.o.   MRN: [id number]  HPI Comments: Lindsay Fry is a very pleasant 54 year old woman with history of  Morbid obesity, coronary artery disease,  Severe ostial ramus disease treated medically by catheterization August 2016,hypertension, hyperlipidemia,  previous symptoms of chest pain and shortness of breath.  Also with a history of migraines She presents today for follow-up of her hypertension, hyperlipidemia,  Coronary artery disease  history of  Chronic tachycardia   in follow-up today, she reports having shortness of breath with exertion, does not feel well in general..  Has symptoms on her metoprolol of fatigue  Realizes her heart rate is elevated most of time  Difficulty losing weight  Denies any recent chest pain   EKG on today's visit shows sinus tachycardia with rate 110 bpm, left anterior fascicular block  Other past medical history Previously had left arm pain coming on at rest. This seemed to resolve with NSAIDs. Also with several episodes of higher blood pressure, possibly associated with stress. He states that she has been taking clonidine once a day in the evening, amlodipine in the morning  Vertigo symptoms occurred in early 2014 and recurrent August 9. She is taking meclizine. She has dizziness and feels that she is drunk when she stands up.  History of hyperlipidemia. In June 2010, total cholesterol 314.  Remote smoking for a short period of time, none recently.   Allergies  Allergen Reactions  . Aspirin     REACTION: aggrivates colitis  . Crestor [Rosuvastatin]     REACTION: increased lfts  . Erythromycin     REACTION: GI upset  . Nsaids     REACTION: aggrivate colitis    Outpatient Encounter Prescriptions as of 11/11/2014  Medication Sig  . Asenapine Maleate (SAPHRIS) 10 MG SUBL Place 20 mg under the tongue at bedtime.  Marland Kitchen aspirin 81 MG tablet Take 81 mg by mouth daily.  Marland Kitchen atorvastatin (LIPITOR)  80 MG tablet Take 1 tablet (80 mg total) by mouth daily.  Marland Kitchen b complex vitamins capsule Take 1 capsule by mouth daily.  Marland Kitchen buPROPion (WELLBUTRIN XL) 150 MG 24 hr tablet Take 450 mg by mouth daily.   . Calcium 600-200 MG-UNIT per tablet Take 1 tablet by mouth 2 (two) times daily.  . chlorproMAZINE (THORAZINE) 25 MG tablet Take 25-50 mg by mouth as needed. For migraines if frova doesn't help.  . Cholecalciferol (VITAMIN D3) 2000 UNITS TABS Take 1 tablet by mouth daily.  . cloNIDine (CATAPRES) 0.1 MG tablet TAKE 1 TABLET BY MOUTH TWICE DAILY (Patient taking differently: TAKE 1/2 TABLET BY MOUTH TWICE DAILY)  . co-enzyme Q-10 50 MG capsule Take 50 mg by mouth daily.  . cyclobenzaprine (FLEXERIL) 10 MG tablet TAKE 2 TABLETS BY MOUTH EVERY NIGHT AT BEDTIME  . diazepam (VALIUM) 10 MG tablet Take 2.5-10 mg by mouth as needed for anxiety.  Marland Kitchen diltiazem (CARDIZEM CD) 240 MG 24 hr capsule Take 1 capsule (240 mg total) by mouth daily.  . diphenhydrAMINE (BENADRYL) 50 MG tablet Take 100 mg by mouth at bedtime as needed (anxiety).   . fluticasone (FLONASE) 50 MCG/ACT nasal spray USE 2 SPRAYS IN EACH NOSTRIL TWICE DAILY  . gabapentin (NEURONTIN) 300 MG capsule Take 1 capsule (300 mg total) by mouth 3 (three) times daily.  Marland Kitchen glucose blood (ONETOUCH VERIO) test strip Use as instructed to check blood sugar twice a day  . lamoTRIgine (LAMICTAL) 200 MG tablet Take 400  mg by mouth daily.  Marland Kitchen latanoprost (XALATAN) 0.005 % ophthalmic solution Place 1 drop into both eyes At bedtime.  Marland Kitchen letrozole (FEMARA) 2.5 MG tablet Take 1 tablet (2.5 mg total) by mouth daily.  . Linaclotide (LINZESS) 290 MCG CAPS capsule Take 1 capsule (290 mcg total) by mouth daily.  . Liniments (SALONPAS PAIN RELIEF PATCH EX) Apply topically as needed.  . mesalamine (LIALDA) 1.2 G EC tablet Take 1 tablet (1.2 g total) by mouth daily with breakfast.  . Multiple Vitamin (MULTIVITAMIN) tablet Take 1 tablet by mouth daily.    . nitrofurantoin  (MACRODANTIN) 100 MG capsule Take 100 mg by mouth as directed. With sexual activity   . Omega-3 Krill Oil 500 MG CAPS Take 500 mg by mouth.  . pantoprazole (PROTONIX) 40 MG tablet TAKE 1 TABLET BY MOUTH TWICE DAILY  . Probiotic Product (PROBIOTIC DAILY PO) Take by mouth.  . sennosides-docusate sodium (SENOKOT-S) 8.6-50 MG tablet Take 5 tablets by mouth at bedtime.  . Simethicone (GAS-X PO) Take 1 tablet by mouth 4 (four) times daily.  . SitaGLIPtin-MetFORMIN HCl (JANUMET XR) [phone] MG TB24 TAKE 1 TABLET BY MOUTH DAILY AT DINNER  . spironolactone (ALDACTONE) 25 MG tablet Take 25 mg by mouth 2 (two) times daily.    Marland Kitchen SYNTHROID 137 MCG tablet Take 274 mcg by mouth daily before breakfast.   . ticagrelor (BRILINTA) 90 MG TABS tablet Take 1 tablet (90 mg total) by mouth 2 (two) times daily.  Marland Kitchen tiZANidine (ZANAFLEX) 4 MG tablet Take 4 mg by mouth. Take 1 by mouth as needed for mirgaine, may repeat after 2 hours if no relief  . valACYclovir (VALTREX) 500 MG tablet Take 500 mg by mouth 2 (two) times daily.    . vitamin C (ASCORBIC ACID) 500 MG tablet Take 250 mg by mouth daily. Takes 2 a day  . zolpidem (AMBIEN) 10 MG tablet Take 10 mg by mouth at bedtime.  . [DISCONTINUED] metoprolol tartrate (LOPRESSOR) 25 MG tablet Take 12.5 mg by mouth 2 (two) times daily.   . nebivolol (BYSTOLIC) 10 MG tablet Take 1 tablet (10 mg total) by mouth daily.   No facility-administered encounter medications on file as of 11/11/2014.    Past Medical History  Diagnosis Date  . Rosacea   . Family history of malignant neoplasm of gastrointestinal tract   . Allergic rhinitis, cause unspecified   . Anxiety state, unspecified   . Unspecified asthma(493.90)   . Benign neoplasm of other and unspecified site of the digestive system   . Ulcerative colitis, unspecified   . Depression   . Fibromyalgia   . GERD (gastroesophageal reflux disease)   . Hiatal hernia   . Pure hypercholesterolemia   . Unspecified essential  hypertension   . Unspecified hypothyroidism   . Insomnia, unspecified   . History of migraines   . Other screening mammogram   . Asymptomatic postmenopausal status (age-related) (natural)   . Unspecified vitamin D deficiency   . Edema   . Dysuria   . History of ovarian cyst   . Cyst     on Achilles tendon  . Tubular adenoma of colon 01/10/11  . Vertigo   . Subacute confusional state 11/19/2012  . ADD (attention deficit disorder) 11/19/2012  . OCD (obsessive compulsive disorder)   . Glaucoma   . History of alcoholism ([place])   . Bipolar disorder ([place])   . Diabetes mellitus ([place])     frank  . Atypical hyperplasia of left breast 2000  .  Headache     migraines  . Arthritis   . Cancer ([place])     Left Breast 88mm invasive CA left uiq, dcis left uoq and a lot ADH, ALH in both breasts.  . Carcinoma of left breast ([place]) 06/02/2014  . CAD (coronary artery disease)     a. 08/2014 NSTEMI/Cath: LM nl, LAD 50p, D1/D2 min irregs, LCX nl, OM1 40, LPDA nl, RI 99ost (2.56mm vessel->med Rx), RCA nl, AM 60, nl EF.  Marland Kitchen Chronic diastolic CHF (congestive heart failure) ([place])     a. 08/2014 Echo: EF 60-65%, Gr 1 DD, nl LA.    Past Surgical History  Procedure Laterality Date  . Wisdom tooth extraction  1980's  . Tonsillectomy  1984  . Cesarean section  1996/1997    placenta previa, gest DM, pre-eclampsia  . Nasal sinus surgery  05/1997  . Precancerous mole removed    . Cholecystectomy  1997    adhesions also  . Meckel diverticulum excision  1999  . Dexa  04/1999 and 2010    normal  . Colonoscopy  01/2001    Ulcerative colitis  . Esophagogastroduodenoscopy  09/2001    polyp  . Exercise stress test  11/2003    negative  . Nuclear stress test  06/2007    negative  . Colonoscopy  04/2004    UC, polyp  . Colonoscopy  12/08    UC, no polyps  . Sleep study  11/09    no apnea, but did snore (done by [facility])  . Bunionectomy Right   . Femur fracture surgery Left   . Appendectomy    . Tubal ligation     . Simple mastectomy with axillary sentinel node biopsy Bilateral 05/23/2014    Procedure: Bilateral simple mastectomy, left sentinel node biopsy ;  Surgeon: Christene Lye, MD;  Location: [facility] ORS;  Service: General;  Laterality: Bilateral;  . Axillary sentinel node biopsy Left 05/23/2014    Procedure: AXILLARY SENTINEL NODE BIOPSY;  Surgeon: Christene Lye, MD;  Location: [facility] ORS;  Service: General;  Laterality: Left;  Marland Kitchen Mastectomy Bilateral 05-23-14    Dr. Jamal Collin  . Breast biopsy  Aug 2000    on tamoxifen, atypical hyperplasia  . Breast surgery Left Aug 2000    lumpectomy/ Dr Sharlet Salina  . Breast surgery Bilateral 05/23/14    Mastectomy  . Cardiac catheterization N/A 09/05/2014    Procedure: Left Heart Cath and Coronary Angiography;  Surgeon: Wellington Hampshire, MD;  Location: [facility];  Service: Cardiovascular;  Laterality: N/A;    Social History  reports that she quit smoking about 21 years ago. Her smoking use included Cigarettes. She has a 1.25 pack-year smoking history. She has never used smokeless tobacco. She reports that she does not drink alcohol or use illicit drugs.  Family History family history includes Alzheimer's disease in her father; Breast cancer in an other family member; Colitis in her mother; Colon cancer in her father; Coronary artery disease in her father, mother, and another family member; Crohn's disease in her mother; Dementia in her father; Diabetes in an other family member; Hypertension in her mother; Osteoporosis in her mother; Stroke in her cousin. There is no history of Esophageal cancer, Rectal cancer, or Stomach cancer.    Review of Systems  Constitutional: Negative.   Respiratory: Positive for shortness of breath.   Cardiovascular: Negative.   Gastrointestinal: Negative.   Musculoskeletal: Positive for gait problem.  Neurological: Negative.   Hematological: Negative.  Psychiatric/Behavioral: Negative.   All other systems reviewed  and are negative.   BP 138/98 mmHg  Pulse 110  Ht 5' 6.5" (1.689 m)  Wt 240 lb (108.863 kg)  BMI 38.16 kg/m2  LMP 08/23/2006 (Approximate)  Physical Exam  Constitutional: She is oriented to person, place, and time. She appears well-developed and well-nourished.   Morbidly obese  HENT:  Head: Normocephalic.  Nose: Nose normal.  Mouth/Throat: Oropharynx is clear and moist.  Eyes: Conjunctivae are normal. Pupils are equal, round, and reactive to light.  Neck: Normal range of motion. Neck supple. No JVD present.  Cardiovascular: Normal rate, regular rhythm, S1 normal, S2 normal, normal heart sounds and intact distal pulses.  Exam reveals no gallop and no friction rub.   No murmur heard. Pulmonary/Chest: Effort normal and breath sounds normal. No respiratory distress. She has no wheezes. She has no rales. She exhibits no tenderness.  Abdominal: Soft. Bowel sounds are normal. She exhibits no distension. There is no tenderness.  Musculoskeletal: Normal range of motion. She exhibits no edema or tenderness.  Lymphadenopathy:    She has no cervical adenopathy.  Neurological: She is alert and oriented to person, place, and time. Coordination normal.  Skin: Skin is warm and dry. No rash noted. No erythema.  Psychiatric: She has a normal mood and affect. Her behavior is normal. Judgment and thought content normal.    Assessment and Plan  Nursing note and vitals reviewed.

## 2014-11-11 NOTE — Assessment & Plan Note (Signed)
Recommended that she hold her metoprolol.  She is unable to tolerate higher doses for her sinus tachycardia  We will start Bystolic  5 mg with quick titration up to 10 mg daily  Suspect she will need 20 mg daily or 10 twice a day for tachycardia  This will also help her hypertension

## 2014-11-11 NOTE — Patient Instructions (Signed)
You are doing well. Please hold the metoprolol, use only as needed for epsiodes of tachycardia  Start the bystolic 10 mg daily   Please call us if you have new issues that need to be addressed before your next appt.  Your physician wants you to follow-up in: 6 months.  You will receive a reminder letter in the mail two months in advance. If you don't receive a letter, please call our office to schedule the follow-up appointment.

## 2014-11-14 ENCOUNTER — Other Ambulatory Visit: Payer: BLUE CROSS/BLUE SHIELD

## 2014-11-14 ENCOUNTER — Encounter: Payer: Self-pay | Admitting: Family Medicine

## 2014-11-14 ENCOUNTER — Ambulatory Visit (INDEPENDENT_AMBULATORY_CARE_PROVIDER_SITE_OTHER): Payer: BLUE CROSS/BLUE SHIELD | Admitting: Family Medicine

## 2014-11-14 VITALS — BP 138/82 | HR 89 | Temp 98.4°F | Ht 66.5 in | Wt 232.8 lb

## 2014-11-14 DIAGNOSIS — R05 Cough: Secondary | ICD-10-CM

## 2014-11-14 DIAGNOSIS — J209 Acute bronchitis, unspecified: Secondary | ICD-10-CM

## 2014-11-14 DIAGNOSIS — R059 Cough, unspecified: Secondary | ICD-10-CM

## 2014-11-14 MED ORDER — DOXYCYCLINE HYCLATE 100 MG PO TABS: 100.0000 mg | ORAL_TABLET | Freq: Two times a day (BID) | ORAL | Status: DC

## 2014-11-14 NOTE — Patient Instructions (Signed)
Nice to meet you. We will treat you for bronchitis with doxycycline.  If you develop shortness of breath, chest pain, fever, light headedness, blood in your sputum, nausea, vomiting, or any new symptoms please seek medical attention.

## 2014-11-14 NOTE — Progress Notes (Signed)
Pre visit review using our clinic review tool, if applicable. No additional management support is needed unless otherwise documented below in the visit note. 

## 2014-11-15 ENCOUNTER — Telehealth: Payer: Self-pay | Admitting: Family Medicine

## 2014-11-15 DIAGNOSIS — J209 Acute bronchitis, unspecified: Secondary | ICD-10-CM | POA: Insufficient documentation

## 2014-11-15 LAB — CBC
HCT: 43.3 % (ref 36.0–46.0)
Hemoglobin: 14 g/dL (ref 12.0–15.0)
MCHC: 32.4 g/dL (ref 30.0–36.0)
MCV: 84.3 fl (ref 78.0–100.0)
Platelets: 491 10*3/uL — ABNORMAL HIGH (ref 150.0–400.0)
RBC: 5.14 Mil/uL — ABNORMAL HIGH (ref 3.87–5.11)
RDW: 21 % — ABNORMAL HIGH (ref 11.5–15.5)
WBC: 14 10*3/uL — ABNORMAL HIGH (ref 4.0–10.5)

## 2014-11-15 NOTE — Telephone Encounter (Signed)
Called patient to inform him of lab results. There is no answer and I left a message asking her to call back to the office. When she calls back please let her know that her hemoglobin was normal. Her white blood cell count was elevated to 14 and this is consistent with infection. She should continue the antibiotics that we started yesterday. If she has any shortness of breath, chest pain, fevers, continued cough, blood in her sputum, or any new symptoms she should seek medical attention.

## 2014-11-15 NOTE — Progress Notes (Signed)
Patient ID: Lindsay Fry, female   DOB: 24-Apr-1960, 54 y.o.   MRN: [id number]  Tommi Rumps, MD Phone: [phone]  Lindsay Fry is a 54 y.o. female who presents today for same-day visit.  Cough: Patient notes 3-4 days of symptoms starting with nasal congestion and postnasal drip followed by cough. She has some sinus congestion with this though feels like it is mostly in her nose. She has been coughing up green material the majority of the time, though notes on 2-3 occasions there have been small flecks of blood in the sputum. There has not been any gross hemoptysis. There have not been a large number of flecks of blood. She notes she's had similar issues in the past when she has had bronchitis and sinus infections. She does not feel like her symptoms are improving at all. She's not had any chest pain or shortness of breath with this. She's not had any fevers with this. She has felt more tired than typical. She's not on any blood thinners. States in the past she has been treated with antibiotics for similar symptoms. Her son had similar symptoms  PMH: Former smoker.   ROS see history of present illness  Objective  Physical Exam Filed Vitals:   11/14/14 1505  BP: 138/82  Pulse: 89  Temp: 98.4 F (36.9 C)    Physical Exam  Constitutional: She is well-developed, well-nourished, and in no distress.  Nontoxic, appears well  HENT:  Head: Normocephalic and atraumatic.  Right Ear: External ear normal.  Left Ear: External ear normal.  Normal TMs bilaterally, posterior oropharynx with minimal erythema, no exudate, no tonsillar swelling  Eyes: Conjunctivae are normal. Pupils are equal, round, and reactive to light.  Neck: Neck supple.  Cardiovascular: Normal rate, regular rhythm and normal heart sounds.  Exam reveals no gallop and no friction rub.   No murmur heard. Pulmonary/Chest: Effort normal and breath sounds normal. No respiratory distress. She has no wheezes. She has no  rales.  Lymphadenopathy:    She has no cervical adenopathy.  Neurological: She is alert. Gait normal.  Skin: Skin is warm and dry. She is not diaphoretic.     Assessment/Plan: Please see individual problem list.  Acute bronchitis Patient's symptoms most consistent with bronchitis. Could also be related to bacterial sinusitis versus pneumonia. Pneumonia is felt to be less likely given normal lung sounds in addition to stable vitals. Given purulent material on coughing along with flecks of blood I did discuss obtaining a chest x-ray to evaluate further, though the patient declined this opting for treatment with antibiotics. We will cover with doxycycline to cover for possible sinus component and bronchitis/pneumonia component. We'll check a CBC to ensure the patient isn't losing blood. Patient will continue to monitor on the antibiotic and she is given return precautions.    Orders Placed This Encounter  Procedures  . CBC    Meds ordered this encounter  Medications  . doxycycline (VIBRA-TABS) 100 MG tablet    Sig: Take 1 tablet (100 mg total) by mouth 2 (two) times daily.    Dispense:  14 tablet    Refill:  0    Tommi Rumps

## 2014-11-15 NOTE — Assessment & Plan Note (Signed)
Patient's symptoms most consistent with bronchitis. Could also be related to bacterial sinusitis versus pneumonia. Pneumonia is felt to be less likely given normal lung sounds in addition to stable vitals. Given purulent material on coughing along with flecks of blood I did discuss obtaining a chest x-ray to evaluate further, though the patient declined this opting for treatment with antibiotics. We will cover with doxycycline to cover for possible sinus component and bronchitis/pneumonia component. We'll check a CBC to ensure the patient isn't losing blood. Patient will continue to monitor on the antibiotic and she is given return precautions.

## 2014-11-16 ENCOUNTER — Other Ambulatory Visit: Payer: Self-pay | Admitting: Endocrinology

## 2014-11-16 NOTE — Telephone Encounter (Signed)
Called patient, Advised per Dr Ellen Henri note of labs and to continue the ABX prescribed.  Seek medical attention if side effects or any new symptoms.

## 2014-11-18 ENCOUNTER — Ambulatory Visit: Payer: BLUE CROSS/BLUE SHIELD | Admitting: Endocrinology

## 2014-11-21 ENCOUNTER — Ambulatory Visit: Payer: BLUE CROSS/BLUE SHIELD | Admitting: Cardiovascular Disease

## 2014-11-23 ENCOUNTER — Ambulatory Visit (INDEPENDENT_AMBULATORY_CARE_PROVIDER_SITE_OTHER): Payer: BLUE CROSS/BLUE SHIELD | Admitting: General Surgery

## 2014-11-23 ENCOUNTER — Encounter: Payer: Self-pay | Admitting: General Surgery

## 2014-11-23 VITALS — BP 126/82 | HR 70 | Resp 16 | Ht 66.0 in | Wt 237.0 lb

## 2014-11-23 DIAGNOSIS — C50912 Malignant neoplasm of unspecified site of left female breast: Secondary | ICD-10-CM

## 2014-11-23 NOTE — Patient Instructions (Signed)
Follow up in 6 months 

## 2014-11-23 NOTE — Progress Notes (Signed)
Patient ID: Lindsay Fry, female   DOB: 05-30-1960, 54 y.o.   MRN: [id number]  Chief Complaint  Patient presents with  . Follow-up    left breast cancer    HPI Lindsay Fry is a 54 y.o. female.  Here today for follow up breast cancer-s/p bilateral mastectomy. She did have an MI in August but is recovering well. No new issues. Saw plastic surgeon regarding reconstructive surgery, has follow up scheduled for January.  HPI I have reviewed the history of present illness with the patient.  Past Medical History  Diagnosis Date  . Rosacea   . Family history of malignant neoplasm of gastrointestinal tract   . Allergic rhinitis, cause unspecified   . Anxiety state, unspecified   . Unspecified asthma(493.90)   . Benign neoplasm of other and unspecified site of the digestive system   . Ulcerative colitis, unspecified   . Depression   . Fibromyalgia   . GERD (gastroesophageal reflux disease)   . Hiatal hernia   . Pure hypercholesterolemia   . Unspecified essential hypertension   . Unspecified hypothyroidism   . Insomnia, unspecified   . History of migraines   . Other screening mammogram   . Asymptomatic postmenopausal status (age-related) (natural)   . Unspecified vitamin D deficiency   . Edema   . Dysuria   . History of ovarian cyst   . Cyst     on Achilles tendon  . Tubular adenoma of colon 01/10/11  . Vertigo   . Subacute confusional state 11/19/2012  . ADD (attention deficit disorder) 11/19/2012  . OCD (obsessive compulsive disorder)   . Glaucoma   . History of alcoholism ([place])   . Bipolar disorder ([place])   . Diabetes mellitus ([place])     Lindsay Fry  . Atypical hyperplasia of left breast 2000  . Headache     migraines  . Arthritis   . Cancer ([place])     Left Breast 76mm invasive CA left uiq, dcis left uoq and a lot ADH, ALH in both breasts.  . Carcinoma of left breast ([place]) 06/02/2014  . CAD (coronary artery disease)     a. 08/2014 NSTEMI/Cath: LM nl, LAD 50p, D1/D2 min  irregs, LCX nl, OM1 40, LPDA nl, RI 99ost (2.83mm vessel->med Rx), RCA nl, AM 60, nl EF.  Marland Kitchen Chronic diastolic CHF (congestive heart failure) ([place])     a. 08/2014 Echo: EF 60-65%, Gr 1 DD, nl LA.  Marland Kitchen Myocardial infarction [facility]) 09-04-14    Past Surgical History  Procedure Laterality Date  . Wisdom tooth extraction  1980's  . Tonsillectomy  1984  . Cesarean section  1996/1997    placenta previa, gest DM, pre-eclampsia  . Nasal sinus surgery  05/1997  . Precancerous mole removed    . Cholecystectomy  1997    adhesions also  . Meckel diverticulum excision  1999  . Dexa  04/1999 and 2010    normal  . Colonoscopy  01/2001    Ulcerative colitis  . Esophagogastroduodenoscopy  09/2001    polyp  . Exercise stress test  11/2003    negative  . Nuclear stress test  06/2007    negative  . Colonoscopy  04/2004    UC, polyp  . Colonoscopy  12/08    UC, no polyps  . Sleep study  11/09    no apnea, but did snore (done by [facility])  . Bunionectomy Right   . Femur fracture surgery Left   . Appendectomy    .  Tubal ligation    . Simple mastectomy with axillary sentinel node biopsy Bilateral 05/23/2014    Procedure: Bilateral simple mastectomy, left sentinel node biopsy ;  Surgeon: Christene Lye, MD;  Location: [facility] ORS;  Service: General;  Laterality: Bilateral;  . Axillary sentinel node biopsy Left 05/23/2014    Procedure: AXILLARY SENTINEL NODE BIOPSY;  Surgeon: Christene Lye, MD;  Location: [facility] ORS;  Service: General;  Laterality: Left;  Marland Kitchen Mastectomy Bilateral 05-23-14    Dr. Jamal Collin  . Breast biopsy  Aug 2000    on tamoxifen, atypical hyperplasia  . Breast surgery Left Aug 2000    lumpectomy/ Dr Sharlet Salina  . Breast surgery Bilateral 05/23/14    Mastectomy  . Cardiac catheterization N/A 09/05/2014    Procedure: Left Heart Cath and Coronary Angiography;  Surgeon: Wellington Hampshire, MD;  Location: [place] [facility];  Service: Cardiovascular;  Laterality: N/A;  . Cardiac catheterization   09-05-14    Family History  Problem Relation Age of Onset  . Coronary artery disease Father   . Colon cancer Father   . Alzheimer's disease Father   . Dementia Father   . Coronary artery disease Mother   . Hypertension Mother   . Osteoporosis Mother   . Colitis Mother   . Crohn's disease Mother   . Breast cancer      great aunts  . Coronary artery disease      Uncle (also AAA)  . Diabetes      remote family history  . Stroke Cousin   . Esophageal cancer Neg Hx   . Rectal cancer Neg Hx   . Stomach cancer Neg Hx     Social History Social History  Substance Use Topics  . Smoking status: Former Smoker -- 0.25 packs/day for 5 years    Types: Cigarettes    Quit date: 01/14/1993  . Smokeless tobacco: Never Used  . Alcohol Use: No     Comment: Recovered ETOH    Allergies  Allergen Reactions  . Aspirin     REACTION: aggrivates colitis  . Crestor [Rosuvastatin]     REACTION: increased lfts  . Erythromycin     REACTION: GI upset  . Nsaids     REACTION: aggrivate colitis  . Oxycodone Anxiety    Current Outpatient Prescriptions  Medication Sig Dispense Refill  . Asenapine Maleate (SAPHRIS) 10 MG SUBL Place 20 mg under the tongue at bedtime.    Marland Kitchen aspirin 81 MG tablet Take 81 mg by mouth daily.    Marland Kitchen atorvastatin (LIPITOR) 80 MG tablet Take 1 tablet (80 mg total) by mouth daily. 30 tablet 0  . b complex vitamins capsule Take 1 capsule by mouth daily.    Marland Kitchen buPROPion (WELLBUTRIN XL) 150 MG 24 hr tablet Take 450 mg by mouth daily.     . Calcium 600-200 MG-UNIT per tablet Take 1 tablet by mouth 2 (two) times daily. 60 tablet 6  . chlorproMAZINE (THORAZINE) 25 MG tablet Take 25-50 mg by mouth as needed. For migraines if frova doesn't help.    . Cholecalciferol (VITAMIN D3) 2000 UNITS TABS Take 1 tablet by mouth daily.    . cloNIDine (CATAPRES) 0.1 MG tablet TAKE 1 TABLET BY MOUTH TWICE DAILY (Patient taking differently: TAKE 1/2 TABLET BY MOUTH TWICE DAILY) 60 tablet 11  .  co-enzyme Q-10 50 MG capsule Take 50 mg by mouth daily.    . cyclobenzaprine (FLEXERIL) 10 MG tablet TAKE 2 TABLETS BY MOUTH EVERY NIGHT  AT BEDTIME 180 tablet 1  . diazepam (VALIUM) 10 MG tablet Take 2.5-10 mg by mouth as needed for anxiety.    Marland Kitchen diltiazem (CARDIZEM CD) 240 MG 24 hr capsule Take 1 capsule (240 mg total) by mouth daily. 90 capsule 3  . diphenhydrAMINE (BENADRYL) 50 MG tablet Take 100 mg by mouth at bedtime as needed (anxiety).     Marland Kitchen doxycycline (VIBRA-TABS) 100 MG tablet Take 1 tablet (100 mg total) by mouth 2 (two) times daily. 14 tablet 0  . fluticasone (FLONASE) 50 MCG/ACT nasal spray USE 2 SPRAYS IN EACH NOSTRIL TWICE DAILY 16 g 5  . gabapentin (NEURONTIN) 300 MG capsule Take 1 capsule (300 mg total) by mouth 3 (three) times daily. 90 capsule 3  . glucose blood (ONETOUCH VERIO) test strip Use as instructed to check blood sugar twice a day 100 each 3  . lamoTRIgine (LAMICTAL) 200 MG tablet Take 400 mg by mouth daily.    Marland Kitchen latanoprost (XALATAN) 0.005 % ophthalmic solution Place 1 drop into both eyes At bedtime.    Marland Kitchen letrozole (FEMARA) 2.5 MG tablet Take 1 tablet (2.5 mg total) by mouth daily. 30 tablet 6  . Linaclotide (LINZESS) 290 MCG CAPS capsule Take 1 capsule (290 mcg total) by mouth daily. 30 capsule 6  . Liniments (SALONPAS PAIN RELIEF PATCH EX) Apply topically as needed.    . mesalamine (LIALDA) 1.2 G EC tablet Take 1 tablet (1.2 g total) by mouth daily with breakfast. 30 tablet 6  . Multiple Vitamin (MULTIVITAMIN) tablet Take 1 tablet by mouth daily.      . nebivolol (BYSTOLIC) 10 MG tablet Take 10 mg by mouth 2 (two) times daily.     . nitrofurantoin (MACRODANTIN) 100 MG capsule Take 100 mg by mouth as directed. With sexual activity     . Omega-3 Krill Oil 500 MG CAPS Take 500 mg by mouth.    . pantoprazole (PROTONIX) 40 MG tablet TAKE 1 TABLET BY MOUTH TWICE DAILY 180 tablet 1  . Probiotic Product (PROBIOTIC DAILY PO) Take by mouth.    . sennosides-docusate sodium  (SENOKOT-S) 8.6-50 MG tablet Take 5 tablets by mouth at bedtime.    . Simethicone (GAS-X PO) Take 1 tablet by mouth 4 (four) times daily.    . SitaGLIPtin-MetFORMIN HCl (JANUMET XR) [phone] MG TB24 TAKE 1 TABLET BY MOUTH DAILY AT DINNER 30 tablet 3  . spironolactone (ALDACTONE) 25 MG tablet Take 25 mg by mouth 2 (two) times daily.      Marland Kitchen SYNTHROID 137 MCG tablet Take 274 mcg by mouth daily before breakfast.   3  . ticagrelor (BRILINTA) 90 MG TABS tablet Take 1 tablet (90 mg total) by mouth 2 (two) times daily. 180 tablet 3  . tiZANidine (ZANAFLEX) 4 MG tablet Take 4 mg by mouth. Take 1 by mouth as needed for mirgaine, may repeat after 2 hours if no relief    . valACYclovir (VALTREX) 500 MG tablet Take 500 mg by mouth 2 (two) times daily.      . vitamin C (ASCORBIC ACID) 500 MG tablet Take 250 mg by mouth daily. Takes 2 a day    . zolpidem (AMBIEN) 10 MG tablet Take 10 mg by mouth at bedtime.     No current facility-administered medications for this visit.    Review of Systems Review of Systems  Constitutional: Negative.   Respiratory: Negative.   Cardiovascular: Negative.     Blood pressure 126/82, pulse 70, resp. rate 16, height 5'  6" (1.676 m), weight 237 lb (107.502 kg), last menstrual period 08/23/2006.  Physical Exam Physical Exam  Constitutional: She is oriented to person, place, and time. She appears well-developed and well-nourished.  HENT:  Mouth/Throat: Oropharynx is clear and moist.  Eyes: Conjunctivae are normal. No scleral icterus.  Neck: Neck supple.  Cardiovascular: Normal rate, regular rhythm and normal heart sounds.   Pulmonary/Chest: Effort normal and breath sounds normal.  B/l mastectomy sites well healed, no signs of local recurrence.   Abdominal: Soft. Normal appearance and bowel sounds are normal. There is no hepatomegaly. There is no tenderness.  Lymphadenopathy:    She has no cervical adenopathy.    She has no axillary adenopathy.  Neurological: She is  alert and oriented to person, place, and time.  Skin: Skin is warm and dry.  Psychiatric: Her behavior is normal.    Data Reviewed Notes reviewed  Assessment    Left breast cancer, s/p b/l mastectomies. Doing well on letrozole    Plan    Patient to return in 6 months for follow up. Continue letrozole.      PCP:  [name], MD           Forest Gleason, M.D.  Evona Westra G 11/23/2014, 2:38 PM

## 2014-11-28 ENCOUNTER — Other Ambulatory Visit (INDEPENDENT_AMBULATORY_CARE_PROVIDER_SITE_OTHER): Payer: BLUE CROSS/BLUE SHIELD

## 2014-11-28 DIAGNOSIS — E119 Type 2 diabetes mellitus without complications: Secondary | ICD-10-CM | POA: Diagnosis not present

## 2014-11-28 LAB — COMPREHENSIVE METABOLIC PANEL
ALT: 41 U/L — ABNORMAL HIGH (ref 0–35)
AST: 28 U/L (ref 0–37)
Albumin: 4.3 g/dL (ref 3.5–5.2)
Alkaline Phosphatase: 99 U/L (ref 39–117)
BUN: 14 mg/dL (ref 6–23)
CO2: 26 mEq/L (ref 19–32)
Calcium: 9.7 mg/dL (ref 8.4–10.5)
Chloride: 102 mEq/L (ref 96–112)
Creatinine, Ser: 0.89 mg/dL (ref 0.40–1.20)
GFR: 69.64 mL/min (ref >60.00)
Glucose, Bld: 218 mg/dL — ABNORMAL HIGH (ref 70–99)
Potassium: 4.5 mEq/L (ref 3.5–5.1)
Sodium: 139 mEq/L (ref 135–145)
Total Bilirubin: 0.3 mg/dL (ref 0.2–1.2)
Total Protein: 7.2 g/dL (ref 6.0–8.3)

## 2014-11-28 LAB — MICROALBUMIN / CREATININE URINE RATIO
Creatinine,U: 100.3 mg/dL
Microalb Creat Ratio: 17.2 mg/g (ref 0.0–30.0)
Microalb, Ur: 17.3 mg/dL — ABNORMAL HIGH (ref 0.0–1.9)

## 2014-11-28 LAB — LIPID PANEL
Cholesterol: 154 mg/dL (ref 0–200)
HDL: 39.4 mg/dL (ref >39.00)
NonHDL: 114.41
Total CHOL/HDL Ratio: 4
Triglycerides: 235 mg/dL — ABNORMAL HIGH (ref 0.0–149.0)
VLDL: 47 mg/dL — ABNORMAL HIGH (ref 0.0–40.0)

## 2014-11-28 LAB — LDL CHOLESTEROL, DIRECT: Direct LDL: 94 mg/dL

## 2014-11-28 LAB — HEMOGLOBIN A1C: Hgb A1c MFr Bld: 7.3 % — ABNORMAL HIGH (ref 4.6–6.5)

## 2014-11-29 ENCOUNTER — Other Ambulatory Visit: Payer: Self-pay | Admitting: Cardiovascular Disease

## 2014-12-01 ENCOUNTER — Telehealth: Payer: Self-pay | Admitting: *Deleted

## 2014-12-01 MED ORDER — NEBIVOLOL HCL 10 MG PO TABS: 10.0000 mg | ORAL_TABLET | Freq: Two times a day (BID) | ORAL | Status: DC

## 2014-12-01 NOTE — Telephone Encounter (Signed)
*  STAT* If patient is at the pharmacy, call can be transferred to refill team.   1. Which medications need to be refilled? (please list name of each medication and dose if known) nebivolol (BYSTOLIC) 10 MG tablet   2. Which pharmacy/location (including street and city if local pharmacy) is medication to be sent to? [place]  3. Do they need a 30 day or 90 day supply? 90 day supply   Bystolic 2x is working great!!

## 2014-12-01 NOTE — Telephone Encounter (Signed)
Pt's Rx was sent to pt's pharmacy as requested. Confirmation received

## 2014-12-02 ENCOUNTER — Encounter: Payer: Self-pay | Admitting: Endocrinology

## 2014-12-02 ENCOUNTER — Ambulatory Visit (INDEPENDENT_AMBULATORY_CARE_PROVIDER_SITE_OTHER): Payer: BLUE CROSS/BLUE SHIELD | Admitting: Endocrinology

## 2014-12-02 VITALS — BP 148/98 | HR 97 | Temp 98.2°F | Resp 16 | Ht 66.0 in | Wt 241.2 lb

## 2014-12-02 DIAGNOSIS — E1165 Type 2 diabetes mellitus with hyperglycemia: Secondary | ICD-10-CM

## 2014-12-02 DIAGNOSIS — E890 Postprocedural hypothyroidism: Secondary | ICD-10-CM | POA: Diagnosis not present

## 2014-12-02 MED ORDER — METFORMIN HCL ER 500 MG PO TB24: 1000.0000 mg | ORAL_TABLET | Freq: Every day | ORAL | Status: DC

## 2014-12-02 MED ORDER — VICTOZA 18 MG/3ML ~~LOC~~ SOPN: 1.2000 mg | PEN_INJECTOR | Freq: Every day | SUBCUTANEOUS | Status: DC

## 2014-12-02 NOTE — Progress Notes (Signed)
Patient ID: Lindsay Fry, female   DOB: 1960/06/10, 54 y.o.   MRN: [id number]   Reason for Appointment:  Hypothyroidism and diabetes, followup visit  History of Present Illness:  DIABETES: With her high A1c of 6.7 in 03/2014 she had an abnormal glucose tolerance test indicating diabetes and 1 hour glucose of 300 She was started on metformin; however even with 1500 mg of metformin ER her blood sugars were not controlled especially fasting She is now taking Janumet XR 100/1000 daily  Although initially she started improving with her treatment and change in lifestyle and her A1c had gone down to 5.4 her glucose readings appear to be higher now She says this is from stress and also has gained weight Probably not controlling portions Not exercising recently, recently after her MI she has only been able to walk 5-10 minutes at a time  Recent glucose levels from monitor download: Range 143-167, checking mostly in the morning and midday Has only 4 readings recently  Her A1c is significantly high at 7.3 on lab glucose was over 200 nonfasting  Weight history:  Wt Readings from Last 3 Encounters:  12/02/14 241 lb 3.2 oz (109.408 kg)  11/23/14 237 lb (107.502 kg)  11/14/14 232 lb 12.8 oz (105.597 kg)   LABS:  Lab Results  Component Value Date   HGBA1C 7.3* 11/28/2014   HGBA1C 5.4 06/07/2014   HGBA1C 6.7* 02/17/2014   Lab Results  Component Value Date   MICROALBUR 17.3* 11/28/2014   LDLCALC 65 10/13/2014   CREATININE 0.89 11/28/2014     HYPOTHYROIDISM: This was first diagnosed in 1992 after her treatment for Graves' disease with I-131 She has been on relatively large doses of thyroxine supplements She was on 224 mcg since 11/13 and her TSH was normal in 3/14 Subjectively difficult to assess her thyroid since she tends to have fatigue chronically.  In 2014 her dose was reduced to 200 mcg daily and her dose has been fluctuating since then She has generally required  periodic increase in dosage including in 05/2014 when her TSH was 20  She is now on 2 tablets of 137 g daily, continues on the brand name Synthroid  Her symptoms are variable and usually nonspecific for hypothyroidism, mostly related to other medical problems including sleep disturbances and bipolar illness.   She has been quite regular with the Synthroid in the mornings without any interfering substances         TSH is normal as of 7/16   Lab Results  Component Value Date   TSH 3.09 07/27/2014   TSH 20.09* 06/07/2014   TSH 1.21 04/14/2014   FREET4 1.18 07/27/2014   FREET4 0.99 02/17/2014   FREET4 1.02 07/29/2013     Lab on 11/28/2014  Component Date Value Ref Range Status  . Hgb A1c MFr Bld 11/28/2014 7.3* 4.6 - 6.5 % Final   Glycemic Control Guidelines for People with Diabetes:Non Diabetic:  <6%Goal of Therapy: <7%Additional Action Suggested:  >8%   . Sodium 11/28/2014 139  135 - 145 mEq/L Final  . Potassium 11/28/2014 4.5  3.5 - 5.1 mEq/L Final  . Chloride 11/28/2014 102  96 - 112 mEq/L Final  . CO2 11/28/2014 26  19 - 32 mEq/L Final  . Glucose, Bld 11/28/2014 218* 70 - 99 mg/dL Final  . BUN 11/28/2014 14  6 - 23 mg/dL Final  . Creatinine, Ser 11/28/2014 0.89  0.40 - 1.20 mg/dL Final  . Total Bilirubin 11/28/2014 0.3  0.2 -  1.2 mg/dL Final  . Alkaline Phosphatase 11/28/2014 99  39 - 117 U/L Final  . AST 11/28/2014 28  0 - 37 U/L Final  . ALT 11/28/2014 41* 0 - 35 U/L Final  . Total Protein 11/28/2014 7.2  6.0 - 8.3 g/dL Final  . Albumin 11/28/2014 4.3  3.5 - 5.2 g/dL Final  . Calcium 11/28/2014 9.7  8.4 - 10.5 mg/dL Final  . GFR 11/28/2014 69.64  >60.00 mL/min Final  . Cholesterol 11/28/2014 154  0 - 200 mg/dL Final   ATP III Classification       Desirable:  < 200 mg/dL               Borderline High:  200 - 239 mg/dL          High:  > = 240 mg/dL  . Triglycerides 11/28/2014 235.0* 0.0 - 149.0 mg/dL Final   Normal:  <150 mg/dLBorderline High:  150 - 199 mg/dL  . HDL  11/28/2014 39.40  >39.00 mg/dL Final  . VLDL 11/28/2014 47.0* 0.0 - 40.0 mg/dL Final  . Total CHOL/HDL Ratio 11/28/2014 4   Final                  Men          Women1/2 Average Risk     3.4          3.3Average Risk          5.0          4.42X Average Risk          9.6          7.13X Average Risk          15.0          11.0                      . NonHDL 11/28/2014 114.41   Final   NOTE:  Non-HDL goal should be 30 mg/dL higher than patient's LDL goal (i.e. LDL goal of < 70 mg/dL, would have non-HDL goal of < 100 mg/dL)  . Microalb, Ur 11/28/2014 17.3* 0.0 - 1.9 mg/dL Final  . Creatinine,U 11/28/2014 100.3   Final  . Microalb Creat Ratio 11/28/2014 17.2  0.0 - 30.0 mg/g Final  . Direct LDL 11/28/2014 94.0   Final   Optimal:  <100 mg/dLNear or Above Optimal:  100-129 mg/dLBorderline High:  130-159 mg/dLHigh:  160-189 mg/dLVery High:  >190 mg/dL      Medication List       This list is accurate as of: 12/02/14 11:59 PM.  Always use your most recent med list.               aspirin 81 MG tablet  Take 81 mg by mouth daily.     atorvastatin 80 MG tablet  Commonly known as:  LIPITOR  TAKE 1 TABLET(80 MG) BY MOUTH DAILY     b complex vitamins capsule  Take 1 capsule by mouth daily.     buPROPion 150 MG 24 hr tablet  Commonly known as:  WELLBUTRIN XL  Take 450 mg by mouth daily.     Calcium 600-200 MG-UNIT tablet  Take 1 tablet by mouth 2 (two) times daily.     chlorproMAZINE 25 MG tablet  Commonly known as:  THORAZINE  Take 25-50 mg by mouth as needed. For migraines if frova doesn't help.     cloNIDine 0.1 MG tablet  Commonly known as:  CATAPRES  TAKE 1 TABLET BY MOUTH TWICE DAILY     co-enzyme Q-10 50 MG capsule  Take 50 mg by mouth daily.     cyclobenzaprine 10 MG tablet  Commonly known as:  FLEXERIL  TAKE 2 TABLETS BY MOUTH EVERY NIGHT AT BEDTIME     diazepam 10 MG tablet  Commonly known as:  VALIUM  Take 2.5-10 mg by mouth as needed for anxiety.     diltiazem 240 MG  24 hr capsule  Commonly known as:  CARDIZEM CD  Take 1 capsule (240 mg total) by mouth daily.     diphenhydrAMINE 50 MG tablet  Commonly known as:  BENADRYL  Take 100 mg by mouth at bedtime as needed (anxiety).     doxycycline 100 MG tablet  Commonly known as:  VIBRA-TABS  Take 1 tablet (100 mg total) by mouth 2 (two) times daily.     fluticasone 50 MCG/ACT nasal spray  Commonly known as:  FLONASE  USE 2 SPRAYS IN EACH NOSTRIL TWICE DAILY     gabapentin 300 MG capsule  Commonly known as:  NEURONTIN  Take 1 capsule (300 mg total) by mouth 3 (three) times daily.     GAS-X PO  Take 1 tablet by mouth 4 (four) times daily.     glucose blood test strip  Commonly known as:  ONETOUCH VERIO  Use as instructed to check blood sugar twice a day     Insulin Pen Needle 31G X 5 MM Misc  Use once a day with Victoza pen     lamoTRIgine 200 MG tablet  Commonly known as:  LAMICTAL  Take 400 mg by mouth daily.     latanoprost 0.005 % ophthalmic solution  Commonly known as:  XALATAN  Place 1 drop into both eyes At bedtime.     letrozole 2.5 MG tablet  Commonly known as:  FEMARA  Take 1 tablet (2.5 mg total) by mouth daily.     Linaclotide 290 MCG Caps capsule  Commonly known as:  LINZESS  Take 1 capsule (290 mcg total) by mouth daily.     mesalamine 1.2 G EC tablet  Commonly known as:  LIALDA  Take 1 tablet (1.2 g total) by mouth daily with breakfast.     metFORMIN 500 MG 24 hr tablet  Commonly known as:  GLUCOPHAGE-XR  Take 2 tablets (1,000 mg total) by mouth daily with supper.     multivitamin tablet  Take 1 tablet by mouth daily.     nebivolol 10 MG tablet  Commonly known as:  BYSTOLIC  Take 1 tablet (10 mg total) by mouth 2 (two) times daily.     nitrofurantoin 100 MG capsule  Commonly known as:  MACRODANTIN  Take 100 mg by mouth as directed. With sexual activity     Omega-3 Krill Oil 500 MG Caps  Take 500 mg by mouth.     pantoprazole 40 MG tablet  Commonly known  as:  PROTONIX  TAKE 1 TABLET BY MOUTH TWICE DAILY     PROBIOTIC DAILY PO  Take by mouth.     SALONPAS PAIN RELIEF PATCH EX  Apply topically as needed.     SAPHRIS 10 MG Subl  Generic drug:  Asenapine Maleate  Place 20 mg under the tongue at bedtime.     sennosides-docusate sodium 8.6-50 MG tablet  Commonly known as:  SENOKOT-S  Take 5 tablets by mouth at bedtime.     SitaGLIPtin-MetFORMIN HCl [phone] MG Tb24  Commonly known as:  JANUMET XR  TAKE 1 TABLET BY MOUTH DAILY AT DINNER     spironolactone 25 MG tablet  Commonly known as:  ALDACTONE  Take 25 mg by mouth 2 (two) times daily.     SYNTHROID 137 MCG tablet  Generic drug:  levothyroxine  Take 274 mcg by mouth daily before breakfast.     ticagrelor 90 MG Tabs tablet  Commonly known as:  BRILINTA  Take 1 tablet (90 mg total) by mouth 2 (two) times daily.     tiZANidine 4 MG tablet  Commonly known as:  ZANAFLEX  Take 4 mg by mouth. Take 1 by mouth as needed for mirgaine, may repeat after 2 hours if no relief     valACYclovir 500 MG tablet  Commonly known as:  VALTREX  Take 500 mg by mouth 2 (two) times daily.     VICTOZA 18 MG/3ML Sopn  Generic drug:  Liraglutide  Inject 0.2 mLs (1.2 mg total) into the skin daily. Inject once daily at the same time     vitamin C 500 MG tablet  Commonly known as:  ASCORBIC ACID  Take 250 mg by mouth daily. Takes 2 a day     Vitamin D3 2000 UNITS Tabs  Take 1 tablet by mouth daily.     zolpidem 10 MG tablet  Commonly known as:  AMBIEN  Take 10 mg by mouth at bedtime.        Allergies:  Allergies  Allergen Reactions  . Aspirin     REACTION: aggrivates colitis  . Crestor [Rosuvastatin]     REACTION: increased lfts  . Erythromycin     REACTION: GI upset  . Nsaids     REACTION: aggrivate colitis  . Oxycodone Anxiety    Past Medical History  Diagnosis Date  . Rosacea   . Family history of malignant neoplasm of gastrointestinal tract   . Allergic rhinitis, cause  unspecified   . Anxiety state, unspecified   . Unspecified asthma(493.90)   . Benign neoplasm of other and unspecified site of the digestive system   . Ulcerative colitis, unspecified   . Depression   . Fibromyalgia   . GERD (gastroesophageal reflux disease)   . Hiatal hernia   . Pure hypercholesterolemia   . Unspecified essential hypertension   . Unspecified hypothyroidism   . Insomnia, unspecified   . History of migraines   . Other screening mammogram   . Asymptomatic postmenopausal status (age-related) (natural)   . Unspecified vitamin D deficiency   . Edema   . Dysuria   . History of ovarian cyst   . Cyst     on Achilles tendon  . Tubular adenoma of colon 01/10/11  . Vertigo   . Subacute confusional state 11/19/2012  . ADD (attention deficit disorder) 11/19/2012  . OCD (obsessive compulsive disorder)   . Glaucoma   . History of alcoholism ([place])   . Bipolar disorder ([place])   . Diabetes mellitus ([place])     frank  . Atypical hyperplasia of left breast 2000  . Headache     migraines  . Arthritis   . Cancer ([place])     Left Breast 39mm invasive CA left uiq, dcis left uoq and a lot ADH, ALH in both breasts.  . Carcinoma of left breast ([place]) 06/02/2014  . CAD (coronary artery disease)     a. 08/2014 NSTEMI/Cath: LM nl, LAD 50p, D1/D2 min irregs, LCX nl, OM1 40, LPDA nl, RI 99ost (2.107mm  vessel->med Rx), RCA nl, AM 60, nl EF.  Marland Kitchen Chronic diastolic CHF (congestive heart failure) ([place])     a. 08/2014 Echo: EF 60-65%, Gr 1 DD, nl LA.  Marland Kitchen Myocardial infarction [facility]) 09-04-14    Past Surgical History  Procedure Laterality Date  . Wisdom tooth extraction  1980's  . Tonsillectomy  1984  . Cesarean section  1996/1997    placenta previa, gest DM, pre-eclampsia  . Nasal sinus surgery  05/1997  . Precancerous mole removed    . Cholecystectomy  1997    adhesions also  . Meckel diverticulum excision  1999  . Dexa  04/1999 and 2010    normal  . Colonoscopy  01/2001    Ulcerative colitis  .  Esophagogastroduodenoscopy  09/2001    polyp  . Exercise stress test  11/2003    negative  . Nuclear stress test  06/2007    negative  . Colonoscopy  04/2004    UC, polyp  . Colonoscopy  12/08    UC, no polyps  . Sleep study  11/09    no apnea, but did snore (done by [facility])  . Bunionectomy Right   . Femur fracture surgery Left   . Appendectomy    . Tubal ligation    . Simple mastectomy with axillary sentinel node biopsy Bilateral 05/23/2014    Procedure: Bilateral simple mastectomy, left sentinel node biopsy ;  Surgeon: Christene Lye, MD;  Location: [facility] ORS;  Service: General;  Laterality: Bilateral;  . Axillary sentinel node biopsy Left 05/23/2014    Procedure: AXILLARY SENTINEL NODE BIOPSY;  Surgeon: Christene Lye, MD;  Location: [facility] ORS;  Service: General;  Laterality: Left;  Marland Kitchen Mastectomy Bilateral 05-23-14    Dr. Jamal Collin  . Breast biopsy  Aug 2000    on tamoxifen, atypical hyperplasia  . Breast surgery Left Aug 2000    lumpectomy/ Dr Sharlet Salina  . Breast surgery Bilateral 05/23/14    Mastectomy  . Cardiac catheterization N/A 09/05/2014    Procedure: Left Heart Cath and Coronary Angiography;  Surgeon: Wellington Hampshire, MD;  Location: [place] [facility];  Service: Cardiovascular;  Laterality: N/A;  . Cardiac catheterization  09-05-14    Family History  Problem Relation Age of Onset  . Coronary artery disease Father   . Colon cancer Father   . Alzheimer's disease Father   . Dementia Father   . Coronary artery disease Mother   . Hypertension Mother   . Osteoporosis Mother   . Colitis Mother   . Crohn's disease Mother   . Breast cancer      great aunts  . Coronary artery disease      Uncle (also AAA)  . Diabetes      remote family history  . Stroke Cousin   . Esophageal cancer Neg Hx   . Rectal cancer Neg Hx   . Stomach cancer Neg Hx     Social History:  reports that she quit smoking about 21 years ago. Her smoking use included Cigarettes. She has a 1.25  pack-year smoking history. She has never used smokeless tobacco. She reports that she does not drink alcohol or use illicit drugs.  REVIEW Of SYSTEMS:   She is taking Aldactone for acne  HYPERTENSION: Blood pressure appears higher, managed by cardiologist.  Currently on clonidine, Bystolic and recent blood pressure at home was 136/92 She says she has had more stress and also today did not take her medication until right before she  came  History of hypercholesterolemia: Previously on 20 mg Lipitor now 80 mg since her MI  Lab Results  Component Value Date   CHOL 154 11/28/2014   HDL 39.40 11/28/2014   LDLCALC 65 10/13/2014   LDLDIRECT 94.0 11/28/2014   TRIG 235.0* 11/28/2014   CHOLHDL 4 11/28/2014      Examination:   BP 148/98 mmHg  Pulse 97  Temp(Src) 98.2 F (36.8 C)  Resp 16  Ht 5\' 6"  (1.676 m)  Wt 241 lb 3.2 oz (109.408 kg)  BMI 38.95 kg/m2  SpO2 96%  LMP 08/23/2006 (Approximate)  Repeat blood pressure similar with large cuff  She looks stressed Biceps reflexes normal No pedal edema    Assessments   DIABETES: She has had mild diabetes and this was previously controlled well with low-dose Janumet XR Since her MI and with having stress she has gained weight, has not been exercising or watching her diet consistently and her glucose readings are significantly high including over 200 in the office lab  She appears to have had significant progression of her diabetes since initial diagnosis in 3/16  Discussed that long-term she will need to be on somewhat more aggressive therapy especially with the need for weight loss She is a good candidate for medication like Victoza which is covered by her insurance and GLP-1 felt only class  Discussed with the patient the nature of GLP-1 drugs, the actions on various organ systems, how they benefit blood glucose control, as well as the benefit of weight loss and  increase satiety . Explained possible side effects especially nausea  and vomiting initially; discussed safety information in package insert.   Described the injection technique and dosage titration of Victoza  starting with 0.6 mg once a day at the same time for the first week and then increasing to 1.2 mg if no symptoms of nausea.   Educational brochure on Victoza and co-pay card given  She will also switch Janumet XR to metformin ER starting with 1000 mg a day when she finishes her current supply Emphasized need for more GLUCOSE monitoring at home especially after meals She will try to increase her exercise as much as possible   Hypothyroidism, post ablative:  She has had long-standing hypothyroidism and has normal levels in July with 2 tablets of 137 g Her symptoms are hard to judge subjectively and objectively because of her numerous other problems and depression/fibromyalgia  Will have follow-up done on her next lab work  Counseling time on subjects discussed above is over 50% of today's 25 minute visit   Nessie Nong 12/03/2014, 10:34 AM

## 2014-12-02 NOTE — Patient Instructions (Signed)
Check blood sugars on waking up 2-3  times a week Also check blood sugars about 2 hours after a meal and do this after different meals by rotation  Recommended blood sugar levels on waking up is 90-130 and about 2 hours after meal is 130-160  Please bring your blood sugar monitor to each visit, thank you  Start VICTOZA injection as shown once daily at the same time of the day.  Dial the dose to 0.6 mg on the pen for the first week.  You may inject in the stomach, thigh or arm. You may experience nausea in the first few days which usually goes away.  You will feel fullness of the stomach with starting the medication and should try to keep the portions at meals small. After 1 week increase the dose to 1.2mg  daily if no nausea present.   If any questions or concerns are present call the office or the [place] helpline at [phone].  Visit [email or web address].info/ for more useful information  Increase exercise as tolerated

## 2014-12-03 MED ORDER — INSULIN PEN NEEDLE 31G X 5 MM MISC: Status: DC

## 2014-12-14 ENCOUNTER — Ambulatory Visit: Payer: BLUE CROSS/BLUE SHIELD | Admitting: Cardiovascular Disease

## 2014-12-20 ENCOUNTER — Other Ambulatory Visit: Payer: Self-pay | Admitting: Endocrinology

## 2014-12-27 ENCOUNTER — Other Ambulatory Visit: Payer: Self-pay | Admitting: Family Medicine

## 2014-12-28 ENCOUNTER — Telehealth: Payer: Self-pay | Admitting: Internal Medicine

## 2014-12-28 NOTE — Telephone Encounter (Signed)
My pleasure.

## 2014-12-28 NOTE — Telephone Encounter (Signed)
Last OV was 10/21/14, last refilled on  08/15/14 #[id number] with 3 additional refills, please advise

## 2014-12-28 NOTE — Telephone Encounter (Signed)
Please refill for a year  

## 2014-12-28 NOTE — Telephone Encounter (Signed)
done

## 2015-01-12 ENCOUNTER — Ambulatory Visit: Payer: BLUE CROSS/BLUE SHIELD | Admitting: Oncology

## 2015-01-12 ENCOUNTER — Other Ambulatory Visit: Payer: BLUE CROSS/BLUE SHIELD

## 2015-01-12 ENCOUNTER — Encounter: Payer: Self-pay | Admitting: Family Medicine

## 2015-01-13 ENCOUNTER — Other Ambulatory Visit: Payer: Self-pay | Admitting: Gastroenterology

## 2015-01-13 ENCOUNTER — Ambulatory Visit: Payer: BLUE CROSS/BLUE SHIELD | Admitting: Endocrinology

## 2015-01-18 ENCOUNTER — Ambulatory Visit (INDEPENDENT_AMBULATORY_CARE_PROVIDER_SITE_OTHER): Payer: BLUE CROSS/BLUE SHIELD | Admitting: Family Medicine

## 2015-01-18 ENCOUNTER — Ambulatory Visit (INDEPENDENT_AMBULATORY_CARE_PROVIDER_SITE_OTHER)
Admission: RE | Admit: 2015-01-18 | Discharge: 2015-01-18 | Disposition: A | Discharge status: Home / Self Care | Payer: BLUE CROSS/BLUE SHIELD | Source: Ambulatory Visit | Attending: Family Medicine | Admitting: Family Medicine

## 2015-01-18 ENCOUNTER — Encounter: Payer: Self-pay | Admitting: Family Medicine

## 2015-01-18 VITALS — BP 124/78 | HR 90 | Temp 98.3°F | Ht 66.0 in | Wt 236.0 lb

## 2015-01-18 DIAGNOSIS — G43A0 Cyclical vomiting, not intractable: Secondary | ICD-10-CM

## 2015-01-18 DIAGNOSIS — E669 Obesity, unspecified: Secondary | ICD-10-CM | POA: Diagnosis not present

## 2015-01-18 DIAGNOSIS — K5900 Constipation, unspecified: Secondary | ICD-10-CM

## 2015-01-18 DIAGNOSIS — F3130 Bipolar disorder, current episode depressed, mild or moderate severity, unspecified: Secondary | ICD-10-CM

## 2015-01-18 DIAGNOSIS — K5909 Other constipation: Secondary | ICD-10-CM

## 2015-01-18 DIAGNOSIS — R112 Nausea with vomiting, unspecified: Secondary | ICD-10-CM | POA: Insufficient documentation

## 2015-01-18 MED ORDER — PROMETHAZINE HCL 25 MG PO TABS: 25.0000 mg | ORAL_TABLET | Freq: Four times a day (QID) | ORAL | Status: DC | PRN

## 2015-01-18 NOTE — Patient Instructions (Addendum)
Take phenergan as needed for nausea  Ginger ale (stir out the bubbles)- may want to use diet since it has so much sugar  Continue probiotic  Continue protonix Drink fluids  If eating- stick with BRAT or other bland diet (no fatty or spicy foods)   Xray of abdomen now - to assess the amount of constipation

## 2015-01-18 NOTE — Progress Notes (Signed)
Pre visit review using our clinic review tool, if applicable. No additional management support is needed unless otherwise documented below in the visit note. 

## 2015-01-18 NOTE — Progress Notes (Signed)
Subjective:    Patient ID: Lindsay Fry, female    DOB: 1961-01-08, 55 y.o.   MRN: [id number]  HPI Here for several problems   More depressed lately  Adj med for bipolar Sees psychiatry on 1/12   Frustrated trying to loose wt  Not motivated to exercise or diet   Having GI symptoms  She has foul burps /bad smelling/sulfar smell  Then she vomits (foul smelling) Then she gets diarrhea Pain around her umbilicus   no fevers   First episode was in June - thought that was food rel (tomato) Then again in aug /sept -lasted about 36 hours   Was fine yesterday  (ate wings from [place] place and also some provolone cheese)-and nothing tasted bad  Woke up with the burps again  Today is afraid to eat or drink  Some loose stool today No blood in stool   Hx of UC  Dr Olevia Perches retired She has appt to est with Dr Henrene Pastor end of the month  Will be due colonoscopy in spring   (a colectomy was recommended)    No abd pain today  She had ccy in 1999 as well as appy and lysis of adhesions and removal of meckel's diverticulum  Patient Active Problem List   Diagnosis Date Noted  . Nausea with vomiting 01/18/2015  . Chronic constipation 01/18/2015  . Acute bronchitis 11/15/2014  . NSTEMI (non-ST elevated myocardial infarction) ([place]) 09/06/2014  . CAD (coronary artery disease) 09/06/2014  . DM (diabetes mellitus) ([place]) 09/05/2014  . 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 Carcinoma of left breast ([place]) 06/02/2014  . Sinus tachycardia ([place]) 04/27/2014  . Obesity 05/20/2013  . Bladder pain 02/17/2013  . Subacute confusional state 11/19/2012  . ADD (attention deficit disorder) 11/19/2012  . Chest pain 08/05/2012  . Urinary frequency 08/05/2012  . Low back pain 08/05/2012  . 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 Chronic sinusitis 01/24/2012  . Vertigo, benign positional 01/24/2012  . Other screening mammogram 12/05/2010  . Routine general medical examination at a  health care facility 10/14/2010  . FATIGUE 08/09/2009  . POSTMENOPAUSAL STATUS 06/22/2008  . UNSPECIFIED VITAMIN D DEFICIENCY 03/18/2008  . BENIGN NEOPLASM [place] SITE DIGESTIVE SYSTEM 01/09/2007  . HIATAL HERNIA 01/09/2007  . COLONIC POLYPS, ADENOMATOUS, HX OF 01/09/2007  . HYPERCHOLESTEROLEMIA 01/07/2007  . Bipolar disorder, unspecified ([place]) 01/07/2007  . Bipolar I disorder, most recent episode depressed ([place]) 01/07/2007  . Essential hypertension 01/07/2007  . ALLERGIC RHINITIS 01/07/2007  . ASTHMA 01/07/2007  . GERD 01/07/2007  . COLITIS, ULCERATIVE 01/07/2007  . ACNE ROSACEA 01/07/2007  . MIGRAINES, HX OF 01/07/2007  . Fibromyalgia 10/30/2006  . INSOMNIA 10/30/2006   Past Medical History  Diagnosis Date  . Rosacea   . Family history of malignant neoplasm of gastrointestinal tract   . Allergic rhinitis, cause unspecified   . Anxiety state, unspecified   . Unspecified asthma(493.90)   . Benign neoplasm of other and unspecified site of the digestive system   . Ulcerative colitis, unspecified   . Depression   . Fibromyalgia   . GERD (gastroesophageal reflux disease)   . Hiatal hernia   . Pure hypercholesterolemia   . Unspecified essential hypertension   . Unspecified hypothyroidism   . Insomnia, unspecified   . History of migraines   . Other screening mammogram   . Asymptomatic postmenopausal status (age-related) (natural)   . Unspecified vitamin D deficiency   . Edema   . Dysuria   . History  of ovarian cyst   . Cyst     on Achilles tendon  . Tubular adenoma of colon 01/10/11  . Vertigo   . Subacute confusional state 11/19/2012  . ADD (attention deficit disorder) 11/19/2012  . OCD (obsessive compulsive disorder)   . Glaucoma   . History of alcoholism ([place])   . Bipolar disorder ([place])   . Diabetes mellitus ([place])     frank  . Atypical hyperplasia of left breast 2000  . Headache     migraines  . Arthritis   . Cancer ([place])     Left Breast 2m invasive CA  left uiq, dcis left uoq and a lot ADH, ALH in both breasts.  . Carcinoma of left breast ([place] 06/02/2014  . CAD (coronary artery disease)     a. 08/2014 NSTEMI/Cath: LM nl, LAD 50p, D1/D2 min irregs, LCX nl, OM1 40, LPDA nl, RI 99ost (2.046mvessel->med Rx), RCA nl, AM 60, nl EF.  . Marland Kitchenhronic diastolic CHF (congestive heart failure) ([place]    a. 08/2014 Echo: EF 60-65%, Gr 1 DD, nl LA.  . [facility]   Past Surgical History  Procedure Laterality Date  . Wisdom tooth extraction  1980's  . Tonsillectomy  1984  . Cesarean section  1996/1997    placenta previa, gest DM, pre-eclampsia  . Nasal sinus surgery  05/1997  . Precancerous mole removed    . Cholecystectomy  1997    adhesions also  . Meckel diverticulum excision  1999  . Dexa  04/1999 and 2010    normal  . Colonoscopy  01/2001    Ulcerative colitis  . Esophagogastroduodenoscopy  09/2001    polyp  . Exercise stress test  11/2003    negative  . Nuclear stress test  06/2007    negative  . Colonoscopy  04/2004    UC, polyp  . Colonoscopy  12/08    UC, no polyps  . Sleep study  11/09    no apnea, but did snore (done by [facility])  . Bunionectomy Right   . Femur fracture surgery Left   . Appendectomy    . Tubal ligation    . Simple mastectomy with axillary sentinel node biopsy Bilateral 05/23/2014    Procedure: Bilateral simple mastectomy, left sentinel node biopsy ;  Surgeon: SeChristene LyeMD;  Location: [facility] ORS;  Service: General;  Laterality: Bilateral;  . Axillary sentinel node biopsy Left 05/23/2014    Procedure: AXILLARY SENTINEL NODE BIOPSY;  Surgeon: SeChristene LyeMD;  Location: [facility] ORS;  Service: General;  Laterality: Left;  . Marland Kitchenastectomy Bilateral 05-23-14    Dr. SaJamal Collin. Breast biopsy  Aug 2000    on tamoxifen, atypical hyperplasia  . Breast surgery Left Aug 2000    lumpectomy/ Dr CrSharlet Salina. Breast surgery Bilateral 05/23/14    Mastectomy  . Cardiac catheterization N/A 09/05/2014     Procedure: Left Heart Cath and Coronary Angiography;  Surgeon: MuWellington HampshireMD;  [facility];  Service: Cardiovascular;  Laterality: N/A;  . Cardiac catheterization  09-05-14  . ChMattesonistory  Substance Use Topics  . Smoking status: Former Smoker -- 0.25 packs/day for 5 years    Types: Cigarettes    Quit date: 01/14/1993  . Smokeless tobacco: Never Used  . Alcohol Use: No     Comment: Recovered ETOH   Family History  Problem Relation Age of Onset  . Coronary artery  disease Father   . Colon cancer Father   . Alzheimer's disease Father   . Dementia Father   . Coronary artery disease Mother   . Hypertension Mother   . Osteoporosis Mother   . Colitis Mother   . Crohn's disease Mother   . Breast cancer      great aunts  . Coronary artery disease      Uncle (also AAA)  . Diabetes      remote family history  . Stroke Cousin   . Esophageal cancer Neg Hx   . Rectal cancer Neg Hx   . Stomach cancer Neg Hx    Allergies  Allergen Reactions  . Aspirin     REACTION: aggrivates colitis  . Crestor [Rosuvastatin]     REACTION: increased lfts  . Erythromycin     REACTION: GI upset  . Nsaids     REACTION: aggrivate colitis  . Oxycodone Anxiety   Current Outpatient Prescriptions on File Prior to Visit  Medication Sig Dispense Refill  . Asenapine Maleate (SAPHRIS) 10 MG SUBL Place 20 mg under the tongue at bedtime.    Marland Kitchen aspirin 81 MG tablet Take 81 mg by mouth daily.    Marland Kitchen atorvastatin (LIPITOR) 80 MG tablet TAKE 1 TABLET(80 MG) BY MOUTH DAILY 30 tablet 6  . b complex vitamins capsule Take 1 capsule by mouth daily.    Marland Kitchen buPROPion (WELLBUTRIN XL) 150 MG 24 hr tablet Take 450 mg by mouth daily.     . Calcium 600-200 MG-UNIT per tablet Take 1 tablet by mouth 2 (two) times daily. 60 tablet 6  . chlorproMAZINE (THORAZINE) 25 MG tablet Take 25-50 mg by mouth as needed. For migraines if frova doesn't help.    . Cholecalciferol (VITAMIN D3) 2000  UNITS TABS Take 1 tablet by mouth daily.    . cloNIDine (CATAPRES) 0.1 MG tablet TAKE 1 TABLET BY MOUTH TWICE DAILY (Patient taking differently: TAKE 1/2 TABLET BY MOUTH TWICE DAILY) 60 tablet 11  . co-enzyme Q-10 50 MG capsule Take 50 mg by mouth daily.    . cyclobenzaprine (FLEXERIL) 10 MG tablet TAKE 2 TABLETS BY MOUTH EVERY NIGHT AT BEDTIME 180 tablet 1  . diazepam (VALIUM) 10 MG tablet Take 2.5-10 mg by mouth as needed for anxiety.    Marland Kitchen diltiazem (CARDIZEM CD) 240 MG 24 hr capsule Take 1 capsule (240 mg total) by mouth daily. 90 capsule 3  . diphenhydrAMINE (BENADRYL) 50 MG tablet Take 100 mg by mouth at bedtime as needed (anxiety).     . fluticasone (FLONASE) 50 MCG/ACT nasal spray USE 2 SPRAYS IN EACH NOSTRIL TWICE DAILY 16 g 5  . gabapentin (NEURONTIN) 300 MG capsule TAKE 1 CAPSULE(300 MG) BY MOUTH THREE TIMES DAILY 90 capsule 11  . glucose blood (ONETOUCH VERIO) test strip Use as instructed to check blood sugar twice a day 100 each 3  . Insulin Pen Needle 31G X 5 MM MISC Use once a day with Victoza pen 30 each 3  . lamoTRIgine (LAMICTAL) 200 MG tablet Take 400 mg by mouth daily.    Marland Kitchen latanoprost (XALATAN) 0.005 % ophthalmic solution Place 1 drop into both eyes At bedtime.    Marland Kitchen letrozole (FEMARA) 2.5 MG tablet Take 1 tablet (2.5 mg total) by mouth daily. 30 tablet 6  . Linaclotide (LINZESS) 290 MCG CAPS capsule Take 1 capsule (290 mcg total) by mouth daily. 30 capsule 6  . Liniments (SALONPAS PAIN RELIEF PATCH EX) Apply topically as needed.    Marland Kitchen  mesalamine (LIALDA) 1.2 G EC tablet Take 1 tablet (1.2 g total) by mouth daily with breakfast. 30 tablet 6  . metFORMIN (GLUCOPHAGE-XR) 500 MG 24 hr tablet Take 2 tablets (1,000 mg total) by mouth daily with supper. 60 tablet 3  . Multiple Vitamin (MULTIVITAMIN) tablet Take 1 tablet by mouth daily.      . nebivolol (BYSTOLIC) 10 MG tablet Take 1 tablet (10 mg total) by mouth 2 (two) times daily. 180 tablet 1  . nitrofurantoin (MACRODANTIN) 100 MG  capsule Take 100 mg by mouth as directed. With sexual activity     . Omega-3 Krill Oil 500 MG CAPS Take 500 mg by mouth.    . pantoprazole (PROTONIX) 40 MG tablet TAKE 1 TABLET BY MOUTH TWICE DAILY 60 tablet 0  . Probiotic Product (PROBIOTIC DAILY PO) Take by mouth.    . sennosides-docusate sodium (SENOKOT-S) 8.6-50 MG tablet Take 5 tablets by mouth at bedtime.    . Simethicone (GAS-X PO) Take 1 tablet by mouth 4 (four) times daily.    . SitaGLIPtin-MetFORMIN HCl (JANUMET XR) [phone] MG TB24 TAKE 1 TABLET BY MOUTH DAILY AT DINNER 30 tablet 3  . spironolactone (ALDACTONE) 25 MG tablet Take 25 mg by mouth 2 (two) times daily.      Marland Kitchen SYNTHROID 137 MCG tablet TAKE 2 TABLETS BY MOUTH DAILY BEFORE BREAKFAST. 60 tablet 5  . ticagrelor (BRILINTA) 90 MG TABS tablet Take 1 tablet (90 mg total) by mouth 2 (two) times daily. 180 tablet 3  . tiZANidine (ZANAFLEX) 4 MG tablet Take 4 mg by mouth. Take 1 by mouth as needed for mirgaine, may repeat after 2 hours if no relief    . valACYclovir (VALTREX) 500 MG tablet Take 500 mg by mouth 2 (two) times daily.      Marland Kitchen VICTOZA 18 MG/3ML SOPN Inject 0.2 mLs (1.2 mg total) into the skin daily. Inject once daily at the same time 2 pen 3  . vitamin C (ASCORBIC ACID) 500 MG tablet Take 250 mg by mouth daily. Takes 2 a day    . zolpidem (AMBIEN) 10 MG tablet Take 10 mg by mouth at bedtime.     No current facility-administered medications on file prior to visit.     Review of Systems Review of Systems  Constitutional: Negative for fever, appetite change, fatigue and unexpected weight change.  Eyes: Negative for pain and visual disturbance.  Respiratory: Negative for cough and shortness of breath.   Cardiovascular: Negative for cp or palpitations    Gastrointestinal: Negative for blood in stool/ black stool or abd pain  Genitourinary: Negative for urgency and frequency.  Skin: Negative for pallor or rash   Neurological: Negative for weakness, light-headedness,  numbness and headaches.  Hematological: Negative for adenopathy. Does not bruise/bleed easily.  Psychiatric/Behavioral: Negative for dysphoric mood. The patient is not nervous/anxious.         Objective:   Physical Exam  Constitutional: She appears well-developed and well-nourished. No distress.  Obese and fatigued appearing   HENT:  Head: Normocephalic and atraumatic.  Mouth/Throat: Oropharynx is clear and moist.  Eyes: Conjunctivae and EOM are normal. Pupils are equal, round, and reactive to light. No scleral icterus.  Neck: Normal range of motion. Neck supple.  Cardiovascular: Normal rate, regular rhythm and normal heart sounds.   Pulmonary/Chest: Effort normal and breath sounds normal. No respiratory distress. She has no wheezes. She has no rales.  Abdominal: Soft. Bowel sounds are normal. She exhibits no distension, no abdominal  bruit and no mass. There is no hepatosplenomegaly. There is tenderness in the right lower quadrant and left lower quadrant. There is no rigidity, no rebound, no guarding, no CVA tenderness, no tenderness at McBurney's point and negative Murphy's sign.  Very mild tenderness bilat LQ   Musculoskeletal: She exhibits no edema.  Lymphadenopathy:    She has no cervical adenopathy.  Neurological: She is alert. She has normal reflexes. She exhibits normal muscle tone. Coordination normal.  Skin: Skin is warm and dry. No erythema. No pallor.  Psychiatric: Her speech is normal and behavior is normal. Thought content normal. Her mood appears not anxious. Her affect is blunt. She exhibits a depressed mood.          Assessment & Plan:   Problem List Items Addressed This Visit      Digestive   Chronic constipation   Relevant Orders   DG Abd 2 Views (Completed)   Nausea with vomiting - Primary    Recurrent - accompanied by foul gas and burping  Hx of severe constipation as well  abd xr -neg for bowel obs  Will stay on probiotic and PPI Phenergan px  F/u  with GI as planned  Urged her to keep a diet journal- be mindful of foods that may exacerbate Consider lab if not imp  Of note-already had ccy- ? If CBD stone is possible       Relevant Orders   DG Abd 2 Views (Completed)     Other   Bipolar I disorder, most recent episode depressed ([place])    Worse lately Has f/u planned with her psychiatrist  No SI        Obesity    Discussed how this problem influences overall health and the risks it imposes  Reviewed plan for weight loss with lower calorie diet (via better food choices and also portion control or program like weight watchers) and exercise building up to or more than 30 minutes 5 days per week including some aerobic activity   Pt will likely need to get depression better controlled -not motivated to stick with lifestyle change or exercise at this time

## 2015-01-19 NOTE — Assessment & Plan Note (Signed)
Discussed how this problem influences overall health and the risks it imposes  Reviewed plan for weight loss with lower calorie diet (via better food choices and also portion control or program like weight watchers) and exercise building up to or more than 30 minutes 5 days per week including some aerobic activity   Pt will likely need to get depression better controlled -not motivated to stick with lifestyle change or exercise at this time

## 2015-01-19 NOTE — Assessment & Plan Note (Signed)
Worse lately Has f/u planned with her psychiatrist  No SI

## 2015-01-19 NOTE — Assessment & Plan Note (Signed)
Recurrent - accompanied by foul gas and burping  Hx of severe constipation as well  abd xr -neg for bowel obs  Will stay on probiotic and PPI Phenergan px  F/u with GI as planned  Urged her to keep a diet journal- be mindful of foods that may exacerbate Consider lab if not imp  Of note-already had ccy- ? If CBD stone is possible

## 2015-01-23 ENCOUNTER — Other Ambulatory Visit: Payer: BLUE CROSS/BLUE SHIELD

## 2015-01-25 ENCOUNTER — Other Ambulatory Visit (INDEPENDENT_AMBULATORY_CARE_PROVIDER_SITE_OTHER): Payer: BLUE CROSS/BLUE SHIELD

## 2015-01-25 DIAGNOSIS — E1165 Type 2 diabetes mellitus with hyperglycemia: Secondary | ICD-10-CM | POA: Diagnosis not present

## 2015-01-25 DIAGNOSIS — E890 Postprocedural hypothyroidism: Secondary | ICD-10-CM | POA: Diagnosis not present

## 2015-01-25 LAB — T4, FREE: Free T4: 1.24 ng/dL (ref 0.60–1.60)

## 2015-01-25 LAB — BASIC METABOLIC PANEL
BUN: 16 mg/dL (ref 6–23)
CO2: 29 mEq/L (ref 19–32)
Calcium: 10.4 mg/dL (ref 8.4–10.5)
Chloride: 100 mEq/L (ref 96–112)
Creatinine, Ser: 0.88 mg/dL (ref 0.40–1.20)
GFR: 70.52 mL/min (ref >60.00)
Glucose, Bld: 170 mg/dL — ABNORMAL HIGH (ref 70–99)
Potassium: 4.9 mEq/L (ref 3.5–5.1)
Sodium: 139 mEq/L (ref 135–145)

## 2015-01-25 LAB — TSH: TSH: 4.13 u[IU]/mL (ref 0.35–4.50)

## 2015-01-30 ENCOUNTER — Encounter: Payer: Self-pay | Admitting: Endocrinology

## 2015-01-30 ENCOUNTER — Ambulatory Visit (INDEPENDENT_AMBULATORY_CARE_PROVIDER_SITE_OTHER): Payer: BLUE CROSS/BLUE SHIELD | Admitting: Endocrinology

## 2015-01-30 VITALS — BP 124/84 | HR 102 | Temp 97.7°F | Resp 16 | Ht 66.0 in | Wt 237.2 lb

## 2015-01-30 DIAGNOSIS — E1165 Type 2 diabetes mellitus with hyperglycemia: Secondary | ICD-10-CM | POA: Diagnosis not present

## 2015-01-30 DIAGNOSIS — E890 Postprocedural hypothyroidism: Secondary | ICD-10-CM | POA: Diagnosis not present

## 2015-01-30 LAB — FRUCTOSAMINE: Fructosamine: 220 umol/L (ref 190–270)

## 2015-01-30 MED ORDER — METFORMIN HCL ER 500 MG PO TB24: 1500.0000 mg | ORAL_TABLET | Freq: Every day | ORAL | Status: DC

## 2015-01-30 NOTE — Patient Instructions (Signed)
2 metformin at dinner for 1 week and then go upto 3 daily  Check blood sugars on waking up 2-3  times a week Also check blood sugars about 2 hours after a meal and do this after different meals by rotation  Recommended blood sugar levels on waking up is 90-130 and about 2 hours after meal is 130-160  Please bring your blood sugar monitor to each visit, thank you

## 2015-01-30 NOTE — Progress Notes (Signed)
Patient ID: Lindsay Fry, female   DOB: 1960/07/03, 55 y.o.   MRN: [id number]   Reason for Appointment:  Hypothyroidism and diabetes, followup visit  History of Present Illness:  DIABETES: With her high A1c of 6.7 in 03/2014 she had an abnormal glucose tolerance test indicating diabetes and 1 hour glucose of 300 She was started on metformin; however even with 1500 mg of metformin ER her blood sugars were not controlled especially fasting Subsequently with taking Janumet XR 100/1000 daily her blood sugars were much better  Because of her weight gain and A1c going up to 7.3 along with occasional readings over 200 she was started on Victoza in 11/16 She is now here for follow-up  Current management, blood sugar patterns and problems identified:  She still appears to have high fasting readings, this was 170 in the lab  She is still checking blood sugar very sporadically; not clear if her readings midday or before or after eating, recent range 140-186 between 12 noon-2 PM  She has only one reading of 149 after supper  She switched from Janumet to metformin but instead of 1000 mg is taking only 500 mg by mistake  Fructosamine is 220 but her lab glucose was high fasting  She is losing weight and is able to cut back on portions significantly with Victoza  Recently because of depression and other intercurrent problems she is not exercising  Weight history:  Wt Readings from Last 3 Encounters:  01/30/15 237 lb 3.2 oz (107.593 kg)  01/18/15 236 lb (107.049 kg)  12/02/14 241 lb 3.2 oz (109.408 kg)   LABS:  Lab Results  Component Value Date   HGBA1C 7.3* 11/28/2014   HGBA1C 5.4 06/07/2014   HGBA1C 6.7* 02/17/2014   Lab Results  Component Value Date   MICROALBUR 17.3* 11/28/2014   LDLCALC 65 10/13/2014   CREATININE 0.88 01/25/2015     HYPOTHYROIDISM: This was first diagnosed in 1992 after her treatment for Graves' disease with I-131 She has been on relatively  large doses of thyroxine supplements She was on 224 mcg since 11/13 and her TSH was normal in 3/14 Subjectively difficult to assess her thyroid since she tends to have fatigue chronically.  In 2014 her dose was reduced to 200 mcg daily and her dose has been fluctuating since then She has generally required periodic increase in dosage including in 05/2014 when her TSH was 20  She is now on 2 tablets of 137 g daily, continues on the brand name Synthroid  Her symptoms are usually nonspecific for hypothyroidism, mostly related to other medical problems including sleep disturbances and bipolar illness.   She has been quite regular with the Synthroid in the mornings without any interfering substances         TSH is normal consistently for the last 2 labs:   Lab Results  Component Value Date   TSH 4.13 01/25/2015   TSH 3.09 07/27/2014   TSH 20.09* 06/07/2014   FREET4 1.24 01/25/2015   FREET4 1.18 07/27/2014   FREET4 0.99 02/17/2014     Lab on 01/25/2015  Component Date Value Ref Range Status  . Sodium 01/25/2015 139  135 - 145 mEq/L Final  . Potassium 01/25/2015 4.9  3.5 - 5.1 mEq/L Final  . Chloride 01/25/2015 100  96 - 112 mEq/L Final  . CO2 01/25/2015 29  19 - 32 mEq/L Final  . Glucose, Bld 01/25/2015 170* 70 - 99 mg/dL Final  . BUN 01/25/2015 16  6 - 23 mg/dL Final  . Creatinine, Ser 01/25/2015 0.88  0.40 - 1.20 mg/dL Final  . Calcium 01/25/2015 10.4  8.4 - 10.5 mg/dL Final  . GFR 01/25/2015 70.52  >60.00 mL/min Final  . Fructosamine 01/25/2015 220  190 - 270 umol/L Final  . TSH 01/25/2015 4.13  0.35 - 4.50 uIU/mL Final  . Free T4 01/25/2015 1.24  0.60 - 1.60 ng/dL Final      Medication List       This list is accurate as of: 01/30/15  4:05 PM.  Always use your most recent med list.               aspirin 81 MG tablet  Take 81 mg by mouth daily.     atorvastatin 80 MG tablet  Commonly known as:  LIPITOR  TAKE 1 TABLET(80 MG) BY MOUTH DAILY     b complex vitamins  capsule  Take 1 capsule by mouth daily.     buPROPion 150 MG 24 hr tablet  Commonly known as:  WELLBUTRIN XL  Take 450 mg by mouth daily.     Calcium 600-200 MG-UNIT tablet  Take 1 tablet by mouth 2 (two) times daily.     chlorproMAZINE 25 MG tablet  Commonly known as:  THORAZINE  Take 25-50 mg by mouth as needed. For migraines if frova doesn't help.     cloNIDine 0.1 MG tablet  Commonly known as:  CATAPRES  TAKE 1 TABLET BY MOUTH TWICE DAILY     co-enzyme Q-10 50 MG capsule  Take 50 mg by mouth daily.     cyclobenzaprine 10 MG tablet  Commonly known as:  FLEXERIL  TAKE 2 TABLETS BY MOUTH EVERY NIGHT AT BEDTIME     diazepam 10 MG tablet  Commonly known as:  VALIUM  Take 2.5-10 mg by mouth as needed for anxiety.     diltiazem 240 MG 24 hr capsule  Commonly known as:  CARDIZEM CD  Take 1 capsule (240 mg total) by mouth daily.     diphenhydrAMINE 50 MG tablet  Commonly known as:  BENADRYL  Take 100 mg by mouth at bedtime as needed (anxiety).     fluticasone 50 MCG/ACT nasal spray  Commonly known as:  FLONASE  USE 2 SPRAYS IN EACH NOSTRIL TWICE DAILY     gabapentin 300 MG capsule  Commonly known as:  NEURONTIN  TAKE 1 CAPSULE(300 MG) BY MOUTH THREE TIMES DAILY     GAS-X PO  Take 1 tablet by mouth 4 (four) times daily.     glucose blood test strip  Commonly known as:  ONETOUCH VERIO  Use as instructed to check blood sugar twice a day     Insulin Pen Needle 31G X 5 MM Misc  Use once a day with Victoza pen     lamoTRIgine 200 MG tablet  Commonly known as:  LAMICTAL  Take 400 mg by mouth daily.     latanoprost 0.005 % ophthalmic solution  Commonly known as:  XALATAN  Place 1 drop into both eyes At bedtime.     letrozole 2.5 MG tablet  Commonly known as:  FEMARA  Take 1 tablet (2.5 mg total) by mouth daily.     Linaclotide 290 MCG Caps capsule  Commonly known as:  LINZESS  Take 1 capsule (290 mcg total) by mouth daily.     mesalamine 1.2 g EC tablet    Commonly known as:  LIALDA  Take 1 tablet (1.2 g  total) by mouth daily with breakfast.     metFORMIN 500 MG 24 hr tablet  Commonly known as:  GLUCOPHAGE-XR  Take 3 tablets (1,500 mg total) by mouth daily with supper.     multivitamin tablet  Take 1 tablet by mouth daily.     nebivolol 10 MG tablet  Commonly known as:  BYSTOLIC  Take 1 tablet (10 mg total) by mouth 2 (two) times daily.     nitrofurantoin 100 MG capsule  Commonly known as:  MACRODANTIN  Take 100 mg by mouth as directed. With sexual activity     Omega-3 Krill Oil 500 MG Caps  Take 500 mg by mouth.     pantoprazole 40 MG tablet  Commonly known as:  PROTONIX  TAKE 1 TABLET BY MOUTH TWICE DAILY     PROBIOTIC DAILY PO  Take by mouth.     promethazine 25 MG tablet  Commonly known as:  PHENERGAN  Take 1 tablet (25 mg total) by mouth every 6 (six) hours as needed for nausea or vomiting.     SALONPAS PAIN RELIEF PATCH EX  Apply topically as needed.     SAPHRIS 10 MG Subl  Generic drug:  Asenapine Maleate  Place 20 mg under the tongue at bedtime.     sennosides-docusate sodium 8.6-50 MG tablet  Commonly known as:  SENOKOT-S  Take 5 tablets by mouth at bedtime.     sertraline 25 MG tablet  Commonly known as:  ZOLOFT  Take 25 mg by mouth daily.     spironolactone 25 MG tablet  Commonly known as:  ALDACTONE  Take 25 mg by mouth 2 (two) times daily.     SYNTHROID 137 MCG tablet  Generic drug:  levothyroxine  TAKE 2 TABLETS BY MOUTH DAILY BEFORE BREAKFAST.     ticagrelor 90 MG Tabs tablet  Commonly known as:  BRILINTA  Take 1 tablet (90 mg total) by mouth 2 (two) times daily.     tiZANidine 4 MG tablet  Commonly known as:  ZANAFLEX  Take 4 mg by mouth. Take 1 by mouth as needed for mirgaine, may repeat after 2 hours if no relief     valACYclovir 500 MG tablet  Commonly known as:  VALTREX  Take 500 mg by mouth 2 (two) times daily.     VICTOZA 18 MG/3ML Sopn  Generic drug:  Liraglutide  Inject 0.2  mLs (1.2 mg total) into the skin daily. Inject once daily at the same time     vitamin C 500 MG tablet  Commonly known as:  ASCORBIC ACID  Take 250 mg by mouth daily. Takes 2 a day     Vitamin D3 2000 units Tabs  Take 1 tablet by mouth daily.     zolpidem 10 MG tablet  Commonly known as:  AMBIEN  Take 10 mg by mouth at bedtime.        Allergies:  Allergies  Allergen Reactions  . Aspirin     REACTION: aggrivates colitis  . Crestor [Rosuvastatin]     REACTION: increased lfts  . Erythromycin     REACTION: GI upset  . Nsaids     REACTION: aggrivate colitis  . Oxycodone Anxiety    Past Medical History  Diagnosis Date  . Rosacea   . Family history of malignant neoplasm of gastrointestinal tract   . Allergic rhinitis, cause unspecified   . Anxiety state, unspecified   . Unspecified asthma(493.90)   . Benign neoplasm of other and  unspecified site of the digestive system   . Ulcerative colitis, unspecified   . Depression   . Fibromyalgia   . GERD (gastroesophageal reflux disease)   . Hiatal hernia   . Pure hypercholesterolemia   . Unspecified essential hypertension   . Unspecified hypothyroidism   . Insomnia, unspecified   . History of migraines   . Other screening mammogram   . Asymptomatic postmenopausal status (age-related) (natural)   . Unspecified vitamin D deficiency   . Edema   . Dysuria   . History of ovarian cyst   . Cyst     on Achilles tendon  . Tubular adenoma of colon 01/10/11  . Vertigo   . Subacute confusional state 11/19/2012  . ADD (attention deficit disorder) 11/19/2012  . OCD (obsessive compulsive disorder)   . Glaucoma   . History of alcoholism ([place])   . Bipolar disorder ([place])   . Diabetes mellitus ([place])     frank  . Atypical hyperplasia of left breast 2000  . Headache     migraines  . Arthritis   . Cancer ([place])     Left Breast 56mm invasive CA left uiq, dcis left uoq and a lot ADH, ALH in both breasts.  . Carcinoma of left breast ([place])  06/02/2014  . CAD (coronary artery disease)     a. 08/2014 NSTEMI/Cath: LM nl, LAD 50p, D1/D2 min irregs, LCX nl, OM1 40, LPDA nl, RI 99ost (2.28mm vessel->med Rx), RCA nl, AM 60, nl EF.  Marland Kitchen Chronic diastolic CHF (congestive heart failure) ([place])     a. 08/2014 Echo: EF 60-65%, Gr 1 DD, nl LA.  Marland Kitchen Myocardial infarction [place]) 09-04-14    Past Surgical History  Procedure Laterality Date  . Wisdom tooth extraction  1980's  . Tonsillectomy  1984  . Cesarean section  1996/1997    placenta previa, gest DM, pre-eclampsia  . Nasal sinus surgery  05/1997  . Precancerous mole removed    . Cholecystectomy  1997    adhesions also  . Meckel diverticulum excision  1999  . Dexa  04/1999 and 2010    normal  . Colonoscopy  01/2001    Ulcerative colitis  . Esophagogastroduodenoscopy  09/2001    polyp  . Exercise stress test  11/2003    negative  . Nuclear stress test  06/2007    negative  . Colonoscopy  04/2004    UC, polyp  . Colonoscopy  12/08    UC, no polyps  . Sleep study  11/09    no apnea, but did snore (done by [facility])  . Bunionectomy Right   . Femur fracture surgery Left   . Appendectomy    . Tubal ligation    . Simple mastectomy with axillary sentinel node biopsy Bilateral 05/23/2014    Procedure: Bilateral simple mastectomy, left sentinel node biopsy ;  Surgeon: Christene Lye, MD;  Location: [facility] ORS;  Service: General;  Laterality: Bilateral;  . Axillary sentinel node biopsy Left 05/23/2014    Procedure: AXILLARY SENTINEL NODE BIOPSY;  Surgeon: Christene Lye, MD;  Location: [facility] ORS;  Service: General;  Laterality: Left;  Marland Kitchen Mastectomy Bilateral 05-23-14    Dr. Jamal Collin  . Breast biopsy  Aug 2000    on tamoxifen, atypical hyperplasia  . Breast surgery Left Aug 2000    lumpectomy/ Dr Sharlet Salina  . Breast surgery Bilateral 05/23/14    Mastectomy  . Cardiac catheterization N/A 09/05/2014    Procedure: Left Heart Cath and Coronary Angiography;  Surgeon: Wellington Hampshire, MD;   Location: [facility];  Service: Cardiovascular;  Laterality: N/A;  . Cardiac catheterization  09-05-14  . Cholecystect  1999    Family History  Problem Relation Age of Onset  . Coronary artery disease Father   . Colon cancer Father   . Alzheimer's disease Father   . Dementia Father   . Coronary artery disease Mother   . Hypertension Mother   . Osteoporosis Mother   . Colitis Mother   . Crohn's disease Mother   . Breast cancer      great aunts  . Coronary artery disease      Uncle (also AAA)  . Diabetes      remote family history  . Stroke Cousin   . Esophageal cancer Neg Hx   . Rectal cancer Neg Hx   . Stomach cancer Neg Hx     Social History:  reports that she quit smoking about 22 years ago. Her smoking use included Cigarettes. She has a 1.25 pack-year smoking history. She has never used smokeless tobacco. She reports that she does not drink alcohol or use illicit drugs.  REVIEW Of SYSTEMS:   She is taking Aldactone for acne  HYPERTENSION: Blood pressure is high normal, managed by cardiologist.   Currently on clonidine, Bystolic and recent blood pressure at home was 136/92  History of hypercholesterolemia: Previously on 20 mg Lipitor now 80 mg since her MI  Lab Results  Component Value Date   CHOL 154 11/28/2014   HDL 39.40 11/28/2014   LDLCALC 65 10/13/2014   LDLDIRECT 94.0 11/28/2014   TRIG 235.0* 11/28/2014   CHOLHDL 4 11/28/2014      Examination:   BP 124/84 mmHg  Pulse 102  Temp(Src) 97.7 F (36.5 C)  Resp 16  Ht 5\' 6"  (1.676 m)  Wt 237 lb 3.2 oz (107.593 kg)  BMI 38.30 kg/m2  SpO2 94%  LMP 08/23/2006 (Approximate)      Assessments   DIABETES:  See history of present illness for detailed discussion of his current management, blood sugar patterns and problems identified  She is now taking Victoza with improvement in her overall control and some weight loss However still has relatively high fasting readings including a reading of 117  the lab  She has had improved satiety with Victoza and a little weight loss She is however taking only 500 mg of metformin instead of the prescribed 1000 mg Currently monitoring blood sugars somewhat infrequently at home  She was recommended increasing the metformin by one tablet now and another tablet next week 4 final dose of 1500 mg Restart exercise when able to More readings at various times including fasting  Hypothyroidism, post ablative:  She has had long-standing hypothyroidism and has normal levels of TSH but this will need to be followed periodically, TSH is high normal with her dose of 137 g, 2 tablets daily  Eloy Fehl 01/30/2015, 4:05 PM   Note: This office note was prepared with Estate agent. Any transcriptional errors that result from this process are unintentional.

## 2015-02-06 ENCOUNTER — Telehealth: Payer: Self-pay

## 2015-02-06 ENCOUNTER — Ambulatory Visit (INDEPENDENT_AMBULATORY_CARE_PROVIDER_SITE_OTHER): Payer: BLUE CROSS/BLUE SHIELD | Admitting: Internal Medicine

## 2015-02-06 ENCOUNTER — Encounter: Payer: Self-pay | Admitting: Internal Medicine

## 2015-02-06 VITALS — BP 134/84 | HR 88 | Ht 66.75 in | Wt 236.4 lb

## 2015-02-06 DIAGNOSIS — K219 Gastro-esophageal reflux disease without esophagitis: Secondary | ICD-10-CM | POA: Diagnosis not present

## 2015-02-06 DIAGNOSIS — K5100 Ulcerative (chronic) pancolitis without complications: Secondary | ICD-10-CM | POA: Diagnosis not present

## 2015-02-06 DIAGNOSIS — K5901 Slow transit constipation: Secondary | ICD-10-CM

## 2015-02-06 MED ORDER — NA SULFATE-K SULFATE-MG SULF 17.5-3.13-1.6 GM/177ML PO SOLN: 1.0000 | Freq: Once | ORAL | Status: DC

## 2015-02-06 NOTE — Progress Notes (Signed)
HISTORY OF PRESENT ILLNESS:  Lindsay Fry is a 55 y.o. female with multiple significant medical problems including, but not limited to orbital obesity, coronary artery disease with myocardial infarction August 2016 on Brilinta, breast cancer diagnosed April 2016, chronic constipation, GERD, and long-standing ulcerative colitis diagnosed in 1988. Former long-standing patient of Dr. Delfin Edis to her recent retirement. Available records have been reviewed (greater than one hour spent just reviewing records and pathology and endoscopy reports) available records have been requested. According to the patient she went many years without issues related to her colitis and was on no medication. Colonoscopy report from 2006 revealed rectosigmoid colitis and colon polyp. Pathology not available. Colonoscopy from 2008 revealed fair prep. Biopsies of the entire colon revealed melanosis but were otherwise negative without dysplasia. This is in contrast to an office note by Dr. Olevia Perches stating dysplasia present in 2008. Colonoscopy in 2012 with good preparation. She was found to have tubular adenoma with "low-grade dysplasia". She was found to have chronic quiescent colitis in the sigmoid region. Other biopsies unremarkable. No dysplasia. The polyp appeared to be sporadic and away from inflammatory bowel disease. Colonoscopy in 2016 with poor preparation. Incomplete exam. Biopsies revealed quiescent ulcer colitis and left colon. Tubular adenomas. No dysplasia on random biopsies. Patient has a family history of colon cancer in her father. Also family history of inflammatory bowel disease in her mother. Patient states that she's been on Lialda 1.2 g daily over the past year. She has chronic constipation despite Linzess and senna. She also has chronic GERD which is controlled with pantoprazole 40 mg twice daily. Her last office evaluation with Dr. Loletha Grayer was June 2016. He office note states that she had tubular adenoma and  low-grade dysplasia (it was tubular adenoma with low-grade dysplasia). Based on this, it was suggested the patient undergo colectomy. Patient is not interested. In any event, due to poor preparation follow-up colonoscopy was recommended sooner rather than later.  REVIEW OF SYSTEMS:  All non-GI ROS negative except for  Past Medical History  Diagnosis Date  . Rosacea   . Family history of malignant neoplasm of gastrointestinal tract   . Allergic rhinitis, cause unspecified   . Anxiety state, unspecified   . Unspecified asthma(493.90)   . Benign neoplasm of other and unspecified site of the digestive system   . Ulcerative colitis, unspecified   . Depression   . Fibromyalgia   . GERD (gastroesophageal reflux disease)   . Hiatal hernia   . Pure hypercholesterolemia   . Unspecified essential hypertension   . Unspecified hypothyroidism   . Insomnia, unspecified   . History of migraines   . Other screening mammogram   . Asymptomatic postmenopausal status (age-related) (natural)   . Unspecified vitamin D deficiency   . Edema   . Dysuria   . History of ovarian cyst   . Cyst     on Achilles tendon  . Tubular adenoma of colon 01/10/11  . Vertigo   . Subacute confusional state 11/19/2012  . ADD (attention deficit disorder) 11/19/2012  . OCD (obsessive compulsive disorder)   . Glaucoma   . History of alcoholism ([place])   . Bipolar disorder ([place])   . Diabetes mellitus ([place])     frank  . Atypical hyperplasia of left breast 2000  . Headache     migraines  . Arthritis   . Cancer ([place])     Left Breast 71m invasive CA left uiq, dcis left uoq and a lot ADH, ALH  in both breasts.  . Carcinoma of left breast ([place]) 06/02/2014  . CAD (coronary artery disease)     a. 08/2014 NSTEMI/Cath: LM nl, LAD 50p, D1/D2 min irregs, LCX nl, OM1 40, LPDA nl, RI 99ost (2.41m vessel->med Rx), RCA nl, AM 60, nl EF.  .Marland KitchenChronic diastolic CHF (congestive heart failure) ([place]     a. 08/2014 Echo: EF 60-65%, Gr 1 DD,  nl LA.  .[facility]    Past Surgical History  Procedure Laterality Date  . Wisdom tooth extraction  1980's  . Tonsillectomy  1984  . Cesarean section  1996/1997    placenta previa, gest DM, pre-eclampsia  . Nasal sinus surgery  05/1997  . Precancerous mole removed    . Cholecystectomy  1997    adhesions also  . Meckel diverticulum excision  1999  . Dexa  04/1999 and 2010    normal  . Colonoscopy  01/2001    Ulcerative colitis  . Esophagogastroduodenoscopy  09/2001    polyp  . Exercise stress test  11/2003    negative  . Nuclear stress test  06/2007    negative  . Colonoscopy  04/2004    UC, polyp  . Colonoscopy  12/08    UC, no polyps  . Sleep study  11/09    no apnea, but did snore ([facility])  . Bunionectomy Right   . Femur fracture surgery Left   . Appendectomy    . Tubal ligation    . Simple mastectomy with axillary sentinel node biopsy Bilateral 05/23/2014    Procedure: Bilateral simple mastectomy, left sentinel node biopsy ;  Surgeon: SChristene Lye MD;  Location: [facility];  Service: General;  Laterality: Bilateral;  . Axillary sentinel node biopsy Left 05/23/2014    Procedure: AXILLARY SENTINEL NODE BIOPSY;  Surgeon: SChristene Lye MD;  Location: [facility] ORS;  Service: General;  Laterality: Left;  .Marland KitchenMastectomy Bilateral 05-23-14    Dr. SJamal Collin . Breast biopsy  Aug 2000    on tamoxifen, atypical hyperplasia  . Breast surgery Left Aug 2000    lumpectomy/ Dr CSharlet Salina . Breast surgery Bilateral 05/23/14    Mastectomy  . Cardiac catheterization N/A 09/05/2014    Procedure: Left Heart Cath and Coronary Angiography;  Surgeon: MWellington Hampshire MD;  [facility];  Service: Cardiovascular;  Laterality: N/A;  . Cardiac catheterization  09-05-14  . [place] reports that she quit smoking about 22 years ago. Her smoking use included Cigarettes. She has a 1.25 pack-year  smoking history. She has never used smokeless tobacco. She reports that she does not drink alcohol or use illicit drugs.  family history includes Alzheimer's disease in her father; Colitis in her mother; Colon cancer in her father; Coronary artery disease in her father and mother; Crohn's disease in her mother; Dementia in her father; Hypertension in her mother; Osteoporosis in her mother; Stroke in her cousin. There is no history of Esophageal cancer, Rectal cancer, or Stomach cancer.  Allergies  Allergen Reactions  . Aspirin     REACTION: aggrivates colitis  . Crestor [Rosuvastatin]     REACTION: increased lfts  . Erythromycin     REACTION: GI upset  . Nsaids     REACTION: aggrivate colitis  . Oxycodone Anxiety       PHYSICAL EXAMINATION: Vital signs: BP 134/84 mmHg  Pulse 88  Ht 5' 6.75" (  1.695 m)  Wt 236 lb 6 oz (107.219 kg)  BMI 37.32 kg/m2  LMP 08/23/2006 (Approximate)  Constitutional:  obese, generally well-appearing, no acute distress Psychiatric: alert and oriented x3, cooperative Eyes: extraocular movements intact, anicteric, conjunctiva pink Mouth: oral pharynx moist, no lesions Neck: supple no lymphadenopathy Cardiovascular: heart regular rate and rhythm, no murmur Lungs: clear to auscultation bilaterally Abdomen: soft, obese,  nontender, nondistended, no obvious ascites, no peritoneal signs, normal bowel sounds, no organomegaly Rectal: deferred,  Extremities: no Clubbing cyanosis or lower extremity edema bilaterally Skin: no lesions on visible extremities Neuro: No focal deficits. . Cranial nerves intact No asterixis.  ASSESSMENT:  #1. Long-standing ulcerative colitis. Appears to be limited to left colon. The most part has done well. All pathology that I review does not show dysplasia from random colon biopsies #2. History of tubular adenomas. Appears be sporadic. Tubular adenomas are dysplastic by definition. This would not be an indication for colectomy #3.  Family history of colon cancer #4. Multiple medical problems including personal history of breast cancer #5. History of coronary artery disease on Brilinta   PLAN:  #1. Remote colonoscopies and pathology reports requested for additional review to make sure I am not missing anything #2. Continue Lialda #3. Schedule colonoscopy with biopsies. 2 day prep with MiraLAX and su prep. The patient is HIGH RISK given her comorbidities and the need to adjust/address medications as outlined.The nature of the procedure, as well as the risks, benefits, and alternatives were carefully and thoroughly reviewed with the patient. Ample time for discussion and questions allowed. The patient understood, was satisfied, and agreed to proceed. #4. Consult patient's cardiologist regarding the feasibility of holding Brilinta for her procedure #5. Adjust diabetic medications for her examination as instructed\ #6. Continue PPI for control of GERD

## 2015-02-06 NOTE — Telephone Encounter (Signed)
RE: Lindsay Fry DOB: 11/23/1960 MRN: [id number]   Dear Dr. Rockey Situ,    We have scheduled the above patient for an endoscopic procedure. Our records show that she is on anticoagulation therapy.   Please advise as to how long the patient may come off her therapy of Brilinta prior to the procedure, which is scheduled for 03/02/2015.  Please fax back/ or route the completed form to [place] at [phone].   Sincerely,    Phillis Haggis

## 2015-02-06 NOTE — Telephone Encounter (Signed)
Would continue aspirin Stop brilinta 5 days before procedure then restart after Will cc GI

## 2015-02-06 NOTE — Patient Instructions (Signed)
You have been scheduled for a colonoscopy. Please follow written instructions given to you at your visit today.  Please pick up your prep supplies at the pharmacy within the next 1-3 days. If you use inhalers (even only as needed), please bring them with you on the day of your procedure.

## 2015-02-07 ENCOUNTER — Inpatient Hospital Stay: Payer: BLUE CROSS/BLUE SHIELD | Attending: Oncology | Admitting: Oncology

## 2015-02-07 ENCOUNTER — Inpatient Hospital Stay: Payer: BLUE CROSS/BLUE SHIELD

## 2015-02-07 ENCOUNTER — Encounter: Payer: Self-pay | Admitting: Oncology

## 2015-02-07 VITALS — BP 144/86 | HR 90 | Temp 98.7°F | Wt 239.4 lb

## 2015-02-07 DIAGNOSIS — Z170 Estrogen receptor positive status [ER+]: Secondary | ICD-10-CM | POA: Insufficient documentation

## 2015-02-07 DIAGNOSIS — Z79899 Other long term (current) drug therapy: Secondary | ICD-10-CM | POA: Insufficient documentation

## 2015-02-07 DIAGNOSIS — E559 Vitamin D deficiency, unspecified: Secondary | ICD-10-CM | POA: Insufficient documentation

## 2015-02-07 DIAGNOSIS — E039 Hypothyroidism, unspecified: Secondary | ICD-10-CM | POA: Diagnosis not present

## 2015-02-07 DIAGNOSIS — E119 Type 2 diabetes mellitus without complications: Secondary | ICD-10-CM | POA: Diagnosis not present

## 2015-02-07 DIAGNOSIS — Z7982 Long term (current) use of aspirin: Secondary | ICD-10-CM | POA: Insufficient documentation

## 2015-02-07 DIAGNOSIS — J45909 Unspecified asthma, uncomplicated: Secondary | ICD-10-CM | POA: Insufficient documentation

## 2015-02-07 DIAGNOSIS — I252 Old myocardial infarction: Secondary | ICD-10-CM | POA: Diagnosis not present

## 2015-02-07 DIAGNOSIS — I2510 Atherosclerotic heart disease of native coronary artery without angina pectoris: Secondary | ICD-10-CM | POA: Insufficient documentation

## 2015-02-07 DIAGNOSIS — M1990 Unspecified osteoarthritis, unspecified site: Secondary | ICD-10-CM | POA: Insufficient documentation

## 2015-02-07 DIAGNOSIS — F429 Obsessive-compulsive disorder, unspecified: Secondary | ICD-10-CM | POA: Insufficient documentation

## 2015-02-07 DIAGNOSIS — E7800 Pure hypercholesterolemia, unspecified: Secondary | ICD-10-CM | POA: Insufficient documentation

## 2015-02-07 DIAGNOSIS — M797 Fibromyalgia: Secondary | ICD-10-CM | POA: Diagnosis not present

## 2015-02-07 DIAGNOSIS — Z79811 Long term (current) use of aromatase inhibitors: Secondary | ICD-10-CM | POA: Insufficient documentation

## 2015-02-07 DIAGNOSIS — Z7984 Long term (current) use of oral hypoglycemic drugs: Secondary | ICD-10-CM | POA: Insufficient documentation

## 2015-02-07 DIAGNOSIS — Z9013 Acquired absence of bilateral breasts and nipples: Secondary | ICD-10-CM | POA: Diagnosis not present

## 2015-02-07 DIAGNOSIS — K219 Gastro-esophageal reflux disease without esophagitis: Secondary | ICD-10-CM | POA: Insufficient documentation

## 2015-02-07 DIAGNOSIS — Z853 Personal history of malignant neoplasm of breast: Secondary | ICD-10-CM | POA: Diagnosis not present

## 2015-02-07 DIAGNOSIS — Z87891 Personal history of nicotine dependence: Secondary | ICD-10-CM | POA: Insufficient documentation

## 2015-02-07 DIAGNOSIS — Z794 Long term (current) use of insulin: Secondary | ICD-10-CM | POA: Insufficient documentation

## 2015-02-07 DIAGNOSIS — C50912 Malignant neoplasm of unspecified site of left female breast: Secondary | ICD-10-CM | POA: Insufficient documentation

## 2015-02-07 DIAGNOSIS — I5032 Chronic diastolic (congestive) heart failure: Secondary | ICD-10-CM | POA: Insufficient documentation

## 2015-02-07 DIAGNOSIS — I10 Essential (primary) hypertension: Secondary | ICD-10-CM | POA: Diagnosis not present

## 2015-02-07 LAB — CBC WITH DIFFERENTIAL/PLATELET
Basophils Absolute: 0.1 10*3/uL (ref 0–0.1)
Basophils Relative: 1 %
Eosinophils Absolute: 0.4 10*3/uL (ref 0–0.7)
Eosinophils Relative: 3 %
HCT: 45.4 % (ref 35.0–47.0)
Hemoglobin: 14.7 g/dL (ref 12.0–16.0)
Lymphocytes Relative: 15 %
Lymphs Abs: 1.8 10*3/uL (ref 1.0–3.6)
MCH: 28.2 pg (ref 26.0–34.0)
MCHC: 32.5 g/dL (ref 32.0–36.0)
MCV: 86.9 fL (ref 80.0–100.0)
Monocytes Absolute: 1 10*3/uL — ABNORMAL HIGH (ref 0.2–0.9)
Monocytes Relative: 8 %
Neutro Abs: 8.5 10*3/uL — ABNORMAL HIGH (ref 1.4–6.5)
Neutrophils Relative %: 73 %
Platelets: 484 10*3/uL — ABNORMAL HIGH (ref 150–440)
RBC: 5.22 MIL/uL — ABNORMAL HIGH (ref 3.80–5.20)
RDW: 15.3 % — ABNORMAL HIGH (ref 11.5–14.5)
WBC: 11.8 10*3/uL — ABNORMAL HIGH (ref 3.6–11.0)

## 2015-02-07 LAB — COMPREHENSIVE METABOLIC PANEL
ALT: 72 U/L — ABNORMAL HIGH (ref 14–54)
AST: 44 U/L — ABNORMAL HIGH (ref 15–41)
Albumin: 4.5 g/dL (ref 3.5–5.0)
Alkaline Phosphatase: 112 U/L (ref 38–126)
Anion gap: 11 (ref 5–15)
BUN: 15 mg/dL (ref 6–20)
CO2: 23 mmol/L (ref 22–32)
Calcium: 9.9 mg/dL (ref 8.9–10.3)
Chloride: 102 mmol/L (ref 101–111)
Creatinine, Ser: 0.86 mg/dL (ref 0.44–1.00)
GFR calc Af Amer: >60 mL/min (ref >60)
GFR calc non Af Amer: >60 mL/min (ref >60)
Glucose, Bld: 177 mg/dL — ABNORMAL HIGH (ref 65–99)
Potassium: 4.5 mmol/L (ref 3.5–5.1)
Sodium: 136 mmol/L (ref 135–145)
Total Bilirubin: 0.5 mg/dL (ref 0.3–1.2)
Total Protein: 8 g/dL (ref 6.5–8.1)

## 2015-02-07 NOTE — Progress Notes (Signed)
[place] @ [facility] [phone]  Fax:([phone] [place]: 09-29-1960  MR#: [id number]  YKD#:[id number]  Patient Care Team: Abner Greenspan, MD as PCP - General Elayne Snare, MD as Consulting Physician (Endocrinology) Chucky May, MD as Consulting Physician (Psychiatry) Rosina Lowenstein, MD (Obstetrics and Gynecology) Christene Lye, MD (General Surgery)  CHIEF COMPLAINT:  Chief Complaint  Patient presents with  . Breast Cancer    Oncology History   1.  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  Left breast: T1 b N0 M0 stage IB estrogen and progesterone receptor HER-2/neu Not overexpressed 2.    Multi-gene analysis with  MAMOPRINT low risk for recurrent disease Patient was started on letrozole and calcium and vitamin D (June, 2016)     Carcinoma of left breast ([place])   05/23/2014 Initial Diagnosis Carcinoma of left breast     INTERVAL HISTORY: 55 year old lady with a history of carcinoma breast status post bilateral mastectomy.  Patient is here for ongoing evaluation and treatment consideration. Multigene analysis was low risk. Patient had bilateral mastectomy.  Tolerating letrozole taking calcium and vitamin D. Here for further follow-up and treatment consideration  REVIEW OF SYSTEMS:   Moderately obese lady in no acute distress GENERAL:  Feels good.  Active.  No fevers, sweats or weight loss. PERFORMANCE STATUS (ECOG): 01 HEENT:  No visual changes, runny nose, sore throat,  mouth sores or tenderness. Lungs: No shortness of breath or cough.  No hemoptysis. Cardiac:  No chest pain, palpitations, orthopnea, or PND. GI:  No nausea, vomiting, diarrhea, constipation, melena or hematochezia. GU:  No urgency, frequency, dysuria, or hematuria. Musculoskeletal:  No back pain.  No joint pain.  No muscle tenderness. Extremities:  No pain or swelling. Skin:  No rashes or skin changes. Neuro:  No headache, numbness or weakness, balance or coordination issues. Endocrine:  No diabetes, thyroid issues, hot flashes or night sweats. Psych:  No mood changes, depression or anxiety. Pain:  No focal pain. Review of systems:  All other systems reviewed and found to be negative. As per HPI. Otherwise, a complete review of systems is negatve.  PAST MEDICAL HISTORY: Past Medical History  Diagnosis Date  . Rosacea   . Family history of malignant neoplasm of gastrointestinal tract   . Allergic rhinitis, cause unspecified   . Anxiety state, unspecified   . Unspecified asthma(493.90)   . Benign neoplasm of other and unspecified site of the digestive system   . Ulcerative colitis, unspecified   . Depression   . Fibromyalgia   . GERD (gastroesophageal reflux disease)   . Hiatal hernia   . Pure hypercholesterolemia   . Unspecified essential hypertension   . Unspecified hypothyroidism   . Insomnia, unspecified   . History of migraines   . Other screening mammogram   . Asymptomatic postmenopausal status (age-related) (natural)   . Unspecified vitamin D deficiency   . Edema   . Dysuria   . History of ovarian cyst   . Cyst     on Achilles  tendon  . Tubular adenoma of colon 01/10/11  . Vertigo   . Subacute confusional state 11/19/2012  . ADD (attention deficit disorder) 11/19/2012  . OCD (obsessive compulsive disorder)   . Glaucoma   . History of alcoholism ([place])   . Bipolar disorder ([place])   . Diabetes mellitus ([place])     frank  . Atypical hyperplasia of left breast 2000  .  Headache     migraines  . Arthritis   . Cancer ([place])     Left Breast 60m invasive CA left uiq, dcis left uoq and a lot ADH, ALH in both breasts.  . [place] of [place] breast [place] 06/02/2014  . CAD (coronary artery disease)     a. 08/2014 NSTEMI/Cath: LM nl, LAD 50p, D1/D2 min irregs, LCX nl, OM1 40, LPDA nl, RI 99ost (2.061mvessel->med Rx), RCA nl, AM 60, nl EF.  . Marland Kitchenhronic diastolic CHF (congestive heart failure) ([place]    a. 08/2014 Echo: EF 60-65%, Gr 1 DD, nl LA.  . [facility]    PAST SURGICAL HISTORY: Past Surgical History  Procedure Laterality Date  . Wisdom tooth extraction  1980's  . Tonsillectomy  1984  . Cesarean section  1996/1997    placenta previa, gest DM, pre-eclampsia  . Nasal sinus surgery  05/1997  . Precancerous mole removed    . Cholecystectomy  1997    adhesions also  . Meckel diverticulum excision  1999  . Dexa  04/1999 and 2010    normal  . Colonoscopy  01/2001    Ulcerative colitis  . Esophagogastroduodenoscopy  09/2001    polyp  . Exercise stress test  11/2003    negative  . Nuclear stress test  06/2007    negative  . Colonoscopy  04/2004    UC, polyp  . Colonoscopy  12/08    UC, no polyps  . Sleep study  11/09    no apnea, but did snore (done by [facility])  . Bunionectomy Right   . Femur fracture surgery Left   . Appendectomy    . Tubal ligation    . Simple mastectomy with axillary sentinel node biopsy Bilateral 05/23/2014    Procedure: Bilateral simple mastectomy, left sentinel node biopsy ;  Surgeon: SeChristene LyeMD;  Location: [facility] ORS;  Service: General;  Laterality: Bilateral;  . Axillary sentinel node biopsy Left 05/23/2014    Procedure: AXILLARY SENTINEL NODE BIOPSY;  Surgeon: SeChristene LyeMD;  Location: [facility] ORS;  Service: General;  Laterality: Left;  . Marland Kitchenastectomy Bilateral 05-23-14    Dr. SaJamal Collin. Breast biopsy  Aug 2000    on tamoxifen, atypical hyperplasia  . Breast surgery Left Aug 2000     lumpectomy/ Dr CrSharlet Salina. Breast surgery Bilateral 05/23/14    Mastectomy  . Cardiac catheterization N/A 09/05/2014    Procedure: Left Heart Cath and Coronary Angiography;  Surgeon: MuWellington HampshireMD;  [facility];  Service: Cardiovascular;  Laterality: N/A;  . Cardiac catheterization  09-05-14  . Cholecystect  1999    FAMILY HISTORY Family History  Problem Relation Age of Onset  . Coronary artery disease Father   . Colon cancer Father   . Alzheimer's disease Father   . Dementia Father   . Coronary artery disease Mother   . Hypertension Mother   . Osteoporosis Mother   . Colitis Mother   . Crohn's disease Mother   . Breast cancer  great aunts  . Coronary artery disease      Uncle (also AAA)  . Diabetes      remote family history  . Stroke Cousin   . Esophageal cancer Neg Hx   . Rectal cancer Neg Hx   . Stomach cancer Neg Hx     ADVANCED DIRECTIVES:  No flowsheet data found.  HEALTH MAINTENANCE: Social History  Substance Use Topics  . Smoking status: Former Smoker -- 0.25 packs/day for 5 years    Types: Cigarettes    Quit date: 01/14/1993  . Smokeless tobacco: Never Used  . Alcohol Use: No     Comment: Recovered ETOH      Allergies  Allergen Reactions  . Aspirin     REACTION: aggrivates colitis  . Crestor [Rosuvastatin]     REACTION: increased lfts  . Erythromycin     REACTION: GI upset  . Nsaids     REACTION: aggrivate colitis  . Oxycodone Anxiety    Current Outpatient Prescriptions  Medication Sig Dispense Refill  . Asenapine Maleate (SAPHRIS) 10 MG SUBL Place 20 mg under the tongue at bedtime.    Marland Kitchen aspirin 81 MG tablet Take 81 mg by mouth daily.    Marland Kitchen atorvastatin (LIPITOR) 80 MG tablet TAKE 1 TABLET(80 MG) BY MOUTH DAILY 30 tablet 6  . b complex vitamins capsule Take 1 capsule by mouth daily.    Marland Kitchen buPROPion (WELLBUTRIN XL) 150 MG 24 hr tablet Take 450 mg by mouth daily.     . Calcium 600-200 MG-UNIT per tablet Take 1 tablet  by mouth 2 (two) times daily. 60 tablet 6  . chlorproMAZINE (THORAZINE) 25 MG tablet Take 25-50 mg by mouth as needed. For migraines if frova doesn't help.    . Cholecalciferol (VITAMIN D3) 2000 UNITS TABS Take 1 tablet by mouth daily.    . cloNIDine (CATAPRES) 0.1 MG tablet TAKE 1 TABLET BY MOUTH TWICE DAILY (Patient taking differently: TAKE 1/2 TABLET BY MOUTH TWICE DAILY) 60 tablet 11  . co-enzyme Q-10 50 MG capsule Take 50 mg by mouth daily.    . cyclobenzaprine (FLEXERIL) 10 MG tablet TAKE 2 TABLETS BY MOUTH EVERY NIGHT AT BEDTIME 180 tablet 1  . diazepam (VALIUM) 10 MG tablet Take 2.5-10 mg by mouth as needed for anxiety.    Marland Kitchen diltiazem (CARDIZEM CD) 240 MG 24 hr capsule Take 1 capsule (240 mg total) by mouth daily. 90 capsule 3  . diphenhydrAMINE (BENADRYL) 50 MG tablet Take 100 mg by mouth at bedtime as needed (anxiety).     . fluticasone (FLONASE) 50 MCG/ACT nasal spray USE 2 SPRAYS IN EACH NOSTRIL TWICE DAILY 16 g 5  . gabapentin (NEURONTIN) 300 MG capsule TAKE 1 CAPSULE(300 MG) BY MOUTH THREE TIMES DAILY 90 capsule 11  . glucose blood (ONETOUCH VERIO) test strip Use as instructed to check blood sugar twice a day 100 each 3  . Insulin Pen Needle 31G X 5 MM MISC Use once a day with Victoza pen 30 each 3  . lamoTRIgine (LAMICTAL) 200 MG tablet Take 400 mg by mouth daily.    Marland Kitchen latanoprost (XALATAN) 0.005 % ophthalmic solution Place 1 drop into both eyes At bedtime.    Marland Kitchen letrozole (FEMARA) 2.5 MG tablet Take 1 tablet (2.5 mg total) by mouth daily. 30 tablet 6  . Linaclotide (LINZESS) 290 MCG CAPS capsule Take 1 capsule (290 mcg total) by mouth daily. 30 capsule 6  . Liniments (SALONPAS PAIN RELIEF PATCH EX) Apply topically as  needed.    . mesalamine (LIALDA) 1.2 G EC tablet Take 1 tablet (1.2 g total) by mouth daily with breakfast. 30 tablet 6  . metFORMIN (GLUCOPHAGE-XR) 500 MG 24 hr tablet Take 3 tablets (1,500 mg total) by mouth daily with supper. 90 tablet 3  . Multiple Vitamin  (MULTIVITAMIN) tablet Take 1 tablet by mouth daily.      . Na Sulfate-K Sulfate-Mg Sulf SOLN Take 1 kit by mouth once. 354 mL 0  . nebivolol (BYSTOLIC) 10 MG tablet Take 1 tablet (10 mg total) by mouth 2 (two) times daily. 180 tablet 1  . nitrofurantoin (MACRODANTIN) 100 MG capsule Take 100 mg by mouth as directed. With sexual activity     . Omega-3 Krill Oil 500 MG CAPS Take 500 mg by mouth.    . pantoprazole (PROTONIX) 40 MG tablet TAKE 1 TABLET BY MOUTH TWICE DAILY 60 tablet 0  . Probiotic Product (PROBIOTIC DAILY PO) Take by mouth.    . promethazine (PHENERGAN) 25 MG tablet Take 1 tablet (25 mg total) by mouth every 6 (six) hours as needed for nausea or vomiting. 30 tablet 0  . senna-docusate (SENOKOT-S) 8.6-50 MG tablet Take 3 tablets by mouth daily.    . sertraline (ZOLOFT) 25 MG tablet Take 25 mg by mouth daily.    . Simethicone (GAS-X PO) Take 1 tablet by mouth 4 (four) times daily.    Marland Kitchen spironolactone (ALDACTONE) 25 MG tablet Take 25 mg by mouth 2 (two) times daily.      Marland Kitchen SYNTHROID 137 MCG tablet TAKE 2 TABLETS BY MOUTH DAILY BEFORE BREAKFAST. 60 tablet 5  . ticagrelor (BRILINTA) 90 MG TABS tablet Take 1 tablet (90 mg total) by mouth 2 (two) times daily. 180 tablet 3  . tiZANidine (ZANAFLEX) 4 MG tablet Take 4 mg by mouth. Take 1 by mouth as needed for mirgaine, may repeat after 2 hours if no relief    . valACYclovir (VALTREX) 500 MG tablet Take 500 mg by mouth 2 (two) times daily.      Marland Kitchen VICTOZA 18 MG/3ML SOPN Inject 0.2 mLs (1.2 mg total) into the skin daily. Inject once daily at the same time 2 pen 3  . vitamin C (ASCORBIC ACID) 500 MG tablet Take 250 mg by mouth daily. Takes 2 a day    . zolpidem (AMBIEN) 10 MG tablet Take 10 mg by mouth at bedtime.     No current facility-administered medications for this visit.    OBJECTIVE:  Filed Vitals:   02/07/15 1511  BP: 144/86  Pulse: 90  Temp: 98.7 F (37.1 C)     Body mass index is 37.8 kg/(m^2).    ECOG FS:1 - Symptomatic but  completely ambulatory  PHYSICAL EXAM: GENERAL:  Well developed, well nourished, sitting comfortably in the exam room in no acute distress. MENTAL STATUS:  Alert and oriented to person, place and time. HEAD:    Normocephalic, atraumatic, face symmetric, no Cushingoid features. EYES:  .  Pupils equal round and reactive to light and accomodation.  No conjunctivitis or scleral icterus. ENT:  Oropharynx clear without lesion.  Tongue normal. Mucous membranes moist.  RESPIRATORY:  Clear to auscultation without rales, wheezes or rhonchi. CARDIOVASCULAR:  Regular rate and rhythm without murmur, rub or gallop. BREAST:  Patient had bilateral mastectomy ABDOMEN:  Soft, non-tender, with active bowel sounds, and no hepatosplenomegaly.  No masses. BACK:  No CVA tenderness.  No tenderness on percussion of the back or rib cage. SKIN:  No  rashes, ulcers or lesions. EXTREMITIES: No edema, no skin discoloration or tenderness.  No palpable cords. LYMPH NODES: No palpable cervical, supraclavicular, axillary or inguinal adenopathy  NEUROLOGICAL: Unremarkable. PSYCH:  Appropriate.   LAB RESULTS:  Appointment on 02/07/2015  Component Date Value Ref Range Status  . WBC 02/07/2015 11.8* 3.6 - 11.0 K/uL Final  . RBC 02/07/2015 5.22* 3.80 - 5.20 MIL/uL Final  . Hemoglobin 02/07/2015 14.7  12.0 - 16.0 g/dL Final  . HCT 02/07/2015 45.4  35.0 - 47.0 % Final  . MCV 02/07/2015 86.9  80.0 - 100.0 fL Final  . MCH 02/07/2015 28.2  26.0 - 34.0 pg Final  . MCHC 02/07/2015 32.5  32.0 - 36.0 g/dL Final  . RDW 02/07/2015 15.3* 11.5 - 14.5 % Final  . Platelets 02/07/2015 484* 150 - 440 K/uL Final  . Neutrophils Relative % 02/07/2015 73   Final  . Neutro Abs 02/07/2015 8.5* 1.4 - 6.5 K/uL Final  . Lymphocytes Relative 02/07/2015 15   Final  . Lymphs Abs 02/07/2015 1.8  1.0 - 3.6 K/uL Final  . Monocytes Relative 02/07/2015 8   Final  . Monocytes Absolute 02/07/2015 1.0* 0.2 - 0.9 K/uL Final  . Eosinophils Relative  02/07/2015 3   Final  . Eosinophils Absolute 02/07/2015 0.4  0 - 0.7 K/uL Final  . Basophils Relative 02/07/2015 1   Final  . Basophils Absolute 02/07/2015 0.1  0 - 0.1 K/uL Final  . Sodium 02/07/2015 136  135 - 145 mmol/L Final  . Potassium 02/07/2015 4.5  3.5 - 5.1 mmol/L Final  . Chloride 02/07/2015 102  101 - 111 mmol/L Final  . CO2 02/07/2015 23  22 - 32 mmol/L Final  . Glucose, Bld 02/07/2015 177* 65 - 99 mg/dL Final  . BUN 02/07/2015 15  6 - 20 mg/dL Final  . Creatinine, Ser 02/07/2015 0.86  0.44 - 1.00 mg/dL Final  . Calcium 02/07/2015 9.9  8.9 - 10.3 mg/dL Final  . Total Protein 02/07/2015 8.0  6.5 - 8.1 g/dL Final  . Albumin 02/07/2015 4.5  3.5 - 5.0 g/dL Final  . AST 02/07/2015 44* 15 - 41 U/L Final  . ALT 02/07/2015 72* 14 - 54 U/L Final  . Alkaline Phosphatase 02/07/2015 112  38 - 126 U/L Final  . Total Bilirubin 02/07/2015 0.5  0.3 - 1.2 mg/dL Final  . GFR calc non Af Amer 02/07/2015 >60  >60 mL/min Final  . GFR calc Af Amer 02/07/2015 >60  >60 mL/min Final   Comment: (NOTE) The eGFR has been calculated using the CKD EPI equation. This calculation has not been validated in all clinical situations. eGFR's persistently <60 mL/min signify possible Chronic Kidney Disease.   . Anion gap 02/07/2015 11  5 - 15 Final      STUDIES: Dg Abd 2 Views  01/19/2015  CLINICAL DATA:  Intermittent vomiting and constipation. EXAM: ABDOMEN - 2 VIEW COMPARISON:  None. FINDINGS: The bowel gas pattern is normal. There is no evidence of free air. No radio-opaque calculi or other significant radiographic abnormality is seen. Levoscoliosis of the thoracolumbar spine. IMPRESSION: No evidence of bowel obstruction. Electronically Signed   By: Kathreen Devoid   On: 01/19/2015 08:22    ASSESSMENT: Cancer  of breast status post bilateral mastectomy Multi-gene analysis  low risk Recent is tolerating letrozole very well.  Continuing calcium and vitamin D Bone density did not reveal any evidence of  osteopenia or osteoporosis MEDICAL DECISION MAKING:  Start patient on letrozole.  Calcium  and vitamin D Repeat bone density next year Lab data has been reviewed there is no abnormality detected  Patient expressed understanding and was in agreement with this plan. She also understands that She can call clinic at any time with any questions, concerns, or complaints.    Carcinoma of left breast   Staging form: Breast, AJCC 7th Edition     Clinical: Stage IA (T1b, N0, M0) - Unsigned       Staging comments: Kidney frequent changes in the 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  No invasive cancer in the right breast.  Status post bilateral mastectomy    Forest Gleason, MD   02/07/2015 3:21 PM

## 2015-02-07 NOTE — Telephone Encounter (Signed)
Told patient that, per Dr. Rockey Situ, she was to stop her Brilinta 5 days prior to her procedure and restart after.  She is to stay on her aspirin.  Patient acknowledged and understood.

## 2015-02-14 ENCOUNTER — Encounter: Payer: Self-pay | Admitting: Internal Medicine

## 2015-02-16 ENCOUNTER — Other Ambulatory Visit: Payer: Self-pay | Admitting: Internal Medicine

## 2015-02-16 ENCOUNTER — Other Ambulatory Visit: Payer: Self-pay | Admitting: Oncology

## 2015-02-17 ENCOUNTER — Encounter: Payer: Self-pay | Admitting: Internal Medicine

## 2015-02-17 ENCOUNTER — Other Ambulatory Visit: Payer: Self-pay

## 2015-02-22 ENCOUNTER — Other Ambulatory Visit: Payer: Self-pay | Admitting: *Deleted

## 2015-02-22 MED ORDER — LETROZOLE 2.5 MG PO TABS: 2.5000 mg | ORAL_TABLET | Freq: Every day | ORAL | Status: DC

## 2015-02-25 ENCOUNTER — Encounter: Payer: Self-pay | Admitting: Internal Medicine

## 2015-03-01 ENCOUNTER — Telehealth: Payer: Self-pay

## 2015-03-01 ENCOUNTER — Encounter: Payer: Self-pay | Admitting: *Deleted

## 2015-03-01 MED ORDER — LINACLOTIDE 290 MCG PO CAPS: 290.0000 ug | ORAL_CAPSULE | Freq: Every day | ORAL | Status: DC

## 2015-03-01 NOTE — Telephone Encounter (Signed)
Refilled Linzess

## 2015-03-01 NOTE — Telephone Encounter (Signed)
-----   Message from Irene Shipper, MD sent at 02/28/2015 11:20 AM EST ----- Faythe Ghee, though she stated that this did not work. Please make sure that this is all converted to a phone note so that it may be referred to in the future if needed. Thank you ----- Message -----    From: Audrea Muscat, CMA    Sent: 02/28/2015  11:05 AM      To: Irene Shipper, MD  Previous Dr. Olevia Perches patient seen by you 02/06/2015.  Patient requesting refill of Linzess 290, given to her in the past by Dr. Olevia Perches but not mentioned by you in office note.  Ok to refill?

## 2015-03-02 ENCOUNTER — Ambulatory Visit (AMBULATORY_SURGERY_CENTER): Payer: BLUE CROSS/BLUE SHIELD | Admitting: Internal Medicine

## 2015-03-02 ENCOUNTER — Encounter: Payer: Self-pay | Admitting: Internal Medicine

## 2015-03-02 VITALS — BP 156/66 | HR 77 | Resp 19

## 2015-03-02 DIAGNOSIS — Z1211 Encounter for screening for malignant neoplasm of colon: Secondary | ICD-10-CM

## 2015-03-02 DIAGNOSIS — K5100 Ulcerative (chronic) pancolitis without complications: Secondary | ICD-10-CM | POA: Diagnosis not present

## 2015-03-02 DIAGNOSIS — D122 Benign neoplasm of ascending colon: Secondary | ICD-10-CM

## 2015-03-02 DIAGNOSIS — D124 Benign neoplasm of descending colon: Secondary | ICD-10-CM | POA: Diagnosis not present

## 2015-03-02 DIAGNOSIS — D123 Benign neoplasm of transverse colon: Secondary | ICD-10-CM

## 2015-03-02 DIAGNOSIS — D125 Benign neoplasm of sigmoid colon: Secondary | ICD-10-CM | POA: Diagnosis not present

## 2015-03-02 MED ORDER — SODIUM CHLORIDE 0.9 % IV SOLN: 500.0000 mL | INTRAVENOUS | Status: DC

## 2015-03-02 NOTE — Patient Instructions (Signed)
YOU HAD AN ENDOSCOPIC PROCEDURE TODAY AT [facility]:   Refer to the procedure report that was given to you for any specific questions about what was found during the examination.  If the procedure report does not answer your questions, please call your gastroenterologist to clarify.  If you requested that your care partner not be given the details of your procedure findings, then the procedure report has been included in a sealed envelope for you to review at your convenience later.  YOU SHOULD EXPECT: Some feelings of bloating in the abdomen. Passage of more gas than usual.  Walking can help get rid of the air that was put into your GI tract during the procedure and reduce the bloating. If you had a lower endoscopy (such as a colonoscopy or flexible sigmoidoscopy) you may notice spotting of blood in your stool or on the toilet paper. If you underwent a bowel prep for your procedure, you may not have a normal bowel movement for a few days.  Please Note:  You might notice some irritation and congestion in your nose or some drainage.  This is from the oxygen used during your procedure.  There is no need for concern and it should clear up in a day or so.  SYMPTOMS TO REPORT IMMEDIATELY:   Following lower endoscopy (colonoscopy or flexible sigmoidoscopy):  Excessive amounts of blood in the stool  Significant tenderness or worsening of abdominal pains  Swelling of the abdomen that is new, acute  Fever of 100F or higher  For urgent or emergent issues, a gastroenterologist can be reached at any hour by calling [phone].   DIET: Your first meal following the procedure should be a small meal and then it is ok to progress to your normal diet. Heavy or fried foods are harder to digest and may make you feel nauseous or bloated.  Likewise, meals heavy in dairy and vegetables can increase bloating.  Drink plenty of fluids but you should avoid alcoholic beverages for 24  hours.  ACTIVITY:  You should plan to take it easy for the rest of today and you should NOT DRIVE or use heavy machinery until tomorrow (because of the sedation medicines used during the test).    FOLLOW UP: Our staff will call the number listed on your records the next business day following your procedure to check on you and address any questions or concerns that you may have regarding the information given to you following your procedure. If we do not reach you, we will leave a message.  However, if you are feeling well and you are not experiencing any problems, there is no need to return our call.  We will assume that you have returned to your regular daily activities without incident.  If any biopsies were taken you will be contacted by phone or by letter within the next 1-3 weeks.  Please call us at [phone] if you have not heard about the biopsies in 3 weeks.    SIGNATURES/CONFIDENTIALITY: You and/or your care partner have signed paperwork which will be entered into your electronic medical record.  These signatures attest to the fact that that the information above on your After Visit Summary has been reviewed and is understood.  Full responsibility of the confidentiality of this discharge information lies with you and/or your care-partner.  Resume Brilinta today. Next colonoscopy determined by pathology results. Please review polyp handout provided. Office appointment 4-6 weeks.

## 2015-03-02 NOTE — Progress Notes (Signed)
Report to PACU, RN, vss, BBS= Clear.  

## 2015-03-02 NOTE — Progress Notes (Signed)
Called to room to assist during endoscopic procedure.  Patient ID and intended procedure confirmed with present staff. Received instructions for my participation in the procedure from the performing physician.  

## 2015-03-02 NOTE — Op Note (Signed)
[place]  [address]. [place], [address]   COLONOSCOPY PROCEDURE REPORT  PATIENT: Lindsay Fry, Lindsay Fry  MR#: [id number] BIRTHDATE: May 18, 1960 , 54  yrs. old GENDER: female ENDOSCOPIST: Eustace Quail, MD REFERRED BY:.  Self / Office PROCEDURE DATE:  03/02/2015 PROCEDURE:   Colonoscopy, surveillance and Colonoscopy with snare polypectomy x 7  ASA CLASS:   Class III INDICATIONS:Long-standing ulcerative('88) colitis patient of Dr. Olevia Perches. Has undergone multiple colonoscopies. Last examination 2016 with multiple adenomas, poor prep, and incomplete exam. No active colitis or dysplasia on surveillance biopsies that were obtained.  MEDICATIONS: Monitored anesthesia care and Propofol 300 mg IV DESCRIPTION OF PROCEDURE:   After the risks benefits and alternatives of the procedure were thoroughly explained, informed consent was obtained.  The digital rectal exam revealed no abnormalities of the rectum.   The LB [id number]  endoscope was introduced through the anus and advanced to the cecum, which was identified by both the appendix and ileocecal valve. No adverse events experienced.   The quality of the prep was excellent.  (2 day prep including Suprep was used)  The instrument was then slowly withdrawn as the colon was fully examined. Estimated blood loss is zero unless otherwise noted in this procedure report.  COLON FINDINGS: The colonoscope was advanced to the cecal tip. Preparation was excellent with several formed pills in the cecal base.  There was a 15 mm sessile polyp in the ascending colon which was raised with saline and removed in 1 piece with cold snare.  In addition, there were 2 polyps in the transverse , 3 polyps in the descending , and one polyp in the sigmoid colon.  These measured between 4 and 7 mm and were all removed with cold snare.  All pathology submitted for analysis.  No gross active colitis. Retroflexed views revealed no  abnormalities. Time to cecum = 4.1 Withdrawal = 22.7   The scope was withdrawn and the procedure completed. COMPLICATIONS: There were no immediate complications. ENDOSCOPIC IMPRESSION: 1. Multiple polyps as described status post multiple polypectomies 2. Long-standing also colitis.  No gross active colitis today RECOMMENDATIONS: 1.  Await pathology results 2.  Call office for follow-up appointment in 4-6 weeks to discuss results and long-term plans 3. Resume Brilinta today  eSigned:  Eustace Quail, MD 03/02/2015 12:00 PM   cc: The Patient and Abner Greenspan, MD   PATIENT NAME:  Lindsay Fry, Lindsay Fry MR#: [id number]

## 2015-03-03 ENCOUNTER — Telehealth: Payer: Self-pay

## 2015-03-03 NOTE — Telephone Encounter (Signed)
  Follow up Call-  Call back number 03/02/2015 03/08/2014  Post procedure Call Back phone  # [phone]  Permission to leave phone message Yes Yes     Patient was called for follow up after procedure on 03/02/2015. No answer at the number given for follow up phone call. A message was left on the answering machine.

## 2015-03-14 ENCOUNTER — Encounter: Payer: Self-pay | Admitting: Internal Medicine

## 2015-03-15 ENCOUNTER — Other Ambulatory Visit: Payer: Self-pay | Admitting: Family Medicine

## 2015-03-16 NOTE — Telephone Encounter (Signed)
Will refill electronically  

## 2015-03-16 NOTE — Telephone Encounter (Signed)
Last refilled on 09/13/14 #180 with 1 additional refills, please advise

## 2015-03-17 ENCOUNTER — Telehealth: Payer: Self-pay

## 2015-03-17 MED ORDER — MESALAMINE 1.2 G PO TBEC: 1.2000 g | DELAYED_RELEASE_TABLET | Freq: Every day | ORAL | Status: DC

## 2015-03-17 NOTE — Telephone Encounter (Signed)
Refilled Lialda

## 2015-03-20 ENCOUNTER — Ambulatory Visit (INDEPENDENT_AMBULATORY_CARE_PROVIDER_SITE_OTHER): Payer: BLUE CROSS/BLUE SHIELD | Admitting: Family Medicine

## 2015-03-20 ENCOUNTER — Encounter: Payer: Self-pay | Admitting: Family Medicine

## 2015-03-20 VITALS — BP 146/84 | HR 107 | Temp 98.4°F | Wt 230.0 lb

## 2015-03-20 DIAGNOSIS — R05 Cough: Secondary | ICD-10-CM

## 2015-03-20 DIAGNOSIS — R059 Cough, unspecified: Secondary | ICD-10-CM | POA: Insufficient documentation

## 2015-03-20 DIAGNOSIS — J209 Acute bronchitis, unspecified: Secondary | ICD-10-CM

## 2015-03-20 MED ORDER — AMOXICILLIN-POT CLAVULANATE 875-125 MG PO TABS: 1.0000 | ORAL_TABLET | Freq: Two times a day (BID) | ORAL | Status: DC

## 2015-03-20 MED ORDER — ALBUTEROL SULFATE (2.5 MG/3ML) 0.083% IN NEBU
2.5000 mg | INHALATION_SOLUTION | Freq: Once | RESPIRATORY_TRACT | Status: AC
  Administered 2015-03-20: 2.5 mg via RESPIRATORY_TRACT

## 2015-03-20 MED ORDER — PREDNISONE 10 MG PO TABS: ORAL_TABLET | ORAL | Status: DC

## 2015-03-20 NOTE — Assessment & Plan Note (Signed)
With rhonchi noted on exam.  Nontoxic.  D/w pt. Would extend augmentin, continue pred course/taper, start using spacer with SABA HFA and f/u prn. Still okay for outpatient f/u, patient agrees.  F/u prn.  Routed to PCP as FYI.

## 2015-03-20 NOTE — Patient Instructions (Signed)
Extend the augmentin and prednisone.  Take the prednisone with food.  4-4-3-3-2-2-1-1 (2 days at each dose). Use the spacer with albuterol.  Take care.  Glad to see you.

## 2015-03-20 NOTE — Progress Notes (Signed)
Pre visit review using our clinic review tool, if applicable. No additional management support is needed unless otherwise documented below in the visit note.  [facility].  Started on augmentin.  She had SOB and chest congestion at that point.  Since then she was seen at [facility] for neb tx and CXR on 03/17/15.  Per patient she had more cough with sputum after neb tx at [facility].  Per patient, CXR then was w/o PNA.  She still hears a rattle and crackle in the chest with the cough.  No fevers now.  Not lightheaded but still feels weak.  She normally isn't on albuterol at home.  Her sputum, when productive, was greenish.  Discolored rhinorrhea.    Meds, vitals, and allergies reviewed.   ROS: See HPI.  Otherwise, noncontributory.  GEN: nad, alert and oriented HEENT: mucous membranes moist, tm w/o erythema, nasal exam w/o erythema, discolored discharge noted,  OP with cobblestoning NECK: supple w/o LA CV: rrr.   PULM: diffuse exp rhonchi B, w/o focal dec in BS, no inc wob, still speaking in complete sentences.  EXT: no edema  Recheck exam dec in rhonchi with inc air movement after neb tx with SABA.  No ADE on med. Nontoxic appearing.

## 2015-03-30 ENCOUNTER — Ambulatory Visit: Payer: BLUE CROSS/BLUE SHIELD | Admitting: Internal Medicine

## 2015-03-31 ENCOUNTER — Ambulatory Visit: Payer: BLUE CROSS/BLUE SHIELD | Admitting: Endocrinology

## 2015-04-14 ENCOUNTER — Ambulatory Visit (INDEPENDENT_AMBULATORY_CARE_PROVIDER_SITE_OTHER): Payer: BLUE CROSS/BLUE SHIELD | Admitting: Internal Medicine

## 2015-04-14 ENCOUNTER — Encounter: Payer: Self-pay | Admitting: Internal Medicine

## 2015-04-14 VITALS — BP 122/98 | HR 82 | Ht 66.0 in | Wt 242.4 lb

## 2015-04-14 DIAGNOSIS — K5100 Ulcerative (chronic) pancolitis without complications: Secondary | ICD-10-CM | POA: Diagnosis not present

## 2015-04-14 DIAGNOSIS — D124 Benign neoplasm of descending colon: Secondary | ICD-10-CM

## 2015-04-14 NOTE — Patient Instructions (Addendum)
If you are age 57 or older, your body mass index should be between 23-30. Your Body mass index is 39.14 kg/(m^2). If this is out of the aforementioned range listed, please consider follow up with your Primary Care Provider.  If you are age 66 or younger, your body mass index should be between 19-25. Your Body mass index is 39.14 kg/(m^2). If this is out of the aformentioned range listed, please consider follow up with your Primary Care Provider.   You will be due for a recall colonoscopy in 02/2016. We will send you a reminder in the mail when it gets closer to that time.

## 2015-04-14 NOTE — Progress Notes (Signed)
HISTORY OF PRESENT ILLNESS:  Lindsay Fry is a 55 y.o. female with MULTIPLE SIGNIFICANT medical problems as listed below, history of breast cancer, chronic anticoagulation, and long-standing ulcerative colitis diagnosed in 1988 felt to be left-sided for the most part based on data review. There is a family history of colon cancer in her father. Prior patient of Dr. Olevia Perches. Initially evaluated by myself 02/06/2015. See that dictation for details. Has been on Lialda monotherapy. Because of inadequate and incomplete colonoscopy in 2016 she recently underwent repeat colonoscopy 03/02/2015. She was found to have multiple (6) adenomas measuring between 4 and 15 mm. She presents today for follow-up to review the results of colonoscopy as well as address concerns over the potential to develop colon cancer. She is accompanied by her husband. No new clinical complaints. Continues with occasional gas, bloating, and loose stools. Currently on Lialda 1.2 g daily.  REVIEW OF SYSTEMS:  All non-GI ROS negative except for allergy, anxiety, arthritis, back pain, depression, fatigue, night sweats, dry mouth, shortness of breath  Past Medical History  Diagnosis Date  . Rosacea   . Family history of malignant neoplasm of gastrointestinal tract   . Allergic rhinitis, cause unspecified   . Anxiety state, unspecified   . Unspecified asthma(493.90)   . Benign neoplasm of other and unspecified site of the digestive system   . Ulcerative colitis, unspecified   . Depression   . Fibromyalgia   . GERD (gastroesophageal reflux disease)   . Hiatal hernia   . Pure hypercholesterolemia   . Unspecified essential hypertension   . Unspecified hypothyroidism   . Insomnia, unspecified   . History of migraines   . Other screening mammogram   . Asymptomatic postmenopausal status (age-related) (natural)   . Unspecified vitamin D deficiency   . Edema   . Dysuria   . History of ovarian cyst   . Cyst     on Achilles  tendon  . Tubular adenoma of colon 01/10/11  . Vertigo   . Subacute confusional state 11/19/2012  . ADD (attention deficit disorder) 11/19/2012  . OCD (obsessive compulsive disorder)   . Glaucoma   . History of alcoholism ([place])   . Bipolar disorder ([place])   . Diabetes mellitus ([place])     frank  . Atypical hyperplasia of left breast 2000  . Headache     migraines  . Arthritis   . Breast cancer ([place])     Left Breast 16mm invasive CA left uiq, dcis left uoq and a lot ADH, ALH in both breasts.  . Carcinoma of left breast ([place]) 06/02/2014  . CAD (coronary artery disease)     a. 08/2014 NSTEMI/Cath: LM nl, LAD 50p, D1/D2 min irregs, LCX nl, OM1 40, LPDA nl, RI 99ost (2.77mm vessel->med Rx), RCA nl, AM 60, nl EF.  Marland Kitchen Chronic diastolic CHF (congestive heart failure) ([place])     a. 08/2014 Echo: EF 60-65%, Gr 1 DD, nl LA.  Marland Kitchen Myocardial infarction [facility]) 09-04-14    Past Surgical History  Procedure Laterality Date  . Wisdom tooth extraction  1980's  . Tonsillectomy  1984  . Cesarean section  1996/1997    placenta previa, gest DM, pre-eclampsia  . Nasal sinus surgery  05/1997  . Precancerous mole removed    . Cholecystectomy  1997    adhesions also  . Meckel diverticulum excision  1999  . Dexa  04/1999 and 2010    normal  . Colonoscopy  01/2001    Ulcerative colitis  .  Esophagogastroduodenoscopy  09/2001    polyp  . Exercise stress test  11/2003    negative  . Nuclear stress test  06/2007    negative  . Colonoscopy  04/2004    UC, polyp  . Colonoscopy  12/08    UC, no polyps  . Sleep study  11/09    no apnea, but did snore (done by [facility])  . Bunionectomy Right   . Femur fracture surgery Left   . Appendectomy    . Tubal ligation    . Simple mastectomy with axillary sentinel node biopsy Bilateral 05/23/2014    Procedure: Bilateral simple mastectomy, left sentinel node biopsy ;  Surgeon: Christene Lye, MD;  Location: [facility] ORS;  Service: General;  Laterality: Bilateral;  . Axillary  sentinel node biopsy Left 05/23/2014    Procedure: AXILLARY SENTINEL NODE BIOPSY;  Surgeon: Christene Lye, MD;  Location: [facility] ORS;  Service: General;  Laterality: Left;  Marland Kitchen Mastectomy Bilateral 05-23-14    Dr. Jamal Collin  . Breast biopsy  Aug 2000    on tamoxifen, atypical hyperplasia  . Breast surgery Left Aug 2000    lumpectomy/ Dr Sharlet Salina  . Breast surgery Bilateral 05/23/14    Mastectomy  . Cardiac catheterization N/A 09/05/2014    Procedure: Left Heart Cath and Coronary Angiography;  Surgeon: Wellington Hampshire, MD;  Location: [facility];  Service: Cardiovascular;  Laterality: N/A;  . Cardiac catheterization  09-05-14  . Sonoita  reports that she quit smoking about 22 years ago. Her smoking use included Cigarettes. She has a 1.25 pack-year smoking history. She has never used smokeless tobacco. She reports that she does not drink alcohol or use illicit drugs.  family history includes Alzheimer's disease in her father; Colitis in her mother; Colon cancer in her father; Coronary artery disease in her father and mother; Crohn's disease in her mother; Dementia in her father; Hypertension in her mother; Osteoporosis in her mother; Stroke in her cousin. There is no history of Esophageal cancer, Rectal cancer, or Stomach cancer.  Allergies  Allergen Reactions  . Aspirin     REACTION: aggrivates colitis  . Crestor [Rosuvastatin]     REACTION: increased lfts  . Erythromycin     REACTION: GI upset  . Nsaids     REACTION: aggrivate colitis  . Oxycodone Anxiety       PHYSICAL EXAMINATION: Vital signs: BP 122/98 mmHg  Pulse 82  Ht 5\' 6"  (1.676 m)  Wt 242 lb 6.4 oz (109.952 kg)  BMI 39.14 kg/m2  LMP 08/23/2006 (Approximate)  Constitutional: Pleasant, Markedly obese and unhealthy appearing, no acute distress Psychiatric: alert and oriented x3, cooperative Eyes: extraocular movements intact, anicteric, conjunctiva pink Mouth: oral  pharynx moist, no lesions Neck: Thick and somewhat supple. No thyromegaly Lymph: no lymphadenopathy Cardiovascular: heart regular rate and rhythm, no murmur Lungs: clear to auscultation bilaterally. Distant breast sounds Abdomen: soft, markedly obese, nontender, nondistended, no obvious ascites, no peritoneal signs, normal bowel sounds, no organomegaly. Bruising from injections Rectal: Recent rectal exam unremarkable. Not repeated Extremities: no clubbing cyanosis or lower extremity edema bilaterally Skin: no relevant lesions on visible extremities Neuro: No focal deficits. Cranial nerves intact. No asterixis.    ASSESSMENT:  #1. Long-standing ulcerative colitis #2. Multiple adenomas on recent colonoscopy. This on the background of ulcerative colitis raises concerns for potential colon cancer. #3. Family history of colon cancer in her father #4. Multiple significant medical  problems including morbid obesity, heart failure, coronary artery disease, diabetes, and breast cancer   PLAN:  #1. We have very long discussion today regarding the high risk for the development of colon cancer in a patient with multiple adenomas on the background of chronic ulcerative colitis for many years with family history of colon cancer. We discussed the role of total abdominal colectomy with ileostomy as well as total abdominal colectomy with ileoanal pull-through. I discussed with her and her husband her extremely high risk for these extensive surgical procedures given her comorbidities and particularly her body habitus. They understand these limitations. As such, she is going to continue on her mesalamine. We are going to plan on repeat colonoscopy with extensive preparation in one year. At that time, polypectomies as indicated as well as surveillance biopsies. I have asked her to lose weight. They requested a goal. I would like her to get down to approximately 180 pounds. This would be 60 pounds over the next 12  months or 5 pounds per month. They understand. They will invoke programs. We will go from there.  40 minutes spent today with patient and her husband. Greater than 50% a time use for counseling regarding the serious nature of her multiple adenomas on the background of chronic inflammatory bowel disease and restrictions imposed by her body habitus and comorbidities. Multiple questions answered

## 2015-04-16 LAB — HM PAP SMEAR: HM Pap smear: NEGATIVE

## 2015-04-23 ENCOUNTER — Other Ambulatory Visit: Payer: Self-pay | Admitting: Endocrinology

## 2015-04-24 DIAGNOSIS — F3176 Bipolar disorder, in full remission, most recent episode depressed: Secondary | ICD-10-CM | POA: Diagnosis not present

## 2015-04-24 DIAGNOSIS — F3174 Bipolar disorder, in full remission, most recent episode manic: Secondary | ICD-10-CM | POA: Diagnosis not present

## 2015-04-25 ENCOUNTER — Other Ambulatory Visit (INDEPENDENT_AMBULATORY_CARE_PROVIDER_SITE_OTHER): Payer: BLUE CROSS/BLUE SHIELD

## 2015-04-25 DIAGNOSIS — E890 Postprocedural hypothyroidism: Secondary | ICD-10-CM

## 2015-04-25 DIAGNOSIS — E1165 Type 2 diabetes mellitus with hyperglycemia: Secondary | ICD-10-CM | POA: Diagnosis not present

## 2015-04-25 LAB — BASIC METABOLIC PANEL
BUN: 13 mg/dL (ref 6–23)
CO2: 28 mEq/L (ref 19–32)
Calcium: 10.8 mg/dL — ABNORMAL HIGH (ref 8.4–10.5)
Chloride: 97 mEq/L (ref 96–112)
Creatinine, Ser: 0.85 mg/dL (ref 0.40–1.20)
GFR: 73.33 mL/min (ref >60.00)
Glucose, Bld: 237 mg/dL — ABNORMAL HIGH (ref 70–99)
Potassium: 4.5 mEq/L (ref 3.5–5.1)
Sodium: 138 mEq/L (ref 135–145)

## 2015-04-25 LAB — HEMOGLOBIN A1C: Hgb A1c MFr Bld: 8.6 % — ABNORMAL HIGH (ref 4.6–6.5)

## 2015-04-25 LAB — T4, FREE: Free T4: 1.11 ng/dL (ref 0.60–1.60)

## 2015-04-25 LAB — TSH: TSH: 3.7 u[IU]/mL (ref 0.35–4.50)

## 2015-04-26 ENCOUNTER — Other Ambulatory Visit: Payer: BLUE CROSS/BLUE SHIELD

## 2015-04-26 ENCOUNTER — Encounter: Payer: Self-pay | Admitting: Internal Medicine

## 2015-04-27 ENCOUNTER — Encounter: Payer: Self-pay | Admitting: Oncology

## 2015-04-27 ENCOUNTER — Encounter: Payer: Self-pay | Admitting: Endocrinology

## 2015-04-27 ENCOUNTER — Other Ambulatory Visit: Payer: Self-pay | Admitting: *Deleted

## 2015-04-27 ENCOUNTER — Ambulatory Visit (INDEPENDENT_AMBULATORY_CARE_PROVIDER_SITE_OTHER): Payer: BLUE CROSS/BLUE SHIELD | Admitting: Endocrinology

## 2015-04-27 VITALS — BP 128/80 | HR 99 | Temp 98.1°F | Resp 14 | Ht 66.0 in | Wt 241.6 lb

## 2015-04-27 DIAGNOSIS — E890 Postprocedural hypothyroidism: Secondary | ICD-10-CM

## 2015-04-27 DIAGNOSIS — E1165 Type 2 diabetes mellitus with hyperglycemia: Secondary | ICD-10-CM | POA: Diagnosis not present

## 2015-04-27 DIAGNOSIS — C50912 Malignant neoplasm of unspecified site of left female breast: Secondary | ICD-10-CM

## 2015-04-27 MED ORDER — VICTOZA 18 MG/3ML ~~LOC~~ SOPN: PEN_INJECTOR | SUBCUTANEOUS | Status: DC

## 2015-04-27 MED ORDER — CANAGLIFLOZIN 100 MG PO TABS: ORAL_TABLET | ORAL | Status: DC

## 2015-04-27 NOTE — Patient Instructions (Addendum)
Victoza 1.8 mg   Invokana in am  Start exercise  Watch diet better!  Check blood sugars on waking up 2-3  times a week Also check blood sugars about 2 hours after a meal and do this after different meals by rotation  Recommended blood sugar levels on waking up is 90-130 and about 2 hours after meal is 130-160  Please bring your blood sugar monitor to each visit, thank you  Stop Calcium

## 2015-04-27 NOTE — Progress Notes (Signed)
Patient ID: Lindsay Fry, female   DOB: [birth year], 55 y.o.   MRN: [id number]   Reason for Appointment:  Hypothyroidism and diabetes, followup visit  History of Present Illness:  DIABETES: With her high A1c of 6.7 in 03/2014 she had an abnormal glucose tolerance test indicating diabetes and 1 hour glucose of 300 She was started on metformin; however even with 1500 mg of metformin ER her blood sugars were not controlled especially fasting Subsequently with taking Janumet XR 100/1000 daily her blood sugars were much better  Because of her weight gain and A1c going up to 7.3 along with occasional readings over 200 she was started on Victoza in 11/16  Non-insulin hypoglycemic drugs: Victoza 1.2 mg daily, metformin ER 1500 mg daily  Current management, blood sugar patterns and problems identified:  She has had progressive hyperglycemia recently and A1c is up to 8.6%  She thinks most of her high readings are related to having steroids last month on 2 occasions for her bronchitis including an 8 day course on 03/20/15  Overall she has been noncompliant with diet or exercise regimen and not able to control types of food or portions, does not watch her carbohydrates.  This is despite taking 1.2 Victoza  Her weight has gone up recently  FASTING glucose in the lab was 257 even though she thinks it is usually 175-200 at home, did not bring her monitor for review today  Blood sugars appear to be still high even though she has not had any steroids for about 3 weeks now  Currently not exercising and has not been motivated to do so  Weight history:  Wt Readings from Last 3 Encounters:  04/27/15 241 lb 9.6 oz (109.589 kg)  04/14/15 242 lb 6.4 oz (109.952 kg)  03/20/15 230 lb (104.327 kg)   Previous consultation with dietitian: None Exercise regimen: None recently  LABS:  Lab Results  Component Value Date   HGBA1C 8.6* 04/25/2015   HGBA1C 7.3* 11/28/2014   HGBA1C 5.4  06/07/2014   Lab Results  Component Value Date   MICROALBUR 17.3* 11/28/2014   LDLCALC 65 10/13/2014   CREATININE 0.85 04/25/2015     HYPOTHYROIDISM: This was first diagnosed in 1992 after her treatment for Graves' disease with I-131 She has been on relatively large doses of thyroxine supplements She was on 224 mcg since 11/13 and her TSH was normal in 3/14 Subjectively difficult to assess her thyroid since she tends to have fatigue chronically.  In 2014 her dose was reduced to 200 mcg daily and her dose has been fluctuating since then She has generally required periodic increase in dosage including in 05/2014 when her TSH was 20  She is now on 2 tablets of 137 g daily, continues on the brand name Synthroid  Her symptoms are usually nonspecific for hypothyroidism, mostly related to other medical problems including sleep disturbances and bipolar illness.   She has been quite regular with the Synthroid in the mornings without any interfering substances         TSH is normal consistently recently:   Lab Results  Component Value Date   TSH 3.70 04/25/2015   TSH 4.13 01/25/2015   TSH 3.09 07/27/2014   FREET4 1.11 04/25/2015   FREET4 1.24 01/25/2015   FREET4 1.18 07/27/2014     Lab on 04/25/2015  Component Date Value Ref Range Status  . Sodium 04/25/2015 138  135 - 145 mEq/L Final  . Potassium 04/25/2015 4.5  3.5 -  5.1 mEq/L Final  . Chloride 04/25/2015 97  96 - 112 mEq/L Final  . CO2 04/25/2015 28  19 - 32 mEq/L Final  . Glucose, Bld 04/25/2015 237* 70 - 99 mg/dL Final  . BUN 04/25/2015 13  6 - 23 mg/dL Final  . Creatinine, Ser 04/25/2015 0.85  0.40 - 1.20 mg/dL Final  . Calcium 04/25/2015 10.8* 8.4 - 10.5 mg/dL Final  . GFR 04/25/2015 73.33  >60.00 mL/min Final  . Hgb A1c MFr Bld 04/25/2015 8.6* 4.6 - 6.5 % Final   Glycemic Control Guidelines for People with Diabetes:Non Diabetic:  <6%Goal of Therapy: <7%Additional Action Suggested:  >8%   . TSH 04/25/2015 3.70  0.35 -  4.50 uIU/mL Final  . Free T4 04/25/2015 1.11  0.60 - 1.60 ng/dL Final      Medication List       This list is accurate as of: 04/27/15  4:55 PM.  Always use your most recent med list.               aspirin 81 MG tablet  Take 81 mg by mouth daily.     atorvastatin 80 MG tablet  Commonly known as:  LIPITOR  TAKE 1 TABLET(80 MG) BY MOUTH DAILY     b complex vitamins capsule  Take 1 capsule by mouth daily.     buPROPion 150 MG 24 hr tablet  Commonly known as:  WELLBUTRIN XL  Take 450 mg by mouth daily.     Calcium 600-200 MG-UNIT tablet  Take 1 tablet by mouth 2 (two) times daily.     canagliflozin 100 MG Tabs tablet  Commonly known as:  INVOKANA  1 tablet before breakfast     chlorproMAZINE 25 MG tablet  Commonly known as:  THORAZINE  Take 25-50 mg by mouth as needed. Reported on 03/02/2015     cloNIDine 0.1 MG tablet  Commonly known as:  CATAPRES  TAKE 1 TABLET BY MOUTH TWICE DAILY     co-enzyme Q-10 50 MG capsule  Take 50 mg by mouth daily.     cyclobenzaprine 10 MG tablet  Commonly known as:  FLEXERIL  TAKE 2 TABLETS BY MOUTH EVERY NIGHT AT BEDTIME     diazepam 10 MG tablet  Commonly known as:  VALIUM  Take 2.5-10 mg by mouth as needed for anxiety.     diltiazem 240 MG 24 hr capsule  Commonly known as:  CARDIZEM CD  Take 1 capsule (240 mg total) by mouth daily.     diphenhydrAMINE 50 MG tablet  Commonly known as:  BENADRYL  Take 100 mg by mouth at bedtime as needed (anxiety).     fluticasone 50 MCG/ACT nasal spray  Commonly known as:  FLONASE  USE 2 SPRAYS IN EACH NOSTRIL TWICE DAILY     gabapentin 300 MG capsule  Commonly known as:  NEURONTIN  TAKE 1 CAPSULE(300 MG) BY MOUTH THREE TIMES DAILY     GAS-X PO  Take 1 tablet by mouth 4 (four) times daily.     glucose blood test strip  Commonly known as:  ONETOUCH VERIO  Use as instructed to check blood sugar twice a day     Insulin Pen Needle 31G X 5 MM Misc  Use once a day with Victoza pen       lamoTRIgine 200 MG tablet  Commonly known as:  LAMICTAL  Take 400 mg by mouth daily.     latanoprost 0.005 % ophthalmic solution  Commonly known as:  XALATAN  Place 1 drop into both eyes At bedtime.     letrozole 2.5 MG tablet  Commonly known as:  FEMARA  Take 1 tablet (2.5 mg total) by mouth daily.     Linaclotide 290 MCG Caps capsule  Commonly known as:  LINZESS  Take 1 capsule (290 mcg total) by mouth daily.     mesalamine 1.2 g EC tablet  Commonly known as:  LIALDA  Take 1 tablet (1.2 g total) by mouth daily with breakfast.     metFORMIN 500 MG 24 hr tablet  Commonly known as:  GLUCOPHAGE-XR  Take 3 tablets (1,500 mg total) by mouth daily with supper.     multivitamin tablet  Take 1 tablet by mouth daily.     nebivolol 10 MG tablet  Commonly known as:  BYSTOLIC  Take 1 tablet (10 mg total) by mouth 2 (two) times daily.     nitrofurantoin 100 MG capsule  Commonly known as:  MACRODANTIN  Take 100 mg by mouth as directed. Reported on 03/02/2015     Omega-3 Krill Oil 500 MG Caps  Take 500 mg by mouth.     pantoprazole 40 MG tablet  Commonly known as:  PROTONIX  TAKE 1 TABLET BY MOUTH TWICE DAILY     PROBIOTIC DAILY PO  Take by mouth.     promethazine 25 MG tablet  Commonly known as:  PHENERGAN  Take 1 tablet (25 mg total) by mouth every 6 (six) hours as needed for nausea or vomiting.     SALONPAS PAIN RELIEF PATCH EX  Apply topically as needed. Reported on 03/02/2015     SAPHRIS 10 MG Subl  Generic drug:  Asenapine Maleate  Place 20 mg under the tongue at bedtime.     senna-docusate 8.6-50 MG tablet  Commonly known as:  Senokot-S  Take 3 tablets by mouth daily.     sertraline 100 MG tablet  Commonly known as:  ZOLOFT  Take 100 mg by mouth daily.     spironolactone 25 MG tablet  Commonly known as:  ALDACTONE  Take 25 mg by mouth 2 (two) times daily.     SYNTHROID 137 MCG tablet  Generic drug:  levothyroxine  TAKE 2 TABLETS BY MOUTH DAILY BEFORE  BREAKFAST.     ticagrelor 90 MG Tabs tablet  Commonly known as:  BRILINTA  Take 1 tablet (90 mg total) by mouth 2 (two) times daily.     tiZANidine 4 MG tablet  Commonly known as:  ZANAFLEX  Take 4 mg by mouth. Reported on 03/02/2015     valACYclovir 500 MG tablet  Commonly known as:  VALTREX  Take 500 mg by mouth 2 (two) times daily.     VICTOZA 18 MG/3ML Sopn  Generic drug:  Liraglutide  ADMINISTER 1.8 MG UNDER THE SKIN DAILY AT THE SAME TIME EVERY DAY     vitamin C 500 MG tablet  Commonly known as:  ASCORBIC ACID  Take 250 mg by mouth daily. Takes 2 a day     Vitamin D3 2000 units Tabs  Take 1 tablet by mouth daily.     zolpidem 10 MG tablet  Commonly known as:  AMBIEN  Take 10 mg by mouth at bedtime.        Allergies:  Allergies  Allergen Reactions  . Aspirin     REACTION: aggrivates colitis  . Crestor [Rosuvastatin]     REACTION: increased lfts  . Erythromycin     REACTION: GI  upset  . Nsaids     REACTION: aggrivate colitis  . Oxycodone Anxiety    Past Medical History  Diagnosis Date  . Rosacea   . Family history of malignant neoplasm of gastrointestinal tract   . Allergic rhinitis, cause unspecified   . Anxiety state, unspecified   . Unspecified asthma(493.90)   . Benign neoplasm of other and unspecified site of the digestive system   . Ulcerative colitis, unspecified   . Depression   . Fibromyalgia   . GERD (gastroesophageal reflux disease)   . Hiatal hernia   . Pure hypercholesterolemia   . Unspecified essential hypertension   . Unspecified hypothyroidism   . Insomnia, unspecified   . History of migraines   . Other screening mammogram   . Asymptomatic postmenopausal status (age-related) (natural)   . Unspecified vitamin D deficiency   . Edema   . Dysuria   . History of ovarian cyst   . Cyst     on Achilles tendon  . Tubular adenoma of colon 01/10/11  . Vertigo   . Subacute confusional state 11/19/2012  . ADD (attention deficit disorder)  11/19/2012  . OCD (obsessive compulsive disorder)   . Glaucoma   . History of alcoholism ([place])   . Bipolar disorder ([place])   . Diabetes mellitus ([place])     frank  . Atypical hyperplasia of left breast 2000  . Headache     migraines  . Arthritis   . Breast cancer ([place])     Left Breast 51mm invasive CA left uiq, dcis left uoq and a lot ADH, ALH in both breasts.  . Carcinoma of left breast ([place]) 06/02/2014  . CAD (coronary artery disease)     a. 08/2014 NSTEMI/Cath: LM nl, LAD 50p, D1/D2 min irregs, LCX nl, OM1 40, LPDA nl, RI 99ost (2.43mm vessel->med Rx), RCA nl, AM 60, nl EF.  Marland Kitchen Chronic diastolic CHF (congestive heart failure) ([place])     a. 08/2014 Echo: EF 60-65%, Gr 1 DD, nl LA.  Marland Kitchen Myocardial infarction [facility]) 09-04-14    Past Surgical History  Procedure Laterality Date  . Wisdom tooth extraction  1980's  . Tonsillectomy  1984  . Cesarean section  1996/1997    placenta previa, gest DM, pre-eclampsia  . Nasal sinus surgery  05/1997  . Precancerous mole removed    . Cholecystectomy  1997    adhesions also  . Meckel diverticulum excision  1999  . Dexa  04/1999 and 2010    normal  . Colonoscopy  01/2001    Ulcerative colitis  . Esophagogastroduodenoscopy  09/2001    polyp  . Exercise stress test  11/2003    negative  . Nuclear stress test  06/2007    negative  . Colonoscopy  04/2004    UC, polyp  . Colonoscopy  12/08    UC, no polyps  . Sleep study  11/09    no apnea, but did snore (done by [facility])  . Bunionectomy Right   . Femur fracture surgery Left   . Appendectomy    . Tubal ligation    . Simple mastectomy with axillary sentinel node biopsy Bilateral 05/23/2014    Procedure: Bilateral simple mastectomy, left sentinel node biopsy ;  Surgeon: Christene Lye, MD;  Location: [facility] ORS;  Service: General;  Laterality: Bilateral;  . Axillary sentinel node biopsy Left 05/23/2014    Procedure: AXILLARY SENTINEL NODE BIOPSY;  Surgeon: Christene Lye, MD;  Location: [facility]  ORS;  Service: General;  Laterality: Left;  Marland Kitchen Mastectomy Bilateral 05-23-14    Dr. Jamal Collin  . Breast biopsy  Aug 2000    on tamoxifen, atypical hyperplasia  . Breast surgery Left Aug 2000    lumpectomy/ Dr Sharlet Salina  . Breast surgery Bilateral 05/23/14    Mastectomy  . Cardiac catheterization N/A 09/05/2014    Procedure: Left Heart Cath and Coronary Angiography;  Surgeon: Wellington Hampshire, MD;  Location: [facility];  Service: Cardiovascular;  Laterality: N/A;  . Cardiac catheterization  09-05-14  . Cholecystect  1999    Family History  Problem Relation Age of Onset  . Coronary artery disease Father   . Colon cancer Father   . Alzheimer's disease Father   . Dementia Father   . Coronary artery disease Mother   . Hypertension Mother   . Osteoporosis Mother   . Colitis Mother   . Crohn's disease Mother   . Breast cancer      great aunts  . Coronary artery disease      Uncle (also AAA)  . Diabetes      remote family history  . Stroke Cousin   . Esophageal cancer Neg Hx   . Rectal cancer Neg Hx   . Stomach cancer Neg Hx     Social History:  reports that she quit smoking about 22 years ago. Her smoking use included Cigarettes. She has a 1.25 pack-year smoking history. She has never used smokeless tobacco. She reports that she does not drink alcohol or use illicit drugs.  REVIEW Of SYSTEMS:   She is taking Aldactone 25 mg for acne  HYPERTENSION: Blood pressure is recently high but today is normal, managed by cardiologist.   Currently on low-dose clonidine, Bystolic twice a day and diltiazem  History of hypercholesterolemia: Previously on 20 mg Lipitor now 80 mg since her MI  Lab Results  Component Value Date   CHOL 154 11/28/2014   HDL 39.40 11/28/2014   LDLCALC 65 10/13/2014   LDLDIRECT 94.0 11/28/2014   TRIG 235.0* 11/28/2014   CHOLHDL 4 11/28/2014   Lab Results  Component Value Date   ALT 72* 02/07/2015      Examination:   BP 128/80 mmHg  Pulse 99   Temp(Src) 98.1 F (36.7 C) (Oral)  Resp 14  Ht 5\' 6"  (1.676 m)  Wt 241 lb 9.6 oz (109.589 kg)  BMI 39.01 kg/m2  SpO2 94%  LMP 08/23/2006 (Approximate)   She looks cushingoid    Assessments   DIABETES:  See history of present illness for detailed discussion of his current management, blood sugar patterns and problems identified  Her blood sugars are poorly controlled with A1c going up to 8.7 and recent fasting glucose was over 200 even though she has not had any steroids for 3 weeks Currently on Victoza and metformin Has difficulty complying with diet and exercise and has gained weight This is worsened with her use of steroids  Discussed with the patient that to get better blood sugar control and also to facilitate weight loss we have to progressively increase her medications She is a good candidate for drug like Invokana Discussed action of SGLT 2 drugs on lowering glucose by decreasing kidney absorption of glucose, benefits of weight loss and lower blood pressure, possible side effects including candidiasis and dosage regimen  She will start with 100 mg daily  She'll continue metformin, possibly combined this with Invokana later Increase Victoza to 1.8 mg Have commended checking blood sugars more consistently and  bring monitor on each visit Strongly recommended that she start a walking and exercise program Needs better diet, consider consultation with dietitian again  Hypothyroidism, post ablative:  She has had long-standing hypothyroidism and has normal levels of TSH fairly consistently now She will continue her dose of 137 g, 2 tablets daily  Patient Instructions  Victoza 1.8 mg   Invokana in am  Start exercise  Watch diet better!  Check blood sugars on waking up 2-3  times a week Also check blood sugars about 2 hours after a meal and do this after different meals by rotation  Recommended blood sugar levels on waking up is 90-130 and about 2 hours after meal is  130-160  Please bring your blood sugar monitor to each visit, thank you  Stop Calcium     Lynette Topete 04/27/2015, 4:55 PM   Note: This office note was prepared with Dragon voice recognition system technology. Any transcriptional errors that result from this process are unintentional.

## 2015-05-01 ENCOUNTER — Other Ambulatory Visit: Payer: Self-pay

## 2015-05-01 MED ORDER — RANITIDINE HCL 300 MG PO TABS
300.0000 mg | ORAL_TABLET | Freq: Every day | ORAL | Status: DC
Start: 2015-05-01 — End: 2015-07-26

## 2015-05-02 MED ORDER — LETROZOLE 2.5 MG PO TABS: 2.5000 mg | ORAL_TABLET | Freq: Every day | ORAL | Status: DC

## 2015-05-04 DIAGNOSIS — G43019 Migraine without aura, intractable, without status migrainosus: Secondary | ICD-10-CM | POA: Diagnosis not present

## 2015-05-04 DIAGNOSIS — R51 Headache: Secondary | ICD-10-CM | POA: Diagnosis not present

## 2015-05-05 ENCOUNTER — Encounter: Payer: Self-pay | Admitting: Endocrinology

## 2015-05-07 ENCOUNTER — Encounter: Payer: Self-pay | Admitting: Endocrinology

## 2015-05-08 ENCOUNTER — Telehealth: Payer: Self-pay | Admitting: *Deleted

## 2015-05-08 ENCOUNTER — Encounter: Payer: Self-pay | Admitting: Endocrinology

## 2015-05-08 NOTE — Telephone Encounter (Signed)
Hi Dr Jessie Foot!   This is just FYI:   Since beginning the Invokana, I am nauseated n the morning & sometimes don't get appetite till supper time.   Today I ate a scoop of potato salad & a very small lettuce & tomato salad, then got really nauseated & vomited.  Am ok now that I vomited. Reminds me of morning sickness.   However my blood sugars have been within normal limits & I am able to feel full more quickly than before taking it.   So I really want to keep taking it, but don't know what to do about nausea other than ginger ale, saltines & Dramamine. I have promethazine if needed but as nausea & vomiting not severe I have only taken Dramamine 50 mg in the morning.

## 2015-05-08 NOTE — Telephone Encounter (Signed)
Patient was told to try taking half tablet at lunch with email message

## 2015-05-08 NOTE — Telephone Encounter (Signed)
noted 

## 2015-05-09 ENCOUNTER — Encounter: Payer: Self-pay | Admitting: Internal Medicine

## 2015-05-11 ENCOUNTER — Ambulatory Visit: Payer: BLUE CROSS/BLUE SHIELD | Admitting: Cardiovascular Disease

## 2015-05-12 ENCOUNTER — Encounter: Payer: Self-pay | Admitting: Cardiovascular Disease

## 2015-05-12 ENCOUNTER — Ambulatory Visit (INDEPENDENT_AMBULATORY_CARE_PROVIDER_SITE_OTHER): Payer: BLUE CROSS/BLUE SHIELD | Admitting: Cardiovascular Disease

## 2015-05-12 VITALS — BP 138/88 | HR 98 | Ht 66.5 in | Wt 239.8 lb

## 2015-05-12 DIAGNOSIS — I214 Non-ST elevation (NSTEMI) myocardial infarction: Secondary | ICD-10-CM | POA: Diagnosis not present

## 2015-05-12 DIAGNOSIS — I10 Essential (primary) hypertension: Secondary | ICD-10-CM | POA: Diagnosis not present

## 2015-05-12 DIAGNOSIS — R000 Tachycardia, unspecified: Secondary | ICD-10-CM

## 2015-05-12 DIAGNOSIS — E1159 Type 2 diabetes mellitus with other circulatory complications: Secondary | ICD-10-CM

## 2015-05-12 DIAGNOSIS — I2510 Atherosclerotic heart disease of native coronary artery without angina pectoris: Secondary | ICD-10-CM | POA: Diagnosis not present

## 2015-05-12 DIAGNOSIS — E7800 Pure hypercholesterolemia, unspecified: Secondary | ICD-10-CM

## 2015-05-12 NOTE — Patient Instructions (Signed)
You are doing well. No medication changes were made.  Ok to take extra bystolic in the AM if you need it  Please call us if you have new issues that need to be addressed before your next appt.  Your physician wants you to follow-up in: 6 months.  You will receive a reminder letter in the mail two months in advance. If you don't receive a letter, please call our office to schedule the follow-up appointment.

## 2015-05-14 NOTE — Assessment & Plan Note (Signed)
Denies having any significant chest pain concerning for angina, no further testing at this time

## 2015-05-14 NOTE — Assessment & Plan Note (Signed)
Currently with no symptoms of angina. No further workup at this time. Continue current medication regimen. 

## 2015-05-14 NOTE — Progress Notes (Signed)
Patient ID: Lindsay Fry, female    DOB: 04-Dec-1960, 55 y.o.   MRN: [id number]  HPI Comments: Lindsay Fry is a very pleasant 55 year old woman with history of  Morbid obesity, coronary artery disease,  Severe ostial ramus disease treated medically by catheterization August 2016,hypertension, hyperlipidemia,  previous symptoms of chest pain and shortness of breath.  Also with a history of migraines She presents today for follow-up of her hypertension, hyperlipidemia,  Coronary artery disease  history of  Chronic tachycardia  In follow-up today, she reports that she is doing well Denies any symptoms from tachycardia. Tolerating bystolic 10 mg twice a day Reports having difficulty losing weight, no regular exercise program, She has been eating poorly, hemoglobin A1c initially 5.4, then 7.3, most recently 8.6   and of 2016 total cholesterol 154, LDL 94   She does have mild chronic shortness of breath on exertion, general malaise   EKG on today's visit shows sinus tachycardia with rate 98 bpm, left anterior fascicular block  Other past medical history Previously had left arm pain coming on at rest. This seemed to resolve with NSAIDs. Also with several episodes of higher blood pressure, possibly associated with stress. He states that she has been taking clonidine once a day in the evening, amlodipine in the morning  Vertigo symptoms occurred in early 2014 and recurrent August 9. She is taking meclizine. She has dizziness and feels that she is drunk when she stands up.  History of hyperlipidemia. In June 2010, total cholesterol 314.  Remote smoking for a short period of time, none recently.   Allergies  Allergen Reactions  . Aspirin     REACTION: aggrivates colitis  . Crestor [Rosuvastatin]     REACTION: increased lfts  . Erythromycin     REACTION: GI upset  . Nsaids     REACTION: aggrivate colitis  . Oxycodone Anxiety    Outpatient Encounter Prescriptions as of 05/12/2015   Medication Sig  . Asenapine Maleate (SAPHRIS) 10 MG SUBL Place 20 mg under the tongue at bedtime.  Marland Kitchen aspirin 81 MG tablet Take 81 mg by mouth daily.  Marland Kitchen atorvastatin (LIPITOR) 80 MG tablet TAKE 1 TABLET(80 MG) BY MOUTH DAILY  . b complex vitamins capsule Take 1 capsule by mouth daily.  Marland Kitchen buPROPion (WELLBUTRIN XL) 150 MG 24 hr tablet Take 450 mg by mouth daily.   . Calcium 600-200 MG-UNIT per tablet Take 1 tablet by mouth 2 (two) times daily.  . canagliflozin (INVOKANA) 100 MG TABS tablet 1 tablet before breakfast  . chlorproMAZINE (THORAZINE) 25 MG tablet Take 25-50 mg by mouth as needed. Reported on 03/02/2015  . Cholecalciferol (VITAMIN D3) 2000 UNITS TABS Take 1 tablet by mouth daily.  . cloNIDine (CATAPRES) 0.1 MG tablet TAKE 1 TABLET BY MOUTH TWICE DAILY (Patient taking differently: TAKE 1/2 TABLET BY MOUTH TWICE DAILY)  . co-enzyme Q-10 50 MG capsule Take 50 mg by mouth daily.  . cyclobenzaprine (FLEXERIL) 10 MG tablet TAKE 2 TABLETS BY MOUTH EVERY NIGHT AT BEDTIME  . diazepam (VALIUM) 10 MG tablet Take 2.5-10 mg by mouth as needed for anxiety.  Marland Kitchen diltiazem (CARDIZEM CD) 240 MG 24 hr capsule Take 1 capsule (240 mg total) by mouth daily.  . DimenhyDRINATE (DRAMAMINE PO) Take by mouth as needed.  . diphenhydrAMINE (BENADRYL) 50 MG tablet Take 100 mg by mouth at bedtime as needed (anxiety).   . fluticasone (FLONASE) 50 MCG/ACT nasal spray USE 2 SPRAYS IN EACH NOSTRIL TWICE DAILY  .  gabapentin (NEURONTIN) 300 MG capsule TAKE 1 CAPSULE(300 MG) BY MOUTH THREE TIMES DAILY  . glucose blood (ONETOUCH VERIO) test strip Use as instructed to check blood sugar twice a day  . Insulin Pen Needle 31G X 5 MM MISC Use once a day with Victoza pen  . lamoTRIgine (LAMICTAL) 200 MG tablet Take 400 mg by mouth daily.  Marland Kitchen latanoprost (XALATAN) 0.005 % ophthalmic solution Place 1 drop into both eyes At bedtime.  Marland Kitchen letrozole (FEMARA) 2.5 MG tablet Take 1 tablet (2.5 mg total) by mouth daily.  . Linaclotide  (LINZESS) 290 MCG CAPS capsule Take 1 capsule (290 mcg total) by mouth daily.  . Liniments (SALONPAS PAIN RELIEF PATCH EX) Apply topically as needed. Reported on 03/02/2015  . mesalamine (LIALDA) 1.2 g EC tablet Take 1 tablet (1.2 g total) by mouth daily with breakfast.  . metFORMIN (GLUCOPHAGE-XR) 500 MG 24 hr tablet Take 3 tablets (1,500 mg total) by mouth daily with supper.  . Multiple Vitamin (MULTIVITAMIN) tablet Take 1 tablet by mouth daily.    . nebivolol (BYSTOLIC) 10 MG tablet Take 1 tablet (10 mg total) by mouth 2 (two) times daily.  . nitrofurantoin (MACRODANTIN) 100 MG capsule Take 100 mg by mouth as directed. Reported on 03/02/2015  . Omega-3 Krill Oil 500 MG CAPS Take 500 mg by mouth.  . pantoprazole (PROTONIX) 40 MG tablet TAKE 1 TABLET BY MOUTH TWICE DAILY  . Probiotic Product (PROBIOTIC DAILY PO) Take by mouth.  . promethazine (PHENERGAN) 25 MG tablet Take 1 tablet (25 mg total) by mouth every 6 (six) hours as needed for nausea or vomiting.  . ranitidine (ZANTAC) 300 MG tablet Take 1 tablet (300 mg total) by mouth at bedtime.  . senna-docusate (SENOKOT-S) 8.6-50 MG tablet Take 3 tablets by mouth daily.  . sertraline (ZOLOFT) 100 MG tablet Take 100 mg by mouth daily.  . Simethicone (GAS-X PO) Take 1 tablet by mouth 4 (four) times daily.  Marland Kitchen spironolactone (ALDACTONE) 25 MG tablet Take 25 mg by mouth 2 (two) times daily.    Marland Kitchen SYNTHROID 137 MCG tablet TAKE 2 TABLETS BY MOUTH DAILY BEFORE BREAKFAST.  Marland Kitchen ticagrelor (BRILINTA) 90 MG TABS tablet Take 1 tablet (90 mg total) by mouth 2 (two) times daily.  Marland Kitchen tiZANidine (ZANAFLEX) 4 MG tablet Take 4 mg by mouth. Reported on 03/02/2015  . valACYclovir (VALTREX) 500 MG tablet Take 500 mg by mouth 2 (two) times daily.    Marland Kitchen VICTOZA 18 MG/3ML SOPN ADMINISTER 1.8 MG UNDER THE SKIN DAILY AT THE SAME TIME EVERY DAY  . vitamin C (ASCORBIC ACID) 500 MG tablet Take 250 mg by mouth daily. Takes 2 a day  . zolpidem (AMBIEN) 10 MG tablet Take 10 mg by mouth  at bedtime.   No facility-administered encounter medications on file as of 05/12/2015.    Past Medical History  Diagnosis Date  . Rosacea   . Family history of malignant neoplasm of gastrointestinal tract   . Allergic rhinitis, cause unspecified   . Anxiety state, unspecified   . Unspecified asthma(493.90)   . Benign neoplasm of other and unspecified site of the digestive system   . Ulcerative colitis, unspecified   . Depression   . Fibromyalgia   . GERD (gastroesophageal reflux disease)   . Hiatal hernia   . Pure hypercholesterolemia   . Unspecified essential hypertension   . Unspecified hypothyroidism   . Insomnia, unspecified   . History of migraines   . Other screening mammogram   .  Asymptomatic postmenopausal status (age-related) (natural)   . Unspecified vitamin D deficiency   . Edema   . Dysuria   . History of ovarian cyst   . Cyst     on Achilles tendon  . Tubular adenoma of colon 01/10/11  . Vertigo   . Subacute confusional state 11/19/2012  . ADD (attention deficit disorder) 11/19/2012  . OCD (obsessive compulsive disorder)   . Glaucoma   . History of alcoholism ([place])   . Bipolar disorder ([place])   . Diabetes mellitus ([place])     frank  . Atypical hyperplasia of left breast 2000  . Headache     migraines  . Arthritis   . Breast cancer ([place])     Left Breast 7m invasive CA left uiq, dcis left uoq and a lot ADH, ALH in both breasts.  . [place] 06/02[place]  . CAD (coronary artery disease)     a. 08/2014 NSTEMI/Cath: LM nl, LAD 50p, D1/D2 min irregs, LCX nl, OM1 40, LPDA nl, RI 99ost (2.087mvessel->med Rx), RCA nl, AM 60, nl EF.  . Marland Kitchenhronic diastolic CHF (congestive heart failure) ([place]    a. 08/2014 Echo: EF 60-65%, Gr 1 DD, nl LA.  . [facility]    Past Surgical History  Procedure Laterality Date  . Wisdom tooth extraction  1980's  . Tonsillectomy  1984  . Cesarean section  1996/1997    placenta previa, gest DM,  pre-eclampsia  . Nasal sinus surgery  05/1997  . Precancerous mole removed    . Cholecystectomy  1997    adhesions also  . Meckel diverticulum excision  1999  . Dexa  04/1999 and 2010    normal  . Colonoscopy  01/2001    Ulcerative colitis  . Esophagogastroduodenoscopy  09/2001    polyp  . Exercise stress test  11/2003    negative  . Nuclear stress test  06/2007    negative  . Colonoscopy  04/2004    UC, polyp  . Colonoscopy  12/08    UC, no polyps  . Sleep study  11/09    no apnea, but did snore (done by [facility])  . Bunionectomy Right   . Femur fracture surgery Left   . Appendectomy    . Tubal ligation    . Simple mastectomy with axillary sentinel node biopsy Bilateral 05/23/2014    Procedure: Bilateral simple mastectomy, left sentinel node biopsy ;  Surgeon: SeChristene LyeMD;  Location: [facility] ORS;  Service: General;  Laterality: Bilateral;  . Axillary sentinel node biopsy Left 05/23/2014    Procedure: AXILLARY SENTINEL NODE BIOPSY;  Surgeon: SeChristene LyeMD;  Location: [facility] ORS;  Service: General;  Laterality: Left;  . Marland Kitchenastectomy Bilateral 05-23-14    Dr. SaJamal Collin. Breast biopsy  Aug 2000    on tamoxifen, atypical hyperplasia  . Breast surgery Left Aug 2000    lumpectomy/ Dr CrSharlet Salina. Breast surgery Bilateral 05/23/14    Mastectomy  . Cardiac catheterization N/A 09/05/2014    Procedure: Left Heart Cath and Coronary Angiography;  Surgeon: MuWellington HampshireMD;  [facility];  Service: Cardiovascular;  Laterality: N/A;  . Cardiac catheterization  09-05-14  . [place]  reports that she quit smoking about 22 years ago. Her smoking use included Cigarettes. She has a 1.25 pack-year smoking history. She has never used smokeless tobacco. She reports that she does  not drink alcohol or use illicit drugs.  Family History family history includes Alzheimer's disease in her father; Colitis in her mother; Colon cancer in her  father; Coronary artery disease in her father and mother; Crohn's disease in her mother; Dementia in her father; Hypertension in her mother; Osteoporosis in her mother; Stroke in her cousin. There is no history of Esophageal cancer, Rectal cancer, or Stomach cancer.    Review of Systems  Constitutional: Negative.   Respiratory: Positive for shortness of breath.   Cardiovascular: Negative.   Gastrointestinal: Negative.   Musculoskeletal: Positive for gait problem.  Neurological: Negative.   Hematological: Negative.   Psychiatric/Behavioral: Negative.   All other systems reviewed and are negative.   BP 138/88 mmHg  Pulse 98  Ht 5' 6.5" (1.689 m)  Wt 239 lb 12 oz (108.75 kg)  BMI 38.12 kg/m2  LMP 08/23/2006 (Approximate)  Physical Exam  Constitutional: She is oriented to person, place, and time. She appears well-developed and well-nourished.   Morbidly obese  HENT:  Head: Normocephalic.  Nose: Nose normal.  Mouth/Throat: Oropharynx is clear and moist.  Eyes: Conjunctivae are normal. Pupils are equal, round, and reactive to light.  Neck: Normal range of motion. Neck supple. No JVD present.  Cardiovascular: Normal rate, regular rhythm, S1 normal, S2 normal, normal heart sounds and intact distal pulses.  Exam reveals no gallop and no friction rub.   No murmur heard. Pulmonary/Chest: Effort normal and breath sounds normal. No respiratory distress. She has no wheezes. She has no rales. She exhibits no tenderness.  Abdominal: Soft. Bowel sounds are normal. She exhibits no distension. There is no tenderness.  Musculoskeletal: Normal range of motion. She exhibits no edema or tenderness.  Lymphadenopathy:    She has no cervical adenopathy.  Neurological: She is alert and oriented to person, place, and time. Coordination normal.  Skin: Skin is warm and dry. No rash noted. No erythema.  Psychiatric: She has a normal mood and affect. Her behavior is normal. Judgment and thought content  normal.    Assessment and Plan  Nursing note and vitals reviewed.

## 2015-05-14 NOTE — Assessment & Plan Note (Signed)
Cholesterol above goal on lab work 5 months ago Numbers may improve with better diabetes control If no improvement in follow-up, will add zetia in addition to her Lipitor   Total encounter time more than 25 minutes  Greater than 50% was spent in counseling and coordination of care with the patient

## 2015-05-14 NOTE — Assessment & Plan Note (Signed)
Blood pressure is well controlled on today's visit. No changes made to the medications. 

## 2015-05-14 NOTE — Assessment & Plan Note (Signed)
Long discussion with her concerning her diet. Dietary guide provided. Recommended low carbohydrate diet Recommended exercise, weight loss an effort to lower her hemoglobin A1c

## 2015-05-18 DIAGNOSIS — F3131 Bipolar disorder, current episode depressed, mild: Secondary | ICD-10-CM | POA: Diagnosis not present

## 2015-05-25 ENCOUNTER — Encounter: Payer: Self-pay | Admitting: Endocrinology

## 2015-05-25 ENCOUNTER — Ambulatory Visit (INDEPENDENT_AMBULATORY_CARE_PROVIDER_SITE_OTHER): Payer: BLUE CROSS/BLUE SHIELD | Admitting: Endocrinology

## 2015-05-25 VITALS — BP 132/82 | HR 96 | Temp 97.7°F | Resp 16 | Ht 66.0 in | Wt 241.6 lb

## 2015-05-25 DIAGNOSIS — E1165 Type 2 diabetes mellitus with hyperglycemia: Secondary | ICD-10-CM | POA: Diagnosis not present

## 2015-05-25 DIAGNOSIS — R945 Abnormal results of liver function studies: Secondary | ICD-10-CM

## 2015-05-25 DIAGNOSIS — E890 Postprocedural hypothyroidism: Secondary | ICD-10-CM | POA: Diagnosis not present

## 2015-05-25 DIAGNOSIS — K7689 Other specified diseases of liver: Secondary | ICD-10-CM

## 2015-05-25 LAB — BASIC METABOLIC PANEL
BUN: 19 mg/dL (ref 6–23)
CO2: 24 mEq/L (ref 19–32)
Calcium: 10.3 mg/dL (ref 8.4–10.5)
Chloride: 102 mEq/L (ref 96–112)
Creatinine, Ser: 0.97 mg/dL (ref 0.40–1.20)
GFR: 62.95 mL/min (ref >60.00)
Glucose, Bld: 139 mg/dL — ABNORMAL HIGH (ref 70–99)
Potassium: 4.3 mEq/L (ref 3.5–5.1)
Sodium: 138 mEq/L (ref 135–145)

## 2015-05-25 NOTE — Patient Instructions (Signed)
2 Invokana and 2 Metformin at lunch  Take Aldactone as needed

## 2015-05-25 NOTE — Progress Notes (Signed)
Patient ID: Lindsay Fry, female   DOB: December 24, 1960, 55 y.o.   MRN: [id number]   Reason for Appointment:  Hypothyroidism and diabetes, followup visit  History of Present Illness:  DIABETES: With her high A1c of 6.7 in 03/2014 she had an abnormal glucose tolerance test indicating diabetes and 1 hour glucose of 300 She was started on metformin; however even with 1500 mg of metformin ER  blood sugars were not controlled especially fasting Subsequently with taking Janumet XR 100/1000 daily her blood sugars were much better  Because of her weight gain and A1c going up to 7.3 along with occasional readings over 200 she was started on Victoza in 11/16 She has had progressive hyperglycemia recently and A1c was up to 8.6% in 4/11  Non-insulin hypoglycemic drugs: Victoza 1.2 mg daily, metformin ER 1500 mg daily, Invokana 100mg   Current management, blood sugar patterns and problems identified:  She has started taking Invokana since 4/17.  Initially had some nausea but with taking it after lunch she has no side effects  She is having variable blood sugars in the mornings and although they are relatively better there are still mostly high  Blood sugars are generally lower in the afternoon and evening  She is trying to improve her diet but her weight has not come down  She sometimes goes off her diet with eating ice cream and chips  She has started doing a little walking although not enough and inconsistent  Has not had anymore steroids lately  Mean values apply above for all meters except median for One Touch  PRE-MEAL Fasting Lunch Dinner Bedtime Overall  Glucose range: 125-248   133-166  119-152    Mean/median: 180     162    Weight history:  Wt Readings from Last 3 Encounters:  05/25/15 241 lb 9.6 oz (109.589 kg)  05/12/15 239 lb 12 oz (108.75 kg)  04/27/15 241 lb 9.6 oz (109.589 kg)   Previous consultation with dietitian: None Exercise regimen: Walking  2/7   LABS:  Lab Results  Component Value Date   HGBA1C 8.6* 04/25/2015   HGBA1C 7.3* 11/28/2014   HGBA1C 5.4 06/07/2014   Lab Results  Component Value Date   MICROALBUR 17.3* 11/28/2014   LDLCALC 65 10/13/2014   CREATININE 0.97 05/25/2015     HYPOTHYROIDISM: This was first diagnosed in 1992 after her treatment for Graves' disease with I-131 She has been on relatively large doses of thyroxine supplements She was on 224 mcg since 11/13 and her TSH was normal in 3/14 Subjectively difficult to assess her thyroid since she tends to have fatigue chronically.  In 2014 her dose was reduced to 200 mcg daily and her dose has been fluctuating since then She has generally required periodic increase in dosage including in 05/2014 when her TSH was 20  She is now on 2 tablets of 137 g daily, continues on the brand name Synthroid  Her symptoms are usually nonspecific for hypothyroidism, mostly related to other medical problems including sleep disturbances and bipolar illness.   She has been quite regular with the Synthroid in the mornings without any interfering substances         TSH is normal consistently recently:   Lab Results  Component Value Date   TSH 3.70 04/25/2015   TSH 4.13 01/25/2015   TSH 3.09 07/27/2014   FREET4 1.11 04/25/2015   FREET4 1.24 01/25/2015   FREET4 1.18 07/27/2014     Office Visit on 05/25/2015  Component Date Value Ref Range Status  . PTH 05/25/2015 22  15 - 65 pg/mL Final  . Sodium 05/25/2015 138  135 - 145 mEq/L Final  . Potassium 05/25/2015 4.3  3.5 - 5.1 mEq/L Final  . Chloride 05/25/2015 102  96 - 112 mEq/L Final  . CO2 05/25/2015 24  19 - 32 mEq/L Final  . Glucose, Bld 05/25/2015 139* 70 - 99 mg/dL Final  . BUN 05/25/2015 19  6 - 23 mg/dL Final  . Creatinine, Ser 05/25/2015 0.97  0.40 - 1.20 mg/dL Final  . Calcium 05/25/2015 10.3  8.4 - 10.5 mg/dL Final  . GFR 05/25/2015 62.95  >60.00 mL/min Final      Medication List       This list  is accurate as of: 05/25/15 11:59 PM.  Always use your most recent med list.               aspirin 81 MG tablet  Take 81 mg by mouth daily.     atorvastatin 80 MG tablet  Commonly known as:  LIPITOR  TAKE 1 TABLET(80 MG) BY MOUTH DAILY     b complex vitamins capsule  Take 1 capsule by mouth daily.     buPROPion 150 MG 24 hr tablet  Commonly known as:  WELLBUTRIN XL  Take 450 mg by mouth daily.     Calcium 600-200 MG-UNIT tablet  Take 1 tablet by mouth 2 (two) times daily.     canagliflozin 100 MG Tabs tablet  Commonly known as:  INVOKANA  1 tablet before breakfast     chlorproMAZINE 25 MG tablet  Commonly known as:  THORAZINE  Take 25-50 mg by mouth as needed. Reported on 03/02/2015     cloNIDine 0.1 MG tablet  Commonly known as:  CATAPRES  TAKE 1 TABLET BY MOUTH TWICE DAILY     co-enzyme Q-10 50 MG capsule  Take 50 mg by mouth daily.     cyclobenzaprine 10 MG tablet  Commonly known as:  FLEXERIL  TAKE 2 TABLETS BY MOUTH EVERY NIGHT AT BEDTIME     diazepam 10 MG tablet  Commonly known as:  VALIUM  Take 2.5-10 mg by mouth as needed for anxiety.     diltiazem 240 MG 24 hr capsule  Commonly known as:  CARDIZEM CD  Take 1 capsule (240 mg total) by mouth daily.     diphenhydrAMINE 50 MG tablet  Commonly known as:  BENADRYL  Take 100 mg by mouth at bedtime as needed (anxiety).     DRAMAMINE PO  Take by mouth as needed.     fluticasone 50 MCG/ACT nasal spray  Commonly known as:  FLONASE  USE 2 SPRAYS IN EACH NOSTRIL TWICE DAILY     gabapentin 300 MG capsule  Commonly known as:  NEURONTIN  TAKE 1 CAPSULE(300 MG) BY MOUTH THREE TIMES DAILY     GAS-X PO  Take 1 tablet by mouth 4 (four) times daily.     glucose blood test strip  Commonly known as:  ONETOUCH VERIO  Use as instructed to check blood sugar twice a day     Insulin Pen Needle 31G X 5 MM Misc  Use once a day with Victoza pen     lamoTRIgine 200 MG tablet  Commonly known as:  LAMICTAL  Take 400  mg by mouth daily.     latanoprost 0.005 % ophthalmic solution  Commonly known as:  XALATAN  Place 1 drop into both eyes At  bedtime.     letrozole 2.5 MG tablet  Commonly known as:  FEMARA  Take 1 tablet (2.5 mg total) by mouth daily.     linaclotide 290 MCG Caps capsule  Commonly known as:  LINZESS  Take 1 capsule (290 mcg total) by mouth daily.     mesalamine 1.2 g EC tablet  Commonly known as:  LIALDA  Take 1 tablet (1.2 g total) by mouth daily with breakfast.     metFORMIN 500 MG 24 hr tablet  Commonly known as:  GLUCOPHAGE-XR  Take 3 tablets (1,500 mg total) by mouth daily with supper.     multivitamin tablet  Take 1 tablet by mouth daily.     nebivolol 10 MG tablet  Commonly known as:  BYSTOLIC  Take 1 tablet (10 mg total) by mouth 2 (two) times daily.     nitrofurantoin 100 MG capsule  Commonly known as:  MACRODANTIN  Take 100 mg by mouth as directed. Reported on 03/02/2015     Omega-3 Krill Oil 500 MG Caps  Take 500 mg by mouth.     pantoprazole 40 MG tablet  Commonly known as:  PROTONIX  TAKE 1 TABLET BY MOUTH TWICE DAILY     PROBIOTIC DAILY PO  Take by mouth.     promethazine 25 MG tablet  Commonly known as:  PHENERGAN  Take 1 tablet (25 mg total) by mouth every 6 (six) hours as needed for nausea or vomiting.     ranitidine 300 MG tablet  Commonly known as:  ZANTAC  Take 1 tablet (300 mg total) by mouth at bedtime.     SALONPAS PAIN RELIEF PATCH EX  Apply topically as needed. Reported on 03/02/2015     SAPHRIS 10 MG Subl  Generic drug:  Asenapine Maleate  Place 20 mg under the tongue at bedtime.     senna-docusate 8.6-50 MG tablet  Commonly known as:  Senokot-S  Take 3 tablets by mouth daily.     sertraline 100 MG tablet  Commonly known as:  ZOLOFT  Take 100 mg by mouth daily.     spironolactone 25 MG tablet  Commonly known as:  ALDACTONE  Take 25 mg by mouth 2 (two) times daily.     SYNTHROID 137 MCG tablet  Generic drug:  levothyroxine   TAKE 2 TABLETS BY MOUTH DAILY BEFORE BREAKFAST.     ticagrelor 90 MG Tabs tablet  Commonly known as:  BRILINTA  Take 1 tablet (90 mg total) by mouth 2 (two) times daily.     tiZANidine 4 MG tablet  Commonly known as:  ZANAFLEX  Take 4 mg by mouth. Reported on 03/02/2015     valACYclovir 500 MG tablet  Commonly known as:  VALTREX  Take 500 mg by mouth 2 (two) times daily.     VICTOZA 18 MG/3ML Sopn  Generic drug:  Liraglutide  ADMINISTER 1.8 MG UNDER THE SKIN DAILY AT THE SAME TIME EVERY DAY     vitamin C 500 MG tablet  Commonly known as:  ASCORBIC ACID  Take 250 mg by mouth daily. Takes 2 a day     Vitamin D3 2000 units Tabs  Take 1 tablet by mouth daily.     zolpidem 10 MG tablet  Commonly known as:  AMBIEN  Take 10 mg by mouth at bedtime.        Allergies:  Allergies  Allergen Reactions  . Aspirin     REACTION: aggrivates colitis  . Crestor [Rosuvastatin]  REACTION: increased lfts  . Erythromycin     REACTION: GI upset  . Nsaids     REACTION: aggrivate colitis  . Oxycodone Anxiety    Past Medical History  Diagnosis Date  . Rosacea   . Family history of malignant neoplasm of gastrointestinal tract   . Allergic rhinitis, cause unspecified   . Anxiety state, unspecified   . Unspecified asthma(493.90)   . Benign neoplasm of other and unspecified site of the digestive system   . Ulcerative colitis, unspecified   . Depression   . Fibromyalgia   . GERD (gastroesophageal reflux disease)   . Hiatal hernia   . Pure hypercholesterolemia   . Unspecified essential hypertension   . Unspecified hypothyroidism   . Insomnia, unspecified   . History of migraines   . Other screening mammogram   . Asymptomatic postmenopausal status (age-related) (natural)   . Unspecified vitamin D deficiency   . Edema   . Dysuria   . History of ovarian cyst   . Cyst     on Achilles tendon  . Tubular adenoma of colon 01/10/11  . Vertigo   . Subacute confusional state  11/19/2012  . ADD (attention deficit disorder) 11/19/2012  . OCD (obsessive compulsive disorder)   . Glaucoma   . History of alcoholism ([place])   . Bipolar disorder ([place])   . Diabetes mellitus ([place])     frank  . Atypical hyperplasia of left breast 2000  . Headache     migraines  . Arthritis   . Breast cancer ([place])     Left Breast 39mm invasive CA left uiq, dcis left uoq and a lot ADH, ALH in both breasts.  . Carcinoma of left breast ([place]) 06/02/2014  . CAD (coronary artery disease)     a. 08/2014 NSTEMI/Cath: LM nl, LAD 50p, D1/D2 min irregs, LCX nl, OM1 40, LPDA nl, RI 99ost (2.43mm vessel->med Rx), RCA nl, AM 60, nl EF.  Marland Kitchen Chronic diastolic CHF (congestive heart failure) ([place])     a. 08/2014 Echo: EF 60-65%, Gr 1 DD, nl LA.  Marland Kitchen Myocardial infarction [facility]) 09-04-14    Past Surgical History  Procedure Laterality Date  . Wisdom tooth extraction  1980's  . Tonsillectomy  1984  . Cesarean section  1996/1997    placenta previa, gest DM, pre-eclampsia  . Nasal sinus surgery  05/1997  . Precancerous mole removed    . Cholecystectomy  1997    adhesions also  . Meckel diverticulum excision  1999  . Dexa  04/1999 and 2010    normal  . Colonoscopy  01/2001    Ulcerative colitis  . Esophagogastroduodenoscopy  09/2001    polyp  . Exercise stress test  11/2003    negative  . Nuclear stress test  06/2007    negative  . Colonoscopy  04/2004    UC, polyp  . Colonoscopy  12/08    UC, no polyps  . Sleep study  11/09    no apnea, but did snore (done by [facility])  . Bunionectomy Right   . Femur fracture surgery Left   . Appendectomy    . Tubal ligation    . Simple mastectomy with axillary sentinel node biopsy Bilateral 05/23/2014    Procedure: Bilateral simple mastectomy, left sentinel node biopsy ;  Surgeon: Christene Lye, MD;  Location: [facility] ORS;  Service: General;  Laterality: Bilateral;  . Axillary sentinel node biopsy Left 05/23/2014    Procedure: AXILLARY SENTINEL NODE BIOPSY;  Surgeon:  Seeplaputhur Robinette Haines, MD;  Location: [facility] ORS;  Service: General;  Laterality: Left;  Marland Kitchen Mastectomy Bilateral 05-23-14    Dr. Jamal Collin  . Breast biopsy  Aug 2000    on tamoxifen, atypical hyperplasia  . Breast surgery Left Aug 2000    lumpectomy/ Dr Sharlet Salina  . Breast surgery Bilateral 05/23/14    Mastectomy  . Cardiac catheterization N/A 09/05/2014    Procedure: Left Heart Cath and Coronary Angiography;  Surgeon: Wellington Hampshire, MD;  Location: [facility];  Service: Cardiovascular;  Laterality: N/A;  . Cardiac catheterization  09-05-14  . Cholecystect  1999    Family History  Problem Relation Age of Onset  . Coronary artery disease Father   . Colon cancer Father   . Alzheimer's disease Father   . Dementia Father   . Coronary artery disease Mother   . Hypertension Mother   . Osteoporosis Mother   . Colitis Mother   . Crohn's disease Mother   . Breast cancer      great aunts  . Coronary artery disease      Uncle (also AAA)  . Diabetes      remote family history  . Stroke Cousin   . Esophageal cancer Neg Hx   . Rectal cancer Neg Hx   . Stomach cancer Neg Hx     Social History:  reports that she quit smoking about 22 years ago. Her smoking use included Cigarettes. She has a 1.25 pack-year smoking history. She has never used smokeless tobacco. She reports that she does not drink alcohol or use illicit drugs.  REVIEW Of SYSTEMS:  HYPERCALCEMIA: Her calcium has been higher lately and was 10.8 on the last visit.  She is not taking large amounts of vitamin D.  Not on HCTZ  Lab Results  Component Value Date   CALCIUM 10.3 05/25/2015   PHOS 2.3 01/29/2007      She is taking Aldactone 25 mgTwice a day for acne but it helps her edema also  HYPERTENSION: Blood pressure is controlled, managed by cardiologist.   Currently on low-dose clonidine, Bystolic twice a day and diltiazem  History of hypercholesterolemia: Previously on 20 mg Lipitor now 80 mg since her MI Her  cardiologist feels that ALT is high from fatty liver  Lab Results  Component Value Date   CHOL 154 11/28/2014   HDL 39.40 11/28/2014   LDLCALC 65 10/13/2014   LDLDIRECT 94.0 11/28/2014   TRIG 235.0* 11/28/2014   CHOLHDL 4 11/28/2014   Lab Results  Component Value Date   ALT 72* 02/07/2015      Examination:   BP 132/82 mmHg  Pulse 96  Temp(Src) 97.7 F (36.5 C)  Resp 16  Ht 5\' 6"  (1.676 m)  Wt 241 lb 9.6 oz (109.589 kg)  BMI 39.01 kg/m2  SpO2 90%  LMP 08/23/2006 (Approximate)   She looks cushingoid in the face    Assessments   DIABETES:  See history of present illness for detailed discussion of his current management, blood sugar patterns and problems identified  Her blood sugars are improving with adding Invokana but she has still high fasting readings which can be done as late as 12 noon Blood sugars appear to have had an increase after getting steroids earlier this year and have not been easy to control Now taking Victoza and metformin also Currently taking 1500 mg of metformin ER without side effects She does also have a history of chronic constipation for other reasons  Recommendations today:  Continue to try and improve her diet and avoid higher fat snacks  More regular exercise  She will try to increase her metformin to 2 tablets twice a day at lunch and dinner.  If she has loose stools she can reduce her other medications that she is using for constipation  Start taking 2 tablets of Invokana 100 mg daily at lunch and if tolerated and her fasting readings are better will change her prescription to 300 mg  With starting higher dose of Invokana she can hold off on Aldactone, will still need to keep monitoring her renal function and potassium with this.  Will check her potassium today  Need to check A1c in about 6 weeks  Hypothyroidism, post ablative:  She has had long-standing hypothyroidism and has normal levels of TSH fairly consistently  She will  continue her dose of 137 g, 2 tablets daily  HYPERCALCEMIA: We will need to check her PTH level to rule out early hyperparathyroidism. Also consider doing a bone density  Increased liver functions: Will need to have this followed up  Patient Instructions  2 Invokana and 2 Metformin at lunch  Take Aldactone as needed     [facility] 05/26/2015, 8:53 AM   Note: This office note was prepared with Estate agent. Any transcriptional errors that result from this process are unintentional.

## 2015-05-26 ENCOUNTER — Other Ambulatory Visit: Payer: Self-pay | Admitting: Endocrinology

## 2015-05-26 ENCOUNTER — Other Ambulatory Visit: Payer: Self-pay | Admitting: *Deleted

## 2015-05-26 LAB — PARATHYROID HORMONE, INTACT (NO CA): PTH: 22 pg/mL (ref 15–65)

## 2015-05-26 MED ORDER — CANAGLIFLOZIN 100 MG PO TABS: ORAL_TABLET | ORAL | Status: DC

## 2015-05-26 MED ORDER — METFORMIN HCL ER 500 MG PO TB24: 1500.0000 mg | ORAL_TABLET | Freq: Every day | ORAL | Status: DC

## 2015-05-26 NOTE — Progress Notes (Signed)
Quick Note:  Please let patient know that the lab result is normal and no further action needed ______ 

## 2015-05-26 NOTE — Telephone Encounter (Signed)
Patient need a refill of glucose blood (ONETOUCH VERIO) test strip send to  [facility] [id number] - [place], [place] [phone] (Phone) [phone] (Fax)

## 2015-05-29 ENCOUNTER — Ambulatory Visit (INDEPENDENT_AMBULATORY_CARE_PROVIDER_SITE_OTHER): Payer: BLUE CROSS/BLUE SHIELD | Admitting: General Surgery

## 2015-05-29 ENCOUNTER — Encounter: Payer: Self-pay | Admitting: General Surgery

## 2015-05-29 VITALS — BP 140/74 | HR 78 | Resp 16 | Ht 66.0 in | Wt 243.0 lb

## 2015-05-29 DIAGNOSIS — C50912 Malignant neoplasm of unspecified site of left female breast: Secondary | ICD-10-CM | POA: Diagnosis not present

## 2015-05-29 NOTE — Progress Notes (Signed)
Patient ID: Lindsay Fry, female   DOB: 1960/11/29, 55 y.o.   MRN: [id number]  Chief Complaint  Patient presents with  . Follow-up    breast    HPI Lindsay Fry is a 55 y.o. female follow up breast cancer-s/p bilateral mastectomy. She reports no new problems. She is now on different meds for her diabetes. Also reports she is being followed by GI for ulcerative colitis and history of polyps.  I have reviewed the history of present illness with the patient.  HPI  Past Medical History  Diagnosis Date  . Rosacea   . Family history of malignant neoplasm of gastrointestinal tract   . Allergic rhinitis, cause unspecified   . Anxiety state, unspecified   . Unspecified asthma(493.90)   . Benign neoplasm of other and unspecified site of the digestive system   . Ulcerative colitis, unspecified   . Depression   . Fibromyalgia   . GERD (gastroesophageal reflux disease)   . Hiatal hernia   . Pure hypercholesterolemia   . Unspecified essential hypertension   . Unspecified hypothyroidism   . Insomnia, unspecified   . History of migraines   . Other screening mammogram   . Asymptomatic postmenopausal status (age-related) (natural)   . Unspecified vitamin D deficiency   . Edema   . Dysuria   . History of ovarian cyst   . Cyst     on Achilles tendon  . Tubular adenoma of colon 01/10/11  . Vertigo   . Subacute confusional state 11/19/2012  . ADD (attention deficit disorder) 11/19/2012  . OCD (obsessive compulsive disorder)   . History of alcoholism ([place])   . Bipolar disorder ([place])   . Diabetes mellitus ([place])     frank  . Atypical hyperplasia of left breast 2000  . Headache     migraines  . Arthritis   . Breast cancer ([place])     Left Breast 49m invasive CA left uiq, dcis left uoq and a lot ADH, ALH in both breasts.  . [place] 06/02[place]2014  . CAD (coronary artery disease)     a. 08/2014 NSTEMI/Cath: LM nl, LAD 50p, D1/D2 min irregs, LCX nl, OM1 40, LPDA nl,  RI 99ost (2.051mvessel->med Rx), RCA nl, AM 60, nl EF.  . Marland Kitchenhronic diastolic CHF (congestive heart failure) ([place]    a. 08/2014 Echo: EF 60-65%, Gr 1 DD, nl LA.  . Marland Kitchenyocardial infarction ([id number]  . Glaucoma     pre glaucoma    Past Surgical History  Procedure Laterality Date  . Wisdom tooth extraction  1980's  . Tonsillectomy  1984  . Cesarean section  1996/1997    placenta previa, gest DM, pre-eclampsia  . Nasal sinus surgery  05/1997  . Precancerous mole removed    . Cholecystectomy  1997    adhesions also  . Meckel diverticulum excision  1999  . Dexa  04/1999 and 2010    normal  . Colonoscopy  01/2001    Ulcerative colitis  . Esophagogastroduodenoscopy  09/2001    polyp  . Exercise stress test  11/2003    negative  . Nuclear stress test  06/2007    negative  . Colonoscopy  04/2004    UC, polyp  . Colonoscopy  12/08    UC, no polyps  . Sleep study  11/09    no apnea, but did snore (done by [facility])  . Bunionectomy Right   . Femur fracture surgery Left   .  Appendectomy    . Tubal ligation    . Simple mastectomy with axillary sentinel node biopsy Bilateral 05/23/2014    Procedure: Bilateral simple mastectomy, left sentinel node biopsy ;  Surgeon: Christene Lye, MD;  Location: [facility]S;  Service: General;  Laterality: Bilateral;  . Axillary sentinel node biopsy Left 05/23/2014    Procedure: AXILLARY SENTINEL NODE BIOPSY;  Surgeon: Christene Lye, MD;  Location: [facility] ORS;  Service: General;  Laterality: Left;  Marland Kitchen Mastectomy Bilateral 05-23-14    Dr. Jamal Collin  . Breast biopsy  Aug 2000    on tamoxifen, atypical hyperplasia  . Breast surgery Left Aug 2000    lumpectomy/ Dr Sharlet Salina  . Breast surgery Bilateral 05/23/14    Mastectomy  . Cardiac catheterization N/A 09/05/2014    Procedure: Left Heart Cath and Coronary Angiography;  Surgeon: Wellington Hampshire, MD;  Location: [facility];  Service: Cardiovascular;  Laterality: N/A;  . Cardiac catheterization   09-05-14  . Cholecystect  1999    Family History  Problem Relation Age of Onset  . Coronary artery disease Father   . Colon cancer Father   . Alzheimer's disease Father   . Dementia Father   . Coronary artery disease Mother   . Hypertension Mother   . Osteoporosis Mother   . Colitis Mother   . Crohn's disease Mother   . Breast cancer      great aunts  . Coronary artery disease      Uncle (also AAA)  . Diabetes      remote family history  . Stroke Cousin   . Esophageal cancer Neg Hx   . Rectal cancer Neg Hx   . Stomach cancer Neg Hx     Social History Social History  Substance Use Topics  . Smoking status: Former Smoker -- 0.25 packs/day for 5 years    Types: Cigarettes    Quit date: 01/14/1993  . Smokeless tobacco: Never Used  . Alcohol Use: No     Comment: Recovered ETOH    Allergies  Allergen Reactions  . Aspirin     REACTION: aggrivates colitis  . Crestor [Rosuvastatin]     REACTION: increased lfts  . Erythromycin     REACTION: GI upset  . Nsaids     REACTION: aggrivate colitis  . Oxycodone Anxiety    Current Outpatient Prescriptions  Medication Sig Dispense Refill  . Asenapine Maleate (SAPHRIS) 10 MG SUBL Place 20 mg under the tongue at bedtime.    Marland Kitchen aspirin 81 MG tablet Take 81 mg by mouth daily.    Marland Kitchen atorvastatin (LIPITOR) 80 MG tablet TAKE 1 TABLET(80 MG) BY MOUTH DAILY 30 tablet 6  . b complex vitamins capsule Take 1 capsule by mouth daily.    Marland Kitchen buPROPion (WELLBUTRIN XL) 150 MG 24 hr tablet Take 450 mg by mouth daily.     . canagliflozin (INVOKANA) 100 MG TABS tablet Take 2 tablets before daily 60 tablet 3  . chlorproMAZINE (THORAZINE) 25 MG tablet Take 25-50 mg by mouth as needed. Reported on 03/02/2015    . Cholecalciferol (VITAMIN D3) 2000 UNITS TABS Take 1 tablet by mouth daily.    . cloNIDine (CATAPRES) 0.1 MG tablet TAKE 1 TABLET BY MOUTH TWICE DAILY (Patient taking differently: TAKE 1/2 TABLET BY MOUTH TWICE DAILY) 60 tablet 11  . co-enzyme  Q-10 50 MG capsule Take 50 mg by mouth daily.    . cyclobenzaprine (FLEXERIL) 10 MG tablet TAKE 2 TABLETS BY MOUTH  EVERY NIGHT AT BEDTIME 180 tablet 1  . diazepam (VALIUM) 10 MG tablet Take 2.5-10 mg by mouth as needed for anxiety.    Marland Kitchen diltiazem (CARDIZEM CD) 240 MG 24 hr capsule Take 1 capsule (240 mg total) by mouth daily. 90 capsule 3  . DimenhyDRINATE (DRAMAMINE PO) Take by mouth as needed.    . diphenhydrAMINE (BENADRYL) 50 MG tablet Take 100 mg by mouth at bedtime as needed (anxiety).     . fluticasone (FLONASE) 50 MCG/ACT nasal spray USE 2 SPRAYS IN EACH NOSTRIL TWICE DAILY 16 g 5  . gabapentin (NEURONTIN) 300 MG capsule TAKE 1 CAPSULE(300 MG) BY MOUTH THREE TIMES DAILY 90 capsule 11  . Insulin Pen Needle 31G X 5 MM MISC Use once a day with Victoza pen 30 each 3  . lamoTRIgine (LAMICTAL) 200 MG tablet Take 400 mg by mouth daily.    Marland Kitchen latanoprost (XALATAN) 0.005 % ophthalmic solution Place 1 drop into both eyes At bedtime.    Marland Kitchen letrozole (FEMARA) 2.5 MG tablet Take 1 tablet (2.5 mg total) by mouth daily. 30 tablet 6  . Linaclotide (LINZESS) 290 MCG CAPS capsule Take 1 capsule (290 mcg total) by mouth daily. 30 capsule 6  . Liniments (SALONPAS PAIN RELIEF PATCH EX) Apply topically as needed. Reported on 03/02/2015    . mesalamine (LIALDA) 1.2 g EC tablet Take 1 tablet (1.2 g total) by mouth daily with breakfast. 30 tablet 6  . metFORMIN (GLUCOPHAGE-XR) 500 MG 24 hr tablet Take 3 tablets (1,500 mg total) by mouth daily with supper. (Patient taking differently: Take 500 mg by mouth. Take 4 tabs daily with supper) 90 tablet 3  . Multiple Vitamin (MULTIVITAMIN) tablet Take 1 tablet by mouth daily.      . nebivolol (BYSTOLIC) 10 MG tablet Take 1 tablet (10 mg total) by mouth 2 (two) times daily. (Patient taking differently: Take 10 mg by mouth 2 (two) times daily. Takes 1 tablet in the morning and 2 tablets after lunch) 180 tablet 1  . nitrofurantoin (MACRODANTIN) 100 MG capsule Take 100 mg by  mouth as directed. Reported on 03/02/2015    . Omega-3 Krill Oil 500 MG CAPS Take 500 mg by mouth.    Glory Rosebush VERIO test strip USE AS DIRECTED TO CHECK BLOOD SUGAR TWICE DAILY 100 each 5  . pantoprazole (PROTONIX) 40 MG tablet TAKE 1 TABLET BY MOUTH TWICE DAILY 60 tablet 6  . Probiotic Product (PROBIOTIC DAILY PO) Take by mouth.    . promethazine (PHENERGAN) 25 MG tablet Take 1 tablet (25 mg total) by mouth every 6 (six) hours as needed for nausea or vomiting. 30 tablet 0  . ranitidine (ZANTAC) 300 MG tablet Take 1 tablet (300 mg total) by mouth at bedtime. 30 tablet 3  . senna-docusate (SENOKOT-S) 8.6-50 MG tablet Take 3 tablets by mouth daily.    . sertraline (ZOLOFT) 100 MG tablet Take 50 mg by mouth daily.     . Simethicone (GAS-X PO) Take 1 tablet by mouth 4 (four) times daily.    Marland Kitchen SYNTHROID 137 MCG tablet TAKE 2 TABLETS BY MOUTH DAILY BEFORE BREAKFAST. 60 tablet 5  . ticagrelor (BRILINTA) 90 MG TABS tablet Take 1 tablet (90 mg total) by mouth 2 (two) times daily. 180 tablet 3  . tiZANidine (ZANAFLEX) 4 MG tablet Take 4 mg by mouth. Reported on 03/02/2015    . valACYclovir (VALTREX) 500 MG tablet Take 500 mg by mouth 2 (two) times daily.      Marland Kitchen  VICTOZA 18 MG/3ML SOPN ADMINISTER 1.8 MG UNDER THE SKIN DAILY AT THE SAME TIME EVERY DAY 9 mL 3  . vitamin C (ASCORBIC ACID) 500 MG tablet Take 250 mg by mouth daily. Takes 2 a day    . zolpidem (AMBIEN) 10 MG tablet Take 10 mg by mouth at bedtime.     No current facility-administered medications for this visit.    Review of Systems Review of Systems  Constitutional: Negative.   Respiratory: Negative.   Cardiovascular: Negative.     Blood pressure 140/74, pulse 78, resp. rate 16, height 5' 6"  (1.676 m), weight 243 lb (110.224 kg), last menstrual period 08/23/2006.  Physical Exam Physical Exam  Constitutional: She is oriented to person, place, and time. She appears well-developed and well-nourished.  Eyes: Conjunctivae are normal. No  scleral icterus.  Neck: Neck supple.  Cardiovascular: Normal rate, regular rhythm and normal heart sounds.   Pulmonary/Chest: Effort normal and breath sounds normal.  Both mastecomy incisions are well healed. No signs of local recurrence.  Abdominal: Soft. Bowel sounds are normal. There is no hepatomegaly. There is no tenderness.  Lymphadenopathy:    She has no cervical adenopathy.    She has no axillary adenopathy.  Neurological: She is alert and oriented to person, place, and time.  Skin: Skin is warm and dry.    Data Reviewed Prior notes.   Assessment    Left breast CA, ER/PR pos, her 2 neg. S/P bilateral mastectomy. Now on Letrazole and doing well.  She has gained 6 lbs in last 6 mos. Her weight remains a significant issue and she is aware of it.      Plan    Follow up in 6 months    PCP:  [facility],  This information has been scribed by Gaspar Cola CMA.   SANKAR,SEEPLAPUTHUR G 05/29/2015, 1:02 PM

## 2015-06-08 ENCOUNTER — Encounter: Payer: Self-pay | Admitting: Internal Medicine

## 2015-06-09 ENCOUNTER — Other Ambulatory Visit: Payer: Self-pay

## 2015-06-09 MED ORDER — ONDANSETRON HCL 4 MG PO TABS: 4.0000 mg | ORAL_TABLET | Freq: Three times a day (TID) | ORAL | Status: DC | PRN

## 2015-06-13 DIAGNOSIS — F3131 Bipolar disorder, current episode depressed, mild: Secondary | ICD-10-CM | POA: Diagnosis not present

## 2015-06-14 ENCOUNTER — Other Ambulatory Visit: Payer: Self-pay

## 2015-06-14 DIAGNOSIS — Z9013 Acquired absence of bilateral breasts and nipples: Secondary | ICD-10-CM | POA: Diagnosis not present

## 2015-06-14 DIAGNOSIS — C50112 Malignant neoplasm of central portion of left female breast: Secondary | ICD-10-CM | POA: Diagnosis not present

## 2015-06-14 DIAGNOSIS — C50111 Malignant neoplasm of central portion of right female breast: Secondary | ICD-10-CM | POA: Diagnosis not present

## 2015-06-14 MED ORDER — NEBIVOLOL HCL 10 MG PO TABS: 10.0000 mg | ORAL_TABLET | Freq: Three times a day (TID) | ORAL | Status: DC

## 2015-06-14 NOTE — Telephone Encounter (Signed)
Pt would like to up her doseage to 3 a day

## 2015-06-19 ENCOUNTER — Other Ambulatory Visit (INDEPENDENT_AMBULATORY_CARE_PROVIDER_SITE_OTHER): Payer: BLUE CROSS/BLUE SHIELD

## 2015-06-19 DIAGNOSIS — C50912 Malignant neoplasm of unspecified site of left female breast: Secondary | ICD-10-CM

## 2015-06-19 DIAGNOSIS — E1165 Type 2 diabetes mellitus with hyperglycemia: Secondary | ICD-10-CM

## 2015-06-19 LAB — COMPREHENSIVE METABOLIC PANEL
ALT: 45 U/L — ABNORMAL HIGH (ref 0–35)
AST: 34 U/L (ref 0–37)
Albumin: 4.8 g/dL (ref 3.5–5.2)
Alkaline Phosphatase: 112 U/L (ref 39–117)
BUN: 14 mg/dL (ref 6–23)
CO2: 29 mEq/L (ref 19–32)
Calcium: 10.5 mg/dL (ref 8.4–10.5)
Chloride: 103 mEq/L (ref 96–112)
Creatinine, Ser: 0.75 mg/dL (ref 0.40–1.20)
GFR: 84.68 mL/min (ref >60.00)
Glucose, Bld: 145 mg/dL — ABNORMAL HIGH (ref 70–99)
Potassium: 5 mEq/L (ref 3.5–5.1)
Sodium: 142 mEq/L (ref 135–145)
Total Bilirubin: 0.3 mg/dL (ref 0.2–1.2)
Total Protein: 6.9 g/dL (ref 6.0–8.3)

## 2015-06-19 LAB — HEMOGLOBIN A1C: Hgb A1c MFr Bld: 7.1 % — ABNORMAL HIGH (ref 4.6–6.5)

## 2015-06-19 LAB — TSH: TSH: 2.07 u[IU]/mL (ref 0.35–4.50)

## 2015-06-22 ENCOUNTER — Encounter: Payer: Self-pay | Admitting: Endocrinology

## 2015-06-22 ENCOUNTER — Ambulatory Visit (INDEPENDENT_AMBULATORY_CARE_PROVIDER_SITE_OTHER): Payer: BLUE CROSS/BLUE SHIELD | Admitting: Endocrinology

## 2015-06-22 VITALS — BP 116/88 | HR 92 | Temp 98.7°F | Resp 16 | Ht 66.0 in | Wt 240.0 lb

## 2015-06-22 DIAGNOSIS — E890 Postprocedural hypothyroidism: Secondary | ICD-10-CM

## 2015-06-22 DIAGNOSIS — K7689 Other specified diseases of liver: Secondary | ICD-10-CM

## 2015-06-22 DIAGNOSIS — E1165 Type 2 diabetes mellitus with hyperglycemia: Secondary | ICD-10-CM | POA: Diagnosis not present

## 2015-06-22 DIAGNOSIS — R945 Abnormal results of liver function studies: Secondary | ICD-10-CM

## 2015-06-22 MED ORDER — CANAGLIFLOZIN 300 MG PO TABS: 300.0000 mg | ORAL_TABLET | Freq: Every day | ORAL | Status: DC

## 2015-06-22 NOTE — Progress Notes (Signed)
Patient ID: Lindsay Fry, female   DOB: 09-23-1960, 55 y.o.   MRN: [id number]   Reason for Appointment:  Hypothyroidism and diabetes, followup visit  History of Present Illness:  DIABETES: With her high A1c of 6.7 in 03/2014 she had an abnormal glucose tolerance test indicating diabetes and 1 hour glucose of 300 She was started on metformin; however even with 1500 mg of metformin ER  blood sugars were not controlled especially fasting Subsequently with taking Janumet XR 100/1000 daily her blood sugars were much better  Because of her weight gain and A1c going up to 7.3 along with occasional readings over 200 she was started on Victoza in 11/16 She has had progressive hyperglycemia recently and A1c was up to 8.6% in 4/17  A1c is now down to 7.1 within 2 months of her high reading  Non-insulin hypoglycemic drugs: Victoza 1.8 mg daily, metformin ER 1500 mg daily, Invokana 200 mg daily  Current management, blood sugar patterns and problems identified:  She has started taking Invokana since 4/17.  Initially had some nausea but now is able to tolerate 200 mg daily with taking it after lunch   She is having less consistently high readings in the mornings now compared to her last visit  However recently has checked her blood sugars very infrequently, has only 6 readings in the last 2 weeks  She thinks that now she has increase satiety and is not having as many large portions, snacks or sweets  She has been doing periodic walking although not enough and inconsistent  Has lost only 1 pound since her last visit  Has not had anymore steroids   GLUCOSE readings from review of One Touch Verio monitor:   PRE-MEAL Fasting Lunch Dinner Bedtime Overall  Glucose range: 141, 166 169,174     Mean/median:     151    Weight history:  Wt Readings from Last 3 Encounters:  06/22/15 240 lb (108.863 kg)  05/29/15 243 lb (110.224 kg)  05/25/15 241 lb 9.6 oz (109.589 kg)   Previous  consultation with dietitian:   Exercise regimen: Walking 2/7, 30 min   LABS:  Lab Results  Component Value Date   HGBA1C 7.1* 06/19/2015   HGBA1C 8.6* 04/25/2015   HGBA1C 7.3* 11/28/2014   Lab Results  Component Value Date   MICROALBUR 17.3* 11/28/2014   LDLCALC 65 10/13/2014   CREATININE 0.75 06/19/2015     HYPOTHYROIDISM: This was first diagnosed in 1992 after her treatment for Graves' disease with I-131 She has been on relatively large doses of thyroxine supplements She was on 224 mcg since 11/13 and her TSH was normal in 3/14 Subjectively difficult to assess her thyroid since she tends to have fatigue chronically.  In 2014 her dose was reduced to 200 mcg daily and her dose has been fluctuating since then She has generally required periodic increase in dosage including in 05/2014 when her TSH was 20  She is now on 2 tablets of 137 g daily, continues on the brand name Synthroid  Her symptoms are usually nonspecific for hypothyroidism, mostly related to other medical problems including sleep disturbances and bipolar illness.   She has been quite regular with the Synthroid in the mornings without any interfering substances         TSH is normal consistently:   Lab Results  Component Value Date   TSH 2.07 06/19/2015   TSH 3.70 04/25/2015   TSH 4.13 01/25/2015   FREET4 1.11 04/25/2015  FREET4 1.24 01/25/2015   FREET4 1.18 07/27/2014     Lab on 06/19/2015  Component Date Value Ref Range Status  . Hgb A1c MFr Bld 06/19/2015 7.1* 4.6 - 6.5 % Final   Glycemic Control Guidelines for People with Diabetes:Non Diabetic:  <6%Goal of Therapy: <7%Additional Action Suggested:  >8%   . Sodium 06/19/2015 142  135 - 145 mEq/L Final  . Potassium 06/19/2015 5.0  3.5 - 5.1 mEq/L Final  . Chloride 06/19/2015 103  96 - 112 mEq/L Final  . CO2 06/19/2015 29  19 - 32 mEq/L Final  . Glucose, Bld 06/19/2015 145* 70 - 99 mg/dL Final  . BUN 06/19/2015 14  6 - 23 mg/dL Final  .  Creatinine, Ser 06/19/2015 0.75  0.40 - 1.20 mg/dL Final  . Total Bilirubin 06/19/2015 0.3  0.2 - 1.2 mg/dL Final  . Alkaline Phosphatase 06/19/2015 112  39 - 117 U/L Final  . AST 06/19/2015 34  0 - 37 U/L Final  . ALT 06/19/2015 45* 0 - 35 U/L Final  . Total Protein 06/19/2015 6.9  6.0 - 8.3 g/dL Final  . Albumin 06/19/2015 4.8  3.5 - 5.2 g/dL Final  . Calcium 06/19/2015 10.5  8.4 - 10.5 mg/dL Final  . GFR 06/19/2015 84.68  >60.00 mL/min Final  . TSH 06/19/2015 2.07  0.35 - 4.50 uIU/mL Final      Medication List       This list is accurate as of: 06/22/15  9:05 PM.  Always use your most recent med list.               aspirin 81 MG tablet  Take 81 mg by mouth daily.     atorvastatin 80 MG tablet  Commonly known as:  LIPITOR  TAKE 1 TABLET(80 MG) BY MOUTH DAILY     b complex vitamins capsule  Take 1 capsule by mouth daily.     buPROPion 150 MG 24 hr tablet  Commonly known as:  WELLBUTRIN XL  Take 450 mg by mouth daily.     canagliflozin 300 MG Tabs tablet  Commonly known as:  INVOKANA  Take 1 tablet (300 mg total) by mouth daily before breakfast.     chlorproMAZINE 25 MG tablet  Commonly known as:  THORAZINE  Take 25-50 mg by mouth as needed. Reported on 03/02/2015     cloNIDine 0.1 MG tablet  Commonly known as:  CATAPRES  TAKE 1 TABLET BY MOUTH TWICE DAILY     co-enzyme Q-10 50 MG capsule  Take 50 mg by mouth daily.     cyclobenzaprine 10 MG tablet  Commonly known as:  FLEXERIL  TAKE 2 TABLETS BY MOUTH EVERY NIGHT AT BEDTIME     diazepam 10 MG tablet  Commonly known as:  VALIUM  Take 2.5-10 mg by mouth as needed for anxiety.     diltiazem 240 MG 24 hr capsule  Commonly known as:  CARDIZEM CD  Take 1 capsule (240 mg total) by mouth daily.     diphenhydrAMINE 50 MG tablet  Commonly known as:  BENADRYL  Take 100 mg by mouth at bedtime as needed (anxiety).     DRAMAMINE PO  Take by mouth as needed.     fluticasone 50 MCG/ACT nasal spray  Commonly known  as:  FLONASE  USE 2 SPRAYS IN EACH NOSTRIL TWICE DAILY     gabapentin 300 MG capsule  Commonly known as:  NEURONTIN  TAKE 1 CAPSULE(300 MG) BY MOUTH THREE TIMES  DAILY     GAS-X PO  Take 1 tablet by mouth 4 (four) times daily.     Insulin Pen Needle 31G X 5 MM Misc  Use once a day with Victoza pen     lamoTRIgine 200 MG tablet  Commonly known as:  LAMICTAL  Take 400 mg by mouth daily.     latanoprost 0.005 % ophthalmic solution  Commonly known as:  XALATAN  Place 1 drop into both eyes At bedtime.     letrozole 2.5 MG tablet  Commonly known as:  FEMARA  Take 1 tablet (2.5 mg total) by mouth daily.     linaclotide 290 MCG Caps capsule  Commonly known as:  LINZESS  Take 1 capsule (290 mcg total) by mouth daily.     mesalamine 1.2 g EC tablet  Commonly known as:  LIALDA  Take 1 tablet (1.2 g total) by mouth daily with breakfast.     metFORMIN 500 MG 24 hr tablet  Commonly known as:  GLUCOPHAGE-XR  Take 3 tablets (1,500 mg total) by mouth daily with supper.     multivitamin tablet  Take 1 tablet by mouth daily.     nebivolol 10 MG tablet  Commonly known as:  BYSTOLIC  Take 1 tablet (10 mg total) by mouth 3 (three) times daily. Takes 1 tablet in the morning and 2 tablets after lunch     nitrofurantoin 100 MG capsule  Commonly known as:  MACRODANTIN  Take 100 mg by mouth as directed. Reported on 03/02/2015     Omega-3 Krill Oil 500 MG Caps  Take 500 mg by mouth.     ondansetron 4 MG tablet  Commonly known as:  ZOFRAN  Take 1 tablet (4 mg total) by mouth every 8 (eight) hours as needed for nausea or vomiting.     ONETOUCH VERIO test strip  Generic drug:  glucose blood  USE AS DIRECTED TO CHECK BLOOD SUGAR TWICE DAILY     pantoprazole 40 MG tablet  Commonly known as:  PROTONIX  TAKE 1 TABLET BY MOUTH TWICE DAILY     PROBIOTIC DAILY PO  Take by mouth.     promethazine 25 MG tablet  Commonly known as:  PHENERGAN  Take 1 tablet (25 mg total) by mouth every 6  (six) hours as needed for nausea or vomiting.     ranitidine 300 MG tablet  Commonly known as:  ZANTAC  Take 1 tablet (300 mg total) by mouth at bedtime.     SALONPAS PAIN RELIEF PATCH EX  Apply topically as needed. Reported on 03/02/2015     SAPHRIS 10 MG Subl  Generic drug:  Asenapine Maleate  Place 20 mg under the tongue at bedtime.     senna-docusate 8.6-50 MG tablet  Commonly known as:  Senokot-S  Take 3 tablets by mouth daily.     sertraline 100 MG tablet  Commonly known as:  ZOLOFT  Take 50 mg by mouth daily.     SYNTHROID 137 MCG tablet  Generic drug:  levothyroxine  TAKE 2 TABLETS BY MOUTH DAILY BEFORE BREAKFAST.     ticagrelor 90 MG Tabs tablet  Commonly known as:  BRILINTA  Take 1 tablet (90 mg total) by mouth 2 (two) times daily.     tiZANidine 4 MG tablet  Commonly known as:  ZANAFLEX  Take 4 mg by mouth. Reported on 03/02/2015     valACYclovir 500 MG tablet  Commonly known as:  VALTREX  Take 500 mg  by mouth 2 (two) times daily.     VICTOZA 18 MG/3ML Sopn  Generic drug:  Liraglutide  ADMINISTER 1.8 MG UNDER THE SKIN DAILY AT THE SAME TIME EVERY DAY     vitamin C 500 MG tablet  Commonly known as:  ASCORBIC ACID  Take 250 mg by mouth daily. Takes 2 a day     Vitamin D3 2000 units Tabs  Take 1 tablet by mouth daily.     zolpidem 10 MG tablet  Commonly known as:  AMBIEN  Take 10 mg by mouth at bedtime.        Allergies:  Allergies  Allergen Reactions  . Aspirin     REACTION: aggrivates colitis  . Crestor [Rosuvastatin]     REACTION: increased lfts  . Erythromycin     REACTION: GI upset  . Nsaids     REACTION: aggrivate colitis  . Oxycodone Anxiety    Past Medical History  Diagnosis Date  . Rosacea   . Family history of malignant neoplasm of gastrointestinal tract   . Allergic rhinitis, cause unspecified   . Anxiety state, unspecified   . Unspecified asthma(493.90)   . Benign neoplasm of other and unspecified site of the digestive  system   . Ulcerative colitis, unspecified   . Depression   . Fibromyalgia   . GERD (gastroesophageal reflux disease)   . Hiatal hernia   . Pure hypercholesterolemia   . Unspecified essential hypertension   . Unspecified hypothyroidism   . Insomnia, unspecified   . History of migraines   . Other screening mammogram   . Asymptomatic postmenopausal status (age-related) (natural)   . Unspecified vitamin D deficiency   . Edema   . Dysuria   . History of ovarian cyst   . Cyst     on Achilles tendon  . Tubular adenoma of colon 01/10/11  . Vertigo   . Subacute confusional state 11/19/2012  . ADD (attention deficit disorder) 11/19/2012  . OCD (obsessive compulsive disorder)   . History of alcoholism ([place])   . Bipolar disorder ([place])   . Diabetes mellitus ([place])     frank  . Atypical hyperplasia of left breast 2000  . Headache     migraines  . Arthritis   . Breast cancer ([place])     Left Breast 51mm invasive CA left uiq, dcis left uoq and a lot ADH, ALH in both breasts.  . Carcinoma of left breast ([place]) 06/02/2014  . CAD (coronary artery disease)     a. 08/2014 NSTEMI/Cath: LM nl, LAD 50p, D1/D2 min irregs, LCX nl, OM1 40, LPDA nl, RI 99ost (2.69mm vessel->med Rx), RCA nl, AM 60, nl EF.  Marland Kitchen Chronic diastolic CHF (congestive heart failure) ([place])     a. 08/2014 Echo: EF 60-65%, Gr 1 DD, nl LA.  Marland Kitchen Myocardial infarction ([place]) 09-04-14  . Glaucoma     pre glaucoma    Past Surgical History  Procedure Laterality Date  . Wisdom tooth extraction  1980's  . Tonsillectomy  1984  . Cesarean section  1996/1997    placenta previa, gest DM, pre-eclampsia  . Nasal sinus surgery  05/1997  . Precancerous mole removed    . Cholecystectomy  1997    adhesions also  . Meckel diverticulum excision  1999  . Dexa  04/1999 and 2010    normal  . Colonoscopy  01/2001    Ulcerative colitis  . Esophagogastroduodenoscopy  09/2001    polyp  . Exercise stress test  11/2003  negative  . Nuclear stress test   06/2007    negative  . Colonoscopy  04/2004    UC, polyp  . Colonoscopy  12/08    UC, no polyps  . Sleep study  11/09    no apnea, but did snore (done by [facility])  . Bunionectomy Right   . Femur fracture surgery Left   . Appendectomy    . Tubal ligation    . Simple mastectomy with axillary sentinel node biopsy Bilateral 05/23/2014    Procedure: Bilateral simple mastectomy, left sentinel node biopsy ;  Surgeon: Christene Lye, MD;  Location: [facility];  Service: General;  Laterality: Bilateral;  . Axillary sentinel node biopsy Left 05/23/2014    Procedure: AXILLARY SENTINEL NODE BIOPSY;  Surgeon: Christene Lye, MD;  Location: [facility];  Service: General;  Laterality: Left;  Marland Kitchen Mastectomy Bilateral 05-23-14    Dr. Jamal Collin  . Breast biopsy  Aug 2000    on tamoxifen, atypical hyperplasia  . Breast surgery Left Aug 2000    lumpectomy/ Dr Sharlet Salina  . Breast surgery Bilateral 05/23/14    Mastectomy  . Cardiac catheterization N/A 09/05/2014    Procedure: Left Heart Cath and Coronary Angiography;  Surgeon: Wellington Hampshire, MD;  Location: [facility];  Service: Cardiovascular;  Laterality: N/A;  . Cardiac catheterization  09-05-14  . Cholecystect  1999    Family History  Problem Relation Age of Onset  . Coronary artery disease Father   . Colon cancer Father   . Alzheimer's disease Father   . Dementia Father   . Coronary artery disease Mother   . Hypertension Mother   . Osteoporosis Mother   . Colitis Mother   . Crohn's disease Mother   . Breast cancer      great aunts  . Coronary artery disease      Uncle (also AAA)  . Diabetes      remote family history  . Stroke Cousin   . Esophageal cancer Neg Hx   . Rectal cancer Neg Hx   . Stomach cancer Neg Hx     Social History:  reports that she quit smoking about 22 years ago. Her smoking use included Cigarettes. She has a 1.25 pack-year smoking history. She has never used smokeless tobacco. She reports that she does  not drink alcohol or use illicit drugs.  REVIEW Of SYSTEMS:  HYPERCALCEMIA: Her calcium has been higher lately and was As high as 10.8 Calcium supplements had been stopped She is taking OTC vitamin D3, 2000 units for quite some time Previous level is 22  Lab Results  Component Value Date   CALCIUM 10.5 06/19/2015   PHOS 2.3 01/29/2007     HYPERTENSION: Blood pressure is controlled, managed by cardiologist.   Currently on low-dose clonidine, Bystolic twice a day and diltiazem  ALDACTONE was stopped with starting Aldactone, renal function stable  Lab Results  Component Value Date   CREATININE 0.75 06/19/2015   BUN 14 06/19/2015   NA 142 06/19/2015   K 5.0 06/19/2015   CL 103 06/19/2015   CO2 29 06/19/2015     History of hypercholesterolemia: Previously on 20 mg Lipitor now 80 mg since her MI Her cardiologist feels that ALT is high from fatty liver, this was 72 and 1/17  Lab Results  Component Value Date   CHOL 154 11/28/2014   HDL 39.40 11/28/2014   LDLCALC 65 10/13/2014   LDLDIRECT 94.0 11/28/2014   TRIG 235.0* 11/28/2014  CHOLHDL 4 11/28/2014   Lab Results  Component Value Date   ALT 45* 06/19/2015      Examination:   BP 116/88 mmHg  Pulse 92  Temp(Src) 98.7 F (37.1 C) (Oral)  Resp 16  Ht 5\' 6"  (1.676 m)  Wt 240 lb (108.863 kg)  BMI 38.76 kg/m2  LMP 08/23/2006 (Approximate)   She looks cushingoid in the face No ankle edema    Assessments   DIABETES:  See history of present illness for detailed discussion of his current management, blood sugar patterns and problems identified  Her blood sugars are improving with Increasing Invokana but she has relatively high high fasting readings She has started improving her diet but not exercising enough No side effects from Invokana now  Recommendations today:  More consistent monitoring at various times, discussed blood sugar targets  Increase frequency of exercise  Increasing Invokana up to 300  mg, will need to periodically monitor potassium level is high normal, discussed avoiding high potassium containing foods especially fruits  She will continue metformin and Victoza also  Hypothyroidism, post ablative:  She has had long-standing hypothyroidism and has normal levels of TSH fairly consistently  She will continue her dose of 137 g, 2 tablets daily  HYPERCALCEMIA: Upper normal and stable, etiology unclear, will also repeat 1 in the levels on the next visit  HYPERTENSION: High normal diastolic, continue to follow, also she will be followed by other physicians  Increased liver functions: Improved, will periodically follow  There are no Patient Instructions on file for this visit.  Total visit time for review of multiple problems, review of home glucose records, labs and counseling = 25 min.  Suleima Ohlendorf 06/22/2015, 9:05 PM   Note: This office note was prepared with Dragon voice recognition system technology. Any transcriptional errors that result from this process are unintentional.

## 2015-06-22 NOTE — Progress Notes (Signed)
Pre visit review using our clinic review tool, if applicable. No additional management support is needed unless otherwise documented below in the visit note. 

## 2015-06-26 DIAGNOSIS — F3174 Bipolar disorder, in full remission, most recent episode manic: Secondary | ICD-10-CM | POA: Diagnosis not present

## 2015-06-26 DIAGNOSIS — F3176 Bipolar disorder, in full remission, most recent episode depressed: Secondary | ICD-10-CM | POA: Diagnosis not present

## 2015-06-27 ENCOUNTER — Encounter: Payer: Self-pay | Admitting: Oncology

## 2015-06-28 ENCOUNTER — Other Ambulatory Visit: Payer: Self-pay | Admitting: Internal Medicine

## 2015-06-28 ENCOUNTER — Other Ambulatory Visit: Payer: Self-pay | Admitting: Family Medicine

## 2015-07-04 ENCOUNTER — Encounter: Payer: Self-pay | Admitting: Endocrinology

## 2015-07-06 ENCOUNTER — Other Ambulatory Visit: Payer: Self-pay

## 2015-07-06 ENCOUNTER — Other Ambulatory Visit: Payer: Self-pay | Admitting: *Deleted

## 2015-07-06 MED ORDER — LINACLOTIDE 290 MCG PO CAPS: 290.0000 ug | ORAL_CAPSULE | Freq: Every day | ORAL | Status: DC

## 2015-07-06 MED ORDER — METFORMIN HCL ER 500 MG PO TB24: ORAL_TABLET | ORAL | Status: DC

## 2015-07-06 NOTE — Telephone Encounter (Signed)
Refilled linzess electronically per fax request from pharmacy

## 2015-07-13 ENCOUNTER — Encounter: Payer: Self-pay | Admitting: Endocrinology

## 2015-07-13 ENCOUNTER — Other Ambulatory Visit: Payer: Self-pay | Admitting: Endocrinology

## 2015-07-20 ENCOUNTER — Other Ambulatory Visit: Payer: Self-pay | Admitting: Cardiovascular Disease

## 2015-07-25 ENCOUNTER — Encounter: Payer: Self-pay | Admitting: Oncology

## 2015-07-25 ENCOUNTER — Encounter: Payer: Self-pay | Admitting: Internal Medicine

## 2015-07-26 ENCOUNTER — Other Ambulatory Visit: Payer: Self-pay

## 2015-07-26 ENCOUNTER — Encounter: Payer: Self-pay | Admitting: Internal Medicine

## 2015-07-26 MED ORDER — RANITIDINE HCL 300 MG PO TABS: 150.0000 mg | ORAL_TABLET | Freq: Two times a day (BID) | ORAL | Status: DC

## 2015-08-09 ENCOUNTER — Ambulatory Visit: Payer: BLUE CROSS/BLUE SHIELD | Admitting: Oncology

## 2015-08-09 ENCOUNTER — Other Ambulatory Visit: Payer: BLUE CROSS/BLUE SHIELD

## 2015-08-17 ENCOUNTER — Other Ambulatory Visit: Payer: BLUE CROSS/BLUE SHIELD

## 2015-08-17 ENCOUNTER — Ambulatory Visit: Payer: BLUE CROSS/BLUE SHIELD | Admitting: Internal Medicine

## 2015-08-18 ENCOUNTER — Other Ambulatory Visit: Payer: Self-pay | Admitting: *Deleted

## 2015-08-18 DIAGNOSIS — Z853 Personal history of malignant neoplasm of breast: Secondary | ICD-10-CM

## 2015-08-21 ENCOUNTER — Inpatient Hospital Stay: Payer: BLUE CROSS/BLUE SHIELD

## 2015-08-21 ENCOUNTER — Inpatient Hospital Stay: Payer: BLUE CROSS/BLUE SHIELD | Attending: Internal Medicine | Admitting: Internal Medicine

## 2015-08-21 DIAGNOSIS — C50812 Malignant neoplasm of overlapping sites of left female breast: Secondary | ICD-10-CM | POA: Diagnosis not present

## 2015-08-21 DIAGNOSIS — E559 Vitamin D deficiency, unspecified: Secondary | ICD-10-CM | POA: Insufficient documentation

## 2015-08-21 DIAGNOSIS — I5032 Chronic diastolic (congestive) heart failure: Secondary | ICD-10-CM | POA: Diagnosis not present

## 2015-08-21 DIAGNOSIS — E039 Hypothyroidism, unspecified: Secondary | ICD-10-CM | POA: Diagnosis not present

## 2015-08-21 DIAGNOSIS — Z7984 Long term (current) use of oral hypoglycemic drugs: Secondary | ICD-10-CM | POA: Insufficient documentation

## 2015-08-21 DIAGNOSIS — I252 Old myocardial infarction: Secondary | ICD-10-CM | POA: Insufficient documentation

## 2015-08-21 DIAGNOSIS — K449 Diaphragmatic hernia without obstruction or gangrene: Secondary | ICD-10-CM | POA: Insufficient documentation

## 2015-08-21 DIAGNOSIS — Z853 Personal history of malignant neoplasm of breast: Secondary | ICD-10-CM

## 2015-08-21 DIAGNOSIS — E119 Type 2 diabetes mellitus without complications: Secondary | ICD-10-CM | POA: Diagnosis not present

## 2015-08-21 DIAGNOSIS — M1990 Unspecified osteoarthritis, unspecified site: Secondary | ICD-10-CM

## 2015-08-21 DIAGNOSIS — Z170 Estrogen receptor positive status [ER+]: Secondary | ICD-10-CM | POA: Diagnosis not present

## 2015-08-21 DIAGNOSIS — Z9013 Acquired absence of bilateral breasts and nipples: Secondary | ICD-10-CM | POA: Diagnosis not present

## 2015-08-21 DIAGNOSIS — M797 Fibromyalgia: Secondary | ICD-10-CM | POA: Insufficient documentation

## 2015-08-21 DIAGNOSIS — N951 Menopausal and female climacteric states: Secondary | ICD-10-CM

## 2015-08-21 DIAGNOSIS — I110 Hypertensive heart disease with heart failure: Secondary | ICD-10-CM | POA: Insufficient documentation

## 2015-08-21 DIAGNOSIS — K219 Gastro-esophageal reflux disease without esophagitis: Secondary | ICD-10-CM | POA: Insufficient documentation

## 2015-08-21 DIAGNOSIS — K5190 Ulcerative colitis, unspecified, without complications: Secondary | ICD-10-CM

## 2015-08-21 DIAGNOSIS — Z79811 Long term (current) use of aromatase inhibitors: Secondary | ICD-10-CM

## 2015-08-21 DIAGNOSIS — F429 Obsessive-compulsive disorder, unspecified: Secondary | ICD-10-CM | POA: Diagnosis not present

## 2015-08-21 DIAGNOSIS — J45909 Unspecified asthma, uncomplicated: Secondary | ICD-10-CM | POA: Diagnosis not present

## 2015-08-21 DIAGNOSIS — F319 Bipolar disorder, unspecified: Secondary | ICD-10-CM | POA: Diagnosis not present

## 2015-08-21 DIAGNOSIS — E7800 Pure hypercholesterolemia, unspecified: Secondary | ICD-10-CM | POA: Diagnosis not present

## 2015-08-21 DIAGNOSIS — Z79899 Other long term (current) drug therapy: Secondary | ICD-10-CM

## 2015-08-21 DIAGNOSIS — Z7982 Long term (current) use of aspirin: Secondary | ICD-10-CM | POA: Insufficient documentation

## 2015-08-21 DIAGNOSIS — H409 Unspecified glaucoma: Secondary | ICD-10-CM | POA: Insufficient documentation

## 2015-08-21 DIAGNOSIS — I2510 Atherosclerotic heart disease of native coronary artery without angina pectoris: Secondary | ICD-10-CM | POA: Diagnosis not present

## 2015-08-21 DIAGNOSIS — Z87891 Personal history of nicotine dependence: Secondary | ICD-10-CM | POA: Insufficient documentation

## 2015-08-21 DIAGNOSIS — Z794 Long term (current) use of insulin: Secondary | ICD-10-CM | POA: Insufficient documentation

## 2015-08-21 DIAGNOSIS — D0511 Intraductal carcinoma in situ of right breast: Secondary | ICD-10-CM | POA: Diagnosis not present

## 2015-08-21 LAB — COMPREHENSIVE METABOLIC PANEL
ALT: 63 U/L — ABNORMAL HIGH (ref 14–54)
AST: 46 U/L — ABNORMAL HIGH (ref 15–41)
Albumin: 4.4 g/dL (ref 3.5–5.0)
Alkaline Phosphatase: 123 U/L (ref 38–126)
Anion gap: 8 (ref 5–15)
BUN: 13 mg/dL (ref 6–20)
CO2: 26 mmol/L (ref 22–32)
Calcium: 9.4 mg/dL (ref 8.9–10.3)
Chloride: 103 mmol/L (ref 101–111)
Creatinine, Ser: 0.79 mg/dL (ref 0.44–1.00)
GFR calc Af Amer: >60 mL/min (ref >60)
GFR calc non Af Amer: >60 mL/min (ref >60)
Glucose, Bld: 151 mg/dL — ABNORMAL HIGH (ref 65–99)
Potassium: 4.1 mmol/L (ref 3.5–5.1)
Sodium: 137 mmol/L (ref 135–145)
Total Bilirubin: 0.6 mg/dL (ref 0.3–1.2)
Total Protein: 7.4 g/dL (ref 6.5–8.1)

## 2015-08-21 NOTE — Assessment & Plan Note (Addendum)
#   LEFT Breast Stage I s/p mastec; on Low risk mammoprint- on Letrozle. NED.   # Hot flashes- G-1-2.  I encouraged the patient to discuss with her psychiatristre: use of effexor  # Ulcerative colitis/ polyps- needs colonoscopy.   # Arthritis- sec to AI. stable.   # Depression on Zoloft.   Dr. Levonne Spiller- Pscychiatrist; [place].   # follow up in 6 months/labs.

## 2015-08-21 NOTE — Progress Notes (Signed)
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  No invasive cancer in the right breast.  Status post bilateral mastectomy   Oncology History   1.  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  Left breast: T1 b N0 M0 stage IB estrogen and progesterone receptor HER-2/neu Not overexpressed 2.    Multi-gene analysis with  MAMOPRINT low risk for recurrent disease Patient was started on letrozole and calcium and vitamin D (June, 2016)     Carcinoma of left breast ([place])   05/23/2014 Initial Diagnosis    Carcinoma of left breast      Cancer of overlapping sites of left female breast [facility])    INTERVAL HISTORY:  Lindsay Fry 55 y.o.  female pleasant patient above history of Left-sided breast cancer and also right-sided DCIS status post bilateral mastectomy currently on letrozole is here for follow-up.  Patient denies any lumps or bumps. She has chronic arthralgias which are improved on Neurontin.  She complains of significant hot flashes.   REVIEW OF SYSTEMS:  A complete 10 point review of system is done which is negative except mentioned above/history of present illness.   PAST MEDICAL HISTORY :  Past Medical History:  Diagnosis Date  . ADD (attention deficit disorder) 11/19/2012  . Allergic rhinitis, cause unspecified   . Anxiety state, unspecified   . Arthritis   . Asymptomatic postmenopausal status (age-related) (natural)   . Atypical hyperplasia of left breast 2000  . Benign neoplasm of other and unspecified site of the digestive system   . Bipolar disorder ([place])   . Breast cancer ([place])    Left Breast 29m invasive CA left uiq, dcis left uoq and a lot ADH, ALH in both breasts.  .Marland KitchenCAD (coronary artery disease)    a. 08/2014 NSTEMI/Cath: LM nl, LAD 50p, D1/D2 min irregs, LCX nl, OM1 40, LPDA nl, RI 99ost (2.04mvessel->med Rx), RCA nl, AM 60, nl EF.  . Carcinoma of left breast ([facility]5/19/2016  . Chronic diastolic CHF (congestive heart failure) (HCOlin   a. 08/2014 Echo: EF 60-65%, Gr 1 DD, nl LA.  . Marland Kitchenyst    on Achilles tendon  . Depression   . Diabetes mellitus ([place]   frank  . Dysuria   . Edema   . Family history of malignant neoplasm of gastrointestinal tract   . Fibromyalgia   . GERD (gastroesophageal reflux disease)   . Glaucoma    pre glaucoma  . Headache    migraines  . Hiatal hernia   .  History of alcoholism ([place])   . History of migraines   . History of ovarian cyst   . Insomnia, unspecified   . Myocardial infarction ([place]) 09-04-14  . OCD (obsessive compulsive disorder)   . Other screening mammogram   . Pure hypercholesterolemia   . Rosacea   . Subacute confusional state 11/19/2012  . Tubular adenoma of colon 01/10/11  . Ulcerative colitis, unspecified   . Unspecified asthma(493.90)   . Unspecified essential hypertension   . Unspecified hypothyroidism   . Unspecified vitamin D deficiency   . Vertigo     PAST SURGICAL HISTORY :   Past Surgical  History:  Procedure Laterality Date  . APPENDECTOMY    . AXILLARY SENTINEL NODE BIOPSY Left 05/23/2014   Procedure: AXILLARY SENTINEL NODE BIOPSY;  Surgeon: Christene Lye, MD;  Location: [facility] ORS;  Service: General;  Laterality: Left;  . BREAST BIOPSY  Aug 2000   on tamoxifen, atypical hyperplasia  . BREAST SURGERY Left Aug 2000   lumpectomy/ Dr Sharlet Salina  . BREAST SURGERY Bilateral 05/23/14   Mastectomy  . BUNIONECTOMY Right   . CARDIAC CATHETERIZATION N/A 09/05/2014   Procedure: Left Heart Cath and Coronary Angiography;  Surgeon: Wellington Hampshire, MD;  Location: [place] CV LAB;  Service: Cardiovascular;  Laterality: N/A;  . CARDIAC CATHETERIZATION  09-05-14  . CESAREAN SECTION  1996/1997   placenta previa, gest DM, pre-eclampsia  . cholecystect  1999  . CHOLECYSTECTOMY  1997   adhesions also  . COLONOSCOPY  01/2001   Ulcerative colitis  . COLONOSCOPY  04/2004   UC, polyp  . COLONOSCOPY  12/08   UC, no polyps  . DEXA  04/1999 and 2010   normal  . ESOPHAGOGASTRODUODENOSCOPY  09/2001   polyp  . exercise stress test  11/2003   negative  . FEMUR FRACTURE SURGERY Left   . MASTECTOMY Bilateral 05-23-14   Dr. Jamal Collin  . MECKEL DIVERTICULUM EXCISION  1999  . NASAL SINUS SURGERY  05/1997  . nuclear stress test  06/2007   negative  . precancerous mole removed    . SIMPLE MASTECTOMY WITH AXILLARY SENTINEL NODE BIOPSY Bilateral 05/23/2014   Procedure: Bilateral simple mastectomy, left sentinel node biopsy ;  Surgeon: Christene Lye, MD;  Location: [facility] ORS;  Service: General;  Laterality: Bilateral;  . Sleep study  11/09   no apnea, but did snore (done by [facility])  . TONSILLECTOMY  1984  . TUBAL LIGATION    . WISDOM TOOTH EXTRACTION  1980's    FAMILY HISTORY :   Family History  Problem Relation Age of Onset  . Coronary artery disease Father   . Colon cancer Father   . Alzheimer's disease Father   . Dementia Father   . Coronary artery disease Mother   . Hypertension  Mother   . Osteoporosis Mother   . Colitis Mother   . Crohn's disease Mother   . Breast cancer      great aunts  . Coronary artery disease      Uncle (also AAA)  . Diabetes      remote family history  . Stroke Cousin   . Esophageal cancer Neg Hx   . Rectal cancer Neg Hx   . Stomach cancer Neg Hx     SOCIAL HISTORY:   Social History  Substance Use Topics  . Smoking status: Former Smoker    Packs/day: 0.25    Years: 5.00    Types: Cigarettes    Quit date:  01/14/1993  . Smokeless tobacco: Never Used  . Alcohol use No     Comment: Recovered ETOH    ALLERGIES:  is allergic to ephedrine; aspirin; crestor [rosuvastatin]; erythromycin; nsaids; and oxycodone.  MEDICATIONS:  Current Outpatient Prescriptions  Medication Sig Dispense Refill  . Asenapine Maleate (SAPHRIS) 10 MG SUBL Place 20 mg under the tongue at bedtime.    Marland Kitchen aspirin 81 MG tablet Take 81 mg by mouth daily.    Marland Kitchen atorvastatin (LIPITOR) 80 MG tablet TAKE 1 TABLET(80 MG) BY MOUTH DAILY 30 tablet 3  . b complex vitamins capsule Take 1 capsule by mouth daily.    Marland Kitchen buPROPion (WELLBUTRIN XL) 150 MG 24 hr tablet Take 450 mg by mouth daily.     . canagliflozin (INVOKANA) 300 MG TABS tablet Take 1 tablet (300 mg total) by mouth daily before breakfast. 30 tablet 3  . chlorproMAZINE (THORAZINE) 25 MG tablet Take 25-50 mg by mouth as needed. Reported on 03/02/2015    . Cholecalciferol (VITAMIN D3) 2000 UNITS TABS Take 1 tablet by mouth daily.    . cloNIDine (CATAPRES) 0.1 MG tablet TAKE 1 TABLET BY MOUTH TWICE DAILY (Patient taking differently: TAKE 1/2 TABLET BY MOUTH TWICE DAILY) 60 tablet 11  . co-enzyme Q-10 50 MG capsule Take 50 mg by mouth daily.    . cyclobenzaprine (FLEXERIL) 10 MG tablet TAKE 2 TABLETS BY MOUTH EVERY NIGHT AT BEDTIME 180 tablet 1  . diazepam (VALIUM) 10 MG tablet Take 2.5-10 mg by mouth as needed for anxiety.    Marland Kitchen diltiazem (CARDIZEM CD) 240 MG 24 hr capsule Take 1 capsule (240 mg total) by mouth daily. 90  capsule 3  . DimenhyDRINATE (DRAMAMINE PO) Take by mouth as needed.    . diphenhydrAMINE (BENADRYL) 50 MG tablet Take 100 mg by mouth at bedtime as needed (anxiety).     . fluticasone (FLONASE) 50 MCG/ACT nasal spray USE 2 SPRAYS IN EACH NOSTRIL TWICE DAILY 16 g 5  . gabapentin (NEURONTIN) 300 MG capsule TAKE 1 CAPSULE(300 MG) BY MOUTH THREE TIMES DAILY 90 capsule 11  . Insulin Pen Needle 31G X 5 MM MISC Use once a day with Victoza pen 30 each 3  . lamoTRIgine (LAMICTAL) 200 MG tablet Take 400 mg by mouth daily.    Marland Kitchen latanoprost (XALATAN) 0.005 % ophthalmic solution Place 1 drop into both eyes At bedtime.    Marland Kitchen letrozole (FEMARA) 2.5 MG tablet Take 1 tablet (2.5 mg total) by mouth daily. 30 tablet 6  . linaclotide (LINZESS) 290 MCG CAPS capsule Take 1 capsule (290 mcg total) by mouth daily. 30 capsule 6  . Liniments (SALONPAS PAIN RELIEF PATCH EX) Apply topically as needed. Reported on 03/02/2015    . LINZESS 290 MCG CAPS capsule TAKE 1 CAPSULE(290 MCG) BY MOUTH DAILY 30 capsule 0  . mesalamine (LIALDA) 1.2 g EC tablet Take 1 tablet (1.2 g total) by mouth daily with breakfast. 30 tablet 6  . metFORMIN (GLUCOPHAGE-XR) 500 MG 24 hr tablet Take 4 tabs daily. 120 tablet 3  . Multiple Vitamin (MULTIVITAMIN) tablet Take 1 tablet by mouth daily.      . nebivolol (BYSTOLIC) 10 MG tablet Take 1 tablet (10 mg total) by mouth 3 (three) times daily. Takes 1 tablet in the morning and 2 tablets after lunch 270 tablet 3  . nitrofurantoin (MACRODANTIN) 100 MG capsule Take 100 mg by mouth as directed. Reported on 03/02/2015    . Omega-3 Krill Oil 500 MG CAPS Take 500 mg  by mouth.    . ondansetron (ZOFRAN) 4 MG tablet Take 1 tablet (4 mg total) by mouth every 8 (eight) hours as needed for nausea or vomiting. 30 tablet 1  . ONETOUCH VERIO test strip USE AS DIRECTED TO CHECK BLOOD SUGAR TWICE DAILY 100 each 5  . pantoprazole (PROTONIX) 40 MG tablet TAKE 1 TABLET BY MOUTH TWICE DAILY 60 tablet 6  . Probiotic Product  (PROBIOTIC DAILY PO) Take by mouth.    . promethazine (PHENERGAN) 25 MG tablet Take 1 tablet (25 mg total) by mouth every 6 (six) hours as needed for nausea or vomiting. 30 tablet 0  . ranitidine (ZANTAC) 300 MG tablet Take 0.5 tablets (150 mg total) by mouth 2 (two) times daily. 60 tablet 3  . senna-docusate (SENOKOT-S) 8.6-50 MG tablet Take 3 tablets by mouth daily.    . sertraline (ZOLOFT) 100 MG tablet Take 50 mg by mouth daily.     . Simethicone (GAS-X PO) Take 1 tablet by mouth 4 (four) times daily.    Marland Kitchen SYNTHROID 137 MCG tablet TAKE 2 TABLETS BY MOUTH DAILY BEFORE BREAKFAST. 60 tablet 1  . ticagrelor (BRILINTA) 90 MG TABS tablet Take 1 tablet (90 mg total) by mouth 2 (two) times daily. 180 tablet 3  . tiZANidine (ZANAFLEX) 4 MG tablet Take 4 mg by mouth. Reported on 03/02/2015    . valACYclovir (VALTREX) 500 MG tablet Take 500 mg by mouth 2 (two) times daily.      Marland Kitchen VICTOZA 18 MG/3ML SOPN ADMINISTER 1.8 MG UNDER THE SKIN DAILY AT THE SAME TIME EVERY DAY 9 mL 3  . vitamin C (ASCORBIC ACID) 500 MG tablet Take 250 mg by mouth daily. Takes 2 a day    . zolpidem (AMBIEN) 10 MG tablet Take 10 mg by mouth at bedtime.     No current facility-administered medications for this visit.     PHYSICAL EXAMINATION: ECOG PERFORMANCE STATUS: 0 - Asymptomatic  BP 132/82 (BP Location: Right Arm, Patient Position: Sitting)   Pulse 84   Temp 97.9 F (36.6 C) (Tympanic)   Resp 18   Wt 241 lb (109.3 kg)   LMP 08/23/2006 (Approximate)   BMI 38.90 kg/m   Filed Weights   08/21/15 1539  Weight: 241 lb (109.3 kg)    GENERAL: Well-nourished well-developed; Alert, no distress and comfortable.   Alone.  EYES: no pallor or icterus OROPHARYNX: no thrush or ulceration; good dentition  NECK: supple, no masses felt LYMPH:  no palpable lymphadenopathy in the cervical, axillary or inguinal regions LUNGS: clear to auscultation and  No wheeze or crackles HEART/CVS: regular rate & rhythm and no murmurs; No  lower extremity edema ABDOMEN:abdomen soft, non-tender and normal bowel sounds Musculoskeletal:no cyanosis of digits and no clubbing  PSYCH: alert & oriented x 3 with fluent speech NEURO: no focal motor/sensory deficits SKIN:  no rashes or significant lesions .   LABORATORY DATA:  I have reviewed the data as listed    Component Value Date/Time   NA 137 08/21/2015 1450   K 4.1 08/21/2015 1450   CL 103 08/21/2015 1450   CO2 26 08/21/2015 1450   GLUCOSE 151 (H) 08/21/2015 1450   BUN 13 08/21/2015 1450   CREATININE 0.79 08/21/2015 1450   CALCIUM 9.4 08/21/2015 1450   PROT 7.4 08/21/2015 1450   ALBUMIN 4.4 08/21/2015 1450   AST 46 (H) 08/21/2015 1450   ALT 63 (H) 08/21/2015 1450   ALKPHOS 123 08/21/2015 1450   BILITOT 0.6 08/21/2015  1450   GFRNONAA >60 08/21/2015 1450   GFRAA >60 08/21/2015 1450    No results found for: SPEP, UPEP  Lab Results  Component Value Date   WBC 11.8 (H) 02/07/2015   NEUTROABS 8.5 (H) 02/07/2015   HGB 14.7 02/07/2015   HCT 45.4 02/07/2015   MCV 86.9 02/07/2015   PLT 484 (H) 02/07/2015      Chemistry      Component Value Date/Time   NA 137 08/21/2015 1450   K 4.1 08/21/2015 1450   CL 103 08/21/2015 1450   CO2 26 08/21/2015 1450   BUN 13 08/21/2015 1450   CREATININE 0.79 08/21/2015 1450      Component Value Date/Time   CALCIUM 9.4 08/21/2015 1450   ALKPHOS 123 08/21/2015 1450   AST 46 (H) 08/21/2015 1450   ALT 63 (H) 08/21/2015 1450   BILITOT 0.6 08/21/2015 1450       RADIOGRAPHIC STUDIES: I have personally reviewed the radiological images as listed and agreed with the findings in the report. No results found.   ASSESSMENT & PLAN:  Cancer of overlapping sites of left female breast ([place]) # LEFT Breast Stage I s/p mastec; on Low risk mammoprint- on Letrozle. NED.   # Hot flashes- G-1-2.  I encouraged the patient to discuss with her psychiatristre: use of effexor  # Ulcerative colitis/ polyps- needs colonoscopy.   # Arthritis-  sec to AI. stable.   # Depression on Zoloft.   Dr. Levonne Spiller- Pscychiatrist; [place].   # follow up in 6 months/labs.    Orders Placed This Encounter  Procedures  . CBC with Differential    Standing Status:   Future    Standing Expiration Date:   08/20/2016  . Comprehensive metabolic panel    Standing Status:   Future    Standing Expiration Date:   08/20/2016   All questions were answered. The patient knows to call the clinic with any problems, questions or concerns.      Cammie Sickle, MD 08/21/2015 4:03 PM

## 2015-08-24 ENCOUNTER — Encounter: Payer: Self-pay | Admitting: Endocrinology

## 2015-08-28 ENCOUNTER — Other Ambulatory Visit: Payer: Self-pay | Admitting: Endocrinology

## 2015-08-29 ENCOUNTER — Encounter: Payer: Self-pay | Admitting: Endocrinology

## 2015-08-31 ENCOUNTER — Ambulatory Visit (INDEPENDENT_AMBULATORY_CARE_PROVIDER_SITE_OTHER): Payer: BLUE CROSS/BLUE SHIELD | Admitting: Endocrinology

## 2015-08-31 ENCOUNTER — Encounter: Payer: Self-pay | Admitting: Endocrinology

## 2015-08-31 VITALS — BP 118/84 | HR 93 | Wt 233.0 lb

## 2015-08-31 DIAGNOSIS — E1165 Type 2 diabetes mellitus with hyperglycemia: Secondary | ICD-10-CM | POA: Diagnosis not present

## 2015-08-31 MED ORDER — INSULIN DETEMIR 100 UNIT/ML FLEXPEN: 12.0000 [IU] | PEN_INJECTOR | Freq: Every day | SUBCUTANEOUS | 3 refills | Status: DC

## 2015-08-31 NOTE — Progress Notes (Signed)
Patient ID: Lindsay Fry, female   DOB: 1960-11-18, 55 y.o.   MRN: [id number]   Reason for Appointment:  Hypothyroidism and diabetes, followup visit  History of Present Illness:  DIABETES: With her high A1c of 6.7 in 03/2014 she had an abnormal glucose tolerance test indicating diabetes and 1 hour glucose of 300 She was started on metformin; however even with 1500 mg of metformin ER  blood sugars were not controlled especially fasting Subsequently with taking Janumet XR 100/1000 daily her blood sugars were much better  Because of her weight gain and A1c going up to 7.3 along with occasional readings over 200 she was started on Victoza in 11/16 She has had progressive hyperglycemia previously and A1c was up to 8.6% in 4/17  Non-insulin hypoglycemic drugs: Victoza 1.8 mg daily, metformin ER 2000 mg daily, Invokana 300 mg daily  Current management, blood sugar patterns and problems identified:  She called this week to report progressively higher readings in the mornings  However she had misplaced her meter and had not been checking her blood sugars are early last month, has only one reading of 139 asking last month  Her blood sugars are now averaging 208 and previously were about 150  This is despite her losing weight  She does not think she has changed her diet, had any infections or taken any prednisone  She is trying to walk as much as possible  Has been compliant with all medications including 300 mg of Invokana on the new prescription  She has only a few readings in the afternoons which are slightly better but minimal monitoring before and after supper  GLUCOSE readings from review of One Touch Verio monitor:  Mean values apply above for all meters except median for One Touch  PRE-MEAL Fasting Afternoon  Dinner Bedtime Overall  Glucose range: 194-231  139-272   186    Mean/median:     203    Weight history:  Wt Readings from Last 3 Encounters:  08/31/15  233 lb (105.7 kg)  08/21/15 241 lb (109.3 kg)  06/22/15 240 lb (108.9 kg)   Previous consultation with dietitian:   Exercise regimen: Walking 2/7, 30 min  Dinner At 11pm+  LABS:  Lab Results  Component Value Date   HGBA1C 7.1 (H) 06/19/2015   HGBA1C 8.6 (H) 04/25/2015   HGBA1C 7.3 (H) 11/28/2014   Lab Results  Component Value Date   MICROALBUR 17.3 (H) 11/28/2014   LDLCALC 65 10/13/2014   CREATININE 0.79 08/21/2015     HYPOTHYROIDISM: This was first diagnosed in 1992 after her treatment for Graves' disease with I-131 She has been on relatively large doses of thyroxine supplements She was on 224 mcg since 11/13 and her TSH was normal in 3/14 Subjectively difficult to assess her thyroid since she tends to have fatigue chronically.  In 2014 her dose was reduced to 200 mcg daily and her dose has been fluctuating since then She has generally required periodic increase in dosage including in 05/2014 when her TSH was 20  She is  on 2 tablets of 137 g daily, continues on the brand name Synthroid  Her symptoms are usually nonspecific for hypothyroidism, mostly related to other medical problems including sleep disturbances and bipolar illness.   She has been quite regular with the Synthroid in the mornings without any interfering substances         TSH is normal consistently:   Lab Results  Component Value Date   TSH  2.07 06/19/2015   TSH 3.70 04/25/2015   TSH 4.13 01/25/2015   FREET4 1.11 04/25/2015   FREET4 1.24 01/25/2015   FREET4 1.18 07/27/2014     No visits with results within 1 Week(s) from this visit.  Latest known visit with results is:  Appointment on 08/21/2015  Component Date Value Ref Range Status  . Sodium 08/21/2015 137  135 - 145 mmol/L Final  . Potassium 08/21/2015 4.1  3.5 - 5.1 mmol/L Final  . Chloride 08/21/2015 103  101 - 111 mmol/L Final  . CO2 08/21/2015 26  22 - 32 mmol/L Final  . Glucose, Bld 08/21/2015 151* 65 - 99 mg/dL Final  . BUN  08/21/2015 13  6 - 20 mg/dL Final  . Creatinine, Ser 08/21/2015 0.79  0.44 - 1.00 mg/dL Final  . Calcium 08/21/2015 9.4  8.9 - 10.3 mg/dL Final  . Total Protein 08/21/2015 7.4  6.5 - 8.1 g/dL Final  . Albumin 08/21/2015 4.4  3.5 - 5.0 g/dL Final  . AST 08/21/2015 46* 15 - 41 U/L Final  . ALT 08/21/2015 63* 14 - 54 U/L Final  . Alkaline Phosphatase 08/21/2015 123  38 - 126 U/L Final  . Total Bilirubin 08/21/2015 0.6  0.3 - 1.2 mg/dL Final  . GFR calc non Af Amer 08/21/2015 >60  >60 mL/min Final  . GFR calc Af Amer 08/21/2015 >60  >60 mL/min Final   Comment: (NOTE) The eGFR has been calculated using the CKD EPI equation. This calculation has not been validated in all clinical situations. eGFR's persistently <60 mL/min signify possible Chronic Kidney Disease.   . Anion gap 08/21/2015 8  5 - 15 Final      Medication List       Accurate as of 08/31/15  9:11 PM. Always use your most recent med list.          aspirin 81 MG tablet Take 81 mg by mouth daily.   atorvastatin 80 MG tablet Commonly known as:  LIPITOR TAKE 1 TABLET(80 MG) BY MOUTH DAILY   b complex vitamins capsule Take 1 capsule by mouth daily.   buPROPion 150 MG 24 hr tablet Commonly known as:  WELLBUTRIN XL Take 450 mg by mouth daily.   canagliflozin 300 MG Tabs tablet Commonly known as:  INVOKANA Take 1 tablet (300 mg total) by mouth daily before breakfast.   chlorproMAZINE 25 MG tablet Commonly known as:  THORAZINE Take 25-50 mg by mouth as needed. Reported on 03/02/2015   cloNIDine 0.1 MG tablet Commonly known as:  CATAPRES TAKE 1 TABLET BY MOUTH TWICE DAILY   co-enzyme Q-10 50 MG capsule Take 50 mg by mouth daily.   cyclobenzaprine 10 MG tablet Commonly known as:  FLEXERIL TAKE 2 TABLETS BY MOUTH EVERY NIGHT AT BEDTIME   diazepam 10 MG tablet Commonly known as:  VALIUM Take 2.5-10 mg by mouth as needed for anxiety.   diltiazem 240 MG 24 hr capsule Commonly known as:  CARDIZEM CD Take 1  capsule (240 mg total) by mouth daily.   diphenhydrAMINE 50 MG tablet Commonly known as:  BENADRYL Take 100 mg by mouth at bedtime as needed (anxiety).   DRAMAMINE PO Take by mouth as needed.   fluticasone 50 MCG/ACT nasal spray Commonly known as:  FLONASE USE 2 SPRAYS IN EACH NOSTRIL TWICE DAILY   gabapentin 300 MG capsule Commonly known as:  NEURONTIN TAKE 1 CAPSULE(300 MG) BY MOUTH THREE TIMES DAILY   GAS-X PO Take 1 tablet by mouth  4 (four) times daily.   Insulin Detemir 100 UNIT/ML Pen Commonly known as:  LEVEMIR Inject 12 Units into the skin daily at 10 pm.   Insulin Pen Needle 31G X 5 MM Misc Use once a day with Victoza pen   lamoTRIgine 200 MG tablet Commonly known as:  LAMICTAL Take 400 mg by mouth daily.   latanoprost 0.005 % ophthalmic solution Commonly known as:  XALATAN Place 1 drop into both eyes At bedtime.   letrozole 2.5 MG tablet Commonly known as:  FEMARA Take 1 tablet (2.5 mg total) by mouth daily.   LINZESS 290 MCG Caps capsule Generic drug:  linaclotide TAKE 1 CAPSULE(290 MCG) BY MOUTH DAILY   mesalamine 1.2 g EC tablet Commonly known as:  LIALDA Take 1 tablet (1.2 g total) by mouth daily with breakfast.   metFORMIN 500 MG 24 hr tablet Commonly known as:  GLUCOPHAGE-XR Take 4 tabs daily.   multivitamin tablet Take 1 tablet by mouth daily.   nebivolol 10 MG tablet Commonly known as:  BYSTOLIC Take 1 tablet (10 mg total) by mouth 3 (three) times daily. Takes 1 tablet in the morning and 2 tablets after lunch   nitrofurantoin 100 MG capsule Commonly known as:  MACRODANTIN Take 100 mg by mouth as directed. Reported on 03/02/2015   Omega-3 Krill Oil 500 MG Caps Take 500 mg by mouth.   ondansetron 4 MG tablet Commonly known as:  ZOFRAN Take 1 tablet (4 mg total) by mouth every 8 (eight) hours as needed for nausea or vomiting.   ONETOUCH VERIO test strip Generic drug:  glucose blood USE AS DIRECTED TO CHECK BLOOD SUGAR TWICE DAILY     pantoprazole 40 MG tablet Commonly known as:  PROTONIX TAKE 1 TABLET BY MOUTH TWICE DAILY   PROBIOTIC DAILY PO Take by mouth.   promethazine 25 MG tablet Commonly known as:  PHENERGAN Take 1 tablet (25 mg total) by mouth every 6 (six) hours as needed for nausea or vomiting.   ranitidine 300 MG tablet Commonly known as:  ZANTAC Take 0.5 tablets (150 mg total) by mouth 2 (two) times daily.   SALONPAS PAIN RELIEF PATCH EX Apply topically as needed. Reported on 03/02/2015   SAPHRIS 10 MG Subl Generic drug:  Asenapine Maleate Place 20 mg under the tongue at bedtime.   senna-docusate 8.6-50 MG tablet Commonly known as:  Senokot-S Take 3 tablets by mouth daily.   sertraline 100 MG tablet Commonly known as:  ZOLOFT Take 50 mg by mouth daily.   SYNTHROID 137 MCG tablet Generic drug:  levothyroxine TAKE 2 TABLETS BY MOUTH DAILY BEFORE BREAKFAST.   ticagrelor 90 MG Tabs tablet Commonly known as:  BRILINTA Take 1 tablet (90 mg total) by mouth 2 (two) times daily.   tiZANidine 4 MG tablet Commonly known as:  ZANAFLEX Take 4 mg by mouth. Reported on 03/02/2015   valACYclovir 500 MG tablet Commonly known as:  VALTREX Take 500 mg by mouth 2 (two) times daily.   venlafaxine XR 75 MG 24 hr capsule Commonly known as:  EFFEXOR-XR   VICTOZA 18 MG/3ML Sopn Generic drug:  Liraglutide INJECT 1.8 MG UNDER THE SKIN DAILY AT THE SAME TIME EVERY DAY   vitamin C 500 MG tablet Commonly known as:  ASCORBIC ACID Take 250 mg by mouth daily. Takes 2 a day   Vitamin D3 2000 units Tabs Take 1 tablet by mouth daily.   zolpidem 10 MG tablet Commonly known as:  AMBIEN Take 10 mg  by mouth at bedtime.       Allergies:  Allergies  Allergen Reactions  . Ephedrine Other (See Comments)    Pt becomes hyper  . Aspirin     REACTION: aggrivates colitis  . Crestor [Rosuvastatin]     REACTION: increased lfts  . Erythromycin     REACTION: GI upset  . Nsaids     REACTION: aggrivate colitis   . Oxycodone Anxiety    Past Medical History:  Diagnosis Date  . ADD (attention deficit disorder) 11/19/2012  . Allergic rhinitis, cause unspecified   . Anxiety state, unspecified   . Arthritis   . Asymptomatic postmenopausal status (age-related) (natural)   . Atypical hyperplasia of left breast 2000  . Benign neoplasm of other and unspecified site of the digestive system   . Bipolar disorder ([place])   . Breast cancer ([place])    Left Breast 82m invasive CA left uiq, dcis left uoq and a lot ADH, ALH in both breasts.  .Marland KitchenCAD (coronary artery disease)    a. 08/2014 NSTEMI/Cath: LM nl, LAD 50p, D1/D2 min irregs, LCX nl, OM1 40, LPDA nl, RI 99ost (2.068mvessel->med Rx), RCA nl, AM 60, nl EF.  . Carcinoma of left breast ([place]/19/2016  . Chronic diastolic CHF (congestive heart failure) ([facility]   a. 08/2014 Echo: EF 60-65%, Gr 1 DD, nl LA.  . Marland Kitchenyst    on Achilles tendon  . Depression   . Diabetes mellitus ([facility]   frank  . Dysuria   . Edema   . Family history of malignant neoplasm of gastrointestinal tract   . Fibromyalgia   . GERD (gastroesophageal reflux disease)   . Glaucoma    pre glaucoma  . Headache    migraines  . Hiatal hernia   . History of alcoholism ([facility]  . History of migraines   . History of ovarian cyst   . Insomnia, unspecified   . Myocardial infarction ([facility]8-21[place]16  . OCD (obsessive compulsive disorder)   . Other screening mammogram   . Pure hypercholesterolemia   . Rosacea   . Subacute confusional state 11/19/2012  . Tubular adenoma of colon 01/10/11  . Ulcerative colitis, unspecified   . Unspecified asthma(493.90)   . Unspecified essential hypertension   . Unspecified hypothyroidism   . Unspecified vitamin D deficiency   . Vertigo     Past Surgical History:  Procedure Laterality Date  . APPENDECTOMY    . AXILLARY SENTINEL NODE BIOPSY Left 05/23/2014   Procedure: AXILLARY SENTINEL NODE BIOPSY;  Surgeon: SeChristene LyeMD;  Location: [facility];   Service: General;  Laterality: Left;  . BREAST BIOPSY  Aug 2000   on tamoxifen, atypical hyperplasia  . BREAST SURGERY Left Aug 2000   lumpectomy/ Dr CrSharlet Salina. BREAST SURGERY Bilateral 05/23/14   Mastectomy  . BUNIONECTOMY Right   . CARDIAC CATHETERIZATION N/A 09/05/2014   Procedure: Left Heart Cath and Coronary Angiography;  Surgeon: MuWellington HampshireMD;  [facility];  Service: Cardiovascular;  Laterality: N/A;  . CARDIAC CATHETERIZATION  09-05-14  . CESAREAN SECTION  1996/1997   placenta previa, gest DM, pre-eclampsia  . cholecystect  1999  . CHOLECYSTECTOMY  1997   adhesions also  . COLONOSCOPY  01/2001   Ulcerative colitis  . COLONOSCOPY  04/2004   UC, polyp  . COLONOSCOPY  12/08   UC, no polyps  . DEXA  04/1999 and 2010   normal  . ESOPHAGOGASTRODUODENOSCOPY  09/2001   polyp  . exercise stress test  11/2003   negative  . FEMUR FRACTURE SURGERY Left   . MASTECTOMY Bilateral 05-23-14   Dr. Jamal Collin  . MECKEL DIVERTICULUM EXCISION  1999  . NASAL SINUS SURGERY  05/1997  . nuclear stress test  06/2007   negative  . precancerous mole removed    . SIMPLE MASTECTOMY WITH AXILLARY SENTINEL NODE BIOPSY Bilateral 05/23/2014   Procedure: Bilateral simple mastectomy, left sentinel node biopsy ;  Surgeon: Christene Lye, MD;  Location: [facility] ORS;  Service: General;  Laterality: Bilateral;  . Sleep study  11/09   no apnea, but did snore (done by [facility])  . TONSILLECTOMY  1984  . TUBAL LIGATION    . WISDOM TOOTH EXTRACTION  1980's    Family History  Problem Relation Age of Onset  . Coronary artery disease Father   . Colon cancer Father   . Alzheimer's disease Father   . Dementia Father   . Coronary artery disease Mother   . Hypertension Mother   . Osteoporosis Mother   . Colitis Mother   . Crohn's disease Mother   . Breast cancer      great aunts  . Coronary artery disease      Uncle (also AAA)  . Diabetes      remote family history  . Stroke Cousin     . Esophageal cancer Neg Hx   . Rectal cancer Neg Hx   . Stomach cancer Neg Hx     Social History:  reports that she quit smoking about 22 years ago. Her smoking use included Cigarettes. She has a 1.25 pack-year smoking history. She has never used smokeless tobacco. She reports that she does not drink alcohol or use drugs.  REVIEW Of SYSTEMS:  The following is a copy of the previous note:  HYPERCALCEMIA: Her calcium has been higher lately and was As high as 10.8 Calcium supplements had been stopped She is taking OTC vitamin D3, 2000 units for quite some time Previous level is 22  Lab Results  Component Value Date   CALCIUM 9.4 08/21/2015   PHOS 2.3 01/29/2007     HYPERTENSION: Blood pressure is controlled, managed by cardiologist.   Currently on low-dose clonidine, Bystolic twice a day and diltiazem  ALDACTONE was stopped with starting Aldactone, renal function stable  Lab Results  Component Value Date   CREATININE 0.79 08/21/2015   BUN 13 08/21/2015   NA 137 08/21/2015   K 4.1 08/21/2015   CL 103 08/21/2015   CO2 26 08/21/2015     History of hypercholesterolemia: Previously on 20 mg Lipitor now 80 mg since her MI Her cardiologist feels that ALT is high from fatty liver, this was 72 and 1/17  Lab Results  Component Value Date   CHOL 154 11/28/2014   HDL 39.40 11/28/2014   LDLCALC 65 10/13/2014   LDLDIRECT 94.0 11/28/2014   TRIG 235.0 (H) 11/28/2014   CHOLHDL 4 11/28/2014   Lab Results  Component Value Date   ALT 63 (H) 08/21/2015      Examination:   BP 118/84   Pulse 93   Wt 233 lb (105.7 kg)   LMP 08/23/2006 (Approximate)   SpO2 91%   BMI 37.61 kg/m    She looks cushingoid in the face    Assessments   DIABETES:  See history of present illness for detailed discussion of his current management, blood sugar patterns and problems identified  Her blood sugars  are Significantly higher recently but not clear when they started going up since she was  monitoring irregularly Appears to have primarily high fasting readings although not checking enough readings after meals This is despite maximum doses of metformin, Victoza and Invokana Has lost weight and has not altered her diet  Recommendations today:  Start Basal insulin.  Discussed actions of basal insulin, timing of injection and how to use the Levemir insulin pen  She will start with 8 units at bedtime daily  She will increase the dose by 2 units every 3 days as given on the titration sheet until fasting blood sugars are consistently below 130  More consistent monitoring at various times, discussed blood sugar targets  She will continue metformin, Invokana and Victoza also  Hypothyroidism, post ablative:  She has had long-standing hypothyroidism on a dose of 137 g, 2 tablets daily and has normal levels of TSH fairly consistently   HYPERTENSION: High normal diastolic, continue to follow, also she will be followed by other physicians   Patient Instructions  Levemir insulin: This insulin provides blood sugar control for up to 24 hours.  Start with 10 units at bedtime daily and increase by 2 units every 3 days until the waking up sugars are under 130.   Then continue the same dose. If blood sugar is under 90 for 2 days in a row, reduce the dose by 2 units. Note that this insulin does not control the rise of blood sugar with meals    Check blood sugars on waking up    Also check blood sugars about 2 hours after a meal and do this after different meals by rotation  Recommended blood sugar levels on waking up is 90-130 and about 2 hours after meal is 130-160  Please bring your blood sugar monitor to each visit, thank you   Counseling time on subjects discussed above is over 50% of today's 25 minute visit   .  Poonam Woehrle 08/31/2015, 9:11 PM   Note: This office note was prepared with Dragon voice recognition system technology. Any transcriptional errors that result from  this process are unintentional.

## 2015-08-31 NOTE — Patient Instructions (Signed)
Levemir insulin: This insulin provides blood sugar control for up to 24 hours.  Start with 10 units at bedtime daily and increase by 2 units every 3 days until the waking up sugars are under 130.   Then continue the same dose. If blood sugar is under 90 for 2 days in a row, reduce the dose by 2 units. Note that this insulin does not control the rise of blood sugar with meals    Check blood sugars on waking up    Also check blood sugars about 2 hours after a meal and do this after different meals by rotation  Recommended blood sugar levels on waking up is 90-130 and about 2 hours after meal is 130-160  Please bring your blood sugar monitor to each visit, thank you

## 2015-09-09 ENCOUNTER — Other Ambulatory Visit: Payer: Self-pay | Admitting: Family Medicine

## 2015-09-11 DIAGNOSIS — E119 Type 2 diabetes mellitus without complications: Secondary | ICD-10-CM | POA: Diagnosis not present

## 2015-09-11 DIAGNOSIS — H40052 Ocular hypertension, left eye: Secondary | ICD-10-CM | POA: Diagnosis not present

## 2015-09-11 DIAGNOSIS — H40051 Ocular hypertension, right eye: Secondary | ICD-10-CM | POA: Diagnosis not present

## 2015-09-11 DIAGNOSIS — H5201 Hypermetropia, right eye: Secondary | ICD-10-CM | POA: Diagnosis not present

## 2015-09-11 LAB — HM DIABETES EYE EXAM

## 2015-09-11 NOTE — Telephone Encounter (Signed)
Will refill electronically  

## 2015-09-11 NOTE — Telephone Encounter (Signed)
No recent/future appts, last filled on 03/16/15 #180 tablets with 0 refills, please advise

## 2015-09-13 DIAGNOSIS — F3131 Bipolar disorder, current episode depressed, mild: Secondary | ICD-10-CM | POA: Diagnosis not present

## 2015-09-21 ENCOUNTER — Encounter: Payer: Self-pay | Admitting: *Deleted

## 2015-09-22 ENCOUNTER — Ambulatory Visit: Payer: BLUE CROSS/BLUE SHIELD | Admitting: Endocrinology

## 2015-09-28 ENCOUNTER — Other Ambulatory Visit: Payer: Self-pay | Admitting: Endocrinology

## 2015-09-29 ENCOUNTER — Encounter: Payer: Self-pay | Admitting: Endocrinology

## 2015-09-29 ENCOUNTER — Ambulatory Visit (INDEPENDENT_AMBULATORY_CARE_PROVIDER_SITE_OTHER): Payer: BLUE CROSS/BLUE SHIELD | Admitting: Endocrinology

## 2015-09-29 VITALS — BP 126/82 | HR 103 | Temp 98.0°F | Resp 16 | Ht 66.0 in | Wt 226.0 lb

## 2015-09-29 DIAGNOSIS — E038 Other specified hypothyroidism: Secondary | ICD-10-CM

## 2015-09-29 DIAGNOSIS — E119 Type 2 diabetes mellitus without complications: Secondary | ICD-10-CM | POA: Diagnosis not present

## 2015-09-29 DIAGNOSIS — E063 Autoimmune thyroiditis: Secondary | ICD-10-CM

## 2015-09-29 LAB — POCT GLYCOSYLATED HEMOGLOBIN (HGB A1C): Hemoglobin A1C: 7.3

## 2015-09-29 NOTE — Progress Notes (Addendum)
Patient ID: Lindsay Fry, female   DOB: [birth year], 55 y.o.   MRN: [id number]   Reason for Appointment:  Hypothyroidism and diabetes, followup visit  History of Present Illness:  DIABETES: With her high A1c of 6.7 in 03/2014 she had an abnormal glucose tolerance test indicating diabetes and 1 hour glucose of 300 She was started on metformin; however even with 1500 mg of metformin ER  blood sugars were not controlled especially fasting Subsequently with taking Janumet XR 100/1000 daily her blood sugars were much better  Because of her weight gain and A1c going up to 7.3 along with occasional readings over 200 she was started on Victoza in 11/16 She has had progressive hyperglycemia previously and A1c was up to 8.6% in 4/17   A1c is now 7.3 compared to 7.1 in June  Non-insulin hypoglycemic drugs: Victoza 1.8 mg daily, metformin ER 2000 mg daily, Invokana 300 mg daily INSULIN regimen: Levemir 24 units at bedtime  Current management, blood sugar patterns and problems identified:  She was started on Levemir insulin in 08/2015 because of progressive rise in blood sugars and inability to control with 3 other agents  She generally has highest blood sugars of the day fasting and was started on Levemir 8 units at night.  She has increased this progressively and last night started on 24 units using the titration worksheet given which was reviewed  She is usually taking her fasting readings at about noon  However her fasting blood sugars have come down only slowly and only this week have been around 150; glucose higher this morning at 197 because of eating french fries last night  Despite starting insulin she has lost weight.  She has started watching her diet usually better than before but currently not exercising as she does not have a walking partner  Has been compliant with all medications including 300 mg of Invokana on the new prescription  She has only a few readings in  the afternoons and evenings which are slightly better than the morning  GLUCOSE readings from review of One Touch Verio monitor:  Mean values apply above for all meters except median for One Touch  PRE-MEAL Fasting Lunch Dinner Bedtime Overall  Glucose range: 151-257   145  151-184    Mean/median: 200     195     Weight history:  Wt Readings from Last 3 Encounters:  09/29/15 226 lb (102.5 kg)  08/31/15 233 lb (105.7 kg)  08/21/15 241 lb (109.3 kg)   Previous consultation with dietitian:   Exercise regimen: none  Dinner At 11pm+  LABS:  Lab Results  Component Value Date   HGBA1C 7.3 09/29/2015   HGBA1C 7.1 (H) 06/19/2015   HGBA1C 8.6 (H) 04/25/2015   Lab Results  Component Value Date   MICROALBUR 17.3 (H) 11/28/2014   LDLCALC 65 10/13/2014   CREATININE 0.79 08/21/2015     HYPOTHYROIDISM: This was first diagnosed in 1992 after her treatment for Graves' disease with I-131 She has been on relatively large doses of thyroxine supplements She was on 224 mcg since 11/13 and her TSH was normal in 3/14 Subjectively difficult to assess her thyroid since she tends to have fatigue chronically.  In 2014 her dose was reduced to 200 mcg daily and her dose has been fluctuating since then She has generally required periodic increase in dosage including in 05/2014 when her TSH was 20  She is  on 2 tablets of 137 g daily, continues on  the brand name Synthroid  Her symptoms are usually nonspecific for hypothyroidism, mostly related to other medical problems including sleep disturbances and bipolar illness.   She has been quite regular with the Synthroid in the mornings without any interfering substances         TSH is normal consistently:   Lab Results  Component Value Date   TSH 2.07 06/19/2015   TSH 3.70 04/25/2015   TSH 4.13 01/25/2015   FREET4 1.11 04/25/2015   FREET4 1.24 01/25/2015   FREET4 1.18 07/27/2014     Office Visit on 09/29/2015  Component Date Value Ref  Range Status  . Hemoglobin A1C 09/29/2015 7.3   Final      Medication List       Accurate as of 09/29/15 11:59 PM. Always use your most recent med list.          aspirin 81 MG tablet Take 81 mg by mouth daily.   atorvastatin 80 MG tablet Commonly known as:  LIPITOR TAKE 1 TABLET(80 MG) BY MOUTH DAILY   b complex vitamins capsule Take 1 capsule by mouth daily.   buPROPion 150 MG 24 hr tablet Commonly known as:  WELLBUTRIN XL Take 450 mg by mouth daily.   canagliflozin 300 MG Tabs tablet Commonly known as:  INVOKANA Take 1 tablet (300 mg total) by mouth daily before breakfast.   chlorproMAZINE 25 MG tablet Commonly known as:  THORAZINE Take 25-50 mg by mouth as needed. Reported on 03/02/2015   cloNIDine 0.1 MG tablet Commonly known as:  CATAPRES TAKE 1 TABLET BY MOUTH TWICE DAILY   co-enzyme Q-10 50 MG capsule Take 50 mg by mouth daily.   cyclobenzaprine 10 MG tablet Commonly known as:  FLEXERIL Take 1 tablet (10 mg total) by mouth 2 (two) times daily as needed.   diazepam 10 MG tablet Commonly known as:  VALIUM Take 2.5-10 mg by mouth as needed for anxiety.   diltiazem 240 MG 24 hr capsule Commonly known as:  CARDIZEM CD Take 1 capsule (240 mg total) by mouth daily.   diphenhydrAMINE 50 MG tablet Commonly known as:  BENADRYL Take 100 mg by mouth at bedtime as needed (anxiety).   DRAMAMINE PO Take by mouth as needed.   fluticasone 50 MCG/ACT nasal spray Commonly known as:  FLONASE USE 2 SPRAYS IN EACH NOSTRIL TWICE DAILY   gabapentin 300 MG capsule Commonly known as:  NEURONTIN TAKE 1 CAPSULE(300 MG) BY MOUTH THREE TIMES DAILY   GAS-X PO Take 1 tablet by mouth 4 (four) times daily.   Insulin Detemir 100 UNIT/ML Pen Commonly known as:  LEVEMIR Inject 12 Units into the skin daily at 10 pm.   Insulin Pen Needle 31G X 5 MM Misc Use once a day with Victoza pen   lamoTRIgine 200 MG tablet Commonly known as:  LAMICTAL Take 400 mg by mouth daily.    latanoprost 0.005 % ophthalmic solution Commonly known as:  XALATAN Place 1 drop into both eyes At bedtime.   letrozole 2.5 MG tablet Commonly known as:  FEMARA Take 1 tablet (2.5 mg total) by mouth daily.   LINZESS 290 MCG Caps capsule Generic drug:  linaclotide TAKE 1 CAPSULE(290 MCG) BY MOUTH DAILY   mesalamine 1.2 g EC tablet Commonly known as:  LIALDA Take 1 tablet (1.2 g total) by mouth daily with breakfast.   metFORMIN 500 MG 24 hr tablet Commonly known as:  GLUCOPHAGE-XR Take 4 tabs daily.   multivitamin tablet Take 1 tablet by  mouth daily.   nebivolol 10 MG tablet Commonly known as:  BYSTOLIC Take 1 tablet (10 mg total) by mouth 3 (three) times daily. Takes 1 tablet in the morning and 2 tablets after lunch   nitrofurantoin 100 MG capsule Commonly known as:  MACRODANTIN Take 100 mg by mouth as directed. Reported on 03/02/2015   Omega-3 Krill Oil 500 MG Caps Take 500 mg by mouth.   ondansetron 4 MG tablet Commonly known as:  ZOFRAN Take 1 tablet (4 mg total) by mouth every 8 (eight) hours as needed for nausea or vomiting.   ONETOUCH VERIO test strip Generic drug:  glucose blood USE AS DIRECTED TO CHECK BLOOD SUGAR TWICE DAILY   pantoprazole 40 MG tablet Commonly known as:  PROTONIX TAKE 1 TABLET BY MOUTH TWICE DAILY   PROBIOTIC DAILY PO Take by mouth.   promethazine 25 MG tablet Commonly known as:  PHENERGAN Take 1 tablet (25 mg total) by mouth every 6 (six) hours as needed for nausea or vomiting.   ranitidine 300 MG tablet Commonly known as:  ZANTAC Take 0.5 tablets (150 mg total) by mouth 2 (two) times daily.   SALONPAS PAIN RELIEF PATCH EX Apply topically as needed. Reported on 03/02/2015   SAPHRIS 10 MG Subl Generic drug:  Asenapine Maleate Place 20 mg under the tongue at bedtime.   senna-docusate 8.6-50 MG tablet Commonly known as:  Senokot-S Take 3 tablets by mouth daily.   sertraline 100 MG tablet Commonly known as:  ZOLOFT Take 50  mg by mouth daily.   SYNTHROID 137 MCG tablet Generic drug:  levothyroxine TAKE 2 TABLETS BY MOUTH DAILY BEFORE BREAKFAST.   ticagrelor 90 MG Tabs tablet Commonly known as:  BRILINTA Take 1 tablet (90 mg total) by mouth 2 (two) times daily.   tiZANidine 4 MG tablet Commonly known as:  ZANAFLEX Take 4 mg by mouth. Reported on 03/02/2015   valACYclovir 500 MG tablet Commonly known as:  VALTREX Take 500 mg by mouth 2 (two) times daily.   venlafaxine XR 75 MG 24 hr capsule Commonly known as:  EFFEXOR-XR Take 75 mg by mouth. Takes 2 tablets once a day   VICTOZA 18 MG/3ML Sopn Generic drug:  Liraglutide INJECT 1.8 MG UNDER THE SKIN DAILY AT THE SAME TIME EVERY DAY   vitamin C 500 MG tablet Commonly known as:  ASCORBIC ACID Take 250 mg by mouth daily. Takes 2 a day   Vitamin D3 2000 units Tabs Take 1 tablet by mouth daily.   zolpidem 10 MG tablet Commonly known as:  AMBIEN Take 10 mg by mouth at bedtime.       Allergies:  Allergies  Allergen Reactions  . Ephedrine Other (See Comments)    Pt becomes hyper  . Aspirin     REACTION: aggrivates colitis  . Crestor [Rosuvastatin]     REACTION: increased lfts  . Erythromycin     REACTION: GI upset  . Nsaids     REACTION: aggrivate colitis  . Oxycodone Anxiety    Past Medical History:  Diagnosis Date  . ADD (attention deficit disorder) 11/19/2012  . Allergic rhinitis, cause unspecified   . Anxiety state, unspecified   . Arthritis   . Asymptomatic postmenopausal status (age-related) (natural)   . Atypical hyperplasia of left breast 2000  . Benign neoplasm of other and unspecified site of the digestive system   . Bipolar disorder ([place])   . Breast cancer ([place])    Left Breast 77mm invasive CA left  uiq, dcis left uoq and a lot ADH, ALH in both breasts.  Marland Kitchen CAD (coronary artery disease)    a. 08/2014 NSTEMI/Cath: LM nl, LAD 50p, D1/D2 min irregs, LCX nl, OM1 40, LPDA nl, RI 99ost (2.67mm vessel->med Rx), RCA nl, AM 60, nl EF.   . Carcinoma of left breast ([place]) 06/02/2014  . Chronic diastolic CHF (congestive heart failure) ([place])    a. 08/2014 Echo: EF 60-65%, Gr 1 DD, nl LA.  Marland Kitchen Cyst    on Achilles tendon  . Depression   . Diabetes mellitus ([place])    frank  . Dysuria   . Edema   . Family history of malignant neoplasm of gastrointestinal tract   . Fibromyalgia   . GERD (gastroesophageal reflux disease)   . Glaucoma    pre glaucoma  . Headache    migraines  . Hiatal hernia   . History of alcoholism ([place])   . History of migraines   . History of ovarian cyst   . Insomnia, unspecified   . Myocardial infarction ([place]) 09-04-14  . OCD (obsessive compulsive disorder)   . Other screening mammogram   . Pure hypercholesterolemia   . Rosacea   . Subacute confusional state 11/19/2012  . Tubular adenoma of colon 01/10/11  . Ulcerative colitis, unspecified   . Unspecified asthma(493.90)   . Unspecified essential hypertension   . Unspecified hypothyroidism   . Unspecified vitamin D deficiency   . Vertigo     Past Surgical History:  Procedure Laterality Date  . APPENDECTOMY    . AXILLARY SENTINEL NODE BIOPSY Left 05/23/2014   Procedure: AXILLARY SENTINEL NODE BIOPSY;  Surgeon: Christene Lye, MD;  Location: [facility] ORS;  Service: General;  Laterality: Left;  . BREAST BIOPSY  Aug 2000   on tamoxifen, atypical hyperplasia  . BREAST SURGERY Left Aug 2000   lumpectomy/ Dr Sharlet Salina  . BREAST SURGERY Bilateral 05/23/14   Mastectomy  . BUNIONECTOMY Right   . CARDIAC CATHETERIZATION N/A 09/05/2014   Procedure: Left Heart Cath and Coronary Angiography;  Surgeon: Wellington Hampshire, MD;  Location: [facility];  Service: Cardiovascular;  Laterality: N/A;  . CARDIAC CATHETERIZATION  09-05-14  . CESAREAN SECTION  1996/1997   placenta previa, gest DM, pre-eclampsia  . cholecystect  1999  . CHOLECYSTECTOMY  1997   adhesions also  . COLONOSCOPY  01/2001   Ulcerative colitis  . COLONOSCOPY  04/2004   UC, polyp  .  COLONOSCOPY  12/08   UC, no polyps  . DEXA  04/1999 and 2010   normal  . ESOPHAGOGASTRODUODENOSCOPY  09/2001   polyp  . exercise stress test  11/2003   negative  . FEMUR FRACTURE SURGERY Left   . MASTECTOMY Bilateral 05-23-14   Dr. Jamal Collin  . MECKEL DIVERTICULUM EXCISION  1999  . NASAL SINUS SURGERY  05/1997  . nuclear stress test  06/2007   negative  . precancerous mole removed    . SIMPLE MASTECTOMY WITH AXILLARY SENTINEL NODE BIOPSY Bilateral 05/23/2014   Procedure: Bilateral simple mastectomy, left sentinel node biopsy ;  Surgeon: Christene Lye, MD;  Location: [facility];  Service: General;  Laterality: Bilateral;  . Sleep study  11/09   no apnea, but did snore (done by [facility])  . TONSILLECTOMY  1984  . TUBAL LIGATION    . WISDOM TOOTH EXTRACTION  1980's    Family History  Problem Relation Age of Onset  . Coronary artery disease Father   . Colon cancer  Father   . Alzheimer's disease Father   . Dementia Father   . Coronary artery disease Mother   . Hypertension Mother   . Osteoporosis Mother   . Colitis Mother   . Crohn's disease Mother   . Breast cancer      great aunts  . Coronary artery disease      Uncle (also AAA)  . Diabetes      remote family history  . Stroke Cousin   . Esophageal cancer Neg Hx   . Rectal cancer Neg Hx   . Stomach cancer Neg Hx     Social History:  reports that she quit smoking about 22 years ago. Her smoking use included Cigarettes. She has a 1.25 pack-year smoking history. She has never used smokeless tobacco. She reports that she does not drink alcohol or use drugs.  REVIEW Of SYSTEMS:  The following is a copy of the previous note:  HYPERCALCEMIA: Her calcium has been higher lately and was As high as 10.8 Calcium supplements had been stopped She is taking OTC vitamin D3, 2000 units for quite some time Previous level is 22  Lab Results  Component Value Date   CALCIUM 9.4 08/21/2015   PHOS 2.3 01/29/2007     HYPERTENSION:  Blood pressure is controlled, managed by cardiologist.   Currently on low-dose clonidine, Bystolic twice a day and diltiazem    Lab Results  Component Value Date   CREATININE 0.79 08/21/2015   BUN 13 08/21/2015   NA 137 08/21/2015   K 4.1 08/21/2015   CL 103 08/21/2015   CO2 26 08/21/2015     History of hypercholesterolemia: Previously on 20 mg Lipitor now 80 mg since her MI Her cardiologist feels that ALT is high from fatty liver, this was 72 and 1/17  Lab Results  Component Value Date   CHOL 154 11/28/2014   HDL 39.40 11/28/2014   LDLCALC 65 10/13/2014   LDLDIRECT 94.0 11/28/2014   TRIG 235.0 (H) 11/28/2014   CHOLHDL 4 11/28/2014   Lab Results  Component Value Date   ALT 63 (H) 08/21/2015      Examination:   BP 126/82   Pulse (!) 103   Temp 98 F (36.7 C)   Resp 16   Ht 5\' 6"  (1.676 m)   Wt 226 lb (102.5 kg)   LMP 08/23/2006 (Approximate)   SpO2 95%   BMI 36.48 kg/m    She looks cushingoid in the face    Assessments   DIABETES:  See history of present illness for detailed discussion of his current management, blood sugar patterns and problems identified  Her blood sugars are gradually improving but she has gone up to 24 units of basal insulin Fasting blood sugars are approaching 150 now She probably can do better with starting exercise program and may not need further increase in insulin  Recommendations today:  No change in Basal insulin with 24 units as yet.  Start exercise easier with walking or swimming  A few more blood sugars to be done after meals  More consistent diet and avoid high-fat foods and snacks  She will continue metformin, Invokana and Victoza also  Hypothyroidism, post ablative:  She will need follow-up on the next visit   Patient Instructions  Check blood sugars on waking up  daily  Also check blood sugars about 2 hours after a meal and do this after different meals by rotation  Recommended blood sugar levels on  waking up is 90-130  and about 2 hours after meal is 130-160  Please bring your blood sugar monitor to each visit, thank you  Restart Exercise      .  Tate Jerkins 10/01/2015, 9:30 PM   Note: This office note was prepared with Dragon voice recognition system technology. Any transcriptional errors that result from this process are unintentional.

## 2015-09-29 NOTE — Patient Instructions (Addendum)
Check blood sugars on waking up  daily  Also check blood sugars about 2 hours after a meal and do this after different meals by rotation  Recommended blood sugar levels on waking up is 90-130 and about 2 hours after meal is 130-160  Please bring your blood sugar monitor to each visit, thank you  Restart Exercise

## 2015-10-01 ENCOUNTER — Other Ambulatory Visit: Payer: Self-pay | Admitting: Internal Medicine

## 2015-10-05 DIAGNOSIS — C50111 Malignant neoplasm of central portion of right female breast: Secondary | ICD-10-CM | POA: Diagnosis not present

## 2015-10-05 DIAGNOSIS — Z903 Acquired absence of stomach [part of]: Secondary | ICD-10-CM | POA: Diagnosis not present

## 2015-10-05 DIAGNOSIS — C50112 Malignant neoplasm of central portion of left female breast: Secondary | ICD-10-CM | POA: Diagnosis not present

## 2015-10-12 ENCOUNTER — Encounter: Payer: Self-pay | Admitting: *Deleted

## 2015-10-17 DIAGNOSIS — L739 Follicular disorder, unspecified: Secondary | ICD-10-CM | POA: Diagnosis not present

## 2015-10-17 DIAGNOSIS — D1800 Hemangioma unspecified site: Secondary | ICD-10-CM | POA: Diagnosis not present

## 2015-10-17 DIAGNOSIS — Z1283 Encounter for screening for malignant neoplasm of skin: Secondary | ICD-10-CM | POA: Diagnosis not present

## 2015-10-17 DIAGNOSIS — D229 Melanocytic nevi, unspecified: Secondary | ICD-10-CM | POA: Diagnosis not present

## 2015-10-23 ENCOUNTER — Other Ambulatory Visit: Payer: Self-pay | Admitting: Endocrinology

## 2015-10-31 ENCOUNTER — Other Ambulatory Visit: Payer: Self-pay | Admitting: Endocrinology

## 2015-10-31 DIAGNOSIS — D229 Melanocytic nevi, unspecified: Secondary | ICD-10-CM | POA: Diagnosis not present

## 2015-10-31 DIAGNOSIS — D485 Neoplasm of uncertain behavior of skin: Secondary | ICD-10-CM | POA: Diagnosis not present

## 2015-10-31 DIAGNOSIS — D225 Melanocytic nevi of trunk: Secondary | ICD-10-CM | POA: Diagnosis not present

## 2015-11-05 ENCOUNTER — Other Ambulatory Visit: Payer: Self-pay | Admitting: Endocrinology

## 2015-11-10 ENCOUNTER — Other Ambulatory Visit (INDEPENDENT_AMBULATORY_CARE_PROVIDER_SITE_OTHER): Payer: BLUE CROSS/BLUE SHIELD

## 2015-11-10 ENCOUNTER — Ambulatory Visit (INDEPENDENT_AMBULATORY_CARE_PROVIDER_SITE_OTHER): Payer: BLUE CROSS/BLUE SHIELD

## 2015-11-10 DIAGNOSIS — E119 Type 2 diabetes mellitus without complications: Secondary | ICD-10-CM | POA: Diagnosis not present

## 2015-11-10 DIAGNOSIS — Z23 Encounter for immunization: Secondary | ICD-10-CM | POA: Diagnosis not present

## 2015-11-10 LAB — URINALYSIS, ROUTINE W REFLEX MICROSCOPIC
Bilirubin Urine: NEGATIVE
Hgb urine dipstick: NEGATIVE
Ketones, ur: NEGATIVE
Leukocytes, UA: NEGATIVE
Nitrite: NEGATIVE
RBC / HPF: NONE SEEN (ref 0–?)
Specific Gravity, Urine: 1.015 (ref 1.000–1.030)
Urine Glucose: >=1000 — AB
Urobilinogen, UA: 0.2 (ref 0.0–1.0)
pH: 5.5 (ref 5.0–8.0)

## 2015-11-10 LAB — MICROALBUMIN / CREATININE URINE RATIO
Creatinine,U: 64.1 mg/dL
Microalb Creat Ratio: 20.8 mg/g (ref 0.0–30.0)
Microalb, Ur: 13.3 mg/dL — ABNORMAL HIGH (ref 0.0–1.9)

## 2015-11-10 LAB — LIPID PANEL
Cholesterol: 121 mg/dL (ref 0–200)
HDL: 36.5 mg/dL — ABNORMAL LOW (ref >39.00)
NonHDL: 84.91
Total CHOL/HDL Ratio: 3
Triglycerides: 217 mg/dL — ABNORMAL HIGH (ref 0.0–149.0)
VLDL: 43.4 mg/dL — ABNORMAL HIGH (ref 0.0–40.0)

## 2015-11-10 LAB — LDL CHOLESTEROL, DIRECT: Direct LDL: 67 mg/dL

## 2015-11-13 ENCOUNTER — Encounter: Payer: Self-pay | Admitting: Cardiovascular Disease

## 2015-11-13 ENCOUNTER — Ambulatory Visit (INDEPENDENT_AMBULATORY_CARE_PROVIDER_SITE_OTHER): Payer: BLUE CROSS/BLUE SHIELD | Admitting: Cardiovascular Disease

## 2015-11-13 VITALS — BP 140/100 | HR 77 | Ht 66.0 in | Wt 231.0 lb

## 2015-11-13 DIAGNOSIS — I2510 Atherosclerotic heart disease of native coronary artery without angina pectoris: Secondary | ICD-10-CM

## 2015-11-13 DIAGNOSIS — E7800 Pure hypercholesterolemia, unspecified: Secondary | ICD-10-CM | POA: Diagnosis not present

## 2015-11-13 DIAGNOSIS — E1159 Type 2 diabetes mellitus with other circulatory complications: Secondary | ICD-10-CM

## 2015-11-13 DIAGNOSIS — I10 Essential (primary) hypertension: Secondary | ICD-10-CM

## 2015-11-13 DIAGNOSIS — C50812 Malignant neoplasm of overlapping sites of left female breast: Secondary | ICD-10-CM

## 2015-11-13 LAB — T4, FREE: Free T4: 0.96 ng/dL (ref 0.60–1.60)

## 2015-11-13 LAB — TSH: TSH: 1.42 u[IU]/mL (ref 0.35–4.50)

## 2015-11-13 MED ORDER — CLONIDINE HCL 0.1 MG PO TABS: 0.1000 mg | ORAL_TABLET | Freq: Two times a day (BID) | ORAL | 3 refills | Status: DC

## 2015-11-13 MED ORDER — TICAGRELOR 60 MG PO TABS: 60.0000 mg | ORAL_TABLET | Freq: Two times a day (BID) | ORAL | 3 refills | Status: DC

## 2015-11-13 NOTE — Patient Instructions (Addendum)
Congrats on weight loss and good sugars!  Medication Instructions:   No medication changes made  Please monitor your blood pressure at home   Labwork:  No new labs needed  Testing/Procedures:  No further testing at this time   Follow-Up: It was a pleasure seeing you in the office today. Please call us if you have new issues that need to be addressed before your next appt.  [phone]  Your physician wants you to follow-up in: 12 months.  You will receive a reminder letter in the mail two months in advance. If you don't receive a letter, please call our office to schedule the follow-up appointment.  If you need a refill on your cardiac medications before your next appointment, please call your pharmacy.

## 2015-11-13 NOTE — Progress Notes (Signed)
Cardiology Office Note  Date:  11/13/2015   ID:  Lindsay Fry, Lindsay Fry 1960-05-19, MRN [id number]  PCP:  [name], MD   Chief Complaint  Patient presents with  . Coronary Artery Disease    SOB, pounding heart beat, also some bruising per pt  . Hypertension    HPI:  Lindsay Fry is a very pleasant 55 year old woman with history of  Morbid obesity, coronary artery disease,  Severe ostial ramus disease treated medically by catheterization August 2016,hypertension, hyperlipidemia,  previous symptoms of chest pain and shortness of breath.  Also with a history of migraines She presents today for follow-up of her hypertension, hyperlipidemia,  Coronary artery disease  history of  Chronic tachycardia  In follow-up, she reports that she has lost some weight, Overall feels well with no significant complaints She does have bruising, only taking brilinta once a day  Lab work reviewed with her HBA1C 7.3 Elevated LFTS  She does report having one episode of  tachycardia, palpitations, lasting 30 seconds or less.  Happens very infrequently  Tolerating bystolic 10 mg twice a day Currently does water aerobics with a friend  Previously reported having mild chronic shortness of breath on exertion, general malaise   EKG on today's visit shows normal sinus rhythm with rate 75 bpm, no significant ST or T-wave changes  Other past medical history Previously had left arm pain coming on at rest. This seemed to resolve with NSAIDs. Also with several episodes of higher blood pressure, possibly associated with stress. He states that she has been taking clonidine once a day in the evening, amlodipine in the morning  Vertigo symptoms occurred in early 2014 and recurrent August 9. She is taking meclizine. She has dizziness and feels that she is drunk when she stands up.  History of hyperlipidemia. In June 2010, total cholesterol 314.  Remote smoking for a short period of time, none recently.  PMH:    has a past medical history of ADD (attention deficit disorder) (11/19/2012); Allergic rhinitis, cause unspecified; Anxiety state, unspecified; Arthritis; Asymptomatic postmenopausal status (age-related) (natural); Atypical hyperplasia of left breast (2000); Benign neoplasm of other and unspecified site of the digestive system; Bipolar disorder ([place]); Breast cancer ([place]); CAD (coronary artery disease); Carcinoma of left breast ([place]) (06/02/2014); Chronic diastolic CHF (congestive heart failure) ([place]); Cyst; Depression; Diabetes mellitus ([place]); Dysuria; Edema; Family history of malignant neoplasm of gastrointestinal tract; Fibromyalgia; GERD (gastroesophageal reflux disease); Glaucoma; Headache; Hiatal hernia; History of alcoholism ([place]); History of migraines; History of ovarian cyst; Insomnia, unspecified; Myocardial infarction (09-04-14); OCD (obsessive compulsive disorder); Other screening mammogram; Pure hypercholesterolemia; Rosacea; Subacute confusional state (11/19/2012); Tubular adenoma of colon (01/10/11); Ulcerative colitis, unspecified; Unspecified asthma(493.90); Unspecified essential hypertension; Unspecified hypothyroidism; Unspecified vitamin D deficiency; and Vertigo.  PSH:    Past Surgical History:  Procedure Laterality Date  . APPENDECTOMY    . AXILLARY SENTINEL NODE BIOPSY Left 05/23/2014   Procedure: AXILLARY SENTINEL NODE BIOPSY;  Surgeon: Christene Lye, MD;  Location: [facility];  Service: General;  Laterality: Left;  . BREAST BIOPSY  Aug 2000   on tamoxifen, atypical hyperplasia  . BREAST SURGERY Left Aug 2000   lumpectomy/ Dr Sharlet Salina  . BREAST SURGERY Bilateral 05/23/14   Mastectomy  . BUNIONECTOMY Right   . CARDIAC CATHETERIZATION N/A 09/05/2014   Procedure: Left Heart Cath and Coronary Angiography;  Surgeon: Wellington Hampshire, MD;  Location: [facility];  Service: Cardiovascular;  Laterality: N/A;  . CARDIAC CATHETERIZATION  09-05-14  . CESAREAN  SECTION  1996/1997    placenta previa, gest DM, pre-eclampsia  . cholecystect  1999  . CHOLECYSTECTOMY  1997   adhesions also  . COLONOSCOPY  01/2001   Ulcerative colitis  . COLONOSCOPY  04/2004   UC, polyp  . COLONOSCOPY  12/08   UC, no polyps  . DEXA  04/1999 and 2010   normal  . ESOPHAGOGASTRODUODENOSCOPY  09/2001   polyp  . exercise stress test  11/2003   negative  . FEMUR FRACTURE SURGERY Left   . MASTECTOMY Bilateral 05-23-14   Dr. Jamal Collin  . MECKEL DIVERTICULUM EXCISION  1999  . NASAL SINUS SURGERY  05/1997  . nuclear stress test  06/2007   negative  . precancerous mole removed    . SIMPLE MASTECTOMY WITH AXILLARY SENTINEL NODE BIOPSY Bilateral 05/23/2014   Procedure: Bilateral simple mastectomy, left sentinel node biopsy ;  Surgeon: Christene Lye, MD;  Location: [facility];  Service: General;  Laterality: Bilateral;  . Sleep study  11/09   no apnea, but did snore (done by [facility])  . TONSILLECTOMY  1984  . TUBAL LIGATION    . WISDOM TOOTH EXTRACTION  1980's    Current Outpatient Prescriptions  Medication Sig Dispense Refill  . Asenapine Maleate (SAPHRIS) 10 MG SUBL Place 20 mg under the tongue at bedtime.    Marland Kitchen aspirin 81 MG tablet Take 81 mg by mouth daily.    Marland Kitchen atorvastatin (LIPITOR) 80 MG tablet TAKE 1 TABLET(80 MG) BY MOUTH DAILY 30 tablet 3  . b complex vitamins capsule Take 1 capsule by mouth daily.    . chlorproMAZINE (THORAZINE) 25 MG tablet Take 25-50 mg by mouth as needed. Reported on 03/02/2015    . Cholecalciferol (VITAMIN D3) 2000 UNITS TABS Take 1 tablet by mouth daily.    . cloNIDine (CATAPRES) 0.1 MG tablet Take 0.005 mg by mouth 2 (two) times daily.    Marland Kitchen co-enzyme Q-10 50 MG capsule Take 50 mg by mouth daily.    . diazepam (VALIUM) 10 MG tablet Take 2.5-10 mg by mouth as needed for anxiety.    Marland Kitchen diltiazem (CARDIZEM CD) 240 MG 24 hr capsule Take 1 capsule (240 mg total) by mouth daily. 90 capsule 3  . DimenhyDRINATE (DRAMAMINE PO) Take by mouth as needed.    . fluticasone  (FLONASE) 50 MCG/ACT nasal spray USE 2 SPRAYS IN EACH NOSTRIL TWICE DAILY 16 g 5  . gabapentin (NEURONTIN) 300 MG capsule TAKE 1 CAPSULE(300 MG) BY MOUTH THREE TIMES DAILY 90 capsule 11  . Insulin Detemir (LEVEMIR) 100 UNIT/ML Pen Inject 12 Units into the skin daily at 10 pm. (Patient taking differently: Inject 24 Units into the skin daily at 10 pm. ) 15 mL 3  . Insulin Pen Needle 31G X 5 MM MISC Use once a day with Victoza pen 30 each 3  . INVOKANA 300 MG TABS tablet TAKE 1 TABLET(300 MG) BY MOUTH DAILY BEFORE BREAKFAST 30 tablet 5  . lamoTRIgine (LAMICTAL) 200 MG tablet Take 400 mg by mouth daily.    Marland Kitchen latanoprost (XALATAN) 0.005 % ophthalmic solution Place 1 drop into both eyes At bedtime.    Marland Kitchen letrozole (FEMARA) 2.5 MG tablet Take 1 tablet (2.5 mg total) by mouth daily. 30 tablet 6  . Liniments (SALONPAS PAIN RELIEF PATCH EX) Apply topically as needed. Reported on 03/02/2015    . LINZESS 290 MCG CAPS capsule TAKE 1 CAPSULE(290 MCG) BY MOUTH DAILY 30 capsule 0  . mesalamine (LIALDA) 1.2 g  EC tablet Take 1 tablet (1.2 g total) by mouth daily with breakfast. 30 tablet 6  . metFORMIN (GLUCOPHAGE-XR) 500 MG 24 hr tablet TAKE 4 TABLETS BY MOUTH DAILY 120 tablet 3  . Multiple Vitamin (MULTIVITAMIN) tablet Take 1 tablet by mouth daily.      . nebivolol (BYSTOLIC) 10 MG tablet Take 1 tablet (10 mg total) by mouth 3 (three) times daily. Takes 1 tablet in the morning and 2 tablets after lunch 270 tablet 3  . nitrofurantoin (MACRODANTIN) 100 MG capsule Take 100 mg by mouth as directed. Reported on 03/02/2015    . Omega-3 Krill Oil 500 MG CAPS Take 500 mg by mouth.    . ondansetron (ZOFRAN) 4 MG tablet Take 1 tablet (4 mg total) by mouth every 8 (eight) hours as needed for nausea or vomiting. 30 tablet 1  . ONETOUCH VERIO test strip USE AS DIRECTED TO CHECK BLOOD SUGAR TWICE DAILY 100 each 5  . pantoprazole (PROTONIX) 40 MG tablet TAKE 1 TABLET BY MOUTH TWICE DAILY 60 tablet 3  . Probiotic Product (PROBIOTIC  DAILY PO) Take by mouth.    . promethazine (PHENERGAN) 25 MG tablet Take 1 tablet (25 mg total) by mouth every 6 (six) hours as needed for nausea or vomiting. 30 tablet 0  . ranitidine (ZANTAC) 300 MG tablet Take 0.5 tablets (150 mg total) by mouth 2 (two) times daily. 60 tablet 3  . senna-docusate (SENOKOT-S) 8.6-50 MG tablet Take 3 tablets by mouth daily.    . Simethicone (GAS-X PO) Take 1 tablet by mouth 4 (four) times daily.    Marland Kitchen SYNTHROID 137 MCG tablet TAKE 2 TABLETS BY MOUTH DAILY BEFORE BREAKFAST. 60 tablet 3  . ticagrelor (BRILINTA) 90 MG TABS tablet Take 1 tablet (90 mg total) by mouth 2 (two) times daily. 180 tablet 3  . tiZANidine (ZANAFLEX) 4 MG tablet Take 4 mg by mouth. Reported on 03/02/2015    . traZODone (DESYREL) 50 MG tablet Take 50 mg by mouth at bedtime.    . valACYclovir (VALTREX) 500 MG tablet Take 500 mg by mouth 2 (two) times daily.      Marland Kitchen VICTOZA 18 MG/3ML SOPN INJECT 1.8 MG UNDER THE SKIN DAILY AT THE SAME TIME EVERY DAY 9 mL 3  . vitamin C (ASCORBIC ACID) 500 MG tablet Take 250 mg by mouth daily. Takes 2 a day     No current facility-administered medications for this visit.      Allergies:   Ephedrine; Aspirin; Crestor [rosuvastatin]; Erythromycin; Nsaids; and Oxycodone   Social History:  The patient  reports that she quit smoking about 22 years ago. Her smoking use included Cigarettes. She has a 1.25 pack-year smoking history. She has never used smokeless tobacco. She reports that she does not drink alcohol or use drugs.   Family History:   family history includes Alzheimer's disease in her father; Colitis in her mother; Colon cancer in her father; Coronary artery disease in her father and mother; Crohn's disease in her mother; Dementia in her father; Hypertension in her mother; Osteoporosis in her mother; Stroke in her cousin.    Review of Systems: Review of Systems  Constitutional: Negative.   Respiratory: Negative.   Cardiovascular: Negative.    Gastrointestinal: Negative.   Musculoskeletal: Negative.   Skin:       bruising  Neurological: Negative.   Psychiatric/Behavioral: Negative.   All other systems reviewed and are negative.    PHYSICAL EXAM: VS:  BP (!) 140/100  Pulse 77   Ht 5' 6"  (1.676 m)   Wt 231 lb (104.8 kg)   LMP 08/23/2006 (Approximate)   SpO2 97%   BMI 37.28 kg/m  , BMI Body mass index is 37.28 kg/m. GEN: Well nourished, well developed, in no acute distress,obese  HEENT: normal  Neck: no JVD, carotid bruits, or masses Cardiac: RRR; no murmurs, rubs, or gallops,no edema  Respiratory:  clear to auscultation bilaterally, normal work of breathing GI: soft, nontender, nondistended, + BS MS: no deformity or atrophy  Skin: warm and dry, no rash Neuro:  Strength and sensation are intact Psych: euthymic mood, full affect    Recent Labs: 02/07/2015: Hemoglobin 14.7; Platelets 484 06/19/2015: TSH 2.07 08/21/2015: ALT 63; BUN 13; Creatinine, Ser 0.79; Potassium 4.1; Sodium 137    Lipid Panel Lab Results  Component Value Date   CHOL 154 11/28/2014   HDL 39.40 11/28/2014   LDLCALC 65 10/13/2014   TRIG 235.0 (H) 11/28/2014      Wt Readings from Last 3 Encounters:  11/13/15 231 lb (104.8 kg)  09/29/15 226 lb (102.5 kg)  08/31/15 233 lb (105.7 kg)       ASSESSMENT AND PLAN:  Coronary artery disease involving native coronary artery of native heart without angina pectoris - Plan: EKG 12-Lead Currently with no symptoms of angina. No further workup at this time.  Recommended she decrease the dose of brilinta down to 60 mg twice a day with low-dose aspirin  Essential hypertension - Plan: EKG 12-Lead Blood pressure elevated on today's visit, improved on recheck Recommended she monitor blood pressure at home and call our office with more numbers. If it continues to run high we would recommend increasing her clonidine  HYPERCHOLESTEROLEMIA Cholesterol is at goal on the current lipid regimen. No changes  to the medications were made.  Type 2 diabetes mellitus with other circulatory complication, unspecified long term insulin use status (HCC) Stressed importance of aggressive weight loss, strict diet She is doing water aerobics for weight loss  Malignant neoplasm of overlapping sites of left female breast, unspecified estrogen receptor status ([place]) Followed by oncology   Total encounter time more than 25 minutes  Greater than 50% was spent in counseling and coordination of care with the patient   Disposition:   F/U  6 months   Orders Placed This Encounter  Procedures  . EKG 12-Lead     Signed, Esmond Plants, M.D., Ph.D. 11/13/2015  [place], [place]

## 2015-11-15 ENCOUNTER — Ambulatory Visit (INDEPENDENT_AMBULATORY_CARE_PROVIDER_SITE_OTHER): Payer: BLUE CROSS/BLUE SHIELD | Admitting: Endocrinology

## 2015-11-15 ENCOUNTER — Encounter: Payer: Self-pay | Admitting: Endocrinology

## 2015-11-15 VITALS — BP 128/82 | HR 76 | Temp 98.1°F | Resp 16 | Wt 228.0 lb

## 2015-11-15 DIAGNOSIS — E1165 Type 2 diabetes mellitus with hyperglycemia: Secondary | ICD-10-CM

## 2015-11-15 NOTE — Patient Instructions (Signed)
Check at bedtime

## 2015-11-15 NOTE — Progress Notes (Signed)
Patient ID: Lindsay Fry, female   DOB: [birth year], 55 y.o.   MRN: [id number]   Reason for Appointment:  Hypothyroidism and diabetes, followup visit  History of Present Illness:  DIABETES:  Prior history: With her high A1c of 6.7 in 03/2014 she had an abnormal glucose tolerance test indicating diabetes and 1 hour glucose of 300 She was started on metformin; however even with 1500 mg of metformin ER  blood sugars were not controlled especially fasting Subsequently with taking Janumet XR 100/1000 daily her blood sugars were much better Because of her weight gain and A1c going up to 7.3 along with occasional readings over 200 she was started on Victoza in 11/16 She has had progressive hyperglycemia previously and A1c was up to 8.6% in 4/17   Non-insulin hypoglycemic drugs: Victoza 1.8 mg daily, metformin ER 2000 mg daily, Invokana 300 mg daily INSULIN regimen: Levemir 32 units at bedtime  A1c is recently 7.3 compared to 7.1 in June  Current management, blood sugar patterns and problems identified:  She was started on Levemir insulin in 08/2015 because of progressive rise in blood sugars and inability to control with 3 other agents  She has increased the dose further since her last visit because of relatively high fasting readings.  Recent FASTING readings range from 129-164 with AVERAGE 145  Previous average in the morning was 200  She has also started doing water exercises but hasn't lost only 2 pounds since her last visit.  She has been trying to work with [employer]  Has been compliant with all medications including 300 mg of Invokana without side effects  She has not been checking her blood sugars nonfasting  GLUCOSE readings from review of One Touch Verio monitor as above:   Weight history:  Wt Readings from Last 3 Encounters:  11/15/15 228 lb (103.4 kg)  11/13/15 231 lb (104.8 kg)  09/29/15 226 lb (102.5 kg)   Previous consultation with dietitian:  none  Exercise regimen: aquatics  Dinner At [employer]  LABS:  Lab Results  Component Value Date   HGBA1C 7.3 09/29/2015   HGBA1C 7.1 (H) 06/19/2015   HGBA1C 8.6 (H) 04/25/2015   Lab Results  Component Value Date   MICROALBUR 13.3 (H) 11/10/2015   LDLCALC 65 10/13/2014   CREATININE 0.79 08/21/2015     HYPOTHYROIDISM: This was first diagnosed in 1992 after her treatment for Graves' disease with I-131 She has been on relatively large doses of thyroxine supplements She was on 224 mcg since 11/13 and her TSH was normal in 3/14 Subjectively difficult to assess her thyroid since she tends to have fatigue chronically.  In 2014 her dose was reduced to 200 mcg daily and her dose has been fluctuating since then She has generally required periodic increase in dosage including in 05/2014 when her TSH was 20  She is  on 2 tablets of 137 g daily, continues on the brand name Synthroid  Her symptoms are usually nonspecific for hypothyroidism, mostly related to other medical problems including sleep disturbances and bipolar illness.   She has been quite regular with the Synthroid in the mornings without any interfering substances         TSH is normal consistently:   Lab Results  Component Value Date   TSH 1.42 11/10/2015   TSH 2.07 06/19/2015   TSH 3.70 04/25/2015   FREET4 0.96 11/10/2015   FREET4 1.11 04/25/2015   FREET4 1.24 01/25/2015     Lab on 11/10/2015  Component Date Value Ref Range Status  . TSH 11/13/2015 1.42  0.35 - 4.50 uIU/mL Final  . Free T4 11/13/2015 0.96  0.60 - 1.60 ng/dL Final   Comment: Specimens from patients who are undergoing biotin therapy and /or ingesting biotin supplements may contain high levels of biotin.  The higher biotin concentration in these specimens interferes with this Free T4 assay.  Specimens that contain high levels  of biotin may cause false high results for this Free T4 assay.  Please interpret results in light of the total clinical  presentation of the patient.    . Color, Urine 11/10/2015 YELLOW  Yellow;Lt. Yellow Final  . APPearance 11/10/2015 CLEAR  Clear Final  . Specific Gravity, Urine 11/10/2015 1.015  1.000 - 1.030 Final  . pH 11/10/2015 5.5  5.0 - 8.0 Final  . Total Protein, Urine 11/10/2015 TRACE* Negative Final  . Urine Glucose 11/10/2015 >=1000* Negative Final  . Ketones, ur 11/10/2015 NEGATIVE  Negative Final  . Bilirubin Urine 11/10/2015 NEGATIVE  Negative Final  . Hgb urine dipstick 11/10/2015 NEGATIVE  Negative Final  . Urobilinogen, UA 11/10/2015 0.2  0.0 - 1.0 Final  . Leukocytes, UA 11/10/2015 NEGATIVE  Negative Final  . Nitrite 11/10/2015 NEGATIVE  Negative Final  . WBC, UA 11/10/2015 0-2/hpf  0-2/hpf Final  . RBC / HPF 11/10/2015 none seen  0-2/hpf Final  . Squamous Epithelial / LPF 11/10/2015 Rare(0-4/hpf)  Rare(0-4/hpf) Final  . Microalb, Ur 11/10/2015 13.3* 0.0 - 1.9 mg/dL Final  . Creatinine,U 11/10/2015 64.1  mg/dL Final  . Microalb Creat Ratio 11/10/2015 20.8  0.0 - 30.0 mg/g Final  . Cholesterol 11/10/2015 121  0 - 200 mg/dL Final  . Triglycerides 11/10/2015 217.0* 0.0 - 149.0 mg/dL Final  . HDL 11/10/2015 36.50* >39.00 mg/dL Final  . VLDL 11/10/2015 43.4* 0.0 - 40.0 mg/dL Final  . Total CHOL/HDL Ratio 11/10/2015 3   Final  . NonHDL 11/10/2015 84.91   Final  . Direct LDL 11/10/2015 67.0  mg/dL Final      Medication List       Accurate as of 11/15/15  8:42 PM. Always use your most recent med list.          aspirin 81 MG tablet Take 81 mg by mouth daily.   atorvastatin 80 MG tablet Commonly known as:  LIPITOR TAKE 1 TABLET(80 MG) BY MOUTH DAILY   b complex vitamins capsule Take 1 capsule by mouth daily.   chlorproMAZINE 25 MG tablet Commonly known as:  THORAZINE Take 25-50 mg by mouth as needed. Reported on 03/02/2015   cloNIDine 0.1 MG tablet Commonly known as:  CATAPRES Take 1 tablet (0.1 mg total) by mouth 2 (two) times daily.   co-enzyme Q-10 50 MG capsule Take  50 mg by mouth daily.   diazepam 10 MG tablet Commonly known as:  VALIUM Take 2.5-10 mg by mouth as needed for anxiety.   diltiazem 240 MG 24 hr capsule Commonly known as:  CARDIZEM CD Take 1 capsule (240 mg total) by mouth daily.   DRAMAMINE PO Take by mouth as needed.   fluticasone 50 MCG/ACT nasal spray Commonly known as:  FLONASE USE 2 SPRAYS IN EACH NOSTRIL TWICE DAILY   gabapentin 300 MG capsule Commonly known as:  NEURONTIN TAKE 1 CAPSULE(300 MG) BY MOUTH THREE TIMES DAILY   GAS-X PO Take 1 tablet by mouth 4 (four) times daily.   Insulin Detemir 100 UNIT/ML Pen Commonly known as:  LEVEMIR Inject 12 Units into the skin  daily at 10 pm.   Insulin Pen Needle 31G X 5 MM Misc Use once a day with Victoza pen   INVOKANA 300 MG Tabs tablet Generic drug:  canagliflozin TAKE 1 TABLET(300 MG) BY MOUTH DAILY BEFORE BREAKFAST   lamoTRIgine 200 MG tablet Commonly known as:  LAMICTAL Take 400 mg by mouth daily.   latanoprost 0.005 % ophthalmic solution Commonly known as:  XALATAN Place 1 drop into both eyes At bedtime.   letrozole 2.5 MG tablet Commonly known as:  FEMARA Take 1 tablet (2.5 mg total) by mouth daily.   LINZESS 290 MCG Caps capsule Generic drug:  linaclotide TAKE 1 CAPSULE(290 MCG) BY MOUTH DAILY   mesalamine 1.2 g EC tablet Commonly known as:  LIALDA Take 1 tablet (1.2 g total) by mouth daily with breakfast.   metFORMIN 500 MG 24 hr tablet Commonly known as:  GLUCOPHAGE-XR TAKE 4 TABLETS BY MOUTH DAILY   multivitamin tablet Take 1 tablet by mouth daily.   nebivolol 10 MG tablet Commonly known as:  BYSTOLIC Take 1 tablet (10 mg total) by mouth 3 (three) times daily. Takes 1 tablet in the morning and 2 tablets after lunch   nitrofurantoin 100 MG capsule Commonly known as:  MACRODANTIN Take 100 mg by mouth as directed. Reported on 03/02/2015   Omega-3 Krill Oil 500 MG Caps Take 500 mg by mouth.   ondansetron 4 MG tablet Commonly known as:   ZOFRAN Take 1 tablet (4 mg total) by mouth every 8 (eight) hours as needed for nausea or vomiting.   ONETOUCH VERIO test strip Generic drug:  glucose blood USE AS DIRECTED TO CHECK BLOOD SUGAR TWICE DAILY   pantoprazole 40 MG tablet Commonly known as:  PROTONIX TAKE 1 TABLET BY MOUTH TWICE DAILY   PROBIOTIC DAILY PO Take by mouth.   promethazine 25 MG tablet Commonly known as:  PHENERGAN Take 1 tablet (25 mg total) by mouth every 6 (six) hours as needed for nausea or vomiting.   ranitidine 300 MG tablet Commonly known as:  ZANTAC Take 0.5 tablets (150 mg total) by mouth 2 (two) times daily.   SALONPAS PAIN RELIEF PATCH EX Apply topically as needed. Reported on 03/02/2015   SAPHRIS 10 MG Subl Generic drug:  Asenapine Maleate Place 20 mg under the tongue at bedtime.   senna-docusate 8.6-50 MG tablet Commonly known as:  Senokot-S Take 3 tablets by mouth daily.   SYNTHROID 137 MCG tablet Generic drug:  levothyroxine TAKE 2 TABLETS BY MOUTH DAILY BEFORE BREAKFAST.   ticagrelor 60 MG Tabs tablet Commonly known as:  BRILINTA Take 1 tablet (60 mg total) by mouth 2 (two) times daily.   tiZANidine 4 MG tablet Commonly known as:  ZANAFLEX Take 4 mg by mouth. Reported on 03/02/2015   traZODone 50 MG tablet Commonly known as:  DESYREL Take 50 mg by mouth at bedtime.   valACYclovir 500 MG tablet Commonly known as:  VALTREX Take 500 mg by mouth 2 (two) times daily.   VICTOZA 18 MG/3ML Sopn Generic drug:  liraglutide INJECT 1.8 MG UNDER THE SKIN DAILY AT THE SAME TIME EVERY DAY   vitamin C 500 MG tablet Commonly known as:  ASCORBIC ACID Take 250 mg by mouth daily. Takes 2 a day   Vitamin D3 2000 units Tabs Take 1 tablet by mouth daily.       Allergies:  Allergies  Allergen Reactions  . Ephedrine Other (See Comments)    Pt becomes hyper  . Aspirin  REACTION: aggrivates colitis  . Crestor [Rosuvastatin]     REACTION: increased lfts  . Erythromycin      REACTION: GI upset  . Nsaids     REACTION: aggrivate colitis  . Oxycodone Other (See Comments)    Panic Attack    Past Medical History:  Diagnosis Date  . ADD (attention deficit disorder) 11/19/2012  . Allergic rhinitis, cause unspecified   . Anxiety state, unspecified   . Arthritis   . Asymptomatic postmenopausal status (age-related) (natural)   . Atypical hyperplasia of left breast 2000  . Benign neoplasm of other and unspecified site of the digestive system   . Bipolar disorder ([place])   . Breast cancer ([place])    Left Breast 49mm invasive CA left uiq, dcis left uoq and a lot ADH, ALH in both breasts.  Marland Kitchen CAD (coronary artery disease)    a. 08/2014 NSTEMI/Cath: LM nl, LAD 50p, D1/D2 min irregs, LCX nl, OM1 40, LPDA nl, RI 99ost (2.68mm vessel->med Rx), RCA nl, AM 60, nl EF.  . Carcinoma of left breast ([place]) 06/02/2014  . Chronic diastolic CHF (congestive heart failure) ([place])    a. 08/2014 Echo: EF 60-65%, Gr 1 DD, nl LA.  Marland Kitchen Cyst    on Achilles tendon  . Depression   . Diabetes mellitus ([place])    frank  . Dysuria   . Edema   . Family history of malignant neoplasm of gastrointestinal tract   . Fibromyalgia   . GERD (gastroesophageal reflux disease)   . Glaucoma    pre glaucoma  . Headache    migraines  . Hiatal hernia   . History of alcoholism ([place])   . History of migraines   . History of ovarian cyst   . Insomnia, unspecified   . Myocardial infarction 09-04-14  . OCD (obsessive compulsive disorder)   . Other screening mammogram   . Pure hypercholesterolemia   . Rosacea   . Subacute confusional state 11/19/2012  . Tubular adenoma of colon 01/10/11  . Ulcerative colitis, unspecified   . Unspecified asthma(493.90)   . Unspecified essential hypertension   . Unspecified hypothyroidism   . Unspecified vitamin D deficiency   . Vertigo     Past Surgical History:  Procedure Laterality Date  . APPENDECTOMY    . AXILLARY SENTINEL NODE BIOPSY Left 05/23/2014   Procedure: AXILLARY  SENTINEL NODE BIOPSY;  Surgeon: Christene Lye, MD;  Location: [facility]S;  Service: General;  Laterality: Left;  . BREAST BIOPSY  Aug 2000   on tamoxifen, atypical hyperplasia  . BREAST SURGERY Left Aug 2000   lumpectomy/ Dr Sharlet Salina  . BREAST SURGERY Bilateral 05/23/14   Mastectomy  . BUNIONECTOMY Right   . CARDIAC CATHETERIZATION N/A 09/05/2014   Procedure: Left Heart Cath and Coronary Angiography;  Surgeon: Wellington Hampshire, MD;  Location: [facility];  Service: Cardiovascular;  Laterality: N/A;  . CARDIAC CATHETERIZATION  09-05-14  . CESAREAN SECTION  1996/1997   placenta previa, gest DM, pre-eclampsia  . cholecystect  1999  . CHOLECYSTECTOMY  1997   adhesions also  . COLONOSCOPY  01/2001   Ulcerative colitis  . COLONOSCOPY  04/2004   UC, polyp  . COLONOSCOPY  12/08   UC, no polyps  . DEXA  04/1999 and 2010   normal  . ESOPHAGOGASTRODUODENOSCOPY  09/2001   polyp  . exercise stress test  11/2003   negative  . FEMUR FRACTURE SURGERY Left   . MASTECTOMY Bilateral 05-23-14   Dr.  Sankar  . MECKEL DIVERTICULUM EXCISION  1999  . NASAL SINUS SURGERY  05/1997  . nuclear stress test  06/2007   negative  . precancerous mole removed    . SIMPLE MASTECTOMY WITH AXILLARY SENTINEL NODE BIOPSY Bilateral 05/23/2014   Procedure: Bilateral simple mastectomy, left sentinel node biopsy ;  Surgeon: Christene Lye, MD;  Location: [facility];  Service: General;  Laterality: Bilateral;  . Sleep study  11/09   no apnea, but did snore (done by [facility])  . TONSILLECTOMY  1984  . TUBAL LIGATION    . WISDOM TOOTH EXTRACTION  1980's    Family History  Problem Relation Age of Onset  . Coronary artery disease Father   . Colon cancer Father   . Alzheimer's disease Father   . Dementia Father   . Coronary artery disease Mother   . Hypertension Mother   . Osteoporosis Mother   . Colitis Mother   . Crohn's disease Mother   . Breast cancer      great aunts  . Coronary artery disease        Uncle (also AAA)  . Diabetes      remote family history  . Stroke Cousin   . Esophageal cancer Neg Hx   . Rectal cancer Neg Hx   . Stomach cancer Neg Hx     Social History:  reports that she quit smoking about 22 years ago. Her smoking use included Cigarettes. She has a 1.25 pack-year smoking history. She has never used smokeless tobacco. She reports that she does not drink alcohol or use drugs.  REVIEW Of SYSTEMS:   HYPERCALCEMIA: Her calcium has been Previously high, now normal  Calcium supplements had been stopped She is taking OTC vitamin D3, 2000 units  Previous level is 22  Lab Results  Component Value Date   CALCIUM 9.4 08/21/2015   PHOS 2.3 01/29/2007     HYPERTENSION: Blood pressure is controlled, managed by cardiologist.   Currently on low-dose clonidine, Bystolic twice a day and diltiazem    Lab Results  Component Value Date   CREATININE 0.79 08/21/2015   BUN 13 08/21/2015   NA 137 08/21/2015   K 4.1 08/21/2015   CL 103 08/21/2015   CO2 26 08/21/2015    History of hypercholesterolemia: Previously on 20 mg Lipitor now 80 mg since her MI  Her cardiologist feels that ALT is high from fatty liver, this was 72 and 1/17  Lab Results  Component Value Date   CHOL 121 11/10/2015   HDL 36.50 (L) 11/10/2015   LDLCALC 65 10/13/2014   LDLDIRECT 67.0 11/10/2015   TRIG 217.0 (H) 11/10/2015   CHOLHDL 3 11/10/2015   Lab Results  Component Value Date   ALT 63 (H) 08/21/2015      Examination:   BP 128/82 (BP Location: Right Arm, Patient Position: Sitting, Cuff Size: Large)   Pulse 76   Temp 98.1 F (36.7 C) (Oral)   Resp 16   Wt 228 lb (103.4 kg)   LMP 08/23/2006 (Approximate)   SpO2 96%   BMI 36.80 kg/m    She looks cushingoid     Assessments   DIABETES:  See history of present illness for detailed discussion of his current management, blood sugar patterns and problems identified  Her blood sugars are gradually improving Last A1c  7.3 Currently her insulin doses as gone up to 32 units with fair control of morning readings However has not done any readings after meals  She is doing quite well with exercise regimen now and also starting to work on the diet  Recommendations today:  No change in Basal insulin with 32 units unless monitoring readings are consistently over 140  Start checking a few readings at bedtime or in the afternoon  She will continue metformin, Invokana and Victoza also  Hypothyroidism, post ablative: TSH back to normal and will continue same dose   Patient Instructions  Check at bedtime   .  Lindsay Fry 11/15/2015, 8:42 PM   Note: This office note was prepared with Dragon voice recognition system technology. Any transcriptional errors that result from this process are unintentional.

## 2015-11-27 ENCOUNTER — Telehealth: Payer: Self-pay | Admitting: Endocrinology

## 2015-11-27 ENCOUNTER — Other Ambulatory Visit: Payer: Self-pay | Admitting: Endocrinology

## 2015-11-27 MED ORDER — INSULIN DETEMIR 100 UNIT/ML FLEXPEN: 32.0000 [IU] | PEN_INJECTOR | Freq: Every day | SUBCUTANEOUS | 2 refills | Status: DC

## 2015-11-27 NOTE — Telephone Encounter (Signed)
Pt called and wanted to make sure that [facility] is aware that her Levemir is now 32 units daily and needles are used 2x daily.

## 2015-11-28 ENCOUNTER — Encounter: Payer: Self-pay | Admitting: Endocrinology

## 2015-11-28 ENCOUNTER — Ambulatory Visit (INDEPENDENT_AMBULATORY_CARE_PROVIDER_SITE_OTHER): Payer: BLUE CROSS/BLUE SHIELD | Admitting: General Surgery

## 2015-11-28 ENCOUNTER — Other Ambulatory Visit: Payer: Self-pay | Admitting: Cardiovascular Disease

## 2015-11-28 ENCOUNTER — Encounter: Payer: Self-pay | Admitting: General Surgery

## 2015-11-28 VITALS — BP 130/82 | HR 82 | Resp 12 | Ht 66.0 in | Wt 226.0 lb

## 2015-11-28 DIAGNOSIS — C50912 Malignant neoplasm of unspecified site of left female breast: Secondary | ICD-10-CM

## 2015-11-28 DIAGNOSIS — D0512 Intraductal carcinoma in situ of left breast: Secondary | ICD-10-CM

## 2015-11-28 DIAGNOSIS — D0582 Other specified type of carcinoma in situ of left breast: Secondary | ICD-10-CM | POA: Diagnosis not present

## 2015-11-28 NOTE — Patient Instructions (Signed)
The patient is aware to call back for any questions or concerns.  

## 2015-11-28 NOTE — Progress Notes (Signed)
Patient ID: Lindsay Fry, female   DOB: Feb 13, 1960, 55 y.o.   MRN: [id number]  Chief Complaint  Patient presents with  . Follow-up    HPI Lindsay Fry is a 55 y.o. female.  Here today for 6 month follow up breast cancer-s/p bilateral mastectomy. She is doing water aerobics and trying to reduce her weight. She has lost 20 pounds so far. She reports no new problems. I have reviewed the history of present illness with the patient.  HPI  Past Medical History:  Diagnosis Date  . ADD (attention deficit disorder) 11/19/2012  . Allergic rhinitis, cause unspecified   . Anxiety state, unspecified   . Arthritis   . Asymptomatic postmenopausal status (age-related) (natural)   . Atypical hyperplasia of left breast 2000  . Benign neoplasm of other and unspecified site of the digestive system   . Bipolar disorder ([place])   . Breast cancer ([place])    Left Breast 19mm invasive CA left uiq, dcis left uoq and a lot ADH, ALH in both breasts.  Marland Kitchen CAD (coronary artery disease)    a. 08/2014 NSTEMI/Cath: LM nl, LAD 50p, D1/D2 min irregs, LCX nl, OM1 40, LPDA nl, RI 99ost (2.48mm vessel->med Rx), RCA nl, AM 60, nl EF.  . Carcinoma of left breast ([place]) 06/02/2014  . Chronic diastolic CHF (congestive heart failure) ([place])    a. 08/2014 Echo: EF 60-65%, Gr 1 DD, nl LA.  Marland Kitchen Cyst    on Achilles tendon  . Depression   . Diabetes mellitus ([place])    frank  . Dysuria   . Edema   . Family history of malignant neoplasm of gastrointestinal tract   . Fibromyalgia   . GERD (gastroesophageal reflux disease)   . Glaucoma    pre glaucoma  . Headache    migraines  . Hiatal hernia   . History of alcoholism ([place])   . History of migraines   . History of ovarian cyst   . Insomnia, unspecified   . Myocardial infarction 09-04-14  . OCD (obsessive compulsive disorder)   . Other screening mammogram   . Pure hypercholesterolemia   . Rosacea   . Subacute confusional state 11/19/2012  . Tubular adenoma of colon 01/10/11   . Ulcerative colitis, unspecified   . Unspecified asthma(493.90)   . Unspecified essential hypertension   . Unspecified hypothyroidism   . Unspecified vitamin D deficiency   . Vertigo     Past Surgical History:  Procedure Laterality Date  . APPENDECTOMY    . AXILLARY SENTINEL NODE BIOPSY Left 05/23/2014   Procedure: AXILLARY SENTINEL NODE BIOPSY;  Surgeon: Christene Lye, MD;  Location: [facility];  Service: General;  Laterality: Left;  . BREAST BIOPSY  Aug 2000   on tamoxifen, atypical hyperplasia  . BREAST SURGERY Left Aug 2000   lumpectomy/ Dr Sharlet Salina  . BREAST SURGERY Bilateral 05/23/14   Mastectomy  . BUNIONECTOMY Right   . CARDIAC CATHETERIZATION N/A 09/05/2014   Procedure: Left Heart Cath and Coronary Angiography;  Surgeon: Wellington Hampshire, MD;  Location: [facility];  Service: Cardiovascular;  Laterality: N/A;  . CARDIAC CATHETERIZATION  09-05-14  . CESAREAN SECTION  1996/1997   placenta previa, gest DM, pre-eclampsia  . cholecystect  1999  . CHOLECYSTECTOMY  1997   adhesions also  . COLONOSCOPY  01/2001   Ulcerative colitis  . COLONOSCOPY  04/2004   UC, polyp  . COLONOSCOPY  12/08   UC, no polyps  . DEXA  04/1999  and 2010   normal  . ESOPHAGOGASTRODUODENOSCOPY  09/2001   polyp  . exercise stress test  11/2003   negative  . FEMUR FRACTURE SURGERY Left   . MASTECTOMY Bilateral 05-23-14   Dr. Jamal Collin  . MECKEL DIVERTICULUM EXCISION  1999  . NASAL SINUS SURGERY  05/1997  . nuclear stress test  06/2007   negative  . precancerous mole removed    . SIMPLE MASTECTOMY WITH AXILLARY SENTINEL NODE BIOPSY Bilateral 05/23/2014   Procedure: Bilateral simple mastectomy, left sentinel node biopsy ;  Surgeon: Christene Lye, MD;  Location: [facility];  Service: General;  Laterality: Bilateral;  . Sleep study  11/09   no apnea, but did snore (done by [facility])  . TONSILLECTOMY  1984  . TUBAL LIGATION    . WISDOM TOOTH EXTRACTION  1980's    Family History   Problem Relation Age of Onset  . Coronary artery disease Father   . Colon cancer Father   . Alzheimer's disease Father   . Dementia Father   . Coronary artery disease Mother   . Hypertension Mother   . Osteoporosis Mother   . Colitis Mother   . Crohn's disease Mother   . Breast cancer      great aunts  . Coronary artery disease      Uncle (also AAA)  . Diabetes      remote family history  . Stroke Cousin   . Esophageal cancer Neg Hx   . Rectal cancer Neg Hx   . Stomach cancer Neg Hx     Social History Social History  Substance Use Topics  . Smoking status: Former Smoker    Packs/day: 0.25    Years: 5.00    Types: Cigarettes    Quit date: 01/14/1993  . Smokeless tobacco: Never Used  . Alcohol use No     Comment: Recovered ETOH    Allergies  Allergen Reactions  . Ephedrine Other (See Comments)    Pt becomes hyper  . Aspirin     REACTION: aggrivates colitis  . Crestor [Rosuvastatin]     REACTION: increased lfts  . Erythromycin     REACTION: GI upset  . Nsaids     REACTION: aggrivate colitis  . Oxycodone Other (See Comments)    Panic Attack    Current Outpatient Prescriptions  Medication Sig Dispense Refill  . Asenapine Maleate (SAPHRIS) 10 MG SUBL Place 20 mg under the tongue at bedtime.    Marland Kitchen aspirin 81 MG tablet Take 81 mg by mouth daily.    Marland Kitchen atorvastatin (LIPITOR) 80 MG tablet TAKE 1 TABLET(80 MG) BY MOUTH DAILY 30 tablet 3  . b complex vitamins capsule Take 1 capsule by mouth daily.    . B-D UF III MINI PEN NEEDLES 31G X 5 MM MISC USE ONCE A DAY WITH VICTOZA PEN 100 each 0  . chlorproMAZINE (THORAZINE) 25 MG tablet Take 25-50 mg by mouth as needed. Reported on 03/02/2015    . Cholecalciferol (VITAMIN D3) 2000 UNITS TABS Take 1 tablet by mouth daily.    . cloNIDine (CATAPRES) 0.1 MG tablet Take 1 tablet (0.1 mg total) by mouth 2 (two) times daily. 180 tablet 3  . co-enzyme Q-10 50 MG capsule Take 50 mg by mouth daily.    . diazepam (VALIUM) 10 MG tablet  Take 2.5-10 mg by mouth as needed for anxiety.    Marland Kitchen diltiazem (CARDIZEM CD) 240 MG 24 hr capsule Take 1 capsule (240 mg total) by  mouth daily. 90 capsule 3  . DimenhyDRINATE (DRAMAMINE PO) Take by mouth as needed.    . fluticasone (FLONASE) 50 MCG/ACT nasal spray USE 2 SPRAYS IN EACH NOSTRIL TWICE DAILY 16 g 5  . gabapentin (NEURONTIN) 300 MG capsule TAKE 1 CAPSULE(300 MG) BY MOUTH THREE TIMES DAILY 90 capsule 11  . Insulin Detemir (LEVEMIR) 100 UNIT/ML Pen Inject 32 Units into the skin daily at 10 pm. 15 mL 2  . INVOKANA 300 MG TABS tablet TAKE 1 TABLET(300 MG) BY MOUTH DAILY BEFORE BREAKFAST 30 tablet 5  . lamoTRIgine (LAMICTAL) 200 MG tablet Take 400 mg by mouth daily.    Marland Kitchen latanoprost (XALATAN) 0.005 % ophthalmic solution Place 1 drop into both eyes At bedtime.    Marland Kitchen letrozole (FEMARA) 2.5 MG tablet Take 1 tablet (2.5 mg total) by mouth daily. 30 tablet 6  . Liniments (SALONPAS PAIN RELIEF PATCH EX) Apply topically as needed. Reported on 03/02/2015    . LINZESS 290 MCG CAPS capsule TAKE 1 CAPSULE(290 MCG) BY MOUTH DAILY 30 capsule 0  . mesalamine (LIALDA) 1.2 g EC tablet Take 1 tablet (1.2 g total) by mouth daily with breakfast. 30 tablet 6  . metFORMIN (GLUCOPHAGE-XR) 500 MG 24 hr tablet TAKE 4 TABLETS BY MOUTH DAILY 120 tablet 3  . Multiple Vitamin (MULTIVITAMIN) tablet Take 1 tablet by mouth daily.      . nebivolol (BYSTOLIC) 10 MG tablet Take 1 tablet (10 mg total) by mouth 3 (three) times daily. Takes 1 tablet in the morning and 2 tablets after lunch 270 tablet 3  . nitrofurantoin (MACRODANTIN) 100 MG capsule Take 100 mg by mouth as directed. Reported on 03/02/2015    . Omega-3 Krill Oil 500 MG CAPS Take 500 mg by mouth.    . ondansetron (ZOFRAN) 4 MG tablet Take 1 tablet (4 mg total) by mouth every 8 (eight) hours as needed for nausea or vomiting. 30 tablet 1  . ONETOUCH VERIO test strip USE AS DIRECTED TO CHECK BLOOD SUGAR TWICE DAILY 100 each 5  . pantoprazole (PROTONIX) 40 MG tablet  TAKE 1 TABLET BY MOUTH TWICE DAILY 60 tablet 3  . Probiotic Product (PROBIOTIC DAILY PO) Take by mouth.    . ranitidine (ZANTAC) 300 MG tablet Take 0.5 tablets (150 mg total) by mouth 2 (two) times daily. 60 tablet 3  . Simethicone (GAS-X PO) Take 1 tablet by mouth 4 (four) times daily.    Marland Kitchen SYNTHROID 137 MCG tablet TAKE 2 TABLETS BY MOUTH DAILY BEFORE BREAKFAST. 60 tablet 3  . ticagrelor (BRILINTA) 60 MG TABS tablet Take 1 tablet (60 mg total) by mouth 2 (two) times daily. 180 tablet 3  . tiZANidine (ZANAFLEX) 4 MG tablet Take 4 mg by mouth. Reported on 03/02/2015    . traZODone (DESYREL) 50 MG tablet Take 50 mg by mouth at bedtime.    . valACYclovir (VALTREX) 500 MG tablet Take 500 mg by mouth 2 (two) times daily.      Marland Kitchen VICTOZA 18 MG/3ML SOPN INJECT 1.8 MG UNDER THE SKIN DAILY AT THE SAME TIME EVERY DAY 9 mL 3  . vitamin C (ASCORBIC ACID) 500 MG tablet Take 250 mg by mouth daily. Takes 2 a day     No current facility-administered medications for this visit.     Review of Systems Review of Systems  Constitutional: Negative.   Respiratory: Negative.   Cardiovascular: Negative.     Blood pressure 130/82, pulse 82, resp. rate 12, height 5\' 6"  (1.676  m), weight 226 lb (102.5 kg), last menstrual period 08/23/2006.  Physical Exam Physical Exam  Constitutional: She is oriented to person, place, and time. She appears well-developed and well-nourished.  HENT:  Mouth/Throat: Oropharynx is clear and moist.  Eyes: Conjunctivae are normal. No scleral icterus.  Neck: Neck supple.  Cardiovascular: Normal rate, regular rhythm and normal heart sounds.   Pulmonary/Chest: Effort normal and breath sounds normal.  Both mastecomy incisions are well healed. No signs of local recurrence  Abdominal: Soft. There is no tenderness.  Lymphadenopathy:    She has no cervical adenopathy.    She has no axillary adenopathy.  Neurological: She is alert and oriented to person, place, and time.  Skin: Skin is  warm and dry.  Psychiatric: Her behavior is normal.    Data Reviewed Prior notes  Assessment    Left breast CA, ER/PR pos, her 2 neg. S/P bilateral mastectomy. Now on Letrazole and doing well.     Plan    Follow up in 6 months. The patient is aware to call back for any questions or concerns.       This information has been scribed by Karie Fetch RN, BSN,BC.   Tawna Alwin G 11/29/2015, 9:14 AM

## 2015-11-29 ENCOUNTER — Other Ambulatory Visit: Payer: Self-pay

## 2015-11-29 ENCOUNTER — Encounter: Payer: Self-pay | Admitting: General Surgery

## 2015-11-29 MED ORDER — INSULIN PEN NEEDLE 31G X 5 MM MISC: 2 refills | Status: DC

## 2015-12-09 ENCOUNTER — Other Ambulatory Visit: Payer: Self-pay | Admitting: Cardiovascular Disease

## 2015-12-18 DIAGNOSIS — F411 Generalized anxiety disorder: Secondary | ICD-10-CM | POA: Diagnosis not present

## 2015-12-18 DIAGNOSIS — F3174 Bipolar disorder, in full remission, most recent episode manic: Secondary | ICD-10-CM | POA: Diagnosis not present

## 2015-12-18 DIAGNOSIS — F3176 Bipolar disorder, in full remission, most recent episode depressed: Secondary | ICD-10-CM | POA: Diagnosis not present

## 2015-12-20 DIAGNOSIS — F3131 Bipolar disorder, current episode depressed, mild: Secondary | ICD-10-CM | POA: Diagnosis not present

## 2016-01-09 ENCOUNTER — Other Ambulatory Visit: Payer: Self-pay | Admitting: Endocrinology

## 2016-01-11 ENCOUNTER — Other Ambulatory Visit: Payer: Self-pay | Admitting: Internal Medicine

## 2016-01-18 ENCOUNTER — Telehealth: Payer: Self-pay | Admitting: Cardiovascular Disease

## 2016-01-18 NOTE — Telephone Encounter (Signed)
Spoke w/ pt.  She reports that palpitations are increasing and she would like to wear a monitor.  She states that Dr. Rockey Situ mentioned a 14-day and 30-day monitor to her and that she would like to know which one he prefers. Advised her that I will call her back w/ his recommendation.

## 2016-01-18 NOTE — Telephone Encounter (Signed)
Would set up for 14 day monitor, zio

## 2016-01-18 NOTE — Telephone Encounter (Signed)
Left message for pt to call back  °

## 2016-01-18 NOTE — Telephone Encounter (Signed)
Pt would like to proceed with the monitor her and Dr. Rockey Situ discussed at her last visit. Please call.

## 2016-01-19 ENCOUNTER — Encounter: Payer: Self-pay | Admitting: Endocrinology

## 2016-01-19 NOTE — Telephone Encounter (Signed)
Can you set her up an appt to come in for this?

## 2016-01-23 ENCOUNTER — Other Ambulatory Visit: Payer: Self-pay | Admitting: Family Medicine

## 2016-01-24 ENCOUNTER — Encounter: Payer: Self-pay | Admitting: Family Medicine

## 2016-01-24 NOTE — Telephone Encounter (Signed)
Please schedule a spring f/u and refill until then

## 2016-01-24 NOTE — Telephone Encounter (Signed)
Pt scheduled appt and med refilled  

## 2016-01-24 NOTE — Telephone Encounter (Signed)
Left voicemail letting pt know she needs to schedule a f/u appt in Spring before we can fill med

## 2016-01-24 NOTE — Telephone Encounter (Signed)
Rx is due for a refill but no recent/future appts., please advise

## 2016-01-29 ENCOUNTER — Other Ambulatory Visit (INDEPENDENT_AMBULATORY_CARE_PROVIDER_SITE_OTHER): Payer: BLUE CROSS/BLUE SHIELD

## 2016-01-29 ENCOUNTER — Other Ambulatory Visit: Payer: Self-pay | Admitting: *Deleted

## 2016-01-29 DIAGNOSIS — R002 Palpitations: Secondary | ICD-10-CM

## 2016-02-05 ENCOUNTER — Telehealth: Payer: Self-pay | Admitting: General Surgery

## 2016-02-05 NOTE — Telephone Encounter (Signed)
PATIENT CALLED & WOULD LIKE DR Jamal Collin TO FAX A PRESCRIPTION FOR MASTECTOMY BRAS TO [facility](FAX#[phone])PRINTED OUT COPY OF THIS MESSAGE & PUT ON DR [facility] DESK./MTH

## 2016-02-06 NOTE — Telephone Encounter (Signed)
RX faxed to [facility] as requested.

## 2016-02-08 ENCOUNTER — Other Ambulatory Visit (INDEPENDENT_AMBULATORY_CARE_PROVIDER_SITE_OTHER): Payer: BLUE CROSS/BLUE SHIELD

## 2016-02-08 DIAGNOSIS — E1165 Type 2 diabetes mellitus with hyperglycemia: Secondary | ICD-10-CM

## 2016-02-08 LAB — COMPREHENSIVE METABOLIC PANEL
ALT: 57 U/L — ABNORMAL HIGH (ref 0–35)
AST: 39 U/L — ABNORMAL HIGH (ref 0–37)
Albumin: 4.7 g/dL (ref 3.5–5.2)
Alkaline Phosphatase: 121 U/L — ABNORMAL HIGH (ref 39–117)
BUN: 16 mg/dL (ref 6–23)
CO2: 28 mEq/L (ref 19–32)
Calcium: 10.3 mg/dL (ref 8.4–10.5)
Chloride: 103 mEq/L (ref 96–112)
Creatinine, Ser: 0.77 mg/dL (ref 0.40–1.20)
GFR: 81.96 mL/min (ref >60.00)
Glucose, Bld: 128 mg/dL — ABNORMAL HIGH (ref 70–99)
Potassium: 4.7 mEq/L (ref 3.5–5.1)
Sodium: 141 mEq/L (ref 135–145)
Total Bilirubin: 0.5 mg/dL (ref 0.2–1.2)
Total Protein: 7.7 g/dL (ref 6.0–8.3)

## 2016-02-08 LAB — HEMOGLOBIN A1C: Hgb A1c MFr Bld: 6.6 % — ABNORMAL HIGH (ref 4.6–6.5)

## 2016-02-15 ENCOUNTER — Ambulatory Visit: Payer: BLUE CROSS/BLUE SHIELD | Admitting: Endocrinology

## 2016-02-16 DIAGNOSIS — R002 Palpitations: Secondary | ICD-10-CM | POA: Diagnosis not present

## 2016-02-19 ENCOUNTER — Other Ambulatory Visit: Payer: Self-pay | Admitting: Endocrinology

## 2016-02-20 ENCOUNTER — Other Ambulatory Visit: Payer: Self-pay | Admitting: Internal Medicine

## 2016-02-21 ENCOUNTER — Inpatient Hospital Stay: Payer: BLUE CROSS/BLUE SHIELD

## 2016-02-21 ENCOUNTER — Inpatient Hospital Stay: Payer: BLUE CROSS/BLUE SHIELD | Attending: Internal Medicine | Admitting: Internal Medicine

## 2016-02-21 DIAGNOSIS — E559 Vitamin D deficiency, unspecified: Secondary | ICD-10-CM | POA: Diagnosis not present

## 2016-02-21 DIAGNOSIS — E7800 Pure hypercholesterolemia, unspecified: Secondary | ICD-10-CM | POA: Insufficient documentation

## 2016-02-21 DIAGNOSIS — M1990 Unspecified osteoarthritis, unspecified site: Secondary | ICD-10-CM | POA: Insufficient documentation

## 2016-02-21 DIAGNOSIS — H409 Unspecified glaucoma: Secondary | ICD-10-CM | POA: Insufficient documentation

## 2016-02-21 DIAGNOSIS — D0511 Intraductal carcinoma in situ of right breast: Secondary | ICD-10-CM | POA: Diagnosis not present

## 2016-02-21 DIAGNOSIS — Z9013 Acquired absence of bilateral breasts and nipples: Secondary | ICD-10-CM | POA: Insufficient documentation

## 2016-02-21 DIAGNOSIS — J45909 Unspecified asthma, uncomplicated: Secondary | ICD-10-CM | POA: Diagnosis not present

## 2016-02-21 DIAGNOSIS — I5032 Chronic diastolic (congestive) heart failure: Secondary | ICD-10-CM | POA: Diagnosis not present

## 2016-02-21 DIAGNOSIS — I2510 Atherosclerotic heart disease of native coronary artery without angina pectoris: Secondary | ICD-10-CM | POA: Diagnosis not present

## 2016-02-21 DIAGNOSIS — N951 Menopausal and female climacteric states: Secondary | ICD-10-CM | POA: Diagnosis not present

## 2016-02-21 DIAGNOSIS — I252 Old myocardial infarction: Secondary | ICD-10-CM | POA: Insufficient documentation

## 2016-02-21 DIAGNOSIS — C50812 Malignant neoplasm of overlapping sites of left female breast: Secondary | ICD-10-CM | POA: Diagnosis not present

## 2016-02-21 DIAGNOSIS — Z7984 Long term (current) use of oral hypoglycemic drugs: Secondary | ICD-10-CM | POA: Insufficient documentation

## 2016-02-21 DIAGNOSIS — K5190 Ulcerative colitis, unspecified, without complications: Secondary | ICD-10-CM | POA: Diagnosis not present

## 2016-02-21 DIAGNOSIS — M797 Fibromyalgia: Secondary | ICD-10-CM | POA: Diagnosis not present

## 2016-02-21 DIAGNOSIS — I110 Hypertensive heart disease with heart failure: Secondary | ICD-10-CM | POA: Insufficient documentation

## 2016-02-21 DIAGNOSIS — F429 Obsessive-compulsive disorder, unspecified: Secondary | ICD-10-CM | POA: Diagnosis not present

## 2016-02-21 DIAGNOSIS — Z79811 Long term (current) use of aromatase inhibitors: Secondary | ICD-10-CM | POA: Insufficient documentation

## 2016-02-21 DIAGNOSIS — Z7982 Long term (current) use of aspirin: Secondary | ICD-10-CM | POA: Insufficient documentation

## 2016-02-21 DIAGNOSIS — Z170 Estrogen receptor positive status [ER+]: Secondary | ICD-10-CM | POA: Diagnosis not present

## 2016-02-21 DIAGNOSIS — K219 Gastro-esophageal reflux disease without esophagitis: Secondary | ICD-10-CM | POA: Diagnosis not present

## 2016-02-21 DIAGNOSIS — Z79899 Other long term (current) drug therapy: Secondary | ICD-10-CM | POA: Diagnosis not present

## 2016-02-21 DIAGNOSIS — Z794 Long term (current) use of insulin: Secondary | ICD-10-CM | POA: Insufficient documentation

## 2016-02-21 DIAGNOSIS — K449 Diaphragmatic hernia without obstruction or gangrene: Secondary | ICD-10-CM | POA: Insufficient documentation

## 2016-02-21 DIAGNOSIS — E119 Type 2 diabetes mellitus without complications: Secondary | ICD-10-CM | POA: Diagnosis not present

## 2016-02-21 DIAGNOSIS — Z87891 Personal history of nicotine dependence: Secondary | ICD-10-CM | POA: Insufficient documentation

## 2016-02-21 DIAGNOSIS — F319 Bipolar disorder, unspecified: Secondary | ICD-10-CM | POA: Insufficient documentation

## 2016-02-21 DIAGNOSIS — E039 Hypothyroidism, unspecified: Secondary | ICD-10-CM | POA: Diagnosis not present

## 2016-02-21 LAB — COMPREHENSIVE METABOLIC PANEL
ALT: 65 U/L — ABNORMAL HIGH (ref 14–54)
AST: 49 U/L — ABNORMAL HIGH (ref 15–41)
Albumin: 4.6 g/dL (ref 3.5–5.0)
Alkaline Phosphatase: 130 U/L — ABNORMAL HIGH (ref 38–126)
Anion gap: 10 (ref 5–15)
BUN: 12 mg/dL (ref 6–20)
CO2: 29 mmol/L (ref 22–32)
Calcium: 10.1 mg/dL (ref 8.9–10.3)
Chloride: 100 mmol/L — ABNORMAL LOW (ref 101–111)
Creatinine, Ser: 0.86 mg/dL (ref 0.44–1.00)
GFR calc Af Amer: >60 mL/min (ref >60)
GFR calc non Af Amer: >60 mL/min (ref >60)
Glucose, Bld: 134 mg/dL — ABNORMAL HIGH (ref 65–99)
Potassium: 5 mmol/L (ref 3.5–5.1)
Sodium: 139 mmol/L (ref 135–145)
Total Bilirubin: 0.7 mg/dL (ref 0.3–1.2)
Total Protein: 7.9 g/dL (ref 6.5–8.1)

## 2016-02-21 LAB — CBC WITH DIFFERENTIAL/PLATELET
Basophils Absolute: 0.1 10*3/uL (ref 0–0.1)
Basophils Relative: 1 %
Eosinophils Absolute: 0.3 10*3/uL (ref 0–0.7)
Eosinophils Relative: 3 %
HCT: 46.7 % (ref 35.0–47.0)
Hemoglobin: 15.7 g/dL (ref 12.0–16.0)
Lymphocytes Relative: 23 %
Lymphs Abs: 2.8 10*3/uL (ref 1.0–3.6)
MCH: 29.5 pg (ref 26.0–34.0)
MCHC: 33.6 g/dL (ref 32.0–36.0)
MCV: 87.9 fL (ref 80.0–100.0)
Monocytes Absolute: 0.9 10*3/uL (ref 0.2–0.9)
Monocytes Relative: 7 %
Neutro Abs: 8.2 10*3/uL — ABNORMAL HIGH (ref 1.4–6.5)
Neutrophils Relative %: 66 %
Platelets: 444 10*3/uL — ABNORMAL HIGH (ref 150–440)
RBC: 5.31 MIL/uL — ABNORMAL HIGH (ref 3.80–5.20)
RDW: 15 % — ABNORMAL HIGH (ref 11.5–14.5)
WBC: 12.4 10*3/uL — ABNORMAL HIGH (ref 3.6–11.0)

## 2016-02-21 NOTE — Assessment & Plan Note (Addendum)
#   LEFT Breast Stage I s/p mastec; on Low risk mammoprint- on Letrozle. NED.   # Hot flashes- G-1-2. On Effexor; slight improved.   # Ulcerative colitis/ polyps- s/p colonoscopy- 2017 [Dr.Perry; [place]]; awaiting repeat colonoscopy in Feb 2018.   # slightly elevated LFTs- pt agrees to follow up with GI.   # Arthritis- sec to AI. stable.    # follow up in 6 months/labs.   Dr. Levonne Spiller- Pscychiatrist; [place].

## 2016-02-21 NOTE — Progress Notes (Signed)
Patient here today for follow up.   

## 2016-02-21 NOTE — Progress Notes (Signed)
[place] OFFICE PROGRESS NOTE  Patient Care Team: Abner Greenspan, MD as PCP - General Elayne Snare, MD as Consulting Physician (Endocrinology) Chucky May, MD as Consulting Physician (Psychiatry) Rosina Lowenstein, MD (Obstetrics and Gynecology) Christene Lye, MD (General Surgery) Minna Merritts, MD as Consulting Physician (Cardiology)  Cancer Staging Carcinoma of left breast [facility]) Staging form: Breast, AJCC 7th Edition - Clinical: Stage IA (T1b, N0, M0) - Unsigned Staging comments: Kidney frequent changes in the 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  No invasive cancer in the right breast.  Status post bilateral mastectomy    Oncology History   1.  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  Left breast: T1 b N0 M0 stage IB estrogen and progesterone receptor HER-2/neu Not overexpressed 2.    Multi-gene analysis with  MAMOPRINT low risk for recurrent disease Patient was started on letrozole and calcium and vitamin D (June, 2016)     Carcinoma of left breast ([place])   05/23/2014 Initial Diagnosis    Carcinoma of left breast       Carcinoma of overlapping sites of left breast in female, estrogen receptor positive ([place])    INTERVAL HISTORY:  Lindsay Fry 56 y.o.  female pleasant patient above history of Left-sided breast cancer and also right-sided DCIS status post bilateral mastectomy currently on letrozole is here for follow-up.  Hot flashes improved since starting Effexor. Patient  denies any lumps or bumps. She has chronic arthralgias which are improved on Neurontin. She is awaiting a GI referral regarding her history of ulcerative colitis.Marland Kitchen   REVIEW OF SYSTEMS:  A complete 10 point review of system is done which is negative except mentioned above/history of present illness.   PAST MEDICAL HISTORY :  Past Medical History:  Diagnosis Date  . ADD (attention deficit disorder) 11/19/2012  . Allergic rhinitis, cause unspecified   . Anxiety state, unspecified   . Arthritis   . Asymptomatic postmenopausal status (age-related) (natural)   . Atypical hyperplasia of left breast 2000  . Benign neoplasm of other and unspecified site of the digestive system   . Bipolar disorder ([place])   . Breast cancer ([place])    Left Breast 60m invasive CA left uiq, dcis left uoq and a lot ADH, ALH in both breasts.  .Marland KitchenCAD (coronary artery disease)    a. 08/2014 NSTEMI/Cath: LM nl, LAD 50p, D1/D2 min irregs, LCX nl, OM1 40, LPDA nl, RI 99ost (2.079mvessel->med Rx), RCA nl, AM 60, nl EF.  . Carcinoma of left breast ([place]/19/2016  . Chronic diastolic CHF (congestive heart failure) ([place]   a. 08/2014 Echo: EF 60-65%, Gr 1 DD, nl LA.  . Marland Kitchenyst    on Achilles tendon  . Depression   . Diabetes mellitus ([facility] [place]   frank  . Dysuria   . Edema   . Family history of malignant neoplasm of gastrointestinal tract   . Fibromyalgia   . GERD (gastroesophageal reflux disease)   . Glaucoma    pre glaucoma  . Headache  migraines  . Hiatal hernia   . History of alcoholism ([place])   . History of migraines   . History of ovarian cyst   . Insomnia, unspecified   . Myocardial infarction 09-04-14  . OCD (obsessive compulsive disorder)   . Other screening mammogram   . Pure hypercholesterolemia   . Rosacea   . Subacute confusional state 11/19/2012  . Tubular adenoma of colon 01/10/11  . Ulcerative colitis, unspecified   . Unspecified asthma(493.90)   . Unspecified essential hypertension   . Unspecified  hypothyroidism   . Unspecified vitamin D deficiency   . Vertigo     PAST SURGICAL HISTORY :   Past Surgical History:  Procedure Laterality Date  . APPENDECTOMY    . AXILLARY SENTINEL NODE BIOPSY Left 05/23/2014   Procedure: AXILLARY SENTINEL NODE BIOPSY;  Surgeon: Christene Lye, MD;  Location: [facility] ORS;  Service: General;  Laterality: Left;  . BREAST BIOPSY  Aug 2000   on tamoxifen, atypical hyperplasia  . BREAST SURGERY Left Aug 2000   lumpectomy/ Dr Sharlet Salina  . BREAST SURGERY Bilateral 05/23/14   Mastectomy  . BUNIONECTOMY Right   . CARDIAC CATHETERIZATION N/A 09/05/2014   Procedure: Left Heart Cath and Coronary Angiography;  Surgeon: Wellington Hampshire, MD;  Location: [place] [facility];  Service: Cardiovascular;  Laterality: N/A;  . CARDIAC CATHETERIZATION  09-05-14  . CESAREAN SECTION  1996/1997   placenta previa, gest DM, pre-eclampsia  . cholecystect  1999  . CHOLECYSTECTOMY  1997   adhesions also  . COLONOSCOPY  01/2001   Ulcerative colitis  . COLONOSCOPY  04/2004   UC, polyp  . COLONOSCOPY  12/08   UC, no polyps  . DEXA  04/1999 and 2010   normal  . ESOPHAGOGASTRODUODENOSCOPY  09/2001   polyp  . exercise stress test  11/2003   negative  . FEMUR FRACTURE SURGERY Left   . MASTECTOMY Bilateral 05-23-14   Dr. Jamal Collin  . MECKEL DIVERTICULUM EXCISION  1999  . NASAL SINUS SURGERY  05/1997  . nuclear stress test  06/2007   negative  . precancerous mole removed    . SIMPLE MASTECTOMY WITH AXILLARY SENTINEL NODE BIOPSY Bilateral 05/23/2014   Procedure: Bilateral simple mastectomy, left sentinel node biopsy ;  Surgeon: Christene Lye, MD;  Location: [facility] ORS;  Service: General;  Laterality: Bilateral;  . Sleep study  11/09   no apnea, but did snore (done by [facility])  . TONSILLECTOMY  1984  . TUBAL LIGATION    . WISDOM TOOTH EXTRACTION  1980's    FAMILY HISTORY :   Family History  Problem Relation Age of Onset  . Coronary artery disease Father   . Colon cancer  Father   . Alzheimer's disease Father   . Dementia Father   . Coronary artery disease Mother   . Hypertension Mother   . Osteoporosis Mother   . Colitis Mother   . Crohn's disease Mother   . Breast cancer      great aunts  . Coronary artery disease      Uncle (also AAA)  . Diabetes      remote family history  . Stroke Cousin   . Esophageal cancer Neg Hx   . Rectal cancer Neg Hx   . Stomach cancer Neg Hx     SOCIAL HISTORY:   Social History  Substance Use Topics  . Smoking status: Former Smoker    Packs/day: 0.25    Years: 5.00  Types: Cigarettes    Quit date: 01/14/1993  . Smokeless tobacco: Never Used  . Alcohol use No     Comment: Recovered ETOH    ALLERGIES:  is allergic to ephedrine; aspirin; crestor [rosuvastatin]; erythromycin; nsaids; and oxycodone.  MEDICATIONS:  Current Outpatient Prescriptions  Medication Sig Dispense Refill  . Asenapine Maleate (SAPHRIS) 10 MG SUBL Place 20 mg under the tongue at bedtime.    Marland Kitchen aspirin 81 MG tablet Take 81 mg by mouth daily.    Marland Kitchen atorvastatin (LIPITOR) 80 MG tablet TAKE 1 TABLET(80 MG) BY MOUTH DAILY 30 tablet 3  . b complex vitamins capsule Take 1 capsule by mouth daily.    Marland Kitchen CARTIA XT 240 MG 24 hr capsule TAKE 1 CAPSULE(240 MG) BY MOUTH DAILY 90 capsule 3  . chlorproMAZINE (THORAZINE) 25 MG tablet Take 25-50 mg by mouth as needed. Reported on 03/02/2015    . Cholecalciferol (VITAMIN D3) 2000 UNITS TABS Take 1 tablet by mouth daily.    . cloNIDine (CATAPRES) 0.1 MG tablet Take 1 tablet (0.1 mg total) by mouth 2 (two) times daily. 180 tablet 3  . co-enzyme Q-10 50 MG capsule Take 50 mg by mouth daily.    . diazepam (VALIUM) 10 MG tablet Take 2.5-10 mg by mouth as needed for anxiety.    . fluticasone (FLONASE) 50 MCG/ACT nasal spray USE 2 SPRAYS IN EACH NOSTRIL TWICE DAILY 16 g 5  . gabapentin (NEURONTIN) 300 MG capsule TAKE 1 CAPSULE(300 MG) BY MOUTH THREE TIMES DAILY 90 capsule 3  . Insulin Detemir (LEVEMIR) 100 UNIT/ML Pen  Inject 32 Units into the skin daily at 10 pm. 15 mL 2  . Insulin Pen Needle (B-D UF III MINI PEN NEEDLES) 31G X 5 MM MISC Use to inject 2 times per day. 100 each 2  . INVOKANA 300 MG TABS tablet TAKE 1 TABLET(300 MG) BY MOUTH DAILY BEFORE BREAKFAST 30 tablet 5  . lamoTRIgine (LAMICTAL) 200 MG tablet Take 400 mg by mouth daily.    Marland Kitchen latanoprost (XALATAN) 0.005 % ophthalmic solution Place 1 drop into both eyes At bedtime.    Marland Kitchen letrozole (FEMARA) 2.5 MG tablet Take 1 tablet (2.5 mg total) by mouth daily. 30 tablet 6  . Liniments (SALONPAS PAIN RELIEF PATCH EX) Apply topically as needed. Reported on 03/02/2015    . mesalamine (LIALDA) 1.2 g EC tablet TAKE 1 TABLET BY MOUTH EVERY DAY WITH BREAKFAST 30 tablet 3  . metFORMIN (GLUCOPHAGE-XR) 500 MG 24 hr tablet TAKE 4 TABLETS BY MOUTH DAILY 120 tablet 3  . Multiple Vitamin (MULTIVITAMIN) tablet Take 1 tablet by mouth daily.      . nebivolol (BYSTOLIC) 10 MG tablet Take 1 tablet (10 mg total) by mouth 3 (three) times daily. Takes 1 tablet in the morning and 2 tablets after lunch 270 tablet 3  . nitrofurantoin (MACRODANTIN) 100 MG capsule Take 100 mg by mouth as directed. Reported on 03/02/2015    . Omega-3 Krill Oil 500 MG CAPS Take 500 mg by mouth.    . ondansetron (ZOFRAN) 4 MG tablet Take 1 tablet (4 mg total) by mouth every 8 (eight) hours as needed for nausea or vomiting. 30 tablet 1  . ONETOUCH VERIO test strip USE AS DIRECTED TO CHECK BLOOD SUGAR TWICE DAILY 100 each 5  . pantoprazole (PROTONIX) 40 MG tablet TAKE 1 TABLET BY MOUTH TWICE DAILY 60 tablet 3  . Probiotic Product (PROBIOTIC DAILY PO) Take by mouth.    . ranitidine (ZANTAC) 300  MG tablet Take 0.5 tablets (150 mg total) by mouth 2 (two) times daily. 60 tablet 3  . Simethicone (GAS-X PO) Take 1 tablet by mouth 4 (four) times daily.    Marland Kitchen SYNTHROID 137 MCG tablet TAKE 2 TABLETS BY MOUTH DAILY BEFORE BREAKFAST. 60 tablet 3  . ticagrelor (BRILINTA) 60 MG TABS tablet Take 1 tablet (60 mg total)  by mouth 2 (two) times daily. 180 tablet 3  . traZODone (DESYREL) 50 MG tablet Take 50 mg by mouth at bedtime.    . valACYclovir (VALTREX) 500 MG tablet Take 500 mg by mouth 2 (two) times daily.      Marland Kitchen VICTOZA 18 MG/3ML SOPN INJECT 1.8 MG UNDER THE SKIN DAILY AT THE SAME TIME EVERY DAY 9 mL 0  . vitamin C (ASCORBIC ACID) 500 MG tablet Take 250 mg by mouth daily. Takes 2 a day    . venlafaxine XR (EFFEXOR-XR) 150 MG 24 hr capsule Take 150 mg by mouth daily.  5   No current facility-administered medications for this visit.     PHYSICAL EXAMINATION: ECOG PERFORMANCE STATUS: 0 - Asymptomatic  BP 126/85 (BP Location: Right Arm, Patient Position: Sitting)   Pulse 77   Temp 97.1 F (36.2 C) (Tympanic)   Wt 227 lb 2 oz (103 kg)   LMP 08/23/2006 (Approximate)   BMI 36.66 kg/m   Filed Weights   02/21/16 1535  Weight: 227 lb 2 oz (103 kg)    GENERAL: Well-nourished well-developed; Alert, no distress and comfortable.   Alone.  EYES: no pallor or icterus OROPHARYNX: no thrush or ulceration; good dentition  NECK: supple, no masses felt LYMPH:  no palpable lymphadenopathy in the cervical, axillary or inguinal regions LUNGS: clear to auscultation and  No wheeze or crackles HEART/CVS: regular rate & rhythm and no murmurs; No lower extremity edema ABDOMEN:abdomen soft, non-tender and normal bowel sounds Musculoskeletal:no cyanosis of digits and no clubbing  PSYCH: alert & oriented x 3 with fluent speech NEURO: no focal motor/sensory deficits SKIN:  no rashes or significant lesions .   LABORATORY DATA:  I have reviewed the data as listed    Component Value Date/Time   NA 139 02/21/2016 1500   K 5.0 02/21/2016 1500   CL 100 (L) 02/21/2016 1500   CO2 29 02/21/2016 1500   GLUCOSE 134 (H) 02/21/2016 1500   BUN 12 02/21/2016 1500   CREATININE 0.86 02/21/2016 1500   CALCIUM 10.1 02/21/2016 1500   PROT 7.9 02/21/2016 1500   ALBUMIN 4.6 02/21/2016 1500   AST 49 (H) 02/21/2016 1500   ALT  65 (H) 02/21/2016 1500   ALKPHOS 130 (H) 02/21/2016 1500   BILITOT 0.7 02/21/2016 1500   GFRNONAA >60 02/21/2016 1500   GFRAA >60 02/21/2016 1500    No results found for: SPEP, UPEP  Lab Results  Component Value Date   WBC 12.4 (H) 02/21/2016   NEUTROABS 8.2 (H) 02/21/2016   HGB 15.7 02/21/2016   HCT 46.7 02/21/2016   MCV 87.9 02/21/2016   PLT 444 (H) 02/21/2016      Chemistry      Component Value Date/Time   NA 139 02/21/2016 1500   K 5.0 02/21/2016 1500   CL 100 (L) 02/21/2016 1500   CO2 29 02/21/2016 1500   BUN 12 02/21/2016 1500   CREATININE 0.86 02/21/2016 1500      Component Value Date/Time   CALCIUM 10.1 02/21/2016 1500   ALKPHOS 130 (H) 02/21/2016 1500   AST 49 (H) 02/21/2016  1500   ALT 65 (H) 02/21/2016 1500   BILITOT 0.7 02/21/2016 1500       RADIOGRAPHIC STUDIES: I have personally reviewed the radiological images as listed and agreed with the findings in the report. No results found.   ASSESSMENT & PLAN:  Carcinoma of overlapping sites of left breast in female, estrogen receptor positive ([place]) # LEFT Breast Stage I s/p mastec; on Low risk mammoprint- on Letrozle. NED.   # Hot flashes- G-1-2. On Effexor; slight improved.   # Ulcerative colitis/ polyps- s/p colonoscopy- 2017 [Dr.Perry; [place]]; awaiting repeat colonoscopy in Feb 2018.   # slightly elevated LFTs- pt agrees to follow up with GI.   # Arthritis- sec to AI. stable.    # follow up in 6 months/labs.   Dr. Levonne Spiller- Pscychiatrist; [place].    Orders Placed This Encounter  Procedures  . CBC with Differential/Platelet    Standing Status:   Future    Standing Expiration Date:   11/20/2016  . Comprehensive metabolic panel    Standing Status:   Future    Standing Expiration Date:   11/20/2016      Cammie Sickle, MD 02/21/2016 4:23 PM

## 2016-02-24 ENCOUNTER — Other Ambulatory Visit: Payer: Self-pay | Admitting: Internal Medicine

## 2016-02-26 DIAGNOSIS — C50112 Malignant neoplasm of central portion of left female breast: Secondary | ICD-10-CM | POA: Diagnosis not present

## 2016-02-26 DIAGNOSIS — Z9013 Acquired absence of bilateral breasts and nipples: Secondary | ICD-10-CM | POA: Diagnosis not present

## 2016-02-26 DIAGNOSIS — C50111 Malignant neoplasm of central portion of right female breast: Secondary | ICD-10-CM | POA: Diagnosis not present

## 2016-03-05 ENCOUNTER — Ambulatory Visit (INDEPENDENT_AMBULATORY_CARE_PROVIDER_SITE_OTHER): Payer: BLUE CROSS/BLUE SHIELD | Admitting: Internal Medicine

## 2016-03-05 ENCOUNTER — Encounter: Payer: Self-pay | Admitting: Internal Medicine

## 2016-03-05 VITALS — BP 126/84 | HR 68 | Ht 67.0 in | Wt 226.0 lb

## 2016-03-05 DIAGNOSIS — K219 Gastro-esophageal reflux disease without esophagitis: Secondary | ICD-10-CM

## 2016-03-05 DIAGNOSIS — K5100 Ulcerative (chronic) pancolitis without complications: Secondary | ICD-10-CM | POA: Diagnosis not present

## 2016-03-05 DIAGNOSIS — K5901 Slow transit constipation: Secondary | ICD-10-CM

## 2016-03-05 MED ORDER — NA SULFATE-K SULFATE-MG SULF 17.5-3.13-1.6 GM/177ML PO SOLN: 1.0000 | Freq: Once | ORAL | 0 refills | Status: AC

## 2016-03-05 NOTE — Progress Notes (Signed)
HISTORY OF PRESENT ILLNESS:  Lindsay Fry is a 56 y.o. female with MULTIPLE SIGNIFICANT medical problems including morbid obesity, coronary artery disease on Brilinta, diabetes mellitus on multiple medications, and long-standing ulcerative colitis previously managed by Dr. Olevia Fry. She has a family history of colon cancer. History of chronic constipation. History of GERD. She presents today with her husband regarding surveillance colonoscopy. Patient is pleased report that she has lost 22 pounds since she was evaluated last year. He continues to diet and exercise. She has been on Lialda 1.2 g daily for some time. I performed colonoscopy 03/02/2015. She was found to have 6 adenomatous polyps measuring between 4 and 15 mm. I had concerns and have concerns regarding adenomatous polyposis with background of chronic colitis. Her weight was issue as discussed in her last office visit March 2017. In terms of interval GI issues, she continues on pantoprazole for GERD. Her GERD has been under good control. She does describe sulfur burps followed by short-lived diarrhea approximately once every 3-6 months. No bleeding. No abdominal pain. She does have bloating. She reports her constipation has been less problematic over the past year. No significant interval medical problems, hospitalizations, or surgery. Finally, she tells me that her doctors have noticed that she has elevated liver tests and that she should mention this to me. She is a reformed alcoholic. Most recent blood work 2 weeks ago shows AST of 49, ALT 65, alkaline phosphatase 130, total bilirubin 0.7. Normal protein, albumin, and globulins. Normal hemoglobin, MCV, and platelets. Liver test abnormalities have been present for over a year  REVIEW OF SYSTEMS:  All non-GI ROS negative except for sinus trouble, anxiety, arthritis, back pain, depression, fatigue, headaches, irregular heartbeat, night sweats  Past Medical History:  Diagnosis Date  . ADD  (attention deficit disorder) 11/19/2012  . Allergic rhinitis, cause unspecified   . Anxiety state, unspecified   . Arthritis   . Asymptomatic postmenopausal status (age-related) (natural)   . Atypical hyperplasia of left breast 2000  . Benign neoplasm of other and unspecified site of the digestive system   . Bipolar disorder (Lindsay Fry)   . Breast cancer ([place])    Left Breast 35mm invasive CA left uiq, dcis left uoq and a lot ADH, ALH in both breasts.  Marland Kitchen CAD (coronary artery disease)    a. 08/2014 NSTEMI/Cath: LM nl, LAD 50p, D1/D2 min irregs, LCX nl, OM1 40, LPDA nl, RI 99ost (2.36mm vessel->med Rx), RCA nl, AM 60, nl EF.  . Carcinoma of left breast ([place]) 06/02/2014  . Chronic diastolic CHF (congestive heart failure) ([place])    a. 08/2014 Echo: EF 60-65%, Gr 1 DD, nl LA.  Marland Kitchen Cyst    on Achilles tendon  . Depression   . Diabetes mellitus ([place])    frank  . Dysuria   . Edema   . Family history of malignant neoplasm of gastrointestinal tract   . Fibromyalgia   . GERD (gastroesophageal reflux disease)   . Glaucoma    pre glaucoma  . Headache    migraines  . Hiatal hernia   . History of alcoholism ([place])   . History of migraines   . History of ovarian cyst   . Insomnia, unspecified   . Myocardial infarction 09-04-14  . OCD (obsessive compulsive disorder)   . Other screening mammogram   . Pure hypercholesterolemia   . Rosacea   . Subacute confusional state 11/19/2012  . Tubular adenoma of colon 01/10/11  . Ulcerative colitis, unspecified   .  Unspecified asthma(493.90)   . Unspecified essential hypertension   . Unspecified hypothyroidism   . Unspecified vitamin D deficiency   . Vertigo     Past Surgical History:  Procedure Laterality Date  . APPENDECTOMY    . AXILLARY SENTINEL NODE BIOPSY Left 05/23/2014   Procedure: AXILLARY SENTINEL NODE BIOPSY;  Surgeon: Lindsay Lye, MD;  Location: [facility] ORS;  Service: General;  Laterality: Left;  . BREAST BIOPSY  Aug 2000   on tamoxifen,  atypical hyperplasia  . BREAST SURGERY Left Aug 2000   lumpectomy/ Dr Lindsay Fry  . BREAST SURGERY Bilateral 05/23/14   Mastectomy  . BUNIONECTOMY Right   . CARDIAC CATHETERIZATION N/A 09/05/2014   Procedure: Left Heart Cath and Coronary Angiography;  Surgeon: Lindsay Hampshire, MD;  Location: [facility];  Service: Cardiovascular;  Laterality: N/A;  . CARDIAC CATHETERIZATION  09-05-14  . CESAREAN SECTION  1996/1997   placenta previa, gest DM, pre-eclampsia  . cholecystect  1999  . CHOLECYSTECTOMY  1997   adhesions also  . COLONOSCOPY  01/2001   Ulcerative colitis  . COLONOSCOPY  04/2004   UC, polyp  . COLONOSCOPY  12/08   UC, no polyps  . DEXA  04/1999 and 2010   normal  . ESOPHAGOGASTRODUODENOSCOPY  09/2001   polyp  . exercise stress test  11/2003   negative  . FEMUR FRACTURE SURGERY Left   . MASTECTOMY Bilateral 05-23-14   Dr. Jamal Fry  . MECKEL DIVERTICULUM EXCISION  1999  . NASAL SINUS SURGERY  05/1997  . nuclear stress test  06/2007   negative  . precancerous mole removed    . SIMPLE MASTECTOMY WITH AXILLARY SENTINEL NODE BIOPSY Bilateral 05/23/2014   Procedure: Bilateral simple mastectomy, left sentinel node biopsy ;  Surgeon: Lindsay Lye, MD;  Location: [facility] ORS;  Service: General;  Laterality: Bilateral;  . Sleep study  11/09   no apnea, but did snore (done by [facility])  . TONSILLECTOMY  1984  . TUBAL LIGATION    . WISDOM TOOTH EXTRACTION  1980's    Social History Lindsay Fry  reports that she quit smoking about 23 years ago. Her smoking use included Cigarettes. She has a 1.25 pack-year smoking history. She has never used smokeless tobacco. She reports that she does not drink alcohol or use drugs.  family history includes Alzheimer's disease in her father; Colitis in her mother; Colon cancer in her father; Coronary artery disease in her father and mother; Crohn's disease in her mother; Dementia in her father; Hypertension in her mother; Osteoporosis in  her mother; Stroke in her cousin.  Allergies  Allergen Reactions  . Ephedrine Other (See Comments)    Pt becomes hyper  . Aspartame And Phenylalanine Nausea Only  . Aspirin     REACTION: aggrivates colitis  . Crestor [Rosuvastatin]     REACTION: increased lfts  . Erythromycin     REACTION: GI upset  . Nsaids     REACTION: aggrivate colitis  . Oxycodone Other (See Comments)    Panic Attack       PHYSICAL EXAMINATION: Vital signs: BP 126/84   Pulse 68   Ht 5\' 7"  (1.702 m)   Wt 226 lb (102.5 kg)   LMP 08/23/2006 (Approximate)   BMI 35.40 kg/m   Constitutional: Obese, chronically ill-appearing  no acute distress Psychiatric: Pleasant, alert and oriented x3, cooperative Eyes: extraocular movements intact, anicteric, conjunctiva pink Mouth: oral pharynx moist, no lesions Neck: supple no lymphadenopathy Cardiovascular: heart  regular rate and rhythm, no murmur Lungs: clear to auscultation bilaterally Abdomen: soft, obese, nontender, nondistended, no obvious ascites, no peritoneal signs, normal bowel sounds, no organomegaly Rectal: Deferred until colonoscopy Extremities: no clubbing cyanosis or lower extremity edema bilaterally Skin: no lesions on visible extremities Neuro: No focal deficits. Cranial nerves intact. No asterixis.    ASSESSMENT:  #1. Long-standing ulcerative colitis. On Lialda #2. Multiple adenomatous polyps on colonoscopy last year #3. Family history of colon cancer #4. Elevated hepatic transaminases likely secondary to fatty liver #5. Coronary artery disease on Brilinta #6. Diabetes mellitus on insulin and other agents #7. Multiple medical problems including morbid obesity   PLAN:  #1. Continue exercise and weight loss #2. Continue Lialda #3. Schedule surveillance colonoscopy. The patient is high risk.The nature of the procedure, as well as the risks, benefits, and alternatives were carefully and thoroughly reviewed with the patient. Ample time for  discussion and questions allowed. The patient understood, was satisfied, and agreed to proceed. #4. Hold Brilinta one week prior to the procedure as we did last year. We will confer with her cardiologist in this regard #5. Hold diabetic medications the morning of the procedure #6. Two day preparation as we did last year because of a history of poor colonic preparation. Last year her preparation was excellent #7. May consider expanding liver test abnormality evaluation

## 2016-03-05 NOTE — Patient Instructions (Signed)

## 2016-03-11 ENCOUNTER — Ambulatory Visit (INDEPENDENT_AMBULATORY_CARE_PROVIDER_SITE_OTHER): Payer: BLUE CROSS/BLUE SHIELD | Admitting: Psychology

## 2016-03-11 DIAGNOSIS — F331 Major depressive disorder, recurrent, moderate: Secondary | ICD-10-CM

## 2016-03-14 DIAGNOSIS — M7661 Achilles tendinitis, right leg: Secondary | ICD-10-CM | POA: Diagnosis not present

## 2016-03-14 DIAGNOSIS — M7660 Achilles tendinitis, unspecified leg: Secondary | ICD-10-CM | POA: Insufficient documentation

## 2016-03-19 ENCOUNTER — Ambulatory Visit (INDEPENDENT_AMBULATORY_CARE_PROVIDER_SITE_OTHER): Payer: BLUE CROSS/BLUE SHIELD | Admitting: Family Medicine

## 2016-03-19 ENCOUNTER — Encounter: Payer: Self-pay | Admitting: Family Medicine

## 2016-03-19 VITALS — BP 116/78 | HR 83 | Temp 98.8°F | Ht 67.0 in | Wt 226.5 lb

## 2016-03-19 DIAGNOSIS — E6609 Other obesity due to excess calories: Secondary | ICD-10-CM

## 2016-03-19 DIAGNOSIS — Z6835 Body mass index (BMI) 35.0-35.9, adult: Secondary | ICD-10-CM

## 2016-03-19 DIAGNOSIS — IMO0001 Reserved for inherently not codable concepts without codable children: Secondary | ICD-10-CM

## 2016-03-19 DIAGNOSIS — Z170 Estrogen receptor positive status [ER+]: Secondary | ICD-10-CM

## 2016-03-19 DIAGNOSIS — Z1159 Encounter for screening for other viral diseases: Secondary | ICD-10-CM

## 2016-03-19 DIAGNOSIS — E1159 Type 2 diabetes mellitus with other circulatory complications: Secondary | ICD-10-CM | POA: Diagnosis not present

## 2016-03-19 DIAGNOSIS — R300 Dysuria: Secondary | ICD-10-CM

## 2016-03-19 DIAGNOSIS — I10 Essential (primary) hypertension: Secondary | ICD-10-CM | POA: Diagnosis not present

## 2016-03-19 DIAGNOSIS — C50912 Malignant neoplasm of unspecified site of left female breast: Secondary | ICD-10-CM

## 2016-03-19 DIAGNOSIS — Z114 Encounter for screening for human immunodeficiency virus [HIV]: Secondary | ICD-10-CM | POA: Diagnosis not present

## 2016-03-19 DIAGNOSIS — K51919 Ulcerative colitis, unspecified with unspecified complications: Secondary | ICD-10-CM

## 2016-03-19 DIAGNOSIS — E7800 Pure hypercholesterolemia, unspecified: Secondary | ICD-10-CM | POA: Diagnosis not present

## 2016-03-19 DIAGNOSIS — C50812 Malignant neoplasm of overlapping sites of left female breast: Secondary | ICD-10-CM

## 2016-03-19 LAB — POC URINALSYSI DIPSTICK (AUTOMATED)
Bilirubin, UA: NEGATIVE
Blood, UA: NEGATIVE
Glucose, UA: 1000
Ketones, UA: NEGATIVE
Leukocytes, UA: NEGATIVE
Nitrite, UA: NEGATIVE
Protein, UA: 30
Spec Grav, UA: >=1.03
Urobilinogen, UA: 0.2
pH, UA: 6

## 2016-03-19 NOTE — Progress Notes (Signed)
Subjective:    Patient ID: Lindsay Fry, female    DOB: Jun 24, 1960, 56 y.o.   MRN: [id number]  HPI Here for f/u of chronic medical problems   Doing pretty well  Doing water exercise 3 days per week  Colonoscopy is due in April   Wt Readings from Last 3 Encounters:  03/19/16 226 lb 8 oz (102.7 kg)  03/05/16 226 lb (102.5 kg)  02/21/16 227 lb 2 oz (103 kg)  down 5 lb from the fall  Total loss 22 lb since last feb -the activity makes a bit difference  bmi 35.4  bp is stable today  No cp or palpitations or headaches or edema  No side effects to medicines  BP Readings from Last 3 Encounters:  03/19/16 116/78  03/05/16 126/84  02/21/16 126/85      Hx of hyperlipidemia Lab Results  Component Value Date   CHOL 121 11/10/2015   CHOL 154 11/28/2014   CHOL 142 10/13/2014   Lab Results  Component Value Date   HDL 36.50 (L) 11/10/2015   HDL 39.40 11/28/2014   HDL 44.30 10/13/2014   Lab Results  Component Value Date   LDLCALC 65 10/13/2014   LDLCALC 112 (H) 05/12/2013   LDLCALC 102 (H) 11/10/2012   Lab Results  Component Value Date   TRIG 217.0 (H) 11/10/2015   TRIG 235.0 (H) 11/28/2014   TRIG 163.0 (H) 10/13/2014   Lab Results  Component Value Date   CHOLHDL 3 11/10/2015   CHOLHDL 4 11/28/2014   CHOLHDL 3 10/13/2014   Lab Results  Component Value Date   LDLDIRECT 67.0 11/10/2015   LDLDIRECT 94.0 11/28/2014   LDLDIRECT 227.3 06/22/2008    lipitor and diet  LDL is improved and cardiology was happy with it    DM2 Sees Dr Dwyane Dee  Lab Results  Component Value Date   HGBA1C 6.6 (H) 02/08/2016     Hypothyroid  Sees Dr Dwyane Dee Lab Results  Component Value Date   TSH 1.42 11/10/2015   T4TOTAL 9.5 07/26/2008     No longer sees gyn  Last pap was normal - neg HPV   She wants HIV and Hep C screening   Some dysuria - used to take macrobid after sex  No longer sexually active so does not take it  No blood or odor to urine  Some frequency  Drinks  lot of water / smart water    Double mastectomy  No longer needs mammograms   Review of Systems  Review of Systems  Constitutional: Negative for fever, appetite change,  and unexpected weight change.  Eyes: Negative for pain and visual disturbance.  Respiratory: Negative for cough and shortness of breath.   Cardiovascular: Negative for cp or palpitations    Gastrointestinal: Negative for nausea, diarrhea and constipation.  Genitourinary: Negative for urgency and frequency. pos for occ dysuria  Skin: Negative for pallor or rash   Pos for myofasical pain  Neurological: Negative for weakness, light-headedness, numbness and headaches.  Hematological: Negative for adenopathy. Does not bruise/bleed easily.  Psychiatric/Behavioral: Negative for dysphoric mood. The patient is not nervous/anxious. Pos for stable bipolar            Objective:   Physical Exam  Constitutional: She appears well-developed and well-nourished. No distress.  obese and well appearing   HENT:  Head: Normocephalic and atraumatic.  Mouth/Throat: Oropharynx is clear and moist.  Eyes: Conjunctivae and EOM are normal. Pupils are equal, round, and reactive to light.  Neck: Normal range of motion. Neck supple. No JVD present. Carotid bruit is not present. No thyromegaly present.  Cardiovascular: Normal rate, regular rhythm, normal heart sounds and intact distal pulses.  Exam reveals no gallop.   Pulmonary/Chest: Effort normal and breath sounds normal. No respiratory distress. She has no wheezes. She has no rales.  No crackles  Abdominal: Soft. Bowel sounds are normal. She exhibits no distension, no abdominal bruit and no mass. There is no tenderness.  No suprapubic tenderness or fullness   No cva tenderness   Musculoskeletal: She exhibits no edema.  Lymphadenopathy:    She has no cervical adenopathy.  Neurological: She is alert. She has normal reflexes. No cranial nerve deficit. She exhibits normal muscle tone.  Coordination normal.  Skin: Skin is warm and dry. No rash noted. No pallor.  Psychiatric: She has a normal mood and affect.          Assessment & Plan:   Problem List Items Addressed This Visit      Cardiovascular and Mediastinum   Essential hypertension - Primary    bp in fair control at this time  BP Readings from Last 1 Encounters:  03/19/16 116/78   No changes needed Disc lifstyle change with low sodium diet and exercise  Labs rev        Digestive   Ulcerative colitis ([place])    More loose stool lately  No blood  Continue GI f/u        Endocrine   DM (diabetes mellitus) ([place]) (Chronic)    Sees Dr Dwyane Dee Doing well  Lab Results  Component Value Date   HGBA1C 6.6 (H) 02/08/2016   Disc eye and foot care        Other   Carcinoma of left breast [facility])    Doing well with follow ups  Double mastectomy      Carcinoma of overlapping sites of left breast in female, estrogen receptor positive ([place])    Doing well      Dysuria    ua today - concentrated with some protein  Enc her to inc fluids to 64 oz today-mostly water / avoid irritative beverages      Relevant Orders   POCT Urinalysis Dipstick (Automated) (Completed)   HYPERCHOLESTEROLEMIA    Disc goals for lipids and reasons to control them Rev labs with pt Rev low sat fat diet in detail Watched by cardiology Continues atorvastatin        Need for hepatitis C screening test    Lab done today      Relevant Orders   Hepatitis C antibody (Completed)   Obesity   Screening for HIV (human immunodeficiency virus)    Lab done today      Relevant Orders   HIV antibody (Completed)

## 2016-03-19 NOTE — Patient Instructions (Signed)
Aim for 64 oz of fluids per day (mostly water)  Keep up the great work with exercise and weigh loss Give a urine specimen on the way out  Labs for HIV and Hep C screen today

## 2016-03-19 NOTE — Progress Notes (Signed)
Pre visit review using our clinic review tool, if applicable. No additional management support is needed unless otherwise documented below in the visit note. 

## 2016-03-20 LAB — HIV ANTIBODY (ROUTINE TESTING W REFLEX): HIV 1&2 Ab, 4th Generation: NONREACTIVE

## 2016-03-20 LAB — HEPATITIS C ANTIBODY: HCV Ab: NEGATIVE

## 2016-03-21 ENCOUNTER — Other Ambulatory Visit: Payer: Self-pay | Admitting: Endocrinology

## 2016-03-21 NOTE — Assessment & Plan Note (Signed)
Lab done today 

## 2016-03-21 NOTE — Assessment & Plan Note (Signed)
More loose stool lately  No blood  Continue GI f/u

## 2016-03-21 NOTE — Assessment & Plan Note (Signed)
ua today - concentrated with some protein  Enc her to inc fluids to 64 oz today-mostly water / avoid irritative beverages

## 2016-03-21 NOTE — Assessment & Plan Note (Signed)
bp in fair control at this time  BP Readings from Last 1 Encounters:  03/19/16 116/78   No changes needed Disc lifstyle change with low sodium diet and exercise  Labs rev

## 2016-03-21 NOTE — Assessment & Plan Note (Signed)
Doing well with follow ups  Double mastectomy

## 2016-03-21 NOTE — Assessment & Plan Note (Signed)
Doing well 

## 2016-03-21 NOTE — Assessment & Plan Note (Signed)
Sees Dr Dwyane Dee Doing well  Lab Results  Component Value Date   HGBA1C 6.6 (H) 02/08/2016   Disc eye and foot care

## 2016-03-21 NOTE — Assessment & Plan Note (Signed)
Disc goals for lipids and reasons to control them Rev labs with pt Rev low sat fat diet in detail Watched by cardiology Continues atorvastatin

## 2016-03-22 ENCOUNTER — Ambulatory Visit (INDEPENDENT_AMBULATORY_CARE_PROVIDER_SITE_OTHER): Payer: BLUE CROSS/BLUE SHIELD | Admitting: Endocrinology

## 2016-03-22 ENCOUNTER — Encounter: Payer: Self-pay | Admitting: Endocrinology

## 2016-03-22 VITALS — BP 92/64 | HR 84 | Ht 67.0 in | Wt 225.0 lb

## 2016-03-22 DIAGNOSIS — E890 Postprocedural hypothyroidism: Secondary | ICD-10-CM | POA: Diagnosis not present

## 2016-03-22 DIAGNOSIS — E1165 Type 2 diabetes mellitus with hyperglycemia: Secondary | ICD-10-CM

## 2016-03-22 LAB — TSH: TSH: 0.85 u[IU]/mL (ref 0.35–4.50)

## 2016-03-22 NOTE — Patient Instructions (Signed)
Reduce Metformin to 3

## 2016-03-22 NOTE — Progress Notes (Signed)
Patient ID: Lindsay Fry, female   DOB: February 12, 1960, 56 y.o.   MRN: [id number]   Reason for Appointment:  Hypothyroidism and diabetes, followup visit  History of Present Illness:  DIABETES:  Prior history: With her high A1c of 6.7 in 03/2014 she had an abnormal glucose tolerance test indicating diabetes and 1 hour glucose of 300 She was started on metformin; however even with 1500 mg of metformin ER  blood sugars were not controlled especially fasting Subsequently with taking Janumet XR 100/1000 daily her blood sugars were much better Because of her weight gain and A1c going up to 7.3 along with occasional readings over 200 she was started on Victoza in 11/16 She has had progressive hyperglycemia previously and A1c was up to 8.6% in 4/17   Non-insulin hypoglycemic drugs: Victoza 1.8 mg daily, metformin ER 2000 mg daily, Invokana 300 mg daily  INSULIN regimen: Levemir 32 units at bedtime  A1c is recently 6.6, previously not below 7  Current management, blood sugar patterns and problems identified:  She was started on Levemir insulin in 08/2015 because of progressive rise in blood sugars and inability to control with 3 other agents  She has continued the same dose of 32 units, however occasionally may forget this at bedtime  Dinner At about 1 am  She checks her blood sugars usually around midday when she is actually getting up although this is variable  Recent FASTING readings range from 111-173 with MEDIAN 135  She has a couple of readings at night ranging from 123-147  Has not an effort to lose weight even though she is trying to exercise as before  Has been compliant with all medications including 300 mg of Invokana without side effects  Recently having diarrhea on and off for the last 2 months although previously had tolerated metformin well  GLUCOSE readings from review of One Touch Verio monitor as above:   Weight history:  Wt Readings from Last 3  Encounters:  03/22/16 225 lb (102.1 kg)  03/19/16 226 lb 8 oz (102.7 kg)  03/05/16 226 lb (102.5 kg)   Previous consultation with dietitian: none  Exercise regimen: aquatic exercises 3/7    LABS:  Lab Results  Component Value Date   HGBA1C 6.6 (H) 02/08/2016   HGBA1C 7.3 09/29/2015   HGBA1C 7.1 (H) 06/19/2015   Lab Results  Component Value Date   MICROALBUR 13.3 (H) 11/10/2015   LDLCALC 65 10/13/2014   CREATININE 0.86 02/21/2016     HYPOTHYROIDISM: This was first diagnosed in 1992 after her treatment for Graves' disease with I-131 She has been on relatively large doses of thyroxine supplements She was on 224 mcg since 11/13 and her TSH was normal in 3/14 Subjectively difficult to assess her thyroid since she tends to have fatigue chronically.  In 2014 her dose was reduced to 200 mcg daily and her dose has been fluctuating since then She has generally required periodic increase in dosage including in 05/2014 when her TSH was 20  She is  on 2 tablets of 137 g daily, continues on the brand name Synthroid  Her symptoms are usually nonspecific for hypothyroidism, mostly related to other medical problems including sleep disturbances and bipolar illness.   She has been quite regular with the Synthroid in the mornings Not taking any calcium or iron  She does complaining of more tiredness in the last few weeks         TSH is normal previously:   Lab Results  Component Value Date   TSH 0.85 03/22/2016   TSH 1.42 11/10/2015   TSH 2.07 06/19/2015   FREET4 0.96 11/10/2015   FREET4 1.11 04/25/2015   FREET4 1.24 01/25/2015     Office Visit on 03/22/2016  Component Date Value Ref Range Status  . TSH 03/22/2016 0.85  0.35 - 4.50 uIU/mL Final  Office Visit on 03/19/2016  Component Date Value Ref Range Status  . HM Pap smear 04/16/2015 neg with neg HPV   Final  . HIV 1&2 Ab, 4th Generation 03/19/2016 NONREACTIVE  NONREACTIVE Final   Comment:   HIV-1 antigen and  HIV-1/HIV-2 antibodies were not detected.  There is no laboratory evidence of HIV infection.   HIV-1/2 Antibody Diff        Not indicated. HIV-1 RNA, Qual TMA          Not indicated.     PLEASE NOTE: This information has been disclosed to you from records whose confidentiality may be protected by state law. If your state requires such protection, then the state law prohibits you from making any further disclosure of the information without the specific written consent of the person to whom it pertains, or as otherwise permitted by law. A general authorization for the release of medical or other information is NOT sufficient for this purpose.   The performance of this assay has not been clinically validated in patients less than 28 years old.   For additional information please refer to http://education.quest[employer].com/faq/FAQ106.  (This link is being provided for informational/educational purposes only.)     . HCV Ab 03/19/2016 NEGATIVE  NEGATIVE Final  . Color, UA 03/19/2016 Dark Yellow   Final  . Clarity, UA 03/19/2016 Clear   Final  . Glucose, UA 03/19/2016 1000 mg/dL   Final  . Bilirubin, UA 03/19/2016 Negative   Final  . Ketones, UA 03/19/2016 Negative   Final  . Spec Grav, UA 03/19/2016 >=1.030   Final  . Blood, UA 03/19/2016 Negative   Final  . pH, UA 03/19/2016 6.0   Final  . Protein, UA 03/19/2016 30 mg/dL   Final  . Urobilinogen, UA 03/19/2016 0.2   Final  . Nitrite, UA 03/19/2016 Negative   Final  . Leukocytes, UA 03/19/2016 Negative  Negative Final    Allergies as of 03/22/2016      Reactions   Ephedrine Other (See Comments)   Pt becomes hyper   Aspartame And Phenylalanine Nausea Only   Aspirin    REACTION: aggrivates colitis   Crestor [rosuvastatin]    REACTION: increased lfts   Erythromycin    REACTION: GI upset   Nsaids    REACTION: aggrivate colitis   Oxycodone Other (See Comments)   Panic Attack      Medication List       Accurate as of  03/22/16 11:59 PM. Always use your most recent med list.          aspirin 81 MG tablet Take 81 mg by mouth daily.   atorvastatin 80 MG tablet Commonly known as:  LIPITOR TAKE 1 TABLET(80 MG) BY MOUTH DAILY   b complex vitamins capsule Take 1 capsule by mouth daily.   CARTIA XT 240 MG 24 hr capsule Generic drug:  diltiazem TAKE 1 CAPSULE(240 MG) BY MOUTH DAILY   chlorproMAZINE 25 MG tablet Commonly known as:  THORAZINE Take 25-50 mg by mouth as needed. Reported on 03/02/2015   cloNIDine 0.1 MG tablet Commonly known as:  CATAPRES Take 1 tablet (0.1 mg total)  by mouth 2 (two) times daily.   co-enzyme Q-10 50 MG capsule Take 50 mg by mouth daily.   diazepam 10 MG tablet Commonly known as:  VALIUM Take 2.5-10 mg by mouth as needed for anxiety.   fluticasone 50 MCG/ACT nasal spray Commonly known as:  FLONASE USE 2 SPRAYS IN EACH NOSTRIL TWICE DAILY   gabapentin 300 MG capsule Commonly known as:  NEURONTIN TAKE 1 CAPSULE(300 MG) BY MOUTH THREE TIMES DAILY   GAS-X PO Take 1 tablet by mouth 4 (four) times daily.   Insulin Detemir 100 UNIT/ML Pen Commonly known as:  LEVEMIR Inject 32 Units into the skin daily at 10 pm.   Insulin Pen Needle 31G X 5 MM Misc Commonly known as:  B-D UF III MINI PEN NEEDLES Use to inject 2 times per day.   INVOKANA 300 MG Tabs tablet Generic drug:  canagliflozin TAKE 1 TABLET(300 MG) BY MOUTH DAILY BEFORE BREAKFAST   lamoTRIgine 200 MG tablet Commonly known as:  LAMICTAL Take 400 mg by mouth daily.   latanoprost 0.005 % ophthalmic solution Commonly known as:  XALATAN Place 1 drop into both eyes At bedtime.   letrozole 2.5 MG tablet Commonly known as:  FEMARA Take 1 tablet (2.5 mg total) by mouth daily.   Melatonin 10 MG Caps Take 1 capsule by mouth at bedtime.   mesalamine 1.2 g EC tablet Commonly known as:  LIALDA TAKE 1 TABLET BY MOUTH EVERY DAY WITH BREAKFAST   metFORMIN 500 MG 24 hr tablet Commonly known as:   GLUCOPHAGE-XR TAKE 4 TABLETS BY MOUTH DAILY   multivitamin tablet Take 1 tablet by mouth daily.   nebivolol 10 MG tablet Commonly known as:  BYSTOLIC Take 1 tablet (10 mg total) by mouth 3 (three) times daily. Takes 1 tablet in the morning and 2 tablets after lunch   nitrofurantoin 100 MG capsule Commonly known as:  MACRODANTIN Take 100 mg by mouth as directed. Reported on 03/02/2015   Omega-3 Krill Oil 500 MG Caps Take 500 mg by mouth.   ondansetron 4 MG tablet Commonly known as:  ZOFRAN Take 1 tablet (4 mg total) by mouth every 8 (eight) hours as needed for nausea or vomiting.   ONETOUCH VERIO test strip Generic drug:  glucose blood USE AS DIRECTED TO CHECK BLOOD SUGAR TWICE DAILY   pantoprazole 40 MG tablet Commonly known as:  PROTONIX TAKE 1 TABLET BY MOUTH TWICE DAILY   PROBIOTIC DAILY PO Take by mouth.   ranitidine 300 MG tablet Commonly known as:  ZANTAC Take 0.5 tablets (150 mg total) by mouth 2 (two) times daily.   SALONPAS PAIN RELIEF PATCH EX Apply topically as needed. Reported on 03/02/2015   SAPHRIS 10 MG Subl Generic drug:  Asenapine Maleate Place 20 mg under the tongue at bedtime.   SYNTHROID 137 MCG tablet Generic drug:  levothyroxine TAKE 2 TABLETS BY MOUTH DAILY BEFORE BREAKFAST.   ticagrelor 60 MG Tabs tablet Commonly known as:  BRILINTA Take 1 tablet (60 mg total) by mouth 2 (two) times daily.   traZODone 50 MG tablet Commonly known as:  DESYREL Take 50 mg by mouth at bedtime.   valACYclovir 500 MG tablet Commonly known as:  VALTREX Take 500 mg by mouth 2 (two) times daily.   venlafaxine XR 150 MG 24 hr capsule Commonly known as:  EFFEXOR-XR Take 150 mg by mouth daily.   VICTOZA 18 MG/3ML Sopn Generic drug:  liraglutide INJECT 1.8 MG UNDER THE SKIN DAILY AT THE  SAME TIME EVERY DAY   vitamin C 500 MG tablet Commonly known as:  ASCORBIC ACID Take 250 mg by mouth daily. Takes 2 a day   Vitamin D3 2000 units Tabs Take 1 tablet by  mouth daily.       Allergies:  Allergies  Allergen Reactions  . Ephedrine Other (See Comments)    Pt becomes hyper  . Aspartame And Phenylalanine Nausea Only  . Aspirin     REACTION: aggrivates colitis  . Crestor [Rosuvastatin]     REACTION: increased lfts  . Erythromycin     REACTION: GI upset  . Nsaids     REACTION: aggrivate colitis  . Oxycodone Other (See Comments)    Panic Attack    Past Medical History:  Diagnosis Date  . ADD (attention deficit disorder) 11/19/2012  . Allergic rhinitis, cause unspecified   . Anxiety state, unspecified   . Arthritis   . Asymptomatic postmenopausal status (age-related) (natural)   . Atypical hyperplasia of left breast 2000  . Benign neoplasm of other and unspecified site of the digestive system   . Bipolar disorder ([place])   . Breast cancer ([place])    Left Breast 74mm invasive CA left uiq, dcis left uoq and a lot ADH, ALH in both breasts.  Marland Kitchen CAD (coronary artery disease)    a. 08/2014 NSTEMI/Cath: LM nl, LAD 50p, D1/D2 min irregs, LCX nl, OM1 40, LPDA nl, RI 99ost (2.71mm vessel->med Rx), RCA nl, AM 60, nl EF.  . Carcinoma of left breast ([place]) 06/02/2014  . Chronic diastolic CHF (congestive heart failure) ([place])    a. 08/2014 Echo: EF 60-65%, Gr 1 DD, nl LA.  Marland Kitchen Cyst    on Achilles tendon  . Depression   . Diabetes mellitus ([place])    frank  . Dysuria   . Edema   . Family history of malignant neoplasm of gastrointestinal tract   . Fibromyalgia   . GERD (gastroesophageal reflux disease)   . Glaucoma    pre glaucoma  . Headache    migraines  . Hiatal hernia   . History of alcoholism ([place])   . History of migraines   . History of ovarian cyst   . Insomnia, unspecified   . Myocardial infarction 09-04-14  . OCD (obsessive compulsive disorder)   . Other screening mammogram   . Pure hypercholesterolemia   . Rosacea   . Subacute confusional state 11/19/2012  . Tubular adenoma of colon 01/10/11  . Ulcerative colitis, unspecified   .  Unspecified asthma(493.90)   . Unspecified essential hypertension   . Unspecified hypothyroidism   . Unspecified vitamin D deficiency   . Vertigo     Past Surgical History:  Procedure Laterality Date  . APPENDECTOMY    . AXILLARY SENTINEL NODE BIOPSY Left 05/23/2014   Procedure: AXILLARY SENTINEL NODE BIOPSY;  Surgeon: Christene Lye, MD;  Location: [facility] ORS;  Service: General;  Laterality: Left;  . BREAST BIOPSY  Aug 2000   on tamoxifen, atypical hyperplasia  . BREAST SURGERY Left Aug 2000   lumpectomy/ Dr Sharlet Salina  . BREAST SURGERY Bilateral 05/23/14   Mastectomy  . BUNIONECTOMY Right   . CARDIAC CATHETERIZATION N/A 09/05/2014   Procedure: Left Heart Cath and Coronary Angiography;  Surgeon: Wellington Hampshire, MD;  Location: [facility];  Service: Cardiovascular;  Laterality: N/A;  . CARDIAC CATHETERIZATION  09-05-14  . CESAREAN SECTION  1996/1997   placenta previa, gest DM, pre-eclampsia  . cholecystect  1999  .  CHOLECYSTECTOMY  1997   adhesions also  . COLONOSCOPY  01/2001   Ulcerative colitis  . COLONOSCOPY  04/2004   UC, polyp  . COLONOSCOPY  12/08   UC, no polyps  . DEXA  04/1999 and 2010   normal  . ESOPHAGOGASTRODUODENOSCOPY  09/2001   polyp  . exercise stress test  11/2003   negative  . FEMUR FRACTURE SURGERY Left   . MASTECTOMY Bilateral 05-23-14   Dr. Jamal Collin  . MECKEL DIVERTICULUM EXCISION  1999  . NASAL SINUS SURGERY  05/1997  . nuclear stress test  06/2007   negative  . precancerous mole removed    . SIMPLE MASTECTOMY WITH AXILLARY SENTINEL NODE BIOPSY Bilateral 05/23/2014   Procedure: Bilateral simple mastectomy, left sentinel node biopsy ;  Surgeon: Christene Lye, MD;  Location: [facility] OR[facility];  Service: General;  Laterality: Bilateral;  . Sleep study  11/09   no apnea, but did snore (done by [facility])  . TONSILLECTOMY  1984  . TUBAL LIGATION    . WISDOM TOOTH EXTRACTION  1980's    Family History  Problem Relation Age of Onset  . Coronary  artery disease Father   . Colon cancer Father   . Alzheimer's disease Father   . Dementia Father   . Coronary artery disease Mother   . Hypertension Mother   . Osteoporosis Mother   . Colitis Mother   . Crohn's disease Mother   . Breast cancer      great aunts  . Coronary artery disease      Uncle (also AAA)  . Diabetes      remote family history  . Stroke Cousin   . Esophageal cancer Neg Hx   . Rectal cancer Neg Hx   . Stomach cancer Neg Hx     Social History:  reports that she quit smoking about 23 years ago. Her smoking use included Cigarettes. She has a 1.25 pack-year smoking history. She has never used smokeless tobacco. She reports that she does not drink alcohol or use drugs.  REVIEW Of SYSTEMS:   HYPERCALCEMIA: Her calcium has been Mildly increased previously  Calcium supplements had been stopped She is taking OTC vitamin D3, 2000 units    Lab Results  Component Value Date   CALCIUM 10.1 02/21/2016   PHOS 2.3 01/29/2007   Lab Results  Component Value Date   VD25OH 44 05/12/2013   VD25OH 47 05/19/2012   VD25OH 42 04/23/2011     HYPERTENSION: Blood pressure is controlled, managed by cardiologist.   Currently on low-dose clonidine, Bystolic twice a day and diltiazem    Lab Results  Component Value Date   CREATININE 0.86 02/21/2016   BUN 12 02/21/2016   NA 139 02/21/2016   K 5.0 02/21/2016   CL 100 (L) 02/21/2016   CO2 29 02/21/2016    History of hypercholesterolemia: Previously on 20 mg Lipitor now 80 mg since her MI   Lab Results  Component Value Date   CHOL 121 11/10/2015   HDL 36.50 (L) 11/10/2015   LDLCALC 65 10/13/2014   LDLDIRECT 67.0 11/10/2015   TRIG 217.0 (H) 11/10/2015   CHOLHDL 3 11/10/2015    Her gastroenterologist feels that ALT is high from fatty liver, this Has been persistently high  Lab Results  Component Value Date   ALT 65 (H) 02/21/2016      Examination:   BP 92/64   Pulse 84   Ht 5\' 7"  (1.702 m)   Wt 225 lb  (  102.1 kg)   LMP 08/23/2006 (Approximate)   SpO2 95%   BMI 35.24 kg/m    Her face is cushingoid     Assessments   DIABETES:  See history of present illness for detailed discussion of his current management, blood sugar patterns and problems identified  Her blood sugars are gradually improving and A1c is now finally below 7% Her fasting readings may be high at times they are averaging below 140 Has a few nonfasting readings which are not high Able to maintain her exercise regimen and usually a good diet.  Recommendations today:  No change in Basal insulin with 32 units   She did start taking this at dinnertime so she does not forget  Encouraged her to continue monitoring some postprandial readings  Keep watching diet on Weight Watchers plan.  She has had diarrhea for the last couple of months and will apparently average cut down her metformin to 3 tablets  She will continue insulin, Invokana and Victoza unchanged  Hypothyroidism, post ablative: She complains of fatigue and will recheck her TSH TSH back to normal and will continue same dose   Patient Instructions  Reduce Metformin to 3   .  Duke Weisensel 03/24/2016, 5:55 PM   Note: This office note was prepared with Dragon voice recognition system technology. Any transcriptional errors that result from this process are unintentional.

## 2016-03-23 ENCOUNTER — Other Ambulatory Visit: Payer: Self-pay | Admitting: Endocrinology

## 2016-03-24 ENCOUNTER — Encounter: Payer: Self-pay | Admitting: Endocrinology

## 2016-03-25 ENCOUNTER — Encounter: Payer: Self-pay | Admitting: Internal Medicine

## 2016-03-26 ENCOUNTER — Other Ambulatory Visit: Payer: Self-pay

## 2016-03-26 DIAGNOSIS — R197 Diarrhea, unspecified: Secondary | ICD-10-CM

## 2016-03-28 ENCOUNTER — Encounter: Payer: Self-pay | Admitting: Endocrinology

## 2016-03-28 ENCOUNTER — Other Ambulatory Visit: Payer: Self-pay | Admitting: Endocrinology

## 2016-03-29 ENCOUNTER — Other Ambulatory Visit: Payer: Self-pay

## 2016-03-29 MED ORDER — VICTOZA 18 MG/3ML ~~LOC~~ SOPN: PEN_INJECTOR | SUBCUTANEOUS | 2 refills | Status: DC

## 2016-04-01 DIAGNOSIS — H40051 Ocular hypertension, right eye: Secondary | ICD-10-CM | POA: Diagnosis not present

## 2016-04-02 ENCOUNTER — Encounter: Payer: Self-pay | Admitting: Family Medicine

## 2016-04-02 ENCOUNTER — Ambulatory Visit (INDEPENDENT_AMBULATORY_CARE_PROVIDER_SITE_OTHER): Payer: BLUE CROSS/BLUE SHIELD | Admitting: Psychology

## 2016-04-02 DIAGNOSIS — F3131 Bipolar disorder, current episode depressed, mild: Secondary | ICD-10-CM | POA: Diagnosis not present

## 2016-04-03 ENCOUNTER — Telehealth: Payer: Self-pay | Admitting: Family Medicine

## 2016-04-03 MED ORDER — VALACYCLOVIR HCL 500 MG PO TABS: 500.0000 mg | ORAL_TABLET | Freq: Two times a day (BID) | ORAL | 11 refills | Status: DC

## 2016-04-04 ENCOUNTER — Encounter: Payer: Self-pay | Admitting: Family Medicine

## 2016-04-08 ENCOUNTER — Encounter: Payer: Self-pay | Admitting: Internal Medicine

## 2016-04-08 ENCOUNTER — Other Ambulatory Visit: Payer: Self-pay | Admitting: Oncology

## 2016-04-08 ENCOUNTER — Encounter: Payer: Self-pay | Admitting: Endocrinology

## 2016-04-08 DIAGNOSIS — C50912 Malignant neoplasm of unspecified site of left female breast: Secondary | ICD-10-CM

## 2016-04-09 ENCOUNTER — Encounter: Payer: Self-pay | Admitting: Family Medicine

## 2016-04-09 ENCOUNTER — Other Ambulatory Visit: Payer: Self-pay | Admitting: *Deleted

## 2016-04-09 DIAGNOSIS — C50812 Malignant neoplasm of overlapping sites of left female breast: Secondary | ICD-10-CM

## 2016-04-09 DIAGNOSIS — Z170 Estrogen receptor positive status [ER+]: Secondary | ICD-10-CM

## 2016-04-09 MED ORDER — LETROZOLE 2.5 MG PO TABS: 2.5000 mg | ORAL_TABLET | Freq: Every day | ORAL | 0 refills | Status: DC

## 2016-04-09 MED ORDER — LETROZOLE 2.5 MG PO TABS: 2.5000 mg | ORAL_TABLET | Freq: Every day | ORAL | 12 refills | Status: DC

## 2016-04-15 ENCOUNTER — Encounter: Payer: Self-pay | Admitting: Cardiovascular Disease

## 2016-04-23 ENCOUNTER — Ambulatory Visit (INDEPENDENT_AMBULATORY_CARE_PROVIDER_SITE_OTHER): Payer: BLUE CROSS/BLUE SHIELD | Admitting: Psychology

## 2016-04-23 DIAGNOSIS — F3131 Bipolar disorder, current episode depressed, mild: Secondary | ICD-10-CM | POA: Diagnosis not present

## 2016-04-25 ENCOUNTER — Other Ambulatory Visit: Payer: Self-pay | Admitting: Cardiovascular Disease

## 2016-04-25 ENCOUNTER — Other Ambulatory Visit: Payer: Self-pay | Admitting: Endocrinology

## 2016-05-02 ENCOUNTER — Other Ambulatory Visit: Payer: Self-pay | Admitting: Cardiovascular Disease

## 2016-05-02 ENCOUNTER — Encounter: Payer: Self-pay | Admitting: Cardiovascular Disease

## 2016-05-06 ENCOUNTER — Other Ambulatory Visit: Payer: Self-pay | Admitting: Endocrinology

## 2016-05-09 ENCOUNTER — Encounter: Payer: Self-pay | Admitting: Internal Medicine

## 2016-05-09 ENCOUNTER — Ambulatory Visit (AMBULATORY_SURGERY_CENTER): Payer: BLUE CROSS/BLUE SHIELD | Admitting: Internal Medicine

## 2016-05-09 VITALS — BP 148/68 | HR 73 | Temp 99.1°F | Resp 16 | Ht 67.0 in | Wt 226.0 lb

## 2016-05-09 DIAGNOSIS — K635 Polyp of colon: Secondary | ICD-10-CM

## 2016-05-09 DIAGNOSIS — Z86010 Personal history of colonic polyps: Secondary | ICD-10-CM

## 2016-05-09 DIAGNOSIS — K5100 Ulcerative (chronic) pancolitis without complications: Secondary | ICD-10-CM

## 2016-05-09 DIAGNOSIS — K6389 Other specified diseases of intestine: Secondary | ICD-10-CM | POA: Diagnosis not present

## 2016-05-09 DIAGNOSIS — D123 Benign neoplasm of transverse colon: Secondary | ICD-10-CM

## 2016-05-09 DIAGNOSIS — D120 Benign neoplasm of cecum: Secondary | ICD-10-CM

## 2016-05-09 DIAGNOSIS — D126 Benign neoplasm of colon, unspecified: Secondary | ICD-10-CM

## 2016-05-09 DIAGNOSIS — Z800 Family history of malignant neoplasm of digestive organs: Secondary | ICD-10-CM | POA: Diagnosis not present

## 2016-05-09 MED ORDER — SODIUM CHLORIDE 0.9 % IV SOLN: 500.0000 mL | INTRAVENOUS | Status: DC

## 2016-05-09 NOTE — Op Note (Signed)
[place] Patient Name: Lindsay Fry Procedure Date: 05/09/2016 8:56 AM MRN: [id number] Endoscopist: Docia Chuck. Henrene Pastor , MD Age: 56 Referring MD:  Date of Birth: 21-Jan-1960 Gender: Female Account #: [id number] Procedure:                Colonoscopy, with multiple biopsies and cold snare                            polypectomy X2 Indications:              High risk colon cancer surveillance: Personal                            history of multiple (3 or more) adenomasher last                            exam 1 year ago. Multiple adenomas. History of                            chronic ulcerative colitis. Colon cancer. Medicines:                Monitored Anesthesia Care Procedure:                Pre-Anesthesia Assessment:                           - Prior to the procedure, a History and Physical                            was performed, and patient medications and                            allergies were reviewed. The patient's tolerance of                            previous anesthesia was also reviewed. The risks                            and benefits of the procedure and the sedation                            options and risks were discussed with the patient.                            All questions were answered, and informed consent                            was obtained. Prior Anticoagulants: The patient has                            taken Brilinta. Last dose one week ago. ASA Grade                            Assessment: III - A patient with severe systemic  disease. After reviewing the risks and benefits,                            the patient was deemed in satisfactory condition to                            undergo the procedure.                           After obtaining informed consent, the colonoscope                            was passed under direct vision. Throughout the                            procedure, the patient's blood  pressure, pulse, and                            oxygen saturations were monitored continuously. The                            Colonoscope was introduced through the anus and                            advanced to the the cecum, identified by                            appendiceal orifice and ileocecal valve. The                            ileocecal valve, appendiceal orifice, and rectum                            were photographed. The quality of the bowel                            preparation was good. The colonoscopy was performed                            without difficulty. The patient tolerated the                            procedure well. The bowel preparation used was                            SUPREP. Scope In: 9:05:01 AM Scope Out: 9:38:42 AM Scope Withdrawal Time: 0 hours 19 minutes 18 seconds  Total Procedure Duration: 0 hours 33 minutes 41 seconds  Findings:                 Two polyps were found in the transverse colon and                            cecum. The polyps were 5 mm in size. These polyps  were removed with a cold snare. Resection and                            retrieval were complete.                           The entire examined colon appeared slightly                            hyperemic in a patchy fashion without friability or                            ulceration. Exam was otherwise normal on direct and                            retroflexion views. Four biopsies were taken every                            10 cm with a cold forceps from the entire colon for                            ulcerative colitis surveillance. These biopsy                            specimens from the entire colon were sent to                            Pathology. Complications:            No immediate complications. Estimated blood loss:                            None. Estimated Blood Loss:     Estimated blood loss: none. Impression:               - Two 5  mm polyps in the transverse colon and in                            the cecum, removed with a cold snare. Resected and                            retrieved.                           - The entire examined colon is normal on direct and                            retroflexion views. Recommendation:           - Repeat colonoscopy in 1 year for surveillance.                           - Resume Brilinta today at prior dose.                           - Patient  has a contact number available for                            emergencies. The signs and symptoms of potential                            delayed complications were discussed with the                            patient. Return to normal activities tomorrow.                            Written discharge instructions were provided to the                            patient.                           - Resume previous diet.                           - Continue present medications.                           - Await pathology results.                           - Please make an office appointment with Dr. Henrene Pastor                            in 6-8 weeks Docia Chuck. Henrene Pastor, MD 05/09/2016 9:53:29 AM This report has been signed electronically.

## 2016-05-09 NOTE — Patient Instructions (Addendum)
YOU HAD AN ENDOSCOPIC PROCEDURE TODAY AT [facility]:   Refer to the procedure report that was given to you for any specific questions about what was found during the examination.  If the procedure report does not answer your questions, please call your gastroenterologist to clarify.  If you requested that your care partner not be given the details of your procedure findings, then the procedure report has been included in a sealed envelope for you to review at your convenience later.  YOU SHOULD EXPECT: Some feelings of bloating in the abdomen. Passage of more gas than usual.  Walking can help get rid of the air that was put into your GI tract during the procedure and reduce the bloating. If you had a lower endoscopy (such as a colonoscopy or flexible sigmoidoscopy) you may notice spotting of blood in your stool or on the toilet paper. If you underwent a bowel prep for your procedure, you may not have a normal bowel movement for a few days.  Please Note:  You might notice some irritation and congestion in your nose or some drainage.  This is from the oxygen used during your procedure.  There is no need for concern and it should clear up in a day or so.  SYMPTOMS TO REPORT IMMEDIATELY:   Following lower endoscopy (colonoscopy or flexible sigmoidoscopy):  Excessive amounts of blood in the stool  Significant tenderness or worsening of abdominal pains  Swelling of the abdomen that is new, acute  Fever of 100F or higher   Following upper endoscopy (EGD)  Vomiting of blood or coffee ground material  New chest pain or pain under the shoulder blades  Painful or persistently difficult swallowing  New shortness of breath  Fever of 100F or higher  Black, tarry-looking stools  For urgent or emergent issues, a gastroenterologist can be reached at any hour by calling [phone].   DIET:  We do recommend a small meal at first, but then you may proceed to your regular diet.  Drink  plenty of fluids but you should avoid alcoholic beverages for 24 hours.  ACTIVITY:  You should plan to take it easy for the rest of today and you should NOT DRIVE or use heavy machinery until tomorrow (because of the sedation medicines used during the test).    FOLLOW UP: Our staff will call the number listed on your records the next business day following your procedure to check on you and address any questions or concerns that you may have regarding the information given to you following your procedure. If we do not reach you, we will leave a message.  However, if you are feeling well and you are not experiencing any problems, there is no need to return our call.  We will assume that you have returned to your regular daily activities without incident.  If any biopsies were taken you will be contacted by phone or by letter within the next 1-3 weeks.  Please call us at [phone] if you have not heard about the biopsies in 3 weeks.    SIGNATURES/CONFIDENTIALITY: You and/or your care partner have signed paperwork which will be entered into your electronic medical record.  These signatures attest to the fact that that the information above on your After Visit Summary has been reviewed and is understood.  Full responsibility of the confidentiality of this discharge information lies with you and/or your care-partner.YOU HAD AN ENDOSCOPIC PROCEDURE TODAY AT [facility]:  Refer to the procedure report that was given to you for any specific questions about what was found during the examination.  If the procedure report does not answer your questions, please call your gastroenterologist to clarify.  If you requested that your care partner not be given the details of your procedure findings, then the procedure report has been included in a sealed envelope for you to review at your convenience later.  YOU SHOULD EXPECT: Some feelings of bloating in the abdomen. Passage of more gas than  usual.  Walking can help get rid of the air that was put into your GI tract during the procedure and reduce the bloating. If you had a lower endoscopy (such as a colonoscopy or flexible sigmoidoscopy) you may notice spotting of blood in your stool or on the toilet paper. If you underwent a bowel prep for your procedure, you may not have a normal bowel movement for a few days.  Please Note:  You might notice some irritation and congestion in your nose or some drainage.  This is from the oxygen used during your procedure.  There is no need for concern and it should clear up in a day or so.  SYMPTOMS TO REPORT IMMEDIATELY:   Following lower endoscopy (colonoscopy or flexible sigmoidoscopy):  Excessive amounts of blood in the stool  Significant tenderness or worsening of abdominal pains  Swelling of the abdomen that is new, acute  Fever of 100F or higher    For urgent or emergent issues, a gastroenterologist can be reached at any hour by calling [phone].   DIET:  We do recommend a small meal at first, but then you may proceed to your regular diet.  Drink plenty of fluids but you should avoid alcoholic beverages for 24 hours.  ACTIVITY:  You should plan to take it easy for the rest of today and you should NOT DRIVE or use heavy machinery until tomorrow (because of the sedation medicines used during the test).    FOLLOW UP: Our staff will call the number listed on your records the next business day following your procedure to check on you and address any questions or concerns that you may have regarding the information given to you following your procedure. If we do not reach you, we will leave a message.  However, if you are feeling well and you are not experiencing any problems, there is no need to return our call.  We will assume that you have returned to your regular daily activities without incident.  If any biopsies were taken you will be contacted by phone or by letter within the  next 1-3 weeks.  Please call us at [phone] if you have not heard about the biopsies in 3 weeks.    SIGNATURES/CONFIDENTIALITY: You and/or your care partner have signed paperwork which will be entered into your electronic medical record.  These signatures attest to the fact that that the information above on your After Visit Summary has been reviewed and is understood.  Full responsibility of the confidentiality of this discharge information lies with you and/or your care-partner. RESUME BRILINTA TODAY AT PRIOR DOSE  AWAIT PATHOLOGY RESULTS ON BIOPSIES AND POLYPS REMOVED IN A LETTER FROM DR PERRY.  INFORMATION ON POLYPS  GIVEN TO YOU TODAY   MAKE OFFICE APPOINTMENT W/ DR Henrene Pastor FOR 6-8 WEEKS

## 2016-05-09 NOTE — Progress Notes (Signed)
Called to room to assist during endoscopic procedure.  Patient ID and intended procedure confirmed with present staff. Received instructions for my participation in the procedure from the performing physician.  

## 2016-05-09 NOTE — Progress Notes (Signed)
Patient awakening,vss,report to rn 

## 2016-05-10 ENCOUNTER — Telehealth: Payer: Self-pay | Admitting: *Deleted

## 2016-05-10 NOTE — Telephone Encounter (Signed)
  Follow up Call-  Call back number 05/09/2016 03/02/2015 03/08/2014  Post procedure Call Back phone  # [phone]  Permission to leave phone message Yes Yes Yes  Some recent data might be hidden     Patient questions:  Do you have a fever, pain , or abdominal swelling? No. Pain Score  0 *  Have you tolerated food without any problems? Yes.    Have you been able to return to your normal activities? Yes.    Do you have any questions about your discharge instructions: Diet   No. Medications  No. Follow up visit  No.  Do you have questions or concerns about your Care? No.  Actions: * If pain score is 4 or above: No action needed, pain <4.

## 2016-05-10 NOTE — Telephone Encounter (Signed)
No answer, left message to call if questions or concerns. 

## 2016-05-14 ENCOUNTER — Encounter: Payer: Self-pay | Admitting: Internal Medicine

## 2016-05-16 ENCOUNTER — Ambulatory Visit (INDEPENDENT_AMBULATORY_CARE_PROVIDER_SITE_OTHER): Payer: BLUE CROSS/BLUE SHIELD | Admitting: Psychology

## 2016-05-16 DIAGNOSIS — F3131 Bipolar disorder, current episode depressed, mild: Secondary | ICD-10-CM

## 2016-05-20 ENCOUNTER — Encounter: Payer: Self-pay | Admitting: Endocrinology

## 2016-05-20 ENCOUNTER — Other Ambulatory Visit: Payer: Self-pay

## 2016-05-20 DIAGNOSIS — F3132 Bipolar disorder, current episode depressed, moderate: Secondary | ICD-10-CM | POA: Diagnosis not present

## 2016-05-20 DIAGNOSIS — F3111 Bipolar disorder, current episode manic without psychotic features, mild: Secondary | ICD-10-CM | POA: Diagnosis not present

## 2016-05-20 MED ORDER — BAYER CONTOUR NEXT EZ W/DEVICE KIT: PACK | 2 refills | Status: DC

## 2016-05-20 MED ORDER — BAYER MICROLET LANCETS MISC: 2 refills | Status: DC

## 2016-05-20 MED ORDER — GLUCOSE BLOOD VI STRP: ORAL_STRIP | 2 refills | Status: DC

## 2016-05-21 ENCOUNTER — Other Ambulatory Visit (INDEPENDENT_AMBULATORY_CARE_PROVIDER_SITE_OTHER): Payer: BLUE CROSS/BLUE SHIELD

## 2016-05-21 DIAGNOSIS — D229 Melanocytic nevi, unspecified: Secondary | ICD-10-CM | POA: Diagnosis not present

## 2016-05-21 DIAGNOSIS — D1800 Hemangioma unspecified site: Secondary | ICD-10-CM | POA: Diagnosis not present

## 2016-05-21 DIAGNOSIS — E1165 Type 2 diabetes mellitus with hyperglycemia: Secondary | ICD-10-CM

## 2016-05-21 DIAGNOSIS — Z1283 Encounter for screening for malignant neoplasm of skin: Secondary | ICD-10-CM | POA: Diagnosis not present

## 2016-05-21 DIAGNOSIS — D485 Neoplasm of uncertain behavior of skin: Secondary | ICD-10-CM | POA: Diagnosis not present

## 2016-05-21 DIAGNOSIS — E890 Postprocedural hypothyroidism: Secondary | ICD-10-CM | POA: Diagnosis not present

## 2016-05-21 LAB — COMPREHENSIVE METABOLIC PANEL
ALT: 82 U/L — ABNORMAL HIGH (ref 0–35)
AST: 64 U/L — ABNORMAL HIGH (ref 0–37)
Albumin: 4.7 g/dL (ref 3.5–5.2)
Alkaline Phosphatase: 127 U/L — ABNORMAL HIGH (ref 39–117)
BUN: 14 mg/dL (ref 6–23)
CO2: 28 mEq/L (ref 19–32)
Calcium: 10.6 mg/dL — ABNORMAL HIGH (ref 8.4–10.5)
Chloride: 102 mEq/L (ref 96–112)
Creatinine, Ser: 0.85 mg/dL (ref 0.40–1.20)
GFR: 73.06 mL/min (ref >60.00)
Glucose, Bld: 141 mg/dL — ABNORMAL HIGH (ref 70–99)
Potassium: 4.1 mEq/L (ref 3.5–5.1)
Sodium: 139 mEq/L (ref 135–145)
Total Bilirubin: 0.5 mg/dL (ref 0.2–1.2)
Total Protein: 7.6 g/dL (ref 6.0–8.3)

## 2016-05-21 LAB — HEMOGLOBIN A1C: Hgb A1c MFr Bld: 6.8 % — ABNORMAL HIGH (ref 4.6–6.5)

## 2016-05-21 LAB — TSH: TSH: 0.27 u[IU]/mL — ABNORMAL LOW (ref 0.35–4.50)

## 2016-05-22 ENCOUNTER — Ambulatory Visit (INDEPENDENT_AMBULATORY_CARE_PROVIDER_SITE_OTHER): Payer: BLUE CROSS/BLUE SHIELD | Admitting: Endocrinology

## 2016-05-22 ENCOUNTER — Encounter: Payer: Self-pay | Admitting: Endocrinology

## 2016-05-22 VITALS — BP 150/100 | HR 97 | Ht 67.0 in | Wt 226.2 lb

## 2016-05-22 DIAGNOSIS — E1165 Type 2 diabetes mellitus with hyperglycemia: Secondary | ICD-10-CM

## 2016-05-22 DIAGNOSIS — Z794 Long term (current) use of insulin: Secondary | ICD-10-CM

## 2016-05-22 DIAGNOSIS — R945 Abnormal results of liver function studies: Secondary | ICD-10-CM

## 2016-05-22 DIAGNOSIS — K7689 Other specified diseases of liver: Secondary | ICD-10-CM

## 2016-05-22 DIAGNOSIS — E890 Postprocedural hypothyroidism: Secondary | ICD-10-CM | POA: Diagnosis not present

## 2016-05-22 NOTE — Progress Notes (Signed)
Patient ID: Lindsay Fry, female   DOB: 10/02/1960, 56 y.o.   MRN: [id number]   Reason for Appointment:  Hypothyroidism and diabetes, followup visit  History of Present Illness:  DIABETES:  Prior history: With her high A1c of 6.7 in 03/2014 she had an abnormal glucose tolerance test indicating diabetes and 1 hour glucose of 300 She was started on metformin; however even with 1500 mg of metformin ER  blood sugars were not controlled especially fasting Subsequently with taking Janumet XR 100/1000 daily her blood sugars were much better Because of her weight gain and A1c going up to 7.3 along with occasional readings over 200 she was started on Victoza in 11/16 She has had progressive hyperglycemia previously and A1c was up to 8.6% in 4/17   Non-insulin hypoglycemic drugs: Victoza 1.8 mg daily, metformin ER 2000 mg daily, Invokana 300 mg daily  INSULIN regimen: Levemir 36 units at bedtime  A1c is recently 6.6, previously 6.8  Current management, blood sugar patterns and problems identified:  She was started on Levemir insulin in 08/2015 because of progressive rise in blood sugars and inability to control with 3 other agents  She has increased the dose up to 36 units from the previous dose of 32 at night since her sugars were relatively higher in the morning  Since her last visit she thinks she is getting more sweet drinks as she does not tolerate diet drinks and is having regular Cokes,  Sprite and sweet tea at least once or twice a day  She checks her blood sugars usually around midday when she is actually getting up although this is variable  Recent FASTING readings range from 99-1 46, median 114  She has not done any readings after supper, afternoon readings are mildly increased, reading 159, 168  She still tries to watch her diet and avoid sweets and high-fat or high carbohydrate meals without watching points  Trying to exercise regularly also  GLUCOSE readings  from review of One Touch Verio monitor as above:   Weight history:  Wt Readings from Last 3 Encounters:  05/22/16 226 lb 3.2 oz (102.6 kg)  05/09/16 226 lb (102.5 kg)  03/22/16 225 lb (102.1 kg)   Previous consultation with dietitian: none   Exercise regimen: aquatic exercises 3/7 days    LABS:  Lab Results  Component Value Date   HGBA1C 6.8 (H) 05/21/2016   HGBA1C 6.6 (H) 02/08/2016   HGBA1C 7.3 09/29/2015   Lab Results  Component Value Date   MICROALBUR 13.3 (H) 11/10/2015   LDLCALC 65 10/13/2014   CREATININE 0.85 05/21/2016     HYPOTHYROIDISM: This was first diagnosed in 1992 after her treatment for Graves' disease with I-131 She has been on relatively large doses of thyroxine supplements She was on 224 mcg since 11/13 and her TSH was normal in 3/14 Subjectively difficult to assess her thyroid since she tends to have fatigue chronically.  In 2014 her dose was reduced to 200 mcg daily and her dose has been fluctuating since then She had required periodic increase in dosage in 05/2014 when her TSH was 20  She is  on 2 tablets of 137 g daily, continues on the brand name Synthroid  Her symptoms are usually nonspecific for hypothyroidism, mostly related to other medical problems including sleep disturbances and bipolar illness.  No unusual fatigue recently  She has been quite regular with the Synthroid in the mornings         TSH is  trending lower over the last 3 measurements and is now 0.27   Lab Results  Component Value Date   TSH 0.27 (L) 05/21/2016   TSH 0.85 03/22/2016   TSH 1.42 11/10/2015   FREET4 0.96 11/10/2015   FREET4 1.11 04/25/2015   FREET4 1.24 01/25/2015     Lab on 05/21/2016  Component Date Value Ref Range Status  . Hgb A1c MFr Bld 05/21/2016 6.8* 4.6 - 6.5 % Final  . Sodium 05/21/2016 139  135 - 145 mEq/L Final  . Potassium 05/21/2016 4.1  3.5 - 5.1 mEq/L Final  . Chloride 05/21/2016 102  96 - 112 mEq/L Final  . CO2 05/21/2016 28  19 -  32 mEq/L Final  . Glucose, Bld 05/21/2016 141* 70 - 99 mg/dL Final  . BUN 05/21/2016 14  6 - 23 mg/dL Final  . Creatinine, Ser 05/21/2016 0.85  0.40 - 1.20 mg/dL Final  . Total Bilirubin 05/21/2016 0.5  0.2 - 1.2 mg/dL Final  . Alkaline Phosphatase 05/21/2016 127* 39 - 117 U/L Final  . AST 05/21/2016 64* 0 - 37 U/L Final  . ALT 05/21/2016 82* 0 - 35 U/L Final  . Total Protein 05/21/2016 7.6  6.0 - 8.3 g/dL Final  . Albumin 05/21/2016 4.7  3.5 - 5.2 g/dL Final  . Calcium 05/21/2016 10.6* 8.4 - 10.5 mg/dL Final  . GFR 05/21/2016 73.06  >60.00 mL/min Final  . TSH 05/21/2016 0.27* 0.35 - 4.50 uIU/mL Final    Allergies as of 05/22/2016      Reactions   Ephedrine Other (See Comments)   Pt becomes hyper   Aspartame And Phenylalanine Nausea Only   Aspirin    REACTION: aggrivates colitis   Crestor [rosuvastatin]    REACTION: increased lfts   Erythromycin    REACTION: GI upset   Nsaids    REACTION: aggrivate colitis   Oxycodone Other (See Comments)   Panic Attack      Medication List       Accurate as of 05/22/16  3:37 PM. Always use your most recent med list.          aspirin 81 MG tablet Take 81 mg by mouth daily.   atorvastatin 80 MG tablet Commonly known as:  LIPITOR TAKE 1 TABLET(80 MG) BY MOUTH DAILY   b complex vitamins capsule Take 1 capsule by mouth daily.   BAYER MICROLET LANCETS lancets USE AS DIRECTED TO CHECK BLOOD SUGAR TWICE DAILY   BYSTOLIC 10 MG tablet Generic drug:  nebivolol TAKE 1 TABLET BY MOUTH IN THE MORNING AND 2 TABLETS AFTER LUNCH   CARTIA XT 240 MG 24 hr capsule Generic drug:  diltiazem TAKE 1 CAPSULE(240 MG) BY MOUTH DAILY   chlorproMAZINE 25 MG tablet Commonly known as:  THORAZINE Take 25-50 mg by mouth as needed. Reported on 03/02/2015   cloNIDine 0.1 MG tablet Commonly known as:  CATAPRES Take 1 tablet (0.1 mg total) by mouth 2 (two) times daily.   co-enzyme Q-10 50 MG capsule Take 50 mg by mouth daily.   CONTOUR NEXT EZ MONITOR  w/Device Kit USE AS DIRECTED TO CHECK BLOOD SUGAR TWICE DAILY   diazepam 10 MG tablet Commonly known as:  VALIUM Take 2.5-10 mg by mouth as needed for anxiety.   EFFEXOR XR 37.5 MG 24 hr capsule Generic drug:  venlafaxine XR Take 37.5 mg by mouth. Takes 3 capsules daily   fluticasone 50 MCG/ACT nasal spray Commonly known as:  FLONASE USE 2 SPRAYS IN EACH NOSTRIL TWICE  DAILY   gabapentin 300 MG capsule Commonly known as:  NEURONTIN TAKE 1 CAPSULE(300 MG) BY MOUTH THREE TIMES DAILY   GAS-X PO Take 1 tablet by mouth 4 (four) times daily.   glucose blood test strip Commonly known as:  BAYER CONTOUR NEXT TEST USE AS DIRECTED TO CHECK BLOOD SUGAR TWICE DAILY   Insulin Detemir 100 UNIT/ML Pen Commonly known as:  LEVEMIR Inject 32 Units into the skin daily at 10 pm.   Insulin Pen Needle 31G X 5 MM Misc Commonly known as:  B-D UF III MINI PEN NEEDLES Use to inject 2 times per day.   INVOKANA 300 MG Tabs tablet Generic drug:  canagliflozin TAKE 1 TABLET(300 MG) BY MOUTH DAILY BEFORE BREAKFAST   lamoTRIgine 200 MG tablet Commonly known as:  LAMICTAL Take 400 mg by mouth daily.   latanoprost 0.005 % ophthalmic solution Commonly known as:  XALATAN Place 1 drop into both eyes At bedtime.   letrozole 2.5 MG tablet Commonly known as:  FEMARA Take 1 tablet (2.5 mg total) by mouth daily.   lithium 300 MG tablet Take 300 mg by mouth daily. Takes at bedtime   Melatonin 10 MG Caps Take 1 capsule by mouth at bedtime.   mesalamine 1.2 g EC tablet Commonly known as:  LIALDA TAKE 1 TABLET BY MOUTH EVERY DAY WITH BREAKFAST   metFORMIN 500 MG 24 hr tablet Commonly known as:  GLUCOPHAGE-XR TAKE 4 TABLETS BY MOUTH DAILY   multivitamin tablet Take 1 tablet by mouth daily.   nitrofurantoin 100 MG capsule Commonly known as:  MACRODANTIN Take 100 mg by mouth as directed. Reported on 03/02/2015   Omega-3 Krill Oil 500 MG Caps Take 500 mg by mouth.   ondansetron 4 MG  tablet Commonly known as:  ZOFRAN Take 1 tablet (4 mg total) by mouth every 8 (eight) hours as needed for nausea or vomiting.   pantoprazole 40 MG tablet Commonly known as:  PROTONIX TAKE 1 TABLET BY MOUTH TWICE DAILY   PROBIOTIC DAILY PO Take by mouth.   ranitidine 300 MG tablet Commonly known as:  ZANTAC Take 0.5 tablets (150 mg total) by mouth 2 (two) times daily.   SALONPAS PAIN RELIEF PATCH EX Apply topically as needed. Reported on 03/02/2015   SAPHRIS 10 MG Subl Generic drug:  Asenapine Maleate Place 20 mg under the tongue at bedtime.   SYNTHROID 137 MCG tablet Generic drug:  levothyroxine TAKE 2 TABLETS BY MOUTH DAILY BEFORE BREAKFAST.   ticagrelor 60 MG Tabs tablet Commonly known as:  BRILINTA Take 1 tablet (60 mg total) by mouth 2 (two) times daily.   traZODone 50 MG tablet Commonly known as:  DESYREL Take 25 mg by mouth at bedtime.   valACYclovir 500 MG tablet Commonly known as:  VALTREX Take 1 tablet (500 mg total) by mouth 2 (two) times daily.   VICTOZA 18 MG/3ML Sopn Generic drug:  liraglutide INJECT 1.8 MG UNDER THE SKIN DAILY AT THE SAME TIME EVERY DAY   vitamin C 500 MG tablet Commonly known as:  ASCORBIC ACID Take 250 mg by mouth daily. Takes 2 a day   Vitamin D3 2000 units Tabs Take 1 tablet by mouth daily.       Allergies:  Allergies  Allergen Reactions  . Ephedrine Other (See Comments)    Pt becomes hyper  . Aspartame And Phenylalanine Nausea Only  . Aspirin     REACTION: aggrivates colitis  . Crestor [Rosuvastatin]     REACTION: increased  lfts  . Erythromycin     REACTION: GI upset  . Nsaids     REACTION: aggrivate colitis  . Oxycodone Other (See Comments)    Panic Attack    Past Medical History:  Diagnosis Date  . ADD (attention deficit disorder) 11/19/2012  . Allergic rhinitis, cause unspecified   . Anxiety state, unspecified   . Arthritis   . Asymptomatic postmenopausal status (age-related) (natural)   . Atypical  hyperplasia of left breast 2000  . Benign neoplasm of other and unspecified site of the digestive system   . Bipolar disorder ([place])   . Breast cancer ([place])    Left Breast 67m invasive CA left uiq, dcis left uoq and a lot ADH, ALH in both breasts.  .Marland KitchenCAD (coronary artery disease)    a. 08/2014 NSTEMI/Cath: LM nl, LAD 50p, D1/D2 min irregs, LCX nl, OM1 40, LPDA nl, RI 99ost (2.026mvessel->med Rx), RCA nl, AM 60, nl EF.  . Carcinoma of left breast ([place]/19/2016  . Chronic diastolic CHF (congestive heart failure) ([place]   a. 08/2014 Echo: EF 60-65%, Gr 1 DD, nl LA.  . Marland Kitchenyst    on Achilles tendon  . Depression   . Diabetes mellitus ([place]   frank  . Dysuria   . Edema   . Family history of malignant neoplasm of gastrointestinal tract   . Fibromyalgia   . GERD (gastroesophageal reflux disease)   . Glaucoma    pre glaucoma  . Headache    migraines  . Hiatal hernia   . History of alcoholism ([place]  . History of migraines   . History of ovarian cyst   . Insomnia, unspecified   . Myocardial infarction ([place]  . OCD (obsessive compulsive disorder)   . Other screening mammogram   . Pure hypercholesterolemia   . Rosacea   . Subacute confusional state 11/19/2012  . Tubular adenoma of colon 01/10/11  . Ulcerative colitis, unspecified   . Unspecified asthma(493.90)   . Unspecified essential hypertension   . Unspecified hypothyroidism   . Unspecified vitamin D deficiency   . Vertigo     Past Surgical History:  Procedure Laterality Date  . APPENDECTOMY    . AXILLARY SENTINEL NODE BIOPSY Left 05/23/2014   Procedure: AXILLARY SENTINEL NODE BIOPSY;  Surgeon: SeChristene LyeMD;  Location: [facility];  Service: General;  Laterality: Left;  . BREAST BIOPSY  Aug 2000   on tamoxifen, atypical hyperplasia  . BREAST SURGERY Left Aug 2000   lumpectomy/ Dr CrSharlet Salina. BREAST SURGERY Bilateral 05/23/14   Mastectomy  . BUNIONECTOMY Right   . CARDIAC CATHETERIZATION N/A 09/05/2014    Procedure: Left Heart Cath and Coronary Angiography;  Surgeon: MuWellington HampshireMD;  [facility];  Service: Cardiovascular;  Laterality: N/A;  . CARDIAC CATHETERIZATION  09-05-14  . CESAREAN SECTION  1996/1997   placenta previa, gest DM, pre-eclampsia  . cholecystect  1999  . CHOLECYSTECTOMY  1997   adhesions also  . COLONOSCOPY  01/2001   Ulcerative colitis  . COLONOSCOPY  04/2004   UC, polyp  . COLONOSCOPY  12/08   UC, no polyps  . DEXA  04/1999 and 2010   normal  . ESOPHAGOGASTRODUODENOSCOPY  09/2001   polyp  . exercise stress test  11/2003   negative  . FEMUR FRACTURE SURGERY Left   . MASTECTOMY Bilateral 05-23-14   Dr. SaJamal Collin. MECKEL DIVERTICULUM EXCISION  1999  . NASAL SINUS  SURGERY  05/1997  . nuclear stress test  06/2007   negative  . precancerous mole removed    . SIMPLE MASTECTOMY WITH AXILLARY SENTINEL NODE BIOPSY Bilateral 05/23/2014   Procedure: Bilateral simple mastectomy, left sentinel node biopsy ;  Surgeon: Christene Lye, MD;  Location: [facility] ORS;  Service: General;  Laterality: Bilateral;  . Sleep study  11/09   no apnea, but did snore (done by [facility])  . TONSILLECTOMY  1984  . TUBAL LIGATION    . WISDOM TOOTH EXTRACTION  1980's    Family History  Problem Relation Age of Onset  . Coronary artery disease Father   . Colon cancer Father   . Alzheimer's disease Father   . Dementia Father   . Coronary artery disease Mother   . Hypertension Mother   . Osteoporosis Mother   . Colitis Mother   . Crohn's disease Mother   . Breast cancer      great aunts  . Coronary artery disease      Uncle (also AAA)  . Diabetes      remote family history  . Stroke Cousin   . Esophageal cancer Neg Hx   . Rectal cancer Neg Hx   . Stomach cancer Neg Hx     Social History:  reports that she quit smoking about 23 years ago. Her smoking use included Cigarettes. She has a 1.25 pack-year smoking history. She has quit using smokeless tobacco. She  reports that she does not drink alcohol or use drugs.  REVIEW Of SYSTEMS:   HYPERCALCEMIA: Her calcium has been Mildly increased, Slightly higher now PTH in the past has been borderline for hyperparathyroidism She is going to get a bone density through her oncologist  Calcium supplements had been 1 daily She is taking OTC vitamin D3, 2000 units   Lab Results  Component Value Date   PTH 22 05/25/2015   CALCIUM 10.6 (H) 05/21/2016   PHOS 2.3 01/29/2007     Lab Results  Component Value Date   CALCIUM 10.6 (H) 05/21/2016   PHOS 2.3 01/29/2007   Lab Results  Component Value Date   VD25OH 44 05/12/2013   VD25OH 47 05/19/2012   VD25OH 42 04/23/2011     HYPERTENSION: Blood pressure is controlled, managed by cardiologist.   Currently on low-dose clonidine, Bystolic twice a day and diltiazem    Lab Results  Component Value Date   CREATININE 0.85 05/21/2016   BUN 14 05/21/2016   NA 139 05/21/2016   K 4.1 05/21/2016   CL 102 05/21/2016   CO2 28 05/21/2016    History of hypercholesterolemia: Previously on 20 mg Lipitor now 80 mg since her MI   Lab Results  Component Value Date   CHOL 121 11/10/2015   HDL 36.50 (L) 11/10/2015   LDLCALC 65 10/13/2014   LDLDIRECT 67.0 11/10/2015   TRIG 217.0 (H) 11/10/2015   CHOLHDL 3 11/10/2015    Her gastroenterologist feels that ALT is high from fatty liver or colitis   Lab Results  Component Value Date   ALT 82 (H) 05/21/2016      Examination:   BP (!) 150/100   Pulse 97   Ht '5\' 7"'$  (1.702 m)   Wt 226 lb 3.2 oz (102.6 kg)   LMP 08/23/2006 (Approximate)   SpO2 96%   BMI 35.43 kg/m    Her face is cushingoid     Assessments   DIABETES:  See history of present illness for detailed discussion of  his current management, blood sugar patterns and problems identified  Her blood sugars are reasonably well controlled with A1c 6.8 However this is going up gradually and most likely her sugars are higher from getting into  sweet drinks now Has not checked readings after meals lately Weight is about the same and she is trying to exercise  Recommendations today:  No change in Basal insulin   She does need to start checking sugars after supper at night  She'll try to cut back on drinks which sugar and she could try using some coffee instead of sweet tea during the day  Check coverage for Ozempic  Hypothyroidism, post ablative: Subjectively she is doing fairly well now TSH has been pending lower and now 0.27 She will skip 1 tablet weekly now  HYPERCALCEMIA: May have mild hyperparathyroidism and will recheck her PTH on the next visit  Patient Instructions  Check coverage for Ozempic  Less sweet drinks  Reduce Synthroid 1 pill weekly  Check blood sugars on waking up  daily  Also check blood sugars about 2 hours after a meal and do this after different meals by rotation  Recommended blood sugar levels on waking up is 90-130 and about 2 hours after meal is 130-160  Please bring your blood sugar monitor to each visit, thank you   Total visit time for evaluation and management of multiple problems, download and review of her glucose monitor, counseling = 25 minutes .  Aizah Gehlhausen 05/22/2016, 3:37 PM   Note: This office note was prepared with Dragon voice recognition system technology. Any transcriptional errors that result from this process are unintentional.

## 2016-05-22 NOTE — Patient Instructions (Addendum)
Check coverage for Ozempic  Less sweet drinks  Reduce Synthroid 1 pill weekly  Check blood sugars on waking up  daily  Also check blood sugars about 2 hours after a meal and do this after different meals by rotation  Recommended blood sugar levels on waking up is 90-130 and about 2 hours after meal is 130-160  Please bring your blood sugar monitor to each visit, thank you

## 2016-05-23 ENCOUNTER — Encounter: Payer: Self-pay | Admitting: Endocrinology

## 2016-05-28 ENCOUNTER — Encounter: Payer: Self-pay | Admitting: Family Medicine

## 2016-05-28 ENCOUNTER — Telehealth: Payer: Self-pay | Admitting: *Deleted

## 2016-05-28 ENCOUNTER — Encounter: Payer: Self-pay | Admitting: Internal Medicine

## 2016-05-28 DIAGNOSIS — E2839 Other primary ovarian failure: Secondary | ICD-10-CM

## 2016-05-28 NOTE — Telephone Encounter (Signed)
Left patient a message to call the office to see if she can come in earlier for her appointment on 05/29/16 with Dr. Jamal Collin.

## 2016-05-29 ENCOUNTER — Ambulatory Visit: Payer: Self-pay | Admitting: General Surgery

## 2016-05-30 ENCOUNTER — Other Ambulatory Visit: Payer: Self-pay | Admitting: Endocrinology

## 2016-05-30 DIAGNOSIS — E2839 Other primary ovarian failure: Secondary | ICD-10-CM | POA: Insufficient documentation

## 2016-05-30 NOTE — Telephone Encounter (Signed)
Ref for dexa due after7/6/18

## 2016-06-02 NOTE — Telephone Encounter (Signed)
Error

## 2016-06-03 ENCOUNTER — Encounter: Payer: Self-pay | Admitting: Endocrinology

## 2016-06-03 ENCOUNTER — Encounter: Payer: Self-pay | Admitting: Internal Medicine

## 2016-06-03 DIAGNOSIS — D0582 Other specified type of carcinoma in situ of left breast: Secondary | ICD-10-CM

## 2016-06-05 ENCOUNTER — Encounter: Payer: Self-pay | Admitting: Endocrinology

## 2016-06-06 ENCOUNTER — Other Ambulatory Visit: Payer: Self-pay | Admitting: Endocrinology

## 2016-06-06 MED ORDER — SEMAGLUTIDE(0.25 OR 0.5MG/DOS) 2 MG/1.5ML ~~LOC~~ SOPN: 0.5000 mg | PEN_INJECTOR | SUBCUTANEOUS | 3 refills | Status: DC

## 2016-06-07 ENCOUNTER — Ambulatory Visit (INDEPENDENT_AMBULATORY_CARE_PROVIDER_SITE_OTHER): Payer: BLUE CROSS/BLUE SHIELD | Admitting: Psychology

## 2016-06-07 DIAGNOSIS — F3131 Bipolar disorder, current episode depressed, mild: Secondary | ICD-10-CM

## 2016-06-08 ENCOUNTER — Other Ambulatory Visit: Payer: Self-pay | Admitting: Endocrinology

## 2016-06-11 ENCOUNTER — Ambulatory Visit: Payer: Self-pay | Admitting: General Surgery

## 2016-06-13 ENCOUNTER — Other Ambulatory Visit: Payer: Self-pay | Admitting: Family Medicine

## 2016-06-13 NOTE — Telephone Encounter (Signed)
Please refill for a year  

## 2016-06-13 NOTE — Telephone Encounter (Signed)
done

## 2016-06-13 NOTE — Telephone Encounter (Signed)
Last filled on 01/24/16 #90 caps with 3 additional refills, last OV was 03/19/16, please advise

## 2016-06-17 ENCOUNTER — Encounter: Payer: Self-pay | Admitting: Internal Medicine

## 2016-06-17 DIAGNOSIS — F3111 Bipolar disorder, current episode manic without psychotic features, mild: Secondary | ICD-10-CM | POA: Diagnosis not present

## 2016-06-17 DIAGNOSIS — F3131 Bipolar disorder, current episode depressed, mild: Secondary | ICD-10-CM | POA: Diagnosis not present

## 2016-06-19 ENCOUNTER — Encounter: Payer: Self-pay | Admitting: General Surgery

## 2016-06-19 ENCOUNTER — Ambulatory Visit (INDEPENDENT_AMBULATORY_CARE_PROVIDER_SITE_OTHER): Payer: BLUE CROSS/BLUE SHIELD | Admitting: General Surgery

## 2016-06-19 VITALS — BP 142/80 | HR 84 | Resp 14 | Ht 66.0 in | Wt 225.0 lb

## 2016-06-19 DIAGNOSIS — Z853 Personal history of malignant neoplasm of breast: Secondary | ICD-10-CM | POA: Diagnosis not present

## 2016-06-19 DIAGNOSIS — N6092 Unspecified benign mammary dysplasia of left breast: Secondary | ICD-10-CM

## 2016-06-19 DIAGNOSIS — N62 Hypertrophy of breast: Secondary | ICD-10-CM

## 2016-06-19 DIAGNOSIS — C50912 Malignant neoplasm of unspecified site of left female breast: Secondary | ICD-10-CM

## 2016-06-19 DIAGNOSIS — D0512 Intraductal carcinoma in situ of left breast: Secondary | ICD-10-CM

## 2016-06-19 DIAGNOSIS — D0582 Other specified type of carcinoma in situ of left breast: Secondary | ICD-10-CM | POA: Diagnosis not present

## 2016-06-19 DIAGNOSIS — N6091 Unspecified benign mammary dysplasia of right breast: Secondary | ICD-10-CM

## 2016-06-19 NOTE — Patient Instructions (Signed)
Follow up with Dr. Rogue Bussing in one year. Advised to call office with any questions or concerns.

## 2016-06-19 NOTE — Progress Notes (Signed)
Patient ID: Lindsay Fry, female   DOB: 30-Jun-1960, 56 y.o.   MRN: [id number]  Chief Complaint  Patient presents with  . Breast Cancer    HPI Lindsay Fry is a 56 y.o. female here today for her 6 months follow up breast cancer check. Patient states she is doing well. No new complaints. HPI  Past Medical History:  Diagnosis Date  . ADD (attention deficit disorder) 11/19/2012  . Allergic rhinitis, cause unspecified   . Anxiety state, unspecified   . Arthritis   . Asymptomatic postmenopausal status (age-related) (natural)   . Atypical hyperplasia of left breast 2000  . Benign neoplasm of other and unspecified site of the digestive system   . Bipolar disorder ([place])   . Breast cancer ([place])    Left Breast 63m invasive CA left uiq, dcis left uoq and a lot ADH, ALH in both breasts.  .Marland KitchenCAD (coronary artery disease)    a. 08/2014 NSTEMI/Cath: LM nl, LAD 50p, D1/D2 min irregs, LCX nl, OM1 40, LPDA nl, RI 99ost (2.056mvessel->med Rx), RCA nl, AM 60, nl EF.  . Carcinoma of left breast (HCO[place]5/19/2016  . Chronic diastolic CHF (congestive heart failure) ([facility]   a. 08/2014 Echo: EF 60-65%, Gr 1 DD, nl LA.  . Marland Kitchenyst    on Achilles tendon  . Depression   . Diabetes mellitus ([facility]   frank  . Dysuria   . Edema   . Family history of malignant neoplasm of gastrointestinal tract   . Fibromyalgia   . GERD (gastroesophageal reflux disease)   . Glaucoma    pre glaucoma  . Headache    migraines  . Hiatal hernia   . History of alcoholism ([facility] [place]  . History of migraines   . History of ovarian cyst   . Insomnia, unspecified   . Myocardial infarction ([id number]  . OCD (obsessive compulsive disorder)   . Other screening mammogram   . Pure hypercholesterolemia   . Rosacea   . Subacute confusional state 11/19/2012  . Tubular adenoma of colon 01/10/11  . Ulcerative colitis, unspecified   . Unspecified asthma(493.90)   . Unspecified essential hypertension   . Unspecified hypothyroidism    . Unspecified vitamin D deficiency   . Vertigo     Past Surgical History:  Procedure Laterality Date  . APPENDECTOMY    . AXILLARY SENTINEL NODE BIOPSY Left 05/23/2014   Procedure: AXILLARY SENTINEL NODE BIOPSY;  Surgeon: SeChristene LyeMD;  Location: [facility];  Service: General;  Laterality: Left;  . BREAST BIOPSY  Aug 2000   on tamoxifen, atypical hyperplasia  . BREAST SURGERY Left Aug 2000   lumpectomy/ Dr CrSharlet Salina. BREAST SURGERY Bilateral 05/23/14   Mastectomy  . BUNIONECTOMY Right   . CARDIAC CATHETERIZATION N/A 09/05/2014   Procedure: Left Heart Cath and Coronary Angiography;  Surgeon: MuWellington HampshireMD;  [facility];  Service: Cardiovascular;  Laterality: N/A;  . CARDIAC CATHETERIZATION  09-05-14  . CESAREAN SECTION  1996/1997   placenta previa, gest DM, pre-eclampsia  . cholecystect  1999  . CHOLECYSTECTOMY  1997   adhesions also  . COLONOSCOPY  01/2001   Ulcerative colitis  . COLONOSCOPY  04/2004   UC, polyp  . COLONOSCOPY  12/08   UC, no polyps  . DEXA  04/1999 and 2010   normal  . ESOPHAGOGASTRODUODENOSCOPY  09/2001   polyp  . exercise stress test  11/2003   negative  .  FEMUR FRACTURE SURGERY Left   . MASTECTOMY Bilateral 05-23-14   Dr. Jamal Collin  . MECKEL DIVERTICULUM EXCISION  1999  . NASAL SINUS SURGERY  05/1997  . nuclear stress test  06/2007   negative  . precancerous mole removed    . SIMPLE MASTECTOMY WITH AXILLARY SENTINEL NODE BIOPSY Bilateral 05/23/2014   Procedure: Bilateral simple mastectomy, left sentinel node biopsy ;  Surgeon: Christene Lye, MD;  Location: [facility] ORS;  Service: General;  Laterality: Bilateral;  . Sleep study  11/09   no apnea, but did snore (done by [facility])  . TONSILLECTOMY  1984  . TUBAL LIGATION    . WISDOM TOOTH EXTRACTION  1980's    Family History  Problem Relation Age of Onset  . Coronary artery disease Father   . Colon cancer Father   . Alzheimer's disease Father   . Dementia Father    . Coronary artery disease Mother   . Hypertension Mother   . Osteoporosis Mother   . Colitis Mother   . Crohn's disease Mother   . Breast cancer Unknown        great aunts  . Coronary artery disease Unknown        Uncle (also AAA)  . Diabetes Unknown        remote family history  . Stroke Cousin   . Esophageal cancer Neg Hx   . Rectal cancer Neg Hx   . Stomach cancer Neg Hx     Social History Social History  Substance Use Topics  . Smoking status: Former Smoker    Packs/day: 0.25    Years: 5.00    Types: Cigarettes    Quit date: 01/14/1993  . Smokeless tobacco: Former Systems developer  . Alcohol use No     Comment: Recovered ETOH    Allergies  Allergen Reactions  . Ephedrine Other (See Comments)    Pt becomes hyper  . Aspartame And Phenylalanine Nausea Only  . Aspirin     REACTION: aggrivates colitis  . Crestor [Rosuvastatin]     REACTION: increased lfts  . Erythromycin     REACTION: GI upset  . Nsaids     REACTION: aggrivate colitis  . Oxycodone Other (See Comments)    Panic Attack    Current Outpatient Prescriptions  Medication Sig Dispense Refill  . Asenapine Maleate (SAPHRIS) 10 MG SUBL Place 20 mg under the tongue at bedtime.    Marland Kitchen aspirin 81 MG tablet Take 81 mg by mouth daily.    Marland Kitchen atorvastatin (LIPITOR) 80 MG tablet TAKE 1 TABLET(80 MG) BY MOUTH DAILY 30 tablet 3  . b complex vitamins capsule Take 1 capsule by mouth daily.    Marland Kitchen BAYER MICROLET LANCETS lancets USE AS DIRECTED TO CHECK BLOOD SUGAR TWICE DAILY 200 each 2  . Blood Glucose Monitoring Suppl (CONTOUR NEXT EZ MONITOR) w/Device KIT USE AS DIRECTED TO CHECK BLOOD SUGAR TWICE DAILY 1 kit 2  . BYSTOLIC 10 MG tablet TAKE 1 TABLET BY MOUTH IN THE MORNING AND 2 TABLETS AFTER LUNCH 270 tablet 3  . CARTIA XT 240 MG 24 hr capsule TAKE 1 CAPSULE(240 MG) BY MOUTH DAILY 90 capsule 3  . chlorproMAZINE (THORAZINE) 25 MG tablet Take 25-50 mg by mouth as needed. Reported on 03/02/2015    . Cholecalciferol (VITAMIN D3) 2000  UNITS TABS Take 1 tablet by mouth daily.    . cloNIDine (CATAPRES) 0.1 MG tablet Take 1 tablet (0.1 mg total) by mouth 2 (two) times daily. 180 tablet  3  . co-enzyme Q-10 50 MG capsule Take 50 mg by mouth daily.    . diazepam (VALIUM) 10 MG tablet Take 2.5-10 mg by mouth as needed for anxiety.    . fluticasone (FLONASE) 50 MCG/ACT nasal spray USE 2 SPRAYS IN EACH NOSTRIL TWICE DAILY 16 g 5  . gabapentin (NEURONTIN) 300 MG capsule TAKE 1 CAPSULE(300 MG) BY MOUTH THREE TIMES DAILY 90 capsule 11  . glucose blood (BAYER CONTOUR NEXT TEST) test strip USE AS DIRECTED TO CHECK BLOOD SUGAR TWICE DAILY 200 each 2  . Insulin Detemir (LEVEMIR) 100 UNIT/ML Pen Inject 32 Units into the skin daily at 10 pm. (Patient taking differently: Inject 35 Units into the skin daily at 10 pm. Uses 35 to 36 units) 15 mL 2  . Insulin Pen Needle (B-D UF III MINI PEN NEEDLES) 31G X 5 MM MISC Use to inject 2 times per day. 100 each 2  . INVOKANA 300 MG TABS tablet TAKE 1 TABLET(300 MG) BY MOUTH DAILY BEFORE BREAKFAST 30 tablet 3  . lamoTRIgine (LAMICTAL) 200 MG tablet Take 400 mg by mouth daily.    Marland Kitchen latanoprost (XALATAN) 0.005 % ophthalmic solution Place 1 drop into both eyes At bedtime.    Marland Kitchen letrozole (FEMARA) 2.5 MG tablet Take 1 tablet (2.5 mg total) by mouth daily. 30 tablet 12  . Liniments (SALONPAS PAIN RELIEF PATCH EX) Apply topically as needed. Reported on 03/02/2015    . lithium 300 MG tablet Take 300 mg by mouth daily. Takes at bedtime    . Melatonin 10 MG CAPS Take 1 capsule by mouth at bedtime.    . mesalamine (LIALDA) 1.2 g EC tablet TAKE 1 TABLET BY MOUTH EVERY DAY WITH BREAKFAST 30 tablet 3  . metFORMIN (GLUCOPHAGE-XR) 500 MG 24 hr tablet TAKE 4 TABLETS BY MOUTH DAILY 120 tablet 0  . Multiple Vitamin (MULTIVITAMIN) tablet Take 1 tablet by mouth daily.      . nitrofurantoin (MACRODANTIN) 100 MG capsule Take 100 mg by mouth as directed. Reported on 03/02/2015    . Omega-3 Krill Oil 500 MG CAPS Take 500 mg by mouth.     . ondansetron (ZOFRAN) 4 MG tablet Take 1 tablet (4 mg total) by mouth every 8 (eight) hours as needed for nausea or vomiting. 30 tablet 1  . pantoprazole (PROTONIX) 40 MG tablet TAKE 1 TABLET BY MOUTH TWICE DAILY 60 tablet 3  . Probiotic Product (PROBIOTIC DAILY PO) Take by mouth.    . ranitidine (ZANTAC) 300 MG tablet Take 0.5 tablets (150 mg total) by mouth 2 (two) times daily. 60 tablet 3  . Semaglutide (OZEMPIC) 0.25 or 0.5 MG/DOSE SOPN Inject 0.5 mg into the skin once a week. 1 pen 3  . Simethicone (GAS-X PO) Take 1 tablet by mouth 4 (four) times daily.    Marland Kitchen SYNTHROID 137 MCG tablet TAKE 2 TABLETS BY MOUTH DAILY BEFORE BREAKFAST. 60 tablet 3  . ticagrelor (BRILINTA) 60 MG TABS tablet Take 1 tablet (60 mg total) by mouth 2 (two) times daily. 180 tablet 3  . traZODone (DESYREL) 50 MG tablet Take 25 mg by mouth at bedtime.     . valACYclovir (VALTREX) 500 MG tablet Take 1 tablet (500 mg total) by mouth 2 (two) times daily. 60 tablet 11  . venlafaxine XR (EFFEXOR XR) 37.5 MG 24 hr capsule Take 37.5 mg by mouth. Takes 3 capsules daily    . VICTOZA 18 MG/3ML SOPN INJECT 1.8 MG UNDER THE SKIN DAILY AT THE  SAME TIME EVERY DAY 9 mL 2  . vitamin C (ASCORBIC ACID) 500 MG tablet Take 250 mg by mouth daily. Takes 2 a day     Current Facility-Administered Medications  Medication Dose Route Frequency Provider Last Rate Last Dose  . 0.9 %  sodium chloride infusion  500 mL Intravenous Continuous Irene Shipper, MD        Review of Systems Review of Systems  Constitutional: Negative.   Respiratory: Negative.   Cardiovascular: Negative.     Blood pressure (!) 142/80, pulse 84, resp. rate 14, height _0  (1.676 m), weight 225 lb (102.1 kg), last menstrual period 08/23/2006.  Physical Exam Physical Exam  Constitutional: She is oriented to person, place, and time. She appears well-developed and well-nourished.  Eyes: Conjunctivae are normal. No scleral icterus.  Neck: Neck supple.   Cardiovascular: Normal rate, regular rhythm and normal heart sounds.   Pulmonary/Chest: Effort normal and breath sounds normal.  Status post bilateral mastectomy. Well healed scars.  Abdominal: Soft.  Lymphadenopathy:    She has no cervical adenopathy.    She has no axillary adenopathy.  Neurological: She is alert and oriented to person, place, and time.  Skin: Skin is warm and dry.  Psychiatric: She has a normal mood and affect. Her behavior is normal.    Data Reviewed Prior notes reviewed.  Assessment    History of T1b,N0 left breast CA, DCIS of Left breast and extensive ADH and ALH bilateral breasts ER/PR positive, HER 2 negative- status post bilateral mastectomy. Patient on letrozole, no complaints other than occasional hot flashes, continue. Benign polyps found on most recent colonoscopy in April 2018. Observe and monitor for now. Stable exam otherwise    Plan    Follow up with Dr. Rogue Bussing in one year. Advised to call office with any questions or concerns.     HPI, Physical Exam, Assessment and Plan have been scribed under the direction and in the presence of Mckinley Jewel, MD  Gaspar Cola, CMA  I have completed the exam and reviewed the above documentation for accuracy and completeness.  I agree with the above.  Haematologist has been used and any errors in dictation or transcription are unintentional.  Zia Kanner G. Jamal Collin, M.D., F.A.C.S.   Junie Panning G 06/20/2016, 9:19 AM

## 2016-06-25 ENCOUNTER — Ambulatory Visit (INDEPENDENT_AMBULATORY_CARE_PROVIDER_SITE_OTHER): Payer: BLUE CROSS/BLUE SHIELD | Admitting: Internal Medicine

## 2016-06-25 ENCOUNTER — Encounter: Payer: Self-pay | Admitting: Internal Medicine

## 2016-06-25 VITALS — BP 140/80 | HR 80 | Ht 65.75 in | Wt 230.5 lb

## 2016-06-25 DIAGNOSIS — R197 Diarrhea, unspecified: Secondary | ICD-10-CM

## 2016-06-25 DIAGNOSIS — K5190 Ulcerative colitis, unspecified, without complications: Secondary | ICD-10-CM

## 2016-06-25 MED ORDER — MESALAMINE 1.2 G PO TBEC: 2.4000 g | DELAYED_RELEASE_TABLET | Freq: Every day | ORAL | 11 refills | Status: DC

## 2016-06-25 NOTE — Progress Notes (Signed)
HISTORY OF PRESENT ILLNESS:  Lindsay Fry is a 56 y.o. female with MULTIPLE SIGNIFICANT medical problems including morbid obesity, bipolar disorder, history of alcoholism, coronary artery disease on Brilinta, diabetes mellitus on multiple medications, long-standing ulcerative colitis previously managed by Dr. Olevia Perches. She has a family history of colon cancer. Prior history of chronic constipation for which she has used laxatives. History of GERD. I assumed her care after Dr. Nichola Sizer retirement. Colonoscopy February 2017 with multiple adenomatous polyps. Last colonoscopy April 2018 with diminutive adenomas. The remainder of colon was unremarkable except for slight hyperemia. Multiple random colon biopsies did not demonstrate active colitis or dysplasia. Follow-up in 1 year recommended. She presents today with her husband. Her chief complaint is loose stools. She has been having problems with her bipolar disorder for which she is seeing her psychiatrist and has had multiple medications adjusted. She is also on metformin. She tells me that Imodium every other day helps her diarrhea. She may take anywhere between one and 3. No nocturnal symptoms. No bleeding. No abdominal pain. Her only other complaint is indigestion which she thinks is related to her medications. Currently takes pantoprazole and ranitidine. Has had some weight gain since her last visit.  REVIEW OF SYSTEMS:  All non-GI ROS negative except for anxiety, depression, fatigue, shortness of breath  Past Medical History:  Diagnosis Date  . ADD (attention deficit disorder) 11/19/2012  . Allergic rhinitis, cause unspecified   . Anxiety state, unspecified   . Arthritis   . Asymptomatic postmenopausal status (age-related) (natural)   . Atypical hyperplasia of left breast 2000  . Benign neoplasm of other and unspecified site of the digestive system   . Bipolar disorder ([place])   . Breast cancer ([place])    Left Breast 32mm invasive CA left uiq,  dcis left uoq and a lot ADH, ALH in both breasts.  Marland Kitchen CAD (coronary artery disease)    a. 08/2014 NSTEMI/Cath: LM nl, LAD 50p, D1/D2 min irregs, LCX nl, OM1 40, LPDA nl, RI 99ost (2.8mm vessel->med Rx), RCA nl, AM 60, nl EF.  . Carcinoma of left breast ([place]) 06/02/2014  . Chronic diastolic CHF (congestive heart failure) ([place])    a. 08/2014 Echo: EF 60-65%, Gr 1 DD, nl LA.  Marland Kitchen Cyst    on Achilles tendon  . Depression   . Diabetes mellitus ([place])    frank  . Dysuria   . Edema   . Family history of malignant neoplasm of gastrointestinal tract   . Fibromyalgia   . GERD (gastroesophageal reflux disease)   . Glaucoma    pre glaucoma  . Headache    migraines  . Hiatal hernia   . History of alcoholism ([place])   . History of migraines   . History of ovarian cyst   . Insomnia, unspecified   . Myocardial infarction ([place]) 09-04-14  . OCD (obsessive compulsive disorder)   . Other screening mammogram   . Pure hypercholesterolemia   . Rosacea   . Subacute confusional state 11/19/2012  . Tubular adenoma of colon 01/10/11  . Ulcerative colitis, unspecified   . Unspecified asthma(493.90)   . Unspecified essential hypertension   . Unspecified hypothyroidism   . Unspecified vitamin D deficiency   . Vertigo     Past Surgical History:  Procedure Laterality Date  . APPENDECTOMY    . AXILLARY SENTINEL NODE BIOPSY Left 05/23/2014   Procedure: AXILLARY SENTINEL NODE BIOPSY;  Surgeon: Christene Lye, MD;  Location: [facility] ORS;  Service: General;  Laterality: Left;  . BREAST BIOPSY  Aug 2000   on tamoxifen, atypical hyperplasia  . BREAST SURGERY Left Aug 2000   lumpectomy/ Dr Sharlet Salina  . BREAST SURGERY Bilateral 05/23/14   Mastectomy  . BUNIONECTOMY Right   . CARDIAC CATHETERIZATION N/A 09/05/2014   Procedure: Left Heart Cath and Coronary Angiography;  Surgeon: Wellington Hampshire, MD;  Location: [place] [facility];  Service: Cardiovascular;  Laterality: N/A;  . CARDIAC CATHETERIZATION  09-05-14  .  CESAREAN SECTION  1996/1997   placenta previa, gest DM, pre-eclampsia  . cholecystect  1999  . CHOLECYSTECTOMY  1997   adhesions also  . COLONOSCOPY  01/2001   Ulcerative colitis  . COLONOSCOPY  04/2004   UC, polyp  . COLONOSCOPY  12/08   UC, no polyps  . DEXA  04/1999 and 2010   normal  . ESOPHAGOGASTRODUODENOSCOPY  09/2001   polyp  . exercise stress test  11/2003   negative  . FEMUR FRACTURE SURGERY Left   . MASTECTOMY Bilateral 05-23-14   Dr. Jamal Collin  . MECKEL DIVERTICULUM EXCISION  1999  . NASAL SINUS SURGERY  05/1997  . nuclear stress test  06/2007   negative  . precancerous mole removed    . SIMPLE MASTECTOMY WITH AXILLARY SENTINEL NODE BIOPSY Bilateral 05/23/2014   Procedure: Bilateral simple mastectomy, left sentinel node biopsy ;  Surgeon: Christene Lye, MD;  Location: [facility]S;  Service: General;  Laterality: Bilateral;  . Sleep study  11/09   no apnea, but did snore (done by [facility])  . TONSILLECTOMY  1984  . TUBAL LIGATION    . WISDOM TOOTH EXTRACTION  1980's    Social History [place]  reports that she quit smoking about 23 years ago. Her smoking use included Cigarettes. She has a 1.25 pack-year smoking history. She has quit using smokeless tobacco. She reports that she does not drink alcohol or use drugs.  family history includes Alzheimer's disease in her father; Colitis in her mother; Colon cancer in her father; Coronary artery disease in her father and mother; Crohn's disease in her mother; Dementia in her father; Hypertension in her mother; Osteoporosis in her mother; Stroke in her cousin.  Allergies  Allergen Reactions  . Ephedrine Other (See Comments)    Pt becomes hyper  . Aspartame And Phenylalanine Nausea Only  . Aspirin     REACTION: aggrivates colitis  . Crestor [Rosuvastatin]     REACTION: increased lfts  . Erythromycin     REACTION: GI upset  . Nsaids     REACTION: aggrivate colitis  . Oxycodone Other (See Comments)    Panic  Attack       PHYSICAL EXAMINATION: Vital signs: BP 140/80 (BP Location: Right Arm, Patient Position: Sitting, Cuff Size: Normal)   Pulse 80   Ht 5' 5.75" (1.67 m)   Wt 230 lb 8 oz (104.6 kg)   LMP 08/23/2006 (Approximate)   BMI 37.49 kg/m   Constitutional: Obese, unhealthyl-appearing, no acute distress Psychiatric: alert and oriented x3, cooperative Eyes: extraocular movements intact, anicteric, conjunctiva pink Mouth: oral pharynx moist, no lesions Neck: supple without thyromegaly Lymph no lymphadenopathy Cardiovascular: heart regular rate and rhythm, no murmur Lungs: clear to auscultation bilaterally Abdomen: soft, obese, nontender, nondistended, no obvious ascites, no peritoneal signs, normal bowel sounds, no organomegaly Rectal:Omitted Extremities: no clubbing or cyanosis. Trace lower extremity edema bilaterally Skin: no lesions on visible extremities Neuro: No focal deficits. Cranial nerves intact. No asterixis.    ASSESSMENT:   #  1. Long-standing ulcerative colitis. On Lialda. Recent colonoscopy as described #2. Loose stools. Multiple possibilities. Suspect medication related #3. Personal history of adenomatous polyps and Family history of colon cancer. Recent colonoscopy as described #4. Fatty liver #5. Medical problems   PLAN:  #1. Increase Lialda 2.4 g daily #2. Talked PCP about holding metformin (at this helps with diarrhea #3. Okay to use Imodium sparingly as you are as this is helpful and tolerated #4. Exercise and weight loss #5. Routine GI follow-up one year. We'll plan routine surveillance colonoscopy at that time. Contact the office in the interim for questions or problems  25 spent face-to-face with the patient. Greater than 50% a time use for counseling regarding her chronic inflammatory bowel disease, history of colorectal neoplasia. And assessment, management, and recommendations regarding her diarrhea. Multiple questions from patient and husband and  answered to their satisfaction

## 2016-06-25 NOTE — Patient Instructions (Signed)
We have sent the following medications to your pharmacy for you to pick up at your convenience:  Lialda - increase it to two tablets a day  You may use Imodium as needed  Please follow up in one year

## 2016-07-02 ENCOUNTER — Ambulatory Visit: Payer: BLUE CROSS/BLUE SHIELD | Admitting: Psychology

## 2016-07-03 ENCOUNTER — Other Ambulatory Visit: Payer: Self-pay | Admitting: Endocrinology

## 2016-07-30 ENCOUNTER — Encounter: Payer: Self-pay | Admitting: Family Medicine

## 2016-07-30 ENCOUNTER — Other Ambulatory Visit: Payer: Self-pay

## 2016-08-01 DIAGNOSIS — F3131 Bipolar disorder, current episode depressed, mild: Secondary | ICD-10-CM | POA: Diagnosis not present

## 2016-08-02 ENCOUNTER — Encounter: Payer: Self-pay | Admitting: Internal Medicine

## 2016-08-05 ENCOUNTER — Ambulatory Visit
Admission: RE | Admit: 2016-08-05 | Discharge: 2016-08-05 | Disposition: A | Discharge status: Home / Self Care | Payer: Disability Insurance | Source: Ambulatory Visit | Attending: Internal Medicine | Admitting: Internal Medicine

## 2016-08-05 ENCOUNTER — Other Ambulatory Visit: Payer: Self-pay | Admitting: Internal Medicine

## 2016-08-05 DIAGNOSIS — M1990 Unspecified osteoarthritis, unspecified site: Secondary | ICD-10-CM | POA: Insufficient documentation

## 2016-08-05 DIAGNOSIS — G43909 Migraine, unspecified, not intractable, without status migrainosus: Secondary | ICD-10-CM | POA: Insufficient documentation

## 2016-08-05 DIAGNOSIS — Z853 Personal history of malignant neoplasm of breast: Secondary | ICD-10-CM | POA: Insufficient documentation

## 2016-08-05 DIAGNOSIS — Z9049 Acquired absence of other specified parts of digestive tract: Secondary | ICD-10-CM | POA: Insufficient documentation

## 2016-08-05 DIAGNOSIS — F319 Bipolar disorder, unspecified: Secondary | ICD-10-CM | POA: Insufficient documentation

## 2016-08-05 DIAGNOSIS — H40059 Ocular hypertension, unspecified eye: Secondary | ICD-10-CM | POA: Diagnosis not present

## 2016-08-05 DIAGNOSIS — M419 Scoliosis, unspecified: Secondary | ICD-10-CM | POA: Insufficient documentation

## 2016-08-05 DIAGNOSIS — M797 Fibromyalgia: Secondary | ICD-10-CM | POA: Insufficient documentation

## 2016-08-05 DIAGNOSIS — M4120 Other idiopathic scoliosis, site unspecified: Secondary | ICD-10-CM

## 2016-08-05 DIAGNOSIS — E079 Disorder of thyroid, unspecified: Secondary | ICD-10-CM | POA: Diagnosis not present

## 2016-08-06 ENCOUNTER — Ambulatory Visit (INDEPENDENT_AMBULATORY_CARE_PROVIDER_SITE_OTHER): Payer: BLUE CROSS/BLUE SHIELD | Admitting: Psychology

## 2016-08-06 DIAGNOSIS — F3131 Bipolar disorder, current episode depressed, mild: Secondary | ICD-10-CM

## 2016-08-08 ENCOUNTER — Encounter: Payer: Self-pay | Admitting: Physician Assistant

## 2016-08-08 ENCOUNTER — Ambulatory Visit (INDEPENDENT_AMBULATORY_CARE_PROVIDER_SITE_OTHER): Payer: BLUE CROSS/BLUE SHIELD | Admitting: Physician Assistant

## 2016-08-08 ENCOUNTER — Other Ambulatory Visit (INDEPENDENT_AMBULATORY_CARE_PROVIDER_SITE_OTHER): Payer: BLUE CROSS/BLUE SHIELD

## 2016-08-08 VITALS — BP 188/78 | HR 74 | Ht 66.0 in | Wt 229.0 lb

## 2016-08-08 DIAGNOSIS — R110 Nausea: Secondary | ICD-10-CM | POA: Diagnosis not present

## 2016-08-08 DIAGNOSIS — K219 Gastro-esophageal reflux disease without esophagitis: Secondary | ICD-10-CM | POA: Diagnosis not present

## 2016-08-08 LAB — CBC WITH DIFFERENTIAL/PLATELET
Basophils Absolute: 0.1 10*3/uL (ref 0.0–0.1)
Basophils Relative: 0.7 % (ref 0.0–3.0)
Eosinophils Absolute: 0.4 10*3/uL (ref 0.0–0.7)
Eosinophils Relative: 2.7 % (ref 0.0–5.0)
HCT: 45.6 % (ref 36.0–46.0)
Hemoglobin: 14.8 g/dL (ref 12.0–15.0)
Lymphocytes Relative: 14.5 % (ref 12.0–46.0)
Lymphs Abs: 1.9 10*3/uL (ref 0.7–4.0)
MCHC: 32.5 g/dL (ref 30.0–36.0)
MCV: 91 fl (ref 78.0–100.0)
Monocytes Absolute: 1 10*3/uL (ref 0.1–1.0)
Monocytes Relative: 7.4 % (ref 3.0–12.0)
Neutro Abs: 9.9 10*3/uL — ABNORMAL HIGH (ref 1.4–7.7)
Neutrophils Relative %: 74.7 % (ref 43.0–77.0)
Platelets: 427 10*3/uL — ABNORMAL HIGH (ref 150.0–400.0)
RBC: 5.01 Mil/uL (ref 3.87–5.11)
RDW: 15 % (ref 11.5–15.5)
WBC: 13.3 10*3/uL — ABNORMAL HIGH (ref 4.0–10.5)

## 2016-08-08 LAB — COMPREHENSIVE METABOLIC PANEL
ALT: 43 U/L — ABNORMAL HIGH (ref 0–35)
AST: 31 U/L (ref 0–37)
Albumin: 4.4 g/dL (ref 3.5–5.2)
Alkaline Phosphatase: 91 U/L (ref 39–117)
BUN: 5 mg/dL — ABNORMAL LOW (ref 6–23)
CO2: 29 mEq/L (ref 19–32)
Calcium: 10.2 mg/dL (ref 8.4–10.5)
Chloride: 102 mEq/L (ref 96–112)
Creatinine, Ser: 0.91 mg/dL (ref 0.40–1.20)
GFR: 67.47 mL/min (ref >60.00)
Glucose, Bld: 136 mg/dL — ABNORMAL HIGH (ref 70–99)
Potassium: 3.9 mEq/L (ref 3.5–5.1)
Sodium: 140 mEq/L (ref 135–145)
Total Bilirubin: 0.5 mg/dL (ref 0.2–1.2)
Total Protein: 7.4 g/dL (ref 6.0–8.3)

## 2016-08-08 LAB — LIPASE: Lipase: 29 U/L (ref 11.0–59.0)

## 2016-08-08 MED ORDER — ONDANSETRON 4 MG PO TBDP: ORAL_TABLET | ORAL | 2 refills | Status: DC

## 2016-08-08 NOTE — Patient Instructions (Addendum)
Please go to the basement level to have your labs drawn.  We have sent the following medications to your pharmacy for you to pick up at your [facility] in [place], [place].   We have provided you with a Gastroparesis Diet handout.  Keep you your follow up appointment with Dr. Henrene Pastor 09-18-2016 at 1:30 PM.

## 2016-08-08 NOTE — Progress Notes (Signed)
Subjective:    Patient ID: Lindsay Fry, female    DOB: 02-19-1960, 56 y.o.   MRN: [id number]  HPI Lindsay Fry is a pleasant 56 year old white female, known to Dr. Henrene Pastor with multiple medical problems. She was last seen in our office in June 2018 at that time with complaints of diarrhea. Patient has history of hypertension, coronary artery disease, she is status post non-ST MI and is maintained on Brilinta. She has history of obesity, breast cancer, bipolar disorder, recovering alcoholism, ADD, adult onset diabetes mellitus and GERD. She also has history of ulcerative colitis however last colonoscopy in April 2018 with no evidence of active colitis on multiple random biopsies. She did have some diminutive adenomas removed and is indicated for one year follow-up given long history of UC in the past. When seen for diarrhea in June it was felt this was more likely medication related. She was having adjustments on her psych meds and was also advised that she hold metformin as this may have been contributing. She is continued on Lialda 2.4 g daily. Today she says that she never did stop the metformin but has been using Imodium on an as-needed basis which is helpful. She has new complaint today with nausea and intermittent vomiting. She says her current symptoms started within the past couple of months after she was started on lithium. She says she been having a lot of problems with depression. She started taking the lithium on an empty stomach and dose was quickly nausea after all of her meals and vomiting if she would eat any significant amount. She says she can manage if she eats small amounts 3-4 times daily and has had also been having to spread out her meals in order to take her educations adequately. She does have a prescription for Zofran which she's been using as needed. She has had some mild epigastric tenderness but no significant abdominal pain. She continues on twice a day PPI and twice a day H2  blocker for her reflux symptoms. She is status post cholecystectomy. In addition to having her lithium dose doubled she has also just started on Latuda.  Review of Systems Pertinent positive and negative review of systems were noted in the above HPI section.  All other review of systems was otherwise negative.  Outpatient Encounter Prescriptions as of 08/08/2016  Medication Sig  . Asenapine Maleate (SAPHRIS) 10 MG SUBL Place 10 mg under the tongue at bedtime.   Marland Kitchen aspirin 81 MG tablet Take 81 mg by mouth daily.  Marland Kitchen atorvastatin (LIPITOR) 80 MG tablet TAKE 1 TABLET(80 MG) BY MOUTH DAILY  . b complex vitamins capsule Take 1 capsule by mouth daily.  Marland Kitchen BAYER MICROLET LANCETS lancets USE AS DIRECTED TO CHECK BLOOD SUGAR TWICE DAILY  . bismuth subsalicylate (PEPTO BISMOL) 262 MG chewable tablet Chew 524 mg by mouth as needed.  . Blood Glucose Monitoring Suppl (CONTOUR NEXT EZ MONITOR) w/Device KIT USE AS DIRECTED TO CHECK BLOOD SUGAR TWICE DAILY  . BYSTOLIC 10 MG tablet TAKE 1 TABLET BY MOUTH IN THE MORNING AND 2 TABLETS AFTER LUNCH  . CARTIA XT 240 MG 24 hr capsule TAKE 1 CAPSULE(240 MG) BY MOUTH DAILY  . chlorproMAZINE (THORAZINE) 25 MG tablet Take 25-50 mg by mouth as needed. Reported on 03/02/2015  . Cholecalciferol (VITAMIN D3) 2000 UNITS TABS Take 1 tablet by mouth daily.  . cloNIDine (CATAPRES) 0.1 MG tablet Take 1 tablet (0.1 mg total) by mouth 2 (two) times daily.  Marland Kitchen co-enzyme Q-10  50 MG capsule Take 50 mg by mouth daily.  . diazepam (VALIUM) 10 MG tablet Take 2.5-10 mg by mouth as needed for anxiety.  . fluticasone (FLONASE) 50 MCG/ACT nasal spray USE 2 SPRAYS IN EACH NOSTRIL TWICE DAILY  . gabapentin (NEURONTIN) 300 MG capsule TAKE 1 CAPSULE(300 MG) BY MOUTH THREE TIMES DAILY  . glucose blood (BAYER CONTOUR NEXT TEST) test strip USE AS DIRECTED TO CHECK BLOOD SUGAR TWICE DAILY  . Insulin Detemir (LEVEMIR) 100 UNIT/ML Pen Inject 32 Units into the skin daily at 10 pm. (Patient taking  differently: Inject 35 Units into the skin daily at 10 pm. Uses 35 to 36 units)  . Insulin Pen Needle (B-D UF III MINI PEN NEEDLES) 31G X 5 MM MISC Use to inject 2 times per day.  . INVOKANA 300 MG TABS tablet TAKE 1 TABLET(300 MG) BY MOUTH DAILY BEFORE BREAKFAST  . lamoTRIgine (LAMICTAL) 200 MG tablet Take 400 mg by mouth daily.  Marland Kitchen latanoprost (XALATAN) 0.005 % ophthalmic solution Place 1 drop into both eyes At bedtime.  Marland Kitchen letrozole (FEMARA) 2.5 MG tablet Take 1 tablet (2.5 mg total) by mouth daily.  . Liniments (SALONPAS PAIN RELIEF PATCH EX) Apply topically as needed. Reported on 03/02/2015  . lithium carbonate (ESKALITH) 450 MG CR tablet Take 900 mg by mouth at bedtime.  Marland Kitchen loperamide (IMODIUM) 2 MG capsule Take 4 mg by mouth as needed for diarrhea or loose stools.  Marland Kitchen lurasidone (LATUDA) 40 MG TABS tablet Take 40 mg by mouth daily with breakfast.  . Melatonin 10 MG CAPS Take 1 capsule by mouth at bedtime.  . mesalamine (LIALDA) 1.2 g EC tablet Take 2 tablets (2.4 g total) by mouth daily.  . metFORMIN (GLUCOPHAGE-XR) 500 MG 24 hr tablet TAKE 4 TABLETS BY MOUTH DAILY  . Multiple Vitamin (MULTIVITAMIN) tablet Take 1 tablet by mouth daily.    . nitrofurantoin (MACRODANTIN) 100 MG capsule Take 100 mg by mouth as directed. Reported on 03/02/2015  . Omega-3 Krill Oil 500 MG CAPS Take 500 mg by mouth.  . ondansetron (ZOFRAN) 4 MG tablet Take 1 tablet (4 mg total) by mouth every 8 (eight) hours as needed for nausea or vomiting.  . pantoprazole (PROTONIX) 40 MG tablet TAKE 1 TABLET BY MOUTH TWICE DAILY  . Probiotic Product (PROBIOTIC DAILY PO) Take by mouth.  . ranitidine (ZANTAC) 300 MG tablet Take 0.5 tablets (150 mg total) by mouth 2 (two) times daily.  . Semaglutide (OZEMPIC) 0.25 or 0.5 MG/DOSE SOPN Inject 0.5 mg into the skin once a week.  . Simethicone (GAS-X PO) Take 1 tablet by mouth 4 (four) times daily.  Marland Kitchen SYNTHROID 137 MCG tablet TAKE 2 TABLETS BY MOUTH DAILY BEFORE BREAKFAST.  Marland Kitchen ticagrelor  (BRILINTA) 60 MG TABS tablet Take 1 tablet (60 mg total) by mouth 2 (two) times daily.  . traZODone (DESYREL) 50 MG tablet Take 25 mg by mouth at bedtime.   . valACYclovir (VALTREX) 500 MG tablet Take 1 tablet (500 mg total) by mouth 2 (two) times daily.  Marland Kitchen VICTOZA 18 MG/3ML SOPN INJECT 1.8 MG UNDER THE SKIN DAILY AT THE SAME TIME EVERY DAY  . vitamin C (ASCORBIC ACID) 500 MG tablet Take 250 mg by mouth daily. Takes 2 a day  . ondansetron (ZOFRAN ODT) 4 MG disintegrating tablet Take 1 each morning then every 6 hours for nausea.   Facility-Administered Encounter Medications as of 08/08/2016  Medication  . 0.9 %  sodium chloride infusion   Allergies  Allergen Reactions  . Ephedrine Other (See Comments)    Pt becomes hyper  . Aspartame And Phenylalanine Nausea Only  . Aspirin     REACTION: aggrivates colitis  . Crestor [Rosuvastatin]     REACTION: increased lfts  . Erythromycin     REACTION: GI upset  . Nsaids     REACTION: aggrivate colitis  . Oxycodone Other (See Comments)    Panic Attack   Patient Active Problem List   Diagnosis Date Noted  . Estrogen deficiency 05/30/2016  . Need for hepatitis C screening test 03/19/2016  . Screening for HIV (human immunodeficiency virus) 03/19/2016  . Dysuria 03/19/2016  . Achilles bursitis 03/14/2016  . Carcinoma of overlapping sites of left breast in female, estrogen receptor positive ([place]) 08/21/2015  . Cough 03/20/2015  . Chronic constipation 01/18/2015  . NSTEMI (non-ST elevated myocardial infarction) ([place]) 09/06/2014  . CAD (coronary artery disease) 09/06/2014  . DM (diabetes mellitus) ([place]) 09/05/2014  . 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 Carcinoma of left breast ([place]) 06/02/2014  . Sinus tachycardia 04/27/2014  . Obesity 05/20/2013  . Bladder pain 02/17/2013  . ADD (attention deficit disorder) 11/19/2012  . Urinary frequency 08/05/2012  . Low back pain 08/05/2012  . 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 Chronic sinusitis 01/24/2012  . Vertigo, benign positional 01/24/2012  . Other screening mammogram 12/05/2010  . Routine general medical examination at a health care facility 10/14/2010  . FATIGUE 08/09/2009  . POSTMENOPAUSAL STATUS 06/22/2008  . UNSPECIFIED VITAMIN D DEFICIENCY 03/18/2008  . BENIGN NEOPLASM Bernard SITE DIGESTIVE SYSTEM 01/09/2007  . HIATAL HERNIA 01/09/2007  . COLONIC POLYPS, ADENOMATOUS, HX OF 01/09/2007  . HYPERCHOLESTEROLEMIA 01/07/2007  . Bipolar disorder, unspecified ([place]) 01/07/2007  . Bipolar I disorder, most recent episode depressed ([place]) 01/07/2007  . Essential hypertension 01/07/2007  . ALLERGIC RHINITIS 01/07/2007  . ASTHMA 01/07/2007  . GERD 01/07/2007  . Ulcerative colitis ([place]) 01/07/2007  . ACNE ROSACEA 01/07/2007  . MIGRAINES, HX OF 01/07/2007  . Fibromyalgia 10/30/2006  . INSOMNIA 10/30/2006   Social History   Social History  . Marital status: Married    Spouse name: alan  . Number of children: 2  . Years of education: college   Occupational History  . unemployed    Social History Main Topics  . Smoking status: Former Smoker    Packs/day: 0.25    Years: 5.00    Types: Cigarettes    Quit date: 01/14/1993  . Smokeless tobacco: Former Systems developer  . Alcohol use No     Comment: Recovered ETOH  . Drug use: No  . Sexual activity: Not Currently    Partners: Male   Other Topics Concern  . 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 Schecter's family history includes Alzheimer's disease in her father; Breast cancer in her unknown relative; Colitis in her mother; Colon cancer in her father; Coronary artery disease in her father, mother, and unknown relative; Crohn's disease in her mother; Dementia in her father; Diabetes in her unknown relative; Hypertension in her mother; Osteoporosis in her mother;  Stroke in her cousin.      Objective:    Vitals:   08/08/16 1331  BP: (!) 188/78  Pulse: 74  Physical Exam well-developed white female in no acute distress, accompanied by her husband, both pleasant blood pressure 188/78 pulse 74, height 5 foot 6, weight 229, BMI 36.9. HEENT ;nontraumatic, cephalic EOMI PERRLA sclera anicteric, Cardiovascular; regular rate and rhythm with S1-S2 no murmur or gallop, Pulmonary ;clear bilaterally, Abdomen; obese, soft, she is mild epigastric tenderness there is no guarding or rebound no palpable mass or hepatosplenomegaly bowel sounds are present, Rectal ;exam not done, Extremities; no clubbing cyanosis or edema skin warm and dry, Neuropsych; mood and affect appropriate       Assessment & Plan:   #8 56 year old white female with new complaints of several week history of nausea, intermittent vomiting postprandially and vomiting after any larger meals This is in the setting of recent initiation of lithium and doubling dose of lithium. I think her symptoms are medication side effects. She may have a component of education induced gastroparesis #2 chronic GERD stable #3 status post cholecystectomy #4 history of ulcerative colitis-in remission with no evidence on recent biopsies of active colitis #5 Diarrhea-chronic felt also medication induced and in part secondary to metformin- #6 bipolar disorder-poorly controlled #7 Adult-onset diabetes mellitus  #8 morbid obesity #9 history of breast cancer #10 history of alcoholism/in recovery #11 coronary artery disease status post non-ST MI on chronic anticoagulation  Plan; start step 3 gastroparesis diet and advised continuing small more frequent meals Continue current dose of Zantac 150 twice a day Continue current dose Protonix 40 mg twice a day Start Zofran ODT 4 mg, she is to take 1 pill on arising in the morning and then every 6 hours around the clock. Her symptoms are very likely secondary to initiation  of lithium. This seems to be helping her depression , so would like to continue. She strongly encouraged to review her GI symptoms with her psychiatrist Dr. Wylene Simmer at follow-up follow-up in a couple of weeks. She may not be able to tolerate current dose of lithium long-term. She will follow up with Dr. Henrene Pastor or myself on an as-needed basis. She'll need follow-up colonoscopy spring 2019.   S  PA-C 08/08/2016   Cc: Tower, Wynelle Fanny, MD

## 2016-08-09 ENCOUNTER — Encounter: Payer: Self-pay | Admitting: Internal Medicine

## 2016-08-09 ENCOUNTER — Other Ambulatory Visit: Payer: Self-pay | Admitting: Endocrinology

## 2016-08-09 NOTE — Progress Notes (Signed)
Assessment and plans reviewed  

## 2016-08-11 ENCOUNTER — Other Ambulatory Visit: Payer: Self-pay | Admitting: Endocrinology

## 2016-08-12 ENCOUNTER — Encounter: Payer: Self-pay | Admitting: Endocrinology

## 2016-08-12 ENCOUNTER — Encounter: Payer: Self-pay | Admitting: Internal Medicine

## 2016-08-13 ENCOUNTER — Other Ambulatory Visit: Payer: Self-pay

## 2016-08-13 MED ORDER — DIPHENOXYLATE-ATROPINE 2.5-0.025 MG PO TABS: ORAL_TABLET | ORAL | 0 refills | Status: DC

## 2016-08-21 ENCOUNTER — Ambulatory Visit: Payer: BLUE CROSS/BLUE SHIELD | Admitting: Internal Medicine

## 2016-08-21 ENCOUNTER — Other Ambulatory Visit: Payer: BLUE CROSS/BLUE SHIELD

## 2016-08-22 ENCOUNTER — Ambulatory Visit: Payer: Self-pay | Admitting: Endocrinology

## 2016-08-25 ENCOUNTER — Other Ambulatory Visit: Payer: Self-pay | Admitting: Internal Medicine

## 2016-08-26 ENCOUNTER — Other Ambulatory Visit: Payer: Self-pay | Admitting: Endocrinology

## 2016-08-28 ENCOUNTER — Inpatient Hospital Stay: Payer: BLUE CROSS/BLUE SHIELD | Attending: Internal Medicine | Admitting: Internal Medicine

## 2016-08-28 ENCOUNTER — Inpatient Hospital Stay: Payer: BLUE CROSS/BLUE SHIELD

## 2016-08-28 VITALS — BP 132/87 | HR 80 | Temp 97.4°F | Resp 16 | Wt 224.1 lb

## 2016-08-28 DIAGNOSIS — F319 Bipolar disorder, unspecified: Secondary | ICD-10-CM | POA: Insufficient documentation

## 2016-08-28 DIAGNOSIS — L719 Rosacea, unspecified: Secondary | ICD-10-CM | POA: Diagnosis not present

## 2016-08-28 DIAGNOSIS — Z79811 Long term (current) use of aromatase inhibitors: Secondary | ICD-10-CM | POA: Diagnosis not present

## 2016-08-28 DIAGNOSIS — K449 Diaphragmatic hernia without obstruction or gangrene: Secondary | ICD-10-CM | POA: Diagnosis not present

## 2016-08-28 DIAGNOSIS — E039 Hypothyroidism, unspecified: Secondary | ICD-10-CM | POA: Insufficient documentation

## 2016-08-28 DIAGNOSIS — I252 Old myocardial infarction: Secondary | ICD-10-CM | POA: Diagnosis not present

## 2016-08-28 DIAGNOSIS — K219 Gastro-esophageal reflux disease without esophagitis: Secondary | ICD-10-CM | POA: Insufficient documentation

## 2016-08-28 DIAGNOSIS — Z87891 Personal history of nicotine dependence: Secondary | ICD-10-CM | POA: Diagnosis not present

## 2016-08-28 DIAGNOSIS — F429 Obsessive-compulsive disorder, unspecified: Secondary | ICD-10-CM | POA: Insufficient documentation

## 2016-08-28 DIAGNOSIS — Z79899 Other long term (current) drug therapy: Secondary | ICD-10-CM | POA: Insufficient documentation

## 2016-08-28 DIAGNOSIS — E559 Vitamin D deficiency, unspecified: Secondary | ICD-10-CM | POA: Diagnosis not present

## 2016-08-28 DIAGNOSIS — C50812 Malignant neoplasm of overlapping sites of left female breast: Secondary | ICD-10-CM | POA: Insufficient documentation

## 2016-08-28 DIAGNOSIS — I2510 Atherosclerotic heart disease of native coronary artery without angina pectoris: Secondary | ICD-10-CM | POA: Diagnosis not present

## 2016-08-28 DIAGNOSIS — E7800 Pure hypercholesterolemia, unspecified: Secondary | ICD-10-CM | POA: Insufficient documentation

## 2016-08-28 DIAGNOSIS — I5032 Chronic diastolic (congestive) heart failure: Secondary | ICD-10-CM | POA: Insufficient documentation

## 2016-08-28 DIAGNOSIS — Z9013 Acquired absence of bilateral breasts and nipples: Secondary | ICD-10-CM | POA: Diagnosis not present

## 2016-08-28 DIAGNOSIS — J45909 Unspecified asthma, uncomplicated: Secondary | ICD-10-CM | POA: Insufficient documentation

## 2016-08-28 DIAGNOSIS — M797 Fibromyalgia: Secondary | ICD-10-CM | POA: Insufficient documentation

## 2016-08-28 DIAGNOSIS — H409 Unspecified glaucoma: Secondary | ICD-10-CM | POA: Insufficient documentation

## 2016-08-28 DIAGNOSIS — Z170 Estrogen receptor positive status [ER+]: Secondary | ICD-10-CM

## 2016-08-28 DIAGNOSIS — Z794 Long term (current) use of insulin: Secondary | ICD-10-CM | POA: Insufficient documentation

## 2016-08-28 DIAGNOSIS — I110 Hypertensive heart disease with heart failure: Secondary | ICD-10-CM | POA: Insufficient documentation

## 2016-08-28 DIAGNOSIS — K5190 Ulcerative colitis, unspecified, without complications: Secondary | ICD-10-CM | POA: Insufficient documentation

## 2016-08-28 DIAGNOSIS — M1990 Unspecified osteoarthritis, unspecified site: Secondary | ICD-10-CM | POA: Diagnosis not present

## 2016-08-28 LAB — COMPREHENSIVE METABOLIC PANEL
ALT: 47 U/L (ref 14–54)
AST: 51 U/L — ABNORMAL HIGH (ref 15–41)
Albumin: 4.4 g/dL (ref 3.5–5.0)
Alkaline Phosphatase: 89 U/L (ref 38–126)
Anion gap: 10 (ref 5–15)
BUN: 6 mg/dL (ref 6–20)
CO2: 27 mmol/L (ref 22–32)
Calcium: 10.1 mg/dL (ref 8.9–10.3)
Chloride: 101 mmol/L (ref 101–111)
Creatinine, Ser: 0.95 mg/dL (ref 0.44–1.00)
GFR calc Af Amer: >60 mL/min (ref >60)
GFR calc non Af Amer: >60 mL/min (ref >60)
Glucose, Bld: 154 mg/dL — ABNORMAL HIGH (ref 65–99)
Potassium: 4.3 mmol/L (ref 3.5–5.1)
Sodium: 138 mmol/L (ref 135–145)
Total Bilirubin: 0.6 mg/dL (ref 0.3–1.2)
Total Protein: 7.6 g/dL (ref 6.5–8.1)

## 2016-08-28 LAB — CBC WITH DIFFERENTIAL/PLATELET
Basophils Absolute: 0.1 10*3/uL (ref 0–0.1)
Basophils Relative: 1 %
Eosinophils Absolute: 0.3 10*3/uL (ref 0–0.7)
Eosinophils Relative: 3 %
HCT: 46.2 % (ref 35.0–47.0)
Hemoglobin: 15.4 g/dL (ref 12.0–16.0)
Lymphocytes Relative: 19 %
Lymphs Abs: 2 10*3/uL (ref 1.0–3.6)
MCH: 29.9 pg (ref 26.0–34.0)
MCHC: 33.3 g/dL (ref 32.0–36.0)
MCV: 89.7 fL (ref 80.0–100.0)
Monocytes Absolute: 0.8 10*3/uL (ref 0.2–0.9)
Monocytes Relative: 8 %
Neutro Abs: 7.4 10*3/uL — ABNORMAL HIGH (ref 1.4–6.5)
Neutrophils Relative %: 69 %
Platelets: 444 10*3/uL — ABNORMAL HIGH (ref 150–440)
RBC: 5.16 MIL/uL (ref 3.80–5.20)
RDW: 14.6 % — ABNORMAL HIGH (ref 11.5–14.5)
WBC: 10.6 10*3/uL (ref 3.6–11.0)

## 2016-08-28 NOTE — Progress Notes (Signed)
[facility] OFFICE PROGRESS NOTE  Patient Care Team: Tower, Wynelle Fanny, MD as PCP - General Elayne Snare, MD as Consulting Physician (Endocrinology) Chucky May, MD as Consulting Physician (Psychiatry) Laurey Morale, Wonda Cheng, MD (Obstetrics and Gynecology) Christene Lye, MD (General Surgery) Minna Merritts, MD as Consulting Physician (Cardiology)  Cancer Staging Carcinoma of left breast [facility]) Staging form: Breast, AJCC 7th Edition - Clinical: Stage IA (T1b, N0, M0) - Unsigned Staging comments: Kidney frequent changes in the 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  No invasive cancer in the right breast.  Status post bilateral mastectomy    Oncology History   1.  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  Patient underwent bilateral mastectomy  May 9 , 2016  Patient has invasive carcinoma and left breast with significant changes in the right breast ductal carcinoma in situ patient underwent bilateral mastectomy.  Left breast: T1 b N0 M0 stage IB estrogen and progesterone receptor HER-2/neu- Not overexpressed  2.    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  female pleasant patient above history of Left-sided breast cancer and also right-sided DCIS status post bilateral mastectomy currently on letrozole is here for follow-up.  The interim patient had colonoscopy by GI for  the history of ulcerative colitis.  Patient's hot flashes are stable. She has been taken off Effexor to be started on lithium. Patient denies any lumps or bumps. She has chronic arthralgias which are improved on Neurontin.    REVIEW OF SYSTEMS:  A complete 10 point review of system is done which is negative except mentioned above/history of present illness.   PAST MEDICAL HISTORY :  Past Medical History:  Diagnosis Date  . ADD (attention deficit disorder) 11/19/2012  . Allergic rhinitis, cause unspecified   . Anxiety state, unspecified   . Arthritis   . Asymptomatic postmenopausal status (age-related) (natural)   . Atypical hyperplasia of left breast 2000  . Benign neoplasm of other and unspecified site of the digestive system   . Bipolar disorder ([place])   . Breast cancer ([place])    Left Breast 57m invasive CA left uiq, dcis left uoq and a lot ADH, ALH in both breasts.  .Marland KitchenCAD (coronary artery disease)    a. 08/2014 NSTEMI/Cath: LM nl, LAD 50p, D1/D2 min irregs, LCX nl, OM1 40, LPDA nl, RI 99ost (2.057mvessel->med Rx), RCA nl, AM 60, nl EF.  . Carcin[place] of left breast ([place]  . Chronic diastolic CHF (congestive heart failure) ([place]   a. 08/2014 Echo: EF 60-65%, Gr 1 DD, nl LA.  . Marland Kitchenyst    on Achilles tendon  . Depression   . Diabetes mellitus ([place]   frank  . Dysuria   . Edema   . Family history of malignant neoplasm of gastrointestinal tract   . Fibromyalgia   . GERD (  gastroesophageal reflux disease)   . Glaucoma    pre glaucoma  . Headache    migraines  . Hiatal hernia   . History of alcoholism ([place])   . History of migraines   . History of ovarian cyst   . Insomnia, unspecified   . Myocardial infarction ([place]) 09-04-14  . OCD (obsessive compulsive disorder)   . Other screening mammogram   . Pure hypercholesterolemia   . Rosacea   . Subacute confusional state 11/19/2012  . Tubular adenoma of colon 01/10/11  . Ulcerative colitis, unspecified   . Unspecified  asthma(493.90)   . Unspecified essential hypertension   . Unspecified hypothyroidism   . Unspecified vitamin D deficiency   . Vertigo     PAST SURGICAL HISTORY :   Past Surgical History:  Procedure Laterality Date  . APPENDECTOMY    . AXILLARY SENTINEL NODE BIOPSY Left 05/23/2014   Procedure: AXILLARY SENTINEL NODE BIOPSY;  Surgeon: Christene Lye, MD;  Location: [facility] ORS;  Service: General;  Laterality: Left;  . BREAST BIOPSY  Aug 2000   on tamoxifen, atypical hyperplasia  . BREAST SURGERY Left Aug 2000   lumpectomy/ Dr Sharlet Salina  . BREAST SURGERY Bilateral 05/23/14   Mastectomy  . BUNIONECTOMY Right   . CARDIAC CATHETERIZATION N/A 09/05/2014   Procedure: Left Heart Cath and Coronary Angiography;  Surgeon: Wellington Hampshire, MD;  Location: [facility];  Service: Cardiovascular;  Laterality: N/A;  . CARDIAC CATHETERIZATION  09-05-14  . CESAREAN SECTION  1996/1997   placenta previa, gest DM, pre-eclampsia  . cholecystect  1999  . CHOLECYSTECTOMY  1997   adhesions also  . COLONOSCOPY  01/2001   Ulcerative colitis  . COLONOSCOPY  04/2004   UC, polyp  . COLONOSCOPY  12/08   UC, no polyps  . DEXA  04/1999 and 2010   normal  . ESOPHAGOGASTRODUODENOSCOPY  09/2001   polyp  . exercise stress test  11/2003   negative  . FEMUR FRACTURE SURGERY Left   . MASTECTOMY Bilateral 05-23-14   Dr. Jamal Collin  . MECKEL DIVERTICULUM EXCISION  1999  . NASAL SINUS SURGERY  05/1997  . nuclear stress test  06/2007   negative  . precancerous mole removed    . SIMPLE MASTECTOMY WITH AXILLARY SENTINEL NODE BIOPSY Bilateral 05/23/2014   Procedure: Bilateral simple mastectomy, left sentinel node biopsy ;  Surgeon: Christene Lye, MD;  Location: [facility] ORS;  Service: General;  Laterality: Bilateral;  . Sleep study  11/09   no apnea, but did snore (done by [facility])  . TONSILLECTOMY  1984  . TUBAL LIGATION    . WISDOM TOOTH EXTRACTION  1980's    FAMILY HISTORY :   Family History  Problem  Relation Age of Onset  . Coronary artery disease Father   . Colon cancer Father   . Alzheimer's disease Father   . Dementia Father   . Coronary artery disease Mother   . Hypertension Mother   . Osteoporosis Mother   . Colitis Mother   . Crohn's disease Mother   . Breast cancer Unknown        great aunts  . Coronary artery disease Unknown        Uncle (also AAA)  . Diabetes Unknown        remote family history  . Stroke Cousin   . Esophageal cancer Neg Hx   . Rectal cancer Neg Hx   . Stomach cancer Neg Hx     SOCIAL  HISTORY:   Social History  Substance Use Topics  . Smoking status: Former Smoker    Packs/day: 0.25    Years: 5.00    Types: Cigarettes    Quit date: 01/14/1993  . Smokeless tobacco: Former Systems developer  . Alcohol use No     Comment: Recovered ETOH    ALLERGIES:  is allergic to ephedrine; aspartame and phenylalanine; aspirin; crestor [rosuvastatin]; erythromycin; nsaids; and oxycodone.  MEDICATIONS:  Current Outpatient Prescriptions  Medication Sig Dispense Refill  . atorvastatin (LIPITOR) 80 MG tablet TAKE 1 TABLET(80 MG) BY MOUTH DAILY 30 tablet 3  . b complex vitamins capsule Take 1 capsule by mouth daily.    Marland Kitchen BAYER MICROLET LANCETS lancets USE AS DIRECTED TO CHECK BLOOD SUGAR TWICE DAILY 200 each 2  . bismuth subsalicylate (PEPTO BISMOL) 262 MG chewable tablet Chew 524 mg by mouth as needed.    . Blood Glucose Monitoring Suppl (CONTOUR NEXT EZ MONITOR) w/Device KIT USE AS DIRECTED TO CHECK BLOOD SUGAR TWICE DAILY 1 kit 2  . BYSTOLIC 10 MG tablet TAKE 1 TABLET BY MOUTH IN THE MORNING AND 2 TABLETS AFTER LUNCH 270 tablet 3  . CARTIA XT 240 MG 24 hr capsule TAKE 1 CAPSULE(240 MG) BY MOUTH DAILY 90 capsule 3  . chlorproMAZINE (THORAZINE) 25 MG tablet Take 25-50 mg by mouth as needed. Reported on 03/02/2015    . Cholecalciferol (VITAMIN D3) 2000 UNITS TABS Take 1 tablet by mouth daily.    . cloNIDine (CATAPRES) 0.1 MG tablet Take 1 tablet (0.1 mg total) by mouth 2  (two) times daily. 180 tablet 3  . co-enzyme Q-10 50 MG capsule Take 50 mg by mouth daily.    . diazepam (VALIUM) 10 MG tablet Take 2.5-10 mg by mouth as needed for anxiety.    . diphenoxylate-atropine (LOMOTIL) 2.5-0.025 MG tablet Take 1-2 tablets by mouth 3 times a day as needed for diarrhea 90 tablet 0  . fluticasone (FLONASE) 50 MCG/ACT nasal spray USE 2 SPRAYS IN EACH NOSTRIL TWICE DAILY 16 g 5  . gabapentin (NEURONTIN) 300 MG capsule TAKE 1 CAPSULE(300 MG) BY MOUTH THREE TIMES DAILY 90 capsule 11  . glucose blood (BAYER CONTOUR NEXT TEST) test strip USE AS DIRECTED TO CHECK BLOOD SUGAR TWICE DAILY 200 each 2  . Insulin Detemir (LEVEMIR) 100 UNIT/ML Pen Inject 32 Units into the skin daily at 10 pm. (Patient taking differently: Inject 35 Units into the skin daily at 10 pm. Uses 35 to 36 units) 15 mL 2  . Insulin Pen Needle (B-D UF III MINI PEN NEEDLES) 31G X 5 MM MISC Use to inject 2 times per day. 100 each 2  . INVOKANA 300 MG TABS tablet TAKE 1 TABLET(300 MG) BY MOUTH DAILY BEFORE BREAKFAST 30 tablet 3  . lamoTRIgine (LAMICTAL) 200 MG tablet Take 400 mg by mouth daily.    Marland Kitchen latanoprost (XALATAN) 0.005 % ophthalmic solution Place 1 drop into both eyes At bedtime.    Marland Kitchen letrozole (FEMARA) 2.5 MG tablet Take 1 tablet (2.5 mg total) by mouth daily. 30 tablet 12  . LEVEMIR FLEXTOUCH 100 UNIT/ML Pen INJECT 12 UNITS UNDER THE SKIN DAILY AT 10 PM 15 mL 0  . Liniments (SALONPAS PAIN RELIEF PATCH EX) Apply topically as needed. Reported on 03/02/2015    . lithium carbonate (ESKALITH) 450 MG CR tablet Take 900 mg by mouth at bedtime.    Marland Kitchen lurasidone (LATUDA) 40 MG TABS tablet Take 80 mg by mouth daily with breakfast.     .  Melatonin 10 MG CAPS Take 1 capsule by mouth at bedtime.    . mesalamine (LIALDA) 1.2 g EC tablet Take 2 tablets (2.4 g total) by mouth daily. 60 tablet 11  . Multiple Vitamin (MULTIVITAMIN) tablet Take 1 tablet by mouth daily.      . nitrofurantoin (MACRODANTIN) 100 MG capsule Take  100 mg by mouth as directed. Reported on 03/02/2015    . Omega-3 Krill Oil 500 MG CAPS Take 500 mg by mouth.    . ondansetron (ZOFRAN ODT) 4 MG disintegrating tablet Take 1 each morning then every 6 hours for nausea. 120 tablet 2  . ondansetron (ZOFRAN) 4 MG tablet Take 1 tablet (4 mg total) by mouth every 8 (eight) hours as needed for nausea or vomiting. 30 tablet 1  . pantoprazole (PROTONIX) 40 MG tablet TAKE 1 TABLET BY MOUTH TWICE DAILY 60 tablet 3  . Probiotic Product (PROBIOTIC DAILY PO) Take by mouth.    . ranitidine (ZANTAC) 300 MG tablet Take 0.5 tablets (150 mg total) by mouth 2 (two) times daily. 60 tablet 3  . Semaglutide (OZEMPIC) 0.25 or 0.5 MG/DOSE SOPN Inject 0.5 mg into the skin once a week. 1 pen 3  . Simethicone (GAS-X PO) Take 1 tablet by mouth 4 (four) times daily.    Marland Kitchen SYNTHROID 137 MCG tablet TAKE 2 TABLETS BY MOUTH DAILY BEFORE BREAKFAST. 60 tablet 5  . ticagrelor (BRILINTA) 60 MG TABS tablet Take 1 tablet (60 mg total) by mouth 2 (two) times daily. 180 tablet 3  . traZODone (DESYREL) 50 MG tablet Take 25 mg by mouth at bedtime.     . valACYclovir (VALTREX) 500 MG tablet Take 1 tablet (500 mg total) by mouth 2 (two) times daily. 60 tablet 11  . vitamin C (ASCORBIC ACID) 500 MG tablet Take 250 mg by mouth daily. Takes 2 a day    . Asenapine Maleate (SAPHRIS) 10 MG SUBL Place 10 mg under the tongue at bedtime.     Marland Kitchen loperamide (IMODIUM) 2 MG capsule Take 4 mg by mouth as needed for diarrhea or loose stools.     Current Facility-Administered Medications  Medication Dose Route Frequency Provider Last Rate Last Dose  . 0.9 %  sodium chloride infusion  500 mL Intravenous Continuous Irene Shipper, MD        PHYSICAL EXAMINATION: ECOG PERFORMANCE STATUS: 0 - Asymptomatic  BP 132/87   Pulse 80   Temp (!) 97.4 F (36.3 C) (Tympanic)   Resp 16   Wt 224 lb 1.6 oz (101.7 kg)   LMP 08/23/2006 (Approximate)   BMI 36.17 kg/m   Filed Weights   08/28/16 1530  Weight: 224 lb  1.6 oz (101.7 kg)    GENERAL: Well-nourished well-developed; Alert, no distress and comfortable.   Alone.  EYES: no pallor or icterus OROPHARYNX: no thrush or ulceration; good dentition  NECK: supple, no masses felt LYMPH:  no palpable lymphadenopathy in the cervical, axillary or inguinal regions LUNGS: clear to auscultation and  No wheeze or crackles HEART/CVS: regular rate & rhythm and no murmurs; No lower extremity edema ABDOMEN:abdomen soft, non-tender and normal bowel sounds Musculoskeletal:no cyanosis of digits and no clubbing  PSYCH: alert & oriented x 3 with fluent speech NEURO: no focal motor/sensory deficits SKIN:  no rashes or significant lesions .   LABORATORY DATA:  I have reviewed the data as listed    Component Value Date/Time   NA 138 08/28/2016 1458   K 4.3 08/28/2016 1458  CL 101 08/28/2016 1458   CO2 27 08/28/2016 1458   GLUCOSE 154 (H) 08/28/2016 1458   BUN 6 08/28/2016 1458   CREATININE 0.95 08/28/2016 1458   CALCIUM 10.1 08/28/2016 1458   PROT 7.6 08/28/2016 1458   ALBUMIN 4.4 08/28/2016 1458   AST 51 (H) 08/28/2016 1458   ALT 47 08/28/2016 1458   ALKPHOS 89 08/28/2016 1458   BILITOT 0.6 08/28/2016 1458   GFRNONAA >60 08/28/2016 1458   GFRAA >60 08/28/2016 1458    No results found for: SPEP, UPEP  Lab Results  Component Value Date   WBC 10.6 08/28/2016   NEUTROABS 7.4 (H) 08/28/2016   HGB 15.4 08/28/2016   HCT 46.2 08/28/2016   MCV 89.7 08/28/2016   PLT 444 (H) 08/28/2016      Chemistry      Component Value Date/Time   NA 138 08/28/2016 1458   K 4.3 08/28/2016 1458   CL 101 08/28/2016 1458   CO2 27 08/28/2016 1458   BUN 6 08/28/2016 1458   CREATININE 0.95 08/28/2016 1458      Component Value Date/Time   CALCIUM 10.1 08/28/2016 1458   ALKPHOS 89 08/28/2016 1458   AST 51 (H) 08/28/2016 1458   ALT 47 08/28/2016 1458   BILITOT 0.6 08/28/2016 1458       RADIOGRAPHIC STUDIES: I have personally reviewed the radiological images as  listed and agreed with the findings in the report. No results found.   ASSESSMENT & PLAN:  Carcinoma of overlapping sites of left breast in female, estrogen receptor positive ([place]) # LEFT Breast Stage I s/p mastec [prophy right mastec] on Low risk mammoprint- on Letrozle. NED.  # Ulcerative colitis/ polyps- s/p colonoscopy- april 2018 [Dr.Perry; [place]]; repeat colonoscopy in summer 2019.    # Arthritis- sec to AI. stable.    # BMD- July 2016; awaiting repeat BMD in oct 2018.   # follow up in 6 months/labs.   Dr. Levonne Spiller- Pscychiatrist; [place].    Orders Placed This Encounter  Procedures  . CBC with Differential/Platelet    Standing Status:   Future    Standing Expiration Date:   08/28/2017  . Comprehensive metabolic panel    Standing Status:   Future    Standing Expiration Date:   08/28/2017      Cammie Sickle, MD 08/30/2016 2:17 PM

## 2016-08-28 NOTE — Assessment & Plan Note (Addendum)
#   LEFT Breast Stage I s/p mastec [prophy right mastec] on Low risk mammoprint- on Letrozle. NED.  # Ulcerative colitis/ polyps- s/p colonoscopy- april 2018 [Dr.Perry; [place]]; repeat colonoscopy in summer 2019.    # Arthritis- sec to AI. stable.    # BMD- July 2016; awaiting repeat BMD in oct 2018.   # follow up in 6 months/labs.   Dr. Levonne Spiller- Pscychiatrist; [place].

## 2016-09-04 ENCOUNTER — Ambulatory Visit: Payer: BLUE CROSS/BLUE SHIELD | Admitting: Psychology

## 2016-09-05 DIAGNOSIS — F3174 Bipolar disorder, in full remission, most recent episode manic: Secondary | ICD-10-CM | POA: Diagnosis not present

## 2016-09-05 DIAGNOSIS — F3131 Bipolar disorder, current episode depressed, mild: Secondary | ICD-10-CM | POA: Diagnosis not present

## 2016-09-18 ENCOUNTER — Encounter: Payer: Self-pay | Admitting: Internal Medicine

## 2016-09-18 ENCOUNTER — Ambulatory Visit (INDEPENDENT_AMBULATORY_CARE_PROVIDER_SITE_OTHER): Payer: BLUE CROSS/BLUE SHIELD | Admitting: Internal Medicine

## 2016-09-18 VITALS — BP 104/78 | HR 64 | Ht 66.0 in | Wt 217.0 lb

## 2016-09-18 DIAGNOSIS — K5190 Ulcerative colitis, unspecified, without complications: Secondary | ICD-10-CM

## 2016-09-18 DIAGNOSIS — R112 Nausea with vomiting, unspecified: Secondary | ICD-10-CM

## 2016-09-18 DIAGNOSIS — K219 Gastro-esophageal reflux disease without esophagitis: Secondary | ICD-10-CM

## 2016-09-18 DIAGNOSIS — R197 Diarrhea, unspecified: Secondary | ICD-10-CM

## 2016-09-18 NOTE — Progress Notes (Signed)
HISTORY OF PRESENT ILLNESS:  Lindsay Fry is a 56 y.o. female with multiple significant medical and psychiatric problems. Followed in this office for a long history of ulcerative colitis. Previous patient of Dr. Olevia Perches. Also history of multiple adenomatous colon polyps for which she is under close surveillance. Seen by the GI physician assistant in July with complaints of nausea and vomiting as well as diarrhea. Felt to have reaction to lithium on empty stomach as well as possible diabetic gastroparesis. Terms of diarrhea, possibly metformin. She was advised to take lithium with food. Prescribed Zofran 3 times daily as needed. Advised regarding gastroparesis diet. With this combination she has had resolution of nausea and vomiting. As well she is pleased to have weight loss. Next, she did discontinue metformin. Diarrhea has resolved. She may have an occasional urgent bowel movement which is loose. She's had no bowel movement in 3 days. Typically having a bowel movement about every other day. No bleeding. Due for surveillance colonoscopy next May. Continues to work with her psychiatrist regarding medication adjustments. She is accompanied by her husband  REVIEW OF SYSTEMS:  All non-GI ROS negative at this time  Past Medical History:  Diagnosis Date  . ADD (attention deficit disorder) 11/19/2012  . Allergic rhinitis, cause unspecified   . Anxiety state, unspecified   . Arthritis   . Asymptomatic postmenopausal status (age-related) (natural)   . Atypical hyperplasia of left breast 2000  . Benign neoplasm of other and unspecified site of the digestive system   . Bipolar disorder ([place])   . Breast cancer ([place])    Left Breast 57mm invasive CA left uiq, dcis left uoq and a lot ADH, ALH in both breasts.  Marland Kitchen CAD (coronary artery disease)    a. 08/2014 NSTEMI/Cath: LM nl, LAD 50p, D1/D2 min irregs, LCX nl, OM1 40, LPDA nl, RI 99ost (2.54mm vessel->med Rx), RCA nl, AM 60, nl EF.  . Carcinoma of left  breast ([place]) 06/02/2014  . Chronic diastolic CHF (congestive heart failure) ([place])    a. 08/2014 Echo: EF 60-65%, Gr 1 DD, nl LA.  Marland Kitchen Cyst    on Achilles tendon  . Depression   . Diabetes mellitus ([place])    frank  . Dysuria   . Edema   . Family history of malignant neoplasm of gastrointestinal tract   . Fibromyalgia   . GERD (gastroesophageal reflux disease)   . Glaucoma    pre glaucoma  . Headache    migraines  . Hiatal hernia   . History of alcoholism ([place])   . History of migraines   . History of ovarian cyst   . Insomnia, unspecified   . Myocardial infarction ([place]) 09-04-14  . OCD (obsessive compulsive disorder)   . Other screening mammogram   . Pure hypercholesterolemia   . Rosacea   . Subacute confusional state 11/19/2012  . Tubular adenoma of colon 01/10/11  . Ulcerative colitis, unspecified   . Unspecified asthma(493.90)   . Unspecified essential hypertension   . Unspecified hypothyroidism   . Unspecified vitamin D deficiency   . Vertigo     Past Surgical History:  Procedure Laterality Date  . APPENDECTOMY    . AXILLARY SENTINEL NODE BIOPSY Left 05/23/2014   Procedure: AXILLARY SENTINEL NODE BIOPSY;  Surgeon: Christene Lye, MD;  Location: [facility] ORS;  Service: General;  Laterality: Left;  . BREAST BIOPSY  Aug 2000   on tamoxifen, atypical hyperplasia  . BREAST SURGERY Left Aug 2000   lumpectomy/ Dr Sharlet Salina  .  BREAST SURGERY Bilateral 05/23/14   Mastectomy  . BUNIONECTOMY Right   . CARDIAC CATHETERIZATION N/A 09/05/2014   Procedure: Left Heart Cath and Coronary Angiography;  Surgeon: Wellington Hampshire, MD;  Location: [facility];  Service: Cardiovascular;  Laterality: N/A;  . CARDIAC CATHETERIZATION  09-05-14  . CESAREAN SECTION  1996/1997   placenta previa, gest DM, pre-eclampsia  . cholecystect  1999  . CHOLECYSTECTOMY  1997   adhesions also  . COLONOSCOPY  01/2001   Ulcerative colitis  . COLONOSCOPY  04/2004   UC, polyp  . COLONOSCOPY  12/08    UC, no polyps  . DEXA  04/1999 and 2010   normal  . ESOPHAGOGASTRODUODENOSCOPY  09/2001   polyp  . exercise stress test  11/2003   negative  . FEMUR FRACTURE SURGERY Left   . MASTECTOMY Bilateral 05-23-14   Dr. Jamal Collin  . MECKEL DIVERTICULUM EXCISION  1999  . NASAL SINUS SURGERY  05/1997  . nuclear stress test  06/2007   negative  . precancerous mole removed    . SIMPLE MASTECTOMY WITH AXILLARY SENTINEL NODE BIOPSY Bilateral 05/23/2014   Procedure: Bilateral simple mastectomy, left sentinel node biopsy ;  Surgeon: Christene Lye, MD;  Location: [facility] ORS;  Service: General;  Laterality: Bilateral;  . Sleep study  11/09   no apnea, but did snore (done by [facility])  . TONSILLECTOMY  1984  . TUBAL LIGATION    . WISDOM TOOTH EXTRACTION  1980's    Social History [place]  reports that she quit smoking about 23 years ago. Her smoking use included Cigarettes. She has a 1.25 pack-year smoking history. She has quit using smokeless tobacco. She reports that she does not drink alcohol or use drugs.  family history includes Alzheimer's disease in her father; Breast cancer in her unknown relative; Colitis in her mother; Colon cancer in her father; Coronary artery disease in her father, mother, and unknown relative; Crohn's disease in her mother; Dementia in her father; Diabetes in her unknown relative; Hypertension in her mother; Osteoporosis in her mother; Stroke in her cousin.  Allergies  Allergen Reactions  . Ephedrine Other (See Comments)    Pt becomes hyper  . Aspartame And Phenylalanine Nausea Only  . Aspirin     REACTION: aggrivates colitis  . Crestor [Rosuvastatin]     REACTION: increased lfts  . Erythromycin     REACTION: GI upset  . Nsaids     REACTION: aggrivate colitis  . Oxycodone Other (See Comments)    Panic Attack       PHYSICAL EXAMINATION: Vital signs: BP 104/78   Pulse 64   Ht 5\' 6"  (1.676 m)   Wt 217 lb (98.4 kg)   LMP 08/23/2006 (Approximate)    BMI 35.02 kg/m   Constitutional: generally well-appearing, no acute distress Psychiatric: alert and oriented x3, cooperative Eyes: extraocular movements intact, anicteric, conjunctiva pink Mouth: oral pharynx moist, no lesions Neck: supple no lymphadenopathy Cardiovascular: heart regular rate and rhythm, no murmur Lungs: clear to auscultation bilaterally Abdomen: soft,Obese, nontender, nondistended, no obvious ascites, no peritoneal signs, normal bowel sounds, no organomegaly Rectal:Omitted Extremities: no clubbing, cyanosis, or lower extremity edema bilaterally Skin: no lesions on visible extremities Neuro: No focal deficits. No asterixis.    ASSESSMENT:  #1. Long-standing ulcerative colitis. In remission #2. History of multiple adenomatous colon polyps. Under close surveillance #3. Problems with nausea and vomiting multifactorial. Improved #4. Problems with diarrhea resolved. Metformin related #5. Multiple medical problems  PLAN:  #1. Continue Lialda 2.4 g daily #2. Continue as needed medications, as needed #3. Continue with gastroparesis diet #4. Surveillance colonoscopy next day #5. Interval follow-up as needed   15 minutes spent face-to-face with the patient. Greater than 50% a time use for counseling regarding management of her nausea and vomiting, problems with altered bowel habits, and Crohn's disease

## 2016-09-18 NOTE — Patient Instructions (Signed)
Please follow up as needed 

## 2016-09-23 ENCOUNTER — Telehealth: Payer: Self-pay | Admitting: Endocrinology

## 2016-09-23 ENCOUNTER — Ambulatory Visit: Payer: Self-pay | Admitting: Endocrinology

## 2016-09-23 NOTE — Telephone Encounter (Signed)
Orders are already in the system for [place] [facility]

## 2016-09-23 NOTE — Telephone Encounter (Signed)
Patient called in reference to not having labs done for appointment today with Dr. Dwyane Dee. Rescheduled patient for Thursday 09/26/16. Patient needs labs ordered to [facility] at [facility]. Patient stated she usually goes here for her labs. Please call and advise.

## 2016-09-24 ENCOUNTER — Ambulatory Visit
Admission: RE | Admit: 2016-09-24 | Discharge: 2016-09-24 | Disposition: A | Discharge status: Home / Self Care | Payer: BLUE CROSS/BLUE SHIELD | Source: Ambulatory Visit | Attending: Family Medicine | Admitting: Family Medicine

## 2016-09-24 DIAGNOSIS — E2839 Other primary ovarian failure: Secondary | ICD-10-CM | POA: Insufficient documentation

## 2016-09-24 DIAGNOSIS — M85852 Other specified disorders of bone density and structure, left thigh: Secondary | ICD-10-CM | POA: Insufficient documentation

## 2016-09-24 DIAGNOSIS — Z780 Asymptomatic menopausal state: Secondary | ICD-10-CM | POA: Diagnosis not present

## 2016-09-25 ENCOUNTER — Encounter: Payer: Self-pay | Admitting: Family Medicine

## 2016-09-25 DIAGNOSIS — M8580 Other specified disorders of bone density and structure, unspecified site: Secondary | ICD-10-CM

## 2016-09-25 HISTORY — DX: Other specified disorders of bone density and structure, unspecified site: M85.80

## 2016-09-26 ENCOUNTER — Ambulatory Visit: Payer: Self-pay | Admitting: Endocrinology

## 2016-10-02 DIAGNOSIS — E119 Type 2 diabetes mellitus without complications: Secondary | ICD-10-CM | POA: Diagnosis not present

## 2016-10-02 DIAGNOSIS — H2513 Age-related nuclear cataract, bilateral: Secondary | ICD-10-CM | POA: Diagnosis not present

## 2016-10-02 DIAGNOSIS — H5201 Hypermetropia, right eye: Secondary | ICD-10-CM | POA: Diagnosis not present

## 2016-10-02 DIAGNOSIS — H40051 Ocular hypertension, right eye: Secondary | ICD-10-CM | POA: Diagnosis not present

## 2016-10-02 LAB — HM DIABETES EYE EXAM

## 2016-10-03 ENCOUNTER — Encounter: Payer: Self-pay | Admitting: Internal Medicine

## 2016-10-07 ENCOUNTER — Ambulatory Visit (INDEPENDENT_AMBULATORY_CARE_PROVIDER_SITE_OTHER): Payer: BLUE CROSS/BLUE SHIELD | Admitting: Psychology

## 2016-10-07 DIAGNOSIS — F3131 Bipolar disorder, current episode depressed, mild: Secondary | ICD-10-CM

## 2016-10-08 ENCOUNTER — Encounter: Payer: Self-pay | Admitting: *Deleted

## 2016-10-08 ENCOUNTER — Encounter: Payer: Self-pay | Admitting: Cardiovascular Disease

## 2016-10-08 NOTE — Telephone Encounter (Signed)
Medication Samples have been placed up front for patient to pick up.  Drug name: Brilinta       Strength: 60 mg        Qty: 1 box  LOT: [id number]  Exp.Date: 5/20

## 2016-10-09 ENCOUNTER — Encounter: Payer: Self-pay | Admitting: Internal Medicine

## 2016-10-09 ENCOUNTER — Other Ambulatory Visit: Payer: Self-pay

## 2016-10-09 MED ORDER — LINACLOTIDE 145 MCG PO CAPS: 145.0000 ug | ORAL_CAPSULE | Freq: Every day | ORAL | 3 refills | Status: DC

## 2016-10-09 NOTE — Telephone Encounter (Signed)
Medication Samples have been provided to the patient.  Drug name: Brilinta       Strength: 60 mg        Qty: 3 boxes  LOT: [id number]  Exp.Date: 10-20  Assistance forms also provided to patient as well.

## 2016-10-10 ENCOUNTER — Encounter: Payer: Self-pay | Admitting: Endocrinology

## 2016-10-11 ENCOUNTER — Telehealth: Payer: Self-pay

## 2016-10-11 NOTE — Telephone Encounter (Signed)
She can get a pneumovax 23 unless she has had it somewhere else since then  If she needs a flu shot- can have it at the same time Thanks

## 2016-10-11 NOTE — Telephone Encounter (Signed)
Pt wants to know if pt needs pneumonia shot; per immunization list pt had pneumovax 06/18/2007. Pt last seen f/u on 03/19/16; do not see CPX.Please advise.

## 2016-10-14 ENCOUNTER — Telehealth: Payer: Self-pay

## 2016-10-14 ENCOUNTER — Other Ambulatory Visit: Payer: Self-pay | Admitting: Cardiovascular Disease

## 2016-10-14 NOTE — Telephone Encounter (Signed)
Dr. Dwyane Dee, please update lab orders for patient to have done. She will have drawn at [facility] for her upcoming appointment. Thank you!

## 2016-10-14 NOTE — Telephone Encounter (Signed)
Pt notified she can have both vaccines at her flu shot appt tomorrow, Doristine Counter said she will take care of the scheduling

## 2016-10-15 ENCOUNTER — Ambulatory Visit (INDEPENDENT_AMBULATORY_CARE_PROVIDER_SITE_OTHER): Payer: BLUE CROSS/BLUE SHIELD

## 2016-10-15 ENCOUNTER — Other Ambulatory Visit: Payer: Self-pay | Admitting: Endocrinology

## 2016-10-15 ENCOUNTER — Ambulatory Visit: Payer: BLUE CROSS/BLUE SHIELD

## 2016-10-15 DIAGNOSIS — Z23 Encounter for immunization: Secondary | ICD-10-CM

## 2016-10-16 ENCOUNTER — Encounter: Payer: Self-pay | Admitting: Endocrinology

## 2016-10-19 ENCOUNTER — Other Ambulatory Visit: Payer: Self-pay | Admitting: Endocrinology

## 2016-10-23 ENCOUNTER — Other Ambulatory Visit: Payer: Self-pay | Admitting: Endocrinology

## 2016-10-25 ENCOUNTER — Other Ambulatory Visit (INDEPENDENT_AMBULATORY_CARE_PROVIDER_SITE_OTHER): Payer: BLUE CROSS/BLUE SHIELD

## 2016-10-25 DIAGNOSIS — Z794 Long term (current) use of insulin: Secondary | ICD-10-CM

## 2016-10-25 DIAGNOSIS — C50111 Malignant neoplasm of central portion of right female breast: Secondary | ICD-10-CM | POA: Diagnosis not present

## 2016-10-25 DIAGNOSIS — E1165 Type 2 diabetes mellitus with hyperglycemia: Secondary | ICD-10-CM | POA: Diagnosis not present

## 2016-10-25 DIAGNOSIS — C50112 Malignant neoplasm of central portion of left female breast: Secondary | ICD-10-CM | POA: Diagnosis not present

## 2016-10-25 DIAGNOSIS — Z9013 Acquired absence of bilateral breasts and nipples: Secondary | ICD-10-CM | POA: Diagnosis not present

## 2016-10-25 LAB — COMPREHENSIVE METABOLIC PANEL
ALT: 45 U/L — ABNORMAL HIGH (ref 0–35)
AST: 36 U/L (ref 0–37)
Albumin: 4.5 g/dL (ref 3.5–5.2)
Alkaline Phosphatase: 98 U/L (ref 39–117)
BUN: 8 mg/dL (ref 6–23)
CO2: 29 mEq/L (ref 19–32)
Calcium: 10.3 mg/dL (ref 8.4–10.5)
Chloride: 102 mEq/L (ref 96–112)
Creatinine, Ser: 0.96 mg/dL (ref 0.40–1.20)
GFR: 63.39 mL/min (ref >60.00)
Glucose, Bld: 122 mg/dL — ABNORMAL HIGH (ref 70–99)
Potassium: 4.2 mEq/L (ref 3.5–5.1)
Sodium: 140 mEq/L (ref 135–145)
Total Bilirubin: 0.6 mg/dL (ref 0.2–1.2)
Total Protein: 7.3 g/dL (ref 6.0–8.3)

## 2016-10-25 LAB — T4, FREE: Free T4: 1.76 ng/dL — ABNORMAL HIGH (ref 0.60–1.60)

## 2016-10-25 LAB — TSH: TSH: 0.03 u[IU]/mL — ABNORMAL LOW (ref 0.35–4.50)

## 2016-10-25 LAB — HEMOGLOBIN A1C: Hgb A1c MFr Bld: 6.4 % (ref 4.6–6.5)

## 2016-10-27 ENCOUNTER — Other Ambulatory Visit: Payer: Self-pay | Admitting: Endocrinology

## 2016-10-27 LAB — PARATHYROID HORMONE, INTACT (NO CA): PTH: 18 pg/mL (ref 15–65)

## 2016-11-01 ENCOUNTER — Ambulatory Visit (INDEPENDENT_AMBULATORY_CARE_PROVIDER_SITE_OTHER): Payer: BLUE CROSS/BLUE SHIELD | Admitting: Endocrinology

## 2016-11-01 ENCOUNTER — Encounter: Payer: Self-pay | Admitting: Endocrinology

## 2016-11-01 VITALS — BP 118/82 | HR 82 | Wt 209.1 lb

## 2016-11-01 DIAGNOSIS — E890 Postprocedural hypothyroidism: Secondary | ICD-10-CM

## 2016-11-01 DIAGNOSIS — E119 Type 2 diabetes mellitus without complications: Secondary | ICD-10-CM

## 2016-11-01 MED ORDER — LEVOTHYROXINE SODIUM 112 MCG PO TABS: 224.0000 ug | ORAL_TABLET | Freq: Every day | ORAL | 3 refills | Status: DC

## 2016-11-01 NOTE — Progress Notes (Addendum)
Patient ID: Lindsay Fry, female   DOB: [birth year], 56 y.o.   MRN: [id number]   Reason for Appointment:  Hypothyroidism and diabetes, followup visit  History of Present Illness:  DIABETES:  Prior history: With her high A1c of 6.7 in 03/2014 she had an abnormal glucose tolerance test indicating diabetes and 1 hour glucose of 300 She was started on metformin; however even with 1500 mg of metformin ER  blood sugars were not controlled especially fasting Subsequently with taking Janumet XR 100/1000 daily her blood sugars were much better Because of her weight gain and A1c going up to 7.3 along with occasional readings over 200 she was started on Victoza in 11/16 She has had progressive hyperglycemia previously and A1c was up to 8.6% in 4/17  She was started on Levemir insulin in 08/2015 because of progressive rise in blood sugars and inability to control with 3 other agents  Non-insulin hypoglycemic drugs: Ozempic 0.5 mg weekly metformin ER 2000 mg daily, Invokana 300 mg daily  INSULIN regimen: Levemir 40 units at bedtime  A1c is now 6.4, previously 6.8  Current management, blood sugar patterns and problems identified:  She has increased the dose up to 40 units of her Levemir to keep up with her fasting blood sugars  More recently her FASTING blood sugars are much improved with the range of 112-150  She has done a few readings after meals also and these are not much higher than her fasting readings, averaging 133 in the evenings  She still tries to watch her diet and avoid sweets and high-fat or high carbohydrate meals; now she thinks that her craving for sweets and also sweet drinks as much less and she is finding alternatives with reduced carbohydrate and sugar content  Trying to exercise now with more walking and occasionally walking in the pool on the left than before  GLUCOSE readings from review of CONTOUR monitor  OVERALL checking once a day on an average with the  range of 92-1 53 Average blood sugar at any given time ranges from 119 up to 134, highest fasting   Weight history:  Wt Readings from Last 3 Encounters:  11/01/16 209 lb 2 oz (94.9 kg)  09/18/16 217 lb (98.4 kg)  08/28/16 224 lb 1.6 oz (101.7 kg)   Previous consultation with dietitian: none   Exercise regimen: Walking orxercises 3/7 days    LABS:  Lab Results  Component Value Date   HGBA1C 6.4 10/25/2016   HGBA1C 6.8 (H) 05/21/2016   HGBA1C 6.6 (H) 02/08/2016   Lab Results  Component Value Date   MICROALBUR 13.3 (H) 11/10/2015   LDLCALC 65 10/13/2014   CREATININE 0.96 10/25/2016     HYPOTHYROIDISM: This was first diagnosed in 1992 after her treatment for Graves' disease with I-131 She has been on relatively large doses of thyroxine supplements She was on 224 mcg since 11/13 and her TSH was normal in 3/14 Subjectively difficult to assess her thyroid since she tends to have fatigue chronically.  In 2014 her dose was reduced to 200 mcg daily and her dose has been fluctuating since then She had required periodic increase in dosage in 05/2014 when her TSH was 20  She is  on 2 tablets of 137 g daily, continues on the brand name Synthroid  Her symptoms are usually nonspecific for hypothyroidism, mostly related to other medical problems including sleep disturbances and bipolar illness.  No unusual fatigue recently  She has been quite regular with the  Synthroid in the mornings         TSH is trending lower over the last 3 measurements and is now 0.27   Lab Results  Component Value Date   TSH 0.03 (L) 10/25/2016   TSH 0.27 (L) 05/21/2016   TSH 0.85 03/22/2016   FREET4 1.76 (H) 10/25/2016   FREET4 0.96 11/10/2015   FREET4 1.11 04/25/2015     No visits with results within 1 Week(s) from this visit.  Latest known visit with results is:  Lab on 10/25/2016  Component Date Value Ref Range Status  . Hgb A1c MFr Bld 10/25/2016 6.4  4.6 - 6.5 % Final   Glycemic Control  Guidelines for People with Diabetes:Non Diabetic:  <6%Goal of Therapy: <7%Additional Action Suggested:  >8%   . Sodium 10/25/2016 140  135 - 145 mEq/L Final  . Potassium 10/25/2016 4.2  3.5 - 5.1 mEq/L Final  . Chloride 10/25/2016 102  96 - 112 mEq/L Final  . CO2 10/25/2016 29  19 - 32 mEq/L Final  . Glucose, Bld 10/25/2016 122* 70 - 99 mg/dL Final  . BUN 35/59/7416 8  6 - 23 mg/dL Final  . Creatinine, Ser 10/25/2016 0.96  0.40 - 1.20 mg/dL Final  . Total Bilirubin 10/25/2016 0.6  0.2 - 1.2 mg/dL Final  . Alkaline Phosphatase 10/25/2016 98  39 - 117 U/L Final  . AST 10/25/2016 36  0 - 37 U/L Final  . ALT 10/25/2016 45* 0 - 35 U/L Final  . Total Protein 10/25/2016 7.3  6.0 - 8.3 g/dL Final  . Albumin 38/45/3646 4.5  3.5 - 5.2 g/dL Final  . Calcium 80/32/1224 10.3  8.4 - 10.5 mg/dL Final  . GFR 82/50/0370 63.39  >60.00 mL/min Final  . TSH 10/25/2016 0.03* 0.35 - 4.50 uIU/mL Final  . Free T4 10/25/2016 1.76* 0.60 - 1.60 ng/dL Final   Comment: Specimens from patients who are undergoing biotin therapy and /or ingesting biotin supplements may contain high levels of biotin.  The higher biotin concentration in these specimens interferes with this Free T4 assay.  Specimens that contain high levels  of biotin may cause false high results for this Free T4 assay.  Please interpret results in light of the total clinical presentation of the patient.    Marland Kitchen PTH 10/25/2016 18  15 - 65 pg/mL Final    Allergies as of 11/01/2016      Reactions   Ephedrine Other (See Comments)   Pt becomes hyper   Aspartame And Phenylalanine Nausea Only   Aspirin    REACTION: aggrivates colitis   Crestor [rosuvastatin]    REACTION: increased lfts   Erythromycin    REACTION: GI upset   Nsaids    REACTION: aggrivate colitis   Oxycodone Other (See Comments)   Panic Attack      Medication List       Accurate as of 11/01/16 11:59 PM. Always use your most recent med list.          amantadine 100 MG  capsule Commonly known as:  SYMMETREL Take 100 mg by mouth 2 (two) times daily.   atorvastatin 80 MG tablet Commonly known as:  LIPITOR TAKE 1 TABLET(80 MG) BY MOUTH DAILY   b complex vitamins capsule Take 1 capsule by mouth daily.   B-D UF III MINI PEN NEEDLES 31G X 5 MM Misc Generic drug:  Insulin Pen Needle USE FOR INJECTIONS TWICE DAILY   BAYER MICROLET LANCETS lancets USE AS DIRECTED TO CHECK BLOOD  SUGAR TWICE DAILY   bismuth subsalicylate 244 MG chewable tablet Commonly known as:  PEPTO BISMOL Chew 524 mg by mouth as needed.   BYSTOLIC 10 MG tablet Generic drug:  nebivolol TAKE 1 TABLET BY MOUTH IN THE MORNING AND 2 TABLETS AFTER LUNCH   CARTIA XT 240 MG 24 hr capsule Generic drug:  diltiazem TAKE 1 CAPSULE(240 MG) BY MOUTH DAILY   chlorproMAZINE 25 MG tablet Commonly known as:  THORAZINE Take 25-50 mg by mouth as needed. Reported on 03/02/2015   cloNIDine 0.1 MG tablet Commonly known as:  CATAPRES Take 1 tablet (0.1 mg total) by mouth 2 (two) times daily.   co-enzyme Q-10 50 MG capsule Take 50 mg by mouth daily.   CONTOUR NEXT EZ MONITOR w/Device Kit USE AS DIRECTED TO CHECK BLOOD SUGAR TWICE DAILY   diazepam 10 MG tablet Commonly known as:  VALIUM Take 2.5-10 mg by mouth as needed for anxiety.   diphenoxylate-atropine 2.5-0.025 MG tablet Commonly known as:  LOMOTIL Take 1-2 tablets by mouth 3 times a day as needed for diarrhea   fluticasone 50 MCG/ACT nasal spray Commonly known as:  FLONASE USE 2 SPRAYS IN EACH NOSTRIL TWICE DAILY   gabapentin 300 MG capsule Commonly known as:  NEURONTIN TAKE 1 CAPSULE(300 MG) BY MOUTH THREE TIMES DAILY   GAS-X PO Take 1 tablet by mouth 4 (four) times daily.   glucose blood test strip Commonly known as:  BAYER CONTOUR NEXT TEST USE AS DIRECTED TO CHECK BLOOD SUGAR TWICE DAILY   Insulin Detemir 100 UNIT/ML Pen Commonly known as:  LEVEMIR Inject 32 Units into the skin daily at 10 pm.   LEVEMIR FLEXTOUCH 100  UNIT/ML Pen Generic drug:  Insulin Detemir INJECT 12 UNITS UNDER THE SKIN DAILY AT 10 PM   INVOKANA 300 MG Tabs tablet Generic drug:  canagliflozin TAKE 1 TABLET(300 MG) BY MOUTH DAILY BEFORE BREAKFAST   lamoTRIgine 200 MG tablet Commonly known as:  LAMICTAL Take 400 mg by mouth daily.   latanoprost 0.005 % ophthalmic solution Commonly known as:  XALATAN Place 1 drop into both eyes At bedtime.   LATUDA 40 MG Tabs tablet Generic drug:  lurasidone Take 80 mg by mouth daily with breakfast.   letrozole 2.5 MG tablet Commonly known as:  FEMARA Take 1 tablet (2.5 mg total) by mouth daily.   levothyroxine 112 MCG tablet Commonly known as:  SYNTHROID Take 2 tablets (224 mcg total) by mouth daily before breakfast.   linaclotide 145 MCG Caps capsule Commonly known as:  LINZESS Take 1 capsule (145 mcg total) by mouth daily before breakfast.   lithium carbonate 450 MG CR tablet Commonly known as:  ESKALITH Take 900 mg by mouth at bedtime.   Melatonin 10 MG Caps Take 1 capsule by mouth at bedtime.   mesalamine 1.2 g EC tablet Commonly known as:  LIALDA Take 2 tablets (2.4 g total) by mouth daily.   multivitamin tablet Take 1 tablet by mouth daily.   nitrofurantoin 100 MG capsule Commonly known as:  MACRODANTIN Take 100 mg by mouth as directed. Reported on 03/02/2015   Omega-3 Krill Oil 500 MG Caps Take 500 mg by mouth.   ondansetron 4 MG disintegrating tablet Commonly known as:  ZOFRAN ODT Take 1 each morning then every 6 hours for nausea.   OZEMPIC 0.25 or 0.5 MG/DOSE Sopn Generic drug:  Semaglutide INJECT 0.5 MG UNDER THE SKIN ONCE A WEEK   pantoprazole 40 MG tablet Commonly known as:  PROTONIX  TAKE 1 TABLET BY MOUTH TWICE DAILY   PROBIOTIC DAILY PO Take by mouth.   ranitidine 300 MG tablet Commonly known as:  ZANTAC Take 0.5 tablets (150 mg total) by mouth 2 (two) times daily.   SALONPAS PAIN RELIEF PATCH EX Apply topically as needed. Reported on  03/02/2015   SAPHRIS 10 MG Subl Generic drug:  Asenapine Maleate Place 10 mg under the tongue at bedtime.   ticagrelor 60 MG Tabs tablet Commonly known as:  BRILINTA Take 1 tablet (60 mg total) by mouth 2 (two) times daily.   traZODone 50 MG tablet Commonly known as:  DESYREL Take 300 mg by mouth at bedtime.   valACYclovir 500 MG tablet Commonly known as:  VALTREX Take 1 tablet (500 mg total) by mouth 2 (two) times daily.   vitamin C 500 MG tablet Commonly known as:  ASCORBIC ACID Take 250 mg by mouth daily. Takes 2 a day   Vitamin D3 2000 units Tabs Take 1 tablet by mouth daily.       Allergies:  Allergies  Allergen Reactions  . Ephedrine Other (See Comments)    Pt becomes hyper  . Aspartame And Phenylalanine Nausea Only  . Aspirin     REACTION: aggrivates colitis  . Crestor [Rosuvastatin]     REACTION: increased lfts  . Erythromycin     REACTION: GI upset  . Nsaids     REACTION: aggrivate colitis  . Oxycodone Other (See Comments)    Panic Attack    Past Medical History:  Diagnosis Date  . ADD (attention deficit disorder) 11/19/2012  . Allergic rhinitis, cause unspecified   . Anxiety state, unspecified   . Arthritis   . Asymptomatic postmenopausal status (age-related) (natural)   . Atypical hyperplasia of left breast 2000  . Benign neoplasm of other and unspecified site of the digestive system   . Bipolar disorder ([place])   . Breast cancer ([place])    Left Breast 67m invasive CA left uiq, dcis left uoq and a lot ADH, ALH in both breasts.  .Marland KitchenCAD (coronary artery disease)    a. 08/2014 NSTEMI/Cath: LM nl, LAD 50p, D1/D2 min irregs, LCX nl, OM1 40, LPDA nl, RI 99ost (2.017mvessel->med Rx), RCA nl, AM 60, nl EF.  . Carcinoma of left breast ([place]5/19/2016  . Chronic diastolic CHF (congestive heart failure) ([place]   a. 08/2014 Echo: EF 60-65%, Gr 1 DD, nl LA.  . Marland Kitchenyst    on Achilles tendon  . Depression   . Diabetes mellitus ([place]   frank  . Dysuria   . Edema    . Family history of malignant neoplasm of gastrointestinal tract   . Fibromyalgia   . GERD (gastroesophageal reflux disease)   . Glaucoma    pre glaucoma  . Headache    migraines  . Hiatal hernia   . History of alcoholism ([place]  . History of migraines   . History of ovarian cyst   . Insomnia, unspecified   . Myocardial infarction ([facility]16  . OCD (obsessive compulsive disorder)   . Osteopenia 09/25/2016   Femoral neck T -1.1  9/18  . Other screening mammogram   . Pure hypercholesterolemia   . Rosacea   . Subacute confusional state 11/19/2012  . Tubular adenoma of colon 01/10/11  . Ulcerative colitis, unspecified   . Unspecified asthma(493.90)   . Unspecified essential hypertension   . Unspecified hypothyroidism   . Unspecified vitamin D deficiency   . Vertigo  Past Surgical History:  Procedure Laterality Date  . APPENDECTOMY    . AXILLARY SENTINEL NODE BIOPSY Left 05/23/2014   Procedure: AXILLARY SENTINEL NODE BIOPSY;  Surgeon: Christene Lye, MD;  Location: [facility] ORS;  Service: General;  Laterality: Left;  . BREAST BIOPSY  Aug 2000   on tamoxifen, atypical hyperplasia  . BREAST SURGERY Left Aug 2000   lumpectomy/ Dr Sharlet Salina  . BREAST SURGERY Bilateral 05/23/14   Mastectomy  . BUNIONECTOMY Right   . CARDIAC CATHETERIZATION N/A 09/05/2014   Procedure: Left Heart Cath and Coronary Angiography;  Surgeon: Wellington Hampshire, MD;  Location: [facility];  Service: Cardiovascular;  Laterality: N/A;  . CARDIAC CATHETERIZATION  09-05-14  . CESAREAN SECTION  1996/1997   placenta previa, gest DM, pre-eclampsia  . cholecystect  1999  . CHOLECYSTECTOMY  1997   adhesions also  . COLONOSCOPY  01/2001   Ulcerative colitis  . COLONOSCOPY  04/2004   UC, polyp  . COLONOSCOPY  12/08   UC, no polyps  . DEXA  04/1999 and 2010   normal  . ESOPHAGOGASTRODUODENOSCOPY  09/2001   polyp  . exercise stress test  11/2003   negative  . FEMUR FRACTURE SURGERY Left   .  MASTECTOMY Bilateral 05-23-14   Dr. Jamal Collin  . MECKEL DIVERTICULUM EXCISION  1999  . NASAL SINUS SURGERY  05/1997  . nuclear stress test  06/2007   negative  . precancerous mole removed    . SIMPLE MASTECTOMY WITH AXILLARY SENTINEL NODE BIOPSY Bilateral 05/23/2014   Procedure: Bilateral simple mastectomy, left sentinel node biopsy ;  Surgeon: Christene Lye, MD;  Location: [facility] ORS;  Service: General;  Laterality: Bilateral;  . Sleep study  11/09   no apnea, but did snore (done by [facility])  . TONSILLECTOMY  1984  . TUBAL LIGATION    . WISDOM TOOTH EXTRACTION  1980's    Family History  Problem Relation Age of Onset  . Coronary artery disease Father   . Colon cancer Father   . Alzheimer's disease Father   . Dementia Father   . Coronary artery disease Mother   . Hypertension Mother   . Osteoporosis Mother   . Colitis Mother   . Crohn's disease Mother   . Breast cancer Unknown        great aunts  . Coronary artery disease Unknown        Uncle (also AAA)  . Diabetes Unknown        remote family history  . Stroke Cousin   . Esophageal cancer Neg Hx   . Rectal cancer Neg Hx   . Stomach cancer Neg Hx     Social History:  reports that she quit smoking about 23 years ago. Her smoking use included Cigarettes. She has a 1.25 pack-year smoking history. She has quit using smokeless tobacco. She reports that she does not drink alcohol or use drugs.  REVIEW Of SYSTEMS:   HYPERCALCEMIA: Her calcium has been Persistently mildly increased, upper normal now PTH in the past has been borderline for hyperparathyroidism and is now below 20  Calcium supplements: Recently none She is taking OTC vitamin D3, 2000 units  Her level will need to be checked again  Bone density in 9/15 shows T score -1.1 at the hip  Lab Results  Component Value Date   PTH 18 10/25/2016   CALCIUM 10.3 10/25/2016   PHOS 2.3 01/29/2007     Lab Results  Component Value Date  CALCIUM 10.3 10/25/2016    PHOS 2.3 01/29/2007   Lab Results  Component Value Date   VD25OH 44 05/12/2013   VD25OH 47 05/19/2012   VD25OH 42 04/23/2011     HYPERTENSION: Blood pressure is controlled, managed by cardiologist.   Currently on low-dose clonidine, Bystolic twice a day and diltiazem    Lab Results  Component Value Date   CREATININE 0.96 10/25/2016   BUN 8 10/25/2016   NA 140 10/25/2016   K 4.2 10/25/2016   CL 102 10/25/2016   CO2 29 10/25/2016    History of hypercholesterolemia: Management by cardiologist and has been on 80 mg total since her MI   Lab Results  Component Value Date   CHOL 121 11/10/2015   HDL 36.50 (L) 11/10/2015   LDLCALC 65 10/13/2014   LDLDIRECT 67.0 11/10/2015   TRIG 217.0 (H) 11/10/2015   CHOLHDL 3 11/10/2015    Her gastroenterologist feels that ALT is high from fatty liver or colitis   Lab Results  Component Value Date   ALT 45 (H) 10/25/2016      Examination:   BP 118/82 (BP Location: Left Arm, Patient Position: Sitting, Cuff Size: Large)   Pulse 82   Wt 209 lb 2 oz (94.9 kg)   LMP 08/23/2006 (Approximate)   SpO2 96%   BMI 33.75 kg/m    Her face is cushingoid Neck exam normal Mild relatively coarse tremor present Biceps reflexes are brisk     Assessments   DIABETES:  See history of present illness for detailed discussion of his current management, blood sugar patterns and problems identified  Her blood sugars are well controlled with A1c 6.4 which is better than usual This is without much increase in her insulin and is likely to be related to her improving her diet considerably, switching to Ozempic and losing weight Blood sugars are fairly good throughout the day including fasting which are mildly increased She is trying to exercise and probably can do more For now she will continue her basal insulin, Ozempic and Invokana unchanged  Hypothyroidism, post ablative: Subjectively she is doing fairly well  Possibly because of her  significant weight loss her thyroid levels are significantly high although she is asymptomatic with her high free T4 and suppressed TSH  She will now reduce her dose to 112 g, 2 tablets daily instead of a total of 274 Needs follow-up in 3 months at least  HYPERCALCEMIA: Calcium is upper normal now consistently and etiology unclear as his PTH is below 20 Vitamin D level will be periodically to be monitored, will order for the next visit and meanwhile continue 2000 units daily She has only mild osteopenia with T score -1.1, to be followed every 2 years   LIPIDS: Needs follow-up levels, no recent labs available, has had high triglycerides before  There are no Patient Instructions on file for this visit.  Total visit time for evaluation and management of multiple problems, download and review of her glucose monitor, counseling = 25 minutes .  Monik Lins 11/02/2016, 3:16 PM   Note: This office note was prepared with Dragon voice recognition system technology. Any transcriptional errors that result from this process are unintentional.

## 2016-11-02 ENCOUNTER — Encounter: Payer: Self-pay | Admitting: Endocrinology

## 2016-11-05 ENCOUNTER — Other Ambulatory Visit: Payer: Self-pay | Admitting: Endocrinology

## 2016-11-05 DIAGNOSIS — F3176 Bipolar disorder, in full remission, most recent episode depressed: Secondary | ICD-10-CM | POA: Diagnosis not present

## 2016-11-05 DIAGNOSIS — F3174 Bipolar disorder, in full remission, most recent episode manic: Secondary | ICD-10-CM | POA: Diagnosis not present

## 2016-11-06 ENCOUNTER — Ambulatory Visit (INDEPENDENT_AMBULATORY_CARE_PROVIDER_SITE_OTHER): Payer: BLUE CROSS/BLUE SHIELD | Admitting: Psychology

## 2016-11-06 DIAGNOSIS — F3131 Bipolar disorder, current episode depressed, mild: Secondary | ICD-10-CM

## 2016-11-11 NOTE — Progress Notes (Signed)
Cardiology Office Note  Date:  11/12/2016   ID:  Miracle, Mongillo 1960/03/26, MRN [id number]  PCP:  Abner Greenspan, MD   Chief Complaint  Patient presents with  . other    12 month follow up. Patient states she is doing well. Meds reviewed verbally with patient.     HPI:  [name] is a very pleasant 56 year old woman with history of   Morbid obesity,  CAD Severe ostial ramus disease treated medically by catheterization August 2016, hypertension,  hyperlipidemia,   previous symptoms of chest pain and shortness of breath.  Migraines Coronary artery disease Chronic tachycardia She presents today for follow-up of her coronary artery disease, hypertension, hyperlipidemia,   In follow-up today she reports that she has drastically changed her diet Lost weight, 15 pounds over the past 2 months Cut off her sweets and soda Still drinks sweet tea first thing in the morning to wake up No regular exercise program.  Previously did water aerobics Chronic shortness of breat,h mild  Brilinta is very expensive for her.   She stopped aspirin on her own Sedentary at baseline  Denies any episodes of tachycardia or palpitations  Lab work reviewed with her HBA1C 6.4 Elevated LFTS   EKG on today's visit shows normal sinus rhythm with rate 70 bpm, new T wave abnormality through the anterior precordial leads, lead III and aVF. T wave abnormality was present during unstable angina symptoms August 2016 and then resolved, had upright T waves  Other past medical history Previously had left arm pain coming on at rest. This seemed to resolve with NSAIDs. Also with several episodes of higher blood pressure, possibly associated with stress. He states that she has been taking clonidine once a day in the evening, amlodipine in the morning  Vertigo symptoms occurred in early 2014 and recurrent August 9. She is taking meclizine. She has dizziness and feels that she is drunk when she stands  up.  History of hyperlipidemia. In June 2010, total cholesterol 314.  Remote smoking for a short period of time, none recently.  PMH:   has a past medical history of ADD (attention deficit disorder) (11/19/2012); Allergic rhinitis, cause unspecified; Anxiety state, unspecified; Arthritis; Asymptomatic postmenopausal status (age-related) (natural); Atypical hyperplasia of left breast (2000); Benign neoplasm of other and unspecified site of the digestive system; Bipolar disorder ([place]); Breast cancer ([place]); CAD (coronary artery disease); Carcinoma of left breast ([place]) (06/02/2014); Chronic diastolic CHF (congestive heart failure) ([place]); Cyst; Depression; Diabetes mellitus ([place]); Dysuria; Edema; Family history of malignant neoplasm of gastrointestinal tract; Fibromyalgia; GERD (gastroesophageal reflux disease); Glaucoma; Headache; Hiatal hernia; History of alcoholism ([place]); History of migraines; History of ovarian cyst; Insomnia, unspecified; Myocardial infarction ([place]) (09-04-14); OCD (obsessive compulsive disorder); Osteopenia (09/25/2016); Other screening mammogram; Pure hypercholesterolemia; Rosacea; Subacute confusional state (11/19/2012); Tubular adenoma of colon (01/10/11); Ulcerative colitis, unspecified; Unspecified asthma(493.90); Unspecified essential hypertension; Unspecified hypothyroidism; Unspecified vitamin D deficiency; and Vertigo.  PSH:    Past Surgical History:  Procedure Laterality Date  . APPENDECTOMY    . AXILLARY SENTINEL NODE BIOPSY Left 05/23/2014   Procedure: AXILLARY SENTINEL NODE BIOPSY;  Surgeon: Christene Lye, MD;  Location: [facility];  Service: General;  Laterality: Left;  . BREAST BIOPSY  Aug 2000   on tamoxifen, atypical hyperplasia  . BREAST SURGERY Left Aug 2000   lumpectomy/ Dr Sharlet Salina  . BREAST SURGERY Bilateral 05/23/14   Mastectomy  . BUNIONECTOMY Right   . CARDIAC CATHETERIZATION N/A 09/05/2014   Procedure: Left Heart  Cath and Coronary Angiography;  Surgeon:  Wellington Hampshire, MD;  Location: [facility];  Service: Cardiovascular;  Laterality: N/A;  . CARDIAC CATHETERIZATION  09-05-14  . CESAREAN SECTION  1996/1997   placenta previa, gest DM, pre-eclampsia  . cholecystect  1999  . CHOLECYSTECTOMY  1997   adhesions also  . COLONOSCOPY  01/2001   Ulcerative colitis  . COLONOSCOPY  04/2004   UC, polyp  . COLONOSCOPY  12/08   UC, no polyps  . DEXA  04/1999 and 2010   normal  . ESOPHAGOGASTRODUODENOSCOPY  09/2001   polyp  . exercise stress test  11/2003   negative  . FEMUR FRACTURE SURGERY Left   . MASTECTOMY Bilateral 05-23-14   Dr. Jamal Collin  . MECKEL DIVERTICULUM EXCISION  1999  . NASAL SINUS SURGERY  05/1997  . nuclear stress test  06/2007   negative  . precancerous mole removed    . SIMPLE MASTECTOMY WITH AXILLARY SENTINEL NODE BIOPSY Bilateral 05/23/2014   Procedure: Bilateral simple mastectomy, left sentinel node biopsy ;  Surgeon: Christene Lye, MD;  Location: [facility];  Service: General;  Laterality: Bilateral;  . Sleep study  11/09   no apnea, but did snore (done by [facility])  . TONSILLECTOMY  1984  . TUBAL LIGATION    . WISDOM TOOTH EXTRACTION  1980's    Current Outpatient Prescriptions  Medication Sig Dispense Refill  . Asenapine Maleate (SAPHRIS) 10 MG SUBL Place 20 mg under the tongue at bedtime.    Marland Kitchen aspirin 81 MG tablet Take 81 mg by mouth daily.    Marland Kitchen atorvastatin (LIPITOR) 80 MG tablet TAKE 1 TABLET(80 MG) BY MOUTH DAILY 30 tablet 3  . b complex vitamins capsule Take 1 capsule by mouth daily.    . chlorproMAZINE (THORAZINE) 25 MG tablet Take 25-50 mg by mouth as needed. Reported on 03/02/2015    . Cholecalciferol (VITAMIN D3) 2000 UNITS TABS Take 1 tablet by mouth daily.    . cloNIDine (CATAPRES) 0.1 MG tablet Take 0.005 mg by mouth 2 (two) times daily.    Marland Kitchen co-enzyme Q-10 50 MG capsule Take 50 mg by mouth daily.    . diazepam (VALIUM) 10 MG tablet Take 2.5-10 mg by mouth as needed for anxiety.    Marland Kitchen diltiazem  (CARDIZEM CD) 240 MG 24 hr capsule Take 1 capsule (240 mg total) by mouth daily. 90 capsule 3  . DimenhyDRINATE (DRAMAMINE PO) Take by mouth as needed.    . fluticasone (FLONASE) 50 MCG/ACT nasal spray USE 2 SPRAYS IN EACH NOSTRIL TWICE DAILY 16 g 5  . gabapentin (NEURONTIN) 300 MG capsule TAKE 1 CAPSULE(300 MG) BY MOUTH THREE TIMES DAILY 90 capsule 11  . Insulin Detemir (LEVEMIR) 100 UNIT/ML Pen Inject 12 Units into the skin daily at 10 pm. (Patient taking differently: Inject 24 Units into the skin daily at 10 pm. ) 15 mL 3  . Insulin Pen Needle 31G X 5 MM MISC Use once a day with Victoza pen 30 each 3  . INVOKANA 300 MG TABS tablet TAKE 1 TABLET(300 MG) BY MOUTH DAILY BEFORE BREAKFAST 30 tablet 5  . lamoTRIgine (LAMICTAL) 200 MG tablet Take 400 mg by mouth daily.    Marland Kitchen latanoprost (XALATAN) 0.005 % ophthalmic solution Place 1 drop into both eyes At bedtime.    Marland Kitchen letrozole (FEMARA) 2.5 MG tablet Take 1 tablet (2.5 mg total) by mouth daily. 30 tablet 6  . Liniments (SALONPAS PAIN RELIEF PATCH EX) Apply  topically as needed. Reported on 03/02/2015    . LINZESS 290 MCG CAPS capsule TAKE 1 CAPSULE(290 MCG) BY MOUTH DAILY 30 capsule 0  . mesalamine (LIALDA) 1.2 g EC tablet Take 1 tablet (1.2 g total) by mouth daily with breakfast. 30 tablet 6  . metFORMIN (GLUCOPHAGE-XR) 500 MG 24 hr tablet TAKE 4 TABLETS BY MOUTH DAILY 120 tablet 3  . Multiple Vitamin (MULTIVITAMIN) tablet Take 1 tablet by mouth daily.      . nebivolol (BYSTOLIC) 10 MG tablet Take 1 tablet (10 mg total) by mouth 3 (three) times daily. Takes 1 tablet in the morning and 2 tablets after lunch 270 tablet 3  . nitrofurantoin (MACRODANTIN) 100 MG capsule Take 100 mg by mouth as directed. Reported on 03/02/2015    . Omega-3 Krill Oil 500 MG CAPS Take 500 mg by mouth.    . ondansetron (ZOFRAN) 4 MG tablet Take 1 tablet (4 mg total) by mouth every 8 (eight) hours as needed for nausea or vomiting. 30 tablet 1  . ONETOUCH VERIO test strip USE AS  DIRECTED TO CHECK BLOOD SUGAR TWICE DAILY 100 each 5  . pantoprazole (PROTONIX) 40 MG tablet TAKE 1 TABLET BY MOUTH TWICE DAILY 60 tablet 3  . Probiotic Product (PROBIOTIC DAILY PO) Take by mouth.    . promethazine (PHENERGAN) 25 MG tablet Take 1 tablet (25 mg total) by mouth every 6 (six) hours as needed for nausea or vomiting. 30 tablet 0  . ranitidine (ZANTAC) 300 MG tablet Take 0.5 tablets (150 mg total) by mouth 2 (two) times daily. 60 tablet 3  . senna-docusate (SENOKOT-S) 8.6-50 MG tablet Take 3 tablets by mouth daily.    . Simethicone (GAS-X PO) Take 1 tablet by mouth 4 (four) times daily.    Marland Kitchen SYNTHROID 137 MCG tablet TAKE 2 TABLETS BY MOUTH DAILY BEFORE BREAKFAST. 60 tablet 3  . ticagrelor (BRILINTA) 90 MG TABS tablet Take 1 tablet (90 mg total) by mouth 2 (two) times daily. 180 tablet 3  . tiZANidine (ZANAFLEX) 4 MG tablet Take 4 mg by mouth. Reported on 03/02/2015    . traZODone (DESYREL) 50 MG tablet Take 50 mg by mouth at bedtime.    . valACYclovir (VALTREX) 500 MG tablet Take 500 mg by mouth 2 (two) times daily.      Marland Kitchen VICTOZA 18 MG/3ML SOPN INJECT 1.8 MG UNDER THE SKIN DAILY AT THE SAME TIME EVERY DAY 9 mL 3  . vitamin C (ASCORBIC ACID) 500 MG tablet Take 250 mg by mouth daily. Takes 2 a day     No current facility-administered medications for this visit.      Allergies:   Ephedrine; Aspartame and phenylalanine; Aspirin; Crestor [rosuvastatin]; Erythromycin; Nsaids; and Oxycodone   Social History:  The patient  reports that she quit smoking about 23 years ago. Her smoking use included Cigarettes. She has a 1.25 pack-year smoking history. She has quit using smokeless tobacco. She reports that she does not drink alcohol or use drugs.   Family History:   family history includes Alzheimer's disease in her father; Breast cancer in her unknown relative; Colitis in her mother; Colon cancer in her father; Coronary artery disease in her father, mother, and unknown relative; Crohn's  disease in her mother; Dementia in her father; Diabetes in her unknown relative; Hypertension in her mother; Osteoporosis in her mother; Stroke in her cousin.    Review of Systems: Review of Systems  Constitutional: Negative.   Respiratory: Positive for shortness of  breath.   Cardiovascular: Negative.   Gastrointestinal: Negative.   Musculoskeletal: Negative.   Neurological: Negative.   Psychiatric/Behavioral: Negative.   All other systems reviewed and are negative.    PHYSICAL EXAM: VS:  BP 122/74 (BP Location: Right Arm, Patient Position: Sitting, Cuff Size: Normal)   Pulse 70   Ht 5\' 4"  (1.626 m)   Wt 209 lb 8 oz (95 kg)   LMP 08/23/2006 (Approximate)   BMI 35.96 kg/m  , BMI Body mass index is 35.96 kg/m. GEN: Well nourished, well developed, in no acute distress,obese  HEENT: normal  Neck: no JVD, carotid bruits, or masses Cardiac: RRR; no murmurs, rubs, or gallops,no edema  Respiratory:  clear to auscultation bilaterally, normal work of breathing GI: soft, nontender, nondistended, + BS MS: no deformity or atrophy  Skin: warm and dry, no rash Neuro:  Strength and sensation are intact Psych: euthymic mood, full affect    Recent Labs: 08/28/2016: Hemoglobin 15.4; Platelets 444 10/25/2016: ALT 45; BUN 8; Creatinine, Ser 0.96; Potassium 4.2; Sodium 140; TSH 0.03    Lipid Panel Lab Results  Component Value Date   CHOL 121 11/10/2015   HDL 36.50 (L) 11/10/2015   LDLCALC 65 10/13/2014   TRIG 217.0 (H) 11/10/2015      Wt Readings from Last 3 Encounters:  11/12/16 209 lb 8 oz (95 kg)  11/01/16 209 lb 2 oz (94.9 kg)  09/18/16 217 lb (98.4 kg)       ASSESSMENT AND PLAN:  Coronary artery disease involving native coronary artery of native heart without angina pectoris - Plan: EKG 12-Lead We will change her to Plavix  75 mg daily, stop her Brilinta given the price over $100 per month New T wave inversions precordial leads, 3 and aVF Reports having shortness of  breath, sedentary at baseline, unable to treadmill We will schedule her for pharmacologic Myoview  Essential hypertension - Plan: EKG 12-Lead Blood pressure is well controlled on today's visit. No changes made to the medications Stable  HYPERCHOLESTEROLEMIA Cholesterol is at goal on the current lipid regimen. No changes to the medications were made. Stable  Type 2 diabetes mellitus with other circulatory complication, unspecified long term insulin use status (HCC) Stressed importance of aggressive weight loss, strict diet Dramatic loss in weight over the past several months, complemented her  Malignant neoplasm of overlapping sites of left female breast, unspecified estrogen receptor status ([place]) Followed by oncology   Total encounter time more than 25 minutes  Greater than 50% was spent in counseling and coordination of care with the patient   Disposition:   F/U  12 months   No orders of the defined types were placed in this encounter.    Signed, Esmond Plants, M.D., Ph.D. 11/12/2016  [place], [place]

## 2016-11-12 ENCOUNTER — Ambulatory Visit (INDEPENDENT_AMBULATORY_CARE_PROVIDER_SITE_OTHER): Payer: BLUE CROSS/BLUE SHIELD | Admitting: Cardiovascular Disease

## 2016-11-12 ENCOUNTER — Encounter: Payer: Self-pay | Admitting: Cardiovascular Disease

## 2016-11-12 ENCOUNTER — Encounter: Payer: Self-pay | Admitting: Endocrinology

## 2016-11-12 VITALS — BP 122/74 | HR 70 | Ht 64.0 in | Wt 209.5 lb

## 2016-11-12 DIAGNOSIS — Z170 Estrogen receptor positive status [ER+]: Secondary | ICD-10-CM | POA: Diagnosis not present

## 2016-11-12 DIAGNOSIS — C50812 Malignant neoplasm of overlapping sites of left female breast: Secondary | ICD-10-CM | POA: Diagnosis not present

## 2016-11-12 DIAGNOSIS — R9431 Abnormal electrocardiogram [ECG] [EKG]: Secondary | ICD-10-CM

## 2016-11-12 DIAGNOSIS — R000 Tachycardia, unspecified: Secondary | ICD-10-CM

## 2016-11-12 DIAGNOSIS — E1159 Type 2 diabetes mellitus with other circulatory complications: Secondary | ICD-10-CM

## 2016-11-12 DIAGNOSIS — K51919 Ulcerative colitis, unspecified with unspecified complications: Secondary | ICD-10-CM

## 2016-11-12 DIAGNOSIS — I10 Essential (primary) hypertension: Secondary | ICD-10-CM

## 2016-11-12 DIAGNOSIS — I25118 Atherosclerotic heart disease of native coronary artery with other forms of angina pectoris: Secondary | ICD-10-CM | POA: Diagnosis not present

## 2016-11-12 DIAGNOSIS — E7800 Pure hypercholesterolemia, unspecified: Secondary | ICD-10-CM

## 2016-11-12 MED ORDER — CLOPIDOGREL BISULFATE 75 MG PO TABS: 75.0000 mg | ORAL_TABLET | Freq: Every day | ORAL | 3 refills | Status: DC

## 2016-11-12 NOTE — Patient Instructions (Addendum)
Medication Instructions:   Ask Dr. Dwyane Dee about invokana vs Paulene Floor to change brilinta to plavix one a day  Labwork:  No new labs needed  Testing/Procedures:  We will order a lexiscan myoview for CAD, New abn EKG  [place]  Your caregiver has ordered a Stress Test with nuclear imaging. The purpose of this test is to evaluate the blood supply to your heart muscle. This procedure is referred to as a "Non-Invasive Stress Test." This is because other than having an IV started in your vein, nothing is inserted or "invades" your body. Cardiac stress tests are done to find areas of poor blood flow to the heart by determining the extent of coronary artery disease (CAD). Some patients exercise on a treadmill, which naturally increases the blood flow to your heart, while others who are  unable to walk on a treadmill due to physical limitations have a pharmacologic/chemical stress agent called Lexiscan . This medicine will mimic walking on a treadmill by temporarily increasing your coronary blood flow.   Please note: these test may take anywhere between 2-4 hours to complete  PLEASE REPORT TO [facility] AT THE FIRST DESK WILL DIRECT YOU WHERE TO GO  Date of Procedure:___Thursday, November 1______  Arrival Time for Procedure:____7:15 am__________  Instructions regarding medication:   __X__ : Hold diabetes medication morning of procedure  __X__:  Hold BYSTOLIC the night before and morning of procedure   How to prepare for your Myoview test:  1. Do not eat or drink after midnight 2. No caffeine for 24 hours prior to test 3. No smoking 24 hours prior to test. 4. Your medication may be taken with water.  If your doctor stopped a medication because of this test, do not take that medication. 5. Ladies, please do not wear dresses.  Skirts or pants are appropriate. Please wear a short sleeve shirt. 6. No perfume, cologne or lotion. 7. Wear comfortable  walking shoes. No heels!  Follow-Up: It was a pleasure seeing you in the office today. Please call us if you have new issues that need to be addressed before your next appt.  [phone]  Your physician wants you to follow-up in: 12 months.  You will receive a reminder letter in the mail two months in advance. If you don't receive a letter, please call our office to schedule the follow-up appointment.  If you need a refill on your cardiac medications before your next appointment, please call your pharmacy.    Cardiac Nuclear Scan A cardiac nuclear scan is a test that measures blood flow to the heart when a person is resting and when he or she is exercising. The test looks for problems such as:  Not enough blood reaching a portion of the heart.  The heart muscle not working normally.  You may need this test if:  You have heart disease.  You have had abnormal lab results.  You have had heart surgery or angioplasty.  You have chest pain.  You have shortness of breath.  In this test, a radioactive dye (tracer) is injected into your bloodstream. After the tracer has traveled to your heart, an imaging device is used to measure how much of the tracer is absorbed by or distributed to various areas of your heart. This procedure is usually done at a hospital and takes 2-4 hours. Tell a health care provider about:  Any allergies you have.  All medicines you are taking, including vitamins, herbs,  eye drops, creams, and over-the-counter medicines.  Any problems you or family members have had with the use of anesthetic medicines.  Any blood disorders you have.  Any surgeries you have had.  Any medical conditions you have.  Whether you are pregnant or may be pregnant. What are the risks? Generally, this is a safe procedure. However, problems may occur, including:  Serious chest pain and heart attack. This is only a risk if the stress portion of the test is done.  Rapid  heartbeat.  Sensation of warmth in your chest. This usually passes quickly.  What happens before the procedure?  Ask your health care provider about changing or stopping your regular medicines. This is especially important if you are taking diabetes medicines or blood thinners.  Remove your jewelry on the day of the procedure. What happens during the procedure?  An IV tube will be inserted into one of your veins.  Your health care provider will inject a small amount of radioactive tracer through the tube.  You will wait for 20-40 minutes while the tracer travels through your bloodstream.  Your heart activity will be monitored with an electrocardiogram (ECG).  You will lie down on an exam table.  Images of your heart will be taken for about 15-20 minutes.  You may be asked to exercise on a treadmill or stationary bike. While you exercise, your heart's activity will be monitored with an ECG, and your blood pressure will be checked. If you are unable to exercise, you may be given a medicine to increase blood flow to parts of your heart.  When blood flow to your heart has peaked, a tracer will again be injected through the IV tube.  After 20-40 minutes, you will get back on the exam table and have more images taken of your heart.  When the procedure is over, your IV tube will be removed. The procedure may vary among health care providers and hospitals. Depending on the type of tracer used, scans may need to be repeated 3-4 hours later. What happens after the procedure?  Unless your health care provider tells you otherwise, you may return to your normal schedule, including diet, activities, and medicines.  Unless your health care provider tells you otherwise, you may increase your fluid intake. This will help flush the contrast dye from your body. Drink enough fluid to keep your urine clear or pale yellow.  It is up to you to get your test results. Ask your health care provider, or  the department that is doing the test, when your results will be ready. Summary  A cardiac nuclear scan measures the blood flow to the heart when a person is resting and when he or she is exercising.  You may need this test if you are at risk for heart disease.  Tell your health care provider if you are pregnant.  Unless your health care provider tells you otherwise, increase your fluid intake. This will help flush the contrast dye from your body. Drink enough fluid to keep your urine clear or pale yellow. This information is not intended to replace advice given to you by your health care provider. Make sure you discuss any questions you have with your health care provider. Document Released: 01/26/2004 Document Revised: 01/03/2016 Document Reviewed: 12/09/2012 Elsevier Interactive Patient Education  2017 [employer].

## 2016-11-14 ENCOUNTER — Other Ambulatory Visit: Payer: Self-pay

## 2016-11-14 ENCOUNTER — Encounter
Admission: RE | Admit: 2016-11-14 | Discharge: 2016-11-14 | Disposition: A | Discharge status: Home / Self Care | Payer: BLUE CROSS/BLUE SHIELD | Source: Ambulatory Visit | Attending: Cardiovascular Disease | Admitting: Cardiovascular Disease

## 2016-11-14 DIAGNOSIS — C50812 Malignant neoplasm of overlapping sites of left female breast: Secondary | ICD-10-CM | POA: Diagnosis not present

## 2016-11-14 DIAGNOSIS — K51919 Ulcerative colitis, unspecified with unspecified complications: Secondary | ICD-10-CM | POA: Insufficient documentation

## 2016-11-14 DIAGNOSIS — R000 Tachycardia, unspecified: Secondary | ICD-10-CM | POA: Diagnosis not present

## 2016-11-14 DIAGNOSIS — I25118 Atherosclerotic heart disease of native coronary artery with other forms of angina pectoris: Secondary | ICD-10-CM | POA: Insufficient documentation

## 2016-11-14 DIAGNOSIS — E1159 Type 2 diabetes mellitus with other circulatory complications: Secondary | ICD-10-CM | POA: Diagnosis not present

## 2016-11-14 DIAGNOSIS — I10 Essential (primary) hypertension: Secondary | ICD-10-CM | POA: Diagnosis not present

## 2016-11-14 DIAGNOSIS — Z170 Estrogen receptor positive status [ER+]: Secondary | ICD-10-CM | POA: Diagnosis not present

## 2016-11-14 DIAGNOSIS — R9431 Abnormal electrocardiogram [ECG] [EKG]: Secondary | ICD-10-CM | POA: Insufficient documentation

## 2016-11-14 DIAGNOSIS — E7800 Pure hypercholesterolemia, unspecified: Secondary | ICD-10-CM | POA: Diagnosis not present

## 2016-11-14 LAB — NM MYOCAR MULTI W/SPECT W/WALL MOTION / EF
Estimated workload: 1 METS
LV dias vol: 68 mL (ref 46–106)
LV sys vol: 21 mL
Peak HR: 76 {beats}/min
Percent HR: 50 %
Percent of predicted max HR: 46 %
Rest HR: 62 {beats}/min
SDS: 4
SRS: 14
SSS: 17
Stage 1 Grade: 0 %
Stage 1 HR: 60 {beats}/min
Stage 1 Speed: 0 mph
Stage 2 Grade: 0 %
Stage 2 HR: 60 {beats}/min
Stage 2 Speed: 0 mph
Stage 3 Grade: 0 %
Stage 3 HR: 76 {beats}/min
Stage 3 Speed: 0 mph
Stage 4 Grade: 0 %
Stage 4 HR: 81 {beats}/min
Stage 4 Speed: 0 mph
Stage 5 DBP: 72 mmHg
Stage 5 Grade: 0 %
Stage 5 HR: 78 {beats}/min
Stage 5 SBP: 126 mmHg
Stage 5 Speed: 0 mph
TID: 1.08

## 2016-11-14 MED ORDER — TECHNETIUM TC 99M TETROFOSMIN IV KIT
32.5230 | PACK | Freq: Once | INTRAVENOUS | Status: AC | PRN
  Administered 2016-11-14: 32.523 via INTRAVENOUS

## 2016-11-14 MED ORDER — REGADENOSON 0.4 MG/5ML IV SOLN
0.4000 mg | Freq: Once | INTRAVENOUS | Status: AC
  Administered 2016-11-14: 0.4 mg via INTRAVENOUS

## 2016-11-14 MED ORDER — INSULIN DETEMIR 100 UNIT/ML FLEXPEN: PEN_INJECTOR | SUBCUTANEOUS | 3 refills | Status: DC

## 2016-11-14 MED ORDER — TECHNETIUM TC 99M TETROFOSMIN IV KIT
13.2280 | PACK | Freq: Once | INTRAVENOUS | Status: AC | PRN
  Administered 2016-11-14: 13.228 via INTRAVENOUS

## 2016-11-14 NOTE — Addendum Note (Signed)
Addended by: Janan Ridge on: 11/14/2016 11:34 AM   Modules accepted: Orders

## 2016-11-19 ENCOUNTER — Other Ambulatory Visit: Payer: Self-pay | Admitting: Endocrinology

## 2016-12-03 ENCOUNTER — Encounter: Payer: Self-pay | Admitting: Primary Care

## 2016-12-03 ENCOUNTER — Ambulatory Visit: Payer: BLUE CROSS/BLUE SHIELD | Admitting: Primary Care

## 2016-12-03 VITALS — BP 124/76 | HR 80 | Temp 98.2°F | Ht 64.0 in | Wt 211.4 lb

## 2016-12-03 DIAGNOSIS — M545 Low back pain, unspecified: Secondary | ICD-10-CM

## 2016-12-03 DIAGNOSIS — H25811 Combined forms of age-related cataract, right eye: Secondary | ICD-10-CM | POA: Diagnosis not present

## 2016-12-03 DIAGNOSIS — H2511 Age-related nuclear cataract, right eye: Secondary | ICD-10-CM | POA: Diagnosis not present

## 2016-12-03 NOTE — Patient Instructions (Signed)
Continue gabapentin as prescribed.  Increase Tylenol dose and do not exceed 3000 mg in 24 hours.  Apply a heating pad to the back three times daily for 30 minute intervals.   Make sure you are getting up regularly to stretch.   You can apply topical arthritis cream to the area to help with discomfort.  If no improvement by early next week then we can send you to physical therapy.  It was a pleasure meeting you!

## 2016-12-03 NOTE — Progress Notes (Signed)
Subjective:    Patient ID: Lindsay Fry, female    DOB: 08/28/1960, 56 y.o.   MRN: [id number]  HPI  Lindsay Fry is a 56 year old female with a history of fibromyalgia, CAD with NSTEMI, low back pain, ulcerative colitis, breast cancer, bipolar disorder, and numerous other medical problems who presents today with a chief complaint of back pain.   Her pain is located to the mid lower back with radiation to left side and left hip. She has chronic back pain but noticed increased pain starting three days ago. Since then her pain has progressed. She denies numbness/tinginling, trauma/injury, urinary frequency, dysuria. She describes her pain like a "deep ache" that is worse with movement, especially when rising after sitting for prolonged periods of time. She had a hard time getting out of a restaurant booth when finished eating.   She's taken gabapentin and tylenol as prescribed without improvement. She endorses that she goes to the pool twice weekly and walks about one mile. She's not tried any other intervention.   Review of Systems  Genitourinary: Negative for dysuria, frequency and hematuria.  Musculoskeletal: Positive for arthralgias and back pain.  Skin: Negative for color change.  Neurological: Negative for weakness and numbness.       Past Medical History:  Diagnosis Date  . ADD (attention deficit disorder) 11/19/2012  . Allergic rhinitis, cause unspecified   . Anxiety state, unspecified   . Arthritis   . Asymptomatic postmenopausal status (age-related) (natural)   . Atypical hyperplasia of left breast 2000  . Benign neoplasm of other and unspecified site of the digestive system   . Bipolar disorder ([place])   . Breast cancer ([place])    Left Breast 30m invasive CA left uiq, dcis left uoq and a lot ADH, ALH in both breasts.  .Marland KitchenCAD (coronary artery disease)    a. 08/2014 NSTEMI/Cath: LM nl, LAD 50p, D1/D2 min irregs, LCX nl, OM1 40, LPDA nl, RI 99ost (2.067mvessel->med Rx), RCA nl, AM  60, nl EF.  . Carcinoma of left breast ([place]/19/2016  . Chronic diastolic CHF (congestive heart failure) ([facility] [place]   a. 08/2014 Echo: EF 60-65%, Gr 1 DD, nl LA.  . Marland Kitchenyst    on Achilles tendon  . Depression   . Diabetes mellitus ([facility]   frank  . Dysuria   . Edema   . Family history of malignant neoplasm of gastrointestinal tract   . Fibromyalgia   . GERD (gastroesophageal reflux disease)   . Glaucoma    pre glaucoma  . Headache    migraines  . Hiatal hernia   . History of alcoholism ([facility] [place]  . History of migraines   . History of ovarian cyst   . Insomnia, unspecified   . Myocardial infarction ([facility]8[place]16  . OCD (obsessive compulsive disorder)   . Osteopenia 09/25/2016   Femoral neck T -1.1  9/18  . Other screening mammogram   . Pure hypercholesterolemia   . Rosacea   . Subacute confusional state 11/19/2012  . Tubular adenoma of colon 01/10/11  . Ulcerative colitis, unspecified   . Unspecified asthma(493.90)   . Unspecified essential hypertension   . Unspecified hypothyroidism   . Unspecified vitamin D deficiency   . Vertigo      Social History   Socioeconomic History  . Marital status: Married    Spouse name: Lindsay Fry  . Number of children: 2  . Years of education: college  . Highest education level: Not  on file  Social Needs  . Financial resource strain: Not on file  . Food insecurity - worry: Not on file  . Food insecurity - inability: Not on file  . Transportation needs - medical: Not on file  . Transportation needs - non-medical: Not on file  Occupational History  . Occupation: unemployed  Tobacco Use  . Smoking status: Former Smoker    Packs/day: 0.25    Years: 5.00    Pack years: 1.25    Types: Cigarettes    Last attempt to quit: 01/14/1993    Years since quitting: 23.9  . Smokeless tobacco: Former Network engineer and Sexual Activity  . Alcohol use: No    Alcohol/week: 0.0 oz    Comment: Recovered ETOH  . Drug use: No  . Sexual activity: Not Currently     Partners: Male  Other Topics Concern  . 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 APPENDECTOMY    . AXILLARY SENTINEL NODE BIOPSY Left 05/23/2014   Procedure: AXILLARY SENTINEL NODE BIOPSY;  Surgeon: Lindsay Fry;  Location: [facility] ORS;  Service: General;  Laterality: Left;  . BREAST BIOPSY  Aug 2000   on tamoxifen, atypical hyperplasia  . BREAST SURGERY Left Aug 2000   lumpectomy/ Lindsay Fry  . BREAST SURGERY Bilateral 05/23/14   Mastectomy  . BUNIONECTOMY Right   . CARDIAC CATHETERIZATION N/A 09/05/2014   Procedure: Left Heart Cath and Coronary Angiography;  Surgeon: Lindsay Hampshire, Fry;  Location: [place] [facility];  Service: Cardiovascular;  Laterality: N/A;  . CARDIAC CATHETERIZATION  09-05-14  . CESAREAN SECTION  1996/1997   placenta previa, gest DM, pre-eclampsia  . cholecystect  1999  . CHOLECYSTECTOMY  1997   adhesions also  . COLONOSCOPY  01/2001   Ulcerative colitis  . COLONOSCOPY  04/2004   UC, polyp  . COLONOSCOPY  12/08   UC, no polyps  . DEXA  04/1999 and 2010   normal  . ESOPHAGOGASTRODUODENOSCOPY  09/2001   polyp  . exercise stress test  11/2003   negative  . FEMUR FRACTURE SURGERY Left   . MASTECTOMY Bilateral 05-23-14   Lindsay Fry  . MECKEL DIVERTICULUM EXCISION  1999  . NASAL SINUS SURGERY  05/1997  . nuclear stress test  06/2007   negative  . precancerous mole removed    . SIMPLE MASTECTOMY WITH AXILLARY SENTINEL NODE BIOPSY Bilateral 05/23/2014   Procedure: Bilateral simple mastectomy, left sentinel node biopsy ;  Surgeon: Lindsay Fry;  Location: [facility] ORS;  Service: General;  Laterality: Bilateral;  . Sleep study  11/09   no apnea, but did snore (done by [facility])  . TONSILLECTOMY  1984  . TUBAL LIGATION    . WISDOM TOOTH EXTRACTION   1980's    Family History  Problem Relation Age of Onset  . Coronary artery disease Father   . Colon cancer Father   . Alzheimer's disease Father   . Dementia Father   . Coronary artery disease Mother   . Hypertension Mother   . Osteoporosis Mother   . Colitis Mother   . Crohn's disease Mother   . Breast cancer Unknown  great aunts  . Coronary artery disease Unknown        Uncle (also AAA)  . Diabetes Unknown        remote family history  . Stroke Cousin   . Esophageal cancer Neg Hx   . Rectal cancer Neg Hx   . Stomach cancer Neg Hx     Allergies  Allergen Reactions  . Ephedrine Other (See Comments)    Pt becomes hyper  . Aspartame And Phenylalanine Nausea Only  . Aspirin     REACTION: aggrivates colitis  . Crestor [Rosuvastatin]     REACTION: increased lfts  . Erythromycin     REACTION: GI upset  . Nsaids     REACTION: aggrivate colitis  . Oxycodone Other (See Comments)    Panic Attack    Current Outpatient Medications on File Prior to Visit  Medication Sig Dispense Refill  . amantadine (SYMMETREL) 100 MG capsule Take 100 mg by mouth 2 (two) times daily.    . Asenapine Maleate (SAPHRIS) 10 MG SUBL Place 10 mg under the tongue at bedtime.     Marland Kitchen atorvastatin (LIPITOR) 80 MG tablet TAKE 1 TABLET(80 MG) BY MOUTH DAILY 30 tablet 1  . b complex vitamins capsule Take 1 capsule by mouth daily.    . B-D UF III MINI PEN NEEDLES 31G X 5 MM MISC USE FOR INJECTIONS TWICE DAILY 100 each 0  . BAYER MICROLET LANCETS lancets USE AS DIRECTED TO CHECK BLOOD SUGAR TWICE DAILY 200 each 2  . Blood Glucose Monitoring Suppl (CONTOUR NEXT EZ MONITOR) w/Device KIT USE AS DIRECTED TO CHECK BLOOD SUGAR TWICE DAILY 1 kit 2  . BYSTOLIC 10 MG tablet TAKE 1 TABLET BY MOUTH IN THE MORNING AND 2 TABLETS AFTER LUNCH 270 tablet 3  . CARTIA XT 240 MG 24 hr capsule TAKE 1 CAPSULE(240 MG) BY MOUTH DAILY 90 capsule 3  . chlorproMAZINE (THORAZINE) 25 MG tablet Take 25-50 mg by mouth as needed.  Reported on 03/02/2015    . Cholecalciferol (VITAMIN D3) 2000 UNITS TABS Take 1 tablet by mouth daily.    . cloNIDine (CATAPRES) 0.1 MG tablet Take 1 tablet (0.1 mg total) by mouth 2 (two) times daily. 180 tablet 3  . clopidogrel (PLAVIX) 75 MG tablet Take 1 tablet (75 mg total) by mouth daily. 90 tablet 3  . co-enzyme Q-10 50 MG capsule Take 50 mg by mouth daily.    . diazepam (VALIUM) 10 MG tablet Take 2.5-10 mg by mouth as needed for anxiety.    . diphenoxylate-atropine (LOMOTIL) 2.5-0.025 MG tablet Take 1-2 tablets by mouth 3 times a day as needed for diarrhea 90 tablet 0  . gabapentin (NEURONTIN) 300 MG capsule TAKE 1 CAPSULE(300 MG) BY MOUTH THREE TIMES DAILY 90 capsule 11  . glucose blood (BAYER CONTOUR NEXT TEST) test strip USE AS DIRECTED TO CHECK BLOOD SUGAR TWICE DAILY 200 each 2  . Insulin Detemir (LEVEMIR FLEXTOUCH) 100 UNIT/ML Pen INJECT 40 UNITS UNDER THE SKIN DAILY AT 10 PM 15 mL 3  . Insulin Detemir (LEVEMIR) 100 UNIT/ML Pen Inject 32 Units into the skin daily at 10 pm. (Patient taking differently: Inject 35 Units into the skin daily at 10 pm. Uses 35 to 36 units) 15 mL 2  . INVOKANA 300 MG TABS tablet TAKE 1 TABLET(300 MG) BY MOUTH DAILY BEFORE BREAKFAST 30 tablet 0  . lamoTRIgine (LAMICTAL) 200 MG tablet Take 400 mg by mouth daily.    Marland Kitchen latanoprost (XALATAN) 0.005 % ophthalmic  solution Place 1 drop into both eyes At bedtime.    Marland Kitchen letrozole (FEMARA) 2.5 MG tablet Take 1 tablet (2.5 mg total) by mouth daily. 30 tablet 12  . levothyroxine (SYNTHROID) 112 MCG tablet Take 2 tablets (224 mcg total) by mouth daily before breakfast. 60 tablet 3  . linaclotide (LINZESS) 145 MCG CAPS capsule Take 1 capsule (145 mcg total) by mouth daily before breakfast. 30 capsule 3  . lithium carbonate (ESKALITH) 450 MG CR tablet Take 900 mg by mouth at bedtime.    Marland Kitchen lurasidone (LATUDA) 40 MG TABS tablet Take 80 mg by mouth daily with breakfast.     . Melatonin 10 MG CAPS Take 1 capsule by mouth at  bedtime.    . mesalamine (LIALDA) 1.2 g EC tablet Take 2 tablets (2.4 g total) by mouth daily. 60 tablet 11  . Multiple Vitamin (MULTIVITAMIN) tablet Take 1 tablet by mouth daily.      . nitrofurantoin (MACRODANTIN) 100 MG capsule Take 100 mg by mouth as directed. Reported on 03/02/2015    . Omega-3 Krill Oil 500 MG CAPS Take 500 mg by mouth.    . ondansetron (ZOFRAN ODT) 4 MG disintegrating tablet Take 1 each morning then every 6 hours for nausea. 120 tablet 2  . OZEMPIC 0.25 or 0.5 MG/DOSE SOPN INJECT 0.5 MG UNDER THE SKIN ONCE A WEEK 1 pen 1  . pantoprazole (PROTONIX) 40 MG tablet TAKE 1 TABLET BY MOUTH TWICE DAILY 60 tablet 3  . Probiotic Product (PROBIOTIC DAILY PO) Take by mouth.    . ranitidine (ZANTAC) 300 MG tablet Take 0.5 tablets (150 mg total) by mouth 2 (two) times daily. 60 tablet 3  . Simethicone (GAS-X PO) Take 1 tablet by mouth 4 (four) times daily.    . traZODone (DESYREL) 50 MG tablet Take 150 mg by mouth at bedtime.     . Triamcinolone Acetonide (NASACORT ALLERGY 24HR NA) Place 2 sprays into the nose daily.    . valACYclovir (VALTREX) 500 MG tablet Take 1 tablet (500 mg total) by mouth 2 (two) times daily. 60 tablet 11  . vitamin C (ASCORBIC ACID) 500 MG tablet Take 250 mg by mouth daily. Takes 2 a day     Current Facility-Administered Medications on File Prior to Visit  Medication Dose Route Frequency Provider Last Rate Last Dose  . 0.9 %  sodium chloride infusion  500 mL Intravenous Continuous Irene Shipper, Fry        BP 124/76   Pulse 80   Temp 98.2 F (36.8 C) (Oral)   Ht _0  (1.626 m)   Wt 211 lb 6.4 oz (95.9 kg)   LMP 08/23/2006 (Approximate) Comment: tubal ligation  SpO2 97%   BMI 36.29 kg/m    Objective:   Physical Exam  Constitutional: She appears well-nourished.  Neck: Neck supple.  Cardiovascular: Normal rate and regular rhythm.  Pulmonary/Chest: Effort normal and breath sounds normal.  Abdominal: There is no CVA tenderness.  Musculoskeletal:        Lumbar back: She exhibits decreased range of motion and pain. She exhibits no tenderness.       Back:  Negative straight leg raise. 5/5 strength to bilateral lower extremities.   Neurological:  Reflex Scores:      Patellar reflexes are 2+ on the right side and 2+ on the left side. Skin: Skin is warm and dry.          Assessment & Plan:  Arthritis:  Lower back  pain with radiation to left lower back and hip, mostly with movement after sitting for prolonged periods of time. HPI and exam highly suggestive of osteoarthritic flare, will start with conservative treatment given her complex medication regimen. Increase tylenol to 1000 mg three times daily as needed (no NSAIDs given UC), apply a heating pad three times daily, discussed OTC topical agents for arthritis, discussed importance of stretching and to refrain from stagnation.  If no improvement then consider PT referral.  Sheral Flow, NP

## 2016-12-12 ENCOUNTER — Other Ambulatory Visit: Payer: Self-pay | Admitting: Cardiovascular Disease

## 2016-12-12 NOTE — Telephone Encounter (Signed)
Please review the Clonidine dose and refill. Thanks!

## 2016-12-19 ENCOUNTER — Ambulatory Visit (INDEPENDENT_AMBULATORY_CARE_PROVIDER_SITE_OTHER): Payer: BLUE CROSS/BLUE SHIELD | Admitting: Psychology

## 2016-12-19 DIAGNOSIS — F3131 Bipolar disorder, current episode depressed, mild: Secondary | ICD-10-CM | POA: Diagnosis not present

## 2016-12-20 ENCOUNTER — Ambulatory Visit: Payer: Self-pay | Admitting: Family Medicine

## 2016-12-21 ENCOUNTER — Other Ambulatory Visit: Payer: Self-pay | Admitting: Cardiovascular Disease

## 2016-12-22 ENCOUNTER — Other Ambulatory Visit: Payer: Self-pay | Admitting: Endocrinology

## 2016-12-26 ENCOUNTER — Ambulatory Visit: Payer: Self-pay

## 2016-12-26 ENCOUNTER — Encounter: Payer: Self-pay | Admitting: Family Medicine

## 2016-12-26 ENCOUNTER — Emergency Department
Admission: EM | Admit: 2016-12-26 | Discharge: 2016-12-26 | Disposition: A | Discharge status: Home / Self Care | Payer: BLUE CROSS/BLUE SHIELD | Attending: Emergency Medicine | Admitting: Emergency Medicine

## 2016-12-26 ENCOUNTER — Other Ambulatory Visit: Payer: Self-pay

## 2016-12-26 DIAGNOSIS — Z5321 Procedure and treatment not carried out due to patient leaving prior to being seen by health care provider: Secondary | ICD-10-CM | POA: Insufficient documentation

## 2016-12-26 DIAGNOSIS — R531 Weakness: Secondary | ICD-10-CM | POA: Diagnosis not present

## 2016-12-26 LAB — URINALYSIS, COMPLETE (UACMP) WITH MICROSCOPIC
Bacteria, UA: NONE SEEN
Bilirubin Urine: NEGATIVE
Glucose, UA: >=500 mg/dL — AB
Hgb urine dipstick: NEGATIVE
Ketones, ur: NEGATIVE mg/dL
Leukocytes, UA: NEGATIVE
Nitrite: NEGATIVE
Protein, ur: 30 mg/dL — AB
Specific Gravity, Urine: 1.024 (ref 1.005–1.030)
pH: 5 (ref 5.0–8.0)

## 2016-12-26 LAB — BASIC METABOLIC PANEL
Anion gap: 11 (ref 5–15)
BUN: 14 mg/dL (ref 6–20)
CO2: 23 mmol/L (ref 22–32)
Calcium: 10.1 mg/dL (ref 8.9–10.3)
Chloride: 102 mmol/L (ref 101–111)
Creatinine, Ser: 0.88 mg/dL (ref 0.44–1.00)
GFR calc Af Amer: >60 mL/min (ref >60)
GFR calc non Af Amer: >60 mL/min (ref >60)
Glucose, Bld: 140 mg/dL — ABNORMAL HIGH (ref 65–99)
Potassium: 3.7 mmol/L (ref 3.5–5.1)
Sodium: 136 mmol/L (ref 135–145)

## 2016-12-26 LAB — CBC
HCT: 51.2 % — ABNORMAL HIGH (ref 35.0–47.0)
Hemoglobin: 17.1 g/dL — ABNORMAL HIGH (ref 12.0–16.0)
MCH: 30.2 pg (ref 26.0–34.0)
MCHC: 33.3 g/dL (ref 32.0–36.0)
MCV: 90.6 fL (ref 80.0–100.0)
Platelets: 375 10*3/uL (ref 150–440)
RBC: 5.66 MIL/uL — ABNORMAL HIGH (ref 3.80–5.20)
RDW: 14.3 % (ref 11.5–14.5)
WBC: 14.2 10*3/uL — ABNORMAL HIGH (ref 3.6–11.0)

## 2016-12-26 NOTE — Telephone Encounter (Signed)
Agree with ED advisement -I will watch for records

## 2016-12-26 NOTE — ED Triage Notes (Signed)
Pt and husbands states yesterday after eating at [employer] the pt slumped over and began having generalized weakness with garbled speech and difficulty with her thoughts but 1pm the pt states she was feel more herself. States today back at baseline but is just feeling tired. States they called there PCP and was referred to the ED. Denies any visual or neuro changes today.

## 2016-12-26 NOTE — ED Notes (Signed)
Explained the wait to family

## 2016-12-26 NOTE — Telephone Encounter (Signed)
  Reason for Disposition . [1] Loss of speech or garbled speech AND [2] sudden onset AND [3] brief (now gone)  Answer Assessment - Initial Assessment Questions 1. SYMPTOM: "What is the main symptom you are concerned about?" (e.g., weakness, numbness)     Feels weak and washed out 2. ONSET: "When did this start?" (minutes, hours, days; while sleeping)     Yesterday was disoriented and doesn't remember much of yesterday 3. LAST NORMAL: "When was the last time you were normal (no symptoms)?"     Feels normal today 4. PATTERN "Does this come and go, or has it been constant since it started?"  "Is it present now?"     Just feels weak today 5. CARDIAC SYMPTOMS: "Have you had any of the following symptoms: chest pain, difficulty breathing, palpitations?"     No 6. NEUROLOGIC SYMPTOMS: "Have you had any of the following symptoms: headache, dizziness, vision loss, double vision, changes in speech, unsteady on your feet?"     Slurred speech. 7. OTHER SYMPTOMS: "Do you have any other symptoms?"     Couldn't concentrate yesterday. Stumbled into walls yesterday. 8. PREGNANCY: "Is there any chance you are pregnant?" "When was your last menstrual period?"     No  Protocols used: NEUROLOGIC DEFICIT-A-AH Husband and pt. Report pt. Had an episode yesterday where she had some slurred speech and difficulty walking yesterday. This has resolved, but she has a headache today and is fatigued. Instructed to go to ED now.

## 2016-12-26 NOTE — ED Triage Notes (Signed)
First Nurse Note: Pt presents to ED via [place] with her husband. Pt c/o weakness and slurred speech that started yesterday, pt denies any slurred speech today, c/o generalized weakness today.

## 2016-12-27 ENCOUNTER — Ambulatory Visit: Payer: Self-pay

## 2016-12-27 NOTE — Telephone Encounter (Signed)
Pt. called to report intermittent episodes of having slurred speech, and staggering gait. Reported the episode started on Wednesday, 12/12, when she woke up; stated she was dizzy, speech slurred, staggering, and rode to [place] with husband. Then, when she returned home, she felt very sleepy and sluggish and couldn't walk into her house, so her husband wheeled her on an office chair to her room, and she fell asleep.  Was advised by NT to go to ER yesterday, when reported previous symptoms.  Stated she went to the ER, and waited 5 hrs. and then left.  Reported she felt better yesterday evening. Today, had similar episode with waking up and having slurred speech, and staggering gait, but felt completely normal by about 3:30 PM today.  Questioned if she had checked BS during the episodes; denied.  Reported her fasting blood sugar today was 128. The pt. stated "I really think my Trazadone is causing these episodes."  Reported she is going to call Dr. Starleen Arms office about the Trazadone. Pt. was advised to call and make an appt. with Dr. Glori Bickers; no appt. avail. next week with PCP.  Sched. Appt. with NP on 12/19. Pt. advised to have husband call 911 with recurrent episode of decreased alertness, staggering gait/ inability to walk, slurred speech, or other Neurological changes, and to check BS during episode.  Verb. Understanding.                Answer Assessment - Initial Assessment Questions 1. SYMPTOM: "What is the main symptom you are concerned about?" (e.g., weakness, numbness)     Speech slurred, staggering a little bit. ; as the day goes on it gets better  2. ONSET: "When did this start?" (minutes, hours, days; while sleeping)     2 days ago 3. LAST NORMAL: "When was the last time you were normal (no symptoms)?"     Felt normal yesterday 4. PATTERN "Does this come and go, or has it been constant since it started?"  "Is it present now?"     Symptoms coming and going 5. CARDIAC SYMPTOMS: "Have you had any  of the following symptoms: chest pain, difficulty breathing, palpitations?"     Denies chest pain, difficulty breathing, or heart palpitations 6. NEUROLOGIC SYMPTOMS: "Have you had any of the following symptoms: headache, dizziness, vision loss, double vision, changes in speech, unsteady on your feet?"    Denies headache, loss of vision, dizziness; c/o unsteadiness on feet, and slurring speech. 7. OTHER SYMPTOMS: "Do you have any other symptoms?"     none 8. PREGNANCY: "Is there any chance you are pregnant?" "When was your last menstrual period?"     no  Protocols used: NEUROLOGIC DEFICIT-A-AH

## 2016-12-28 ENCOUNTER — Other Ambulatory Visit: Payer: Self-pay | Admitting: Internal Medicine

## 2016-12-30 ENCOUNTER — Ambulatory Visit: Payer: Self-pay | Admitting: Family Medicine

## 2016-12-30 DIAGNOSIS — H2512 Age-related nuclear cataract, left eye: Secondary | ICD-10-CM | POA: Diagnosis not present

## 2016-12-30 DIAGNOSIS — H25812 Combined forms of age-related cataract, left eye: Secondary | ICD-10-CM | POA: Diagnosis not present

## 2017-01-01 ENCOUNTER — Encounter: Payer: Self-pay | Admitting: Family Medicine

## 2017-01-01 ENCOUNTER — Ambulatory Visit (INDEPENDENT_AMBULATORY_CARE_PROVIDER_SITE_OTHER): Payer: BLUE CROSS/BLUE SHIELD | Admitting: Family Medicine

## 2017-01-01 VITALS — BP 128/88 | HR 69 | Temp 97.4°F | Wt 209.5 lb

## 2017-01-01 DIAGNOSIS — I2510 Atherosclerotic heart disease of native coronary artery without angina pectoris: Secondary | ICD-10-CM | POA: Diagnosis not present

## 2017-01-01 DIAGNOSIS — I10 Essential (primary) hypertension: Secondary | ICD-10-CM

## 2017-01-01 DIAGNOSIS — R404 Transient alteration of awareness: Secondary | ICD-10-CM

## 2017-01-01 NOTE — Progress Notes (Signed)
Subjective:    Patient ID: Lindsay Fry, female    DOB: 11-11-1960, 56 y.o.   MRN: [id number]  HPI This is a 56 yo female, accompanied by her husband, who presents today with episode last week of lethargy and collapse. They were going through the [place] drive through and she was "out of it." Unable to hold conversation or ambulate. Gradually became more awake, but vision was blurry, she felt confused and was staggering with walking. Had difficulty getting words out. By next day was back to normal. She called triage line and was told to go to ER where blood work was done but she left after waiting for 5 hours.  Blood sugars running normal. Did not check blood sugar during episode. No change in medication, no OTC meds, did not take meds incorrectly to her knowledge. No chest pain, no SOB, no abdominal pain, no diarrhea/constipation. No headaches, did not feel that she was ill or coming down with something.  Had cataract surgery earlier this week. Did fine.   Past Medical History:  Diagnosis Date  . ADD (attention deficit disorder) 11/19/2012  . Allergic rhinitis, cause unspecified   . Anxiety state, unspecified   . Arthritis   . Asymptomatic postmenopausal status (age-related) (natural)   . Atypical hyperplasia of left breast 2000  . Benign neoplasm of other and unspecified site of the digestive system   . Bipolar disorder ([place])   . Breast cancer ([place])    Left Breast 71m invasive CA left uiq, dcis left uoq and a lot ADH, ALH in both breasts.  .Marland KitchenCAD (coronary artery disease)    a. 08/2014 NSTEMI/Cath: LM nl, LAD 50p, D1/D2 min irregs, LCX nl, OM1 40, LPDA nl, RI 99ost (2.048mvessel->med Rx), RCA nl, AM 60, nl EF.  . Carcinoma of left breast ([place]/19/2016  . Chronic diastolic CHF (congestive heart failure) ([facility]   a. 08/2014 Echo: EF 60-65%, Gr 1 DD, nl LA.  . Marland Kitchenyst    on Achilles tendon  . Depression   . Diabetes mellitus ([place]   frank  . Dysuria   . Edema   . Family  history of malignant neoplasm of gastrointestinal tract   . Fibromyalgia   . GERD (gastroesophageal reflux disease)   . Glaucoma    pre glaucoma  . Headache    migraines  . Hiatal hernia   . History of alcoholism ([place]  . History of migraines   . History of ovarian cyst   . Insomnia, unspecified   . Myocardial infarction ([place]8-[place]-16  . OCD (obsessive compulsive disorder)   . Osteopenia 09/25/2016   Femoral neck T -1.1  9/18  . Other screening mammogram   . Pure hypercholesterolemia   . Rosacea   . Subacute confusional state 11/19/2012  . Tubular adenoma of colon 01/10/11  . Ulcerative colitis, unspecified   . Unspecified asthma(493.90)   . Unspecified essential hypertension   . Unspecified hypothyroidism   . Unspecified vitamin D deficiency   . Vertigo    Past Surgical History:  Procedure Laterality Date  . APPENDECTOMY    . AXILLARY SENTINEL NODE BIOPSY Left 05/23/2014   Procedure: AXILLARY SENTINEL NODE BIOPSY;  Surgeon: SeChristene LyeMD;  Location: [facility] ORS;  Service: General;  Laterality: Left;  . BREAST BIOPSY  Aug 2000   on tamoxifen, atypical hyperplasia  . BREAST SURGERY Left Aug 2000   lumpectomy/ Dr CrSharlet Salina. BREAST SURGERY Bilateral 05/23/14   Mastectomy  .  BUNIONECTOMY Right   . CARDIAC CATHETERIZATION N/A 09/05/2014   Procedure: Left Heart Cath and Coronary Angiography;  Surgeon: Wellington Hampshire, MD;  Location: [place] [facility] LAB;  Service: Cardiovascular;  Laterality: N/A;  . CARDIAC CATHETERIZATION  09-05-14  . CESAREAN SECTION  1996/1997   placenta previa, gest DM, pre-eclampsia  . cholecystect  1999  . CHOLECYSTECTOMY  1997   adhesions also  . COLONOSCOPY  01/2001   Ulcerative colitis  . COLONOSCOPY  04/2004   UC, polyp  . COLONOSCOPY  12/08   UC, no polyps  . DEXA  04/1999 and 2010   normal  . ESOPHAGOGASTRODUODENOSCOPY  09/2001   polyp  . exercise stress test  11/2003   negative  . FEMUR FRACTURE SURGERY Left   . MASTECTOMY  Bilateral 05-23-14   Dr. Jamal Collin  . MECKEL DIVERTICULUM EXCISION  1999  . NASAL SINUS SURGERY  05/1997  . nuclear stress test  06/2007   negative  . precancerous mole removed    . SIMPLE MASTECTOMY WITH AXILLARY SENTINEL NODE BIOPSY Bilateral 05/23/2014   Procedure: Bilateral simple mastectomy, left sentinel node biopsy ;  Surgeon: Christene Lye, MD;  Location: [facility] ORS;  Service: General;  Laterality: Bilateral;  . Sleep study  11/09   no apnea, but did snore (done by [facility])  . TONSILLECTOMY  1984  . TUBAL LIGATION    . WISDOM TOOTH EXTRACTION  1980's   Family History  Problem Relation Age of Onset  . Coronary artery disease Father   . Colon cancer Father   . Alzheimer's disease Father   . Dementia Father   . Coronary artery disease Mother   . Hypertension Mother   . Osteoporosis Mother   . Colitis Mother   . Crohn's disease Mother   . Breast cancer Unknown        great aunts  . Coronary artery disease Unknown        Uncle (also AAA)  . Diabetes Unknown        remote family history  . Stroke Cousin   . Esophageal cancer Neg Hx   . Rectal cancer Neg Hx   . Stomach cancer Neg Hx    Social History   Tobacco Use  . Smoking status: Former Smoker    Packs/day: 0.25    Years: 5.00    Pack years: 1.25    Types: Cigarettes    Last attempt to quit: 01/14/1993    Years since quitting: 23.9  . Smokeless tobacco: Former Network engineer Use Topics  . Alcohol use: No    Alcohol/week: 0.0 oz    Comment: Recovered ETOH  . Drug use: No      Review of Systems Per HPI    Objective:   Physical Exam  Constitutional: She is oriented to person, place, and time. She appears well-developed and well-nourished.  HENT:  Head: Normocephalic and atraumatic.  Mouth/Throat: Oropharynx is clear and moist. No oropharyngeal exudate.  Eyes: Conjunctivae and EOM are normal. Pupils are equal, round, and reactive to light. Right eye exhibits no discharge. Left eye exhibits no discharge.  No scleral icterus.  Neck: Normal range of motion. Neck supple.  Cardiovascular: Normal rate, regular rhythm and normal heart sounds.  Pulmonary/Chest: Effort normal and breath sounds normal.  Musculoskeletal: She exhibits no edema.  Neurological: She is alert and oriented to person, place, and time.  Cranial nerves: CN I: not tested CN II: pupils equal, round and reactive to light, visual  fields intact, fundi unremarkable, without vessel changes, exudates, hemorrhages or papilledema. CN III, IV, VI: full range of motion, no nystagmus, no ptosis CN V: facial sensation intact CN VII: upper and lower face symmetric CN VIII: hearing intact CN IX, X: gag intact, uvula midline CN XI: sternocleidomastoid and trapezius muscles intact CN XII: tongue midline Bulk & Tone: normal, no fasciculations. Motor: 4/5 throughout. Deep Tendon Reflexes: 1+ throughout.  Finger to nose testing: Without dysmetria.  Heel to shin: Some difficulty.  Gait: Normal station and stride. Able to turn and tandem walk. Romberg negative.   Skin: Skin is warm and dry. She is not diaphoretic.  Psychiatric: She has a normal mood and affect. Her behavior is normal. Judgment and thought content normal.  Vitals reviewed.     BP 128/88 (BP Location: Right Arm, Patient Position: Sitting, Cuff Size: Normal)   Pulse 69   Temp (!) 97.4 F (36.3 C) (Oral)   Wt 209 lb 8 oz (95 kg)   LMP 08/23/2006 (Approximate) Comment: tubal ligation  SpO2 96%   BMI 35.96 kg/m  Wt Readings from Last 3 Encounters:  01/01/17 209 lb 8 oz (95 kg)  12/26/16 205 lb (93 kg)  12/03/16 211 lb 6.4 oz (95.9 kg)       Assessment & Plan:  1. Altered level of consciousness - CT Head Wo Contrast; Future -If it that occurred last week seems to have resolved with no focal abnormalities.  Discussed with Dr. Glori Bickers who agreed to plan. -Discussed importance of returning to emergency room immediately if symptoms return  2. Essential  hypertension -Well-controlled on current meds - CT Head Wo Contrast; Future  3. Coronary artery disease involving native coronary artery of native heart without angina pectoris -Continue current meds   Clarene Reamer, FNP-BC  [facility]  01/01/2017 1:51 PM

## 2017-01-01 NOTE — Patient Instructions (Signed)
I have ordered a head ct for you, you should get a call in the next couple of days to schedule  If you have additional episodes, please go to the ER immediately.

## 2017-01-03 ENCOUNTER — Ambulatory Visit
Admission: RE | Admit: 2017-01-03 | Discharge: 2017-01-03 | Disposition: A | Discharge status: Home / Self Care | Payer: BLUE CROSS/BLUE SHIELD | Source: Ambulatory Visit | Attending: Family Medicine | Admitting: Family Medicine

## 2017-01-03 DIAGNOSIS — R404 Transient alteration of awareness: Secondary | ICD-10-CM | POA: Diagnosis not present

## 2017-01-03 DIAGNOSIS — I10 Essential (primary) hypertension: Secondary | ICD-10-CM | POA: Diagnosis not present

## 2017-01-03 DIAGNOSIS — R531 Weakness: Secondary | ICD-10-CM | POA: Diagnosis not present

## 2017-01-10 ENCOUNTER — Other Ambulatory Visit: Payer: Self-pay | Admitting: Internal Medicine

## 2017-01-20 ENCOUNTER — Other Ambulatory Visit: Payer: Self-pay | Admitting: Endocrinology

## 2017-01-28 ENCOUNTER — Ambulatory Visit (INDEPENDENT_AMBULATORY_CARE_PROVIDER_SITE_OTHER): Payer: BLUE CROSS/BLUE SHIELD | Admitting: Psychology

## 2017-01-28 DIAGNOSIS — F4321 Adjustment disorder with depressed mood: Secondary | ICD-10-CM

## 2017-01-30 DIAGNOSIS — Z1211 Encounter for screening for malignant neoplasm of colon: Secondary | ICD-10-CM | POA: Diagnosis not present

## 2017-01-30 DIAGNOSIS — Z124 Encounter for screening for malignant neoplasm of cervix: Secondary | ICD-10-CM | POA: Diagnosis not present

## 2017-01-30 DIAGNOSIS — Z8041 Family history of malignant neoplasm of ovary: Secondary | ICD-10-CM | POA: Diagnosis not present

## 2017-01-30 DIAGNOSIS — R140 Abdominal distension (gaseous): Secondary | ICD-10-CM | POA: Diagnosis not present

## 2017-02-03 ENCOUNTER — Ambulatory Visit: Payer: Self-pay | Admitting: Endocrinology

## 2017-02-04 DIAGNOSIS — F3132 Bipolar disorder, current episode depressed, moderate: Secondary | ICD-10-CM | POA: Diagnosis not present

## 2017-02-06 ENCOUNTER — Other Ambulatory Visit (INDEPENDENT_AMBULATORY_CARE_PROVIDER_SITE_OTHER): Payer: BLUE CROSS/BLUE SHIELD

## 2017-02-06 ENCOUNTER — Telehealth: Payer: Self-pay | Admitting: Family Medicine

## 2017-02-06 DIAGNOSIS — E890 Postprocedural hypothyroidism: Secondary | ICD-10-CM

## 2017-02-06 DIAGNOSIS — E119 Type 2 diabetes mellitus without complications: Secondary | ICD-10-CM

## 2017-02-06 LAB — MICROALBUMIN / CREATININE URINE RATIO
Creatinine,U: 113.5 mg/dL
Microalb Creat Ratio: 3.2 mg/g (ref 0.0–30.0)
Microalb, Ur: 3.6 mg/dL — ABNORMAL HIGH (ref 0.0–1.9)

## 2017-02-06 LAB — LIPID PANEL
Cholesterol: 137 mg/dL (ref 0–200)
HDL: 41.9 mg/dL (ref >39.00)
LDL Cholesterol: 69 mg/dL (ref 0–99)
NonHDL: 94.98
Total CHOL/HDL Ratio: 3
Triglycerides: 128 mg/dL (ref 0.0–149.0)
VLDL: 25.6 mg/dL (ref 0.0–40.0)

## 2017-02-06 LAB — BASIC METABOLIC PANEL
BUN: 12 mg/dL (ref 6–23)
CO2: 27 mEq/L (ref 19–32)
Calcium: 9.8 mg/dL (ref 8.4–10.5)
Chloride: 102 mEq/L (ref 96–112)
Creatinine, Ser: 0.91 mg/dL (ref 0.40–1.20)
GFR: 67.36 mL/min (ref >60.00)
Glucose, Bld: 153 mg/dL — ABNORMAL HIGH (ref 70–99)
Potassium: 3.7 mEq/L (ref 3.5–5.1)
Sodium: 139 mEq/L (ref 135–145)

## 2017-02-06 LAB — T4, FREE: Free T4: 1.15 ng/dL (ref 0.60–1.60)

## 2017-02-06 LAB — TSH: TSH: 0.32 u[IU]/mL — ABNORMAL LOW (ref 0.35–4.50)

## 2017-02-06 LAB — HEMOGLOBIN A1C: Hgb A1c MFr Bld: 6.4 % (ref 4.6–6.5)

## 2017-02-06 LAB — VITAMIN D 25 HYDROXY (VIT D DEFICIENCY, FRACTURES): VITD: 24.75 ng/mL — ABNORMAL LOW (ref 30.00–100.00)

## 2017-02-06 NOTE — Telephone Encounter (Signed)
-----   Message from [name] sent at 02/06/2017  7:32 AM EST ----- Regarding: lab orders, asap Lab orders, please.... Or are we doing Lindsay Fry labs?

## 2017-02-10 ENCOUNTER — Ambulatory Visit: Payer: Self-pay | Admitting: Endocrinology

## 2017-02-19 ENCOUNTER — Encounter: Payer: Self-pay | Admitting: Family Medicine

## 2017-02-21 ENCOUNTER — Encounter: Payer: Self-pay | Admitting: Family Medicine

## 2017-02-26 ENCOUNTER — Encounter: Payer: Self-pay | Admitting: Endocrinology

## 2017-02-27 ENCOUNTER — Ambulatory Visit (INDEPENDENT_AMBULATORY_CARE_PROVIDER_SITE_OTHER): Payer: BLUE CROSS/BLUE SHIELD | Admitting: Psychology

## 2017-02-27 DIAGNOSIS — F3131 Bipolar disorder, current episode depressed, mild: Secondary | ICD-10-CM | POA: Diagnosis not present

## 2017-02-28 ENCOUNTER — Inpatient Hospital Stay: Payer: BLUE CROSS/BLUE SHIELD | Attending: Internal Medicine

## 2017-02-28 ENCOUNTER — Encounter: Payer: Self-pay | Admitting: Internal Medicine

## 2017-02-28 ENCOUNTER — Other Ambulatory Visit: Payer: Self-pay

## 2017-02-28 ENCOUNTER — Inpatient Hospital Stay (HOSPITAL_BASED_OUTPATIENT_CLINIC_OR_DEPARTMENT_OTHER): Payer: BLUE CROSS/BLUE SHIELD | Admitting: Internal Medicine

## 2017-02-28 VITALS — BP 116/77 | HR 80 | Temp 97.9°F | Resp 20 | Ht 64.0 in | Wt 210.0 lb

## 2017-02-28 DIAGNOSIS — Z170 Estrogen receptor positive status [ER+]: Secondary | ICD-10-CM

## 2017-02-28 DIAGNOSIS — Z9013 Acquired absence of bilateral breasts and nipples: Secondary | ICD-10-CM | POA: Insufficient documentation

## 2017-02-28 DIAGNOSIS — C50812 Malignant neoplasm of overlapping sites of left female breast: Secondary | ICD-10-CM

## 2017-02-28 DIAGNOSIS — E039 Hypothyroidism, unspecified: Secondary | ICD-10-CM | POA: Insufficient documentation

## 2017-02-28 DIAGNOSIS — Z79811 Long term (current) use of aromatase inhibitors: Secondary | ICD-10-CM | POA: Insufficient documentation

## 2017-02-28 DIAGNOSIS — D0511 Intraductal carcinoma in situ of right breast: Secondary | ICD-10-CM | POA: Diagnosis not present

## 2017-02-28 LAB — COMPREHENSIVE METABOLIC PANEL
ALT: 44 U/L (ref 14–54)
AST: 39 U/L (ref 15–41)
Albumin: 4.2 g/dL (ref 3.5–5.0)
Alkaline Phosphatase: 126 U/L (ref 38–126)
Anion gap: 16 — ABNORMAL HIGH (ref 5–15)
BUN: 12 mg/dL (ref 6–20)
CO2: 18 mmol/L — ABNORMAL LOW (ref 22–32)
Calcium: 9.6 mg/dL (ref 8.9–10.3)
Chloride: 106 mmol/L (ref 101–111)
Creatinine, Ser: 0.9 mg/dL (ref 0.44–1.00)
GFR calc Af Amer: >60 mL/min (ref >60)
GFR calc non Af Amer: >60 mL/min (ref >60)
Glucose, Bld: 171 mg/dL — ABNORMAL HIGH (ref 65–99)
Potassium: 3.9 mmol/L (ref 3.5–5.1)
Sodium: 140 mmol/L (ref 135–145)
Total Bilirubin: 0.8 mg/dL (ref 0.3–1.2)
Total Protein: 7.2 g/dL (ref 6.5–8.1)

## 2017-02-28 LAB — CBC WITH DIFFERENTIAL/PLATELET
Basophils Absolute: 0.1 10*3/uL (ref 0–0.1)
Basophils Relative: 1 %
Eosinophils Absolute: 0.3 10*3/uL (ref 0–0.7)
Eosinophils Relative: 3 %
HCT: 46.2 % (ref 35.0–47.0)
Hemoglobin: 15.5 g/dL (ref 12.0–16.0)
Lymphocytes Relative: 21 %
Lymphs Abs: 2 10*3/uL (ref 1.0–3.6)
MCH: 30.7 pg (ref 26.0–34.0)
MCHC: 33.6 g/dL (ref 32.0–36.0)
MCV: 91.4 fL (ref 80.0–100.0)
Monocytes Absolute: 0.7 10*3/uL (ref 0.2–0.9)
Monocytes Relative: 8 %
Neutro Abs: 6.4 10*3/uL (ref 1.4–6.5)
Neutrophils Relative %: 67 %
Platelets: 391 10*3/uL (ref 150–440)
RBC: 5.06 MIL/uL (ref 3.80–5.20)
RDW: 14 % (ref 11.5–14.5)
WBC: 9.5 10*3/uL (ref 3.6–11.0)

## 2017-02-28 MED ORDER — LETROZOLE 2.5 MG PO TABS: 2.5000 mg | ORAL_TABLET | Freq: Every day | ORAL | 12 refills | Status: DC

## 2017-02-28 NOTE — Assessment & Plan Note (Addendum)
#   LEFT Breast Stage I s/p mastec [prophy right mastec] on Low risk mammoprint- on Letrozle. NED.  Clinically no evidence of recurrence.  # Ulcerative colitis/ polyps- s/p colonoscopy- april 2018 [Dr.Perry; [place]]; repeat colonoscopy in summer 2019.    # Arthritis- sec to AI. stable.    # mom- no cancer; mat great aunts/breast cancer; dad- all sons; no siblings; refer to [place].   # BMD- Sep 2018- osteopenia. Con ca+vit D  # follow up in 6 months/labs.   Dr. Levonne Spiller- Pscychiatrist; [place].

## 2017-02-28 NOTE — Progress Notes (Signed)
[place] OFFICE PROGRESS NOTE  Patient Care Team: Tower, Wynelle Fanny, MD as PCP - General Elayne Snare, MD as Consulting Physician (Endocrinology) Chucky May, MD as Consulting Physician (Psychiatry) Laurey Morale, Wonda Cheng, MD (Obstetrics and Gynecology) Christene Lye, MD (General Surgery) Minna Merritts, MD as Consulting Physician (Cardiology)  Cancer Staging Carcinoma of left breast [facility]) Staging form: Breast, AJCC 7th Edition - Clinical: Stage IA (T1b, N0, M0) - Unsigned Staging comments: Kidney frequent changes in the 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  No invasive cancer in the right breast.  Status post bilateral mastectomy    Oncology History   1.  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  Patient underwent bilateral mastectomy  May 9 , 2016  Patient has invasive carcinoma and left breast with significant changes in the right breast ductal carcinoma in situ patient underwent bilateral mastectomy.  Left breast: T1 b N0 M0 stage IB estrogen and progesterone receptor HER-2/neu- Not overexpressed  2.    Multi-gene analysis with  MAMOPRINT low risk for recurrent disease Patient was started on letrozole and calcium and vitamin D (June, 2016)     Carcinoma of left breast ([place])   05/23/2014 Initial Diagnosis    Carcinoma of left breast       Carcinoma of overlapping sites of left breast in female, estrogen receptor positive ([place])    INTERVAL HISTORY:  DORNA MALLET 57 y.o.  female pleasant patient above history of Left-sided breast cancer and also right-sided DCIS status post bilateral mastectomy currently on letrozole is here for follow-up.   Patient denies any lumps or bumps. She has  chronic arthralgias which are improved on Neurontin.    REVIEW OF SYSTEMS:  A complete 10 point review of system is done which is negative except mentioned above/history of present illness.   PAST MEDICAL HISTORY :  Past Medical History:  Diagnosis Date  . ADD (attention deficit disorder) 11/19/2012  . Allergic rhinitis, cause unspecified   . Anxiety state, unspecified   . Arthritis   . Asymptomatic postmenopausal status (age-related) (natural)   . Atypical hyperplasia of left breast 2000  . Benign neoplasm of other and unspecified site of the digestive system   . Bipolar disorder ([place])   . Breast cancer ([place])    Left Breast 36m invasive CA left uiq, dcis left uoq and a lot ADH, ALH in both breasts.  .Marland KitchenCAD (coronary artery disease)    a. 08/2014 NSTEMI/Cath: LM nl, LAD 50p, D1/D2 min irregs, LCX nl, OM1 40, LPDA nl, RI 99ost (2.052mvessel->med Rx), RCA nl, AM 60, nl EF.  . Carcinoma of left breast (HCC[place]5/19/2016  . Chronic diastolic CHF (congestive heart failure) ([facility]CC[place]   a. 08/2014 Echo: EF 60-65%, Gr 1 DD, nl LA.  . Marland Kitchenyst    on Achilles tendon  . Depression   . Diabetes mellitus ([place]CD[place]   frank  . Dysuria   . Edema   . Family history of malignant neoplasm of gastrointestinal tract   . Fibromyalgia   . GERD (gastroesophageal reflux disease)   . Glaucoma    pre glaucoma  . Headache    migraines  . Hiatal hernia   . History of alcoholism (  [place])   . History of migraines   . History of ovarian cyst   . Insomnia, unspecified   . Myocardial infarction ([place]) 09-04-14  . OCD (obsessive compulsive disorder)   . Osteopenia 09/25/2016   Femoral neck T -1.1  9/18  . Other screening mammogram   . Pure hypercholesterolemia   . Rosacea   . Subacute confusional state 11/19/2012  . Tubular adenoma of colon 01/10/11  . Ulcerative colitis, unspecified   . Unspecified asthma(493.90)   . Unspecified essential hypertension   . Unspecified hypothyroidism   . Unspecified vitamin D deficiency    . Vertigo     PAST SURGICAL HISTORY :   Past Surgical History:  Procedure Laterality Date  . APPENDECTOMY    . AXILLARY SENTINEL NODE BIOPSY Left 05/23/2014   Procedure: AXILLARY SENTINEL NODE BIOPSY;  Surgeon: Christene Lye, MD;  Location: [facility] ORS;  Service: General;  Laterality: Left;  . BREAST BIOPSY  Aug 2000   on tamoxifen, atypical hyperplasia  . BREAST SURGERY Left Aug 2000   lumpectomy/ Dr Sharlet Salina  . BREAST SURGERY Bilateral 05/23/14   Mastectomy  . BUNIONECTOMY Right   . CARDIAC CATHETERIZATION N/A 09/05/2014   Procedure: Left Heart Cath and Coronary Angiography;  Surgeon: Wellington Hampshire, MD;  Location: [place] CV LAB;  Service: Cardiovascular;  Laterality: N/A;  . CARDIAC CATHETERIZATION  09-05-14  . CATARACT EXTRACTION, BILATERAL    . CESAREAN SECTION  1996/1997   placenta previa, gest DM, pre-eclampsia  . cholecystect  1999  . CHOLECYSTECTOMY  1997   adhesions also  . COLONOSCOPY  01/2001   Ulcerative colitis  . COLONOSCOPY  04/2004   UC, polyp  . COLONOSCOPY  12/08   UC, no polyps  . DEXA  04/1999 and 2010   normal  . ESOPHAGOGASTRODUODENOSCOPY  09/2001   polyp  . exercise stress test  11/2003   negative  . FEMUR FRACTURE SURGERY Left   . MASTECTOMY Bilateral 05-23-14   Dr. Jamal Collin  . MECKEL DIVERTICULUM EXCISION  1999  . NASAL SINUS SURGERY  05/1997  . nuclear stress test  06/2007   negative  . precancerous mole removed    . SIMPLE MASTECTOMY WITH AXILLARY SENTINEL NODE BIOPSY Bilateral 05/23/2014   Procedure: Bilateral simple mastectomy, left sentinel node biopsy ;  Surgeon: Christene Lye, MD;  Location: [facility] ORS;  Service: General;  Laterality: Bilateral;  . Sleep study  11/09   no apnea, but did snore (done by [facility])  . TONSILLECTOMY  1984  . TUBAL LIGATION    . WISDOM TOOTH EXTRACTION  1980's    FAMILY HISTORY :   Family History  Problem Relation Age of Onset  . Coronary artery disease Father   . Colon cancer Father   .  Alzheimer's disease Father   . Dementia Father   . Coronary artery disease Mother   . Hypertension Mother   . Osteoporosis Mother   . Colitis Mother   . Crohn's disease Mother   . Breast cancer Unknown        great aunts  . Coronary artery disease Unknown        Uncle (also AAA)  . Diabetes Unknown        remote family history  . Stroke Cousin   . Esophageal cancer Neg Hx   . Rectal cancer Neg Hx   . Stomach cancer Neg Hx     SOCIAL HISTORY:   Social History   Tobacco Use  .  Smoking status: Former Smoker    Packs/day: 0.25    Years: 5.00    Pack years: 1.25    Types: Cigarettes    Last attempt to quit: 01/14/1993    Years since quitting: 24.1  . Smokeless tobacco: Former Network engineer Use Topics  . Alcohol use: No    Alcohol/week: 0.0 oz    Comment: Recovered ETOH  . Drug use: No    ALLERGIES:  is allergic to ephedrine; aspartame and phenylalanine; aspirin; crestor [rosuvastatin]; erythromycin; nsaids; and oxycodone.  MEDICATIONS:  Current Outpatient Medications  Medication Sig Dispense Refill  . amantadine (SYMMETREL) 100 MG capsule Take 100 mg by mouth 2 (two) times daily.    Marland Kitchen atorvastatin (LIPITOR) 80 MG tablet TAKE 1 TABLET(80 MG) BY MOUTH DAILY 90 tablet 3  . b complex vitamins capsule Take 1 capsule by mouth daily.    . B-D UF III MINI PEN NEEDLES 31G X 5 MM MISC USE FOR INJECTIONS TWICE DAILY 100 each 0  . BAYER MICROLET LANCETS lancets USE AS DIRECTED TO CHECK BLOOD SUGAR TWICE DAILY 200 each 2  . Blood Glucose Monitoring Suppl (CONTOUR NEXT EZ MONITOR) w/Device KIT USE AS DIRECTED TO CHECK BLOOD SUGAR TWICE DAILY 1 kit 2  . BYSTOLIC 10 MG tablet TAKE 1 TABLET BY MOUTH IN THE MORNING AND 2 TABLETS AFTER LUNCH 270 tablet 3  . CARTIA XT 240 MG 24 hr capsule TAKE 1 CAPSULE(240 MG) BY MOUTH DAILY 90 capsule 3  . chlorproMAZINE (THORAZINE) 25 MG tablet Take 25-50 mg by mouth as needed. Reported on 03/02/2015    . Cholecalciferol (VITAMIN D3) 2000 UNITS TABS Take  4 tablets by mouth daily.     . cloNIDine (CATAPRES) 0.1 MG tablet TAKE 1 TABLET(0.1 MG) BY MOUTH TWICE DAILY 180 tablet 3  . clopidogrel (PLAVIX) 75 MG tablet Take 1 tablet (75 mg total) by mouth daily. 90 tablet 3  . co-enzyme Q-10 50 MG capsule Take 50 mg by mouth daily.    . diazepam (VALIUM) 10 MG tablet Take 2.5-10 mg by mouth as needed for anxiety.    . gabapentin (NEURONTIN) 300 MG capsule TAKE 1 CAPSULE(300 MG) BY MOUTH THREE TIMES DAILY 90 capsule 11  . glucose blood (BAYER CONTOUR NEXT TEST) test strip USE AS DIRECTED TO CHECK BLOOD SUGAR TWICE DAILY 200 each 2  . Insulin Detemir (LEVEMIR FLEXTOUCH) 100 UNIT/ML Pen INJECT 40 UNITS UNDER THE SKIN DAILY AT 10 PM (Patient taking differently: INJECT 45 UNITS UNDER THE SKIN DAILY AT 10 PM) 15 mL 3  . INVOKANA 300 MG TABS tablet TAKE 1 TABLET(300 MG) BY MOUTH DAILY BEFORE BREAKFAST 30 tablet 3  . lamoTRIgine (LAMICTAL) 200 MG tablet Take 400 mg by mouth daily.    Marland Kitchen latanoprost (XALATAN) 0.005 % ophthalmic solution Place 1 drop into both eyes At bedtime.    Marland Kitchen letrozole (FEMARA) 2.5 MG tablet Take 1 tablet (2.5 mg total) by mouth daily. 30 tablet 12  . levothyroxine (SYNTHROID) 112 MCG tablet Take 2 tablets (224 mcg total) by mouth daily before breakfast. 60 tablet 3  . LINZESS 145 MCG CAPS capsule TAKE 1 CAPSULE(145 MCG) BY MOUTH DAILY BEFORE BREAKFAST 30 capsule 0  . lithium carbonate (ESKALITH) 450 MG CR tablet Take 900 mg by mouth at bedtime.    . Melatonin 10 MG CAPS Take 1 capsule by mouth at bedtime.    . mesalamine (LIALDA) 1.2 g EC tablet Take 2 tablets (2.4 g total) by mouth daily. [place]  tablet 11  . Multiple Vitamin (MULTIVITAMIN) tablet Take 1 tablet by mouth daily.      . nitrofurantoin (MACRODANTIN) 100 MG capsule Take 100 mg by mouth as directed. Reported on 03/02/2015    . Omega-3 Krill Oil 500 MG CAPS Take 500 mg by mouth.    . ondansetron (ZOFRAN ODT) 4 MG disintegrating tablet Take 1 each morning then every 6 hours for nausea.  120 tablet 2  . OZEMPIC 0.25 or 0.5 MG/DOSE SOPN INJECT 0.5 MG UNDER THE SKIN ONCE A WEEK 2 pen 2  . pantoprazole (PROTONIX) 40 MG tablet TAKE 1 TABLET BY MOUTH TWICE DAILY 60 tablet 6  . Probiotic Product (PROBIOTIC DAILY PO) Take by mouth.    . ranitidine (ZANTAC) 300 MG tablet Take 0.5 tablets (150 mg total) by mouth 2 (two) times daily. 60 tablet 3  . Simethicone (GAS-X PO) Take 1 tablet by mouth 4 (four) times daily.    . valACYclovir (VALTREX) 500 MG tablet Take 1 tablet (500 mg total) by mouth 2 (two) times daily. 60 tablet 11  . vitamin C (ASCORBIC ACID) 500 MG tablet Take 250 mg by mouth daily. Takes 2 a day    . Asenapine Maleate (SAPHRIS) 10 MG SUBL Place 10 mg under the tongue at bedtime.     . diphenoxylate-atropine (LOMOTIL) 2.5-0.025 MG tablet Take 1-2 tablets by mouth 3 times a day as needed for diarrhea (Patient not taking: Reported on 02/28/2017) 90 tablet 0  . QUEtiapine (SEROQUEL XR) 400 MG 24 hr tablet Take 1 tablet by mouth at bedtime.  1   Current Facility-Administered Medications  Medication Dose Route Frequency Provider Last Rate Last Dose  . 0.9 %  sodium chloride infusion  500 mL Intravenous Continuous Irene Shipper, MD        PHYSICAL EXAMINATION: ECOG PERFORMANCE STATUS: 0 - Asymptomatic  BP 116/77   Pulse 80   Temp 97.9 F (36.6 C) (Tympanic)   Resp 20   Ht _0  (1.626 m)   Wt 210 lb (95.3 kg)   LMP 08/23/2006 (Approximate) Comment: tubal ligation  BMI 36.05 kg/m   Filed Weights   02/28/17 1531  Weight: 210 lb (95.3 kg)    GENERAL: Well-nourished well-developed; Alert, no distress and comfortable.   Alone.  EYES: no pallor or icterus OROPHARYNX: no thrush or ulceration; good dentition  NECK: supple, no masses felt LYMPH:  no palpable lymphadenopathy in the cervical, axillary or inguinal regions LUNGS: clear to auscultation and  No wheeze or crackles HEART/CVS: regular rate & rhythm and no murmurs; No lower extremity edema ABDOMEN:abdomen soft,  non-tender and normal bowel sounds Musculoskeletal:no cyanosis of digits and no clubbing  PSYCH: alert & oriented x 3 with fluent speech NEURO: no focal motor/sensory deficits SKIN:  no rashes or significant lesions .   LABORATORY DATA:  I have reviewed the data as listed    Component Value Date/Time   NA 140 02/28/2017 1500   K 3.9 02/28/2017 1500   CL 106 02/28/2017 1500   CO2 18 (L) 02/28/2017 1500   GLUCOSE 171 (H) 02/28/2017 1500   BUN 12 02/28/2017 1500   CREATININE 0.90 02/28/2017 1500   CALCIUM 9.6 02/28/2017 1500   PROT 7.2 02/28/2017 1500   ALBUMIN 4.2 02/28/2017 1500   AST 39 02/28/2017 1500   ALT 44 02/28/2017 1500   ALKPHOS 126 02/28/2017 1500   BILITOT 0.8 02/28/2017 1500   GFRNONAA >60 02/28/2017 1500   GFRAA >60 02/28/2017 1500  No results found for: SPEP, UPEP  Lab Results  Component Value Date   WBC 9.5 02/28/2017   NEUTROABS 6.4 02/28/2017   HGB 15.5 02/28/2017   HCT 46.2 02/28/2017   MCV 91.4 02/28/2017   PLT 391 02/28/2017      Chemistry      Component Value Date/Time   NA 140 02/28/2017 1500   K 3.9 02/28/2017 1500   CL 106 02/28/2017 1500   CO2 18 (L) 02/28/2017 1500   BUN 12 02/28/2017 1500   CREATININE 0.90 02/28/2017 1500      Component Value Date/Time   CALCIUM 9.6 02/28/2017 1500   ALKPHOS 126 02/28/2017 1500   AST 39 02/28/2017 1500   ALT 44 02/28/2017 1500   BILITOT 0.8 02/28/2017 1500       RADIOGRAPHIC STUDIES: I have personally reviewed the radiological images as listed and agreed with the findings in the report. No results found.   ASSESSMENT & PLAN:  Carcinoma of overlapping sites of left breast in female, estrogen receptor positive ([place]) # LEFT Breast Stage I s/p mastec [prophy right mastec] on Low risk mammoprint- on Letrozle. NED.  # Ulcerative colitis/ polyps- s/p colonoscopy- april 2018 [Dr.Perry; [place]]; repeat colonoscopy in summer 2019.    # Arthritis- sec to AI. stable.    # mom- no cancer; mat great  aunts/breast cancer; dad- all sons; no siblings; refer to [place].   # BMD- Sep 2018- osteopenia. Con ca+vit D  # follow up in 6 months/labs.   Dr. Levonne Spiller- Pscychiatrist; [place].    No orders of the defined types were placed in this encounter.     Cammie Sickle, MD 03/01/2017 7:20 AM

## 2017-03-01 ENCOUNTER — Telehealth: Payer: Self-pay | Admitting: Internal Medicine

## 2017-03-01 NOTE — Telephone Encounter (Signed)
Please make a referral to genetic counseling.  Thank you

## 2017-03-03 ENCOUNTER — Telehealth: Payer: Self-pay | Admitting: Genetic Counselor

## 2017-03-03 NOTE — Telephone Encounter (Addendum)
Patient has not called back. Closing referral 03/11/17  --------------------------------------------------------------  Left a second message today, 03/07/17, for Lindsay Fry to schedule appt.  --------------------------------------------------------------  Dr. Rogue Bussing is referring Lindsay Fry for genetic counseling due to a personal history of breast cancer. I left her a message to call and schedule this telegenetics visit to be done by phone at her convenience.   Steele Berg, [place], [place] Genetic Counselor Phone: [phone]

## 2017-03-03 NOTE — Telephone Encounter (Signed)
Referral for genetic counseling has been placed.

## 2017-03-05 ENCOUNTER — Encounter: Payer: Self-pay | Admitting: Family Medicine

## 2017-03-05 ENCOUNTER — Encounter: Payer: Self-pay | Admitting: Endocrinology

## 2017-03-07 ENCOUNTER — Encounter: Payer: Self-pay | Admitting: Endocrinology

## 2017-03-07 ENCOUNTER — Ambulatory Visit (INDEPENDENT_AMBULATORY_CARE_PROVIDER_SITE_OTHER): Payer: BLUE CROSS/BLUE SHIELD | Admitting: Endocrinology

## 2017-03-07 VITALS — BP 122/72 | HR 85 | Ht 64.0 in | Wt 213.2 lb

## 2017-03-07 DIAGNOSIS — E1165 Type 2 diabetes mellitus with hyperglycemia: Secondary | ICD-10-CM | POA: Diagnosis not present

## 2017-03-07 DIAGNOSIS — F3176 Bipolar disorder, in full remission, most recent episode depressed: Secondary | ICD-10-CM | POA: Diagnosis not present

## 2017-03-07 DIAGNOSIS — Z794 Long term (current) use of insulin: Secondary | ICD-10-CM | POA: Diagnosis not present

## 2017-03-07 DIAGNOSIS — F3174 Bipolar disorder, in full remission, most recent episode manic: Secondary | ICD-10-CM | POA: Diagnosis not present

## 2017-03-07 DIAGNOSIS — I10 Essential (primary) hypertension: Secondary | ICD-10-CM | POA: Diagnosis not present

## 2017-03-07 DIAGNOSIS — E890 Postprocedural hypothyroidism: Secondary | ICD-10-CM | POA: Diagnosis not present

## 2017-03-07 MED ORDER — INSULIN DETEMIR 100 UNIT/ML FLEXPEN: PEN_INJECTOR | SUBCUTANEOUS | 1 refills | Status: DC

## 2017-03-07 NOTE — Patient Instructions (Addendum)
2 inj of Ozempic of 0.5mg 

## 2017-03-07 NOTE — Progress Notes (Signed)
Patient ID: PEG FIFER, female   DOB: 03-22-1960, 57 y.o.   MRN: [id number]   Reason for Appointment:  Hypothyroidism and diabetes, followup visit  History of Present Illness:  DIABETES:  Prior history: With her high A1c of 6.7 in 03/2014 she had an abnormal glucose tolerance test indicating diabetes and 1 hour glucose of 300 She was started on metformin; however even with 1500 mg of metformin ER  blood sugars were not controlled especially fasting Subsequently with taking Janumet XR 100/1000 daily her blood sugars were much better Because of her weight gain and A1c going up to 7.3 along with occasional readings over 200 she was started on Victoza in 11/16 She has had progressive hyperglycemia previously and A1c was up to 8.6% in 4/17  She was started on Levemir insulin in 08/2015 because of progressive rise in blood sugars and inability to control with 3 other agents  Non-insulin hypoglycemic drugs: Ozempic 0.5 mg weekly metformin ER 2000 mg daily, Invokana 300 mg daily  INSULIN regimen: Levemir 48 units for 1 week at bedtime  A1c is last 6.4 in January, previously 6.8  Current management, blood sugar patterns and problems identified:  She is coming back after her visit in October, was not seen after her labs in January  She has gone to taking Seroquel in the last month and her psychiatrist told her that this is causing her weight gain and increased blood sugar  She has gone up on the dose of Levemir for the last week because of rising blood sugars  However overall blood sugars are still high and mostly 130-140 in the last few days  Blood sugars after supper are not any higher usually  Because of not being able to do water exercises she has not done any exercise at all and has gained back weight that she had lost  She has tolerated Ozempic 0.5 mg weekly, has only rare nausea which may be unrelated  She still thinks she is watching carbohydrates but not always  portions  GLUCOSE readings from review of download of the CONTOUR monitor  Mean values apply above for all meters except median for One Touch  PRE-MEAL Fasting Lunch Dinner Bedtime Overall  Glucose range:  122-170      Mean/median:  145    148   POST-MEAL PC Breakfast PC Lunch PC Dinner  Glucose range:    122-177  Mean/median:    153      Weight history:  Wt Readings from Last 3 Encounters:  03/07/17 213 lb 3.2 oz (96.7 kg)  02/28/17 210 lb (95.3 kg)  01/01/17 209 lb 8 oz (95 kg)   Previous consultation with dietitian: none   Exercise regimen: None recently    LABS:  Lab Results  Component Value Date   HGBA1C 6.4 02/06/2017   HGBA1C 6.4 10/25/2016   HGBA1C 6.8 (H) 05/21/2016   Lab Results  Component Value Date   MICROALBUR 3.6 (H) 02/06/2017   [place] 69 02/06/2017   CREATININE 0.90 02/28/2017     HYPOTHYROIDISM: This was first diagnosed in 1992 after her treatment for Graves' disease with I-131 She has been on relatively large doses of thyroxine supplements She was on 224 mcg since 11/13 and her TSH was normal in 3/14 Subjectively difficult to assess her thyroid since she tends to have fatigue chronically.  In 2014 her dose was reduced to 200 mcg daily and her dose has been fluctuating since then She had required periodic increase  in dosage in 05/2014 when her TSH was 20  She is  on 2 tablets of 112 g daily, continues on the brand name Synthroid  Her symptoms are usually nonspecific for hypothyroidism, mostly related to other medical problems including sleep disturbances and bipolar illness.  No unusual fatigue recently  She has been quite regular with the Synthroid every day before breakfast         TSH is consistently below normal now Despite reducing the dose in 2018 her TSH is still low normal in January No excess palpitations now  Lab Results  Component Value Date   TSH 0.32 (L) 02/06/2017   TSH 0.03 (L) 10/25/2016   TSH 0.27 (L) 05/21/2016    FREET4 1.15 02/06/2017   FREET4 1.76 (H) 10/25/2016   FREET4 0.96 11/10/2015     No visits with results within 1 Week(s) from this visit.  Latest known visit with results is:  Appointment on 02/28/2017  Component Date Value Ref Range Status  . WBC 02/28/2017 9.5  3.6 - 11.0 K/uL Final  . RBC 02/28/2017 5.06  3.80 - 5.20 MIL/uL Final  . Hemoglobin 02/28/2017 15.5  12.0 - 16.0 g/dL Final  . HCT 02/28/2017 46.2  35.0 - 47.0 % Final  . MCV 02/28/2017 91.4  80.0 - 100.0 fL Final  . MCH 02/28/2017 30.7  26.0 - 34.0 pg Final  . MCHC 02/28/2017 33.6  32.0 - 36.0 g/dL Final  . RDW 02/28/2017 14.0  11.5 - 14.5 % Final  . Platelets 02/28/2017 391  150 - 440 K/uL Final  . Neutrophils Relative % 02/28/2017 67  % Final  . Neutro Abs 02/28/2017 6.4  1.4 - 6.5 K/uL Final  . Lymphocytes Relative 02/28/2017 21  % Final  . Lymphs Abs 02/28/2017 2.0  1.0 - 3.6 K/uL Final  . Monocytes Relative 02/28/2017 8  % Final  . Monocytes Absolute 02/28/2017 0.7  0.2 - 0.9 K/uL Final  . Eosinophils Relative 02/28/2017 3  % Final  . Eosinophils Absolute 02/28/2017 0.3  0 - 0.7 K/uL Final  . Basophils Relative 02/28/2017 1  % Final  . Basophils Absolute 02/28/2017 0.1  0 - 0.1 K/uL Final   Performed at [facility], [address], [place], [place] [address]  . Sodium 02/28/2017 140  135 - 145 mmol/L Final  . Potassium 02/28/2017 3.9  3.5 - 5.1 mmol/L Final  . Chloride 02/28/2017 106  101 - 111 mmol/L Final  . CO2 02/28/2017 18* 22 - 32 mmol/L Final  . Glucose, Bld 02/28/2017 171* 65 - 99 mg/dL Final  . BUN 02/28/2017 12  6 - 20 mg/dL Final  . Creatinine, Ser 02/28/2017 0.90  0.44 - 1.00 mg/dL Final  . Calcium 02/28/2017 9.6  8.9 - 10.3 mg/dL Final  . Total Protein 02/28/2017 7.2  6.5 - 8.1 g/dL Final  . Albumin 02/28/2017 4.2  3.5 - 5.0 g/dL Final  . AST 02/28/2017 39  15 - 41 U/L Final  . ALT 02/28/2017 44  14 - 54 U/L Final  . Alkaline Phosphatase 02/28/2017 126  38 - 126 U/L Final  . Total  Bilirubin 02/28/2017 0.8  0.3 - 1.2 mg/dL Final  . GFR calc non Af Amer 02/28/2017 >60  >60 mL/min Final  . GFR calc Af Amer 02/28/2017 >60  >60 mL/min Final   Comment: (NOTE) The eGFR has been calculated using the CKD EPI equation. This calculation has not been validated in all clinical situations. eGFR's persistently <60 mL/min  signify possible Chronic Kidney Disease.   Georgiann Hahn gap 02/28/2017 16* 5 - 15 Final   Performed at [facility], [address] [place], [place] [address]    Allergies as of 03/07/2017      Reactions   Ephedrine Other (See Comments)   Pt becomes hyper   Aspartame And Phenylalanine Nausea Only   Aspirin    REACTION: aggrivates colitis   Crestor [rosuvastatin]    REACTION: increased lfts   Erythromycin    REACTION: GI upset   Nsaids    REACTION: aggrivate colitis   Other Other (See Comments)   Unable to take due to history of ulcerative colitis   Oxycodone Other (See Comments)   Panic Attack      Medication List        Accurate as of 03/07/17 11:59 PM. Always use your most recent med list.          amantadine 100 MG capsule Commonly known as:  SYMMETREL Take 100 mg by mouth 2 (two) times daily.   atorvastatin 80 MG tablet Commonly known as:  LIPITOR TAKE 1 TABLET(80 MG) BY MOUTH DAILY   b complex vitamins capsule Take 1 capsule by mouth daily.   B-D UF III MINI PEN NEEDLES 31G X 5 MM Misc Generic drug:  Insulin Pen Needle USE FOR INJECTIONS TWICE DAILY   BAYER MICROLET LANCETS lancets USE AS DIRECTED TO CHECK BLOOD SUGAR TWICE DAILY   BYSTOLIC 10 MG tablet Generic drug:  nebivolol TAKE 1 TABLET BY MOUTH IN THE MORNING AND 2 TABLETS AFTER LUNCH   CARTIA XT 240 MG 24 hr capsule Generic drug:  diltiazem TAKE 1 CAPSULE(240 MG) BY MOUTH DAILY   chlorproMAZINE 25 MG tablet Commonly known as:  THORAZINE Take 25-50 mg by mouth as needed. Reported on 03/02/2015   cloNIDine 0.1 MG tablet Commonly known as:  CATAPRES TAKE 1  TABLET(0.1 MG) BY MOUTH TWICE DAILY   clopidogrel 75 MG tablet Commonly known as:  PLAVIX Take 1 tablet (75 mg total) by mouth daily.   co-enzyme Q-10 50 MG capsule Take 50 mg by mouth daily.   CONTOUR NEXT EZ MONITOR w/Device Kit USE AS DIRECTED TO CHECK BLOOD SUGAR TWICE DAILY   diazepam 10 MG tablet Commonly known as:  VALIUM Take 2.5-10 mg by mouth as needed for anxiety.   diphenoxylate-atropine 2.5-0.025 MG tablet Commonly known as:  LOMOTIL Take 1-2 tablets by mouth 3 times a day as needed for diarrhea   gabapentin 300 MG capsule Commonly known as:  NEURONTIN TAKE 1 CAPSULE(300 MG) BY MOUTH THREE TIMES DAILY   GAS-X PO Take 1 tablet by mouth 4 (four) times daily.   glucose blood test strip Commonly known as:  BAYER CONTOUR NEXT TEST USE AS DIRECTED TO CHECK BLOOD SUGAR TWICE DAILY   Insulin Detemir 100 UNIT/ML Pen Commonly known as:  LEVEMIR FLEXTOUCH INJECT 48 UNITS UNDER THE SKIN DAILY AT 10 PM   INVOKANA 300 MG Tabs tablet Generic drug:  canagliflozin TAKE 1 TABLET(300 MG) BY MOUTH DAILY BEFORE BREAKFAST   lamoTRIgine 200 MG tablet Commonly known as:  LAMICTAL Take 400 mg by mouth daily.   latanoprost 0.005 % ophthalmic solution Commonly known as:  XALATAN Place 1 drop into both eyes At bedtime.   letrozole 2.5 MG tablet Commonly known as:  FEMARA Take 1 tablet (2.5 mg total) by mouth daily.   levothyroxine 112 MCG tablet Commonly known as:  SYNTHROID Take 2 tablets (224 mcg total) by  mouth daily before breakfast.   LINZESS 145 MCG Caps capsule Generic drug:  linaclotide TAKE 1 CAPSULE(145 MCG) BY MOUTH DAILY BEFORE BREAKFAST   lisdexamfetamine 40 MG capsule Commonly known as:  VYVANSE Take 40 mg by mouth every morning.   lithium carbonate 450 MG CR tablet Commonly known as:  ESKALITH Take 900 mg by mouth at bedtime.   Melatonin 10 MG Caps Take 1 capsule by mouth at bedtime.   mesalamine 1.2 g EC tablet Commonly known as:  LIALDA Take 2  tablets (2.4 g total) by mouth daily.   multivitamin tablet Take 1 tablet by mouth daily.   nitrofurantoin 100 MG capsule Commonly known as:  MACRODANTIN Take 100 mg by mouth as directed. Reported on 03/02/2015   Omega-3 Krill Oil 500 MG Caps Take 500 mg by mouth.   ondansetron 4 MG disintegrating tablet Commonly known as:  ZOFRAN ODT Take 1 each morning then every 6 hours for nausea.   OZEMPIC 0.25 or 0.5 MG/DOSE Sopn Generic drug:  Semaglutide INJECT 0.5 MG UNDER THE SKIN ONCE A WEEK   pantoprazole 40 MG tablet Commonly known as:  PROTONIX TAKE 1 TABLET BY MOUTH TWICE DAILY   PROBIOTIC DAILY PO Take by mouth.   QUEtiapine 400 MG 24 hr tablet Commonly known as:  SEROQUEL XR Take 1 tablet by mouth at bedtime.   ranitidine 300 MG tablet Commonly known as:  ZANTAC Take 0.5 tablets (150 mg total) by mouth 2 (two) times daily.   valACYclovir 500 MG tablet Commonly known as:  VALTREX Take 1 tablet (500 mg total) by mouth 2 (two) times daily.   vitamin C 500 MG tablet Commonly known as:  ASCORBIC ACID Take 250 mg by mouth daily. Takes 2 a day   Vitamin D3 2000 units Tabs Take 4 tablets by mouth daily.       Allergies:  Allergies  Allergen Reactions  . Ephedrine Other (See Comments)    Pt becomes hyper  . Aspartame And Phenylalanine Nausea Only  . Aspirin     REACTION: aggrivates colitis  . Crestor [Rosuvastatin]     REACTION: increased lfts  . Erythromycin     REACTION: GI upset  . Nsaids     REACTION: aggrivate colitis  . Other Other (See Comments)    Unable to take due to history of ulcerative colitis  . Oxycodone Other (See Comments)    Panic Attack    Past Medical History:  Diagnosis Date  . ADD (attention deficit disorder) 11/19/2012  . Allergic rhinitis, cause unspecified   . Anxiety state, unspecified   . Arthritis   . Asymptomatic postmenopausal status (age-related) (natural)   . Atypical hyperplasia of left breast 2000  . Benign neoplasm  of other and unspecified site of the digestive system   . Bipolar disorder ([place])   . Breast cancer ([place])    Left Breast 65m invasive CA left uiq, dcis left uoq and a lot ADH, ALH in both breasts.  .Marland KitchenCAD (coronary artery disease)    a. 08/2014 NSTEMI/Cath: LM nl, LAD 50p, D1/D2 min irregs, LCX nl, OM1 40, LPDA nl, RI 99ost (2.062mvessel->med Rx), RCA nl, AM 60, nl EF.  . Carcinoma of left breast ([place]  . Chronic diastolic CHF (congestive heart failure) ([place]   a. 08/2014 Echo: EF 60-65%, Gr 1 DD, nl LA.  . Marland Kitchenyst    on Achilles tendon  . Depression   . Diabetes mellitus ([place]   frank  .  Dysuria   . Edema   . Family history of malignant neoplasm of gastrointestinal tract   . Fibromyalgia   . GERD (gastroesophageal reflux disease)   . Glaucoma    pre glaucoma  . Headache    migraines  . Hiatal hernia   . History of alcoholism ([place])   . History of migraines   . History of ovarian cyst   . Insomnia, unspecified   . Myocardial infarction ([place]) 09-04-14  . OCD (obsessive compulsive disorder)   . Osteopenia 09/25/2016   Femoral neck T -1.1  9/18  . Other screening mammogram   . Pure hypercholesterolemia   . Rosacea   . Subacute confusional state 11/19/2012  . Tubular adenoma of colon 01/10/11  . Ulcerative colitis, unspecified   . Unspecified asthma(493.90)   . Unspecified essential hypertension   . Unspecified hypothyroidism   . Unspecified vitamin D deficiency   . Vertigo     Past Surgical History:  Procedure Laterality Date  . APPENDECTOMY    . AXILLARY SENTINEL NODE BIOPSY Left 05/23/2014   Procedure: AXILLARY SENTINEL NODE BIOPSY;  Surgeon: Christene Lye, MD;  Location: [facility] ORS;  Service: General;  Laterality: Left;  . BREAST BIOPSY  Aug 2000   on tamoxifen, atypical hyperplasia  . BREAST SURGERY Left Aug 2000   lumpectomy/ Dr Sharlet Salina  . BREAST SURGERY Bilateral 05/23/14   Mastectomy  . BUNIONECTOMY Right   . CARDIAC CATHETERIZATION N/A 09/05/2014    Procedure: Left Heart Cath and Coronary Angiography;  Surgeon: Wellington Hampshire, MD;  Location: [facility];  Service: Cardiovascular;  Laterality: N/A;  . CARDIAC CATHETERIZATION  09-05-14  . CATARACT EXTRACTION, BILATERAL    . CESAREAN SECTION  1996/1997   placenta previa, gest DM, pre-eclampsia  . cholecystect  1999  . CHOLECYSTECTOMY  1997   adhesions also  . COLONOSCOPY  01/2001   Ulcerative colitis  . COLONOSCOPY  04/2004   UC, polyp  . COLONOSCOPY  12/08   UC, no polyps  . DEXA  04/1999 and 2010   normal  . ESOPHAGOGASTRODUODENOSCOPY  09/2001   polyp  . exercise stress test  11/2003   negative  . FEMUR FRACTURE SURGERY Left   . MASTECTOMY Bilateral 05-23-14   Dr. Jamal Collin  . MECKEL DIVERTICULUM EXCISION  1999  . NASAL SINUS SURGERY  05/1997  . nuclear stress test  06/2007   negative  . precancerous mole removed    . SIMPLE MASTECTOMY WITH AXILLARY SENTINEL NODE BIOPSY Bilateral 05/23/2014   Procedure: Bilateral simple mastectomy, left sentinel node biopsy ;  Surgeon: Christene Lye, MD;  Location: [facility] ORS;  Service: General;  Laterality: Bilateral;  . Sleep study  11/09   no apnea, but did snore (done by [facility])  . TONSILLECTOMY  1984  . TUBAL LIGATION    . WISDOM TOOTH EXTRACTION  1980's    Family History  Problem Relation Age of Onset  . Coronary artery disease Father   . Colon cancer Father   . Alzheimer's disease Father   . Dementia Father   . Coronary artery disease Mother   . Hypertension Mother   . Osteoporosis Mother   . Colitis Mother   . Crohn's disease Mother   . Breast cancer Unknown        great aunts  . Coronary artery disease Unknown        Uncle (also AAA)  . Diabetes Unknown        remote family  history  . Stroke Cousin   . Esophageal cancer Neg Hx   . Rectal cancer Neg Hx   . Stomach cancer Neg Hx     Social History:  reports that she quit smoking about 24 years ago. Her smoking use included cigarettes. She has a 1.25  pack-year smoking history. She has quit using smokeless tobacco. She reports that she does not drink alcohol or use drugs.  REVIEW Of SYSTEMS:   HYPERCALCEMIA: Her calcium has been Persistently mildly increased, upper normal now PTH in the past has been borderline for hyperparathyroidism and is now below 20  Calcium supplements: Recently none She is taking OTC vitamin D3, 2000 units  Her level will need to be checked again  Bone density in 9/15 shows T score -1.1 at the hip  Lab Results  Component Value Date   PTH 18 10/25/2016   CALCIUM 9.6 02/28/2017   PHOS 2.3 01/29/2007     Lab Results  Component Value Date   CALCIUM 9.6 02/28/2017   PHOS 2.3 01/29/2007   Lab Results  Component Value Date   VD25OH 24.75 (L) 02/06/2017   VD25OH 44 05/12/2013   VD25OH 47 05/19/2012     HYPERTENSION: Blood pressure is controlled, managed by cardiologist.   Currently on low-dose clonidine, Bystolic twice a day and diltiazem    Lab Results  Component Value Date   CREATININE 0.90 02/28/2017   BUN 12 02/28/2017   NA 140 02/28/2017   K 3.9 02/28/2017   CL 106 02/28/2017   CO2 18 (L) 02/28/2017    History of hypercholesterolemia: Management by cardiologist and has been on 80 mg total since her MI LDL below 70  Lab Results  Component Value Date   CHOL 137 02/06/2017   HDL 41.90 02/06/2017   LDLCALC 69 02/06/2017   LDLDIRECT 67.0 11/10/2015   TRIG 128.0 02/06/2017   CHOLHDL 3 02/06/2017    Her gastroenterologist feels that ALT is high from fatty liver or colitis   Lab Results  Component Value Date   ALT 44 02/28/2017      Examination:   BP 122/72 (BP Location: Left Arm, Patient Position: Sitting, Cuff Size: Normal)   Pulse 85   Ht '5\' 4"'$  (1.626 m)   Wt 213 lb 3.2 oz (96.7 kg)   LMP 08/23/2006 (Approximate) Comment: tubal ligation  BMI 36.60 kg/m        Assessments   DIABETES:  See history of present illness for evaluation of  current management, blood  sugar patterns and problems identified  Her blood sugars are more difficult to control with her using Seroquel recently and needing increased insulin However previous 3 months they are still relatively well controlled with A1c 6.4  Currently not on mealtime insulin but on multiple other drugs including Ozempic 0.5 and Invokana She is not able to exercise and this is making it difficult  Her fasting readings are relatively high and on an average her postprandial readings are not much higher even though she has not been able to be consistent with diet and gaining weight  Discussed need for regular exercise but she is not able to do walking as yet She will need to increase Ozempic to 1 mg to see if this will help and she can try to injection of the 0.5 However if she has nausea she will let us know  Hypothyroidism, post ablative:  She is requiring progressively lower doses not clear why TSH is 0.32 using 112 g, 2 tablets  She will now reduce her levothyroxine dose to 200 mcg daily   HYPERCALCEMIA: Calcium is more consistently normal now  LIPIDS: LDL is just below 70 now  Depression: She is doing better but gaining weight from Seroquel which she apparently is going to continue  Patient Instructions  2 inj of Ozempic of 0.'5mg'$      Total visit time for evaluation and management of multiple problems, downloading and evaluation of glucose monitor, review of labs, medication review and counseling =25 minutes  .  Elayne Snare 03/09/2017, 9:15 PM   Note: This office note was prepared with Dragon voice recognition system technology. Any transcriptional errors that result from this process are unintentional.

## 2017-03-10 ENCOUNTER — Encounter: Payer: Self-pay | Admitting: Endocrinology

## 2017-03-11 ENCOUNTER — Other Ambulatory Visit: Payer: Self-pay | Admitting: Endocrinology

## 2017-03-11 MED ORDER — LEVOTHYROXINE SODIUM 200 MCG PO TABS: 200.0000 ug | ORAL_TABLET | Freq: Every day | ORAL | 3 refills | Status: DC

## 2017-03-12 ENCOUNTER — Other Ambulatory Visit: Payer: Self-pay | Admitting: Internal Medicine

## 2017-03-17 ENCOUNTER — Ambulatory Visit (INDEPENDENT_AMBULATORY_CARE_PROVIDER_SITE_OTHER): Payer: BLUE CROSS/BLUE SHIELD | Admitting: Psychology

## 2017-03-17 DIAGNOSIS — F3131 Bipolar disorder, current episode depressed, mild: Secondary | ICD-10-CM | POA: Diagnosis not present

## 2017-03-28 ENCOUNTER — Telehealth: Payer: Self-pay | Admitting: Radiology

## 2017-03-28 NOTE — Telephone Encounter (Signed)
Copied from [place] [id number]. Topic: Inquiry >> Mar 27, 2017  1:25 PM Scherrie Gerlach wrote: Reason for CRM: pt would like to know if OK for her to come there to your office and have labs drawn ordered by Dr Dwyane Dee? (pt wants 05/28/17) Labs are in [place].  >> Mar 27, 2017  2:24 PM Bare, Patriciaann Clan, CMA wrote: Sent to our office by mistake    LM for the patient, we can do your lab draw here for Dr Dwyane Dee.

## 2017-04-04 ENCOUNTER — Encounter: Payer: Self-pay | Admitting: Endocrinology

## 2017-04-07 ENCOUNTER — Ambulatory Visit: Payer: BLUE CROSS/BLUE SHIELD | Admitting: Psychology

## 2017-04-07 ENCOUNTER — Encounter: Payer: Self-pay | Admitting: Endocrinology

## 2017-04-08 ENCOUNTER — Other Ambulatory Visit: Payer: Self-pay | Admitting: Family Medicine

## 2017-04-08 NOTE — Telephone Encounter (Signed)
I refilled it  Please make a late spring f/u

## 2017-04-08 NOTE — Telephone Encounter (Signed)
Pt hasn't seen Dr. Glori Bickers in over a year but has had a few acute appts with other providers, please advise

## 2017-04-11 ENCOUNTER — Other Ambulatory Visit: Payer: Self-pay

## 2017-04-12 ENCOUNTER — Other Ambulatory Visit: Payer: Self-pay | Admitting: Internal Medicine

## 2017-04-13 ENCOUNTER — Other Ambulatory Visit: Payer: Self-pay | Admitting: Endocrinology

## 2017-04-13 MED ORDER — SEMAGLUTIDE (1 MG/DOSE) 2 MG/1.5ML ~~LOC~~ SOPN: 1.0000 mg | PEN_INJECTOR | SUBCUTANEOUS | 3 refills | Status: DC

## 2017-04-17 ENCOUNTER — Encounter: Payer: Self-pay | Admitting: Nurse Practitioner

## 2017-04-17 ENCOUNTER — Ambulatory Visit (INDEPENDENT_AMBULATORY_CARE_PROVIDER_SITE_OTHER): Payer: BLUE CROSS/BLUE SHIELD | Admitting: Nurse Practitioner

## 2017-04-17 ENCOUNTER — Telehealth: Payer: Self-pay

## 2017-04-17 VITALS — BP 120/64 | HR 80 | Ht 66.0 in | Wt 218.5 lb

## 2017-04-17 DIAGNOSIS — K51919 Ulcerative colitis, unspecified with unspecified complications: Secondary | ICD-10-CM

## 2017-04-17 DIAGNOSIS — Z7901 Long term (current) use of anticoagulants: Secondary | ICD-10-CM | POA: Diagnosis not present

## 2017-04-17 DIAGNOSIS — Z860100 Personal history of colon polyps, unspecified: Secondary | ICD-10-CM

## 2017-04-17 MED ORDER — NA SULFATE-K SULFATE-MG SULF 17.5-3.13-1.6 GM/177ML PO SOLN: 1.0000 | Freq: Once | ORAL | 0 refills | Status: AC

## 2017-04-17 NOTE — Progress Notes (Signed)
IMPRESSION and PLAN:    #60.  57-year-old female with long-standing ulcerative colitis, maintained on Lialda.  No diarrhea or blood in stool.  In fact, patient has tended towards constipation recently.  She is managing the constipation well with Linzess and MiraLAX    #2.  History of multiple adenomatous polyps, she is due for surveillance colonoscopy now.    -The risks and benefits of colonoscopy with possible polypectomy were discussed and the patient agrees to proceed.   #3.  CAD, on Plavix.  -Hold plavix for 5 days before procedure - will instruct when and how to resume after procedure. Patient understands that there is a low but real risk of cardiovascular event such as heart attack, stroke, embolism, thrombosis or ischemia/infarct while off plavix. The patient consents to proceed. Will communicate by phone or EMR with patient's prescribing provider to confirm that holding plavix is reasonable in this case.    HPI:    Chief Complaint: surveillance colonoscopy   Lindsay Fry is a 57 y.o. female with multiple significant medical including a longstanding history of ulcerative colitis  She is followed by Dr. Henrene Pastor and is maintained only Lialda. Lindsay Fry has a history of multiple adenomatous colon polyps, she is due for surveillance colonoscopy.  No GI complaints except for mild constipation which she has remedied with daily MiraLAX and Linzess.  She has gained 8 pounds over the last 2 months.   No general medical complaints.  Patient is on Plavix for history of coronary artery disease.  She has no chest pains or shortness of breath.  Wt Readings from Last 3 Encounters:  04/17/17 218 lb 8 oz (99.1 kg)  03/07/17 213 lb 3.2 oz (96.7 kg)  02/28/17 210 lb (95.3 kg)    Review of systems:   No chest pain, no sob, no urinary symptoms  Past Medical History:  Diagnosis Date  . ADD (attention deficit disorder) 11/19/2012  . Allergic rhinitis, cause unspecified   . Anxiety state, unspecified   .  Arthritis   . Asymptomatic postmenopausal status (age-related) (natural)   . Atypical hyperplasia of left breast 2000  . Benign neoplasm of other and unspecified site of the digestive system   . Bipolar disorder ([place])   . Breast cancer ([place])    Left Breast 42m invasive CA left uiq, dcis left uoq and a lot ADH, ALH in both breasts.  .Marland KitchenCAD (coronary artery disease)    a. 08/2014 NSTEMI/Cath: LM nl, LAD 50p, D1/D2 min irregs, LCX nl, OM1 40, LPDA nl, RI 99ost (2.054mvessel->med Rx), RCA nl, AM 60, nl EF.  . [place] breast ([facility]  . Chronic diastolic CHF (congestive heart failure) ([place]   a. 08/2014 Echo: EF 60-65%, Gr 1 DD, nl LA.  . Marland Kitchenyst    on Achilles tendon  . Depression   . Diabetes mellitus ([facility]   frank  . Dysuria   . Edema   . Family history of malignant neoplasm of gastrointestinal tract   . Fibromyalgia   . GERD (gastroesophageal reflux disease)   . Glaucoma    pre glaucoma  . Headache    migraines  . Hiatal hernia   . History of alcoholism ([facility]  . History of migraines   . History of ovarian cyst   . Insomnia, unspecified   . Myocardial infarction ([facility]16  . OCD (obsessive compulsive disorder)   . Osteopenia 09/25/2016   Femoral neck T -1.1  9/18  .  Other screening mammogram   . Pure hypercholesterolemia   . Rosacea   . Subacute confusional state 11/19/2012  . Tubular adenoma of colon 01/10/11  . Ulcerative colitis, unspecified   . Unspecified asthma(493.90)   . Unspecified essential hypertension   . Unspecified hypothyroidism   . Unspecified vitamin D deficiency   . Vertigo     Patient's surgical history, family medical history, social history, medications and allergies were all reviewed in Epic    Physical Exam:     BP 120/64 (BP Location: Right Arm, Patient Position: Sitting, Cuff Size: Normal)   Pulse 80   Ht 5' 6"  (1.676 m)   Wt 218 lb 8 oz (99.1 kg)   LMP 08/23/2006 (Approximate) Comment: tubal ligation  BMI 35.27 kg/m     GENERAL:  White female in NAD PSYCH: :Pleasant, cooperative, normal affect EENT:  conjunctiva pink, mucous membranes moist, neck supple without masses CARDIAC:  RRR, no murmur heard, no peripheral edema PULM: Normal respiratory effort, lungs CTA bilaterally, no wheezing ABDOMEN:  proturberant but soft, nontender. No obvious masses, normal bowel sounds SKIN:  turgor, no lesions seen Musculoskeletal:  Normal muscle tone, normal strength NEURO: Alert and oriented x 3, no focal neurologic deficits  Lindsay Fry , NP 04/17/2017, 9:23 AM

## 2017-04-17 NOTE — Progress Notes (Signed)
Assessment and plan reviewed 

## 2017-04-17 NOTE — Patient Instructions (Addendum)
If you are age 59 or older, your body mass index should be between 23-30. Your Body mass index is 35.27 kg/m. If this is out of the aforementioned range listed, please consider follow up with your Primary Care Provider.  If you are age 14 or younger, your body mass index should be between 19-25. Your Body mass index is 35.27 kg/m. If this is out of the aformentioned range listed, please consider follow up with your Primary Care Provider.   You have been scheduled for a colonoscopy. Please follow written instructions given to you at your visit today.  Please pick up your prep supplies at the pharmacy within the next 1-3 days. If you use inhalers (even only as needed), please bring them with you on the day of your procedure. Your physician has requested that you go to www.startemmi.com and enter the access code given to you at your visit today. This web site gives a general overview about your procedure. However, you should still follow specific instructions given to you by our office regarding your preparation for the procedure.  You will be contacted by our office prior to your procedure for directions on holding your Plavix.  If you do not hear from our office 1 week prior to your scheduled procedure, please call [phone] to discuss.   We have sent the following medications to your pharmacy for you to pick up at your convenience: SuPrep  Please purchase the following medications over the counter and take as directed: Miralax Ducolax  Thank you for choosing [facility], Tye Savoy, NP

## 2017-04-17 NOTE — Telephone Encounter (Signed)
[facility] Pre-operative Risk Assessment     Request for surgical clearance:     Endoscopy Procedure  What type of surgery is being performed?     Colonoscopy  When is this surgery scheduled?     06/04/2017 at 2:30pm  What type of clearance is required ?   Pharmacy  Are there any medications that need to be held prior to surgery and how long? Plavix for 5 days  Practice name and name of physician performing surgery?      [facility]  What is your office phone and fax number?      Phone- [phone]  [phone]  Anesthesia type (None, local, MAC, general) ?       MAC

## 2017-04-18 NOTE — Telephone Encounter (Signed)
   Primary Cardiologist:Timothy Rockey Situ, MD  Chart reviewed as part of pre-operative protocol coverage. Because of [place] past medical history and time since last visit, he/she will require a follow-up visit in order to better assess preoperative cardiovascular risk.  Pre-op covering staff: - Please schedule appointment and call patient to inform them. - Please contact requesting surgeon's office via preferred method (i.e, phone, fax) to inform them of need for appointment prior to surgery.  Rosaria Ferries, PA-C  04/18/2017, 5:20 PM

## 2017-04-21 NOTE — Telephone Encounter (Signed)
Faxed via epic to requesting party

## 2017-04-21 NOTE — Telephone Encounter (Signed)
Forwarded to requesting providers office via [facility]

## 2017-04-21 NOTE — Telephone Encounter (Signed)
Called Patient .  Left detailed message for patient (DPR) to call back for appt

## 2017-04-22 ENCOUNTER — Ambulatory Visit (INDEPENDENT_AMBULATORY_CARE_PROVIDER_SITE_OTHER): Payer: BLUE CROSS/BLUE SHIELD | Admitting: Family Medicine

## 2017-04-22 ENCOUNTER — Encounter: Payer: Self-pay | Admitting: Family Medicine

## 2017-04-22 VITALS — BP 116/72 | HR 86 | Temp 98.4°F | Ht 64.0 in | Wt 217.2 lb

## 2017-04-22 DIAGNOSIS — I739 Peripheral vascular disease, unspecified: Secondary | ICD-10-CM | POA: Diagnosis not present

## 2017-04-22 DIAGNOSIS — F988 Other specified behavioral and emotional disorders with onset usually occurring in childhood and adolescence: Secondary | ICD-10-CM

## 2017-04-22 DIAGNOSIS — Z6836 Body mass index (BMI) 36.0-36.9, adult: Secondary | ICD-10-CM | POA: Diagnosis not present

## 2017-04-22 DIAGNOSIS — I10 Essential (primary) hypertension: Secondary | ICD-10-CM | POA: Diagnosis not present

## 2017-04-22 DIAGNOSIS — E1159 Type 2 diabetes mellitus with other circulatory complications: Secondary | ICD-10-CM

## 2017-04-22 DIAGNOSIS — B009 Herpesviral infection, unspecified: Secondary | ICD-10-CM | POA: Diagnosis not present

## 2017-04-22 DIAGNOSIS — E7800 Pure hypercholesterolemia, unspecified: Secondary | ICD-10-CM | POA: Diagnosis not present

## 2017-04-22 DIAGNOSIS — K51919 Ulcerative colitis, unspecified with unspecified complications: Secondary | ICD-10-CM

## 2017-04-22 DIAGNOSIS — I471 Supraventricular tachycardia, unspecified: Secondary | ICD-10-CM | POA: Insufficient documentation

## 2017-04-22 DIAGNOSIS — F3130 Bipolar disorder, current episode depressed, mild or moderate severity, unspecified: Secondary | ICD-10-CM | POA: Diagnosis not present

## 2017-04-22 DIAGNOSIS — E559 Vitamin D deficiency, unspecified: Secondary | ICD-10-CM | POA: Diagnosis not present

## 2017-04-22 MED ORDER — GABAPENTIN 300 MG PO CAPS: ORAL_CAPSULE | ORAL | 11 refills | Status: DC

## 2017-04-22 MED ORDER — VALACYCLOVIR HCL 500 MG PO TABS: ORAL_TABLET | ORAL | 11 refills | Status: DC

## 2017-04-22 NOTE — Patient Instructions (Addendum)
Get back to the pool ! Be as active as you can Also get outdoors  Try to wean yourself off of sweet tea   Try valtrex once daily instead of twice daily   Keep taking vitamin D  Take care of yourself

## 2017-04-22 NOTE — Progress Notes (Signed)
Subjective:    Patient ID: Lindsay Fry, female    DOB: 04/21/1960, 57 y.o.   MRN: [id number]  HPI Here for fu of chronic health problems   Struggling with bipolar - depression is worse Dr Lajoyce Corners put her on seroquel and also sonata for sleep  Also 40 mg of vyvanse for attention and motivation  Starting to feel better   Doing well with breast cancer f/u every 6 mo   Had first exam with new gyn Dr Dyke Maes recently-had [place] to look for ov cancer (fam hx)  Still takes valtrex bid for suppression  Willing to try it once daily    Wt Readings from Last 3 Encounters:  04/22/17 217 lb 4 oz (98.5 kg)  04/17/17 218 lb 8 oz (99.1 kg)  03/07/17 213 lb 3.2 oz (96.7 kg)  gained a little bit and then leveled out  Pool was closed- now opening in April -plans to go back (no other options)  Still drinks 2 cups of sweet tea  37.29 kg/m   bp is stable today  No cp or palpitations or headaches or edema  No side effects to medicines  BP Readings from Last 3 Encounters:  04/22/17 116/72  04/17/17 120/64  03/07/17 122/72    CAD and hx of SVT in the past  Doing well  No rapid HR or CP Sees cardiologist - will go for clearance before her colonoscopy - (for UC)   No more syncope Had a few episodes of dizziness followed by falling asleep  Fine now     Sees Dr Dwyane Dee for DM2 and hypothyroid Lab Results  Component Value Date   HGBA1C 6.4 02/06/2017   Lab Results  Component Value Date   TSH 0.32 (L) 02/06/2017   adjusted her levothy- pending a re check   Hyperlipidemia Lab Results  Component Value Date   CHOL 137 02/06/2017   HDL 41.90 02/06/2017   LDLCALC 69 02/06/2017   LDLDIRECT 67.0 11/10/2015   TRIG 128.0 02/06/2017   CHOLHDL 3 02/06/2017  lipitor and diet  Good control overall  Needs to exercise more /does take krill oil   Diet is fair  Cheats "sometimes"  Hard to motivate her   Vit D 24.7--now on 4000 iu plus ca plus D  Patient Active Problem List   Diagnosis Date Noted  . Claudication ([place]) 04/22/2017  . Supraventricular tachycardia ([place]) 04/22/2017  . Vitamin D deficiency 02/21/2017  . Osteopenia 09/25/2016  . Estrogen deficiency 05/30/2016  . Need for hepatitis C screening test 03/19/2016  . Screening for HIV (human immunodeficiency virus) 03/19/2016  . Dysuria 03/19/2016  . Achilles bursitis 03/14/2016  . Carcinoma of overlapping sites of left breast in female, estrogen receptor positive ([place]) 08/21/2015  . Cough 03/20/2015  . Chronic constipation 01/18/2015  . NSTEMI (non-ST elevated myocardial infarction) ([place]) 09/06/2014  . CAD (coronary artery disease) 09/06/2014  . DM (diabetes mellitus) ([place]) 09/05/2014  . 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 Carcinoma of left breast ([place]) 06/02/2014  . Sinus tachycardia 04/27/2014  . Obesity 05/20/2013  . Bladder pain 02/17/2013  . ADD (attention deficit disorder) 11/19/2012  . Urinary frequency 08/05/2012  . Low back pain 08/05/2012  . 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 Chronic sinusitis 01/24/2012  . Vertigo, benign positional 01/24/2012  . Other screening mammogram 12/05/2010  . Routine general medical examination at a health care facility 10/14/2010  . FATIGUE 08/09/2009  . POSTMENOPAUSAL STATUS 06/22/2008  . UNSPECIFIED VITAMIN D  DEFICIENCY 03/18/2008  . BENIGN NEOPLASM [place] SITE DIGESTIVE SYSTEM 01/09/2007  . HIATAL HERNIA 01/09/2007  . COLONIC POLYPS, ADENOMATOUS, HX OF 01/09/2007  . HYPERCHOLESTEROLEMIA 01/07/2007  . Bipolar disorder, unspecified ([place]) 01/07/2007  . Bipolar I disorder, most recent episode depressed ([place]) 01/07/2007  . Essential hypertension 01/07/2007  . ALLERGIC RHINITIS 01/07/2007  . ASTHMA 01/07/2007  . GERD 01/07/2007  . Ulcerative colitis ([place]) 01/07/2007  . ACNE ROSACEA 01/07/2007  . MIGRAINES, HX OF 01/07/2007  . Fibromyalgia 10/30/2006  . INSOMNIA 10/30/2006   Past Medical History:  Diagnosis Date  . ADD  (attention deficit disorder) 11/19/2012  . Allergic rhinitis, cause unspecified   . Anxiety state, unspecified   . Arthritis   . Asymptomatic postmenopausal status (age-related) (natural)   . Atypical hyperplasia of left breast 2000  . Benign neoplasm of other and unspecified site of the digestive system   . Bipolar disorder ([place])   . Breast cancer ([place])    Left Breast 36m invasive CA left uiq, dcis left uoq and a lot ADH, ALH in both breasts.  .Marland KitchenCAD (coronary artery disease)    a. 08/2014 NSTEMI/Cath: LM nl, LAD 50p, D1/D2 min irregs, LCX nl, OM1 40, LPDA nl, RI 99ost (2.031mvessel->med Rx), RCA nl, AM 60, nl EF.  . Carcinoma of left breast (HCEspy5/19/2016  . Chronic diastolic CHF (congestive heart failure) (HC[place]as[place]   a. 08/2014 Echo: EF 60-65%, Gr 1 DD, nl LA.  . Marland Kitchenyst    on Achilles tendon  . Depression   . Diabetes mellitus (HCE[place]   frank  . Dysuria   . Edema   . Family history of malignant neoplasm of gastrointestinal tract   . Fibromyalgia   . GERD (gastroesophageal reflux disease)   . Glaucoma    pre glaucoma  . Headache    migraines  . Hiatal hernia   . History of alcoholism ([place]  . History of migraines   . History of ovarian cyst   . Insomnia, unspecified   . Myocardial infarction ([place]CT[place]8-21-16  . OCD (obsessive compulsive disorder)   . Osteopenia 09/25/2016   Femoral neck T -1.1  9/18  . Other screening mammogram   . Pure hypercholesterolemia   . Rosacea   . Subacute confusional state 11/19/2012  . Tubular adenoma of colon 01/10/11  . Ulcerative colitis, unspecified   . Unspecified asthma(493.90)   . Unspecified essential hypertension   . Unspecified hypothyroidism   . Unspecified vitamin D deficiency   . Vertigo    Past Surgical History:  Procedure Laterality Date  . APPENDECTOMY    . AXILLARY SENTINEL NODE BIOPSY Left 05/23/2014   Procedure: AXILLARY SENTINEL NODE BIOPSY;  Surgeon: SeChristene LyeMD;  Location: [facility] ORS;  Service: General;   Laterality: Left;  . BREAST BIOPSY  Aug 2000   on tamoxifen, atypical hyperplasia  . BREAST SURGERY Left Aug 2000   lumpectomy/ Dr CrSharlet Salina. BREAST SURGERY Bilateral 05/23/14   Mastectomy  . BUNIONECTOMY Right   . CARDIAC CATHETERIZATION N/A 09/05/2014   Procedure: Left Heart Cath and Coronary Angiography;  Surgeon: MuWellington HampshireMD;  [facility];  Service: Cardiovascular;  Laterality: N/A;  . CARDIAC CATHETERIZATION  09-05-14  . CATARACT EXTRACTION, BILATERAL    . CESAREAN SECTION  1996/1997   placenta previa, gest DM, pre-eclampsia  . cholecystect  1999  . CHOLECYSTECTOMY  1997   adhesions also  . COLONOSCOPY  01/2001   Ulcerative colitis  .  COLONOSCOPY  04/2004   UC, polyp  . COLONOSCOPY  12/08   UC, no polyps  . DEXA  04/1999 and 2010   normal  . ESOPHAGOGASTRODUODENOSCOPY  09/2001   polyp  . exercise stress test  11/2003   negative  . FEMUR FRACTURE SURGERY Left   . MASTECTOMY Bilateral 05-23-14   Dr. Jamal Collin  . MECKEL DIVERTICULUM EXCISION  1999  . NASAL SINUS SURGERY  05/1997  . nuclear stress test  06/2007   negative  . precancerous mole removed    . SIMPLE MASTECTOMY WITH AXILLARY SENTINEL NODE BIOPSY Bilateral 05/23/2014   Procedure: Bilateral simple mastectomy, left sentinel node biopsy ;  Surgeon: Christene Lye, MD;  Location: [facility] ORS;  Service: General;  Laterality: Bilateral;  . Sleep study  11/09   no apnea, but did snore (done by [facility])  . TONSILLECTOMY  1984  . TUBAL LIGATION    . WISDOM TOOTH EXTRACTION  1980's   Social History   Tobacco Use  . Smoking status: Former Smoker    Packs/day: 0.25    Years: 5.00    Pack years: 1.25    Types: Cigarettes    Last attempt to quit: 01/14/1993    Years since quitting: 24.2  . Smokeless tobacco: Former Network engineer Use Topics  . Alcohol use: No    Alcohol/week: 0.0 oz    Comment: Recovered ETOH  . Drug use: No   Family History  Problem Relation Age of Onset  . Coronary  artery disease Father   . Colon cancer Father   . Alzheimer's disease Father   . Dementia Father   . Coronary artery disease Mother   . Hypertension Mother   . Osteoporosis Mother   . Colitis Mother   . Crohn's disease Mother   . Breast cancer Unknown        great aunts  . Coronary artery disease Unknown        Uncle (also AAA)  . Diabetes Unknown        remote family history  . Stroke Cousin   . Esophageal cancer Neg Hx   . Rectal cancer Neg Hx   . Stomach cancer Neg Hx    Allergies  Allergen Reactions  . Ephedrine Other (See Comments)    Pt becomes hyper  . Aspartame And Phenylalanine Nausea Only  . Aspirin     REACTION: aggrivates colitis  . Crestor [Rosuvastatin]     REACTION: increased lfts  . Erythromycin     REACTION: GI upset  . Nsaids     REACTION: aggrivate colitis  . Other Other (See Comments)    Unable to take due to history of ulcerative colitis  . Oxycodone Other (See Comments)    Panic Attack   Current Outpatient Medications on File Prior to Visit  Medication Sig Dispense Refill  . amantadine (SYMMETREL) 100 MG capsule Take 100 mg by mouth 2 (two) times daily.    Marland Kitchen atorvastatin (LIPITOR) 80 MG tablet TAKE 1 TABLET(80 MG) BY MOUTH DAILY 90 tablet 3  . b complex vitamins capsule Take 1 capsule by mouth daily.    . B-D UF III MINI PEN NEEDLES 31G X 5 MM MISC USE FOR INJECTIONS TWICE DAILY 100 each 0  . BAYER MICROLET LANCETS lancets USE AS DIRECTED TO CHECK BLOOD SUGAR TWICE DAILY 200 each 2  . Blood Glucose Monitoring Suppl (CONTOUR NEXT EZ MONITOR) w/Device KIT USE AS DIRECTED TO CHECK BLOOD SUGAR TWICE DAILY 1  kit 2  . BYSTOLIC 10 MG tablet TAKE 1 TABLET BY MOUTH IN THE MORNING AND 2 TABLETS AFTER LUNCH 270 tablet 3  . Calcium Carb-Cholecalciferol (CALCIUM PLUS VITAMIN D3) 600-800 MG-UNIT TABS Take 1 tablet by mouth daily.    Marland Kitchen CARTIA XT 240 MG 24 hr capsule TAKE 1 CAPSULE(240 MG) BY MOUTH DAILY 90 capsule 3  . chlorproMAZINE (THORAZINE) 25 MG tablet  Take 25-50 mg by mouth as needed. Reported on 03/02/2015    . cloNIDine (CATAPRES) 0.1 MG tablet TAKE 1 TABLET(0.1 MG) BY MOUTH TWICE DAILY 180 tablet 3  . clopidogrel (PLAVIX) 75 MG tablet Take 1 tablet (75 mg total) by mouth daily. 90 tablet 3  . co-enzyme Q-10 50 MG capsule Take 50 mg by mouth daily.    . diazepam (VALIUM) 10 MG tablet Take 2.5-10 mg by mouth as needed for anxiety.    . diphenoxylate-atropine (LOMOTIL) 2.5-0.025 MG tablet Take 1-2 tablets by mouth 3 times a day as needed for diarrhea 90 tablet 0  . FIBER SELECT GUMMIES PO Take 3 capsules by mouth daily.    Marland Kitchen glucose blood (BAYER CONTOUR NEXT TEST) test strip USE AS DIRECTED TO CHECK BLOOD SUGAR TWICE DAILY 200 each 2  . Insulin Detemir (LEVEMIR FLEXTOUCH) 100 UNIT/ML Pen INJECT 48 UNITS UNDER THE SKIN DAILY AT 10 PM 45 mL 1  . INVOKANA 300 MG TABS tablet TAKE 1 TABLET(300 MG) BY MOUTH DAILY BEFORE BREAKFAST 30 tablet 3  . lamoTRIgine (LAMICTAL) 200 MG tablet Take 400 mg by mouth daily.    Marland Kitchen latanoprost (XALATAN) 0.005 % ophthalmic solution Place 1 drop into both eyes At bedtime.    Marland Kitchen letrozole (FEMARA) 2.5 MG tablet Take 1 tablet (2.5 mg total) by mouth daily. 30 tablet 12  . levothyroxine (SYNTHROID) 200 MCG tablet Take 1 tablet (200 mcg total) by mouth daily before breakfast. 30 tablet 3  . LINZESS 145 MCG CAPS capsule TAKE 1 CAPSULE(145 MCG) BY MOUTH DAILY BEFORE BREAKFAST 30 capsule 1  . lisdexamfetamine (VYVANSE) 40 MG capsule Take 40 mg by mouth every morning.    . lithium carbonate (ESKALITH) 450 MG CR tablet Take 900 mg by mouth at bedtime.    . Melatonin 10 MG CAPS Take 1 capsule by mouth at bedtime.    . mesalamine (LIALDA) 1.2 g EC tablet Take 2 tablets (2.4 g total) by mouth daily. 60 tablet 11  . Multiple Vitamin (MULTIVITAMIN) tablet Take 1 tablet by mouth daily.      . nitrofurantoin (MACRODANTIN) 100 MG capsule Take 100 mg by mouth as directed. Reported on 03/02/2015    . Omega-3 Krill Oil 500 MG CAPS Take 500  mg by mouth.    . ondansetron (ZOFRAN ODT) 4 MG disintegrating tablet Take 1 each morning then every 6 hours for nausea. 120 tablet 2  . pantoprazole (PROTONIX) 40 MG tablet TAKE 1 TABLET BY MOUTH TWICE DAILY 60 tablet 6  . Polyethylene Glycol 3350 (MIRALAX PO) Take by mouth daily as needed.    . Probiotic Product (PROBIOTIC DAILY PO) Take by mouth.    . QUEtiapine (SEROQUEL XR) 400 MG 24 hr tablet Take 1 tablet by mouth at bedtime.  1  . ranitidine (ZANTAC) 300 MG tablet Take 0.5 tablets (150 mg total) by mouth 2 (two) times daily. 60 tablet 3  . Semaglutide (OZEMPIC) 1 MG/DOSE SOPN Inject 1 mg into the skin once a week. 2 pen 3  . Simethicone (GAS-X PO) Take 1 tablet by mouth  4 (four) times daily.    . vitamin C (ASCORBIC ACID) 500 MG tablet Take 250 mg by mouth daily. Takes 2 a day    . zaleplon (SONATA) 10 MG capsule Take 20 mg by mouth at bedtime as needed for sleep.     Current Facility-Administered Medications on File Prior to Visit  Medication Dose Route Frequency Provider Last Rate Last Dose  . 0.9 %  sodium chloride infusion  500 mL Intravenous Continuous Irene Shipper, MD        Review of Systems  Constitutional: Positive for fatigue. Negative for activity change, appetite change, fever and unexpected weight change.  HENT: Positive for postnasal drip and rhinorrhea. Negative for congestion, ear pain, sinus pressure and sore throat.   Eyes: Negative for pain, redness and visual disturbance.  Respiratory: Negative for cough, shortness of breath and wheezing.   Cardiovascular: Negative for chest pain and palpitations.  Gastrointestinal: Positive for constipation. Negative for abdominal pain, blood in stool and diarrhea.  Endocrine: Negative for polydipsia and polyuria.  Genitourinary: Negative for dysuria, frequency and urgency.  Musculoskeletal: Positive for arthralgias and back pain. Negative for joint swelling and myalgias.  Skin: Negative for pallor and rash.    Allergic/Immunologic: Negative for environmental allergies.  Neurological: Negative for dizziness, syncope and headaches.  Hematological: Negative for adenopathy. Does not bruise/bleed easily.  Psychiatric/Behavioral: Positive for decreased concentration, dysphoric mood and sleep disturbance. Negative for self-injury and suicidal ideas. The patient is nervous/anxious.        Objective:   Physical Exam  Constitutional: She appears well-developed and well-nourished. No distress.  obese and well appearing   HENT:  Head: Normocephalic and atraumatic.  Mouth/Throat: Oropharynx is clear and moist.  Eyes: Pupils are equal, round, and reactive to light. Conjunctivae and EOM are normal.  Neck: Normal range of motion. Neck supple. No JVD present. Carotid bruit is not present. No thyromegaly present.  Cardiovascular: Normal rate, regular rhythm, normal heart sounds and intact distal pulses. Exam reveals no gallop.  Pulmonary/Chest: Effort normal and breath sounds normal. No respiratory distress. She has no wheezes. She has no rales.  No crackles  Abdominal: Soft. Bowel sounds are normal. She exhibits no distension, no abdominal bruit and no mass. There is no tenderness.  Musculoskeletal: She exhibits no edema.  Lymphadenopathy:    She has no cervical adenopathy.  Neurological: She is alert. She has normal reflexes.  Skin: Skin is warm and dry. No rash noted. No pallor.  Psychiatric: Her speech is normal and behavior is normal. Thought content normal. Her affect is blunt. Thought content is not paranoid. Cognition and memory are normal. She expresses no homicidal and no suicidal ideation.  Attentive  Affect is slt down/ blunted today Pleasant           Assessment & Plan:   Problem List Items Addressed This Visit      Cardiovascular and Mediastinum   Essential hypertension - Primary    bp in fair control at this time  BP Readings from Last 1 Encounters:  04/22/17 116/72   No changes  needed Disc lifstyle change with low sodium diet and exercise  Wt loss enc  Labs rev from jan      Supraventricular tachycardia [facility])    Not currently active  Sees cardiology        Digestive   Ulcerative colitis [facility])    For f/u colonoscopy soon after cardiac clearance Does these yearly        Endocrine  DM (diabetes mellitus) ([place]) (Chronic)    Sees Dr Dwyane Dee and doing well Lab Results  Component Value Date   HGBA1C 6.4 02/06/2017   Eye and foot care discussed        Other   ADD (attention deficit disorder) (Chronic)    Sees psychiatry/ Dr Lajoyce Corners On vyvanse which is helping      Bipolar I disorder, most recent episode depressed ([place])    Ongoing f/u with Dr Lajoyce Corners Now on seroquel which is helpful but making it hard to loose wt       RESOLVED: Claudication ([place])   HSV-2 infection    Continues prophylaxis with valtrex 500  Will try cutting to once daily instead of twice  Has needed long term therapy      Relevant Medications   valACYclovir (VALTREX) 500 MG tablet   HYPERCHOLESTEROLEMIA    Disc goals for lipids and reasons to control them Rev labs with pt Rev low sat fat diet in detail  Controlled with statin and diet  Enc exercise to help inc HDL      Obesity   Vitamin D deficiency    Level of 24.7 Not a lot of outdoor time  Now on 4000 iu D3 plus her ca/d combo

## 2017-04-23 ENCOUNTER — Encounter: Payer: Self-pay | Admitting: Family Medicine

## 2017-04-23 ENCOUNTER — Other Ambulatory Visit: Payer: Self-pay | Admitting: Endocrinology

## 2017-04-23 DIAGNOSIS — B009 Herpesviral infection, unspecified: Secondary | ICD-10-CM | POA: Insufficient documentation

## 2017-04-23 NOTE — Assessment & Plan Note (Signed)
For f/u colonoscopy soon after cardiac clearance Does these yearly

## 2017-04-23 NOTE — Telephone Encounter (Signed)
Pt has appt to see Dr. Rockey Situ on 05/05/17. I will route clearance to Dr. Rockey Situ so that he may have surgery information at appt time.

## 2017-04-23 NOTE — Assessment & Plan Note (Signed)
Continues prophylaxis with valtrex 500  Will try cutting to once daily instead of twice  Has needed long term therapy

## 2017-04-23 NOTE — Assessment & Plan Note (Signed)
Level of 24.7 Not a lot of outdoor time  Now on 4000 iu D3 plus her ca/d combo

## 2017-04-23 NOTE — Assessment & Plan Note (Signed)
Sees Dr Dwyane Dee Lab Results  Component Value Date   TSH 0.32 (L) 02/06/2017   Dose was adjusted  Pending f/u

## 2017-04-23 NOTE — Assessment & Plan Note (Signed)
Not currently active  [facility] cardiology

## 2017-04-23 NOTE — Assessment & Plan Note (Signed)
bp in fair control at this time  BP Readings from Last 1 Encounters:  04/22/17 116/72   No changes needed Disc lifstyle change with low sodium diet and exercise  Wt loss enc  Labs rev from [place]

## 2017-04-23 NOTE — Assessment & Plan Note (Signed)
Disc goals for lipids and reasons to control them Rev labs with pt Rev low sat fat diet in detail  Controlled with statin and diet  Enc exercise to help inc HDL

## 2017-04-23 NOTE — Assessment & Plan Note (Signed)
Sees Dr Dwyane Dee and doing well Lab Results  Component Value Date   HGBA1C 6.4 02/06/2017   Eye and foot care discussed

## 2017-04-23 NOTE — Assessment & Plan Note (Signed)
Ongoing f/u with Dr Lajoyce Corners Now on seroquel which is helpful but making it hard to loose wt

## 2017-04-23 NOTE — Assessment & Plan Note (Signed)
Sees psychiatry/ Dr Lajoyce Corners On vyvanse which is helping

## 2017-04-24 NOTE — Telephone Encounter (Signed)
Acceptable risk for surgery Hold plavix 5 days before colo and would only restart when GI approves Thx TGollan

## 2017-04-25 ENCOUNTER — Telehealth: Payer: Self-pay

## 2017-04-25 DIAGNOSIS — Z9013 Acquired absence of bilateral breasts and nipples: Secondary | ICD-10-CM | POA: Diagnosis not present

## 2017-04-25 DIAGNOSIS — C50112 Malignant neoplasm of central portion of left female breast: Secondary | ICD-10-CM | POA: Diagnosis not present

## 2017-04-25 DIAGNOSIS — C50111 Malignant neoplasm of central portion of right female breast: Secondary | ICD-10-CM | POA: Diagnosis not present

## 2017-04-25 NOTE — Telephone Encounter (Signed)
Notified patient to stop taking her Plavix 5 days prior to her procedure and to restart based off of Dr. Blanch Media post colonoscopy orders. She was thankful for the call and verbally confirmed understanding.

## 2017-04-25 NOTE — Telephone Encounter (Signed)
Clearance routed to number listed.

## 2017-04-28 ENCOUNTER — Ambulatory Visit (INDEPENDENT_AMBULATORY_CARE_PROVIDER_SITE_OTHER): Payer: BLUE CROSS/BLUE SHIELD | Admitting: Psychology

## 2017-04-28 DIAGNOSIS — F3131 Bipolar disorder, current episode depressed, mild: Secondary | ICD-10-CM

## 2017-05-02 NOTE — Progress Notes (Deleted)
Cardiology Office Note  Date:  05/02/2017   ID:  Lindsay Fry, Lindsay Fry [birth year], MRN [id number]  PCP:  Abner Greenspan, MD   No chief complaint on file.   HPI:  [name] is a very pleasant 57 year old woman with history of   Morbid obesity,  CAD Severe ostial ramus disease treated medically by catheterization August 2016, hypertension,  hyperlipidemia,   previous symptoms of chest pain and shortness of breath.  Migraines Coronary artery disease Chronic tachycardia She presents today for follow-up of her coronary artery disease, hypertension, hyperlipidemia,   In follow-up today she reports that she has drastically changed her diet Lost weight, 15 pounds over the past 2 months Cut off her sweets and soda Still drinks sweet tea first thing in the morning to wake up No regular exercise program.  Previously did water aerobics Chronic shortness of breat,h mild  Brilinta is very expensive for her.   She stopped aspirin on her own Sedentary at baseline  Denies any episodes of tachycardia or palpitations  Lab work reviewed with her HBA1C 6.4 Elevated LFTS   EKG on today's visit shows normal sinus rhythm with rate 70 bpm, new T wave abnormality through the anterior precordial leads, lead III and aVF. T wave abnormality was present during unstable angina symptoms August 2016 and then resolved, had upright T waves  Other past medical history Previously had left arm pain coming on at rest. This seemed to resolve with NSAIDs. Also with several episodes of higher blood pressure, possibly associated with stress. He states that she has been taking clonidine once a day in the evening, amlodipine in the morning  Vertigo symptoms occurred in early 2014 and recurrent August 9. She is taking meclizine. She has dizziness and feels that she is drunk when she stands up.  History of hyperlipidemia. In June 2010, total cholesterol 314.  Remote smoking for a short period of time, none  recently.  PMH:   has a past medical history of ADD (attention deficit disorder) (11/19/2012), Allergic rhinitis, cause unspecified, Anxiety state, unspecified, Arthritis, Asymptomatic postmenopausal status (age-related) (natural), Atypical hyperplasia of left breast (2000), Benign neoplasm of other and unspecified site of the digestive system, Bipolar disorder ([place]), Breast cancer ([place]), CAD (coronary artery disease), Carcinoma of left breast ([place]) (06/02/2014), Chronic diastolic CHF (congestive heart failure) ([place]), Cyst, Depression, Diabetes mellitus ([place]), Dysuria, Edema, Family history of malignant neoplasm of gastrointestinal tract, Fibromyalgia, GERD (gastroesophageal reflux disease), Glaucoma, Headache, Hiatal hernia, History of alcoholism ([place]), History of migraines, History of ovarian cyst, Insomnia, unspecified, Myocardial infarction ([place]) (09-04-14), OCD (obsessive compulsive disorder), Osteopenia (09/25/2016), Other screening mammogram, Pure hypercholesterolemia, Rosacea, Subacute confusional state (11/19/2012), Tubular adenoma of colon (01/10/11), Ulcerative colitis, unspecified, Unspecified asthma(493.90), Unspecified essential hypertension, Unspecified hypothyroidism, Unspecified vitamin D deficiency, and Vertigo.  PSH:    Past Surgical History:  Procedure Laterality Date  . APPENDECTOMY    . AXILLARY SENTINEL NODE BIOPSY Left 05/23/2014   Procedure: AXILLARY SENTINEL NODE BIOPSY;  Surgeon: Christene Lye, MD;  Location: [facility];  Service: General;  Laterality: Left;  . BREAST BIOPSY  Aug 2000   on tamoxifen, atypical hyperplasia  . BREAST SURGERY Left Aug 2000   lumpectomy/ Dr Sharlet [place]ina  . BREAST SURGERY Bilateral 05/23/14   Mastectomy  . BUNIONECTOMY Right   . CARDIAC CATHETERIZATION N/A 09/05/2014   Procedure: Left Heart Cath and Coronary Angiography;  Surgeon: Wellington Hampshire, MD;  Location: [facility];  Service: Cardiovascular;  Laterality: N/A;  .  CARDIAC  CATHETERIZATION  09-05-14  . CATARACT EXTRACTION W/ INTRAOCULAR LENS IMPLANT    . CATARACT EXTRACTION, BILATERAL    . CESAREAN SECTION  1996/1997   placenta previa, gest DM, pre-eclampsia  . cholecystect  1999  . CHOLECYSTECTOMY  1997   adhesions also  . COLONOSCOPY  01/2001   Ulcerative colitis  . COLONOSCOPY  04/2004   UC, polyp  . COLONOSCOPY  12/08   UC, no polyps  . DEXA  04/1999 and 2010   normal  . ESOPHAGOGASTRODUODENOSCOPY  09/2001   polyp  . exercise stress test  11/2003   negative  . FEMUR FRACTURE SURGERY Left   . MASTECTOMY Bilateral 05-23-14   Dr. Jamal Collin  . MECKEL DIVERTICULUM EXCISION  1999  . NASAL SINUS SURGERY  05/1997  . nuclear stress test  06/2007   negative  . precancerous mole removed    . SIMPLE MASTECTOMY WITH AXILLARY SENTINEL NODE BIOPSY Bilateral 05/23/2014   Procedure: Bilateral simple mastectomy, left sentinel node biopsy ;  Surgeon: Christene Lye, MD;  Location: [facility];  Service: General;  Laterality: Bilateral;  . Sleep study  11/09   no apnea, but did snore (done by [facility])  . TONSILLECTOMY  1984  . TUBAL LIGATION    . WISDOM TOOTH EXTRACTION  1980's    Current Outpatient Prescriptions  Medication Sig Dispense Refill  . Asenapine Maleate (SAPHRIS) 10 MG SUBL Place 20 mg under the tongue at bedtime.    Marland Kitchen aspirin 81 MG tablet Take 81 mg by mouth daily.    Marland Kitchen atorvastatin (LIPITOR) 80 MG tablet TAKE 1 TABLET(80 MG) BY MOUTH DAILY 30 tablet 3  . b complex vitamins capsule Take 1 capsule by mouth daily.    . chlorproMAZINE (THORAZINE) 25 MG tablet Take 25-50 mg by mouth as needed. Reported on 03/02/2015    . Cholecalciferol (VITAMIN D3) 2000 UNITS TABS Take 1 tablet by mouth daily.    . cloNIDine (CATAPRES) 0.1 MG tablet Take 0.005 mg by mouth 2 (two) times daily.    Marland Kitchen co-enzyme Q-10 50 MG capsule Take 50 mg by mouth daily.    . diazepam (VALIUM) 10 MG tablet Take 2.5-10 mg by mouth as needed for anxiety.    Marland Kitchen diltiazem (CARDIZEM CD) 240 MG  24 hr capsule Take 1 capsule (240 mg total) by mouth daily. 90 capsule 3  . DimenhyDRINATE (DRAMAMINE PO) Take by mouth as needed.    . fluticasone (FLONASE) 50 MCG/ACT nasal spray USE 2 SPRAYS IN EACH NOSTRIL TWICE DAILY 16 g 5  . gabapentin (NEURONTIN) 300 MG capsule TAKE 1 CAPSULE(300 MG) BY MOUTH THREE TIMES DAILY 90 capsule 11  . Insulin Detemir (LEVEMIR) 100 UNIT/ML Pen Inject 12 Units into the skin daily at 10 pm. (Patient taking differently: Inject 24 Units into the skin daily at 10 pm. ) 15 mL 3  . Insulin Pen Needle 31G X 5 MM MISC Use once a day with Victoza pen 30 each 3  . INVOKANA 300 MG TABS tablet TAKE 1 TABLET(300 MG) BY MOUTH DAILY BEFORE BREAKFAST 30 tablet 5  . lamoTRIgine (LAMICTAL) 200 MG tablet Take 400 mg by mouth daily.    Marland Kitchen latanoprost (XALATAN) 0.005 % ophthalmic solution Place 1 drop into both eyes At bedtime.    Marland Kitchen letrozole (FEMARA) 2.5 MG tablet Take 1 tablet (2.5 mg total) by mouth daily. 30 tablet 6  . Liniments (SALONPAS PAIN RELIEF PATCH EX) Apply topically as needed. Reported on 03/02/2015    .  LINZESS 290 MCG CAPS capsule TAKE 1 CAPSULE(290 MCG) BY MOUTH DAILY 30 capsule 0  . mesalamine (LIALDA) 1.2 g EC tablet Take 1 tablet (1.2 g total) by mouth daily with breakfast. 30 tablet 6  . metFORMIN (GLUCOPHAGE-XR) 500 MG 24 hr tablet TAKE 4 TABLETS BY MOUTH DAILY 120 tablet 3  . Multiple Vitamin (MULTIVITAMIN) tablet Take 1 tablet by mouth daily.      . nebivolol (BYSTOLIC) 10 MG tablet Take 1 tablet (10 mg total) by mouth 3 (three) times daily. Takes 1 tablet in the morning and 2 tablets after lunch 270 tablet 3  . nitrofurantoin (MACRODANTIN) 100 MG capsule Take 100 mg by mouth as directed. Reported on 03/02/2015    . Omega-3 Krill Oil 500 MG CAPS Take 500 mg by mouth.    . ondansetron (ZOFRAN) 4 MG tablet Take 1 tablet (4 mg total) by mouth every 8 (eight) hours as needed for nausea or vomiting. 30 tablet 1  . ONETOUCH VERIO test strip USE AS DIRECTED TO CHECK BLOOD  SUGAR TWICE DAILY 100 each 5  . pantoprazole (PROTONIX) 40 MG tablet TAKE 1 TABLET BY MOUTH TWICE DAILY 60 tablet 3  . Probiotic Product (PROBIOTIC DAILY PO) Take by mouth.    . promethazine (PHENERGAN) 25 MG tablet Take 1 tablet (25 mg total) by mouth every 6 (six) hours as needed for nausea or vomiting. 30 tablet 0  . ranitidine (ZANTAC) 300 MG tablet Take 0.5 tablets (150 mg total) by mouth 2 (two) times daily. 60 tablet 3  . senna-docusate (SENOKOT-S) 8.6-50 MG tablet Take 3 tablets by mouth daily.    . Simethicone (GAS-X PO) Take 1 tablet by mouth 4 (four) times daily.    Marland Kitchen SYNTHROID 137 MCG tablet TAKE 2 TABLETS BY MOUTH DAILY BEFORE BREAKFAST. 60 tablet 3  . ticagrelor (BRILINTA) 90 MG TABS tablet Take 1 tablet (90 mg total) by mouth 2 (two) times daily. 180 tablet 3  . tiZANidine (ZANAFLEX) 4 MG tablet Take 4 mg by mouth. Reported on 03/02/2015    . traZODone (DESYREL) 50 MG tablet Take 50 mg by mouth at bedtime.    . valACYclovir (VALTREX) 500 MG tablet Take 500 mg by mouth 2 (two) times daily.      Marland Kitchen VICTOZA 18 MG/3ML SOPN INJECT 1.8 MG UNDER THE SKIN DAILY AT THE SAME TIME EVERY DAY 9 mL 3  . vitamin C (ASCORBIC ACID) 500 MG tablet Take 250 mg by mouth daily. Takes 2 a day     No current facility-administered medications for this visit.      Allergies:   Ephedrine; Aspartame and phenylalanine; Aspirin; Crestor [rosuvastatin]; Erythromycin; Nsaids; Other; and Oxycodone   Social History:  The patient  reports that she quit smoking about 24 years ago. Her smoking use included cigarettes. She has a 1.25 pack-year smoking history. She has quit using smokeless tobacco. She reports that she does not drink alcohol or use drugs.   Family History:   family history includes Alzheimer's disease in her father; Breast cancer in her unknown relative; Colitis in her mother; Colon cancer in her father; Coronary artery disease in her father, mother, and unknown relative; Crohn's disease in her mother;  Dementia in her father; Diabetes in her unknown relative; Hypertension in her mother; Osteoporosis in her mother; Stroke in her cousin.    Review of Systems: Review of Systems  Constitutional: Negative.   Respiratory: Positive for shortness of breath.   Cardiovascular: Negative.   Gastrointestinal: Negative.  Musculoskeletal: Negative.   Neurological: Negative.   Psychiatric/Behavioral: Negative.   All other systems reviewed and are negative.    PHYSICAL EXAM: VS:  LMP 08/23/2006 (Approximate) Comment: tubal ligation , BMI There is no height or weight on file to calculate BMI. GEN: Well nourished, well developed, in no acute distress,obese  HEENT: normal  Neck: no JVD, carotid bruits, or masses Cardiac: RRR; no murmurs, rubs, or gallops,no edema  Respiratory:  clear to auscultation bilaterally, normal work of breathing GI: soft, nontender, nondistended, + BS MS: no deformity or atrophy  Skin: warm and dry, no rash Neuro:  Strength and sensation are intact Psych: euthymic mood, full affect    Recent Labs: 02/06/2017: TSH 0.32 02/28/2017: ALT 44; BUN 12; Creatinine, Ser 0.90; Hemoglobin 15.5; Platelets 391; Potassium 3.9; Sodium 140    Lipid Panel Lab Results  Component Value Date   CHOL 137 02/06/2017   HDL 41.90 02/06/2017   LDLCALC 69 02/06/2017   TRIG 128.0 02/06/2017      Wt Readings from Last 3 Encounters:  04/22/17 217 lb 4 oz (98.5 kg)  04/17/17 218 lb 8 oz (99.1 kg)  03/07/17 213 lb 3.2 oz (96.7 kg)       ASSESSMENT AND PLAN:  Coronary artery disease involving native coronary artery of native heart without angina pectoris - Plan: EKG 12-Lead We will change her to Plavix  75 mg daily, stop her Brilinta given the price over $100 per month New T wave inversions precordial leads, 3 and aVF Reports having shortness of breath, sedentary at baseline, unable to treadmill We will schedule her for pharmacologic Myoview  Essential hypertension - Plan: EKG  12-Lead Blood pressure is well controlled on today's visit. No changes made to the medications Stable  HYPERCHOLESTEROLEMIA Cholesterol is at goal on the current lipid regimen. No changes to the medications were made. Stable  Type 2 diabetes mellitus with other circulatory complication, unspecified long term insulin use status (HCC) Stressed importance of aggressive weight loss, strict diet Dramatic loss in weight over the past several months, complemented her  Malignant neoplasm of overlapping sites of left female breast, unspecified estrogen receptor status ([place]) Followed by oncology   Total encounter time more than 25 minutes  Greater than 50% was spent in counseling and coordination of care with the patient   Disposition:   F/U  12 months   No orders of the defined types were placed in this encounter.    Signed, Esmond Plants, M.D., Ph.D. 05/02/2017  [place], [place]

## 2017-05-05 ENCOUNTER — Ambulatory Visit (INDEPENDENT_AMBULATORY_CARE_PROVIDER_SITE_OTHER): Payer: BLUE CROSS/BLUE SHIELD | Admitting: Nurse Practitioner

## 2017-05-05 ENCOUNTER — Encounter: Payer: Self-pay | Admitting: Endocrinology

## 2017-05-05 ENCOUNTER — Other Ambulatory Visit: Payer: Self-pay

## 2017-05-05 ENCOUNTER — Encounter: Payer: Self-pay | Admitting: Nurse Practitioner

## 2017-05-05 ENCOUNTER — Other Ambulatory Visit: Payer: Self-pay | Admitting: Endocrinology

## 2017-05-05 ENCOUNTER — Ambulatory Visit: Payer: Self-pay | Admitting: Cardiovascular Disease

## 2017-05-05 VITALS — BP 110/80 | HR 80 | Ht 66.0 in | Wt 217.2 lb

## 2017-05-05 DIAGNOSIS — Z01810 Encounter for preprocedural cardiovascular examination: Secondary | ICD-10-CM | POA: Diagnosis not present

## 2017-05-05 DIAGNOSIS — I2510 Atherosclerotic heart disease of native coronary artery without angina pectoris: Secondary | ICD-10-CM | POA: Diagnosis not present

## 2017-05-05 DIAGNOSIS — I10 Essential (primary) hypertension: Secondary | ICD-10-CM

## 2017-05-05 DIAGNOSIS — E785 Hyperlipidemia, unspecified: Secondary | ICD-10-CM | POA: Diagnosis not present

## 2017-05-05 MED ORDER — INSULIN PEN NEEDLE 31G X 5 MM MISC: 0 refills | Status: DC

## 2017-05-05 NOTE — Patient Instructions (Signed)
Follow-Up: Your physician wants you to follow-up in: 6 months with Dr. Rockey Situ. You will receive a reminder letter in the mail two months in advance. If you don't receive a letter, please call our office to schedule the follow-up appointment.  It was a pleasure seeing you today here in the office. Please do not hesitate to give [place] a call back if you have any further questions. Brook [place], BSN

## 2017-05-05 NOTE — Progress Notes (Signed)
Office Visit    Patient Name: Lindsay Fry Date of Encounter: 05/05/2017  Primary Care Provider:  Abner Greenspan, MD Primary Cardiologist:  Ida Rogue, MD  Chief Complaint    57 year old female with a prior history of coronary artery disease status post non-STEMI in August 2016, hyperlipidemia, hypertension, chronic diastolic congestive heart failure, depression, diabetes, fibromyalgia, anxiety, and ulcerative colitis, who presents for follow-up and preprocedure evaluation.  Past Medical History    Past Medical History:  Diagnosis Date  . ADD (attention deficit disorder) 11/19/2012  . Allergic rhinitis, cause unspecified   . Anxiety state, unspecified   . Arthritis   . Asymptomatic postmenopausal status (age-related) (natural)   . Atypical hyperplasia of left breast 2000  . Benign neoplasm of other and unspecified site of the digestive system   . Bipolar disorder ([place])   . Breast cancer ([place])    Left Breast 81m invasive CA left uiq, dcis left uoq and a lot ADH, ALH in both breasts.  .Marland KitchenCAD (coronary artery disease)    a. 08/2014 NSTEMI/Cath: LM nl, LAD 50p, D1/D2 min irregs, LCX nl, OM1 40, LPDA nl, RI 99ost (2.027mvessel->med Rx), RCA nl, AM 60, nl EF; b. 11/2016 MV: EF 74%, no ischemia (performed 2/2 dyspnea & new inf TWI).  . Marland Kitchenarcinoma of left breast ([place]5/19/2016  . Chronic diastolic CHF (congestive heart failure) ([place]   a. 08/2014 Echo: EF 60-65%, Gr 1 DD, nl LA.  . Marland Kitchenyst    on Achilles tendon  . Depression   . Diabetes mellitus ([place]   frank  . Dysuria   . Edema   . Family history of malignant neoplasm of gastrointestinal tract   . Fibromyalgia   . GERD (gastroesophageal reflux disease)   . Glaucoma    pre glaucoma  . Headache    migraines  . Hiatal hernia   . History of alcoholism ([place]  . History of migraines   . History of ovarian cyst   . Insomnia, unspecified   . Myocardial infarction ([place]8-21-16  . OCD (obsessive compulsive disorder)     . Osteopenia 09/25/2016   Femoral neck T -1.1  9/18  . Other screening mammogram   . Pure hypercholesterolemia   . Rosacea   . Subacute confusional state 11/19/2012  . Tubular adenoma of colon 01/10/11  . Ulcerative colitis, unspecified   . Unspecified asthma(493.90)   . Unspecified essential hypertension   . Unspecified hypothyroidism   . Unspecified vitamin D deficiency   . Vertigo    Past Surgical History:  Procedure Laterality Date  . APPENDECTOMY    . AXILLARY SENTINEL NODE BIOPSY Left 05/23/2014   Procedure: AXILLARY SENTINEL NODE BIOPSY;  Surgeon: SeChristene LyeMD;  Location: [facility];  Service: General;  Laterality: Left;  . BREAST BIOPSY  Aug 2000   on tamoxifen, atypical hyperplasia  . BREAST SURGERY Left Aug 2000   lumpectomy/ Dr CrSharlet Salina. BREAST SURGERY Bilateral 05/23/14   Mastectomy  . BUNIONECTOMY Right   . CARDIAC CATHETERIZATION N/A 09/05/2014   Procedure: Left Heart Cath and Coronary Angiography;  Surgeon: MuWellington HampshireMD;  [facility];  Service: Cardiovascular;  Laterality: N/A;  . CARDIAC CATHETERIZATION  09-05-14  . CATARACT EXTRACTION W/ INTRAOCULAR LENS IMPLANT    . CATARACT EXTRACTION, BILATERAL    . CESAREAN SECTION  1996/1997   placenta previa, gest DM, pre-eclampsia  . cholecystect  1999  . CHOLECYSTECTOMY  1997   adhesions also  . COLONOSCOPY  01/2001   Ulcerative colitis  . COLONOSCOPY  04/2004   UC, polyp  . COLONOSCOPY  12/08   UC, no polyps  . DEXA  04/1999 and 2010   normal  . ESOPHAGOGASTRODUODENOSCOPY  09/2001   polyp  . exercise stress test  11/2003   negative  . FEMUR FRACTURE SURGERY Left   . MASTECTOMY Bilateral 05-23-14   Dr. Jamal Collin  . MECKEL DIVERTICULUM EXCISION  1999  . NASAL SINUS SURGERY  05/1997  . nuclear stress test  06/2007   negative  . precancerous mole removed    . SIMPLE MASTECTOMY WITH AXILLARY SENTINEL NODE BIOPSY Bilateral 05/23/2014   Procedure: Bilateral simple mastectomy, left  sentinel node biopsy ;  Surgeon: Christene Lye, MD;  Location: [facility] ORS;  Service: General;  Laterality: Bilateral;  . Sleep study  11/09   no apnea, but did snore (done by [facility])  . TONSILLECTOMY  1984  . TUBAL LIGATION    . WISDOM TOOTH EXTRACTION  1980's    Allergies  Allergies  Allergen Reactions  . Ephedrine Other (See Comments)    Pt becomes hyper  . Aspartame And Phenylalanine Nausea Only  . Aspirin     REACTION: aggrivates colitis  . Crestor [Rosuvastatin]     REACTION: increased lfts  . Erythromycin     REACTION: GI upset  . Nsaids     REACTION: aggrivate colitis  . Other Other (See Comments)    Unable to take due to history of ulcerative colitis  . Oxycodone Other (See Comments)    Panic Attack    History of Present Illness    57 year old female with the above complex past medical history including coronary artery disease status post non-STEMI in August 2016.  Catheterization at that time showed severe ostial ramus intermedius disease, which is medically managed secondary to being a small vessel-2.0 mm.  She otherwise had nonobstructive disease.  LV function was normal.  Other history includes hypertension, hyperlipidemia, HFpEF, bipolar disorder, anxiety, diabetes, fiber myalgia, and ulcerative colitis.  She was last seen in clinic in October, at which time she reported some dyspnea on exertion and was noted to have a new anterior T changes.  She subsequently underwent stress testing which was nonischemic.  Since her last visit, she has done well without any recurrence of dyspnea or chest pain.  She had previously been doing water exercises but her pool suffered from a snowstorm in December and she will not be able to get back into the pool until at least May.  She is able to walk around and do housework though after about 15 to 20 minutes, she notices low back pain, which limits activities.  She denies PND, orthopnea, dizziness, syncope, edema, early satiety,  palpitations, chest pain.  She says at this point, she only experience his dyspnea when walking upstairs.  Home Medications    Prior to Admission medications   Medication Sig Start Date End Date Taking? Authorizing Provider  amantadine (SYMMETREL) 100 MG capsule Take 100 mg by mouth 2 (two) times daily.   Yes [provider]  atorvastatin (LIPITOR) 80 MG tablet TAKE 1 TABLET(80 MG) BY MOUTH DAILY 12/23/16  Yes Gollan, Kathlene November, MD  b complex vitamins capsule Take 1 capsule by mouth daily.   Yes [provider]  B-D UF III MINI PEN NEEDLES 31G X 5 MM MISC USE FOR INJECTIONS TWICE DAILY 05/05/17  Yes Elayne Snare, MD  BAYER MICROLET LANCETS lancets USE AS DIRECTED TO CHECK BLOOD SUGAR TWICE DAILY 05/20/16  Yes Elayne Snare, MD  Blood Glucose Monitoring Suppl (CONTOUR NEXT EZ MONITOR) w/Device KIT USE AS DIRECTED TO CHECK BLOOD SUGAR TWICE DAILY 05/20/16  Yes Elayne Snare, MD  BYSTOLIC 10 MG tablet TAKE 1 TABLET BY MOUTH IN THE MORNING AND 2 TABLETS AFTER LUNCH 05/02/16  Yes Gollan, Kathlene November, MD  Calcium Carb-Cholecalciferol (CALCIUM PLUS VITAMIN D3) 600-800 MG-UNIT TABS Take 1 tablet by mouth daily.   Yes [provider]  CARTIA XT 240 MG 24 hr capsule TAKE 1 CAPSULE(240 MG) BY MOUTH DAILY 12/12/16  Yes Gollan, Kathlene November, MD  chlorproMAZINE (THORAZINE) 25 MG tablet Take 25-50 mg by mouth as needed. Reported on 03/02/2015   Yes [provider]  cloNIDine (CATAPRES) 0.1 MG tablet TAKE 1 TABLET(0.1 MG) BY MOUTH TWICE DAILY Patient taking differently: TAKE 0.5 MG TABLET(0.1 MG) BY MOUTH TWICE DAILY 12/12/16  Yes Gollan, Kathlene November, MD  clopidogrel (PLAVIX) 75 MG tablet Take 1 tablet (75 mg total) by mouth daily. 11/12/16  Yes Minna Merritts, MD  co-enzyme Q-10 50 MG capsule Take 50 mg by mouth daily.   Yes [provider]  diazepam (VALIUM) 10 MG tablet Take 2.5-10 mg by mouth as needed for anxiety.   Yes [provider]  diphenoxylate-atropine (LOMOTIL)  2.5-0.025 MG tablet Take 1-2 tablets by mouth 3 times a day as needed for diarrhea 08/13/16  Yes Irene Shipper, MD  FIBER SELECT GUMMIES PO Take 3 capsules by mouth daily.   Yes [provider]  gabapentin (NEURONTIN) 300 MG capsule TAKE 1 CAPSULE(300 MG) BY MOUTH THREE TIMES DAILY 04/22/17  Yes Tower, Marne A, MD  glucose blood (BAYER CONTOUR NEXT TEST) test strip USE AS DIRECTED TO CHECK BLOOD SUGAR TWICE DAILY 05/20/16  Yes Elayne Snare, MD  Insulin Detemir (LEVEMIR FLEXTOUCH) 100 UNIT/ML Pen INJECT 48 UNITS UNDER THE SKIN DAILY AT 10 PM 03/07/17  Yes Elayne Snare, MD  INVOKANA 300 MG TABS tablet TAKE 1 TABLET(300 MG) BY MOUTH DAILY BEFORE BREAKFAST 04/24/17  Yes Elayne Snare, MD  lamoTRIgine (LAMICTAL) 200 MG tablet Take 400 mg by mouth daily.   Yes [provider]  latanoprost (XALATAN) 0.005 % ophthalmic solution Place 1 drop into both eyes At bedtime. 10/12/10  Yes [provider]  letrozole (FEMARA) 2.5 MG tablet Take 1 tablet (2.5 mg total) by mouth daily. 02/28/17  Yes Cammie Sickle, MD  levothyroxine (SYNTHROID) 200 MCG tablet Take 1 tablet (200 mcg total) by mouth daily before breakfast. 03/11/17  Yes Elayne Snare, MD  LINZESS 145 MCG CAPS capsule TAKE 1 CAPSULE(145 MCG) BY MOUTH DAILY BEFORE BREAKFAST 04/14/17  Yes Irene Shipper, MD  lisdexamfetamine (VYVANSE) 40 MG capsule Take 40 mg by mouth every morning.   Yes [provider]  lithium carbonate (ESKALITH) 450 MG CR tablet Take 900 mg by mouth at bedtime.   Yes [provider]  Melatonin 10 MG CAPS Take 1 capsule by mouth at bedtime.   Yes [provider]  mesalamine (LIALDA) 1.2 g EC tablet Take 2 tablets (2.4 g total) by mouth daily. 06/25/16  Yes Irene Shipper, MD  Multiple Vitamin (MULTIVITAMIN) tablet Take 1 tablet by mouth daily.     Yes [provider]  nitrofurantoin (MACRODANTIN) 100 MG capsule Take 100 mg by mouth as directed. Reported on 03/02/2015   Yes [provider]  Omega-3 Krill Oil 500 MG  CAPS Take 500 mg by mouth.   Yes [provider]  ondansetron (ZOFRAN ODT) 4 MG disintegrating tablet Take 1 each morning then every 6 hours for nausea. 08/08/16  Yes Esterwood, Amy S, PA-C  pantoprazole (PROTONIX) 40 MG tablet TAKE 1 TABLET BY MOUTH TWICE DAILY 12/30/16  Yes Irene Shipper, MD  Polyethylene Glycol 3350 (MIRALAX PO) Take by mouth daily as needed.   Yes [provider]  Probiotic Product (PROBIOTIC DAILY PO) Take by mouth.   Yes [provider]  QUEtiapine (SEROQUEL XR) 400 MG 24 hr tablet Take 1 tablet by mouth at bedtime. 02/04/17  Yes [provider]  ranitidine (ZANTAC) 300 MG tablet Take 0.5 tablets (150 mg total) by mouth 2 (two) times daily. 07/26/15  Yes Irene Shipper, MD  Semaglutide [facility]) 1 MG/DOSE SOPN Inject 1 mg into the skin once a week. 04/13/17  Yes Elayne Snare, MD  Simethicone (GAS-X PO) Take 1 tablet by mouth 4 (four) times daily.   Yes [provider]  valACYclovir (VALTREX) 500 MG tablet TAKE 1 TABLET(500 MG) once daily by mouth 04/22/17  Yes Tower, Wynelle Fanny, MD  vitamin C (ASCORBIC ACID) 500 MG tablet Take 250 mg by mouth daily. Takes 2 a day   Yes [provider]  zaleplon (SONATA) 10 MG capsule Take 20 mg by mouth at bedtime as needed for sleep.   Yes [provider]    Review of Systems    As above, she has low back pain which limits activity somewhat.  She does have dyspnea on exertion when walking upstairs.  She denies chest pain, palpitations, PND, orthopnea, dizziness, syncope, edema, or early satiety.  All other systems reviewed and are otherwise negative except as noted above.  Physical Exam    VS:  BP 110/80 (BP Location: Right Arm, Patient Position: Sitting, Cuff Size: Normal)   Pulse 80   Ht '5\' 6"'$  (1.676 m)   Wt 217 lb 4 oz (98.5 kg)   LMP 08/23/2006 (Approximate) Comment: tubal ligation  BMI 35.07 kg/m  , BMI Body mass index is 35.07  kg/m. GEN: Well nourished, well developed, in no acute distress.  HEENT: normal.  Neck: Supple, no JVD, carotid bruits, or masses. Cardiac: RRR, distant heart sounds, no murmurs, rubs, or gallops. No clubbing, cyanosis, edema.  Radials/DP/PT 2+ and equal bilaterally.  Respiratory:  Respirations regular and unlabored, clear to auscultation bilaterally. GI: Soft, nontender, nondistended, BS + x 4. MS: no deformity or atrophy. Skin: warm and dry, no rash. Neuro:  Strength and sensation are intact. Psych: Normal affect.  Accessory Clinical Findings    ECG-regular sinus rhythm, first-degree AV block, 80, PVCs, left axis, T wave inversion 1 and aVL.  Anterior T wave inversion which was present in October is now absent.  Assessment & Plan    1.  Coronary artery disease/preprocedure evaluation: Patient status post non-STEMI in 2016 with catheterization revealing severe small vessel/ramus intermedius disease, and otherwise nonobstructive disease.  She has been medically managed and is known to have normal LV function.  She underwent stress testing in November, which was nonischemic.  Since then, she has done well from a cardiac standpoint without chest pain or significant dyspnea.  Biggest limiting factor is low back pain.  She is scheduled for her annual colonoscopy.  She may proceed with colonoscopy without any additional ischemic evaluation.  She has prior aspirin intolerance and is on chronic Plavix.  She may come off of Plavix 5 days  prior to colonoscopy.  Continue statin, beta-blocker.  2.  Essential hypertension: Stable on beta-blocker, calcium channel blocker, and clonidine.  3.  Hyperlipidemia: She remains on statin therapy 4..  Last LDL was at goal at 66 in January 2019.  4.  Ulcerative colitis: Pending colonoscopy.  See above.  5.  Type 2 diabetes mellitus: Managed per primary care.  Hemoglobin A1c 6.4 in January.  6.  Disposition: Follow-up with Dr. Rockey Situ in 6 months or sooner if  necessary.  Murray Hodgkins, NP 05/05/2017, 2:55 PM

## 2017-05-09 ENCOUNTER — Encounter: Payer: Self-pay | Admitting: Internal Medicine

## 2017-05-12 ENCOUNTER — Other Ambulatory Visit: Payer: Self-pay

## 2017-05-12 MED ORDER — LINACLOTIDE 290 MCG PO CAPS: 290.0000 ug | ORAL_CAPSULE | Freq: Every day | ORAL | 3 refills | Status: DC

## 2017-05-14 ENCOUNTER — Ambulatory Visit: Payer: Self-pay | Admitting: Cardiovascular Disease

## 2017-05-15 ENCOUNTER — Ambulatory Visit (INDEPENDENT_AMBULATORY_CARE_PROVIDER_SITE_OTHER): Payer: BLUE CROSS/BLUE SHIELD | Admitting: Psychology

## 2017-05-15 DIAGNOSIS — F3131 Bipolar disorder, current episode depressed, mild: Secondary | ICD-10-CM

## 2017-05-22 ENCOUNTER — Other Ambulatory Visit: Payer: Self-pay | Admitting: Endocrinology

## 2017-05-26 ENCOUNTER — Encounter: Payer: Self-pay | Admitting: Internal Medicine

## 2017-05-27 DIAGNOSIS — D229 Melanocytic nevi, unspecified: Secondary | ICD-10-CM | POA: Diagnosis not present

## 2017-05-27 DIAGNOSIS — D485 Neoplasm of uncertain behavior of skin: Secondary | ICD-10-CM | POA: Diagnosis not present

## 2017-05-27 DIAGNOSIS — Z1283 Encounter for screening for malignant neoplasm of skin: Secondary | ICD-10-CM | POA: Diagnosis not present

## 2017-05-27 DIAGNOSIS — D224 Melanocytic nevi of scalp and neck: Secondary | ICD-10-CM | POA: Diagnosis not present

## 2017-05-27 DIAGNOSIS — D225 Melanocytic nevi of trunk: Secondary | ICD-10-CM | POA: Diagnosis not present

## 2017-05-27 DIAGNOSIS — L820 Inflamed seborrheic keratosis: Secondary | ICD-10-CM | POA: Diagnosis not present

## 2017-05-27 DIAGNOSIS — L821 Other seborrheic keratosis: Secondary | ICD-10-CM | POA: Diagnosis not present

## 2017-05-27 HISTORY — DX: Melanocytic nevi, unspecified: D22.9

## 2017-05-30 DIAGNOSIS — F900 Attention-deficit hyperactivity disorder, predominantly inattentive type: Secondary | ICD-10-CM | POA: Diagnosis not present

## 2017-05-30 DIAGNOSIS — F3176 Bipolar disorder, in full remission, most recent episode depressed: Secondary | ICD-10-CM | POA: Diagnosis not present

## 2017-05-30 DIAGNOSIS — F3174 Bipolar disorder, in full remission, most recent episode manic: Secondary | ICD-10-CM | POA: Diagnosis not present

## 2017-06-02 ENCOUNTER — Encounter: Payer: Self-pay | Admitting: Internal Medicine

## 2017-06-02 ENCOUNTER — Ambulatory Visit: Payer: Self-pay | Admitting: Endocrinology

## 2017-06-04 ENCOUNTER — Ambulatory Visit (AMBULATORY_SURGERY_CENTER): Payer: BLUE CROSS/BLUE SHIELD | Admitting: Internal Medicine

## 2017-06-04 ENCOUNTER — Other Ambulatory Visit: Payer: Self-pay

## 2017-06-04 ENCOUNTER — Encounter: Payer: Self-pay | Admitting: Internal Medicine

## 2017-06-04 VITALS — BP 113/74 | HR 46 | Temp 99.5°F | Resp 18 | Ht 66.0 in | Wt 218.0 lb

## 2017-06-04 DIAGNOSIS — D122 Benign neoplasm of ascending colon: Secondary | ICD-10-CM | POA: Diagnosis not present

## 2017-06-04 DIAGNOSIS — D123 Benign neoplasm of transverse colon: Secondary | ICD-10-CM

## 2017-06-04 DIAGNOSIS — D124 Benign neoplasm of descending colon: Secondary | ICD-10-CM | POA: Diagnosis not present

## 2017-06-04 DIAGNOSIS — Z86010 Personal history of colonic polyps: Secondary | ICD-10-CM

## 2017-06-04 DIAGNOSIS — Z800 Family history of malignant neoplasm of digestive organs: Secondary | ICD-10-CM | POA: Diagnosis not present

## 2017-06-04 MED ORDER — SODIUM CHLORIDE 0.9 % IV SOLN: 500.0000 mL | Freq: Once | INTRAVENOUS | Status: DC

## 2017-06-04 NOTE — Patient Instructions (Signed)
*  Handout given on polyps. Await pathology.  YOU HAD AN ENDOSCOPIC PROCEDURE TODAY AT [facility]:   Refer to the procedure report that was given to you for any specific questions about what was found during the examination.  If the procedure report does not answer your questions, please call your gastroenterologist to clarify.  If you requested that your care partner not be given the details of your procedure findings, then the procedure report has been included in a sealed envelope for you to review at your convenience later.  YOU SHOULD EXPECT: Some feelings of bloating in the abdomen. Passage of more gas than usual.  Walking can help get rid of the air that was put into your GI tract during the procedure and reduce the bloating. If you had a lower endoscopy (such as a colonoscopy or flexible sigmoidoscopy) you may notice spotting of blood in your stool or on the toilet paper. If you underwent a bowel prep for your procedure, you may not have a normal bowel movement for a few days.  Please Note:  You might notice some irritation and congestion in your nose or some drainage.  This is from the oxygen used during your procedure.  There is no need for concern and it should clear up in a day or so.  SYMPTOMS TO REPORT IMMEDIATELY:   Following lower endoscopy (colonoscopy or flexible sigmoidoscopy):  Excessive amounts of blood in the stool  Significant tenderness or worsening of abdominal pains  Swelling of the abdomen that is new, acute  Fever of 100F or higher   For urgent or emergent issues, a gastroenterologist can be reached at any hour by calling [phone].   DIET:  We do recommend a small meal at first, but then you may proceed to your regular diet.  Drink plenty of fluids but you should avoid alcoholic beverages for 24 hours.  ACTIVITY:  You should plan to take it easy for the rest of today and you should NOT DRIVE or use heavy machinery until tomorrow (because of  the sedation medicines used during the test).    FOLLOW UP: Our staff will call the number listed on your records the next business day following your procedure to check on you and address any questions or concerns that you may have regarding the information given to you following your procedure. If we do not reach you, we will leave a message.  However, if you are feeling well and you are not experiencing any problems, there is no need to return our call.  We will assume that you have returned to your regular daily activities without incident.  If any biopsies were taken you will be contacted by phone or by letter within the next 1-3 weeks.  Please call us at [phone] if you have not heard about the biopsies in 3 weeks.    SIGNATURES/CONFIDENTIALITY: You and/or your care partner have signed paperwork which will be entered into your electronic medical record.  These signatures attest to the fact that that the information above on your After Visit Summary has been reviewed and is understood.  Full responsibility of the confidentiality of this discharge information lies with you and/or your care-partner.

## 2017-06-04 NOTE — Progress Notes (Signed)
Called to room to assist during endoscopic procedure.  Patient ID and intended procedure confirmed with present staff. Received instructions for my participation in the procedure from the performing physician.  

## 2017-06-04 NOTE — Op Note (Signed)
[facility] Patient Name: Lindsay Fry Procedure Date: 06/04/2017 2:28 PM MRN: [id number] Endoscopist: Docia Chuck. Henrene Pastor , MD Age: 57 Referring MD:  Date of Birth: 1960/09/28 Gender: Female Account #: [id number] Procedure:                Colonoscopy, with cold snare polypectomy x 5 Indications:              High risk colon cancer surveillance: Personal                            history of multiple (3 or more) adenomas.                            Long-standing ulcerative colitis. Family history of                            colon cancer in father. Under close surveillance.                            Most recent examinations 2016, 2017, 2018 Medicines:                Monitored Anesthesia Care Procedure:                Pre-Anesthesia Assessment:                           - Prior to the procedure, a History and Physical                            was performed, and patient medications and                            allergies were reviewed. The patient's tolerance of                            previous anesthesia was also reviewed. The risks                            and benefits of the procedure and the sedation                            options and risks were discussed with the patient.                            All questions were answered, and informed consent                            was obtained. Prior Anticoagulants: The patient has                            taken Plavix (clopidogrel), last dose was 6 days                            prior to procedure. ASA Grade Assessment: III - A  patient with severe systemic disease. After                            reviewing the risks and benefits, the patient was                            deemed in satisfactory condition to undergo the                            procedure.                           After obtaining informed consent, the colonoscope                            was passed under direct  vision. Throughout the                            procedure, the patient's blood pressure, pulse, and                            oxygen saturations were monitored continuously. The                            Colonoscope was introduced through the anus and                            advanced to the the cecum, identified by                            appendiceal orifice and ileocecal valve. The                            ileocecal valve, appendiceal orifice, and rectum                            were photographed. The quality of the bowel                            preparation was good. The colonoscopy was performed                            WITH DIFFICULTY secondary to redundant colon and                            body habitus. The patient tolerated the procedure                            well. The bowel preparation used was SUPREP. Scope In: 0:09:38 PM Scope Out: 3:16:09 PM Scope Withdrawal Time: 0 hours 26 minutes 52 seconds  Total Procedure Duration: 0 hours 39 minutes 28 seconds  Findings:                 Five sessile polyps were found in the descending  colon, transverse colon and ascending colon. The                            polyps were 4 to 6 mm in size. These polyps were                            removed with a cold snare. Resection and retrieval                            were complete.                           The exam was otherwise without abnormality on                            direct and retroflexion views. Complications:            No immediate complications. Estimated blood loss:                            None. Estimated Blood Loss:     Estimated blood loss: none. Impression:               - Five 4 to 6 mm polyps in the descending colon, in                            the transverse colon and in the ascending colon,                            removed with a cold snare. Resected and retrieved.                           - The examination was  otherwise normal on direct                            and retroflexion views.                           - Difficult colonoscopy secondary to redundant                            colon and body habitus Recommendation:           - Repeat colonoscopy in 1 year for surveillance.                            See Dr. Henrene Pastor in office prior.                           - Resume Plavix (clopidogrel) today at prior dose.                           - Patient has a contact number available for  emergencies. The signs and symptoms of potential                            delayed complications were discussed with the                            patient. Return to normal activities tomorrow.                            Written discharge instructions were provided to the                            patient.                           - Resume previous diet.                           - Continue present medications.                           - Await pathology results.                           - Continue to work on [employer] loss Docia Chuck. Henrene Pastor, MD 06/04/2017 3:26:13 PM This report has been signed electronically.

## 2017-06-04 NOTE — Progress Notes (Signed)
A/ox3 pleased with MAC, report to RN 

## 2017-06-05 ENCOUNTER — Ambulatory Visit: Payer: Self-pay | Admitting: Endocrinology

## 2017-06-05 ENCOUNTER — Telehealth: Payer: Self-pay | Admitting: *Deleted

## 2017-06-05 ENCOUNTER — Telehealth: Payer: Self-pay

## 2017-06-05 NOTE — Telephone Encounter (Signed)
Left message

## 2017-06-05 NOTE — Telephone Encounter (Signed)
  Follow up Call-  Call back number 06/04/2017 05/09/2016 03/02/2015  Post procedure Call Back phone  # [phone]  Permission to leave phone message Yes Yes Yes  Some recent data might be hidden     Patient questions:  Do you have a fever, pain , or abdominal swelling? No. Pain Score  0 *  Have you tolerated food without any problems? Yes.    Have you been able to return to your normal activities? Yes.    Do you have any questions about your discharge instructions: Diet   No. Medications  No. Follow up visit  No.  Do you have questions or concerns about your Care? No.  Actions: * If pain score is 4 or above: No action needed, pain <4.

## 2017-06-12 ENCOUNTER — Encounter: Payer: Self-pay | Admitting: Internal Medicine

## 2017-06-16 ENCOUNTER — Other Ambulatory Visit: Payer: Self-pay | Admitting: Family Medicine

## 2017-06-20 ENCOUNTER — Ambulatory Visit (INDEPENDENT_AMBULATORY_CARE_PROVIDER_SITE_OTHER): Payer: BLUE CROSS/BLUE SHIELD | Admitting: Psychology

## 2017-06-20 DIAGNOSIS — F3131 Bipolar disorder, current episode depressed, mild: Secondary | ICD-10-CM | POA: Diagnosis not present

## 2017-06-22 ENCOUNTER — Other Ambulatory Visit: Payer: Self-pay | Admitting: Endocrinology

## 2017-06-23 ENCOUNTER — Telehealth: Payer: Self-pay | Admitting: Endocrinology

## 2017-06-23 NOTE — Telephone Encounter (Signed)
Pt called and informed her that this office has Ozempic and Invokana copay card. She stated that she would come pick them up tomorrow.

## 2017-06-23 NOTE — Telephone Encounter (Signed)
[employer] would like a copay card for Levemir, ozempic, Invokana. Please advise when to come pick it up.

## 2017-06-28 ENCOUNTER — Other Ambulatory Visit: Payer: Self-pay | Admitting: Cardiovascular Disease

## 2017-06-30 ENCOUNTER — Telehealth: Payer: Self-pay | Admitting: Cardiovascular Disease

## 2017-06-30 ENCOUNTER — Other Ambulatory Visit: Payer: Self-pay | Admitting: *Deleted

## 2017-06-30 ENCOUNTER — Encounter: Payer: Self-pay | Admitting: Cardiovascular Disease

## 2017-06-30 NOTE — Telephone Encounter (Signed)
Fax received from [employer] in [place] that the patient's bystolic 10 mg tablets are requiring prior authorization. PA submitted via CoverMyMeds- awaiting response.

## 2017-07-03 ENCOUNTER — Encounter: Payer: Self-pay | Admitting: Cardiovascular Disease

## 2017-07-03 NOTE — Telephone Encounter (Signed)
Outcome denied on June 64RC for Bystolic. Will watch for fax with further instructions.

## 2017-07-06 NOTE — Telephone Encounter (Signed)
We could change the bystolic to metoprolol succinate 50 mg daily in the evening

## 2017-07-07 MED ORDER — METOPROLOL SUCCINATE ER 50 MG PO TB24: 50.0000 mg | ORAL_TABLET | Freq: Every evening | ORAL | 3 refills | Status: DC

## 2017-07-07 NOTE — Telephone Encounter (Signed)
Patient notified to stop Bystolic and start metoprolol succinate 50 mg every evening. She was very [employer] attorney. Rx sent to pharmacy.

## 2017-07-07 NOTE — Telephone Encounter (Signed)
No answer. Left message to call back.   

## 2017-07-14 ENCOUNTER — Ambulatory Visit (INDEPENDENT_AMBULATORY_CARE_PROVIDER_SITE_OTHER): Payer: BLUE CROSS/BLUE SHIELD | Admitting: Psychology

## 2017-07-14 ENCOUNTER — Other Ambulatory Visit: Payer: Self-pay | Admitting: Endocrinology

## 2017-07-14 DIAGNOSIS — F3131 Bipolar disorder, current episode depressed, mild: Secondary | ICD-10-CM | POA: Diagnosis not present

## 2017-07-22 ENCOUNTER — Other Ambulatory Visit: Payer: Self-pay | Admitting: Endocrinology

## 2017-07-24 ENCOUNTER — Other Ambulatory Visit (INDEPENDENT_AMBULATORY_CARE_PROVIDER_SITE_OTHER): Payer: BLUE CROSS/BLUE SHIELD

## 2017-07-24 DIAGNOSIS — E1165 Type 2 diabetes mellitus with hyperglycemia: Secondary | ICD-10-CM

## 2017-07-24 DIAGNOSIS — Z794 Long term (current) use of insulin: Secondary | ICD-10-CM | POA: Diagnosis not present

## 2017-07-24 LAB — T4, FREE: Free T4: 0.77 ng/dL (ref 0.60–1.60)

## 2017-07-24 LAB — TSH: TSH: 2.29 u[IU]/mL (ref 0.35–4.50)

## 2017-07-24 LAB — BASIC METABOLIC PANEL
BUN: 13 mg/dL (ref 6–23)
CO2: 29 mEq/L (ref 19–32)
Calcium: 10.3 mg/dL (ref 8.4–10.5)
Chloride: 105 mEq/L (ref 96–112)
Creatinine, Ser: 0.88 mg/dL (ref 0.40–1.20)
GFR: 69.9 mL/min (ref >60.00)
Glucose, Bld: 147 mg/dL — ABNORMAL HIGH (ref 70–99)
Potassium: 4 mEq/L (ref 3.5–5.1)
Sodium: 142 mEq/L (ref 135–145)

## 2017-07-24 LAB — HEMOGLOBIN A1C: Hgb A1c MFr Bld: 6.6 % — ABNORMAL HIGH (ref 4.6–6.5)

## 2017-07-30 ENCOUNTER — Encounter: Payer: Self-pay | Admitting: Endocrinology

## 2017-07-30 ENCOUNTER — Ambulatory Visit: Payer: BLUE CROSS/BLUE SHIELD | Admitting: Endocrinology

## 2017-07-30 VITALS — BP 124/84 | HR 87 | Ht 66.0 in | Wt 215.0 lb

## 2017-07-30 DIAGNOSIS — E890 Postprocedural hypothyroidism: Secondary | ICD-10-CM

## 2017-07-30 DIAGNOSIS — I951 Orthostatic hypotension: Secondary | ICD-10-CM

## 2017-07-30 DIAGNOSIS — E1165 Type 2 diabetes mellitus with hyperglycemia: Secondary | ICD-10-CM | POA: Diagnosis not present

## 2017-07-30 DIAGNOSIS — Z794 Long term (current) use of insulin: Secondary | ICD-10-CM | POA: Diagnosis not present

## 2017-07-30 NOTE — Patient Instructions (Signed)
Stop am clonidine

## 2017-07-30 NOTE — Progress Notes (Signed)
Patient ID: Lindsay Fry, female   DOB: 1960-10-17, 57 y.o.   MRN: [id number]   Reason for Appointment:  Hypothyroidism and diabetes, followup visit  History of Present Illness:  DIABETES:  Prior history: With her high A1c of 6.7 in 03/2014 she had an abnormal glucose tolerance test indicating diabetes and 1 hour glucose of 300 She was started on metformin; however even with 1500 mg of metformin ER  blood sugars were not controlled especially fasting Subsequently with taking Janumet XR 100/1000 daily her blood sugars were much better Because of her weight gain and A1c going up to 7.3 along with occasional readings over 200 she was started on Victoza in 11/16 She has had progressive hyperglycemia previously and A1c was up to 8.6% in 4/17  She was started on Levemir insulin in 08/2015 because of progressive rise in blood sugars and inability to control with 3 other agents  Non-insulin hypoglycemic drugs: Ozempic 1 mg weekly metformin ER 2000 mg daily, Invokana 300 mg daily  INSULIN regimen: Levemir 48 units at bedtime  A1c is 6.6, last 6.4 in January  Current management, blood sugar patterns and problems identified:  She is now taking 1 mg Ozempic since her last visit and has had no nausea with this  She has started doing more regular exercise with water aerobics and walking  However her weight has fluctuated and is not improving this year  Generally checking blood sugars only in the morning and these are consistently mildly increased and mostly around 130-150  Has only a couple of readings after evening meal which were not high  She does take her insulin, Ozempic and Invokana very consistently  Exercise regimen: Water aerobics and walking  GLUCOSE readings from review of the CONTOUR monitor  FASTING range 125-183 Bedtime 138, 150 Unable to download meter   Weight history:  Wt Readings from Last 3 Encounters:  07/30/17 215 lb (97.5 kg)  06/04/17 218 lb  (98.9 kg)  05/05/17 217 lb 4 oz (98.5 kg)   Previous consultation with dietitian: none      LABS:  Lab Results  Component Value Date   HGBA1C 6.6 (H) 07/24/2017   HGBA1C 6.4 02/06/2017   HGBA1C 6.4 10/25/2016   Lab Results  Component Value Date   MICROALBUR 3.6 (H) 02/06/2017   [place] 69 02/06/2017   CREATININE 0.88 07/24/2017     HYPOTHYROIDISM: This was first diagnosed in 1992 after her treatment for Graves' disease with I-131 She has been on relatively large doses of thyroxine supplements She was on 224 mcg since 11/13 and her TSH was normal in 3/14 Subjectively difficult to assess her thyroid since she tends to have fatigue chronically.  In 2014 her dose was reduced to 200 mcg daily and her dose has been fluctuating since then She had required periodic increase in dosage including in 05/2014 when her TSH was 20  She is  on 200 g daily, continues on the brand name Synthroid  Her symptoms are usually nonspecific for hypothyroidism, mostly related to other medical problems including sleep disturbances and bipolar illness. She is generally feeling fairly good  She has been quite regular with the Synthroid every day before breakfast         TSH is back to normal with adjusting her dose in January  Lab Results  Component Value Date   TSH 2.29 07/24/2017   TSH 0.32 (L) 02/06/2017   TSH 0.03 (L) 10/25/2016   FREET4 0.77 07/24/2017  FREET4 1.15 02/06/2017   FREET4 1.76 (H) 10/25/2016     Lab on 07/24/2017  Component Date Value Ref Range Status  . Sodium 07/24/2017 142  135 - 145 mEq/L Final  . Potassium 07/24/2017 4.0  3.5 - 5.1 mEq/L Final  . Chloride 07/24/2017 105  96 - 112 mEq/L Final  . CO2 07/24/2017 29  19 - 32 mEq/L Final  . Glucose, Bld 07/24/2017 147* 70 - 99 mg/dL Final  . BUN 07/24/2017 13  6 - 23 mg/dL Final  . Creatinine, Ser 07/24/2017 0.88  0.40 - 1.20 mg/dL Final  . Calcium 07/24/2017 10.3  8.4 - 10.5 mg/dL Final  . GFR 07/24/2017 69.90   >60.00 mL/min Final  . Free T4 07/24/2017 0.77  0.60 - 1.60 ng/dL Final   Comment: Specimens from patients who are undergoing biotin therapy and /or ingesting biotin supplements may contain high levels of biotin.  The higher biotin concentration in these specimens interferes with this Free T4 assay.  Specimens that contain high levels  of biotin may cause false high results for this Free T4 assay.  Please interpret results in light of the total clinical presentation of the patient.    Marland Kitchen TSH 07/24/2017 2.29  0.35 - 4.50 uIU/mL Final  . Hgb A1c MFr Bld 07/24/2017 6.6* 4.6 - 6.5 % Final   Glycemic Control Guidelines for People with Diabetes:Non Diabetic:  <6%Goal of Therapy: <7%Additional Action Suggested:  >8%     Allergies as of 07/30/2017      Reactions   Ephedrine Other (See Comments)   Pt becomes hyper   Aspartame And Phenylalanine Nausea Only   Aspirin    REACTION: aggrivates colitis   Crestor [rosuvastatin]    REACTION: increased lfts   Erythromycin    REACTION: GI upset   Nsaids    REACTION: aggrivate colitis   Other Other (See Comments)   Unable to take due to history of ulcerative colitis   Oxycodone Other (See Comments)   Panic Attack      Medication List        Accurate as of 07/30/17  3:44 PM. Always use your most recent med list.          amantadine 100 MG capsule Commonly known as:  SYMMETREL Take 100 mg by mouth 2 (two) times daily.   atorvastatin 80 MG tablet Commonly known as:  LIPITOR TAKE 1 TABLET(80 MG) BY MOUTH DAILY   b complex vitamins capsule Take 1 capsule by mouth daily.   BAYER MICROLET LANCETS lancets USE AS DIRECTED TO CHECK BLOOD SUGAR TWICE DAILY   CALCIUM PLUS VITAMIN D3 600-800 MG-UNIT Tabs Generic drug:  Calcium Carb-Cholecalciferol Take 1 tablet by mouth daily.   CARTIA XT 240 MG 24 hr capsule Generic drug:  diltiazem TAKE 1 CAPSULE(240 MG) BY MOUTH DAILY   chlorproMAZINE 25 MG tablet Commonly known as:  THORAZINE Take 25-50  mg by mouth as needed. Reported on 03/02/2015   cloNIDine 0.1 MG tablet Commonly known as:  CATAPRES TAKE 1 TABLET(0.1 MG) BY MOUTH TWICE DAILY   clopidogrel 75 MG tablet Commonly known as:  PLAVIX Take 1 tablet (75 mg total) by mouth daily.   co-enzyme Q-10 50 MG capsule Take 50 mg by mouth daily.   CONTOUR NEXT EZ MONITOR w/Device Kit USE AS DIRECTED TO CHECK BLOOD SUGAR TWICE DAILY   diazepam 10 MG tablet Commonly known as:  VALIUM Take 2.5-10 mg by mouth as needed for anxiety.   diphenoxylate-atropine 2.5-0.025 MG  tablet Commonly known as:  LOMOTIL Take 1-2 tablets by mouth 3 times a day as needed for diarrhea   FIBER SELECT GUMMIES PO Take 3 capsules by mouth daily.   gabapentin 300 MG capsule Commonly known as:  NEURONTIN TAKE 1 CAPSULE(300 MG) BY MOUTH THREE TIMES DAILY   GAS-X PO Take 1 tablet by mouth 4 (four) times daily.   glucose blood test strip Commonly known as:  BAYER CONTOUR NEXT TEST USE AS DIRECTED TO CHECK BLOOD SUGAR TWICE DAILY   Insulin Detemir 100 UNIT/ML Pen Commonly known as:  LEVEMIR FLEXTOUCH INJECT 48 UNITS UNDER THE SKIN DAILY AT 10 PM   Insulin Pen Needle 31G X 5 MM Misc Commonly known as:  B-D UF III MINI PEN NEEDLES USE FOR INJECTIONS TWICE DAILY   INVOKANA 300 MG Tabs tablet Generic drug:  canagliflozin TAKE 1 TABLET(300 MG) BY MOUTH DAILY BEFORE BREAKFAST   lamoTRIgine 200 MG tablet Commonly known as:  LAMICTAL Take 400 mg by mouth daily.   latanoprost 0.005 % ophthalmic solution Commonly known as:  XALATAN Place 1 drop into both eyes At bedtime.   letrozole 2.5 MG tablet Commonly known as:  FEMARA Take 1 tablet (2.5 mg total) by mouth daily.   linaclotide 290 MCG Caps capsule Commonly known as:  LINZESS Take 1 capsule (290 mcg total) by mouth daily before breakfast.   lisdexamfetamine 40 MG capsule Commonly known as:  VYVANSE Take 40 mg by mouth every morning.   lithium carbonate 450 MG CR tablet Commonly known  as:  ESKALITH Take 900 mg by mouth at bedtime.   Melatonin 10 MG Caps Take 1 capsule by mouth at bedtime.   mesalamine 1.2 g EC tablet Commonly known as:  LIALDA Take 2 tablets (2.4 g total) by mouth daily.   metoprolol succinate 50 MG 24 hr tablet Commonly known as:  TOPROL-XL Take 1 tablet (50 mg total) by mouth every evening. Take with or immediately following a meal.   multivitamin tablet Take 1 tablet by mouth daily.   nitrofurantoin 100 MG capsule Commonly known as:  MACRODANTIN Take 100 mg by mouth as directed. Reported on 03/02/2015   Omega-3 Krill Oil 500 MG Caps Take 500 mg by mouth.   ondansetron 4 MG disintegrating tablet Commonly known as:  ZOFRAN ODT Take 1 each morning then every 6 hours for nausea.   pantoprazole 40 MG tablet Commonly known as:  PROTONIX TAKE 1 TABLET BY MOUTH TWICE DAILY   PROBIOTIC DAILY PO Take by mouth.   QUEtiapine 400 MG 24 hr tablet Commonly known as:  SEROQUEL XR Take 1 tablet by mouth at bedtime.   ranitidine 300 MG tablet Commonly known as:  ZANTAC Take 0.5 tablets (150 mg total) by mouth 2 (two) times daily.   Semaglutide 1 MG/DOSE Sopn Commonly known as:  OZEMPIC Inject 1 mg into the skin once a week.   SYNTHROID 200 MCG tablet Generic drug:  levothyroxine TAKE 1 TABLET(200 MCG) BY MOUTH DAILY BEFORE BREAKFAST   valACYclovir 500 MG tablet Commonly known as:  VALTREX TAKE 1 TABLET(500 MG) once daily by mouth   vitamin C 500 MG tablet Commonly known as:  ASCORBIC ACID Take 250 mg by mouth daily. Takes 2 a day   zaleplon 10 MG capsule Commonly known as:  SONATA Take 20 mg by mouth at bedtime as needed for sleep.       Allergies:  Allergies  Allergen Reactions  . Ephedrine Other (See Comments)  Pt becomes hyper  . Aspartame And Phenylalanine Nausea Only  . Aspirin     REACTION: aggrivates colitis  . Crestor [Rosuvastatin]     REACTION: increased lfts  . Erythromycin     REACTION: GI upset  . Nsaids      REACTION: aggrivate colitis  . Other Other (See Comments)    Unable to take due to history of ulcerative colitis  . Oxycodone Other (See Comments)    Panic Attack    Past Medical History:  Diagnosis Date  . ADD (attention deficit disorder) 11/19/2012  . Allergic rhinitis, cause unspecified   . Anxiety state, unspecified   . Arthritis   . Asymptomatic postmenopausal status (age-related) (natural)   . Atypical hyperplasia of left breast 2000  . Benign neoplasm of other and unspecified site of the digestive system   . Bipolar disorder ([place])   . Breast cancer ([place])    Left Breast 51m invasive CA left uiq, dcis left uoq and a lot ADH, ALH in both breasts.  .Marland KitchenCAD (coronary artery disease)    a. 08/2014 NSTEMI/Cath: LM nl, LAD 50p, D1/D2 min irregs, LCX nl, OM1 40, LPDA nl, RI 99ost (2.090mvessel->med Rx), RCA nl, AM 60, nl EF; b. 11/2016 MV: EF 74%, no ischemia (performed 2/2 dyspnea & new inf TWI).  . Marland Kitchenarcinoma of left breast (HCHixton5/19/2016  . Chronic diastolic CHF (congestive heart failure) ([facility]   a. 08/2014 Echo: EF 60-65%, Gr 1 DD, nl LA.  . Marland Kitchenlotting disorder (HC[place]   on blood thiner, Plavix  . Cyst    on Achilles tendon  . Depression   . Diabetes mellitus ([facility]   frank  . Dysuria   . Edema   . Family history of malignant neoplasm of gastrointestinal tract   . Fibromyalgia   . GERD (gastroesophageal reflux disease)   . Glaucoma    pre glaucoma  . Headache    migraines  . Hiatal hernia   . History of alcoholism (HCEau [place]  . History of migraines   . History of ovarian cyst   . Insomnia, unspecified   . Myocardial infarction (HCEnter[facility]8-21-16  . OCD (obsessive compulsive disorder)   . Osteopenia 09/25/2016   Femoral neck T -1.1  9/18  . Other screening mammogram   . Pure hypercholesterolemia   . Rosacea   . Subacute confusional state 11/19/2012  . Tubular adenoma of colon 01/10/11  . Ulcerative colitis, unspecified   . Unspecified asthma(493.90)   . Unspecified  essential hypertension   . Unspecified hypothyroidism   . Unspecified vitamin D deficiency   . Vertigo     Past Surgical History:  Procedure Laterality Date  . APPENDECTOMY    . AXILLARY SENTINEL NODE BIOPSY Left 05/23/2014   Procedure: AXILLARY SENTINEL NODE BIOPSY;  Surgeon: SeChristene LyeMD;  Location: [facility] ORS;  Service: General;  Laterality: Left;  . BREAST BIOPSY  Aug 2000   on tamoxifen, atypical hyperplasia  . BREAST SURGERY Left Aug 2000   lumpectomy/ Dr CrSharlet Salina. BREAST SURGERY Bilateral 05/23/14   Mastectomy  . BUNIONECTOMY Right   . CARDIAC CATHETERIZATION N/A 09/05/2014   Procedure: Left Heart Cath and Coronary Angiography;  Surgeon: MuWellington HampshireMD;  [facility];  Service: Cardiovascular;  Laterality: N/A;  . CARDIAC CATHETERIZATION  09-05-14  . CATARACT EXTRACTION W/ INTRAOCULAR LENS IMPLANT    . CATARACT EXTRACTION W/ INTRAOCULAR LENS IMPLANT Bilateral   . CATARACT EXTRACTION,  BILATERAL    . CESAREAN SECTION  1996/1997   placenta previa, gest DM, pre-eclampsia  . cholecystect  1999  . CHOLECYSTECTOMY  1997   adhesions also  . COLONOSCOPY  01/2001   Ulcerative colitis  . COLONOSCOPY  04/2004   UC, polyp  . COLONOSCOPY  12/08   UC, no polyps  . DEXA  04/1999 and 2010   normal  . ESOPHAGOGASTRODUODENOSCOPY  09/2001   polyp  . exercise stress test  11/2003   negative  . FEMUR FRACTURE SURGERY Left   . MASTECTOMY Bilateral 05-23-14   Dr. Jamal Collin  . MECKEL DIVERTICULUM EXCISION  1999  . NASAL SINUS SURGERY  05/1997  . nuclear stress test  06/2007   negative  . precancerous mole removed    . SIMPLE MASTECTOMY WITH AXILLARY SENTINEL NODE BIOPSY Bilateral 05/23/2014   Procedure: Bilateral simple mastectomy, left sentinel node biopsy ;  Surgeon: Christene Lye, MD;  Location: [facility] ORS;  Service: General;  Laterality: Bilateral;  . Sleep study  11/09   no apnea, but did snore (done by [facility])  . TONSILLECTOMY  1984  . TUBAL  LIGATION    . WISDOM TOOTH EXTRACTION  1980's    Family History  Problem Relation Age of Onset  . Coronary artery disease Father   . Colon cancer Father   . Alzheimer's disease Father   . Dementia Father   . Coronary artery disease Mother   . Hypertension Mother   . Osteoporosis Mother   . Colitis Mother   . Crohn's disease Mother   . Breast cancer Unknown        great aunts  . Coronary artery disease Unknown        Uncle (also AAA)  . Diabetes Unknown        remote family history  . Stroke Cousin   . Esophageal cancer Neg Hx   . Rectal cancer Neg Hx   . Stomach cancer Neg Hx     Social History:  reports that she quit smoking about 24 years ago. Her smoking use included cigarettes. She has a 1.25 pack-year smoking history. She has quit using smokeless tobacco. She reports that she does not drink alcohol or use drugs.  REVIEW Of SYSTEMS:   HYPERCALCEMIA: Her calcium has been previously high, upper normal now PTH in the past has been borderline for hyperparathyroidism and is last below 20  She is taking OTC vitamin D3, 2000 units and has been started on calcium by her oncologist, 500 mg  Bone density in 9/15 shows T score -1.1 at the hip  Lab Results  Component Value Date   PTH 18 10/25/2016   CALCIUM 10.3 07/24/2017   PHOS 2.3 01/29/2007     Lab Results  Component Value Date   CALCIUM 10.3 07/24/2017   PHOS 2.3 01/29/2007   Lab Results  Component Value Date   VD25OH 24.75 (L) 02/06/2017   VD25OH 44 05/12/2013   VD25OH 47 05/19/2012     HYPERTENSION: Blood pressure is controlled, managed by cardiologist.   Currently on low-dose clonidine, Bystolic twice a day and diltiazem However periodically will feel lightheaded and has to sit down, has not checked blood pressure at home    Lab Results  Component Value Date   CREATININE 0.88 07/24/2017   BUN 13 07/24/2017   NA 142 07/24/2017   K 4.0 07/24/2017   CL 105 07/24/2017   CO2 29 07/24/2017     History of hypercholesterolemia: Management  by cardiologist and has been on 80 mg total since her MI LDL below 70  Lab Results  Component Value Date   CHOL 137 02/06/2017   HDL 41.90 02/06/2017   LDLCALC 69 02/06/2017   LDLDIRECT 67.0 11/10/2015   TRIG 128.0 02/06/2017   CHOLHDL 3 02/06/2017   Possible fatty liver: Liver functions are now better  Lab Results  Component Value Date   ALT 44 02/28/2017      Examination:   BP 124/84   Pulse 87   Ht '5\' 6"'$  (1.676 m)   Wt 215 lb (97.5 kg)   LMP 08/23/2006 (Approximate) Comment: tubal ligation  SpO2 95%   BMI 34.70 kg/m    STANDING blood pressure 98/68 with large cuff on the right side    Assessments   DIABETES:  See history of present illness for evaluation of  current management, blood sugar patterns and problems identified  Her A1c is 6.6 and relatively adequate now  This is despite her fasting readings being consistently high above 130 recently More recently has started back on regular exercise program but has not lost any weight She is usually trying to watch her diet also Not clear if she is benefiting from increasing her Ozempic to 1 mg weekly For now we will continue the same regimen and be consistent with exercise and diet regimen  Hypothyroidism, post ablative:  Now has TSH levels back to normal with 200 mcg  HYPERTENSION: Her blood pressure is low standing up in the office today and recommend that she leave off the 0.05 mg clonidine in the morning for now To follow-up with cardiologist  History of hypercalcemia: Again etiology unclear and since her calcium is upper normal we will continue to watch, no change in vitamin D supplement needed  There are no Patient Instructions on file for this visit.  Total visit time for evaluation and management of multiple problems and counseling =25 minutes  .  Elayne Snare 07/30/2017, 3:44 PM   Note: This office note was prepared with Dragon voice recognition  system technology. Any transcriptional errors that result from this process are unintentional.

## 2017-07-31 ENCOUNTER — Encounter: Payer: Self-pay | Admitting: Endocrinology

## 2017-07-31 DIAGNOSIS — H40053 Ocular hypertension, bilateral: Secondary | ICD-10-CM | POA: Diagnosis not present

## 2017-08-04 DIAGNOSIS — M955 Acquired deformity of pelvis: Secondary | ICD-10-CM | POA: Diagnosis not present

## 2017-08-04 DIAGNOSIS — M9903 Segmental and somatic dysfunction of lumbar region: Secondary | ICD-10-CM | POA: Diagnosis not present

## 2017-08-04 DIAGNOSIS — M9905 Segmental and somatic dysfunction of pelvic region: Secondary | ICD-10-CM | POA: Diagnosis not present

## 2017-08-04 DIAGNOSIS — M5136 Other intervertebral disc degeneration, lumbar region: Secondary | ICD-10-CM | POA: Diagnosis not present

## 2017-08-05 DIAGNOSIS — M9905 Segmental and somatic dysfunction of pelvic region: Secondary | ICD-10-CM | POA: Diagnosis not present

## 2017-08-05 DIAGNOSIS — M955 Acquired deformity of pelvis: Secondary | ICD-10-CM | POA: Diagnosis not present

## 2017-08-05 DIAGNOSIS — M5136 Other intervertebral disc degeneration, lumbar region: Secondary | ICD-10-CM | POA: Diagnosis not present

## 2017-08-05 DIAGNOSIS — M9903 Segmental and somatic dysfunction of lumbar region: Secondary | ICD-10-CM | POA: Diagnosis not present

## 2017-08-06 DIAGNOSIS — M9903 Segmental and somatic dysfunction of lumbar region: Secondary | ICD-10-CM | POA: Diagnosis not present

## 2017-08-06 DIAGNOSIS — M955 Acquired deformity of pelvis: Secondary | ICD-10-CM | POA: Diagnosis not present

## 2017-08-06 DIAGNOSIS — M5136 Other intervertebral disc degeneration, lumbar region: Secondary | ICD-10-CM | POA: Diagnosis not present

## 2017-08-06 DIAGNOSIS — M9905 Segmental and somatic dysfunction of pelvic region: Secondary | ICD-10-CM | POA: Diagnosis not present

## 2017-08-07 DIAGNOSIS — M9903 Segmental and somatic dysfunction of lumbar region: Secondary | ICD-10-CM | POA: Diagnosis not present

## 2017-08-07 DIAGNOSIS — M5136 Other intervertebral disc degeneration, lumbar region: Secondary | ICD-10-CM | POA: Diagnosis not present

## 2017-08-07 DIAGNOSIS — M955 Acquired deformity of pelvis: Secondary | ICD-10-CM | POA: Diagnosis not present

## 2017-08-07 DIAGNOSIS — M9905 Segmental and somatic dysfunction of pelvic region: Secondary | ICD-10-CM | POA: Diagnosis not present

## 2017-08-11 DIAGNOSIS — D485 Neoplasm of uncertain behavior of skin: Secondary | ICD-10-CM | POA: Diagnosis not present

## 2017-08-11 DIAGNOSIS — L7682 Other postprocedural complications of skin and subcutaneous tissue: Secondary | ICD-10-CM | POA: Diagnosis not present

## 2017-08-12 ENCOUNTER — Other Ambulatory Visit: Payer: Self-pay | Admitting: Internal Medicine

## 2017-08-12 DIAGNOSIS — M9905 Segmental and somatic dysfunction of pelvic region: Secondary | ICD-10-CM | POA: Diagnosis not present

## 2017-08-12 DIAGNOSIS — M955 Acquired deformity of pelvis: Secondary | ICD-10-CM | POA: Diagnosis not present

## 2017-08-12 DIAGNOSIS — M5136 Other intervertebral disc degeneration, lumbar region: Secondary | ICD-10-CM | POA: Diagnosis not present

## 2017-08-12 DIAGNOSIS — M9903 Segmental and somatic dysfunction of lumbar region: Secondary | ICD-10-CM | POA: Diagnosis not present

## 2017-08-13 DIAGNOSIS — M5136 Other intervertebral disc degeneration, lumbar region: Secondary | ICD-10-CM | POA: Diagnosis not present

## 2017-08-13 DIAGNOSIS — M9905 Segmental and somatic dysfunction of pelvic region: Secondary | ICD-10-CM | POA: Diagnosis not present

## 2017-08-13 DIAGNOSIS — M955 Acquired deformity of pelvis: Secondary | ICD-10-CM | POA: Diagnosis not present

## 2017-08-13 DIAGNOSIS — M9903 Segmental and somatic dysfunction of lumbar region: Secondary | ICD-10-CM | POA: Diagnosis not present

## 2017-08-14 ENCOUNTER — Other Ambulatory Visit: Payer: Self-pay | Admitting: Endocrinology

## 2017-08-14 DIAGNOSIS — M955 Acquired deformity of pelvis: Secondary | ICD-10-CM | POA: Diagnosis not present

## 2017-08-14 DIAGNOSIS — M9905 Segmental and somatic dysfunction of pelvic region: Secondary | ICD-10-CM | POA: Diagnosis not present

## 2017-08-14 DIAGNOSIS — M5136 Other intervertebral disc degeneration, lumbar region: Secondary | ICD-10-CM | POA: Diagnosis not present

## 2017-08-14 DIAGNOSIS — M9903 Segmental and somatic dysfunction of lumbar region: Secondary | ICD-10-CM | POA: Diagnosis not present

## 2017-08-15 ENCOUNTER — Ambulatory Visit (INDEPENDENT_AMBULATORY_CARE_PROVIDER_SITE_OTHER): Payer: BLUE CROSS/BLUE SHIELD | Admitting: Psychology

## 2017-08-15 ENCOUNTER — Other Ambulatory Visit: Payer: Self-pay | Admitting: Endocrinology

## 2017-08-15 DIAGNOSIS — F3131 Bipolar disorder, current episode depressed, mild: Secondary | ICD-10-CM | POA: Diagnosis not present

## 2017-08-18 DIAGNOSIS — M9905 Segmental and somatic dysfunction of pelvic region: Secondary | ICD-10-CM | POA: Diagnosis not present

## 2017-08-18 DIAGNOSIS — M955 Acquired deformity of pelvis: Secondary | ICD-10-CM | POA: Diagnosis not present

## 2017-08-18 DIAGNOSIS — D485 Neoplasm of uncertain behavior of skin: Secondary | ICD-10-CM | POA: Diagnosis not present

## 2017-08-18 DIAGNOSIS — M9903 Segmental and somatic dysfunction of lumbar region: Secondary | ICD-10-CM | POA: Diagnosis not present

## 2017-08-18 DIAGNOSIS — M5136 Other intervertebral disc degeneration, lumbar region: Secondary | ICD-10-CM | POA: Diagnosis not present

## 2017-08-20 DIAGNOSIS — M9905 Segmental and somatic dysfunction of pelvic region: Secondary | ICD-10-CM | POA: Diagnosis not present

## 2017-08-20 DIAGNOSIS — M955 Acquired deformity of pelvis: Secondary | ICD-10-CM | POA: Diagnosis not present

## 2017-08-20 DIAGNOSIS — M9903 Segmental and somatic dysfunction of lumbar region: Secondary | ICD-10-CM | POA: Diagnosis not present

## 2017-08-20 DIAGNOSIS — M5136 Other intervertebral disc degeneration, lumbar region: Secondary | ICD-10-CM | POA: Diagnosis not present

## 2017-08-21 ENCOUNTER — Other Ambulatory Visit: Payer: Self-pay | Admitting: Endocrinology

## 2017-08-21 DIAGNOSIS — M955 Acquired deformity of pelvis: Secondary | ICD-10-CM | POA: Diagnosis not present

## 2017-08-21 DIAGNOSIS — M5136 Other intervertebral disc degeneration, lumbar region: Secondary | ICD-10-CM | POA: Diagnosis not present

## 2017-08-21 DIAGNOSIS — M9903 Segmental and somatic dysfunction of lumbar region: Secondary | ICD-10-CM | POA: Diagnosis not present

## 2017-08-21 DIAGNOSIS — M9905 Segmental and somatic dysfunction of pelvic region: Secondary | ICD-10-CM | POA: Diagnosis not present

## 2017-08-24 ENCOUNTER — Other Ambulatory Visit: Payer: Self-pay | Admitting: Endocrinology

## 2017-08-26 DIAGNOSIS — M9903 Segmental and somatic dysfunction of lumbar region: Secondary | ICD-10-CM | POA: Diagnosis not present

## 2017-08-26 DIAGNOSIS — M955 Acquired deformity of pelvis: Secondary | ICD-10-CM | POA: Diagnosis not present

## 2017-08-26 DIAGNOSIS — M9905 Segmental and somatic dysfunction of pelvic region: Secondary | ICD-10-CM | POA: Diagnosis not present

## 2017-08-26 DIAGNOSIS — M5136 Other intervertebral disc degeneration, lumbar region: Secondary | ICD-10-CM | POA: Diagnosis not present

## 2017-08-27 ENCOUNTER — Encounter: Payer: Self-pay | Admitting: Family Medicine

## 2017-08-28 DIAGNOSIS — M9903 Segmental and somatic dysfunction of lumbar region: Secondary | ICD-10-CM | POA: Diagnosis not present

## 2017-08-28 DIAGNOSIS — M5136 Other intervertebral disc degeneration, lumbar region: Secondary | ICD-10-CM | POA: Diagnosis not present

## 2017-08-28 DIAGNOSIS — M9905 Segmental and somatic dysfunction of pelvic region: Secondary | ICD-10-CM | POA: Diagnosis not present

## 2017-08-28 DIAGNOSIS — M955 Acquired deformity of pelvis: Secondary | ICD-10-CM | POA: Diagnosis not present

## 2017-08-29 ENCOUNTER — Encounter: Payer: Self-pay | Admitting: Internal Medicine

## 2017-08-29 ENCOUNTER — Inpatient Hospital Stay: Payer: BLUE CROSS/BLUE SHIELD | Attending: Internal Medicine

## 2017-08-29 ENCOUNTER — Other Ambulatory Visit: Payer: Self-pay

## 2017-08-29 ENCOUNTER — Inpatient Hospital Stay: Payer: BLUE CROSS/BLUE SHIELD | Admitting: Internal Medicine

## 2017-08-29 VITALS — BP 138/95 | HR 83 | Temp 97.6°F | Resp 20 | Ht 66.0 in | Wt 215.0 lb

## 2017-08-29 DIAGNOSIS — Z9013 Acquired absence of bilateral breasts and nipples: Secondary | ICD-10-CM | POA: Diagnosis not present

## 2017-08-29 DIAGNOSIS — Z79899 Other long term (current) drug therapy: Secondary | ICD-10-CM | POA: Diagnosis not present

## 2017-08-29 DIAGNOSIS — Z170 Estrogen receptor positive status [ER+]: Secondary | ICD-10-CM

## 2017-08-29 DIAGNOSIS — I10 Essential (primary) hypertension: Secondary | ICD-10-CM | POA: Diagnosis not present

## 2017-08-29 DIAGNOSIS — Z87891 Personal history of nicotine dependence: Secondary | ICD-10-CM | POA: Diagnosis not present

## 2017-08-29 DIAGNOSIS — C50812 Malignant neoplasm of overlapping sites of left female breast: Secondary | ICD-10-CM | POA: Insufficient documentation

## 2017-08-29 DIAGNOSIS — E119 Type 2 diabetes mellitus without complications: Secondary | ICD-10-CM

## 2017-08-29 DIAGNOSIS — M8580 Other specified disorders of bone density and structure, unspecified site: Secondary | ICD-10-CM | POA: Diagnosis not present

## 2017-08-29 DIAGNOSIS — I110 Hypertensive heart disease with heart failure: Secondary | ICD-10-CM | POA: Insufficient documentation

## 2017-08-29 LAB — CBC WITH DIFFERENTIAL/PLATELET
Basophils Absolute: 0.1 10*3/uL (ref 0–0.1)
Basophils Relative: 1 %
Eosinophils Absolute: 0.3 10*3/uL (ref 0–0.7)
Eosinophils Relative: 4 %
HCT: 44.8 % (ref 35.0–47.0)
Hemoglobin: 14.8 g/dL (ref 12.0–16.0)
Lymphocytes Relative: 20 %
Lymphs Abs: 1.8 10*3/uL (ref 1.0–3.6)
MCH: 29.8 pg (ref 26.0–34.0)
MCHC: 33.1 g/dL (ref 32.0–36.0)
MCV: 90 fL (ref 80.0–100.0)
Monocytes Absolute: 0.7 10*3/uL (ref 0.2–0.9)
Monocytes Relative: 8 %
Neutro Abs: 6.2 10*3/uL (ref 1.4–6.5)
Neutrophils Relative %: 67 %
Platelets: 358 10*3/uL (ref 150–440)
RBC: 4.97 MIL/uL (ref 3.80–5.20)
RDW: 14 % (ref 11.5–14.5)
WBC: 9.2 10*3/uL (ref 3.6–11.0)

## 2017-08-29 LAB — COMPREHENSIVE METABOLIC PANEL
ALT: 31 U/L (ref 0–44)
AST: 27 U/L (ref 15–41)
Albumin: 4.4 g/dL (ref 3.5–5.0)
Alkaline Phosphatase: 115 U/L (ref 38–126)
Anion gap: 10 (ref 5–15)
BUN: 12 mg/dL (ref 6–20)
CO2: 22 mmol/L (ref 22–32)
Calcium: 9.7 mg/dL (ref 8.9–10.3)
Chloride: 105 mmol/L (ref 98–111)
Creatinine, Ser: 0.91 mg/dL (ref 0.44–1.00)
GFR calc Af Amer: >60 mL/min (ref >60)
GFR calc non Af Amer: >60 mL/min (ref >60)
Glucose, Bld: 130 mg/dL — ABNORMAL HIGH (ref 70–99)
Potassium: 3.9 mmol/L (ref 3.5–5.1)
Sodium: 137 mmol/L (ref 135–145)
Total Bilirubin: 0.9 mg/dL (ref 0.3–1.2)
Total Protein: 7.1 g/dL (ref 6.5–8.1)

## 2017-08-29 NOTE — Progress Notes (Signed)
[place] OFFICE PROGRESS NOTE  Patient Care Team: Tower, Wynelle Fanny, MD as PCP - General Rockey Situ, Kathlene November, MD as PCP - Cardiology (Cardiology) Elayne Snare, MD as Consulting Physician (Endocrinology) Chucky May, MD as Consulting Physician (Psychiatry) Laurey Morale, Wonda Cheng, MD (Obstetrics and Gynecology) Christene Lye, MD (General Surgery) Minna Merritts, MD as Consulting Physician (Cardiology)  Cancer Staging Carcinoma of left breast [facility]) Staging form: Breast, AJCC 7th Edition - Clinical: Stage IA (T1b, N0, M0) - Unsigned Staging comments: Kidney frequent changes in the 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  No invasive cancer in the right breast.  Status post bilateral mastectomy    Oncology History   1.  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  Patient underwent bilateral mastectomy  May 9 , 2016  Patient has invasive carcinoma and left breast with significant changes in the right breast ductal carcinoma in situ patient underwent bilateral mastectomy.  Left breast: T1 b N0 M0 stage IB estrogen and progesterone receptor HER-2/neu- Not overexpressed  2.    Multi-gene analysis with  MAMOPRINT low risk for recurrent disease Patient was started on letrozole and calcium and vitamin D (June, 2016)   # Hx Anxiety/depression [Dr.kaur; [place]]; Hx of colitis [s/p colo- May 2019]; Obesity -----------------------------------------------------   DIAGNOSIS:LEFT BREAST CA ER-POS  STAGE: I        ;GOALS: curative  CURRENT/MOST RECENT THERAPY: Letrozole      Carcinoma of left breast ([place])   05/23/2014 Initial Diagnosis    Carcinoma of left breast     Carcinoma of overlapping sites of left breast in  female, estrogen receptor positive ([place])      INTERVAL HISTORY:  Lindsay Fry 57 y.o.  female pleasant patient above history of stage I breast cancer ER PR positive HER-2 negative currently on letrozole is here for follow-up.  Patient denies any new onset of aches and pains.  She has chronic joint pains back pain.  Also has chronic mild hot flashes.  Chronic mild fatigue.  Mild diarrhea from her colitis.  She had a recent colonoscopy that was normal.   Review of Systems  Constitutional: Positive for malaise/fatigue. Negative for chills, diaphoresis, fever and weight loss.  HENT: Negative for nosebleeds and sore throat.   Eyes: Negative for double vision.  Respiratory: Negative for cough, hemoptysis, sputum production, shortness of breath and wheezing.   Cardiovascular: Negative for chest pain, palpitations, orthopnea and leg swelling.  Gastrointestinal: Positive for diarrhea. Negative for abdominal pain, blood in stool, constipation, heartburn, melena, nausea and vomiting.  Genitourinary: Negative for dysuria, frequency and urgency.  Musculoskeletal: Positive for back pain and joint pain.  Skin: Negative.  Negative for itching and rash.  Neurological: Negative for dizziness, tingling, focal weakness, weakness and headaches.  Endo/Heme/Allergies: Does not bruise/bleed easily.  Psychiatric/Behavioral: Positive for depression. The patient is nervous/anxious and has insomnia.       PAST MEDICAL HISTORY :  Past Medical History:  Diagnosis Date  . ADD (attention deficit disorder) 11/19/2012  . Allergic rhinitis, cause unspecified   . Anxiety state, unspecified   . Arthritis   . Asymptomatic postmenopausal status (age-related) (natural)   . Atypical hyperplasia of left breast 2000  . Benign neoplasm of  other and unspecified site of the digestive system   . Bipolar disorder ([place])   . Breast cancer ([place])    Left Breast 50m invasive CA left uiq, dcis left uoq and a lot ADH, ALH in  both breasts.  .Marland KitchenCAD (coronary artery disease)    a. 08/2014 NSTEMI/Cath: LM nl, LAD 50p, D1/D2 min irregs, LCX nl, OM1 40, LPDA nl, RI 99ost (2.037mvessel->med Rx), RCA nl, AM 60, nl EF; b. 11/2016 MV: EF 74%, no ischemia (performed 2/2 dyspnea & new inf TWI).  . Marland Kitchenarcinoma of left breast ([place]/19/2016  . Chronic diastolic CHF (congestive heart failure) ([facility]   a. 08/2014 Echo: EF 60-65%, Gr 1 DD, nl LA.  . Marland Kitchenlotting disorder ([facility] [place]   on blood thiner, Plavix  . Cyst    on Achilles tendon  . Depression   . Diabetes mellitus ([facility]   frank  . Dysuria   . Edema   . Family history of malignant neoplasm of gastrointestinal tract   . Fibromyalgia   . GERD (gastroesophageal reflux disease)   . Glaucoma    pre glaucoma  . Headache    migraines  . Hiatal hernia   . History of alcoholism ([facility]  . History of migraines   . History of ovarian cyst   . Insomnia, unspecified   . Myocardial infarction ([place]8-[place]16  . OCD (obsessive compulsive disorder)   . Osteopenia 09/25/2016   Femoral neck T -1.1  9/18  . Other screening mammogram   . Pure hypercholesterolemia   . Rosacea   . Subacute confusional state 11/19/2012  . Tubular adenoma of colon 01/10/11  . Ulcerative colitis, unspecified   . Unspecified asthma(493.90)   . Unspecified essential hypertension   . Unspecified hypothyroidism   . Unspecified vitamin D deficiency   . Vertigo     PAST SURGICAL HISTORY :   Past Surgical History:  Procedure Laterality Date  . APPENDECTOMY    . AXILLARY SENTINEL NODE BIOPSY Left 05/23/2014   Procedure: AXILLARY SENTINEL NODE BIOPSY;  Surgeon: SeChristene LyeMD;  Location: [facility] ORS;  Service: General;  Laterality: Left;  . BREAST BIOPSY  Aug 2000   on tamoxifen, atypical hyperplasia  . BREAST SURGERY Left Aug 2000   lumpectomy/ Dr CrSharlet Salina. BREAST SURGERY Bilateral 05/23/14   Mastectomy  . BUNIONECTOMY Right   . CARDIAC CATHETERIZATION N/A 09/05/2014   Procedure: Left Heart Cath and  Coronary Angiography;  Surgeon: MuWellington HampshireMD;  [facility];  Service: Cardiovascular;  Laterality: N/A;  . CARDIAC CATHETERIZATION  09-05-14  . CATARACT EXTRACTION W/ INTRAOCULAR LENS IMPLANT    . CATARACT EXTRACTION W/ INTRAOCULAR LENS IMPLANT Bilateral   . CATARACT EXTRACTION, BILATERAL    . CESAREAN SECTION  1996/1997   placenta previa, gest DM, pre-eclampsia  . cholecystect  1999  . CHOLECYSTECTOMY  1997   adhesions also  . COLONOSCOPY  01/2001   Ulcerative colitis  . COLONOSCOPY  04/2004   UC, polyp  . COLONOSCOPY  12/08   UC, no polyps  . DEXA  04/1999 and 2010   normal  . ESOPHAGOGASTRODUODENOSCOPY  09/2001   polyp  . exercise stress test  11/2003   negative  . FEMUR FRACTURE SURGERY Left   . MASTECTOMY Bilateral 05-23-14   Dr. SaJamal Collin. MECKEL DIVERTICULUM EXCISION  1999  . NASAL SINUS SURGERY  05/1997  . nuclear stress test  06/2007   negative  .  precancerous mole removed    . SIMPLE MASTECTOMY WITH AXILLARY SENTINEL NODE BIOPSY Bilateral 05/23/2014   Procedure: Bilateral simple mastectomy, left sentinel node biopsy ;  Surgeon: Christene Lye, MD;  Location: [facility] ORS;  Service: General;  Laterality: Bilateral;  . Sleep study  11/09   no apnea, but did snore (done by [facility])  . TONSILLECTOMY  1984  . TUBAL LIGATION    . WISDOM TOOTH EXTRACTION  1980's    FAMILY HISTORY :   Family History  Problem Relation Age of Onset  . Coronary artery disease Father   . Colon cancer Father   . Alzheimer's disease Father   . Dementia Father   . Coronary artery disease Mother   . Hypertension Mother   . Osteoporosis Mother   . Colitis Mother   . Crohn's disease Mother   . Breast cancer Unknown        great aunts  . Coronary artery disease Unknown        Uncle (also AAA)  . Diabetes Unknown        remote family history  . Stroke Cousin   . Esophageal cancer Neg Hx   . Rectal cancer Neg Hx   . Stomach cancer Neg Hx     SOCIAL HISTORY:    Social History   Tobacco Use  . Smoking status: Former Smoker    Packs/day: 0.25    Years: 5.00    Pack years: 1.25    Types: Cigarettes    Last attempt to quit: 01/14/1993    Years since quitting: 24.6  . Smokeless tobacco: Former Network engineer Use Topics  . Alcohol use: No    Alcohol/week: 0.0 standard drinks    Comment: Recovered ETOH  . Drug use: No    ALLERGIES:  is allergic to ephedrine; aspartame and phenylalanine; aspirin; crestor [rosuvastatin]; erythromycin; nsaids; other; and oxycodone.  MEDICATIONS:  Current Outpatient Medications  Medication Sig Dispense Refill  . amantadine (SYMMETREL) 100 MG capsule Take 100 mg by mouth 2 (two) times daily.    Marland Kitchen atorvastatin (LIPITOR) 80 MG tablet TAKE 1 TABLET(80 MG) BY MOUTH DAILY 90 tablet 3  . b complex vitamins capsule Take 1 capsule by mouth daily.    Marland Kitchen BAYER MICROLET LANCETS lancets USE AS DIRECTED TO CHECK BLOOD SUGAR TWICE DAILY 200 each 2  . Blood Glucose Monitoring Suppl (CONTOUR NEXT EZ MONITOR) w/Device KIT USE AS DIRECTED TO CHECK BLOOD SUGAR TWICE DAILY 1 kit 2  . Calcium Carb-Cholecalciferol (CALCIUM PLUS VITAMIN D3) 600-800 MG-UNIT TABS Take 1 tablet by mouth daily.    Marland Kitchen CARTIA XT 240 MG 24 hr capsule TAKE 1 CAPSULE(240 MG) BY MOUTH DAILY 90 capsule 3  . cloNIDine (CATAPRES) 0.1 MG tablet TAKE 1 TABLET(0.1 MG) BY MOUTH TWICE DAILY (Patient taking differently: Take 0.1 mg by mouth daily. ) 180 tablet 3  . clopidogrel (PLAVIX) 75 MG tablet Take 1 tablet (75 mg total) by mouth daily. 90 tablet 3  . co-enzyme Q-10 50 MG capsule Take 50 mg by mouth daily.    Marland Kitchen FIBER SELECT GUMMIES PO Take 3 capsules by mouth daily.    Marland Kitchen gabapentin (NEURONTIN) 300 MG capsule TAKE 1 CAPSULE(300 MG) BY MOUTH THREE TIMES DAILY 90 capsule 11  . glucose blood (BAYER CONTOUR NEXT TEST) test strip USE AS DIRECTED TO CHECK BLOOD SUGAR TWICE DAILY 200 each 2  . Insulin Detemir (LEVEMIR FLEXTOUCH) 100 UNIT/ML Pen INJECT 48 UNITS UNDER THE SKIN  DAILY AT  10 PM 45 mL 1  . Insulin Pen Needle (B-D UF III MINI PEN NEEDLES) 31G X 5 MM MISC USE FOR INJECTIONS TWICE DAILY 100 each 0  . INVOKANA 300 MG TABS tablet TAKE 1 TABLET(300 MG) BY MOUTH DAILY BEFORE BREAKFAST 30 tablet 0  . lamoTRIgine (LAMICTAL) 200 MG tablet Take 400 mg by mouth daily.    Marland Kitchen latanoprost (XALATAN) 0.005 % ophthalmic solution Place 1 drop into both eyes At bedtime.    Marland Kitchen letrozole (FEMARA) 2.5 MG tablet Take 1 tablet (2.5 mg total) by mouth daily. 30 tablet 12  . levothyroxine (SYNTHROID, LEVOTHROID) 200 MCG tablet TAKE 1 TABLET(200 MCG) BY MOUTH DAILY BEFORE BREAKFAST 30 tablet 0  . linaclotide (LINZESS) 290 MCG CAPS capsule Take 1 capsule (290 mcg total) by mouth daily before breakfast. 30 capsule 3  . lisdexamfetamine (VYVANSE) 40 MG capsule Take 40 mg by mouth every morning.    . lithium carbonate (ESKALITH) 450 MG CR tablet Take 900 mg by mouth at bedtime.    . Melatonin 10 MG CAPS Take 1 capsule by mouth at bedtime.    . mesalamine (LIALDA) 1.2 g EC tablet Take 2 tablets (2.4 g total) by mouth daily. 60 tablet 11  . metoprolol succinate (TOPROL-XL) 50 MG 24 hr tablet Take 1 tablet (50 mg total) by mouth every evening. Take with or immediately following a meal. 90 tablet 3  . Multiple Vitamin (MULTIVITAMIN) tablet Take 1 tablet by mouth daily.      . Omega-3 Krill Oil 500 MG CAPS Take 500 mg by mouth.    . ondansetron (ZOFRAN ODT) 4 MG disintegrating tablet Take 1 each morning then every 6 hours for nausea. 120 tablet 2  . OZEMPIC 1 MG/DOSE SOPN INJECT 1 MG UNDER THE SKIN ONCE A WEEK 4 pen 3  . pantoprazole (PROTONIX) 40 MG tablet TAKE 1 TABLET BY MOUTH TWICE DAILY 60 tablet 0  . Probiotic Product (PROBIOTIC DAILY PO) Take by mouth.    . QUEtiapine (SEROQUEL XR) 400 MG 24 hr tablet Take 1 tablet by mouth at bedtime.  1  . ranitidine (ZANTAC) 300 MG tablet Take 0.5 tablets (150 mg total) by mouth 2 (two) times daily. 60 tablet 3  . Simethicone (GAS-X PO) Take 1 tablet  by mouth 4 (four) times daily.    . valACYclovir (VALTREX) 500 MG tablet TAKE 1 TABLET(500 MG) once daily by mouth 30 tablet 11  . vitamin C (ASCORBIC ACID) 500 MG tablet Take 250 mg by mouth daily. Takes 2 a day    . zaleplon (SONATA) 10 MG capsule Take 20 mg by mouth at bedtime as needed for sleep.    . chlorproMAZINE (THORAZINE) 25 MG tablet Take 25-50 mg by mouth as needed. Reported on 03/02/2015    . diazepam (VALIUM) 10 MG tablet Take 2.5-10 mg by mouth as needed for anxiety.    . diphenoxylate-atropine (LOMOTIL) 2.5-0.025 MG tablet Take 1-2 tablets by mouth 3 times a day as needed for diarrhea (Patient not taking: Reported on 08/29/2017) 90 tablet 0  . nitrofurantoin (MACRODANTIN) 100 MG capsule Take 100 mg by mouth as directed. Reported on 03/02/2015     Current Facility-Administered Medications  Medication Dose Route Frequency Provider Last Rate Last Dose  . 0.9 %  sodium chloride infusion  500 mL Intravenous Continuous Irene Shipper, MD      . 0.9 %  sodium chloride infusion  500 mL Intravenous Once Irene Shipper, MD  PHYSICAL EXAMINATION: ECOG PERFORMANCE STATUS: 0 - Asymptomatic  BP (!) 138/95 (BP Location: Left Arm, Patient Position: Sitting)   Pulse 83   Temp 97.6 F (36.4 C) (Tympanic)   Resp 20   Ht 5' 6"  (1.676 m)   Wt 215 lb (97.5 kg)   LMP 08/23/2006 (Approximate) Comment: tubal ligation  BMI 34.70 kg/m   Filed Weights   08/29/17 1412  Weight: 215 lb (97.5 kg)    Physical Exam  Constitutional: She is oriented to person, place, and time and well-developed, well-nourished, and in no distress.  HENT:  Head: Normocephalic and atraumatic.  Mouth/Throat: Oropharynx is clear and moist. No oropharyngeal exudate.  Eyes: Pupils are equal, round, and reactive to light.  Neck: Normal range of motion. Neck supple.  Cardiovascular: Normal rate and regular rhythm.  Pulmonary/Chest: No respiratory distress. She has no wheezes.  Abdominal: Soft. Bowel sounds are  normal. She exhibits no distension and no mass. There is no tenderness. There is no rebound and no guarding.  Musculoskeletal: Normal range of motion. She exhibits no edema or tenderness.  Neurological: She is alert and oriented to person, place, and time.  Skin: Skin is warm.  Bilateral mastectomy noted.  No lumps or bumps.  Psychiatric: Affect normal.       LABORATORY DATA:  I have reviewed the data as listed    Component Value Date/Time   NA 137 08/29/2017 1352   K 3.9 08/29/2017 1352   CL 105 08/29/2017 1352   CO2 22 08/29/2017 1352   GLUCOSE 130 (H) 08/29/2017 1352   BUN 12 08/29/2017 1352   CREATININE 0.91 08/29/2017 1352   CALCIUM 9.7 08/29/2017 1352   PROT 7.1 08/29/2017 1352   ALBUMIN 4.4 08/29/2017 1352   AST 27 08/29/2017 1352   ALT 31 08/29/2017 1352   ALKPHOS 115 08/29/2017 1352   BILITOT 0.9 08/29/2017 1352   GFRNONAA >60 08/29/2017 1352   GFRAA >60 08/29/2017 1352    No results found for: SPEP, UPEP  Lab Results  Component Value Date   WBC 9.2 08/29/2017   NEUTROABS 6.2 08/29/2017   HGB 14.8 08/29/2017   HCT 44.8 08/29/2017   MCV 90.0 08/29/2017   PLT 358 08/29/2017      Chemistry      Component Value Date/Time   NA 137 08/29/2017 1352   K 3.9 08/29/2017 1352   CL 105 08/29/2017 1352   CO2 22 08/29/2017 1352   BUN 12 08/29/2017 1352   CREATININE 0.91 08/29/2017 1352      Component Value Date/Time   CALCIUM 9.7 08/29/2017 1352   ALKPHOS 115 08/29/2017 1352   AST 27 08/29/2017 1352   ALT 31 08/29/2017 1352   BILITOT 0.9 08/29/2017 1352       RADIOGRAPHIC STUDIES: I have personally reviewed the radiological images as listed and agreed with the findings in the report. No results found.   ASSESSMENT & PLAN:  Carcinoma of overlapping sites of left breast in female, estrogen receptor positive ([place]) # LEFT Breast Stage I s/p mastec [prophy right mastec] on Low risk mammoprint- on Letrozle. NED [until summer 2021].  Stable.  Clinically no  evidence of recurrence.  # Ulcerative colitis/ polyps- s/p colonoscopy- april 2018 [Dr.Perry; [place]]; s/p  colonoscopy May 2019.  Stable.  # Arthritis- sec to AI.  Stable.  # mom- no cancer; mat great aunts/breast cancer; dad- all sons; no siblings; refer [place].  # BMD- Sep 2018- osteopenia. Con ca+vit D.  Stable.  #  follow up in 6 months/labs.   Dr. Levonne Spiller- Pscychiatrist; [place].    No orders of the defined types were placed in this encounter.  All questions were answered. The patient knows to call the clinic with any problems, questions or concerns.      Cammie Sickle, MD 09/02/2017 11:21 AM

## 2017-08-29 NOTE — Assessment & Plan Note (Addendum)
#   LEFT Breast Stage I s/p mastec [prophy right mastec] on Low risk mammoprint- on Letrozle. NED [until summer 2021].  Stable.  Clinically no evidence of recurrence.  # Ulcerative colitis/ polyps- s/p colonoscopy- april 2018 [Dr.Perry; [place]]; s/p  colonoscopy May 2019.  Stable.  # Arthritis- sec to AI.  Stable.  # mom- no cancer; mat great aunts/breast cancer; dad- all sons; no siblings; refer [place].  # BMD- Sep 2018- osteopenia. Con ca+vit D.  Stable.  # follow up in 6 months/labs.   Dr. Levonne Spiller- Pscychiatrist; [place].

## 2017-09-02 DIAGNOSIS — M955 Acquired deformity of pelvis: Secondary | ICD-10-CM | POA: Diagnosis not present

## 2017-09-02 DIAGNOSIS — M9905 Segmental and somatic dysfunction of pelvic region: Secondary | ICD-10-CM | POA: Diagnosis not present

## 2017-09-02 DIAGNOSIS — M9903 Segmental and somatic dysfunction of lumbar region: Secondary | ICD-10-CM | POA: Diagnosis not present

## 2017-09-02 DIAGNOSIS — M5136 Other intervertebral disc degeneration, lumbar region: Secondary | ICD-10-CM | POA: Diagnosis not present

## 2017-09-04 ENCOUNTER — Ambulatory Visit (INDEPENDENT_AMBULATORY_CARE_PROVIDER_SITE_OTHER): Payer: BLUE CROSS/BLUE SHIELD | Admitting: Psychology

## 2017-09-04 DIAGNOSIS — M5136 Other intervertebral disc degeneration, lumbar region: Secondary | ICD-10-CM | POA: Diagnosis not present

## 2017-09-04 DIAGNOSIS — M9905 Segmental and somatic dysfunction of pelvic region: Secondary | ICD-10-CM | POA: Diagnosis not present

## 2017-09-04 DIAGNOSIS — F3131 Bipolar disorder, current episode depressed, mild: Secondary | ICD-10-CM | POA: Diagnosis not present

## 2017-09-04 DIAGNOSIS — M9903 Segmental and somatic dysfunction of lumbar region: Secondary | ICD-10-CM | POA: Diagnosis not present

## 2017-09-04 DIAGNOSIS — M955 Acquired deformity of pelvis: Secondary | ICD-10-CM | POA: Diagnosis not present

## 2017-09-05 DIAGNOSIS — F3131 Bipolar disorder, current episode depressed, mild: Secondary | ICD-10-CM | POA: Diagnosis not present

## 2017-09-05 DIAGNOSIS — F3111 Bipolar disorder, current episode manic without psychotic features, mild: Secondary | ICD-10-CM | POA: Diagnosis not present

## 2017-09-09 DIAGNOSIS — M9905 Segmental and somatic dysfunction of pelvic region: Secondary | ICD-10-CM | POA: Diagnosis not present

## 2017-09-09 DIAGNOSIS — M9903 Segmental and somatic dysfunction of lumbar region: Secondary | ICD-10-CM | POA: Diagnosis not present

## 2017-09-09 DIAGNOSIS — M5136 Other intervertebral disc degeneration, lumbar region: Secondary | ICD-10-CM | POA: Diagnosis not present

## 2017-09-09 DIAGNOSIS — M955 Acquired deformity of pelvis: Secondary | ICD-10-CM | POA: Diagnosis not present

## 2017-09-10 ENCOUNTER — Ambulatory Visit: Payer: BLUE CROSS/BLUE SHIELD | Admitting: Psychology

## 2017-09-12 ENCOUNTER — Other Ambulatory Visit: Payer: Self-pay | Admitting: Internal Medicine

## 2017-09-12 DIAGNOSIS — M5136 Other intervertebral disc degeneration, lumbar region: Secondary | ICD-10-CM | POA: Diagnosis not present

## 2017-09-12 DIAGNOSIS — M9905 Segmental and somatic dysfunction of pelvic region: Secondary | ICD-10-CM | POA: Diagnosis not present

## 2017-09-12 DIAGNOSIS — M9903 Segmental and somatic dysfunction of lumbar region: Secondary | ICD-10-CM | POA: Diagnosis not present

## 2017-09-12 DIAGNOSIS — M955 Acquired deformity of pelvis: Secondary | ICD-10-CM | POA: Diagnosis not present

## 2017-09-16 DIAGNOSIS — M9903 Segmental and somatic dysfunction of lumbar region: Secondary | ICD-10-CM | POA: Diagnosis not present

## 2017-09-16 DIAGNOSIS — M955 Acquired deformity of pelvis: Secondary | ICD-10-CM | POA: Diagnosis not present

## 2017-09-16 DIAGNOSIS — M9905 Segmental and somatic dysfunction of pelvic region: Secondary | ICD-10-CM | POA: Diagnosis not present

## 2017-09-16 DIAGNOSIS — M5136 Other intervertebral disc degeneration, lumbar region: Secondary | ICD-10-CM | POA: Diagnosis not present

## 2017-09-18 ENCOUNTER — Other Ambulatory Visit: Payer: Self-pay | Admitting: Internal Medicine

## 2017-09-18 ENCOUNTER — Other Ambulatory Visit: Payer: Self-pay | Admitting: Endocrinology

## 2017-09-19 DIAGNOSIS — M5136 Other intervertebral disc degeneration, lumbar region: Secondary | ICD-10-CM | POA: Diagnosis not present

## 2017-09-19 DIAGNOSIS — M9903 Segmental and somatic dysfunction of lumbar region: Secondary | ICD-10-CM | POA: Diagnosis not present

## 2017-09-19 DIAGNOSIS — M955 Acquired deformity of pelvis: Secondary | ICD-10-CM | POA: Diagnosis not present

## 2017-09-19 DIAGNOSIS — M9905 Segmental and somatic dysfunction of pelvic region: Secondary | ICD-10-CM | POA: Diagnosis not present

## 2017-09-22 ENCOUNTER — Other Ambulatory Visit: Payer: Self-pay | Admitting: Internal Medicine

## 2017-09-23 MED ORDER — SEMAGLUTIDE (1 MG/DOSE) 2 MG/1.5ML ~~LOC~~ SOPN: 1.0000 mg | PEN_INJECTOR | SUBCUTANEOUS | 3 refills | Status: DC

## 2017-09-24 DIAGNOSIS — M955 Acquired deformity of pelvis: Secondary | ICD-10-CM | POA: Diagnosis not present

## 2017-09-24 DIAGNOSIS — M9905 Segmental and somatic dysfunction of pelvic region: Secondary | ICD-10-CM | POA: Diagnosis not present

## 2017-09-24 DIAGNOSIS — M9903 Segmental and somatic dysfunction of lumbar region: Secondary | ICD-10-CM | POA: Diagnosis not present

## 2017-09-24 DIAGNOSIS — M5136 Other intervertebral disc degeneration, lumbar region: Secondary | ICD-10-CM | POA: Diagnosis not present

## 2017-09-25 DIAGNOSIS — M9903 Segmental and somatic dysfunction of lumbar region: Secondary | ICD-10-CM | POA: Diagnosis not present

## 2017-09-25 DIAGNOSIS — M9905 Segmental and somatic dysfunction of pelvic region: Secondary | ICD-10-CM | POA: Diagnosis not present

## 2017-09-25 DIAGNOSIS — M955 Acquired deformity of pelvis: Secondary | ICD-10-CM | POA: Diagnosis not present

## 2017-09-25 DIAGNOSIS — M5136 Other intervertebral disc degeneration, lumbar region: Secondary | ICD-10-CM | POA: Diagnosis not present

## 2017-09-26 ENCOUNTER — Telehealth: Payer: Self-pay | Admitting: Cardiovascular Disease

## 2017-09-26 NOTE — Telephone Encounter (Signed)
lmov to reschedule 10/24 [place] appt .  Change in provider schedule.

## 2017-09-30 NOTE — Telephone Encounter (Signed)
lmov for patient to call back

## 2017-10-01 DIAGNOSIS — M9905 Segmental and somatic dysfunction of pelvic region: Secondary | ICD-10-CM | POA: Diagnosis not present

## 2017-10-01 DIAGNOSIS — M5136 Other intervertebral disc degeneration, lumbar region: Secondary | ICD-10-CM | POA: Diagnosis not present

## 2017-10-01 DIAGNOSIS — C50111 Malignant neoplasm of central portion of right female breast: Secondary | ICD-10-CM | POA: Diagnosis not present

## 2017-10-01 DIAGNOSIS — M9903 Segmental and somatic dysfunction of lumbar region: Secondary | ICD-10-CM | POA: Diagnosis not present

## 2017-10-01 DIAGNOSIS — Z9013 Acquired absence of bilateral breasts and nipples: Secondary | ICD-10-CM | POA: Diagnosis not present

## 2017-10-01 DIAGNOSIS — M955 Acquired deformity of pelvis: Secondary | ICD-10-CM | POA: Diagnosis not present

## 2017-10-03 DIAGNOSIS — M955 Acquired deformity of pelvis: Secondary | ICD-10-CM | POA: Diagnosis not present

## 2017-10-03 DIAGNOSIS — M5136 Other intervertebral disc degeneration, lumbar region: Secondary | ICD-10-CM | POA: Diagnosis not present

## 2017-10-03 DIAGNOSIS — M9903 Segmental and somatic dysfunction of lumbar region: Secondary | ICD-10-CM | POA: Diagnosis not present

## 2017-10-03 DIAGNOSIS — M9905 Segmental and somatic dysfunction of pelvic region: Secondary | ICD-10-CM | POA: Diagnosis not present

## 2017-10-03 NOTE — Telephone Encounter (Signed)
lmov for patient to call back

## 2017-10-05 ENCOUNTER — Encounter: Payer: Self-pay | Admitting: Family Medicine

## 2017-10-06 NOTE — Telephone Encounter (Signed)
Patient has been rescheduled  Nothing else needed.

## 2017-10-07 ENCOUNTER — Ambulatory Visit: Payer: BLUE CROSS/BLUE SHIELD | Admitting: Psychology

## 2017-10-10 ENCOUNTER — Other Ambulatory Visit: Payer: Self-pay | Admitting: Endocrinology

## 2017-10-13 MED ORDER — VALACYCLOVIR HCL 1 G PO TABS: 1000.0000 mg | ORAL_TABLET | Freq: Every day | ORAL | 5 refills | Status: DC

## 2017-10-15 ENCOUNTER — Ambulatory Visit: Payer: BLUE CROSS/BLUE SHIELD | Admitting: Psychology

## 2017-10-18 ENCOUNTER — Other Ambulatory Visit: Payer: Self-pay | Admitting: Endocrinology

## 2017-10-20 DIAGNOSIS — M955 Acquired deformity of pelvis: Secondary | ICD-10-CM | POA: Diagnosis not present

## 2017-10-20 DIAGNOSIS — M5136 Other intervertebral disc degeneration, lumbar region: Secondary | ICD-10-CM | POA: Diagnosis not present

## 2017-10-20 DIAGNOSIS — M9905 Segmental and somatic dysfunction of pelvic region: Secondary | ICD-10-CM | POA: Diagnosis not present

## 2017-10-20 DIAGNOSIS — M9903 Segmental and somatic dysfunction of lumbar region: Secondary | ICD-10-CM | POA: Diagnosis not present

## 2017-10-28 ENCOUNTER — Encounter: Payer: Self-pay | Admitting: Internal Medicine

## 2017-10-28 ENCOUNTER — Ambulatory Visit: Payer: BLUE CROSS/BLUE SHIELD | Admitting: Internal Medicine

## 2017-10-28 VITALS — BP 100/64 | HR 88 | Temp 98.4°F | Resp 16 | Ht 66.0 in | Wt 216.2 lb

## 2017-10-28 DIAGNOSIS — M4722 Other spondylosis with radiculopathy, cervical region: Secondary | ICD-10-CM

## 2017-10-28 DIAGNOSIS — E1159 Type 2 diabetes mellitus with other circulatory complications: Secondary | ICD-10-CM

## 2017-10-28 DIAGNOSIS — M5010 Cervical disc disorder with radiculopathy, unspecified cervical region: Secondary | ICD-10-CM

## 2017-10-28 DIAGNOSIS — M25512 Pain in left shoulder: Secondary | ICD-10-CM | POA: Diagnosis not present

## 2017-10-28 MED ORDER — PREDNISONE 10 MG PO TABS: ORAL_TABLET | ORAL | 0 refills | Status: DC

## 2017-10-28 NOTE — Patient Instructions (Signed)
I have ordered neck and shoulder films to to do tomorrow at [facility]  Prednisone taper for your inflammation  Continue using valium for muscle spasm  Ok to add tylenol up to 2000 mg daily in divided doses   I believe your pain may be coming from a pinched nerve in your neck,  So you may require an MRI if symptoms persist   You can use the sling if it alleviates your pain

## 2017-10-28 NOTE — Progress Notes (Signed)
Subjective:  Patient ID: Lindsay Fry, female    DOB: 08-11-1960  Age: 57 y.o. MRN: [id number]  CC: The primary encounter diagnosis was Cervical disc disorder with radiculopathy. Diagnoses of Acute pain of left shoulder, Cervical radiculopathy due to degenerative joint disease of spine, and Type 2 diabetes mellitus with other circulatory complication, unspecified whether long term insulin use ([place]) were also pertinent to this visit.  HPI Lindsay Fry presents for urgent evaluation of shoulder pain ,  Left side .  Started as a twinge in the trapezius muscle 2 weeks ago,  Last night became sharp, accmpanied by a popping sound after laterally abducting and externally rotating arm to  pick up her purse from the floor while seated. The pain shot from the base of her neck to the scapula.    The pain Improved with valiun and aleve.  She has no pain if she sits still.  Pain is elicited  with use of arm and with turning head to the right   History of left sided breast cancer ,  Kyphosis and scoliosis     Outpatient Medications Prior to Visit  Medication Sig Dispense Refill  . amantadine (SYMMETREL) 100 MG capsule Take 100 mg by mouth 2 (two) times daily.    Marland Kitchen atorvastatin (LIPITOR) 80 MG tablet TAKE 1 TABLET(80 MG) BY MOUTH DAILY 90 tablet 3  . b complex vitamins capsule Take 1 capsule by mouth daily.    . B-D UF III MINI PEN NEEDLES 31G X 5 MM MISC USE FOR INJECTIONS TWICE DAILY 100 each 0  . BAYER MICROLET LANCETS lancets USE AS DIRECTED TO CHECK BLOOD SUGAR TWICE DAILY 200 each 2  . Blood Glucose Monitoring Suppl (CONTOUR NEXT EZ MONITOR) w/Device KIT USE AS DIRECTED TO CHECK BLOOD SUGAR TWICE DAILY 1 kit 2  . Calcium Carb-Cholecalciferol (CALCIUM PLUS VITAMIN D3) 600-800 MG-UNIT TABS Take 1 tablet by mouth daily.    Marland Kitchen CARTIA XT 240 MG 24 hr capsule TAKE 1 CAPSULE(240 MG) BY MOUTH DAILY 90 capsule 3  . chlorproMAZINE (THORAZINE) 25 MG tablet Take 25-50 mg by mouth as needed. Reported on  03/02/2015    . cloNIDine (CATAPRES) 0.1 MG tablet TAKE 1 TABLET(0.1 MG) BY MOUTH TWICE DAILY (Patient taking differently: Take 0.1 mg by mouth daily. ) 180 tablet 3  . clopidogrel (PLAVIX) 75 MG tablet Take 1 tablet (75 mg total) by mouth daily. 90 tablet 3  . co-enzyme Q-10 50 MG capsule Take 50 mg by mouth daily.    . diazepam (VALIUM) 10 MG tablet Take 2.5-10 mg by mouth as needed for anxiety.    . diphenoxylate-atropine (LOMOTIL) 2.5-0.025 MG tablet Take 1-2 tablets by mouth 3 times a day as needed for diarrhea 90 tablet 0  . FIBER SELECT GUMMIES PO Take 3 capsules by mouth daily.    Marland Kitchen gabapentin (NEURONTIN) 300 MG capsule TAKE 1 CAPSULE(300 MG) BY MOUTH THREE TIMES DAILY 90 capsule 11  . glucose blood (BAYER CONTOUR NEXT TEST) test strip USE AS DIRECTED TO CHECK BLOOD SUGAR TWICE DAILY 200 each 2  . Insulin Detemir (LEVEMIR FLEXTOUCH) 100 UNIT/ML Pen INJECT 48 UNITS UNDER THE SKIN DAILY AT 10 PM 45 mL 1  . Insulin Pen Needle (B-D UF III MINI PEN NEEDLES) 31G X 5 MM MISC USE FOR INJECTIONS TWICE DAILY 100 each 0  . INVOKANA 300 MG TABS tablet TAKE 1 TABLET(300 MG) BY MOUTH DAILY BEFORE BREAKFAST 30 tablet 0  . lamoTRIgine (LAMICTAL) 200 MG  tablet Take 400 mg by mouth daily.    Marland Kitchen latanoprost (XALATAN) 0.005 % ophthalmic solution Place 1 drop into both eyes At bedtime.    Marland Kitchen letrozole (FEMARA) 2.5 MG tablet Take 1 tablet (2.5 mg total) by mouth daily. 30 tablet 12  . levothyroxine (SYNTHROID, LEVOTHROID) 200 MCG tablet TAKE 1 TABLET(200 MCG) BY MOUTH DAILY BEFORE BREAKFAST 30 tablet 0  . LINZESS 290 MCG CAPS capsule TAKE 1 CAPSULE(290 MCG) BY MOUTH DAILY BEFORE BREAKFAST 30 capsule 6  . lithium carbonate (ESKALITH) 450 MG CR tablet Take 900 mg by mouth at bedtime.    . Melatonin 10 MG CAPS Take 1 capsule by mouth at bedtime.    . mesalamine (LIALDA) 1.2 g EC tablet TAKE 2 TABLETS BY MOUTH EVERY DAY 60 tablet 2  . metoprolol succinate (TOPROL-XL) 50 MG 24 hr tablet TK 1 T PO QPM WITH OR IMM  FOLLOWING A MEAL  3  . Multiple Vitamin (MULTIVITAMIN) tablet Take 1 tablet by mouth daily.      . nitrofurantoin (MACRODANTIN) 100 MG capsule Take 100 mg by mouth as directed. Reported on 03/02/2015    . Omega-3 Krill Oil 500 MG CAPS Take 500 mg by mouth.    . ondansetron (ZOFRAN ODT) 4 MG disintegrating tablet Take 1 each morning then every 6 hours for nausea. 120 tablet 2  . pantoprazole (PROTONIX) 40 MG tablet Take 1 tablet (40 mg total) by mouth 2 (two) times daily. 60 tablet 3  . Probiotic Product (PROBIOTIC DAILY PO) Take by mouth.    . QUEtiapine (SEROQUEL XR) 300 MG 24 hr tablet TK 2 TS PO QHS  12  . ranitidine (ZANTAC) 300 MG tablet Take 0.5 tablets (150 mg total) by mouth 2 (two) times daily. 60 tablet 3  . Semaglutide (OZEMPIC) 1 MG/DOSE SOPN Inject 1 mg into the skin once a week. 4 pen 3  . Simethicone (GAS-X PO) Take 1 tablet by mouth 4 (four) times daily.    . valACYclovir (VALTREX) 1000 MG tablet Take 1 tablet (1,000 mg total) by mouth daily. 30 tablet 5  . vitamin C (ASCORBIC ACID) 500 MG tablet Take 250 mg by mouth daily. Takes 2 a day    . VYVANSE 60 MG capsule TK ONE C PO QAM  0  . zaleplon (SONATA) 10 MG capsule Take 20 mg by mouth at bedtime as needed for sleep.    Marland Kitchen lisdexamfetamine (VYVANSE) 40 MG capsule Take 40 mg by mouth every morning.     . metoprolol succinate (TOPROL-XL) 50 MG 24 hr tablet Take 1 tablet (50 mg total) by mouth every evening. Take with or immediately following a meal. 90 tablet 3  . QUEtiapine (SEROQUEL XR) 400 MG 24 hr tablet Take 1 tablet by mouth at bedtime.   1   Facility-Administered Medications Prior to Visit  Medication Dose Route Frequency Provider Last Rate Last Dose  . 0.9 %  sodium chloride infusion  500 mL Intravenous Continuous Irene Shipper, MD      . 0.9 %  sodium chloride infusion  500 mL Intravenous Once Irene Shipper, MD        Review of Systems;  Patient denies headache, fevers, malaise, unintentional weight loss, skin rash,  eye pain, sinus congestion and sinus pain, sore throat, dysphagia,  hemoptysis , cough, dyspnea, wheezing, chest pain, palpitations, orthopnea, edema, abdominal pain, nausea, melena, diarrhea, constipation, flank pain, dysuria, hematuria, urinary  Frequency, nocturia, numbness, tingling, seizures,  Focal weakness, Loss of  consciousness,  Tremor, insomnia, depression, anxiety, and suicidal ideation.      Objective:  BP 100/64 (BP Location: Left Arm, Patient Position: Sitting, Cuff Size: Normal)   Pulse 88   Temp 98.4 F (36.9 C) (Oral)   Resp 16   Ht _0  (1.676 m)   Wt 216 lb 3.2 oz (98.1 kg)   LMP 08/23/2006 (Approximate) Comment: tubal ligation  SpO2 93%   BMI 34.90 kg/m   BP Readings from Last 3 Encounters:  10/28/17 100/64  08/29/17 (!) 138/95  07/30/17 124/84    Wt Readings from Last 3 Encounters:  10/28/17 216 lb 3.2 oz (98.1 kg)  08/29/17 215 lb (97.5 kg)  07/30/17 215 lb (97.5 kg)    General appearance: alert, cooperative and appears stated age Ears: normal TM's and external ear canals both ears Throat: lips, mucosa, and tongue normal; teeth and gums normal Neck: no adenopathy, no carotid bruit, supple, symmetrical, trachea midline and thyroid not enlarged, symmetric, no tenderness/mass/nodules Back: symmetric, no curvature. ROM normal. No CVA tenderness. Lungs: clear to auscultation bilaterally Heart: regular rate and rhythm, S1, S2 normal, no murmur, click, rub or gallop Abdomen: soft, non-tender; bowel sounds normal; no masses,  no organomegaly Pulses: 2+ and symmetric Skin: Skin color, texture, turgor normal. No rashes or lesions Lymph nodes: Cervical, supraclavicular, and axillary nodes normal. MSK:  Bilateral hand grip 4+5,  Biceps/triceps  strength normal left arm.  No pain with passive abduction of arm    Lab Results  Component Value Date   HGBA1C 6.6 (H) 07/24/2017   HGBA1C 6.4 02/06/2017   HGBA1C 6.4 10/25/2016    Lab Results  Component Value Date     CREATININE 0.91 08/29/2017   CREATININE 0.88 07/24/2017   CREATININE 0.90 02/28/2017    Lab Results  Component Value Date   WBC 9.2 08/29/2017   HGB 14.8 08/29/2017   HCT 44.8 08/29/2017   PLT 358 08/29/2017   GLUCOSE 130 (H) 08/29/2017   CHOL 137 02/06/2017   TRIG 128.0 02/06/2017   HDL 41.90 02/06/2017   LDLDIRECT 67.0 11/10/2015   LDLCALC 69 02/06/2017   ALT 31 08/29/2017   AST 27 08/29/2017   NA 137 08/29/2017   K 3.9 08/29/2017   CL 105 08/29/2017   CREATININE 0.91 08/29/2017   BUN 12 08/29/2017   CO2 22 08/29/2017   TSH 2.29 07/24/2017   HGBA1C 6.6 (H) 07/24/2017   MICROALBUR 3.6 (H) 02/06/2017    Ct Head Wo Contrast  Result Date: 01/03/2017 CLINICAL DATA:  57 year old with an episode of generalized weakness 10 days ago which was associated with slurred speech and blurry vision (possible TIA). Personal history of left breast cancer in 2016. EXAM: CT HEAD WITHOUT CONTRAST TECHNIQUE: Contiguous axial images were obtained from the base of the skull through the vertex without intravenous contrast. COMPARISON:  None. FINDINGS: Brain: Moderate cortical atrophy and cerebellar atrophy. Ventricular system normal in size and appearance for age. Cavum septum pellucidum. No mass lesion. No midline shift. No acute hemorrhage or hematoma. No extra-axial fluid collections. No evidence of acute infarction. Vascular: Mild bilateral carotid siphon and left vertebral artery atherosclerosis. No hyperdense vessel. Skull: No skull fracture or other focal osseous abnormality involving the skull. Sinuses/Orbits: Visualized paranasal sinuses, bilateral mastoid air cells and bilateral middle ear cavities well-aerated. Visualized orbits and globes normal. Other: None. IMPRESSION: No acute or subacute intracranial abnormalities. Electronically Signed   By: Evangeline Dakin M.D.   On: 01/03/2017 09:30    Assessment &  Plan:   Problem List Items Addressed This Visit    Acute pain of left shoulder     Suspect referred pain from cervical spine,  But she has pain with external rotation of shoulder suggestive of rotator cuff muscle  injury,  Plain films to assess joint spaces      Relevant Orders   DG Shoulder Left   Cervical radiculopathy due to degenerative joint disease of spine    Suspected by history and exam..  Palin films ordered.       Relevant Medications   VYVANSE 60 MG capsule   QUEtiapine (SEROQUEL XR) 300 MG 24 hr tablet   DM (diabetes mellitus) (HCC) (Chronic)    Advised to increase Levemir dose by 4 units if morning CBGS rise over 160 while on prednisone        Other Visit Diagnoses    Cervical disc disorder with radiculopathy    -  Primary   Relevant Orders   DG Cervical Spine Complete      I have discontinued Alfredo Bach "[name]"'s lisdexamfetamine. I am also having her start on predniSONE. Additionally, I am having her maintain her chlorproMAZINE, nitrofurantoin, multivitamin, latanoprost, Simethicone (GAS-X PO), b complex vitamins, vitamin C, Probiotic Product (PROBIOTIC DAILY PO), lamoTRIgine, diazepam, Omega-3 Krill Oil, co-enzyme Q-10, ranitidine, Melatonin, CONTOUR NEXT EZ MONITOR, glucose blood, BAYER MICROLET LANCETS, lithium carbonate, ondansetron, diphenoxylate-atropine, amantadine, clopidogrel, cloNIDine, CARTIA XT, atorvastatin, letrozole, Insulin Detemir, Calcium Carb-Cholecalciferol, zaleplon, FIBER SELECT GUMMIES PO, gabapentin, Insulin Pen Needle, levothyroxine, pantoprazole, LINZESS, mesalamine, Semaglutide (1 MG/DOSE), B-D UF III MINI PEN NEEDLES, valACYclovir, INVOKANA, VYVANSE, metoprolol succinate, and QUEtiapine. We will continue to administer sodium chloride and sodium chloride.  Meds ordered this encounter  Medications  . predniSONE (DELTASONE) 10 MG tablet    Sig: 6 tablets on Day 1 , then reduce by 1 tablet daily until gone    Dispense:  21 tablet    Refill:  0    Medications Discontinued During This Encounter  Medication Reason  .  lisdexamfetamine (VYVANSE) 40 MG capsule Change in therapy  . QUEtiapine (SEROQUEL XR) 400 MG 24 hr tablet Change in therapy  . metoprolol succinate (TOPROL-XL) 50 MG 24 hr tablet Change in therapy    Follow-up: No follow-ups on file.   Crecencio Mc, MD

## 2017-10-28 NOTE — Assessment & Plan Note (Signed)
Suspected by history and exam..  Palin films ordered.

## 2017-10-28 NOTE — Assessment & Plan Note (Signed)
Advised to increase Levemir dose by 4 units if morning CBGS rise over 160 while on prednisone

## 2017-10-28 NOTE — Assessment & Plan Note (Signed)
Suspect referred pain from cervical spine,  But she has pain with external rotation of shoulder suggestive of rotator cuff muscle  injury,  Plain films to assess joint spaces

## 2017-10-29 ENCOUNTER — Ambulatory Visit (INDEPENDENT_AMBULATORY_CARE_PROVIDER_SITE_OTHER)
Admission: RE | Admit: 2017-10-29 | Discharge: 2017-10-29 | Disposition: A | Discharge status: Home / Self Care | Payer: BLUE CROSS/BLUE SHIELD | Source: Ambulatory Visit | Attending: Internal Medicine | Admitting: Internal Medicine

## 2017-10-29 DIAGNOSIS — M47812 Spondylosis without myelopathy or radiculopathy, cervical region: Secondary | ICD-10-CM | POA: Diagnosis not present

## 2017-10-29 DIAGNOSIS — M25512 Pain in left shoulder: Secondary | ICD-10-CM

## 2017-10-29 DIAGNOSIS — M5010 Cervical disc disorder with radiculopathy, unspecified cervical region: Secondary | ICD-10-CM | POA: Diagnosis not present

## 2017-10-29 DIAGNOSIS — M19012 Primary osteoarthritis, left shoulder: Secondary | ICD-10-CM | POA: Diagnosis not present

## 2017-10-31 DIAGNOSIS — F900 Attention-deficit hyperactivity disorder, predominantly inattentive type: Secondary | ICD-10-CM | POA: Diagnosis not present

## 2017-10-31 DIAGNOSIS — F3174 Bipolar disorder, in full remission, most recent episode manic: Secondary | ICD-10-CM | POA: Diagnosis not present

## 2017-10-31 DIAGNOSIS — F3176 Bipolar disorder, in full remission, most recent episode depressed: Secondary | ICD-10-CM | POA: Diagnosis not present

## 2017-11-03 ENCOUNTER — Ambulatory Visit: Payer: BLUE CROSS/BLUE SHIELD | Admitting: Psychology

## 2017-11-03 DIAGNOSIS — F3131 Bipolar disorder, current episode depressed, mild: Secondary | ICD-10-CM

## 2017-11-05 ENCOUNTER — Ambulatory Visit (INDEPENDENT_AMBULATORY_CARE_PROVIDER_SITE_OTHER): Payer: BLUE CROSS/BLUE SHIELD | Admitting: Family Medicine

## 2017-11-05 ENCOUNTER — Encounter: Payer: Self-pay | Admitting: Family Medicine

## 2017-11-05 ENCOUNTER — Ambulatory Visit (INDEPENDENT_AMBULATORY_CARE_PROVIDER_SITE_OTHER)
Admission: RE | Admit: 2017-11-05 | Discharge: 2017-11-05 | Disposition: A | Discharge status: Home / Self Care | Payer: BLUE CROSS/BLUE SHIELD | Source: Ambulatory Visit | Attending: Family Medicine | Admitting: Family Medicine

## 2017-11-05 VITALS — BP 130/92 | HR 78 | Temp 97.8°F | Ht 66.0 in | Wt 211.0 lb

## 2017-11-05 DIAGNOSIS — J929 Pleural plaque without asbestos: Secondary | ICD-10-CM

## 2017-11-05 DIAGNOSIS — M4722 Other spondylosis with radiculopathy, cervical region: Secondary | ICD-10-CM | POA: Diagnosis not present

## 2017-11-05 DIAGNOSIS — S80211A Abrasion, right knee, initial encounter: Secondary | ICD-10-CM | POA: Diagnosis not present

## 2017-11-05 DIAGNOSIS — K121 Other forms of stomatitis: Secondary | ICD-10-CM | POA: Insufficient documentation

## 2017-11-05 DIAGNOSIS — M25512 Pain in left shoulder: Secondary | ICD-10-CM | POA: Diagnosis not present

## 2017-11-05 DIAGNOSIS — W19XXXA Unspecified fall, initial encounter: Secondary | ICD-10-CM | POA: Insufficient documentation

## 2017-11-05 MED ORDER — TRIAMCINOLONE ACETONIDE 0.1 % MT PSTE: 1.0000 "application " | PASTE | Freq: Two times a day (BID) | OROMUCOSAL | 1 refills | Status: DC

## 2017-11-05 NOTE — Progress Notes (Signed)
Subjective:    Patient ID: Lindsay Fry, female    DOB: 1961/01/08, 57 y.o.   MRN: [id number]  HPI Here for f/u of neck Also fall this Monday -hitting knee and head Also blister on lip   Wt Readings from Last 3 Encounters:  11/05/17 211 lb (95.7 kg)  10/28/17 216 lb 3.2 oz (98.1 kg)  08/29/17 215 lb (97.5 kg)   34.06 kg/m   Was seen on 10/15 by Dr Derrel Nip for urgent eval of shoulder/trap pain  (pain from base of neck to scapula) - after twisting and lifting to pick up a purse  diag with suspected cervical radiculopathy (due to DJD of neck)  Xray ordered   Dg Cervical Spine Complete  Result Date: 10/30/2017 CLINICAL DATA:  Remote whiplash injury.  Pain. EXAM: CERVICAL SPINE - COMPLETE 4+ VIEW COMPARISON:  None. FINDINGS: Straightening, slight reversal of the normal cervical lordosis. Disc space narrowing at C5-6 and C6-7. Foraminal narrowing is worse on the LEFT at those levels. Asymmetric RIGHT pleural thickening. Negative odontoid. IMPRESSION: Spondylosis as described, worst at C5-6 and C6-7 Electronically Signed   By: Staci Righter M.D.   On: 10/30/2017 08:51   Dg Shoulder Left  Result Date: 10/30/2017 CLINICAL DATA:  Left shoulder pain. EXAM: LEFT SHOULDER - 2+ VIEW COMPARISON:  None. FINDINGS: AC joint degenerative changes are noted. No fracture or dislocation. No other acute abnormalities. IMPRESSION: AC joint degenerative changes.  No other acute abnormalities. Electronically Signed   By: Dorise Bullion III M.D   On: 10/30/2017 08:45    Spondylosis  AC joint deg changes  tx with prednisone 60 mg taper (it really helped!)  Valium for muscle spasm   R pleural thickening   Lip was swollen on Friday after getting dental work on L side on Thursday  A blister started on inside of lip on that side  Also sore on the side of her tongue  Hurts to eat (eating soft and bland things)  Looked like a white patch Not calming down  Used some blistex salve and  vaseline Drinking with straws   Fall on Monday  Was walking with a bag and cart at aquatic center- tripped on a ramp  Tell forward - onto R knee (bled) and hit her cheekbone (abrasion)  With plavix - she bleeds easily   Knee pain is minimal - gets stiff after sitting   Tdap 11/12   Patient Active Problem List   Diagnosis Date Noted  . Pleural thickening 11/05/2017  . Abrasion of right knee 11/05/2017  . Fall 11/05/2017  . Mouth ulcers 11/05/2017  . Cervical radiculopathy due to degenerative joint disease of spine 10/28/2017  . 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 Supraventricular tachycardia ([place]) 04/22/2017  . Osteopenia 09/25/2016  . Estrogen deficiency 05/30/2016  . Need for hepatitis C screening test 03/19/2016  . Screening for HIV (human immunodeficiency virus) 03/19/2016  . Carcinoma of overlapping sites of left breast in female, estrogen receptor positive ([place]) 08/21/2015  . Chronic constipation 01/18/2015  . NSTEMI (non-ST elevated myocardial infarction) ([place]) 09/06/2014  . CAD (coronary artery disease) 09/06/2014  . DM (diabetes mellitus) ([place]) 09/05/2014  . 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 Carcinoma of left breast ([place]) 06/02/2014  . Sinus tachycardia 04/27/2014  . Obesity 05/20/2013  . Bladder pain 02/17/2013  . ADD (attention deficit disorder) 11/19/2012  . Urinary frequency 08/05/2012  . Low back pain 08/05/2012  . 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 Chronic sinusitis  01/24/2012  . Vertigo, benign positional 01/24/2012  . Other screening mammogram 12/05/2010  . Routine general medical examination at a health care facility 10/14/2010  . FATIGUE 08/09/2009  . POSTMENOPAUSAL STATUS 06/22/2008  . Vitamin D deficiency 03/18/2008  . BENIGN NEOPLASM [place] SITE DIGESTIVE SYSTEM 01/09/2007  . HIATAL HERNIA 01/09/2007  . COLONIC POLYPS, ADENOMATOUS, HX OF 01/09/2007  . HYPERCHOLESTEROLEMIA 01/07/2007  . Bipolar disorder, unspecified  ([place]) 01/07/2007  . Bipolar I disorder, most recent episode depressed ([place]) 01/07/2007  . Essential hypertension 01/07/2007  . ALLERGIC RHINITIS 01/07/2007  . ASTHMA 01/07/2007  . GERD 01/07/2007  . Ulcerative colitis ([place]) 01/07/2007  . ACNE ROSACEA 01/07/2007  . MIGRAINES, HX OF 01/07/2007  . Fibromyalgia 10/30/2006  . INSOMNIA 10/30/2006   Past Medical History:  Diagnosis Date  . ADD (attention deficit disorder) 11/19/2012  . Allergic rhinitis, cause unspecified   . Anxiety state, unspecified   . Arthritis   . Asymptomatic postmenopausal status (age-related) (natural)   . Atypical hyperplasia of left breast 2000  . Benign neoplasm of other and unspecified site of the digestive system   . Bipolar disorder ([place])   . Breast cancer ([place])    Left Breast 74m invasive CA left uiq, dcis left uoq and a lot ADH, ALH in both breasts.  .Marland KitchenCAD (coronary artery disease)    a. 08/2014 NSTEMI/Cath: LM nl, LAD 50p, D1/D2 min irregs, LCX nl, OM1 40, LPDA nl, RI 99ost (2.040mvessel->med Rx), RCA nl, AM 60, nl EF; b. 11/2016 MV: EF 74%, no ischemia (performed 2/2 dyspnea & new inf TWI).  . Marland Kitchenarcinoma of left breast ([facility]5/19/2016  . Chronic diastolic CHF (congestive heart failure) ([facility]   a. 08/2014 Echo: EF 60-65%, Gr 1 DD, nl LA.  . Marland Kitchenlotting disorder ([facility]   on blood thiner, Plavix  . Cyst    on Achilles tendon  . Depression   . Diabetes mellitus ([facility]   frank  . Dysuria   . Edema   . Family history of malignant neoplasm of gastrointestinal tract   . Fibromyalgia   . GERD (gastroesophageal reflux disease)   . Glaucoma    pre glaucoma  . Headache    migraines  . Hiatal hernia   . History of alcoholism ([facility]  . History of migraines   . History of ovarian cyst   . Insomnia, unspecified   . Myocardial infarction ([facility]8-21-16  . OCD (obsessive compulsive disorder)   . Osteopenia 09/25/2016   Femoral neck T -1.1  9/18  . Other screening mammogram   . Pure hypercholesterolemia   .  Rosacea   . Subacute confusional state 11/19/2012  . Tubular adenoma of colon 01/10/11  . Ulcerative colitis, unspecified   . Unspecified asthma(493.90)   . Unspecified essential hypertension   . Unspecified hypothyroidism   . Unspecified vitamin D deficiency   . Vertigo    Past Surgical History:  Procedure Laterality Date  . APPENDECTOMY    . AXILLARY SENTINEL NODE BIOPSY Left 05/23/2014   Procedure: AXILLARY SENTINEL NODE BIOPSY;  Surgeon: SeChristene LyeMD;  Location: [facility] ORS;  Service: General;  Laterality: Left;  . BREAST BIOPSY  Aug 2000   on tamoxifen, atypical hyperplasia  . BREAST SURGERY Left Aug 2000   lumpectomy/ Dr CrSharlet Salina. BREAST SURGERY Bilateral 05/23/14   Mastectomy  . BUNIONECTOMY Right   . CARDIAC CATHETERIZATION N/A 09/05/2014   Procedure: Left Heart Cath and Coronary Angiography;  Surgeon: MuRogue Jury  Ferne Reus, MD;  Location: [facility];  Service: Cardiovascular;  Laterality: N/A;  . CARDIAC CATHETERIZATION  09-05-14  . CATARACT EXTRACTION W/ INTRAOCULAR LENS IMPLANT    . CATARACT EXTRACTION W/ INTRAOCULAR LENS IMPLANT Bilateral   . CATARACT EXTRACTION, BILATERAL    . CESAREAN SECTION  1996/1997   placenta previa, gest DM, pre-eclampsia  . cholecystect  1999  . CHOLECYSTECTOMY  1997   adhesions also  . COLONOSCOPY  01/2001   Ulcerative colitis  . COLONOSCOPY  04/2004   UC, polyp  . COLONOSCOPY  12/08   UC, no polyps  . DEXA  04/1999 and 2010   normal  . ESOPHAGOGASTRODUODENOSCOPY  09/2001   polyp  . exercise stress test  11/2003   negative  . FEMUR FRACTURE SURGERY Left   . MASTECTOMY Bilateral 05-23-14   Dr. Jamal Collin  . MECKEL DIVERTICULUM EXCISION  1999  . NASAL SINUS SURGERY  05/1997  . nuclear stress test  06/2007   negative  . precancerous mole removed    . SIMPLE MASTECTOMY WITH AXILLARY SENTINEL NODE BIOPSY Bilateral 05/23/2014   Procedure: Bilateral simple mastectomy, left sentinel node biopsy ;  Surgeon: Christene Lye, MD;   Location: [facility] ORS;  Service: General;  Laterality: Bilateral;  . Sleep study  11/09   no apnea, but did snore (done by [facility])  . TONSILLECTOMY  1984  . TUBAL LIGATION    . WISDOM TOOTH EXTRACTION  1980's   Social History   Tobacco Use  . Smoking status: Former Smoker    Packs/day: 0.25    Years: 5.00    Pack years: 1.25    Types: Cigarettes    Last attempt to quit: 01/14/1993    Years since quitting: 24.8  . Smokeless tobacco: Former Network engineer Use Topics  . Alcohol use: No    Alcohol/week: 0.0 standard drinks    Comment: Recovered ETOH  . Drug use: No   Family History  Problem Relation Age of Onset  . Coronary artery disease Father   . Colon cancer Father   . Alzheimer's disease Father   . Dementia Father   . Coronary artery disease Mother   . Hypertension Mother   . Osteoporosis Mother   . Colitis Mother   . Crohn's disease Mother   . Breast cancer Unknown        great aunts  . Coronary artery disease Unknown        Uncle (also AAA)  . Diabetes Unknown        remote family history  . Stroke Cousin   . Esophageal cancer Neg Hx   . Rectal cancer Neg Hx   . Stomach cancer Neg Hx    Allergies  Allergen Reactions  . Ephedrine Other (See Comments)    Pt becomes hyper  . Aspartame And Phenylalanine Nausea Only  . Aspirin     REACTION: aggrivates colitis  . Crestor [Rosuvastatin]     REACTION: increased lfts  . Erythromycin     REACTION: GI upset  . Nsaids     REACTION: aggrivate colitis  . Other Other (See Comments)    Unable to take due to history of ulcerative colitis  . Oxycodone Other (See Comments)    Panic Attack   Current Outpatient Medications on File Prior to Visit  Medication Sig Dispense Refill  . amantadine (SYMMETREL) 100 MG capsule Take 100 mg by mouth 2 (two) times daily.    Marland Kitchen atorvastatin (LIPITOR) 80  MG tablet TAKE 1 TABLET(80 MG) BY MOUTH DAILY 90 tablet 3  . b complex vitamins capsule Take 1 capsule by mouth daily.    . B-D UF  III MINI PEN NEEDLES 31G X 5 MM MISC USE FOR INJECTIONS TWICE DAILY 100 each 0  . BAYER MICROLET LANCETS lancets USE AS DIRECTED TO CHECK BLOOD SUGAR TWICE DAILY 200 each 2  . Blood Glucose Monitoring Suppl (CONTOUR NEXT EZ MONITOR) w/Device KIT USE AS DIRECTED TO CHECK BLOOD SUGAR TWICE DAILY 1 kit 2  . Calcium Carb-Cholecalciferol (CALCIUM PLUS VITAMIN D3) 600-800 MG-UNIT TABS Take 1 tablet by mouth daily.    Marland Kitchen CARTIA XT 240 MG 24 hr capsule TAKE 1 CAPSULE(240 MG) BY MOUTH DAILY 90 capsule 3  . chlorproMAZINE (THORAZINE) 25 MG tablet Take 25-50 mg by mouth as needed. Reported on 03/02/2015    . cloNIDine (CATAPRES) 0.1 MG tablet TAKE 1 TABLET(0.1 MG) BY MOUTH TWICE DAILY (Patient taking differently: Take 0.1 mg by mouth daily. ) 180 tablet 3  . clopidogrel (PLAVIX) 75 MG tablet Take 1 tablet (75 mg total) by mouth daily. 90 tablet 3  . co-enzyme Q-10 50 MG capsule Take 50 mg by mouth daily.    . diazepam (VALIUM) 10 MG tablet Take 2.5-10 mg by mouth as needed for anxiety.    . diphenoxylate-atropine (LOMOTIL) 2.5-0.025 MG tablet Take 1-2 tablets by mouth 3 times a day as needed for diarrhea 90 tablet 0  . FIBER SELECT GUMMIES PO Take 3 capsules by mouth daily.    Marland Kitchen gabapentin (NEURONTIN) 300 MG capsule TAKE 1 CAPSULE(300 MG) BY MOUTH THREE TIMES DAILY 90 capsule 11  . glucose blood (BAYER CONTOUR NEXT TEST) test strip USE AS DIRECTED TO CHECK BLOOD SUGAR TWICE DAILY 200 each 2  . Insulin Detemir (LEVEMIR FLEXTOUCH) 100 UNIT/ML Pen INJECT 48 UNITS UNDER THE SKIN DAILY AT 10 PM 45 mL 1  . Insulin Pen Needle (B-D UF III MINI PEN NEEDLES) 31G X 5 MM MISC USE FOR INJECTIONS TWICE DAILY 100 each 0  . INVOKANA 300 MG TABS tablet TAKE 1 TABLET(300 MG) BY MOUTH DAILY BEFORE BREAKFAST 30 tablet 0  . lamoTRIgine (LAMICTAL) 200 MG tablet Take 400 mg by mouth daily.    Marland Kitchen latanoprost (XALATAN) 0.005 % ophthalmic solution Place 1 drop into both eyes At bedtime.    Marland Kitchen letrozole (FEMARA) 2.5 MG tablet Take 1  tablet (2.5 mg total) by mouth daily. 30 tablet 12  . levothyroxine (SYNTHROID, LEVOTHROID) 200 MCG tablet TAKE 1 TABLET(200 MCG) BY MOUTH DAILY BEFORE BREAKFAST 30 tablet 0  . LINZESS 290 MCG CAPS capsule TAKE 1 CAPSULE(290 MCG) BY MOUTH DAILY BEFORE BREAKFAST 30 capsule 6  . lithium carbonate (ESKALITH) 450 MG CR tablet Take 900 mg by mouth at bedtime.    . Melatonin 10 MG CAPS Take 1 capsule by mouth at bedtime.    . mesalamine (LIALDA) 1.2 g EC tablet TAKE 2 TABLETS BY MOUTH EVERY DAY 60 tablet 2  . metoprolol succinate (TOPROL-XL) 50 MG 24 hr tablet TK 1 T PO QPM WITH OR IMM FOLLOWING A MEAL  3  . Multiple Vitamin (MULTIVITAMIN) tablet Take 1 tablet by mouth daily.      . nitrofurantoin (MACRODANTIN) 100 MG capsule Take 100 mg by mouth as directed. Reported on 03/02/2015    . Omega-3 Krill Oil 500 MG CAPS Take 500 mg by mouth.    . ondansetron (ZOFRAN ODT) 4 MG disintegrating tablet Take 1 each morning then  every 6 hours for nausea. 120 tablet 2  . pantoprazole (PROTONIX) 40 MG tablet Take 1 tablet (40 mg total) by mouth 2 (two) times daily. 60 tablet 3  . predniSONE (DELTASONE) 10 MG tablet 6 tablets on Day 1 , then reduce by 1 tablet daily until gone 21 tablet 0  . Probiotic Product (PROBIOTIC DAILY PO) Take by mouth.    . QUEtiapine (SEROQUEL XR) 300 MG 24 hr tablet TK 2 TS PO QHS  12  . ranitidine (ZANTAC) 300 MG tablet Take 0.5 tablets (150 mg total) by mouth 2 (two) times daily. 60 tablet 3  . Semaglutide (OZEMPIC) 1 MG/DOSE SOPN Inject 1 mg into the skin once a week. 4 pen 3  . Simethicone (GAS-X PO) Take 1 tablet by mouth 4 (four) times daily.    . valACYclovir (VALTREX) 1000 MG tablet Take 1 tablet (1,000 mg total) by mouth daily. 30 tablet 5  . vitamin C (ASCORBIC ACID) 500 MG tablet Take 250 mg by mouth daily. Takes 2 a day    . VYVANSE 60 MG capsule TK ONE C PO QAM  0  . zaleplon (SONATA) 10 MG capsule Take 20 mg by mouth at bedtime as needed for sleep.     No current  facility-administered medications on file prior to visit.     Review of Systems  Constitutional: Negative for activity change, appetite change, fatigue, fever and unexpected weight change.  HENT: Positive for mouth sores. Negative for congestion, ear pain, rhinorrhea, sinus pressure and sore throat.   Eyes: Negative for pain, redness and visual disturbance.  Respiratory: Negative for cough, shortness of breath and wheezing.   Cardiovascular: Negative for chest pain and palpitations.  Gastrointestinal: Negative for abdominal pain, blood in stool, constipation and diarrhea.  Endocrine: Negative for polydipsia and polyuria.  Genitourinary: Negative for dysuria, frequency and urgency.  Musculoskeletal: Positive for joint swelling and myalgias. Negative for arthralgias and back pain.       Sore R knee and cheek   Skin: Positive for wound. Negative for pallor and rash.  Allergic/Immunologic: Negative for environmental allergies.  Neurological: Negative for dizziness, syncope and headaches.  Hematological: Negative for adenopathy. Does not bruise/bleed easily.  Psychiatric/Behavioral: Negative for decreased concentration and dysphoric mood. The patient is not nervous/anxious.        Objective:   Physical Exam  Constitutional: She appears well-developed and well-nourished. No distress.  obese and well appearing   HENT:  Head: Normocephalic and atraumatic.  Mouth/Throat: No oropharyngeal exudate.  1 cm white based ulcer on L inner/lower lip  Also 0.5 linear one at L base of tongue  No drainage  Minimal lip swelling  Eyes: Pupils are equal, round, and reactive to light. Conjunctivae and EOM are normal. Right eye exhibits no discharge. Left eye exhibits no discharge. No scleral icterus.  Neck: Normal range of motion. Neck supple.  Nl rom  No tenderness or cervical step off   Cardiovascular: Normal rate, regular rhythm and normal heart sounds.  Pulmonary/Chest: Effort normal and breath  sounds normal. No stridor. No respiratory distress. She has no wheezes.  Musculoskeletal: Normal range of motion. She exhibits edema and tenderness. She exhibits no deformity.       Right knee: She exhibits swelling, ecchymosis and bony tenderness. She exhibits normal range of motion, normal alignment and no LCL laxity. Tenderness found. Patellar tendon tenderness noted.  Pre patellar bursal swelling R knee Abrasion inferior to it  Gait not affected    Lymphadenopathy:  She has no cervical adenopathy.  Neurological: She is alert. She displays no atrophy. No cranial nerve deficit or sensory deficit. Coordination normal.  Skin: Skin is warm and dry. No rash noted.  Psychiatric: She has a normal mood and affect.  Pleasant           Assessment & Plan:   Problem List Items Addressed This Visit      Respiratory   Pleural thickening - Primary    asymmetric R pleural thickening seen incidentally on CS film Will do cxr today to confirm/investigate in pt with prior hx of lung cancer No symptoms      Relevant Orders   DG Chest 2 View     Digestive   Mouth ulcers    On inner L lower lip and L base of tongue after some L sided dental work Suspect traumatic Px kenalog in orabase for symptoms Salt water rinse prn Avoid foods that irritate  Suspect will resolve soon but update if not         Nervous and Auditory   Cervical radiculopathy due to degenerative joint disease of spine    Rev notes/films from [place] visit Much improved with prednisone thankfully         Musculoskeletal and Integument   Abrasion of right knee    After a fall on Monday on a ramp  Wound looks clean and healing -no active bleeding Enc her to continue soap/water/ loose cover and abx oint Alert [place] if red/swelling or worse  Also some pre patellar bursa swelling vs hematoma  Disc use of ice and elevation when able        Other   RESOLVED: Acute pain of left shoulder    Resolved after tx with  prednisone with Dr Derrel Nip      Fall    [place] ramp at aquatic center on Monday- while carrying a bag and pulling a cart  Fall prev disc in detail today  Disc avoiding that situation again (both hands full)  Abrasion on knee and R cheekbone are healing No concussion symptoms  inst to update if anything changes

## 2017-11-05 NOTE — Assessment & Plan Note (Signed)
Rev notes/films from [place] visit Much improved with prednisone thankfully

## 2017-11-05 NOTE — Assessment & Plan Note (Signed)
On ramp at aquatic center on Monday- while carrying a bag and pulling a cart  Fall prev disc in detail today  Disc avoiding that situation again (both hands full)  Abrasion on knee and R cheekbone are healing No concussion symptoms  inst to update if anything changes

## 2017-11-05 NOTE — Assessment & Plan Note (Addendum)
asymmetric R pleural thickening seen incidentally on CS film Will do cxr today to confirm/investigate in pt with prior hx of lung cancer No symptoms

## 2017-11-05 NOTE — Assessment & Plan Note (Signed)
After a fall on Monday on a ramp  Wound looks clean and healing -no active bleeding Enc her to continue soap/water/ loose cover and abx oint Alert [place] if red/swelling or worse  Also some pre patellar bursa swelling vs hematoma  Disc use of ice and elevation when able

## 2017-11-05 NOTE — Assessment & Plan Note (Signed)
Resolved after tx with prednisone with Dr Derrel Nip

## 2017-11-05 NOTE — Patient Instructions (Signed)
Chest xray now   Take care of yourself Try the dental kenalog as needed on ulcers - these should start to improve soon   Keep the abrasions clean with soap and water  Antibiotic ointment as needed

## 2017-11-05 NOTE — Assessment & Plan Note (Signed)
On inner L lower lip and L base of tongue after some L sided dental work Suspect traumatic Px kenalog in orabase for symptoms Salt water rinse prn Avoid foods that irritate  Suspect will resolve soon but update if not

## 2017-11-06 ENCOUNTER — Ambulatory Visit: Payer: Self-pay | Admitting: Cardiovascular Disease

## 2017-11-10 DIAGNOSIS — M955 Acquired deformity of pelvis: Secondary | ICD-10-CM | POA: Diagnosis not present

## 2017-11-10 DIAGNOSIS — M5136 Other intervertebral disc degeneration, lumbar region: Secondary | ICD-10-CM | POA: Diagnosis not present

## 2017-11-10 DIAGNOSIS — M9903 Segmental and somatic dysfunction of lumbar region: Secondary | ICD-10-CM | POA: Diagnosis not present

## 2017-11-10 DIAGNOSIS — M9905 Segmental and somatic dysfunction of pelvic region: Secondary | ICD-10-CM | POA: Diagnosis not present

## 2017-11-16 ENCOUNTER — Other Ambulatory Visit: Payer: Self-pay | Admitting: Endocrinology

## 2017-11-16 NOTE — Progress Notes (Signed)
Cardiology Office Note  Date:  11/18/2017   ID:  Lindsay Fry, Lindsay Fry 1960/09/01, MRN [id number]  PCP:  Abner Greenspan, MD   Chief Complaint  Patient presents with  . other    6 mo F/U. Medications reviewed verbally.    HPI:  Lindsay Fry is a very pleasant 57 year old woman with history of   Morbid obesity,  CAD Severe ostial ramus disease treated medically by catheterization August 2016, hypertension,  hyperlipidemia,   previous symptoms of chest pain and shortness of breath.  Migraines Chronic tachycardia She presents today for follow-up of her coronary artery disease, hypertension, hyperlipidemia  On her last clinic visit had EKG changes Stress test was ordered November 14, 2016 This showed no significant ischemia, low risk scan, ejection fraction 74%  Diarrhea last night, PVCs today on EKG, no symptoms  Vertigo last week, had N/Vomiting  doing water aerobics 3x a week Chronic shortness of breath, mild, deconditioning Walking 1 mile a day  Brilinta is very expensive for her.  Change to Plavix She stopped aspirin on her own  Denies any episodes of tachycardia or palpitations  Lab work reviewed with her HBA1C 6.6  EKG personally reviewed by myself on todays visit Shows normal sinus rhythm with rate 85 bpm, PVCs, frequent  Other past medical history Previously had left arm pain coming on at rest. This seemed to resolve with NSAIDs. Also with several episodes of higher blood pressure, possibly associated with stress. He states that she has been taking clonidine once a day in the evening, amlodipine in the morning  Vertigo symptoms occurred in early 2014 and recurrent August 9. She is taking meclizine. She has dizziness and feels that she is drunk when she stands up.  History of hyperlipidemia. In June 2010, total cholesterol 314.  Remote smoking for a short period of time, none recently.  PMH:   has a past medical history of ADD (attention deficit disorder)  (11/19/2012), Allergic rhinitis, cause unspecified, Anxiety state, unspecified, Arthritis, Asymptomatic postmenopausal status (age-related) (natural), Atypical hyperplasia of left breast (2000), Benign neoplasm of other and unspecified site of the digestive system, Bipolar disorder ([place]), Breast cancer ([place]), CAD (coronary artery disease), Carcinoma of left breast ([place]) (06/02/2014), Chronic diastolic CHF (congestive heart failure) ([place]), Clotting disorder ([place]), Cyst, Depression, Diabetes mellitus ([place]), Dysuria, Edema, Family history of malignant neoplasm of gastrointestinal tract, Fibromyalgia, GERD (gastroesophageal reflux disease), Glaucoma, Headache, Hiatal hernia, History of alcoholism ([place]), History of migraines, History of ovarian cyst, Insomnia, unspecified, Myocardial infarction ([place]) (09-04-14), OCD (obsessive compulsive disorder), Osteopenia (09/25/2016), Other screening mammogram, Pure hypercholesterolemia, Rosacea, Subacute confusional state (11/19/2012), Tubular adenoma of colon (01/10/11), Ulcerative colitis, unspecified, Unspecified asthma(493.90), Unspecified essential hypertension, Unspecified hypothyroidism, Unspecified vitamin D deficiency, and Vertigo.  PSH:    Past Surgical History:  Procedure Laterality Date  . APPENDECTOMY    . AXILLARY SENTINEL NODE BIOPSY Left 05/23/2014   Procedure: AXILLARY SENTINEL NODE BIOPSY;  Surgeon: Christene Lye, MD;  Location: [facility];  Service: General;  Laterality: Left;  . BREAST BIOPSY  Aug 2000   on tamoxifen, atypical hyperplasia  . BREAST SURGERY Left Aug 2000   lumpectomy/ Dr Sharlet Salina  . BREAST SURGERY Bilateral 05/23/14   Mastectomy  . BUNIONECTOMY Right   . CARDIAC CATHETERIZATION N/A 09/05/2014   Procedure: Left Heart Cath and Coronary Angiography;  Surgeon: Wellington Hampshire, MD;  Location: [facility];  Service: Cardiovascular;  Laterality: N/A;  . CARDIAC CATHETERIZATION  09-05-14  . CATARACT EXTRACTION W/  INTRAOCULAR LENS  IMPLANT    . CATARACT EXTRACTION W/ INTRAOCULAR LENS IMPLANT Bilateral   . CATARACT EXTRACTION, BILATERAL    . CESAREAN SECTION  1996/1997   placenta previa, gest DM, pre-eclampsia  . cholecystect  1999  . CHOLECYSTECTOMY  1997   adhesions also  . COLONOSCOPY  01/2001   Ulcerative colitis  . COLONOSCOPY  04/2004   UC, polyp  . COLONOSCOPY  12/08   UC, no polyps  . DEXA  04/1999 and 2010   normal  . ESOPHAGOGASTRODUODENOSCOPY  09/2001   polyp  . exercise stress test  11/2003   negative  . FEMUR FRACTURE SURGERY Left   . MASTECTOMY Bilateral 05-23-14   Dr. Jamal Collin  . MECKEL DIVERTICULUM EXCISION  1999  . NASAL SINUS SURGERY  05/1997  . nuclear stress test  06/2007   negative  . precancerous mole removed    . SIMPLE MASTECTOMY WITH AXILLARY SENTINEL NODE BIOPSY Bilateral 05/23/2014   Procedure: Bilateral simple mastectomy, left sentinel node biopsy ;  Surgeon: Christene Lye, MD;  Location: [facility];  Service: General;  Laterality: Bilateral;  . Sleep study  11/09   no apnea, but did snore (done by [facility])  . TONSILLECTOMY  1984  . TUBAL LIGATION    . WISDOM TOOTH EXTRACTION  1980's    Current Outpatient Medications on File Prior to Visit  Medication Sig Dispense Refill  . amantadine (SYMMETREL) 100 MG capsule Take 100 mg by mouth 2 (two) times daily.    Marland Kitchen atorvastatin (LIPITOR) 80 MG tablet TAKE 1 TABLET(80 MG) BY MOUTH DAILY 90 tablet 3  . b complex vitamins capsule Take 1 capsule by mouth daily.    . B-D UF III MINI PEN NEEDLES 31G X 5 MM MISC USE FOR INJECTIONS TWICE DAILY 100 each 0  . BAYER MICROLET LANCETS lancets USE AS DIRECTED TO CHECK BLOOD SUGAR TWICE DAILY 200 each 2  . Blood Glucose Monitoring Suppl (CONTOUR NEXT EZ MONITOR) w/Device KIT USE AS DIRECTED TO CHECK BLOOD SUGAR TWICE DAILY 1 kit 2  . Calcium Carb-Cholecalciferol (CALCIUM PLUS VITAMIN D3) 600-800 MG-UNIT TABS Take 1 tablet by mouth daily.    Marland Kitchen CARTIA XT 240 MG 24 hr capsule TAKE 1 CAPSULE(240 MG) BY  MOUTH DAILY 90 capsule 3  . chlorproMAZINE (THORAZINE) 25 MG tablet Take 25-50 mg by mouth as needed. Reported on 03/02/2015    . cloNIDine (CATAPRES) 0.1 MG tablet TAKE 1 TABLET(0.1 MG) BY MOUTH TWICE DAILY (Patient taking differently: Take 0.1 mg by mouth daily. ) 180 tablet 3  . clopidogrel (PLAVIX) 75 MG tablet Take 1 tablet (75 mg total) by mouth daily. 90 tablet 3  . co-enzyme Q-10 50 MG capsule Take 50 mg by mouth daily.    . diazepam (VALIUM) 10 MG tablet Take 2.5-10 mg by mouth as needed for anxiety.    . diphenoxylate-atropine (LOMOTIL) 2.5-0.025 MG tablet Take 1-2 tablets by mouth 3 times a day as needed for diarrhea 90 tablet 0  . FIBER SELECT GUMMIES PO Take 3 capsules by mouth daily.    Marland Kitchen gabapentin (NEURONTIN) 300 MG capsule TAKE 1 CAPSULE(300 MG) BY MOUTH THREE TIMES DAILY 90 capsule 11  . glucose blood (BAYER CONTOUR NEXT TEST) test strip USE AS DIRECTED TO CHECK BLOOD SUGAR TWICE DAILY 200 each 2  . Insulin Detemir (LEVEMIR FLEXTOUCH) 100 UNIT/ML Pen INJECT 48 UNITS UNDER THE SKIN DAILY AT 10 PM 45 mL 1  . Insulin Pen Needle (B-D UF III  MINI PEN NEEDLES) 31G X 5 MM MISC USE FOR INJECTIONS TWICE DAILY 100 each 0  . INVOKANA 300 MG TABS tablet TAKE 1 TABLET(300 MG) BY MOUTH DAILY BEFORE BREAKFAST 30 tablet 0  . lamoTRIgine (LAMICTAL) 200 MG tablet Take 400 mg by mouth daily.    Marland Kitchen latanoprost (XALATAN) 0.005 % ophthalmic solution Place 1 drop into both eyes At bedtime.    Marland Kitchen letrozole (FEMARA) 2.5 MG tablet Take 1 tablet (2.5 mg total) by mouth daily. 30 tablet 12  . levothyroxine (SYNTHROID, LEVOTHROID) 200 MCG tablet TAKE 1 TABLET(200 MCG) BY MOUTH DAILY BEFORE BREAKFAST 30 tablet 0  . LINZESS 290 MCG CAPS capsule TAKE 1 CAPSULE(290 MCG) BY MOUTH DAILY BEFORE BREAKFAST 30 capsule 6  . lithium carbonate (ESKALITH) 450 MG CR tablet Take 900 mg by mouth at bedtime.    . Melatonin 10 MG CAPS Take 1 capsule by mouth at bedtime.    . mesalamine (LIALDA) 1.2 g EC tablet TAKE 2 TABLETS BY  MOUTH EVERY DAY 60 tablet 2  . metoprolol succinate (TOPROL-XL) 50 MG 24 hr tablet TK 1 T PO QPM WITH OR IMM FOLLOWING A MEAL  3  . Multiple Vitamin (MULTIVITAMIN) tablet Take 1 tablet by mouth daily.      . nitrofurantoin (MACRODANTIN) 100 MG capsule Take 100 mg by mouth as directed. Reported on 03/02/2015    . Omega-3 Krill Oil 500 MG CAPS Take 500 mg by mouth.    . ondansetron (ZOFRAN ODT) 4 MG disintegrating tablet Take 1 each morning then every 6 hours for nausea. 120 tablet 2  . pantoprazole (PROTONIX) 40 MG tablet Take 1 tablet (40 mg total) by mouth 2 (two) times daily. 60 tablet 3  . predniSONE (DELTASONE) 10 MG tablet 6 tablets on Day 1 , then reduce by 1 tablet daily until gone 21 tablet 0  . Probiotic Product (PROBIOTIC DAILY PO) Take by mouth.    . QUEtiapine (SEROQUEL XR) 300 MG 24 hr tablet TK 2 TS PO QHS  12  . ranitidine (ZANTAC) 300 MG tablet Take 0.5 tablets (150 mg total) by mouth 2 (two) times daily. 60 tablet 3  . Semaglutide (OZEMPIC) 1 MG/DOSE SOPN Inject 1 mg into the skin once a week. 4 pen 3  . Simethicone (GAS-X PO) Take 1 tablet by mouth 4 (four) times daily.    Marland Kitchen SYNTHROID 200 MCG tablet TAKE 1 TABLET(200 MCG) BY MOUTH DAILY BEFORE BREAKFAST 30 tablet 0  . triamcinolone (KENALOG) 0.1 % paste Use as directed 1 application in the mouth or throat 2 (two) times daily. To affected areas 5 g 1  . valACYclovir (VALTREX) 1000 MG tablet Take 1 tablet (1,000 mg total) by mouth daily. 30 tablet 5  . vitamin C (ASCORBIC ACID) 500 MG tablet Take 250 mg by mouth daily. Takes 2 a day    . VYVANSE 60 MG capsule TK ONE C PO QAM  0  . zaleplon (SONATA) 10 MG capsule Take 20 mg by mouth at bedtime as needed for sleep.     No current facility-administered medications on file prior to visit.     Allergies:   Ephedrine; Aspartame and phenylalanine; Aspirin; Crestor [rosuvastatin]; Erythromycin; Nsaids; Other; and Oxycodone   Social History:  The patient  reports that she quit smoking  about 24 years ago. Her smoking use included cigarettes. She has a 1.25 pack-year smoking history. She has quit using smokeless tobacco. She reports that she does not drink alcohol or use drugs.  Family History:   family history includes Alzheimer's disease in her father; Breast cancer in her unknown relative; Colitis in her mother; Colon cancer in her father; Coronary artery disease in her father, mother, and unknown relative; Crohn's disease in her mother; Dementia in her father; Diabetes in her unknown relative; Hypertension in her mother; Osteoporosis in her mother; Stroke in her cousin.    Review of Systems: Review of Systems  Constitutional: Negative.   Respiratory: Negative.   Cardiovascular: Negative.   Gastrointestinal: Positive for diarrhea and vomiting.  Musculoskeletal: Negative.   Neurological: Negative.   Psychiatric/Behavioral: Negative.   All other systems reviewed and are negative.   PHYSICAL EXAM: VS:  BP 132/88 (BP Location: Left Arm, Patient Position: Sitting, Cuff Size: Normal)   Pulse 85   Ht 5' 6.5" (1.689 m)   Wt 212 lb (96.2 kg)   LMP 08/23/2006 (Approximate) Comment: tubal ligation  BMI 33.71 kg/m  , BMI Body mass index is 33.71 kg/m. Constitutional:  oriented to person, place, and time. No distress.  HENT:  Head: Grossly normal Eyes:  no discharge. No scleral icterus.  Neck: No JVD, no carotid bruits  Cardiovascular: Regular rate and rhythm, no murmurs appreciated Pulmonary/Chest: Clear to auscultation bilaterally, no wheezes or rails Abdominal: Soft.  no distension.  no tenderness.  Musculoskeletal: Normal range of motion Neurological:  normal muscle tone. Coordination normal. No atrophy Skin: Skin warm and dry Psychiatric: normal affect, pleasant  Recent Labs: 07/24/2017: TSH 2.29 08/29/2017: ALT 31; BUN 12; Creatinine, Ser 0.91; Hemoglobin 14.8; Platelets 358; Potassium 3.9; Sodium 137    Lipid Panel Lab Results  Component Value Date   CHOL  137 02/06/2017   HDL 41.90 02/06/2017   LDLCALC 69 02/06/2017   TRIG 128.0 02/06/2017      Wt Readings from Last 3 Encounters:  11/18/17 212 lb (96.2 kg)  11/05/17 211 lb (95.7 kg)  10/28/17 216 lb 3.2 oz (98.1 kg)      ASSESSMENT AND PLAN:  Coronary artery disease involving native coronary artery of native heart without angina pectoris - Plan: EKG 12-Lead Denies any anginal symptoms We will continue Plavix, statin Prior stress test 2018 with no ischemia  Essential hypertension - Plan: EKG 12-Lead Recommend she hold clonidine, blood pressure running low at home Also with dry mouth She will continue to monitor blood pressure  HYPERCHOLESTEROLEMIA Lipids at goal, no changes made to her medications  Type 2 diabetes mellitus with other circulatory complication, unspecified long term insulin use status ([place]) On last clinic visit had 15 pound weight gain, weight now still running high but stable Long discussion, recommended low carbohydrate diet, continue exercise program  PVCs Frequent, noted on EKG today but she is asymptomatic Recent vomiting, also with diarrhea last night Suspect electrolyte issue To try to replenish potassium Avoid magnesium given tendency for diarrhea  Malignant neoplasm of overlapping sites of left female breast, unspecified estrogen receptor status ([place]) Followed by oncology   Total encounter time more than 25 minutes  Greater than 50% was spent in counseling and coordination of care with the patient   Disposition:   F/U  12 months   Orders Placed This Encounter  Procedures  . EKG 12-Lead     Signed, Esmond Plants, M.D., Ph.D. 11/18/2017  [place], [place]

## 2017-11-17 ENCOUNTER — Other Ambulatory Visit: Payer: Self-pay

## 2017-11-17 ENCOUNTER — Other Ambulatory Visit: Payer: Self-pay | Admitting: Endocrinology

## 2017-11-17 MED ORDER — ONDANSETRON 4 MG PO TBDP: ORAL_TABLET | ORAL | 2 refills | Status: DC

## 2017-11-18 ENCOUNTER — Ambulatory Visit (INDEPENDENT_AMBULATORY_CARE_PROVIDER_SITE_OTHER): Payer: BLUE CROSS/BLUE SHIELD | Admitting: Cardiovascular Disease

## 2017-11-18 ENCOUNTER — Encounter: Payer: Self-pay | Admitting: Cardiovascular Disease

## 2017-11-18 VITALS — BP 132/88 | HR 85 | Ht 66.5 in | Wt 212.0 lb

## 2017-11-18 DIAGNOSIS — I10 Essential (primary) hypertension: Secondary | ICD-10-CM

## 2017-11-18 DIAGNOSIS — Z23 Encounter for immunization: Secondary | ICD-10-CM | POA: Diagnosis not present

## 2017-11-18 DIAGNOSIS — R000 Tachycardia, unspecified: Secondary | ICD-10-CM | POA: Diagnosis not present

## 2017-11-18 DIAGNOSIS — K51919 Ulcerative colitis, unspecified with unspecified complications: Secondary | ICD-10-CM

## 2017-11-18 DIAGNOSIS — I25118 Atherosclerotic heart disease of native coronary artery with other forms of angina pectoris: Secondary | ICD-10-CM | POA: Diagnosis not present

## 2017-11-18 DIAGNOSIS — E785 Hyperlipidemia, unspecified: Secondary | ICD-10-CM

## 2017-11-18 DIAGNOSIS — E1159 Type 2 diabetes mellitus with other circulatory complications: Secondary | ICD-10-CM

## 2017-11-18 DIAGNOSIS — Z170 Estrogen receptor positive status [ER+]: Secondary | ICD-10-CM

## 2017-11-18 DIAGNOSIS — C50812 Malignant neoplasm of overlapping sites of left female breast: Secondary | ICD-10-CM | POA: Diagnosis not present

## 2017-11-18 NOTE — Patient Instructions (Addendum)
Medication Instructions:   Stop the clonidine, blood pressure low  If you need a refill on your cardiac medications before your next appointment, please call your pharmacy.    Lab work: No new labs needed   If you have labs (blood work) drawn today and your tests are completely normal, you will receive your results only by: Marland Kitchen MyChart Message (if you have MyChart) OR . A paper copy in the mail If you have any lab test that is abnormal or we need to change your treatment, we will call you to review the results.   Testing/Procedures: No new testing needed   Follow-Up: At [facility], you and your health needs are our priority.  As part of our continuing mission to provide you with exceptional heart care, we have created designated Provider Care Teams.  These Care Teams include your primary Cardiologist (physician) and Advanced Practice Providers (APPs -  Physician Assistants and Nurse Practitioners) who all work together to provide you with the care you need, when you need it.  . You will need a follow up appointment in 12 months .   Please call our office 2 months in advance to schedule this appointment.    . Providers on your designated Care Team:   . Murray Hodgkins, NP . Christell Faith, PA-C . Marrianne Mood, PA-C  Any Other Special Instructions Will Be Listed Below (If Applicable).  For educational health videos Log in to : [email or web address] Or : [email or web address], password : [email or web address]

## 2017-11-24 ENCOUNTER — Ambulatory Visit: Payer: Self-pay | Admitting: Psychology

## 2017-11-24 ENCOUNTER — Other Ambulatory Visit (INDEPENDENT_AMBULATORY_CARE_PROVIDER_SITE_OTHER): Payer: BLUE CROSS/BLUE SHIELD

## 2017-11-24 DIAGNOSIS — Z794 Long term (current) use of insulin: Secondary | ICD-10-CM | POA: Diagnosis not present

## 2017-11-24 DIAGNOSIS — E1165 Type 2 diabetes mellitus with hyperglycemia: Secondary | ICD-10-CM

## 2017-11-24 DIAGNOSIS — E890 Postprocedural hypothyroidism: Secondary | ICD-10-CM | POA: Diagnosis not present

## 2017-11-24 LAB — COMPREHENSIVE METABOLIC PANEL
ALT: 24 U/L (ref 0–35)
AST: 20 U/L (ref 0–37)
Albumin: 4.1 g/dL (ref 3.5–5.2)
Alkaline Phosphatase: 106 U/L (ref 39–117)
BUN: 9 mg/dL (ref 6–23)
CO2: 27 mEq/L (ref 19–32)
Calcium: 9.5 mg/dL (ref 8.4–10.5)
Chloride: 106 mEq/L (ref 96–112)
Creatinine, Ser: 0.92 mg/dL (ref 0.40–1.20)
GFR: 66.33 mL/min (ref >60.00)
Glucose, Bld: 147 mg/dL — ABNORMAL HIGH (ref 70–99)
Potassium: 3.8 mEq/L (ref 3.5–5.1)
Sodium: 143 mEq/L (ref 135–145)
Total Bilirubin: 0.6 mg/dL (ref 0.2–1.2)
Total Protein: 6.5 g/dL (ref 6.0–8.3)

## 2017-11-24 LAB — TSH: TSH: 0.71 u[IU]/mL (ref 0.35–4.50)

## 2017-11-24 LAB — HEMOGLOBIN A1C: Hgb A1c MFr Bld: 6.9 % — ABNORMAL HIGH (ref 4.6–6.5)

## 2017-12-01 ENCOUNTER — Ambulatory Visit (INDEPENDENT_AMBULATORY_CARE_PROVIDER_SITE_OTHER): Payer: BLUE CROSS/BLUE SHIELD | Admitting: Endocrinology

## 2017-12-01 ENCOUNTER — Encounter: Payer: Self-pay | Admitting: Endocrinology

## 2017-12-01 VITALS — BP 134/84 | HR 86 | Ht 65.5 in | Wt 212.0 lb

## 2017-12-01 DIAGNOSIS — E1165 Type 2 diabetes mellitus with hyperglycemia: Secondary | ICD-10-CM

## 2017-12-01 DIAGNOSIS — Z794 Long term (current) use of insulin: Secondary | ICD-10-CM

## 2017-12-01 DIAGNOSIS — E890 Postprocedural hypothyroidism: Secondary | ICD-10-CM | POA: Diagnosis not present

## 2017-12-01 NOTE — Progress Notes (Signed)
Patient ID: Lindsay Fry, female   DOB: June 23, 1960, 57 y.o.   MRN: [id number]   Reason for Appointment:  Hypothyroidism and diabetes, followup visit  History of Present Illness:  DIABETES:  Prior history: With her high A1c of 6.7 in 03/2014 she had an abnormal glucose tolerance test indicating diabetes and 1 hour glucose of 300 She was started on metformin; however even with 1500 mg of metformin ER  blood sugars were not controlled especially fasting Subsequently with taking Janumet XR 100/1000 daily her blood sugars were much better Because of her weight gain and A1c going up to 7.3 along with occasional readings over 200 she was started on Victoza in 11/16 She has had progressive hyperglycemia previously and A1c was up to 8.6% in 4/17  She was started on Levemir insulin in 08/2015 because of progressive rise in blood sugars and inability to control with 3 other agents  Non-insulin hypoglycemic drugs: Ozempic 1 mg weekly; metformin ER 2000 mg daily, Invokana 300 mg daily  INSULIN regimen: Levemir 50 units at bedtime  A1c is 6.9, last 6.6  Current management, blood sugar patterns and problems identified:  She has had slightly higher blood sugars overall but not consistent  As before she is sometimes tends to have higher fasting readings but they are reasonably good recently  She had a couple of readings over 200 after a round of prednisone last month  Because of her not being able to exercise consistently for various reasons also blood sugars may have been overall higher  She has not had any weight gain, still able to keep her weight down with continuing Ozempic  She did however have a couple of episodes of gastroenteritis recently  At times will tend to have more sweets, she thinks this is because of increased craving from taking higher dose of Seroquel Tolerating Ozempic without any nausea  Exercise regimen: Water aerobics and walking  GLUCOSE readings from  review of the CONTOUR monitor   PRE-MEAL Fasting Lunch Dinner Bedtime Overall  Glucose range:  111-144      Mean/median: 125    146   POST-MEAL PC Breakfast PC Lunch PC Dinner  Glucose range:    134-236  Mean/median:       Weight history:  Wt Readings from Last 3 Encounters:  12/01/17 212 lb (96.2 kg)  11/18/17 212 lb (96.2 kg)  11/05/17 211 lb (95.7 kg)   Previous consultation with dietitian: none   LABS:  Lab Results  Component Value Date   HGBA1C 6.9 (H) 11/24/2017   HGBA1C 6.6 (H) 07/24/2017   HGBA1C 6.4 02/06/2017   Lab Results  Component Value Date   MICROALBUR 3.6 (H) 02/06/2017   Cool 69 02/06/2017   CREATININE 0.92 11/24/2017   HYPOTHYROIDISM: This was first diagnosed in 1992 after her treatment for Graves' disease with I-131 She has been on relatively large doses of thyroxine supplements She was on 224 mcg since 11/13 and her TSH was normal in 3/14 Subjectively difficult to assess her thyroid since she tends to have fatigue chronically.  In 2014 her dose was reduced to 200 mcg daily and her dose has been fluctuating since then She had required periodic increase in dosage including in 05/2014 when her TSH was 20  She is  on 200 g daily, on the brand name Synthroid  Her symptoms are usually nonspecific for hypothyroidism, mostly related to other medical problems including sleep disturbances and bipolar illness. She is generally feeling fairly good  She has been quite regular with the Synthroid every day before breakfast         TSH is consistently normal y  Lab Results  Component Value Date   TSH 0.71 11/24/2017   TSH 2.29 07/24/2017   TSH 0.32 (L) 02/06/2017   FREET4 0.77 07/24/2017   FREET4 1.15 02/06/2017   FREET4 1.76 (H) 10/25/2016     No visits with results within 1 Week(s) from this visit.  Latest known visit with results is:  Lab on 11/24/2017  Component Date Value Ref Range Status  . TSH 11/24/2017 0.71  0.35 - 4.50 uIU/mL Final    . Sodium 11/24/2017 143  135 - 145 mEq/L Final  . Potassium 11/24/2017 3.8  3.5 - 5.1 mEq/L Final  . Chloride 11/24/2017 106  96 - 112 mEq/L Final  . CO2 11/24/2017 27  19 - 32 mEq/L Final  . Glucose, Bld 11/24/2017 147* 70 - 99 mg/dL Final  . BUN 11/24/2017 9  6 - 23 mg/dL Final  . Creatinine, Ser 11/24/2017 0.92  0.40 - 1.20 mg/dL Final  . Total Bilirubin 11/24/2017 0.6  0.2 - 1.2 mg/dL Final  . Alkaline Phosphatase 11/24/2017 106  39 - 117 U/L Final  . AST 11/24/2017 20  0 - 37 U/L Final  . ALT 11/24/2017 24  0 - 35 U/L Final  . Total Protein 11/24/2017 6.5  6.0 - 8.3 g/dL Final  . Albumin 11/24/2017 4.1  3.5 - 5.2 g/dL Final  . Calcium 11/24/2017 9.5  8.4 - 10.5 mg/dL Final  . GFR 11/24/2017 66.33  >60.00 mL/min Final  . Hgb A1c MFr Bld 11/24/2017 6.9* 4.6 - 6.5 % Final   Glycemic Control Guidelines for People with Diabetes:Non Diabetic:  <6%Goal of Therapy: <7%Additional Action Suggested:  >8%     Allergies as of 12/01/2017      Reactions   Ephedrine Other (See Comments)   Pt becomes hyper   Aspartame And Phenylalanine Nausea Only   Aspirin    REACTION: aggrivates colitis   Crestor [rosuvastatin]    REACTION: increased lfts   Erythromycin    REACTION: GI upset   Nsaids    REACTION: aggrivate colitis   Other Other (See Comments)   Unable to take due to history of ulcerative colitis   Oxycodone Other (See Comments)   Panic Attack      Medication List        Accurate as of 12/01/17  3:53 PM. Always use your most recent med list.          amantadine 100 MG capsule Commonly known as:  SYMMETREL Take 100 mg by mouth 2 (two) times daily.   atorvastatin 80 MG tablet Commonly known as:  LIPITOR TAKE 1 TABLET(80 MG) BY MOUTH DAILY   b complex vitamins capsule Take 1 capsule by mouth daily.   BAYER MICROLET LANCETS lancets USE AS DIRECTED TO CHECK BLOOD SUGAR TWICE DAILY   CALCIUM PLUS VITAMIN D3 600-800 MG-UNIT Tabs Generic drug:  Calcium  Carb-Cholecalciferol Take 1 tablet by mouth daily.   CARTIA XT 240 MG 24 hr capsule Generic drug:  diltiazem TAKE 1 CAPSULE(240 MG) BY MOUTH DAILY   chlorproMAZINE 25 MG tablet Commonly known as:  THORAZINE Take 25-50 mg by mouth as needed. Reported on 03/02/2015   clopidogrel 75 MG tablet Commonly known as:  PLAVIX Take 1 tablet (75 mg total) by mouth daily.   co-enzyme Q-10 50 MG capsule Take 50 mg by mouth daily.  CONTOUR NEXT EZ MONITOR w/Device Kit USE AS DIRECTED TO CHECK BLOOD SUGAR TWICE DAILY   diazepam 10 MG tablet Commonly known as:  VALIUM Take 2.5-10 mg by mouth as needed for anxiety.   diphenoxylate-atropine 2.5-0.025 MG tablet Commonly known as:  LOMOTIL Take 1-2 tablets by mouth 3 times a day as needed for diarrhea   FIBER SELECT GUMMIES PO Take 3 capsules by mouth daily.   gabapentin 300 MG capsule Commonly known as:  NEURONTIN TAKE 1 CAPSULE(300 MG) BY MOUTH THREE TIMES DAILY   GAS-X PO Take 1 tablet by mouth 4 (four) times daily.   glucose blood test strip USE AS DIRECTED TO CHECK BLOOD SUGAR TWICE DAILY   Insulin Detemir 100 UNIT/ML Pen Commonly known as:  LEVEMIR INJECT 48 UNITS UNDER THE SKIN DAILY AT 10 PM   Insulin Pen Needle 31G X 5 MM Misc USE FOR INJECTIONS TWICE DAILY   B-D UF III MINI PEN NEEDLES 31G X 5 MM Misc Generic drug:  Insulin Pen Needle USE FOR INJECTIONS TWICE DAILY   INVOKANA 300 MG Tabs tablet Generic drug:  canagliflozin TAKE 1 TABLET(300 MG) BY MOUTH DAILY BEFORE BREAKFAST   lamoTRIgine 200 MG tablet Commonly known as:  LAMICTAL Take 400 mg by mouth daily.   latanoprost 0.005 % ophthalmic solution Commonly known as:  XALATAN Place 1 drop into both eyes At bedtime.   letrozole 2.5 MG tablet Commonly known as:  FEMARA Take 1 tablet (2.5 mg total) by mouth daily.   levothyroxine 200 MCG tablet Commonly known as:  SYNTHROID, LEVOTHROID TAKE 1 TABLET(200 MCG) BY MOUTH DAILY BEFORE BREAKFAST   SYNTHROID 200  MCG tablet Generic drug:  levothyroxine TAKE 1 TABLET(200 MCG) BY MOUTH DAILY BEFORE BREAKFAST   LINZESS 290 MCG Caps capsule Generic drug:  linaclotide TAKE 1 CAPSULE(290 MCG) BY MOUTH DAILY BEFORE BREAKFAST   lithium carbonate 450 MG CR tablet Commonly known as:  ESKALITH Take 900 mg by mouth at bedtime.   Melatonin 10 MG Caps Take 1 capsule by mouth at bedtime.   mesalamine 1.2 g EC tablet Commonly known as:  LIALDA TAKE 2 TABLETS BY MOUTH EVERY DAY   metoprolol succinate 50 MG 24 hr tablet Commonly known as:  TOPROL-XL TK 1 T PO QPM WITH OR IMM FOLLOWING A MEAL   multivitamin tablet Take 1 tablet by mouth daily.   nitrofurantoin 100 MG capsule Commonly known as:  MACRODANTIN Take 100 mg by mouth as directed. Reported on 03/02/2015   Omega-3 Krill Oil 500 MG Caps Take 500 mg by mouth.   pantoprazole 40 MG tablet Commonly known as:  PROTONIX Take 1 tablet (40 mg total) by mouth 2 (two) times daily.   predniSONE 10 MG tablet Commonly known as:  DELTASONE 6 tablets on Day 1 , then reduce by 1 tablet daily until gone   PROBIOTIC DAILY PO Take by mouth.   QUEtiapine 300 MG 24 hr tablet Commonly known as:  SEROQUEL XR TK 2 TS PO QHS   ranitidine 300 MG tablet Commonly known as:  ZANTAC Take 0.5 tablets (150 mg total) by mouth 2 (two) times daily.   Semaglutide (1 MG/DOSE) 2 MG/1.5ML Sopn Inject 1 mg into the skin once a week.   triamcinolone 0.1 % paste Commonly known as:  KENALOG Use as directed 1 application in the mouth or throat 2 (two) times daily. To affected areas   valACYclovir 1000 MG tablet Commonly known as:  VALTREX Take 1 tablet (1,000 mg total) by  mouth daily.   vitamin C 500 MG tablet Commonly known as:  ASCORBIC ACID Take 250 mg by mouth daily. Takes 2 a day   VYVANSE 60 MG capsule Generic drug:  lisdexamfetamine TK ONE C PO QAM   zaleplon 10 MG capsule Commonly known as:  SONATA Take 20 mg by mouth at bedtime as needed for sleep.        Allergies:  Allergies  Allergen Reactions  . Ephedrine Other (See Comments)    Pt becomes hyper  . Aspartame And Phenylalanine Nausea Only  . Aspirin     REACTION: aggrivates colitis  . Crestor [Rosuvastatin]     REACTION: increased lfts  . Erythromycin     REACTION: GI upset  . Nsaids     REACTION: aggrivate colitis  . Other Other (See Comments)    Unable to take due to history of ulcerative colitis  . Oxycodone Other (See Comments)    Panic Attack    Past Medical History:  Diagnosis Date  . ADD (attention deficit disorder) 11/19/2012  . Allergic rhinitis, cause unspecified   . Anxiety state, unspecified   . Arthritis   . Asymptomatic postmenopausal status (age-related) (natural)   . Atypical hyperplasia of left breast 2000  . Benign neoplasm of other and unspecified site of the digestive system   . Bipolar disorder ([place])   . Breast cancer ([place])    Left Breast 32m invasive CA left uiq, dcis left uoq and a lot ADH, ALH in both breasts.  .Marland KitchenCAD (coronary artery disease)    a. 08/2014 NSTEMI/Cath: LM nl, LAD 50p, D1/D2 min irregs, LCX nl, OM1 40, LPDA nl, RI 99ost (2.056mvessel->med Rx), RCA nl, AM 60, nl EF; b. 11/2016 MV: EF 74%, no ischemia (performed 2/2 dyspnea & new inf TWI).  . Marland Kitchenarcinoma of left breast ([facility]/19/2016  . Chronic diastolic CHF (congestive heart failure) ([facility]   a. 08/2014 Echo: EF 60-65%, Gr 1 DD, nl LA.  . Marland Kitchenlotting disorder ([facility]   on blood thiner, Plavix  . Cyst    on Achilles tendon  . Depression   . Diabetes mellitus ([place]   frank  . Dysuria   . Edema   . Family history of malignant neoplasm of gastrointestinal tract   . Fibromyalgia   . GERD (gastroesophageal reflux disease)   . Glaucoma    pre glaucoma  . Headache    migraines  . Hiatal hernia   . History of alcoholism ([facility]  . History of migraines   . History of ovarian cyst   . Insomnia, unspecified   . Myocardial infarction ([facility]8-21-16  . OCD (obsessive compulsive  disorder)   . Osteopenia 09/25/2016   Femoral neck T -1.1  9/18  . Other screening mammogram   . Pure hypercholesterolemia   . Rosacea   . Subacute confusional state 11/19/2012  . Tubular adenoma of colon 01/10/11  . Ulcerative colitis, unspecified   . Unspecified asthma(493.90)   . Unspecified essential hypertension   . Unspecified hypothyroidism   . Unspecified vitamin D deficiency   . Vertigo     Past Surgical History:  Procedure Laterality Date  . APPENDECTOMY    . AXILLARY SENTINEL NODE BIOPSY Left 05/23/2014   Procedure: AXILLARY SENTINEL NODE BIOPSY;  Surgeon: SeChristene LyeMD;  Location: [facility] ORS;  Service: General;  Laterality: Left;  . BREAST BIOPSY  Aug 2000   on tamoxifen, atypical hyperplasia  . BREAST SURGERY Left Aug  2000   lumpectomy/ Dr Sharlet Salina  . BREAST SURGERY Bilateral 05/23/14   Mastectomy  . BUNIONECTOMY Right   . CARDIAC CATHETERIZATION N/A 09/05/2014   Procedure: Left Heart Cath and Coronary Angiography;  Surgeon: Wellington Hampshire, MD;  Location: [facility];  Service: Cardiovascular;  Laterality: N/A;  . CARDIAC CATHETERIZATION  09-05-14  . CATARACT EXTRACTION W/ INTRAOCULAR LENS IMPLANT    . CATARACT EXTRACTION W/ INTRAOCULAR LENS IMPLANT Bilateral   . CATARACT EXTRACTION, BILATERAL    . CESAREAN SECTION  1996/1997   placenta previa, gest DM, pre-eclampsia  . cholecystect  1999  . CHOLECYSTECTOMY  1997   adhesions also  . COLONOSCOPY  01/2001   Ulcerative colitis  . COLONOSCOPY  04/2004   UC, polyp  . COLONOSCOPY  12/08   UC, no polyps  . DEXA  04/1999 and 2010   normal  . ESOPHAGOGASTRODUODENOSCOPY  09/2001   polyp  . exercise stress test  11/2003   negative  . FEMUR FRACTURE SURGERY Left   . MASTECTOMY Bilateral 05-23-14   Dr. Jamal Collin  . MECKEL DIVERTICULUM EXCISION  1999  . NASAL SINUS SURGERY  05/1997  . nuclear stress test  06/2007   negative  . precancerous mole removed    . SIMPLE MASTECTOMY WITH AXILLARY SENTINEL NODE  BIOPSY Bilateral 05/23/2014   Procedure: Bilateral simple mastectomy, left sentinel node biopsy ;  Surgeon: Christene Lye, MD;  Location: [facility] ORS;  Service: General;  Laterality: Bilateral;  . Sleep study  11/09   no apnea, but did snore (done by [facility])  . TONSILLECTOMY  1984  . TUBAL LIGATION    . WISDOM TOOTH EXTRACTION  1980's    Family History  Problem Relation Age of Onset  . Coronary artery disease Father   . Colon cancer Father   . Alzheimer's disease Father   . Dementia Father   . Coronary artery disease Mother   . Hypertension Mother   . Osteoporosis Mother   . Colitis Mother   . Crohn's disease Mother   . Breast cancer Unknown        great aunts  . Coronary artery disease Unknown        Uncle (also AAA)  . Diabetes Unknown        remote family history  . Stroke Cousin   . Esophageal cancer Neg Hx   . Rectal cancer Neg Hx   . Stomach cancer Neg Hx     Social History:  reports that she quit smoking about 24 years ago. Her smoking use included cigarettes. She has a 1.25 pack-year smoking history. She has quit using smokeless tobacco. She reports that she does not drink alcohol or use drugs.  REVIEW Of SYSTEMS:   HYPERCALCEMIA: Her calcium has been previously high, upper normal subsequently PTH in the past has been borderline for hyperparathyroidism and is last below 20  She is taking OTC vitamin D3, 2000 units and has been started on calcium by her oncologist, 500 mg  Bone density in 9/15 shows T score -1.1 at the hip  Lab Results  Component Value Date   PTH 18 10/25/2016   CALCIUM 9.5 11/24/2017   PHOS 2.3 01/29/2007     Lab Results  Component Value Date   CALCIUM 9.5 11/24/2017   PHOS 2.3 01/29/2007   Lab Results  Component Value Date   VD25OH 24.75 (L) 02/06/2017   VD25OH 44 05/12/2013   VD25OH 47 05/19/2012     HYPERTENSION:  Blood pressure is controlled, managed by cardiologist.   Currently on Bystolic twice a day and  diltiazem Clonidine stopped by cardiologist     Lab Results  Component Value Date   CREATININE 0.92 11/24/2017   BUN 9 11/24/2017   NA 143 11/24/2017   K 3.8 11/24/2017   CL 106 11/24/2017   CO2 27 11/24/2017    History of hypercholesterolemia: Management by cardiologist and has been on 80 mg total since her MI LDL below 70  Lab Results  Component Value Date   CHOL 137 02/06/2017   HDL 41.90 02/06/2017   LDLCALC 69 02/06/2017   LDLDIRECT 67.0 11/10/2015   TRIG 128.0 02/06/2017   CHOLHDL 3 02/06/2017   Possible fatty liver: Liver functions are ok   Lab Results  Component Value Date   ALT 24 11/24/2017      Examination:   BP 134/84   Pulse 86   Ht 5' 5.5" (1.664 m)   Wt 212 lb (96.2 kg)   LMP 08/23/2006 (Approximate) Comment: tubal ligation  SpO2 96%   BMI 34.74 kg/m       Assessments   DIABETES:  See history of present illness for evaluation of  current management, blood sugar patterns and problems identified  Her A1c is 6.9  She still has excellent control overall with a multidrug regimen including nasal insulin, Invokana and Ozempic  She is having somewhat variable compliance with diet but however has been able to keep her weight down No change in treatment recommended especially since she think she will be more consistent with exercise now  To check more readings after meals  Hypothyroidism, post ablative:  TSH is consistently normal with 200 mcg levothyroxine she will continue the same    There are no Patient Instructions on file for this visit.    Elayne Snare 12/01/2017, 3:53 PM   Note: This office note was prepared with Dragon voice recognition system technology. Any transcriptional errors that result from this process are unintentional.

## 2017-12-01 NOTE — Patient Instructions (Signed)
Check blood sugars on waking up 3 days a week  Also check blood sugars about 2 hours after meals and do this after different meals by rotation  Recommended blood sugar levels on waking up are 90-130 and about 2 hours after meal is 130-160  Please bring your blood sugar monitor to each visit, thank you   

## 2017-12-08 ENCOUNTER — Other Ambulatory Visit: Payer: Self-pay

## 2017-12-08 MED ORDER — INSULIN DETEMIR 100 UNIT/ML FLEXPEN: 50.0000 [IU] | PEN_INJECTOR | Freq: Every day | SUBCUTANEOUS | 3 refills | Status: DC

## 2017-12-15 ENCOUNTER — Other Ambulatory Visit: Payer: Self-pay | Admitting: Cardiovascular Disease

## 2017-12-17 ENCOUNTER — Other Ambulatory Visit: Payer: Self-pay

## 2017-12-17 MED ORDER — CANAGLIFLOZIN 300 MG PO TABS: ORAL_TABLET | ORAL | 3 refills | Status: DC

## 2017-12-18 ENCOUNTER — Other Ambulatory Visit: Payer: Self-pay | Admitting: Cardiovascular Disease

## 2017-12-23 ENCOUNTER — Ambulatory Visit (INDEPENDENT_AMBULATORY_CARE_PROVIDER_SITE_OTHER): Payer: BLUE CROSS/BLUE SHIELD | Admitting: Psychology

## 2017-12-23 DIAGNOSIS — F3131 Bipolar disorder, current episode depressed, mild: Secondary | ICD-10-CM

## 2017-12-24 ENCOUNTER — Other Ambulatory Visit: Payer: Self-pay | Admitting: Endocrinology

## 2017-12-26 ENCOUNTER — Other Ambulatory Visit: Payer: Self-pay

## 2017-12-26 MED ORDER — DIPHENOXYLATE-ATROPINE 2.5-0.025 MG PO TABS: ORAL_TABLET | ORAL | 0 refills | Status: DC

## 2018-01-16 ENCOUNTER — Other Ambulatory Visit: Payer: Self-pay | Admitting: Cardiovascular Disease

## 2018-01-18 ENCOUNTER — Other Ambulatory Visit: Payer: Self-pay | Admitting: Internal Medicine

## 2018-01-21 DIAGNOSIS — M5136 Other intervertebral disc degeneration, lumbar region: Secondary | ICD-10-CM | POA: Diagnosis not present

## 2018-01-21 DIAGNOSIS — M955 Acquired deformity of pelvis: Secondary | ICD-10-CM | POA: Diagnosis not present

## 2018-01-21 DIAGNOSIS — M9905 Segmental and somatic dysfunction of pelvic region: Secondary | ICD-10-CM | POA: Diagnosis not present

## 2018-01-21 DIAGNOSIS — M9903 Segmental and somatic dysfunction of lumbar region: Secondary | ICD-10-CM | POA: Diagnosis not present

## 2018-01-22 ENCOUNTER — Other Ambulatory Visit: Payer: Self-pay | Admitting: Endocrinology

## 2018-01-27 ENCOUNTER — Other Ambulatory Visit: Payer: Self-pay | Admitting: Internal Medicine

## 2018-01-30 DIAGNOSIS — H40053 Ocular hypertension, bilateral: Secondary | ICD-10-CM | POA: Diagnosis not present

## 2018-02-05 DIAGNOSIS — N3281 Overactive bladder: Secondary | ICD-10-CM | POA: Diagnosis not present

## 2018-02-05 DIAGNOSIS — Z01419 Encounter for gynecological examination (general) (routine) without abnormal findings: Secondary | ICD-10-CM | POA: Diagnosis not present

## 2018-02-05 DIAGNOSIS — Z1211 Encounter for screening for malignant neoplasm of colon: Secondary | ICD-10-CM | POA: Diagnosis not present

## 2018-02-12 ENCOUNTER — Ambulatory Visit (INDEPENDENT_AMBULATORY_CARE_PROVIDER_SITE_OTHER): Payer: BLUE CROSS/BLUE SHIELD | Admitting: Psychology

## 2018-02-12 DIAGNOSIS — F3131 Bipolar disorder, current episode depressed, mild: Secondary | ICD-10-CM | POA: Diagnosis not present

## 2018-02-17 DIAGNOSIS — D485 Neoplasm of uncertain behavior of skin: Secondary | ICD-10-CM | POA: Diagnosis not present

## 2018-02-17 DIAGNOSIS — Z1283 Encounter for screening for malignant neoplasm of skin: Secondary | ICD-10-CM | POA: Diagnosis not present

## 2018-02-17 DIAGNOSIS — L739 Follicular disorder, unspecified: Secondary | ICD-10-CM | POA: Diagnosis not present

## 2018-02-17 DIAGNOSIS — K130 Diseases of lips: Secondary | ICD-10-CM | POA: Diagnosis not present

## 2018-02-17 DIAGNOSIS — D225 Melanocytic nevi of trunk: Secondary | ICD-10-CM | POA: Diagnosis not present

## 2018-02-17 DIAGNOSIS — D229 Melanocytic nevi, unspecified: Secondary | ICD-10-CM | POA: Diagnosis not present

## 2018-02-18 DIAGNOSIS — M5136 Other intervertebral disc degeneration, lumbar region: Secondary | ICD-10-CM | POA: Diagnosis not present

## 2018-02-18 DIAGNOSIS — M9903 Segmental and somatic dysfunction of lumbar region: Secondary | ICD-10-CM | POA: Diagnosis not present

## 2018-02-18 DIAGNOSIS — M9905 Segmental and somatic dysfunction of pelvic region: Secondary | ICD-10-CM | POA: Diagnosis not present

## 2018-02-18 DIAGNOSIS — M955 Acquired deformity of pelvis: Secondary | ICD-10-CM | POA: Diagnosis not present

## 2018-02-19 ENCOUNTER — Encounter: Payer: Self-pay | Admitting: Family Medicine

## 2018-02-19 ENCOUNTER — Ambulatory Visit (INDEPENDENT_AMBULATORY_CARE_PROVIDER_SITE_OTHER): Payer: BLUE CROSS/BLUE SHIELD | Admitting: Family Medicine

## 2018-02-19 VITALS — BP 116/64 | HR 89 | Temp 97.7°F | Ht 66.0 in

## 2018-02-19 DIAGNOSIS — Z170 Estrogen receptor positive status [ER+]: Secondary | ICD-10-CM

## 2018-02-19 DIAGNOSIS — Z794 Long term (current) use of insulin: Secondary | ICD-10-CM | POA: Insufficient documentation

## 2018-02-19 DIAGNOSIS — Z6836 Body mass index (BMI) 36.0-36.9, adult: Secondary | ICD-10-CM

## 2018-02-19 DIAGNOSIS — F3130 Bipolar disorder, current episode depressed, mild or moderate severity, unspecified: Secondary | ICD-10-CM | POA: Diagnosis not present

## 2018-02-19 DIAGNOSIS — I471 Supraventricular tachycardia: Secondary | ICD-10-CM

## 2018-02-19 DIAGNOSIS — M546 Pain in thoracic spine: Secondary | ICD-10-CM | POA: Diagnosis not present

## 2018-02-19 DIAGNOSIS — E1165 Type 2 diabetes mellitus with hyperglycemia: Secondary | ICD-10-CM | POA: Diagnosis not present

## 2018-02-19 DIAGNOSIS — C50812 Malignant neoplasm of overlapping sites of left female breast: Secondary | ICD-10-CM

## 2018-02-19 DIAGNOSIS — K51919 Ulcerative colitis, unspecified with unspecified complications: Secondary | ICD-10-CM

## 2018-02-19 MED ORDER — METHOCARBAMOL 500 MG PO TABS: 500.0000 mg | ORAL_TABLET | Freq: Three times a day (TID) | ORAL | 1 refills | Status: DC | PRN

## 2018-02-19 NOTE — Assessment & Plan Note (Signed)
Followed by endocrinology

## 2018-02-19 NOTE — Assessment & Plan Note (Signed)
Need to avoid nsaids due to this condition

## 2018-02-19 NOTE — Assessment & Plan Note (Signed)
Per pt - medications were recently adjusted by her psychiatrist (seroquel)  She is getting used to it

## 2018-02-19 NOTE — Patient Instructions (Signed)
I think you have muscle spasms in neck/back  Try some heat for 10 minutes at a time Also stretches and massage  Generic robaxin as needed for spasm/pain -caution of sedation   We will refer you to PT Nicole Kindred) to see if they can get you out of spasm  If symptoms suddenly worsen or do not improve please alert [place]

## 2018-02-19 NOTE — Progress Notes (Signed)
Subjective:    Patient ID: Lindsay Fry, female    DOB: Dec 17, 1960, 58 y.o.   MRN: [id number]  HPI  Here for neck and back pain   Wt Readings from Last 3 Encounters:  12/01/17 212 lb (96.2 kg)  11/18/17 212 lb (96.2 kg)  11/05/17 211 lb (95.7 kg)   34.22 kg/m   Pain in neck and thoracic area -worse on the R  Worse when sitting and twisting Then has to lie down for 10-15 minutes   The last month - it has not responded to change in position  Or gabapentin or tylenol  Usually avoids nsaid due to UC (occ aleve)   No trauma that she knows of   No exacerbating position except for twisting  No pain at night in bed   Has tried ice -helps temporarily  Has not tried muscle rubs -can't reach Has not tried heat   Patient Active Problem List   Diagnosis Date Noted  . Thoracic back pain 02/19/2018  . Uncontrolled type 2 diabetes mellitus with hyperglycemia, with long-term current use of insulin ([place]) 02/19/2018  . Pleural thickening 11/05/2017  . Abrasion of right knee 11/05/2017  . Fall 11/05/2017  . Mouth ulcers 11/05/2017  . Cervical radiculopathy due to degenerative joint disease of spine 10/28/2017  . 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 Supraventricular tachycardia ([place]) 04/22/2017  . Osteopenia 09/25/2016  . Estrogen deficiency 05/30/2016  . Need for hepatitis C screening test 03/19/2016  . Screening for HIV (human immunodeficiency virus) 03/19/2016  . Carcinoma of overlapping sites of left breast in female, estrogen receptor positive ([place]) 08/21/2015  . Chronic constipation 01/18/2015  . NSTEMI (non-ST elevated myocardial infarction) ([place]) 09/06/2014  . CAD (coronary artery disease) 09/06/2014  . DM (diabetes mellitus) ([place]) 09/05/2014  . 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 Carcinoma of left breast ([place]) 06/02/2014  . Sinus tachycardia 04/27/2014  . Class 2 severe obesity due to excess calories with serious comorbidity and body mass index (BMI) of 36.0  to 36.9 in adult ([place]) 05/20/2013  . Bladder pain 02/17/2013  . ADD (attention deficit disorder) 11/19/2012  . Urinary frequency 08/05/2012  . Low back pain 08/05/2012  . 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 Chronic sinusitis 01/24/2012  . Vertigo, benign positional 01/24/2012  . Other screening mammogram 12/05/2010  . Routine general medical examination at a health care facility 10/14/2010  . FATIGUE 08/09/2009  . POSTMENOPAUSAL STATUS 06/22/2008  . Vitamin D deficiency 03/18/2008  . BENIGN NEOPLASM [place] SITE DIGESTIVE SYSTEM 01/09/2007  . HIATAL HERNIA 01/09/2007  . COLONIC POLYPS, ADENOMATOUS, HX OF 01/09/2007  . HYPERCHOLESTEROLEMIA 01/07/2007  . Bipolar disorder, unspecified ([place]) 01/07/2007  . Bipolar I disorder, most recent episode depressed ([place]) 01/07/2007  . Essential hypertension 01/07/2007  . ALLERGIC RHINITIS 01/07/2007  . ASTHMA 01/07/2007  . GERD 01/07/2007  . Ulcerative colitis ([place]) 01/07/2007  . ACNE ROSACEA 01/07/2007  . MIGRAINES, HX OF 01/07/2007  . Fibromyalgia 10/30/2006  . INSOMNIA 10/30/2006   Past Medical History:  Diagnosis Date  . ADD (attention deficit disorder) 11/19/2012  . Allergic rhinitis, cause unspecified   . Anxiety state, unspecified   . Arthritis   . Asymptomatic postmenopausal status (age-related) (natural)   . Atypical hyperplasia of left breast 2000  . Benign neoplasm of other and unspecified site of the digestive system   . Bipolar disorder ([place])   . Breast cancer ([place])    Left Breast 31m invasive CA left uiq, dcis left uoq  and a lot ADH, ALH in both breasts.  Marland Kitchen CAD (coronary artery disease)    a. 08/2014 NSTEMI/Cath: LM nl, LAD 50p, D1/D2 min irregs, LCX nl, OM1 40, LPDA nl, RI 99ost (2.33m vessel->med Rx), RCA nl, AM 60, nl EF; b. 11/2016 MV: EF 74%, no ischemia (performed 2/2 dyspnea & new inf TWI).  .Marland KitchenCarcinoma of left breast ([place] 06/02/2014  . Chronic diastolic CHF (congestive heart failure) ([place]    a. 08/2014  Echo: EF 60-65%, Gr 1 DD, nl LA.  .Marland KitchenClotting disorder ([place]    on blood thiner, Plavix  . Cyst    on Achilles tendon  . Depression   . Diabetes mellitus ([place]    frank  . Dysuria   . Edema   . Family history of malignant neoplasm of gastrointestinal tract   . Fibromyalgia   . GERD (gastroesophageal reflux disease)   . Glaucoma    pre glaucoma  . Headache    migraines  . Hiatal hernia   . History of alcoholism ([place]   . History of migraines   . History of ovarian cyst   . Insomnia, unspecified   . Myocardial infarction ([place] 09-04-14  . OCD (obsessive compulsive disorder)   . Osteopenia 09/25/2016   Femoral neck T -1.1  9/18  . Other screening mammogram   . Pure hypercholesterolemia   . Rosacea   . Subacute confusional state 11/19/2012  . Tubular adenoma of colon 01/10/11  . Ulcerative colitis, unspecified   . Unspecified asthma(493.90)   . Unspecified essential hypertension   . Unspecified hypothyroidism   . Unspecified vitamin D deficiency   . Vertigo    Past Surgical History:  Procedure Laterality Date  . APPENDECTOMY    . AXILLARY SENTINEL NODE BIOPSY Left 05/23/2014   Procedure: AXILLARY SENTINEL NODE BIOPSY;  Surgeon: SChristene Lye MD;  Location: [facility] ORS;  Service: General;  Laterality: Left;  . BREAST BIOPSY  Aug 2000   on tamoxifen, atypical hyperplasia  . BREAST SURGERY Left Aug 2000   lumpectomy/ Dr CSharlet Salina . BREAST SURGERY Bilateral 05/23/14   Mastectomy  . BUNIONECTOMY Right   . CARDIAC CATHETERIZATION N/A 09/05/2014   Procedure: Left Heart Cath and Coronary Angiography;  Surgeon: MWellington Hampshire MD;  [facility];  Service: Cardiovascular;  Laterality: N/A;  . CARDIAC CATHETERIZATION  09-05-14  . CATARACT EXTRACTION W/ INTRAOCULAR LENS IMPLANT    . CATARACT EXTRACTION W/ INTRAOCULAR LENS IMPLANT Bilateral   . CATARACT EXTRACTION, BILATERAL    . CESAREAN SECTION  1996/1997   placenta previa, gest DM, pre-eclampsia  .  cholecystect  1999  . CHOLECYSTECTOMY  1997   adhesions also  . COLONOSCOPY  01/2001   Ulcerative colitis  . COLONOSCOPY  04/2004   UC, polyp  . COLONOSCOPY  12/08   UC, no polyps  . DEXA  04/1999 and 2010   normal  . ESOPHAGOGASTRODUODENOSCOPY  09/2001   polyp  . exercise stress test  11/2003   negative  . FEMUR FRACTURE SURGERY Left   . MASTECTOMY Bilateral 05-23-14   Dr. SJamal Collin . MECKEL DIVERTICULUM EXCISION  1999  . NASAL SINUS SURGERY  05/1997  . nuclear stress test  06/2007   negative  . precancerous mole removed    . SIMPLE MASTECTOMY WITH AXILLARY SENTINEL NODE BIOPSY Bilateral 05/23/2014   Procedure: Bilateral simple mastectomy, left sentinel node biopsy ;  Surgeon: SChristene Lye MD;  Location: [facility] ORS;  Service: General;  Laterality: Bilateral;  . Sleep study  11/09   no apnea, but did snore (done by [facility])  . TONSILLECTOMY  1984  . TUBAL LIGATION    . WISDOM TOOTH EXTRACTION  1980's   Social History   Tobacco Use  . Smoking status: Former Smoker    Packs/day: 0.25    Years: 5.00    Pack years: 1.25    Types: Cigarettes    Last attempt to quit: 01/14/1993    Years since quitting: 25.1  . Smokeless tobacco: Former Network engineer Use Topics  . Alcohol use: No    Alcohol/week: 0.0 standard drinks    Comment: Recovered ETOH  . Drug use: No   Family History  Problem Relation Age of Onset  . Coronary artery disease Father   . Colon cancer Father   . Alzheimer's disease Father   . Dementia Father   . Coronary artery disease Mother   . Hypertension Mother   . Osteoporosis Mother   . Colitis Mother   . Crohn's disease Mother   . Breast cancer Unknown        great aunts  . Coronary artery disease Unknown        Uncle (also AAA)  . Diabetes Unknown        remote family history  . Stroke Cousin   . Esophageal cancer Neg Hx   . Rectal cancer Neg Hx   . Stomach cancer Neg Hx    Allergies  Allergen Reactions  . Ephedrine Other (See Comments)     Pt becomes hyper  . Aspartame And Phenylalanine Nausea Only  . Aspirin     REACTION: aggrivates colitis  . Crestor [Rosuvastatin]     REACTION: increased lfts  . Erythromycin     REACTION: GI upset  . Nsaids     REACTION: aggrivate colitis  . Other Other (See Comments)    Unable to take due to history of ulcerative colitis  . Oxycodone Other (See Comments)    Panic Attack   Current Outpatient Medications on File Prior to Visit  Medication Sig Dispense Refill  . amantadine (SYMMETREL) 100 MG capsule Take 100 mg by mouth 2 (two) times daily.    Marland Kitchen atorvastatin (LIPITOR) 80 MG tablet TAKE 1 TABLET(80 MG) BY MOUTH DAILY 90 tablet 3  . b complex vitamins capsule Take 1 capsule by mouth daily.    . B-D UF III MINI PEN NEEDLES 31G X 5 MM MISC USE FOR INJECTIONS TWICE DAILY 100 each 0  . BAYER MICROLET LANCETS lancets USE AS DIRECTED TO CHECK BLOOD SUGAR TWICE DAILY 200 each 2  . Blood Glucose Monitoring Suppl (CONTOUR NEXT EZ MONITOR) w/Device KIT USE AS DIRECTED TO CHECK BLOOD SUGAR TWICE DAILY 1 kit 2  . Calcium Carb-Cholecalciferol (CALCIUM PLUS VITAMIN D3) 600-800 MG-UNIT TABS Take 1 tablet by mouth daily.    . canagliflozin (INVOKANA) 300 MG TABS tablet TAKE 1 TABLET(300 MG) BY MOUTH DAILY BEFORE BREAKFAST 30 tablet 3  . chlorproMAZINE (THORAZINE) 25 MG tablet Take 25-50 mg by mouth as needed (migraines).     . clopidogrel (PLAVIX) 75 MG tablet TAKE 1 TABLET(75 MG) BY MOUTH DAILY 90 tablet 0  . co-enzyme Q-10 50 MG capsule Take 50 mg by mouth daily.    . diazepam (VALIUM) 10 MG tablet Take 2.5-10 mg by mouth as needed for anxiety.    Marland Kitchen diltiazem (CARDIZEM CD) 240 MG 24 hr capsule TAKE 1 CAPSULE(240 MG) BY MOUTH DAILY 90  capsule 3  . diphenoxylate-atropine (LOMOTIL) 2.5-0.025 MG tablet Take 1-2 tablets by mouth 3 times a day as needed for diarrhea 90 tablet 0  . diphenoxylate-atropine (LOMOTIL) 2.5-0.025 MG tablet Take 1-2 tabs daily as needed for diarrhea 50 tablet 0  . FIBER SELECT  GUMMIES PO Take 3 capsules by mouth daily.    Marland Kitchen gabapentin (NEURONTIN) 300 MG capsule TAKE 1 CAPSULE(300 MG) BY MOUTH THREE TIMES DAILY 90 capsule 11  . glucose blood (BAYER CONTOUR NEXT TEST) test strip USE AS DIRECTED TO CHECK BLOOD SUGAR TWICE DAILY 200 each 2  . Insulin Detemir (LEVEMIR FLEXTOUCH) 100 UNIT/ML Pen Inject 50 Units into the skin at bedtime. INJECT 50 UNITS UNDER THE SKIN DAILY AT 10 PM 5 pen 3  . Insulin Pen Needle (B-D UF III MINI PEN NEEDLES) 31G X 5 MM MISC USE FOR INJECTIONS TWICE DAILY 100 each 0  . lamoTRIgine (LAMICTAL) 200 MG tablet Take 400 mg by mouth daily.    Marland Kitchen latanoprost (XALATAN) 0.005 % ophthalmic solution Place 1 drop into both eyes At bedtime.    Marland Kitchen letrozole (FEMARA) 2.5 MG tablet Take 1 tablet (2.5 mg total) by mouth daily. 30 tablet 12  . LINZESS 290 MCG CAPS capsule TAKE 1 CAPSULE(290 MCG) BY MOUTH DAILY BEFORE BREAKFAST 30 capsule 6  . lithium carbonate (ESKALITH) 450 MG CR tablet Take 900 mg by mouth at bedtime.    . Melatonin 10 MG CAPS Take 1 capsule by mouth at bedtime.    . mesalamine (LIALDA) 1.2 g EC tablet TAKE 2 TABLETS BY MOUTH EVERY DAY 60 tablet 2  . metoprolol succinate (TOPROL-XL) 50 MG 24 hr tablet Take 50 mg by mouth daily.   3  . Multiple Vitamin (MULTIVITAMIN) tablet Take 1 tablet by mouth daily.      . nitrofurantoin (MACRODANTIN) 100 MG capsule Take 100 mg by mouth as directed. Reported on 03/02/2015    . Omega-3 Krill Oil 500 MG CAPS Take 500 mg by mouth.    . pantoprazole (PROTONIX) 40 MG tablet TAKE 1 TABLET(40 MG) BY MOUTH TWICE DAILY 60 tablet 3  . Probiotic Product (PROBIOTIC DAILY PO) Take by mouth.    . QUEtiapine (SEROQUEL XR) 300 MG 24 hr tablet TK 2 TS PO QHS  12  . QUEtiapine (SEROQUEL XR) 400 MG 24 hr tablet Take 800 mg by mouth at bedtime.    . ranitidine (ZANTAC) 300 MG tablet Take 0.5 tablets (150 mg total) by mouth 2 (two) times daily. 60 tablet 3  . Semaglutide (OZEMPIC) 1 MG/DOSE SOPN Inject 1 mg into the skin once a  week. 4 pen 3  . Simethicone (GAS-X PO) Take 1 tablet by mouth 4 (four) times daily.    . solifenacin (VESICARE) 5 MG tablet Take 1 tablet by mouth daily.    Marland Kitchen SYNTHROID 200 MCG tablet TAKE 1 TABLET(200 MCG) BY MOUTH DAILY BEFORE BREAKFAST 30 tablet 0  . triamcinolone (KENALOG) 0.1 % paste Use as directed 1 application in the mouth or throat 2 (two) times daily. To affected areas 5 g 1  . valACYclovir (VALTREX) 1000 MG tablet Take 1 tablet (1,000 mg total) by mouth daily. 30 tablet 5  . vitamin C (ASCORBIC ACID) 500 MG tablet Take 250 mg by mouth daily. Takes 2 a day    . VYVANSE 60 MG capsule TK ONE C PO QAM  0  . zaleplon (SONATA) 10 MG capsule Take 20 mg by mouth at bedtime as needed for sleep.  No current facility-administered medications on file prior to visit.      Review of Systems  Constitutional: Positive for fatigue. Negative for activity change, appetite change, fever and unexpected weight change.  HENT: Negative for congestion, ear pain, rhinorrhea, sinus pressure and sore throat.   Eyes: Negative for pain, redness and visual disturbance.  Respiratory: Negative for cough, shortness of breath and wheezing.   Cardiovascular: Negative for chest pain and palpitations.  Gastrointestinal: Negative for abdominal pain, blood in stool, constipation and diarrhea.  Endocrine: Negative for polydipsia and polyuria.  Genitourinary: Negative for dysuria, frequency and urgency.  Musculoskeletal: Positive for back pain and neck pain. Negative for arthralgias, gait problem, myalgias and neck stiffness.  Skin: Negative for pallor and rash.  Allergic/Immunologic: Negative for environmental allergies.  Neurological: Negative for dizziness, syncope, weakness, light-headedness, numbness and headaches.       No focal numbness or weakness  Hematological: Negative for adenopathy. Does not bruise/bleed easily.  Psychiatric/Behavioral: Positive for dysphoric mood. Negative for decreased concentration.  The patient is not nervous/anxious.        Bipolar        Objective:   Physical Exam Constitutional:      General: She is not in acute distress.    Appearance: Normal appearance. She is obese. She is not ill-appearing.  HENT:     Head: Normocephalic and atraumatic.     Mouth/Throat:     Mouth: Mucous membranes are moist.  Eyes:     Extraocular Movements: Extraocular movements intact.     Conjunctiva/sclera: Conjunctivae normal.     Pupils: Pupils are equal, round, and reactive to light.  Neck:     Musculoskeletal: Normal range of motion and neck supple. Muscular tenderness present. No neck rigidity.     Comments: Some tenderness of cervical musculature- worse on the R  Nl flex/ext  More pain to rotate to the R No bony tenderness Cardiovascular:     Rate and Rhythm: Normal rate and regular rhythm.     Pulses: Normal pulses.     Heart sounds: Normal heart sounds.  Pulmonary:     Effort: Pulmonary effort is normal. No respiratory distress.     Breath sounds: Normal breath sounds. No wheezing.  Musculoskeletal:        General: Tenderness present. No swelling or deformity.     Thoracic back: She exhibits decreased range of motion, tenderness and spasm. She exhibits no bony tenderness, no edema and no deformity.     Comments: Spasm with tenderness over trapezius -worse on R Especially in rhomboid area (worse on R also)   Nl rom of head and shoulders  Pain with ext of TS No skin changes No neuro changes   Lymphadenopathy:     Cervical: No cervical adenopathy.  Skin:    General: Skin is warm and dry.     Findings: No rash.  Neurological:     General: No focal deficit present.     Mental Status: She is alert. Mental status is at baseline.     Motor: No weakness.     Coordination: Coordination normal.     Gait: Gait normal.  Psychiatric:        Mood and Affect: Mood normal.     Comments: Pleasant  Seemingly mildly sedated today (husband is here with her) - per pt getting  used to new seroquel dose           Assessment & Plan:   Problem List Items Addressed This  Visit      Cardiovascular and Mediastinum   Supraventricular tachycardia ([place])    Not active currently  Followed by cardiology        Digestive   Ulcerative colitis ([place])    Need to avoid nsaids due to this condition         Endocrine   Uncontrolled type 2 diabetes mellitus with hyperglycemia, with long-term current use of insulin ([place])    Followed by endocrinology        Other   Bipolar I disorder, most recent episode depressed ([place])    Per pt - medications were recently adjusted by her psychiatrist (seroquel)  She is getting used to it       Class 2 severe obesity due to excess calories with serious comorbidity and body mass index (BMI) of 36.0 to 36.9 in adult [facility])   Carcinoma of left breast ([place])    Doing well with f/u of this  No re occurance      Thoracic back pain - Primary    Acute on chronic- neck and thoracic area with notable trapezius spasm (and suspected rhomboid on the R)  Px robaxin to use with caution  Heat/massage / gentle stretching  PT ordered  inst to alert if symptoms suddenly worsen or if neuro changes       Relevant Medications   methocarbamol (ROBAXIN) 500 MG tablet   Other Relevant Orders   Ambulatory referral to Physical Therapy

## 2018-02-19 NOTE — Assessment & Plan Note (Signed)
Acute on chronic- neck and thoracic area with notable trapezius spasm (and suspected rhomboid on the R)  Px robaxin to use with caution  Heat/massage / gentle stretching  PT ordered  inst to alert if symptoms suddenly worsen or if neuro changes

## 2018-02-19 NOTE — Assessment & Plan Note (Signed)
Not active currently  Followed by cardiology

## 2018-02-19 NOTE — Assessment & Plan Note (Signed)
Doing well with f/u of this  No re occurance

## 2018-02-24 ENCOUNTER — Other Ambulatory Visit: Payer: Self-pay | Admitting: Endocrinology

## 2018-02-25 NOTE — Telephone Encounter (Signed)
Please advise 

## 2018-02-27 ENCOUNTER — Other Ambulatory Visit: Payer: Self-pay

## 2018-02-27 ENCOUNTER — Other Ambulatory Visit: Payer: Self-pay | Admitting: *Deleted

## 2018-02-27 ENCOUNTER — Inpatient Hospital Stay: Payer: BLUE CROSS/BLUE SHIELD | Attending: Internal Medicine

## 2018-02-27 ENCOUNTER — Inpatient Hospital Stay (HOSPITAL_BASED_OUTPATIENT_CLINIC_OR_DEPARTMENT_OTHER): Payer: BLUE CROSS/BLUE SHIELD | Admitting: Internal Medicine

## 2018-02-27 DIAGNOSIS — Z170 Estrogen receptor positive status [ER+]: Secondary | ICD-10-CM

## 2018-02-27 DIAGNOSIS — E039 Hypothyroidism, unspecified: Secondary | ICD-10-CM | POA: Insufficient documentation

## 2018-02-27 DIAGNOSIS — E119 Type 2 diabetes mellitus without complications: Secondary | ICD-10-CM | POA: Diagnosis not present

## 2018-02-27 DIAGNOSIS — C50812 Malignant neoplasm of overlapping sites of left female breast: Secondary | ICD-10-CM | POA: Diagnosis not present

## 2018-02-27 DIAGNOSIS — Z87891 Personal history of nicotine dependence: Secondary | ICD-10-CM | POA: Insufficient documentation

## 2018-02-27 DIAGNOSIS — Z9013 Acquired absence of bilateral breasts and nipples: Secondary | ICD-10-CM | POA: Insufficient documentation

## 2018-02-27 LAB — CBC WITH DIFFERENTIAL/PLATELET
Abs Immature Granulocytes: 0.02 10*3/uL (ref 0.00–0.07)
Basophils Absolute: 0.1 10*3/uL (ref 0.0–0.1)
Basophils Relative: 1 %
Eosinophils Absolute: 0.3 10*3/uL (ref 0.0–0.5)
Eosinophils Relative: 4 %
HCT: 45.5 % (ref 36.0–46.0)
Hemoglobin: 14.7 g/dL (ref 12.0–15.0)
Immature Granulocytes: 0 %
Lymphocytes Relative: 35 %
Lymphs Abs: 2.4 10*3/uL (ref 0.7–4.0)
MCH: 30.6 pg (ref 26.0–34.0)
MCHC: 32.3 g/dL (ref 30.0–36.0)
MCV: 94.6 fL (ref 80.0–100.0)
Monocytes Absolute: 0.5 10*3/uL (ref 0.1–1.0)
Monocytes Relative: 8 %
Neutro Abs: 3.6 10*3/uL (ref 1.7–7.7)
Neutrophils Relative %: 52 %
Platelets: 301 10*3/uL (ref 150–400)
RBC: 4.81 MIL/uL (ref 3.87–5.11)
RDW: 13.7 % (ref 11.5–15.5)
WBC: 6.9 10*3/uL (ref 4.0–10.5)
nRBC: 0 % (ref 0.0–0.2)

## 2018-02-27 LAB — COMPREHENSIVE METABOLIC PANEL
ALT: 54 U/L — ABNORMAL HIGH (ref 0–44)
AST: 51 U/L — ABNORMAL HIGH (ref 15–41)
Albumin: 4.2 g/dL (ref 3.5–5.0)
Alkaline Phosphatase: 73 U/L (ref 38–126)
Anion gap: 8 (ref 5–15)
BUN: 9 mg/dL (ref 6–20)
CO2: 25 mmol/L (ref 22–32)
Calcium: 9.4 mg/dL (ref 8.9–10.3)
Chloride: 107 mmol/L (ref 98–111)
Creatinine, Ser: 0.86 mg/dL (ref 0.44–1.00)
GFR calc Af Amer: >60 mL/min (ref >60)
GFR calc non Af Amer: >60 mL/min (ref >60)
Glucose, Bld: 170 mg/dL — ABNORMAL HIGH (ref 70–99)
Potassium: 3.5 mmol/L (ref 3.5–5.1)
Sodium: 140 mmol/L (ref 135–145)
Total Bilirubin: 0.9 mg/dL (ref 0.3–1.2)
Total Protein: 7.1 g/dL (ref 6.5–8.1)

## 2018-02-27 NOTE — Progress Notes (Signed)
[facility] OFFICE PROGRESS NOTE  Patient Care Team: Tower, Marne A, MD as PCP - General Gollan, Timothy J, MD as PCP - Cardiology (Cardiology) Kumar, Ajay, MD as Consulting Physician (Endocrinology) Kaur, Rupinder, MD as Consulting Physician (Psychiatry) Rosenow, Philip J, MD (Obstetrics and Gynecology) Sankar, Seeplaputhur G, MD (General Surgery) Gollan, Timothy J, MD as Consulting Physician (Cardiology)  Cancer Staging Carcinoma of left breast (HCC) Staging form: Breast, AJCC 7th Edition - Clinical: Stage IA (T1b, N0, M0) - Unsigned Staging comments: Kidney frequent changes in the 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  No invasive cancer in the right breast.  Status post bilateral mastectomy    Oncology History   1.  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  2, April of 2016 patient had  stereotactic biopsy of abnormal breast lesion in the left breastwhich was positive for invasive carcinoma and ductal carcinoma in situ.  Patient underwent bilateral mastectomy  May 9 , 2016  Patient has invasive carcinoma and left breast with significant changes in the right breast ductal carcinoma in situ patient underwent bilateral mastectomy.  Left breast: T1 b N0 M0 stage IB estrogen and progesterone receptor HER-2/neu- Not overexpressed  2.    Multi-gene analysis with  MAMOPRINT low risk for recurrent disease  Patient was started on letrozole and calcium and vitamin D (June, 2016)   # Hx Anxiety/depression [Dr.kaur; [place]]; Hx of colitis [s/p colo- May 2019]; Obesity -----------------------------------------------------   DIAGNOSIS:LEFT BREAST CA ER-POS  STAGE: I        ;GOALS: curative  CURRENT/MOST RECENT THERAPY: Letrozole      Carcinoma of left breast (HCC)   05/23/2014 Initial Diagnosis    Carcinoma of left breast     Carcinoma of overlapping sites of left breast  in female, estrogen receptor positive (HCC)      INTERVAL HISTORY:  Lindsay Fry 58 y.o.  female pleasant patient above history of stage I breast cancer ER PR positive HER-2 negative currently on letrozole is here for follow-up.  Patient continues to have chronic intermittent joint pains back pain which is not any worse.  Chronic mild hot flashes.  Stable.  Chronic mild fatigue.    Review of Systems  Constitutional: Positive for malaise/fatigue. Negative for chills, diaphoresis, fever and weight loss.  HENT: Negative for nosebleeds and sore throat.   Eyes: Negative for double vision.  Respiratory: Negative for cough, hemoptysis, sputum production, shortness of breath and wheezing.   Cardiovascular: Negative for chest pain, palpitations, orthopnea and leg swelling.  Gastrointestinal: Negative for abdominal pain, blood in stool, constipation, heartburn, melena, nausea and vomiting.  Genitourinary: Negative for dysuria, frequency and urgency.  Musculoskeletal: Positive for back pain and joint pain.  Skin: Negative.  Negative for itching and rash.  Neurological: Negative for dizziness, tingling, focal weakness, weakness and headaches.  Endo/Heme/Allergies: Does not bruise/bleed easily.  Psychiatric/Behavioral: Positive for depression. The patient is nervous/anxious and has insomnia.       PAST MEDICAL HISTORY :  Past Medical History:  Diagnosis Date  . ADD (attention deficit disorder) 11/19/2012  . Allergic rhinitis, cause unspecified   . Anxiety state, unspecified   . Arthritis   . Asymptomatic postmenopausal status (age-related) (natural)   . Atypical hyperplasia of left breast 2000  . Benign neoplasm of other and unspecified site of the digestive system   . Bipolar disorder (HCC)   .   Breast cancer (HCC)    Left Breast 6mm invasive CA left uiq, dcis left uoq and a lot ADH, ALH in both breasts.  . CAD (coronary artery disease)    a. 08/2014 NSTEMI/Cath: LM nl, LAD 50p,  D1/D2 min irregs, LCX nl, OM1 40, LPDA nl, RI 99ost (2.0mm vessel->med Rx), RCA nl, AM 60, nl EF; b. 11/2016 MV: EF 74%, no ischemia (performed 2/2 dyspnea & new inf TWI).  . Carcinoma of left breast (HCC) 06/02/2014  . Chronic diastolic CHF (congestive heart failure) (HCC)    a. 08/2014 Echo: EF 60-65%, Gr 1 DD, nl LA.  . Clotting disorder (HCC)    on blood thiner, Plavix  . Cyst    on Achilles tendon  . Depression   . Diabetes mellitus (HCC)    frank  . Dysuria   . Edema   . Family history of malignant neoplasm of gastrointestinal tract   . Fibromyalgia   . GERD (gastroesophageal reflux disease)   . Glaucoma    pre glaucoma  . Headache    migraines  . Hiatal hernia   . History of alcoholism (HCC)   . History of migraines   . History of ovarian cyst   . Insomnia, unspecified   . Myocardial infarction (HCC) 09-04-14  . OCD (obsessive compulsive disorder)   . Osteopenia 09/25/2016   Femoral neck T -1.1  9/18  . Other screening mammogram   . Pure hypercholesterolemia   . Rosacea   . Subacute confusional state 11/19/2012  . Tubular adenoma of colon 01/10/11  . Ulcerative colitis, unspecified   . Unspecified asthma(493.90)   . Unspecified essential hypertension   . Unspecified hypothyroidism   . Unspecified vitamin D deficiency   . Vertigo     PAST SURGICAL HISTORY :   Past Surgical History:  Procedure Laterality Date  . APPENDECTOMY    . AXILLARY SENTINEL NODE BIOPSY Left 05/23/2014   Procedure: AXILLARY SENTINEL NODE BIOPSY;  Surgeon: Seeplaputhur G Sankar, MD;  Location: [facility] ORS;  Service: General;  Laterality: Left;  . BREAST BIOPSY  Aug 2000   on tamoxifen, atypical hyperplasia  . BREAST SURGERY Left Aug 2000   lumpectomy/ Dr Crawford  . BREAST SURGERY Bilateral 05/23/14   Mastectomy  . BUNIONECTOMY Right   . CARDIAC CATHETERIZATION N/A 09/05/2014   Procedure: Left Heart Cath and Coronary Angiography;  Surgeon: Muhammad A Arida, MD;  Location: [facility] INVASIVE CV LAB;   Service: Cardiovascular;  Laterality: N/A;  . CARDIAC CATHETERIZATION  09-05-14  . CATARACT EXTRACTION W/ INTRAOCULAR LENS IMPLANT    . CATARACT EXTRACTION W/ INTRAOCULAR LENS IMPLANT Bilateral   . CATARACT EXTRACTION, BILATERAL    . CESAREAN SECTION  1996/1997   placenta previa, gest DM, pre-eclampsia  . cholecystect  1999  . CHOLECYSTECTOMY  1997   adhesions also  . COLONOSCOPY  01/2001   Ulcerative colitis  . COLONOSCOPY  04/2004   UC, polyp  . COLONOSCOPY  12/08   UC, no polyps  . DEXA  04/1999 and 2010   normal  . ESOPHAGOGASTRODUODENOSCOPY  09/2001   polyp  . exercise stress test  11/2003   negative  . FEMUR FRACTURE SURGERY Left   . MASTECTOMY Bilateral 05-23-14   Dr. Sankar  . MECKEL DIVERTICULUM EXCISION  1999  . NASAL SINUS SURGERY  05/1997  . nuclear stress test  06/2007   negative  . precancerous mole removed    . SIMPLE MASTECTOMY WITH AXILLARY SENTINEL NODE BIOPSY Bilateral 05/23/2014     Procedure: Bilateral simple mastectomy, left sentinel node biopsy ;  Surgeon: Christene Lye, MD;  Location: [facility] ORS;  Service: General;  Laterality: Bilateral;  . Sleep study  11/09   no apnea, but did snore (done by [facility])  . TONSILLECTOMY  1984  . TUBAL LIGATION    . WISDOM TOOTH EXTRACTION  1980's    FAMILY HISTORY :   Family History  Problem Relation Age of Onset  . Coronary artery disease Father   . Colon cancer Father   . Alzheimer's disease Father   . Dementia Father   . Coronary artery disease Mother   . Hypertension Mother   . Osteoporosis Mother   . Colitis Mother   . Crohn's disease Mother   . Breast cancer Unknown        great aunts  . Coronary artery disease Unknown        Uncle (also AAA)  . Diabetes Unknown        remote family history  . Stroke Cousin   . Esophageal cancer Neg Hx   . Rectal cancer Neg Hx   . Stomach cancer Neg Hx     SOCIAL HISTORY:   Social History   Tobacco Use  . Smoking status: Former Smoker    Packs/day: 0.25     Years: 5.00    Pack years: 1.25    Types: Cigarettes    Last attempt to quit: 01/14/1993    Years since quitting: 25.1  . Smokeless tobacco: Former Network engineer Use Topics  . Alcohol use: No    Alcohol/week: 0.0 standard drinks    Comment: Recovered ETOH  . Drug use: No    ALLERGIES:  is allergic to ephedrine; aspartame and phenylalanine; aspirin; crestor [rosuvastatin]; erythromycin; nsaids; other; and oxycodone.  MEDICATIONS:  Current Outpatient Medications  Medication Sig Dispense Refill  . amantadine (SYMMETREL) 100 MG capsule Take 100 mg by mouth 2 (two) times daily.    Marland Kitchen atorvastatin (LIPITOR) 80 MG tablet TAKE 1 TABLET(80 MG) BY MOUTH DAILY 90 tablet 3  . b complex vitamins capsule Take 1 capsule by mouth daily.    . B-D UF III MINI PEN NEEDLES 31G X 5 MM MISC USE FOR INJECTIONS TWICE DAILY 100 each 0  . BAYER MICROLET LANCETS lancets USE AS DIRECTED TO CHECK BLOOD SUGAR TWICE DAILY 200 each 2  . Blood Glucose Monitoring Suppl (CONTOUR NEXT EZ MONITOR) w/Device KIT USE AS DIRECTED TO CHECK BLOOD SUGAR TWICE DAILY 1 kit 2  . Calcium Carb-Cholecalciferol (CALCIUM PLUS VITAMIN D3) 600-800 MG-UNIT TABS Take 1 tablet by mouth daily.    . canagliflozin (INVOKANA) 300 MG TABS tablet TAKE 1 TABLET(300 MG) BY MOUTH DAILY BEFORE BREAKFAST 30 tablet 3  . chlorproMAZINE (THORAZINE) 25 MG tablet Take 25-50 mg by mouth as needed (migraines).     . clopidogrel (PLAVIX) 75 MG tablet TAKE 1 TABLET(75 MG) BY MOUTH DAILY 90 tablet 0  . co-enzyme Q-10 50 MG capsule Take 50 mg by mouth daily.    . diazepam (VALIUM) 10 MG tablet Take 2.5-10 mg by mouth as needed for anxiety.    Marland Kitchen diltiazem (CARDIZEM CD) 240 MG 24 hr capsule TAKE 1 CAPSULE(240 MG) BY MOUTH DAILY 90 capsule 3  . diphenoxylate-atropine (LOMOTIL) 2.5-0.025 MG tablet Take 1-2 tabs daily as needed for diarrhea 50 tablet 0  . FIBER SELECT GUMMIES PO Take 3 capsules by mouth daily.    Marland Kitchen gabapentin (NEURONTIN) 300 MG capsule TAKE 1  CAPSULE(300  MG) BY MOUTH THREE TIMES DAILY 90 capsule 11  . glucose blood (BAYER CONTOUR NEXT TEST) test strip USE AS DIRECTED TO CHECK BLOOD SUGAR TWICE DAILY 200 each 2  . Insulin Detemir (LEVEMIR FLEXTOUCH) 100 UNIT/ML Pen Inject 50 Units into the skin at bedtime. INJECT 50 UNITS UNDER THE SKIN DAILY AT 10 PM 5 pen 3  . Insulin Pen Needle (B-D UF III MINI PEN NEEDLES) 31G X 5 MM MISC USE FOR INJECTIONS TWICE DAILY 100 each 0  . lamoTRIgine (LAMICTAL) 200 MG tablet Take 400 mg by mouth daily.    . latanoprost (XALATAN) 0.005 % ophthalmic solution Place 1 drop into both eyes At bedtime.    . letrozole (FEMARA) 2.5 MG tablet Take 1 tablet (2.5 mg total) by mouth daily. 30 tablet 12  . LINZESS 290 MCG CAPS capsule TAKE 1 CAPSULE(290 MCG) BY MOUTH DAILY BEFORE BREAKFAST 30 capsule 6  . lithium carbonate (ESKALITH) 450 MG CR tablet Take 900 mg by mouth at bedtime.    . Melatonin 10 MG CAPS Take 1 capsule by mouth at bedtime.    . mesalamine (LIALDA) 1.2 g EC tablet TAKE 2 TABLETS BY MOUTH EVERY DAY 60 tablet 2  . methocarbamol (ROBAXIN) 500 MG tablet Take 1 tablet (500 mg total) by mouth 3 (three) times daily as needed for muscle spasms (back pain). Caution of sedation 30 tablet 1  . metoprolol succinate (TOPROL-XL) 50 MG 24 hr tablet Take 50 mg by mouth daily.   3  . Multiple Vitamin (MULTIVITAMIN) tablet Take 1 tablet by mouth daily.      . nitrofurantoin (MACRODANTIN) 100 MG capsule Take 100 mg by mouth as directed. Reported on 03/02/2015    . Omega-3 Krill Oil 500 MG CAPS Take 500 mg by mouth.    . pantoprazole (PROTONIX) 40 MG tablet TAKE 1 TABLET(40 MG) BY MOUTH TWICE DAILY 60 tablet 3  . Probiotic Product (PROBIOTIC DAILY PO) Take by mouth.    . QUEtiapine (SEROQUEL XR) 400 MG 24 hr tablet Take 800 mg by mouth at bedtime.    . Semaglutide (OZEMPIC) 1 MG/DOSE SOPN Inject 1 mg into the skin once a week. 4 pen 3  . Simethicone (GAS-X PO) Take 1 tablet by mouth 4 (four) times daily.    .  solifenacin (VESICARE) 5 MG tablet Take 1 tablet by mouth daily.    . SYNTHROID 200 MCG tablet TAKE 1 TABLET(200 MCG) BY MOUTH DAILY BEFORE BREAKFAST 30 tablet 0  . valACYclovir (VALTREX) 1000 MG tablet Take 1 tablet (1,000 mg total) by mouth daily. 30 tablet 5  . vitamin C (ASCORBIC ACID) 500 MG tablet Take 250 mg by mouth daily. Takes 2 a day    . VYVANSE 60 MG capsule Take 60 mg by mouth every morning.   0  . zaleplon (SONATA) 10 MG capsule Take 20 mg by mouth at bedtime as needed for sleep.    . ranitidine (ZANTAC) 300 MG tablet Take 0.5 tablets (150 mg total) by mouth 2 (two) times daily. (Patient not taking: Reported on 02/27/2018) 60 tablet 3  . triamcinolone (KENALOG) 0.1 % paste Use as directed 1 application in the mouth or throat 2 (two) times daily. To affected areas (Patient not taking: Reported on 02/27/2018) 5 g 1   No current facility-administered medications for this visit.     PHYSICAL EXAMINATION: ECOG PERFORMANCE STATUS: 0 - Asymptomatic  BP 131/76 (BP Location: Right Arm, Patient Position: Sitting)   Pulse 89     Temp 97.9 F (36.6 C) (Tympanic)   Resp 18   Ht 5' 6" (1.676 m)   Wt 214 lb (97.1 kg)   LMP 08/23/2006 (Approximate) Comment: tubal ligation  BMI 34.54 kg/m   Filed Weights   02/27/18 1434  Weight: 214 lb (97.1 kg)    Physical Exam  Constitutional: She is oriented to person, place, and time and well-developed, well-nourished, and in no distress.  HENT:  Head: Normocephalic and atraumatic.  Mouth/Throat: Oropharynx is clear and moist. No oropharyngeal exudate.  Eyes: Pupils are equal, round, and reactive to light.  Neck: Normal range of motion. Neck supple.  Cardiovascular: Normal rate and regular rhythm.  Pulmonary/Chest: No respiratory distress. She has no wheezes.  Abdominal: Soft. Bowel sounds are normal. She exhibits no distension and no mass. There is no abdominal tenderness. There is no rebound and no guarding.  Musculoskeletal: Normal range of  motion.        General: No tenderness or edema.  Neurological: She is alert and oriented to person, place, and time.  Skin: Skin is warm.  Bilateral mastectomy noted.  No lumps or bumps.  Psychiatric: Affect normal.       LABORATORY DATA:  I have reviewed the data as listed    Component Value Date/Time   NA 140 02/27/2018 1357   K 3.5 02/27/2018 1357   CL 107 02/27/2018 1357   CO2 25 02/27/2018 1357   GLUCOSE 170 (H) 02/27/2018 1357   BUN 9 02/27/2018 1357   CREATININE 0.86 02/27/2018 1357   CALCIUM 9.4 02/27/2018 1357   PROT 7.1 02/27/2018 1357   ALBUMIN 4.2 02/27/2018 1357   AST 51 (H) 02/27/2018 1357   ALT 54 (H) 02/27/2018 1357   ALKPHOS 73 02/27/2018 1357   BILITOT 0.9 02/27/2018 1357   GFRNONAA >60 02/27/2018 1357   GFRAA >60 02/27/2018 1357    No results found for: SPEP, UPEP  Lab Results  Component Value Date   WBC 6.9 02/27/2018   NEUTROABS 3.6 02/27/2018   HGB 14.7 02/27/2018   HCT 45.5 02/27/2018   MCV 94.6 02/27/2018   PLT 301 02/27/2018      Chemistry      Component Value Date/Time   NA 140 02/27/2018 1357   K 3.5 02/27/2018 1357   CL 107 02/27/2018 1357   CO2 25 02/27/2018 1357   BUN 9 02/27/2018 1357   CREATININE 0.86 02/27/2018 1357      Component Value Date/Time   CALCIUM 9.4 02/27/2018 1357   ALKPHOS 73 02/27/2018 1357   AST 51 (H) 02/27/2018 1357   ALT 54 (H) 02/27/2018 1357   BILITOT 0.9 02/27/2018 1357       RADIOGRAPHIC STUDIES: I have personally reviewed the radiological images as listed and agreed with the findings in the report. No results found.   ASSESSMENT & PLAN:  Carcinoma of overlapping sites of left breast in female, estrogen receptor positive ([place]) # LEFT Breast Stage I s/p mastec [prophy right mastec] on Low risk mammoprint- on Letrozle. NED [until summer 2021].  Stable; NED.  # Ulcerative colitis- Stable.  # Arthritis- sec to AI.  Stable.  # mom- no cancer; mat great aunts/breast cancer; dad- all sons; no  siblings; pt not called back.   # BMD- Sep 2018- osteopenia. Con ca+vit D. STABLE  # mild elevated LFts- ? Lipitor/faty liver- exercise.  Defer to PCP.  # DISPOSITION: # follow up in 6 monthsMD/labs-cbc/cmpDr.B   Dr. Levonne Spiller- Pscychiatrist; [place].  Orders Placed This Encounter  Procedures  . CBC with Differential    Standing Status:   Future    Standing Expiration Date:   02/28/2019  . Comprehensive metabolic panel    Standing Status:   Future    Standing Expiration Date:   02/28/2019   All questions were answered. The patient knows to call the clinic with any problems, questions or concerns.      Cammie Sickle, MD 03/02/2018 12:57 PM

## 2018-02-27 NOTE — Assessment & Plan Note (Addendum)
#   LEFT Breast Stage I s/p mastec [prophy right mastec] on Low risk mammoprint- on Letrozle. NED [until summer 2021].  Stable; NED.  # Ulcerative colitis- Stable.  # Arthritis- sec to AI.  Stable.  # mom- no cancer; mat great aunts/breast cancer; dad- all sons; no siblings; pt not called back.   # BMD- Sep 2018- osteopenia. Con ca+vit D. STABLE  # mild elevated LFts- ? Lipitor/faty liver- exercise.  Defer to PCP.  # DISPOSITION: # follow up in 6 monthsMD/labs-cbc/cmpDr.B   Dr. Levonne Spiller- Pscychiatrist; [place].

## 2018-03-10 ENCOUNTER — Ambulatory Visit (INDEPENDENT_AMBULATORY_CARE_PROVIDER_SITE_OTHER): Payer: BLUE CROSS/BLUE SHIELD | Admitting: Psychology

## 2018-03-10 DIAGNOSIS — F3131 Bipolar disorder, current episode depressed, mild: Secondary | ICD-10-CM

## 2018-03-11 ENCOUNTER — Other Ambulatory Visit: Payer: Self-pay | Admitting: Internal Medicine

## 2018-03-16 DIAGNOSIS — L905 Scar conditions and fibrosis of skin: Secondary | ICD-10-CM | POA: Diagnosis not present

## 2018-03-16 DIAGNOSIS — D485 Neoplasm of uncertain behavior of skin: Secondary | ICD-10-CM | POA: Diagnosis not present

## 2018-03-18 ENCOUNTER — Encounter: Payer: Self-pay | Admitting: Family Medicine

## 2018-03-18 ENCOUNTER — Ambulatory Visit (INDEPENDENT_AMBULATORY_CARE_PROVIDER_SITE_OTHER): Payer: BLUE CROSS/BLUE SHIELD | Admitting: Family Medicine

## 2018-03-18 VITALS — BP 130/72 | HR 72 | Temp 97.6°F | Ht 66.0 in | Wt 213.6 lb

## 2018-03-18 DIAGNOSIS — M545 Low back pain, unspecified: Secondary | ICD-10-CM

## 2018-03-18 NOTE — Patient Instructions (Addendum)
You need better back support and back mechanics I think you have some irritable muscles due to inactivity and exercise interruption   Physical therapy should really help  Tell them about both places  Goal to get spasm under control / then increase flexibility and then strength   We want you to return to exercise when you can  Eventually abdominal and core strength is most important for back health   Heat is fine Robaxin is fine as needed (if it helps)  Tylenol also   Try not to twist and bend at the same time

## 2018-03-18 NOTE — Assessment & Plan Note (Signed)
R high lumbar back pain that started after bend/twist  No neuro symptoms  Palpable spasm noted Robaxin (she has) prn  Heat and stretching  She starts PT tomorrow for back pain from last month and will have them address this as well inst to avoid bend with twist and heavy lifting Enc walking  Eventually wt loss and core strength may help these recurrent back problems

## 2018-03-18 NOTE — Progress Notes (Signed)
Subjective:    Patient ID: Lindsay Fry, female    DOB: 12/02/1960, 58 y.o.   MRN: [id number]  HPI Here for back pain   Wt Readings from Last 3 Encounters:  03/18/18 213 lb 9 oz (96.9 kg)  02/27/18 214 lb (97.1 kg)  12/01/17 212 lb (96.2 kg)   34.47 kg/m   Still having some thoracic back pain - has not started her PT yet (she got sick and had to put it off_ Robaxin helps  New back pain started after bending over in a grocery store to pull something off a shelf Felt a small pop in R lower back  Then it became painful  Over past 2 days - she notices it in certain positions   Bending forward Especially bending and lifting or twisting  It "twinges"  No radiation to her legs  No numbness No foot drop or weakness   No bowel or bladder changes    Not when lying or sitting   Usually does water aerobics and walks  Has not been able to go lately   Patient Active Problem List   Diagnosis Date Noted  . Thoracic back pain 02/19/2018  . Uncontrolled type 2 diabetes mellitus with hyperglycemia, with long-term current use of insulin ([place]) 02/19/2018  . Pleural thickening 11/05/2017  . Abrasion of right knee 11/05/2017  . Fall 11/05/2017  . Mouth ulcers 11/05/2017  . Cervical radiculopathy due to degenerative joint disease of spine 10/28/2017  . 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 Supraventricular tachycardia ([place]) 04/22/2017  . Osteopenia 09/25/2016  . Estrogen deficiency 05/30/2016  . Need for hepatitis C screening test 03/19/2016  . Screening for HIV (human immunodeficiency virus) 03/19/2016  . Carcinoma of overlapping sites of left breast in female, estrogen receptor positive ([place]) 08/21/2015  . Chronic constipation 01/18/2015  . NSTEMI (non-ST elevated myocardial infarction) ([place]) 09/06/2014  . CAD (coronary artery disease) 09/06/2014  . DM (diabetes mellitus) ([place]) 09/05/2014  . 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 Carcinoma of left breast ([place])  06/02/2014  . Sinus tachycardia 04/27/2014  . Class 2 severe obesity due to excess calories with serious comorbidity and body mass index (BMI) of 36.0 to 36.9 in adult ([place]) 05/20/2013  . Bladder pain 02/17/2013  . ADD (attention deficit disorder) 11/19/2012  . Urinary frequency 08/05/2012  . Low back pain 08/05/2012  . 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 Chronic sinusitis 01/24/2012  . Vertigo, benign positional 01/24/2012  . Other screening mammogram 12/05/2010  . Routine general medical examination at a health care facility 10/14/2010  . FATIGUE 08/09/2009  . POSTMENOPAUSAL STATUS 06/22/2008  . Vitamin D deficiency 03/18/2008  . BENIGN NEOPLASM [place] SITE DIGESTIVE SYSTEM 01/09/2007  . HIATAL HERNIA 01/09/2007  . COLONIC POLYPS, ADENOMATOUS, HX OF 01/09/2007  . HYPERCHOLESTEROLEMIA 01/07/2007  . Bipolar disorder, unspecified ([place]) 01/07/2007  . Bipolar I disorder, most recent episode depressed ([place]) 01/07/2007  . Essential hypertension 01/07/2007  . ALLERGIC RHINITIS 01/07/2007  . ASTHMA 01/07/2007  . GERD 01/07/2007  . Ulcerative colitis ([place]) 01/07/2007  . ACNE ROSACEA 01/07/2007  . MIGRAINES, HX OF 01/07/2007  . Fibromyalgia 10/30/2006  . INSOMNIA 10/30/2006   Past Medical History:  Diagnosis Date  . ADD (attention deficit disorder) 11/19/2012  . Allergic rhinitis, cause unspecified   . Anxiety state, unspecified   . Arthritis   . Asymptomatic postmenopausal status (age-related) (natural)   . Atypical hyperplasia of left breast 2000  . Benign neoplasm of other and unspecified  site of the digestive system   . Bipolar disorder ([place])   . Breast cancer ([place])    Left Breast 34m invasive CA left uiq, dcis left uoq and a lot ADH, ALH in both breasts.  .Marland KitchenCAD (coronary artery disease)    a. 08/2014 NSTEMI/Cath: LM nl, LAD 50p, D1/D2 min irregs, LCX nl, OM1 40, LPDA nl, RI 99ost (2.058mvessel->med Rx), RCA nl, AM 60, nl EF; b. 11/2016 MV: EF 74%, no ischemia  (performed 2/2 dyspnea & new inf TWI).  . Marland Kitchenarcinoma of left breast (HCGal[place]5/19/2016  . Chronic diastolic CHF (congestive heart failure) ([facility]   a. 08/2014 Echo: EF 60-65%, Gr 1 DD, nl LA.  . Marland Kitchenlotting disorder (H[facility]port [facility]   on blood thiner, Plavix  . Cyst    on Achilles tendon  . Depression   . Diabetes mellitus ([facility]   frank  . Dysuria   . Edema   . Family history of malignant neoplasm of gastrointestinal tract   . Fibromyalgia   . GERD (gastroesophageal reflux disease)   . Glaucoma    pre glaucoma  . Headache    migraines  . Hiatal hernia   . History of alcoholism ([facility]  . History of migraines   . History of ovarian cyst   . Insomnia, unspecified   . Myocardial infarction (HCStiles8-21-16  . OCD (obsessive compulsive disorder)   . Osteopenia 09/25/2016   Femoral neck T -1.1  9/18  . Other screening mammogram   . Pure hypercholesterolemia   . Rosacea   . Subacute confusional state 11/19/2012  . Tubular adenoma of colon 01/10/11  . Ulcerative colitis, unspecified   . Unspecified asthma(493.90)   . Unspecified essential hypertension   . Unspecified hypothyroidism   . Unspecified vitamin D deficiency   . Vertigo    Past Surgical History:  Procedure Laterality Date  . APPENDECTOMY    . AXILLARY SENTINEL NODE BIOPSY Left 05/23/2014   Procedure: AXILLARY SENTINEL NODE BIOPSY;  Surgeon: SeChristene LyeMD;  Location: [facility]S;  Service: General;  Laterality: Left;  . BREAST BIOPSY  Aug 2000   on tamoxifen, atypical hyperplasia  . BREAST SURGERY Left Aug 2000   lumpectomy/ Dr CrSharlet Salina. BREAST SURGERY Bilateral 05/23/14   Mastectomy  . BUNIONECTOMY Right   . CARDIAC CATHETERIZATION N/A 09/05/2014   Procedure: Left Heart Cath and Coronary Angiography;  Surgeon: MuWellington HampshireMD;  [facility];  Service: Cardiovascular;  Laterality: N/A;  . CARDIAC CATHETERIZATION  09-05-14  . CATARACT EXTRACTION W/ INTRAOCULAR LENS IMPLANT    . CATARACT EXTRACTION W/  INTRAOCULAR LENS IMPLANT Bilateral   . CATARACT EXTRACTION, BILATERAL    . CESAREAN SECTION  1996/1997   placenta previa, gest DM, pre-eclampsia  . cholecystect  1999  . CHOLECYSTECTOMY  1997   adhesions also  . COLONOSCOPY  01/2001   Ulcerative colitis  . COLONOSCOPY  04/2004   UC, polyp  . COLONOSCOPY  12/08   UC, no polyps  . DEXA  04/1999 and 2010   normal  . ESOPHAGOGASTRODUODENOSCOPY  09/2001   polyp  . exercise stress test  11/2003   negative  . FEMUR FRACTURE SURGERY Left   . MASTECTOMY Bilateral 05-23-14   Dr. SaJamal Collin. MECKEL DIVERTICULUM EXCISION  1999  . NASAL SINUS SURGERY  05/1997  . nuclear stress test  06/2007   negative  . precancerous mole removed    . SIMPLE MASTECTOMY WITH  AXILLARY SENTINEL NODE BIOPSY Bilateral 05/23/2014   Procedure: Bilateral simple mastectomy, left sentinel node biopsy ;  Surgeon: Christene Lye, MD;  Location: [facility] ORS;  Service: General;  Laterality: Bilateral;  . Sleep study  11/09   no apnea, but did snore (done by [facility])  . TONSILLECTOMY  1984  . TUBAL LIGATION    . WISDOM TOOTH EXTRACTION  1980's   Social History   Tobacco Use  . Smoking status: Former Smoker    Packs/day: 0.25    Years: 5.00    Pack years: 1.25    Types: Cigarettes    Last attempt to quit: 01/14/1993    Years since quitting: 25.1  . Smokeless tobacco: Former Network engineer Use Topics  . Alcohol use: No    Alcohol/week: 0.0 standard drinks    Comment: Recovered ETOH  . Drug use: No   Family History  Problem Relation Age of Onset  . Coronary artery disease Father   . Colon cancer Father   . Alzheimer's disease Father   . Dementia Father   . Coronary artery disease Mother   . Hypertension Mother   . Osteoporosis Mother   . Colitis Mother   . Crohn's disease Mother   . Breast cancer Unknown        great aunts  . Coronary artery disease Unknown        Uncle (also AAA)  . Diabetes Unknown        remote family history  . Stroke Cousin   .  Esophageal cancer Neg Hx   . Rectal cancer Neg Hx   . Stomach cancer Neg Hx    Allergies  Allergen Reactions  . Ephedrine Other (See Comments)    Pt becomes hyper  . Aspartame And Phenylalanine Nausea Only  . Aspirin     REACTION: aggrivates colitis  . Crestor [Rosuvastatin]     REACTION: increased lfts  . Erythromycin     REACTION: GI upset  . Nsaids     REACTION: aggrivate colitis  . Other Other (See Comments)    Unable to take due to history of ulcerative colitis  . Oxycodone Other (See Comments)    Panic Attack   Current Outpatient Medications on File Prior to Visit  Medication Sig Dispense Refill  . amantadine (SYMMETREL) 100 MG capsule Take 100 mg by mouth 2 (two) times daily.    Marland Kitchen atorvastatin (LIPITOR) 80 MG tablet TAKE 1 TABLET(80 MG) BY MOUTH DAILY 90 tablet 3  . b complex vitamins capsule Take 1 capsule by mouth daily.    . B-D UF III MINI PEN NEEDLES 31G X 5 MM MISC USE FOR INJECTIONS TWICE DAILY 100 each 0  . BAYER MICROLET LANCETS lancets USE AS DIRECTED TO CHECK BLOOD SUGAR TWICE DAILY 200 each 2  . Blood Glucose Monitoring Suppl (CONTOUR NEXT EZ MONITOR) w/Device KIT USE AS DIRECTED TO CHECK BLOOD SUGAR TWICE DAILY 1 kit 2  . Calcium Carb-Cholecalciferol (CALCIUM PLUS VITAMIN D3) 600-800 MG-UNIT TABS Take 1 tablet by mouth daily.    . canagliflozin (INVOKANA) 300 MG TABS tablet TAKE 1 TABLET(300 MG) BY MOUTH DAILY BEFORE BREAKFAST 30 tablet 3  . chlorproMAZINE (THORAZINE) 25 MG tablet Take 25-50 mg by mouth as needed (migraines).     Marland Kitchen co-enzyme Q-10 50 MG capsule Take 50 mg by mouth daily.    . diazepam (VALIUM) 10 MG tablet Take 2.5-10 mg by mouth as needed for anxiety.    Marland Kitchen diltiazem (CARDIZEM  CD) 240 MG 24 hr capsule TAKE 1 CAPSULE(240 MG) BY MOUTH DAILY 90 capsule 3  . diphenoxylate-atropine (LOMOTIL) 2.5-0.025 MG tablet Take 1-2 tabs daily as needed for diarrhea 50 tablet 0  . famotidine (PEPCID) 40 MG tablet Take 40 mg by mouth 2 (two) times daily.    Marland Kitchen  FIBER SELECT GUMMIES PO Take 3 capsules by mouth daily.    Marland Kitchen gabapentin (NEURONTIN) 300 MG capsule TAKE 1 CAPSULE(300 MG) BY MOUTH THREE TIMES DAILY 90 capsule 11  . glucose blood (BAYER CONTOUR NEXT TEST) test strip USE AS DIRECTED TO CHECK BLOOD SUGAR TWICE DAILY 200 each 2  . Insulin Detemir (LEVEMIR FLEXTOUCH) 100 UNIT/ML Pen Inject 50 Units into the skin at bedtime. INJECT 50 UNITS UNDER THE SKIN DAILY AT 10 PM 5 pen 3  . Insulin Pen Needle (B-D UF III MINI PEN NEEDLES) 31G X 5 MM MISC USE FOR INJECTIONS TWICE DAILY 100 each 0  . lamoTRIgine (LAMICTAL) 200 MG tablet Take 400 mg by mouth daily.    Marland Kitchen latanoprost (XALATAN) 0.005 % ophthalmic solution Place 1 drop into both eyes At bedtime.    Marland Kitchen letrozole (FEMARA) 2.5 MG tablet TAKE 1 TABLET(2.5 MG) BY MOUTH DAILY 30 tablet 12  . LINZESS 290 MCG CAPS capsule TAKE 1 CAPSULE(290 MCG) BY MOUTH DAILY BEFORE BREAKFAST 30 capsule 6  . lithium carbonate (ESKALITH) 450 MG CR tablet Take 900 mg by mouth at bedtime.    . Melatonin 10 MG CAPS Take 1 capsule by mouth at bedtime.    . mesalamine (LIALDA) 1.2 g EC tablet TAKE 2 TABLETS BY MOUTH EVERY DAY 60 tablet 2  . methocarbamol (ROBAXIN) 500 MG tablet Take 1 tablet (500 mg total) by mouth 3 (three) times daily as needed for muscle spasms (back pain). Caution of sedation 30 tablet 1  . metoprolol succinate (TOPROL-XL) 50 MG 24 hr tablet Take 50 mg by mouth daily.   3  . Multiple Vitamin (MULTIVITAMIN) tablet Take 1 tablet by mouth daily.      . nitrofurantoin (MACRODANTIN) 100 MG capsule Take 100 mg by mouth as directed. Reported on 03/02/2015    . Omega-3 Krill Oil 500 MG CAPS Take 500 mg by mouth.    . pantoprazole (PROTONIX) 40 MG tablet TAKE 1 TABLET(40 MG) BY MOUTH TWICE DAILY 60 tablet 3  . Probiotic Product (PROBIOTIC DAILY PO) Take by mouth.    . QUEtiapine (SEROQUEL XR) 400 MG 24 hr tablet Take 800 mg by mouth at bedtime.    . Semaglutide (OZEMPIC) 1 MG/DOSE SOPN Inject 1 mg into the skin once a  week. 4 pen 3  . Simethicone (GAS-X PO) Take 1 tablet by mouth 4 (four) times daily.    . solifenacin (VESICARE) 5 MG tablet Take 1 tablet by mouth daily.    Marland Kitchen SYNTHROID 200 MCG tablet TAKE 1 TABLET(200 MCG) BY MOUTH DAILY BEFORE BREAKFAST 30 tablet 0  . triamcinolone (KENALOG) 0.1 % paste Use as directed 1 application in the mouth or throat 2 (two) times daily. To affected areas 5 g 1  . valACYclovir (VALTREX) 1000 MG tablet Take 1 tablet (1,000 mg total) by mouth daily. 30 tablet 5  . vitamin C (ASCORBIC ACID) 500 MG tablet Take 250 mg by mouth daily. Takes 2 a day    . VYVANSE 60 MG capsule Take 60 mg by mouth every morning.   0  . zaleplon (SONATA) 10 MG capsule Take 20 mg by mouth at bedtime as needed  for sleep.    . clopidogrel (PLAVIX) 75 MG tablet TAKE 1 TABLET(75 MG) BY MOUTH DAILY (Patient not taking: Reported on 03/18/2018) 90 tablet 0   No current facility-administered medications on file prior to visit.      Review of Systems  Constitutional: Positive for fatigue. Negative for activity change, appetite change, fever and unexpected weight change.  HENT: Negative for congestion, ear pain, rhinorrhea, sinus pressure and sore throat.   Eyes: Negative for pain, redness and visual disturbance.  Respiratory: Negative for cough, shortness of breath and wheezing.   Cardiovascular: Negative for chest pain and palpitations.  Gastrointestinal: Negative for abdominal pain, blood in stool, constipation and diarrhea.  Endocrine: Negative for polydipsia and polyuria.  Genitourinary: Negative for dysuria, frequency and urgency.  Musculoskeletal: Positive for arthralgias and back pain. Negative for myalgias.  Skin: Negative for pallor and rash.  Allergic/Immunologic: Negative for environmental allergies.  Neurological: Negative for dizziness, syncope and headaches.  Hematological: Negative for adenopathy. Does not bruise/bleed easily.  Psychiatric/Behavioral: Positive for dysphoric mood.  Negative for decreased concentration. The patient is nervous/anxious.        Treated for bipolar dz       Objective:   Physical Exam Constitutional:      General: She is not in acute distress.    Appearance: Normal appearance. She is well-developed. She is obese. She is not ill-appearing.  HENT:     Head: Normocephalic and atraumatic.  Eyes:     General: No scleral icterus.    Conjunctiva/sclera: Conjunctivae normal.     Pupils: Pupils are equal, round, and reactive to light.  Neck:     Musculoskeletal: Normal range of motion and neck supple.  Cardiovascular:     Rate and Rhythm: Normal rate and regular rhythm.  Pulmonary:     Effort: Pulmonary effort is normal.     Breath sounds: Normal breath sounds. No wheezing or rales.  Abdominal:     General: Bowel sounds are normal. There is no distension.     Palpations: Abdomen is soft.     Tenderness: There is no abdominal tenderness.  Musculoskeletal:        General: Tenderness present.     Right shoulder: She exhibits decreased range of motion, tenderness and spasm. She exhibits no bony tenderness, no swelling, no crepitus, no deformity, normal pulse and normal strength.     Lumbar back: She exhibits decreased range of motion, tenderness and spasm. She exhibits no bony tenderness and no edema.     Comments: High lumbar muscular tenderness on the R  Spasm palpable  No piriformis tenderness SLR on R causes back pain only Nl rom of hips  Nl gait  Flex 90 deg Ext 20 deg  L lat bend and twist are painful   No neuro changes   Lymphadenopathy:     Cervical: No cervical adenopathy.  Skin:    General: Skin is warm and dry.     Coloration: Skin is not pale.     Findings: No erythema or rash.  Neurological:     Mental Status: She is alert.     Cranial Nerves: No cranial nerve deficit.     Sensory: No sensory deficit.     Motor: No atrophy or abnormal muscle tone.     Coordination: Coordination normal.     Deep Tendon Reflexes:  Reflexes are normal and symmetric.  Psychiatric:        Mood and Affect: Mood normal.  Assessment & Plan:   Problem List Items Addressed This Visit      Other   Low back pain - Primary    R high lumbar back pain that started after bend/twist  No neuro symptoms  Palpable spasm noted Robaxin (she has) prn  Heat and stretching  She starts PT tomorrow for back pain from last month and will have them address this as well inst to avoid bend with twist and heavy lifting Enc walking  Eventually wt loss and core strength may help these recurrent back problems

## 2018-03-19 DIAGNOSIS — M546 Pain in thoracic spine: Secondary | ICD-10-CM | POA: Diagnosis not present

## 2018-03-23 DIAGNOSIS — D485 Neoplasm of uncertain behavior of skin: Secondary | ICD-10-CM | POA: Diagnosis not present

## 2018-03-24 ENCOUNTER — Encounter: Payer: Self-pay | Admitting: Family Medicine

## 2018-03-25 DIAGNOSIS — M546 Pain in thoracic spine: Secondary | ICD-10-CM | POA: Diagnosis not present

## 2018-03-27 ENCOUNTER — Telehealth: Payer: Self-pay | Admitting: Endocrinology

## 2018-03-27 DIAGNOSIS — M546 Pain in thoracic spine: Secondary | ICD-10-CM | POA: Diagnosis not present

## 2018-03-27 NOTE — Telephone Encounter (Signed)
Responded to patient's MyChart message sent regarding this issue. There are active orders in the patients chart.

## 2018-03-27 NOTE — Telephone Encounter (Signed)
Patient has Labs done in the [place] area and stated that there were no new orders in for her to have these done,  Patient's Appt is 04/02/2018 @ 1:00pm    She would like a call once the orders have been put in so she is able to get these done before her visit with Dr Dwyane Dee.

## 2018-03-31 ENCOUNTER — Other Ambulatory Visit: Payer: Self-pay

## 2018-03-31 ENCOUNTER — Other Ambulatory Visit (INDEPENDENT_AMBULATORY_CARE_PROVIDER_SITE_OTHER): Payer: BLUE CROSS/BLUE SHIELD

## 2018-03-31 DIAGNOSIS — E890 Postprocedural hypothyroidism: Secondary | ICD-10-CM | POA: Diagnosis not present

## 2018-03-31 DIAGNOSIS — E1165 Type 2 diabetes mellitus with hyperglycemia: Secondary | ICD-10-CM

## 2018-03-31 DIAGNOSIS — Z794 Long term (current) use of insulin: Secondary | ICD-10-CM | POA: Diagnosis not present

## 2018-03-31 DIAGNOSIS — M546 Pain in thoracic spine: Secondary | ICD-10-CM | POA: Diagnosis not present

## 2018-03-31 LAB — LIPID PANEL
Cholesterol: 180 mg/dL (ref 0–200)
HDL: 47.8 mg/dL (ref >39.00)
LDL Cholesterol: 98 mg/dL (ref 0–99)
NonHDL: 131.75
Total CHOL/HDL Ratio: 4
Triglycerides: 168 mg/dL — ABNORMAL HIGH (ref 0.0–149.0)
VLDL: 33.6 mg/dL (ref 0.0–40.0)

## 2018-03-31 LAB — COMPREHENSIVE METABOLIC PANEL
ALT: 57 U/L — ABNORMAL HIGH (ref 0–35)
AST: 42 U/L — ABNORMAL HIGH (ref 0–37)
Albumin: 4.6 g/dL (ref 3.5–5.2)
Alkaline Phosphatase: 97 U/L (ref 39–117)
BUN: 11 mg/dL (ref 6–23)
CO2: 29 mEq/L (ref 19–32)
Calcium: 10 mg/dL (ref 8.4–10.5)
Chloride: 106 mEq/L (ref 96–112)
Creatinine, Ser: 0.86 mg/dL (ref 0.40–1.20)
GFR: 67.38 mL/min (ref >60.00)
Glucose, Bld: 128 mg/dL — ABNORMAL HIGH (ref 70–99)
Potassium: 4.3 mEq/L (ref 3.5–5.1)
Sodium: 143 mEq/L (ref 135–145)
Total Bilirubin: 0.6 mg/dL (ref 0.2–1.2)
Total Protein: 6.8 g/dL (ref 6.0–8.3)

## 2018-03-31 LAB — TSH: TSH: 1.66 u[IU]/mL (ref 0.35–4.50)

## 2018-03-31 LAB — T4, FREE: Free T4: 0.69 ng/dL (ref 0.60–1.60)

## 2018-03-31 LAB — MICROALBUMIN / CREATININE URINE RATIO
Creatinine,U: 78.3 mg/dL
Microalb Creat Ratio: 2.9 mg/g (ref 0.0–30.0)
Microalb, Ur: 2.2 mg/dL — ABNORMAL HIGH (ref 0.0–1.9)

## 2018-03-31 LAB — HEMOGLOBIN A1C: Hgb A1c MFr Bld: 6.2 % (ref 4.6–6.5)

## 2018-04-02 ENCOUNTER — Encounter: Payer: Self-pay | Admitting: Endocrinology

## 2018-04-02 ENCOUNTER — Other Ambulatory Visit: Payer: Self-pay

## 2018-04-02 ENCOUNTER — Ambulatory Visit (INDEPENDENT_AMBULATORY_CARE_PROVIDER_SITE_OTHER): Payer: BLUE CROSS/BLUE SHIELD | Admitting: Endocrinology

## 2018-04-02 VITALS — BP 120/74 | HR 84 | Temp 98.3°F | Ht 66.0 in | Wt 213.0 lb

## 2018-04-02 DIAGNOSIS — E1165 Type 2 diabetes mellitus with hyperglycemia: Secondary | ICD-10-CM

## 2018-04-02 DIAGNOSIS — Z794 Long term (current) use of insulin: Secondary | ICD-10-CM

## 2018-04-02 DIAGNOSIS — E890 Postprocedural hypothyroidism: Secondary | ICD-10-CM

## 2018-04-02 DIAGNOSIS — E559 Vitamin D deficiency, unspecified: Secondary | ICD-10-CM

## 2018-04-02 DIAGNOSIS — R945 Abnormal results of liver function studies: Secondary | ICD-10-CM

## 2018-04-02 DIAGNOSIS — E7800 Pure hypercholesterolemia, unspecified: Secondary | ICD-10-CM | POA: Diagnosis not present

## 2018-04-02 NOTE — Patient Instructions (Addendum)
Cut down soft drinks and animal fats. Walk daily

## 2018-04-02 NOTE — Progress Notes (Signed)
Patient ID: Lindsay Fry, female   DOB: February 23, 1960, 58 y.o.   MRN: [id number]   Reason for Appointment:  Hypothyroidism and diabetes, followup visit  History of Present Illness:  DIABETES:  Prior history: With her high A1c of 6.7 in 03/2014 she had an abnormal glucose tolerance test indicating diabetes and 1 hour glucose of 300 She was started on metformin; however even with 1500 mg of metformin ER  blood sugars were not controlled especially fasting Subsequently with taking Janumet XR 100/1000 daily her blood sugars were much better Because of her weight gain and A1c going up to 7.3 along with occasional readings over 200 she was started on Victoza in 11/16 She has had progressive hyperglycemia previously and A1c was up to 8.6% in 4/17  She was started on Levemir insulin in 08/2015 because of progressive rise in blood sugars and inability to control with 3 other agents   Non-insulin hypoglycemic drugs: Ozempic 1 mg weekly; metformin ER 2000 mg daily, Invokana 300 mg daily  INSULIN regimen: Levemir 50 units at bedtime  A1c is 6.2 compared to 6.9  Current management, blood sugar patterns and problems identified:  She has had better fasting readings more recently  She will have occasionally higher blood sugars midday rarely and in the evening  However this is likely to be from her drinking regular soft drinks since she does not tolerate diet drinks  Since her last visit she has cut back on sweets and chocolates and she does not think she has much cravings for these  Because of her difficulty getting to the swimming pool she has not done her exercises and has not done any walking also  Surprisingly her weight is about the same  Has not changed her insulin for some time  No hypoglycemia also  Exercise regimen: not doing Water aerobics and recently not walking  GLUCOSE readings from review of the CONTOUR monitor download and averages for 30 days:   PRE-MEAL  Fasting Lunch Dinner Bedtime Overall  Glucose range: 101-136  113     Mean/median: 120    132   POST-MEAL PC Breakfast PC Lunch PC Dinner  Glucose range:  200   180, 162  Mean/median:    171   PREVIOUS readings:   PRE-MEAL Fasting Lunch Dinner Bedtime Overall  Glucose range:  111-144      Mean/median: 125    146   POST-MEAL PC Breakfast PC Lunch PC Dinner  Glucose range:    134-236  Mean/median:       Weight history:  Wt Readings from Last 3 Encounters:  04/02/18 213 lb (96.6 kg)  03/18/18 213 lb 9 oz (96.9 kg)  02/27/18 214 lb (97.1 kg)   Previous consultation with dietitian: none   LABS:  Lab Results  Component Value Date   HGBA1C 6.2 03/31/2018   HGBA1C 6.9 (H) 11/24/2017   HGBA1C 6.6 (H) 07/24/2017   Lab Results  Component Value Date   MICROALBUR 2.2 (H) 03/31/2018   [place]bell 98 03/31/2018   CREATININE 0.86 03/31/2018   HYPOTHYROIDISM: This was first diagnosed in 1992 after her treatment for Graves' disease with I-131 She has been on relatively large doses of thyroxine supplements She was on 224 mcg since 11/13 and her TSH was normal in 3/14 Subjectively difficult to assess her thyroid since she tends to have fatigue chronically.  In 2014 her dose was reduced to 200 mcg daily and her dose has been fluctuating since then She had  required periodic increase in dosage including in 05/2014 when her TSH was 20  She is  on 200 g daily, on the brand name Synthroid  Her symptoms are usually nonspecific for hypothyroidism, mostly related to other medical problems including sleep disturbances and bipolar illness. She is generally feeling fairly good  She has been quite regular with the Synthroid every day before breakfast         TSH is consistently normal y  Lab Results  Component Value Date   TSH 1.66 03/31/2018   TSH 0.71 11/24/2017   TSH 2.29 07/24/2017   FREET4 0.69 03/31/2018   FREET4 0.77 07/24/2017   FREET4 1.15 02/06/2017     Lab on 03/31/2018   Component Date Value Ref Range Status  . Free T4 03/31/2018 0.69  0.60 - 1.60 ng/dL Final   Comment: Specimens from patients who are undergoing biotin therapy and /or ingesting biotin supplements may contain high levels of biotin.  The higher biotin concentration in these specimens interferes with this Free T4 assay.  Specimens that contain high levels  of biotin may cause false high results for this Free T4 assay.  Please interpret results in light of the total clinical presentation of the patient.    Marland Kitchen TSH 03/31/2018 1.66  0.35 - 4.50 uIU/mL Final  . Cholesterol 03/31/2018 180  0 - 200 mg/dL Final   ATP III Classification       Desirable:  < 200 mg/dL               Borderline High:  200 - 239 mg/dL          High:  > = 240 mg/dL  . Triglycerides 03/31/2018 168.0* 0.0 - 149.0 mg/dL Final   Normal:  <150 mg/dLBorderline High:  150 - 199 mg/dL  . HDL 03/31/2018 47.80  >39.00 mg/dL Final  . VLDL 03/31/2018 33.6  0.0 - 40.0 mg/dL Final  . LDL Cholesterol 03/31/2018 98  0 - 99 mg/dL Final  . Total CHOL/HDL Ratio 03/31/2018 4   Final                  Men          Women1/2 Average Risk     3.4          3.3Average Risk          5.0          4.42X Average Risk          9.6          7.13X Average Risk          15.0          11.0                      . NonHDL 03/31/2018 131.75   Final   NOTE:  Non-HDL goal should be 30 mg/dL higher than patient's LDL goal (i.e. LDL goal of < 70 mg/dL, would have non-HDL goal of < 100 mg/dL)  . Microalb, Ur 03/31/2018 2.2* 0.0 - 1.9 mg/dL Final  . Creatinine,U 03/31/2018 78.3  mg/dL Final  . Microalb Creat Ratio 03/31/2018 2.9  0.0 - 30.0 mg/g Final  . Sodium 03/31/2018 143  135 - 145 mEq/L Final  . Potassium 03/31/2018 4.3  3.5 - 5.1 mEq/L Final  . Chloride 03/31/2018 106  96 - 112 mEq/L Final  . CO2 03/31/2018 29  19 - 32 mEq/L Final  . Glucose, Bld 03/31/2018  128* 70 - 99 mg/dL Final  . BUN 03/31/2018 11  6 - 23 mg/dL Final  . Creatinine, Ser 03/31/2018 0.86  0.40  - 1.20 mg/dL Final  . Total Bilirubin 03/31/2018 0.6  0.2 - 1.2 mg/dL Final  . Alkaline Phosphatase 03/31/2018 97  39 - 117 U/L Final  . AST 03/31/2018 42* 0 - 37 U/L Final  . ALT 03/31/2018 57* 0 - 35 U/L Final  . Total Protein 03/31/2018 6.8  6.0 - 8.3 g/dL Final  . Albumin 03/31/2018 4.6  3.5 - 5.2 g/dL Final  . Calcium 03/31/2018 10.0  8.4 - 10.5 mg/dL Final  . GFR 03/31/2018 67.38  >60.00 mL/min Final  . Hgb A1c MFr Bld 03/31/2018 6.2  4.6 - 6.5 % Final   Glycemic Control Guidelines for People with Diabetes:Non Diabetic:  <6%Goal of Therapy: <7%Additional Action Suggested:  >8%     Allergies as of 04/02/2018      Reactions   Ephedrine Other (See Comments)   Pt becomes hyper   Aspartame And Phenylalanine Nausea Only   Aspirin    REACTION: aggrivates colitis   Crestor [rosuvastatin]    REACTION: increased lfts   Erythromycin    REACTION: GI upset   Nsaids    REACTION: aggrivate colitis   Other Other (See Comments)   Unable to take due to history of ulcerative colitis   Oxycodone Other (See Comments)   Panic Attack      Medication List       Accurate as of April 02, 2018  1:15 PM. Always use your most recent med list.        amantadine 100 MG capsule Commonly known as:  SYMMETREL Take 100 mg by mouth 2 (two) times daily.   atorvastatin 80 MG tablet Commonly known as:  LIPITOR TAKE 1 TABLET(80 MG) BY MOUTH DAILY   b complex vitamins capsule Take 1 capsule by mouth daily.   Bayer Microlet Lancets lancets USE AS DIRECTED TO CHECK BLOOD SUGAR TWICE DAILY   Calcium Plus Vitamin D3 600-800 MG-UNIT Tabs Generic drug:  Calcium Carb-Cholecalciferol Take 1 tablet by mouth daily.   canagliflozin 300 MG Tabs tablet Commonly known as:  Invokana TAKE 1 TABLET(300 MG) BY MOUTH DAILY BEFORE BREAKFAST   chlorproMAZINE 25 MG tablet Commonly known as:  THORAZINE Take 25-50 mg by mouth as needed (migraines).   clopidogrel 75 MG tablet Commonly known as:  PLAVIX TAKE 1  TABLET(75 MG) BY MOUTH DAILY   co-enzyme Q-10 50 MG capsule Take 50 mg by mouth daily.   CONTOUR NEXT EZ MONITOR w/Device Kit USE AS DIRECTED TO CHECK BLOOD SUGAR TWICE DAILY   diazepam 10 MG tablet Commonly known as:  VALIUM Take 2.5-10 mg by mouth as needed for anxiety.   diltiazem 240 MG 24 hr capsule Commonly known as:  CARDIZEM CD TAKE 1 CAPSULE(240 MG) BY MOUTH DAILY   diphenoxylate-atropine 2.5-0.025 MG tablet Commonly known as:  Lomotil Take 1-2 tabs daily as needed for diarrhea   famotidine 40 MG tablet Commonly known as:  PEPCID Take 40 mg by mouth 2 (two) times daily.   FIBER SELECT GUMMIES PO Take 3 capsules by mouth daily.   gabapentin 300 MG capsule Commonly known as:  NEURONTIN TAKE 1 CAPSULE(300 MG) BY MOUTH THREE TIMES DAILY   GAS-X PO Take 1 tablet by mouth 4 (four) times daily.   glucose blood test strip Commonly known as:  Visual merchandiser Next Test USE AS DIRECTED TO CHECK BLOOD SUGAR  TWICE DAILY   Insulin Detemir 100 UNIT/ML Pen Commonly known as:  Levemir FlexTouch Inject 50 Units into the skin at bedtime. INJECT 50 UNITS UNDER THE SKIN DAILY AT 10 PM   Insulin Pen Needle 31G X 5 MM Misc Commonly known as:  B-D UF III MINI PEN NEEDLES USE FOR INJECTIONS TWICE DAILY   B-D UF III MINI PEN NEEDLES 31G X 5 MM Misc Generic drug:  Insulin Pen Needle USE FOR INJECTIONS TWICE DAILY   lamoTRIgine 200 MG tablet Commonly known as:  LAMICTAL Take 400 mg by mouth daily.   latanoprost 0.005 % ophthalmic solution Commonly known as:  XALATAN Place 1 drop into both eyes At bedtime.   letrozole 2.5 MG tablet Commonly known as:  FEMARA TAKE 1 TABLET(2.5 MG) BY MOUTH DAILY   Linzess 290 MCG Caps capsule Generic drug:  linaclotide TAKE 1 CAPSULE(290 MCG) BY MOUTH DAILY BEFORE BREAKFAST   lithium carbonate 450 MG CR tablet Commonly known as:  ESKALITH Take 900 mg by mouth at bedtime.   Melatonin 10 MG Caps Take 1 capsule by mouth at bedtime.    mesalamine 1.2 g EC tablet Commonly known as:  LIALDA TAKE 2 TABLETS BY MOUTH EVERY DAY   methocarbamol 500 MG tablet Commonly known as:  ROBAXIN Take 1 tablet (500 mg total) by mouth 3 (three) times daily as needed for muscle spasms (back pain). Caution of sedation   metoprolol succinate 50 MG 24 hr tablet Commonly known as:  TOPROL-XL Take 50 mg by mouth daily.   multivitamin tablet Take 1 tablet by mouth daily.   nitrofurantoin 100 MG capsule Commonly known as:  MACRODANTIN Take 100 mg by mouth as directed. Reported on 03/02/2015   Omega-3 Krill Oil 500 MG Caps Take 500 mg by mouth.   pantoprazole 40 MG tablet Commonly known as:  PROTONIX TAKE 1 TABLET(40 MG) BY MOUTH TWICE DAILY   PROBIOTIC DAILY PO Take by mouth.   QUEtiapine 400 MG 24 hr tablet Commonly known as:  SEROQUEL XR Take 800 mg by mouth at bedtime.   Semaglutide (1 MG/DOSE) 2 MG/1.5ML Sopn Commonly known as:  Ozempic (1 MG/DOSE) Inject 1 mg into the skin once a week.   solifenacin 5 MG tablet Commonly known as:  VESICARE Take 1 tablet by mouth daily.   Synthroid 200 MCG tablet Generic drug:  levothyroxine TAKE 1 TABLET(200 MCG) BY MOUTH DAILY BEFORE BREAKFAST   triamcinolone 0.1 % paste Commonly known as:  KENALOG Use as directed 1 application in the mouth or throat 2 (two) times daily. To affected areas   valACYclovir 1000 MG tablet Commonly known as:  VALTREX Take 1 tablet (1,000 mg total) by mouth daily.   vitamin C 500 MG tablet Commonly known as:  ASCORBIC ACID Take 250 mg by mouth daily. Takes 2 a day   Vyvanse 60 MG capsule Generic drug:  lisdexamfetamine Take 60 mg by mouth every morning.   zaleplon 10 MG capsule Commonly known as:  SONATA Take 20 mg by mouth at bedtime as needed for sleep.       Allergies:  Allergies  Allergen Reactions  . Ephedrine Other (See Comments)    Pt becomes hyper  . Aspartame And Phenylalanine Nausea Only  . Aspirin     REACTION: aggrivates  colitis  . Crestor [Rosuvastatin]     REACTION: increased lfts  . Erythromycin     REACTION: GI upset  . Nsaids     REACTION: aggrivate colitis  .  Other Other (See Comments)    Unable to take due to history of ulcerative colitis  . Oxycodone Other (See Comments)    Panic Attack    Past Medical History:  Diagnosis Date  . ADD (attention deficit disorder) 11/19/2012  . Allergic rhinitis, cause unspecified   . Anxiety state, unspecified   . Arthritis   . Asymptomatic postmenopausal status (age-related) (natural)   . Atypical hyperplasia of left breast 2000  . Benign neoplasm of other and unspecified site of the digestive system   . Bipolar disorder ([place])   . Breast cancer ([place])    Left Breast 45m invasive CA left uiq, dcis left uoq and a lot ADH, ALH in both breasts.  .Marland KitchenCAD (coronary artery disease)    a. 08/2014 NSTEMI/Cath: LM nl, LAD 50p, D1/D2 min irregs, LCX nl, OM1 40, LPDA nl, RI 99ost (2.065mvessel->med Rx), RCA nl, AM 60, nl EF; b. 11/2016 MV: EF 74%, no ischemia (performed 2/2 dyspnea & new inf TWI).  . Marland Kitchenarcinoma of left breast (HCJohn Day5/19/2016  . Chronic diastolic CHF (congestive heart failure) (HCHavre   a. 08/2014 Echo: EF 60-65%, Gr 1 DD, nl LA.  . Marland Kitchenlotting disorder (HCRowena   on blood thiner, Plavix  . Cyst    on Achilles tendon  . Depression   . Diabetes mellitus (HCRalls   frank  . Dysuria   . Edema   . Family history of malignant neoplasm of gastrointestinal tract   . Fibromyalgia   . GERD (gastroesophageal reflux disease)   . Glaucoma    pre glaucoma  . Headache    migraines  . Hiatal hernia   . History of alcoholism (HCNew [place]  . History of migraines   . History of ovarian cyst   . Insomnia, unspecified   . Myocardial infarction ([facility]CRockledge8-21-16  . OCD (obsessive compulsive disorder)   . Osteopenia 09/25/2016   Femoral neck T -1.1  9/18  . Other screening mammogram   . Pure hypercholesterolemia   . Rosacea   . Subacute confusional state 11/19/2012  . Tubular  adenoma of colon 01/10/11  . Ulcerative colitis, unspecified   . Unspecified asthma(493.90)   . Unspecified essential hypertension   . Unspecified hypothyroidism   . Unspecified vitamin D deficiency   . Vertigo     Past Surgical History:  Procedure Laterality Date  . APPENDECTOMY    . AXILLARY SENTINEL NODE BIOPSY Left 05/23/2014   Procedure: AXILLARY SENTINEL NODE BIOPSY;  Surgeon: SeChristene LyeMD;  Location: [facility]S;  Service: General;  Laterality: Left;  . BREAST BIOPSY  Aug 2000   on tamoxifen, atypical hyperplasia  . BREAST SURGERY Left Aug 2000   lumpectomy/ Dr CrSharlet Salina. BREAST SURGERY Bilateral 05/23/14   Mastectomy  . BUNIONECTOMY Right   . CARDIAC CATHETERIZATION N/A 09/05/2014   Procedure: Left Heart Cath and Coronary Angiography;  Surgeon: MuWellington HampshireMD;  [facility];  Service: Cardiovascular;  Laterality: N/A;  . CARDIAC CATHETERIZATION  09-05-14  . CATARACT EXTRACTION W/ INTRAOCULAR LENS IMPLANT    . CATARACT EXTRACTION W/ INTRAOCULAR LENS IMPLANT Bilateral   . CATARACT EXTRACTION, BILATERAL    . CESAREAN SECTION  1996/1997   placenta previa, gest DM, pre-eclampsia  . cholecystect  1999  . CHOLECYSTECTOMY  1997   adhesions also  . COLONOSCOPY  01/2001   Ulcerative colitis  . COLONOSCOPY  04/2004   UC, polyp  . COLONOSCOPY  12/08  UC, no polyps  . DEXA  04/1999 and 2010   normal  . ESOPHAGOGASTRODUODENOSCOPY  09/2001   polyp  . exercise stress test  11/2003   negative  . FEMUR FRACTURE SURGERY Left   . MASTECTOMY Bilateral 05-23-14   Dr. Jamal Collin  . MECKEL DIVERTICULUM EXCISION  1999  . NASAL SINUS SURGERY  05/1997  . nuclear stress test  06/2007   negative  . precancerous mole removed    . SIMPLE MASTECTOMY WITH AXILLARY SENTINEL NODE BIOPSY Bilateral 05/23/2014   Procedure: Bilateral simple mastectomy, left sentinel node biopsy ;  Surgeon: Christene Lye, MD;  Location: [facility] ORS;  Service: General;  Laterality: Bilateral;   . Sleep study  11/09   no apnea, but did snore (done by [facility])  . TONSILLECTOMY  1984  . TUBAL LIGATION    . WISDOM TOOTH EXTRACTION  1980's    Family History  Problem Relation Age of Onset  . Coronary artery disease Father   . Colon cancer Father   . Alzheimer's disease Father   . Dementia Father   . Coronary artery disease Mother   . Hypertension Mother   . Osteoporosis Mother   . Colitis Mother   . Crohn's disease Mother   . Breast cancer Unknown        great aunts  . Coronary artery disease Unknown        Uncle (also AAA)  . Diabetes Unknown        remote family history  . Stroke Cousin   . Esophageal cancer Neg Hx   . Rectal cancer Neg Hx   . Stomach cancer Neg Hx     Social History:  reports that she quit smoking about 25 years ago. Her smoking use included cigarettes. She has a 1.25 pack-year smoking history. She has quit using smokeless tobacco. She reports that she does not drink alcohol or use drugs.  REVIEW Of SYSTEMS:   HYPERCALCEMIA: Her calcium has been previously high, upper normal subsequently PTH in the past has been borderline for hyperparathyroidism and is last below 20  She is taking OTC vitamin D3, 2000 units and has been on calcium by her oncologist, 500 mg  Bone density in 9/15 shows T score -1.1 at the hip  Lab Results  Component Value Date   PTH 18 10/25/2016   CALCIUM 10.0 03/31/2018   PHOS 2.3 01/29/2007     Lab Results  Component Value Date   CALCIUM 10.0 03/31/2018   PHOS 2.3 01/29/2007   Lab Results  Component Value Date   VD25OH 24.75 (L) 02/06/2017   VD25OH 44 05/12/2013   VD25OH 47 05/19/2012     HYPERTENSION: Blood pressure is controlled, managed by cardiologist.   on Bystolic twice a day and diltiazem and also on Invokana  Lab Results  Component Value Date   CREATININE 0.86 03/31/2018   BUN 11 03/31/2018   NA 143 03/31/2018   K 4.3 03/31/2018   CL 106 03/31/2018   CO2 29 03/31/2018    History of  hypercholesterolemia: Management by cardiologist and has been on 80 mg total since her MI LDL recently above 70 She thinks she is regular with her atorvastatin and not clear why her LDL is higher, may be eating more hamburgers and not always watching saturated fat  Lab Results  Component Value Date   CHOL 180 03/31/2018   HDL 47.80 03/31/2018   LDLCALC 98 03/31/2018   LDLDIRECT 67.0 11/10/2015   TRIG 168.0 (  H) 03/31/2018   CHOLHDL 4 03/31/2018   Possible fatty liver: Liver functions are slightly higher again   Lab Results  Component Value Date   ALT 57 (H) 03/31/2018      Examination:   BP 120/74 (BP Location: Right Arm, Patient Position: Sitting, Cuff Size: Large)   Pulse 84   Temp 98.3 F (36.8 C) (Oral)   Ht '5\' 6"'$  (1.676 m)   Wt 213 lb (96.6 kg)   LMP 08/23/2006 (Approximate) Comment: tubal ligation  SpO2 98%   BMI 34.38 kg/m       Assessments   DIABETES:  See history of present illness for evaluation of  current management, blood sugar patterns and problems identified  Her A1c is 6.2  Overall this is likely to be from generally better diet with cutting back on carbohydrates and sweets  However she has not done much exercise over the last month or so and has not lost weight She still has excellent control overall with a multidrug regimen including Levemir insulin, Invokana and Ozempic and fasting readings are generally good Discussed her blood sugar download results  Encouraged her to continue good diet and start walking for exercise since her depression is better now  To check more readings after meals regardless of whether she is having regular soft insulin not, hopefully she will be able to cut back on portions of days Discussed fasting blood sugar targets and if they are going below 100 may be able to cut back 4 units on her Levemir Otherwise she needs to watch her evening readings more and call if consistently high  Hypothyroidism, post ablative:  TSH  is consistently normal with 200 mcg levothyroxine and she will continue the same  LIPIDS: LDL is higher and may be partly related to diet Given her list of high saturated fat foods Consider Zetia if LDL continues to be high  Vitamin D deficiency: We will do follow-up on the next visit   There are no Patient Instructions on file for this visit.  Total visit time for evaluation and management of multiple problems and counseling =25 minutes  .  Elayne Snare 04/02/2018, 1:15 PM   Note: This office note was prepared with Dragon voice recognition system technology. Any transcriptional errors that result from this process are unintentional.

## 2018-04-03 ENCOUNTER — Telehealth: Payer: Self-pay

## 2018-04-03 DIAGNOSIS — M546 Pain in thoracic spine: Secondary | ICD-10-CM | POA: Diagnosis not present

## 2018-04-03 NOTE — Telephone Encounter (Signed)
Received a form from insurance company that needed to be done for prior authorization for Linzess through [employer].  I filled it out and faxed it back as requested.  I called [employer] and left a message requesting an update on this prior auth.

## 2018-04-07 ENCOUNTER — Ambulatory Visit (INDEPENDENT_AMBULATORY_CARE_PROVIDER_SITE_OTHER): Payer: BLUE CROSS/BLUE SHIELD | Admitting: Psychology

## 2018-04-07 DIAGNOSIS — F3131 Bipolar disorder, current episode depressed, mild: Secondary | ICD-10-CM | POA: Diagnosis not present

## 2018-04-08 NOTE — Telephone Encounter (Signed)
Please double check to make sure there are no contraindications given her current medications and health issues.  If not, okay to try.

## 2018-04-08 NOTE — Telephone Encounter (Signed)
Pt calling to follow up on PA for Linzess.

## 2018-04-08 NOTE — Telephone Encounter (Signed)
Unfortunately, I have to have your okay on this, sir. I am not at liberty to make that decision.

## 2018-04-08 NOTE — Telephone Encounter (Signed)
Yes, I was giving you my okay but check with pharmacy to make sure there are no contraindications (should be okay as the drugs are similar, I have never prescribed this particular drug though.  Thus, I wanted pharmacy input just to be sure.  Thanks)

## 2018-04-08 NOTE — Telephone Encounter (Signed)
Patient's insurance ([employer]) has denied Linzess for patient. Instead, they require that she first try and fail Trulance.   Dr Henrene Pastor, please advise.... Okay to send Trulance rx instead?

## 2018-04-09 MED ORDER — PLECANATIDE 3 MG PO TABS: 3.0000 mg | ORAL_TABLET | Freq: Every day | ORAL | 0 refills | Status: DC

## 2018-04-09 NOTE — Addendum Note (Signed)
Addended by: Larina Bras on: 04/09/2018 12:48 PM   Modules accepted: Orders

## 2018-04-09 NOTE — Telephone Encounter (Signed)
Left voicemail for patient to call back. 

## 2018-04-09 NOTE — Telephone Encounter (Signed)
I have spoken to patient to advise that we will try her on Trulance due to insurance request. She verbalizes understanding and will call us back if it doesn't work.

## 2018-04-11 ENCOUNTER — Other Ambulatory Visit: Payer: Self-pay | Admitting: Cardiovascular Disease

## 2018-04-13 MED ORDER — CLOPIDOGREL BISULFATE 75 MG PO TABS: ORAL_TABLET | ORAL | 2 refills | Status: DC

## 2018-04-17 ENCOUNTER — Other Ambulatory Visit: Payer: Self-pay | Admitting: Endocrinology

## 2018-04-21 MED ORDER — LINACLOTIDE 290 MCG PO CAPS: 290.0000 ug | ORAL_CAPSULE | Freq: Every day | ORAL | 2 refills | Status: DC

## 2018-04-22 ENCOUNTER — Telehealth: Payer: Self-pay

## 2018-04-22 NOTE — Telephone Encounter (Signed)
Prior Auth for Linzess 290 mcg has been submitted on covermymeds.

## 2018-04-24 DIAGNOSIS — F900 Attention-deficit hyperactivity disorder, predominantly inattentive type: Secondary | ICD-10-CM | POA: Diagnosis not present

## 2018-04-24 DIAGNOSIS — F3176 Bipolar disorder, in full remission, most recent episode depressed: Secondary | ICD-10-CM | POA: Diagnosis not present

## 2018-04-24 DIAGNOSIS — F3174 Bipolar disorder, in full remission, most recent episode manic: Secondary | ICD-10-CM | POA: Diagnosis not present

## 2018-04-26 ENCOUNTER — Other Ambulatory Visit: Payer: Self-pay | Admitting: Endocrinology

## 2018-04-27 NOTE — Telephone Encounter (Signed)
Approvedon April 8  Effective from 04/22/2018 through 04/21/2019.

## 2018-04-29 ENCOUNTER — Other Ambulatory Visit: Payer: Self-pay | Admitting: Family Medicine

## 2018-04-29 NOTE — Telephone Encounter (Signed)
Last OV was 03/18/18 for back pain. Last filled on 04/22/17 #90 caps with 11 additional refills

## 2018-04-30 ENCOUNTER — Other Ambulatory Visit: Payer: Self-pay | Admitting: Family Medicine

## 2018-05-04 ENCOUNTER — Encounter: Payer: Self-pay | Admitting: *Deleted

## 2018-05-05 ENCOUNTER — Telehealth: Payer: Self-pay | Admitting: *Deleted

## 2018-05-05 ENCOUNTER — Encounter: Payer: Self-pay | Admitting: Internal Medicine

## 2018-05-05 ENCOUNTER — Ambulatory Visit (INDEPENDENT_AMBULATORY_CARE_PROVIDER_SITE_OTHER): Payer: BLUE CROSS/BLUE SHIELD | Admitting: Internal Medicine

## 2018-05-05 ENCOUNTER — Other Ambulatory Visit: Payer: Self-pay | Admitting: Endocrinology

## 2018-05-05 ENCOUNTER — Other Ambulatory Visit: Payer: Self-pay

## 2018-05-05 VITALS — Ht 66.5 in | Wt 213.0 lb

## 2018-05-05 DIAGNOSIS — Z860100 Personal history of colon polyps, unspecified: Secondary | ICD-10-CM

## 2018-05-05 DIAGNOSIS — K51919 Ulcerative colitis, unspecified with unspecified complications: Secondary | ICD-10-CM | POA: Diagnosis not present

## 2018-05-05 DIAGNOSIS — Z7902 Long term (current) use of antithrombotics/antiplatelets: Secondary | ICD-10-CM

## 2018-05-05 DIAGNOSIS — R197 Diarrhea, unspecified: Secondary | ICD-10-CM

## 2018-05-05 MED ORDER — ONDANSETRON 4 MG PO TBDP: 4.0000 mg | ORAL_TABLET | Freq: Four times a day (QID) | ORAL | 1 refills | Status: DC | PRN

## 2018-05-05 MED ORDER — LINACLOTIDE 290 MCG PO CAPS: 290.0000 ug | ORAL_CAPSULE | Freq: Every day | ORAL | 11 refills | Status: DC

## 2018-05-05 MED ORDER — PANTOPRAZOLE SODIUM 40 MG PO TBEC: DELAYED_RELEASE_TABLET | ORAL | 11 refills | Status: DC

## 2018-05-05 MED ORDER — MESALAMINE 1.2 G PO TBEC: 2.4000 g | DELAYED_RELEASE_TABLET | Freq: Every day | ORAL | 11 refills | Status: DC

## 2018-05-05 NOTE — Telephone Encounter (Signed)
Dr. Rockey Situ, can you comment on holding plavix?

## 2018-05-05 NOTE — Telephone Encounter (Signed)
Request for surgical clearance:     Endoscopy Procedure  What type of surgery is being performed?     colonoscopy  When is this surgery scheduled?     TBD, 05/14/18?  What type of clearance is required ?   Pharmacy  Are there any medications that need to be held prior to surgery and how long? Plavix, 5-7 days  Practice name and name of physician performing surgery?      [facility]  What is your office phone and fax number?      Phone- [phone]  [phone]  Anesthesia type (None, local, MAC, general) ?       [facility]

## 2018-05-05 NOTE — Telephone Encounter (Signed)
I have given verbal instruction regarding gerd diet, scheduling for upcoming colonoscopy after [place] getting clearance for plavix hold by Dr Rockey Situ and have also given instruction that we have sent medications to patient's pharmacy. Patient verbalizes understanding and is advised that we have sent written aftervisit summary to her home address.Marland Kitchen

## 2018-05-05 NOTE — Telephone Encounter (Signed)
Okay to stop Plavix 5 days before procedure Would be on aspirin 81 mg when not on Plavix  Would go back on the Plavix following procedure and then stop aspirin

## 2018-05-05 NOTE — Addendum Note (Signed)
Addended by: Larina Bras on: 05/05/2018 03:57 PM   Modules accepted: Orders

## 2018-05-05 NOTE — Patient Instructions (Addendum)
We have sent the following medications to your pharmacy for you to pick up at your convenience: Lialda 2.4 grams daily Pantoprazole 40 mg daily Linzess 290 mcg daily  You will be scheduled for colonoscopy as soon as we get clearance from Dr Donivan Scull office for you to hold your Plavix. This should be completed prior to May 31st.  You will require a 2 day preparation for colonoscopy (specifically, 2 days clear liquids; 1 bottle magnesium citrate in the morning the day before procedure, then standard suprep or equivalent split prep).   Decrease your evening insulin (Levemir) from 50 units to 40 units to avoid unwanted hypoglycemia and hold any morning diabetic medications (invokana, levemir) morning of the procedure to avoid unwanted hypoglycemia.   Gastroesophageal Reflux Disease, Adult Gastroesophageal reflux (GER) happens when acid from the stomach flows up into the tube that connects the mouth and the stomach (esophagus). Normally, food travels down the esophagus and stays in the stomach to be digested. With GER, food and stomach acid sometimes move back up into the esophagus. You may have a disease called gastroesophageal reflux disease (GERD) if the reflux:  Happens often.  Causes frequent or very bad symptoms.  Causes problems such as damage to the esophagus. When this happens, the esophagus becomes sore and swollen (inflamed). Over time, GERD can make small holes (ulcers) in the lining of the esophagus. What are the causes? This condition is caused by a problem with the muscle between the esophagus and the stomach. When this muscle is weak or not normal, it does not close properly to keep food and acid from coming back up from the stomach. The muscle can be weak because of:  Tobacco use.  Pregnancy.  Having a certain type of hernia (hiatal hernia).  Alcohol use.  Certain foods and drinks, such as coffee, chocolate, onions, and peppermint. What increases the risk? You are more  likely to develop this condition if you:  Are overweight.  Have a disease that affects your connective tissue.  Use NSAID medicines. What are the signs or symptoms? Symptoms of this condition include:  Heartburn.  Difficult or painful swallowing.  The feeling of having a lump in the throat.  A bitter taste in the mouth.  Bad breath.  Having a lot of saliva.  Having an upset or bloated stomach.  Belching.  Chest pain. Different conditions can cause chest pain. Make sure you see your doctor if you have chest pain.  Shortness of breath or noisy breathing (wheezing).  Ongoing (chronic) cough or a cough at night.  Wearing away of the surface of teeth (tooth enamel).  Weight loss. How is this treated? Treatment will depend on how bad your symptoms are. Your doctor may suggest:  Changes to your diet.  Medicine.  Surgery. Follow these instructions at home: Eating and drinking   Follow a diet as told by your doctor. You may need to avoid foods and drinks such as: ? Coffee and tea (with or without caffeine). ? Drinks that contain alcohol. ? Energy drinks and sports drinks. ? Bubbly (carbonated) drinks or sodas. ? Chocolate and cocoa. ? Peppermint and mint flavorings. ? Garlic and onions. ? Horseradish. ? Spicy and acidic foods. These include peppers, chili powder, curry powder, vinegar, hot sauces, and BBQ sauce. ? Citrus fruit juices and citrus fruits, such as oranges, lemons, and limes. ? Tomato-based foods. These include red sauce, chili, salsa, and pizza with red sauce. ? Fried and fatty foods. These include donuts, french  fries, potato chips, and high-fat dressings. ? High-fat meats. These include hot dogs, rib eye steak, sausage, ham, and bacon. ? High-fat dairy items, such as whole milk, butter, and cream cheese.  Eat small meals often. Avoid eating large meals.  Avoid drinking large amounts of liquid with your meals.  Avoid eating meals during the 2-3  hours before bedtime.  Avoid lying down right after you eat.  Do not exercise right after you eat. Lifestyle   Do not use any products that contain nicotine or tobacco. These include cigarettes, e-cigarettes, and chewing tobacco. If you need help quitting, ask your doctor.  Try to lower your stress. If you need help doing this, ask your doctor.  If you are overweight, lose an amount of weight that is healthy for you. Ask your doctor about a safe weight loss goal. General instructions  Pay attention to any changes in your symptoms.  Take over-the-counter and prescription medicines only as told by your doctor. Do not take aspirin, ibuprofen, or other NSAIDs unless your doctor says it is okay.  Wear loose clothes. Do not wear anything tight around your waist.  Raise (elevate) the head of your bed about 6 inches (15 cm).  Avoid bending over if this makes your symptoms worse.  Keep all follow-up visits as told by your doctor. This is important. Contact a doctor if:  You have new symptoms.  You lose weight and you do not know why.  You have trouble swallowing or it hurts to swallow.  You have wheezing or a cough that keeps happening.  Your symptoms do not get better with treatment.  You have a hoarse voice. Get help right away if:  You have pain in your arms, neck, jaw, teeth, or back.  You feel sweaty, dizzy, or light-headed.  You have chest pain or shortness of breath.  You throw up (vomit) and your throw-up looks like blood or coffee grounds.  You pass out (faint).  Your poop (stool) is bloody or black.  You cannot swallow, drink, or eat. Summary  If a person has gastroesophageal reflux disease (GERD), food and stomach acid move back up into the esophagus and cause symptoms or problems such as damage to the esophagus.  Treatment will depend on how bad your symptoms are.  Follow a diet as told by your doctor.  Take all medicines only as told by your  doctor. This information is not intended to replace advice given to you by your health care provider. Make sure you discuss any questions you have with your health care provider. Document Released: 06/19/2007 Document Revised: 07/09/2017 Document Reviewed: 07/09/2017 Elsevier Interactive Patient Education  2019 [place] for Gastroesophageal Reflux Disease, Adult When you have gastroesophageal reflux disease (GERD), the foods you eat and your eating habits are very important. Choosing the right foods can help ease your discomfort. Think about working with a nutrition specialist (dietitian) to help you make good choices. What are tips for following this plan?  Meals  Choose healthy foods that are low in fat, such as fruits, vegetables, whole grains, low-fat dairy products, and lean meat, fish, and poultry.  Eat small meals often instead of 3 large meals a day. Eat your meals slowly, and in a place where you are relaxed. Avoid bending over or lying down until 2-3 hours after eating.  Avoid eating meals 2-3 hours before bed.  Avoid drinking a lot of liquid with meals.  Cook foods using methods other  than frying. Bake, grill, or broil food instead.  Avoid or limit: ? Chocolate. ? Peppermint or spearmint. ? Alcohol. ? Pepper. ? Black and decaffeinated coffee. ? Black and decaffeinated tea. ? Bubbly (carbonated) soft drinks. ? Caffeinated energy drinks and soft drinks.  Limit high-fat foods such as: ? Fatty meat or fried foods. ? Whole milk, cream, butter, or ice cream. ? Nuts and nut butters. ? Pastries, donuts, and sweets made with butter or shortening.  Avoid foods that cause symptoms. These foods may be different for everyone. Common foods that cause symptoms include: ? Tomatoes. ? Oranges, lemons, and limes. ? Peppers. ? Spicy food. ? Onions and garlic. ? Vinegar. Lifestyle  Maintain a healthy weight. Ask your doctor what weight is healthy for you. If  you need to lose weight, work with your doctor to do so safely.  Exercise for at least 30 minutes for 5 or more days each week, or as told by your doctor.  Wear loose-fitting clothes.  Do not smoke. If you need help quitting, ask your doctor.  Sleep with the head of your bed higher than your feet. Use a wedge under the mattress or blocks under the bed frame to raise the head of the bed. Summary  When you have gastroesophageal reflux disease (GERD), food and lifestyle choices are very important in easing your symptoms.  Eat small meals often instead of 3 large meals a day. Eat your meals slowly, and in a place where you are relaxed.  Limit high-fat foods such as fatty meat or fried foods.  Avoid bending over or lying down until 2-3 hours after eating.  Avoid peppermint and spearmint, caffeine, alcohol, and chocolate. This information is not intended to replace advice given to you by your health care provider. Make sure you discuss any questions you have with your health care provider. Document Released: 07/02/2011 Document Revised: 02/06/2016 Document Reviewed: 02/06/2016 Elsevier Interactive Patient Education  2019 [employer].

## 2018-05-05 NOTE — Progress Notes (Signed)
HISTORY OF PRESENT ILLNESS:  Lindsay Fry is a 58 y.o. female with MULTIPLE SIGNIFICANT medical problems as listed below, including but not limited to, coronary artery disease requiring intervention on chronic Plavix, longstanding diabetes mellitus, morbid obesity, and multiple prior surgeries.  Previous patient of Dr. Delfin Edis who I have been caring for in recent years.  Her significant GI problems include chronic ulcerative colitis, multiple adenomatous colon polyps, family history of colon cancer, chronic GERD requiring PPI for adequate control, and chronic constipation.  She presents today via telemedicine visit due to the coronavirus pandemic regarding management of her chronic GERD, chronic constipation, ulcerative colitis, and the need for surveillance colonoscopy.  She also complains of nausea and requests a refill of Zofran.  First, the patient has chronic GERD for which she takes pantoprazole 40 mg daily.  Off medication she has significant pyrosis and regurgitation.  On medication, symptoms are controlled.  For her colitis she takes Lialda 2.4 g daily.  She denies rectal bleeding.  She continues with chronic constipation for which she takes Linzess 290 mcg daily.  She failed Motegrity therapy.  She tells me that her coronary artery disease has been stable.  She continues on Plavix which we have held for her colonoscopies given the need for multiple polypectomies and biopsies.  Most recent colonoscopies were performed 2016, 2017, 2018, and May 2019.  She continues with multiple adenomatous polyps as well as sessile serrated polyps without dysplasia.  Her last examination revealed 5 polyps.  There was no evidence for active colitis at that time.  Her examinations have been performed WITH DIFFICULTY secondary to a redundant colon and her body habitus.  She also requires a more extensive preparation for adequate cleansing.  I have reviewed her multiple diabetes medications today.  REVIEW OF  SYSTEMS:  All non-GI ROS negative unless otherwise stated in the HPI except for arthritis, anxiety, depression  Past Medical History:  Diagnosis Date  . ADD (attention deficit disorder) 11/19/2012  . Allergic rhinitis, cause unspecified   . Anxiety state, unspecified   . Arthritis   . Asymptomatic postmenopausal status (age-related) (natural)   . Atypical hyperplasia of left breast 2000  . Benign neoplasm of other and unspecified site of the digestive system   . Bipolar disorder ([place])   . Breast cancer ([place])    Left Breast 29mm invasive CA left uiq, dcis left uoq and a lot ADH, ALH in both breasts.  Marland Kitchen CAD (coronary artery disease)    a. 08/2014 NSTEMI/Cath: LM nl, LAD 50p, D1/D2 min irregs, LCX nl, OM1 40, LPDA nl, RI 99ost (2.59mm vessel->med Rx), RCA nl, AM 60, nl EF; b. 11/2016 MV: EF 74%, no ischemia (performed 2/2 dyspnea & new inf TWI).  Marland Kitchen Carcinoma of left breast ([place]) 06/02/2014  . Chronic diastolic CHF (congestive heart failure) ([place])    a. 08/2014 Echo: EF 60-65%, Gr 1 DD, nl LA.  Marland Kitchen Clotting disorder ([place])    on blood thiner, Plavix  . Cyst    on Achilles tendon  . Depression   . Diabetes mellitus ([place])    frank  . Dysuria   . Edema   . Family history of malignant neoplasm of gastrointestinal tract   . Fibromyalgia   . GERD (gastroesophageal reflux disease)   . Glaucoma    pre glaucoma  . Headache    migraines  . Hiatal hernia   . History of alcoholism ([place])   . History of migraines   . History of  ovarian cyst   . Hx of adenomatous colonic polyps   . Insomnia, unspecified   . Myocardial infarction ([place]) 09-04-14  . OCD (obsessive compulsive disorder)   . Osteopenia 09/25/2016   Femoral neck T -1.1  9/18  . Other screening mammogram   . Pure hypercholesterolemia   . Rosacea   . Subacute confusional state 11/19/2012  . Tubular adenoma of colon 01/10/11  . Ulcerative colitis, unspecified   . Unspecified asthma(493.90)   . Unspecified essential hypertension   .  Unspecified hypothyroidism   . Unspecified vitamin D deficiency   . Vertigo     Past Surgical History:  Procedure Laterality Date  . APPENDECTOMY    . AXILLARY SENTINEL NODE BIOPSY Left 05/23/2014   Procedure: AXILLARY SENTINEL NODE BIOPSY;  Surgeon: Christene Lye, MD;  Location: [facility];  Service: General;  Laterality: Left;  . BREAST BIOPSY  Aug 2000   on tamoxifen, atypical hyperplasia  . BREAST BIOPSY Left 04/2014  . BREAST SURGERY Left Aug 2000   lumpectomy/ Dr Sharlet Salina  . BREAST SURGERY Bilateral 05/23/14   Mastectomy  . BUNIONECTOMY Right   . CARDIAC CATHETERIZATION N/A 09/05/2014   Procedure: Left Heart Cath and Coronary Angiography;  Surgeon: Wellington Hampshire, MD;  Location: [facility];  Service: Cardiovascular;  Laterality: N/A;  . CARDIAC CATHETERIZATION  09-05-14  . CATARACT EXTRACTION W/ INTRAOCULAR LENS IMPLANT Bilateral   . CESAREAN SECTION  1996/1997   placenta previa, gest DM, pre-eclampsia  . CHOLECYSTECTOMY  1997   adhesions also  . COLONOSCOPY  01/2001   Ulcerative colitis  . COLONOSCOPY  04/2004   UC, polyp  . COLONOSCOPY  12/08   UC, no polyps  . DEXA  04/1999 and 2010   normal  . ESOPHAGOGASTRODUODENOSCOPY  09/2001   polyp  . exercise stress test  11/2003   negative  . FEMUR FRACTURE SURGERY Left   . MECKEL DIVERTICULUM EXCISION  1999  . NASAL SINUS SURGERY  05/1997  . nuclear stress test  06/2007   negative  . precancerous mole removed    . SIMPLE MASTECTOMY WITH AXILLARY SENTINEL NODE BIOPSY Bilateral 05/23/2014   Procedure: Bilateral simple mastectomy, left sentinel node biopsy ;  Surgeon: Christene Lye, MD;  Location: [facility];  Service: General;  Laterality: Bilateral;  . Sleep study  11/09   no apnea, but did snore (done by [facility])  . TONSILLECTOMY  1984  . TUBAL LIGATION    . WISDOM TOOTH EXTRACTION  1980's    Social History [place]  reports that she quit smoking about 25 years ago. Her smoking use included  cigarettes. She has a 1.25 pack-year smoking history. She has quit using smokeless tobacco. She reports that she does not drink alcohol or use drugs.  family history includes Alzheimer's disease in her father; Breast cancer in an other family member; Colon cancer in her father; Coronary artery disease in her father, mother, and another family member; Crohn's disease in her mother; Dementia in her father; Diabetes in an other family member; Hypertension in her mother; Osteoporosis in her mother; Stroke in her cousin.  Allergies  Allergen Reactions  . Ephedrine Other (See Comments)    Pt becomes hyper  . Aspartame And Phenylalanine Nausea Only  . Aspirin     REACTION: aggrivates colitis  . Crestor [Rosuvastatin]     REACTION: increased lfts  . Erythromycin     REACTION: GI upset  . Nsaids  REACTION: aggrivate colitis  . Oxycodone Other (See Comments)    Panic Attack       PHYSICAL EXAMINATION: No physical examination as this is a telemedicine encounter    ASSESSMENT:  1.  Chronic ulcerative colitis.  Asymptomatic on chronic Lialda therapy 2.  History of multiple adenomatous colon polyps and sessile serrated polyps.  Under close surveillance.  Due for surveillance 3.  Family history of colon cancer in her father 47.  GERD.  Requires PPI for control. 5.  Chronic constipation.  On Linzess for relief 6.  Morbid obesity.  Ongoing 7.  Multiple significant medical problems including insulin requiring diabetes, morbid obesity, and coronary artery disease with prior intervention on chronic Plavix.   PLAN:  1.  Continue Lialda 2.4 g daily.  PROVIDE 1 year REFILLS 2.  Reflux precautions 3.  Continue pantoprazole daily.  PROVIDE 1 year REFILLS 4.  Continue Linzess 290 mcg daily.  PROVIDE 1 year REFILLS 5.  Schedule surveillance COLONOSCOPY WITH ME in [place] BEFORE MAY 31 (patient insurance issues).  The patient is HIGH RISK due to her multiple comorbidities, body habitus, and the need  to adjust her diabetic medications as well as interrupt Plavix therapy.Marland KitchenMarland KitchenThe nature of the procedure, as well as the risks, benefits, and alternatives were carefully and thoroughly reviewed with the patient. Ample time for discussion and questions allowed. The patient understood, was satisfied, and agreed to proceed. 6.  Patient will require a 2-day preparation.  Specifically, 2 days of clear liquids.  1 bottle of magnesium citrate in the morning the day before her procedure, then standard Suprep or equivalent split prep. 7.  Decrease evening insulin from 50 units to 40 units to avoid unwanted hypoglycemia and hold any morning diabetic medications the day of the procedure to avoid unwanted hypoglycemia. 8.  Hold Plavix 5 to 7 days prior to the procedure as we have done previously.  She understands the pros and cons.  This telemedicine visit with the patient was A/V and performed with the patient in her home and myself in my office.  The visit was at her request as she provided consent.  She understands there may be an associated professional charge with this service.

## 2018-05-05 NOTE — Addendum Note (Signed)
Addended by: Larina Bras on: 05/05/2018 04:30 PM   Modules accepted: Orders

## 2018-05-06 ENCOUNTER — Ambulatory Visit (INDEPENDENT_AMBULATORY_CARE_PROVIDER_SITE_OTHER): Payer: BLUE CROSS/BLUE SHIELD | Admitting: Psychology

## 2018-05-06 DIAGNOSIS — F3131 Bipolar disorder, current episode depressed, mild: Secondary | ICD-10-CM | POA: Diagnosis not present

## 2018-05-06 NOTE — Telephone Encounter (Signed)
   Primary Cardiologist: Ida Rogue, MD  Chart reviewed as part of pre-operative protocol coverage. Patient was contacted 05/06/2018 in reference to pre-operative risk assessment for pending surgery as outlined below.  Lindsay Fry was last seen on 11/18/17 by Dr. Rockey Situ.  Per Dr. Rockey Situ: Faythe Ghee to stop Plavix 5 days before procedure Would be on aspirin 81 mg when not on Plavix  Would go back on the Plavix following procedure and then stop aspirin  I called patient and left a message with the above.  I will route this recommendation to the requesting party via Epic fax function and remove from pre-op pool.  Please call with questions.  Tami Lin Duke, PA 05/06/2018, 8:28 AM

## 2018-05-06 NOTE — Telephone Encounter (Signed)
Left message for patient to call back  

## 2018-05-06 NOTE — Telephone Encounter (Signed)
I have spoken to patient to advise that per Dr Donivan Scull office, she may hold Plavix 5 days prior to procedure and take ASA 81 mg while holding the plavix. After the procedure, she should resume Plavix and discontinue ASA. Patient verbalizes understanding of this.

## 2018-05-07 ENCOUNTER — Ambulatory Visit (AMBULATORY_SURGERY_CENTER): Payer: Self-pay

## 2018-05-07 ENCOUNTER — Telehealth: Payer: Self-pay

## 2018-05-07 ENCOUNTER — Other Ambulatory Visit: Payer: Self-pay

## 2018-05-07 ENCOUNTER — Encounter: Payer: Self-pay | Admitting: Internal Medicine

## 2018-05-07 VITALS — Ht 66.5 in | Wt 213.0 lb

## 2018-05-07 DIAGNOSIS — K51919 Ulcerative colitis, unspecified with unspecified complications: Secondary | ICD-10-CM

## 2018-05-07 MED ORDER — NA SULFATE-K SULFATE-MG SULF 17.5-3.13-1.6 GM/177ML PO SOLN: 1.0000 | Freq: Once | ORAL | 0 refills | Status: AC

## 2018-05-07 NOTE — Telephone Encounter (Signed)
That was a good option.  Good work.  Thank you

## 2018-05-07 NOTE — Telephone Encounter (Signed)
Dr. Henrene Pastor,   Juluis Rainier. I  did Lindsay Fry's Pre-Visit today by phone. I followed your instructions regarding prep and medications however Lindsay Fry refused to drink Magnesium Citrate. Patient states it makes her violently ill. I suggested that she take the anti- nausea medication that she already has on hand, and she said, " No I wont do that". Lindsay Fry states that she will do the Miralax 2 day prep so I changed the instructions to Miralax and Suprep. Lindsay Fry did not have a problem with a clear liquid diet for two days and she tolerates Suprep. I just wanted you to know that she refused and I had to go to the next best option for her otherwise, she wasn't going to do the procedure. If you are opposed to the plan you can have someone call her. I don't think that there will be anyone in [place] until next week. Thanks!  Riki Sheer, LPN ( [place] )

## 2018-05-07 NOTE — Progress Notes (Signed)
Denies allergies to eggs or soy products. Denies complication of anesthesia or sedation. Denies use of weight loss medication. Denies use of O2.   Emmi instructions declined.   Pre-Visit was conducted by phone due to Covid 19. Instructions were reviewed with patient per Dr. Blanch Media orders. Lindsay Fry states that she can not tolerate Magnesium Citrate and she refused to prep using Magnesium Citrate. I suggested that she try taking one of her anti-nausea medications that she already has on hand prior to starting the prep. Patient abosolutely refused and said that she could not tolerate Magnesium Citrate.  Lindsay Fry said that she would do the two day prep with Miralax,and Dulcolax. Her prep was re-written using Miralax 2 day prep. Lindsay Fry did not have any issues regarding clear liquids for two full days prior and she states she doesn't have any difficulty with Suprep. Instructions were mailed to the patients confirmed home address. Patient was encouraged to call with any questions or concerns regarding instructions. Patient declined a Suprep coupon because she has met her deductible.

## 2018-05-12 DIAGNOSIS — L820 Inflamed seborrheic keratosis: Secondary | ICD-10-CM | POA: Diagnosis not present

## 2018-05-12 DIAGNOSIS — L739 Follicular disorder, unspecified: Secondary | ICD-10-CM | POA: Diagnosis not present

## 2018-05-13 ENCOUNTER — Telehealth: Payer: Self-pay | Admitting: *Deleted

## 2018-05-13 NOTE — Telephone Encounter (Signed)
LMOM to see if she can come in at 1130 for a 1230 pm appointment.  Also, asked her to call in if she has had any fever or respiratory problems, travel or family members diagnosed with COVID 19 in the past 14 days.

## 2018-05-14 ENCOUNTER — Other Ambulatory Visit: Payer: Self-pay

## 2018-05-14 ENCOUNTER — Encounter: Payer: Self-pay | Admitting: Internal Medicine

## 2018-05-14 ENCOUNTER — Ambulatory Visit (AMBULATORY_SURGERY_CENTER): Payer: BLUE CROSS/BLUE SHIELD | Admitting: Internal Medicine

## 2018-05-14 VITALS — BP 147/72 | HR 70 | Temp 99.0°F | Resp 19 | Ht 66.5 in | Wt 213.0 lb

## 2018-05-14 DIAGNOSIS — Z86010 Personal history of colonic polyps: Secondary | ICD-10-CM

## 2018-05-14 DIAGNOSIS — K5100 Ulcerative (chronic) pancolitis without complications: Secondary | ICD-10-CM

## 2018-05-14 DIAGNOSIS — D124 Benign neoplasm of descending colon: Secondary | ICD-10-CM | POA: Diagnosis not present

## 2018-05-14 DIAGNOSIS — K5190 Ulcerative colitis, unspecified, without complications: Secondary | ICD-10-CM | POA: Diagnosis not present

## 2018-05-14 DIAGNOSIS — Z8719 Personal history of other diseases of the digestive system: Secondary | ICD-10-CM | POA: Diagnosis not present

## 2018-05-14 MED ORDER — SODIUM CHLORIDE 0.9 % IV SOLN: 500.0000 mL | Freq: Once | INTRAVENOUS | Status: DC

## 2018-05-14 NOTE — Progress Notes (Signed)
Called to room to assist during endoscopic procedure.  Patient ID and intended procedure confirmed with present staff. Received instructions for my participation in the procedure from the performing physician.  

## 2018-05-14 NOTE — Op Note (Signed)
[place] Patient Name: Lindsay Fry Procedure Date: 05/14/2018 12:22 PM MRN: [id number] Endoscopist: Docia Chuck. Henrene Pastor , MD Age: 58 Referring MD:  Date of Birth: 1960-10-17 Gender: Female Account #: [id number] Procedure:                Colonoscopy with cold snare polypectomy x 2; with                            biopsies Indications:              High risk colon cancer surveillance: Personal                            history of multiple (3 or more) adenomas, High risk                            colon cancer surveillance: Ulcerative pancolitis of                            8 (or more) years duration Medicines:                Monitored Anesthesia Care Procedure:                Pre-Anesthesia Assessment:                           - Prior to the procedure, a History and Physical                            was performed, and patient medications and                            allergies were reviewed. The patient's tolerance of                            previous anesthesia was also reviewed. The risks                            and benefits of the procedure and the sedation                            options and risks were discussed with the patient.                            All questions were answered, and informed consent                            was obtained. Prior Anticoagulants: The patient has                            taken Plavix (clopidogrel), last dose was 7 days                            prior to procedure. ASA Grade Assessment: III - A  patient with severe systemic disease. After                            reviewing the risks and benefits, the patient was                            deemed in satisfactory condition to undergo the                            procedure.                           After obtaining informed consent, the colonoscope                            was passed under direct vision. Throughout the        procedure, the patient's blood pressure, pulse, and                            oxygen saturations were monitored continuously. The                            Colonoscope was introduced through the anus and                            advanced to the the cecum, identified by                            appendiceal orifice and ileocecal valve. The                            ileocecal valve, appendiceal orifice, and rectum                            were photographed. The quality of the bowel                            preparation was adequate to identify polyps. The                            colonoscopy was performed without difficulty. The                            patient tolerated the procedure well. The bowel                            preparation used was SUPREP but has extensive prep. Scope In: 12:38:33 PM Scope Out: 1:10:06 PM Scope Withdrawal Time: 0 hours 19 minutes 28 seconds  Total Procedure Duration: 0 hours 31 minutes 33 seconds  Findings:                 Two sessile polyps were found in the descending  colon. The polyps were 5 mm in size. These polyps                            were removed with a cold snare. Resection and                            retrieval were complete.                           The entire examined colon appeared otherwise                            grossly normal on direct and retroflexion views.                            Four biopsies were taken every 10 cm with a cold                            forceps from the entire colon for ulcerative                            colitis surveillance. These biopsy specimens were                            sent to Pathology. Complications:            No immediate complications. Estimated blood loss:                            None. Estimated Blood Loss:     Estimated blood loss: none. Impression:               - Two 5 mm polyps in the descending colon, removed                             with a cold snare. Resected and retrieved.                           - The entire examined colon is normal on direct and                            retroflexion views. Recommendation:           - Repeat colonoscopy in 1 year for surveillance.                            Extensive prep.                           - Resume Plavix (clopidogrel) today at prior dose.                           - Patient has a contact number available for  emergencies. The signs and symptoms of potential                            delayed complications were discussed with the                            patient. Return to normal activities tomorrow.                            Written discharge instructions were provided to the                            patient.                           - Resume previous diet.                           - Continue present medications.                           - Await pathology results. Docia Chuck. Henrene Pastor, MD 05/14/2018 1:17:28 PM This report has been signed electronically.

## 2018-05-14 NOTE — Progress Notes (Signed)
PT taken to PACU. Monitors in place. VSS. Report given to RN. 

## 2018-05-14 NOTE — Patient Instructions (Signed)
Handout given for polyps.  Resume plavix today.  YOU HAD AN ENDOSCOPIC PROCEDURE TODAY AT [facility]:   Refer to the procedure report that was given to you for any specific questions about what was found during the examination.  If the procedure report does not answer your questions, please call your gastroenterologist to clarify.  If you requested that your care partner not be given the details of your procedure findings, then the procedure report has been included in a sealed envelope for you to review at your convenience later.  YOU SHOULD EXPECT: Some feelings of bloating in the abdomen. Passage of more gas than usual.  Walking can help get rid of the air that was put into your GI tract during the procedure and reduce the bloating. If you had a lower endoscopy (such as a colonoscopy or flexible sigmoidoscopy) you may notice spotting of blood in your stool or on the toilet paper. If you underwent a bowel prep for your procedure, you may not have a normal bowel movement for a few days.  Please Note:  You might notice some irritation and congestion in your nose or some drainage.  This is from the oxygen used during your procedure.  There is no need for concern and it should clear up in a day or so.  SYMPTOMS TO REPORT IMMEDIATELY:   Following lower endoscopy (colonoscopy or flexible sigmoidoscopy):  Excessive amounts of blood in the stool  Significant tenderness or worsening of abdominal pains  Swelling of the abdomen that is new, acute  Fever of 100F or higher   For urgent or emergent issues, a gastroenterologist can be reached at any hour by calling [phone].   DIET:  We do recommend a small meal at first, but then you may proceed to your regular diet.  Drink plenty of fluids but you should avoid alcoholic beverages for 24 hours.  ACTIVITY:  You should plan to take it easy for the rest of today and you should NOT DRIVE or use heavy machinery until tomorrow  (because of the sedation medicines used during the test).    FOLLOW UP: Our staff will call the number listed on your records the next business day following your procedure to check on you and address any questions or concerns that you may have regarding the information given to you following your procedure. If we do not reach you, we will leave a message.  However, if you are feeling well and you are not experiencing any problems, there is no need to return our call.  We will assume that you have returned to your regular daily activities without incident.  If any biopsies were taken you will be contacted by phone or by letter within the next 1-3 weeks.  Please call us at [phone] if you have not heard about the biopsies in 3 weeks.    SIGNATURES/CONFIDENTIALITY: You and/or your care partner have signed paperwork which will be entered into your electronic medical record.  These signatures attest to the fact that that the information above on your After Visit Summary has been reviewed and is understood.  Full responsibility of the confidentiality of this discharge information lies with you and/or your care-partner.

## 2018-05-18 ENCOUNTER — Encounter: Payer: Self-pay | Admitting: Internal Medicine

## 2018-05-18 ENCOUNTER — Telehealth: Payer: Self-pay

## 2018-05-18 NOTE — Telephone Encounter (Signed)
Left message on 2nd follow up call. 

## 2018-05-18 NOTE — Telephone Encounter (Signed)
First post procedure follow up call, no answer 

## 2018-05-22 ENCOUNTER — Other Ambulatory Visit: Payer: Self-pay

## 2018-05-24 ENCOUNTER — Other Ambulatory Visit: Payer: Self-pay | Admitting: Endocrinology

## 2018-05-25 ENCOUNTER — Other Ambulatory Visit: Payer: Self-pay | Admitting: Endocrinology

## 2018-05-26 ENCOUNTER — Telehealth: Payer: Self-pay | Admitting: *Deleted

## 2018-05-26 NOTE — Telephone Encounter (Signed)
LMOM- asked to call back to go over post procedure covid questions

## 2018-05-26 NOTE — Telephone Encounter (Signed)
1. Have you developed a fever since your procedure? no  2.   Have you had an respiratory symptoms (SOB or cough) since your procedure? no  3.   Have you tested positive for COVID 19 since your procedure no  3.   Have you had any family members/close contacts diagnosed with the COVID 19 since your procedure?  no   If any of these questions are a yes, please inquire if patient has been seen by family doctor and route this note to Tracy Walton, RN.  

## 2018-05-28 ENCOUNTER — Other Ambulatory Visit: Payer: Self-pay | Admitting: Internal Medicine

## 2018-06-03 ENCOUNTER — Ambulatory Visit (INDEPENDENT_AMBULATORY_CARE_PROVIDER_SITE_OTHER): Payer: BLUE CROSS/BLUE SHIELD | Admitting: Psychology

## 2018-06-03 DIAGNOSIS — F3131 Bipolar disorder, current episode depressed, mild: Secondary | ICD-10-CM | POA: Diagnosis not present

## 2018-06-09 ENCOUNTER — Other Ambulatory Visit: Payer: Self-pay

## 2018-06-09 MED ORDER — GLUCOSE BLOOD VI STRP: ORAL_STRIP | 2 refills | Status: DC

## 2018-06-16 ENCOUNTER — Emergency Department
Admission: EM | Admit: 2018-06-16 | Discharge: 2018-06-16 | Disposition: A | Discharge status: Home / Self Care | Payer: BC Managed Care – PPO | Attending: Emergency Medicine | Admitting: Emergency Medicine

## 2018-06-16 ENCOUNTER — Telehealth: Payer: Self-pay | Admitting: Cardiovascular Disease

## 2018-06-16 ENCOUNTER — Other Ambulatory Visit: Payer: Self-pay

## 2018-06-16 DIAGNOSIS — E039 Hypothyroidism, unspecified: Secondary | ICD-10-CM | POA: Insufficient documentation

## 2018-06-16 DIAGNOSIS — I110 Hypertensive heart disease with heart failure: Secondary | ICD-10-CM | POA: Diagnosis not present

## 2018-06-16 DIAGNOSIS — Z79899 Other long term (current) drug therapy: Secondary | ICD-10-CM | POA: Insufficient documentation

## 2018-06-16 DIAGNOSIS — I5032 Chronic diastolic (congestive) heart failure: Secondary | ICD-10-CM | POA: Diagnosis not present

## 2018-06-16 DIAGNOSIS — Z21 Asymptomatic human immunodeficiency virus [HIV] infection status: Secondary | ICD-10-CM | POA: Diagnosis not present

## 2018-06-16 DIAGNOSIS — Z87891 Personal history of nicotine dependence: Secondary | ICD-10-CM | POA: Diagnosis not present

## 2018-06-16 DIAGNOSIS — Z7902 Long term (current) use of antithrombotics/antiplatelets: Secondary | ICD-10-CM | POA: Diagnosis not present

## 2018-06-16 DIAGNOSIS — I2510 Atherosclerotic heart disease of native coronary artery without angina pectoris: Secondary | ICD-10-CM | POA: Insufficient documentation

## 2018-06-16 DIAGNOSIS — R42 Dizziness and giddiness: Secondary | ICD-10-CM | POA: Insufficient documentation

## 2018-06-16 DIAGNOSIS — I252 Old myocardial infarction: Secondary | ICD-10-CM | POA: Diagnosis not present

## 2018-06-16 DIAGNOSIS — E119 Type 2 diabetes mellitus without complications: Secondary | ICD-10-CM | POA: Insufficient documentation

## 2018-06-16 DIAGNOSIS — Z794 Long term (current) use of insulin: Secondary | ICD-10-CM | POA: Diagnosis not present

## 2018-06-16 DIAGNOSIS — J45909 Unspecified asthma, uncomplicated: Secondary | ICD-10-CM | POA: Diagnosis not present

## 2018-06-16 LAB — URINALYSIS, COMPLETE (UACMP) WITH MICROSCOPIC
Bacteria, UA: NONE SEEN
Bilirubin Urine: NEGATIVE
Glucose, UA: >=500 mg/dL — AB
Hgb urine dipstick: NEGATIVE
Ketones, ur: NEGATIVE mg/dL
Leukocytes,Ua: NEGATIVE
Nitrite: NEGATIVE
Protein, ur: NEGATIVE mg/dL
Specific Gravity, Urine: 1.008 (ref 1.005–1.030)
pH: 6 (ref 5.0–8.0)

## 2018-06-16 LAB — CBC
HCT: 48.5 % — ABNORMAL HIGH (ref 36.0–46.0)
Hemoglobin: 15.9 g/dL — ABNORMAL HIGH (ref 12.0–15.0)
MCH: 31.2 pg (ref 26.0–34.0)
MCHC: 32.8 g/dL (ref 30.0–36.0)
MCV: 95.3 fL (ref 80.0–100.0)
Platelets: 294 10*3/uL (ref 150–400)
RBC: 5.09 MIL/uL (ref 3.87–5.11)
RDW: 13.4 % (ref 11.5–15.5)
WBC: 9.3 10*3/uL (ref 4.0–10.5)
nRBC: 0 % (ref 0.0–0.2)

## 2018-06-16 LAB — BASIC METABOLIC PANEL
Anion gap: 9 (ref 5–15)
BUN: 12 mg/dL (ref 6–20)
CO2: 23 mmol/L (ref 22–32)
Calcium: 9.5 mg/dL (ref 8.9–10.3)
Chloride: 106 mmol/L (ref 98–111)
Creatinine, Ser: 0.94 mg/dL (ref 0.44–1.00)
GFR calc Af Amer: >60 mL/min (ref >60)
GFR calc non Af Amer: >60 mL/min (ref >60)
Glucose, Bld: 99 mg/dL (ref 70–99)
Potassium: 3.7 mmol/L (ref 3.5–5.1)
Sodium: 138 mmol/L (ref 135–145)

## 2018-06-16 MED ORDER — SODIUM CHLORIDE 0.9% FLUSH
3.0000 mL | Freq: Once | INTRAVENOUS | Status: AC
  Administered 2018-06-16: 3 mL via INTRAVENOUS

## 2018-06-16 NOTE — ED Notes (Signed)
Provider at bedside

## 2018-06-16 NOTE — ED Provider Notes (Signed)
[facility] EMERGENCY DEPARTMENT Provider Note   CSN: [id number] Arrival date & time: 06/16/18  1520    History   Chief Complaint Chief Complaint  Patient presents with  . Dizziness    HPI Lindsay Fry is a 58 y.o. female.     Patient presents to the emergency department after experiencing an episode of dizziness.  She states that it lasted approximately 30 seconds.  She states that her husband checked blood pressure and found it to be low.  She states that she then decided to drink some Gatorade and see if that helped.  She states that once she was able to drink some fluids and rest for a few minutes that the dizziness resolved.  Because it lasted longer than usual she decided to come to the emergency department for evaluation.  She denies any chest pain, shortness of breath, nausea, vomiting, diarrhea, recent illness, rash, sore throat, or fever.  No known ill exposures.  She is back to her usual state of health.     Past Medical History:  Diagnosis Date  . ADD (attention deficit disorder) 11/19/2012  . Allergic rhinitis, cause unspecified   . Anxiety state, unspecified   . Arthritis   . Asymptomatic postmenopausal status (age-related) (natural)   . Atypical hyperplasia of left breast 2000  . Benign neoplasm of other and unspecified site of the digestive system   . Bipolar disorder ([place])   . Breast cancer ([place])    Left Breast 39m invasive CA left uiq, dcis left uoq and a lot ADH, ALH in both breasts.  .Marland KitchenCAD (coronary artery disease)    a. 08/2014 NSTEMI/Cath: LM nl, LAD 50p, D1/D2 min irregs, LCX nl, OM1 40, LPDA nl, RI 99ost (2.074mvessel->med Rx), RCA nl, AM 60, nl EF; b. 11/2016 MV: EF 74%, no ischemia (performed 2/2 dyspnea & new inf TWI).  . Marland Kitchenarcinoma of left breast (HC[facility]rens[facility]5/19/2016  . Cataract   . Chronic diastolic CHF (congestive heart failure) ([facility]   a. 08/2014 Echo: EF 60-65%, Gr 1 DD, nl LA.  . Marland Kitchenlotting disorder (HCNe[place]   on blood  thiner, Plavix  . Cyst    on Achilles tendon  . Depression   . Diabetes mellitus ([facility]CM[facility]   frank  . Dysuria   . Edema   . Family history of malignant neoplasm of gastrointestinal tract   . Fibromyalgia   . GERD (gastroesophageal reflux disease)   . Glaucoma    pre glaucoma, 05/14/2018 pt. denies glaucoma  . Headache    migraines  . Hiatal hernia   . History of alcoholism (HCTwisp  . History of migraines   . History of ovarian cyst   . Hx of adenomatous colonic polyps   . Insomnia, unspecified   . Myocardial infarction (HCPaulden8-21-16  . OCD (obsessive compulsive disorder)   . Osteopenia 09/25/2016   Femoral neck T -1.1  9/18  . Other screening mammogram   . Pure hypercholesterolemia   . Rosacea   . Subacute confusional state 11/19/2012  . Tubular adenoma of colon 01/10/11  . Ulcerative colitis, unspecified   . Unspecified asthma(493.90)   . Unspecified essential hypertension   . Unspecified hypothyroidism   . Unspecified vitamin D deficiency   . Vertigo     Patient Active Problem List   Diagnosis Date Noted  . Thoracic back pain 02/19/2018  . Uncontrolled type 2 diabetes mellitus with hyperglycemia, with long-term current use of insulin (HCMuscotah02/06/2018  .  Pleural thickening 11/05/2017  . Abrasion of right knee 11/05/2017  . Fall 11/05/2017  . Mouth ulcers 11/05/2017  . Cervical radiculopathy due to degenerative joint disease of spine 10/28/2017  . 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 Supraventricular tachycardia ([place]) 04/22/2017  . Osteopenia 09/25/2016  . Estrogen deficiency 05/30/2016  . Need for hepatitis C screening test 03/19/2016  . Screening for HIV (human immunodeficiency virus) 03/19/2016  . Carcinoma of overlapping sites of left breast in female, estrogen receptor positive ([place]) 08/21/2015  . Chronic constipation 01/18/2015  . NSTEMI (non-ST elevated myocardial infarction) ([place]) 09/06/2014  . CAD (coronary artery disease) 09/06/2014  . DM (diabetes mellitus) ([place])  09/05/2014  . 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 Carcinoma of left breast ([place]) 06/02/2014  . Sinus tachycardia 04/27/2014  . Class 2 severe obesity due to excess calories with serious comorbidity and body mass index (BMI) of 36.0 to 36.9 in adult ([place]) 05/20/2013  . Bladder pain 02/17/2013  . ADD (attention deficit disorder) 11/19/2012  . Urinary frequency 08/05/2012  . Low back pain 08/05/2012  . 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 Chronic sinusitis 01/24/2012  . Vertigo, benign positional 01/24/2012  . Other screening mammogram 12/05/2010  . Routine general medical examination at a health care facility 10/14/2010  . FATIGUE 08/09/2009  . POSTMENOPAUSAL STATUS 06/22/2008  . Vitamin D deficiency 03/18/2008  . BENIGN NEOPLASM [place] SITE DIGESTIVE SYSTEM 01/09/2007  . HIATAL HERNIA 01/09/2007  . COLONIC POLYPS, ADENOMATOUS, HX OF 01/09/2007  . HYPERCHOLESTEROLEMIA 01/07/2007  . Bipolar disorder, unspecified ([place]) 01/07/2007  . Bipolar I disorder, most recent episode depressed ([place]) 01/07/2007  . Essential hypertension 01/07/2007  . ALLERGIC RHINITIS 01/07/2007  . ASTHMA 01/07/2007  . GERD 01/07/2007  . Ulcerative colitis ([place]) 01/07/2007  . ACNE ROSACEA 01/07/2007  . MIGRAINES, HX OF 01/07/2007  . Fibromyalgia 10/30/2006  . INSOMNIA 10/30/2006    Past Surgical History:  Procedure Laterality Date  . APPENDECTOMY    . AXILLARY SENTINEL NODE BIOPSY Left 05/23/2014   Procedure: AXILLARY SENTINEL NODE BIOPSY;  Surgeon: Christene Lye, MD;  Location: [facility] ORS;  Service: General;  Laterality: Left;  . BREAST BIOPSY  Aug 2000   on tamoxifen, atypical hyperplasia  . BREAST BIOPSY Left 04/2014  . BREAST SURGERY Left Aug 2000   lumpectomy/ Dr Sharlet Salina  . BREAST SURGERY Bilateral 05/23/14   Mastectomy  . BUNIONECTOMY Right   . CARDIAC CATHETERIZATION N/A 09/05/2014   Procedure: Left Heart Cath and Coronary Angiography;  Surgeon: Wellington Hampshire,  MD;  Location: [facility] LAB;  Service: Cardiovascular;  Laterality: N/A;  . CARDIAC CATHETERIZATION  09-05-14  . CATARACT EXTRACTION W/ INTRAOCULAR LENS IMPLANT Bilateral   . CESAREAN SECTION  1996/1997   placenta previa, gest DM, pre-eclampsia  . CHOLECYSTECTOMY  1997   adhesions also  . COLONOSCOPY  01/2001   Ulcerative colitis  . COLONOSCOPY  04/2004   UC, polyp  . COLONOSCOPY  12/08   UC, no polyps  . DEXA  04/1999 and 2010   normal  . ESOPHAGOGASTRODUODENOSCOPY  09/2001   polyp  . exercise stress test  11/2003   negative  . FEMUR FRACTURE SURGERY Left   . MASTECTOMY Bilateral   . MECKEL DIVERTICULUM EXCISION  1999  . NASAL SINUS SURGERY  05/1997  . nuclear stress test  06/2007   negative  . precancerous mole removed    . SIMPLE MASTECTOMY WITH AXILLARY SENTINEL NODE BIOPSY Bilateral 05/23/2014   Procedure: Bilateral simple mastectomy,  left sentinel node biopsy ;  Surgeon: Christene Lye, MD;  Location: [facility] ORS;  Service: General;  Laterality: Bilateral;  . Sleep study  11/09   no apnea, but did snore (done by [facility])  . TONSILLECTOMY  1984  . TUBAL LIGATION    . WISDOM TOOTH EXTRACTION  1980's     OB History    Gravida  2   Para  2   Term      Preterm      AB      Living        SAB      TAB      Ectopic      Multiple      Live Births           Obstetric Comments  1st Menstrual Cycle:  11  1st Pregnancy:  33         Home Medications    Prior to Admission medications   Medication Sig Start Date End Date Taking? Authorizing Provider  acetaminophen (TYLENOL) 650 MG CR tablet Take 650 mg by mouth every 8 (eight) hours as needed for pain.    [provider]  amantadine (SYMMETREL) 100 MG capsule Take 100 mg by mouth 2 (two) times daily.    [provider]  atorvastatin (LIPITOR) 80 MG tablet TAKE 1 TABLET(80 MG) BY MOUTH DAILY 01/16/18   Minna Merritts, MD  B-D UF III MINI PEN NEEDLES 31G X 5 MM MISC USE FOR  INJECTIONS TWICE DAILY 05/05/18   Elayne Snare, MD  BAYER MICROLET LANCETS lancets USE AS DIRECTED TO CHECK BLOOD SUGAR TWICE DAILY 05/20/16   Elayne Snare, MD  Blood Glucose Monitoring Suppl (CONTOUR NEXT EZ MONITOR) w/Device KIT USE AS DIRECTED TO CHECK BLOOD SUGAR TWICE DAILY 05/20/16   Elayne Snare, MD  Calcium Carb-Cholecalciferol (CALCIUM PLUS VITAMIN D3) 600-800 MG-UNIT TABS Take 1 tablet by mouth daily.    [provider]  chlorproMAZINE (THORAZINE) 25 MG tablet Take 25-50 mg by mouth as needed (migraines).     [provider]  clopidogrel (PLAVIX) 75 MG tablet TAKE 1 TABLET(75 MG) BY MOUTH DAILY 04/13/18   Minna Merritts, MD  co-enzyme Q-10 50 MG capsule Take 50 mg by mouth daily.    [provider]  Cyanocobalamin (VITAMIN B-12 PO) 3000 mcg-- 2 gummies daily    [provider]  diazepam (VALIUM) 10 MG tablet Take 2.5-10 mg by mouth as needed for anxiety.    [provider]  diltiazem (CARDIZEM CD) 240 MG 24 hr capsule TAKE 1 CAPSULE(240 MG) BY MOUTH DAILY 12/18/17   Minna Merritts, MD  diphenoxylate-atropine (LOMOTIL) 2.5-0.025 MG tablet Take 1-2 tabs daily as needed for diarrhea 12/26/17   Milus Banister, MD  famotidine (PEPCID) 40 MG tablet Take 40 mg by mouth 2 (two) times daily.    [provider]  FIBER SELECT GUMMIES PO Take 3 capsules by mouth daily.    [provider]  gabapentin (NEURONTIN) 300 MG capsule TAKE 1 CAPSULE(300 MG) BY MOUTH THREE TIMES DAILY 04/29/18   Tower, Roque Lias A, MD  glucose blood (BAYER CONTOUR NEXT TEST) test strip USE AS DIRECTED TO CHECK BLOOD SUGAR TWICE DAILY 06/09/18   Elayne Snare, MD  Insulin Detemir (LEVEMIR FLEXTOUCH) 100 UNIT/ML Pen Inject 50 Units into the skin at bedtime. INJECT 50 UNITS UNDER THE SKIN DAILY AT 10 PM 12/08/17   Elayne Snare, MD  Insulin Pen Needle (B-D UF III  MINI PEN NEEDLES) 31G X 5 MM MISC USE FOR INJECTIONS TWICE DAILY 05/05/17   Elayne Snare, MD  INVOKANA 300 MG TABS tablet  TAKE 1 TABLET(300 MG) BY MOUTH DAILY BEFORE BREAKFAST 04/17/18   Elayne Snare, MD  lamoTRIgine (LAMICTAL) 200 MG tablet Take 400 mg by mouth daily.    [provider]  latanoprost (XALATAN) 0.005 % ophthalmic solution Place 1 drop into both eyes at bedtime as needed.  10/12/10   [provider]  letrozole ([place]) 2.5 MG tablet TAKE 1 TABLET(2.5 MG) BY MOUTH DAILY 03/11/18   Cammie Sickle, MD  linaclotide [facility]) 290 MCG CAPS capsule Take 1 capsule (290 mcg total) by mouth daily before breakfast. 05/05/18   Irene Shipper, MD  lithium carbonate (ESKALITH) 450 MG CR tablet Take 900 mg by mouth at bedtime.    [provider]  Melatonin 10 MG CAPS Take 1 capsule by mouth at bedtime.    [provider]  mesalamine (LIALDA) 1.2 g EC tablet Take 2 tablets (2.4 g total) by mouth daily. 05/05/18   Irene Shipper, MD  metoprolol succinate (TOPROL-XL) 50 MG 24 hr tablet Take 25 mg by mouth daily.  10/19/17   [provider]  Multiple Vitamin (MULTIVITAMIN) tablet Take 1 tablet by mouth daily.      [provider]  Omega-3 Krill Oil 500 MG CAPS Take 500 mg by mouth.    [provider]  ondansetron (ZOFRAN-ODT) 4 MG disintegrating tablet Take 1 tablet (4 mg total) by mouth every 6 (six) hours as needed for nausea or vomiting. 05/05/18   Irene Shipper, MD  OZEMPIC, 1 MG/DOSE, 2 MG/1.5ML SOPN INJECT 1 MG UNDER THE SKIN ONCE A WEEK 05/25/18   Elayne Snare, MD  pantoprazole (PROTONIX) 40 MG tablet TAKE 1 TABLET(40 MG) BY MOUTH TWICE DAILY 05/28/18   Irene Shipper, MD  Probiotic Product (PROBIOTIC DAILY PO) Take by mouth.    [provider]  QUEtiapine (SEROQUEL XR) 400 MG 24 hr tablet Take 800 mg by mouth at bedtime.    [provider]  Simethicone (GAS-X PO) Take 1 tablet by mouth 4 (four) times daily.    [provider]  solifenacin (VESICARE) 5 MG tablet Take 1 tablet by mouth daily. 02/05/18   [provider]   SYNTHROID 200 MCG tablet TAKE 1 TABLET BY MOUTH EVERY DAY 05/25/18   Elayne Snare, MD  triamcinolone (KENALOG) 0.1 % paste Use as directed 1 application in the mouth or throat 2 (two) times daily. To affected areas Patient not taking: Reported on 05/14/2018 11/05/17   Tower, Wynelle Fanny, MD  valACYclovir (VALTREX) 1000 MG tablet TAKE 1 TABLET(1000 MG) BY MOUTH DAILY 05/01/18   Tower, Wynelle Fanny, MD  vitamin C (ASCORBIC ACID) 500 MG tablet Take 250 mg by mouth daily. Takes 2 a day    [provider]  VYVANSE 60 MG capsule Take 60 mg by mouth every morning.  10/05/17   [provider]  zaleplon (SONATA) 10 MG capsule Take 20 mg by mouth at bedtime as needed for sleep.    [provider]    Family History Family History  Problem Relation Age of Onset  . Coronary artery disease Father   . Colon cancer Father   . Alzheimer's disease Father   . Dementia Father   . Colon polyps Father   . Coronary artery disease Mother   . Hypertension Mother   . Osteoporosis Mother   .  Crohn's disease Mother        crohns colitis  . Breast cancer Other        great aunts  . Coronary artery disease Other        Uncle (also AAA)  . Diabetes Other        remote family history  . Stroke Cousin   . Esophageal cancer Neg Hx   . Rectal cancer Neg Hx   . Stomach cancer Neg Hx     Social History Social History   Tobacco Use  . Smoking status: Former Smoker    Packs/day: 0.25    Years: 5.00    Pack years: 1.25    Types: Cigarettes    Last attempt to quit: 01/14/1993    Years since quitting: 25.4  . Smokeless tobacco: Never Used  Substance Use Topics  . Alcohol use: No    Alcohol/week: 0.0 standard drinks    Comment: Recovered ETOH  . Drug use: No     Allergies   Ephedrine; Aspartame and phenylalanine; Aspirin; Crestor [rosuvastatin]; Erythromycin; Nsaids; and Oxycodone   Review of Systems Review of Systems  Constitutional: Negative for diaphoresis, fatigue and fever.  HENT:  Negative for hearing loss.   Respiratory: Negative for cough and shortness of breath.   Cardiovascular: Negative for chest pain.  Gastrointestinal: Negative for diarrhea, nausea and vomiting.  Genitourinary: Negative for dysuria.  Musculoskeletal: Negative for arthralgias and joint swelling.  Skin: Negative.   Neurological: Positive for light-headedness.     Physical Exam Updated Vital Signs BP 120/70 (BP Location: Right Arm)   Pulse 85   Temp 98 F (36.7 C) (Oral)   Resp 16   Ht '5\' 6"'$  (1.676 m)   Wt 96.6 kg   LMP 08/23/2006 (Approximate) Comment: tubal ligation  SpO2 90%   BMI 34.38 kg/m   Physical Exam Vitals signs and nursing note reviewed.  Constitutional:      General: She is not in acute distress.    Appearance: Normal appearance. She is not ill-appearing or diaphoretic.  HENT:     Head: Atraumatic.     Nose: Nose normal.     Mouth/Throat:     Mouth: Mucous membranes are moist.  Eyes:     Pupils: Pupils are equal, round, and reactive to light.  Neck:     Musculoskeletal: Normal range of motion.  Cardiovascular:     Rate and Rhythm: Normal rate and regular rhythm.  Pulmonary:     Effort: Pulmonary effort is normal.     Breath sounds: Normal breath sounds.  Abdominal:     Palpations: Abdomen is soft.  Musculoskeletal: Normal range of motion.  Skin:    General: Skin is warm and dry.     Capillary Refill: Capillary refill takes less than 2 seconds.  Neurological:     Mental Status: She is alert.  Psychiatric:        Mood and Affect: Mood normal.        Behavior: Behavior normal.        Thought Content: Thought content normal.      ED Treatments / Results  Labs (all labs ordered are listed, but only abnormal results are displayed) Labs Reviewed  CBC - Abnormal; Notable for the following components:      Result Value   Hemoglobin 15.9 (*)    HCT 48.5 (*)    All other components within normal limits  URINALYSIS, COMPLETE (UACMP) WITH MICROSCOPIC -  Abnormal; Notable for the following  components:   Color, Urine YELLOW (*)    APPearance CLEAR (*)    Glucose, UA >=500 (*)    All other components within normal limits  BASIC METABOLIC PANEL  CBG MONITORING, ED    EKG None  Radiology No results found.  Procedures Procedures (including critical care time)  Medications Ordered in ED Medications  sodium chloride flush (NS) 0.9 % injection 3 mL (3 mLs Intravenous Given 06/16/18 1640)     Initial Impression / Assessment and Plan / ED Course  I have reviewed the triage vital signs and the nursing notes.  Pertinent labs & imaging results that were available during my care of the patient were reviewed by me and considered in my medical decision making (see chart for details).        58 year old female presents after experiencing 30 seconds of dizziness. She feels back to normal now, but was encouraged by her husband to come due to the low blood pressure reading on her home machine. Labs are reassuring. Urinalysis has been requested. Will monitor and await urine specimen with intent to discharge home if all is reassuring. Patient agreeable to the plan.  RN staff notified me that the patient had left after collecting the urine specimen. She did not wait on the results.   Final Clinical Impressions(s) / ED Diagnoses   Final diagnoses:  Dizziness    ED Discharge Orders    None       Victorino Dike, FNP 06/16/18 2056    Harvest Dark, MD 06/16/18 2241

## 2018-06-16 NOTE — Telephone Encounter (Signed)
Pt c/o BP issue: STAT if pt c/o blurred vision, one-sided weakness or slurred speech  1. What are your last 5 BP readings? Today 83/69 pulse 46  2. Are you having any other symptoms (ex. Dizziness, headache, blurred vision, passed out)? Feels like she is going to pass out, has felt that way a couple times today, she is a little dizzy   3. What is your BP issue? BP is low, feels faint

## 2018-06-16 NOTE — ED Notes (Signed)
Pt called out and reported she was ready to go home. Pt is calm and cooperative but reports she does not want to wait for urine results and would rather be updated at home. Pt reporting her symptoms have subsided. PT denies wanting to stay for discharge papers. Ambulatory at this time.

## 2018-06-16 NOTE — ED Provider Notes (Addendum)
-----------------------------------------   6:21 PM on 06/16/2018 -----------------------------------------  Patient seen and evaluated by myself.  Overall the patient appears very well.  Vitals are reassuring including blood pressure.  Patient is eating currently, no distress.  States she feels normal.  Patient has a cardiologist will follow up with Dr. Rockey Situ.  Basic labs are within normal limits.  EKG is reassuring.  Urinalysis pending.  If urinalysis is normal anticipate likely discharge home with cardiology follow-up.  Patient agreeable to this plan of care.  EKG viewed and interpreted by myself shows a sinus rhythm at 89 bpm with a narrow QRS, left axis deviation, slight PR prolongation with a somewhat prolonged QTC, however QT on my evaluation appears to be less than half of the patient's RR interval.  No concerning ST changes.  Patient to follow-up with cardiology.   Harvest Dark, MD 06/16/18 1824  Patient provided urine sample and then stated she was ready to leave.  Did not want wait for paperwork, did not want to wait for results.  Urinalysis negative.   Harvest Dark, MD 06/16/18 2242

## 2018-06-16 NOTE — ED Triage Notes (Signed)
Pt c/o having sudden onset dizziness today and her husband checked her b/p 87/74. Denies any recent illness or injury. Pt is a/ox4 on arrival.

## 2018-06-16 NOTE — Telephone Encounter (Signed)
Call to patient, she reports that she is having hypotension that started at 1:30 pm today.   It was 83/69, HR 46. She is having dizziness and weakness when standing.   She has been drinking gatorade.   BP at 2 pm 87/74, HR 92. She did take her metoprolol last night as she taken once per day in the evening.   After having verbal conversation with Christell Faith, PA, it was advised that she go to ED for further evaluation. Pt agreed with POC.   Pt agreed to follow up with [place].

## 2018-06-18 ENCOUNTER — Telehealth: Payer: Self-pay

## 2018-06-18 NOTE — Telephone Encounter (Signed)
Pt left v/m requesting cb; pts son was tested today for covid; last wk pts son was exposed to someone with + covid. Pt wants to know if she needs to be tested. Advised would schedule virtual visit and then decide if needed covid testing. Pt said that would be fine but she is in a line now and would need to cb. I advised pt we close today at 4pm and if pt could call me back in early AM to schedule virtual visit. Pt voiced understanding and will cb on 06/19/18.

## 2018-06-19 ENCOUNTER — Encounter: Payer: Self-pay | Admitting: Family Medicine

## 2018-06-19 ENCOUNTER — Ambulatory Visit (INDEPENDENT_AMBULATORY_CARE_PROVIDER_SITE_OTHER): Payer: BC Managed Care – PPO | Admitting: Family Medicine

## 2018-06-19 DIAGNOSIS — Z20828 Contact with and (suspected) exposure to other viral communicable diseases: Secondary | ICD-10-CM | POA: Diagnosis not present

## 2018-06-19 DIAGNOSIS — Z20822 Contact with and (suspected) exposure to covid-19: Secondary | ICD-10-CM | POA: Insufficient documentation

## 2018-06-19 NOTE — Telephone Encounter (Signed)
I spoke with pt; and pt scheduled virtual visit with Dr Glori Bickers today at 12 noon. One of pts son was exposed to covid. Pt does not have covid symptoms,no travel and no exposure directly to covid or flu.

## 2018-06-19 NOTE — Assessment & Plan Note (Signed)
Her son who lives in the house was exposed to a person with covid (he is tired but shows no other symptoms)  He was tested yesterday and pending result  Pt wonders if she needs testing also She is completely asymptomatic   inst that she herself does not need to get tested unless she develops symptoms or her son tests positive  In the meantime until his test returns - inst to isolate him in the house as much as possible inst pt to get rest/try not to get run down Wear a mask if near her son  Let us know when his test results return or if she develops any symptoms including respiratory / GI (new)/ or loss of taste and smell /or elevated temp  She voiced understanding

## 2018-06-19 NOTE — Progress Notes (Signed)
Virtual Visit via Video Note  I connected with Lindsay Fry on 06/19/18 at 12:00 PM EDT by a video enabled telemedicine application and verified that I am speaking with the correct person using two identifiers.  Location: Patient: home Provider: office    I discussed the limitations of evaluation and management by telemedicine and the availability of in person appointments. The patient expressed understanding and agreed to proceed.  History of Present Illness: Patient presents with concerns about covid-19 exposure   Her oldest son worked last week and his supervisor found out on Wednesday he is positive for covid -47  He has had a little cough/ a smoker -not different from usual He is more tired also  He is isolating himself within the house   She [facility] her [facility] report (yesterday at 1 pm)   she feels fine  No cough/sob/abd pain/rash/loss of taste or smell/fever   Patient Active Problem List   Diagnosis Date Noted  . Exposure to Covid-19 Virus 06/19/2018  . Thoracic back pain 02/19/2018  . Uncontrolled type 2 diabetes mellitus with hyperglycemia, with long-term current use of insulin ([place]) 02/19/2018  . Pleural thickening 11/05/2017  . Abrasion of right knee 11/05/2017  . Fall 11/05/2017  . Mouth ulcers 11/05/2017  . Cervical radiculopathy due to degenerative joint disease of spine 10/28/2017  . 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 Supraventricular tachycardia ([place]) 04/22/2017  . Osteopenia 09/25/2016  . Estrogen deficiency 05/30/2016  . Need for hepatitis C screening test 03/19/2016  . Screening for HIV (human immunodeficiency virus) 03/19/2016  . Carcinoma of overlapping sites of left breast in female, estrogen receptor positive ([place]) 08/21/2015  . Chronic constipation 01/18/2015  . NSTEMI (non-ST elevated myocardial infarction) ([place]) 09/06/2014  . CAD (coronary artery disease) 09/06/2014  . DM (diabetes mellitus) ([place]) 09/05/2014  .  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 Carcinoma of left breast ([place]) 06/02/2014  . Sinus tachycardia 04/27/2014  . Class 2 severe obesity due to excess calories with serious comorbidity and body mass index (BMI) of 36.0 to 36.9 in adult ([place]) 05/20/2013  . Bladder pain 02/17/2013  . ADD (attention deficit disorder) 11/19/2012  . Urinary frequency 08/05/2012  . Low back pain 08/05/2012  . 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 Chronic sinusitis 01/24/2012  . Vertigo, benign positional 01/24/2012  . Other screening mammogram 12/05/2010  . Routine general medical examination at a health care facility 10/14/2010  . FATIGUE 08/09/2009  . POSTMENOPAUSAL STATUS 06/22/2008  . Vitamin D deficiency 03/18/2008  . BENIGN NEOPLASM [place] SITE DIGESTIVE SYSTEM 01/09/2007  . HIATAL HERNIA 01/09/2007  . COLONIC POLYPS, ADENOMATOUS, HX OF 01/09/2007  . HYPERCHOLESTEROLEMIA 01/07/2007  . Bipolar disorder, unspecified ([place]) 01/07/2007  . Bipolar I disorder, most recent episode depressed ([place]) 01/07/2007  . Essential hypertension 01/07/2007  . ALLERGIC RHINITIS 01/07/2007  . ASTHMA 01/07/2007  . GERD 01/07/2007  . Ulcerative colitis ([place]) 01/07/2007  . ACNE ROSACEA 01/07/2007  . MIGRAINES, HX OF 01/07/2007  . Fibromyalgia 10/30/2006  . INSOMNIA 10/30/2006   Past Medical History:  Diagnosis Date  . ADD (attention deficit disorder) 11/19/2012  . Allergic rhinitis, cause unspecified   . Anxiety state, unspecified   . Arthritis   . Asymptomatic postmenopausal status (age-related) (natural)   . Atypical hyperplasia of left breast 2000  . Benign neoplasm of other and unspecified site of the digestive system   . Bipolar disorder ([place])   . Breast cancer ([place])    Left  Breast 68m invasive CA left uiq, dcis left uoq and a lot ADH, ALH in both breasts.  .Marland KitchenCAD (coronary artery disease)    a. 08/2014 NSTEMI/Cath: LM nl, LAD 50p, D1/D2 min irregs, LCX nl, OM1 40, LPDA nl, RI 99ost  (2.080mvessel->med Rx), RCA nl, AM 60, nl EF; b. 11/2016 MV: EF 74%, no ischemia (performed 2/2 dyspnea & new inf TWI).  . Marland Kitchenarcinoma of left breast (HC[facility]5/19/2016  . Cataract   . Chronic diastolic CHF (congestive heart failure) ([facility]   a. 08/2014 Echo: EF 60-65%, Gr 1 DD, nl LA.  . Marland Kitchenlotting disorder (HCP[facility]   on blood thiner, Plavix  . Cyst    on Achilles tendon  . Depression   . Diabetes mellitus ([facility] [place]   frank  . Dysuria   . Edema   . Family history of malignant neoplasm of gastrointestinal tract   . Fibromyalgia   . GERD (gastroesophageal reflux disease)   . Glaucoma    pre glaucoma, 05/14/2018 pt. denies glaucoma  . Headache    migraines  . Hiatal hernia   . History of alcoholism (HCSavage[place]  . History of migraines   . History of ovarian cyst   . Hx of adenomatous colonic polyps   . Insomnia, unspecified   . Myocardial infarction ([facility] [place]8-21-16  . OCD (obsessive compulsive disorder)   . Osteopenia 09/25/2016   Femoral neck T -1.1  9/18  . Other screening mammogram   . Pure hypercholesterolemia   . Rosacea   . Subacute confusional state 11/19/2012  . Tubular adenoma of colon 01/10/11  . Ulcerative colitis, unspecified   . Unspecified asthma(493.90)   . Unspecified essential hypertension   . Unspecified hypothyroidism   . Unspecified vitamin D deficiency   . Vertigo    Past Surgical History:  Procedure Laterality Date  . APPENDECTOMY    . AXILLARY SENTINEL NODE BIOPSY Left 05/23/2014   Procedure: AXILLARY SENTINEL NODE BIOPSY;  Surgeon: SeChristene LyeMD;  Location: [facility] ORS;  Service: General;  Laterality: Left;  . BREAST BIOPSY  Aug 2000   on tamoxifen, atypical hyperplasia  . BREAST BIOPSY Left 04/2014  . BREAST SURGERY Left Aug 2000   lumpectomy/ Dr CrSharlet Sal[place]. BREAST SURGERY Bilateral 05/23/14   Mastectomy  . BUNIONECTOMY Right   . CARDIAC CATHETERIZATION N/A 09/05/2014   Procedure: Left Heart Cath and Coronary Angiography;  Surgeon: MuWellington HampshireMD;   [facility];  Service: Cardiovascular;  Laterality: N/A;  . CARDIAC CATHETERIZATION  09-05-14  . CATARACT EXTRACTION W/ INTRAOCULAR LENS IMPLANT Bilateral   . CESAREAN SECTION  1996/1997   placenta previa, gest DM, pre-eclampsia  . CHOLECYSTECTOMY  1997   adhesions also  . COLONOSCOPY  01/2001   Ulcerative colitis  . COLONOSCOPY  04/2004   UC, polyp  . COLONOSCOPY  12/08   UC, no polyps  . DEXA  04/1999 and 2010   normal  . ESOPHAGOGASTRODUODENOSCOPY  09/2001   polyp  . exercise stress test  11/2003   negative  . FEMUR FRACTURE SURGERY Left   . MASTECTOMY Bilateral   . MECKEL DIVERTICULUM EXCISION  1999  . NASAL SINUS SURGERY  05/1997  . nuclear stress test  06/2007   negative  . precancerous mole removed    . SIMPLE MASTECTOMY WITH AXILLARY SENTINEL NODE BIOPSY Bilateral 05/23/2014   Procedure: Bilateral simple mastectomy, left sentinel node biopsy ;  Surgeon: SeChristene LyeMD;  Location:  [facility] ORS;  Service: General;  Laterality: Bilateral;  . Sleep study  11/09   no apnea, but did snore (done by [facility])  . TONSILLECTOMY  1984  . TUBAL LIGATION    . WISDOM TOOTH EXTRACTION  1980's   Social History   Tobacco Use  . Smoking status: Former Smoker    Packs/day: 0.25    Years: 5.00    Pack years: 1.25    Types: Cigarettes    Last attempt to quit: 01/14/1993    Years since quitting: 25.4  . Smokeless tobacco: Never Used  Substance Use Topics  . Alcohol use: No    Alcohol/week: 0.0 standard drinks    Comment: Recovered ETOH  . Drug use: No   Family History  Problem Relation Age of Onset  . Coronary artery disease Father   . Colon cancer Father   . Alzheimer's disease Father   . Dementia Father   . Colon polyps Father   . Coronary artery disease Mother   . Hypertension Mother   . Osteoporosis Mother   . Crohn's disease Mother        crohns colitis  . Breast cancer Other        great aunts  . Coronary artery disease Other        Uncle  (also AAA)  . Diabetes Other        remote family history  . Stroke Cousin   . Esophageal cancer Neg Hx   . Rectal cancer Neg Hx   . Stomach cancer Neg Hx    Allergies  Allergen Reactions  . Ephedrine Other (See Comments)    Pt becomes hyper  . Aspartame And Phenylalanine Nausea Only  . Aspirin     REACTION: aggrivates colitis  . Crestor [Rosuvastatin]     REACTION: increased lfts  . Erythromycin     REACTION: GI upset  . Nsaids     REACTION: aggrivate colitis  . Oxycodone Other (See Comments)    Panic Attack   Current Outpatient Medications on File Prior to Visit  Medication Sig Dispense Refill  . acetaminophen (TYLENOL) 650 MG CR tablet Take 650 mg by mouth every 8 (eight) hours as needed for pain.    Marland Kitchen amantadine (SYMMETREL) 100 MG capsule Take 100 mg by mouth 2 (two) times daily.    Marland Kitchen atorvastatin (LIPITOR) 80 MG tablet TAKE 1 TABLET(80 MG) BY MOUTH DAILY 90 tablet 3  . B-D UF III MINI PEN NEEDLES 31G X 5 MM MISC USE FOR INJECTIONS TWICE DAILY 100 each 0  . BAYER MICROLET LANCETS lancets USE AS DIRECTED TO CHECK BLOOD SUGAR TWICE DAILY 200 each 2  . Blood Glucose Monitoring Suppl (CONTOUR NEXT EZ MONITOR) w/Device KIT USE AS DIRECTED TO CHECK BLOOD SUGAR TWICE DAILY 1 kit 2  . Calcium Carb-Cholecalciferol (CALCIUM PLUS VITAMIN D3) 600-800 MG-UNIT TABS Take 1 tablet by mouth daily.    . chlorproMAZINE (THORAZINE) 25 MG tablet Take 25-50 mg by mouth as needed (migraines).     . clopidogrel (PLAVIX) 75 MG tablet TAKE 1 TABLET(75 MG) BY MOUTH DAILY 90 tablet 2  . co-enzyme Q-10 50 MG capsule Take 50 mg by mouth daily.    . Cyanocobalamin (VITAMIN B-12 PO) 3000 mcg-- 2 gummies daily    . diazepam (VALIUM) 10 MG tablet Take 2.5-10 mg by mouth as needed for anxiety.    Marland Kitchen diltiazem (CARDIZEM CD) 240 MG 24 hr capsule TAKE 1 CAPSULE(240 MG) BY MOUTH DAILY 90 capsule  3  . diphenoxylate-atropine (LOMOTIL) 2.5-0.025 MG tablet Take 1-2 tabs daily as needed for diarrhea 50 tablet 0  .  famotidine (PEPCID) 40 MG tablet Take 40 mg by mouth 2 (two) times daily.    Marland Kitchen FIBER SELECT GUMMIES PO Take 3 capsules by mouth daily.    Marland Kitchen gabapentin (NEURONTIN) 300 MG capsule TAKE 1 CAPSULE(300 MG) BY MOUTH THREE TIMES DAILY 90 capsule 5  . glucose blood (BAYER CONTOUR NEXT TEST) test strip USE AS DIRECTED TO CHECK BLOOD SUGAR TWICE DAILY 200 each 2  . Insulin Detemir (LEVEMIR FLEXTOUCH) 100 UNIT/ML Pen Inject 50 Units into the skin at bedtime. INJECT 50 UNITS UNDER THE SKIN DAILY AT 10 PM 5 pen 3  . Insulin Pen Needle (B-D UF III MINI PEN NEEDLES) 31G X 5 MM MISC USE FOR INJECTIONS TWICE DAILY 100 each 0  . INVOKANA 300 MG TABS tablet TAKE 1 TABLET(300 MG) BY MOUTH DAILY BEFORE BREAKFAST 30 tablet 3  . lamoTRIgine (LAMICTAL) 200 MG tablet Take 400 mg by mouth daily.    Marland Kitchen latanoprost (XALATAN) 0.005 % ophthalmic solution Place 1 drop into both eyes at bedtime as needed.     Marland Kitchen letrozole (FEMARA) 2.5 MG tablet TAKE 1 TABLET(2.5 MG) BY MOUTH DAILY 30 tablet 12  . linaclotide (LINZESS) 290 MCG CAPS capsule Take 1 capsule (290 mcg total) by mouth daily before breakfast. 30 capsule 11  . lithium carbonate (ESKALITH) 450 MG CR tablet Take 900 mg by mouth at bedtime.    . Melatonin 10 MG CAPS Take 1 capsule by mouth at bedtime.    . mesalamine (LIALDA) 1.2 g EC tablet Take 2 tablets (2.4 g total) by mouth daily. 60 tablet 11  . metoprolol succinate (TOPROL-XL) 50 MG 24 hr tablet Take 25 mg by mouth daily.   3  . Multiple Vitamin (MULTIVITAMIN) tablet Take 1 tablet by mouth daily.      . Omega-3 Kr[place] Oil 500 MG CAPS Take 500 mg by mouth.    . ondansetron (ZOFRAN-ODT) 4 MG disintegrating tablet Take 1 tablet (4 mg total) by mouth every 6 (six) hours as needed for nausea or vomiting. 90 tablet 1  . OZEMPIC, 1 MG/DOSE, 2 MG/1.5ML SOPN INJECT 1 MG UNDER THE SKIN ONCE A WEEK 6 mL 1  . pantoprazole (PROTONIX) 40 MG tablet TAKE 1 TABLET(40 MG) BY MOUTH TWICE DAILY 60 tablet 11  . Probiotic Product (PROBIOTIC  DAILY PO) Take by mouth.    . QUEtiapine (SEROQUEL XR) 400 MG 24 hr tablet Take 800 mg by mouth at bedtime.    . Simethicone (GAS-X PO) Take 1 tablet by mouth 4 (four) times daily.    . solifenacin (VESICARE) 5 MG tablet Take 1 tablet by mouth daily.    Marland Kitchen SYNTHROID 200 MCG tablet TAKE 1 TABLET BY MOUTH EVERY DAY 30 tablet 0  . triamcinolone (KENALOG) 0.1 % paste Use as directed 1 application in the mouth or throat 2 (two) times daily. To affected areas 5 g 1  . valACYclovir (VALTREX) 1000 MG tablet TAKE 1 TABLET(1000 MG) BY MOUTH DAILY 30 tablet 2  . vitamin C (ASCORBIC ACID) 500 MG tablet Take 250 mg by mouth daily. Takes 2 a day    . VYVANSE 60 MG capsule Take 60 mg by mouth every morning.   0  . zaleplon (SONATA) 10 MG capsule Take 20 mg by mouth at bedtime as needed for sleep.     No current facility-administered medications on file prior  to visit.     Review of Systems  Constitutional: Negative for chills, fever and weight loss.       Baseline chronic fatigue   HENT: Negative for congestion and sore throat.   Eyes: Negative for discharge and redness.  Respiratory: Negative for cough, shortness of breath, wheezing and stridor.   Cardiovascular: Negative for chest pain and palpitations.  Gastrointestinal: Negative for heartburn, nausea and vomiting.       Some diarrhea on /off with colitis  Musculoskeletal: Negative for myalgias.  Skin: Negative for rash.  Neurological: Negative for dizziness, tingling and headaches.  Psychiatric/Behavioral:       Bipolar -stable    Observations/Objective: Patient appears well, in no distress Weight is baseline (obese) No facial swelling or asymmetry Normal voice-not hoarse and no slurred speech No obvious tremor or mobility impairment Moving neck and UEs normally Able to hear the call well  No cough or shortness of breath during interview  Talkative and mentally sharp with no cognitive changes No skin changes on face or neck , no rash or  pallor Affect is normal (not sounding depressed today)   Assessment and Plan: Problem List Items Addressed This Visit      Other   Exposure to Covid-19 Virus    Her son who lives in the house was exposed to a person with covid (he is tired but shows no other symptoms)  He was tested yesterday and pending result  Pt wonders if she needs testing also She is completely asymptomatic   inst that she herself does not need to get tested unless she develops symptoms or her son tests positive  In the meantime until his test returns - inst to isolate him in the house as much as possible inst pt to get rest/try not to get run down Wear a mask if near her son  Let us know when his test results return or if she develops any symptoms including respiratory / GI (new)/ or loss of taste and smell /or elevated temp  She voiced understanding          Follow Up Instructions: Isolate your son in one room and bathroom in your house until his test results return   Watch yourself for any uri/GI symptoms or fever or changes in taste/smell Alert [place] if any asap   Please let us know if your son's test returns positive    I discussed the assessment and treatment plan with the patient. The patient was provided an opportunity to ask questions and all were answered. The patient agreed with the plan and demonstrated an understanding of the instructions.   The patient was advised to call back or seek an in-person evaluation if the symptoms worsen or if the condition fails to improve as anticipated.  I provided 10 minutes of non-face-to-face time during this encounter.   [name], MD

## 2018-06-19 NOTE — Telephone Encounter (Signed)
She was seen/note in chart

## 2018-06-19 NOTE — Patient Instructions (Signed)
Isolate your son in one room and bathroom in your house until his test results return   Watch yourself for any uri/GI symptoms or fever or changes in taste/smell Alert [place] if any asap   Please let us know if your son's test returns positive

## 2018-06-23 ENCOUNTER — Telehealth: Payer: Self-pay

## 2018-06-28 ENCOUNTER — Other Ambulatory Visit: Payer: Self-pay | Admitting: Endocrinology

## 2018-06-29 NOTE — Telephone Encounter (Signed)
Patient called back to give an update.  She stated that she did not have to be tested for COVID. Her son's Test came back negative   BEST PHONE NUMBER - [phone]

## 2018-06-29 NOTE — Telephone Encounter (Signed)
Per Covid-19 Protocol we need to f/u with pt.  Called pt and no answer so left VM also requesting pt to call the office back

## 2018-06-30 ENCOUNTER — Ambulatory Visit (INDEPENDENT_AMBULATORY_CARE_PROVIDER_SITE_OTHER): Payer: BC Managed Care – PPO | Admitting: Psychology

## 2018-06-30 DIAGNOSIS — F3131 Bipolar disorder, current episode depressed, mild: Secondary | ICD-10-CM | POA: Diagnosis not present

## 2018-07-15 DIAGNOSIS — M542 Cervicalgia: Secondary | ICD-10-CM | POA: Diagnosis not present

## 2018-07-28 ENCOUNTER — Ambulatory Visit (INDEPENDENT_AMBULATORY_CARE_PROVIDER_SITE_OTHER): Payer: BC Managed Care – PPO | Admitting: Psychology

## 2018-07-28 DIAGNOSIS — F3131 Bipolar disorder, current episode depressed, mild: Secondary | ICD-10-CM

## 2018-07-29 DIAGNOSIS — M542 Cervicalgia: Secondary | ICD-10-CM | POA: Diagnosis not present

## 2018-08-03 ENCOUNTER — Other Ambulatory Visit: Payer: Self-pay | Admitting: Endocrinology

## 2018-08-03 DIAGNOSIS — M542 Cervicalgia: Secondary | ICD-10-CM | POA: Diagnosis not present

## 2018-08-06 ENCOUNTER — Ambulatory Visit: Payer: Self-pay | Admitting: Endocrinology

## 2018-08-10 DIAGNOSIS — M542 Cervicalgia: Secondary | ICD-10-CM | POA: Diagnosis not present

## 2018-08-15 ENCOUNTER — Other Ambulatory Visit: Payer: Self-pay | Admitting: Family Medicine

## 2018-08-17 NOTE — Telephone Encounter (Signed)
Last OV was 06/19/18 for covid exposure, last filled on 05/01/18 #30 tabs with 2 refills, please advise

## 2018-08-17 NOTE — Telephone Encounter (Signed)
She should be able to get by with 500 mg daily -does that work for her? If so I will change px

## 2018-08-18 NOTE — Telephone Encounter (Signed)
Called pt and no answer and pt's VM box is full so couldn't leave VM

## 2018-08-19 NOTE — Telephone Encounter (Signed)
Called both numbers and not able to leave a message

## 2018-08-19 NOTE — Telephone Encounter (Signed)
Pt called back, unable to reach cma. She said she needs 1063m. She has tried the 5043mand she said it did not work for her. Please send to WaMeriel Pica

## 2018-08-21 ENCOUNTER — Other Ambulatory Visit: Payer: Self-pay | Admitting: Endocrinology

## 2018-08-25 ENCOUNTER — Telehealth: Payer: Self-pay

## 2018-08-25 ENCOUNTER — Ambulatory Visit: Payer: BC Managed Care – PPO | Admitting: Endocrinology

## 2018-08-25 NOTE — Telephone Encounter (Signed)
Linzess approved through Cover My Meds 08/10/2018

## 2018-08-26 ENCOUNTER — Ambulatory Visit (INDEPENDENT_AMBULATORY_CARE_PROVIDER_SITE_OTHER): Payer: BC Managed Care – PPO | Admitting: Psychology

## 2018-08-26 DIAGNOSIS — F3131 Bipolar disorder, current episode depressed, mild: Secondary | ICD-10-CM

## 2018-08-27 ENCOUNTER — Other Ambulatory Visit: Payer: Self-pay

## 2018-08-28 ENCOUNTER — Other Ambulatory Visit: Payer: Self-pay

## 2018-08-28 ENCOUNTER — Inpatient Hospital Stay: Payer: BC Managed Care – PPO | Attending: Internal Medicine

## 2018-08-28 ENCOUNTER — Inpatient Hospital Stay (HOSPITAL_BASED_OUTPATIENT_CLINIC_OR_DEPARTMENT_OTHER): Payer: BC Managed Care – PPO | Admitting: Internal Medicine

## 2018-08-28 ENCOUNTER — Encounter: Payer: Self-pay | Admitting: Internal Medicine

## 2018-08-28 DIAGNOSIS — C50812 Malignant neoplasm of overlapping sites of left female breast: Secondary | ICD-10-CM | POA: Diagnosis not present

## 2018-08-28 DIAGNOSIS — E559 Vitamin D deficiency, unspecified: Secondary | ICD-10-CM | POA: Insufficient documentation

## 2018-08-28 DIAGNOSIS — I252 Old myocardial infarction: Secondary | ICD-10-CM | POA: Diagnosis not present

## 2018-08-28 DIAGNOSIS — Z8262 Family history of osteoporosis: Secondary | ICD-10-CM | POA: Insufficient documentation

## 2018-08-28 DIAGNOSIS — Z79899 Other long term (current) drug therapy: Secondary | ICD-10-CM | POA: Insufficient documentation

## 2018-08-28 DIAGNOSIS — Z823 Family history of stroke: Secondary | ICD-10-CM | POA: Insufficient documentation

## 2018-08-28 DIAGNOSIS — F419 Anxiety disorder, unspecified: Secondary | ICD-10-CM | POA: Diagnosis not present

## 2018-08-28 DIAGNOSIS — K5190 Ulcerative colitis, unspecified, without complications: Secondary | ICD-10-CM | POA: Insufficient documentation

## 2018-08-28 DIAGNOSIS — R5383 Other fatigue: Secondary | ICD-10-CM | POA: Diagnosis not present

## 2018-08-28 DIAGNOSIS — R232 Flushing: Secondary | ICD-10-CM | POA: Diagnosis not present

## 2018-08-28 DIAGNOSIS — Z886 Allergy status to analgesic agent status: Secondary | ICD-10-CM | POA: Insufficient documentation

## 2018-08-28 DIAGNOSIS — Z7902 Long term (current) use of antithrombotics/antiplatelets: Secondary | ICD-10-CM | POA: Insufficient documentation

## 2018-08-28 DIAGNOSIS — Z8249 Family history of ischemic heart disease and other diseases of the circulatory system: Secondary | ICD-10-CM | POA: Insufficient documentation

## 2018-08-28 DIAGNOSIS — Z79811 Long term (current) use of aromatase inhibitors: Secondary | ICD-10-CM | POA: Insufficient documentation

## 2018-08-28 DIAGNOSIS — Z803 Family history of malignant neoplasm of breast: Secondary | ICD-10-CM | POA: Insufficient documentation

## 2018-08-28 DIAGNOSIS — Z170 Estrogen receptor positive status [ER+]: Secondary | ICD-10-CM | POA: Diagnosis not present

## 2018-08-28 DIAGNOSIS — M1990 Unspecified osteoarthritis, unspecified site: Secondary | ICD-10-CM | POA: Insufficient documentation

## 2018-08-28 DIAGNOSIS — Z881 Allergy status to other antibiotic agents status: Secondary | ICD-10-CM | POA: Insufficient documentation

## 2018-08-28 DIAGNOSIS — F319 Bipolar disorder, unspecified: Secondary | ICD-10-CM | POA: Insufficient documentation

## 2018-08-28 DIAGNOSIS — Z820 Family history of epilepsy and other diseases of the nervous system: Secondary | ICD-10-CM | POA: Insufficient documentation

## 2018-08-28 DIAGNOSIS — Z9013 Acquired absence of bilateral breasts and nipples: Secondary | ICD-10-CM | POA: Diagnosis not present

## 2018-08-28 DIAGNOSIS — E1136 Type 2 diabetes mellitus with diabetic cataract: Secondary | ICD-10-CM | POA: Insufficient documentation

## 2018-08-28 DIAGNOSIS — Z87891 Personal history of nicotine dependence: Secondary | ICD-10-CM | POA: Insufficient documentation

## 2018-08-28 DIAGNOSIS — Z833 Family history of diabetes mellitus: Secondary | ICD-10-CM | POA: Insufficient documentation

## 2018-08-28 DIAGNOSIS — Z8371 Family history of colonic polyps: Secondary | ICD-10-CM | POA: Insufficient documentation

## 2018-08-28 DIAGNOSIS — Z885 Allergy status to narcotic agent status: Secondary | ICD-10-CM | POA: Insufficient documentation

## 2018-08-28 DIAGNOSIS — R7989 Other specified abnormal findings of blood chemistry: Secondary | ICD-10-CM | POA: Diagnosis not present

## 2018-08-28 DIAGNOSIS — M8580 Other specified disorders of bone density and structure, unspecified site: Secondary | ICD-10-CM | POA: Insufficient documentation

## 2018-08-28 DIAGNOSIS — Z794 Long term (current) use of insulin: Secondary | ICD-10-CM | POA: Diagnosis not present

## 2018-08-28 DIAGNOSIS — Z800 Family history of malignant neoplasm of digestive organs: Secondary | ICD-10-CM | POA: Insufficient documentation

## 2018-08-28 LAB — CBC WITH DIFFERENTIAL/PLATELET
Abs Immature Granulocytes: 0.02 10*3/uL (ref 0.00–0.07)
Basophils Absolute: 0.1 10*3/uL (ref 0.0–0.1)
Basophils Relative: 1 %
Eosinophils Absolute: 0.3 10*3/uL (ref 0.0–0.5)
Eosinophils Relative: 3 %
HCT: 47.7 % — ABNORMAL HIGH (ref 36.0–46.0)
Hemoglobin: 15.4 g/dL — ABNORMAL HIGH (ref 12.0–15.0)
Immature Granulocytes: 0 %
Lymphocytes Relative: 19 %
Lymphs Abs: 1.8 10*3/uL (ref 0.7–4.0)
MCH: 31 pg (ref 26.0–34.0)
MCHC: 32.3 g/dL (ref 30.0–36.0)
MCV: 96 fL (ref 80.0–100.0)
Monocytes Absolute: 0.7 10*3/uL (ref 0.1–1.0)
Monocytes Relative: 7 %
Neutro Abs: 6.7 10*3/uL (ref 1.7–7.7)
Neutrophils Relative %: 70 %
Platelets: 352 10*3/uL (ref 150–400)
RBC: 4.97 MIL/uL (ref 3.87–5.11)
RDW: 13.6 % (ref 11.5–15.5)
WBC: 9.5 10*3/uL (ref 4.0–10.5)
nRBC: 0 % (ref 0.0–0.2)

## 2018-08-28 LAB — COMPREHENSIVE METABOLIC PANEL
ALT: 50 U/L — ABNORMAL HIGH (ref 0–44)
AST: 46 U/L — ABNORMAL HIGH (ref 15–41)
Albumin: 4.8 g/dL (ref 3.5–5.0)
Alkaline Phosphatase: 111 U/L (ref 38–126)
Anion gap: 11 (ref 5–15)
BUN: 17 mg/dL (ref 6–20)
CO2: 22 mmol/L (ref 22–32)
Calcium: 10.2 mg/dL (ref 8.9–10.3)
Chloride: 107 mmol/L (ref 98–111)
Creatinine, Ser: 0.94 mg/dL (ref 0.44–1.00)
GFR calc Af Amer: >60 mL/min (ref >60)
GFR calc non Af Amer: >60 mL/min (ref >60)
Glucose, Bld: 191 mg/dL — ABNORMAL HIGH (ref 70–99)
Potassium: 4 mmol/L (ref 3.5–5.1)
Sodium: 140 mmol/L (ref 135–145)
Total Bilirubin: 0.7 mg/dL (ref 0.3–1.2)
Total Protein: 7.9 g/dL (ref 6.5–8.1)

## 2018-08-28 NOTE — Progress Notes (Signed)
She is currently doing PT for back pain

## 2018-08-28 NOTE — Progress Notes (Signed)
[facility] OFFICE PROGRESS NOTE  Patient Care Team: Tower, Wynelle Fanny, MD as PCP - General Rockey Situ, Kathlene November, MD as PCP - Cardiology (Cardiology) Elayne Snare, MD as Consulting Physician (Endocrinology) Chucky May, MD as Consulting Physician (Psychiatry) Laurey Morale, Wonda Cheng, MD (Obstetrics and Gynecology) Christene Lye, MD (General Surgery) Minna Merritts, MD as Consulting Physician (Cardiology)  Cancer Staging Carcinoma of left breast [facility]) Staging form: Breast, AJCC 7th Edition - Clinical: Stage IA (T1b, N0, M0) - Unsigned Staging comments: Kidney frequent changes in the 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  No invasive cancer in the right breast.  Status post bilateral mastectomy    Oncology History Overview Note  1.  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  2, April of 2016 patient had  stereotactic biopsy of abnormal breast lesion in the left breastwhich was positive for invasive carcinoma and ductal carcinoma in situ.  Patient underwent bilateral mastectomy  May 9 , 2016  Patient has invasive carcinoma and left breast with significant changes in the right breast ductal carcinoma in situ patient underwent bilateral mastectomy.  Left breast: T1 b N0 M0 stage IB estrogen and progesterone receptor HER-2/neu- Not overexpressed  2.    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  female pleasant patient above history of stage I breast cancer ER PR positive HER-2 negative currently on letrozole is here for follow-up.  Patient needs another colonoscopy.  Diarrhea stable.  Denies any unusual joint pains or worsening of chronic back pain.  Chronically hot flashes.  Not any worse.  Symptoms of anxiety/depression stable followed regularly.  No new lumps or bumps. He Review of Systems  Constitutional: Positive for malaise/fatigue. Negative for chills, diaphoresis, fever and weight loss.  HENT: Negative for nosebleeds and sore throat.   Eyes: Negative for double vision.  Respiratory: Negative for cough, hemoptysis, sputum production, shortness of breath and wheezing.   Cardiovascular: Negative for chest pain, palpitations, orthopnea and leg swelling.  Gastrointestinal: Negative for abdominal pain, blood in stool, constipation, heartburn, melena, nausea and vomiting.  Genitourinary: Negative for dysuria, frequency and urgency.  Musculoskeletal: Positive for back pain and joint pain.  Skin: Negative.  Negative for itching and rash.  Neurological: Negative for dizziness, tingling, focal weakness, weakness and headaches.  Endo/Heme/Allergies: Does not bruise/bleed easily.  Psychiatric/Behavioral: Positive for depression. The patient is nervous/anxious and has insomnia.       PAST MEDICAL HISTORY :  Past Medical History:  Diagnosis Date  . ADD (attention deficit disorder) 11/19/2012  . Allergic rhinitis, cause unspecified   . Anxiety state, unspecified   . Arthritis   . Asymptomatic postmenopausal status (age-related) (natural)   . Atypical hyperplasia of left breast 2000  . Benign neoplasm of other and unspecified site of the digestive system   .  Bipolar disorder ([place])   . Breast cancer ([place])    Left Breast 60m invasive CA left uiq,  dcis left uoq and a lot ADH, ALH in both breasts.  .Marland KitchenCAD (coronary artery disease)    a. 08/2014 NSTEMI/Cath: LM nl, LAD 50p, D1/D2 min irregs, LCX nl, OM1 40, LPDA nl, RI 99ost (2.022mvessel->med Rx), RCA nl, AM 60, nl EF; b. 11/2016 MV: EF 74%, no ischemia (performed 2/2 dyspnea & new inf TWI).  . Marland Kitchenarcinoma of left breast ([place]CR[place]5/19/2016  . Cataract   . Chronic diastolic CHF (congestive heart failure) ([place]   a. 08/2014 Echo: EF 60-65%, Gr 1 DD, nl LA.  . Marland Kitchenlotting disorder ([place]CR[place]   on blood thiner, Plavix  . Cyst    on Achilles tendon  . Depression   . Diabetes mellitus ([place]   frank  . Dysuria   . Edema   . Family history of malignant neoplasm of gastrointestinal tract   . Fibromyalgia   . GERD (gastroesophageal reflux disease)   . Glaucoma    pre glaucoma, 05/14/2018 pt. denies glaucoma  . Headache    migraines  . Hiatal hernia   . History of alcoholism (HC[place]  . History of migraines   . History of ovarian cyst   . Hx of adenomatous colonic polyps   . Insomnia, unspecified   . Myocardial infarction (HCBuxton8-21-16  . OCD (obsessive compulsive disorder)   . Osteopenia 09/25/2016   Femoral neck T -1.1  9/18  . Other screening mammogram   . Pure hypercholesterolemia   . Rosacea   . Subacute confusional state 11/19/2012  . Tubular adenoma of colon 01/10/11  . Ulcerative colitis, unspecified   . Unspecified asthma(493.90)   . Unspecified essential hypertension   . Unspecified hypothyroidism   . Unspecified vitamin D deficiency   . Vertigo     PAST SURGICAL HISTORY :   Past Surgical History:  Procedure Laterality Date  . APPENDECTOMY    . AXILLARY SENTINEL NODE BIOPSY Left 05/23/2014   Procedure: AXILLARY SENTINEL NODE BIOPSY;  Surgeon: SeChristene LyeMD;  Location: [facility];  Service: General;  Laterality: Left;  . BREAST BIOPSY  Aug 2000   on tamoxifen, atypical hyperplasia  . BREAST BIOPSY Left 04/2014  . BREAST SURGERY Left Aug 2000   lumpectomy/ Dr CrSharlet Salina . BREAST SURGERY Bilateral 05/23/14   Mastectomy  . BUNIONECTOMY Right   . CARDIAC CATHETERIZATION N/A 09/05/2014   Procedure: Left Heart Cath and Coronary Angiography;  Surgeon: MuWellington HampshireMD;  [facility];  Service: Cardiovascular;  Laterality: N/A;  . CARDIAC CATHETERIZATION  09-05-14  . CATARACT EXTRACTION W/ INTRAOCULAR LENS IMPLANT Bilateral   . CESAREAN SECTION  1996/1997   placenta previa, gest DM, pre-eclampsia  . CHOLECYSTECTOMY  1997   adhesions also  . COLONOSCOPY  01/2001   Ulcerative colitis  . COLONOSCOPY  04/2004   UC, polyp  . COLONOSCOPY  12/08   UC, no polyps  . DEXA  04/1999 and 2010   normal  . ESOPHAGOGASTRODUODENOSCOPY  09/2001   polyp  . exercise stress test  11/2003   negative  . FEMUR FRACTURE SURGERY Left   . MASTECTOMY Bilateral   . MECKEL DIVERTICULUM EXCISION  1999  . NASAL SINUS SURGERY  05/1997  . nuclear stress test  06/2007   negative  . precancerous mole removed    . SIMPLE MASTECTOMY WITH AXILLARY SENTINEL NODE BIOPSY Bilateral  05/23/2014   Procedure: Bilateral simple mastectomy, left sentinel node biopsy ;  Surgeon: Christene Lye, MD;  Location: [facility] ORS;  Service: General;  Laterality: Bilateral;  . Sleep study  11/09   no apnea, but did snore (done by [facility])  . TONSILLECTOMY  1984  . TUBAL LIGATION    . WISDOM TOOTH EXTRACTION  1980's    FAMILY HISTORY :   Family History  Problem Relation Age of Onset  . Coronary artery disease Father   . Colon cancer Father   . Alzheimer's disease Father   . Dementia Father   . Colon polyps Father   . Coronary artery disease Mother   . Hypertension Mother   . Osteoporosis Mother   . Crohn's disease Mother        crohns colitis  . Breast cancer Other        great aunts  . Coronary artery disease Other        Uncle (also AAA)  . Diabetes Other        remote family history  . Stroke Cousin   . Esophageal cancer Neg Hx   . Rectal cancer Neg Hx   . Stomach  cancer Neg Hx     SOCIAL HISTORY:   Social History   Tobacco Use  . Smoking status: Former Smoker    Packs/day: 0.25    Years: 5.00    Pack years: 1.25    Types: Cigarettes    Quit date: 01/14/1993    Years since quitting: 25.6  . Smokeless tobacco: Never Used  Substance Use Topics  . Alcohol use: No    Alcohol/week: 0.0 standard drinks    Comment: Recovered ETOH  . Drug use: No    ALLERGIES:  is allergic to ephedrine; aspartame and phenylalanine; aspirin; crestor [rosuvastatin]; erythromycin; nsaids; and oxycodone.  MEDICATIONS:  Current Outpatient Medications  Medication Sig Dispense Refill  . acetaminophen (TYLENOL) 650 MG CR tablet Take 650 mg by mouth every 8 (eight) hours as needed for pain.    Marland Kitchen amantadine (SYMMETREL) 100 MG capsule Take 100 mg by mouth 2 (two) times daily.    Marland Kitchen atorvastatin (LIPITOR) 80 MG tablet TAKE 1 TABLET(80 MG) BY MOUTH DAILY 90 tablet 3  . Calcium Carb-Cholecalciferol (CALCIUM PLUS VITAMIN D3) 600-800 MG-UNIT TABS Take 1 tablet by mouth daily.    . clopidogrel (PLAVIX) 75 MG tablet TAKE 1 TABLET(75 MG) BY MOUTH DAILY 90 tablet 2  . co-enzyme Q-10 50 MG capsule Take 50 mg by mouth daily.    . Cyanocobalamin (VITAMIN B-12 PO) 3000 mcg-- 2 gummies daily    . diazepam (VALIUM) 10 MG tablet Take 2.5-10 mg by mouth as needed for anxiety.    Marland Kitchen diltiazem (CARDIZEM CD) 240 MG 24 hr capsule TAKE 1 CAPSULE(240 MG) BY MOUTH DAILY 90 capsule 3  . diphenoxylate-atropine (LOMOTIL) 2.5-0.025 MG tablet Take 1-2 tabs daily as needed for diarrhea 50 tablet 0  . famotidine (PEPCID) 40 MG tablet Take 40 mg by mouth 2 (two) times daily.    Marland Kitchen FIBER SELECT GUMMIES PO Take 3 capsules by mouth daily.    Marland Kitchen gabapentin (NEURONTIN) 300 MG capsule TAKE 1 CAPSULE(300 MG) BY MOUTH THREE TIMES DAILY 90 capsule 5  . glucose blood (BAYER CONTOUR NEXT TEST) test strip USE AS DIRECTED TO CHECK BLOOD SUGAR TWICE DAILY 200 each 2  . Insulin Detemir (LEVEMIR FLEXTOUCH) 100 UNIT/ML Pen  Inject 50 Units into the skin at bedtime. INJECT 50 UNITS UNDER  THE SKIN DAILY AT 10 PM 5 pen 3  . Insulin Pen Needle (B-D UF III MINI PEN NEEDLES) 31G X 5 MM MISC USE FOR INJECTIONS TWICE DAILY 100 each 0  . INVOKANA 300 MG TABS tablet TAKE 1 TABLET(300 MG) BY MOUTH DAILY BEFORE BREAKFAST 30 tablet 3  . lamoTRIgine (LAMICTAL) 200 MG tablet Take 400 mg by mouth daily.    Marland Kitchen letrozole (FEMARA) 2.5 MG tablet TAKE 1 TABLET(2.5 MG) BY MOUTH DAILY 30 tablet 12  . linaclotide (LINZESS) 290 MCG CAPS capsule Take 1 capsule (290 mcg total) by mouth daily before breakfast. 30 capsule 11  . lithium carbonate (ESKALITH) 450 MG CR tablet Take 900 mg by mouth at bedtime.    . Melatonin 10 MG CAPS Take 1 capsule by mouth at bedtime.    . mesalamine (LIALDA) 1.2 g EC tablet Take 2 tablets (2.4 g total) by mouth daily. 60 tablet 11  . metoprolol succinate (TOPROL-XL) 50 MG 24 hr tablet Take 25 mg by mouth daily.   3  . Multiple Vitamin (MULTIVITAMIN) tablet Take 1 tablet by mouth daily.      . Omega-3 Krill Oil 500 MG CAPS Take 500 mg by mouth.    . ondansetron (ZOFRAN-ODT) 4 MG disintegrating tablet Take 1 tablet (4 mg total) by mouth every 6 (six) hours as needed for nausea or vomiting. 90 tablet 1  . OZEMPIC, 1 MG/DOSE, 2 MG/1.5ML SOPN INJECT 1 MG UNDER THE SKIN ONCE A WEEK 6 mL 1  . pantoprazole (PROTONIX) 40 MG tablet TAKE 1 TABLET(40 MG) BY MOUTH TWICE DAILY 60 tablet 11  . Probiotic Product (PROBIOTIC DAILY PO) Take by mouth.    . QUEtiapine (SEROQUEL XR) 400 MG 24 hr tablet Take 800 mg by mouth at bedtime.    . Simethicone (GAS-X PO) Take 1 tablet by mouth 4 (four) times daily.    . solifenacin (VESICARE) 5 MG tablet Take 1 tablet by mouth daily.    Marland Kitchen SYNTHROID 200 MCG tablet Take 1 tablet (200 mcg total) by mouth daily. 30 tablet 3  . triamcinolone (KENALOG) 0.1 % paste Use as directed 1 application in the mouth or throat 2 (two) times daily. To affected areas 5 g 1  . valACYclovir (VALTREX) 1000 MG  tablet TAKE 1 TABLET(1000 MG) BY MOUTH DAILY 30 tablet 5  . vitamin C (ASCORBIC ACID) 500 MG tablet Take 250 mg by mouth daily. Takes 2 a day    . VYVANSE 60 MG capsule Take 60 mg by mouth every morning.   0  . zaleplon (SONATA) 10 MG capsule Take 20 mg by mouth at bedtime as needed for sleep.    . B-D UF III MINI PEN NEEDLES 31G X 5 MM MISC USE FOR INJECTIONS TWICE DAILY 100 each 0  . BAYER MICROLET LANCETS lancets USE AS DIRECTED TO CHECK BLOOD SUGAR TWICE DAILY 200 each 2  . Blood Glucose Monitoring Suppl (CONTOUR NEXT EZ MONITOR) w/Device KIT USE AS DIRECTED TO CHECK BLOOD SUGAR TWICE DAILY 1 kit 2  . chlorproMAZINE (THORAZINE) 25 MG tablet Take 25-50 mg by mouth as needed (migraines).     . latanoprost (XALATAN) 0.005 % ophthalmic solution Place 1 drop into both eyes at bedtime as needed.      No current facility-administered medications for this visit.     PHYSICAL EXAMINATION: ECOG PERFORMANCE STATUS: 0 - Asymptomatic  BP 121/86   Pulse (!) 106   Temp 97.6 F (36.4 C)   Resp 20  Wt 210 lb (95.3 kg)   LMP 08/23/2006 (Approximate) Comment: tubal ligation  BMI 33.89 kg/m   Filed Weights   08/28/18 1449  Weight: 210 lb (95.3 kg)    Physical Exam  Constitutional: She is oriented to person, place, and time and well-developed, well-nourished, and in no distress.  HENT:  Head: Normocephalic and atraumatic.  Mouth/Throat: Oropharynx is clear and moist. No oropharyngeal exudate.  Eyes: Pupils are equal, round, and reactive to light.  Neck: Normal range of motion. Neck supple.  Cardiovascular: Normal rate and regular rhythm.  Pulmonary/Chest: No respiratory distress. She has no wheezes.  Abdominal: Soft. Bowel sounds are normal. She exhibits no distension and no mass. There is no abdominal tenderness. There is no rebound and no guarding.  Musculoskeletal: Normal range of motion.        General: No tenderness or edema.  Neurological: She is alert and oriented to person, place,  and time.  Skin: Skin is warm.  Bilateral mastectomy noted.  No lumps or bumps.  Psychiatric: Affect normal.       LABORATORY DATA:  I have reviewed the data as listed    Component Value Date/Time   NA 140 08/28/2018 1422   K 4.0 08/28/2018 1422   CL 107 08/28/2018 1422   CO2 22 08/28/2018 1422   GLUCOSE 191 (H) 08/28/2018 1422   BUN 17 08/28/2018 1422   CREATININE 0.94 08/28/2018 1422   CALCIUM 10.2 08/28/2018 1422   PROT 7.9 08/28/2018 1422   ALBUMIN 4.8 08/28/2018 1422   AST 46 (H) 08/28/2018 1422   ALT 50 (H) 08/28/2018 1422   ALKPHOS 111 08/28/2018 1422   BILITOT 0.7 08/28/2018 1422   GFRNONAA >60 08/28/2018 1422   GFRAA >60 08/28/2018 1422    No results found for: SPEP, UPEP  Lab Results  Component Value Date   WBC 9.5 08/28/2018   NEUTROABS 6.7 08/28/2018   HGB 15.4 (H) 08/28/2018   HCT 47.7 (H) 08/28/2018   MCV 96.0 08/28/2018   PLT 352 08/28/2018      Chemistry      Component Value Date/Time   NA 140 08/28/2018 1422   K 4.0 08/28/2018 1422   CL 107 08/28/2018 1422   CO2 22 08/28/2018 1422   BUN 17 08/28/2018 1422   CREATININE 0.94 08/28/2018 1422      Component Value Date/Time   CALCIUM 10.2 08/28/2018 1422   ALKPHOS 111 08/28/2018 1422   AST 46 (H) 08/28/2018 1422   ALT 50 (H) 08/28/2018 1422   BILITOT 0.7 08/28/2018 1422       RADIOGRAPHIC STUDIES: I have personally reviewed the radiological images as listed and agreed with the findings in the report. No results found.   ASSESSMENT & PLAN:  Carcinoma of overlapping sites of left breast in female, estrogen receptor positive ([place]) # LEFT Breast Stage I s/p mastec [prophy right mastec] on Low risk mammoprint- on Letrozle. NED [until summer 2021].  Clinically stable.  Tumor marker is normal.  # Ulcerative colitis-colo-may 2020; stable  # Arthritis- sec to AI.  Stable  # BMD- Sep 2018- osteopenia. Con ca+vit D.  Stable  # mild elevated LFts- ? Lipitor/faty liver- exercise; stable  #  DISPOSITION: # follow up in 6 monthsMD/labs-cbc/cmpDr.B   Dr. Levonne Spiller- Pscychiatrist; [place].    No orders of the defined types were placed in this encounter.  All questions were answered. The patient knows to call the clinic with any problems, questions or concerns.  Cammie Sickle, MD 08/30/2018 5:48 PM

## 2018-08-28 NOTE — Assessment & Plan Note (Addendum)
#   LEFT Breast Stage I s/p mastec [prophy right mastec] on Low risk mammoprint- on Letrozle. NED [until summer 2021].  Clinically stable.  Tumor marker is normal.  # Ulcerative colitis-colo-may 2020; stable  # Arthritis- sec to AI.  Stable  # BMD- Sep 2018- osteopenia. Con ca+vit D.  Stable  # mild elevated LFts- ? Lipitor/faty liver- exercise; stable  # DISPOSITION: # follow up in 6 monthsMD/labs-cbc/cmpDr.B   Dr. Levonne Spiller- Pscychiatrist; [place].

## 2018-08-31 DIAGNOSIS — M542 Cervicalgia: Secondary | ICD-10-CM | POA: Diagnosis not present

## 2018-09-02 DIAGNOSIS — M542 Cervicalgia: Secondary | ICD-10-CM | POA: Diagnosis not present

## 2018-09-04 ENCOUNTER — Other Ambulatory Visit: Payer: Self-pay | Admitting: Endocrinology

## 2018-09-04 NOTE — Telephone Encounter (Signed)
She has an appointment now

## 2018-09-04 NOTE — Telephone Encounter (Signed)
Patient has not been seen since march. Refill or deny?

## 2018-09-07 ENCOUNTER — Other Ambulatory Visit: Payer: Self-pay

## 2018-09-07 MED ORDER — SYNTHROID 200 MCG PO TABS: ORAL_TABLET | ORAL | 3 refills | Status: DC

## 2018-09-11 DIAGNOSIS — M542 Cervicalgia: Secondary | ICD-10-CM | POA: Diagnosis not present

## 2018-09-11 NOTE — Telephone Encounter (Signed)
FYI

## 2018-09-15 ENCOUNTER — Ambulatory Visit: Payer: BC Managed Care – PPO | Admitting: Endocrinology

## 2018-09-17 DIAGNOSIS — M542 Cervicalgia: Secondary | ICD-10-CM | POA: Diagnosis not present

## 2018-09-24 ENCOUNTER — Ambulatory Visit: Payer: BC Managed Care – PPO | Admitting: Psychology

## 2018-09-28 ENCOUNTER — Other Ambulatory Visit (INDEPENDENT_AMBULATORY_CARE_PROVIDER_SITE_OTHER): Payer: BC Managed Care – PPO

## 2018-09-28 DIAGNOSIS — E559 Vitamin D deficiency, unspecified: Secondary | ICD-10-CM

## 2018-09-28 DIAGNOSIS — E890 Postprocedural hypothyroidism: Secondary | ICD-10-CM

## 2018-09-28 DIAGNOSIS — E1165 Type 2 diabetes mellitus with hyperglycemia: Secondary | ICD-10-CM | POA: Diagnosis not present

## 2018-09-28 DIAGNOSIS — Z794 Long term (current) use of insulin: Secondary | ICD-10-CM | POA: Diagnosis not present

## 2018-09-28 LAB — LIPID PANEL
Cholesterol: 151 mg/dL (ref 0–200)
HDL: 45.4 mg/dL (ref >39.00)
LDL Cholesterol: 79 mg/dL (ref 0–99)
NonHDL: 105.65
Total CHOL/HDL Ratio: 3
Triglycerides: 135 mg/dL (ref 0.0–149.0)
VLDL: 27 mg/dL (ref 0.0–40.0)

## 2018-09-28 LAB — COMPREHENSIVE METABOLIC PANEL
ALT: 41 U/L — ABNORMAL HIGH (ref 0–35)
AST: 45 U/L — ABNORMAL HIGH (ref 0–37)
Albumin: 4.5 g/dL (ref 3.5–5.2)
Alkaline Phosphatase: 98 U/L (ref 39–117)
BUN: 14 mg/dL (ref 6–23)
CO2: 28 mEq/L (ref 19–32)
Calcium: 10.3 mg/dL (ref 8.4–10.5)
Chloride: 105 mEq/L (ref 96–112)
Creatinine, Ser: 0.84 mg/dL (ref 0.40–1.20)
GFR: 69.12 mL/min (ref >60.00)
Glucose, Bld: 147 mg/dL — ABNORMAL HIGH (ref 70–99)
Potassium: 4 mEq/L (ref 3.5–5.1)
Sodium: 142 mEq/L (ref 135–145)
Total Bilirubin: 0.5 mg/dL (ref 0.2–1.2)
Total Protein: 6.5 g/dL (ref 6.0–8.3)

## 2018-09-28 LAB — HEMOGLOBIN A1C: Hgb A1c MFr Bld: 6.5 % (ref 4.6–6.5)

## 2018-09-28 LAB — VITAMIN D 25 HYDROXY (VIT D DEFICIENCY, FRACTURES): VITD: 38.76 ng/mL (ref 30.00–100.00)

## 2018-09-28 LAB — T4, FREE: Free T4: 0.71 ng/dL (ref 0.60–1.60)

## 2018-09-28 LAB — TSH: TSH: 14.49 u[IU]/mL — ABNORMAL HIGH (ref 0.35–4.50)

## 2018-10-01 ENCOUNTER — Other Ambulatory Visit: Payer: Self-pay | Admitting: Endocrinology

## 2018-10-06 ENCOUNTER — Ambulatory Visit: Payer: BC Managed Care – PPO | Admitting: Endocrinology

## 2018-10-06 ENCOUNTER — Encounter: Payer: Self-pay | Admitting: Endocrinology

## 2018-10-06 ENCOUNTER — Other Ambulatory Visit: Payer: Self-pay

## 2018-10-06 VITALS — BP 140/82 | HR 96 | Ht 66.0 in | Wt 212.2 lb

## 2018-10-06 DIAGNOSIS — E890 Postprocedural hypothyroidism: Secondary | ICD-10-CM

## 2018-10-06 DIAGNOSIS — E559 Vitamin D deficiency, unspecified: Secondary | ICD-10-CM | POA: Diagnosis not present

## 2018-10-06 DIAGNOSIS — E7800 Pure hypercholesterolemia, unspecified: Secondary | ICD-10-CM | POA: Diagnosis not present

## 2018-10-06 DIAGNOSIS — E119 Type 2 diabetes mellitus without complications: Secondary | ICD-10-CM

## 2018-10-06 NOTE — Progress Notes (Signed)
Patient ID: ABRI VACCA, female   DOB: 06-18-1960, 58 y.o.   MRN: [id number]   Reason for Appointment:  Hypothyroidism and diabetes, followup visit  History of Present Illness:  DIABETES:  Prior history: With her high A1c of 6.7 in 03/2014 she had an abnormal glucose tolerance test indicating diabetes and 1 hour glucose of 300 She was started on metformin; however even with 1500 mg of metformin ER  blood sugars were not controlled especially fasting Subsequently with taking Janumet XR 100/1000 daily her blood sugars were much better Because of her weight gain and A1c going up to 7.3 along with occasional readings over 200 she was started on Victoza in 11/16 She has had progressive hyperglycemia previously and A1c was up to 8.6% in 4/17  She was started on Levemir insulin in 08/2015 because of progressive rise in blood sugars and inability to control with 3 other agents   Non-insulin hypoglycemic drugs: Ozempic 1 mg weekly; metformin ER 2000 mg daily, Invokana 300 mg daily  INSULIN regimen: Levemir 52 units at bedtime  A1c is 6.5 compared to 6.2  Current management, blood sugar patterns and problems identified:  She has not been seen in follow-up since 03/2018  Overall blood sugars are about the same at home although relatively higher fasting  She has increased her insulin only 2 units when she was having higher fasting readings  However she has inconsistent diet with relatively higher fat meals at times with hamburgers and cheese  Also will drink most drinks containing sugar as she cannot tolerate artificial sweeteners  Surprisingly her weight has not gone up  Because of the pandemic she has not been able to exercise and only recently has gone to the pool for exercise about once a week  She does take her medications including Ozempic and insulin consistently  Renal function stable with Invokana    GLUCOSE readings from review of the CONTOUR monitor download  and averages for 30 days:   PRE-MEAL Fasting Lunch Dinner Bedtime Overall  Glucose range:  109-197   116    Mean/median: 135    136   POST-MEAL PC Breakfast PC Lunch PC Dinner  Glucose range:   143  146  Mean/median:      Previous readings:  PRE-MEAL Fasting Lunch Dinner Bedtime Overall  Glucose range: 101-136  113     Mean/median: 120    132   POST-MEAL PC Breakfast PC Lunch PC Dinner  Glucose range:  200   180, 162  Mean/median:    171    Weight history:  Wt Readings from Last 3 Encounters:  10/06/18 212 lb 3.2 oz (96.3 kg)  08/28/18 210 lb (95.3 kg)  06/16/18 213 lb (96.6 kg)   Previous consultation with dietitian: none   LABS:  Lab Results  Component Value Date   HGBA1C 6.5 09/28/2018   HGBA1C 6.2 03/31/2018   HGBA1C 6.9 (H) 11/24/2017   Lab Results  Component Value Date   MICROALBUR 2.2 (H) 03/31/2018   [place] 79 09/28/2018   CREATININE 0.84 09/28/2018   HYPOTHYROIDISM: This was first diagnosed in 1992 after her treatment for Graves' disease with I-131 She has been on relatively large doses of thyroxine supplements She was on 224 mcg since 11/13 and her TSH was normal in 3/14 Subjectively difficult to assess her thyroid since she tends to have fatigue chronically.  In 2014 her dose was reduced to 200 mcg daily and her dose has been fluctuating since then  She had required periodic increase in dosage including in 05/2014 when her TSH was 20  She is  on 200 g daily, on the brand name Synthroid  She has some fatigue and at times somnolence, mostly related to other medical problems including sleep disturbances and bipolar illness. No recent change in symptoms or weight  She has been quite regular with the Synthroid every day before breakfast and has not missed any doses         TSH which was consistently normal is now relatively high at 14.5  Lab Results  Component Value Date   TSH 14.49 (H) 09/28/2018   TSH 1.66 03/31/2018   TSH 0.71 11/24/2017    FREET4 0.71 09/28/2018   FREET4 0.69 03/31/2018   FREET4 0.77 07/24/2017     No visits with results within 1 Week(s) from this visit.  Latest known visit with results is:  Lab on 09/28/2018  Component Date Value Ref Range Status  . VITD 09/28/2018 38.76  30.00 - 100.00 ng/mL Final  . Free T4 09/28/2018 0.71  0.60 - 1.60 ng/dL Final   Comment: Specimens from patients who are undergoing biotin therapy and /or ingesting biotin supplements may contain high levels of biotin.  The higher biotin concentration in these specimens interferes with this Free T4 assay.  Specimens that contain high levels  of biotin may cause false high results for this Free T4 assay.  Please interpret results in light of the total clinical presentation of the patient.    Marland Kitchen TSH 09/28/2018 14.49* 0.35 - 4.50 uIU/mL Final  . Cholesterol 09/28/2018 151  0 - 200 mg/dL Final   ATP III Classification       Desirable:  < 200 mg/dL               Borderline High:  200 - 239 mg/dL          High:  > = 240 mg/dL  . Triglycerides 09/28/2018 135.0  0.0 - 149.0 mg/dL Final   Normal:  <150 mg/dLBorderline High:  150 - 199 mg/dL  . HDL 09/28/2018 45.40  >39.00 mg/dL Final  . VLDL 09/28/2018 27.0  0.0 - 40.0 mg/dL Final  . LDL Cholesterol 09/28/2018 79  0 - 99 mg/dL Final  . Total CHOL/HDL Ratio 09/28/2018 3   Final                  Men          Women1/2 Average Risk     3.4          3.3Average Risk          5.0          4.42X Average Risk          9.6          7.13X Average Risk          15.0          11.0                      . NonHDL 09/28/2018 105.65   Final   NOTE:  Non-HDL goal should be 30 mg/dL higher than patient's LDL goal (i.e. LDL goal of < 70 mg/dL, would have non-HDL goal of < 100 mg/dL)  . Sodium 09/28/2018 142  135 - 145 mEq/L Final  . Potassium 09/28/2018 4.0  3.5 - 5.1 mEq/L Final  . Chloride 09/28/2018 105  96 - 112 mEq/L Final  .  CO2 09/28/2018 28  19 - 32 mEq/L Final  . Glucose, Bld 09/28/2018 147* 70 - 99 mg/dL  Final  . BUN 09/28/2018 14  6 - 23 mg/dL Final  . Creatinine, Ser 09/28/2018 0.84  0.40 - 1.20 mg/dL Final  . Total Bilirubin 09/28/2018 0.5  0.2 - 1.2 mg/dL Final  . Alkaline Phosphatase 09/28/2018 98  39 - 117 U/L Final  . AST 09/28/2018 45* 0 - 37 U/L Final  . ALT 09/28/2018 41* 0 - 35 U/L Final  . Total Protein 09/28/2018 6.5  6.0 - 8.3 g/dL Final  . Albumin 09/28/2018 4.5  3.5 - 5.2 g/dL Final  . Calcium 09/28/2018 10.3  8.4 - 10.5 mg/dL Final  . GFR 09/28/2018 69.12  >60.00 mL/min Final  . Hgb A1c MFr Bld 09/28/2018 6.5  4.6 - 6.5 % Final   Glycemic Control Guidelines for People with Diabetes:Non Diabetic:  <6%Goal of Therapy: <7%Additional Action Suggested:  >8%     Allergies as of 10/06/2018      Reactions   Ephedrine Other (See Comments)   Pt becomes hyper   Aspartame And Phenylalanine Nausea Only   Aspirin    REACTION: aggrivates colitis   Crestor [rosuvastatin]    REACTION: increased lfts   Erythromycin    REACTION: GI upset   Nsaids    REACTION: aggrivate colitis   Oxycodone Other (See Comments)   Panic Attack      Medication List       Accurate as of October 06, 2018  2:19 PM. If you have any questions, ask your nurse or doctor.        acetaminophen 650 MG CR tablet Commonly known as: TYLENOL Take 650 mg by mouth every 8 (eight) hours as needed for pain.   amantadine 100 MG capsule Commonly known as: SYMMETREL Take 100 mg by mouth 2 (two) times daily.   atorvastatin 80 MG tablet Commonly known as: LIPITOR TAKE 1 TABLET(80 MG) BY MOUTH DAILY   Bayer Microlet Lancets lancets USE AS DIRECTED TO CHECK BLOOD SUGAR TWICE DAILY   Calcium Plus Vitamin D3 600-800 MG-UNIT Tabs Generic drug: Calcium Carb-Cholecalciferol Take 1 tablet by mouth daily.   chlorproMAZINE 25 MG tablet Commonly known as: THORAZINE Take 25-50 mg by mouth as needed (migraines).   clopidogrel 75 MG tablet Commonly known as: PLAVIX TAKE 1 TABLET(75 MG) BY MOUTH DAILY    co-enzyme Q-10 50 MG capsule Take 50 mg by mouth daily.   CONTOUR NEXT EZ MONITOR w/Device Kit USE AS DIRECTED TO CHECK BLOOD SUGAR TWICE DAILY   diazepam 10 MG tablet Commonly known as: VALIUM Take 2.5-10 mg by mouth as needed for anxiety.   diltiazem 240 MG 24 hr capsule Commonly known as: CARDIZEM CD TAKE 1 CAPSULE(240 MG) BY MOUTH DAILY   diphenoxylate-atropine 2.5-0.025 MG tablet Commonly known as: Lomotil Take 1-2 tabs daily as needed for diarrhea   famotidine 40 MG tablet Commonly known as: PEPCID Take 40 mg by mouth 2 (two) times daily.   FIBER SELECT GUMMIES PO Take 3 capsules by mouth daily.   gabapentin 300 MG capsule Commonly known as: NEURONTIN TAKE 1 CAPSULE(300 MG) BY MOUTH THREE TIMES DAILY   GAS-X PO Take 1 tablet by mouth 4 (four) times daily.   glucose blood test strip Commonly known as: Visual merchandiser Next Test USE AS DIRECTED TO CHECK BLOOD SUGAR TWICE DAILY   Insulin Pen Needle 31G X 5 MM Misc Commonly known as: B-D UF III MINI PEN  NEEDLES USE FOR INJECTIONS TWICE DAILY   B-D UF III MINI PEN NEEDLES 31G X 5 MM Misc Generic drug: Insulin Pen Needle USE FOR INJECTIONS TWICE DAILY   Invokana 300 MG Tabs tablet Generic drug: canagliflozin TAKE 1 TABLET(300 MG) BY MOUTH DAILY BEFORE BREAKFAST   lamoTRIgine 200 MG tablet Commonly known as: LAMICTAL Take 400 mg by mouth daily.   latanoprost 0.005 % ophthalmic solution Commonly known as: XALATAN Place 1 drop into both eyes at bedtime as needed.   letrozole 2.5 MG tablet Commonly known as: FEMARA TAKE 1 TABLET(2.5 MG) BY MOUTH DAILY   Levemir FlexTouch 100 UNIT/ML Pen Generic drug: Insulin Detemir Inject 50 units under the skin once daily at night. May reduce by 4 units if fast BS is below 100.   linaclotide 290 MCG Caps capsule Commonly known as: LINZESS Take 1 capsule (290 mcg total) by mouth daily before breakfast.   lithium carbonate 450 MG CR tablet Commonly known as: ESKALITH  Take 900 mg by mouth at bedtime.   Melatonin 10 MG Caps Take 1 capsule by mouth at bedtime.   mesalamine 1.2 g EC tablet Commonly known as: LIALDA Take 2 tablets (2.4 g total) by mouth daily.   metoprolol succinate 50 MG 24 hr tablet Commonly known as: TOPROL-XL Take 25 mg by mouth daily.   multivitamin tablet Take 1 tablet by mouth daily.   Omega-3 Krill Oil 500 MG Caps Take 500 mg by mouth.   ondansetron 4 MG disintegrating tablet Commonly known as: ZOFRAN-ODT Take 1 tablet (4 mg total) by mouth every 6 (six) hours as needed for nausea or vomiting.   Ozempic (1 MG/DOSE) 2 MG/1.5ML Sopn Generic drug: Semaglutide (1 MG/DOSE) INJECT 1 MG UNDER THE SKIN ONCE A WEEK   pantoprazole 40 MG tablet Commonly known as: PROTONIX TAKE 1 TABLET(40 MG) BY MOUTH TWICE DAILY   PROBIOTIC DAILY PO Take by mouth.   QUEtiapine 400 MG 24 hr tablet Commonly known as: SEROQUEL XR Take 800 mg by mouth at bedtime.   solifenacin 5 MG tablet Commonly known as: VESICARE Take 1 tablet by mouth daily.   Synthroid 200 MCG tablet Generic drug: levothyroxine TAKE 1 TABLET(200 MCG) BY MOUTH DAILY BEFORE BREAKFAST   triamcinolone 0.1 % paste Commonly known as: KENALOG Use as directed 1 application in the mouth or throat 2 (two) times daily. To affected areas   valACYclovir 1000 MG tablet Commonly known as: VALTREX TAKE 1 TABLET(1000 MG) BY MOUTH DAILY   VITAMIN B-12 PO 3000 mcg-- 2 gummies daily   vitamin C 500 MG tablet Commonly known as: ASCORBIC ACID Take 250 mg by mouth daily. Takes 2 a day   Vyvanse 60 MG capsule Generic drug: lisdexamfetamine Take 60 mg by mouth every morning.   zaleplon 10 MG capsule Commonly known as: SONATA Take 20 mg by mouth at bedtime as needed for sleep.       Allergies:  Allergies  Allergen Reactions  . Ephedrine Other (See Comments)    Pt becomes hyper  . Aspartame And Phenylalanine Nausea Only  . Aspirin     REACTION: aggrivates colitis   . Crestor [Rosuvastatin]     REACTION: increased lfts  . Erythromycin     REACTION: GI upset  . Nsaids     REACTION: aggrivate colitis  . Oxycodone Other (See Comments)    Panic Attack    Past Medical History:  Diagnosis Date  . ADD (attention deficit disorder) 11/19/2012  . Allergic rhinitis, cause  unspecified   . Anxiety state, unspecified   . Arthritis   . Asymptomatic postmenopausal status (age-related) (natural)   . Atypical hyperplasia of left breast 2000  . Benign neoplasm of other and unspecified site of the digestive system   . Bipolar disorder ([place])   . Breast cancer ([place])    Left Breast 55m invasive CA left uiq, dcis left uoq and a lot ADH, ALH in both breasts.  .Marland KitchenCAD (coronary artery disease)    a. 08/2014 NSTEMI/Cath: LM nl, LAD 50p, D1/D2 min irregs, LCX nl, OM1 40, LPDA nl, RI 99ost (2.050mvessel->med Rx), RCA nl, AM 60, nl EF; b. 11/2016 MV: EF 74%, no ischemia (performed 2/2 dyspnea & new inf TWI).  . Marland Kitchenarcinoma of left breast ([facility]/19/2016  . Cataract   . Chronic diastolic CHF (congestive heart failure) ([facility]   a. 08/2014 Echo: EF 60-65%, Gr 1 DD, nl LA.  . Marland Kitchenlotting disorder ([facility]   on blood thiner, Plavix  . Cyst    on Achilles tendon  . Depression   . Diabetes mellitus ([facility]   frank  . Dysuria   . Edema   . Family history of malignant neoplasm of gastrointestinal tract   . Fibromyalgia   . GERD (gastroesophageal reflux disease)   . Glaucoma    pre glaucoma, 05/14/2018 pt. denies glaucoma  . Headache    migraines  . Hiatal hernia   . History of alcoholism ([facility]ir  . History of migraines   . History of ovarian cyst   . Hx of adenomatous colonic polyps   . Insomnia, unspecified   . Myocardial infarction ([facility]8-21-16  . OCD (obsessive compulsive disorder)   . Osteopenia 09/25/2016   Femoral neck T -1.1  9/18  . Other screening mammogram   . Pure hypercholesterolemia   . Rosacea   . Subacute confusional state 11/19/2012  . Tubular adenoma of  colon 01/10/11  . Ulcerative colitis, unspecified   . Unspecified asthma(493.90)   . Unspecified essential hypertension   . Unspecified hypothyroidism   . Unspecified vitamin D deficiency   . Vertigo     Past Surgical History:  Procedure Laterality Date  . APPENDECTOMY    . AXILLARY SENTINEL NODE BIOPSY Left 05/23/2014   Procedure: AXILLARY SENTINEL NODE BIOPSY;  Surgeon: SeChristene LyeMD;  Location: [facility] ORS;  Service: General;  Laterality: Left;  . BREAST BIOPSY  Aug 2000   on tamoxifen, atypical hyperplasia  . BREAST BIOPSY Left 04/2014  . BREAST SURGERY Left Aug 2000   lumpectomy/ Dr CrSharlet [place]. BREAST SURGERY Bilateral 05/23/14   Mastectomy  . BUNIONECTOMY Right   . CARDIAC CATHETERIZATION N/A 09/05/2014   Procedure: Left Heart Cath and Coronary Angiography;  Surgeon: MuWellington HampshireMD;  [facility];  Service: Cardiovascular;  Laterality: N/A;  . CARDIAC CATHETERIZATION  09-05-14  . CATARACT EXTRACTION W/ INTRAOCULAR LENS IMPLANT Bilateral   . CESAREAN SECTION  1996/1997   placenta previa, gest DM, pre-eclampsia  . CHOLECYSTECTOMY  1997   adhesions also  . COLONOSCOPY  01/2001   Ulcerative colitis  . COLONOSCOPY  04/2004   UC, polyp  . COLONOSCOPY  12/08   UC, no polyps  . DEXA  04/1999 and 2010   normal  . ESOPHAGOGASTRODUODENOSCOPY  09/2001   polyp  . exercise stress test  11/2003   negative  . FEMUR FRACTURE SURGERY Left   . MASTECTOMY Bilateral   . MECKEL DIVERTICULUM  EXCISION  1999  . NASAL SINUS SURGERY  05/1997  . nuclear stress test  06/2007   negative  . precancerous mole removed    . SIMPLE MASTECTOMY WITH AXILLARY SENTINEL NODE BIOPSY Bilateral 05/23/2014   Procedure: Bilateral simple mastectomy, left sentinel node biopsy ;  Surgeon: Christene Lye, MD;  Location: [facility] ORS;  Service: General;  Laterality: Bilateral;  . Sleep study  11/09   no apnea, but did snore (done by [facility])  . TONSILLECTOMY  1984  . TUBAL LIGATION     . WISDOM TOOTH EXTRACTION  1980's    Family History  Problem Relation Age of Onset  . Coronary artery disease Father   . Colon cancer Father   . Alzheimer's disease Father   . Dementia Father   . Colon polyps Father   . Coronary artery disease Mother   . Hypertension Mother   . Osteoporosis Mother   . Crohn's disease Mother        crohns colitis  . Breast cancer Other        great aunts  . Coronary artery disease Other        Uncle (also AAA)  . Diabetes Other        remote family history  . Stroke Cousin   . Esophageal cancer Neg Hx   . Rectal cancer Neg Hx   . Stomach cancer Neg Hx     Social History:  reports that she quit smoking about 25 years ago. Her smoking use included cigarettes. She has a 1.25 pack-year smoking history. She has never used smokeless tobacco. She reports that she does not drink alcohol or use drugs.  REVIEW Of SYSTEMS:   HYPERCALCEMIA: Her calcium has been previously high, upper normal subsequently PTH in the past has been borderline for hyperparathyroidism and is last below 20  She is taking OTC vitamin D3, ? 2000 units and has been on calcium by her oncologist   Bone density in 9/15 shows T score -1.1 at the hip  Lab Results  Component Value Date   PTH 18 10/25/2016   CALCIUM 10.3 09/28/2018   PHOS 2.3 01/29/2007     Lab Results  Component Value Date   CALCIUM 10.3 09/28/2018   PHOS 2.3 01/29/2007   Lab Results  Component Value Date   VD25OH 38.76 09/28/2018   VD25OH 24.75 (L) 02/06/2017   VD25OH 44 05/12/2013     HYPERTENSION: Blood pressure is controlled, managed by cardiologist.   on Bystolic twice a day and diltiazem and also on Invokana  Lab Results  Component Value Date   CREATININE 0.84 09/28/2018   BUN 14 09/28/2018   NA 142 09/28/2018   K 4.0 09/28/2018   CL 105 09/28/2018   CO2 28 09/28/2018    History of hypercholesterolemia: Management by cardiologist and has been on 80 mg total since her MI LDL above  70  She thinks she is regular with her atorvastatin   eating more hamburgers and not always watching saturated fat  Lab Results  Component Value Date   CHOL 151 09/28/2018   HDL 45.40 09/28/2018   LDLCALC 79 09/28/2018   LDLDIRECT 67.0 11/10/2015   TRIG 135.0 09/28/2018   CHOLHDL 3 09/28/2018   Possible fatty liver: Liver functions are slightly higher again   Lab Results  Component Value Date   ALT 41 (H) 09/28/2018      Examination:   BP 140/82 (BP Location: Right Arm, Patient Position: Sitting, Cuff Size:  Normal)   Pulse 96   Ht '5\' 6"'$  (1.676 m)   Wt 212 lb 3.2 oz (96.3 kg)   LMP 08/23/2006 (Approximate) Comment: tubal ligation  SpO2 95%   BMI 34.25 kg/m       Assessments   DIABETES:  See history of present illness for evaluation of  current management, blood sugar patterns and problems identified  Her A1c is 6.5  Blood sugars are reasonably well controlled with highest reading 197 However variability in her sugar is related to diet both with foods and drinks containing sugar Has not done any readings after meals regularly Also not able to exercise as much with her water exercises recently  For now she will continue the same regimen but monitor glucose more often She will try to increase her exercise Also cut back on amounts of sweet drinks and switch to more low fat meals  Hypothyroidism, post ablative:  TSH is significantly above normal with 200 mcg levothyroxine Although difficult to know if she is symptomatic she will need to increase her dose by 1 tablet weekly which will give her an average of 228 mcg daily She will call follow-up with labs in 6 weeks  LIPIDS: LDL is higher than the target of 70 and may be partly related to diet Have again given her list of high saturated fat foods Her cardiologist may want to consider Zetia if LDL in addition   Vitamin D deficiency: Adequately replaced but not clear what dose she is taking, to continue the same   Abnormal liver functions: She will continue to follow with her gastroenterologist   There are no Patient Instructions on file for this visit.  Total visit time for evaluation and management of multiple problems and counseling =25 minutes  .  Elayne Snare 10/06/2018, 2:19 PM   Note: This office note was prepared with Dragon voice recognition system technology. Any transcriptional errors that result from this process are unintentional.

## 2018-10-06 NOTE — Patient Instructions (Signed)
Take extra 1/2 Synthroid on Wednesday and Sat.  Get Labs in 1st week November

## 2018-10-14 NOTE — Telephone Encounter (Signed)
Please review pt request. According to your notes, you ordered the following:  increase her dose by 1 tablet weekly which will give her an average of 228 mcg daily  This is completely different from what she is asking. I want to ensure I am sending in the correct Rx and responding to her correctly.

## 2018-10-15 ENCOUNTER — Other Ambulatory Visit: Payer: Self-pay

## 2018-10-15 MED ORDER — SYNTHROID 200 MCG PO TABS: ORAL_TABLET | ORAL | 2 refills | Status: DC

## 2018-10-15 NOTE — Telephone Encounter (Signed)
SYNTHROID 200 MCG tablet 34 tablet 2 10/15/2018    Sig: Take 1 tablet by mouth x 6 days and 2 tablets by mouth on day 7   Sent to pharmacy as: SYNTHROID 200 MCG tablet   Notes to Pharmacy: Please dispense name brand Synthroid.   E-Prescribing Status: Receipt confirmed by pharmacy (10/15/2018 8:05 AM EDT)

## 2018-10-15 NOTE — Telephone Encounter (Signed)
LVM requesting returned call 

## 2018-10-19 ENCOUNTER — Ambulatory Visit: Payer: BC Managed Care – PPO | Admitting: Psychology

## 2018-10-21 ENCOUNTER — Telehealth: Payer: Self-pay | Admitting: Cardiovascular Disease

## 2018-10-21 NOTE — Telephone Encounter (Signed)
Pt c/o of Chest Pain: STAT if CP now or developed within 24 hours  1. Are you having CP right now?  No but intermittent throughout day increase with exertion   2. Are you experiencing any other symptoms (ex. SOB, nausea, vomiting, sweating)?  No   3. How long have you been experiencing CP? Sunday   4. Is your CP continuous or coming and going? Intermittent   5. Have you taken Nitroglycerin? No   Patient says she is under a lot of stress with cost o husbands recent surgery and just carried a bunch of groceries that she is not used to doing as well

## 2018-10-21 NOTE — Telephone Encounter (Signed)
Virtual Visit Pre-Appointment Phone Call  "(Name), I am calling you today to discuss your upcoming appointment. We are currently trying to limit exposure to the virus that causes COVID-19 by seeing patients at home rather than in the office."  1. "What is the BEST phone number to call the day of the visit?" - include this in appointment notes  2. Do you have or have access to (through a family member/friend) a smartphone with video capability that we can use for your visit?" a. If yes - list this number in appt notes as cell (if different from BEST phone #) and list the appointment type as a VIDEO visit in appointment notes b. If no - list the appointment type as a PHONE visit in appointment notes  3. Confirm consent - "In the setting of the current Covid19 crisis, you are scheduled for a (phone or video) visit with your provider on (date) at (time).  Just as we do with many in-office visits, in order for you to participate in this visit, we must obtain consent.  If you'd like, I can send this to your mychart (if signed up) or email for you to review.  Otherwise, I can obtain your verbal consent now.  All virtual visits are billed to your insurance company just like a normal visit would be.  By agreeing to a virtual visit, we'd like you to understand that the technology does not allow for your provider to perform an examination, and thus may limit your provider's ability to fully assess your condition. If your provider identifies any concerns that need to be evaluated in person, we will make arrangements to do so.  Finally, though the technology is pretty good, we cannot assure that it will always work on either your or our end, and in the setting of a video visit, we may have to convert it to a phone-only visit.  In either situation, we cannot ensure that we have a secure connection.  Are you willing to proceed?" STAFF: Did the patient verbally acknowledge consent to telehealth visit? Document  YES/NO here: YES  4. Advise patient to be prepared - "Two hours prior to your appointment, go ahead and check your blood pressure, pulse, oxygen saturation, and your weight (if you have the equipment to check those) and write them all down. When your visit starts, your provider will ask you for this information. If you have an Apple Watch or Kardia device, please plan to have heart rate information ready on the day of your appointment. Please have a pen and paper handy nearby the day of the visit as well."  5. Give patient instructions for MyChart download to smartphone OR Doximity/Doxy.me as below if video visit (depending on what platform provider is using)  6. Inform patient they will receive a phone call 15 minutes prior to their appointment time (may be from unknown caller ID) so they should be prepared to answer    TELEPHONE CALL NOTE  Lindsay Fry has been deemed a candidate for a follow-up tele-health visit to limit community exposure during the Covid-19 pandemic. I spoke with the patient via phone to ensure availability of phone/video source, confirm preferred email & phone number, and discuss instructions and expectations.  I reminded Lindsay Fry to be prepared with any vital sign and/or heart rhythm information that could potentially be obtained via home monitoring, at the time of her visit. I reminded Lindsay Fry to expect a phone call prior to  her visit.  [name] 10/21/2018 4:59 PM   INSTRUCTIONS FOR DOWNLOADING THE MYCHART APP TO SMARTPHONE  - The patient must first make sure to have activated MyChart and know their login information - If Apple, go to [employer] and type in MyChart in the search bar and download the app. If Android, ask patient to go to [employer] and type in [facility] in the search bar and download the app. The app is free but as with any other app downloads, their phone may require them to verify saved payment information or  Apple/Android password.  - The patient will need to then log into the app with their MyChart username and password, and select [facility] as their healthcare provider to link the account. When it is time for your visit, go to the MyChart app, find appointments, and click Begin Video Visit. Be sure to Select Allow for your device to access the Microphone and Camera for your visit. You will then be connected, and your provider will be with you shortly.  **If they have any issues connecting, or need assistance please contact MyChart service desk [phone])**  **If using a computer, in order to ensure the best quality for their visit they will need to use either of the following Internet Browsers: [employer], or Google Chrome**  IF USING DOXIMITY or DOXY.ME - The patient will receive a link just prior to their visit by text.     FULL LENGTH CONSENT FOR TELE-HEALTH VISIT   I hereby voluntarily request, consent and authorize [facility] and its employed or contracted physicians, physician assistants, nurse practitioners or other licensed health care professionals (the Practitioner), to provide me with telemedicine health care services (the Services") as deemed necessary by the treating Practitioner. I acknowledge and consent to receive the Services by the Practitioner via telemedicine. I understand that the telemedicine visit will involve communicating with the Practitioner through live audiovisual communication technology and the disclosure of certain medical information by electronic transmission. I acknowledge that I have been given the opportunity to request an in-person assessment or other available alternative prior to the telemedicine visit and am voluntarily participating in the telemedicine visit.  I understand that I have the right to withhold or withdraw my consent to the use of telemedicine in the course of my care at any time, without affecting my right to future care  or treatment, and that the Practitioner or I may terminate the telemedicine visit at any time. I understand that I have the right to inspect all information obtained and/or recorded in the course of the telemedicine visit and may receive copies of available information for a reasonable fee.  I understand that some of the potential risks of receiving the Services via telemedicine include:   Delay or interruption in medical evaluation due to technological equipment failure or disruption;  Information transmitted may not be sufficient (e.g. poor resolution of images) to allow for appropriate medical decision making by the Practitioner; and/or   In rare instances, security protocols could fail, causing a breach of personal health information.  Furthermore, I acknowledge that it is my responsibility to provide information about my medical history, conditions and care that is complete and accurate to the best of my ability. I acknowledge that Practitioner's advice, recommendations, and/or decision may be based on factors not within their control, such as incomplete or inaccurate data provided by me or distortions of diagnostic images or specimens that may result from electronic transmissions. I  understand that the practice of medicine is not an exact science and that Practitioner makes no warranties or guarantees regarding treatment outcomes. I acknowledge that I will receive a copy of this consent concurrently upon execution via email to the email address I last provided but may also request a printed copy by calling the office of [facility].    I understand that my insurance will be billed for this visit.   I have read or had this consent read to me.  I understand the contents of this consent, which adequately explains the benefits and risks of the Services being provided via telemedicine.   I have been provided ample opportunity to ask questions regarding this consent and the Services and have had  my questions answered to my satisfaction.  I give my informed consent for the services to be provided through the use of telemedicine in my medical care  By participating in this telemedicine visit I agree to the above.

## 2018-10-21 NOTE — Telephone Encounter (Signed)
Attempted to call the patient. No answer- I left a message to please call back.  

## 2018-10-22 NOTE — Telephone Encounter (Signed)
Returned call to patient.   Pt reports substernal chest discomfort lasting 10-20 sec that has been on and off since last week after carrying groceries into house. Pt reports hx of fibromyalgia, felt like body, chest pains were related. Felt better on Sunday. Denies SOB, radiating or accelerating pain, no diaphoresis.   Monday husband had surgery, felt stressed and had several episodes of chest discomfort return. Last episode yesterday and denies any pain today. Pt reports pain feels different than when she had a heart attack. No medications taken.   Pt has appt scheduled with Dr. Rockey Situ 10/14 @ 10:40A. She agrees to call back if chest pain returns, sx worsen or new sx occur.

## 2018-10-23 DIAGNOSIS — F3174 Bipolar disorder, in full remission, most recent episode manic: Secondary | ICD-10-CM | POA: Diagnosis not present

## 2018-10-23 DIAGNOSIS — F3176 Bipolar disorder, in full remission, most recent episode depressed: Secondary | ICD-10-CM | POA: Diagnosis not present

## 2018-10-24 NOTE — Telephone Encounter (Signed)
Nothing else needed, will see her in clinic

## 2018-10-28 ENCOUNTER — Other Ambulatory Visit: Payer: Self-pay

## 2018-10-28 ENCOUNTER — Telehealth (INDEPENDENT_AMBULATORY_CARE_PROVIDER_SITE_OTHER): Payer: BC Managed Care – PPO | Admitting: Cardiovascular Disease

## 2018-10-28 VITALS — Ht 67.0 in | Wt 207.0 lb

## 2018-10-28 DIAGNOSIS — I25118 Atherosclerotic heart disease of native coronary artery with other forms of angina pectoris: Secondary | ICD-10-CM

## 2018-10-28 DIAGNOSIS — E1159 Type 2 diabetes mellitus with other circulatory complications: Secondary | ICD-10-CM | POA: Diagnosis not present

## 2018-10-28 DIAGNOSIS — R000 Tachycardia, unspecified: Secondary | ICD-10-CM | POA: Diagnosis not present

## 2018-10-28 MED ORDER — DILTIAZEM HCL 30 MG PO TABS: ORAL_TABLET | ORAL | 1 refills | Status: DC

## 2018-10-28 MED ORDER — NITROGLYCERIN 0.4 MG SL SUBL: 0.4000 mg | SUBLINGUAL_TABLET | SUBLINGUAL | 3 refills | Status: DC | PRN

## 2018-10-28 NOTE — Patient Instructions (Addendum)
Medication Instructions:  - Your physician has recommended you make the following change in your medication:   1) Take diltiazem 30 mg as needed up to three times a day for blood pressure >150 (top number)  2) nitroglycerin as been refilled   If you need a refill on your cardiac medications before your next appointment, please call your pharmacy.   Lab work: No new labs needed   If you have labs (blood work) drawn today and your tests are completely normal, you will receive your results only by: Marland Kitchen MyChart Message (if you have MyChart) OR . A paper copy in the mail If you have any lab test that is abnormal or we need to change your treatment, we will call you to review the results.   Testing/Procedures: No new testing needed   Follow-Up: At [facility], you and your health needs are our priority.  As part of our continuing mission to provide you with exceptional heart care, we have created designated Provider Care Teams.  These Care Teams include your primary Cardiologist (physician) and Advanced Practice Providers (APPs -  Physician Assistants and Nurse Practitioners) who all work together to provide you with the care you need, when you need it.  . You will need a follow up appointment in 6 months (April 2021) .   Please call our office 2 months in advance to schedule this appointment.  (Call in early February 2021 to schedule)  . Providers on your designated Care Team:   . Murray Hodgkins, NP . Christell Faith, PA-C . Marrianne Mood, PA-C  Any Other Special Instructions Will Be Listed Below (If Applicable).  For educational health videos Log in to : [email or web address] Or : [email or web address], password : [email or web address]

## 2018-10-28 NOTE — Progress Notes (Signed)
Virtual Visit via Video Note   This visit type was conducted due to national recommendations for restrictions regarding the COVID-19 Pandemic (e.g. social distancing) in an effort to limit this patient's exposure and mitigate transmission in our community.  Due to her co-morbid illnesses, this patient is at least at moderate risk for complications without adequate follow up.  This format is felt to be most appropriate for this patient at this time.  All issues noted in this document were discussed and addressed.  A limited physical exam was performed with this format.  Please refer to the patient's chart for her consent to telehealth for [facility]   I connected with  [name] on 10/28/18 by a video enabled telemedicine application and verified that I am speaking with the correct person using two identifiers. I discussed the limitations of evaluation and management by telemedicine. The patient expressed understanding and agreed to proceed.   Evaluation Performed:  Follow-up visit  Date:  10/28/2018   ID:  [name], [name] September 02, 1960, MRN [id number]  Patient Location:  [place] [address]   Provider location:   [facility], [place] office  PCP:  Abner Greenspan, MD  Cardiologist:  Patsy Baltimore   Chief Complaint:  Anxiety,chest pain   History of Present Illness:    Lindsay Fry is a 58 y.o. female who presents via audio/video conferencing for a telehealth visit today.   The patient does not symptoms concerning for COVID-19 infection (fever, chills, cough, or new SHORTNESS OF BREATH).   Patient has a past medical history of Morbid obesity,  CAD Severe ostial ramus disease treated medically by catheterization August 2016, hypertension,  hyperlipidemia,   previous symptoms of chest pain and shortness of breath.  Migraines Chronic tachycardia She presents today for follow-up of her coronary artery disease, hypertension,  hyperlipidemia  " chest pressure from anxiety" Money issues Does not feel the same as "heart attack" Takes tylenol Has valium, trid 10 mg , chest pain went away Happens at rest  BP elevated: 167/82, pulse 68 Very anxious, not sleeping well  Chronic shortness of breath, mild, deconditioning Less exercise  Denies any episodes of tachycardia or palpitations  Lab work reviewed with her HBA1C 6.5  Other past medical history Stress test was ordered November 14, 2016 This showed no significant ischemia, low risk scan, ejection fraction 74%  Previously had left arm pain coming on at rest. This seemed to resolve with NSAIDs. Also with several episodes of higher blood pressure, possibly associated with stress. He states that she has been taking clonidine once a day in the evening, amlodipine in the morning  Vertigo symptoms occurred in early 2014 and recurrent August 9. She is taking meclizine. She has dizziness and feels that she is drunk when she stands up.  History of hyperlipidemia. In June 2010, total cholesterol 314.  Remote smoking for a short period of time, none recently.   Prior CV studies:   The following studies were reviewed today:   Past Medical History:  Diagnosis Date  . ADD (attention deficit disorder) 11/19/2012  . Allergic rhinitis, cause unspecified   . Anxiety state, unspecified   . Arthritis   . Asymptomatic postmenopausal status (age-related) (natural)   . Atypical hyperplasia of left breast 2000  . Benign neoplasm of other and unspecified site of the digestive system   . Bipolar disorder ([place])   . Breast cancer ([place])    Left  Breast 55m invasive CA left uiq, dcis left uoq and a lot ADH, ALH in both breasts.  .Marland KitchenCAD (coronary artery disease)    a. 08/2014 NSTEMI/Cath: LM nl, LAD 50p, D1/D2 min irregs, LCX nl, OM1 40, LPDA nl, RI 99ost (2.040mvessel->med Rx), RCA nl, AM 60, nl EF; b. 11/2016 MV: EF 74%, no ischemia (performed 2/2 dyspnea & new inf TWI).  . Marland KitchenCarcinoma of left breast (HCSnover5/19/2016  . Cataract   . Chronic diastolic CHF (congestive heart failure) (HCPine [place]   a. 08/2014 Echo: EF 60-65%, Gr 1 DD, nl LA.  . Marland Kitchenlotting disorder (HCOscoda   on blood thiner, Plavix  . Cyst    on Achilles tendon  . Depression   . Diabetes mellitus (HCOxford   frank  . Dysuria   . Edema   . Family history of malignant neoplasm of gastrointestinal tract   . Fibromyalgia   . GERD (gastroesophageal reflux disease)   . Glaucoma    pre glaucoma, 05/14/2018 pt. denies glaucoma  . Headache    migraines  . Hiatal hernia   . History of alcoholism (HCBaldwin  . History of migraines   . History of ovarian cyst   . Hx of adenomatous colonic polyps   . Insomnia, unspecified   . Myocardial infarction (HCNorth Charleroi8-21-16  . OCD (obsessive compulsive disorder)   . Osteopenia 09/25/2016   Femoral neck T -1.1  9/18  . Other screening mammogram   . Pure hypercholesterolemia   . Rosacea   . Subacute confusional state 11/19/2012  . Tubular adenoma of colon 01/10/11  . Ulcerative colitis, unspecified   . Unspecified asthma(493.90)   . Unspecified essential hypertension   . Unspecified hypothyroidism   . Unspecified vitamin D deficiency   . Vertigo    Past Surgical History:  Procedure Laterality Date  . APPENDECTOMY    . AXILLARY SENTINEL NODE BIOPSY Left 05/23/2014   Procedure: AXILLARY SENTINEL NODE BIOPSY;  Surgeon: SeChristene LyeMD;  Location: [facility] ORS;  Service: General;  Laterality: Left;  . BREAST BIOPSY  Aug 2000   on tamoxifen, atypical hyperplasia  . BREAST BIOPSY Left 04/2014  . BREAST SURGERY Left Aug 2000   lumpectomy/ Dr CrSharlet [place]. BREAST SURGERY Bilateral 05/23/14   Mastectomy  . BUNIONECTOMY Right   . CARDIAC CATHETERIZATION N/A 09/05/2014   Procedure: Left Heart Cath and Coronary Angiography;  Surgeon: MuWellington HampshireMD;  [place] [facility];  Service: Cardiovascular;  Laterality: N/A;  . CARDIAC CATHETERIZATION  09-05-14  . CATARACT  EXTRACTION W/ INTRAOCULAR LENS IMPLANT Bilateral   . CESAREAN SECTION  1996/1997   placenta previa, gest DM, pre-eclampsia  . CHOLECYSTECTOMY  1997   adhesions also  . COLONOSCOPY  01/2001   Ulcerative colitis  . COLONOSCOPY  04/2004   UC, polyp  . COLONOSCOPY  12/08   UC, no polyps  . DEXA  04/1999 and 2010   normal  . ESOPHAGOGASTRODUODENOSCOPY  09/2001   polyp  . exercise stress test  11/2003   negative  . FEMUR FRACTURE SURGERY Left   . MASTECTOMY Bilateral   . MECKEL DIVERTICULUM EXCISION  1999  . NASAL SINUS SURGERY  05/1997  . nuclear stress test  06/2007   negative  . precancerous mole removed    . SIMPLE MASTECTOMY WITH AXILLARY SENTINEL NODE BIOPSY Bilateral 05/23/2014   Procedure: Bilateral simple mastectomy, left sentinel node biopsy ;  Surgeon: SeChristene LyeMD;  Location:  [facility] ORS;  Service: General;  Laterality: Bilateral;  . Sleep study  11/09   no apnea, but did snore (done by [facility])  . TONSILLECTOMY  1984  . TUBAL LIGATION    . WISDOM TOOTH EXTRACTION  1980's     Allergies:   Ephedrine, Aspartame and phenylalanine, Aspirin, Crestor [rosuvastatin], Erythromycin, Nsaids, and Oxycodone   Social History   Tobacco Use  . Smoking status: Former Smoker    Packs/day: 0.25    Years: 5.00    Pack years: 1.25    Types: Cigarettes    Quit date: 01/14/1993    Years since quitting: 25.8  . Smokeless tobacco: Never Used  Substance Use Topics  . Alcohol use: No    Alcohol/week: 0.0 standard drinks    Comment: Recovered ETOH  . Drug use: No     Current Outpatient Medications on File Prior to Visit  Medication Sig Dispense Refill  . amantadine (SYMMETREL) 100 MG capsule Take 100 mg by mouth 2 (two) times daily.    Marland Kitchen atorvastatin (LIPITOR) 80 MG tablet TAKE 1 TABLET(80 MG) BY MOUTH DAILY 90 tablet 3  . B-D UF III MINI PEN NEEDLES 31G X 5 MM MISC USE FOR INJECTIONS TWICE DAILY 100 each 0  . BAYER MICROLET LANCETS lancets USE AS DIRECTED TO CHECK BLOOD SUGAR  TWICE DAILY 200 each 2  . Blood Glucose Monitoring Suppl (CONTOUR NEXT EZ MONITOR) w/Device KIT USE AS DIRECTED TO CHECK BLOOD SUGAR TWICE DAILY 1 kit 2  . Calcium Carb-Cholecalciferol (CALCIUM PLUS VITAMIN D3) 600-800 MG-UNIT TABS Take 1 tablet by mouth daily.    . clopidogrel (PLAVIX) 75 MG tablet TAKE 1 TABLET(75 MG) BY MOUTH DAILY 90 tablet 2  . co-enzyme Q-10 50 MG capsule Take 50 mg by mouth daily.    . Cyanocobalamin (VITAMIN B-12 PO) 3000 mcg-- 2 gummies daily    . diazepam (VALIUM) 10 MG tablet Take 2.5-10 mg by mouth as needed for anxiety.    Marland Kitchen diltiazem (CARDIZEM CD) 240 MG 24 hr capsule TAKE 1 CAPSULE(240 MG) BY MOUTH DAILY 90 capsule 3  . diphenoxylate-atropine (LOMOTIL) 2.5-0.025 MG tablet Take 1-2 tabs daily as needed for diarrhea 50 tablet 0  . famotidine (PEPCID) 40 MG tablet Take 40 mg by mouth 2 (two) times daily.    Marland Kitchen FIBER SELECT GUMMIES PO Take 3 capsules by mouth daily.    Marland Kitchen gabapentin (NEURONTIN) 300 MG capsule TAKE 1 CAPSULE(300 MG) BY MOUTH THREE TIMES DAILY 90 capsule 5  . glucose blood (BAYER CONTOUR NEXT TEST) test strip USE AS DIRECTED TO CHECK BLOOD SUGAR TWICE DAILY 200 each 2  . Insulin Detemir (LEVEMIR FLEXTOUCH) 100 UNIT/ML Pen Inject 50 units under the skin once daily at night. May reduce by 4 units if fast BS is below 100. (Patient taking differently: 52 Units. Inject 52 units under the skin once daily at night. May reduce by 4 units if fast BS is below 100.) 15 mL 2  . Insulin Pen Needle (B-D UF III MINI PEN NEEDLES) 31G X 5 MM MISC USE FOR INJECTIONS TWICE DAILY 100 each 0  . INVOKANA 300 MG TABS tablet TAKE 1 TABLET(300 MG) BY MOUTH DAILY BEFORE BREAKFAST 30 tablet 3  . lamoTRIgine (LAMICTAL) 200 MG tablet Take 400 mg by mouth daily.    Marland Kitchen latanoprost (XALATAN) 0.005 % ophthalmic solution Place 1 drop into both eyes at bedtime as needed.     Marland Kitchen letrozole (FEMARA) 2.5 MG tablet TAKE 1 TABLET(2.5 MG)  BY MOUTH DAILY 30 tablet 12  . linaclotide (LINZESS) 290 MCG  CAPS capsule Take 1 capsule (290 mcg total) by mouth daily before breakfast. 30 capsule 11  . lithium carbonate (ESKALITH) 450 MG CR tablet Take 900 mg by mouth at bedtime.    . Melatonin 10 MG CAPS Take 1 capsule by mouth at bedtime.    . mesalamine (LIALDA) 1.2 g EC tablet Take 2 tablets (2.4 g total) by mouth daily. 60 tablet 11  . metoprolol succinate (TOPROL-XL) 50 MG 24 hr tablet Take 25 mg by mouth daily.   3  . Multiple Vitamin (MULTIVITAMIN) tablet Take 1 tablet by mouth daily.      . Omega-3 Krill Oil 500 MG CAPS Take 500 mg by mouth.    . ondansetron (ZOFRAN-ODT) 4 MG disintegrating tablet Take 1 tablet (4 mg total) by mouth every 6 (six) hours as needed for nausea or vomiting. 90 tablet 1  . OZEMPIC, 1 MG/DOSE, 2 MG/1.5ML SOPN INJECT 1 MG UNDER THE SKIN ONCE A WEEK 6 mL 1  . pantoprazole (PROTONIX) 40 MG tablet TAKE 1 TABLET(40 MG) BY MOUTH TWICE DAILY 60 tablet 11  . Probiotic Product (PROBIOTIC DAILY PO) Take by mouth.    . QUEtiapine (SEROQUEL XR) 400 MG 24 hr tablet Take 800 mg by mouth at bedtime.    . Simethicone (GAS-X PO) Take 1 tablet by mouth 4 (four) times daily.    . solifenacin (VESICARE) 5 MG tablet Take 1 tablet by mouth daily.    Marland Kitchen SYNTHROID 200 MCG tablet Take 1 tablet by mouth x 6 days and 2 tablets by mouth on day 7 34 tablet 2  . triamcinolone (KENALOG) 0.1 % paste Use as directed 1 application in the mouth or throat 2 (two) times daily. To affected areas 5 g 1  . valACYclovir (VALTREX) 1000 MG tablet TAKE 1 TABLET(1000 MG) BY MOUTH DAILY 30 tablet 5  . vitamin C (ASCORBIC ACID) 500 MG tablet Take 250 mg by mouth daily. Takes 2 a day    . VYVANSE 60 MG capsule Take 60 mg by mouth every morning.   0  . zaleplon (SONATA) 10 MG capsule Take 20 mg by mouth at bedtime as needed for sleep.     No current facility-administered medications on file prior to visit.      Family Hx: The patient's family history includes Alzheimer's disease in her father; Breast cancer in  an other family member; Colon cancer in her father; Colon polyps in her father; Coronary artery disease in her father, mother, and another family member; Crohn's disease in her mother; Dementia in her father; Diabetes in an other family member; Hypertension in her mother; Osteoporosis in her mother; Stroke in her cousin. There is no history of Esophageal cancer, Rectal cancer, or Stomach cancer.  ROS:   Please see the history of present illness.    Review of Systems  Constitutional: Negative.   HENT: Negative.   Respiratory: Negative.   Cardiovascular: Positive for chest pain.  Gastrointestinal: Negative.   Musculoskeletal: Negative.   Neurological: Negative.   Psychiatric/Behavioral: Negative.   All other systems reviewed and are negative.     Labs/Other Tests and Data Reviewed:    Recent Labs: 08/28/2018: Hemoglobin 15.4; Platelets 352 09/28/2018: ALT 41; BUN 14; Creatinine, Ser 0.84; Potassium 4.0; Sodium 142; TSH 14.49   Recent Lipid Panel Lab Results  Component Value Date/Time   CHOL 151 09/28/2018 12:30 PM   TRIG 135.0 09/28/2018 12:30 PM  HDL 45.40 09/28/2018 12:30 PM   CHOLHDL 3 09/28/2018 12:30 PM   LDLCALC 79 09/28/2018 12:30 PM   LDLDIRECT 67.0 11/10/2015 10:23 AM    Wt Readings from Last 3 Encounters:  10/28/18 207 lb (93.9 kg)  10/06/18 212 lb 3.2 oz (96.3 kg)  08/28/18 210 lb (95.3 kg)     Exam:    Vital Signs: Vital signs may also be detailed in the HPI Ht 5' 7" (1.702 m)   Wt 207 lb (93.9 kg)   LMP 08/23/2006 (Approximate) Comment: tubal ligation  BMI 32.42 kg/m   Wt Readings from Last 3 Encounters:  10/28/18 207 lb (93.9 kg)  10/06/18 212 lb 3.2 oz (96.3 kg)  08/28/18 210 lb (95.3 kg)   Temp Readings from Last 3 Encounters:  08/28/18 97.6 F (36.4 C)  06/16/18 98 F (36.7 C) (Oral)  05/14/18 99 F (37.2 C) (Temporal)   BP Readings from Last 3 Encounters:  10/06/18 140/82  08/28/18 121/86  06/16/18 120/70   Pulse Readings from Last 3  Encounters:  10/06/18 96  08/28/18 (!) 106  06/16/18 85     Well nourished, well developed female in no acute distress. Constitutional:  oriented to person, place, and time. No distress.    ASSESSMENT & PLAN:    Problem List Items Addressed This Visit    DM (diabetes mellitus) ([place]) (Chronic)   Sinus tachycardia    Other Visit Diagnoses    Atherosclerosis of native coronary artery of native heart with stable angina pectoris ([place])    -  Primary     Chest discomfort Having atypical chest pain She attributes this to anxiety, recent stressors concerning money, dental procedures and husband with medical procedures and the bills have been adding up -Does not think that she needs cardiac work-up at this time Likely also causing high blood pressure which was discussed with her in detail Recommend we refill her nitro and call us if symptoms get worse -Also recommended she take diltiazem 30 mg pills as needed 3 times daily for systolic pressure greater than 150  Hyperlipidemia Close to goal  Bipolar Discussed with her, recent stressors, is followed by psychiatrist Was seen last week, no medication changes made   COVID-19 Education: The signs and symptoms of COVID-19 were discussed with the patient and how to seek care for testing (follow up with PCP or arrange E-visit).  The importance of social distancing was discussed today.  Patient Risk:   After full review of this patients clinical status, I feel that they are at least moderate risk at this time.  Time:   Today, I have spent 25 minutes with the patient with telehealth technology discussing the cardiac and medical problems/diagnoses detailed above   Additional 10 min spent reviewing the chart prior to patient visit today   Medication Adjustments/Labs and Tests Ordered: Current medicines are reviewed at length with the patient today.  Concerns regarding medicines are outlined above.   Tests Ordered: No tests  ordered   Medication Changes: No changes made   Disposition: Follow-up in 12 months   Signed, Ida Rogue, MD  [place] [address] [place], [place], [place] [address]

## 2018-10-29 IMAGING — DX DG CERVICAL SPINE COMPLETE 4+V
6 series · 6 of 6 positions shown · non-contrast
Comparison: None.

CLINICAL DATA: Remote whiplash injury.  Pain.

EXAM:
CERVICAL SPINE - COMPLETE 4+ VIEW

[c-spine lat]
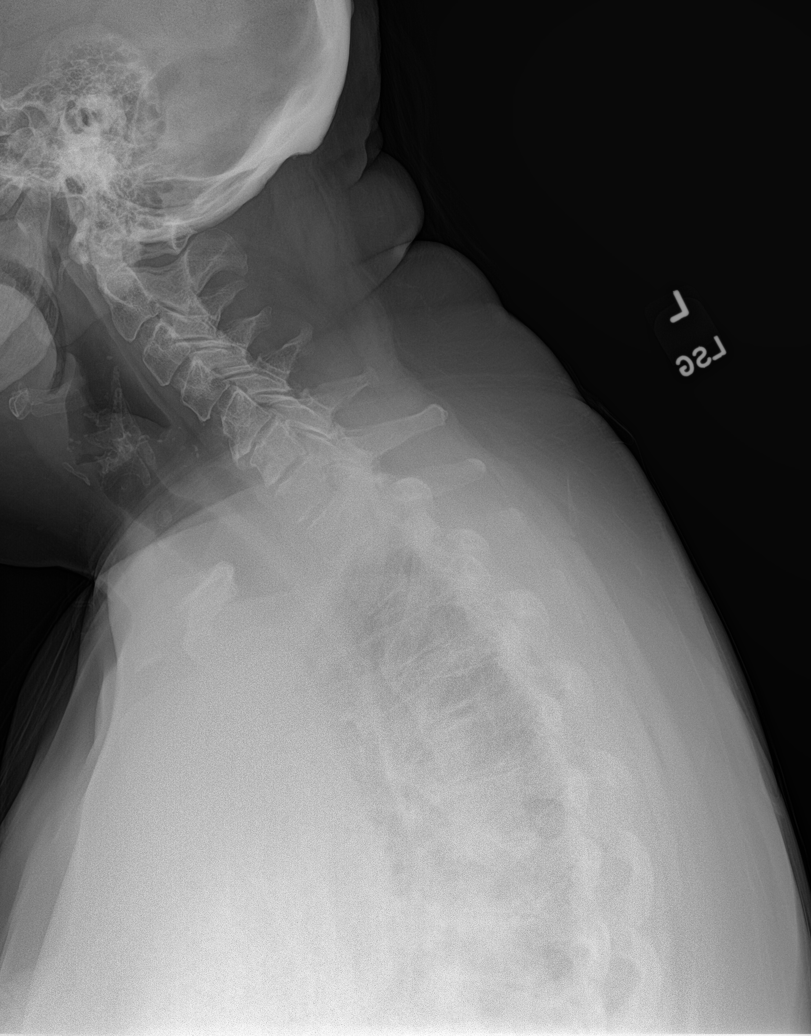

[c-spine obl (1 of 2)]
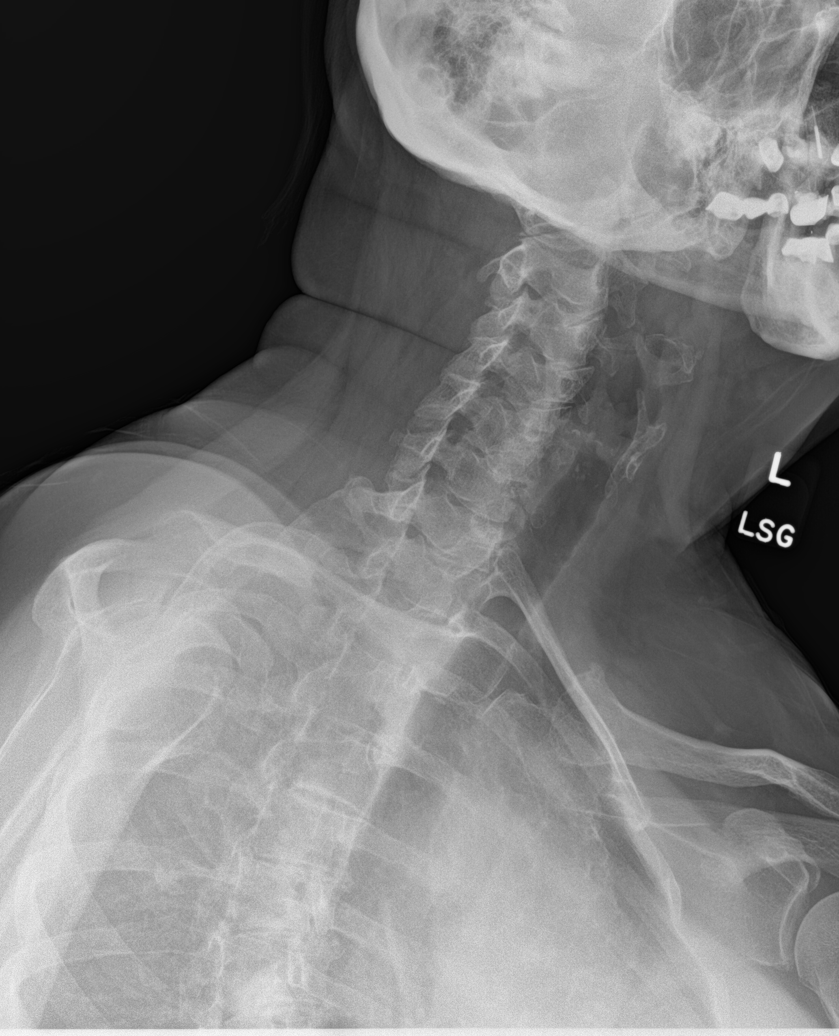

[c-spine obl (2 of 2)]
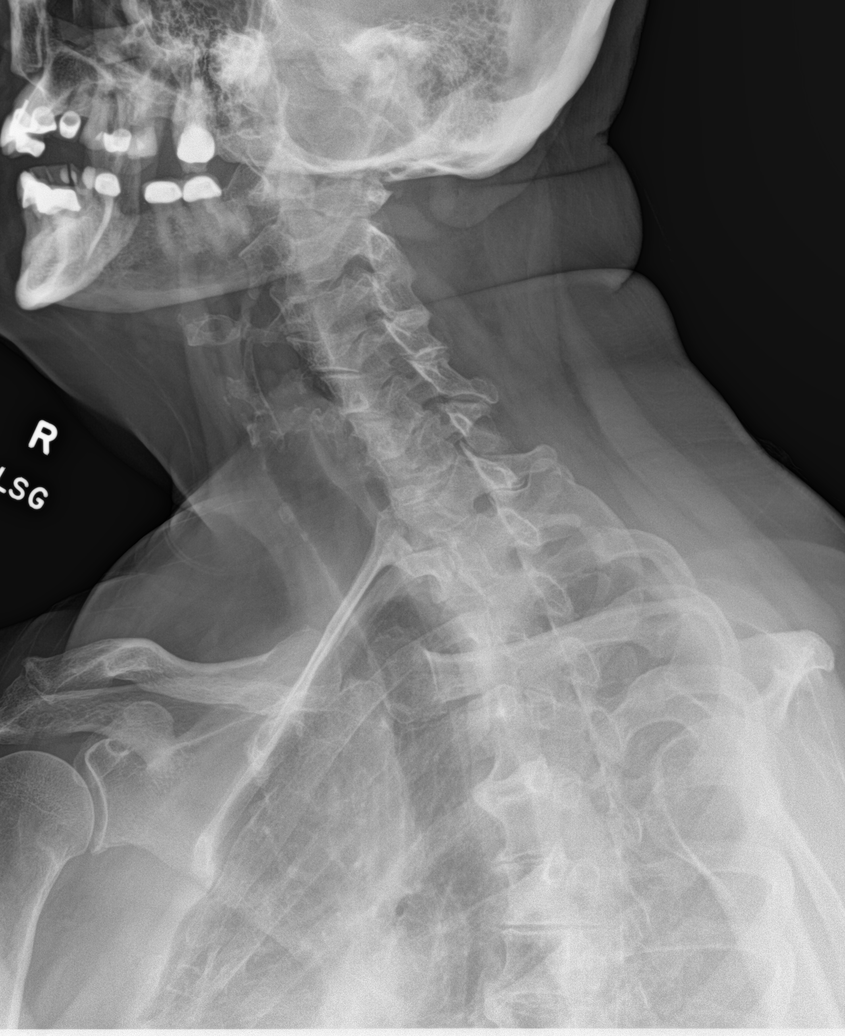

[c-spine ap]
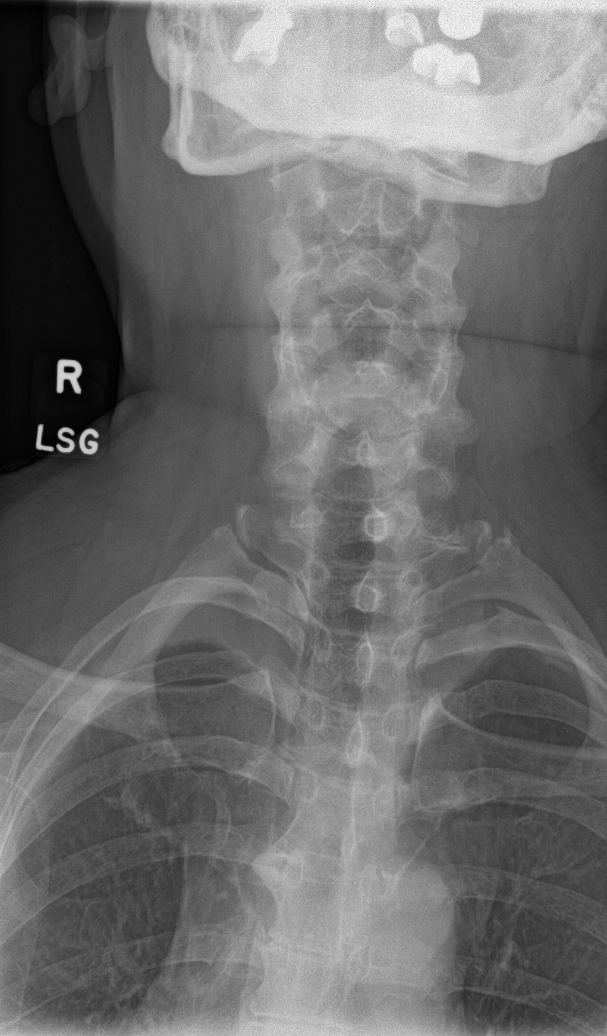

[c-spine open mouth]
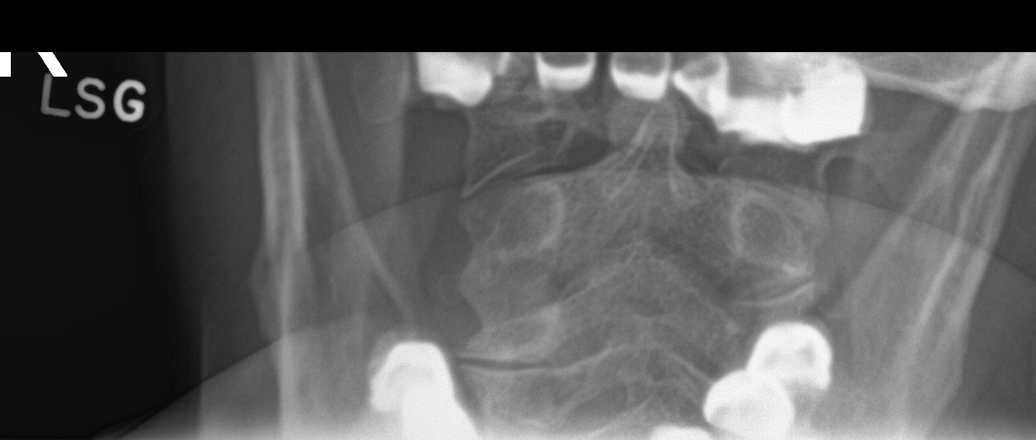

[c-spine swimmers]
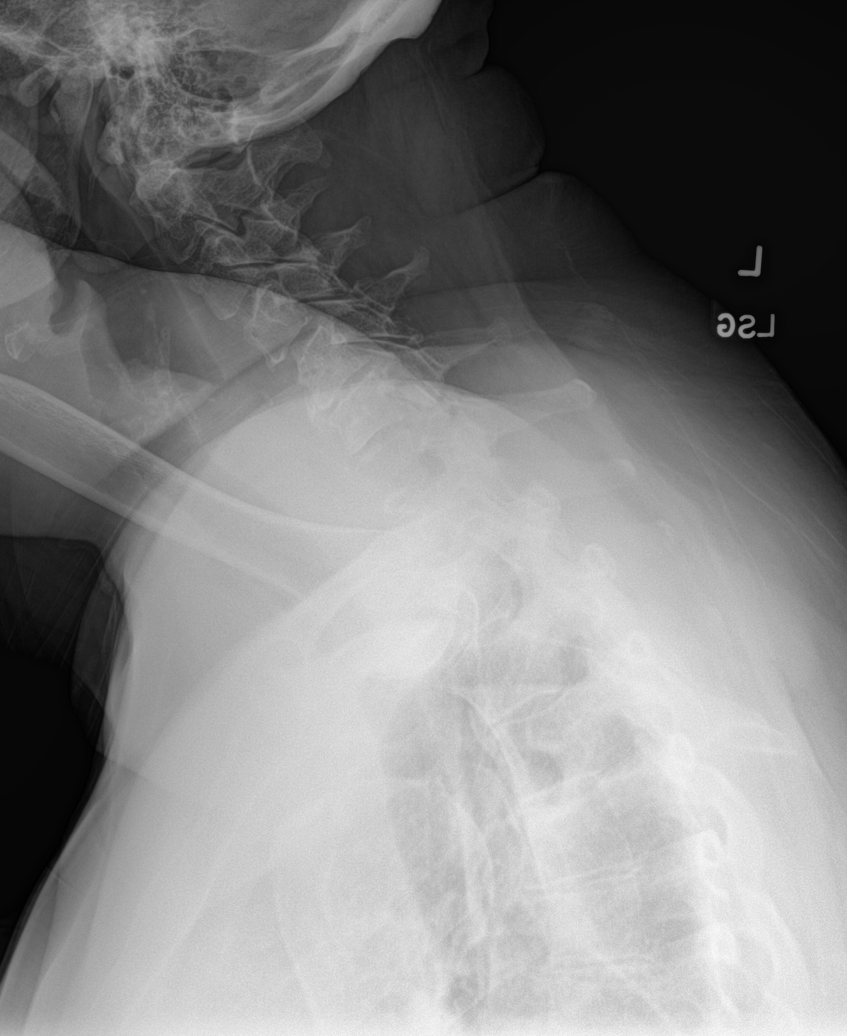

[6 of 6 positions shown; findings below may reference images not displayed]

FINDINGS: Straightening, slight reversal of the normal cervical lordosis. Disc
space narrowing at C5-6 and C6-7. Foraminal narrowing is worse on
the LEFT at those levels. Asymmetric RIGHT pleural thickening.
Negative odontoid.
IMPRESSION: Spondylosis as described, worst at C5-6 and C6-7

## 2018-11-04 ENCOUNTER — Ambulatory Visit (INDEPENDENT_AMBULATORY_CARE_PROVIDER_SITE_OTHER): Payer: BC Managed Care – PPO | Admitting: Psychology

## 2018-11-04 DIAGNOSIS — F3131 Bipolar disorder, current episode depressed, mild: Secondary | ICD-10-CM | POA: Diagnosis not present

## 2018-11-05 ENCOUNTER — Encounter: Payer: Self-pay | Admitting: Family Medicine

## 2018-11-05 ENCOUNTER — Other Ambulatory Visit: Payer: Self-pay | Admitting: Family Medicine

## 2018-11-05 NOTE — Telephone Encounter (Signed)
Pt has only had acute appts. Last OV was was a doxy on 06/19/18, no future appts besides a lab appt in Jan. Last filled on 04/29/18 #90 caps with 5 refills

## 2018-11-09 ENCOUNTER — Telehealth: Payer: Self-pay

## 2018-11-09 NOTE — Telephone Encounter (Signed)
Patient called and left a message on triage line stating that she has had bad headache, aching all over, fatigue, decreased appetite. Wonders if she should make an appointment with Dr Glori Bickers or go ahead and get tested for COVID. Called patient back to get more information but had to leave a message to call back.

## 2018-11-10 ENCOUNTER — Other Ambulatory Visit: Payer: Self-pay | Admitting: *Deleted

## 2018-11-10 DIAGNOSIS — Z20822 Contact with and (suspected) exposure to covid-19: Secondary | ICD-10-CM

## 2018-11-10 NOTE — Telephone Encounter (Signed)
I spoke with pt and she said her h/a is a 10 and pt is taking tylenol for pain; pt is aching all over, and tired. Pt denies other covid symptoms including fever and chills. Offered pt a virtual appt but pt said she will go to [facility] visitor entrance today for covid testing and will wait for results before scheduling appt. Pt will self quarantine. ED precautions given and pt voiced understanding.FYI to Dr Glori Bickers.

## 2018-11-10 NOTE — Telephone Encounter (Signed)
Aware, thanks  I will wait for result   With severe headache need to watch carefully-if stiff neck or worse pain should be evaluated in ER

## 2018-11-11 ENCOUNTER — Encounter: Payer: Self-pay | Admitting: Family Medicine

## 2018-11-11 LAB — NOVEL CORONAVIRUS, NAA: SARS-CoV-2, NAA: NOT DETECTED

## 2018-11-11 NOTE — Telephone Encounter (Signed)
I am blocked this afternoon anyway Another provider is fine  She has a h/o migraines  (? If she still also sees neurologist)

## 2018-11-11 NOTE — Telephone Encounter (Signed)
error 

## 2018-11-11 NOTE — Telephone Encounter (Signed)
Appointment 10/29

## 2018-11-11 NOTE — Telephone Encounter (Signed)
Patient called today and stated that she has an extreme migraine  She stated that she has not been able to sleep for the past 2 nights and can not take it anymore. Patient wanted to set up a virtual visit but next 30 min slot I could use would be this afternoon with you and the patient did not want to wait that long.  What do you suggest?  I did not want to put her with another provider until I spoke with you since you have several openings this afternoon

## 2018-11-12 ENCOUNTER — Encounter: Payer: Self-pay | Admitting: Family Medicine

## 2018-11-12 ENCOUNTER — Ambulatory Visit (INDEPENDENT_AMBULATORY_CARE_PROVIDER_SITE_OTHER): Payer: BC Managed Care – PPO | Admitting: Family Medicine

## 2018-11-12 DIAGNOSIS — G43C0 Periodic headache syndromes in child or adult, not intractable: Secondary | ICD-10-CM | POA: Diagnosis not present

## 2018-11-12 DIAGNOSIS — G43909 Migraine, unspecified, not intractable, without status migrainosus: Secondary | ICD-10-CM | POA: Insufficient documentation

## 2018-11-12 NOTE — Progress Notes (Signed)
Virtual Visit via Video Note  I connected with Lindsay Fry on 11/12/18 at 10:15 AM EDT by a video enabled telemedicine application and verified that I am speaking with the correct person using two identifiers.  Location: Patient: home Provider: office    I discussed the limitations of evaluation and management by telemedicine and the availability of in person appointments. The patient expressed understanding and agreed to proceed.  Parties taking part in encounter Patient: Lindsay Fry Treating physician: [name] MD   History of Present Illness: Migraine   Started last Friday  Has had a very hard time  Aleve did not help at all   Used to see Dr Domingo Cocking  Used to get an occ pred pack   Did finally sleep last night for the first time in a while   Is groggy today   Headache today is much improved  Yesterday 9/10  Today 2/10 - improved  L frontal  Throbbing  Had some speech issues- better now   Does generally get n/v with migraine  Did not vomit this time  Better now   Nothing helped  Thinks it worked itself out finally   [employer] for sleep - took one last night so she slept - and it broke the headache cycle   Caffeine - drinks 1-2 sodas   Fluids - gets a lot   Had chest pain recently - atypical She had a visit with Dr Rockey Situ Given some nitroglycerin-that may have started    covid negative  Patient Active Problem List   Diagnosis Date Noted  . Migraine 11/12/2018  . Exposure to COVID-19 virus 06/19/2018  . Thoracic back pain 02/19/2018  . Uncontrolled type 2 diabetes mellitus with hyperglycemia, with long-term current use of insulin ([place]) 02/19/2018  . Pleural thickening 11/05/2017  . Abrasion of right knee 11/05/2017  . Fall 11/05/2017  . Mouth ulcers 11/05/2017  . Cervical radiculopathy due to degenerative joint disease of spine 10/28/2017  . 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 Supraventricular tachycardia ([place]) 04/22/2017  . Osteopenia  09/25/2016  . Estrogen deficiency 05/30/2016  . Need for hepatitis C screening test 03/19/2016  . Screening for HIV (human immunodeficiency virus) 03/19/2016  . Carcinoma of overlapping sites of left breast in female, estrogen receptor positive ([place]) 08/21/2015  . Chronic constipation 01/18/2015  . NSTEMI (non-ST elevated myocardial infarction) ([place]) 09/06/2014  . CAD (coronary artery disease) 09/06/2014  . DM (diabetes mellitus) ([place]) 09/05/2014  . 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 Carcinoma of left breast ([place]) 06/02/2014  . Sinus tachycardia 04/27/2014  . Class 2 severe obesity due to excess calories with serious comorbidity and body mass index (BMI) of 36.0 to 36.9 in adult ([place]) 05/20/2013  . Bladder pain 02/17/2013  . ADD (attention deficit disorder) 11/19/2012  . Urinary frequency 08/05/2012  . Low back pain 08/05/2012  . 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 Chronic sinusitis 01/24/2012  . Vertigo, benign positional 01/24/2012  . Other screening mammogram 12/05/2010  . Routine general medical examination at a health care facility 10/14/2010  . FATIGUE 08/09/2009  . POSTMENOPAUSAL STATUS 06/22/2008  . Vitamin D deficiency 03/18/2008  . BENIGN NEOPLASM [place] SITE DIGESTIVE SYSTEM 01/09/2007  . HIATAL HERNIA 01/09/2007  . COLONIC POLYPS, ADENOMATOUS, HX OF 01/09/2007  . HYPERCHOLESTEROLEMIA 01/07/2007  . Bipolar disorder, unspecified ([place]) 01/07/2007  . Bipolar I disorder, most recent episode depressed ([place]) 01/07/2007  . Essential hypertension 01/07/2007  . ALLERGIC RHINITIS 01/07/2007  . ASTHMA 01/07/2007  .  GERD 01/07/2007  . Ulcerative colitis ([place]) 01/07/2007  . ACNE ROSACEA 01/07/2007  . MIGRAINES, HX OF 01/07/2007  . Fibromyalgia 10/30/2006  . INSOMNIA 10/30/2006   Past Medical History:  Diagnosis Date  . ADD (attention deficit disorder) 11/19/2012  . Allergic rhinitis, cause unspecified   . Anxiety state, unspecified   .  Arthritis   . Asymptomatic postmenopausal status (age-related) (natural)   . Atypical hyperplasia of left breast 2000  . Benign neoplasm of other and unspecified site of the digestive system   . Bipolar disorder ([place])   . Breast cancer ([place])    Left Breast 67m invasive CA left uiq, dcis left uoq and a lot ADH, ALH in both breasts.  .Marland KitchenCAD (coronary artery disease)    a. 08/2014 NSTEMI/Cath: LM nl, LAD 50p, D1/D2 min irregs, LCX nl, OM1 40, LPDA nl, RI 99ost (2.066mvessel->med Rx), RCA nl, AM 60, nl EF; b. 11/2016 MV: EF 74%, no ischemia (performed 2/2 dyspnea & new inf TWI).  . Marland Kitchenarcinoma of left breast ([facility]5/19/2016  . Cataract   . Chronic diastolic CHF (congestive heart failure) ([facility]   a. 08/2014 Echo: EF 60-65%, Gr 1 DD, nl LA.  . Marland Kitchenlotting disorder ([facility]   on blood thiner, Plavix  . Cyst    on Achilles tendon  . Depression   . Diabetes mellitus ([facility]   frank  . Dysuria   . Edema   . Family history of malignant neoplasm of gastrointestinal tract   . Fibromyalgia   . GERD (gastroesophageal reflux disease)   . Glaucoma    pre glaucoma, 05/14/2018 pt. denies glaucoma  . Headache    migraines  . Hiatal hernia   . History of alcoholism ([facility]  . History of migraines   . History of ovarian cyst   . Hx of adenomatous colonic polyps   . Insomnia, unspecified   . Myocardial infarction ([facility]8-21-16  . OCD (obsessive compulsive disorder)   . Osteopenia 09/25/2016   Femoral neck T -1.1  9/18  . Other screening mammogram   . Pure hypercholesterolemia   . Rosacea   . Subacute confusional state 11/19/2012  . Tubular adenoma of colon 01/10/11  . Ulcerative colitis, unspecified   . Unspecified asthma(493.90)   . Unspecified essential hypertension   . Unspecified hypothyroidism   . Unspecified vitamin D deficiency   . Vertigo    Past Surgical History:  Procedure Laterality Date  . APPENDECTOMY    . AXILLARY SENTINEL NODE BIOPSY Left 05/23/2014   Procedure: AXILLARY SENTINEL NODE  BIOPSY;  Surgeon: SeChristene LyeMD;  Location: [facility] ORS;  Service: General;  Laterality: Left;  . BREAST BIOPSY  Aug 2000   on tamoxifen, atypical hyperplasia  . BREAST BIOPSY Left 04/2014  . BREAST SURGERY Left Aug 2000   lumpectomy/ Dr CrSharlet Sal[place]. BREAST SURGERY Bilateral 05/23/14   Mastectomy  . BUNIONECTOMY Right   . CARDIAC CATHETERIZATION N/A 09/05/2014   Procedure: Left Heart Cath and Coronary Angiography;  Surgeon: MuWellington HampshireMD;  [facility];  Service: Cardiovascular;  Laterality: N/A;  . CARDIAC CATHETERIZATION  09-05-14  . CATARACT EXTRACTION W/ INTRAOCULAR LENS IMPLANT Bilateral   . CESAREAN SECTION  1996/1997   placenta previa, gest DM, pre-eclampsia  . CHOLECYSTECTOMY  1997   adhesions also  . COLONOSCOPY  01/2001   Ulcerative colitis  . COLONOSCOPY  04/2004   UC, polyp  . COLONOSCOPY  12/08  UC, no polyps  . DEXA  04/1999 and 2010   normal  . ESOPHAGOGASTRODUODENOSCOPY  09/2001   polyp  . exercise stress test  11/2003   negative  . FEMUR FRACTURE SURGERY Left   . MASTECTOMY Bilateral   . MECKEL DIVERTICULUM EXCISION  1999  . NASAL SINUS SURGERY  05/1997  . nuclear stress test  06/2007   negative  . precancerous mole removed    . SIMPLE MASTECTOMY WITH AXILLARY SENTINEL NODE BIOPSY Bilateral 05/23/2014   Procedure: Bilateral simple mastectomy, left sentinel node biopsy ;  Surgeon: Christene Lye, MD;  Location: [facility] ORS;  Service: General;  Laterality: Bilateral;  . Sleep study  11/09   no apnea, but did snore (done by [facility])  . TONSILLECTOMY  1984  . TUBAL LIGATION    . WISDOM TOOTH EXTRACTION  1980's   Social History   Tobacco Use  . Smoking status: Former Smoker    Packs/day: 0.25    Years: 5.00    Pack years: 1.25    Types: Cigarettes    Quit date: 01/14/1993    Years since quitting: 25.8  . Smokeless tobacco: Never Used  Substance Use Topics  . Alcohol use: No    Alcohol/week: 0.0 standard drinks    Comment:  Recovered ETOH  . Drug use: No   Family History  Problem Relation Age of Onset  . Coronary artery disease Father   . Colon cancer Father   . Alzheimer's disease Father   . Dementia Father   . Colon polyps Father   . Coronary artery disease Mother   . Hypertension Mother   . Osteoporosis Mother   . Crohn's disease Mother        crohns colitis  . Breast cancer Other        great aunts  . Coronary artery disease Other        Uncle (also AAA)  . Diabetes Other        remote family history  . Stroke Cousin   . Esophageal cancer Neg Hx   . Rectal cancer Neg Hx   . Stomach cancer Neg Hx    Allergies  Allergen Reactions  . Ephedrine Other (See Comments)    Pt becomes hyper  . Aspartame And Phenylalanine Nausea Only  . Aspirin     REACTION: aggrivates colitis  . Crestor [Rosuvastatin]     REACTION: increased lfts  . Erythromycin     REACTION: GI upset  . Nsaids     REACTION: aggrivate colitis  . Oxycodone Other (See Comments)    Panic Attack   Current Outpatient Medications on File Prior to Visit  Medication Sig Dispense Refill  . amantadine (SYMMETREL) 100 MG capsule Take 100 mg by mouth 2 (two) times daily.    Marland Kitchen atorvastatin (LIPITOR) 80 MG tablet TAKE 1 TABLET(80 MG) BY MOUTH DAILY 90 tablet 3  . B-D UF III MINI PEN NEEDLES 31G X 5 MM MISC USE FOR INJECTIONS TWICE DAILY 100 each 0  . BAYER MICROLET LANCETS lancets USE AS DIRECTED TO CHECK BLOOD SUGAR TWICE DAILY 200 each 2  . Blood Glucose Monitoring Suppl (CONTOUR NEXT EZ MONITOR) w/Device KIT USE AS DIRECTED TO CHECK BLOOD SUGAR TWICE DAILY 1 kit 2  . Calcium Carb-Cholecalciferol (CALCIUM PLUS VITAMIN D3) 600-800 MG-UNIT TABS Take 1 tablet by mouth daily.    . clopidogrel (PLAVIX) 75 MG tablet TAKE 1 TABLET(75 MG) BY MOUTH DAILY 90 tablet 2  . co-enzyme Q-10  50 MG capsule Take 50 mg by mouth daily.    . Cyanocobalamin (VITAMIN B-12 PO) 3000 mcg-- 2 gummies daily    . diazepam (VALIUM) 10 MG tablet Take 2.5-10 mg by  mouth as needed for anxiety.    Marland Kitchen diltiazem (CARDIZEM CD) 240 MG 24 hr capsule TAKE 1 CAPSULE(240 MG) BY MOUTH DAILY 90 capsule 3  . diltiazem (CARDIZEM) 30 MG tablet Take 1 tablet (30 mg) by mouth up to three times a day as needed for a blood pressure > 150 (top number) 90 tablet 1  . diphenoxylate-atropine (LOMOTIL) 2.5-0.025 MG tablet Take 1-2 tabs daily as needed for diarrhea 50 tablet 0  . famotidine (PEPCID) 40 MG tablet Take 40 mg by mouth 2 (two) times daily.    Marland Kitchen FIBER SELECT GUMMIES PO Take 3 capsules by mouth daily.    Marland Kitchen gabapentin (NEURONTIN) 300 MG capsule TAKE 1 CAPSULE(300 MG) BY MOUTH THREE TIMES DAILY 90 capsule 3  . glucose blood (BAYER CONTOUR NEXT TEST) test strip USE AS DIRECTED TO CHECK BLOOD SUGAR TWICE DAILY 200 each 2  . Insulin Detemir (LEVEMIR FLEXTOUCH) 100 UNIT/ML Pen Inject 50 units under the skin once daily at night. May reduce by 4 units if fast BS is below 100. (Patient taking differently: 52 Units. Inject 52 units under the skin once daily at night. May reduce by 4 units if fast BS is below 100.) 15 mL 2  . Insulin Pen Needle (B-D UF III MINI PEN NEEDLES) 31G X 5 MM MISC USE FOR INJECTIONS TWICE DAILY 100 each 0  . INVOKANA 300 MG TABS tablet TAKE 1 TABLET(300 MG) BY MOUTH DAILY BEFORE BREAKFAST 30 tablet 3  . lamoTRIgine (LAMICTAL) 200 MG tablet Take 400 mg by mouth daily.    Marland Kitchen latanoprost (XALATAN) 0.005 % ophthalmic solution Place 1 drop into both eyes at bedtime as needed.     Marland Kitchen letrozole (FEMARA) 2.5 MG tablet TAKE 1 TABLET(2.5 MG) BY MOUTH DAILY 30 tablet 12  . linaclotide (LINZESS) 290 MCG CAPS capsule Take 1 capsule (290 mcg total) by mouth daily before breakfast. 30 capsule 11  . lithium carbonate (ESKALITH) 450 MG CR tablet Take 900 mg by mouth at bedtime.    . Melatonin 10 MG CAPS Take 1 capsule by mouth at bedtime.    . mesalamine (LIALDA) 1.2 g EC tablet Take 2 tablets (2.4 g total) by mouth daily. 60 tablet 11  . metoprolol succinate (TOPROL-XL) 50 MG  24 hr tablet Take 25 mg by mouth daily.   3  . Multiple Vitamin (MULTIVITAMIN) tablet Take 1 tablet by mouth daily.      . nitroGLYCERIN (NITROSTAT) 0.4 MG SL tablet Place 1 tablet (0.4 mg total) under the tongue every 5 (five) minutes as needed for chest pain. 25 tablet 3  . Omega-3 Krill Oil 500 MG CAPS Take 500 mg by mouth.    . ondansetron (ZOFRAN-ODT) 4 MG disintegrating tablet Take 1 tablet (4 mg total) by mouth every 6 (six) hours as needed for nausea or vomiting. 90 tablet 1  . OZEMPIC, 1 MG/DOSE, 2 MG/1.5ML SOPN INJECT 1 MG UNDER THE SKIN ONCE A WEEK 6 mL 1  . pantoprazole (PROTONIX) 40 MG tablet TAKE 1 TABLET(40 MG) BY MOUTH TWICE DAILY 60 tablet 11  . Probiotic Product (PROBIOTIC DAILY PO) Take by mouth.    . QUEtiapine (SEROQUEL XR) 400 MG 24 hr tablet Take 800 mg by mouth at bedtime.    . Simethicone (GAS-X PO)  Take 1 tablet by mouth 4 (four) times daily.    . solifenacin (VESICARE) 5 MG tablet Take 1 tablet by mouth daily.    Marland Kitchen SYNTHROID 200 MCG tablet Take 1 tablet by mouth x 6 days and 2 tablets by mouth on day 7 34 tablet 2  . triamcinolone (KENALOG) 0.1 % paste Use as directed 1 application in the mouth or throat 2 (two) times daily. To affected areas 5 g 1  . valACYclovir (VALTREX) 1000 MG tablet TAKE 1 TABLET(1000 MG) BY MOUTH DAILY 30 tablet 5  . vitamin C (ASCORBIC ACID) 500 MG tablet Take 250 mg by mouth daily. Takes 2 a day    . VYVANSE 60 MG capsule Take 60 mg by mouth every morning.   0  . zaleplon (SONATA) 10 MG capsule Take 20 mg by mouth at bedtime as needed for sleep.     No current facility-administered medications on file prior to visit.    Review of Systems  Constitutional: Negative for chills, fever and malaise/fatigue.  HENT: Negative for congestion, ear pain, sinus pain and sore throat.   Eyes: Negative for blurred vision, discharge and redness.  Respiratory: Negative for cough, shortness of breath and stridor.   Cardiovascular: Negative for chest pain,  palpitations and leg swelling.  Gastrointestinal: Positive for nausea. Negative for abdominal pain, diarrhea and vomiting.  Musculoskeletal: Negative for myalgias.  Skin: Negative for rash.  Neurological: Positive for speech change and headaches. Negative for dizziness, tingling, tremors, sensory change, focal weakness, seizures, loss of consciousness and weakness.       L sided throbbing headache now much improved  No longer has speech problem  Psychiatric/Behavioral: Positive for depression.      Observations/Objective: Patient appears well, in no distress, but fatigued  Weight is baseline -obese No facial swelling or asymmetry Normal voice-not hoarse and no slurred speech No obvious tremor or mobility impairment Moving neck and UEs normally Able to hear the call well  No cough or shortness of breath during interview  Talkative and mentally sharp with no cognitive changes No skin changes on face or neck , no rash or pallor Affect is normal    Assessment and Plan: Problem List Items Addressed This Visit      Cardiovascular and Mediastinum   Migraine    Recent severe migraine headache was most likely triggered by nitroglycerine  Now feeling better after sleeping well last night Adv to avoid caffeine unless for a headache Keep fluids up  Use of ice on head/neck prn  If worse=would px prednisone taper and anti nausea medicine covid test negative-alerted pt  Update if not starting to improve in a week or if worsening             Follow Up Instructions: Keep up fluids and get some rest  Ice on neck and head is helpful  Be careful with nitroglycerine -it can cause significant headache  Avoid regular caffeine Keep fluids up  Alert [place] if symptoms worsen please  If signs/symptoms of stroke-go to ER/call 911   I discussed the assessment and treatment plan with the patient. The patient was provided an opportunity to ask questions and all were answered. The patient agreed  with the plan and demonstrated an understanding of the instructions.   The patient was advised to call back or seek an in-person evaluation if the symptoms worsen or if the condition fails to improve as anticipated.     [name], MD

## 2018-11-12 NOTE — Patient Instructions (Signed)
Keep up fluids and get some rest  Ice on neck and head is helpful  Be careful with nitroglycerine -it can cause significant headache  Avoid regular caffeine Keep fluids up  Alert [place] if symptoms worsen please  If signs/symptoms of stroke-go to ER/call 911

## 2018-11-12 NOTE — Assessment & Plan Note (Signed)
Recent severe migraine headache was most likely triggered by nitroglycerine  Now feeling better after sleeping well last night Adv to avoid caffeine unless for a headache Keep fluids up  Use of ice on head/neck prn  If worse=would px prednisone taper and anti nausea medicine covid test negative-alerted pt  Update if not starting to improve in a week or if worsening

## 2018-11-15 DIAGNOSIS — G43009 Migraine without aura, not intractable, without status migrainosus: Secondary | ICD-10-CM | POA: Diagnosis not present

## 2018-11-16 ENCOUNTER — Telehealth: Payer: Self-pay | Admitting: Family Medicine

## 2018-11-16 DIAGNOSIS — G43C0 Periodic headache syndromes in child or adult, not intractable: Secondary | ICD-10-CM

## 2018-11-16 MED ORDER — PREDNISONE 20 MG PO TABS: ORAL_TABLET | ORAL | 0 refills | Status: DC

## 2018-11-16 NOTE — Telephone Encounter (Signed)
[place] Night - Client Nonclinical Telephone Record AccessNurse Client [place] Night - Client Client Site Colon Physician [name] - MD Contact Type Call Who Is Calling Patient / Member / Family / Caregiver Caller Name Morissa Vonruden Caller Phone Number [phone] Patient Name Lindsay Fry Patient DOB [birth year] Call Type Message Only Information Provided Reason for Call Request to Schedule Office Appointment Initial Comment Caller states his wife was having a problem with migraine headache. Caller would like to schedule another appointment for today. Additional Comment Call Closed By: Salomon Fick Transaction Date/Time: 11/16/2018 7:26:53 AM (ET)

## 2018-11-16 NOTE — Telephone Encounter (Signed)
I sent a 60 mg pred taper  Hope it helps Ref done Will route to [facility]

## 2018-11-16 NOTE — Telephone Encounter (Signed)
Called patient and had to LVM. Called husband and told him we faxed over the Urgent Referral to Dr Barbaraann Cao office and they will call them directly to schedule a New patient appt because the patient hasnt been seen for over 3 years.

## 2018-11-16 NOTE — Telephone Encounter (Signed)
Left VM letting pt's husband know Rx sent to pharmacy and referral was done

## 2018-11-16 NOTE — Telephone Encounter (Signed)
Opened in error

## 2018-11-16 NOTE — Telephone Encounter (Signed)
Pt and husband notified of Dr. Marliss Coots comments. Pt only has one day of prednisone left (today) the ER only gave her a very small does. Pt is requesting a refill of the prednisone to the [facility] on [facility] [address]). Pt also needs a new referral to neurologist. They question if it can be urgent given her pain. Pt advise referral will be done and [facility] will call regarding schedule new appt. Please advise of the refill of prednisone

## 2018-11-16 NOTE — Telephone Encounter (Signed)
Mr Sivers left v/m that he understands there is a prescription for steroids to help with migraines and Mr Lowis request cb.

## 2018-11-16 NOTE — Telephone Encounter (Signed)
Received from pt's husband (who used his mychart account instead of hers )    8:12 PM Hi Dr Engineer, materials is concerning [employer].  Migraine came back Friday/Saturday/Sunday .   [facility]  PA gave [place] prescription for prednisone  Told us if new symptoms tonight to go to emergency room What do we do if prednisone doesn't work? What do we need to do long term Lindsay Fry   It sounds like she already has the prednisone   I want her to go back and see Dr Oscar La neuro/headache doctor  Does she need a referral? Let me know

## 2018-11-20 ENCOUNTER — Other Ambulatory Visit: Payer: Self-pay

## 2018-11-20 ENCOUNTER — Other Ambulatory Visit: Payer: Self-pay | Admitting: Endocrinology

## 2018-11-20 MED ORDER — SYNTHROID 200 MCG PO TABS: ORAL_TABLET | ORAL | 2 refills | Status: DC

## 2018-11-23 ENCOUNTER — Telehealth: Payer: Self-pay | Admitting: Family Medicine

## 2018-11-23 MED ORDER — PREDNISONE 20 MG PO TABS: ORAL_TABLET | ORAL | 0 refills | Status: DC

## 2018-11-23 NOTE — Telephone Encounter (Signed)
Left VM letting pt/husband know Rx sent to pharmacy

## 2018-11-23 NOTE — Telephone Encounter (Signed)
Patient's husband, Antony Haste, called.  Patient was told to call back and let Dr.Tower know if her headaches continue.  Patient was given Prednisone and nightly she's having migraines.  The prednisone seems to be keep it at [place]. Patient's appointment with Dr.Freeman is next Monday, 11/16.  Patient's husband wants to know if patient can be prescribed more prednisone to get her through to her appointment with Dr.Freeman. [facility] uses [facility] [address].

## 2018-11-23 NOTE — Telephone Encounter (Signed)
I sent it in  Thanks for the update

## 2018-11-30 DIAGNOSIS — G43019 Migraine without aura, intractable, without status migrainosus: Secondary | ICD-10-CM | POA: Diagnosis not present

## 2018-11-30 DIAGNOSIS — G43901 Migraine, unspecified, not intractable, with status migrainosus: Secondary | ICD-10-CM | POA: Diagnosis not present

## 2018-11-30 DIAGNOSIS — Z049 Encounter for examination and observation for unspecified reason: Secondary | ICD-10-CM | POA: Diagnosis not present

## 2018-12-01 ENCOUNTER — Other Ambulatory Visit: Payer: Self-pay | Admitting: Endocrinology

## 2018-12-16 ENCOUNTER — Ambulatory Visit: Payer: BC Managed Care – PPO | Admitting: Family Medicine

## 2018-12-16 ENCOUNTER — Encounter: Payer: Self-pay | Admitting: Family Medicine

## 2018-12-16 ENCOUNTER — Other Ambulatory Visit: Payer: Self-pay

## 2018-12-16 VITALS — BP 156/84 | HR 54 | Temp 96.6°F | Ht 67.0 in | Wt 204.5 lb

## 2018-12-16 DIAGNOSIS — E559 Vitamin D deficiency, unspecified: Secondary | ICD-10-CM | POA: Diagnosis not present

## 2018-12-16 DIAGNOSIS — E890 Postprocedural hypothyroidism: Secondary | ICD-10-CM | POA: Diagnosis not present

## 2018-12-16 DIAGNOSIS — R2689 Other abnormalities of gait and mobility: Secondary | ICD-10-CM

## 2018-12-16 DIAGNOSIS — R42 Dizziness and giddiness: Secondary | ICD-10-CM | POA: Diagnosis not present

## 2018-12-16 DIAGNOSIS — R5382 Chronic fatigue, unspecified: Secondary | ICD-10-CM

## 2018-12-16 DIAGNOSIS — R5383 Other fatigue: Secondary | ICD-10-CM | POA: Insufficient documentation

## 2018-12-16 NOTE — Progress Notes (Signed)
Subjective:    Patient ID: Lindsay Fry, female    DOB: [birth year], 58 y.o.   MRN: [id number]  HPI Pt presents with h/o fall and dizziness (loss of balance)   Gradual onset until the past 2-3 weeks    Yesterday in her room turned to the L and fell (lost balance)  Last week she went to get in the car and missed the seat- almost fell   Has been unable to go shopping by herself because she cannot seem to keep her balance well   Saw Dr Domingo Cocking for migraine- another course of prednisone and did very well  Mid November -finished that   occ cannot find the word she wants to use (episodic trouble concentrating)  Not slurring No facial droop  It keeps her from texting/socializing   Has not been able to do her usual aquatic exercise so her legs feel weaker than they were  Has to sit on a stool in the kitchen to cook/ gets tired   Has chronic back pain as well -has not been able to afford to continue her PT visits   Stressors- a lot of financial/doctor bills  Husband has parkinsons    New medicine changes  Continue diltiazem and added another 30 mg as needed for elevated bp  Has only needed once  Saw Dr Rockey Situ    Bipolar - very tired/lethargic lately  Some trouble sleeping  As of today she feels more manic (did have 1/2 cup of coffee)  No recent medicine changes   Diabetes-no big changes  Control could be better     Wt Readings from Last 3 Encounters:  12/16/18 204 lb 8 oz (92.8 kg)  10/28/18 207 lb (93.9 kg)  10/06/18 212 lb 3.2 oz (96.3 kg)   32.03 kg/m   BP is elevated BP Readings from Last 3 Encounters:  12/16/18 (!) 156/84  10/06/18 140/82  08/28/18 121/86   Pulse Readings from Last 3 Encounters:  12/16/18 (!) 54  10/06/18 96  08/28/18 (!) 106   Dr Domingo Cocking is her headache neurologist She has had w/u with EEG/imaging with Dr Jannifer Franklin to rule out organic cause of her symptoms (before being treated for bipolar with Dr Lajoyce Corners)   Last lithium level was ok  with Dr Dwyane Dee  Patient Active Problem List   Diagnosis Date Noted  . Dizziness 12/16/2018  . Poor balance 12/16/2018  . Fatigue 12/16/2018  . Migraine 11/12/2018  . Exposure to COVID-19 virus 06/19/2018  . Thoracic back pain 02/19/2018  . Uncontrolled type 2 diabetes mellitus with hyperglycemia, with long-term current use of insulin ([place]) 02/19/2018  . Pleural thickening 11/05/2017  . Abrasion of right knee 11/05/2017  . Fall 11/05/2017  . Mouth ulcers 11/05/2017  . Cervical radiculopathy due to degenerative joint disease of spine 10/28/2017  . 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 Supraventricular tachycardia ([place]) 04/22/2017  . Osteopenia 09/25/2016  . Estrogen deficiency 05/30/2016  . Need for hepatitis C screening test 03/19/2016  . Screening for HIV (human immunodeficiency virus) 03/19/2016  . Carcinoma of overlapping sites of left breast in female, estrogen receptor positive ([place]) 08/21/2015  . Chronic constipation 01/18/2015  . NSTEMI (non-ST elevated myocardial infarction) ([place]) 09/06/2014  . CAD (coronary artery disease) 09/06/2014  . DM (diabetes mellitus) ([place]) 09/05/2014  . 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 Carcinoma of left breast ([place]) 06/02/2014  . Sinus tachycardia 04/27/2014  . Class 2 severe obesity due to excess calories with serious comorbidity and  body mass index (BMI) of 36.0 to 36.9 in adult [facility]) 05/20/2013  . Bladder pain 02/17/2013  . ADD (attention deficit disorder) 11/19/2012  . Urinary frequency 08/05/2012  . Low back pain 08/05/2012  . Hypothyroid 05/18/2012  . Chronic sinusitis 01/24/2012  . Vertigo, benign positional 01/24/2012  . Other screening mammogram 12/05/2010  . Routine general medical examination at a health care facility 10/14/2010  . FATIGUE 08/09/2009  . POSTMENOPAUSAL STATUS 06/22/2008  . Vitamin D deficiency 03/18/2008  . BENIGN NEOPLASM [place] SITE DIGESTIVE SYSTEM 01/09/2007  . HIATAL HERNIA 01/09/2007  . COLONIC  POLYPS, ADENOMATOUS, HX OF 01/09/2007  . HYPERCHOLESTEROLEMIA 01/07/2007  . Bipolar disorder, unspecified ([place]) 01/07/2007  . Bipolar I disorder, most recent episode depressed ([place]) 01/07/2007  . Essential hypertension 01/07/2007  . ALLERGIC RHINITIS 01/07/2007  . ASTHMA 01/07/2007  . GERD 01/07/2007  . Ulcerative colitis ([place]) 01/07/2007  . ACNE ROSACEA 01/07/2007  . MIGRAINES, HX OF 01/07/2007  . Fibromyalgia 10/30/2006  . INSOMNIA 10/30/2006   Past Medical History:  Diagnosis Date  . ADD (attention deficit disorder) 11/19/2012  . Allergic rhinitis, cause unspecified   . Anxiety state, unspecified   . Arthritis   . Asymptomatic postmenopausal status (age-related) (natural)   . Atypical hyperplasia of left breast 2000  . Benign neoplasm of other and unspecified site of the digestive system   . Bipolar disorder ([place])   . Breast cancer ([place])    Left Breast 76m invasive CA left uiq, dcis left uoq and a lot ADH, ALH in both breasts.  .Marland KitchenCAD (coronary artery disease)    a. 08/2014 NSTEMI/Cath: LM nl, LAD 50p, D1/D2 min irregs, LCX nl, OM1 40, LPDA nl, RI 99ost (2.066mvessel->med Rx), RCA nl, AM 60, nl EF; b. 11/2016 MV: EF 74%, no ischemia (performed 2/2 dyspnea & new inf TWI).  . Marland Kitchenarcinoma of left breast (HCDamascus5/19/2016  . Cataract   . Chronic diastolic CHF (congestive heart failure) (HCD[place]   a. 08/2014 Echo: EF 60-65%, Gr 1 DD, nl LA.  . Marland Kitchenlotting disorder (HCPalestine   on blood thiner, Plavix  . Cyst    on Achilles tendon  . Depression   . Diabetes mellitus ([place]   frank  . Dysuria   . Edema   . Family history of malignant neoplasm of gastrointestinal tract   . Fibromyalgia   . GERD (gastroesophageal reflux disease)   . Glaucoma    pre glaucoma, 05/14/2018 pt. denies glaucoma  . Headache    migraines  . Hiatal hernia   . History of alcoholism (HCSt. Paul  . History of migraines   . History of ovarian cyst   . Hx of adenomatous colonic polyps   . Insomnia, unspecified   .  Myocardial infarction (HCUhland8-21-16  . OCD (obsessive compulsive disorder)   . Osteopenia 09/25/2016   Femoral neck T -1.1  9/18  . Other screening mammogram   . Pure hypercholesterolemia   . Rosacea   . Subacute confusional state 11/19/2012  . Tubular adenoma of colon 01/10/11  . Ulcerative colitis, unspecified   . Unspecified asthma(493.90)   . Unspecified essential hypertension   . Unspecified hypothyroidism   . Unspecified vitamin D deficiency   . Vertigo    Past Surgical History:  Procedure Laterality Date  . APPENDECTOMY    . AXILLARY SENTINEL NODE BIOPSY Left 05/23/2014   Procedure: AXILLARY SENTINEL NODE BIOPSY;  Surgeon: SeChristene LyeMD;  Location: [facility] ORS;  Service: General;  Laterality: Left;  . BREAST BIOPSY  Aug 2000   on tamoxifen, atypical hyperplasia  . BREAST BIOPSY Left 04/2014  . BREAST SURGERY Left Aug 2000   lumpectomy/ Dr Sharlet Salina  . BREAST SURGERY Bilateral 05/23/14   Mastectomy  . BUNIONECTOMY Right   . CARDIAC CATHETERIZATION N/A 09/05/2014   Procedure: Left Heart Cath and Coronary Angiography;  Surgeon: Wellington Hampshire, MD;  Location: [facility];  Service: Cardiovascular;  Laterality: N/A;  . CARDIAC CATHETERIZATION  09-05-14  . CATARACT EXTRACTION W/ INTRAOCULAR LENS IMPLANT Bilateral   . CESAREAN SECTION  1996/1997   placenta previa, gest DM, pre-eclampsia  . CHOLECYSTECTOMY  1997   adhesions also  . COLONOSCOPY  01/2001   Ulcerative colitis  . COLONOSCOPY  04/2004   UC, polyp  . COLONOSCOPY  12/08   UC, no polyps  . DEXA  04/1999 and 2010   normal  . ESOPHAGOGASTRODUODENOSCOPY  09/2001   polyp  . exercise stress test  11/2003   negative  . FEMUR FRACTURE SURGERY Left   . MASTECTOMY Bilateral   . MECKEL DIVERTICULUM EXCISION  1999  . NASAL SINUS SURGERY  05/1997  . nuclear stress test  06/2007   negative  . precancerous mole removed    . SIMPLE MASTECTOMY WITH AXILLARY SENTINEL NODE BIOPSY Bilateral 05/23/2014   Procedure:  Bilateral simple mastectomy, left sentinel node biopsy ;  Surgeon: Christene Lye, MD;  Location: [facility] ORS;  Service: General;  Laterality: Bilateral;  . Sleep study  11/09   no apnea, but did snore (done by [facility])  . TONSILLECTOMY  1984  . TUBAL LIGATION    . WISDOM TOOTH EXTRACTION  1980's   Social History   Tobacco Use  . Smoking status: Former Smoker    Packs/day: 0.25    Years: 5.00    Pack years: 1.25    Types: Cigarettes    Quit date: 01/14/1993    Years since quitting: 25.9  . Smokeless tobacco: Never Used  Substance Use Topics  . Alcohol use: No    Alcohol/week: 0.0 standard drinks    Comment: Recovered ETOH  . Drug use: No   Family History  Problem Relation Age of Onset  . Coronary artery disease Father   . Colon cancer Father   . Alzheimer's disease Father   . Dementia Father   . Colon polyps Father   . Coronary artery disease Mother   . Hypertension Mother   . Osteoporosis Mother   . Crohn's disease Mother        crohns colitis  . Breast cancer Other        great aunts  . Coronary artery disease Other        Uncle (also AAA)  . Diabetes Other        remote family history  . Stroke Cousin   . Esophageal cancer Neg Hx   . Rectal cancer Neg Hx   . Stomach cancer Neg Hx    Allergies  Allergen Reactions  . Ephedrine Other (See Comments)    Pt becomes hyper  . Aspartame And Phenylalanine Nausea Only  . Aspirin     REACTION: aggrivates colitis  . Crestor [Rosuvastatin]     REACTION: increased lfts  . Erythromycin     REACTION: GI upset  . Nsaids     REACTION: aggrivate colitis  . Oxycodone Other (See Comments)    Panic Attack   Current Outpatient Medications on File Prior to Visit  Medication Sig Dispense Refill  . amantadine (SYMMETREL) 100 MG capsule Take 100 mg by mouth 2 (two) times daily.    Marland Kitchen atorvastatin (LIPITOR) 80 MG tablet TAKE 1 TABLET(80 MG) BY MOUTH DAILY 90 tablet 3  . B-D UF III MINI PEN NEEDLES 31G X 5 MM MISC USE FOR  INJECTIONS TWICE DAILY 100 each 0  . BAYER MICROLET LANCETS lancets USE AS DIRECTED TO CHECK BLOOD SUGAR TWICE DAILY 200 each 2  . Blood Glucose Monitoring Suppl (CONTOUR NEXT EZ MONITOR) w/Device KIT USE AS DIRECTED TO CHECK BLOOD SUGAR TWICE DAILY 1 kit 2  . Calcium Carb-Cholecalciferol (CALCIUM PLUS VITAMIN D3) 600-800 MG-UNIT TABS Take 1 tablet by mouth daily.    . clopidogrel (PLAVIX) 75 MG tablet TAKE 1 TABLET(75 MG) BY MOUTH DAILY 90 tablet 2  . co-enzyme Q-10 50 MG capsule Take 50 mg by mouth daily.    . Cyanocobalamin (VITAMIN B-12 PO) 3000 mcg-- 2 gummies daily    . diazepam (VALIUM) 10 MG tablet Take 2.5-10 mg by mouth as needed for anxiety.    Marland Kitchen diltiazem (CARDIZEM CD) 240 MG 24 hr capsule TAKE 1 CAPSULE(240 MG) BY MOUTH DAILY 90 capsule 3  . diltiazem (CARDIZEM) 30 MG tablet Take 1 tablet (30 mg) by mouth up to three times a day as needed for a blood pressure > 150 (top number) 90 tablet 1  . diphenoxylate-atropine (LOMOTIL) 2.5-0.025 MG tablet Take 1-2 tabs daily as needed for diarrhea 50 tablet 0  . famotidine (PEPCID) 40 MG tablet Take 40 mg by mouth 2 (two) times daily.    Marland Kitchen FIBER SELECT GUMMIES PO Take 3 capsules by mouth daily.    Marland Kitchen gabapentin (NEURONTIN) 300 MG capsule TAKE 1 CAPSULE(300 MG) BY MOUTH THREE TIMES DAILY 90 capsule 3  . glucose blood (BAYER CONTOUR NEXT TEST) test strip USE AS DIRECTED TO CHECK BLOOD SUGAR TWICE DAILY 200 each 2  . Insulin Detemir (LEVEMIR FLEXTOUCH) 100 UNIT/ML Pen Inject 50 units under the skin once daily at night. May reduce by 4 units if fast BS is below 100. (Patient taking differently: 52 Units. Inject 52 units under the skin once daily at night. May reduce by 4 units if fast BS is below 100.) 15 mL 2  . Insulin Pen Needle (B-D UF III MINI PEN NEEDLES) 31G X 5 MM MISC USE FOR INJECTIONS TWICE DAILY 100 each 0  . INVOKANA 300 MG TABS tablet TAKE 1 TABLET(300 MG) BY MOUTH DAILY BEFORE BREAKFAST 30 tablet 3  . lamoTRIgine (LAMICTAL) 200 MG  tablet Take 400 mg by mouth daily.    Marland Kitchen latanoprost (XALATAN) 0.005 % ophthalmic solution Place 1 drop into both eyes at bedtime as needed.     Marland Kitchen letrozole (FEMARA) 2.5 MG tablet TAKE 1 TABLET(2.5 MG) BY MOUTH DAILY 30 tablet 12  . linaclotide (LINZESS) 290 MCG CAPS capsule Take 1 capsule (290 mcg total) by mouth daily before breakfast. 30 capsule 11  . lithium carbonate (ESKALITH) 450 MG CR tablet Take 900 mg by mouth at bedtime.    . Melatonin 10 MG CAPS Take 1 capsule by mouth at bedtime.    . mesalamine (LIALDA) 1.2 g EC tablet Take 2 tablets (2.4 g total) by mouth daily. 60 tablet 11  . metoprolol succinate (TOPROL-XL) 50 MG 24 hr tablet Take 25 mg by mouth daily.   3  . Multiple Vitamin (MULTIVITAMIN) tablet Take 1 tablet by mouth daily.      . nitroGLYCERIN (NITROSTAT) 0.4  MG SL tablet Place 1 tablet (0.4 mg total) under the tongue every 5 (five) minutes as needed for chest pain. 25 tablet 3  . Omega-3 Krill Oil 500 MG CAPS Take 500 mg by mouth.    . ondansetron (ZOFRAN-ODT) 4 MG disintegrating tablet Take 1 tablet (4 mg total) by mouth every 6 (six) hours as needed for nausea or vomiting. 90 tablet 1  . OZEMPIC, 1 MG/DOSE, 2 MG/1.5ML SOPN INJECT 1 MG UNDER THE SKIN ONCE A WEEK 6 mL 1  . pantoprazole (PROTONIX) 40 MG tablet TAKE 1 TABLET(40 MG) BY MOUTH TWICE DAILY 60 tablet 11  . Probiotic Product (PROBIOTIC DAILY PO) Take by mouth.    . QUEtiapine (SEROQUEL XR) 400 MG 24 hr tablet Take 800 mg by mouth at bedtime.    . Simethicone (GAS-X PO) Take 1 tablet by mouth 4 (four) times daily.    . solifenacin (VESICARE) 5 MG tablet Take 1 tablet by mouth daily.    Marland Kitchen SYNTHROID 200 MCG tablet Take 1 tablet by mouth x 6 days and 2 tablets by mouth on day 7 34 tablet 2  . triamcinolone (KENALOG) 0.1 % paste Use as directed 1 application in the mouth or throat 2 (two) times daily. To affected areas 5 g 1  . valACYclovir (VALTREX) 1000 MG tablet TAKE 1 TABLET(1000 MG) BY MOUTH DAILY 30 tablet 5  .  vitamin C (ASCORBIC ACID) 500 MG tablet Take 250 mg by mouth daily. Takes 2 a day    . VYVANSE 60 MG capsule Take 60 mg by mouth every morning.   0  . zaleplon (SONATA) 10 MG capsule Take 20 mg by mouth at bedtime as needed for sleep.     No current facility-administered medications on file prior to visit.     Review of Systems  Constitutional: Positive for activity change and fatigue. Negative for appetite change, fever and unexpected weight change.  HENT: Negative for congestion, ear pain, rhinorrhea, sinus pressure and sore throat.   Eyes: Negative for pain, redness and visual disturbance.  Respiratory: Negative for cough, shortness of breath and wheezing.   Cardiovascular: Negative for chest pain, palpitations and leg swelling.  Gastrointestinal: Negative for abdominal pain, blood in stool, constipation and diarrhea.  Endocrine: Negative for polydipsia and polyuria.  Genitourinary: Negative for dysuria, frequency and urgency.  Musculoskeletal: Positive for arthralgias, back pain and neck stiffness. Negative for myalgias.       Fibromyalgia   Skin: Negative for pallor and rash.  Allergic/Immunologic: Negative for environmental allergies.  Neurological: Positive for light-headedness. Negative for dizziness, tremors, seizures, syncope, facial asymmetry, numbness and headaches.       No headache since last migraine Generalized (not focal) weakness Loss of balance occ light headed   Word searching but no slurring of speech  Hematological: Negative for adenopathy. Does not bruise/bleed easily.  Psychiatric/Behavioral: Positive for decreased concentration, dysphoric mood and sleep disturbance. Negative for confusion and suicidal ideas. The patient is nervous/anxious.        Objective:   Physical Exam Constitutional:      General: She is not in acute distress.    Appearance: She is well-developed. She is obese. She is not ill-appearing.  HENT:     Head: Normocephalic and atraumatic.      Right Ear: External ear normal.     Left Ear: External ear normal.     Nose: Nose normal.     Mouth/Throat:     Mouth: Mucous membranes are moist.  Pharynx: No oropharyngeal exudate.  Eyes:     General: No scleral icterus.       Right eye: No discharge.        Left eye: No discharge.     Conjunctiva/sclera: Conjunctivae normal.     Pupils: Pupils are equal, round, and reactive to light.     Comments: No nystagmus  Neck:     Musculoskeletal: Full passive range of motion without pain, normal range of motion and neck supple. No neck rigidity or muscular tenderness.     Thyroid: No thyromegaly.     Vascular: No carotid bruit or JVD.     Trachea: No tracheal deviation.  Cardiovascular:     Rate and Rhythm: Regular rhythm. Bradycardia present.     Pulses: Normal pulses.     Heart sounds: Normal heart sounds. No murmur.  Pulmonary:     Effort: Pulmonary effort is normal. No respiratory distress.     Breath sounds: Normal breath sounds. No wheezing, rhonchi or rales.  Chest:     Chest wall: No tenderness.  Abdominal:     General: Bowel sounds are normal. There is no distension.     Palpations: Abdomen is soft. There is no mass.     Tenderness: There is no abdominal tenderness. There is no guarding or rebound.  Musculoskeletal:        General: No tenderness.     Right lower leg: No edema.     Left lower leg: No edema.     Comments: Mild kyphosis   Lymphadenopathy:     Cervical: No cervical adenopathy.  Skin:    General: Skin is warm and dry.     Coloration: Skin is not pale.     Findings: No erythema or rash.  Neurological:     Mental Status: She is alert and oriented to person, place, and time.     Cranial Nerves: Cranial nerves are intact. No cranial nerve deficit, dysarthria or facial asymmetry.     Sensory: Sensation is intact. No sensory deficit.     Motor: No weakness, tremor, atrophy, abnormal muscle tone, seizure activity or pronator drift.     Coordination:  Romberg sign negative. Coordination normal. Finger-Nose-Finger Test normal. Rapid alternating movements normal.     Gait: Gait normal.     Deep Tendon Reflexes: Reflexes are normal and symmetric. Reflexes normal.     Comments: No focal cerebellar signs   Gait is slow and steady  Not shuffling/no ataxia  Seems unsure of herself with walking   No tremor   Symmetric strength in all extremities     Psychiatric:        Attention and Perception: She is attentive.        Mood and Affect: Affect is flat. Affect is not tearful.        Speech: Speech normal.        Behavior: Behavior is slowed.        Thought Content: Thought content normal. Thought content is not paranoid or delusional.        Cognition and Memory: Memory normal.     Comments: No word finding problems during our interview   [place]:   Problem List Items Addressed This Visit      Endocrine   Hypothyroid    Recent inc in fatigue and poor balance  TSH with lab today      Relevant Orders  TSH     Other   Dizziness    Pt c/o occasional lightheadedness along with vague loss of balance (with a fall) and fatigue  Medically complex / suspect some degree of polypharmacy (she disagrees)  Re assuring neuro exam today  Labs drawn  Disc imp of use of walker and adequate hydration      Relevant Orders   CBC w/Diff   Comprehensive metabolic panel   Poor balance - Primary    In medically complex pt with many medications (incl psychiatric for bipolar with lithium) Also fatigue/sense of unease  Unclear re: whether she may have spells One fall-no injury Disc fall prev in detail-enc to use her walker  Labs today  Reassuring neruo exam       Fatigue    Acute on chronic  Assoc with loss of balance and possible spells of dizziness  Complex pt with polypharmacy Lab today  Disc safety/fall prev       Relevant Orders   CBC w/Diff   Comprehensive metabolic panel   TSH    Vitamin B12

## 2018-12-16 NOTE — Assessment & Plan Note (Signed)
In medically complex pt with many medications (incl psychiatric for bipolar with lithium) Also fatigue/sense of unease  Unclear re: whether she may have spells One fall-no injury Disc fall prev in detail-enc to use her walker  Labs today  Reassuring neruo exam

## 2018-12-16 NOTE — Assessment & Plan Note (Signed)
Recent inc in fatigue and poor balance  TSH with lab today

## 2018-12-16 NOTE — Assessment & Plan Note (Signed)
Pt c/o occasional lightheadedness along with vague loss of balance (with a fall) and fatigue  Medically complex / suspect some degree of polypharmacy (she disagrees)  Re assuring neuro exam today  Labs drawn  Disc imp of use of walker and adequate hydration

## 2018-12-16 NOTE — Patient Instructions (Addendum)
Your neurologic exam is re assuring   Let's get some labs today to start then we can make a plan from there   Use a walker if you feel unsteady   Keep a chair near by   If symptoms suddenly worsen or change let me know

## 2018-12-16 NOTE — Assessment & Plan Note (Signed)
Acute on chronic  Assoc with loss of balance and possible spells of dizziness  Complex pt with polypharmacy Lab today  Disc safety/fall prev

## 2018-12-17 LAB — TSH: TSH: 3.15 u[IU]/mL (ref 0.35–4.50)

## 2018-12-17 LAB — CBC WITH DIFFERENTIAL/PLATELET
Basophils Absolute: 0.1 10*3/uL (ref 0.0–0.1)
Basophils Relative: 1.1 % (ref 0.0–3.0)
Eosinophils Absolute: 0.3 10*3/uL (ref 0.0–0.7)
Eosinophils Relative: 4 % (ref 0.0–5.0)
HCT: 45.1 % (ref 36.0–46.0)
Hemoglobin: 14.7 g/dL (ref 12.0–15.0)
Lymphocytes Relative: 18.6 % (ref 12.0–46.0)
Lymphs Abs: 1.4 10*3/uL (ref 0.7–4.0)
MCHC: 32.7 g/dL (ref 30.0–36.0)
MCV: 93.8 fl (ref 78.0–100.0)
Monocytes Absolute: 0.9 10*3/uL (ref 0.1–1.0)
Monocytes Relative: 11.7 % (ref 3.0–12.0)
Neutro Abs: 5 10*3/uL (ref 1.4–7.7)
Neutrophils Relative %: 64.6 % (ref 43.0–77.0)
Platelets: 370 10*3/uL (ref 150.0–400.0)
RBC: 4.81 Mil/uL (ref 3.87–5.11)
RDW: 14.8 % (ref 11.5–15.5)
WBC: 7.7 10*3/uL (ref 4.0–10.5)

## 2018-12-17 LAB — COMPREHENSIVE METABOLIC PANEL
ALT: 24 U/L (ref 0–35)
AST: 15 U/L (ref 0–37)
Albumin: 4.4 g/dL (ref 3.5–5.2)
Alkaline Phosphatase: 137 U/L — ABNORMAL HIGH (ref 39–117)
BUN: 7 mg/dL (ref 6–23)
CO2: 26 mEq/L (ref 19–32)
Calcium: 9.8 mg/dL (ref 8.4–10.5)
Chloride: 100 mEq/L (ref 96–112)
Creatinine, Ser: 0.86 mg/dL (ref 0.40–1.20)
GFR: 67.22 mL/min (ref >60.00)
Glucose, Bld: 285 mg/dL — ABNORMAL HIGH (ref 70–99)
Potassium: 3.8 mEq/L (ref 3.5–5.1)
Sodium: 139 mEq/L (ref 135–145)
Total Bilirubin: 0.7 mg/dL (ref 0.2–1.2)
Total Protein: 6.7 g/dL (ref 6.0–8.3)

## 2018-12-17 LAB — VITAMIN B12: Vitamin B-12: 600 pg/mL (ref 211–911)

## 2018-12-18 ENCOUNTER — Telehealth: Payer: Self-pay | Admitting: *Deleted

## 2018-12-18 NOTE — Telephone Encounter (Signed)
Called pt regarding lab results and no answer and pt's VM box was full. No message left

## 2018-12-21 ENCOUNTER — Telehealth: Payer: Self-pay | Admitting: Family Medicine

## 2018-12-21 ENCOUNTER — Other Ambulatory Visit: Payer: Self-pay | Admitting: Internal Medicine

## 2018-12-21 DIAGNOSIS — R42 Dizziness and giddiness: Secondary | ICD-10-CM

## 2018-12-21 DIAGNOSIS — R2689 Other abnormalities of gait and mobility: Secondary | ICD-10-CM

## 2018-12-21 NOTE — Telephone Encounter (Signed)
Referral done Will forward to [facility]

## 2018-12-21 NOTE — Telephone Encounter (Signed)
-----   Message from Tammi Sou, [place] sent at 12/21/2018  4:21 PM EST ----- Pt notified of Dr. Marliss Coots comments off of mychart. She said she does want to proceed with neurologist referral. Pt said since appt she is still having memory issues, (day to day/ hr to hr sometimes), also pt told me recently she was standing up and she was fine and then she turned her head to side and the next thing she knows she was "on the floor", pt's son's were at the house and they both helped her up this happened since her OV with Dr. Glori Bickers, she just wanted to update Dr. Glori Bickers on how she's doing

## 2018-12-23 NOTE — Telephone Encounter (Signed)
Addressed through result notes  

## 2018-12-25 ENCOUNTER — Other Ambulatory Visit: Payer: Self-pay | Admitting: Cardiovascular Disease

## 2018-12-29 ENCOUNTER — Ambulatory Visit (INDEPENDENT_AMBULATORY_CARE_PROVIDER_SITE_OTHER): Payer: BC Managed Care – PPO | Admitting: Psychology

## 2018-12-29 DIAGNOSIS — F3131 Bipolar disorder, current episode depressed, mild: Secondary | ICD-10-CM | POA: Diagnosis not present

## 2018-12-30 ENCOUNTER — Other Ambulatory Visit: Payer: Self-pay

## 2018-12-30 MED ORDER — CANAGLIFLOZIN 300 MG PO TABS: ORAL_TABLET | ORAL | 3 refills | Status: DC

## 2018-12-31 ENCOUNTER — Encounter: Payer: Self-pay | Admitting: Family Medicine

## 2019-02-01 ENCOUNTER — Telehealth: Payer: Self-pay | Admitting: Family Medicine

## 2019-02-01 NOTE — Telephone Encounter (Signed)
-----   Message from [name] sent at 01/26/2019  3:06 PM EST ----- Regarding: Lab orders for Tuesday, 1.19.21 Lab orders, no f/u

## 2019-02-02 ENCOUNTER — Other Ambulatory Visit (INDEPENDENT_AMBULATORY_CARE_PROVIDER_SITE_OTHER): Payer: BC Managed Care – PPO

## 2019-02-02 DIAGNOSIS — E890 Postprocedural hypothyroidism: Secondary | ICD-10-CM | POA: Diagnosis not present

## 2019-02-02 LAB — T4, FREE: Free T4: 0.85 ng/dL (ref 0.60–1.60)

## 2019-02-02 LAB — TSH: TSH: 0.54 u[IU]/mL (ref 0.35–4.50)

## 2019-02-04 ENCOUNTER — Other Ambulatory Visit: Payer: Self-pay | Admitting: Cardiovascular Disease

## 2019-02-05 ENCOUNTER — Other Ambulatory Visit: Payer: Self-pay

## 2019-02-05 ENCOUNTER — Encounter: Payer: Self-pay | Admitting: Neurology

## 2019-02-05 ENCOUNTER — Ambulatory Visit (INDEPENDENT_AMBULATORY_CARE_PROVIDER_SITE_OTHER): Payer: BC Managed Care – PPO | Admitting: Neurology

## 2019-02-05 ENCOUNTER — Ambulatory Visit (INDEPENDENT_AMBULATORY_CARE_PROVIDER_SITE_OTHER): Payer: BC Managed Care – PPO | Admitting: Endocrinology

## 2019-02-05 ENCOUNTER — Encounter: Payer: Self-pay | Admitting: Endocrinology

## 2019-02-05 VITALS — BP 124/70 | HR 100 | Ht 66.5 in | Wt 204.2 lb

## 2019-02-05 DIAGNOSIS — C50112 Malignant neoplasm of central portion of left female breast: Secondary | ICD-10-CM | POA: Diagnosis not present

## 2019-02-05 DIAGNOSIS — C50111 Malignant neoplasm of central portion of right female breast: Secondary | ICD-10-CM | POA: Diagnosis not present

## 2019-02-05 DIAGNOSIS — Z9013 Acquired absence of bilateral breasts and nipples: Secondary | ICD-10-CM | POA: Diagnosis not present

## 2019-02-05 DIAGNOSIS — E890 Postprocedural hypothyroidism: Secondary | ICD-10-CM

## 2019-02-05 DIAGNOSIS — G3281 Cerebellar ataxia in diseases classified elsewhere: Secondary | ICD-10-CM

## 2019-02-05 DIAGNOSIS — E7800 Pure hypercholesterolemia, unspecified: Secondary | ICD-10-CM | POA: Diagnosis not present

## 2019-02-05 DIAGNOSIS — E119 Type 2 diabetes mellitus without complications: Secondary | ICD-10-CM | POA: Diagnosis not present

## 2019-02-05 DIAGNOSIS — E559 Vitamin D deficiency, unspecified: Secondary | ICD-10-CM | POA: Diagnosis not present

## 2019-02-05 LAB — LIPID PANEL
Cholesterol: 151 mg/dL (ref 0–200)
HDL: 47.3 mg/dL (ref >39.00)
LDL Cholesterol: 74 mg/dL (ref 0–99)
NonHDL: 103.74
Total CHOL/HDL Ratio: 3
Triglycerides: 150 mg/dL — ABNORMAL HIGH (ref 0.0–149.0)
VLDL: 30 mg/dL (ref 0.0–40.0)

## 2019-02-05 LAB — POCT GLYCOSYLATED HEMOGLOBIN (HGB A1C): Hemoglobin A1C: 6.2 % — AB (ref 4.0–5.6)

## 2019-02-05 LAB — BASIC METABOLIC PANEL
BUN: 10 mg/dL (ref 6–23)
CO2: 25 mEq/L (ref 19–32)
Calcium: 10.1 mg/dL (ref 8.4–10.5)
Chloride: 104 mEq/L (ref 96–112)
Creatinine, Ser: 0.83 mg/dL (ref 0.40–1.20)
GFR: 70 mL/min (ref >60.00)
Glucose, Bld: 91 mg/dL (ref 70–99)
Potassium: 3.7 mEq/L (ref 3.5–5.1)
Sodium: 139 mEq/L (ref 135–145)

## 2019-02-05 NOTE — Patient Instructions (Addendum)
Check blood sugars on waking up 3-4 days a week  Also check blood sugars about 2 hours after meals and do this after different meals by rotation  Recommended blood sugar levels on waking up are 90-130 and about 2 hours after meal is 130-160  Please bring your blood sugar monitor to each visit, thank you  Take extra 1/2 synthroid on sundays  ? Vitamin D amount

## 2019-02-05 NOTE — Progress Notes (Signed)
Reason for visit: Gait disorder, memory disorder  Referring physician: Dr. Trudi Ida Lindsay Fry is a 59 y.o. female  History of present illness:  Lindsay Fry is a 59 year old right-handed white female with a history of central obesity, fibromyalgia, ulcerative colitis, migraine headache, bipolar disorder, diabetes, hypertension, anxiety and OCD behavior, ADD and history of chronic low back pain.  The patient was seen in the emergency room on 16 June 2018 with dizziness that she claimed lasted about 30 seconds and resolved, the patient claims currently she is not having any problems with dizziness.  She states that around 16 November 2018 she had a sudden onset issue with feeling weak in a generalized fashion, she began having problems with word finding and she would sometimes have nonsense sentences when she tried to talk.  Her handwriting was similarly affected.  The patient had a tendency to lean to the left, she reported a lot of difficulty maintaining balance and was starting to use a walker at nighttime to get to the bathroom to keep from falling.  She claims that more recently she has had some improvement in her ability to walk.  She has had short-term memory issues that have also gotten better but not normalized.  She again reports no dizziness.  She reports no focal numbness or weakness of the face, arms, legs.  She denies any numbness in the feet associated with diabetes.  She denies issues with blacking out or difficulty controlling the bowels or the bladder.  At times her legs may feel somewhat weak.  She denies any significant issues with migraine headaches currently.  She states that her blood sugars have been well controlled, she denies any episodes of hypoglycemia.  She denies any significant changes in medications around the time of onset of symptoms.  She is sent to this office for an evaluation.  Past Medical History:  Diagnosis Date  . ADD (attention deficit disorder) 11/19/2012   . Allergic rhinitis, cause unspecified   . Anxiety state, unspecified   . Arthritis   . Asymptomatic postmenopausal status (age-related) (natural)   . Atypical hyperplasia of left breast 2000  . Benign neoplasm of other and unspecified site of the digestive system   . Bipolar disorder ([place])   . Breast cancer ([place])    Left Breast 45m invasive CA left uiq, dcis left uoq and a lot ADH, ALH in both breasts.  .Marland KitchenCAD (coronary artery disease)    a. 08/2014 NSTEMI/Cath: LM nl, LAD 50p, D1/D2 min irregs, LCX nl, OM1 40, LPDA nl, RI 99ost (2.045mvessel->med Rx), RCA nl, AM 60, nl EF; b. 11/2016 MV: EF 74%, no ischemia (performed 2/2 dyspnea & new inf TWI).  . Marland Kitchenarcinoma of left breast ([facility]/19/2016  . Cataract   . Chronic diastolic CHF (congestive heart failure) ([facility]   a. 08/2014 Echo: EF 60-65%, Gr 1 DD, nl LA.  . Marland [place]lotting disorder ([facility] [place]   on blood thiner, Plavix  . Cyst    on Achilles tendon  . Depression   . Diabetes mellitus ([facility]   frank  . Dysuria   . Edema   . Family history of malignant neoplasm of gastrointestinal tract   . Fibromyalgia   . GERD (gastroesophageal reflux disease)   . Glaucoma    pre glaucoma, 05/14/2018 pt. denies glaucoma  . Headache    migraines  . Hiatal hernia   . History of alcoholism ([facility] [place]  . History of migraines   .  History of ovarian cyst   . Hx of adenomatous colonic polyps   . Insomnia, unspecified   . Myocardial infarction ([place]) 09-04-14  . OCD (obsessive compulsive disorder)   . Osteopenia 09/25/2016   Femoral neck T -1.1  9/18  . Other screening mammogram   . Pure hypercholesterolemia   . Rosacea   . Subacute confusional state 11/19/2012  . Tubular adenoma of colon 01/10/11  . Ulcerative colitis, unspecified   . Unspecified asthma(493.90)   . Unspecified essential hypertension   . Unspecified hypothyroidism   . Unspecified vitamin D deficiency   . Vertigo     Past Surgical History:  Procedure Laterality Date  . APPENDECTOMY    .  AXILLARY SENTINEL NODE BIOPSY Left 05/23/2014   Procedure: AXILLARY SENTINEL NODE BIOPSY;  Surgeon: Christene Lye, MD;  Location: [facility] ORS;  Service: General;  Laterality: Left;  . BREAST BIOPSY  Aug 2000   on tamoxifen, atypical hyperplasia  . BREAST BIOPSY Left 04/2014  . BREAST SURGERY Left Aug 2000   lumpectomy/ Dr Sharlet Salina  . BREAST SURGERY Bilateral 05/23/14   Mastectomy  . BUNIONECTOMY Right   . CARDIAC CATHETERIZATION N/A 09/05/2014   Procedure: Left Heart Cath and Coronary Angiography;  Surgeon: Wellington Hampshire, MD;  Location: [place] [facility];  Service: Cardiovascular;  Laterality: N/A;  . CARDIAC CATHETERIZATION  09-05-14  . CATARACT EXTRACTION W/ INTRAOCULAR LENS IMPLANT Bilateral   . CESAREAN SECTION  1996/1997   placenta previa, gest DM, pre-eclampsia  . CHOLECYSTECTOMY  1997   adhesions also  . COLONOSCOPY  01/2001   Ulcerative colitis  . COLONOSCOPY  04/2004   UC, polyp  . COLONOSCOPY  12/08   UC, no polyps  . DEXA  04/1999 and 2010   normal  . ESOPHAGOGASTRODUODENOSCOPY  09/2001   polyp  . exercise stress test  11/2003   negative  . FEMUR FRACTURE SURGERY Left   . MASTECTOMY Bilateral   . MECKEL DIVERTICULUM EXCISION  1999  . NASAL SINUS SURGERY  05/1997  . nuclear stress test  06/2007   negative  . precancerous mole removed    . SIMPLE MASTECTOMY WITH AXILLARY SENTINEL NODE BIOPSY Bilateral 05/23/2014   Procedure: Bilateral simple mastectomy, left sentinel node biopsy ;  Surgeon: Christene Lye, MD;  Location: [facility] ORS;  Service: General;  Laterality: Bilateral;  . Sleep study  11/09   no apnea, but did snore (done by [facility])  . TONSILLECTOMY  1984  . TUBAL LIGATION    . WISDOM TOOTH EXTRACTION  1980's    Family History  Problem Relation Age of Onset  . Coronary artery disease Father   . Colon cancer Father   . Alzheimer's disease Father   . Dementia Father   . Colon polyps Father   . Coronary artery disease Mother   . Hypertension  Mother   . Osteoporosis Mother   . Crohn's disease Mother        crohns colitis  . Breast cancer Other        great aunts  . Coronary artery disease Other        Uncle (also AAA)  . Diabetes Other        remote family history  . Stroke Cousin   . Esophageal cancer Neg Hx   . Rectal cancer Neg Hx   . Stomach cancer Neg Hx     Social history:  reports that she quit smoking about 26 years ago. Her smoking use  included cigarettes. She has a 1.25 pack-year smoking history. She has never used smokeless tobacco. She reports that she does not drink alcohol or use drugs.  Medications:  Prior to Admission medications   Medication Sig Start Date End Date Taking? Authorizing Provider  amantadine (SYMMETREL) 100 MG capsule Take 100 mg by mouth 2 (two) times daily.   Yes [provider]  atorvastatin (LIPITOR) 80 MG tablet TAKE 1 TABLET(80 MG) BY MOUTH DAILY 01/16/18  Yes Gollan, Kathlene November, MD  B-D UF III MINI PEN NEEDLES 31G X 5 MM MISC USE FOR INJECTIONS TWICE DAILY 05/05/18  Yes Elayne Snare, MD  BAYER MICROLET LANCETS lancets USE AS DIRECTED TO CHECK BLOOD SUGAR TWICE DAILY 05/20/16  Yes Elayne Snare, MD  Blood Glucose Monitoring Suppl (CONTOUR NEXT EZ MONITOR) w/Device KIT USE AS DIRECTED TO CHECK BLOOD SUGAR TWICE DAILY 05/20/16  Yes Elayne Snare, MD  Calcium Carb-Cholecalciferol (CALCIUM PLUS VITAMIN D3) 600-800 MG-UNIT TABS Take 1 tablet by mouth daily.   Yes [provider]  canagliflozin (INVOKANA) 300 MG TABS tablet TAKE 1 TABLET(300 MG) BY MOUTH DAILY BEFORE BREAKFAST 12/30/18  Yes Elayne Snare, MD  clopidogrel (PLAVIX) 75 MG tablet TAKE 1 TABLET(75 MG) BY MOUTH DAILY 02/04/19  Yes Gollan, Kathlene November, MD  co-enzyme Q-10 50 MG capsule Take 50 mg by mouth daily.   Yes [provider]  Cyanocobalamin (VITAMIN B-12 PO) 3000 mcg-- 2 gummies daily   Yes [provider]  diazepam (VALIUM) 10 MG tablet Take 2.5-10 mg by mouth as needed for anxiety.   Yes [provider]  diltiazem (CARDIZEM CD) 240 MG 24 hr capsule TAKE 1 CAPSULE(240 MG) BY MOUTH DAILY 12/25/18  Yes Gollan, Kathlene November, MD  diltiazem (CARDIZEM) 30 MG tablet Take 1 tablet (30 mg) by mouth up to three times a day as needed for a blood pressure > 150 (top number) 10/28/18  Yes Gollan, Kathlene November, MD  diphenoxylate-atropine (LOMOTIL) 2.5-0.025 MG tablet Take 1-2 tabs daily as needed for diarrhea 12/26/17  Yes Milus Banister, MD  famotidine (PEPCID) 40 MG tablet Take 40 mg by mouth 2 (two) times daily.   Yes [provider]  FIBER SELECT GUMMIES PO Take 3 capsules by mouth daily.   Yes [provider]  gabapentin (NEURONTIN) 300 MG capsule TAKE 1 CAPSULE(300 MG) BY MOUTH THREE TIMES DAILY 11/05/18  Yes Tower, Marne A, MD  glucose blood (BAYER CONTOUR NEXT TEST) test strip USE AS DIRECTED TO CHECK BLOOD SUGAR TWICE DAILY 06/09/18  Yes Elayne Snare, MD  Insulin Detemir (LEVEMIR FLEXTOUCH) 100 UNIT/ML Pen Inject 50 units under the skin once daily at night. May reduce by 4 units if fast BS is below 100. Patient taking differently: 52 Units. Inject 52 units under the skin once daily at night. May reduce by 4 units if fast BS is below 100. 10/01/18  Yes Elayne Snare, MD  Insulin Pen Needle (B-D UF III MINI PEN NEEDLES) 31G X 5 MM MISC USE FOR INJECTIONS TWICE DAILY 05/05/17  Yes Elayne Snare, MD  lamoTRIgine (LAMICTAL) 200 MG tablet Take 400 mg by mouth daily.   Yes [provider]  latanoprost (XALATAN) 0.005 % ophthalmic solution Place 1 drop into both eyes at bedtime as needed.  10/12/10  Yes [provider]  letrozole (FEMARA) 2.5 MG tablet TAKE 1 TABLET(2.5 MG) BY MOUTH DAILY 03/11/18  Yes Cammie Sickle, MD  linaclotide (LINZESS) 290 MCG CAPS capsule Take 1 capsule (290 mcg total)  by mouth daily before breakfast. 05/05/18  Yes Irene Shipper, MD  lithium carbonate (ESKALITH) 450 MG CR tablet Take 900 mg by mouth at bedtime.   Yes [provider]   Melatonin 10 MG CAPS Take 1 capsule by mouth at bedtime.   Yes [provider]  mesalamine (LIALDA) 1.2 g EC tablet Take 2 tablets (2.4 g total) by mouth daily. 05/05/18  Yes Irene Shipper, MD  Multiple Vitamin (MULTIVITAMIN) tablet Take 1 tablet by mouth daily.     Yes [provider]  Omega-3 Krill Oil 500 MG CAPS Take 500 mg by mouth.   Yes [provider]  ondansetron (ZOFRAN-ODT) 4 MG disintegrating tablet Take 1 tablet (4 mg total) by mouth every 6 (six) hours as needed for nausea or vomiting. 12/22/18  Yes Irene Shipper, MD  OZEMPIC, 1 MG/DOSE, 2 MG/1.5ML SOPN INJECT 1 MG UNDER THE SKIN ONCE A WEEK 11/20/18  Yes Elayne Snare, MD  pantoprazole (PROTONIX) 40 MG tablet TAKE 1 TABLET(40 MG) BY MOUTH TWICE DAILY 05/28/18  Yes Irene Shipper, MD  Probiotic Product (PROBIOTIC DAILY PO) Take by mouth.   Yes [provider]  QUEtiapine (SEROQUEL XR) 400 MG 24 hr tablet Take 800 mg by mouth at bedtime.   Yes [provider]  Simethicone (GAS-X PO) Take 1 tablet by mouth 4 (four) times daily.   Yes [provider]  solifenacin (VESICARE) 5 MG tablet Take 1 tablet by mouth daily. 02/05/18  Yes [provider]  SYNTHROID 200 MCG tablet Take 1 tablet by mouth x 6 days and 2 tablets by mouth on day 7 11/20/18  Yes Elayne Snare, MD  triamcinolone (KENALOG) 0.1 % paste Use as directed 1 application in the mouth or throat 2 (two) times daily. To affected areas 11/05/17  Yes Tower, Wynelle Fanny, MD  valACYclovir (VALTREX) 1000 MG tablet TAKE 1 TABLET(1000 MG) BY MOUTH DAILY 08/19/18  Yes Tower, Wynelle Fanny, MD  vitamin C (ASCORBIC ACID) 500 MG tablet Take 250 mg by mouth daily. Takes 2 a day   Yes [provider]  VYVANSE 60 MG capsule Take 60 mg by mouth every morning.  10/05/17  Yes [provider]  zaleplon (SONATA) 10 MG capsule Take 20 mg by mouth at bedtime as needed for sleep.   Yes [provider]  metoprolol succinate (TOPROL-XL) 50  MG 24 hr tablet Take 25 mg by mouth daily.  10/19/17   [provider]  nitroGLYCERIN (NITROSTAT) 0.4 MG SL tablet Place 1 tablet (0.4 mg total) under the tongue every 5 (five) minutes as needed for chest pain. 10/28/18 01/26/19  Minna Merritts, MD      Allergies  Allergen Reactions  . Ephedrine Other (See Comments)    Pt becomes hyper  . Aspartame And Phenylalanine Nausea Only  . Aspirin     REACTION: aggrivates colitis  . Crestor [Rosuvastatin]     REACTION: increased lfts  . Erythromycin     REACTION: GI upset  . Nsaids     REACTION: aggrivate colitis  . Oxycodone Other (See Comments)    Panic Attack    ROS:  Out of a complete 14 system review of symptoms, the patient complains only of the following symptoms, and all other reviewed systems are negative.  Memory problems Walking difficulty Tremors Chest pain Moles Blurred vision Diarrhea, constipation Urination problems Aching muscles Easy bruising Tremor Depression, anxiety, not enough sleep, hallucinations, racing thoughts Insomnia  Blood pressure Marland Kitchen)  137/91, pulse (!) 118, temperature 97.6 F (36.4 C), temperature source Oral, height 5' 6.5" (1.689 m), weight 204 lb (92.5 kg), last menstrual period 08/23/2006.  Physical Exam  General: The patient is alert and cooperative at the time of the examination.  The patient is markedly obese.  Eyes: Pupils are equal, round, and reactive to light. Discs are flat bilaterally.  Neck: The neck is supple, no carotid bruits are noted.  Respiratory: The respiratory examination is clear.  Cardiovascular: The cardiovascular examination reveals a regular rate and rhythm, no obvious murmurs or rubs are noted.  Skin: Extremities are with 2-3+ edema below the knees bilaterally.  Neurologic Exam  Mental status: The patient is alert and oriented x 3 at the time of the examination. The Mini-Mental status examination done today shows a total score 27/30.  Cranial  nerves: Facial symmetry is present. There is good sensation of the face to pinprick and soft touch bilaterally. The strength of the facial muscles and the muscles to head turning and shoulder shrug are normal bilaterally. Speech is well enunciated, no aphasia or dysarthria is noted. Extraocular movements are full. Visual fields are full. The tongue is midline, and the patient has symmetric elevation of the soft palate. No obvious hearing deficits are noted.  Motor: The motor testing reveals 5 over 5 strength of all 4 extremities. Good symmetric motor tone is noted throughout.  Sensory: Sensory testing is intact to pinprick, soft touch, vibration sensation, and position sense on all 4 extremities, with exception of some stocking pattern pinprick sensory deficit in the distal one third of the lower extremities bilaterally. No evidence of extinction is noted.  Coordination: Cerebellar testing reveals good finger-nose-finger and heel-to-shin bilaterally.  Minimal tremor seen with finger-nose-finger bilaterally, the patient may occasionally have some slight resting type tremor.  There is minimal tremor translated into handwriting when drawing a spiral.  Gait and station: The patient is able to ambulate independently, there is some decreased arm swing bilaterally.  Tandem gait is normal. Romberg is negative. No drift is seen.  Reflexes: Deep tendon reflexes are symmetric, but are depressed bilaterally. Toes are downgoing bilaterally.   Assessment/Plan:  1.  Memory disturbance  2.  Gait disturbance  The patient reports a sudden onset of memory and gait alteration that occurred in early November 2020.  She has had gradual improvement in her symptoms since that time.  The patient does have multiple risk factors for cerebrovascular disease, the description of the deficits with sudden onset could be consistent with a stroke like episode.  We will set the patient up for MRI of the brain.  If a new stroke is  apparent, further cerebrovascular work-up will be undertaken.  Otherwise, the patient be followed over time for her memory issues.  She has already had blood work to include a B12 level and a thyroid profile.  Her most recent hemoglobin A1c was 6.5 in September 2020.  She will follow-up here in 6 months.  Jill Alexanders MD 02/05/2019 11:59 AM  [facility] [address] [place], [place] [address]  Phone [phone] Fax [phone]

## 2019-02-05 NOTE — Progress Notes (Signed)
Patient ID: Lindsay Fry, female   DOB: [birth year], 59 y.o.   MRN: [id number]   Reason for Appointment:  Hypothyroidism and diabetes, followup visit  History of Present Illness:  DIABETES:  Prior history: With her high A1c of 6.7 in 03/2014 she had an abnormal glucose tolerance test indicating diabetes and 1 hour glucose of 300 She was started on metformin; however even with 1500 mg of metformin ER  blood sugars were not controlled especially fasting Subsequently with taking Janumet XR 100/1000 daily her blood sugars were much better Because of her weight gain and A1c going up to 7.3 along with occasional readings over 200 she was started on Victoza in 11/16 She has had progressive hyperglycemia previously and A1c was up to 8.6% in 4/17  She was started on Levemir insulin in 08/2015 because of progressive rise in blood sugars and inability to control with 3 other agents   Non-insulin hypoglycemic drugs: Ozempic 1 mg weekly; metformin ER 2000 mg daily, Invokana 300 mg daily  INSULIN regimen: Levemir 51 units at bedtime  A1c is improved at 6.2  Current management, blood sugar patterns and problems identified:  She has not been seen in follow-up since 9/20  She forgets to check readings after meals and only before her first meal which is at variable times  Although she has a little variability average blood sugars are fairly good at home and they are slightly lower on average than before  Renal function still normal with Invokana  She still has no consistent diet with sometimes eating higher fat meals although not every day  She is not always avoiding regular soft drinks  Because of winter weather and not being able to go to the pool she is not exercising and only a little walking  However her weight has stabilized   GLUCOSE readings from review of the CONTOUR monitor download and averages for 30 days:   PRE-MEAL Fasting Lunch Dinner Bedtime Overall  Glucose  range: 91-157      Mean/median:  123       Previously  PRE-MEAL Fasting Lunch Dinner Bedtime Overall  Glucose range:  109-197   116    Mean/median: 135    136   POST-MEAL PC Breakfast PC Lunch PC Dinner  Glucose range:   143  146  Mean/median:       Weight history:  Wt Readings from Last 3 Encounters:  02/05/19 204 lb 3.2 oz (92.6 kg)  02/05/19 204 lb (92.5 kg)  12/16/18 204 lb 8 oz (92.8 kg)   Previous consultation with dietitian: 2016    LABS:  Lab Results  Component Value Date   HGBA1C 6.2 (A) 02/05/2019   HGBA1C 6.5 09/28/2018   HGBA1C 6.2 03/31/2018   Lab Results  Component Value Date   MICROALBUR 2.2 (H) 03/31/2018   [place] 79 09/28/2018   CREATININE 0.86 12/16/2018   HYPOTHYROIDISM: This was first diagnosed in 1992 after her treatment for Graves' disease with I-131 She has been on relatively large doses of thyroxine supplements She was on 224 mcg since 11/13 and her TSH was normal in 3/14 Subjectively difficult to assess her thyroid since she tends to have fatigue chronically.  In 2014 her dose was reduced to 200 mcg daily and her dose has been fluctuating since then She had required periodic increase in dosage including in 05/2014 when her TSH was 20  She is  on 200 g dosage, on the brand name Synthroid On the last  visit because of her TSH of 14.5 she was told to take additional 200 mcg weekly  Does not complain of any unusual fatigue recently Overall felt better after dosage increase on the last visit Also no shakiness or palpitations  She has been consistently regular with the Synthroid every day before breakfast with water         TSH which was high is back to normal is now although down to only 0.54  Lab Results  Component Value Date   TSH 0.54 02/02/2019   TSH 3.15 12/16/2018   TSH 14.49 (H) 09/28/2018   FREET4 0.85 02/02/2019   FREET4 0.71 09/28/2018   FREET4 0.69 03/31/2018     Office Visit on 02/05/2019  Component Date Value Ref  Range Status  . Hemoglobin A1C 02/05/2019 6.2* 4.0 - 5.6 % Final  Lab on 02/02/2019  Component Date Value Ref Range Status  . TSH 02/02/2019 0.54  0.35 - 4.50 uIU/mL Final  . Free T4 02/02/2019 0.85  0.60 - 1.60 ng/dL Final   Comment: Specimens from patients who are undergoing biotin therapy and /or ingesting biotin supplements may contain high levels of biotin.  The higher biotin concentration in these specimens interferes with this Free T4 assay.  Specimens that contain high levels  of biotin may cause false high results for this Free T4 assay.  Please interpret results in light of the total clinical presentation of the patient.      Allergies as of 02/05/2019      Reactions   Ephedrine Other (See Comments)   Pt becomes hyper   Aspartame And Phenylalanine Nausea Only   Aspirin    REACTION: aggrivates colitis   Crestor [rosuvastatin]    REACTION: increased lfts   Erythromycin    REACTION: GI upset   Nsaids    REACTION: aggrivate colitis   Oxycodone Other (See Comments)   Panic Attack      Medication List       Accurate as of February 05, 2019  3:01 PM. If you have any questions, ask your nurse or doctor.        amantadine 100 MG capsule Commonly known as: SYMMETREL Take 100 mg by mouth 2 (two) times daily.   atorvastatin 80 MG tablet Commonly known as: LIPITOR TAKE 1 TABLET(80 MG) BY MOUTH DAILY   Bayer Microlet Lancets lancets USE AS DIRECTED TO CHECK BLOOD SUGAR TWICE DAILY   Calcium Plus Vitamin D3 600-800 MG-UNIT Tabs Generic drug: Calcium Carb-Cholecalciferol Take 1 tablet by mouth daily.   canagliflozin 300 MG Tabs tablet Commonly known as: Invokana TAKE 1 TABLET(300 MG) BY MOUTH DAILY BEFORE BREAKFAST   clopidogrel 75 MG tablet Commonly known as: PLAVIX TAKE 1 TABLET(75 MG) BY MOUTH DAILY   co-enzyme Q-10 50 MG capsule Take 50 mg by mouth daily.   CONTOUR NEXT EZ MONITOR w/Device Kit USE AS DIRECTED TO CHECK BLOOD SUGAR TWICE DAILY   diazepam 10  MG tablet Commonly known as: VALIUM Take 2.5-10 mg by mouth as needed for anxiety.   diltiazem 240 MG 24 hr capsule Commonly known as: CARDIZEM CD TAKE 1 CAPSULE(240 MG) BY MOUTH DAILY   diltiazem 30 MG tablet Commonly known as: CARDIZEM Take 1 tablet (30 mg) by mouth up to three times a day as needed for a blood pressure > 150 (top number)   diphenoxylate-atropine 2.5-0.025 MG tablet Commonly known as: Lomotil Take 1-2 tabs daily as needed for diarrhea   famotidine 40 MG tablet Commonly known as:  PEPCID Take 40 mg by mouth 2 (two) times daily.   FIBER SELECT GUMMIES PO Take 3 capsules by mouth daily.   gabapentin 300 MG capsule Commonly known as: NEURONTIN TAKE 1 CAPSULE(300 MG) BY MOUTH THREE TIMES DAILY   GAS-X PO Take 1 tablet by mouth 4 (four) times daily.   glucose blood test strip Commonly known as: Visual merchandiser Next Test USE AS DIRECTED TO CHECK BLOOD SUGAR TWICE DAILY   Insulin Pen Needle 31G X 5 MM Misc Commonly known as: B-D UF III MINI PEN NEEDLES USE FOR INJECTIONS TWICE DAILY   B-D UF III MINI PEN NEEDLES 31G X 5 MM Misc Generic drug: Insulin Pen Needle USE FOR INJECTIONS TWICE DAILY   lamoTRIgine 200 MG tablet Commonly known as: LAMICTAL Take 400 mg by mouth daily.   latanoprost 0.005 % ophthalmic solution Commonly known as: XALATAN Place 1 drop into both eyes at bedtime as needed.   letrozole 2.5 MG tablet Commonly known as: FEMARA TAKE 1 TABLET(2.5 MG) BY MOUTH DAILY   Levemir FlexTouch 100 UNIT/ML Pen Generic drug: Insulin Detemir Inject 50 units under the skin once daily at night. May reduce by 4 units if fast BS is below 100. What changed:   how much to take  additional instructions   linaclotide 290 MCG Caps capsule Commonly known as: LINZESS Take 1 capsule (290 mcg total) by mouth daily before breakfast.   lithium carbonate 450 MG CR tablet Commonly known as: ESKALITH Take 900 mg by mouth at bedtime.   Melatonin 10 MG Caps  Take 1 capsule by mouth at bedtime.   mesalamine 1.2 g EC tablet Commonly known as: LIALDA Take 2 tablets (2.4 g total) by mouth daily.   metoprolol succinate 50 MG 24 hr tablet Commonly known as: TOPROL-XL Take 25 mg by mouth daily.   multivitamin tablet Take 1 tablet by mouth daily.   nitroGLYCERIN 0.4 MG SL tablet Commonly known as: NITROSTAT Place 1 tablet (0.4 mg total) under the tongue every 5 (five) minutes as needed for chest pain.   Omega-3 Krill Oil 500 MG Caps Take 500 mg by mouth.   ondansetron 4 MG disintegrating tablet Commonly known as: ZOFRAN-ODT Take 1 tablet (4 mg total) by mouth every 6 (six) hours as needed for nausea or vomiting.   Ozempic (1 MG/DOSE) 2 MG/1.5ML Sopn Generic drug: Semaglutide (1 MG/DOSE) INJECT 1 MG UNDER THE SKIN ONCE A WEEK   pantoprazole 40 MG tablet Commonly known as: PROTONIX TAKE 1 TABLET(40 MG) BY MOUTH TWICE DAILY   PROBIOTIC DAILY PO Take by mouth.   QUEtiapine 400 MG 24 hr tablet Commonly known as: SEROQUEL XR Take 800 mg by mouth at bedtime.   solifenacin 5 MG tablet Commonly known as: VESICARE Take 1 tablet by mouth daily.   Synthroid 200 MCG tablet Generic drug: levothyroxine Take 1 tablet by mouth x 6 days and 2 tablets by mouth on day 7   triamcinolone 0.1 % paste Commonly known as: KENALOG Use as directed 1 application in the mouth or throat 2 (two) times daily. To affected areas   valACYclovir 1000 MG tablet Commonly known as: VALTREX TAKE 1 TABLET(1000 MG) BY MOUTH DAILY   VITAMIN B-12 PO 3000 mcg-- 2 gummies daily   vitamin C 500 MG tablet Commonly known as: ASCORBIC ACID Take 250 mg by mouth daily. Takes 2 a day   Vyvanse 60 MG capsule Generic drug: lisdexamfetamine Take 60 mg by mouth every morning.   zaleplon 10  MG capsule Commonly known as: SONATA Take 20 mg by mouth at bedtime as needed for sleep.       Allergies:  Allergies  Allergen Reactions  . Ephedrine Other (See Comments)     Pt becomes hyper  . Aspartame And Phenylalanine Nausea Only  . Aspirin     REACTION: aggrivates colitis  . Crestor [Rosuvastatin]     REACTION: increased lfts  . Erythromycin     REACTION: GI upset  . Nsaids     REACTION: aggrivate colitis  . Oxycodone Other (See Comments)    Panic Attack    Past Medical History:  Diagnosis Date  . ADD (attention deficit disorder) 11/19/2012  . Allergic rhinitis, cause unspecified   . Anxiety state, unspecified   . Arthritis   . Asymptomatic postmenopausal status (age-related) (natural)   . Atypical hyperplasia of left breast 2000  . Benign neoplasm of other and unspecified site of the digestive system   . Bipolar disorder ([place])   . Breast cancer ([place])    Left Breast 24m invasive CA left uiq, dcis left uoq and a lot ADH, ALH in both breasts.  .Marland KitchenCAD (coronary artery disease)    a. 08/2014 NSTEMI/Cath: LM nl, LAD 50p, D1/D2 min irregs, LCX nl, OM1 40, LPDA nl, RI 99ost (2.068mvessel->med Rx), RCA nl, AM 60, nl EF; b. 11/2016 MV: EF 74%, no ischemia (performed 2/2 dyspnea & new inf TWI).  . Marland Kitchenarcinoma of left breast (HCM[place]5/19/2016  . Cataract   . Chronic diastolic CHF (congestive heart failure) ([facility]   a. 08/2014 Echo: EF 60-65%, Gr 1 DD, nl LA.  . Marland Kitchenlotting disorder ([facility]   on blood thiner, Plavix  . Cyst    on Achilles tendon  . Depression   . Diabetes mellitus ([facility]   frank  . Dysuria   . Edema   . Family history of malignant neoplasm of gastrointestinal tract   . Fibromyalgia   . GERD (gastroesophageal reflux disease)   . Glaucoma    pre glaucoma, 05/14/2018 pt. denies glaucoma  . Headache    migraines  . Hiatal hernia   . History of alcoholism ([place]  . History of migraines   . History of ovarian cyst   . Hx of adenomatous colonic polyps   . Insomnia, unspecified   . Myocardial infarction ([facility]8-[place]16  . OCD (obsessive compulsive disorder)   . Osteopenia 09/25/2016   Femoral neck T -1.1  9/18  . Other screening mammogram    . Pure hypercholesterolemia   . Rosacea   . Subacute confusional state 11/19/2012  . Tubular adenoma of colon 01/10/11  . Ulcerative colitis, unspecified   . Unspecified asthma(493.90)   . Unspecified essential hypertension   . Unspecified hypothyroidism   . Unspecified vitamin D deficiency   . Vertigo     Past Surgical History:  Procedure Laterality Date  . APPENDECTOMY    . AXILLARY SENTINEL NODE BIOPSY Left 05/23/2014   Procedure: AXILLARY SENTINEL NODE BIOPSY;  Surgeon: SeChristene LyeMD;  Location: [facility]S;  Service: General;  Laterality: Left;  . BREAST BIOPSY  Aug 2000   on tamoxifen, atypical hyperplasia  . BREAST BIOPSY Left 04/2014  . BREAST SURGERY Left Aug 2000   lumpectomy/ Dr CrSharlet [place]. BREAST SURGERY Bilateral 05/23/14   Mastectomy  . BUNIONECTOMY Right   . CARDIAC CATHETERIZATION N/A 09/05/2014   Procedure: Left Heart Cath and Coronary Angiography;  Surgeon: MuWellington HampshireMD;  Location: [place] [facility];  Service: Cardiovascular;  Laterality: N/A;  . CARDIAC CATHETERIZATION  09-05-14  . CATARACT EXTRACTION W/ INTRAOCULAR LENS IMPLANT Bilateral   . CESAREAN SECTION  1996/1997   placenta previa, gest DM, pre-eclampsia  . CHOLECYSTECTOMY  1997   adhesions also  . COLONOSCOPY  01/2001   Ulcerative colitis  . COLONOSCOPY  04/2004   UC, polyp  . COLONOSCOPY  12/08   UC, no polyps  . DEXA  04/1999 and 2010   normal  . ESOPHAGOGASTRODUODENOSCOPY  09/2001   polyp  . exercise stress test  11/2003   negative  . FEMUR FRACTURE SURGERY Left   . MASTECTOMY Bilateral   . MECKEL DIVERTICULUM EXCISION  1999  . NASAL SINUS SURGERY  05/1997  . nuclear stress test  06/2007   negative  . precancerous mole removed    . SIMPLE MASTECTOMY WITH AXILLARY SENTINEL NODE BIOPSY Bilateral 05/23/2014   Procedure: Bilateral simple mastectomy, left sentinel node biopsy ;  Surgeon: Christene Lye, MD;  Location: [facility] ORS;  Service: General;  Laterality: Bilateral;  .  Sleep study  11/09   no apnea, but did snore (done by [facility])  . TONSILLECTOMY  1984  . TUBAL LIGATION    . WISDOM TOOTH EXTRACTION  1980's    Family History  Problem Relation Age of Onset  . Coronary artery disease Father   . Colon cancer Father   . Alzheimer's disease Father   . Dementia Father   . Colon polyps Father   . Coronary artery disease Mother   . Hypertension Mother   . Osteoporosis Mother   . Crohn's disease Mother        crohns colitis  . Breast cancer Other        great aunts  . Coronary artery disease Other        Uncle (also AAA)  . Diabetes Other        remote family history  . Stroke Cousin   . Esophageal cancer Neg Hx   . Rectal cancer Neg Hx   . Stomach cancer Neg Hx     Social History:  reports that she quit smoking about 26 years ago. Her smoking use included cigarettes. She has a 1.25 pack-year smoking history. She has never used smokeless tobacco. She reports that she does not drink alcohol or use drugs.  REVIEW Of SYSTEMS:   HYPERCALCEMIA: Her calcium has been previously high, upper normal subsequently PTH in the past has been borderline for hyperparathyroidism and is last below 20  She is taking OTC vitamin D3, ? 2000 units and has been on calcium by her oncologist   Bone density in 9/15 shows T score -1.1 at the hip  Lab Results  Component Value Date   PTH 18 10/25/2016   CALCIUM 9.8 12/16/2018   PHOS 2.3 01/29/2007     Lab Results  Component Value Date   CALCIUM 9.8 12/16/2018   PHOS 2.3 01/29/2007   Lab Results  Component Value Date   VD25OH 38.76 09/28/2018   VD25OH 24.75 (L) 02/06/2017   VD25OH 44 05/12/2013     HYPERTENSION: Blood pressure is controlled, managed by cardiologist.   on Bystolic twice a day and diltiazem and also on Invokana  Lab Results  Component Value Date   CREATININE 0.86 12/16/2018   BUN 7 12/16/2018   NA 139 12/16/2018   K 3.8 12/16/2018   CL 100 12/16/2018   CO2 26 12/16/2018  History of hypercholesterolemia: Management by cardiologist and has been on 80 mg total since her MI LDL still above 70  She thinks she is regular with her atorvastatin 80 mg dose   She does not consistently watch saturated fat with eating hamburgers , cheese despite reminders  Lab Results  Component Value Date   CHOL 151 09/28/2018   HDL 45.40 09/28/2018   LDLCALC 79 09/28/2018   LDLDIRECT 67.0 11/10/2015   TRIG 135.0 09/28/2018   CHOLHDL 3 09/28/2018   Possible fatty liver: Liver functions are recently back to normal   Lab Results  Component Value Date   ALT 24 12/16/2018   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   Examination:   BP 124/70   Pulse 100   Ht 5' 6.5" (1.689 m)   Wt 204 lb 3.2 oz (92.6 kg)   LMP 08/23/2006 (Approximate) Comment: tubal ligation  SpO2 95%   BMI 32.46 kg/m       Assessments   DIABETES with obesity:  See history of present illness for evaluation of  current management, blood sugar patterns and problems identified  Her A1c is 6.2, previously 6.5  She is on a regimen of basal insulin, Invokana and Ozempic  Blood sugars are being checked mostly in the morning and not clear if she has high postprandial readings This may be reflected by variable readings in the mornings also which are as low as 91 in the lab Still not consistently restricting drinks with sugar and higher fat meals Despite not exercising her blood sugars better controlled and she has maintained her weight  Discussed need to cut back on higher calorie foods and drinks to help with consistent weight loss Also encouraged her to exercise as much as possible including water exercises especially after she is able to get her Covid vaccine  Hypothyroidism, post ablative:  TSH which was significantly above normal with 200 mcg levothyroxine once a day is improved with additional 200 mcg daily but now TSH trending low normal She will reduce the dose to  7-1/2 tablets a week We will check her labs again next visit She does take PPI drugs but this has not interfered with abduction previously Reminded her to take it consistently before eating in the morning  LIPIDS: LDL is higher than the target of 70 and may be partly related to diet We will check this again today Will consider adding Zetia if LDL if still over 70 along with improving diet  Vitamin D deficiency: Last level normal, needs to let us know what dose she is taking  Also no recurrence of hypercalcemia on last visit and will recheck today  Check microalbumin on next visit  Patient Instructions  Check blood sugars on waking up 3-4 days a week  Also check blood sugars about 2 hours after meals and do this after different meals by rotation  Recommended blood sugar levels on waking up are 90-130 and about 2 hours after meal is 130-160  Please bring your blood sugar monitor to each visit, thank you       .  Elayne Snare 02/05/2019, 3:01 PM   Note: This office note was prepared with Dragon voice recognition system technology. Any transcriptional errors that result from this process are unintentional.  Addendum: LDL 74, will add Zetia 10 mg daily Kidney function normal

## 2019-02-07 MED ORDER — EZETIMIBE 10 MG PO TABS: 10.0000 mg | ORAL_TABLET | Freq: Every day | ORAL | 3 refills | Status: DC

## 2019-02-10 ENCOUNTER — Ambulatory Visit (INDEPENDENT_AMBULATORY_CARE_PROVIDER_SITE_OTHER): Payer: BC Managed Care – PPO | Admitting: Psychology

## 2019-02-10 DIAGNOSIS — F3131 Bipolar disorder, current episode depressed, mild: Secondary | ICD-10-CM

## 2019-02-11 DIAGNOSIS — Z01419 Encounter for gynecological examination (general) (routine) without abnormal findings: Secondary | ICD-10-CM | POA: Diagnosis not present

## 2019-02-12 DIAGNOSIS — L821 Other seborrheic keratosis: Secondary | ICD-10-CM | POA: Diagnosis not present

## 2019-02-12 DIAGNOSIS — L918 Other hypertrophic disorders of the skin: Secondary | ICD-10-CM | POA: Diagnosis not present

## 2019-02-12 DIAGNOSIS — D225 Melanocytic nevi of trunk: Secondary | ICD-10-CM | POA: Diagnosis not present

## 2019-02-17 ENCOUNTER — Telehealth: Payer: Self-pay

## 2019-02-17 NOTE — Telephone Encounter (Signed)
Patient called back to schedule her MRI apt please follow up

## 2019-02-18 ENCOUNTER — Telehealth: Payer: Self-pay | Admitting: Internal Medicine

## 2019-02-18 NOTE — Telephone Encounter (Signed)
Patient is calling to schedule her repeat colonoscopy. Recall states that she is due 04/2020- I let her know but she stated that Dr. Henrene Pastor usually does them every year. I went and looked in the pathology report and it does say repeat colonoscopy in one year. I told her I would check to make sure she was okay to repeat this year in April.

## 2019-02-18 NOTE — Telephone Encounter (Signed)
Pt returned call. Pt requested to be called after noon due to her work schedule. Please advise.

## 2019-02-18 NOTE — Telephone Encounter (Signed)
I called the patient but she did not answer so I left a VM for her to call back. DWD

## 2019-02-19 NOTE — Telephone Encounter (Signed)
Left message for pt to call back. Last procedure report had stated recall colon in 1 year. Path letter sent to pt states correction of recall colon in 2 years with extensive prep, OV recall of 1 year.

## 2019-02-23 NOTE — Telephone Encounter (Signed)
Spoke to patient yesterday and scheduled.

## 2019-02-24 NOTE — Telephone Encounter (Signed)
Left message for pt to call back  °

## 2019-02-24 NOTE — Telephone Encounter (Signed)
Spoke with pt and she is aware of recall date.

## 2019-02-25 ENCOUNTER — Other Ambulatory Visit: Payer: Self-pay | Admitting: *Deleted

## 2019-02-25 ENCOUNTER — Other Ambulatory Visit: Payer: Self-pay

## 2019-02-25 ENCOUNTER — Encounter: Payer: Self-pay | Admitting: Internal Medicine

## 2019-02-25 DIAGNOSIS — Z170 Estrogen receptor positive status [ER+]: Secondary | ICD-10-CM

## 2019-02-25 DIAGNOSIS — C50812 Malignant neoplasm of overlapping sites of left female breast: Secondary | ICD-10-CM

## 2019-02-26 ENCOUNTER — Inpatient Hospital Stay: Payer: BC Managed Care – PPO | Attending: Internal Medicine

## 2019-02-26 ENCOUNTER — Other Ambulatory Visit: Payer: Self-pay

## 2019-02-26 ENCOUNTER — Inpatient Hospital Stay (HOSPITAL_BASED_OUTPATIENT_CLINIC_OR_DEPARTMENT_OTHER): Payer: BC Managed Care – PPO | Admitting: Internal Medicine

## 2019-02-26 DIAGNOSIS — E119 Type 2 diabetes mellitus without complications: Secondary | ICD-10-CM | POA: Diagnosis not present

## 2019-02-26 DIAGNOSIS — E039 Hypothyroidism, unspecified: Secondary | ICD-10-CM | POA: Diagnosis not present

## 2019-02-26 DIAGNOSIS — F319 Bipolar disorder, unspecified: Secondary | ICD-10-CM | POA: Insufficient documentation

## 2019-02-26 DIAGNOSIS — M8580 Other specified disorders of bone density and structure, unspecified site: Secondary | ICD-10-CM | POA: Diagnosis not present

## 2019-02-26 DIAGNOSIS — F419 Anxiety disorder, unspecified: Secondary | ICD-10-CM | POA: Diagnosis not present

## 2019-02-26 DIAGNOSIS — Z833 Family history of diabetes mellitus: Secondary | ICD-10-CM | POA: Insufficient documentation

## 2019-02-26 DIAGNOSIS — I2510 Atherosclerotic heart disease of native coronary artery without angina pectoris: Secondary | ICD-10-CM | POA: Insufficient documentation

## 2019-02-26 DIAGNOSIS — C50812 Malignant neoplasm of overlapping sites of left female breast: Secondary | ICD-10-CM

## 2019-02-26 DIAGNOSIS — Z803 Family history of malignant neoplasm of breast: Secondary | ICD-10-CM | POA: Diagnosis not present

## 2019-02-26 DIAGNOSIS — Z79899 Other long term (current) drug therapy: Secondary | ICD-10-CM | POA: Diagnosis not present

## 2019-02-26 DIAGNOSIS — Z87891 Personal history of nicotine dependence: Secondary | ICD-10-CM | POA: Insufficient documentation

## 2019-02-26 DIAGNOSIS — I110 Hypertensive heart disease with heart failure: Secondary | ICD-10-CM | POA: Insufficient documentation

## 2019-02-26 DIAGNOSIS — Z8249 Family history of ischemic heart disease and other diseases of the circulatory system: Secondary | ICD-10-CM | POA: Insufficient documentation

## 2019-02-26 DIAGNOSIS — Z794 Long term (current) use of insulin: Secondary | ICD-10-CM | POA: Diagnosis not present

## 2019-02-26 DIAGNOSIS — Z170 Estrogen receptor positive status [ER+]: Secondary | ICD-10-CM | POA: Insufficient documentation

## 2019-02-26 DIAGNOSIS — F429 Obsessive-compulsive disorder, unspecified: Secondary | ICD-10-CM | POA: Insufficient documentation

## 2019-02-26 DIAGNOSIS — I5032 Chronic diastolic (congestive) heart failure: Secondary | ICD-10-CM | POA: Insufficient documentation

## 2019-02-26 DIAGNOSIS — Z8262 Family history of osteoporosis: Secondary | ICD-10-CM | POA: Diagnosis not present

## 2019-02-26 DIAGNOSIS — E7800 Pure hypercholesterolemia, unspecified: Secondary | ICD-10-CM | POA: Diagnosis not present

## 2019-02-26 DIAGNOSIS — Z7902 Long term (current) use of antithrombotics/antiplatelets: Secondary | ICD-10-CM | POA: Diagnosis not present

## 2019-02-26 DIAGNOSIS — Z9013 Acquired absence of bilateral breasts and nipples: Secondary | ICD-10-CM | POA: Diagnosis not present

## 2019-02-26 DIAGNOSIS — Z79811 Long term (current) use of aromatase inhibitors: Secondary | ICD-10-CM | POA: Diagnosis not present

## 2019-02-26 DIAGNOSIS — I252 Old myocardial infarction: Secondary | ICD-10-CM | POA: Insufficient documentation

## 2019-02-26 DIAGNOSIS — Z800 Family history of malignant neoplasm of digestive organs: Secondary | ICD-10-CM | POA: Diagnosis not present

## 2019-02-26 LAB — COMPREHENSIVE METABOLIC PANEL
ALT: 43 U/L (ref 0–44)
AST: 46 U/L — ABNORMAL HIGH (ref 15–41)
Albumin: 4.5 g/dL (ref 3.5–5.0)
Alkaline Phosphatase: 80 U/L (ref 38–126)
Anion gap: 11 (ref 5–15)
BUN: 10 mg/dL (ref 6–20)
CO2: 23 mmol/L (ref 22–32)
Calcium: 9.7 mg/dL (ref 8.9–10.3)
Chloride: 105 mmol/L (ref 98–111)
Creatinine, Ser: 0.77 mg/dL (ref 0.44–1.00)
GFR calc Af Amer: >60 mL/min (ref >60)
GFR calc non Af Amer: >60 mL/min (ref >60)
Glucose, Bld: 174 mg/dL — ABNORMAL HIGH (ref 70–99)
Potassium: 3.6 mmol/L (ref 3.5–5.1)
Sodium: 139 mmol/L (ref 135–145)
Total Bilirubin: 0.8 mg/dL (ref 0.3–1.2)
Total Protein: 7.1 g/dL (ref 6.5–8.1)

## 2019-02-26 LAB — CBC WITH DIFFERENTIAL/PLATELET
Abs Immature Granulocytes: 0.04 10*3/uL (ref 0.00–0.07)
Basophils Absolute: 0.1 10*3/uL (ref 0.0–0.1)
Basophils Relative: 1 %
Eosinophils Absolute: 0.3 10*3/uL (ref 0.0–0.5)
Eosinophils Relative: 3 %
HCT: 48.1 % — ABNORMAL HIGH (ref 36.0–46.0)
Hemoglobin: 15.1 g/dL — ABNORMAL HIGH (ref 12.0–15.0)
Immature Granulocytes: 1 %
Lymphocytes Relative: 24 %
Lymphs Abs: 2 10*3/uL (ref 0.7–4.0)
MCH: 29.3 pg (ref 26.0–34.0)
MCHC: 31.4 g/dL (ref 30.0–36.0)
MCV: 93.2 fL (ref 80.0–100.0)
Monocytes Absolute: 0.6 10*3/uL (ref 0.1–1.0)
Monocytes Relative: 7 %
Neutro Abs: 5.5 10*3/uL (ref 1.7–7.7)
Neutrophils Relative %: 64 %
Platelets: 318 10*3/uL (ref 150–400)
RBC: 5.16 MIL/uL — ABNORMAL HIGH (ref 3.87–5.11)
RDW: 13.7 % (ref 11.5–15.5)
WBC: 8.5 10*3/uL (ref 4.0–10.5)
nRBC: 0 % (ref 0.0–0.2)

## 2019-02-26 NOTE — Progress Notes (Signed)
[facility] OFFICE PROGRESS NOTE  Patient Care Team: Tower, Marne A, MD as PCP - General Gollan, Timothy J, MD as PCP - Cardiology (Cardiology) Kumar, Ajay, MD as Consulting Physician (Endocrinology) Kaur, Rupinder, MD as Consulting Physician (Psychiatry) Rosenow, Philip J, MD (Obstetrics and Gynecology) Sankar, Seeplaputhur G, MD (General Surgery) Gollan, Timothy J, MD as Consulting Physician (Cardiology)  Cancer Staging Carcinoma of left breast (HCC) Staging form: Breast, AJCC 7th Edition - Clinical: Stage IA (T1b, N0, M0) - Unsigned Staging comments: Kidney frequent changes in the 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  No invasive cancer in the right breast.  Status post bilateral mastectomy    Oncology History Overview Note  1.  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  2, April of 2016 patient had  stereotactic biopsy of abnormal breast lesion in the left breastwhich was positive for invasive carcinoma and ductal carcinoma in situ.  Patient underwent bilateral mastectomy  May 9 , 2016  Patient has invasive carcinoma and left breast with significant changes in the right breast ductal carcinoma in situ patient underwent bilateral mastectomy.  Left breast: T1 b N0 M0 stage IB estrogen and progesterone receptor HER-2/neu- Not overexpressed  2.    Multi-gene analysis with  MAMOPRINT low risk for recurrent disease  Patient was started on letrozole and calcium and vitamin D (June, 2016)   # Hx Anxiety/depression [Dr.kaur; [place]]; Hx of colitis [s/p colo- May 2019]; Obesity -------------------------------------------------------------------------------------  # SURVIVORSHIP-P  DIAGNOSIS:LEFT BREAST CA ER-POS  STAGE: I        ;GOALS: curative  CURRENT/MOST RECENT THERAPY: Letrozole    Carcinoma of left breast (HCC)  05/23/2014 Initial Diagnosis   Carcinoma of left  breast   Carcinoma of overlapping sites of left breast in female, estrogen receptor positive (HCC)      INTERVAL HISTORY:  Lindsay Fry 58 y.o.  female pleasant patient above history of stage I breast cancer ER PR positive HER-2 negative currently on letrozole is here for follow-up.  Patient denies any blood in stools or black or stools.  States her diarrhea is well controlled.  Chronic joint pains back pain not any worse.  Review of Systems  Constitutional: Positive for malaise/fatigue. Negative for chills, diaphoresis, fever and weight loss.  HENT: Negative for nosebleeds and sore throat.   Eyes: Negative for double vision.  Respiratory: Negative for cough, hemoptysis, sputum production, shortness of breath and wheezing.   Cardiovascular: Negative for chest pain, palpitations, orthopnea and leg swelling.  Gastrointestinal: Negative for abdominal pain, blood in stool, constipation, heartburn, melena, nausea and vomiting.  Genitourinary: Negative for dysuria, frequency and urgency.  Musculoskeletal: Positive for back pain and joint pain.  Skin: Negative.  Negative for itching and rash.  Neurological: Negative for dizziness, tingling, focal weakness, weakness and headaches.  Endo/Heme/Allergies: Does not bruise/bleed easily.  Psychiatric/Behavioral: Positive for depression. The patient is nervous/anxious and has insomnia.       PAST MEDICAL HISTORY :  Past Medical History:  Diagnosis Date  . ADD (attention deficit disorder) 11/19/2012  . Allergic rhinitis, cause unspecified   . Anxiety state, unspecified   . Arthritis   . Asymptomatic postmenopausal status (age-related) (natural)   . Atypical hyperplasia of left breast 2000  . Benign neoplasm of other and unspecified site of the digestive system   . Bipolar disorder (HCC)   . Breast cancer (HCC)      Left Breast 6mm invasive CA left uiq, dcis left uoq and a lot ADH, ALH in both breasts.  . CAD (coronary artery disease)     a. 08/2014 NSTEMI/Cath: LM nl, LAD 50p, D1/D2 min irregs, LCX nl, OM1 40, LPDA nl, RI 99ost (2.0mm vessel->med Rx), RCA nl, AM 60, nl EF; b. 11/2016 MV: EF 74%, no ischemia (performed 2/2 dyspnea & new inf TWI).  . Carcinoma of left breast (HCC) 06/02/2014  . Cataract   . Chronic diastolic CHF (congestive heart failure) (HCC)    a. 08/2014 Echo: EF 60-65%, Gr 1 DD, nl LA.  . Clotting disorder (HCC)    on blood thiner, Plavix  . Cyst    on Achilles tendon  . Depression   . Diabetes mellitus (HCC)    frank  . Dysuria   . Edema   . Family history of malignant neoplasm of gastrointestinal tract   . Fibromyalgia   . GERD (gastroesophageal reflux disease)   . Glaucoma    pre glaucoma, 05/14/2018 pt. denies glaucoma  . Headache    migraines  . Hiatal hernia   . History of alcoholism (HCC)   . History of migraines   . History of ovarian cyst   . Hx of adenomatous colonic polyps   . Insomnia, unspecified   . Myocardial infarction (HCC) 09-04-14  . OCD (obsessive compulsive disorder)   . Osteopenia 09/25/2016   Femoral neck T -1.1  9/18  . Other screening mammogram   . Pure hypercholesterolemia   . Rosacea   . Subacute confusional state 11/19/2012  . Tubular adenoma of colon 01/10/11  . Ulcerative colitis, unspecified   . Unspecified asthma(493.90)   . Unspecified essential hypertension   . Unspecified hypothyroidism   . Unspecified vitamin D deficiency   . Vertigo     PAST SURGICAL HISTORY :   Past Surgical History:  Procedure Laterality Date  . APPENDECTOMY    . AXILLARY SENTINEL NODE BIOPSY Left 05/23/2014   Procedure: AXILLARY SENTINEL NODE BIOPSY;  Surgeon: Seeplaputhur G Sankar, MD;  Location: [facility] ORS;  Service: General;  Laterality: Left;  . BREAST BIOPSY  Aug 2000   on tamoxifen, atypical hyperplasia  . BREAST BIOPSY Left 04/2014  . BREAST SURGERY Left Aug 2000   lumpectomy/ Dr Crawford  . BREAST SURGERY Bilateral 05/23/14   Mastectomy  . BUNIONECTOMY Right   . CARDIAC  CATHETERIZATION N/A 09/05/2014   Procedure: Left Heart Cath and Coronary Angiography;  Surgeon: Muhammad A Arida, MD;  Location: [facility] INVASIVE CV LAB;  Service: Cardiovascular;  Laterality: N/A;  . CARDIAC CATHETERIZATION  09-05-14  . CATARACT EXTRACTION W/ INTRAOCULAR LENS IMPLANT Bilateral   . CESAREAN SECTION  1996/1997   placenta previa, gest DM, pre-eclampsia  . CHOLECYSTECTOMY  1997   adhesions also  . COLONOSCOPY  01/2001   Ulcerative colitis  . COLONOSCOPY  04/2004   UC, polyp  . COLONOSCOPY  12/08   UC, no polyps  . DEXA  04/1999 and 2010   normal  . ESOPHAGOGASTRODUODENOSCOPY  09/2001   polyp  . exercise stress test  11/2003   negative  . FEMUR FRACTURE SURGERY Left   . MASTECTOMY Bilateral   . MECKEL DIVERTICULUM EXCISION  1999  . NASAL SINUS SURGERY  05/1997  . nuclear stress test  06/2007   negative  . precancerous mole removed    . SIMPLE MASTECTOMY WITH AXILLARY SENTINEL NODE BIOPSY Bilateral 05/23/2014   Procedure: Bilateral simple mastectomy, left sentinel node biopsy ;    Surgeon: Christene Lye, MD;  Location: [facility] ORS;  Service: General;  Laterality: Bilateral;  . Sleep study  11/09   no apnea, but did snore (done by [facility])  . TONSILLECTOMY  1984  . TUBAL LIGATION    . WISDOM TOOTH EXTRACTION  1980's    FAMILY HISTORY :   Family History  Problem Relation Age of Onset  . Coronary artery disease Father   . Colon cancer Father   . Alzheimer's disease Father   . Dementia Father   . Colon polyps Father   . Coronary artery disease Mother   . Hypertension Mother   . Osteoporosis Mother   . Crohn's disease Mother        crohns colitis  . Breast cancer Other        great aunts  . Coronary artery disease Other        Uncle (also AAA)  . Diabetes Other        remote family history  . Stroke Cousin   . Esophageal cancer Neg Hx   . Rectal cancer Neg Hx   . Stomach cancer Neg Hx     SOCIAL HISTORY:   Social History   Tobacco Use  . Smoking status:  Former Smoker    Packs/day: 0.25    Years: 5.00    Pack years: 1.25    Types: Cigarettes    Quit date: 01/14/1993    Years since quitting: 26.1  . Smokeless tobacco: Never Used  Substance Use Topics  . Alcohol use: No    Alcohol/week: 0.0 standard drinks    Comment: Recovered ETOH  . Drug use: No    ALLERGIES:  is allergic to ephedrine; aspartame and phenylalanine; aspirin; crestor [rosuvastatin]; erythromycin; nsaids; and oxycodone.  MEDICATIONS:  Current Outpatient Medications  Medication Sig Dispense Refill  . acetaminophen (TYLENOL) 650 MG CR tablet Take 1,300 mg by mouth 2 (two) times daily.    Marland Kitchen amantadine (SYMMETREL) 100 MG capsule Take 100 mg by mouth 2 (two) times daily.    Marland Kitchen atorvastatin (LIPITOR) 80 MG tablet TAKE 1 TABLET(80 MG) BY MOUTH DAILY 90 tablet 3  . B-D UF III MINI PEN NEEDLES 31G X 5 MM MISC USE FOR INJECTIONS TWICE DAILY 100 each 0  . BAYER MICROLET LANCETS lancets USE AS DIRECTED TO CHECK BLOOD SUGAR TWICE DAILY 200 each 2  . Bioflavonoid Products (VITAMIN C) CHEW Chew 1 tablet by mouth daily.    . Blood Glucose Monitoring Suppl (CONTOUR NEXT EZ MONITOR) w/Device KIT USE AS DIRECTED TO CHECK BLOOD SUGAR TWICE DAILY 1 kit 2  . Calcium Carb-Cholecalciferol (CALCIUM PLUS VITAMIN D3) 600-800 MG-UNIT TABS Take 1 tablet by mouth daily.    . canagliflozin (INVOKANA) 300 MG TABS tablet TAKE 1 TABLET(300 MG) BY MOUTH DAILY BEFORE BREAKFAST 30 tablet 3  . Cholecalciferol (VITAMIN D3) 50 MCG (2000 UT) capsule Take 2,000 Units by mouth 2 (two) times daily.    . clopidogrel (PLAVIX) 75 MG tablet TAKE 1 TABLET(75 MG) BY MOUTH DAILY 90 tablet 2  . co-enzyme Q-10 50 MG capsule Take 50 mg by mouth daily.    . Cyanocobalamin (VITAMIN B-12 PO) 3000 mcg-- 2 gummies daily    . diazepam (VALIUM) 10 MG tablet Take 2.5-10 mg by mouth as needed for anxiety.    Marland Kitchen diltiazem (CARDIZEM CD) 240 MG 24 hr capsule TAKE 1 CAPSULE(240 MG) BY MOUTH DAILY 90 capsule 3  . diltiazem (CARDIZEM) 30  MG tablet Take 1 tablet (  30 mg) by mouth up to three times a day as needed for a blood pressure > 150 (top number) 90 tablet 1  . diphenoxylate-atropine (LOMOTIL) 2.5-0.025 MG tablet Take 1-2 tabs daily as needed for diarrhea 50 tablet 0  . ezetimibe (ZETIA) 10 MG tablet Take 1 tablet (10 mg total) by mouth daily. 90 tablet 3  . famotidine (PEPCID) 40 MG tablet Take 40 mg by mouth 2 (two) times daily.    . FIBER SELECT GUMMIES PO Take 3 capsules by mouth daily.    . gabapentin (NEURONTIN) 300 MG capsule TAKE 1 CAPSULE(300 MG) BY MOUTH THREE TIMES DAILY 90 capsule 3  . glucose blood (BAYER CONTOUR NEXT TEST) test strip USE AS DIRECTED TO CHECK BLOOD SUGAR TWICE DAILY 200 each 2  . Insulin Detemir (LEVEMIR FLEXTOUCH) 100 UNIT/ML Pen Inject 50 units under the skin once daily at night. May reduce by 4 units if fast BS is below 100. (Patient taking differently: 51 Units. Inject 51 units under the skin once daily at night. May reduce by 4 units if fast BS is below 100.) 15 mL 2  . Insulin Pen Needle (B-D UF III MINI PEN NEEDLES) 31G X 5 MM MISC USE FOR INJECTIONS TWICE DAILY 100 each 0  . Lactobacillus-Inulin (CULTURELLE DIGESTIVE DAILY PO) Take 1 capsule by mouth daily.    . lamoTRIgine (LAMICTAL) 200 MG tablet Take 400 mg by mouth daily.    . latanoprost (XALATAN) 0.005 % ophthalmic solution Place 1 drop into both eyes at bedtime as needed.     . letrozole (FEMARA) 2.5 MG tablet TAKE 1 TABLET(2.5 MG) BY MOUTH DAILY 30 tablet 12  . linaclotide (LINZESS) 290 MCG CAPS capsule Take 1 capsule (290 mcg total) by mouth daily before breakfast. 30 capsule 11  . lithium carbonate (ESKALITH) 450 MG CR tablet Take 900 mg by mouth at bedtime.    . Melatonin 10 MG CAPS Take 1 capsule by mouth at bedtime.    . mesalamine (LIALDA) 1.2 g EC tablet Take 2 tablets (2.4 g total) by mouth daily. 60 tablet 11  . Multiple Vitamin (MULTIVITAMIN) tablet Take 1 tablet by mouth daily.      . nitroGLYCERIN (NITROSTAT) 0.4 MG SL  tablet Place 1 tablet (0.4 mg total) under the tongue every 5 (five) minutes as needed for chest pain. 25 tablet 3  . Omega-3 Krill Oil 500 MG CAPS Take 500 mg by mouth.    . ondansetron (ZOFRAN-ODT) 4 MG disintegrating tablet Take 1 tablet (4 mg total) by mouth every 6 (six) hours as needed for nausea or vomiting. 90 tablet 1  . OZEMPIC, 1 MG/DOSE, 2 MG/1.5ML SOPN INJECT 1 MG UNDER THE SKIN ONCE A WEEK 6 mL 1  . pantoprazole (PROTONIX) 40 MG tablet TAKE 1 TABLET(40 MG) BY MOUTH TWICE DAILY 60 tablet 11  . Probiotic Product (PROBIOTIC DAILY PO) Take by mouth.    . QUEtiapine (SEROQUEL XR) 400 MG 24 hr tablet Take 800 mg by mouth at bedtime.    . Simethicone (GAS-X PO) Take 1 tablet by mouth 4 (four) times daily.    . SYNTHROID 200 MCG tablet Take 1 tablet by mouth x 6 days and 2 tablets by mouth on day 7 (Patient taking differently: Take 1 tablet by mouth x 6 days and 1.5 tablets by mouth on day 7) 34 tablet 2  . triamcinolone (KENALOG) 0.1 % paste Use as directed 1 application in the mouth or throat 2 (two) times daily. To   affected areas 5 g 1  . valACYclovir (VALTREX) 1000 MG tablet TAKE 1 TABLET(1000 MG) BY MOUTH DAILY 30 tablet 5  . vitamin C (ASCORBIC ACID) 500 MG tablet Take 250 mg by mouth daily. Takes 2 a day    . VYVANSE 60 MG capsule Take 60 mg by mouth every morning.   0  . zaleplon (SONATA) 10 MG capsule Take 20 mg by mouth at bedtime as needed for sleep.     No current facility-administered medications for this visit.    PHYSICAL EXAMINATION: ECOG PERFORMANCE STATUS: 0 - Asymptomatic  BP (!) 157/72 (BP Location: Right Arm, Patient Position: Sitting, Cuff Size: Normal)   Pulse (!) 116   Temp (!) 96.9 F (36.1 C) (Tympanic)   Wt 202 lb (91.6 kg)   LMP 08/23/2006 (Approximate) Comment: tubal ligation  BMI 32.12 kg/m   Filed Weights   02/25/19 1400  Weight: 202 lb (91.6 kg)    Physical Exam  Constitutional: She is oriented to person, place, and time and well-developed,  well-nourished, and in no distress.  Accompanied by her husband.  HENT:  Head: Normocephalic and atraumatic.  Mouth/Throat: Oropharynx is clear and moist. No oropharyngeal exudate.  Eyes: Pupils are equal, round, and reactive to light.  Cardiovascular: Normal rate and regular rhythm.  Pulmonary/Chest: Effort normal and breath sounds normal. No respiratory distress. She has no wheezes.  Abdominal: Soft. Bowel sounds are normal. She exhibits no distension and no mass. There is no abdominal tenderness. There is no rebound and no guarding.  Musculoskeletal:        General: No tenderness or edema. Normal range of motion.     Cervical back: Normal range of motion and neck supple.  Neurological: She is alert and oriented to person, place, and time.  Skin: Skin is warm.  Bilateral mastectomy noted.  No lumps or bumps.  Psychiatric: Affect normal.       LABORATORY DATA:  I have reviewed the data as listed    Component Value Date/Time   NA 139 02/26/2019 1337   K 3.6 02/26/2019 1337   CL 105 02/26/2019 1337   CO2 23 02/26/2019 1337   GLUCOSE 174 (H) 02/26/2019 1337   BUN 10 02/26/2019 1337   CREATININE 0.77 02/26/2019 1337   CALCIUM 9.7 02/26/2019 1337   PROT 7.1 02/26/2019 1337   ALBUMIN 4.5 02/26/2019 1337   AST 46 (H) 02/26/2019 1337   ALT 43 02/26/2019 1337   ALKPHOS 80 02/26/2019 1337   BILITOT 0.8 02/26/2019 1337   GFRNONAA >60 02/26/2019 1337   GFRAA >60 02/26/2019 1337    No results found for: SPEP, UPEP  Lab Results  Component Value Date   WBC 8.5 02/26/2019   NEUTROABS 5.5 02/26/2019   HGB 15.1 (H) 02/26/2019   HCT 48.1 (H) 02/26/2019   MCV 93.2 02/26/2019   PLT 318 02/26/2019      Chemistry      Component Value Date/Time   NA 139 02/26/2019 1337   K 3.6 02/26/2019 1337   CL 105 02/26/2019 1337   CO2 23 02/26/2019 1337   BUN 10 02/26/2019 1337   CREATININE 0.77 02/26/2019 1337      Component Value Date/Time   CALCIUM 9.7 02/26/2019 1337   ALKPHOS 80  02/26/2019 1337   AST 46 (H) 02/26/2019 1337   ALT 43 02/26/2019 1337   BILITOT 0.8 02/26/2019 1337       RADIOGRAPHIC STUDIES: I have personally reviewed the radiological images as listed and  agreed with the findings in the report. No results found.   ASSESSMENT & PLAN:  Carcinoma of overlapping sites of left breast in female, estrogen receptor positive ([place]) # LEFT Breast Stage I s/p mastec [prophy right mastec] on Low risk mammoprint- on Letrozle. NED [until summer 2021].  Clinically stable.  Tumor marker is normal.  # Ulcerative colitis-colo-may 2020; stable.  # Arthritis- sec to AI.  Stable  # BMD- Sep 2018- osteopenia. Con ca+vit D.  Stable  # mild elevated LFts- ? Lipitor/faty liver- exercise stable  # DISPOSITION: # follow up in 6 monthsMD/labs-cbc/cmpDr.B     Orders Placed This Encounter  Procedures  . CBC with Differential    Standing Status:   Future    Standing Expiration Date:   02/26/2020  . Comprehensive metabolic panel    Standing Status:   Future    Standing Expiration Date:   02/26/2020   All questions were answered. The patient knows to call the clinic with any problems, questions or concerns.      Cammie Sickle, MD 03/01/2019 8:13 AM

## 2019-02-26 NOTE — Assessment & Plan Note (Addendum)
#   LEFT Breast Stage I s/p mastec [prophy right mastec] on Low risk mammoprint- on Letrozle. NED [until summer 2021].  Clinically stable.  Tumor marker is normal.  # Ulcerative colitis-colo-may 2020; stable.  # Arthritis- sec to AI.  Stable  # BMD- Sep 2018- osteopenia. Con ca+vit D.  Stable  # mild elevated LFts- ? Lipitor/faty liver- exercise stable  # DISPOSITION: # follow up in 6 monthsMD/labs-cbc/cmpDr.B

## 2019-03-02 ENCOUNTER — Other Ambulatory Visit: Payer: Self-pay

## 2019-03-02 ENCOUNTER — Ambulatory Visit (INDEPENDENT_AMBULATORY_CARE_PROVIDER_SITE_OTHER): Payer: BC Managed Care – PPO

## 2019-03-02 DIAGNOSIS — G3281 Cerebellar ataxia in diseases classified elsewhere: Secondary | ICD-10-CM | POA: Diagnosis not present

## 2019-03-04 ENCOUNTER — Telehealth: Payer: Self-pay | Admitting: Neurology

## 2019-03-04 NOTE — Telephone Encounter (Signed)
I called the patient.  MRI of the brain was normal.  No evidence of a stroke event, we will follow the patient's memory issues over time.  Patient continues to have some word finding problems.    MRI brain 03/04/19:  IMPRESSION:   Unremarkable MRI brain (without). No acute findings.

## 2019-03-08 ENCOUNTER — Other Ambulatory Visit: Payer: Self-pay | Admitting: *Deleted

## 2019-03-08 MED ORDER — VALACYCLOVIR HCL 1 G PO TABS: ORAL_TABLET | ORAL | 5 refills | Status: DC

## 2019-03-08 NOTE — Telephone Encounter (Signed)
Fax refill request, last filled on 08/19/18 #30 tabs with 5 refills, last OV was on 12/16/18 for fall/dizziness, please advise   [facility]

## 2019-03-18 ENCOUNTER — Ambulatory Visit (INDEPENDENT_AMBULATORY_CARE_PROVIDER_SITE_OTHER): Payer: BC Managed Care – PPO | Admitting: Psychology

## 2019-03-18 DIAGNOSIS — F3131 Bipolar disorder, current episode depressed, mild: Secondary | ICD-10-CM | POA: Diagnosis not present

## 2019-03-19 ENCOUNTER — Other Ambulatory Visit: Payer: Self-pay | Admitting: Endocrinology

## 2019-03-22 ENCOUNTER — Other Ambulatory Visit: Payer: Self-pay

## 2019-03-24 ENCOUNTER — Encounter: Payer: Self-pay | Admitting: Family Medicine

## 2019-03-24 ENCOUNTER — Other Ambulatory Visit: Payer: Self-pay

## 2019-03-24 MED ORDER — OZEMPIC (1 MG/DOSE) 2 MG/1.5ML ~~LOC~~ SOPN: 1.0000 mg | PEN_INJECTOR | SUBCUTANEOUS | 2 refills | Status: DC

## 2019-03-24 MED ORDER — GABAPENTIN 300 MG PO CAPS: ORAL_CAPSULE | ORAL | 0 refills | Status: DC

## 2019-03-24 NOTE — Telephone Encounter (Signed)
See my chart message. I don't see any recent or future f/u or CPE and gabapentin last filled on 11/05/18 #90 caps with 3 refills so pt taking TID she would be due for a refill

## 2019-03-24 NOTE — Telephone Encounter (Signed)
I send a month supply  Please schedule PE  Thanks

## 2019-03-26 DIAGNOSIS — C50112 Malignant neoplasm of central portion of left female breast: Secondary | ICD-10-CM | POA: Diagnosis not present

## 2019-03-26 DIAGNOSIS — C50111 Malignant neoplasm of central portion of right female breast: Secondary | ICD-10-CM | POA: Diagnosis not present

## 2019-03-26 DIAGNOSIS — Z9013 Acquired absence of bilateral breasts and nipples: Secondary | ICD-10-CM | POA: Diagnosis not present

## 2019-04-01 ENCOUNTER — Other Ambulatory Visit: Payer: Self-pay | Admitting: *Deleted

## 2019-04-01 MED ORDER — LETROZOLE 2.5 MG PO TABS: ORAL_TABLET | ORAL | 4 refills | Status: DC

## 2019-04-07 ENCOUNTER — Telehealth: Payer: Self-pay | Admitting: Family Medicine

## 2019-04-07 DIAGNOSIS — Z0000 Encounter for general adult medical examination without abnormal findings: Secondary | ICD-10-CM

## 2019-04-07 DIAGNOSIS — E1169 Type 2 diabetes mellitus with other specified complication: Secondary | ICD-10-CM

## 2019-04-07 DIAGNOSIS — E559 Vitamin D deficiency, unspecified: Secondary | ICD-10-CM

## 2019-04-07 DIAGNOSIS — I10 Essential (primary) hypertension: Secondary | ICD-10-CM

## 2019-04-07 DIAGNOSIS — E785 Hyperlipidemia, unspecified: Secondary | ICD-10-CM

## 2019-04-07 NOTE — Telephone Encounter (Signed)
-----   Message from [name] sent at 04/06/2019  4:30 PM EDT ----- Regarding: Lab orders for Tuesday, 4.6.21 Patient is scheduled for CPX labs, please order future labs, Thanks , Karna Christmas

## 2019-04-14 ENCOUNTER — Encounter: Payer: Self-pay | Admitting: Internal Medicine

## 2019-04-14 ENCOUNTER — Encounter: Payer: Self-pay | Admitting: Family Medicine

## 2019-04-14 DIAGNOSIS — M542 Cervicalgia: Secondary | ICD-10-CM | POA: Diagnosis not present

## 2019-04-14 DIAGNOSIS — M545 Low back pain: Secondary | ICD-10-CM | POA: Diagnosis not present

## 2019-04-16 DIAGNOSIS — F3174 Bipolar disorder, in full remission, most recent episode manic: Secondary | ICD-10-CM | POA: Diagnosis not present

## 2019-04-16 DIAGNOSIS — F3176 Bipolar disorder, in full remission, most recent episode depressed: Secondary | ICD-10-CM | POA: Diagnosis not present

## 2019-04-16 DIAGNOSIS — F900 Attention-deficit hyperactivity disorder, predominantly inattentive type: Secondary | ICD-10-CM | POA: Diagnosis not present

## 2019-04-18 ENCOUNTER — Encounter: Payer: Self-pay | Admitting: Family Medicine

## 2019-04-20 ENCOUNTER — Other Ambulatory Visit: Payer: BC Managed Care – PPO

## 2019-04-21 ENCOUNTER — Other Ambulatory Visit (INDEPENDENT_AMBULATORY_CARE_PROVIDER_SITE_OTHER): Payer: BC Managed Care – PPO

## 2019-04-21 ENCOUNTER — Other Ambulatory Visit: Payer: Self-pay

## 2019-04-21 DIAGNOSIS — E785 Hyperlipidemia, unspecified: Secondary | ICD-10-CM | POA: Diagnosis not present

## 2019-04-21 DIAGNOSIS — E559 Vitamin D deficiency, unspecified: Secondary | ICD-10-CM | POA: Diagnosis not present

## 2019-04-21 DIAGNOSIS — E1169 Type 2 diabetes mellitus with other specified complication: Secondary | ICD-10-CM

## 2019-04-21 DIAGNOSIS — I10 Essential (primary) hypertension: Secondary | ICD-10-CM | POA: Diagnosis not present

## 2019-04-21 LAB — CBC WITH DIFFERENTIAL/PLATELET
Basophils Absolute: 0 10*3/uL (ref 0.0–0.1)
Basophils Relative: 0.7 % (ref 0.0–3.0)
Eosinophils Absolute: 0.3 10*3/uL (ref 0.0–0.7)
Eosinophils Relative: 4.6 % (ref 0.0–5.0)
HCT: 47.5 % — ABNORMAL HIGH (ref 36.0–46.0)
Hemoglobin: 15.8 g/dL — ABNORMAL HIGH (ref 12.0–15.0)
Lymphocytes Relative: 26.5 % (ref 12.0–46.0)
Lymphs Abs: 1.9 10*3/uL (ref 0.7–4.0)
MCHC: 33.2 g/dL (ref 30.0–36.0)
MCV: 93.3 fl (ref 78.0–100.0)
Monocytes Absolute: 0.6 10*3/uL (ref 0.1–1.0)
Monocytes Relative: 8.3 % (ref 3.0–12.0)
Neutro Abs: 4.4 10*3/uL (ref 1.4–7.7)
Neutrophils Relative %: 59.9 % (ref 43.0–77.0)
Platelets: 329 10*3/uL (ref 150.0–400.0)
RBC: 5.09 Mil/uL (ref 3.87–5.11)
RDW: 15.4 % (ref 11.5–15.5)
WBC: 7.3 10*3/uL (ref 4.0–10.5)

## 2019-04-21 LAB — LIPID PANEL
Cholesterol: 228 mg/dL — ABNORMAL HIGH (ref 0–200)
HDL: 46.9 mg/dL (ref >39.00)
NonHDL: 180.72
Total CHOL/HDL Ratio: 5
Triglycerides: 281 mg/dL — ABNORMAL HIGH (ref 0.0–149.0)
VLDL: 56.2 mg/dL — ABNORMAL HIGH (ref 0.0–40.0)

## 2019-04-21 LAB — COMPREHENSIVE METABOLIC PANEL
ALT: 42 U/L — ABNORMAL HIGH (ref 0–35)
AST: 34 U/L (ref 0–37)
Albumin: 4.6 g/dL (ref 3.5–5.2)
Alkaline Phosphatase: 94 U/L (ref 39–117)
BUN: 10 mg/dL (ref 6–23)
CO2: 30 mEq/L (ref 19–32)
Calcium: 9.8 mg/dL (ref 8.4–10.5)
Chloride: 104 mEq/L (ref 96–112)
Creatinine, Ser: 0.9 mg/dL (ref 0.40–1.20)
GFR: 63.71 mL/min (ref >60.00)
Glucose, Bld: 138 mg/dL — ABNORMAL HIGH (ref 70–99)
Potassium: 4.2 mEq/L (ref 3.5–5.1)
Sodium: 141 mEq/L (ref 135–145)
Total Bilirubin: 0.8 mg/dL (ref 0.2–1.2)
Total Protein: 6.7 g/dL (ref 6.0–8.3)

## 2019-04-21 LAB — TSH: TSH: 3.53 u[IU]/mL (ref 0.35–4.50)

## 2019-04-21 LAB — LDL CHOLESTEROL, DIRECT: Direct LDL: 138 mg/dL

## 2019-04-21 LAB — VITAMIN D 25 HYDROXY (VIT D DEFICIENCY, FRACTURES): VITD: 39.94 ng/mL (ref 30.00–100.00)

## 2019-04-23 ENCOUNTER — Encounter: Payer: Self-pay | Admitting: Family Medicine

## 2019-04-23 ENCOUNTER — Ambulatory Visit (INDEPENDENT_AMBULATORY_CARE_PROVIDER_SITE_OTHER): Payer: BC Managed Care – PPO | Admitting: Family Medicine

## 2019-04-23 ENCOUNTER — Other Ambulatory Visit: Payer: Self-pay

## 2019-04-23 VITALS — BP 136/84 | HR 98 | Temp 96.9°F | Ht 65.0 in | Wt 204.6 lb

## 2019-04-23 DIAGNOSIS — E1159 Type 2 diabetes mellitus with other circulatory complications: Secondary | ICD-10-CM

## 2019-04-23 DIAGNOSIS — M85859 Other specified disorders of bone density and structure, unspecified thigh: Secondary | ICD-10-CM

## 2019-04-23 DIAGNOSIS — I471 Supraventricular tachycardia: Secondary | ICD-10-CM

## 2019-04-23 DIAGNOSIS — E785 Hyperlipidemia, unspecified: Secondary | ICD-10-CM

## 2019-04-23 DIAGNOSIS — K51919 Ulcerative colitis, unspecified with unspecified complications: Secondary | ICD-10-CM

## 2019-04-23 DIAGNOSIS — F319 Bipolar disorder, unspecified: Secondary | ICD-10-CM

## 2019-04-23 DIAGNOSIS — Z0000 Encounter for general adult medical examination without abnormal findings: Secondary | ICD-10-CM | POA: Diagnosis not present

## 2019-04-23 DIAGNOSIS — E669 Obesity, unspecified: Secondary | ICD-10-CM | POA: Insufficient documentation

## 2019-04-23 DIAGNOSIS — I25118 Atherosclerotic heart disease of native coronary artery with other forms of angina pectoris: Secondary | ICD-10-CM

## 2019-04-23 DIAGNOSIS — Z853 Personal history of malignant neoplasm of breast: Secondary | ICD-10-CM

## 2019-04-23 DIAGNOSIS — I10 Essential (primary) hypertension: Secondary | ICD-10-CM

## 2019-04-23 DIAGNOSIS — E1169 Type 2 diabetes mellitus with other specified complication: Secondary | ICD-10-CM

## 2019-04-23 DIAGNOSIS — F3130 Bipolar disorder, current episode depressed, mild or moderate severity, unspecified: Secondary | ICD-10-CM

## 2019-04-23 DIAGNOSIS — E559 Vitamin D deficiency, unspecified: Secondary | ICD-10-CM

## 2019-04-23 DIAGNOSIS — E890 Postprocedural hypothyroidism: Secondary | ICD-10-CM

## 2019-04-23 DIAGNOSIS — Z794 Long term (current) use of insulin: Secondary | ICD-10-CM

## 2019-04-23 DIAGNOSIS — E1165 Type 2 diabetes mellitus with hyperglycemia: Secondary | ICD-10-CM

## 2019-04-23 NOTE — Patient Instructions (Addendum)
Go ahead and get your covid vaccines   If you are interested in the new shingles vaccine (Shingrix) - call your local pharmacy to check on coverage and availability  If affordable, get on a wait list at your pharmacy to get the vaccine.  Get back to exercise  Eat a healthy diet  Try to get most of your carbohydrates from produce (with the exception of white potatoes)  Eat less bread/pasta/rice/snack foods/cereals/sweets and other items from the middle of the grocery store (processed carbs)

## 2019-04-23 NOTE — Progress Notes (Signed)
Subjective:    Patient ID: Lindsay Fry, female    DOB: 10/13/1960, 59 y.o.   MRN: [id number]  This visit occurred during the SARS-CoV-2 public health emergency.  Safety protocols were in place, including screening questions prior to the visit, additional usage of staff PPE, and extensive cleaning of exam room while observing appropriate contact time as indicated for disinfecting solutions.    HPI  Here for health maintenance exam and to review chronic medical problems    Wt Readings from Last 3 Encounters:  04/23/19 204 lb 9 oz (92.8 kg)  02/25/19 202 lb (91.6 kg)  02/05/19 204 lb 3.2 oz (92.6 kg)   34.04 kg/m  Exercise -she walks the driveway (long)  Usually exercises [facility] the aquatic center -now it is opening back up    Had a bout of nausea on Tuesday  Took zofran and it helped  Better now  Getting appetite back  Drinking lot of fluids   covid status - has not been vaccinated yet (plans to schedule)  Husband had his vaccines  Zoster status has not had vaccine- considering  Flu shot - did not get one but forgot  Tdap 11/12 pna vaccine 10/18   Eye exam - was about 6 months ago (diabetic)   Pap 4/17-neg with neg HPV screen  Had gyn exam in February (does not think she had a pap)  More urinary frequency   Mammogram - none due to mastectomy  No re occurrences   Colonoscopy 4/20  Next is planned in another year (2 y recall)    bp is stable today  No cp or palpitations or headaches or edema  No side effects to medicines  BP Readings from Last 3 Encounters:  04/23/19 136/84  02/25/19 (!) 157/72  02/05/19 124/70      Lab Results  Component Value Date   CREATININE 0.90 04/21/2019   BUN 10 04/21/2019   NA 141 04/21/2019   K 4.2 04/21/2019   CL 104 04/21/2019   CO2 30 04/21/2019   Lab Results  Component Value Date   ALT 42 (H) 04/21/2019   AST 34 04/21/2019   ALKPHOS 94 04/21/2019   BILITOT 0.8 04/21/2019    H/o UC May need colectomy in the future    Lab Results  Component Value Date   WBC 7.3 04/21/2019   HGB 15.8 (H) 04/21/2019   HCT 47.5 (H) 04/21/2019   MCV 93.3 04/21/2019   PLT 329.0 04/21/2019    DM2 Sees Dr Dwyane Dee  Lab Results  Component Value Date   HGBA1C 6.2 (A) 02/05/2019    Lab Results  Component Value Date   MICROALBUR 2.2 (H) 03/31/2018   MICROALBUR 3.6 (H) 02/06/2017    Hyperlipidemia in setting of CAD Lab Results  Component Value Date   CHOL 228 (H) 04/21/2019   CHOL 151 02/05/2019   CHOL 151 09/28/2018   Lab Results  Component Value Date   HDL 46.90 04/21/2019   HDL 47.30 02/05/2019   HDL 45.40 09/28/2018   Lab Results  Component Value Date   LDLCALC 74 02/05/2019   LDLCALC 79 09/28/2018   LDLCALC 98 03/31/2018   Lab Results  Component Value Date   TRIG 281.0 (H) 04/21/2019   TRIG 150.0 (H) 02/05/2019   TRIG 135.0 09/28/2018   Lab Results  Component Value Date   CHOLHDL 5 04/21/2019   CHOLHDL 3 02/05/2019   CHOLHDL 3 09/28/2018   Lab Results  Component Value Date  LDLDIRECT 138.0 04/21/2019   LDLDIRECT 67.0 11/10/2015   LDLDIRECT 94.0 11/28/2014  stable on current medications   Hypothyroidism Sees Dr Dwyane Dee Lab Results  Component Value Date   TSH 3.53 04/21/2019    Osteopenia of femoral neck  dexa 9/18-wants to wait on it [place] -fell (at home coming back to bathroom)  Fractures -none recent  Vit D level is 39.9   Bipolar disorder - has little mood changes up and down but for the most part doing well  Dr Lajoyce Corners watches her renal fxn  occ generic sonata for sleep (usually 1 occ 2)   Patient Active Problem List   Diagnosis Date Noted  . Obesity (BMI 30-39.9) 04/23/2019  . Dizziness 12/16/2018  . Poor balance 12/16/2018  . Fatigue 12/16/2018  . Migraine 11/12/2018  . Exposure to COVID-19 virus 06/19/2018  . Thoracic back pain 02/19/2018  . Uncontrolled type 2 diabetes mellitus with hyperglycemia, with long-term current use of insulin ([place]) 02/19/2018  . Pleural  thickening 11/05/2017  . Abrasion of right knee 11/05/2017  . Fall 11/05/2017  . Mouth ulcers 11/05/2017  . Cervical radiculopathy due to degenerative joint disease of spine 10/28/2017  . 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 Supraventricular tachycardia ([place]) 04/22/2017  . Osteopenia 09/25/2016  . Estrogen deficiency 05/30/2016  . Need for hepatitis C screening test 03/19/2016  . Screening for HIV (human immunodeficiency virus) 03/19/2016  . Carcinoma of overlapping sites of left breast in female, estrogen receptor positive ([place]) 08/21/2015  . Chronic constipation 01/18/2015  . NSTEMI (non-ST elevated myocardial infarction) ([place]) 09/06/2014  . CAD (coronary artery disease) 09/06/2014  . DM (diabetes mellitus) ([place]) 09/05/2014  . 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 History of breast cancer 06/02/2014  . Sinus tachycardia 04/27/2014  . ADD (attention deficit disorder) 11/19/2012  . Urinary frequency 08/05/2012  . Low back pain 08/05/2012  . Hypothyroid 05/18/2012  . Chronic sinusitis 01/24/2012  . Vertigo, benign positional 01/24/2012  . Other screening mammogram 12/05/2010  . Routine general medical examination at a health care facility 10/14/2010  . FATIGUE 08/09/2009  . POSTMENOPAUSAL STATUS 06/22/2008  . Vitamin D deficiency 03/18/2008  . BENIGN NEOPLASM [place] SITE DIGESTIVE SYSTEM 01/09/2007  . HIATAL HERNIA 01/09/2007  . COLONIC POLYPS, ADENOMATOUS, HX OF 01/09/2007  . Hyperlipidemia associated with type 2 diabetes mellitus ([place]) 01/07/2007  . Bipolar I disorder, most recent episode depressed ([place]) 01/07/2007  . Essential hypertension 01/07/2007  . ALLERGIC RHINITIS 01/07/2007  . ASTHMA 01/07/2007  . GERD 01/07/2007  . Ulcerative colitis ([place]) 01/07/2007  . ACNE ROSACEA 01/07/2007  . MIGRAINES, HX OF 01/07/2007  . Fibromyalgia 10/30/2006  . INSOMNIA 10/30/2006   Past Medical History:  Diagnosis Date  . ADD (attention deficit disorder) 11/19/2012  .  Allergic rhinitis, cause unspecified   . Anxiety state, unspecified   . Arthritis   . Asymptomatic postmenopausal status (age-related) (natural)   . Atypical hyperplasia of left breast 2000  . Benign neoplasm of other and unspecified site of the digestive system   . Bipolar disorder ([place])   . Breast cancer ([place])    Left Breast 55m invasive CA left uiq, dcis left uoq and a lot ADH, ALH in both breasts.  .Marland KitchenCAD (coronary artery disease)    a. 08/2014 NSTEMI/Cath: LM nl, LAD 50p, D1/D2 min irregs, LCX nl, OM1 40, LPDA nl, RI 99ost (2.067mvessel->med Rx), RCA nl, AM 60, nl EF; b. 11/2016 MV: EF 74%, no ischemia (performed 2/2 dyspnea & new inf  TWI).  . Carcinoma of left breast ([place]) 06/02/2014  . Cataract   . Chronic diastolic CHF (congestive heart failure) ([place])    a. 08/2014 Echo: EF 60-65%, Gr 1 DD, nl LA.  Marland Kitchen Clotting disorder ([place])    on blood thiner, Plavix  . Cyst    on Achilles tendon  . Depression   . Diabetes mellitus ([place])    frank  . Dysuria   . Edema   . Family history of malignant neoplasm of gastrointestinal tract   . Fibromyalgia   . GERD (gastroesophageal reflux disease)   . Glaucoma    pre glaucoma, 05/14/2018 pt. denies glaucoma  . Headache    migraines  . Hiatal hernia   . History of alcoholism ([place])   . History of migraines   . History of ovarian cyst   . Hx of adenomatous colonic polyps   . Insomnia, unspecified   . Myocardial infarction ([place]) 09-04-14  . OCD (obsessive compulsive disorder)   . Osteopenia 09/25/2016   Femoral neck T -1.1  9/18  . Other screening mammogram   . Pure hypercholesterolemia   . Rosacea   . Subacute confusional state 11/19/2012  . Tubular adenoma of colon 01/10/11  . Ulcerative colitis, unspecified   . Unspecified asthma(493.90)   . Unspecified essential hypertension   . Unspecified hypothyroidism   . Unspecified vitamin D deficiency   . Vertigo    Past Surgical History:  Procedure Laterality Date  . APPENDECTOMY    .  AXILLARY SENTINEL NODE BIOPSY Left 05/23/2014   Procedure: AXILLARY SENTINEL NODE BIOPSY;  Surgeon: Christene Lye, MD;  Location: [facility];  Service: General;  Laterality: Left;  . BREAST BIOPSY  Aug 2000   on tamoxifen, atypical hyperplasia  . BREAST BIOPSY Left 04/2014  . BREAST SURGERY Left Aug 2000   lumpectomy/ Dr Sharlet Salina  . BREAST SURGERY Bilateral 05/23/14   Mastectomy  . BUNIONECTOMY Right   . CARDIAC CATHETERIZATION N/A 09/05/2014   Procedure: Left Heart Cath and Coronary Angiography;  Surgeon: Wellington Hampshire, MD;  Location: [facility];  Service: Cardiovascular;  Laterality: N/A;  . CARDIAC CATHETERIZATION  09-05-14  . CATARACT EXTRACTION W/ INTRAOCULAR LENS IMPLANT Bilateral   . CESAREAN SECTION  1996/1997   placenta previa, gest DM, pre-eclampsia  . CHOLECYSTECTOMY  1997   adhesions also  . COLONOSCOPY  01/2001   Ulcerative colitis  . COLONOSCOPY  04/2004   UC, polyp  . COLONOSCOPY  12/08   UC, no polyps  . DEXA  04/1999 and 2010   normal  . ESOPHAGOGASTRODUODENOSCOPY  09/2001   polyp  . exercise stress test  11/2003   negative  . FEMUR FRACTURE SURGERY Left   . MASTECTOMY Bilateral   . MECKEL DIVERTICULUM EXCISION  1999  . NASAL SINUS SURGERY  05/1997  . nuclear stress test  06/2007   negative  . precancerous mole removed    . SIMPLE MASTECTOMY WITH AXILLARY SENTINEL NODE BIOPSY Bilateral 05/23/2014   Procedure: Bilateral simple mastectomy, left sentinel node biopsy ;  Surgeon: Christene Lye, MD;  Location: [facility];  Service: General;  Laterality: Bilateral;  . Sleep study  11/09   no apnea, but did snore (done by [facility])  . TONSILLECTOMY  1984  . TUBAL LIGATION    . WISDOM TOOTH EXTRACTION  1980's   Social History   Tobacco Use  . Smoking status: Former Smoker    Packs/day: 0.25    Years: 5.00  Pack years: 1.25    Types: Cigarettes    Quit date: 01/14/1993    Years since quitting: 26.2  . Smokeless tobacco: Never Used  Substance  Use Topics  . Alcohol use: No    Alcohol/week: 0.0 standard drinks    Comment: Recovered ETOH  . Drug use: No   Family History  Problem Relation Age of Onset  . Coronary artery disease Father   . Colon cancer Father   . Alzheimer's disease Father   . Dementia Father   . Colon polyps Father   . Coronary artery disease Mother   . Hypertension Mother   . Osteoporosis Mother   . Crohn's disease Mother        crohns colitis  . Breast cancer Other        great aunts  . Coronary artery disease Other        Uncle (also AAA)  . Diabetes Other        remote family history  . Stroke Cousin   . Esophageal cancer Neg Hx   . Rectal cancer Neg Hx   . Stomach cancer Neg Hx    Allergies  Allergen Reactions  . Ephedrine Other (See Comments)    Pt becomes hyper  . Aspartame And Phenylalanine Nausea Only  . Aspirin     REACTION: aggrivates colitis  . Crestor [Rosuvastatin]     REACTION: increased lfts  . Erythromycin     REACTION: GI upset  . Nsaids     REACTION: aggrivate colitis  . Oxycodone Other (See Comments)    Panic Attack   Current Outpatient Medications on File Prior to Visit  Medication Sig Dispense Refill  . acetaminophen (TYLENOL) 650 MG CR tablet Take 1,300 mg by mouth 2 (two) times daily.    Marland Kitchen amantadine (SYMMETREL) 100 MG capsule Take 100 mg by mouth 2 (two) times daily.    Marland Kitchen atorvastatin (LIPITOR) 80 MG tablet TAKE 1 TABLET(80 MG) BY MOUTH DAILY 90 tablet 3  . B-D UF III MINI PEN NEEDLES 31G X 5 MM MISC USE FOR INJECTIONS TWICE DAILY 100 each 0  . BAYER MICROLET LANCETS lancets USE AS DIRECTED TO CHECK BLOOD SUGAR TWICE DAILY 200 each 2  . Bioflavonoid Products (VITAMIN C) CHEW Chew 1 tablet by mouth daily.    . Blood Glucose Monitoring Suppl (CONTOUR NEXT EZ MONITOR) w/Device KIT USE AS DIRECTED TO CHECK BLOOD SUGAR TWICE DAILY 1 kit 2  . Calcium Carb-Cholecalciferol (CALCIUM PLUS VITAMIN D3) 600-800 MG-UNIT TABS Take 1 tablet by mouth daily.    . canagliflozin  (INVOKANA) 300 MG TABS tablet TAKE 1 TABLET(300 MG) BY MOUTH DAILY BEFORE BREAKFAST 30 tablet 3  . Cholecalciferol (VITAMIN D3) 50 MCG (2000 UT) capsule Take 2,000 Units by mouth 2 (two) times daily.    . clopidogrel (PLAVIX) 75 MG tablet TAKE 1 TABLET(75 MG) BY MOUTH DAILY 90 tablet 2  . co-enzyme Q-10 50 MG capsule Take 50 mg by mouth daily.    . Cyanocobalamin (VITAMIN B-12 PO) 3000 mcg-- 2 gummies daily    . diazepam (VALIUM) 10 MG tablet Take 2.5-10 mg by mouth as needed for anxiety.    Marland Kitchen diltiazem (CARDIZEM CD) 240 MG 24 hr capsule TAKE 1 CAPSULE(240 MG) BY MOUTH DAILY 90 capsule 3  . diltiazem (CARDIZEM) 30 MG tablet Take 1 tablet (30 mg) by mouth up to three times a day as needed for a blood pressure > 150 (top number) 90 tablet 1  . diphenoxylate-atropine (  LOMOTIL) 2.5-0.025 MG tablet Take 1-2 tabs daily as needed for diarrhea 50 tablet 0  . docusate sodium (COLACE) 100 MG capsule Take 100 mg by mouth daily.    Marland Kitchen ezetimibe (ZETIA) 10 MG tablet Take 1 tablet (10 mg total) by mouth daily. 90 tablet 3  . famotidine (PEPCID) 40 MG tablet Take 40 mg by mouth 2 (two) times daily.    Marland Kitchen FIBER SELECT GUMMIES PO Take 3 capsules by mouth daily.    Marland Kitchen gabapentin (NEURONTIN) 300 MG capsule TAKE 1 CAPSULE(300 MG) BY MOUTH THREE TIMES DAILY 90 capsule 0  . glucose blood (BAYER CONTOUR NEXT TEST) test strip USE AS DIRECTED TO CHECK BLOOD SUGAR TWICE DAILY 200 each 2  . insulin detemir (LEVEMIR FLEXTOUCH) 100 UNIT/ML FlexPen Inject 51 units under the skin once daily in the evening. 30 mL 2  . Insulin Pen Needle (B-D UF III MINI PEN NEEDLES) 31G X 5 MM MISC USE FOR INJECTIONS TWICE DAILY 100 each 0  . Lactobacillus-Inulin (CULTURELLE DIGESTIVE DAILY PO) Take 1 capsule by mouth daily.    Marland Kitchen lamoTRIgine (LAMICTAL) 200 MG tablet Take 400 mg by mouth daily.    Marland Kitchen latanoprost (XALATAN) 0.005 % ophthalmic solution Place 1 drop into both eyes 2 (two) times a week.     . letrozole (FEMARA) 2.5 MG tablet TAKE 1  TABLET(2.5 MG) BY MOUTH DAILY 30 tablet 4  . linaclotide (LINZESS) 290 MCG CAPS capsule Take 1 capsule (290 mcg total) by mouth daily before breakfast. 30 capsule 11  . lithium carbonate (ESKALITH) 450 MG CR tablet Take 900 mg by mouth at bedtime.    . Melatonin 10 MG CAPS Take 1 capsule by mouth at bedtime.    . mesalamine (LIALDA) 1.2 g EC tablet Take 2 tablets (2.4 g total) by mouth daily. 60 tablet 11  . Multiple Vitamin (MULTIVITAMIN) tablet Take 1 tablet by mouth daily.      . Omega-3 Krill Oil 500 MG CAPS Take 500 mg by mouth.    . ondansetron (ZOFRAN-ODT) 4 MG disintegrating tablet Take 1 tablet (4 mg total) by mouth every 6 (six) hours as needed for nausea or vomiting. 90 tablet 1  . pantoprazole (PROTONIX) 40 MG tablet TAKE 1 TABLET(40 MG) BY MOUTH TWICE DAILY 60 tablet 11  . Polyethylene Glycol 3350 (MIRALAX PO) Take 1 Scoop by mouth daily as needed.    . Probiotic Product (PROBIOTIC DAILY PO) Take by mouth.    . QUEtiapine (SEROQUEL XR) 400 MG 24 hr tablet Take 800 mg by mouth at bedtime.    . Semaglutide, 1 MG/DOSE, (OZEMPIC, 1 MG/DOSE,) 2 MG/1.5ML SOPN Inject 1 mg into the skin once a week. 3 mL 2  . Simethicone (GAS-X PO) Take 1 tablet by mouth 4 (four) times daily.    Marland Kitchen SYNTHROID 200 MCG tablet Take 1 tablet by mouth x 6 days and 2 tablets by mouth on day 7 (Patient taking differently: Take 1 tablet by mouth x 6 days and 1.5 tablets by mouth on day 7) 34 tablet 2  . triamcinolone (KENALOG) 0.1 % paste Use as directed 1 application in the mouth or throat 2 (two) times daily. To affected areas 5 g 1  . valACYclovir (VALTREX) 1000 MG tablet TAKE 1 TABLET(1000 MG) BY MOUTH DAILY 30 tablet 5  . vitamin C (ASCORBIC ACID) 500 MG tablet Take 250 mg by mouth daily. Takes 2 a day    . VYVANSE 60 MG capsule Take 60 mg by mouth  every morning.   0  . zaleplon (SONATA) 10 MG capsule Take 20 mg by mouth at bedtime as needed for sleep.    . nitroGLYCERIN (NITROSTAT) 0.4 MG SL tablet Place 1  tablet (0.4 mg total) under the tongue every 5 (five) minutes as needed for chest pain. 25 tablet 3   No current facility-administered medications on file prior to visit.     Review of Systems  Constitutional: Positive for fatigue. Negative for activity change, appetite change, fever and unexpected weight change.  HENT: Negative for congestion, ear pain, rhinorrhea, sinus pressure and sore throat.   Eyes: Negative for pain, redness and visual disturbance.  Respiratory: Negative for cough, shortness of breath and wheezing.   Cardiovascular: Negative for chest pain and palpitations.  Gastrointestinal: Positive for constipation and nausea. Negative for abdominal pain, blood in stool and diarrhea.  Endocrine: Negative for polydipsia and polyuria.  Genitourinary: Negative for dysuria, frequency and urgency.  Musculoskeletal: Positive for arthralgias, back pain and myalgias.  Skin: Negative for pallor and rash.  Allergic/Immunologic: Negative for environmental allergies.  Neurological: Positive for weakness. Negative for dizziness, syncope, light-headedness and headaches.       Poor balance  Generalized weakness  Hematological: Negative for adenopathy. Does not bruise/bleed easily.  Psychiatric/Behavioral: Positive for dysphoric mood. Negative for decreased concentration and suicidal ideas. The patient is nervous/anxious.        Objective:   Physical Exam Constitutional:      General: She is not in acute distress.    Appearance: Normal appearance. She is well-developed. She is obese. She is not ill-appearing or diaphoretic.  HENT:     Head: Normocephalic and atraumatic.     Right Ear: Tympanic membrane, ear canal and external ear normal.     Left Ear: Tympanic membrane, ear canal and external ear normal.     Nose: Nose normal. No congestion.     Mouth/Throat:     Mouth: Mucous membranes are moist.     Pharynx: Oropharynx is clear. No posterior oropharyngeal erythema.  Eyes:      General: No scleral icterus.    Extraocular Movements: Extraocular movements intact.     Conjunctiva/sclera: Conjunctivae normal.     Pupils: Pupils are equal, round, and reactive to light.  Neck:     Thyroid: No thyromegaly.     Vascular: No carotid bruit or JVD.  Cardiovascular:     Rate and Rhythm: Normal rate and regular rhythm.     Pulses: Normal pulses.     Heart sounds: Normal heart sounds. No gallop.   Pulmonary:     Effort: Pulmonary effort is normal. No respiratory distress.     Breath sounds: Normal breath sounds. No wheezing.     Comments: Good air exch Chest:     Chest wall: No tenderness.  Abdominal:     General: Bowel sounds are normal. There is no distension or abdominal bruit.     Palpations: Abdomen is soft. There is no mass.     Tenderness: There is no abdominal tenderness.     Hernia: No hernia is present.     Comments: Pronounced central obesity  Genitourinary:    Comments: Chest wall-no masses or tenderness (past mastectomy) Nl axillae     Musculoskeletal:        General: No tenderness. Normal range of motion.     Cervical back: Normal range of motion and neck supple. No rigidity. No muscular tenderness.     Right lower leg: No edema.  Left lower leg: No edema.  Lymphadenopathy:     Cervical: No cervical adenopathy.  Skin:    General: Skin is warm and dry.     Coloration: Skin is not pale.     Findings: No erythema or rash.     Comments: Solar lentigines diffusely   Neurological:     Mental Status: She is alert. Mental status is at baseline.     Cranial Nerves: No cranial nerve deficit.     Motor: No abnormal muscle tone.     Coordination: Coordination normal.     Gait: Gait normal.     Deep Tendon Reflexes: Reflexes are normal and symmetric. Reflexes normal.  Psychiatric:        Mood and Affect: Mood normal. Affect is blunt.        Cognition and Memory: Cognition and memory normal.     Comments: Baseline slt blunted affect  Pleasant             Assessment & Plan:   Problem List Items Addressed This Visit      Cardiovascular and Mediastinum   Essential hypertension    bp in fair control at this time  BP Readings from Last 1 Encounters:  04/23/19 136/84   No changes needed Most recent labs reviewed  Disc lifstyle change with low sodium diet and exercise        Supraventricular tachycardia ([place])    No events recently  Sees cardiology        Digestive   Ulcerative colitis ([place])    Continues care with GI Overall stable  Colonoscopy 4/20-next 2 y recall  Has contemplated colectomy         Endocrine   DM (diabetes mellitus) ([place]) (Chronic)    Sees Dr Dwyane Dee for care  Lab Results  Component Value Date   HGBA1C 6.2 (A) 02/05/2019   Send for most recent eye exam      Hyperlipidemia associated with type 2 diabetes mellitus ([place])    Disc goals for lipids and reasons to control them Rev last labs with pt Rev low sat fat diet in detail  Taking atorvastatin max dose and zetia  LDL 74 -fair control  Trig up due to high glucose  H/o CAD      Hypothyroid    Takes brand synthroid 200 daily for 6 d and 300 on day 7 Sees endocrinology Dr Dwyane Dee Lab Results  Component Value Date   TSH 3.53 04/21/2019   no clinical changes       Uncontrolled type 2 diabetes mellitus with hyperglycemia, with long-term current use of insulin [facility])    Sees endocrinology Dr Dwyane Dee        Musculoskeletal and Integument   Osteopenia    dexa 9/18 -openia of FN Seen by gyn One fall and no fractures  D level is 39.9  She wants to delay next dexa          Other   Vitamin D deficiency    Vitamin D level is therapeutic with current supplementation Disc importance of this to bone and overall health Level 39.9 Has osteopenia      Bipolar I disorder, most recent episode depressed ([place])    Under care of Dr Lajoyce Corners for psychiatry Fairly stable  Taking lamictal and lithium (stable renal fx) Generic sonata for  sleep prn      Routine general medical examination at a health care facility - Primary    Reviewed health habits including diet and  exercise and skin cancer prevention Reviewed appropriate screening tests for age  Also reviewed health mt list, fam hx and immunization status , as well as social and family history   See HPI Labs reviewed  Declined dexa for now  No mammogram due to past mastectomy  Planning to get covid vaccines  Also considering shingrix if covered  Sees gyn  Colonoscopy utd  Strongly enc wt loss  Medically complex - disc fall prevention       History of breast cancer    No clinical changes  S/p mastectomy Followed by oncology      Obesity (BMI 30-39.9)    Discussed how this problem influences overall health and the risks it imposes  Reviewed plan for weight loss with lower calorie diet (via better food choices and also portion control or program like weight watchers) and exercise building up to or more than 30 minutes 5 days per week including some aerobic activity          Other Visit Diagnoses    Bipolar affective disorder, remission status unspecified ([place])       Atherosclerosis of native coronary artery of native heart with stable angina pectoris ([facility])   (Chronic)

## 2019-04-25 NOTE — Assessment & Plan Note (Signed)
Vitamin D level is therapeutic with current supplementation Disc importance of this to bone and overall health Level 39.9 Has osteopenia

## 2019-04-25 NOTE — Assessment & Plan Note (Signed)
bp in fair control at this time  BP Readings from Last 1 Encounters:  04/23/19 136/84   No changes needed Most recent labs reviewed  Disc lifstyle change with low sodium diet and exercise

## 2019-04-25 NOTE — Assessment & Plan Note (Signed)
Continues care with GI Overall stable  Colonoscopy 4/20-next 2 y recall  Has contemplated colectomy

## 2019-04-25 NOTE — Assessment & Plan Note (Signed)
Reviewed health habits including diet and exercise and skin cancer prevention Reviewed appropriate screening tests for age  Also reviewed health mt list, fam hx and immunization status , as well as social and family history   See HPI Labs reviewed  Declined dexa for now  No mammogram due to past mastectomy  Planning to get covid vaccines  Also considering shingrix if covered  Sees gyn  Colonoscopy utd  Strongly enc wt loss  Medically complex - disc fall prevention

## 2019-04-25 NOTE — Assessment & Plan Note (Signed)
Sees Dr Dwyane Dee for care  Lab Results  Component Value Date   HGBA1C 6.2 (A) 02/05/2019   Send for most recent eye exam

## 2019-04-25 NOTE — Assessment & Plan Note (Signed)
Continues psychiatric care with Dr Lajoyce Corners  Fairly stable  Taking lamictal Has some days of increased energy  Takes sonata for sleep

## 2019-04-25 NOTE — Assessment & Plan Note (Signed)
No events recently  [facility] cardiology

## 2019-04-25 NOTE — Assessment & Plan Note (Signed)
Discussed how this problem influences overall health and the risks it imposes  Reviewed plan for weight loss with lower calorie diet (via better food choices and also portion control or program like weight watchers) and exercise building up to or more than 30 minutes 5 days per week including some aerobic activity    

## 2019-04-25 NOTE — Assessment & Plan Note (Addendum)
Under care of Dr Lajoyce Corners for psychiatry Fairly stable  Taking lamictal and lithium (stable renal fx) Generic sonata for sleep prn

## 2019-04-25 NOTE — Assessment & Plan Note (Signed)
No clinical changes  S/p mastectomy Followed by oncology

## 2019-04-25 NOTE — Assessment & Plan Note (Signed)
Sees endocrinology Dr Dwyane Dee

## 2019-04-25 NOTE — Assessment & Plan Note (Signed)
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

## 2019-04-25 NOTE — Assessment & Plan Note (Signed)
Takes brand synthroid 200 daily for 6 d and 300 on day 7 Sees endocrinology Dr Dwyane Dee Lab Results  Component Value Date   TSH 3.53 04/21/2019   no clinical changes

## 2019-04-25 NOTE — Assessment & Plan Note (Signed)
Disc goals for lipids and reasons to control them Rev last labs with pt Rev low sat fat diet in detail  Taking atorvastatin max dose and zetia  LDL 74 -fair control  Trig up due to high glucose  H/o CAD

## 2019-04-29 ENCOUNTER — Other Ambulatory Visit: Payer: Self-pay

## 2019-04-29 MED ORDER — CONTOUR NEXT MONITOR W/DEVICE KIT: 1.0000 | PACK | Freq: Two times a day (BID) | 0 refills | Status: DC

## 2019-04-30 ENCOUNTER — Other Ambulatory Visit: Payer: Self-pay | Admitting: *Deleted

## 2019-04-30 ENCOUNTER — Other Ambulatory Visit: Payer: Self-pay

## 2019-04-30 MED ORDER — OZEMPIC (1 MG/DOSE) 2 MG/1.5ML ~~LOC~~ SOPN: 1.0000 mg | PEN_INJECTOR | SUBCUTANEOUS | 2 refills | Status: DC

## 2019-04-30 MED ORDER — GABAPENTIN 300 MG PO CAPS: ORAL_CAPSULE | ORAL | 5 refills | Status: DC

## 2019-04-30 NOTE — Telephone Encounter (Signed)
Last filled on 03/24/19 #90 caps with 0 refills, CPE was on 04/23/19, please advise   [facility] in [place]

## 2019-05-03 ENCOUNTER — Other Ambulatory Visit: Payer: Self-pay | Admitting: Endocrinology

## 2019-05-04 ENCOUNTER — Other Ambulatory Visit: Payer: Self-pay

## 2019-05-04 MED ORDER — CANAGLIFLOZIN 300 MG PO TABS: ORAL_TABLET | ORAL | 3 refills | Status: DC

## 2019-05-06 ENCOUNTER — Ambulatory Visit (INDEPENDENT_AMBULATORY_CARE_PROVIDER_SITE_OTHER): Payer: BC Managed Care – PPO | Admitting: Psychology

## 2019-05-06 DIAGNOSIS — F3131 Bipolar disorder, current episode depressed, mild: Secondary | ICD-10-CM

## 2019-05-11 ENCOUNTER — Other Ambulatory Visit: Payer: Self-pay

## 2019-05-11 ENCOUNTER — Encounter: Payer: Self-pay | Admitting: Dermatology

## 2019-05-11 ENCOUNTER — Ambulatory Visit (INDEPENDENT_AMBULATORY_CARE_PROVIDER_SITE_OTHER): Payer: BC Managed Care – PPO | Admitting: Dermatology

## 2019-05-11 DIAGNOSIS — D225 Melanocytic nevi of trunk: Secondary | ICD-10-CM

## 2019-05-11 DIAGNOSIS — L814 Other melanin hyperpigmentation: Secondary | ICD-10-CM

## 2019-05-11 DIAGNOSIS — L738 Other specified follicular disorders: Secondary | ICD-10-CM

## 2019-05-11 DIAGNOSIS — D1800 Hemangioma unspecified site: Secondary | ICD-10-CM

## 2019-05-11 DIAGNOSIS — D229 Melanocytic nevi, unspecified: Secondary | ICD-10-CM

## 2019-05-11 DIAGNOSIS — Z1283 Encounter for screening for malignant neoplasm of skin: Secondary | ICD-10-CM | POA: Diagnosis not present

## 2019-05-11 DIAGNOSIS — L578 Other skin changes due to chronic exposure to nonionizing radiation: Secondary | ICD-10-CM

## 2019-05-11 DIAGNOSIS — Z86018 Personal history of other benign neoplasm: Secondary | ICD-10-CM

## 2019-05-11 DIAGNOSIS — B009 Herpesviral infection, unspecified: Secondary | ICD-10-CM

## 2019-05-11 DIAGNOSIS — D2221 Melanocytic nevi of right ear and external auricular canal: Secondary | ICD-10-CM | POA: Diagnosis not present

## 2019-05-11 DIAGNOSIS — L821 Other seborrheic keratosis: Secondary | ICD-10-CM

## 2019-05-11 DIAGNOSIS — L219 Seborrheic dermatitis, unspecified: Secondary | ICD-10-CM

## 2019-05-11 MED ORDER — KETOCONAZOLE 2 % EX CREA: TOPICAL_CREAM | CUTANEOUS | 2 refills | Status: DC

## 2019-05-11 NOTE — Patient Instructions (Signed)
Recommend daily broad spectrum sunscreen SPF 30+ to sun-exposed areas, reapply every 2 hours as needed. Call for new or changing lesions.  

## 2019-05-11 NOTE — Progress Notes (Signed)
Follow-Up Visit   Subjective  Lindsay Fry is a 59 y.o. female who presents for the following: Annual Exam.  Patient here today for TBSE. She has a history of dysplastic nevi. Patient noticed a tingling feeling at the right upper inner thigh because she does have a history of HSV. She takes 1 gm Valtrex daily.  The following portions of the chart were reviewed this encounter and updated as appropriate:     Review of Systems:  No other skin or systemic complaints except as noted in HPI or Assessment and Plan.  Objective  Well appearing patient in no apparent distress; mood and affect are within normal limits.  A full examination was performed including scalp, head, eyes, ears, nose, lips, neck, chest, axillae, abdomen, back, buttocks, bilateral upper extremities, bilateral lower extremities, hands, feet, fingers, toes, fingernails, and toenails. All findings within normal limits unless otherwise noted below.  Objective  Right Upper Medial Thigh: Nothing visible on exam today, patient noticed a tingling sensation this am.  Objective  Right paraspinal mid Upper Back, R spinal upper back, L sternum, L post neck (5): Scar with no evidence of recurrence.   Objective  spinal upper Back: 10 x 67m brown speckled papule  Right Lower Back: 7 x 339mmed dark brown speckled papule  Right lower sternum: 44m96med dark brown macule, darker center  Right Earlobe: 5mm58mown macule  Images              Objective  face: Numerous small yellow papules with a central dell.   Objective  nasal alar crease, marrionette lines/oral commisures: Erythema, mild scale  Assessment & Plan  HSV-2 infection Right Upper Medial Thigh  May increase valtrex to 2g by mouth then 2g by mouth 12 hours later, then resume normal daily dosing as prescribed by PCP.   History of dysplastic nevus (5) Right paraspinal mid Upper Back, R spinal upper back, L sternum, L post neck  Clear. Observe for  recurrence. Call clinic for new or changing moles.  Recommend regular skin exams, daily broad-spectrum spf 30+ sunscreen use, and photoprotection.      Nevus (4) Right Earlobe; Right lower sternum; Right Lower Back; spinal upper Back  Benign-appearing.  Observation.  Call clinic for new or changing moles.  Recommend daily use of broad spectrum spf 30+ sunscreen to sun-exposed areas.     Sebaceous hyperplasia face  Discussed ED of lesions, noncovered procedure, may recur.  Pt may schedule in future.  Seborrheic dermatitis nasal alar crease, marrionette lines/oral commisures  Cont ketoconazole 2% cream qd-bid as needed   Ordered Medications: ketoconazole (NIZORAL) 2 % cream  Lentigines - Scattered tan macules - Discussed due to sun exposure - Benign, observe - Call for any changes  Seborrheic Keratoses - Stuck-on, waxy, tan-brown papules and plaques  - Discussed benign etiology and prognosis. - Observe - Call for any changes  Melanocytic Nevi - Tan-brown and/or pink-flesh-colored symmetric macules and papules - Benign appearing on exam today - Observation - Call clinic for new or changing moles - Recommend daily use of broad spectrum spf 30+ sunscreen to sun-exposed areas.   Hemangiomas - Red papules - Discussed benign nature - Observe - Call for any changes  Actinic Damage - diffuse scaly erythematous macules with underlying dyspigmentation - Recommend daily broad spectrum sunscreen SPF 30+ to sun-exposed areas, reapply every 2 hours as needed.  - Call for new or changing lesions.  Skin cancer screening performed today.  Return in about  1 year (around 05/10/2020) for TBSE.  Graciella Belton, RMA, am acting as scribe for Brendolyn Patty, MD .  Documentation: I have reviewed the above documentation for accuracy and completeness, and I agree with the above.  Brendolyn Patty, MD

## 2019-05-24 ENCOUNTER — Other Ambulatory Visit: Payer: Self-pay | Admitting: Internal Medicine

## 2019-06-01 ENCOUNTER — Ambulatory Visit (INDEPENDENT_AMBULATORY_CARE_PROVIDER_SITE_OTHER): Payer: Self-pay | Admitting: Psychology

## 2019-06-01 DIAGNOSIS — F3131 Bipolar disorder, current episode depressed, mild: Secondary | ICD-10-CM

## 2019-06-02 ENCOUNTER — Other Ambulatory Visit: Payer: BC Managed Care – PPO

## 2019-06-03 ENCOUNTER — Other Ambulatory Visit (INDEPENDENT_AMBULATORY_CARE_PROVIDER_SITE_OTHER): Payer: BC Managed Care – PPO

## 2019-06-03 DIAGNOSIS — E890 Postprocedural hypothyroidism: Secondary | ICD-10-CM | POA: Diagnosis not present

## 2019-06-03 DIAGNOSIS — E119 Type 2 diabetes mellitus without complications: Secondary | ICD-10-CM

## 2019-06-03 DIAGNOSIS — E559 Vitamin D deficiency, unspecified: Secondary | ICD-10-CM | POA: Diagnosis not present

## 2019-06-03 DIAGNOSIS — E7800 Pure hypercholesterolemia, unspecified: Secondary | ICD-10-CM

## 2019-06-03 LAB — COMPREHENSIVE METABOLIC PANEL
ALT: 36 U/L — ABNORMAL HIGH (ref 0–35)
AST: 29 U/L (ref 0–37)
Albumin: 4.7 g/dL (ref 3.5–5.2)
Alkaline Phosphatase: 84 U/L (ref 39–117)
BUN: 9 mg/dL (ref 6–23)
CO2: 30 mEq/L (ref 19–32)
Calcium: 10.5 mg/dL (ref 8.4–10.5)
Chloride: 103 mEq/L (ref 96–112)
Creatinine, Ser: 0.83 mg/dL (ref 0.40–1.20)
GFR: 69.92 mL/min (ref >60.00)
Glucose, Bld: 161 mg/dL — ABNORMAL HIGH (ref 70–99)
Potassium: 4.5 mEq/L (ref 3.5–5.1)
Sodium: 140 mEq/L (ref 135–145)
Total Bilirubin: 0.5 mg/dL (ref 0.2–1.2)
Total Protein: 7 g/dL (ref 6.0–8.3)

## 2019-06-03 LAB — HEMOGLOBIN A1C: Hgb A1c MFr Bld: 5.9 % (ref 4.6–6.5)

## 2019-06-03 LAB — LIPID PANEL
Cholesterol: 298 mg/dL — ABNORMAL HIGH (ref 0–200)
HDL: 53.2 mg/dL (ref >39.00)
NonHDL: 244.65
Total CHOL/HDL Ratio: 6
Triglycerides: 362 mg/dL — ABNORMAL HIGH (ref 0.0–149.0)
VLDL: 72.4 mg/dL — ABNORMAL HIGH (ref 0.0–40.0)

## 2019-06-03 LAB — VITAMIN D 25 HYDROXY (VIT D DEFICIENCY, FRACTURES): VITD: 41.98 ng/mL (ref 30.00–100.00)

## 2019-06-03 LAB — TSH: TSH: 3.04 u[IU]/mL (ref 0.35–4.50)

## 2019-06-03 LAB — LDL CHOLESTEROL, DIRECT: Direct LDL: 191 mg/dL

## 2019-06-03 LAB — T4, FREE: Free T4: 0.73 ng/dL (ref 0.60–1.60)

## 2019-06-04 ENCOUNTER — Encounter: Payer: Self-pay | Admitting: Internal Medicine

## 2019-06-04 ENCOUNTER — Other Ambulatory Visit: Payer: Self-pay

## 2019-06-07 ENCOUNTER — Other Ambulatory Visit: Payer: Self-pay

## 2019-06-07 ENCOUNTER — Ambulatory Visit (INDEPENDENT_AMBULATORY_CARE_PROVIDER_SITE_OTHER): Payer: BC Managed Care – PPO | Admitting: Endocrinology

## 2019-06-07 ENCOUNTER — Encounter: Payer: Self-pay | Admitting: Endocrinology

## 2019-06-07 VITALS — BP 136/80 | HR 103 | Ht 65.0 in | Wt 199.5 lb

## 2019-06-07 DIAGNOSIS — E1169 Type 2 diabetes mellitus with other specified complication: Secondary | ICD-10-CM

## 2019-06-07 DIAGNOSIS — E669 Obesity, unspecified: Secondary | ICD-10-CM

## 2019-06-07 DIAGNOSIS — E782 Mixed hyperlipidemia: Secondary | ICD-10-CM | POA: Diagnosis not present

## 2019-06-07 DIAGNOSIS — E890 Postprocedural hypothyroidism: Secondary | ICD-10-CM | POA: Diagnosis not present

## 2019-06-07 MED ORDER — OZEMPIC (1 MG/DOSE) 2 MG/1.5ML ~~LOC~~ SOPN: 1.0000 mg | PEN_INJECTOR | SUBCUTANEOUS | 2 refills | Status: DC

## 2019-06-07 NOTE — Progress Notes (Signed)
Patient ID: Lindsay Fry, female   DOB: Aug 19, 1960, 59 y.o.   MRN: [id number]   Reason for Appointment:   followup visit  History of Present Illness:  DIABETES:  Prior history: With her high A1c of 6.7 in 03/2014 she had an abnormal glucose tolerance test indicating diabetes and 1 hour glucose of 300 She was started on metformin; however even with 1500 mg of metformin ER  blood sugars were not controlled especially fasting Subsequently with taking Janumet XR 100/1000 daily her blood sugars were much better Because of her weight gain and A1c going up to 7.3 along with occasional readings over 200 she was started on Victoza in 11/16 She has had progressive hyperglycemia previously and A1c was up to 8.6% in 4/17  She was started on Levemir insulin in 08/2015 because of progressive rise in blood sugars and inability to control with 3 other agents   Non-insulin hypoglycemic drugs: Ozempic 1 mg weekly; metformin ER 2000 mg daily, Invokana 300 mg daily  INSULIN regimen: Levemir 51 units at bedtime  A1c is unexpectedly low at 5.9, was 6.2  Current management, blood sugar patterns and problems identified:  She has still mildly increased fasting blood sugars at times and not much different than before  However has checked only a few readings in the last month and not after meals  She has recently forgotten to take her evening insulin and fasting reading was higher because of this  No side effects with Invokana and Ozempic which she is taking regularly  She is not eating as many meats lately  She is trying to cut back on regular soft drinks  Weight is slightly lower  Is doing a little more exercise and walking and is planning to start water exercises   GLUCOSE readings from review of the CONTOUR monitor download and averages for 30 days:  Recent blood sugar fasting range 93-144, previous average 123    Weight history:  Wt Readings from Last 3 Encounters:  06/07/19  199 lb 8 oz (90.5 kg)  04/23/19 204 lb 9 oz (92.8 kg)  02/25/19 202 lb (91.6 kg)   Previous consultation with dietitian: 2016    LABS:  Lab Results  Component Value Date   HGBA1C 5.9 06/03/2019   HGBA1C 6.2 (A) 02/05/2019   HGBA1C 6.5 09/28/2018   Lab Results  Component Value Date   MICROALBUR 2.2 (H) 03/31/2018   [place] 74 02/05/2019   CREATININE 0.83 06/03/2019   HYPOTHYROIDISM: This was first diagnosed in 1992 after her treatment for Graves' disease with I-131 She has been on relatively large doses of thyroxine supplements She was on 224 mcg since 11/13 and her TSH was normal in 3/14 Subjectively difficult to assess her thyroid since she tends to have fatigue chronically.  In 2014 her dose was reduced to 200 mcg daily and her dose has been fluctuating since then She had required periodic increase in dosage including in 05/2014 when her TSH was 20  She is  on 200 g dosage, on the brand name Synthroid Previously when TSH was 14.5 she was told to take 8 tablets a week However subsequently since TSH was low normal in 1/21 she was told to take only 1/2 tablet extra per week which she is doing on Sundays  She feels fairly good now, no unusual fatigue Her weight has fluctuated but not significantly  She has been consistently regular with the Synthroid every day before breakfast on empty stomach  TSH is now stable and normal at 3  Lab Results  Component Value Date   TSH 3.04 06/03/2019   TSH 3.53 04/21/2019   TSH 0.54 02/02/2019   FREET4 0.73 06/03/2019   FREET4 0.85 02/02/2019   FREET4 0.71 09/28/2018     Lab on 06/03/2019  Component Date Value Ref Range Status  . Hgb A1c MFr Bld 06/03/2019 5.9  4.6 - 6.5 % Final   Glycemic Control Guidelines for People with Diabetes:Non Diabetic:  <6%Goal of Therapy: <7%Additional Action Suggested:  >8%   . Sodium 06/03/2019 140  135 - 145 mEq/L Final  . Potassium 06/03/2019 4.5  3.5 - 5.1 mEq/L Final  . Chloride  06/03/2019 103  96 - 112 mEq/L Final  . CO2 06/03/2019 30  19 - 32 mEq/L Final  . Glucose, Bld 06/03/2019 161* 70 - 99 mg/dL Final  . BUN 06/03/2019 9  6 - 23 mg/dL Final  . Creatinine, Ser 06/03/2019 0.83  0.40 - 1.20 mg/dL Final  . Total Bilirubin 06/03/2019 0.5  0.2 - 1.2 mg/dL Final  . Alkaline Phosphatase 06/03/2019 84  39 - 117 U/L Final  . AST 06/03/2019 29  0 - 37 U/L Final  . ALT 06/03/2019 36* 0 - 35 U/L Final  . Total Protein 06/03/2019 7.0  6.0 - 8.3 g/dL Final  . Albumin 06/03/2019 4.7  3.5 - 5.2 g/dL Final  . GFR 06/03/2019 69.92  >60.00 mL/min Final  . Calcium 06/03/2019 10.5  8.4 - 10.5 mg/dL Final  . Cholesterol 06/03/2019 298* 0 - 200 mg/dL Final   ATP III Classification       Desirable:  < 200 mg/dL               Borderline High:  200 - 239 mg/dL          High:  > = 240 mg/dL  . Triglycerides 06/03/2019 362.0* 0.0 - 149.0 mg/dL Final   Normal:  <150 mg/dLBorderline High:  150 - 199 mg/dL  . HDL 06/03/2019 53.20  >39.00 mg/dL Final  . VLDL 06/03/2019 72.4* 0.0 - 40.0 mg/dL Final  . Total CHOL/HDL Ratio 06/03/2019 6   Final                  Men          Women1/2 Average Risk     3.4          3.3Average Risk          5.0          4.42X Average Risk          9.6          7.13X Average Risk          15.0          11.0                      . NonHDL 06/03/2019 244.65   Final   NOTE:  Non-HDL goal should be 30 mg/dL higher than patient's LDL goal (i.e. LDL goal of < 70 mg/dL, would have non-HDL goal of < 100 mg/dL)  . TSH 06/03/2019 3.04  0.35 - 4.50 uIU/mL Final  . Free T4 06/03/2019 0.73  0.60 - 1.60 ng/dL Final   Comment: Specimens from patients who are undergoing biotin therapy and /or ingesting biotin supplements may contain high levels of biotin.  The higher biotin concentration in these specimens interferes with this Free T4 assay.  Specimens that contain high levels  of biotin may cause false high results for this Free T4 assay.  Please interpret results in light of the  total clinical presentation of the patient.    Marland Kitchen VITD 06/03/2019 41.98  30.00 - 100.00 ng/mL Final  . Direct LDL 06/03/2019 191.0  mg/dL Final   Optimal:  <100 mg/dLNear or Above Optimal:  100-129 mg/dLBorderline High:  130-159 mg/dLHigh:  160-189 mg/dLVery High:  >190 mg/dL    Allergies as of 06/07/2019      Reactions   Ephedrine Other (See Comments)   Pt becomes hyper   Aspartame And Phenylalanine Nausea Only   Aspirin    REACTION: aggrivates colitis   Crestor [rosuvastatin]    REACTION: increased lfts   Erythromycin    REACTION: GI upset   Nsaids    REACTION: aggrivate colitis   Oxycodone Other (See Comments)   Panic Attack      Medication List       Accurate as of Jun 07, 2019  3:14 PM. If you have any questions, ask your nurse or doctor.        acetaminophen 650 MG CR tablet Commonly known as: TYLENOL Take 1,300 mg by mouth 2 (two) times daily.   amantadine 100 MG capsule Commonly known as: SYMMETREL Take 100 mg by mouth 2 (two) times daily.   atorvastatin 80 MG tablet Commonly known as: LIPITOR TAKE 1 TABLET(80 MG) BY MOUTH DAILY   Bayer Microlet Lancets lancets USE AS DIRECTED TO CHECK BLOOD SUGAR TWICE DAILY   Calcium Plus Vitamin D3 600-800 MG-UNIT Tabs Generic drug: Calcium Carb-Cholecalciferol Take 1 tablet by mouth daily.   canagliflozin 300 MG Tabs tablet Commonly known as: Invokana TAKE 1 TABLET(300 MG) BY MOUTH DAILY BEFORE BREAKFAST   clopidogrel 75 MG tablet Commonly known as: PLAVIX TAKE 1 TABLET(75 MG) BY MOUTH DAILY   co-enzyme Q-10 50 MG capsule Take 50 mg by mouth daily.   Contour Next Monitor w/Device Kit 1 each by Does not apply route in the morning and at bedtime. Use Contour Next meter to check blood sugar twice daily.   CULTURELLE DIGESTIVE DAILY PO Take 1 capsule by mouth daily.   diazepam 10 MG tablet Commonly known as: VALIUM Take 2.5-10 mg by mouth as needed for anxiety.   diltiazem 240 MG 24 hr capsule Commonly  known as: CARDIZEM CD TAKE 1 CAPSULE(240 MG) BY MOUTH DAILY   diltiazem 30 MG tablet Commonly known as: CARDIZEM Take 1 tablet (30 mg) by mouth up to three times a day as needed for a blood pressure > 150 (top number)   diphenoxylate-atropine 2.5-0.025 MG tablet Commonly known as: Lomotil Take 1-2 tabs daily as needed for diarrhea   docusate sodium 100 MG capsule Commonly known as: COLACE Take 100 mg by mouth daily.   ezetimibe 10 MG tablet Commonly known as: Zetia Take 1 tablet (10 mg total) by mouth daily.   famotidine 40 MG tablet Commonly known as: PEPCID Take 40 mg by mouth 2 (two) times daily.   FIBER SELECT GUMMIES PO Take 3 capsules by mouth daily.   gabapentin 300 MG capsule Commonly known as: NEURONTIN TAKE 1 CAPSULE(300 MG) BY MOUTH THREE TIMES DAILY   GAS-X PO Take 1 tablet by mouth 4 (four) times daily.   glucose blood test strip Commonly known as: Visual merchandiser Next Test USE AS DIRECTED TO CHECK BLOOD SUGAR TWICE DAILY   Insulin Pen Needle 31G X 5 MM Misc Commonly known as:  B-D UF III MINI PEN NEEDLES USE FOR INJECTIONS TWICE DAILY   B-D UF III MINI PEN NEEDLES 31G X 5 MM Misc Generic drug: Insulin Pen Needle USE FOR INJECTIONS TWICE DAILY   ketoconazole 2 % cream Commonly known as: NIZORAL Apply to affected areas one to two times daily as needed.   lamoTRIgine 200 MG tablet Commonly known as: LAMICTAL Take 400 mg by mouth daily.   latanoprost 0.005 % ophthalmic solution Commonly known as: XALATAN Place 1 drop into both eyes 2 (two) times a week.   letrozole 2.5 MG tablet Commonly known as: FEMARA TAKE 1 TABLET(2.5 MG) BY MOUTH DAILY   Levemir FlexTouch 100 UNIT/ML FlexPen Generic drug: insulin detemir Inject 51 units under the skin once daily in the evening.   linaclotide 290 MCG Caps capsule Commonly known as: LINZESS Take 1 capsule (290 mcg total) by mouth daily before breakfast.   lithium carbonate 450 MG CR tablet Commonly known  as: ESKALITH Take 900 mg by mouth at bedtime.   Melatonin 10 MG Caps Take 1 capsule by mouth at bedtime.   mesalamine 1.2 g EC tablet Commonly known as: LIALDA Take 2 tablets (2.4 g total) by mouth daily. Patients need office visit for further refills   MIRALAX PO Take 1 Scoop by mouth daily as needed.   multivitamin tablet Take 1 tablet by mouth daily.   nitroGLYCERIN 0.4 MG SL tablet Commonly known as: NITROSTAT Place 1 tablet (0.4 mg total) under the tongue every 5 (five) minutes as needed for chest pain.   Omega-3 Krill Oil 500 MG Caps Take 500 mg by mouth.   ondansetron 4 MG disintegrating tablet Commonly known as: ZOFRAN-ODT Take 1 tablet (4 mg total) by mouth every 6 (six) hours as needed for nausea or vomiting.   Ozempic (1 MG/DOSE) 2 MG/1.5ML Sopn Generic drug: Semaglutide (1 MG/DOSE) Inject 1 mg into the skin once a week.   pantoprazole 40 MG tablet Commonly known as: PROTONIX TAKE 1 TABLET(40 MG) BY MOUTH TWICE DAILY   PROBIOTIC DAILY PO Take by mouth.   QUEtiapine 400 MG 24 hr tablet Commonly known as: SEROQUEL XR Take 800 mg by mouth at bedtime.   Synthroid 200 MCG tablet Generic drug: levothyroxine Take 1 tablet by mouth x 6 days and 1.5 tablets by mouth on day 7   triamcinolone 0.1 % paste Commonly known as: KENALOG Use as directed 1 application in the mouth or throat 2 (two) times daily. To affected areas   valACYclovir 1000 MG tablet Commonly known as: VALTREX TAKE 1 TABLET(1000 MG) BY MOUTH DAILY   VITAMIN B-12 PO 3000 mcg-- 2 gummies daily   vitamin C 500 MG tablet Commonly known as: ASCORBIC ACID Take 250 mg by mouth daily. Takes 2 a day   Vitamin C Chew Chew 1 tablet by mouth daily.   Vitamin D3 50 MCG (2000 UT) capsule Take 2,000 Units by mouth 2 (two) times daily.   Vyvanse 60 MG capsule Generic drug: lisdexamfetamine Take 60 mg by mouth every morning.   zaleplon 10 MG capsule Commonly known as: SONATA Take 20 mg by mouth  at bedtime as needed for sleep.       Allergies:  Allergies  Allergen Reactions  . Ephedrine Other (See Comments)    Pt becomes hyper  . Aspartame And Phenylalanine Nausea Only  . Aspirin     REACTION: aggrivates colitis  . Crestor [Rosuvastatin]     REACTION: increased lfts  . Erythromycin  REACTION: GI upset  . Nsaids     REACTION: aggrivate colitis  . Oxycodone Other (See Comments)    Panic Attack    Past Medical History:  Diagnosis Date  . ADD (attention deficit disorder) 11/19/2012  . Allergic rhinitis, cause unspecified   . Anxiety state, unspecified   . Arthritis   . Asymptomatic postmenopausal status (age-related) (natural)   . Atypical hyperplasia of left breast 2000  . Atypical mole 03/16/2018   R paraspinal mid upper back, excision  . Atypical mole 02/17/2018   R spinal upper back, moderate  . Atypical mole 08/11/2017   L sternum, excision  . Atypical mole 05/27/2017   L post neck, moderate  . Benign neoplasm of other and unspecified site of the digestive system   . Bipolar disorder ([place])   . Breast cancer ([place])    Left Breast 84m invasive CA left uiq, dcis left uoq and a lot ADH, ALH in both breasts.  .Marland KitchenCAD (coronary artery disease)    a. 08/2014 NSTEMI/Cath: LM nl, LAD 50p, D1/D2 min irregs, LCX nl, OM1 40, LPDA nl, RI 99ost (2.045mvessel->med Rx), RCA nl, AM 60, nl EF; b. 11/2016 MV: EF 74%, no ischemia (performed 2/2 dyspnea & new inf TWI).  . Marland Kitchenarcinoma of left breast ([facility]2016  . Cataract   . Chronic diastolic CHF (congestive heart failure) ([facility]   a. 08/2014 Echo: EF 60-65%, Gr 1 DD, nl LA.  . Marland Kitchenlotting disorder ([facility]   on blood thiner, Plavix  . Cyst    on Achilles tendon  . Depression   . Diabetes mellitus ([facility] [place]   frank  . Dysuria   . Edema   . Family history of malignant neoplasm of gastrointestinal tract   . Fibromyalgia   . GERD (gastroesophageal reflux disease)   . Glaucoma    pre glaucoma, 05/14/2018 pt. denies glaucoma  .  Headache    migraines  . Hiatal hernia   . History of alcoholism ([facility]  . History of migraines   . History of ovarian cyst   . Hx of adenomatous colonic polyps   . Insomnia, unspecified   . Myocardial infarction ([facility]8-21-16  . OCD (obsessive compulsive disorder)   . Osteopenia 09/25/2016   Femoral neck T -1.1  9/18  . Other screening mammogram   . Pure hypercholesterolemia   . Rosacea   . Subacute confusional state 11/19/2012  . Tubular adenoma of colon 01/10/11  . Ulcerative colitis, unspecified   . Unspecified asthma(493.90)   . Unspecified essential hypertension   . Unspecified hypothyroidism   . Unspecified vitamin D deficiency   . Vertigo     Past Surgical History:  Procedure Laterality Date  . APPENDECTOMY    . AXILLARY SENTINEL NODE BIOPSY Left 05/23/2014   Procedure: AXILLARY SENTINEL NODE BIOPSY;  Surgeon: SeChristene LyeMD;  Location: [facility] ORS;  Service: General;  Laterality: Left;  . BREAST BIOPSY  Aug 2000   on tamoxifen, atypical hyperplasia  . BREAST BIOPSY Left 04/2014  . BREAST SURGERY Left Aug 2000   lumpectomy/ Dr CrSharlet [place]. BREAST SURGERY Bilateral 05/23/14   Mastectomy  . BUNIONECTOMY Right   . CARDIAC CATHETERIZATION N/A 09/05/2014   Procedure: Left Heart Cath and Coronary Angiography;  Surgeon: MuWellington HampshireMD;  [facility];  Service: Cardiovascular;  Laterality: N/A;  . CARDIAC CATHETERIZATION  09-05-14  . CATARACT EXTRACTION W/ INTRAOCULAR LENS IMPLANT Bilateral   . CESAREAN  SECTION  1996/1997   placenta previa, gest DM, pre-eclampsia  . CHOLECYSTECTOMY  1997   adhesions also  . COLONOSCOPY  01/2001   Ulcerative colitis  . COLONOSCOPY  04/2004   UC, polyp  . COLONOSCOPY  12/08   UC, no polyps  . DEXA  04/1999 and 2010   normal  . ESOPHAGOGASTRODUODENOSCOPY  09/2001   polyp  . exercise stress test  11/2003   negative  . FEMUR FRACTURE SURGERY Left   . MASTECTOMY Bilateral   . MECKEL DIVERTICULUM EXCISION  1999    . NASAL SINUS SURGERY  05/1997  . nuclear stress test  06/2007   negative  . precancerous mole removed    . SIMPLE MASTECTOMY WITH AXILLARY SENTINEL NODE BIOPSY Bilateral 05/23/2014   Procedure: Bilateral simple mastectomy, left sentinel node biopsy ;  Surgeon: Christene Lye, MD;  Location: [facility] ORS;  Service: General;  Laterality: Bilateral;  . Sleep study  11/09   no apnea, but did snore (done by [facility])  . TONSILLECTOMY  1984  . TUBAL LIGATION    . WISDOM TOOTH EXTRACTION  1980's    Family History  Problem Relation Age of Onset  . Coronary artery disease Father   . Colon cancer Father   . Alzheimer's disease Father   . Dementia Father   . Colon polyps Father   . Coronary artery disease Mother   . Hypertension Mother   . Osteoporosis Mother   . Crohn's disease Mother        crohns colitis  . Breast cancer Other        great aunts  . Coronary artery disease Other        Uncle (also AAA)  . Diabetes Other        remote family history  . Stroke Cousin   . Esophageal cancer Neg Hx   . Rectal cancer Neg Hx   . Stomach cancer Neg Hx     Social History:  reports that she quit smoking about 26 years ago. Her smoking use included cigarettes. She has a 1.25 pack-year smoking history. She has never used smokeless tobacco. She reports that she does not drink alcohol or use drugs.  REVIEW Of SYSTEMS:   HYPERCALCEMIA: Her calcium has been previously high, upper normal more recently PTH in the past has been borderline for hyperparathyroidism and is last below 20  She is taking OTC vitamin D3, ? 2000 units and has been on calcium recommended by her oncologist Vitamin D level therapeutic  Bone density in 9/15 shows T score -1.1 at the hip  Lab Results  Component Value Date   PTH 18 10/25/2016   CALCIUM 10.5 06/03/2019   PHOS 2.3 01/29/2007     Lab Results  Component Value Date   CALCIUM 10.5 06/03/2019   PHOS 2.3 01/29/2007   Lab Results  Component Value Date    VD25OH 41.98 06/03/2019   VD25OH 39.94 04/21/2019   VD25OH 38.76 09/28/2018     HYPERTENSION: Blood pressure is controlled, managed by cardiologist.   on Bystolic twice a day and diltiazem Also on Invokana  Lab Results  Component Value Date   CREATININE 0.83 06/03/2019   BUN 9 06/03/2019   NA 140 06/03/2019   K 4.5 06/03/2019   CL 103 06/03/2019   CO2 30 06/03/2019    History of hypercholesterolemia: Management by cardiologist and has been on 80 mg total since her MI LDL the last visit in 1/21 was above 70  and Zetia added  She has much higher LDL level and it had started increasing on her visit with PCP also  She has been on atorvastatin 80 mg dose but she now thinks that she has not refilled this for a few weeks   Diet has been slightly better with less saturated fat  Lab Results  Component Value Date   CHOL 298 (H) 06/03/2019   HDL 53.20 06/03/2019   LDLCALC 74 02/05/2019   LDLDIRECT 191.0 06/03/2019   TRIG 362.0 (H) 06/03/2019   CHOLHDL 6 06/03/2019   Possible fatty liver: Liver functions are recently back to normal   Lab Results  Component Value Date   ALT 36 (H) 06/03/2019   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   Examination:   BP 136/80 (BP Location: Right Arm, Patient Position: Sitting, Cuff Size: Normal)   Pulse (!) 103   Ht 5' 5" (1.651 m)   Wt 199 lb 8 oz (90.5 kg)   LMP 08/23/2006 (Approximate) Comment: tubal ligation  SpO2 97%   BMI 33.20 kg/m       Assessments   DIABETES with obesity:  See history of present illness for evaluation of  current management, blood sugar patterns and problems identified  Her A1c is 5.9  She is on a regimen of basal insulin, Invokana and Ozempic  Although her fasting blood sugars are usually fairly good her A1c is lower than expected Has not checked readings after meals Overall lifestyle has improved since her last visit Also starting to lose a little weight She is being a  little more active also Continues to benefit from max dose Ozempic and Invokana  Her regimen will be continued unchanged Encourage her to check readings after meals and continue to make changes in the diet as above  Needs follow-up microalbumin  Hypothyroidism, post ablative:  TSH is now back to normal, now taking 7-1/2 tablets a week of her 200 mcg Synthroid and will stay on the same  LIPIDS: LDL is markedly increased She now admits that she likely has not refilled her Lipitor prescription even though she is taking the Zetia that was started She will refill her Lipitor and needs follow-up lipids on the next visit   There are no Patient Instructions on file for this visit.    Elayne Snare 06/07/2019, 3:14 PM   Note: This office note was prepared with Dragon voice recognition system technology. Any transcriptional errors that result from this process are unintentional.

## 2019-06-07 NOTE — Patient Instructions (Signed)
Check blood sugars on waking up 4  days a week  Also check blood sugars about 2 hours after meals and do this after different meals by rotation  Recommended blood sugar levels on waking up are 90-130 and about 2 hours after meal is 130-160  Please bring your blood sugar monitor to each visit, thank you  Refill Lipitor

## 2019-06-08 ENCOUNTER — Other Ambulatory Visit: Payer: Self-pay

## 2019-06-08 MED ORDER — EMPAGLIFLOZIN 25 MG PO TABS: 25.0000 mg | ORAL_TABLET | Freq: Every day | ORAL | 2 refills | Status: DC

## 2019-06-08 NOTE — Progress Notes (Signed)
Cardiology Office Note    Date:  06/11/2019   ID:  Lindsay Fry, Lindsay Fry [birth year], MRN [id number]  PCP:  Abner Greenspan, MD  Cardiologist:  Ida Rogue, MD  Electrophysiologist:  None   Chief Complaint: Follow-up  History of Present Illness:   Lindsay Fry is a 59 y.o. female with history of CAD with NSTEMI in 08/2014, fibromyalgia, DM2, ulcerative colitis, HTN, HLD, left-sided breast cancer diagnosed in 2000 status post bilateral mastectomy, obesity, migraine disorder, chronic back pain, GERD, bipolar disorder, and anxiety who presents for follow-up of her CAD.  She was admitted to the hospital in 08/2014 with a NSTEMI.  Cath at that time showed severe ostial ramus intermedius disease which was medically managed secondary to being a small vessel approximately 2 mm in diameter.  She otherwise had nonobstructive disease.  Echo showed an EF of 60 to 65%, no regional wall motion abnormalities, grade 1 diastolic dysfunction, no significant valvular abnormalities, normal RV systolic function, normal PASP.  Lower extremity ABIs in 09/2014 were normal.  Zio patch from 01/2016 showed a predominant rhythm of normal sinus with an average heart rate of 76 bpm (range 56 to 133 bpm), first-degree AV block was present, rare PACs, atrial couplets, PVCs, and ventricular triplets.  No significant arrhythmias were noted.  She was seen in 10/2016 with reported dyspnea on exertion and noted to have new anterior T wave changes.  Subsequent nuclear stress test was nonischemic.  She was last seen virtually in 10/2018 with noted chest pressure that was attributed to stress and anxiety.  BP was elevated at 162/82.  She was not sleeping well.  No further cardiac work-up was recommended.  Her elevated BP was felt to be in the setting of her increased stress/anxiety.  She was advised to take short acting diltiazem 30 mg 3 times daily as needed for systolic blood pressure greater than 150 mmHg.  She was subsequently  evaluated by neurology in 01/2019 for memory disturbance and gait instability dating back to late 2020.  MRI of the brain in 02/2019 was unrevealing.  She indicates the symptoms were possibly in the setting of hyperglycemia with prednisone usage.  Since coming off steroids she feels closer to baseline and has not had any further falls.  She comes in doing well from a cardiac perspective.  No chest pain, dyspnea, palpitations, presyncope, syncope.  She did have a brief episode of dizziness 1 week prior which improved with hydration and eating some food.  She wonders if her blood sugar may have been a little bit low.  No associated palpitations.  No further symptoms since.  Inadvertently she has not been taking atorvastatin since 12/2018.  In this setting, she has noted worsening cholesterol as outlined below.  She has been started on Zetia by endocrinology and is tolerating this well.  No hematochezia or melena.   Labs independently reviewed: 05/2019 - direct LDL 191, TC 298, TG 362, HDL 53, TSH normal, potassium 4.5, BUN 9, serum creatinine 0.83, albumin 4.7, AST 36, ALT normal, A1c 5.9 04/2019 - Hgb 15.8, PLT 329  Past Medical History:  Diagnosis Date  . ADD (attention deficit disorder) 11/19/2012  . Allergic rhinitis, cause unspecified   . Anxiety state, unspecified   . Arthritis   . Asymptomatic postmenopausal status (age-related) (natural)   . Atypical hyperplasia of left breast 2000  . Atypical mole 03/16/2018   R paraspinal mid upper back, excision  . Atypical mole 02/17/2018   R spinal  upper back, moderate  . Atypical mole 08/11/2017   L sternum, excision  . Atypical mole 05/27/2017   L post neck, moderate  . Benign neoplasm of other and unspecified site of the digestive system   . Bipolar disorder ([place])   . Breast cancer ([place])    Left Breast 59m invasive CA left uiq, dcis left uoq and a lot ADH, ALH in both breasts.  .Marland KitchenCAD (coronary artery disease)    a. 08/2014 NSTEMI/Cath: LM nl,  LAD 50p, D1/D2 min irregs, LCX nl, OM1 40, LPDA nl, RI 99ost (2.033mvessel->med Rx), RCA nl, AM 60, nl EF; b. 11/2016 MV: EF 74%, no ischemia (performed 2/2 dyspnea & new inf TWI).  . Marland Kitchenarcinoma of left breast (HCFargo5/19/2016  . Cataract   . Chronic diastolic CHF (congestive heart failure) ([facility]CA[place]   a. 08/2014 Echo: EF 60-65%, Gr 1 DD, nl LA.  . Marland Kitchenlotting disorder (HCGibson[place]   on blood thiner, Plavix  . Cyst    on Achilles tendon  . Depression   . Diabetes mellitus (HCMuldraugh   frank  . Dysuria   . Edema   . Family history of malignant neoplasm of gastrointestinal tract   . Fibromyalgia   . GERD (gastroesophageal reflux disease)   . Glaucoma    pre glaucoma, 05/14/2018 pt. denies glaucoma  . Headache    migraines  . Hiatal hernia   . History of alcoholism (HCHumeston  . History of migraines   . History of ovarian cyst   . Hx of adenomatous colonic polyps   . Insomnia, unspecified   . Myocardial infarction (HCHannibal8-21-16  . OCD (obsessive compulsive disorder)   . Osteopenia 09/25/2016   Femoral neck T -1.1  9/18  . Other screening mammogram   . Pure hypercholesterolemia   . Rosacea   . Subacute confusional state 11/19/2012  . Tubular adenoma of colon 01/10/11  . Ulcerative colitis, unspecified   . Unspecified asthma(493.90)   . Unspecified essential hypertension   . Unspecified hypothyroidism   . Unspecified vitamin D deficiency   . Vertigo     Past Surgical History:  Procedure Laterality Date  . APPENDECTOMY    . AXILLARY SENTINEL NODE BIOPSY Left 05/23/2014   Procedure: AXILLARY SENTINEL NODE BIOPSY;  Surgeon: SeChristene LyeMD;  Location: [facility];  Service: General;  Laterality: Left;  . BREAST BIOPSY  Aug 2000   on tamoxifen, atypical hyperplasia  . BREAST BIOPSY Left 04/2014  . BREAST SURGERY Left Aug 2000   lumpectomy/ Dr CrSharlet Salina. BREAST SURGERY Bilateral 05/23/14   Mastectomy  . BUNIONECTOMY Right   . CARDIAC CATHETERIZATION N/A 09/05/2014   Procedure: Left Heart  Cath and Coronary Angiography;  Surgeon: MuWellington HampshireMD;  [facility];  Service: Cardiovascular;  Laterality: N/A;  . CARDIAC CATHETERIZATION  09-05-14  . CATARACT EXTRACTION W/ INTRAOCULAR LENS IMPLANT Bilateral   . CESAREAN SECTION  1996/1997   placenta previa, gest DM, pre-eclampsia  . CHOLECYSTECTOMY  1997   adhesions also  . COLONOSCOPY  01/2001   Ulcerative colitis  . COLONOSCOPY  04/2004   UC, polyp  . COLONOSCOPY  12/08   UC, no polyps  . DEXA  04/1999 and 2010   normal  . ESOPHAGOGASTRODUODENOSCOPY  09/2001   polyp  . exercise stress test  11/2003   negative  . FEMUR FRACTURE SURGERY Left   . MASTECTOMY Bilateral   . MECKEL DIVERTICULUM EXCISION  1999  .  NASAL SINUS SURGERY  05/1997  . nuclear stress test  06/2007   negative  . precancerous mole removed    . SIMPLE MASTECTOMY WITH AXILLARY SENTINEL NODE BIOPSY Bilateral 05/23/2014   Procedure: Bilateral simple mastectomy, left sentinel node biopsy ;  Surgeon: Christene Lye, MD;  Location: [facility] ORS;  Service: General;  Laterality: Bilateral;  . Sleep study  11/09   no apnea, but did snore (done by [facility])  . TONSILLECTOMY  1984  . TUBAL LIGATION    . WISDOM TOOTH EXTRACTION  1980's    Current Medications: Current Meds  Medication Sig  . acetaminophen (TYLENOL) 650 MG CR tablet Take 1,300 mg by mouth 2 (two) times daily.  Marland Kitchen amantadine (SYMMETREL) 100 MG capsule Take 100 mg by mouth 2 (two) times daily.  . B-D UF III MINI PEN NEEDLES 31G X 5 MM MISC USE FOR INJECTIONS TWICE DAILY  . BAYER MICROLET LANCETS lancets USE AS DIRECTED TO CHECK BLOOD SUGAR TWICE DAILY  . Bioflavonoid Products (VITAMIN C) CHEW Chew 1 tablet by mouth daily.  . Blood Glucose Monitoring Suppl (CONTOUR NEXT MONITOR) w/Device KIT 1 each by Does not apply route in the morning and at bedtime. Use Contour Next meter to check blood sugar twice daily.  . Calcium Carb-Cholecalciferol (CALCIUM PLUS VITAMIN D3) 600-800 MG-UNIT  TABS Take 1 tablet by mouth daily.  . canagliflozin (INVOKANA) 300 MG TABS tablet Take 300 mg by mouth daily before breakfast.  . Cholecalciferol (VITAMIN D3) 50 MCG (2000 UT) capsule Take 2,000 Units by mouth 2 (two) times daily.  . clopidogrel (PLAVIX) 75 MG tablet TAKE 1 TABLET(75 MG) BY MOUTH DAILY  . co-enzyme Q-10 50 MG capsule Take 50 mg by mouth daily.  . Cyanocobalamin (VITAMIN B-12 PO) 3000 mcg-- 2 gummies daily  . diazepam (VALIUM) 10 MG tablet Take 2.5-10 mg by mouth as needed for anxiety.  Marland Kitchen diltiazem (CARDIZEM CD) 240 MG 24 hr capsule TAKE 1 CAPSULE(240 MG) BY MOUTH DAILY  . diltiazem (CARDIZEM) 30 MG tablet Take 1 tablet (30 mg) by mouth up to three times a day as needed for a blood pressure > 150 (top number)  . diphenoxylate-atropine (LOMOTIL) 2.5-0.025 MG tablet Take 1-2 tabs daily as needed for diarrhea  . docusate sodium (COLACE) 100 MG capsule Take 100 mg by mouth daily.  Marland Kitchen ezetimibe (ZETIA) 10 MG tablet Take 1 tablet (10 mg total) by mouth daily.  . famotidine (PEPCID) 40 MG tablet Take 40 mg by mouth 2 (two) times daily.  Marland Kitchen FIBER SELECT GUMMIES PO Take 3 capsules by mouth daily.  Marland Kitchen gabapentin (NEURONTIN) 300 MG capsule TAKE 1 CAPSULE(300 MG) BY MOUTH THREE TIMES DAILY  . glucose blood (BAYER CONTOUR NEXT TEST) test strip USE AS DIRECTED TO CHECK BLOOD SUGAR TWICE DAILY  . insulin detemir (LEVEMIR FLEXTOUCH) 100 UNIT/ML FlexPen Inject 51 units under the skin once daily in the evening.  . Insulin Pen Needle (B-D UF III MINI PEN NEEDLES) 31G X 5 MM MISC USE FOR INJECTIONS TWICE DAILY  . ketoconazole (NIZORAL) 2 % cream Apply to affected areas one to two times daily as needed.  . Lactobacillus-Inulin (CULTURELLE DIGESTIVE DAILY PO) Take 1 capsule by mouth daily.  Marland Kitchen lamoTRIgine (LAMICTAL) 200 MG tablet Take 400 mg by mouth daily.  Marland Kitchen latanoprost (XALATAN) 0.005 % ophthalmic solution Place 1 drop into both eyes 2 (two) times a week.   . letrozole (FEMARA) 2.5 MG tablet TAKE 1  TABLET(2.5 MG) BY MOUTH DAILY  .  linaclotide (LINZESS) 290 MCG CAPS capsule Take 1 capsule (290 mcg total) by mouth daily before breakfast.  . lithium carbonate (ESKALITH) 450 MG CR tablet Take 900 mg by mouth at bedtime.  . Melatonin 10 MG CAPS Take 1 capsule by mouth at bedtime.  . mesalamine (LIALDA) 1.2 g EC tablet Take 2 tablets (2.4 g total) by mouth daily. Patients need office visit for further refills  . Multiple Vitamin (MULTIVITAMIN) tablet Take 1 tablet by mouth daily.    . nitroGLYCERIN (NITROSTAT) 0.4 MG SL tablet Place 1 tablet (0.4 mg total) under the tongue every 5 (five) minutes as needed for chest pain.  . Omega-3 Krill Oil 500 MG CAPS Take 500 mg by mouth.  . ondansetron (ZOFRAN-ODT) 4 MG disintegrating tablet Take 1 tablet (4 mg total) by mouth every 6 (six) hours as needed for nausea or vomiting.  Marland Kitchen OZEMPIC, 1 MG/DOSE, 4 MG/3ML SOPN INJECT 1 MG UNDER THE SKIN ONCE A WEEK  . pantoprazole (PROTONIX) 40 MG tablet TAKE 1 TABLET(40 MG) BY MOUTH TWICE DAILY; patient needs office visit for further refills  . Polyethylene Glycol 3350 (MIRALAX PO) Take 1 Scoop by mouth daily as needed.  . Probiotic Product (PROBIOTIC DAILY PO) Take by mouth.  . QUEtiapine (SEROQUEL XR) 400 MG 24 hr tablet Take 800 mg by mouth at bedtime.  . Simethicone (GAS-X PO) Take 1 tablet by mouth 4 (four) times daily.  Marland Kitchen SYNTHROID 200 MCG tablet Take 1 tablet by mouth x 6 days and 1.5 tablets by mouth on day 7  . triamcinolone (KENALOG) 0.1 % paste Use as directed 1 application in the mouth or throat 2 (two) times daily. To affected areas  . valACYclovir (VALTREX) 1000 MG tablet TAKE 1 TABLET(1000 MG) BY MOUTH DAILY  . vitamin C (ASCORBIC ACID) 500 MG tablet Take 250 mg by mouth daily. Takes 2 a day  . VYVANSE 60 MG capsule Take 60 mg by mouth every morning.   . zaleplon (SONATA) 10 MG capsule Take 20 mg by mouth at bedtime as needed for sleep.    Allergies:   Ephedrine, Aspartame and phenylalanine, Aspirin,  Crestor [rosuvastatin], Erythromycin, Nsaids, and Oxycodone   Social History   Socioeconomic History  . Marital status: Married    Spouse name: alan  . Number of children: 2  . Years of education: college  . Highest education level: Not on file  Occupational History  . Occupation: unemployed  Tobacco Use  . Smoking status: Former Smoker    Packs/day: 0.25    Years: 5.00    Pack years: 1.25    Types: Cigarettes    Quit date: 01/14/1993    Years since quitting: 26.4  . Smokeless tobacco: Never Used  Substance and Sexual Activity  . Alcohol use: No    Alcohol/week: 0.0 standard drinks    Comment: Recovered ETOH  . Drug use: No  . Sexual activity: Not Currently    Partners: Male  Other Topics Concern  . 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 Difficulty of Paying Living Expenses:   Food Insecurity:   . Worried About Charity fundraiser in the Last Year:   . [employer] of  Food in the Last Year:   Transportation Needs:   . Film/video editor (Medical):   Marland Kitchen Lack of Transportation (Non-Medical):   Physical Activity:   . Days of Exercise per Week:   . Minutes of Exercise per Session:   Stress:   . Feeling of Stress :   Social Connections:   . Frequency of Communication with Friends and Family:   . Frequency of Social Gatherings with Friends and Family:   . Attends Religious Services:   . Active Member of Clubs or Organizations:   . Attends Archivist Meetings:   Marland Kitchen Marital Status:      Family History:  The patient's family history includes Alzheimer's disease in her father; Breast cancer in an other family member; Colon cancer in her father; Colon polyps in her father; Coronary artery disease in her father, mother, and another family member; Crohn's disease in her  mother; Dementia in her father; Diabetes in an other family member; Hypertension in her mother; Osteoporosis in her mother; Stroke in her cousin. There is no history of Esophageal cancer, Rectal cancer, or Stomach cancer.  ROS:   Review of Systems  Constitutional: Positive for malaise/fatigue. Negative for chills, diaphoresis, fever and weight loss.  HENT: Negative for congestion.   Eyes: Negative for discharge and redness.  Respiratory: Negative for cough, sputum production, shortness of breath and wheezing.   Cardiovascular: Negative for chest pain, palpitations, orthopnea, claudication, leg swelling and PND.  Gastrointestinal: Negative for abdominal pain, blood in stool, heartburn, melena, nausea and vomiting.  Musculoskeletal: Negative for falls and myalgias.  Skin: Negative for rash.  Neurological: Negative for dizziness, tingling, tremors, sensory change, speech change, focal weakness, loss of consciousness and weakness.  Endo/Heme/Allergies: Does not bruise/bleed easily.  Psychiatric/Behavioral: Negative for substance abuse. The patient is not nervous/anxious.   All other systems reviewed and are negative.    EKGs/Labs/Other Studies Reviewed:    Studies reviewed were summarized above. The additional studies were reviewed today:  Zio patch 01/2016: Normal sinus rhythm Min HR of 56 bpm, max HR of 133 bpm, and avg HR of 76 bpm.   First Degree AV Block was present. Isolated SVEs were rare (<1.0%), SVE Couplets were rare (<1.0%), and no SVE Triplets were present.  Isolated VEs were rare (<1.0%, 766), VE Triplets were rare (<1.0%, 1), and no VE Couplets were present. __________  2D echo 08/2014: - Left ventricle: The cavity size was normal. Systolic function was  normal. The estimated ejection fraction was in the range of 60%  to 65%. Wall motion was normal; there were no regional wall  motion abnormalities. Doppler parameters are consistent with  abnormal left ventricular  relaxation (grade 1 diastolic  dysfunction).  - Left atrium: The atrium was normal in size.  - Right ventricle: Systolic function was normal.  - Pulmonary arteries: Systolic pressure was within the normal  range.   Impressions:   - Normal study. __________  LHC 08/2014:  Acute Mrg lesion, 60% stenosed.  Ost Ramus to Ramus lesion, 99% stenosed.  Prox LAD lesion, 50% stenosed.  1st Mrg lesion, 40% stenosed.   1. Severe one-vessel coronary artery disease. The culprit for myocardial infarction this subtotal occlusion of the ostial ramus. This vessel is medium size and the diameter is about 2 mm. 2. Normal LV systolic function and mildly elevated left ventricular end-diastolic pressure.  Recommendations: The ostial ramus stenosis is in a location where placing a stent might compromise the flow to the LAD or  left circumflex. Doing balloon angioplasty alone would probably not have good long-term results given that the diameter is about 2 mm. Thus, I think the best option is medical therapy for now. I increased the dose of atorvastatin and placed the patient on Brilinta to be taken for one year. Recommend aggressive control of risk factors and blood pressure control.   EKG:  EKG is ordered today.  The EKG ordered today demonstrates NSR, 100 bpm, left axis deviation, first-degree AV block, incomplete RBBB, unchanged from prior  Recent Labs: 04/21/2019: Hemoglobin 15.8; Platelets 329.0 06/03/2019: ALT 36; BUN 9; Creatinine, Ser 0.83; Potassium 4.5; Sodium 140; TSH 3.04  Recent Lipid Panel    Component Value Date/Time   CHOL 298 (H) 06/03/2019 1255   TRIG 362.0 (H) 06/03/2019 1255   HDL 53.20 06/03/2019 1255   CHOLHDL 6 06/03/2019 1255   VLDL 72.4 (H) 06/03/2019 1255   LDLCALC 74 02/05/2019 1512   LDLDIRECT 191.0 06/03/2019 1255    PHYSICAL EXAM:    VS:  BP 140/84 (BP Location: Left Arm, Patient Position: Sitting, Cuff Size: Normal)   Pulse 100   Ht 5' 6.5" (1.689 m)   Wt  200 lb 4 oz (90.8 kg)   LMP 08/23/2006 (Approximate) Comment: tubal ligation  SpO2 98%   BMI 31.84 kg/m   BMI: Body mass index is 31.84 kg/m.  Physical Exam  Constitutional: She is oriented to person, place, and time. She appears well-developed and well-nourished.  HENT:  Head: Normocephalic and atraumatic.  Eyes: Right eye exhibits no discharge. Left eye exhibits no discharge.  Neck: No JVD present.  Cardiovascular: Normal rate, regular rhythm, S1 normal, S2 normal and normal heart sounds. Exam reveals no distant heart sounds, no friction rub, no midsystolic click and no opening snap.  No murmur heard. Pulses:      Posterior tibial pulses are 2+ on the right side and 2+ on the left side.  Pulmonary/Chest: Effort normal and breath sounds normal. No respiratory distress. She has no decreased breath sounds. She has no wheezes. She has no rales. She exhibits no tenderness.  Abdominal: Soft. She exhibits no distension. There is no abdominal tenderness.  Musculoskeletal:        General: No edema.     Cervical back: Normal range of motion.  Neurological: She is alert and oriented to person, place, and time.  Skin: Skin is warm and dry. No cyanosis. Nails show no clubbing.  Psychiatric: She has a normal mood and affect. Her speech is normal and behavior is normal. Judgment and thought content normal.    Wt Readings from Last 3 Encounters:  06/11/19 200 lb 4 oz (90.8 kg)  06/07/19 199 lb 8 oz (90.5 kg)  04/23/19 204 lb 9 oz (92.8 kg)     ASSESSMENT & PLAN:   1. CAD involving the native coronary arteries without angina: She is doing well without any symptoms concerning for angina.  Continue secondary prevention with clopidogrel, not on aspirin secondary to colitis, restart Lipitor.  She has not needed any sublingual nitro since she was last seen.  No plans for further ischemic evaluation at this time.  2. HTN: Blood pressure is reasonably controlled today.  Continue Cardizem CD.  She has  not needed any short acting diltiazem.  3. HLD: Direct LDL of 191 from 05/2019 with goal LDL being less than 70.  Inadvertently, she has not been taking atorvastatin.  Resume Lipitor 80 mg daily.  Continue Zetia 10 mg daily.  Follow-up fasting  lipid panel and liver function in 8 weeks.  Disposition: F/u with Dr. Rockey Situ or an APP in 6 months.   Medication Adjustments/Labs and Tests Ordered: Current medicines are reviewed at length with the patient today.  Concerns regarding medicines are outlined above. Medication changes, Labs and Tests ordered today are summarized above and listed in the Patient Instructions accessible in Encounters.   Signed, Christell Faith, PA-C 06/11/2019 3:54 PM     [address] [place], [place] [address] [phone]

## 2019-06-10 ENCOUNTER — Other Ambulatory Visit: Payer: Self-pay | Admitting: Internal Medicine

## 2019-06-10 ENCOUNTER — Other Ambulatory Visit: Payer: Self-pay

## 2019-06-10 MED ORDER — ATORVASTATIN CALCIUM 80 MG PO TABS: ORAL_TABLET | ORAL | 0 refills | Status: DC

## 2019-06-11 ENCOUNTER — Other Ambulatory Visit: Payer: Self-pay

## 2019-06-11 ENCOUNTER — Ambulatory Visit (INDEPENDENT_AMBULATORY_CARE_PROVIDER_SITE_OTHER): Payer: BC Managed Care – PPO | Admitting: Physician Assistant

## 2019-06-11 ENCOUNTER — Encounter: Payer: Self-pay | Admitting: Physician Assistant

## 2019-06-11 VITALS — BP 140/84 | HR 100 | Ht 66.5 in | Wt 200.2 lb

## 2019-06-11 DIAGNOSIS — C50111 Malignant neoplasm of central portion of right female breast: Secondary | ICD-10-CM | POA: Diagnosis not present

## 2019-06-11 DIAGNOSIS — I2510 Atherosclerotic heart disease of native coronary artery without angina pectoris: Secondary | ICD-10-CM

## 2019-06-11 DIAGNOSIS — I10 Essential (primary) hypertension: Secondary | ICD-10-CM

## 2019-06-11 DIAGNOSIS — E785 Hyperlipidemia, unspecified: Secondary | ICD-10-CM

## 2019-06-11 DIAGNOSIS — Z9013 Acquired absence of bilateral breasts and nipples: Secondary | ICD-10-CM | POA: Diagnosis not present

## 2019-06-11 DIAGNOSIS — C50112 Malignant neoplasm of central portion of left female breast: Secondary | ICD-10-CM | POA: Diagnosis not present

## 2019-06-11 MED ORDER — ATORVASTATIN CALCIUM 80 MG PO TABS: ORAL_TABLET | ORAL | 3 refills | Status: DC

## 2019-06-11 NOTE — Patient Instructions (Signed)
Medication Instructions:  1- Restart Lipitor *If you need a refill on your cardiac medications before your next appointment, please call your pharmacy*   Lab Work: Your physician recommends that you return for lab work in: 8 weeks at the medical mall. You will need to be fasting.  No appt is needed. Hours are M-F 7AM- 6 PM.  If you have labs (blood work) drawn today and your tests are completely normal, you will receive your results only by: Marland Kitchen MyChart Message (if you have MyChart) OR . A paper copy in the mail If you have any lab test that is abnormal or we need to change your treatment, we will call you to review the results.   Testing/Procedures: None ordered    Follow-Up: At [facility], you and your health needs are our priority.  As part of our continuing mission to provide you with exceptional heart care, we have created designated Provider Care Teams.  These Care Teams include your primary Cardiologist (physician) and Advanced Practice Providers (APPs -  Physician Assistants and Nurse Practitioners) who all work together to provide you with the care you need, when you need it.  We recommend signing up for the patient portal called "MyChart".  Sign up information is provided on this After Visit Summary.  MyChart is used to connect with patients for Virtual Visits (Telemedicine).  Patients are able to view lab/test results, encounter notes, upcoming appointments, etc.  Non-urgent messages can be sent to your provider as well.   To learn more about what you can do with MyChart, go to [email or web address].    Your next appointment:   6 month(s)  The format for your next appointment:   In Person  Provider:    You may see Ida Rogue, MD or Christell Faith, PA-C

## 2019-06-22 NOTE — Addendum Note (Signed)
Addended by: Anselm Pancoast on: 06/22/2019 12:52 PM   Modules accepted: Orders

## 2019-06-24 DIAGNOSIS — H40053 Ocular hypertension, bilateral: Secondary | ICD-10-CM | POA: Diagnosis not present

## 2019-06-28 ENCOUNTER — Ambulatory Visit (INDEPENDENT_AMBULATORY_CARE_PROVIDER_SITE_OTHER): Payer: BC Managed Care – PPO | Admitting: Psychology

## 2019-06-28 DIAGNOSIS — F3131 Bipolar disorder, current episode depressed, mild: Secondary | ICD-10-CM | POA: Diagnosis not present

## 2019-06-29 ENCOUNTER — Encounter: Payer: Self-pay | Admitting: Family Medicine

## 2019-07-12 ENCOUNTER — Encounter: Payer: Self-pay | Admitting: Family Medicine

## 2019-07-20 ENCOUNTER — Other Ambulatory Visit: Payer: Self-pay | Admitting: Internal Medicine

## 2019-07-26 ENCOUNTER — Ambulatory Visit (INDEPENDENT_AMBULATORY_CARE_PROVIDER_SITE_OTHER): Payer: BC Managed Care – PPO | Admitting: Psychology

## 2019-07-26 DIAGNOSIS — F3131 Bipolar disorder, current episode depressed, mild: Secondary | ICD-10-CM

## 2019-07-27 ENCOUNTER — Telehealth: Payer: Self-pay | Admitting: Internal Medicine

## 2019-07-27 NOTE — Telephone Encounter (Signed)
Pt is requesting a call back from a nurse regarding her refill on Linzess.

## 2019-07-28 ENCOUNTER — Telehealth: Payer: Self-pay | Admitting: Internal Medicine

## 2019-07-28 MED ORDER — LINACLOTIDE 290 MCG PO CAPS: 290.0000 ug | ORAL_CAPSULE | Freq: Every day | ORAL | 3 refills | Status: DC

## 2019-07-28 NOTE — Telephone Encounter (Signed)
Called patient, no answer, voicemail full.  Patient just needs to schedule an office visit with Dr. Henrene Pastor.  If she does that I can refill her lInzess

## 2019-07-28 NOTE — Telephone Encounter (Signed)
Refilled Linzess

## 2019-07-29 MED ORDER — DILTIAZEM HCL 30 MG PO TABS: ORAL_TABLET | ORAL | 1 refills | Status: DC

## 2019-08-05 ENCOUNTER — Ambulatory Visit: Payer: BC Managed Care – PPO | Admitting: Neurology

## 2019-08-05 ENCOUNTER — Encounter: Payer: Self-pay | Admitting: Neurology

## 2019-08-05 ENCOUNTER — Other Ambulatory Visit: Payer: Self-pay

## 2019-08-05 VITALS — BP 134/85 | HR 93 | Ht 66.5 in | Wt 200.0 lb

## 2019-08-05 DIAGNOSIS — R413 Other amnesia: Secondary | ICD-10-CM | POA: Diagnosis not present

## 2019-08-05 DIAGNOSIS — R42 Dizziness and giddiness: Secondary | ICD-10-CM | POA: Diagnosis not present

## 2019-08-05 DIAGNOSIS — G3184 Mild cognitive impairment, so stated: Secondary | ICD-10-CM | POA: Insufficient documentation

## 2019-08-05 NOTE — Patient Instructions (Addendum)
Memory score was good today  I will order ultrasound of carotid arteries  Document episodes  See you back in 6 months

## 2019-08-05 NOTE — Progress Notes (Signed)
I have read the note, and I agree with the clinical assessment and plan.  Trenise Turay K Geonna Lockyer   

## 2019-08-05 NOTE — Progress Notes (Signed)
PATIENT: Lindsay Fry DOB: [birth year]  REASON FOR VISIT: follow up HISTORY FROM: patient  HISTORY OF PRESENT ILLNESS: Today 08/05/19 Lindsay Fry is a 59 year old female with history of gait and memory disorder.  MRI of the brain was normal, no evidence of stroke event.  Reports about 2 weeks ago, went to lunch with a friend, was standing waiting at salad bar, had to stand for longer than normal time, says she started to feel "weird", dizzy, felt faint, her legs were weak and heavy, felt better after eating, but by the time they left, the feeling returned, stayed for the rest of the day. She didn't fall.  She took her blood pressure at home, was normal.  Did not check blood sugar.  No other episodes reported. Says over all stable, glad to be back doing her water walking. Has trouble with short-term memory.  Has history significant of obesity, fibromyalgia, ulcerative colitis, migraine headache, bipolar disorder, diabetes, hypertension, anxiety, OCD, ADD, and chronic back pain.  Says her diabetes has been fairly well controlled.  Around time of most recent episode, under a lot of stress with family.  Presents today for evaluation unaccompanied.  HISTORY  02/05/2019 Dr. Jannifer Franklin: Lindsay Fry is a 59 year old right-handed white female with a history of central obesity, fibromyalgia, ulcerative colitis, migraine headache, bipolar disorder, diabetes, hypertension, anxiety and OCD behavior, ADD and history of chronic low back pain.  The patient was seen in the emergency room on 16 June 2018 with dizziness that she claimed lasted about 30 seconds and resolved, the patient claims currently she is not having any problems with dizziness.  She states that around 16 November 2018 she had a sudden onset issue with feeling weak in a generalized fashion, she began having problems with word finding and she would sometimes have nonsense sentences when she tried to talk.  Her handwriting was similarly affected.  The  patient had a tendency to lean to the left, she reported a lot of difficulty maintaining balance and was starting to use a walker at nighttime to get to the bathroom to keep from falling.  She claims that more recently she has had some improvement in her ability to walk.  She has had short-term memory issues that have also gotten better but not normalized.  She again reports no dizziness.  She reports no focal numbness or weakness of the face, arms, legs.  She denies any numbness in the feet associated with diabetes.  She denies issues with blacking out or difficulty controlling the bowels or the bladder.  At times her legs may feel somewhat weak.  She denies any significant issues with migraine headaches currently.  She states that her blood sugars have been well controlled, she denies any episodes of hypoglycemia.  She denies any significant changes in medications around the time of onset of symptoms.  She is sent to this office for an evaluation.  REVIEW OF SYSTEMS: Out of a complete 14 system review of symptoms, the patient complains only of the following symptoms, and all other reviewed systems are negative.  Memory loss  ALLERGIES: Allergies  Allergen Reactions  . Ephedrine Other (See Comments)    Pt becomes hyper  . Aspartame And Phenylalanine Nausea Only  . Aspirin     REACTION: aggrivates colitis  . Crestor [Rosuvastatin]     REACTION: increased lfts  . Erythromycin     REACTION: GI upset  . Nsaids     REACTION: aggrivate colitis  . Oxycodone  Other (See Comments)    Panic Attack    HOME MEDICATIONS: Outpatient Medications Prior to Visit  Medication Sig Dispense Refill  . acetaminophen (TYLENOL) 650 MG CR tablet Take 1,300 mg by mouth 2 (two) times daily.    Marland Kitchen amantadine (SYMMETREL) 100 MG capsule Take 100 mg by mouth 2 (two) times daily.    Marland Kitchen atorvastatin (LIPITOR) 80 MG tablet TAKE 1 TABLET(80 MG) BY MOUTH DAILY 90 tablet 3  . B-D UF III MINI PEN NEEDLES 31G X 5 MM MISC USE  FOR INJECTIONS TWICE DAILY 100 each 0  . BAYER MICROLET LANCETS lancets USE AS DIRECTED TO CHECK BLOOD SUGAR TWICE DAILY 200 each 2  . Bioflavonoid Products (VITAMIN C) CHEW Chew 1 tablet by mouth daily.    . Blood Glucose Monitoring Suppl (CONTOUR NEXT MONITOR) w/Device KIT 1 each by Does not apply route in the morning and at bedtime. Use Contour Next meter to check blood sugar twice daily. 1 kit 0  . Calcium Carb-Cholecalciferol (CALCIUM PLUS VITAMIN D3) 600-800 MG-UNIT TABS Take 1 tablet by mouth daily.    . Cholecalciferol (VITAMIN D3) 50 MCG (2000 UT) capsule Take 2,000 Units by mouth 2 (two) times daily.    . clopidogrel (PLAVIX) 75 MG tablet TAKE 1 TABLET(75 MG) BY MOUTH DAILY 90 tablet 2  . co-enzyme Q-10 50 MG capsule Take 50 mg by mouth daily.    . Cyanocobalamin (VITAMIN B-12 PO) 3000 mcg-- 2 gummies daily    . diazepam (VALIUM) 10 MG tablet Take 2.5-10 mg by mouth as needed for anxiety.    Marland Kitchen diltiazem (CARDIZEM CD) 240 MG 24 hr capsule TAKE 1 CAPSULE(240 MG) BY MOUTH DAILY 90 capsule 3  . diltiazem (CARDIZEM) 30 MG tablet Take 1 tablet (30 mg) by mouth up to three times a day as needed for a blood pressure > 150 (top number) 90 tablet 1  . diphenoxylate-atropine (LOMOTIL) 2.5-0.025 MG tablet Take 1-2 tabs daily as needed for diarrhea 50 tablet 0  . docusate sodium (COLACE) 100 MG capsule Take 100 mg by mouth daily.    Marland Kitchen ezetimibe (ZETIA) 10 MG tablet Take 1 tablet (10 mg total) by mouth daily. 90 tablet 3  . famotidine (PEPCID) 40 MG tablet Take 40 mg by mouth 2 (two) times daily.    Marland Kitchen FIBER SELECT GUMMIES PO Take 3 capsules by mouth daily.    Marland Kitchen gabapentin (NEURONTIN) 300 MG capsule TAKE 1 CAPSULE(300 MG) BY MOUTH THREE TIMES DAILY 90 capsule 5  . glucose blood (BAYER CONTOUR NEXT TEST) test strip USE AS DIRECTED TO CHECK BLOOD SUGAR TWICE DAILY 200 each 2  . insulin detemir (LEVEMIR FLEXTOUCH) 100 UNIT/ML FlexPen Inject 51 units under the skin once daily in the evening. 30 mL 2  .  Insulin Pen Needle (B-D UF III MINI PEN NEEDLES) 31G X 5 MM MISC USE FOR INJECTIONS TWICE DAILY 100 each 0  . JARDIANCE 25 MG TABS tablet Take 25 mg by mouth daily.    Marland Kitchen ketoconazole (NIZORAL) 2 % cream Apply to affected areas one to two times daily as needed. 60 g 2  . Lactobacillus-Inulin (CULTURELLE DIGESTIVE DAILY PO) Take 1 capsule by mouth daily.    Marland Kitchen lamoTRIgine (LAMICTAL) 200 MG tablet Take 400 mg by mouth daily.    Marland Kitchen latanoprost (XALATAN) 0.005 % ophthalmic solution Place 1 drop into both eyes 2 (two) times a week.     . letrozole (FEMARA) 2.5 MG tablet TAKE 1 TABLET(2.5 MG) BY MOUTH  DAILY 30 tablet 4  . linaclotide (LINZESS) 290 MCG CAPS capsule Take 1 capsule (290 mcg total) by mouth daily before breakfast. 30 capsule 3  . lithium carbonate (ESKALITH) 450 MG CR tablet Take 900 mg by mouth at bedtime.    . Melatonin 10 MG CAPS Take 1 capsule by mouth at bedtime.    . mesalamine (LIALDA) 1.2 g EC tablet Take 2 tablets (2.4 g total) by mouth daily. Patients need office visit for further refills 60 tablet 2  . Multiple Vitamin (MULTIVITAMIN) tablet Take 1 tablet by mouth daily.      . Omega-3 Krill Oil 500 MG CAPS Take 500 mg by mouth.    . ondansetron (ZOFRAN-ODT) 4 MG disintegrating tablet Take 1 tablet (4 mg total) by mouth every 6 (six) hours as needed for nausea or vomiting. 90 tablet 1  . OZEMPIC, 1 MG/DOSE, 4 MG/3ML SOPN INJECT 1 MG UNDER THE SKIN ONCE A WEEK    . pantoprazole (PROTONIX) 40 MG tablet TAKE 1 TABLET(40 MG) BY MOUTH TWICE DAILY; patient needs office visit for further refills 60 tablet 1  . Polyethylene Glycol 3350 (MIRALAX PO) Take 1 Scoop by mouth daily as needed.    . Probiotic Product (PROBIOTIC DAILY PO) Take by mouth.    . QUEtiapine (SEROQUEL XR) 400 MG 24 hr tablet Take 800 mg by mouth at bedtime.    . Simethicone (GAS-X PO) Take 1 tablet by mouth 4 (four) times daily.    Marland Kitchen SYNTHROID 200 MCG tablet Take 1 tablet by mouth x 6 days and 1.5 tablets by mouth on day  7 34 tablet 2  . triamcinolone (KENALOG) 0.1 % paste Use as directed 1 application in the mouth or throat 2 (two) times daily. To affected areas 5 g 1  . valACYclovir (VALTREX) 1000 MG tablet TAKE 1 TABLET(1000 MG) BY MOUTH DAILY 30 tablet 5  . vitamin C (ASCORBIC ACID) 500 MG tablet Take 250 mg by mouth daily. Takes 2 a day    . VYVANSE 60 MG capsule Take 60 mg by mouth every morning.   0  . zaleplon (SONATA) 10 MG capsule Take 20 mg by mouth at bedtime as needed for sleep.    . nitroGLYCERIN (NITROSTAT) 0.4 MG SL tablet Place 1 tablet (0.4 mg total) under the tongue every 5 (five) minutes as needed for chest pain. 25 tablet 3  . canagliflozin (INVOKANA) 300 MG TABS tablet Take 300 mg by mouth daily before breakfast.    . Semaglutide, 1 MG/DOSE, (OZEMPIC, 1 MG/DOSE,) 2 MG/1.5ML SOPN Inject 1 mg into the skin once a week. (Patient not taking: Reported on 06/11/2019) 3 pen 2   No facility-administered medications prior to visit.    PAST MEDICAL HISTORY: Past Medical History:  Diagnosis Date  . ADD (attention deficit disorder) 11/19/2012  . Allergic rhinitis, cause unspecified   . Anxiety state, unspecified   . Arthritis   . Asymptomatic postmenopausal status (age-related) (natural)   . Atypical hyperplasia of left breast 2000  . Atypical mole 03/16/2018   R paraspinal mid upper back, excision  . Atypical mole 02/17/2018   R spinal upper back, moderate  . Atypical mole 08/11/2017   L sternum, excision  . Atypical mole 05/27/2017   L post neck, moderate  . Benign neoplasm of other and unspecified site of the digestive system   . Bipolar disorder ([place])   . Breast cancer ([place])    Left Breast 41m invasive CA left uiq, dcis left  uoq and a lot ADH, ALH in both breasts.  Marland Kitchen CAD (coronary artery disease)    a. 08/2014 NSTEMI/Cath: LM nl, LAD 50p, D1/D2 min irregs, LCX nl, OM1 40, LPDA nl, RI 99ost (2.30m vessel->med Rx), RCA nl, AM 60, nl EF; b. 11/2016 MV: EF 74%, no ischemia (performed 2/2  dyspnea & new inf TWI).  .Marland [place]Carcinoma of left breast ([place] 06/02/2014  . Cataract   . Chronic diastolic CHF (congestive heart failure) ([place]    a. 08/2014 Echo: EF 60-65%, Gr 1 DD, nl LA.  .Marland [place]Clotting disorder ([place]    on blood thiner, Plavix  . Cyst    on Achilles tendon  . Depression   . Diabetes mellitus ([place]    frank  . Dysuria   . Edema   . Family history of malignant neoplasm of gastrointestinal tract   . Fibromyalgia   . GERD (gastroesophageal reflux disease)   . Glaucoma    pre glaucoma, 05/14/2018 pt. denies glaucoma  . Headache    migraines  . Hiatal hernia   . History of alcoholism ([place]   . History of migraines   . History of ovarian cyst   . Hx of adenomatous colonic polyps   . Insomnia, unspecified   . Myocardial infarction ([place] 09-04-14  . OCD (obsessive compulsive disorder)   . Osteopenia 09/25/2016   Femoral neck T -1.1  9/18  . Other screening mammogram   . Pure hypercholesterolemia   . Rosacea   . Subacute confusional state 11/19/2012  . Tubular adenoma of colon 01/10/11  . Ulcerative colitis, unspecified   . Unspecified asthma(493.90)   . Unspecified essential hypertension   . Unspecified hypothyroidism   . Unspecified vitamin D deficiency   . Vertigo     PAST SURGICAL HISTORY: Past Surgical History:  Procedure Laterality Date  . APPENDECTOMY    . AXILLARY SENTINEL NODE BIOPSY Left 05/23/2014   Procedure: AXILLARY SENTINEL NODE BIOPSY;  Surgeon: SChristene Lye MD;  Location: [facility] ORS;  Service: General;  Laterality: Left;  . BREAST BIOPSY  Aug 2000   on tamoxifen, atypical hyperplasia  . BREAST BIOPSY Left 04/2014  . BREAST SURGERY Left Aug 2000   lumpectomy/ Dr CSharlet Salina . BREAST SURGERY Bilateral 05/23/14   Mastectomy  . BUNIONECTOMY Right   . CARDIAC CATHETERIZATION N/A 09/05/2014   Procedure: Left Heart Cath and Coronary Angiography;  Surgeon: MWellington Hampshire MD;  [facility];  Service: Cardiovascular;  Laterality:  N/A;  . CARDIAC CATHETERIZATION  09-05-14  . CATARACT EXTRACTION W/ INTRAOCULAR LENS IMPLANT Bilateral   . CESAREAN SECTION  1996/1997   placenta previa, gest DM, pre-eclampsia  . CHOLECYSTECTOMY  1997   adhesions also  . COLONOSCOPY  01/2001   Ulcerative colitis  . COLONOSCOPY  04/2004   UC, polyp  . COLONOSCOPY  12/08   UC, no polyps  . DEXA  04/1999 and 2010   normal  . ESOPHAGOGASTRODUODENOSCOPY  09/2001   polyp  . exercise stress test  11/2003   negative  . FEMUR FRACTURE SURGERY Left   . MASTECTOMY Bilateral   . MECKEL DIVERTICULUM EXCISION  1999  . NASAL SINUS SURGERY  05/1997  . nuclear stress test  06/2007   negative  . precancerous mole removed    . SIMPLE MASTECTOMY WITH AXILLARY SENTINEL NODE BIOPSY Bilateral 05/23/2014   Procedure: Bilateral simple mastectomy, left sentinel node biopsy ;  Surgeon: SChristene Lye MD;  Location: [facility] ORS;  Service:  General;  Laterality: Bilateral;  . Sleep study  11/09   no apnea, but did snore (done by [facility])  . TONSILLECTOMY  1984  . TUBAL LIGATION    . WISDOM TOOTH EXTRACTION  1980's    FAMILY HISTORY: Family History  Problem Relation Age of Onset  . Coronary artery disease Father   . Colon cancer Father   . Alzheimer's disease Father   . Dementia Father   . Colon polyps Father   . Coronary artery disease Mother   . Hypertension Mother   . Osteoporosis Mother   . Crohn's disease Mother        crohns colitis  . Breast cancer Other        great aunts  . Coronary artery disease Other        Uncle (also AAA)  . Diabetes Other        remote family history  . Stroke Cousin   . Esophageal cancer Neg Hx   . Rectal cancer Neg Hx   . Stomach cancer Neg Hx     SOCIAL HISTORY: Social History   Socioeconomic History  . Marital status: Married    Spouse name: alan  . Number of children: 2  . Years of education: college  . Highest education level: Not on file  Occupational History  . Occupation: unemployed   Tobacco Use  . Smoking status: Former Smoker    Packs/day: 0.25    Years: 5.00    Pack years: 1.25    Types: Cigarettes    Quit date: 01/14/1993    Years since quitting: 26.5  . Smokeless tobacco: Never Used  Vaping Use  . Vaping Use: Never used  Substance and Sexual Activity  . Alcohol use: No    Alcohol/week: 0.0 standard drinks    Comment: Recovered ETOH  . Drug use: No  . Sexual activity: Not Currently    Partners: Male  Other Topics Concern  . 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 Difficulty of Paying Living Expenses:   Food Insecurity:   . Worried About Charity fundraiser in the Last Year:   . Arboriculturist in the Last Year:   Transportation Needs:   . Film/video editor (Medical):   Marland Kitchen Lack of Transportation (Non-Medical):   Physical Activity:   . Days of Exercise per Week:   . Minutes of Exercise per Session:   Stress:   . Feeling of Stress :   Social Connections:   . Frequency of Communication with Friends and Family:   . Frequency of Social Gatherings with Friends and Family:   . Attends Religious Services:   . Active Member of Clubs or Organizations:   . Attends Archivist Meetings:   Marland Kitchen Marital Status:   Intimate Partner Violence:   . Fear of Current or Ex-Partner:   . Emotionally Abused:   Marland Kitchen Physically Abused:   . Sexually Abused:    PHYSICAL EXAM  Vitals:   08/05/19 1310  BP: (!) 134/85  Pulse: 93  Weight: 200 lb (90.7 kg)  Height: 5' 6.5" (1.689 m)   Body mass index is 31.8 kg/m.  Generalized: Well developed, in  no acute distress  MMSE - Mini Mental State Exam 08/05/2019 02/05/2019  Not completed: - (No Data)  Orientation to time 5 5  Orientation to Place 5 5  Registration 3 3  Attention/ Calculation 5 3  Recall 3 2   Language- name 2 objects 2 2  Language- repeat 1 1  Language- follow 3 step command 3 3  Language- read & follow direction 1 1  Write a sentence 1 1  Copy design 0 1  Total score 29 27    Neurological examination  Mentation: Alert oriented to time, place, history taking. Follows all commands speech and language fluent Cranial nerve II-XII: Pupils were equal round reactive to light. Extraocular movements were full, visual field were full on confrontational test. Facial sensation and strength were normal. Head turning and shoulder shrug  were normal and symmetric. Motor: The motor testing reveals 5 over 5 strength of all 4 extremities. Good symmetric motor tone is noted throughout.  Sensory: Sensory testing is intact to soft touch on all 4 extremities. No evidence of extinction is noted.  Coordination: Cerebellar testing reveals good finger-nose-finger and heel-to-shin bilaterally.  Mild tremor with finger-nose-finger bilaterally Gait and station: Gait is normal. Tandem gait is normal. Romberg is negative. No drift is seen.  Reflexes: Deep tendon reflexes are symmetric but depressed throughout.  DIAGNOSTIC DATA (LABS, IMAGING, TESTING) - I reviewed patient records, labs, notes, testing and imaging myself where available.  Lab Results  Component Value Date   WBC 7.3 04/21/2019   HGB 15.8 (H) 04/21/2019   HCT 47.5 (H) 04/21/2019   MCV 93.3 04/21/2019   PLT 329.0 04/21/2019      Component Value Date/Time   NA 140 06/03/2019 1255   K 4.5 06/03/2019 1255   CL 103 06/03/2019 1255   CO2 30 06/03/2019 1255   GLUCOSE 161 (H) 06/03/2019 1255   BUN 9 06/03/2019 1255   CREATININE 0.83 06/03/2019 1255   CALCIUM 10.5 06/03/2019 1255   PROT 7.0 06/03/2019 1255   ALBUMIN 4.7 06/03/2019 1255   AST 29 06/03/2019 1255   ALT 36 (H) 06/03/2019 1255   ALKPHOS 84 06/03/2019 1255   BILITOT 0.5 06/03/2019 1255   GFRNONAA >60 02/26/2019 1337   GFRAA >60 02/26/2019 1337   Lab Results  Component  Value Date   CHOL 298 (H) 06/03/2019   HDL 53.20 06/03/2019   LDLCALC 74 02/05/2019   LDLDIRECT 191.0 06/03/2019   TRIG 362.0 (H) 06/03/2019   CHOLHDL 6 06/03/2019   Lab Results  Component Value Date   HGBA1C 5.9 06/03/2019   Lab Results  Component Value Date   VITAMINB12 600 12/16/2018   Lab Results  Component Value Date   TSH 3.04 06/03/2019      ASSESSMENT AND PLAN 59 y.o. year old female  has a past medical history of ADD (attention deficit disorder) (11/19/2012), Allergic rhinitis, cause unspecified, Anxiety state, unspecified, Arthritis, Asymptomatic postmenopausal status (age-related) (natural), Atypical hyperplasia of left breast (2000), Atypical mole (03/16/2018), Atypical mole (02/17/2018), Atypical mole (08/11/2017), Atypical mole (05/27/2017), Benign neoplasm of other and unspecified site of the digestive system, Bipolar disorder ([place]), Breast cancer ([place]), CAD (coronary artery disease), Carcinoma of left breast ([place]) (06/02/2014), Cataract, Chronic diastolic CHF (congestive heart failure) ([place]), Clotting disorder ([place]), Cyst, Depression, Diabetes mellitus ([place]), Dysuria, Edema, Family history of malignant neoplasm of gastrointestinal tract, Fibromyalgia, GERD (gastroesophageal reflux disease), Glaucoma, Headache, Hiatal hernia, History of alcoholism ([place]), History of migraines, History of ovarian cyst, adenomatous colonic polyps, Insomnia,  unspecified, Myocardial infarction ([place]) (09-04-14), OCD (obsessive compulsive disorder), Osteopenia (09/25/2016), Other screening mammogram, Pure hypercholesterolemia, Rosacea, Subacute confusional state (11/19/2012), Tubular adenoma of colon (01/10/11), Ulcerative colitis, unspecified, Unspecified asthma(493.90), Unspecified essential hypertension, Unspecified hypothyroidism, Unspecified vitamin D deficiency, and Vertigo. here with:  1.  Memory disturbance 2.  Gait disturbance 3.  Near syncopal episode, most recent 2 weeks ago  MRI of the  brain in February 2021 was normal.  She does have risk factors for cerebrovascular disease, will check carotid ultrasound given recent near syncopal episode.  Etiology of episode is unclear, could be blood sugar, blood pressure, or even medication side effect? (on over 40 medications).  Encouraged to check blood sugar, blood pressure when episodes occur.  Memory is stable, will follow over time.  She will follow-up in 6 months or sooner if needed.  I spent 30 minutes of face-to-face and non-face-to-face time with patient.  This included previsit chart review, lab review, study review, order entry, electronic health record documentation, patient education.    Butler Denmark, AGNP-C, DNP 08/05/2019, 1:19 PM [facility] [address], [place], [place] [id number] [phone]

## 2019-08-10 ENCOUNTER — Encounter: Payer: Self-pay | Admitting: Neurology

## 2019-08-12 ENCOUNTER — Ambulatory Visit (HOSPITAL_COMMUNITY)
Admission: RE | Admit: 2019-08-12 | Discharge: 2019-08-12 | Disposition: A | Discharge status: Home / Self Care | Payer: BC Managed Care – PPO | Source: Ambulatory Visit | Attending: Neurology | Admitting: Neurology

## 2019-08-12 ENCOUNTER — Other Ambulatory Visit: Payer: Self-pay

## 2019-08-12 DIAGNOSIS — R42 Dizziness and giddiness: Secondary | ICD-10-CM | POA: Diagnosis not present

## 2019-08-15 ENCOUNTER — Encounter: Payer: Self-pay | Admitting: Neurology

## 2019-08-15 ENCOUNTER — Other Ambulatory Visit: Payer: Self-pay | Admitting: Internal Medicine

## 2019-08-17 ENCOUNTER — Other Ambulatory Visit: Payer: Self-pay | Admitting: Endocrinology

## 2019-08-19 ENCOUNTER — Telehealth: Payer: Self-pay | Admitting: *Deleted

## 2019-08-19 NOTE — Telephone Encounter (Signed)
Called pt and LVM (ok per DPR) advising no significant stenosis. Advised Dr. Leonie Man also reviewed study. No concerns. I asked for a call back or mychart message if she has questions.  Advised results are available for viewing on mychart.    Suzzanne Cloud, NP  08/17/2019 7:44 AM EDT     US carotid did not show any significant stenosis. Left carotid not mentioned, I contacted Dr. Leonie Man (he read the study), nothing significant on the left.   Summary: Right Carotid: Velocities in the right ICA are consistent with a 1-39% stenosis.  Vertebrals: Bilateral vertebral arteries demonstrate antegrade flow. Subclavians: Normal flow hemodynamics were seen in bilateral subclavian       arteries.

## 2019-08-20 ENCOUNTER — Other Ambulatory Visit: Payer: Self-pay | Admitting: Internal Medicine

## 2019-08-30 ENCOUNTER — Inpatient Hospital Stay: Payer: BC Managed Care – PPO | Attending: Internal Medicine

## 2019-08-30 ENCOUNTER — Inpatient Hospital Stay (HOSPITAL_BASED_OUTPATIENT_CLINIC_OR_DEPARTMENT_OTHER): Payer: BC Managed Care – PPO | Admitting: Internal Medicine

## 2019-08-30 ENCOUNTER — Other Ambulatory Visit: Payer: Self-pay

## 2019-08-30 VITALS — BP 135/67 | HR 69 | Temp 98.7°F | Resp 16 | Ht 66.5 in | Wt 201.0 lb

## 2019-08-30 DIAGNOSIS — R7989 Other specified abnormal findings of blood chemistry: Secondary | ICD-10-CM | POA: Diagnosis not present

## 2019-08-30 DIAGNOSIS — M1990 Unspecified osteoarthritis, unspecified site: Secondary | ICD-10-CM | POA: Insufficient documentation

## 2019-08-30 DIAGNOSIS — M8580 Other specified disorders of bone density and structure, unspecified site: Secondary | ICD-10-CM | POA: Insufficient documentation

## 2019-08-30 DIAGNOSIS — Z79811 Long term (current) use of aromatase inhibitors: Secondary | ICD-10-CM | POA: Insufficient documentation

## 2019-08-30 DIAGNOSIS — Z803 Family history of malignant neoplasm of breast: Secondary | ICD-10-CM | POA: Diagnosis not present

## 2019-08-30 DIAGNOSIS — F329 Major depressive disorder, single episode, unspecified: Secondary | ICD-10-CM | POA: Diagnosis not present

## 2019-08-30 DIAGNOSIS — F419 Anxiety disorder, unspecified: Secondary | ICD-10-CM | POA: Insufficient documentation

## 2019-08-30 DIAGNOSIS — I252 Old myocardial infarction: Secondary | ICD-10-CM | POA: Diagnosis not present

## 2019-08-30 DIAGNOSIS — Z800 Family history of malignant neoplasm of digestive organs: Secondary | ICD-10-CM | POA: Diagnosis not present

## 2019-08-30 DIAGNOSIS — E119 Type 2 diabetes mellitus without complications: Secondary | ICD-10-CM | POA: Insufficient documentation

## 2019-08-30 DIAGNOSIS — I110 Hypertensive heart disease with heart failure: Secondary | ICD-10-CM | POA: Insufficient documentation

## 2019-08-30 DIAGNOSIS — Z9013 Acquired absence of bilateral breasts and nipples: Secondary | ICD-10-CM | POA: Insufficient documentation

## 2019-08-30 DIAGNOSIS — K5190 Ulcerative colitis, unspecified, without complications: Secondary | ICD-10-CM | POA: Insufficient documentation

## 2019-08-30 DIAGNOSIS — Z170 Estrogen receptor positive status [ER+]: Secondary | ICD-10-CM

## 2019-08-30 DIAGNOSIS — D751 Secondary polycythemia: Secondary | ICD-10-CM | POA: Insufficient documentation

## 2019-08-30 DIAGNOSIS — I2510 Atherosclerotic heart disease of native coronary artery without angina pectoris: Secondary | ICD-10-CM | POA: Insufficient documentation

## 2019-08-30 DIAGNOSIS — Z8249 Family history of ischemic heart disease and other diseases of the circulatory system: Secondary | ICD-10-CM | POA: Diagnosis not present

## 2019-08-30 DIAGNOSIS — Z8262 Family history of osteoporosis: Secondary | ICD-10-CM | POA: Insufficient documentation

## 2019-08-30 DIAGNOSIS — C50812 Malignant neoplasm of overlapping sites of left female breast: Secondary | ICD-10-CM

## 2019-08-30 DIAGNOSIS — I5032 Chronic diastolic (congestive) heart failure: Secondary | ICD-10-CM | POA: Insufficient documentation

## 2019-08-30 DIAGNOSIS — Z87891 Personal history of nicotine dependence: Secondary | ICD-10-CM | POA: Insufficient documentation

## 2019-08-30 DIAGNOSIS — Z794 Long term (current) use of insulin: Secondary | ICD-10-CM | POA: Diagnosis not present

## 2019-08-30 DIAGNOSIS — Z79899 Other long term (current) drug therapy: Secondary | ICD-10-CM | POA: Diagnosis not present

## 2019-08-30 DIAGNOSIS — Z833 Family history of diabetes mellitus: Secondary | ICD-10-CM | POA: Insufficient documentation

## 2019-08-30 LAB — CBC WITH DIFFERENTIAL/PLATELET
Abs Immature Granulocytes: 0.04 10*3/uL (ref 0.00–0.07)
Basophils Absolute: 0.1 10*3/uL (ref 0.0–0.1)
Basophils Relative: 1 %
Eosinophils Absolute: 0.2 10*3/uL (ref 0.0–0.5)
Eosinophils Relative: 3 %
HCT: 46.7 % — ABNORMAL HIGH (ref 36.0–46.0)
Hemoglobin: 15.6 g/dL — ABNORMAL HIGH (ref 12.0–15.0)
Immature Granulocytes: 0 %
Lymphocytes Relative: 21 %
Lymphs Abs: 1.8 10*3/uL (ref 0.7–4.0)
MCH: 31.4 pg (ref 26.0–34.0)
MCHC: 33.4 g/dL (ref 30.0–36.0)
MCV: 94 fL (ref 80.0–100.0)
Monocytes Absolute: 0.6 10*3/uL (ref 0.1–1.0)
Monocytes Relative: 7 %
Neutro Abs: 6.1 10*3/uL (ref 1.7–7.7)
Neutrophils Relative %: 68 %
Platelets: 348 10*3/uL (ref 150–400)
RBC: 4.97 MIL/uL (ref 3.87–5.11)
RDW: 13.2 % (ref 11.5–15.5)
WBC: 8.9 10*3/uL (ref 4.0–10.5)
nRBC: 0 % (ref 0.0–0.2)

## 2019-08-30 LAB — COMPREHENSIVE METABOLIC PANEL
ALT: 45 U/L — ABNORMAL HIGH (ref 0–44)
AST: 44 U/L — ABNORMAL HIGH (ref 15–41)
Albumin: 4.8 g/dL (ref 3.5–5.0)
Alkaline Phosphatase: 84 U/L (ref 38–126)
Anion gap: 10 (ref 5–15)
BUN: 12 mg/dL (ref 8–23)
CO2: 24 mmol/L (ref 22–32)
Calcium: 9.4 mg/dL (ref 8.9–10.3)
Chloride: 107 mmol/L (ref 98–111)
Creatinine, Ser: 0.99 mg/dL (ref 0.44–1.00)
GFR calc Af Amer: >60 mL/min (ref >60)
GFR calc non Af Amer: >60 mL/min (ref >60)
Glucose, Bld: 181 mg/dL — ABNORMAL HIGH (ref 70–99)
Potassium: 3.9 mmol/L (ref 3.5–5.1)
Sodium: 141 mmol/L (ref 135–145)
Total Bilirubin: 0.7 mg/dL (ref 0.3–1.2)
Total Protein: 7.7 g/dL (ref 6.5–8.1)

## 2019-08-30 NOTE — Assessment & Plan Note (Addendum)
#   LEFT Breast Stage I s/p mastec [prophy right mastec] on Low risk mammoprint- on Letrozle. STABLE; can stop Letrozole now [5 years].  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  Discussed regarding possible sleep apnea.  Patient had remote sleep study negative.  # Ulcerative colitis-colo-may 2020-STABLE.   # Arthritis- sec to AI- STABLE  # BMD- Sep 2018- osteopenia. Con ca+vit D; clinically stable.  Will repeat BMD  # Mild elevated LFts-off Lipitor;Marland Kitchen  Question fatty liver -overall STABLE  # DISPOSITION: # follow up in 6 monthsMD/labs-cbc/cmp;Jak-2; Erythropoietin level- BMD prior Dr.B

## 2019-08-31 NOTE — Progress Notes (Signed)
[place] OFFICE PROGRESS NOTE  Patient Care Team: Tower, Wynelle Fanny, MD as PCP - General Rockey Situ, Kathlene November, MD as PCP - Cardiology (Cardiology) Elayne Snare, MD as Consulting Physician (Endocrinology) Chucky May, MD as Consulting Physician (Psychiatry) Minna Merritts, MD as Consulting Physician (Cardiology)  Cancer Staging History of breast cancer Staging form: Breast, AJCC 7th Edition - Clinical: Stage IA (T1b, N0, M0) - Unsigned Staging comments: Kidney frequent changes in the 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  No invasive cancer in the right breast.  Status post bilateral mastectomy    Oncology History Overview Note  1.  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  2, April of 2016 patient had  stereotactic biopsy of abnormal breast lesion in the left breastwhich was positive for invasive carcinoma and ductal carcinoma in situ.  Patient underwent bilateral mastectomy  May 9 , 2016  Patient has invasive carcinoma and left breast with significant changes in the right breast ductal carcinoma in situ patient underwent bilateral mastectomy.  Left breast: T1 b N0 M0 stage IB estrogen and progesterone receptor HER-2/neu- Not overexpressed  2.    Multi-gene analysis with  MAMOPRINT low risk for recurrent disease  Patient was started on letrozole and calcium and vitamin D (June, 2016)   # Hx Anxiety/depression [Dr.kaur; [place]]; Hx of colitis [s/p colo- May 2019]; Obesity -------------------------------------------------------------------------------------  # SURVIVORSHIP-P  DIAGNOSIS:LEFT BREAST CA ER-POS  STAGE: I        ;GOALS: curative  CURRENT/MOST RECENT THERAPY: Letrozole-x5 years stopped August 2021.    History of breast cancer  05/23/2014 Initial Diagnosis   Carcinoma of left breast   Carcinoma of overlapping sites of left breast in female, estrogen  receptor positive ([place])      INTERVAL HISTORY:  Lindsay Fry 59 y.o.  female pleasant patient above history of stage I breast cancer ER PR positive HER-2 negative currently on letrozole is here for follow-up.  Patient states she continues to follow-up with psychiatry for her bipolar disorder.  Her ulcerative colitis has not flared up recently.  No blood in stools no black or stools.   Review of Systems  Constitutional: Positive for malaise/fatigue. Negative for chills, diaphoresis, fever and weight loss.  HENT: Negative for nosebleeds and sore throat.   Eyes: Negative for double vision.  Respiratory: Negative for cough, hemoptysis, sputum production, shortness of breath and wheezing.   Cardiovascular: Negative for chest pain, palpitations, orthopnea and leg swelling.  Gastrointestinal: Negative for abdominal pain, blood in stool, constipation, heartburn, melena, nausea and vomiting.  Genitourinary: Negative for dysuria, frequency and urgency.  Musculoskeletal: Positive for back pain and joint pain.  Skin: Negative.  Negative for itching and rash.  Neurological: Negative for dizziness, tingling, focal weakness, weakness and headaches.  Endo/Heme/Allergies: Does not bruise/bleed easily.  Psychiatric/Behavioral: Positive for depression. The patient is nervous/anxious and has insomnia.       PAST MEDICAL HISTORY :  Past Medical History:  Diagnosis Date  . ADD (attention deficit disorder) 11/19/2012  . Allergic rhinitis, cause unspecified   . Anxiety state, unspecified   . Arthritis   . Asymptomatic postmenopausal status (age-related) (natural)   . Atypical hyperplasia of left breast 2000  . Atypical mole 03/16/2018   R paraspinal mid upper back, excision  . Atypical mole 02/17/2018   R spinal upper back, moderate  . Atypical mole 08/11/2017   L  sternum, excision  . Atypical mole 05/27/2017   L post neck, moderate  . Benign neoplasm of other and unspecified site of the  digestive system   . Bipolar disorder ([place])   . Breast cancer ([place])    Left Breast 86m invasive CA left uiq, dcis left uoq and a lot ADH, ALH in both breasts.  .Marland KitchenCAD (coronary artery disease)    a. 08/2014 NSTEMI/Cath: LM nl, LAD 50p, D1/D2 min irregs, LCX nl, OM1 40, LPDA nl, RI 99ost (2.056mvessel->med Rx), RCA nl, AM 60, nl EF; b. 11/2016 MV: EF 74%, no ischemia (performed 2/2 dyspnea & new inf TWI).  . Marland Kitchenarcinoma of left breast ([facility]5/19/2016  . Cataract   . Chronic diastolic CHF (congestive heart failure) ([facility]   a. 08/2014 Echo: EF 60-65%, Gr 1 DD, nl LA.  . Marland Kitchenlotting disorder ([facility]   on blood thiner, Plavix  . Cyst    on Achilles tendon  . Depression   . Diabetes mellitus ([facility]   frank  . Dysuria   . Edema   . Family history of malignant neoplasm of gastrointestinal tract   . Fibromyalgia   . GERD (gastroesophageal reflux disease)   . Glaucoma    pre glaucoma, 05/14/2018 pt. denies glaucoma  . Headache    migraines  . Hiatal hernia   . History of alcoholism ([facility]  . History of migraines   . History of ovarian cyst   . Hx of adenomatous colonic polyps   . Insomnia, unspecified   . Myocardial infarction ([facility]  . OCD (obsessive compulsive disorder)   . Osteopenia 09/25/2016   Femoral neck T -1.1  9/18  . Other screening mammogram   . Pure hypercholesterolemia   . Rosacea   . Subacute confusional state 11/19/2012  . Tubular adenoma of colon 01/10/11  . Ulcerative colitis, unspecified   . Unspecified asthma(493.90)   . Unspecified essential hypertension   . Unspecified hypothyroidism   . Unspecified vitamin D deficiency   . Vertigo     PAST SURGICAL HISTORY :   Past Surgical History:  Procedure Laterality Date  . APPENDECTOMY    . AXILLARY SENTINEL NODE BIOPSY Left 05/23/2014   Procedure: AXILLARY SENTINEL NODE BIOPSY;  Surgeon: SeChristene LyeMD;  Location: [facility] ORS;  Service: General;  Laterality: Left;  . BREAST BIOPSY  Aug 2000   on tamoxifen,  atypical hyperplasia  . BREAST BIOPSY Left 04/2014  . BREAST SURGERY Left Aug 2000   lumpectomy/ Dr CrSharlet [place]. BREAST SURGERY Bilateral 05/23/14   Mastectomy  . BUNIONECTOMY Right   . CARDIAC CATHETERIZATION N/A 09/05/2014   Procedure: Left Heart Cath and Coronary Angiography;  Surgeon: MuWellington HampshireMD;  [facility];  Service: Cardiovascular;  Laterality: N/A;  . CARDIAC CATHETERIZATION  09-05-14  . CATARACT EXTRACTION W/ INTRAOCULAR LENS IMPLANT Bilateral   . CESAREAN SECTION  1996/1997   placenta previa, gest DM, pre-eclampsia  . CHOLECYSTECTOMY  1997   adhesions also  . COLONOSCOPY  01/2001   Ulcerative colitis  . COLONOSCOPY  04/2004   UC, polyp  . COLONOSCOPY  12/08   UC, no polyps  . DEXA  04/1999 and 2010   normal  . ESOPHAGOGASTRODUODENOSCOPY  09/2001   polyp  . exercise stress test  11/2003   negative  . FEMUR FRACTURE SURGERY Left   . MASTECTOMY Bilateral   . MECKEL DIVERTICULUM EXCISION  1999  . NASAL SINUS SURGERY  05/1997  . nuclear stress test  06/2007   negative  . precancerous mole removed    . SIMPLE MASTECTOMY WITH AXILLARY SENTINEL NODE BIOPSY Bilateral 05/23/2014   Procedure: Bilateral simple mastectomy, left sentinel node biopsy ;  Surgeon: Christene Lye, MD;  Location: [facility] ORS;  Service: General;  Laterality: Bilateral;  . Sleep study  11/09   no apnea, but did snore (done by [facility])  . TONSILLECTOMY  1984  . TUBAL LIGATION    . WISDOM TOOTH EXTRACTION  1980's    FAMILY HISTORY :   Family History  Problem Relation Age of Onset  . Coronary artery disease Father   . Colon cancer Father   . Alzheimer's disease Father   . Dementia Father   . Colon polyps Father   . Coronary artery disease Mother   . Hypertension Mother   . Osteoporosis Mother   . Crohn's disease Mother        crohns colitis  . Breast cancer Other        great aunts  . Coronary artery disease Other        Uncle (also AAA)  . Diabetes Other         remote family history  . Stroke Cousin   . Esophageal cancer Neg Hx   . Rectal cancer Neg Hx   . Stomach cancer Neg Hx     SOCIAL HISTORY:   Social History   Tobacco Use  . Smoking status: Former Smoker    Packs/day: 0.25    Years: 5.00    Pack years: 1.25    Types: Cigarettes    Quit date: 01/14/1993    Years since quitting: 26.6  . Smokeless tobacco: Never Used  Vaping Use  . Vaping Use: Never used  Substance Use Topics  . Alcohol use: No    Alcohol/week: 0.0 standard drinks    Comment: Recovered ETOH  . Drug use: No    ALLERGIES:  is allergic to ephedrine, aspartame and phenylalanine, aspirin, crestor [rosuvastatin], erythromycin, nsaids, and oxycodone.  MEDICATIONS:  Current Outpatient Medications  Medication Sig Dispense Refill  . acetaminophen (TYLENOL) 650 MG CR tablet Take 1,300 mg by mouth 2 (two) times daily.    Marland Kitchen amantadine (SYMMETREL) 100 MG capsule Take 100 mg by mouth 2 (two) times daily.    Marland Kitchen atorvastatin (LIPITOR) 80 MG tablet TAKE 1 TABLET(80 MG) BY MOUTH DAILY 90 tablet 3  . B-D UF III MINI PEN NEEDLES 31G X 5 MM MISC USE FOR INJECTIONS TWICE DAILY 100 each 0  . BAYER MICROLET LANCETS lancets USE AS DIRECTED TO CHECK BLOOD SUGAR TWICE DAILY 200 each 2  . Bioflavonoid Products (VITAMIN C) CHEW Chew 1 tablet by mouth daily.    . Blood Glucose Monitoring Suppl (CONTOUR NEXT MONITOR) w/Device KIT 1 each by Does not apply route in the morning and at bedtime. Use Contour Next meter to check blood sugar twice daily. 1 kit 0  . Calcium Carb-Cholecalciferol (CALCIUM PLUS VITAMIN D3) 600-800 MG-UNIT TABS Take 1 tablet by mouth daily.    . Cholecalciferol (VITAMIN D3) 50 MCG (2000 UT) capsule Take 2,000 Units by mouth 2 (two) times daily.    . clopidogrel (PLAVIX) 75 MG tablet TAKE 1 TABLET(75 MG) BY MOUTH DAILY 90 tablet 2  . co-enzyme Q-10 50 MG capsule Take 50 mg by mouth daily.    . Cyanocobalamin (VITAMIN B-12 PO) 3000 mcg-- 2 gummies daily    . diazepam  (VALIUM) 10  MG tablet Take 2.5-10 mg by mouth as needed for anxiety.    Marland Kitchen diltiazem (CARDIZEM CD) 240 MG 24 hr capsule TAKE 1 CAPSULE(240 MG) BY MOUTH DAILY 90 capsule 3  . diltiazem (CARDIZEM) 30 MG tablet Take 1 tablet (30 mg) by mouth up to three times a day as needed for a blood pressure > 150 (top number) 90 tablet 1  . diphenoxylate-atropine (LOMOTIL) 2.5-0.025 MG tablet Take 1-2 tabs daily as needed for diarrhea 50 tablet 0  . docusate sodium (COLACE) 100 MG capsule Take 100 mg by mouth daily.    Marland Kitchen ezetimibe (ZETIA) 10 MG tablet Take 1 tablet (10 mg total) by mouth daily. 90 tablet 3  . famotidine (PEPCID) 40 MG tablet Take 40 mg by mouth 2 (two) times daily.    Marland Kitchen FIBER SELECT GUMMIES PO Take 3 capsules by mouth daily.    Marland Kitchen gabapentin (NEURONTIN) 300 MG capsule TAKE 1 CAPSULE(300 MG) BY MOUTH THREE TIMES DAILY 90 capsule 5  . glucose blood (BAYER CONTOUR NEXT TEST) test strip USE AS DIRECTED TO CHECK BLOOD SUGAR TWICE DAILY 200 each 2  . insulin detemir (LEVEMIR FLEXTOUCH) 100 UNIT/ML FlexPen Inject 51 units under the skin once daily in the evening. 30 mL 2  . Insulin Pen Needle (B-D UF III MINI PEN NEEDLES) 31G X 5 MM MISC USE FOR INJECTIONS TWICE DAILY 100 each 0  . JARDIANCE 25 MG TABS tablet Take 25 mg by mouth daily.    Marland Kitchen ketoconazole (NIZORAL) 2 % cream Apply to affected areas one to two times daily as needed. 60 g 2  . Lactobacillus-Inulin (CULTURELLE DIGESTIVE DAILY PO) Take 1 capsule by mouth daily.    Marland Kitchen lamoTRIgine (LAMICTAL) 200 MG tablet Take 400 mg by mouth daily.    Marland Kitchen latanoprost (XALATAN) 0.005 % ophthalmic solution Place 1 drop into both eyes 2 (two) times a week.     . letrozole (FEMARA) 2.5 MG tablet TAKE 1 TABLET(2.5 MG) BY MOUTH DAILY 30 tablet 4  . linaclotide (LINZESS) 290 MCG CAPS capsule Take 1 capsule (290 mcg total) by mouth daily before breakfast. 30 capsule 3  . lithium carbonate (ESKALITH) 450 MG CR tablet Take 900 mg by mouth at bedtime.    . Melatonin 10 MG  CAPS Take 1 capsule by mouth at bedtime.    . mesalamine (LIALDA) 1.2 g EC tablet Take 2 tablets (2.4 g total) by mouth daily. Patients need office visit for further refills 60 tablet 2  . Multiple Vitamin (MULTIVITAMIN) tablet Take 1 tablet by mouth daily.      . Omega-3 Krill Oil 500 MG CAPS Take 500 mg by mouth.    . ondansetron (ZOFRAN-ODT) 4 MG disintegrating tablet DISSOLVE 1 TABLET(4 MG) ON THE TONGUE EVERY 6 HOURS AS NEEDED FOR NAUSEA OR VOMITING 90 tablet 1  . OZEMPIC, 1 MG/DOSE, 4 MG/3ML SOPN INJECT 1 MG UNDER THE SKIN ONCE A WEEK    . pantoprazole (PROTONIX) 40 MG tablet TAKE 1 TABLET(40 MG) BY MOUTH TWICE DAILY 60 tablet 1  . Polyethylene Glycol 3350 (MIRALAX PO) Take 1 Scoop by mouth daily as needed.    . Probiotic Product (PROBIOTIC DAILY PO) Take by mouth.    . QUEtiapine (SEROQUEL XR) 400 MG 24 hr tablet Take 800 mg by mouth at bedtime.    . Simethicone (GAS-X PO) Take 1 tablet by mouth 4 (four) times daily.    Marland Kitchen SYNTHROID 200 MCG tablet TAKE 1 TABLET BY MOUTH 6 DAYS PER  WEEK AND 1 AND 1/2 TABLETS ON 7TH DAY OF THE WEEK 34 tablet 2  . valACYclovir (VALTREX) 1000 MG tablet TAKE 1 TABLET(1000 MG) BY MOUTH DAILY 30 tablet 5  . vitamin C (ASCORBIC ACID) 500 MG tablet Take 250 mg by mouth daily. Takes 2 a day    . VYVANSE 60 MG capsule Take 60 mg by mouth every morning.   0  . zaleplon (SONATA) 10 MG capsule Take 20 mg by mouth at bedtime as needed for sleep.    . nitroGLYCERIN (NITROSTAT) 0.4 MG SL tablet Place 1 tablet (0.4 mg total) under the tongue every 5 (five) minutes as needed for chest pain. 25 tablet 3   No current facility-administered medications for this visit.    PHYSICAL EXAMINATION: ECOG PERFORMANCE STATUS: 0 - Asymptomatic  BP 135/67 (BP Location: Right Arm, Patient Position: Sitting, Cuff Size: Large)   Pulse 69   Temp 98.7 F (37.1 C) (Tympanic)   Resp 16   Ht 5' 6.5" (1.689 m)   Wt 201 lb (91.2 kg)   LMP 08/23/2006 (Approximate) Comment: tubal ligation   SpO2 95%   BMI 31.96 kg/m   Filed Weights   08/30/19 1446  Weight: 201 lb (91.2 kg)    Physical Exam Constitutional:      Comments: Alone.   HENT:     Head: Normocephalic and atraumatic.     Mouth/Throat:     Pharynx: No oropharyngeal exudate.  Eyes:     Pupils: Pupils are equal, round, and reactive to light.  Cardiovascular:     Rate and Rhythm: Normal rate and regular rhythm.  Pulmonary:     Effort: Pulmonary effort is normal. No respiratory distress.     Breath sounds: Normal breath sounds. No wheezing.  Abdominal:     General: Bowel sounds are normal. There is no distension.     Palpations: Abdomen is soft. There is no mass.     Tenderness: There is no abdominal tenderness. There is no guarding or rebound.  Musculoskeletal:        General: No tenderness. Normal range of motion.     Cervical back: Normal range of motion and neck supple.  Skin:    General: Skin is warm.     Comments: Bilateral mastectomy noted.  No lumps or bumps.  Neurological:     Mental Status: She is alert and oriented to person, place, and time.  Psychiatric:        Mood and Affect: Affect normal.        LABORATORY DATA:  I have reviewed the data as listed    Component Value Date/Time   NA 141 08/30/2019 1437   K 3.9 08/30/2019 1437   CL 107 08/30/2019 1437   CO2 24 08/30/2019 1437   GLUCOSE 181 (H) 08/30/2019 1437   BUN 12 08/30/2019 1437   CREATININE 0.99 08/30/2019 1437   CALCIUM 9.4 08/30/2019 1437   PROT 7.7 08/30/2019 1437   ALBUMIN 4.8 08/30/2019 1437   AST 44 (H) 08/30/2019 1437   ALT 45 (H) 08/30/2019 1437   ALKPHOS 84 08/30/2019 1437   BILITOT 0.7 08/30/2019 1437   GFRNONAA >60 08/30/2019 1437   GFRAA >60 08/30/2019 1437    No results found for: SPEP, UPEP  Lab Results  Component Value Date   WBC 8.9 08/30/2019   NEUTROABS 6.1 08/30/2019   HGB 15.6 (H) 08/30/2019   HCT 46.7 (H) 08/30/2019   MCV 94.0 08/30/2019   PLT 348 08/30/2019  Chemistry       Component Value Date/Time   NA 141 08/30/2019 1437   K 3.9 08/30/2019 1437   CL 107 08/30/2019 1437   CO2 24 08/30/2019 1437   BUN 12 08/30/2019 1437   CREATININE 0.99 08/30/2019 1437      Component Value Date/Time   CALCIUM 9.4 08/30/2019 1437   ALKPHOS 84 08/30/2019 1437   AST 44 (H) 08/30/2019 1437   ALT 45 (H) 08/30/2019 1437   BILITOT 0.7 08/30/2019 1437       RADIOGRAPHIC STUDIES: I have personally reviewed the radiological images as listed and agreed with the findings in the report. No results found.   ASSESSMENT & PLAN:  Carcinoma of overlapping sites of left breast in female, estrogen receptor positive ([place]) # LEFT Breast Stage I s/p mastec [prophy right mastec] on Low risk mammoprint- on Letrozle. STABLE; can stop Letrozole now [5 years].  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  Discussed regarding possible sleep apnea.  Patient had remote sleep study negative.  # Ulcerative colitis-colo-may 2020-STABLE.   # Arthritis- sec to AI- STABLE  # BMD- Sep 2018- osteopenia. Con ca+vit D; clinically stable.  Will repeat BMD  # Mild elevated LFts-off Lipitor;Marland Kitchen  Question fatty liver -overall STABLE  # DISPOSITION: # follow up in 6 monthsMD/labs-cbc/cmp;Jak-2; Erythropoietin level- BMD prior Dr.B     Orders Placed This Encounter  Procedures  . DG BONE DENSITY (DXA)    Standing Status:   Future    Standing Expiration Date:   08/29/2020    Order Specific Question:   Reason for Exam (SYMPTOM  OR DIAGNOSIS REQUIRED)    Answer:   history of breast cancer, aromatase inhibitor use    Order Specific Question:   Preferred imaging location?    Answer:   [facility]  . CBC with Differential/Platelet    Standing Status:   Future    Standing Expiration Date:   08/29/2020  . Comprehensive metabolic panel    Standing Status:   Future    Standing Expiration Date:   08/29/2020  . Erythropoietin    Standing Status:   Future     Standing Expiration Date:   08/29/2020  . JAK2 genotypr    Standing Status:   Future    Standing Expiration Date:   08/29/2020  . CBC with Differential/Platelet    Standing Status:   Future    Standing Expiration Date:   08/29/2020  . Comprehensive metabolic panel    Standing Status:   Future    Standing Expiration Date:   08/29/2020   All questions were answered. The patient knows to call the clinic with any problems, questions or concerns.      Cammie Sickle, MD 08/31/2019 9:02 AM

## 2019-09-06 ENCOUNTER — Other Ambulatory Visit (INDEPENDENT_AMBULATORY_CARE_PROVIDER_SITE_OTHER): Payer: BC Managed Care – PPO

## 2019-09-06 ENCOUNTER — Other Ambulatory Visit: Payer: Self-pay

## 2019-09-06 ENCOUNTER — Ambulatory Visit (INDEPENDENT_AMBULATORY_CARE_PROVIDER_SITE_OTHER): Payer: BC Managed Care – PPO | Admitting: Psychology

## 2019-09-06 ENCOUNTER — Other Ambulatory Visit: Payer: Self-pay | Admitting: Internal Medicine

## 2019-09-06 DIAGNOSIS — F3131 Bipolar disorder, current episode depressed, mild: Secondary | ICD-10-CM | POA: Diagnosis not present

## 2019-09-06 DIAGNOSIS — E890 Postprocedural hypothyroidism: Secondary | ICD-10-CM | POA: Diagnosis not present

## 2019-09-06 DIAGNOSIS — E1169 Type 2 diabetes mellitus with other specified complication: Secondary | ICD-10-CM

## 2019-09-06 DIAGNOSIS — E669 Obesity, unspecified: Secondary | ICD-10-CM

## 2019-09-06 DIAGNOSIS — E782 Mixed hyperlipidemia: Secondary | ICD-10-CM

## 2019-09-06 LAB — LIPID PANEL
Cholesterol: 154 mg/dL (ref 0–200)
HDL: 41.6 mg/dL (ref >39.00)
NonHDL: 112.83
Total CHOL/HDL Ratio: 4
Triglycerides: 209 mg/dL — ABNORMAL HIGH (ref 0.0–149.0)
VLDL: 41.8 mg/dL — ABNORMAL HIGH (ref 0.0–40.0)

## 2019-09-06 LAB — T4, FREE: Free T4: 0.68 ng/dL (ref 0.60–1.60)

## 2019-09-06 LAB — MICROALBUMIN / CREATININE URINE RATIO
Creatinine,U: 30.5 mg/dL
Microalb Creat Ratio: 2.3 mg/g (ref 0.0–30.0)
Microalb, Ur: <0.7 mg/dL (ref 0.0–1.9)

## 2019-09-06 LAB — COMPREHENSIVE METABOLIC PANEL
ALT: 43 U/L — ABNORMAL HIGH (ref 0–35)
AST: 36 U/L (ref 0–37)
Albumin: 4.3 g/dL (ref 3.5–5.2)
Alkaline Phosphatase: 81 U/L (ref 39–117)
BUN: 12 mg/dL (ref 6–23)
CO2: 29 mEq/L (ref 19–32)
Calcium: 10.4 mg/dL (ref 8.4–10.5)
Chloride: 104 mEq/L (ref 96–112)
Creatinine, Ser: 0.94 mg/dL (ref 0.40–1.20)
GFR: 60.51 mL/min (ref >60.00)
Glucose, Bld: 138 mg/dL — ABNORMAL HIGH (ref 70–99)
Potassium: 4.1 mEq/L (ref 3.5–5.1)
Sodium: 141 mEq/L (ref 135–145)
Total Bilirubin: 0.6 mg/dL (ref 0.2–1.2)
Total Protein: 6.4 g/dL (ref 6.0–8.3)

## 2019-09-06 LAB — TSH: TSH: 6.58 u[IU]/mL — ABNORMAL HIGH (ref 0.35–4.50)

## 2019-09-06 LAB — LDL CHOLESTEROL, DIRECT: Direct LDL: 86 mg/dL

## 2019-09-06 LAB — HEMOGLOBIN A1C: Hgb A1c MFr Bld: 6.4 % (ref 4.6–6.5)

## 2019-09-08 ENCOUNTER — Ambulatory Visit: Payer: BC Managed Care – PPO | Admitting: Endocrinology

## 2019-09-08 ENCOUNTER — Other Ambulatory Visit: Payer: Self-pay

## 2019-09-08 ENCOUNTER — Encounter: Payer: Self-pay | Admitting: Endocrinology

## 2019-09-08 VITALS — BP 136/70 | HR 110 | Ht 66.5 in | Wt 202.4 lb

## 2019-09-08 DIAGNOSIS — E782 Mixed hyperlipidemia: Secondary | ICD-10-CM

## 2019-09-08 DIAGNOSIS — E890 Postprocedural hypothyroidism: Secondary | ICD-10-CM

## 2019-09-08 DIAGNOSIS — E1169 Type 2 diabetes mellitus with other specified complication: Secondary | ICD-10-CM | POA: Diagnosis not present

## 2019-09-08 DIAGNOSIS — R945 Abnormal results of liver function studies: Secondary | ICD-10-CM | POA: Diagnosis not present

## 2019-09-08 DIAGNOSIS — E669 Obesity, unspecified: Secondary | ICD-10-CM

## 2019-09-08 NOTE — Patient Instructions (Signed)
Extra 1/2 thyroid pill 1x per week  Check blood sugars on waking up 2-4 days a week  Also check blood sugars about 2 hours after meals and do this after different meals by rotation  Recommended blood sugar levels on waking up are 90-130 and about 2 hours after meal is 130-160  Please bring your blood sugar monitor to each visit, thank you

## 2019-09-08 NOTE — Progress Notes (Signed)
Patient ID: Lindsay Fry, female   DOB: [birth year], 59 y.o.   MRN: [id number]   Reason for Appointment:   followup visit  History of Present Illness:  DIABETES:  Prior history: With her high A1c of 6.7 in 03/2014 she had an abnormal glucose tolerance test indicating diabetes and 1 hour glucose of 300 She was started on metformin; however even with 1500 mg of metformin ER  blood sugars were not controlled especially fasting Subsequently with taking Janumet XR 100/1000 daily her blood sugars were much better Because of her weight gain and A1c going up to 7.3 along with occasional readings over 200 she was started on Victoza in 11/16 She has had progressive hyperglycemia previously and A1c was up to 8.6% in 4/17  She was started on Levemir insulin in 08/2015 because of progressive rise in blood sugars and inability to control with 3 other agents   Non-insulin hypoglycemic drugs: Ozempic 1 mg weekly; metformin ER 2000 mg daily, Jardiance 25 mg daily  INSULIN regimen: Levemir 51 units at bedtime  A1c is 6.4  Current management, blood sugar patterns and problems identified:  She has not been on Jardiance since 6/21 since her insurance would not cover [employer]  She says she lost her meter and has only started checking blood sugars sporadically a couple of weeks ago  She had a high reading of 228 after lunch when she had a high fat meal with [place] fries and drinking Sprite  Still not always cutting back on regular soft and  She has had intermittent exercise regiment and only occasionally doing water exercises, not doing any other exercise because of hot weather  Blood sugars in the mornings are reasonably good and was 138 in the lab fasting  Weight is about the same   GLUCOSE readings from review of the CONTOUR monitor download and averages for 30 days:  Recent blood sugar fasting range 114-228 with only 5 readings and only one high reading  Weight history:  Wt  Readings from Last 3 Encounters:  09/08/19 202 lb 6.4 oz (91.8 kg)  08/30/19 201 lb (91.2 kg)  08/05/19 200 lb (90.7 kg)   Previous consultation with dietitian: 2016    LABS:  Lab Results  Component Value Date   HGBA1C 6.4 09/06/2019   HGBA1C 5.9 06/03/2019   HGBA1C 6.2 (A) 02/05/2019   Lab Results  Component Value Date   MICROALBUR <0.7 09/06/2019   [place] 74 02/05/2019   CREATININE 0.94 09/06/2019   HYPOTHYROIDISM: This was first diagnosed in 1992 after her treatment for Graves' disease with I-131 She has been on relatively large doses of thyroxine supplements She was on 224 mcg since 11/13 and her TSH was normal in 3/14 Subjectively difficult to assess her thyroid since she tends to have fatigue chronically.  In 2014 her dose was reduced to 200 mcg daily and her dose has been fluctuating since then She had required periodic increase in dosage including in 05/2014 when her TSH was 20  She is  on 200 g dosage, on the brand name Synthroid She was told to take an extra half a pill once a week but she forgets to do this  Sometimes will feel more fatigued Her weight has fluctuated but no consistent increase  She has been consistently regular with the Synthroid every day before breakfast on empty stomach         TSH is now back up to 6.6  Lab Results  Component Value Date  TSH 6.58 (H) 09/06/2019   TSH 3.04 06/03/2019   TSH 3.53 04/21/2019   FREET4 0.68 09/06/2019   FREET4 0.73 06/03/2019   FREET4 0.85 02/02/2019     Lab on 09/06/2019  Component Date Value Ref Range Status  . Cholesterol 09/06/2019 154  0 - 200 mg/dL Final   ATP III Classification       Desirable:  < 200 mg/dL               Borderline High:  200 - 239 mg/dL          High:  > = 240 mg/dL  . Triglycerides 09/06/2019 209.0* 0 - 149 mg/dL Final   Normal:  <150 mg/dLBorderline High:  150 - 199 mg/dL  . HDL 09/06/2019 41.60  >39.00 mg/dL Final  . VLDL 09/06/2019 41.8* 0.0 - 40.0 mg/dL Final  . Total  CHOL/HDL Ratio 09/06/2019 4   Final                  Men          Women1/2 Average Risk     3.4          3.3Average Risk          5.0          4.42X Average Risk          9.6          7.13X Average Risk          15.0          11.0                      . NonHDL 09/06/2019 112.83   Final   NOTE:  Non-HDL goal should be 30 mg/dL higher than patient's LDL goal (i.e. LDL goal of < 70 mg/dL, would have non-HDL goal of < 100 mg/dL)  . Free T4 09/06/2019 0.68  0.60 - 1.60 ng/dL Final   Comment: Specimens from patients who are undergoing biotin therapy and /or ingesting biotin supplements may contain high levels of biotin.  The higher biotin concentration in these specimens interferes with this Free T4 assay.  Specimens that contain high levels  of biotin may cause false high results for this Free T4 assay.  Please interpret results in light of the total clinical presentation of the patient.    Marland Kitchen TSH 09/06/2019 6.58* 0.35 - 4.50 uIU/mL Final  . Microalb, Ur 09/06/2019 <0.7  0.0 - 1.9 mg/dL Final  . Creatinine,U 09/06/2019 30.5  mg/dL Final  . Microalb Creat Ratio 09/06/2019 2.3  0.0 - 30.0 mg/g Final  . Sodium 09/06/2019 141  135 - 145 mEq/L Final  . Potassium 09/06/2019 4.1  3.5 - 5.1 mEq/L Final  . Chloride 09/06/2019 104  96 - 112 mEq/L Final  . CO2 09/06/2019 29  19 - 32 mEq/L Final  . Glucose, Bld 09/06/2019 138* 70 - 99 mg/dL Final  . BUN 09/06/2019 12  6 - 23 mg/dL Final  . Creatinine, Ser 09/06/2019 0.94  0.40 - 1.20 mg/dL Final  . Total Bilirubin 09/06/2019 0.6  0.2 - 1.2 mg/dL Final  . Alkaline Phosphatase 09/06/2019 81  39 - 117 U/L Final  . AST 09/06/2019 36  0 - 37 U/L Final  . ALT 09/06/2019 43* 0 - 35 U/L Final  . Total Protein 09/06/2019 6.4  6.0 - 8.3 g/dL Final  . Albumin 09/06/2019 4.3  3.5 - 5.2 g/dL Final  .  GFR 09/06/2019 60.51  >60.00 mL/min Final  . Calcium 09/06/2019 10.4  8.4 - 10.5 mg/dL Final  . Hgb A1c MFr Bld 09/06/2019 6.4  4.6 - 6.5 % Final   Glycemic Control  Guidelines for People with Diabetes:Non Diabetic:  <6%Goal of Therapy: <7%Additional Action Suggested:  >8%   . Direct LDL 09/06/2019 86.0  mg/dL Final   Optimal:  <100 mg/dLNear or Above Optimal:  100-129 mg/dLBorderline High:  130-159 mg/dLHigh:  160-189 mg/dLVery High:  >190 mg/dL    Allergies as of 09/08/2019      Reactions   Ephedrine Other (See Comments)   Pt becomes hyper   Aspartame And Phenylalanine Nausea Only   Aspirin    REACTION: aggrivates colitis   Crestor [rosuvastatin]    REACTION: increased lfts   Erythromycin    REACTION: GI upset   Nsaids    REACTION: aggrivate colitis   Oxycodone Other (See Comments)   Panic Attack      Medication List       Accurate as of September 08, 2019  1:23 PM. If you have any questions, ask your nurse or doctor.        acetaminophen 650 MG CR tablet Commonly known as: TYLENOL Take 1,300 mg by mouth 2 (two) times daily.   amantadine 100 MG capsule Commonly known as: SYMMETREL Take 100 mg by mouth 2 (two) times daily.   atorvastatin 80 MG tablet Commonly known as: LIPITOR TAKE 1 TABLET(80 MG) BY MOUTH DAILY   Bayer Microlet Lancets lancets USE AS DIRECTED TO CHECK BLOOD SUGAR TWICE DAILY   Calcium Plus Vitamin D3 600-800 MG-UNIT Tabs Generic drug: Calcium Carb-Cholecalciferol Take 1 tablet by mouth daily.   clopidogrel 75 MG tablet Commonly known as: PLAVIX TAKE 1 TABLET(75 MG) BY MOUTH DAILY   co-enzyme Q-10 50 MG capsule Take 50 mg by mouth daily.   Contour Next Monitor w/Device Kit 1 each by Does not apply route in the morning and at bedtime. Use Contour Next meter to check blood sugar twice daily.   CULTURELLE DIGESTIVE DAILY PO Take 1 capsule by mouth daily.   diazepam 10 MG tablet Commonly known as: VALIUM Take 2.5-10 mg by mouth as needed for anxiety.   diltiazem 240 MG 24 hr capsule Commonly known as: CARDIZEM CD TAKE 1 CAPSULE(240 MG) BY MOUTH DAILY   diltiazem 30 MG tablet Commonly known as:  CARDIZEM Take 1 tablet (30 mg) by mouth up to three times a day as needed for a blood pressure > 150 (top number)   diphenoxylate-atropine 2.5-0.025 MG tablet Commonly known as: Lomotil Take 1-2 tabs daily as needed for diarrhea   docusate sodium 100 MG capsule Commonly known as: COLACE Take 100 mg by mouth daily.   ezetimibe 10 MG tablet Commonly known as: Zetia Take 1 tablet (10 mg total) by mouth daily.   famotidine 40 MG tablet Commonly known as: PEPCID Take 40 mg by mouth 2 (two) times daily.   FIBER SELECT GUMMIES PO Take 3 capsules by mouth daily.   gabapentin 300 MG capsule Commonly known as: NEURONTIN TAKE 1 CAPSULE(300 MG) BY MOUTH THREE TIMES DAILY   GAS-X PO Take 1 tablet by mouth 4 (four) times daily.   glucose blood test strip Commonly known as: Visual merchandiser Next Test USE AS DIRECTED TO CHECK BLOOD SUGAR TWICE DAILY   Insulin Pen Needle 31G X 5 MM Misc Commonly known as: B-D UF III MINI PEN NEEDLES USE FOR INJECTIONS TWICE DAILY  B-D UF III MINI PEN NEEDLES 31G X 5 MM Misc Generic drug: Insulin Pen Needle USE FOR INJECTIONS TWICE DAILY   Jardiance 25 MG Tabs tablet Generic drug: empagliflozin Take 25 mg by mouth daily.   ketoconazole 2 % cream Commonly known as: NIZORAL Apply to affected areas one to two times daily as needed.   lamoTRIgine 200 MG tablet Commonly known as: LAMICTAL Take 400 mg by mouth daily.   latanoprost 0.005 % ophthalmic solution Commonly known as: XALATAN Place 1 drop into both eyes 2 (two) times a week.   letrozole 2.5 MG tablet Commonly known as: FEMARA TAKE 1 TABLET(2.5 MG) BY MOUTH DAILY   Levemir FlexTouch 100 UNIT/ML FlexPen Generic drug: insulin detemir Inject 51 units under the skin once daily in the evening.   linaclotide 290 MCG Caps capsule Commonly known as: LINZESS Take 1 capsule (290 mcg total) by mouth daily before breakfast.   lithium carbonate 450 MG CR tablet Commonly known as: ESKALITH Take  900 mg by mouth at bedtime.   Melatonin 10 MG Caps Take 1 capsule by mouth at bedtime.   mesalamine 1.2 g EC tablet Commonly known as: LIALDA TAKE 2 TABLETS BY MOUTH DAILY   MIRALAX PO Take 1 Scoop by mouth daily as needed.   multivitamin tablet Take 1 tablet by mouth daily.   nitroGLYCERIN 0.4 MG SL tablet Commonly known as: NITROSTAT Place 1 tablet (0.4 mg total) under the tongue every 5 (five) minutes as needed for chest pain.   Omega-3 Krill Oil 500 MG Caps Take 500 mg by mouth.   ondansetron 4 MG disintegrating tablet Commonly known as: ZOFRAN-ODT DISSOLVE 1 TABLET(4 MG) ON THE TONGUE EVERY 6 HOURS AS NEEDED FOR NAUSEA OR VOMITING   Ozempic (1 MG/DOSE) 4 MG/3ML Sopn Generic drug: Semaglutide (1 MG/DOSE) INJECT 1 MG UNDER THE SKIN ONCE A WEEK   pantoprazole 40 MG tablet Commonly known as: PROTONIX TAKE 1 TABLET(40 MG) BY MOUTH TWICE DAILY   PROBIOTIC DAILY PO Take by mouth.   QUEtiapine 400 MG 24 hr tablet Commonly known as: SEROQUEL XR Take 800 mg by mouth at bedtime.   Synthroid 200 MCG tablet Generic drug: levothyroxine TAKE 1 TABLET BY MOUTH 6 DAYS PER WEEK AND 1 AND 1/2 TABLETS ON 7TH DAY OF THE WEEK   valACYclovir 1000 MG tablet Commonly known as: VALTREX TAKE 1 TABLET(1000 MG) BY MOUTH DAILY   VITAMIN B-12 PO 3000 mcg-- 2 gummies daily   vitamin C 500 MG tablet Commonly known as: ASCORBIC ACID Take 250 mg by mouth daily. Takes 2 a day   Vitamin C Chew Chew 1 tablet by mouth daily.   Vitamin D3 50 MCG (2000 UT) capsule Take 2,000 Units by mouth 2 (two) times daily.   Vyvanse 60 MG capsule Generic drug: lisdexamfetamine Take 60 mg by mouth every morning.   zaleplon 10 MG capsule Commonly known as: SONATA Take 20 mg by mouth at bedtime as needed for sleep.       Allergies:  Allergies  Allergen Reactions  . Ephedrine Other (See Comments)    Pt becomes hyper  . Aspartame And Phenylalanine Nausea Only  . Aspirin     REACTION:  aggrivates colitis  . Crestor [Rosuvastatin]     REACTION: increased lfts  . Erythromycin     REACTION: GI upset  . Nsaids     REACTION: aggrivate colitis  . Oxycodone Other (See Comments)    Panic Attack    Past Medical History:  Diagnosis Date  . ADD (attention deficit disorder) 11/19/2012  . Allergic rhinitis, cause unspecified   . Anxiety state, unspecified   . Arthritis   . Asymptomatic postmenopausal status (age-related) (natural)   . Atypical hyperplasia of left breast 2000  . Atypical mole 03/16/2018   R paraspinal mid upper back, excision  . Atypical mole 02/17/2018   R spinal upper back, moderate  . Atypical mole 08/11/2017   L sternum, excision  . Atypical mole 05/27/2017   L post neck, moderate  . Benign neoplasm of other and unspecified site of the digestive system   . Bipolar disorder ([place])   . Breast cancer ([place])    Left Breast 68m invasive CA left uiq, dcis left uoq and a lot ADH, ALH in both breasts.  .Marland KitchenCAD (coronary artery disease)    a. 08/2014 NSTEMI/Cath: LM nl, LAD 50p, D1/D2 min irregs, LCX nl, OM1 40, LPDA nl, RI 99ost (2.020mvessel->med Rx), RCA nl, AM 60, nl EF; b. 11/2016 MV: EF 74%, no ischemia (performed 2/2 dyspnea & new inf TWI).  . Marland Kitchenarcinoma of left breast ([facility]5/19/2016  . Cataract   . Chronic diastolic CHF (congestive heart failure) ([facility]   a. 08/2014 Echo: EF 60-65%, Gr 1 DD, nl LA.  . Marland Kitchenlotting disorder (HCOsage   on blood thiner, Plavix  . Cyst    on Achilles tendon  . Depression   . Diabetes mellitus ([facility] [place]   frank  . Dysuria   . Edema   . Family history of malignant neoplasm of gastrointestinal tract   . Fibromyalgia   . GERD (gastroesophageal reflux disease)   . Glaucoma    pre glaucoma, 05/14/2018 pt. denies glaucoma  . Headache    migraines  . Hiatal hernia   . History of alcoholism (HCDec[place]  . History of migraines   . History of ovarian cyst   . Hx of adenomatous colonic polyps   . Insomnia, unspecified   . Myocardial  infarction ([facility]8-21-16  . OCD (obsessive compulsive disorder)   . Osteopenia 09/25/2016   Femoral neck T -1.1  9/18  . Other screening mammogram   . Pure hypercholesterolemia   . Rosacea   . Subacute confusional state 11/19/2012  . Tubular adenoma of colon 01/10/11  . Ulcerative colitis, unspecified   . Unspecified asthma(493.90)   . Unspecified essential hypertension   . Unspecified hypothyroidism   . Unspecified vitamin D deficiency   . Vertigo     Past Surgical History:  Procedure Laterality Date  . APPENDECTOMY    . AXILLARY SENTINEL NODE BIOPSY Left 05/23/2014   Procedure: AXILLARY SENTINEL NODE BIOPSY;  Surgeon: SeChristene LyeMD;  Location: [facility] ORS;  Service: General;  Laterality: Left;  . BREAST BIOPSY  Aug 2000   on tamoxifen, atypical hyperplasia  . BREAST BIOPSY Left 04/2014  . BREAST SURGERY Left Aug 2000   lumpectomy/ Dr CrSharlet [place]. BREAST SURGERY Bilateral 05/23/14   Mastectomy  . BUNIONECTOMY Right   . CARDIAC CATHETERIZATION N/A 09/05/2014   Procedure: Left Heart Cath and Coronary Angiography;  Surgeon: MuWellington HampshireMD;  [facility];  Service: Cardiovascular;  Laterality: N/A;  . CARDIAC CATHETERIZATION  09-05-14  . CATARACT EXTRACTION W/ INTRAOCULAR LENS IMPLANT Bilateral   . CESAREAN SECTION  1996/1997   placenta previa, gest DM, pre-eclampsia  . CHOLECYSTECTOMY  1997   adhesions also  . COLONOSCOPY  01/2001   Ulcerative colitis  . COLONOSCOPY  04/2004   UC, polyp  . COLONOSCOPY  12/08   UC, no polyps  . DEXA  04/1999 and 2010   normal  . ESOPHAGOGASTRODUODENOSCOPY  09/2001   polyp  . exercise stress test  11/2003   negative  . FEMUR FRACTURE SURGERY Left   . MASTECTOMY Bilateral   . MECKEL DIVERTICULUM EXCISION  1999  . NASAL SINUS SURGERY  05/1997  . nuclear stress test  06/2007   negative  . precancerous mole removed    . SIMPLE MASTECTOMY WITH AXILLARY SENTINEL NODE BIOPSY Bilateral 05/23/2014   Procedure: Bilateral  simple mastectomy, left sentinel node biopsy ;  Surgeon: Christene Lye, MD;  Location: [facility] ORS;  Service: General;  Laterality: Bilateral;  . Sleep study  11/09   no apnea, but did snore (done by [facility])  . TONSILLECTOMY  1984  . TUBAL LIGATION    . WISDOM TOOTH EXTRACTION  1980's    Family History  Problem Relation Age of Onset  . Coronary artery disease Father   . Colon cancer Father   . Alzheimer's disease Father   . Dementia Father   . Colon polyps Father   . Coronary artery disease Mother   . Hypertension Mother   . Osteoporosis Mother   . Crohn's disease Mother        crohns colitis  . Breast cancer Other        great aunts  . Coronary artery disease Other        Uncle (also AAA)  . Diabetes Other        remote family history  . Stroke Cousin   . Esophageal cancer Neg Hx   . Rectal cancer Neg Hx   . Stomach cancer Neg Hx     Social History:  reports that she quit smoking about 26 years ago. Her smoking use included cigarettes. She has a 1.25 pack-year smoking history. She has never used smokeless tobacco. She reports that she does not drink alcohol and does not use drugs.  REVIEW Of SYSTEMS:   HYPERCALCEMIA: Her calcium has been previously high PTH in the past has been borderline for hyperparathyroidism and is last below 20  She is taking OTC vitamin D3, ? 2000 units and has been on calcium recommended by her oncologist Vitamin D level therapeutic  Bone density in 9/15 shows T score -1.1 at the hip  Lab Results  Component Value Date   PTH 18 10/25/2016   CALCIUM 10.4 09/06/2019   PHOS 2.3 01/29/2007     Lab Results  Component Value Date   CALCIUM 10.4 09/06/2019   PHOS 2.3 01/29/2007   Lab Results  Component Value Date   VD25OH 41.98 06/03/2019   VD25OH 39.94 04/21/2019   VD25OH 38.76 09/28/2018     HYPERTENSION: Blood pressure is controlled, managed by cardiologist.   on Bystolic twice a day and diltiazem   Lab Results    Component Value Date   CREATININE 0.94 09/06/2019   BUN 12 09/06/2019   NA 141 09/06/2019   K 4.1 09/06/2019   CL 104 09/06/2019   CO2 29 09/06/2019    History of hypercholesterolemia: Management by cardiologist and has been on 80 mg total since her MI LDL the last visit in 1/21 was above 70 and Zetia added  She has been on atorvastatin 80 mg dose, previously had been irregular with this Diet can be more consistent  Lab Results  Component Value Date   CHOL 154 09/06/2019  CHOL 298 (H) 06/03/2019   CHOL 228 (H) 04/21/2019   Lab Results  Component Value Date   HDL 41.60 09/06/2019   HDL 53.20 06/03/2019   HDL 46.90 04/21/2019   Lab Results  Component Value Date   LDLCALC 74 02/05/2019   LDLCALC 79 09/28/2018   LDLCALC 98 03/31/2018   Lab Results  Component Value Date   TRIG 209.0 (H) 09/06/2019   TRIG 362.0 (H) 06/03/2019   TRIG 281.0 (H) 04/21/2019   Lab Results  Component Value Date   CHOLHDL 4 09/06/2019   CHOLHDL 6 06/03/2019   CHOLHDL 5 04/21/2019   Lab Results  Component Value Date   LDLDIRECT 86.0 09/06/2019   LDLDIRECT 191.0 06/03/2019   LDLDIRECT 138.0 04/21/2019     Possible fatty liver: Liver functions show variable results, has not discussed with gastroenterologist   Lab Results  Component Value Date   ALT 43 (H) 09/06/2019   Periodic nausea: She takes Zofran as needed, not able to take Reglan because of interaction with antipsychotic drugs    Examination:   BP 136/70 (BP Location: Right Arm, Patient Position: Sitting, Cuff Size: Large)   Pulse (!) 110   Ht 5' 6.5" (1.689 m)   Wt 202 lb 6.4 oz (91.8 kg)   LMP 08/23/2006 (Approximate) Comment: tubal ligation  SpO2 95%   BMI 32.18 kg/m       Assessments   DIABETES with obesity:  See history of present illness for evaluation of  current management, blood sugar patterns and problems identified  Her A1c is 6.4  She is on a regimen of basal insulin, Jardiance and Ozempic 1  mg Has not monitored blood sugars very much, has had a regular exercise and inconsistent diet as discussed above For now since her A1c is fairly good we will continue the same regimen  Hypothyroidism, post ablative:  TSH is now 6.6 from not taking the full dose as above She says she can try and take the extra half a tablet more regularly if she does it on Sundays   LIPIDS: LDL is much better with taking Lipitor regularly However even with combined with Zetia her LDL is over 70 She does need to be consistent with diet May consider Nexletol if LDL stays consistently over 70  Mild increase in ALT: This is inconsistent and may be related to fatty liver, she will also discuss with gastroenterologist   History of mild osteopenia: Bone density has been ordered by her oncologist  There are no Patient Instructions on file for this visit.    Elayne Snare 09/08/2019, 1:23 PM   Note: This office note was prepared with Dragon voice recognition system technology. Any transcriptional errors that result from this process are unintentional.

## 2019-09-13 ENCOUNTER — Other Ambulatory Visit: Payer: Self-pay | Admitting: Endocrinology

## 2019-09-15 ENCOUNTER — Other Ambulatory Visit: Payer: Self-pay | Admitting: Family Medicine

## 2019-09-15 NOTE — Telephone Encounter (Signed)
She takes for hsv prophylaxis -not cold sore

## 2019-09-15 NOTE — Telephone Encounter (Addendum)
Since guidelines for dosing of this med has changed, will route to PCP for approval. CPE was on 04/23/19

## 2019-09-17 ENCOUNTER — Other Ambulatory Visit: Payer: Self-pay | Admitting: Cardiovascular Disease

## 2019-09-21 ENCOUNTER — Other Ambulatory Visit: Payer: Self-pay

## 2019-09-21 ENCOUNTER — Other Ambulatory Visit: Payer: Self-pay | Admitting: Endocrinology

## 2019-09-21 MED ORDER — EMPAGLIFLOZIN 25 MG PO TABS: ORAL_TABLET | ORAL | 2 refills | Status: DC

## 2019-09-22 ENCOUNTER — Ambulatory Visit: Payer: BC Managed Care – PPO | Admitting: Internal Medicine

## 2019-09-22 ENCOUNTER — Encounter: Payer: Self-pay | Admitting: Internal Medicine

## 2019-09-22 VITALS — BP 104/68 | HR 108 | Ht 65.5 in | Wt 206.1 lb

## 2019-09-22 DIAGNOSIS — K51919 Ulcerative colitis, unspecified with unspecified complications: Secondary | ICD-10-CM | POA: Diagnosis not present

## 2019-09-22 DIAGNOSIS — K5901 Slow transit constipation: Secondary | ICD-10-CM

## 2019-09-22 DIAGNOSIS — K219 Gastro-esophageal reflux disease without esophagitis: Secondary | ICD-10-CM | POA: Diagnosis not present

## 2019-09-22 DIAGNOSIS — R110 Nausea: Secondary | ICD-10-CM

## 2019-09-22 MED ORDER — ONDANSETRON 4 MG PO TBDP: ORAL_TABLET | ORAL | 1 refills | Status: DC

## 2019-09-22 NOTE — Patient Instructions (Signed)
We have sent the following medications to your pharmacy for you to pick up at your convenience:  Zofran  Please follow up in one year

## 2019-09-23 ENCOUNTER — Encounter: Payer: Self-pay | Admitting: Internal Medicine

## 2019-09-23 NOTE — Progress Notes (Signed)
HISTORY OF PRESENT ILLNESS:  Lindsay Fry is a 60 y.o. female with MULTIPLE significant medical problems as listed below.  Previously evaluated via telehealth medicine May 05, 2018.  Please see that dictation for details.  Her GI problems include chronic ulcerative colitis.  She has been asymptomatic on the alter therapy.  She also has a history of multiple adenomatous colon polyps and sessile serrated polyps for which she is under close surveillance.  There is a family history of colon cancer in her father.  She also has GERD which requires PPI therapy to control symptoms.  Also, chronic constipation-which went over treated results in diarrhea.  Morbid obesity.  Chronic medical problems include coronary artery disease for which she is on Plavix.  She presents today for follow-up.  She is accompanied by her husband.  She continues on Lialda 2.4 g daily.  She is pleased to report no active colitis symptoms such as mucus or bleeding per rectum.  Still with chronic IBS type bowels.  She does take Linzess.  For her GERD pantoprazole seems to control symptoms.  She is working on weight reduction.  Her last colonoscopy was 1 year ago.  She is due next year for surveillance.  She does need medication refills.  Her only other complaint is vague nausea which is fairly chronic.  No vomiting.  She does request Zofran, which helps.  She has required extensive preparations for her colonoscopies.  She does have a redundant colon.  She has completed her Covid vaccination series.  Blood work from September 06, 2019 shows unremarkable comprehensive metabolic panel except for elevated ALT.  CBC was unremarkable with hemoglobin 15.6.  Last colonoscopy was April 2020.  REVIEW OF SYSTEMS:  All non-GI ROS negative unless otherwise stated in the HPI except for anxiety, arthritis, back pain, confusion, fatigue, depression, headaches, night sweats, sleeping problems, urinary frequency, shortness of breath  Past Medical History:   Diagnosis Date  . ADD (attention deficit disorder) 11/19/2012  . Allergic rhinitis, cause unspecified   . Anxiety state, unspecified   . Arthritis   . Asymptomatic postmenopausal status (age-related) (natural)   . Atypical hyperplasia of left breast 2000  . Atypical mole 03/16/2018   R paraspinal mid upper back, excision  . Atypical mole 02/17/2018   R spinal upper back, moderate  . Atypical mole 08/11/2017   L sternum, excision  . Atypical mole 05/27/2017   L post neck, moderate  . Benign neoplasm of other and unspecified site of the digestive system   . Bipolar disorder ([place])   . Breast cancer ([place])    Left Breast 76m invasive CA left uiq, dcis left uoq and a lot ADH, ALH in both breasts.  .Marland KitchenCAD (coronary artery disease)    a. 08/2014 NSTEMI/Cath: LM nl, LAD 50p, D1/D2 min irregs, LCX nl, OM1 40, LPDA nl, RI 99ost (2.058mvessel->med Rx), RCA nl, AM 60, nl EF; b. 11/2016 MV: EF 74%, no ischemia (performed 2/2 dyspnea & new inf TWI).  . Marland Kitchenarcinoma of left breast ([place]5/19/2016  . Cataract   . Chronic diastolic CHF (congestive heart failure) ([place]   a. 08/2014 Echo: EF 60-65%, Gr 1 DD, nl LA.  . Marland Kitchenlotting disorder ([place]   on blood thiner, Plavix  . Cyst    on Achilles tendon  . Depression   . Diabetes mellitus ([place]   frank  . Dysuria   . Edema   . Family history of malignant neoplasm of gastrointestinal tract   .  Fibromyalgia   . GERD (gastroesophageal reflux disease)   . Glaucoma    pre glaucoma, 05/14/2018 pt. denies glaucoma  . Headache    migraines  . Hiatal hernia   . History of alcoholism ([place])   . History of migraines   . History of ovarian cyst   . Hx of adenomatous colonic polyps   . Insomnia, unspecified   . Myocardial infarction ([employer]) 09-04-14  . OCD (obsessive compulsive disorder)   . Osteopenia 09/25/2016   Femoral neck T -1.1  9/18  . Other screening mammogram   . Pure hypercholesterolemia   . Rosacea   . Subacute confusional state 11/19/2012  .  Tubular adenoma of colon 01/10/11  . Ulcerative colitis, unspecified   . Unspecified asthma(493.90)   . Unspecified essential hypertension   . Unspecified hypothyroidism   . Unspecified vitamin D deficiency   . Vertigo     Past Surgical History:  Procedure Laterality Date  . APPENDECTOMY    . AXILLARY SENTINEL NODE BIOPSY Left 05/23/2014   Procedure: AXILLARY SENTINEL NODE BIOPSY;  Surgeon: Christene Lye, MD;  Location: [facility] ORS;  Service: General;  Laterality: Left;  . BREAST BIOPSY  Aug 2000   on tamoxifen, atypical hyperplasia  . BREAST BIOPSY Left 04/2014  . BREAST SURGERY Left Aug 2000   lumpectomy/ Dr Sharlet Salina  . BREAST SURGERY Bilateral 05/23/14   Mastectomy  . BUNIONECTOMY Right   . CARDIAC CATHETERIZATION N/A 09/05/2014   Procedure: Left Heart Cath and Coronary Angiography;  Surgeon: Wellington Hampshire, MD;  Location: [facility];  Service: Cardiovascular;  Laterality: N/A;  . CARDIAC CATHETERIZATION  09-05-14  . CATARACT EXTRACTION W/ INTRAOCULAR LENS IMPLANT Bilateral   . CESAREAN SECTION  1996/1997   placenta previa, gest DM, pre-eclampsia  . CHOLECYSTECTOMY  1997   adhesions also  . COLONOSCOPY  01/2001   Ulcerative colitis  . COLONOSCOPY  04/2004   UC, polyp  . COLONOSCOPY  12/08   [facility], no polyps  . DEXA  04/1999 and 2010   normal  . ESOPHAGOGASTRODUODENOSCOPY  09/2001   polyp  . exercise stress test  11/2003   negative  . FEMUR FRACTURE SURGERY Left   . MASTECTOMY Bilateral   . MECKEL DIVERTICULUM EXCISION  1999  . NASAL SINUS SURGERY  05/1997  . nuclear stress test  06/2007   negative  . precancerous mole removed    . SIMPLE MASTECTOMY WITH AXILLARY SENTINEL NODE BIOPSY Bilateral 05/23/2014   Procedure: Bilateral simple mastectomy, left sentinel node biopsy ;  Surgeon: Christene Lye, MD;  Location: [facility];  Service: General;  Laterality: Bilateral;  . Sleep study  11/09   no apnea, but did snore (done by [facility])  . TONSILLECTOMY  1984   . TUBAL LIGATION    . WISDOM TOOTH EXTRACTION  1980's    Social History Lindsay Fry  reports that she quit smoking about 26 years ago. Her smoking use included cigarettes. She has a 1.25 pack-year smoking history. She has never used smokeless tobacco. She reports that she does not drink alcohol and does not use drugs.  family history includes Alzheimer's disease in her father; Breast cancer in an other family member; Colon cancer in her father; Colon polyps in her father; Coronary artery disease in her father, mother, and another family member; Crohn's disease in her mother; Dementia in her father; Diabetes in an other family member; Hypertension in her mother; Osteoporosis in her mother; Stroke in her  cousin.  Allergies  Allergen Reactions  . Ephedrine Other (See Comments)    Pt becomes hyper  . Aspartame And Phenylalanine Nausea Only  . Aspirin     REACTION: aggrivates colitis  . Crestor [Rosuvastatin]     REACTION: increased lfts  . Erythromycin     REACTION: GI upset  . Nsaids     REACTION: aggrivate colitis  . Oxycodone Other (See Comments)    Panic Attack       PHYSICAL EXAMINATION: Vital signs: BP 104/68 (BP Location: Right Arm, Patient Position: Sitting, Cuff Size: Normal)   Pulse (!) 108   Ht 5' 5.5" (1.664 m) Comment: height measured without shoes  Wt 206 lb 2 oz (93.5 kg)   LMP 08/23/2006 (Approximate) Comment: tubal ligation  BMI 33.78 kg/m   Constitutional: Obese, chronically ill-appearing, no acute distress Psychiatric: alert and oriented x3, cooperative Eyes: extraocular movements intact, anicteric, conjunctiva pink Mouth: oral pharynx moist, no lesions Neck: supple no lymphadenopathy Cardiovascular: heart regular rate and rhythm, no murmur Lungs: clear to auscultation bilaterally Abdomen: soft, nontender, nondistended, no obvious ascites, no peritoneal signs, normal bowel sounds, no organomegaly Rectal: Omitted Extremities: no clubbing or cyanosis.   Trace lower extremity edema bilaterally Skin: no lesions on visible extremities Neuro: No focal deficits.  Cranial nerves intact  ASSESSMENT: 1.  Chronic ulcerative colitis.  Asymptomatic on chronic Lialda therapy 2.  History of multiple adenomatous colon polyps and sessile serrated polyps.  Under close surveillance.  Due for surveillance April 2022 3.  Family history of colon cancer in her father 81.  GERD.  Requires PPI for control. 5.  Chronic constipation.  On Linzess for relief  6.  Morbid obesity.  Ongoing 7.  Multiple significant medical problems including insulin requiring diabetes, morbid obesity, and coronary artery disease with prior intervention on chronic Plavix 8.  Chronic nausea    PLAN:  1.  Refill GI medications 2.  Prescribe Zofran as needed for nausea. 3.  Advised not to take antidiarrheals for loose stools but rather back off on her medications for constipation.  This to avoid cyclical issues with her bowels. 4.  Weight loss 5.  Surveillance colonoscopy next year.  We will see her in the office prior to that, to arrange.

## 2019-10-16 ENCOUNTER — Other Ambulatory Visit: Payer: Self-pay | Admitting: Endocrinology

## 2019-10-18 ENCOUNTER — Other Ambulatory Visit: Payer: Self-pay | Admitting: Internal Medicine

## 2019-10-25 ENCOUNTER — Ambulatory Visit (INDEPENDENT_AMBULATORY_CARE_PROVIDER_SITE_OTHER): Payer: BC Managed Care – PPO | Admitting: Psychology

## 2019-10-25 DIAGNOSIS — F3111 Bipolar disorder, current episode manic without psychotic features, mild: Secondary | ICD-10-CM | POA: Diagnosis not present

## 2019-10-25 DIAGNOSIS — F3131 Bipolar disorder, current episode depressed, mild: Secondary | ICD-10-CM

## 2019-10-29 ENCOUNTER — Encounter: Payer: Self-pay | Admitting: Neurology

## 2019-11-08 ENCOUNTER — Telehealth: Payer: Self-pay

## 2019-11-08 NOTE — Telephone Encounter (Signed)
[facility] - Client TELEPHONE ADVICE RECORD [facility] Patient Name: Lindsay Fry Gender: Female DOB: 1961-01-06 Age: 59 Y 2 M 13 D Return Phone Number: [phone] (Primary), [phone] (Secondary) Address: City/State/Zip: [place] [address] Client [facility] - Client Client Site [place] - Day Contact Type Call Who Is Calling Patient / Member / Family / Caregiver Call Type Triage / Clinical Relationship To Patient Self Return Phone Number [phone] (Secondary) Chief Complaint Vomiting Reason for Call Symptomatic / Request for Health Information Initial Comment Caller reports that she has muscle aches, fever, nausea, vomiting and diarrhea today Translation No Nurse Assessment Nurse: Joya Gaskins, RN, Vonna Kotyk Date/Time (Eastern Time): 11/05/2019 3:10:43 PM Confirm and document reason for call. If symptomatic, describe symptoms. ---Caller states that she has muscle aches, fever, nausea, vomiting that began a couple days ago. Does the patient have any new or worsening symptoms? ---Yes Will a triage be completed? ---Yes Related visit to physician within the last 2 weeks? ---No Does the PT have any chronic conditions? (i.e. diabetes, asthma, this includes High risk factors for pregnancy, etc.) ---Yes List chronic conditions. ---heart disease, thyroid, fibromyalgia, DM, colitis Is this a behavioral health or substance abuse call? ---No Guidelines Guideline Title Affirmed Question Affirmed Notes Nurse Date/Time (Eastern Time) COVID-19 - Diagnosed or Suspected HIGH RISK for severe COVID complications (e.g., age > 51 years, obesity with BMI > 25, pregnant, chronic lung disease or other chronic medical condition) (Exception: Already seen by PCP and no new or worsening symptoms.) Rachel Moulds 11/05/2019 3:11:56 PM Disp. Time Eilene Ghazi Time) Disposition Final User 11/05/2019 3:16:17 PM Call  PCP Now Yes Joya Gaskins, RN, Harlene Ramus NOTE: All timestamps contained within this report are represented as [place] Standard Time. CONFIDENTIALTY NOTICE: This fax transmission is intended only for the addressee. It contains information that is legally privileged, confidential or otherwise protected from use or disclosure. If you are not the intended recipient, you are strictly prohibited from reviewing, disclosing, copying using or disseminating any of this information or taking any action in reliance on or regarding this information. If you have received this fax in error, please notify us immediately by telephone so that we can arrange for its return to [place]. Phone: [phone], Toll-Free: [phone], Fax: [phone] Page: 2 of 2 Call Id: [id number] [place] Disagree/Comply Comply Caller Understands Yes PreDisposition Call Doctor Care Advice Given Per Guideline CALL PCP NOW: * You need to discuss this with your doctor (or NP/PA). * I'll page the on-call provider now. If you haven't heard from the provider (or me) within 30 minutes, call again. CALL BACK IF: * You become worse CARE ADVICE given per COVID-19 - DIAGNOSED OR SUSPECTED (Adult) guideline. Comments User: Ellan Lambert, RN Date/Time Eilene Ghazi Time): 11/05/2019 3:16:13 PM Office contacted via backline; RN there advised that the pt go to [facility] for evaluation. Referrals GO TO FACILITY UNDECIDED

## 2019-11-08 NOTE — Telephone Encounter (Signed)
LVM

## 2019-11-11 ENCOUNTER — Telehealth: Payer: Self-pay

## 2019-11-11 NOTE — Telephone Encounter (Signed)
Sent PA for Linzess to Cover My Meds; awaiting response

## 2019-11-14 ENCOUNTER — Other Ambulatory Visit: Payer: Self-pay | Admitting: Cardiovascular Disease

## 2019-11-18 ENCOUNTER — Other Ambulatory Visit: Payer: Self-pay | Admitting: Family Medicine

## 2019-11-18 MED ORDER — BD PEN NEEDLE MINI U/F 31G X 5 MM MISC: 12 refills | Status: DC

## 2019-11-18 MED ORDER — CONTOUR NEXT TEST VI STRP: ORAL_STRIP | 12 refills | Status: DC

## 2019-11-18 NOTE — Telephone Encounter (Signed)
Last filled on 04/30/19 #90 caps with 5 refills (6 months of refills) , CPE was on 04/23/19

## 2019-11-19 ENCOUNTER — Other Ambulatory Visit: Payer: Self-pay | Admitting: *Deleted

## 2019-11-19 MED ORDER — LINACLOTIDE 290 MCG PO CAPS
290.0000 ug | ORAL_CAPSULE | Freq: Every day | ORAL | 6 refills | Status: DC
Start: 2019-11-19 — End: 2020-04-03

## 2019-11-19 MED ORDER — SYNTHROID 200 MCG PO TABS
ORAL_TABLET | ORAL | 5 refills | Status: DC
Start: 2019-11-19 — End: 2019-12-15

## 2019-11-19 NOTE — Telephone Encounter (Signed)
Resent Linzess with cover my meds approvel attached

## 2019-11-23 ENCOUNTER — Ambulatory Visit (INDEPENDENT_AMBULATORY_CARE_PROVIDER_SITE_OTHER): Payer: BC Managed Care – PPO | Admitting: Psychology

## 2019-11-23 DIAGNOSIS — F3131 Bipolar disorder, current episode depressed, mild: Secondary | ICD-10-CM

## 2019-12-04 DIAGNOSIS — F410 Panic disorder [episodic paroxysmal anxiety] without agoraphobia: Secondary | ICD-10-CM | POA: Diagnosis not present

## 2019-12-04 DIAGNOSIS — F3176 Bipolar disorder, in full remission, most recent episode depressed: Secondary | ICD-10-CM | POA: Diagnosis not present

## 2019-12-04 DIAGNOSIS — F3111 Bipolar disorder, current episode manic without psychotic features, mild: Secondary | ICD-10-CM | POA: Diagnosis not present

## 2019-12-04 DIAGNOSIS — F900 Attention-deficit hyperactivity disorder, predominantly inattentive type: Secondary | ICD-10-CM | POA: Diagnosis not present

## 2019-12-06 ENCOUNTER — Ambulatory Visit: Payer: BC Managed Care – PPO | Admitting: Endocrinology

## 2019-12-07 ENCOUNTER — Other Ambulatory Visit: Payer: Self-pay

## 2019-12-07 ENCOUNTER — Ambulatory Visit (INDEPENDENT_AMBULATORY_CARE_PROVIDER_SITE_OTHER): Payer: Self-pay | Admitting: Dermatology

## 2019-12-07 DIAGNOSIS — L738 Other specified follicular disorders: Secondary | ICD-10-CM

## 2019-12-07 NOTE — Patient Instructions (Signed)
Aftercare Directions  . Wash gently with soap and water everyday.   Marland Kitchen Apply Vaseline daily until healed.

## 2019-12-07 NOTE — Progress Notes (Signed)
   Follow-Up Visit   Subjective  Lindsay Fry is a 59 y.o. female who presents for the following: Sebaceous hyperplasia (face, treat with ED today, cosmetic treatment).   The following portions of the chart were reviewed this encounter and updated as appropriate:      Review of Systems:  No other skin or systemic complaints except as noted in HPI or Assessment and Plan.  Objective  Well appearing patient in no apparent distress; mood and affect are within normal limits.  A focused examination was performed including face. Relevant physical exam findings are noted in the Assessment and Plan.  Objective  Forehead: Yellow papules- numerous  Images         Assessment & Plan  Sebaceous hyperplasia Forehead  Discussed cosmetic procedure ED, noncovered.  $60 for 1st lesion and $15 for each additional lesion if done on the same day.  Maximum charge $350.  One touch-up treatment included no charge. Discussed risks of treatment including dyspigmentation, small scar, and/or recurrence.   ED performed to forehead x approx 100.  Wound care discussed.  Destruction of lesion - Forehead  Destruction method comment:  Electrodessication Informed consent: discussed and consent obtained   Outcome: patient tolerated procedure well with no complications   Post-procedure details: wound care instructions given    Return in about 6 weeks (around 01/18/2020) for ED to [facility], cosmetic.   Documentation: I have reviewed the above documentation for accuracy and completeness, and I agree with the above.  Brendolyn Patty MD

## 2019-12-09 ENCOUNTER — Encounter: Payer: Self-pay | Admitting: Family Medicine

## 2019-12-13 ENCOUNTER — Other Ambulatory Visit: Payer: Self-pay

## 2019-12-13 ENCOUNTER — Other Ambulatory Visit (INDEPENDENT_AMBULATORY_CARE_PROVIDER_SITE_OTHER): Payer: BC Managed Care – PPO

## 2019-12-13 DIAGNOSIS — E1169 Type 2 diabetes mellitus with other specified complication: Secondary | ICD-10-CM

## 2019-12-13 DIAGNOSIS — E890 Postprocedural hypothyroidism: Secondary | ICD-10-CM | POA: Diagnosis not present

## 2019-12-13 DIAGNOSIS — E782 Mixed hyperlipidemia: Secondary | ICD-10-CM | POA: Diagnosis not present

## 2019-12-13 DIAGNOSIS — E669 Obesity, unspecified: Secondary | ICD-10-CM | POA: Diagnosis not present

## 2019-12-13 LAB — BASIC METABOLIC PANEL
BUN: 13 mg/dL (ref 6–23)
CO2: 32 mEq/L (ref 19–32)
Calcium: 10.5 mg/dL (ref 8.4–10.5)
Chloride: 101 mEq/L (ref 96–112)
Creatinine, Ser: 0.83 mg/dL (ref 0.40–1.20)
GFR: 76.1 mL/min (ref >60.00)
Glucose, Bld: 174 mg/dL — ABNORMAL HIGH (ref 70–99)
Potassium: 4.3 mEq/L (ref 3.5–5.1)
Sodium: 143 mEq/L (ref 135–145)

## 2019-12-13 LAB — T4, FREE: Free T4: 0.44 ng/dL — ABNORMAL LOW (ref 0.60–1.60)

## 2019-12-13 LAB — LIPID PANEL
Cholesterol: 181 mg/dL (ref 0–200)
HDL: 50.2 mg/dL (ref >39.00)
NonHDL: 130.67
Total CHOL/HDL Ratio: 4
Triglycerides: 216 mg/dL — ABNORMAL HIGH (ref 0.0–149.0)
VLDL: 43.2 mg/dL — ABNORMAL HIGH (ref 0.0–40.0)

## 2019-12-13 LAB — HEMOGLOBIN A1C: Hgb A1c MFr Bld: 7.3 % — ABNORMAL HIGH (ref 4.6–6.5)

## 2019-12-13 LAB — TSH: TSH: 12.51 u[IU]/mL — ABNORMAL HIGH (ref 0.35–4.50)

## 2019-12-13 LAB — LDL CHOLESTEROL, DIRECT: Direct LDL: 109 mg/dL

## 2019-12-14 ENCOUNTER — Ambulatory Visit: Payer: BC Managed Care – PPO | Admitting: Family Medicine

## 2019-12-14 ENCOUNTER — Encounter: Payer: Self-pay | Admitting: Family Medicine

## 2019-12-14 VITALS — BP 136/70 | HR 101 | Temp 96.9°F | Ht 65.5 in | Wt 207.2 lb

## 2019-12-14 DIAGNOSIS — F3130 Bipolar disorder, current episode depressed, mild or moderate severity, unspecified: Secondary | ICD-10-CM | POA: Diagnosis not present

## 2019-12-14 DIAGNOSIS — H532 Diplopia: Secondary | ICD-10-CM | POA: Insufficient documentation

## 2019-12-14 DIAGNOSIS — R42 Dizziness and giddiness: Secondary | ICD-10-CM

## 2019-12-14 NOTE — Patient Instructions (Addendum)
I placed a referral to ophthalmology  The office will call   Take meclizine as needed   Let neurology know what is going on  Also psychiatry- discuss possible medication side effects    Take care of yourself   If symptoms change or worsen please let me know

## 2019-12-14 NOTE — Assessment & Plan Note (Signed)
Consistent with vertigo (some nystagmus on exam)  Has meclizine to use prn and it helps  If this becomes recurrent inst to let us know No worrisome changes on exam  Disc role of polypharmacy possible with this and balance trouble -she plans to d/w her psychiatrist  Ref to oph for double vision

## 2019-12-14 NOTE — Assessment & Plan Note (Signed)
Most recently manic Recently transitioned from lithium to tegretol due to renal changes  Multiple sedating medications Amantadine for TD symptoms  ? If adding to balance problems or dizziness or vision change  She plans to check in with her psychiatrist

## 2019-12-14 NOTE — Assessment & Plan Note (Signed)
Several episodes in conjunction with vertigo  Now feels like she could have double vision /as if borderline  Reassuring exam  Pt does seem sedated and also with poor balance  Recent nl MRI of brain w/o MS findings Ref made to oph for exam  Warned not to drive until eval   Disc poss of role of polypharmacy given balance problems as well

## 2019-12-14 NOTE — Progress Notes (Signed)
Subjective:    Patient ID: Lindsay Fry, female    DOB: February 21, 1960, 59 y.o.   MRN: [id number]  This visit occurred during the SARS-CoV-2 public health emergency.  Safety protocols were in place, including screening questions prior to the visit, additional usage of staff PPE, and extensive cleaning of exam room while observing appropriate contact time as indicated for disinfecting solutions.    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) Noted some double vision  Stood up and got dizzy and fell up against a door  Did not end up going to the wedding  Took 2 meclizine and sat still (1 pm to 930 pm)  On the 84 -power was out  Peck in back door of her house  Her cat tripped her (she hit head- not bad, no injury)  Thanksgiving day 11/25  After lunch- blood sugar 228 (right after eating sweet potato cassarole) Double vision and dizzy  Took meclizine and went to bed   Was better around 8 pm   The dizziness is a spinning feeling  Used to have it often-not for a while   No trouble opening eyes all the way (but her eyes are hooded)  She falls asleep in the chair -quickly    Having memory issues prior and poor balance  Dr Lajoyce Corners weaned her off of lithium  Change to another medicine but not until after she started tegretol (which is helping mania)   Some constipation -has to disimpact herself (takes linzess and stool softeners)      BP Readings from Last 3 Encounters:  12/14/19 136/70  09/22/19 104/68  09/08/19 136/70   Pulse Readings from Last 3 Encounters:  12/14/19 (!) 101  09/22/19 (!) 108  09/08/19 (!) 110    Amantadine 100 mg  bid  (to prevent TD from her seroquel) Carbamazepine xr 200 mg -600 mg qhs Diazepam prn- has needed to last 2 nights  Gabapentin 300 mg tid lamictal 400 mg daily seroquel xr 800 mg qhs [place] 20 mg qhs  vyvanse- on prior /she goes on /off of it    Nl CT head 12/18  MRI brain w/o contrast nl 2/21  Dr Jannifer Franklin is her neurologist -most recently seen for memory and gait disturbance and near syncope   Recently her GFR went up on lithium   She has had cataract surgery  Wears glasses for close work   Patient Active Problem List   Diagnosis Date Noted  . Double vision 12/14/2019  . Memory disturbance 08/05/2019  . Obesity (BMI 30-39.9) 04/23/2019  . Dizziness 12/16/2018  . Poor balance 12/16/2018  . Fatigue 12/16/2018  . Migraine 11/12/2018  . Exposure to COVID-19 virus 06/19/2018  . Thoracic back pain 02/19/2018  . Uncontrolled type 2 diabetes mellitus with hyperglycemia, with long-term current use of insulin ([place]) 02/19/2018  . Pleural thickening 11/05/2017  . Abrasion of right knee 11/05/2017  . Fall 11/05/2017  . Mouth ulcers 11/05/2017  . Cervical radiculopathy due to degenerative joint disease of spine 10/28/2017  . 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 Supraventricular tachycardia ([place]) 04/22/2017  .  Osteopenia 09/25/2016  . Estrogen deficiency 05/30/2016  . Need for hepatitis C screening test 03/19/2016  . Screening for HIV (human immunodeficiency virus) 03/19/2016  . Carcinoma of overlapping sites of left breast in female, estrogen receptor positive ([place]) 08/21/2015  . Chronic constipation 01/18/2015  . NSTEMI (non-ST elevated myocardial infarction) ([place]) 09/06/2014  . CAD (coronary artery disease) 09/06/2014  . DM (diabetes mellitus) ([place]) 09/05/2014  . 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 History of breast cancer 06/02/2014  . Sinus tachycardia 04/27/2014  . ADD (attention deficit disorder) 11/19/2012  . Urinary frequency 08/05/2012  . Low back pain 08/05/2012   . Hypothyroid 05/18/2012  . Chronic sinusitis 01/24/2012  . Vertigo, benign positional 01/24/2012  . Other screening mammogram 12/05/2010  . Routine general medical examination at a health care facility 10/14/2010  . FATIGUE 08/09/2009  . POSTMENOPAUSAL STATUS 06/22/2008  . Vitamin D deficiency 03/18/2008  . BENIGN NEOPLASM [place] SITE DIGESTIVE SYSTEM 01/09/2007  . HIATAL HERNIA 01/09/2007  . COLONIC POLYPS, ADENOMATOUS, HX OF 01/09/2007  . Hyperlipidemia associated with type 2 diabetes mellitus ([place]) 01/07/2007  . Bipolar I disorder, most recent episode depressed ([place]) 01/07/2007  . Essential hypertension 01/07/2007  . ALLERGIC RHINITIS 01/07/2007  . ASTHMA 01/07/2007  . GERD 01/07/2007  . Ulcerative colitis ([place]) 01/07/2007  . ACNE ROSACEA 01/07/2007  . MIGRAINES, HX OF 01/07/2007  . Fibromyalgia 10/30/2006  . INSOMNIA 10/30/2006   Past Medical History:  Diagnosis Date  . ADD (attention deficit disorder) 11/19/2012  . Allergic rhinitis, cause unspecified   . Anxiety state, unspecified   . Arthritis   . Asymptomatic postmenopausal status (age-related) (natural)   . Atypical hyperplasia of left breast 2000  . Atypical mole 03/16/2018   R paraspinal mid upper back, excision  . Atypical mole 02/17/2018   R spinal upper back, moderate  . Atypical mole 08/11/2017   L sternum, excision  . Atypical mole 05/27/2017   L post neck, moderate  . Benign neoplasm of other and unspecified site of the digestive system   . Bipolar disorder ([place])   . Breast cancer ([place])    Left Breast 67m invasive CA left uiq, dcis left uoq and a lot ADH, ALH in both breasts.  .Marland KitchenCAD (coronary artery disease)    a. 08/2014 NSTEMI/Cath: LM nl, LAD 50p, D1/D2 min irregs, LCX nl, OM1 40, LPDA nl, RI 99ost (2.082mvessel->med Rx), RCA nl, AM 60, nl EF; b. 11/2016 MV: EF 74%, no ischemia (performed 2/2 dyspnea & new inf TWI).  . Marland Kitchenarcinoma of left breast ([facility]5/19/2016  . Cataract   . Chronic diastolic  CHF (congestive heart failure) ([facility]   a. 08/2014 Echo: EF 60-65%, Gr 1 DD, nl LA.  . Marland Kitchenlotting disorder (HCAss[place]   on blood thiner, Plavix  . Cyst    on Achilles tendon  . Depression   . Diabetes mellitus ([facility]   frank  . Dysuria   . Edema   . Family history of malignant neoplasm of gastrointestinal tract   . Fibromyalgia   . GERD (gastroesophageal reflux disease)   . Glaucoma    pre glaucoma, 05/14/2018 pt. denies glaucoma  . Headache    migraines  . Hiatal hernia   . History of alcoholism ([facility]  . History of migraines   . History of ovarian cyst   . Hx of adenomatous colonic polyps   . Insomnia, unspecified   . Myocardial infarction (HCNewton8-21-16  . OCD (obsessive compulsive  disorder)   . Osteopenia 09/25/2016   Femoral neck T -1.1  9/18  . Other screening mammogram   . Pure hypercholesterolemia   . Rosacea   . Subacute confusional state 11/19/2012  . Tubular adenoma of colon 01/10/11  . Ulcerative colitis, unspecified   . Unspecified asthma(493.90)   . Unspecified essential hypertension   . Unspecified hypothyroidism   . Unspecified vitamin D deficiency   . Vertigo    Past Surgical History:  Procedure Laterality Date  . APPENDECTOMY    . AXILLARY SENTINEL NODE BIOPSY Left 05/23/2014   Procedure: AXILLARY SENTINEL NODE BIOPSY;  Surgeon: Christene Lye, MD;  Location: [facility] ORS;  Service: General;  Laterality: Left;  . BREAST BIOPSY  Aug 2000   on tamoxifen, atypical hyperplasia  . BREAST BIOPSY Left 04/2014  . BREAST SURGERY Left Aug 2000   lumpectomy/ Dr Sharlet Salina  . BREAST SURGERY Bilateral 05/23/14   Mastectomy  . BUNIONECTOMY Right   . CARDIAC CATHETERIZATION N/A 09/05/2014   Procedure: Left Heart Cath and Coronary Angiography;  Surgeon: Wellington Hampshire, MD;  Location: [facility];  Service: Cardiovascular;  Laterality: N/A;  . CARDIAC CATHETERIZATION  09-05-14  . CATARACT EXTRACTION W/ INTRAOCULAR LENS IMPLANT Bilateral   . CESAREAN SECTION   1996/1997   placenta previa, gest DM, pre-eclampsia  . CHOLECYSTECTOMY  1997   adhesions also  . COLONOSCOPY  01/2001   Ulcerative colitis  . COLONOSCOPY  04/2004   UC, polyp  . COLONOSCOPY  12/08   UC, no polyps  . DEXA  04/1999 and 2010   normal  . ESOPHAGOGASTRODUODENOSCOPY  09/2001   polyp  . exercise stress test  11/2003   negative  . FEMUR FRACTURE SURGERY Left   . MASTECTOMY Bilateral   . MECKEL DIVERTICULUM EXCISION  1999  . NASAL SINUS SURGERY  05/1997  . nuclear stress test  06/2007   negative  . precancerous mole removed    . SIMPLE MASTECTOMY WITH AXILLARY SENTINEL NODE BIOPSY Bilateral 05/23/2014   Procedure: Bilateral simple mastectomy, left sentinel node biopsy ;  Surgeon: Christene Lye, MD;  Location: [facility] ORS;  Service: General;  Laterality: Bilateral;  . Sleep study  11/09   no apnea, but did snore (done by [facility])  . TONSILLECTOMY  1984  . TUBAL LIGATION    . WISDOM TOOTH EXTRACTION  1980's   Social History   Tobacco Use  . Smoking status: Former Smoker    Packs/day: 0.25    Years: 5.00    Pack years: 1.25    Types: Cigarettes    Quit date: 01/14/1993    Years since quitting: 26.9  . Smokeless tobacco: Never Used  Vaping Use  . Vaping Use: Never used  Substance Use Topics  . Alcohol use: No    Alcohol/week: 0.0 standard drinks    Comment: Recovered ETOH  . Drug use: No   Family History  Problem Relation Age of Onset  . Coronary artery disease Father   . Colon cancer Father   . Alzheimer's disease Father   . Dementia Father   . Colon polyps Father   . Coronary artery disease Mother   . Hypertension Mother   . Osteoporosis Mother   . Crohn's disease Mother        crohns colitis  . Breast cancer Other        great aunts  . Coronary artery disease Other        Uncle (also AAA)  .  Diabetes Other        remote family history  . Stroke Cousin   . Esophageal cancer Neg Hx   . Rectal cancer Neg Hx   . Stomach cancer Neg Hx     Allergies  Allergen Reactions  . Ephedrine Other (See Comments)    Pt becomes hyper  . Aspartame And Phenylalanine Nausea Only  . Aspirin     REACTION: aggrivates colitis  . Crestor [Rosuvastatin]     REACTION: increased lfts  . Erythromycin     REACTION: GI upset  . Nsaids     REACTION: aggrivate colitis  . Oxycodone Other (See Comments)    Panic Attack   Current Outpatient Medications on File Prior to Visit  Medication Sig Dispense Refill  . acetaminophen (TYLENOL) 650 MG CR tablet Take 1,300 mg by mouth 2 (two) times daily.    Marland Kitchen amantadine (SYMMETREL) 100 MG capsule Take 100 mg by mouth 2 (two) times daily.    Marland Kitchen atorvastatin (LIPITOR) 80 MG tablet TAKE 1 TABLET(80 MG) BY MOUTH DAILY 90 tablet 0  . BAYER MICROLET LANCETS lancets USE AS DIRECTED TO CHECK BLOOD SUGAR TWICE DAILY 200 each 2  . Bioflavonoid Products (VITAMIN C) CHEW Chew 1 tablet by mouth daily.    . Blood Glucose Monitoring Suppl (CONTOUR NEXT MONITOR) w/Device KIT 1 each by Does not apply route in the morning and at bedtime. Use Contour Next meter to check blood sugar twice daily. 1 kit 0  . Calcium Carb-Cholecalciferol (CALCIUM PLUS VITAMIN D3) 600-800 MG-UNIT TABS Take 1 tablet by mouth daily.    . carbamazepine (TEGRETOL XR) 200 MG 12 hr tablet Take 200-600 mg by mouth at bedtime.    . Cholecalciferol (VITAMIN D3) 125 MCG (5000 UT) CAPS Take 1 capsule by mouth daily.    . clopidogrel (PLAVIX) 75 MG tablet TAKE 1 TABLET(75 MG) BY MOUTH DAILY Please schedule office visit for further refills. Thank you! 30 tablet 0  . co-enzyme Q-10 50 MG capsule Take 50 mg by mouth daily.    . Cyanocobalamin (VITAMIN B-12 PO) 3000 mcg-- 2 gummies daily    . diazepam (VALIUM) 10 MG tablet Take 2.5-10 mg by mouth as needed for anxiety.    Marland Kitchen diltiazem (CARDIZEM CD) 240 MG 24 hr capsule TAKE 1 CAPSULE(240 MG) BY MOUTH DAILY 90 capsule 3  . diltiazem (CARDIZEM) 30 MG tablet Take 1 tablet (30 mg) by mouth up to three times a day as  needed for a blood pressure > 150 (top number) 90 tablet 1  . docusate sodium (COLACE) 100 MG capsule Take by mouth. 3 in the morning and 2 at bedtime    . empagliflozin (JARDIANCE) 25 MG TABS tablet TAKE 1 TABLET(25 MG) BY MOUTH DAILY 30 tablet 2  . ezetimibe (ZETIA) 10 MG tablet Take 1 tablet (10 mg total) by mouth daily. 90 tablet 3  . famotidine (PEPCID) 40 MG tablet Take 40 mg by mouth 2 (two) times daily.    Marland Kitchen gabapentin (NEURONTIN) 300 MG capsule TAKE 1 CAPSULE(300 MG) BY MOUTH THREE TIMES DAILY 90 capsule 5  . glucose blood (CONTOUR NEXT TEST) test strip Use as instructed 100 each 12  . insulin detemir (LEVEMIR FLEXTOUCH) 100 UNIT/ML FlexPen Inject 51 units under the skin once daily in the evening. 30 mL 2  . Insulin Pen Needle (B-D UF III MINI PEN NEEDLES) 31G X 5 MM MISC USE FOR INJECTIONS TWICE DAILY 100 each 12  . ketoconazole (NIZORAL) 2 %  cream Apply to affected areas one to two times daily as needed. 60 g 2  . Lactobacillus-Inulin (CULTURELLE DIGESTIVE DAILY PO) Take 1 capsule by mouth daily.    Marland Kitchen lamoTRIgine (LAMICTAL) 200 MG tablet Take 400 mg by mouth daily.    Marland Kitchen latanoprost (XALATAN) 0.005 % ophthalmic solution Place 1 drop into both eyes 2 (two) times a week.     . linaclotide (LINZESS) 290 MCG CAPS capsule Take 1 capsule (290 mcg total) by mouth daily before breakfast. 30 capsule 6  . loperamide (IMODIUM A-D) 2 MG tablet Take 2 mg by mouth as needed for diarrhea or loose stools.    . Melatonin 10 MG CAPS Take 1 capsule by mouth at bedtime.    . mesalamine (LIALDA) 1.2 g EC tablet TAKE 2 TABLETS BY MOUTH DAILY 60 tablet 2  . Multiple Vitamin (MULTIVITAMIN) tablet Take 1 tablet by mouth daily.      . Omega-3 Krill Oil 500 MG CAPS Take 500 mg by mouth.    . ondansetron (ZOFRAN-ODT) 4 MG disintegrating tablet DISSOLVE 1 TABLET(4 MG) ON THE TONGUE EVERY 6 HOURS AS NEEDED FOR NAUSEA OR VOMITING 90 tablet 1  . OZEMPIC, 1 MG/DOSE, 4 MG/3ML SOPN INJECT 1MG INTO THE SKIN ONCE A WEEK 3  mL 3  . pantoprazole (PROTONIX) 40 MG tablet TAKE 1 TABLET(40 MG) BY MOUTH TWICE DAILY 60 tablet 6  . Probiotic Product (PROBIOTIC DAILY PO) Take by mouth.    . QUEtiapine (SEROQUEL XR) 400 MG 24 hr tablet Take 800 mg by mouth at bedtime.    . senna-docusate (SENOKOT-S) 8.6-50 MG tablet Take 1-2 tablets by mouth at bedtime.    . Simethicone (GAS-X PO) Take 1 tablet by mouth 4 (four) times daily.    Marland Kitchen SYNTHROID 200 MCG tablet TAKE 1 TABLET BY MOUTH 6 DAYS PER WEEK, AND 1 AND 1/2 TABLET ON 7TH DAY OF THE WEEK 34 tablet 5  . valACYclovir (VALTREX) 1000 MG tablet TAKE 1 TABLET(1000 MG) BY MOUTH DAILY 30 tablet 3  . vitamin C (ASCORBIC ACID) 500 MG tablet Take 250 mg by mouth daily. Takes 2 a day    . zaleplon (SONATA) 10 MG capsule Take 20 mg by mouth at bedtime as needed for sleep.    . nitroGLYCERIN (NITROSTAT) 0.4 MG SL tablet Place 1 tablet (0.4 mg total) under the tongue every 5 (five) minutes as needed for chest pain. (Patient not taking: Reported on 09/22/2019) 25 tablet 3  . VYVANSE 60 MG capsule Take 60 mg by mouth every morning.  (Patient not taking: Reported on 12/14/2019)  0   No current facility-administered medications on file prior to visit.     Review of Systems  Constitutional: Positive for fatigue. Negative for activity change, appetite change, fever and unexpected weight change.  HENT: Negative for congestion, ear pain, rhinorrhea, sinus pressure and sore throat.   Eyes: Positive for visual disturbance. Negative for photophobia, pain, discharge, redness and itching.  Respiratory: Negative for cough, shortness of breath and wheezing.   Cardiovascular: Negative for chest pain and palpitations.  Gastrointestinal: Negative for abdominal pain, blood in stool, constipation and diarrhea.  Endocrine: Negative for polydipsia and polyuria.  Genitourinary: Negative for dysuria, frequency and urgency.  Musculoskeletal: Negative for arthralgias, back pain and myalgias.  Skin: Negative for  pallor and rash.  Allergic/Immunologic: Negative for environmental allergies.  Neurological: Positive for dizziness and tremors. Negative for syncope, facial asymmetry, speech difficulty, weakness, numbness and headaches.  No focal weakness Is generally weak  Hematological: Negative for adenopathy. Does not bruise/bleed easily.  Psychiatric/Behavioral: Negative for decreased concentration and dysphoric mood. The patient is not nervous/anxious.        Bipolar with mania most recently        Objective:   Physical Exam Constitutional:      General: She is not in acute distress.    Appearance: Normal appearance. She is obese. She is not ill-appearing.     Comments: Seems mildly sedated  HENT:     Head: Normocephalic and atraumatic.     Right Ear: Tympanic membrane and ear canal normal.     Left Ear: Tympanic membrane and ear canal normal.     Mouth/Throat:     Mouth: Mucous membranes are moist.     Pharynx: Oropharynx is clear. No posterior oropharyngeal erythema.  Eyes:     General: No scleral icterus.       Right eye: No discharge.        Left eye: No discharge.     Conjunctiva/sclera: Conjunctivae normal.     Pupils: Pupils are equal, round, and reactive to light.     Comments: Hooded eyelids  Nl EOMs and eyes track together  She notes double vision when looking sharply up  Nystagmus 2-3 beats horizontal bilat  Neck:     Vascular: No carotid bruit.  Cardiovascular:     Rate and Rhythm: Regular rhythm. Tachycardia present.     Heart sounds: Normal heart sounds.  Pulmonary:     Effort: Pulmonary effort is normal. No respiratory distress.     Breath sounds: Normal breath sounds. No wheezing.  Musculoskeletal:     Cervical back: Normal range of motion and neck supple. No rigidity or tenderness.  Skin:    General: Skin is warm and dry.     Coloration: Skin is not pale.     Findings: No erythema or rash.  Neurological:     Mental Status: She is lethargic.     Cranial  Nerves: No cranial nerve deficit, dysarthria or facial asymmetry.     Sensory: Sensation is intact.     Motor: Motor function is intact.     Comments: Coordination is generally slow with generally poor balance  Very slow finger to nose test and RAMs  Needs help with gait              Assessment & Plan:   Problem List Items Addressed This Visit      Other   Bipolar I disorder, most recent episode depressed ([place])    Most recently manic Recently transitioned from lithium to tegretol due to renal changes  Multiple sedating medications Amantadine for TD symptoms  ? If adding to balance problems or dizziness or vision change  She plans to check in with her psychiatrist      Dizziness - Primary    Consistent with vertigo (some nystagmus on exam)  Has meclizine to use prn and it helps  If this becomes recurrent inst to let us know No worrisome changes on exam  Disc role of polypharmacy possible with this and balance trouble -she plans to d/w her psychiatrist  Ref to oph for double vision       Double vision    Several episodes in conjunction with vertigo  Now feels like she could have double vision /as if borderline  Reassuring exam  Pt does seem sedated and also with poor balance  Recent nl MRI of  brain w/o MS findings Ref made to oph for exam  Warned not to drive until eval   Disc poss of role of polypharmacy given balance problems as well        Relevant Orders   Ambulatory referral to Ophthalmology

## 2019-12-15 ENCOUNTER — Encounter: Payer: Self-pay | Admitting: Endocrinology

## 2019-12-15 ENCOUNTER — Other Ambulatory Visit: Payer: Self-pay

## 2019-12-15 ENCOUNTER — Encounter: Payer: Self-pay | Admitting: Family Medicine

## 2019-12-15 ENCOUNTER — Ambulatory Visit: Payer: BC Managed Care – PPO | Admitting: Endocrinology

## 2019-12-15 ENCOUNTER — Telehealth: Payer: Self-pay

## 2019-12-15 VITALS — BP 164/98 | HR 94 | Ht 65.5 in | Wt 207.8 lb

## 2019-12-15 DIAGNOSIS — E890 Postprocedural hypothyroidism: Secondary | ICD-10-CM | POA: Diagnosis not present

## 2019-12-15 DIAGNOSIS — Z794 Long term (current) use of insulin: Secondary | ICD-10-CM | POA: Diagnosis not present

## 2019-12-15 DIAGNOSIS — E1165 Type 2 diabetes mellitus with hyperglycemia: Secondary | ICD-10-CM | POA: Diagnosis not present

## 2019-12-15 DIAGNOSIS — E782 Mixed hyperlipidemia: Secondary | ICD-10-CM

## 2019-12-15 MED ORDER — LEVOXYL 125 MCG PO TABS: 250.0000 ug | ORAL_TABLET | Freq: Every day | ORAL | 2 refills | Status: DC

## 2019-12-15 NOTE — Patient Instructions (Addendum)
Try Ozempic app on phone,   Take 60 Levemir and keep am sugar 100-140 range  Check blood sugars on waking up 5-7  days a week  Also check blood sugars about 2 hours after meals and do this after different meals by rotation  Recommended blood sugar levels on waking up are 90-130 and about 2 hours after meal is 130-160  Please bring your blood sugar monitor to each visit, thank you

## 2019-12-15 NOTE — Telephone Encounter (Signed)
Patient states that Dr Dwyane Dee suggests patient to go back to her neurologist much sooner then the scheduled follow up on 02/07/20. Feels like her dizziness maybe coming from something else and she needs to get a thorough neurological exam. Patient states her psychiatrist does not believe that this is medication related. Patient wanted to know your opinion.  I did advise patient that since she is an established patient with neurologist she should be able to call and get scheduled on her own. Please review. Thank you

## 2019-12-15 NOTE — Progress Notes (Signed)
Patient ID: Lindsay Fry, female   DOB: [birth year], 59 y.o.   MRN: [id number]   Reason for Appointment:   followup visit  History of Present Illness:  DIABETES:  Prior history: With her high A1c of 6.7 in 03/2014 she had an abnormal glucose tolerance test indicating diabetes and 1 hour glucose of 300 She was started on metformin; however even with 1500 mg of metformin ER  blood sugars were not controlled especially fasting Subsequently with taking Janumet XR 100/1000 daily her blood sugars were much better Because of her weight gain and A1c going up to 7.3 along with occasional readings over 200 she was started on Victoza in 11/16 She has had progressive hyperglycemia previously and A1c was up to 8.6% in 4/17  She was started on Levemir insulin in 08/2015 because of progressive rise in blood sugars and inability to control with 3 other agents   Non-insulin hypoglycemic drugs: Ozempic 1 mg weekly; metformin ER 2000 mg daily, Jardiance 25 mg daily  INSULIN regimen: Levemir 51 units at bedtime  A1c is 7.3 compared to 6.4  Current management, blood sugar patterns and problems identified:  She has higher blood sugars which she thinks is related partly to not watching her diet with eating more sweets like donuts and drinking regular Sprite  Also has not done any exercise and only started 1 time recently  Even without her weight gain her blood sugars are better  She thinks she tried 55 units of Levemir to bring her sugar down but this did not help  She takes her Jardiance regularly  As before she is not checking readings after meals much but they are usually over 200 when she checks them  No side effects from Basco; however last month she missed 3 doses and possibly her blood sugars were better prior to that  GLUCOSE readings from review of the CONTOUR monitor download and averages for 30 days:   PRE-MEAL Fasting Lunch Dinner Bedtime Overall  Glucose range:  57-206     157, 230   Mean/median:  171     181   POST-MEAL PC Breakfast PC Lunch PC Dinner  Glucose range:   206  228  Mean/median:        Weight history:  Wt Readings from Last 3 Encounters:  12/15/19 207 lb 12.8 oz (94.3 kg)  12/14/19 207 lb 4 oz (94 kg)  09/22/19 206 lb 2 oz (93.5 kg)   Previous consultation with dietitian: 2016    LABS:  Lab Results  Component Value Date   HGBA1C 7.3 (H) 12/13/2019   HGBA1C 6.4 09/06/2019   HGBA1C 5.9 06/03/2019   Lab Results  Component Value Date   MICROALBUR <0.7 09/06/2019   [place]win 74 02/05/2019   CREATININE 0.83 12/13/2019   HYPOTHYROIDISM: This was first diagnosed in 1992 after her treatment for Graves' disease with I-131 She has been on relatively large doses of thyroxine supplements She was on 224 mcg since 11/13 and her TSH was normal in 3/14 Subjectively difficult to assess her thyroid since she tends to have fatigue chronically.  In 2014 her dose was reduced to 200 mcg daily and her dose has been fluctuating since then She had required periodic increase in dosage including in 05/2014 when her TSH was 20  She is  on 200 g dosage, on the brand name Synthroid She was told to take an extra half a pill once a week on Sundays  Again her energy level is  fluctuating and usually unrelated to her thyroid levels  She has been regular with the Synthroid every day before breakfast on empty stomach She is still taking the brand name         TSH is now back up to 12.5 vs 6.6  Lab Results  Component Value Date   TSH 12.51 (H) 12/13/2019   TSH 6.58 (H) 09/06/2019   TSH 3.04 06/03/2019   FREET4 0.44 (L) 12/13/2019   FREET4 0.68 09/06/2019   FREET4 0.73 06/03/2019     Lab on 12/13/2019  Component Date Value Ref Range Status  . Free T4 12/13/2019 0.44* 0.60 - 1.60 ng/dL Final   Comment: Specimens from patients who are undergoing biotin therapy and /or ingesting biotin supplements may contain high levels of biotin.  The higher  biotin concentration in these specimens interferes with this Free T4 assay.  Specimens that contain high levels  of biotin may cause false high results for this Free T4 assay.  Please interpret results in light of the total clinical presentation of the patient.    Marland Kitchen TSH 12/13/2019 12.51* 0.35 - 4.50 uIU/mL Final  . Cholesterol 12/13/2019 181  0 - 200 mg/dL Final   ATP III Classification       Desirable:  < 200 mg/dL               Borderline High:  200 - 239 mg/dL          High:  > = 240 mg/dL  . Triglycerides 12/13/2019 216.0* 0 - 149 mg/dL Final   Normal:  <150 mg/dLBorderline High:  150 - 199 mg/dL  . HDL 12/13/2019 50.20  >39.00 mg/dL Final  . VLDL 12/13/2019 43.2* 0.0 - 40.0 mg/dL Final  . Total CHOL/HDL Ratio 12/13/2019 4   Final                  Men          Women1/2 Average Risk     3.4          3.3Average Risk          5.0          4.42X Average Risk          9.6          7.13X Average Risk          15.0          11.0                      . NonHDL 12/13/2019 130.67   Final   NOTE:  Non-HDL goal should be 30 mg/dL higher than patient's LDL goal (i.e. LDL goal of < 70 mg/dL, would have non-HDL goal of < 100 mg/dL)  . Sodium 12/13/2019 143  135 - 145 mEq/L Final  . Potassium 12/13/2019 4.3  3.5 - 5.1 mEq/L Final  . Chloride 12/13/2019 101  96 - 112 mEq/L Final  . CO2 12/13/2019 32  19 - 32 mEq/L Final  . Glucose, Bld 12/13/2019 174* 70 - 99 mg/dL Final  . BUN 12/13/2019 13  6 - 23 mg/dL Final  . Creatinine, Ser 12/13/2019 0.83  0.40 - 1.20 mg/dL Final  . GFR 12/13/2019 76.10  >60.00 mL/min Final   Calculated using the CKD-EPI Creatinine Equation (2021)  . Calcium 12/13/2019 10.5  8.4 - 10.5 mg/dL Final  . Hgb A1c MFr Bld 12/13/2019 7.3* 4.6 - 6.5 % Final   Glycemic Control  Guidelines for People with Diabetes:Non Diabetic:  <6%Goal of Therapy: <7%Additional Action Suggested:  >8%   . Direct LDL 12/13/2019 109.0  mg/dL Final   Optimal:  <100 mg/dLNear or Above Optimal:  100-129  mg/dLBorderline High:  130-159 mg/dLHigh:  160-189 mg/dLVery High:  >190 mg/dL    Allergies as of 12/15/2019      Reactions   Ephedrine Other (See Comments)   Pt becomes hyper   Aspartame And Phenylalanine Nausea Only   Aspirin    REACTION: aggrivates colitis   Crestor [rosuvastatin]    REACTION: increased lfts   Erythromycin    REACTION: GI upset   Nsaids    REACTION: aggrivate colitis   Oxycodone Other (See Comments)   Panic Attack      Medication List       Accurate as of December 15, 2019  1:11 PM. If you have any questions, ask your nurse or doctor.        acetaminophen 650 MG CR tablet Commonly known as: TYLENOL Take 1,300 mg by mouth 2 (two) times daily.   amantadine 100 MG capsule Commonly known as: SYMMETREL Take 100 mg by mouth 2 (two) times daily.   atorvastatin 80 MG tablet Commonly known as: LIPITOR TAKE 1 TABLET(80 MG) BY MOUTH DAILY   B-D UF III MINI PEN NEEDLES 31G X 5 MM Misc Generic drug: Insulin Pen Needle USE FOR INJECTIONS TWICE DAILY   Bayer Microlet Lancets lancets USE AS DIRECTED TO CHECK BLOOD SUGAR TWICE DAILY   Calcium Plus Vitamin D3 600-800 MG-UNIT Tabs Generic drug: Calcium Carb-Cholecalciferol Take 1 tablet by mouth daily.   carbamazepine 200 MG 12 hr tablet Commonly known as: TEGRETOL XR Take 200-600 mg by mouth at bedtime.   clopidogrel 75 MG tablet Commonly known as: PLAVIX TAKE 1 TABLET(75 MG) BY MOUTH DAILY Please schedule office visit for further refills. Thank you!   co-enzyme Q-10 50 MG capsule Take 50 mg by mouth daily.   Contour Next Monitor w/Device Kit 1 each by Does not apply route in the morning and at bedtime. Use Contour Next meter to check blood sugar twice daily.   Contour Next Test test strip Generic drug: glucose blood Use as instructed   CULTURELLE DIGESTIVE DAILY PO Take 1 capsule by mouth daily.   diazepam 10 MG tablet Commonly known as: VALIUM Take 2.5-10 mg by mouth as needed for anxiety.    diltiazem 240 MG 24 hr capsule Commonly known as: CARDIZEM CD TAKE 1 CAPSULE(240 MG) BY MOUTH DAILY   diltiazem 30 MG tablet Commonly known as: CARDIZEM Take 1 tablet (30 mg) by mouth up to three times a day as needed for a blood pressure > 150 (top number)   docusate sodium 100 MG capsule Commonly known as: COLACE Take by mouth. 3 in the morning and 2 at bedtime   empagliflozin 25 MG Tabs tablet Commonly known as: Jardiance TAKE 1 TABLET(25 MG) BY MOUTH DAILY   ezetimibe 10 MG tablet Commonly known as: Zetia Take 1 tablet (10 mg total) by mouth daily.   famotidine 40 MG tablet Commonly known as: PEPCID Take 40 mg by mouth 2 (two) times daily.   gabapentin 300 MG capsule Commonly known as: NEURONTIN TAKE 1 CAPSULE(300 MG) BY MOUTH THREE TIMES DAILY   GAS-X PO Take 1 tablet by mouth 4 (four) times daily.   ketoconazole 2 % cream Commonly known as: NIZORAL Apply to affected areas one to two times daily as needed.   lamoTRIgine 200  MG tablet Commonly known as: LAMICTAL Take 400 mg by mouth daily.   latanoprost 0.005 % ophthalmic solution Commonly known as: XALATAN Place 1 drop into both eyes 2 (two) times a week.   Levemir FlexTouch 100 UNIT/ML FlexPen Generic drug: insulin detemir Inject 51 units under the skin once daily in the evening.   linaclotide 290 MCG Caps capsule Commonly known as: LINZESS Take 1 capsule (290 mcg total) by mouth daily before breakfast.   loperamide 2 MG tablet Commonly known as: IMODIUM A-D Take 2 mg by mouth as needed for diarrhea or loose stools.   Melatonin 10 MG Caps Take 1 capsule by mouth at bedtime.   mesalamine 1.2 g EC tablet Commonly known as: LIALDA TAKE 2 TABLETS BY MOUTH DAILY   multivitamin tablet Take 1 tablet by mouth daily.   nitroGLYCERIN 0.4 MG SL tablet Commonly known as: NITROSTAT Place 1 tablet (0.4 mg total) under the tongue every 5 (five) minutes as needed for chest pain.   Omega-3 Krill Oil 500 MG  Caps Take 500 mg by mouth.   ondansetron 4 MG disintegrating tablet Commonly known as: ZOFRAN-ODT DISSOLVE 1 TABLET(4 MG) ON THE TONGUE EVERY 6 HOURS AS NEEDED FOR NAUSEA OR VOMITING   Ozempic (1 MG/DOSE) 4 MG/3ML Sopn Generic drug: Semaglutide (1 MG/DOSE) INJECT 1MG INTO THE SKIN ONCE A WEEK   pantoprazole 40 MG tablet Commonly known as: PROTONIX TAKE 1 TABLET(40 MG) BY MOUTH TWICE DAILY   PROBIOTIC DAILY PO Take by mouth.   QUEtiapine 400 MG 24 hr tablet Commonly known as: SEROQUEL XR Take 800 mg by mouth at bedtime.   senna-docusate 8.6-50 MG tablet Commonly known as: Senokot-S Take 1-2 tablets by mouth at bedtime.   Synthroid 200 MCG tablet Generic drug: levothyroxine TAKE 1 TABLET BY MOUTH 6 DAYS PER WEEK, AND 1 AND 1/2 TABLET ON 7TH DAY OF THE WEEK   valACYclovir 1000 MG tablet Commonly known as: VALTREX TAKE 1 TABLET(1000 MG) BY MOUTH DAILY   VITAMIN B-12 PO 3000 mcg-- 2 gummies daily   vitamin C 500 MG tablet Commonly known as: ASCORBIC ACID Take 250 mg by mouth daily. Takes 2 a day   Vitamin C Chew Chew 1 tablet by mouth daily.   Vitamin D3 125 MCG (5000 UT) Caps Take 1 capsule by mouth daily.   Vyvanse 60 MG capsule Generic drug: lisdexamfetamine Take 60 mg by mouth every morning.   zaleplon 10 MG capsule Commonly known as: SONATA Take 20 mg by mouth at bedtime as needed for sleep.       Allergies:  Allergies  Allergen Reactions  . Ephedrine Other (See Comments)    Pt becomes hyper  . Aspartame And Phenylalanine Nausea Only  . Aspirin     REACTION: aggrivates colitis  . Crestor [Rosuvastatin]     REACTION: increased lfts  . Erythromycin     REACTION: GI upset  . Nsaids     REACTION: aggrivate colitis  . Oxycodone Other (See Comments)    Panic Attack    Past Medical History:  Diagnosis Date  . ADD (attention deficit disorder) 11/19/2012  . Allergic rhinitis, cause unspecified   . Anxiety state, unspecified   . Arthritis   .  Asymptomatic postmenopausal status (age-related) (natural)   . Atypical hyperplasia of left breast 2000  . Atypical mole 03/16/2018   R paraspinal mid upper back, excision  . Atypical mole 02/17/2018   R spinal upper back, moderate  . Atypical mole 08/11/2017  L sternum, excision  . Atypical mole 05/27/2017   L post neck, moderate  . Benign neoplasm of other and unspecified site of the digestive system   . Bipolar disorder ([place])   . Breast cancer ([place])    Left Breast 54m invasive CA left uiq, dcis left uoq and a lot ADH, ALH in both breasts.  .Marland KitchenCAD (coronary artery disease)    a. 08/2014 NSTEMI/Cath: LM nl, LAD 50p, D1/D2 min irregs, LCX nl, OM1 40, LPDA nl, RI 99ost (2.070mvessel->med Rx), RCA nl, AM 60, nl EF; b. 11/2016 MV: EF 74%, no ischemia (performed 2/2 dyspnea & new inf TWI).  . Marland Kitchenarcinoma of left breast ([facility]5/19/2016  . Cataract   . Chronic diastolic CHF (congestive heart failure) ([facility]   a. 08/2014 Echo: EF 60-65%, Gr 1 DD, nl LA.  . Marland Kitchenlotting disorder (HCLowell   on blood thiner, Plavix  . Cyst    on Achilles tendon  . Depression   . Diabetes mellitus ([place]   frank  . Dysuria   . Edema   . Family history of malignant neoplasm of gastrointestinal tract   . Fibromyalgia   . GERD (gastroesophageal reflux disease)   . Glaucoma    pre glaucoma, 05/14/2018 pt. denies glaucoma  . Headache    migraines  . Hiatal hernia   . History of alcoholism (HCA[place]  . History of migraines   . History of ovarian cyst   . Hx of adenomatous colonic polyps   . Insomnia, unspecified   . Myocardial infarction (HCDe Witt8-21-16  . OCD (obsessive compulsive disorder)   . Osteopenia 09/25/2016   Femoral neck T -1.1  9/18  . Other screening mammogram   . Pure hypercholesterolemia   . Rosacea   . Subacute confusional state 11/19/2012  . Tubular adenoma of colon 01/10/11  . Ulcerative colitis, unspecified   . Unspecified asthma(493.90)   . Unspecified essential hypertension   . Unspecified  hypothyroidism   . Unspecified vitamin D deficiency   . Vertigo     Past Surgical History:  Procedure Laterality Date  . APPENDECTOMY    . AXILLARY SENTINEL NODE BIOPSY Left 05/23/2014   Procedure: AXILLARY SENTINEL NODE BIOPSY;  Surgeon: SeChristene LyeMD;  Location: [facility] ORS;  Service: General;  Laterality: Left;  . BREAST BIOPSY  Aug 2000   on tamoxifen, atypical hyperplasia  . BREAST BIOPSY Left 04/2014  . BREAST SURGERY Left Aug 2000   lumpectomy/ Dr CrSharlet [place]. BREAST SURGERY Bilateral 05/23/14   Mastectomy  . BUNIONECTOMY Right   . CARDIAC CATHETERIZATION N/A 09/05/2014   Procedure: Left Heart Cath and Coronary Angiography;  Surgeon: MuWellington HampshireMD;  [facility];  Service: Cardiovascular;  Laterality: N/A;  . CARDIAC CATHETERIZATION  09-05-14  . CATARACT EXTRACTION W/ INTRAOCULAR LENS IMPLANT Bilateral   . CESAREAN SECTION  1996/1997   placenta previa, gest DM, pre-eclampsia  . CHOLECYSTECTOMY  1997   adhesions also  . COLONOSCOPY  01/2001   Ulcerative colitis  . COLONOSCOPY  04/2004   UC, polyp  . COLONOSCOPY  12/08   UC, no polyps  . DEXA  04/1999 and 2010   normal  . ESOPHAGOGASTRODUODENOSCOPY  09/2001   polyp  . exercise stress test  11/2003   negative  . FEMUR FRACTURE SURGERY Left   . MASTECTOMY Bilateral   . MECKEL DIVERTICULUM EXCISION  1999  . NASAL SINUS SURGERY  05/1997  . nuclear stress  test  06/2007   negative  . precancerous mole removed    . SIMPLE MASTECTOMY WITH AXILLARY SENTINEL NODE BIOPSY Bilateral 05/23/2014   Procedure: Bilateral simple mastectomy, left sentinel node biopsy ;  Surgeon: Christene Lye, MD;  Location: [facility] ORS;  Service: General;  Laterality: Bilateral;  . Sleep study  11/09   no apnea, but did snore (done by [facility])  . TONSILLECTOMY  1984  . TUBAL LIGATION    . WISDOM TOOTH EXTRACTION  1980's    Family History  Problem Relation Age of Onset  . Coronary artery disease Father   . Colon  cancer Father   . Alzheimer's disease Father   . Dementia Father   . Colon polyps Father   . Coronary artery disease Mother   . Hypertension Mother   . Osteoporosis Mother   . Crohn's disease Mother        crohns colitis  . Breast cancer Other        great aunts  . Coronary artery disease Other        Uncle (also AAA)  . Diabetes Other        remote family history  . Stroke Cousin   . Esophageal cancer Neg Hx   . Rectal cancer Neg Hx   . Stomach cancer Neg Hx     Social History:  reports that she quit smoking about 26 years ago. Her smoking use included cigarettes. She has a 1.25 pack-year smoking history. She has never used smokeless tobacco. She reports that she does not drink alcohol and does not use drugs.  REVIEW Of SYSTEMS:   HYPERCALCEMIA: Her calcium has been previously high PTH in the past has been borderline for hyperparathyroidism and is last below 20  She is taking OTC vitamin D3, ? 2000 units and has been on calcium recommended by her oncologist Vitamin D level therapeutic  Bone density in 9/15 shows T score -1.1 at the hip  Lab Results  Component Value Date   PTH 18 10/25/2016   CALCIUM 10.5 12/13/2019   PHOS 2.3 01/29/2007     Lab Results  Component Value Date   CALCIUM 10.5 12/13/2019   PHOS 2.3 01/29/2007   Lab Results  Component Value Date   VD25OH 41.98 06/03/2019   VD25OH 39.94 04/21/2019   VD25OH 38.76 09/28/2018     HYPERTENSION: Blood pressure is controlled, managed by cardiologist.   on Bystolic twice a day and diltiazem   Lab Results  Component Value Date   CREATININE 0.83 12/13/2019   BUN 13 12/13/2019   NA 143 12/13/2019   K 4.3 12/13/2019   CL 101 12/13/2019   CO2 32 12/13/2019    History of hypercholesterolemia: Management by cardiologist and has been on 80 mg total since her MI LDL the last visit in 1/21 was above 70 and Zetia added  She has been on atorvastatin 80 mg daily Recently her diet has not been good and  LDL appears to be higher   Lab Results  Component Value Date   CHOL 181 12/13/2019   CHOL 154 09/06/2019   CHOL 298 (H) 06/03/2019   Lab Results  Component Value Date   HDL 50.20 12/13/2019   HDL 41.60 09/06/2019   HDL 53.20 06/03/2019   Lab Results  Component Value Date   LDLCALC 74 02/05/2019   LDLCALC 79 09/28/2018   LDLCALC 98 03/31/2018   Lab Results  Component Value Date   TRIG 216.0 (H) 12/13/2019  TRIG 209.0 (H) 09/06/2019   TRIG 362.0 (H) 06/03/2019   Lab Results  Component Value Date   CHOLHDL 4 12/13/2019   CHOLHDL 4 09/06/2019   CHOLHDL 6 06/03/2019   Lab Results  Component Value Date   LDLDIRECT 109.0 12/13/2019   LDLDIRECT 86.0 09/06/2019   LDLDIRECT 191.0 06/03/2019     Possible fatty liver: Liver functions show variable results   Lab Results  Component Value Date   ALT 43 (H) 09/06/2019       Examination:   BP (!) 164/98   Pulse 94   Ht 5' 5.5" (1.664 m)   Wt 207 lb 12.8 oz (94.3 kg)   LMP 08/23/2006 (Approximate) Comment: tubal ligation  SpO2 94%   BMI 34.05 kg/m       Assessments   DIABETES with obesity:  See history of present illness for evaluation of  current management, blood sugar patterns and problems identified  Her A1c is higher than usual at 7.3, previously was 6.4  She is on a regimen of basal insulin, Jardiance and Ozempic 1 mg Not clear if she is getting a progression of her diabetes and requiring more insulin or sugars are higher from poor diet; her fasting readings are fairly consistently high over 150 at least She can do better with diet and exercise also and as usual has difficulty losing weight even with Ozempic and Jardiance  Monitoring after meals is still incomplete  Hypothyroidism, post ablative:  TSH is now further increased at 12.5 even though she is taking the extra half pill weekly on her brand-name Synthroid 200 mcg 6.6 from not taking the full dose as above  Since she is getting the  equivalent of 215 mcg of Synthroid will go up to 250 Levoxyl is better covered by her insurance and will switch  LIPIDS: She thinks she is taking her Lipitor regularly However even with adding Zetia her LDL is over 70 She does need to be consistent with diet Also currently is mildly hypothyroid and this may influence her LDL  May consider Nexletol if LDL stays consistently over 70  Episodes of diplopia and vertigo: She is going to see an ophthalmologist as per her PCP but likely needs to be seen by neurologist  There are no Patient Instructions on file for this visit.    Elayne Snare 12/15/2019, 1:11 PM   Note: This office note was prepared with Dragon voice recognition system technology. Any transcriptional errors that result from this process are unintentional.

## 2019-12-15 NOTE — Telephone Encounter (Signed)
Agree if Dr Lajoyce Corners thinks so - if she has trouble getting an appt please let me know

## 2019-12-16 ENCOUNTER — Encounter: Payer: Self-pay | Admitting: Nurse Practitioner

## 2019-12-16 ENCOUNTER — Telehealth: Payer: Self-pay

## 2019-12-16 ENCOUNTER — Ambulatory Visit: Payer: BC Managed Care – PPO | Admitting: Nurse Practitioner

## 2019-12-16 ENCOUNTER — Encounter: Payer: Self-pay | Admitting: Endocrinology

## 2019-12-16 VITALS — BP 150/96 | HR 88 | Ht 65.5 in | Wt 207.0 lb

## 2019-12-16 DIAGNOSIS — I2510 Atherosclerotic heart disease of native coronary artery without angina pectoris: Secondary | ICD-10-CM | POA: Diagnosis not present

## 2019-12-16 DIAGNOSIS — E785 Hyperlipidemia, unspecified: Secondary | ICD-10-CM

## 2019-12-16 DIAGNOSIS — R42 Dizziness and giddiness: Secondary | ICD-10-CM

## 2019-12-16 DIAGNOSIS — I10 Essential (primary) hypertension: Secondary | ICD-10-CM

## 2019-12-16 MED ORDER — DILTIAZEM HCL ER BEADS 300 MG PO CP24: 300.0000 mg | ORAL_CAPSULE | Freq: Every day | ORAL | 0 refills | Status: DC

## 2019-12-16 NOTE — Patient Instructions (Addendum)
Medication Instructions:   Your physician has recommended you make the following change in your medication:   1. INCREASE Diltiazem - Take one tablet (300MG) by mouth daily.   *If you need a refill on your cardiac medications before your next appointment, please call your pharmacy*   Lab Work:  1. None Ordered  If you have labs (blood work) drawn today and your tests are completely normal, you will receive your results only by: Marland Kitchen MyChart Message (if you have MyChart) OR . A paper copy in the mail If you have any lab test that is abnormal or we need to change your treatment, we will call you to review the results.   Testing/Procedures:  1. None Ordered   Follow-Up: At [facility], you and your health needs are our priority.  As part of our continuing mission to provide you with exceptional heart care, we have created designated Provider Care Teams.  These Care Teams include your primary Cardiologist (physician) and Advanced Practice Providers (APPs -  Physician Assistants and Nurse Practitioners) who all work together to provide you with the care you need, when you need it.  We recommend signing up for the patient portal called "MyChart".  Sign up information is provided on this After Visit Summary.  MyChart is used to connect with patients for Virtual Visits (Telemedicine).  Patients are able to view lab/test results, encounter notes, upcoming appointments, etc.  Non-urgent messages can be sent to your provider as well.   To learn more about what you can do with MyChart, go to [email or web address].    Your next appointment:   3 month(s)  The format for your next appointment:   In Person  Provider:   Ida Rogue, MD   Other Instructions   You have been Referred to [facility] for Vestibular training for vertigo.

## 2019-12-16 NOTE — Telephone Encounter (Signed)
Called patient again to review AVS.  No Answer. LMOV

## 2019-12-16 NOTE — Telephone Encounter (Signed)
Patient left the office before getting her AVS.  Called patient to go over AVS with her.  No answer. LMOV.

## 2019-12-16 NOTE — Telephone Encounter (Signed)
See my chart message

## 2019-12-16 NOTE — Progress Notes (Signed)
Office Visit    Patient Name: Lindsay Fry Date of Encounter: 12/16/2019  Primary Care Provider:  Abner Greenspan, MD Primary Cardiologist:  Ida Rogue, MD  Chief Complaint    59 year-old female with a history of CAD s/p non-STEMI in August 2016, hypertension, hyperlipidemia, HFpEF, diabetes, hypothyroidism, bipolar disorder, anxiety, fibromyalgia, migraine disorder, L sided breast cancer s/p bilateral mastectomy, and ulcerative colitis who presents for follow-up of her CAD.   Past Medical History    Past Medical History:  Diagnosis Date  . ADD (attention deficit disorder) 11/19/2012  . Allergic rhinitis, cause unspecified   . Anxiety state, unspecified   . Arthritis   . Asymptomatic postmenopausal status (age-related) (natural)   . Atypical hyperplasia of left breast 2000  . Atypical mole 03/16/2018   R paraspinal mid upper back, excision  . Atypical mole 02/17/2018   R spinal upper back, moderate  . Atypical mole 08/11/2017   L sternum, excision  . Atypical mole 05/27/2017   L post neck, moderate  . Benign neoplasm of other and unspecified site of the digestive system   . Bipolar disorder ([place])   . Breast cancer ([place])    Left Breast 3m invasive CA left uiq, dcis left uoq and a lot ADH, ALH in both breasts.  .Marland KitchenCAD (coronary artery disease)    a. 08/2014 NSTEMI/Cath: LM nl, LAD 50p, D1/D2 min irregs, LCX nl, OM1 40, LPDA nl, RI 99ost (2.057mvessel->med Rx), RCA nl, AM 60, nl EF; b. 11/2016 MV: EF 74%, no ischemia (performed 2/2 dyspnea & new inf TWI).  . Marland Kitchenarcinoma of left breast (HCGilman5/19/2016  . Cataract   . Chronic diastolic CHF (congestive heart failure) (HCKeene   a. 08/2014 Echo: EF 60-65%, Gr 1 DD, nl LA.  . Marland Kitchenlotting disorder (HCCrows Nest   on blood thiner, Plavix  . Cyst    on Achilles tendon  . Depression   . Diabetes mellitus ([facility]   frank  . Dysuria   . Edema   . Family history of malignant neoplasm of gastrointestinal tract   . Fibromyalgia   . GERD  (gastroesophageal reflux disease)   . Glaucoma    pre glaucoma, 05/14/2018 pt. denies glaucoma  . Headache    migraines  . Hiatal hernia   . History of alcoholism (HCManata  . History of migraines   . History of ovarian cyst   . Hx of adenomatous colonic polyps   . Insomnia, unspecified   . Myocardial infarction (HCCurlew8-21-16  . OCD (obsessive compulsive disorder)   . Osteopenia 09/25/2016   Femoral neck T -1.1  9/18  . Other screening mammogram   . Pure hypercholesterolemia   . Rosacea   . Subacute confusional state 11/19/2012  . Tubular adenoma of colon 01/10/11  . Ulcerative colitis, unspecified   . Unspecified asthma(493.90)   . Unspecified essential hypertension   . Unspecified hypothyroidism   . Unspecified vitamin D deficiency   . Vertigo    Past Surgical History:  Procedure Laterality Date  . APPENDECTOMY    . AXILLARY SENTINEL NODE BIOPSY Left 05/23/2014   Procedure: AXILLARY SENTINEL NODE BIOPSY;  Surgeon: SeChristene LyeMD;  Location: [facility] ORS;  Service: General;  Laterality: Left;  . BREAST BIOPSY  Aug 2000   on tamoxifen, atypical hyperplasia  . BREAST BIOPSY Left 04/2014  . BREAST SURGERY Left Aug 2000   lumpectomy/ Dr CrSharlet Sal[place]. BREAST SURGERY Bilateral 05/23/14  Mastectomy  . BUNIONECTOMY Right   . CARDIAC CATHETERIZATION N/A 09/05/2014   Procedure: Left Heart Cath and Coronary Angiography;  Surgeon: Wellington Hampshire, MD;  Location: [facility];  Service: Cardiovascular;  Laterality: N/A;  . CARDIAC CATHETERIZATION  09-05-14  . CATARACT EXTRACTION W/ INTRAOCULAR LENS IMPLANT Bilateral   . CESAREAN SECTION  1996/1997   placenta previa, gest DM, pre-eclampsia  . CHOLECYSTECTOMY  1997   adhesions also  . COLONOSCOPY  01/2001   Ulcerative colitis  . COLONOSCOPY  04/2004   UC, polyp  . COLONOSCOPY  12/08   UC, no polyps  . DEXA  04/1999 and 2010   normal  . ESOPHAGOGASTRODUODENOSCOPY  09/2001   polyp  . exercise stress test  11/2003   negative    . FEMUR FRACTURE SURGERY Left   . MASTECTOMY Bilateral   . MECKEL DIVERTICULUM EXCISION  1999  . NASAL SINUS SURGERY  05/1997  . nuclear stress test  06/2007   negative  . precancerous mole removed    . SIMPLE MASTECTOMY WITH AXILLARY SENTINEL NODE BIOPSY Bilateral 05/23/2014   Procedure: Bilateral simple mastectomy, left sentinel node biopsy ;  Surgeon: Christene Lye, MD;  Location: [facility] ORS;  Service: General;  Laterality: Bilateral;  . Sleep study  11/09   no apnea, but did snore (done by [facility])  . TONSILLECTOMY  1984  . TUBAL LIGATION    . WISDOM TOOTH EXTRACTION  1980's    Allergies  Allergies  Allergen Reactions  . Ephedrine Other (See Comments)    Pt becomes hyper  . Aspartame And Phenylalanine Nausea Only  . Aspirin     REACTION: aggrivates colitis  . Crestor [Rosuvastatin]     REACTION: increased lfts  . Erythromycin     REACTION: GI upset  . Nsaids     REACTION: aggrivate colitis  . Oxycodone Other (See Comments)    Panic Attack    History of Present Illness    59 year-old female with the above past medical history including CAD s/p non-STEMI in August 2016, hypertension, hyperlipidemia, HFpEF, diabetes, hypothyroidism, bipolar disorder, anxiety, fibromyalgia, migraine disorder, L sided breast cancer s/p bilateral mastectomy, and ulcerative colitis. Cardiac history dates back to 2016 when she was admitted to the hospital with a non-STEMI. Catheterization showed severe ostial ramus intermedius disease which was managed medically. She otherwise had nonobstructive CAD. Echo at the time of her hospitalization showed an EF of 96-28%, grade I diastolic dysfunction. Lower extremity ABIs in 2016 were normal. Zio patch from 2018 showed predominantly normal sinus rhythm with an average HR of 76 bpm, first-degree AV block, rare PACs, atrial couplets, PVCs,  and ventricular triplets. No significant arrhythmias noted. She was seen in 10/2016 with reported dyspnea on  exertion and was found to have new anterior T wave changes. Subsequent nuclear stress test showed no evidence of ischemia. She later described chest pressure during a virtual visit in 2020, that was attributed to stress and anxiety. BP was elevated at the time and she was advised to take short-acting diltiazem as needed for SBP > 150 mmHg. No further cardiac work-up was recommended.   She developed gait instability and memory disturbance late 2020 and was evaluated by neurology in January 2021. MRI of the brain 02/2019 was unrevealing. Carotid dopplers were normal on 08/12/2019. She was last seen in our clinic in May 2021 and was doing well from a cardiac standpoint. However, since then she has had increasing episodes of dizziness  and double vision. She saw her PCP on 12/14/2019, who thought her dizziness was consistent with vertigo and possibly related to polypharmacy. Her PCP referred her to ophthalmology for her double vision and recommended she follow up with neurology as scheduled. She saw her endocrinologist on 12/15/2019, who recommended she see her neurologist sooner than scheduled (02/07/20) for a through neurological exam.   Since her last clinic visit she has had almost weekly episodes where she feels like the room is spinning and has fallen twice due to blurred vision. These episodes last for approximately 30 minutes and are resolve with meclizine and are similar to episodes that have been occurring since 2014 (prev event monitoring in 2018 unremarkable). Additionally, she reports an elevated blood pressure at home over the past two days, with readings in the 160s/100s. She took her prn diltiazem three times last night over a period of 6 hours. She denies chest pain, palpitations, dyspnea, pnd, orthopnea, n, v, syncope, edema, weight gain, or early satiety.   Home Medications    Prior to Admission medications   Medication Sig Start Date End Date Taking? Authorizing Provider  acetaminophen  (TYLENOL) 650 MG CR tablet Take 1,300 mg by mouth 2 (two) times daily.    [provider]  amantadine (SYMMETREL) 100 MG capsule Take 100 mg by mouth 2 (two) times daily.    [provider]  atorvastatin (LIPITOR) 80 MG tablet TAKE 1 TABLET(80 MG) BY MOUTH DAILY 09/21/19   Dunn, Areta Haber, PA-C  BAYER MICROLET LANCETS lancets USE AS DIRECTED TO CHECK BLOOD SUGAR TWICE DAILY 05/20/16   Elayne Snare, MD  Bioflavonoid Products (VITAMIN C) CHEW Chew 1 tablet by mouth daily.    [provider]  Blood Glucose Monitoring Suppl (CONTOUR NEXT MONITOR) w/Device KIT 1 each by Does not apply route in the morning and at bedtime. Use Contour Next meter to check blood sugar twice daily. 04/29/19   Elayne Snare, MD  Calcium Carb-Cholecalciferol (CALCIUM PLUS VITAMIN D3) 600-800 MG-UNIT TABS Take 1 tablet by mouth daily.    [provider]  carbamazepine (TEGRETOL XR) 200 MG 12 hr tablet Take 200-600 mg by mouth at bedtime. 12/04/19   [provider]  Cholecalciferol (VITAMIN D3) 125 MCG (5000 UT) CAPS Take 1 capsule by mouth daily.    [provider]  clopidogrel (PLAVIX) 75 MG tablet TAKE 1 TABLET(75 MG) BY MOUTH DAILY Please schedule office visit for further refills. Thank you! 11/15/19   Minna Merritts, MD  co-enzyme Q-10 50 MG capsule Take 50 mg by mouth daily.    [provider]  Cyanocobalamin (VITAMIN B-12 PO) 3000 mcg-- 2 gummies daily    [provider]  diazepam (VALIUM) 10 MG tablet Take 2.5-10 mg by mouth as needed for anxiety.    [provider]  diltiazem (CARDIZEM CD) 240 MG 24 hr capsule TAKE 1 CAPSULE(240 MG) BY MOUTH DAILY 12/25/18   Gollan, Kathlene November, MD  diltiazem (CARDIZEM) 30 MG tablet Take 1 tablet (30 mg) by mouth up to three times a day as needed for a blood pressure > 150 (top number) 07/29/19   Dunn, Areta Haber, PA-C  docusate sodium (COLACE) 100 MG capsule Take by mouth. 3 in the morning and 2 at bedtime    [provider]  empagliflozin (JARDIANCE) 25 MG TABS tablet TAKE 1 TABLET(25 MG) BY MOUTH DAILY 09/21/19   Shamleffer, Melanie Crazier, MD  ezetimibe (ZETIA) 10 MG tablet Take 1 tablet (10 mg total)  by mouth daily. 02/07/19   Elayne Snare, MD  famotidine (PEPCID) 40 MG tablet Take 40 mg by mouth 2 (two) times daily.    [provider]  gabapentin (NEURONTIN) 300 MG capsule TAKE 1 CAPSULE(300 MG) BY MOUTH THREE TIMES DAILY 11/18/19   Tower, [place] A, MD  glucose blood (CONTOUR NEXT TEST) test strip Use as instructed 11/18/19   Elayne Snare, MD  insulin detemir (LEVEMIR FLEXTOUCH) 100 UNIT/ML FlexPen Inject 51 units under the skin once daily in the evening. 03/22/19   Elayne Snare, MD  Insulin Pen Needle (B-D UF III MINI PEN NEEDLES) 31G X 5 MM MISC USE FOR INJECTIONS TWICE DAILY 11/18/19   Elayne Snare, MD  ketoconazole (NIZORAL) 2 % cream Apply to affected areas one to two times daily as needed. 05/11/19   Brendolyn Patty, MD  Lactobacillus-Inulin (CULTURELLE DIGESTIVE DAILY PO) Take 1 capsule by mouth daily.    [provider]  lamoTRIgine (LAMICTAL) 200 MG tablet Take 400 mg by mouth daily.    [provider]  latanoprost (XALATAN) 0.005 % ophthalmic solution Place 1 drop into both eyes 2 (two) times a week.  10/12/10   [provider]  LEVOXYL 125 MCG tablet Take 2 tablets (250 mcg total) by mouth daily before breakfast. 12/15/19   Elayne Snare, MD  linaclotide [facility]) 290 MCG CAPS capsule Take 1 capsule (290 mcg total) by mouth daily before breakfast. 11/19/19   Irene Shipper, MD  loperamide (IMODIUM A-D) 2 MG tablet Take 2 mg by mouth as needed for diarrhea or loose stools.    [provider]  Melatonin 10 MG CAPS Take 1 capsule by mouth at bedtime.    [provider]  mesalamine (LIALDA) 1.2 g EC tablet TAKE 2 TABLETS BY MOUTH DAILY 09/06/19   Irene Shipper, MD  Multiple Vitamin (MULTIVITAMIN) tablet Take 1 tablet by mouth daily.      [provider]   nitroGLYCERIN (NITROSTAT) 0.4 MG SL tablet Place 1 tablet (0.4 mg total) under the tongue every 5 (five) minutes as needed for chest pain. Patient not taking: Reported on 09/22/2019 10/28/18 09/22/19  Minna Merritts, MD  Omega-3 Krill Oil 500 MG CAPS Take 500 mg by mouth.    [provider]  ondansetron (ZOFRAN-ODT) 4 MG disintegrating tablet DISSOLVE 1 TABLET(4 MG) ON THE TONGUE EVERY 6 HOURS AS NEEDED FOR NAUSEA OR VOMITING 09/22/19   Irene Shipper, MD  OZEMPIC, 1 MG/DOSE, 4 MG/3ML SOPN INJECT 1MG INTO THE SKIN ONCE A WEEK 09/21/19   Elayne Snare, MD  pantoprazole (PROTONIX) 40 MG tablet TAKE 1 TABLET(40 MG) BY MOUTH TWICE DAILY 10/18/19   Irene Shipper, MD  Probiotic Product (PROBIOTIC DAILY PO) Take by mouth.    [provider]  QUEtiapine (SEROQUEL XR) 400 MG 24 hr tablet Take 800 mg by mouth at bedtime.    [provider]  senna-docusate (SENOKOT-S) 8.6-50 MG tablet Take 1-2 tablets by mouth at bedtime.    [provider]  Simethicone (GAS-X PO) Take 1 tablet by mouth 4 (four) times daily.    [provider]  valACYclovir (VALTREX) 1000 MG tablet TAKE 1 TABLET(1000 MG) BY MOUTH DAILY 09/15/19   Tower, Wynelle Fanny, MD  vitamin C (ASCORBIC ACID) 500 MG tablet Take 250 mg by mouth daily. Takes 2 a day    [provider]  VYVANSE 60 MG capsule Take 60 mg by mouth every morning.  10/05/17   [provider]  zaleplon (SONATA) 10 MG capsule Take 20 mg by mouth at bedtime as needed for sleep.    [provider]    Review of Systems    Intermittent dizziness/verigo. She denies chest pain, palpitations, dyspnea, pnd, orthopnea, n, v, syncope, edema, weight gain, or early satiety. All other systems reviewed and are otherwise negative except as noted above.  Physical Exam    VS:  BP (!) 150/96   Pulse 88   Ht 5' 5.5" (1.664 m)   Wt 207 lb (93.9 kg)   LMP 08/23/2006 (Approximate) Comment: tubal ligation  BMI 33.92 kg/m   GEN: Obese, in  no acute distress. HEENT: normal. Neck: Supple, no JVD, carotid bruits, or masses. Cardiac: RRR, no murmurs, rubs, or gallops. No clubbing, cyanosis, edema. DP 2+ and equal bilaterally.  Respiratory:  Respirations regular and unlabored, clear to auscultation bilaterally. GI: Firm, nontender, mildly distended, BS + x 4. MS: no deformity or atrophy. Skin: warm and dry, no rash. Neuro:  Strength and sensation are intact. Psych: Normal affect.  Accessory Clinical Findings    ECG personally reviewed by me today - Sinus rhythm with 1st degree AV block, left atrial enlargement- no acute changes.  Lab Results  Component Value Date   WBC 8.9 08/30/2019   HGB 15.6 (H) 08/30/2019   HCT 46.7 (H) 08/30/2019   MCV 94.0 08/30/2019   PLT 348 08/30/2019   Lab Results  Component Value Date   CREATININE 0.83 12/13/2019   BUN 13 12/13/2019   NA 143 12/13/2019   K 4.3 12/13/2019   CL 101 12/13/2019   CO2 32 12/13/2019   Lab Results  Component Value Date   ALT 43 (H) 09/06/2019   AST 36 09/06/2019   ALKPHOS 81 09/06/2019   BILITOT 0.6 09/06/2019   Lab Results  Component Value Date   CHOL 181 12/13/2019   HDL 50.20 12/13/2019   LDLCALC 74 02/05/2019   LDLDIRECT 109.0 12/13/2019   TRIG 216.0 (H) 12/13/2019   CHOLHDL 4 12/13/2019    Lab Results  Component Value Date   HGBA1C 7.3 (H) 12/13/2019    Assessment & Plan    1. Essential hypertension: Blood pressure elevated at 150/95 today, recheck 160/72. She also reports increased home BP readings over the past three weeks, with several home readings in the past 48 hours in the 160s/100s. She is currently taking diltiazem 260 mg CD daily with an additional 30 mg of short acting diltiazem as needed for SBP >150 mmHg. She took three does of her prn diltiazem last night. Given elevated BP readings and need for additional diltiazem, will increase diltiazem to 300 mg CD daily. Recommend daily home blood pressure monitoring. Follow-up in clinic in  3 months.   2. CAD: S/p non-STEMI in August 2016. Catheterization showed severe ostial ramus intermedius disease which was managed medically. She otherwise had nonobstructive CAD. She currently doing well with no symptoms of angina. Continue current medical therpay with plavix, statin and zetia. She is not on aspirin secondary to colitis. No plans for further ischemic evaluation at this time.   3. Dizziness/Vertigo: She has had increasing episodes of dizziness and double vision. She saw her PCP on 12/14/2019, who thought her dizziness was consistent with vertigo and possibly related to polypharmacy. MRI of the brain in 02/2019 was unrevealing. Carotid dopplers were normal on 08/12/2019. Her PCP referred her to ophthalmology for her double vision and recommended she follow up with neurology as scheduled. She saw her endocrinologist on  12/15/2019, who recommended she see her neurologist sooner than scheduled (02/07/20) for a through neurological exam. She has had almost weekly episodes where she feels like the room is spinning and has fallen twice due to blurred vision. These episodes last for approximately 30 minutes and resolve with meclizine. She has never undergone vestibular rehabilitation therapy and is interested in pursuing this. Will place order for referral to PT for vestibular rehabilitation. Continue prn meclizine. Follow-up with PCP and neurology as scheduled.  4. Hyperlipidemia:  Direct LDL of 109 from 11/2019, goal less than 70. Encouraged lifestyle modifications with diet and exercise. Continue atorvastatin 80 mg daily and Zetia.  Will consider repatha @ f/u.   5. Diabetes: A1c 7.3 on 12/13/2019. Currently taking Jadiance, Ozempic and basal insulin. Managed per endocrinology.   7. Disposition: Will send referral to PT for vestibular rehabilitation today. Follow-up in clinic in 3 months or sooner if needed.   Murray Hodgkins, NP 12/16/2019, 7:26 AM

## 2019-12-17 NOTE — Telephone Encounter (Signed)
Called patient to review AVS with patient. No answer. LMOV

## 2019-12-20 ENCOUNTER — Encounter: Payer: Self-pay | Admitting: Family Medicine

## 2019-12-21 ENCOUNTER — Ambulatory Visit: Payer: BC Managed Care – PPO | Admitting: Psychology

## 2019-12-22 ENCOUNTER — Encounter: Payer: Self-pay | Admitting: Family Medicine

## 2019-12-22 ENCOUNTER — Telehealth: Payer: Self-pay | Admitting: Neurology

## 2019-12-22 MED ORDER — PREDNISONE 10 MG PO TABS: ORAL_TABLET | ORAL | 0 refills | Status: DC

## 2019-12-22 NOTE — Telephone Encounter (Signed)
Pt called, having migraines 1 headache last 10 hours, 3 migraines per week. Would like a call from the nurse.

## 2019-12-23 NOTE — Telephone Encounter (Signed)
I called pt.  She is better today.  Takes tylenol and aleve for her headaches. Looks like she was seen for migraines 2014??  By Dr. Jannifer Franklin.   Has appt 02-07-20 forth f/u with SS/NP 6 mo f/u.  Placed on cancellation list.

## 2019-12-24 ENCOUNTER — Other Ambulatory Visit: Payer: Self-pay | Admitting: Cardiovascular Disease

## 2019-12-24 ENCOUNTER — Other Ambulatory Visit: Payer: Self-pay | Admitting: Internal Medicine

## 2019-12-24 ENCOUNTER — Encounter: Payer: Self-pay | Admitting: Family Medicine

## 2019-12-24 NOTE — Addendum Note (Signed)
Addended by: Therisa Doyne on: 12/24/2019 02:38 PM   Modules accepted: Orders

## 2019-12-26 ENCOUNTER — Other Ambulatory Visit: Payer: Self-pay | Admitting: Endocrinology

## 2019-12-26 ENCOUNTER — Other Ambulatory Visit: Payer: Self-pay | Admitting: Internal Medicine

## 2019-12-27 ENCOUNTER — Other Ambulatory Visit: Payer: Self-pay | Admitting: *Deleted

## 2019-12-27 MED ORDER — LEVEMIR FLEXTOUCH 100 UNIT/ML ~~LOC~~ SOPN: PEN_INJECTOR | SUBCUTANEOUS | 2 refills | Status: DC

## 2019-12-27 MED ORDER — LEVOXYL 125 MCG PO TABS: 250.0000 ug | ORAL_TABLET | Freq: Every day | ORAL | 3 refills | Status: DC

## 2019-12-28 ENCOUNTER — Encounter: Payer: Self-pay | Admitting: Neurology

## 2019-12-28 ENCOUNTER — Ambulatory Visit (INDEPENDENT_AMBULATORY_CARE_PROVIDER_SITE_OTHER): Payer: BC Managed Care – PPO | Admitting: Psychology

## 2019-12-28 DIAGNOSIS — F3131 Bipolar disorder, current episode depressed, mild: Secondary | ICD-10-CM | POA: Diagnosis not present

## 2019-12-30 ENCOUNTER — Other Ambulatory Visit: Payer: Self-pay | Admitting: Physician Assistant

## 2019-12-30 NOTE — Telephone Encounter (Signed)
Rx request sent to pharmacy.  

## 2019-12-31 ENCOUNTER — Encounter: Payer: Self-pay | Admitting: Family Medicine

## 2019-12-31 DIAGNOSIS — H532 Diplopia: Secondary | ICD-10-CM | POA: Diagnosis not present

## 2020-01-03 ENCOUNTER — Telehealth: Payer: Self-pay | Admitting: Family Medicine

## 2020-01-03 DIAGNOSIS — H8110 Benign paroxysmal vertigo, unspecified ear: Secondary | ICD-10-CM

## 2020-01-03 NOTE — Telephone Encounter (Signed)
Urgent ref because I think she has appt soon

## 2020-01-03 NOTE — Telephone Encounter (Signed)
Pt called in needed a referral for PT and she is seeing , [facility] [place] [facility] [place]

## 2020-01-03 NOTE — Telephone Encounter (Signed)
Let detailed message on patient's voicemail letting her know that I sent referral over to [facility] in [place].

## 2020-01-04 DIAGNOSIS — H8110 Benign paroxysmal vertigo, unspecified ear: Secondary | ICD-10-CM | POA: Diagnosis not present

## 2020-01-04 DIAGNOSIS — R2681 Unsteadiness on feet: Secondary | ICD-10-CM | POA: Diagnosis not present

## 2020-01-04 DIAGNOSIS — R2689 Other abnormalities of gait and mobility: Secondary | ICD-10-CM | POA: Diagnosis not present

## 2020-01-19 ENCOUNTER — Other Ambulatory Visit: Payer: Self-pay

## 2020-01-19 ENCOUNTER — Ambulatory Visit: Payer: BC Managed Care – PPO | Admitting: Dermatology

## 2020-01-19 DIAGNOSIS — L738 Other specified follicular disorders: Secondary | ICD-10-CM

## 2020-01-19 DIAGNOSIS — L820 Inflamed seborrheic keratosis: Secondary | ICD-10-CM | POA: Diagnosis not present

## 2020-01-19 NOTE — Patient Instructions (Signed)
Electrocautery Aftercare  . Wash gently with soap and water everyday.   Marland Kitchen Apply Vaseline daily until healed.

## 2020-01-19 NOTE — Progress Notes (Signed)
   Follow-Up Visit   Subjective  Lindsay Fry is a 60 y.o. female who presents for the following: Sebaceous Hyperplasia (Improved from last ED treatment. Patient here to treat additional spots on face.).  She has an irritated tag like growth on her face that is bothersome Joesph July that she would like removed as well.   The following portions of the chart were reviewed this encounter and updated as appropriate:       Review of Systems:  No other skin or systemic complaints except as noted in HPI or Assessment and Plan.  Objective  Well appearing patient in no apparent distress; mood and affect are within normal limits.  A focused examination was performed including face. Relevant physical exam findings are noted in the Assessment and Plan.  Objective  Forehead, bil cheeks, nose: Yellow lobulated papules - numerous, of the bil cheeks, nose  Forehead improved when compared to photo with fewer lesions  Objective  Right Nasolabial: Erythematous keratotic papule.   Assessment & Plan  Sebaceous hyperplasia Forehead, bil cheeks, nose  Discussed cosmetic procedure (electrodesiccation), noncovered.  $60 for 1st lesion and $15 for each additional lesion if done on the same day.  Maximum charge $350.  One touch-up treatment included no charge. Discussed risks of treatment including dyspigmentation, small scar, and/or recurrence.     Forehead improved from previous ED treatment when photo compared. Bilateral cheeks and nose treated today with electrodesiccation procedure x approx 75  Destruction of lesion - Forehead, bil cheeks, nose Complexity: extensive   Destruction method: electrodesiccation and curettage   Destruction method comment:  Electrodesiccation only, not curretage Informed consent: discussed and consent obtained   Timeout:  patient name, date of birth, surgical site, and procedure verified Patient was prepped and draped in usual sterile fashion: patient was prepped with  isopropyl alcohol. Outcome: patient tolerated procedure well with no complications   Post-procedure details: wound care instructions given    Inflamed seborrheic keratosis Right Nasolabial  Destruction of lesion - Right Nasolabial  Destruction method: cryotherapy   Informed consent: discussed and consent obtained   Lesion destroyed using liquid nitrogen: Yes   Region frozen until ice ball extended beyond lesion: Yes   Outcome: patient tolerated procedure well with no complications   Post-procedure details: wound care instructions given    Return in about 2 months (around 03/18/2020) for f/u SH.   I, Cherlyn Labella, CMA, am acting as scribe for Willeen Niece, MD .  Documentation: I have reviewed the above documentation for accuracy and completeness, and I agree with the above.  Willeen Niece MD

## 2020-01-20 ENCOUNTER — Ambulatory Visit: Payer: BC Managed Care – PPO | Admitting: Psychology

## 2020-01-21 ENCOUNTER — Other Ambulatory Visit: Payer: Self-pay | Admitting: *Deleted

## 2020-01-21 MED ORDER — OZEMPIC (1 MG/DOSE) 4 MG/3ML ~~LOC~~ SOPN: PEN_INJECTOR | SUBCUTANEOUS | 3 refills | Status: DC

## 2020-01-23 ENCOUNTER — Other Ambulatory Visit: Payer: Self-pay | Admitting: Family Medicine

## 2020-01-24 ENCOUNTER — Other Ambulatory Visit: Payer: BC Managed Care – PPO

## 2020-01-24 ENCOUNTER — Other Ambulatory Visit: Payer: Self-pay | Admitting: Endocrinology

## 2020-01-24 DIAGNOSIS — H40053 Ocular hypertension, bilateral: Secondary | ICD-10-CM | POA: Diagnosis not present

## 2020-01-24 DIAGNOSIS — Z961 Presence of intraocular lens: Secondary | ICD-10-CM | POA: Diagnosis not present

## 2020-01-24 LAB — HM DIABETES EYE EXAM

## 2020-01-27 ENCOUNTER — Encounter: Payer: Self-pay | Admitting: Family Medicine

## 2020-01-28 ENCOUNTER — Other Ambulatory Visit: Payer: BC Managed Care – PPO

## 2020-01-28 ENCOUNTER — Telehealth: Payer: Self-pay | Admitting: Family Medicine

## 2020-01-28 NOTE — Telephone Encounter (Signed)
-----   Message from [name] sent at 01/28/2020  7:29 AM EST ----- Regarding: labs orders for today Lab orders, no f/u

## 2020-01-31 DIAGNOSIS — F900 Attention-deficit hyperactivity disorder, predominantly inattentive type: Secondary | ICD-10-CM | POA: Diagnosis not present

## 2020-01-31 DIAGNOSIS — F3111 Bipolar disorder, current episode manic without psychotic features, mild: Secondary | ICD-10-CM | POA: Diagnosis not present

## 2020-01-31 DIAGNOSIS — F3176 Bipolar disorder, in full remission, most recent episode depressed: Secondary | ICD-10-CM | POA: Diagnosis not present

## 2020-02-01 ENCOUNTER — Ambulatory Visit: Payer: BC Managed Care – PPO | Admitting: Endocrinology

## 2020-02-02 ENCOUNTER — Ambulatory Visit (INDEPENDENT_AMBULATORY_CARE_PROVIDER_SITE_OTHER): Payer: BC Managed Care – PPO | Admitting: Psychology

## 2020-02-02 DIAGNOSIS — F3131 Bipolar disorder, current episode depressed, mild: Secondary | ICD-10-CM

## 2020-02-07 ENCOUNTER — Ambulatory Visit (INDEPENDENT_AMBULATORY_CARE_PROVIDER_SITE_OTHER): Payer: BC Managed Care – PPO | Admitting: Neurology

## 2020-02-07 ENCOUNTER — Other Ambulatory Visit (INDEPENDENT_AMBULATORY_CARE_PROVIDER_SITE_OTHER): Payer: BC Managed Care – PPO

## 2020-02-07 ENCOUNTER — Encounter: Payer: Self-pay | Admitting: Neurology

## 2020-02-07 ENCOUNTER — Other Ambulatory Visit: Payer: Self-pay

## 2020-02-07 VITALS — BP 160/98 | HR 108 | Ht 66.0 in | Wt 205.0 lb

## 2020-02-07 DIAGNOSIS — E1165 Type 2 diabetes mellitus with hyperglycemia: Secondary | ICD-10-CM

## 2020-02-07 DIAGNOSIS — E782 Mixed hyperlipidemia: Secondary | ICD-10-CM

## 2020-02-07 DIAGNOSIS — R413 Other amnesia: Secondary | ICD-10-CM | POA: Diagnosis not present

## 2020-02-07 DIAGNOSIS — E890 Postprocedural hypothyroidism: Secondary | ICD-10-CM

## 2020-02-07 DIAGNOSIS — H8110 Benign paroxysmal vertigo, unspecified ear: Secondary | ICD-10-CM

## 2020-02-07 DIAGNOSIS — R42 Dizziness and giddiness: Secondary | ICD-10-CM | POA: Diagnosis not present

## 2020-02-07 DIAGNOSIS — Z794 Long term (current) use of insulin: Secondary | ICD-10-CM

## 2020-02-07 LAB — TSH: TSH: 8.5 u[IU]/mL — ABNORMAL HIGH (ref 0.35–4.50)

## 2020-02-07 LAB — BASIC METABOLIC PANEL
BUN: 16 mg/dL (ref 6–23)
CO2: 28 mEq/L (ref 19–32)
Calcium: 10 mg/dL (ref 8.4–10.5)
Chloride: 105 mEq/L (ref 96–112)
Creatinine, Ser: 0.73 mg/dL (ref 0.40–1.20)
GFR: 88.68 mL/min (ref >60.00)
Glucose, Bld: 158 mg/dL — ABNORMAL HIGH (ref 70–99)
Potassium: 4.2 mEq/L (ref 3.5–5.1)
Sodium: 142 mEq/L (ref 135–145)

## 2020-02-07 LAB — LDL CHOLESTEROL, DIRECT: Direct LDL: 125 mg/dL

## 2020-02-07 NOTE — Addendum Note (Signed)
Addended by: [name] on: 02/07/2020 11:31 AM   Modules accepted: Orders

## 2020-02-07 NOTE — Patient Instructions (Signed)
Let me review plan with Dr. Jannifer Franklin  Will update accordingly See you back in 6 months

## 2020-02-07 NOTE — Progress Notes (Signed)
I have read the note, and I agree with the clinical assessment and plan.  Janssen Zee K [place]   

## 2020-02-07 NOTE — Progress Notes (Addendum)
PATIENT: Lindsay Fry DOB: 27-Aug-1960  REASON FOR VISIT: follow up HISTORY FROM: patient  HISTORY OF PRESENT ILLNESS: Today 02/07/20 Lindsay Fry is a 60 year old female with history gait and only disorder, fibromyalgia, ulcerative colitis, migraine headache, bipolar disorder, diabetes, hypertension, anxiety, OCD behavior, ADD. MRI of the brain has been normal, no evidence of stroke event.  Has history of near syncopal events, US carotid showed no significant stenosis.  Seeing Dr. Prudencio Burly, is her eye doctor, seeing for double vision, since November; at the time stood up, fell, she took meclizine, cleared it up. Is taking meclizine 50 mg morning, then take 1/2 tablet if episode. Blurred vision is always in the left eye. No ptosis noted.   Sees psych, having trouble with mania, added another Tegretol, had to drop back to 300 mg due to double vision, had nausea/vomiting.  Started Tegretol in November, after initial dizziness, had to wean off lithium.  Vertigo is better, no headache, sometimes 1-2 times a week. Is going PT for vestibular rehab, has only been once. Just got appointment book organized. Will be going twice a week for 4 weeks.   Goes to Dr. Domingo Cocking for headache, no migraine headache in years, had 1 bad migraine in November 2021, treated with prednisone taper. Sees him annual visit.   Gait is good most of the time, sometimes leans to the left, a few falls due to turning too fast. Feels related to vertigo. Memory-still has word finding problems, MRI of the brain was normal in Feb 2021.   Main issue, double vision to the left eye, followed by vertigo and memory issues. She drove here today unaccompanied.  HISTORY 08/05/2019 SS: Lindsay Fry is a 60 year old female with history of gait and memory disorder.  MRI of the brain was normal, no evidence of stroke event.  Reports about 2 weeks ago, went to lunch with a friend, was standing waiting at salad bar, had to stand for longer than  normal time, says she started to feel "weird", dizzy, felt faint, her legs were weak and heavy, felt better after eating, but by the time they left, the feeling returned, stayed for the rest of the day. She didn't fall.  She took her blood pressure at home, was normal.  Did not check blood sugar.  No other episodes reported. Says over all stable, glad to be back doing her water walking. Has trouble with short-term memory.  Has history significant of obesity, fibromyalgia, ulcerative colitis, migraine headache, bipolar disorder, diabetes, hypertension, anxiety, OCD, ADD, and chronic back pain.  Says her diabetes has been fairly well controlled.  Around time of most recent episode, under a lot of stress with family.  Presents today for evaluation unaccompanied.   REVIEW OF SYSTEMS: Out of a complete 14 system review of symptoms, the patient complains only of the following symptoms, and all other reviewed systems are negative.  Dizziness, memory loss  ALLERGIES: Allergies  Allergen Reactions  . Ephedrine Other (See Comments)    Pt becomes hyper  . Aspartame And Phenylalanine Nausea Only  . Aspirin     REACTION: aggrivates colitis  . Crestor [Rosuvastatin]     REACTION: increased lfts  . Erythromycin     REACTION: GI upset  . Nsaids     REACTION: aggrivate colitis  . Oxycodone Other (See Comments)    Panic Attack    HOME MEDICATIONS: Outpatient Medications Prior to Visit  Medication Sig Dispense Refill  . acetaminophen (TYLENOL) 650 MG CR tablet  Take 1,300 mg by mouth 2 (two) times daily.    Marland Kitchen amantadine (SYMMETREL) 100 MG capsule Take 100 mg by mouth 2 (two) times daily.    Marland Kitchen atorvastatin (LIPITOR) 80 MG tablet TAKE 1 TABLET(80 MG) BY MOUTH DAILY 90 tablet 1  . BAYER MICROLET LANCETS lancets USE AS DIRECTED TO CHECK BLOOD SUGAR TWICE DAILY 200 each 2  . Bioflavonoid Products (VITAMIN C) CHEW Chew 1 tablet by mouth daily.    . Blood Glucose Monitoring Suppl (CONTOUR NEXT MONITOR) w/Device  KIT 1 each by Does not apply route in the morning and at bedtime. Use Contour Next meter to check blood sugar twice daily. 1 kit 0  . Calcium Carb-Cholecalciferol 600-800 MG-UNIT TABS Take 1 tablet by mouth daily.    . carbamazepine (TEGRETOL XR) 200 MG 12 hr tablet Take 300 mg by mouth at bedtime. 100 mg 3 tabs a daily at bedtime    . Cholecalciferol (VITAMIN D3) 125 MCG (5000 UT) CAPS Take 1 capsule by mouth daily.    . clopidogrel (PLAVIX) 75 MG tablet TAKE 1 TABLET(75 MG) BY MOUTH DAILY 90 tablet 0  . co-enzyme Q-10 50 MG capsule Take 50 mg by mouth daily.    . Cyanocobalamin (VITAMIN B-12 PO) 3000 mcg-- 2 gummies daily    . diazepam (VALIUM) 10 MG tablet Take 2.5-10 mg by mouth as needed for anxiety.    Marland Kitchen diltiazem (CARDIZEM) 30 MG tablet Take 1 tablet (30 mg) by mouth up to three times a day as needed for a blood pressure > 150 (top number) 90 tablet 1  . diltiazem (TIAZAC) 300 MG 24 hr capsule Take 1 capsule (300 mg total) by mouth daily. 90 capsule 0  . docusate sodium (COLACE) 100 MG capsule Take by mouth. 3 in the morning and 2 at bedtime    . ezetimibe (ZETIA) 10 MG tablet TAKE 1 TABLET(10 MG) BY MOUTH DAILY 90 tablet 3  . famotidine (PEPCID) 40 MG tablet Take 40 mg by mouth 2 (two) times daily.    Marland Kitchen gabapentin (NEURONTIN) 300 MG capsule TAKE 1 CAPSULE(300 MG) BY MOUTH THREE TIMES DAILY 90 capsule 5  . glucose blood (CONTOUR NEXT TEST) test strip Use as instructed 100 each 12  . insulin detemir (LEVEMIR FLEXTOUCH) 100 UNIT/ML FlexPen ADMINISTER 51 UNITS UNDER THE SKIN EVERY DAY IN THE EVENING 30 mL 2  . Insulin Pen Needle (B-D UF III MINI PEN NEEDLES) 31G X 5 MM MISC USE FOR INJECTIONS TWICE DAILY 100 each 12  . JARDIANCE 25 MG TABS tablet TAKE 1 TABLET(25 MG) BY MOUTH DAILY 30 tablet 2  . ketoconazole (NIZORAL) 2 % cream Apply to affected areas one to two times daily as needed. 60 g 2  . Lactobacillus-Inulin (CULTURELLE DIGESTIVE DAILY PO) Take 1 capsule by mouth daily.    Marland Kitchen  lamoTRIgine (LAMICTAL) 200 MG tablet Take 400 mg by mouth daily.    Marland Kitchen latanoprost (XALATAN) 0.005 % ophthalmic solution Place 1 drop into both eyes 2 (two) times a week.     Marland Kitchen LEVOXYL 125 MCG tablet Take 2 tablets (250 mcg total) by mouth daily before breakfast. 60 tablet 3  . linaclotide (LINZESS) 290 MCG CAPS capsule Take 1 capsule (290 mcg total) by mouth daily before breakfast. 30 capsule 6  . loperamide (IMODIUM A-D) 2 MG tablet Take 2 mg by mouth as needed for diarrhea or loose stools.    . meclizine (ANTIVERT) 25 MG tablet Take 25 mg by mouth 2 (two)  times daily.    . Melatonin 10 MG CAPS Take 1 capsule by mouth at bedtime.    . mesalamine (LIALDA) 1.2 g EC tablet TAKE 2 TABLETS BY MOUTH DAILY 60 tablet 2  . Multiple Vitamin (MULTIVITAMIN) tablet Take 1 tablet by mouth daily.    . Omega-3 Krill Oil 500 MG CAPS Take 500 mg by mouth.    . ondansetron (ZOFRAN-ODT) 4 MG disintegrating tablet DISSOLVE 1 TABLET(4 MG) ON THE TONGUE EVERY 6 HOURS AS NEEDED FOR NAUSEA OR VOMITING 90 tablet 1  . pantoprazole (PROTONIX) 40 MG tablet TAKE 1 TABLET(40 MG) BY MOUTH TWICE DAILY 60 tablet 6  . predniSONE (DELTASONE) 10 MG tablet Take 3 pills once daily by mouth for 3 days, then 2 pills once daily for 3 days, then 1 pill once daily for 3 days and then stop 18 tablet 0  . Probiotic Product (PROBIOTIC DAILY PO) Take by mouth.    . QUEtiapine (SEROQUEL XR) 400 MG 24 hr tablet Take 800 mg by mouth at bedtime.    . Semaglutide, 1 MG/DOSE, (OZEMPIC, 1 MG/DOSE,) 4 MG/3ML SOPN INJECT 1MG INTO THE SKIN ONCE A WEEK 3 mL 3  . Simethicone (GAS-X PO) Take 1 tablet by mouth 4 (four) times daily.    . valACYclovir (VALTREX) 1000 MG tablet TAKE 1 TABLET(1000 MG) BY MOUTH DAILY 30 tablet 3  . vitamin C (ASCORBIC ACID) 500 MG tablet Take 250 mg by mouth daily. Takes 2 a day    . VYVANSE 50 MG capsule Take 50 mg by mouth every morning.    Marland Kitchen VYVANSE 60 MG capsule Take 60 mg by mouth every morning.   0  . zaleplon (SONATA) 10  MG capsule Take 20 mg by mouth at bedtime as needed for sleep.    . nitroGLYCERIN (NITROSTAT) 0.4 MG SL tablet Place 1 tablet (0.4 mg total) under the tongue every 5 (five) minutes as needed for chest pain. (Patient not taking: Reported on 09/22/2019) 25 tablet 3  . senna-docusate (SENOKOT-S) 8.6-50 MG tablet Take 1-2 tablets by mouth at bedtime.     No facility-administered medications prior to visit.    PAST MEDICAL HISTORY: Past Medical History:  Diagnosis Date  . ADD (attention deficit disorder) 11/19/2012  . Allergic rhinitis, cause unspecified   . Anxiety state, unspecified   . Arthritis   . Asymptomatic postmenopausal status (age-related) (natural)   . Atypical hyperplasia of left breast 2000  . Atypical mole 03/16/2018   R paraspinal mid upper back, excision  . Atypical mole 02/17/2018   R spinal upper back, moderate  . Atypical mole 08/11/2017   L sternum, excision  . Atypical mole 05/27/2017   L post neck, moderate  . Benign neoplasm of other and unspecified site of the digestive system   . Bipolar disorder ([place])   . Breast cancer ([place])    Left Breast 29m invasive CA left uiq, dcis left uoq and a lot ADH, ALH in both breasts.  .Marland KitchenCAD (coronary artery disease)    a. 08/2014 NSTEMI/Cath: LM nl, LAD 50p, D1/D2 min irregs, LCX nl, OM1 40, LPDA nl, RI 99ost (2.056mvessel->med Rx), RCA nl, AM 60, nl EF; b. 11/2016 MV: EF 74%, no ischemia (performed 2/2 dyspnea & new inf TWI).  . Marland Kitchenarcinoma of left breast ([place]/19/2016  . Cataract   . Chronic diastolic CHF (congestive heart failure) ([place]   a. 08/2014 Echo: EF 60-65%, Gr 1 DD, nl LA.  . Marland Kitchenlotting disorder ([place]  on blood thiner, Plavix  . Cyst    on Achilles tendon  . Depression   . Diabetes mellitus ([place])    frank  . Dysuria   . Edema   . Family history of malignant neoplasm of gastrointestinal tract   . Fibromyalgia   . GERD (gastroesophageal reflux disease)   . Glaucoma    pre glaucoma, 05/14/2018 pt. denies glaucoma   . Headache    migraines  . Hiatal hernia   . History of alcoholism ([place])   . History of migraines   . History of ovarian cyst   . Hx of adenomatous colonic polyps   . Insomnia, unspecified   . Myocardial infarction ([place]) 09-04-14  . OCD (obsessive compulsive disorder)   . Osteopenia 09/25/2016   Femoral neck T -1.1  9/18  . Other screening mammogram   . Pure hypercholesterolemia   . Rosacea   . Subacute confusional state 11/19/2012  . Tubular adenoma of colon 01/10/11  . Ulcerative colitis, unspecified   . Unspecified asthma(493.90)   . Unspecified essential hypertension   . Unspecified hypothyroidism   . Unspecified vitamin D deficiency   . Vertigo     PAST SURGICAL HISTORY: Past Surgical History:  Procedure Laterality Date  . APPENDECTOMY    . AXILLARY SENTINEL NODE BIOPSY Left 05/23/2014   Procedure: AXILLARY SENTINEL NODE BIOPSY;  Surgeon: Christene Lye, MD;  Location: [facility];  Service: General;  Laterality: Left;  . BREAST BIOPSY  Aug 2000   on tamoxifen, atypical hyperplasia  . BREAST BIOPSY Left 04/2014  . BREAST SURGERY Left Aug 2000   lumpectomy/ Dr Sharlet [place]  . BREAST SURGERY Bilateral 05/23/14   Mastectomy  . BUNIONECTOMY Right   . CARDIAC CATHETERIZATION N/A 09/05/2014   Procedure: Left Heart Cath and Coronary Angiography;  Surgeon: Wellington Hampshire, MD;  Location: [facility];  Service: Cardiovascular;  Laterality: N/A;  . CARDIAC CATHETERIZATION  09-05-14  . CATARACT EXTRACTION W/ INTRAOCULAR LENS IMPLANT Bilateral   . CESAREAN SECTION  1996/1997   placenta previa, gest DM, pre-eclampsia  . CHOLECYSTECTOMY  1997   adhesions also  . COLONOSCOPY  01/2001   Ulcerative colitis  . COLONOSCOPY  04/2004   UC, polyp  . COLONOSCOPY  12/08   UC, no polyps  . DEXA  04/1999 and 2010   normal  . ESOPHAGOGASTRODUODENOSCOPY  09/2001   polyp  . exercise stress test  11/2003   negative  . FEMUR FRACTURE SURGERY Left   . MASTECTOMY Bilateral   . MECKEL  DIVERTICULUM EXCISION  1999  . NASAL SINUS SURGERY  05/1997  . nuclear stress test  06/2007   negative  . precancerous mole removed    . SIMPLE MASTECTOMY WITH AXILLARY SENTINEL NODE BIOPSY Bilateral 05/23/2014   Procedure: Bilateral simple mastectomy, left sentinel node biopsy ;  Surgeon: Christene Lye, MD;  Location: [facility];  Service: General;  Laterality: Bilateral;  . Sleep study  11/09   no apnea, but did snore (done by [facility])  . TONSILLECTOMY  1984  . TUBAL LIGATION    . WISDOM TOOTH EXTRACTION  1980's    FAMILY HISTORY: Family History  Problem Relation Age of Onset  . Coronary artery disease Father   . Colon cancer Father   . Alzheimer's disease Father   . Dementia Father   . Colon polyps Father   . Coronary artery disease Mother   . Hypertension Mother   . Osteoporosis Mother   .  Crohn's disease Mother        crohns colitis  . Breast cancer Other        great aunts  . Coronary artery disease Other        Uncle (also AAA)  . Diabetes Other        remote family history  . Stroke Cousin   . Esophageal cancer Neg Hx   . Rectal cancer Neg Hx   . Stomach cancer Neg Hx     SOCIAL HISTORY: Social History   Socioeconomic History  . Marital status: Married    Spouse name: alan  . Number of children: 2  . Years of education: college  . Highest education level: Not on file  Occupational History  . Occupation: unemployed  Tobacco Use  . Smoking status: Former Smoker    Packs/day: 0.25    Years: 5.00    Pack years: 1.25    Types: Cigarettes    Quit date: 01/14/1993    Years since quitting: 27.0  . Smokeless tobacco: Never Used  Vaping Use  . Vaping Use: Never used  Substance and Sexual Activity  . Alcohol use: No    Alcohol/week: 0.0 standard drinks    Comment: Recovered ETOH  . Drug use: No  . Sexual activity: Not Currently    Partners: Male  Other Topics Concern  . 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  Generalized: Well developed, in no acute distress  MMSE - Mini Mental State Exam 08/05/2019 02/05/2019  Not completed: - (No Data)  Orientation to time 5 5  Orientation to Place 5 5  Registration 3 3  Attention/ Calculation 5 3  Recall 3 2  Language- name 2 objects 2 2  Language- repeat 1 1  Language- follow 3 step command 3 3  Language- read & follow direction 1 1  Write a sentence 1 1  Copy design 0 1  Total score 29 27    Neurological examination  Mentation: Alert oriented to time, place, history taking. Follows all commands speech and language fluent Cranial nerve II-XII: Pupils were equal round reactive to light. Extraocular movements were full, visual field were full on confrontational test. Facial sensation and strength were normal. Head turning and shoulder shrug were normal and symmetric. No ptosis. Motor: The motor testing reveals 5 over 5 strength of all 4 extremities. Good symmetric motor tone is noted throughout.  Sensory: Sensory testing is intact to soft touch on all 4 extremities. No evidence of extinction is noted.  Coordination: Cerebellar testing reveals good finger-nose-finger and heel-to-shin bilaterally.  Gait and station: Gait is normal. Tandem gait is unsteady.  Reflexes: Deep tendon reflexes are symmetric and normal bilaterally.   DIAGNOSTIC DATA (LABS, IMAGING, TESTING) - I reviewed patient  records, labs, notes, testing and imaging  myself where available.  Lab Results  Component Value Date   WBC 8.9 08/30/2019   HGB 15.6 (H) 08/30/2019   HCT 46.7 (H) 08/30/2019   MCV 94.0 08/30/2019   PLT 348 08/30/2019      Component Value Date/Time   NA 143 12/13/2019 1023   K 4.3 12/13/2019 1023   CL 101 12/13/2019 1023   CO2 32 12/13/2019 1023   GLUCOSE 174 (H) 12/13/2019 1023   BUN 13 12/13/2019 1023   CREATININE 0.83 12/13/2019 1023   CALCIUM 10.5 12/13/2019 1023   PROT 6.4 09/06/2019 1050   ALBUMIN 4.3 09/06/2019 1050   AST 36 09/06/2019 1050   ALT 43 (H) 09/06/2019 1050   ALKPHOS 81 09/06/2019 1050   BILITOT 0.6 09/06/2019 1050   GFRNONAA >60 08/30/2019 1437   GFRAA >60 08/30/2019 1437   Lab Results  Component Value Date   CHOL 181 12/13/2019   HDL 50.20 12/13/2019   LDLCALC 74 02/05/2019   LDLDIRECT 109.0 12/13/2019   TRIG 216.0 (H) 12/13/2019   CHOLHDL 4 12/13/2019   Lab Results  Component Value Date   HGBA1C 7.3 (H) 12/13/2019   Lab Results  Component Value Date   VITAMINB12 600 12/16/2018   Lab Results  Component Value Date   TSH 12.51 (H) 12/13/2019   ASSESSMENT AND PLAN 60 y.o. year old female  has a past medical history of ADD (attention deficit disorder) (11/19/2012), Allergic rhinitis, cause unspecified, Anxiety state, unspecified, Arthritis, Asymptomatic postmenopausal status (age-related) (natural), Atypical hyperplasia of left breast (2000), Atypical mole (03/16/2018), Atypical mole (02/17/2018), Atypical mole (08/11/2017), Atypical mole (05/27/2017), Benign neoplasm of other and unspecified site of the digestive system, Bipolar disorder ([place]), Breast cancer ([place]), CAD (coronary artery disease), Carcinoma of left breast ([place]) (06/02/2014), Cataract, Chronic diastolic CHF (congestive heart failure) ([place]), Clotting disorder ([place]), Cyst, Depression, Diabetes mellitus ([place]), Dysuria, Edema, Family history of malignant neoplasm of gastrointestinal tract, Fibromyalgia, GERD (gastroesophageal reflux  disease), Glaucoma, Headache, Hiatal hernia, History of alcoholism ([place]), History of migraines, History of ovarian cyst, adenomatous colonic polyps, Insomnia, unspecified, Myocardial infarction ([place]) (09-04-14), OCD (obsessive compulsive disorder), Osteopenia (09/25/2016), Other screening mammogram, Pure hypercholesterolemia, Rosacea, Subacute confusional state (11/19/2012), Tubular adenoma of colon (01/10/11), Ulcerative colitis, unspecified, Unspecified asthma(493.90), Unspecified essential hypertension, Unspecified hypothyroidism, Unspecified vitamin D deficiency, and Vertigo. here with:  1.  Memory disturbance, word finding difficulty -In July MMSE 29/30 -Longstanding; neuropsychological evaluation in 2014, not consistent with neurodegenerative process, was consistent with ADHD with associated significant clinical depression and moderate to severe anxiety -Polypharmacy, about 40 daily medications  2.  Dizziness, vertigo -Starting vestibular rehab, on meclizine, 100% better with meclizine -Starts as double the to the left eye, then dizziness -No reported headache, migraines well controlled, sees Dr. Domingo Cocking -MRI of the brain was normal in February 2021 -Carotid ultrasound showed no significant stenosis August 2021 -EEG was normal in November 2014 -I don't think any further evaluation needs to be done, but will run by Dr. Jannifer Franklin see if double vision/dizziness needs further work-up, I'd like for her to get in vestibular rehab and give it a try -On carbamazepine 300 mg daily from psych, dizziness started before initiation -Follow-up 6 months or sooner if needed  Addendum 02/14/20 SS: I called to get more information about the dizziness in left eye, goes away when covering the left eye, hasn't specifically tried to cover the right eye, but thinks it is still there. Seems more monocular diplopia. Psychiatry did  just stop the Tegretol and switch to [employer]. She'll let me know how symptoms do.  I spent  30 minutes of face-to-face and non-face-to-face time with patient.  This included previsit chart review, lab review, study review, order entry, electronic health record documentation, patient education.  Butler Denmark, AGNP-C, DNP 02/07/2020, 2:19 PM [facility] [address] [place], [place] [address] [phone]

## 2020-02-08 ENCOUNTER — Encounter: Payer: Self-pay | Admitting: Endocrinology

## 2020-02-08 ENCOUNTER — Other Ambulatory Visit: Payer: Self-pay

## 2020-02-08 ENCOUNTER — Ambulatory Visit (INDEPENDENT_AMBULATORY_CARE_PROVIDER_SITE_OTHER): Payer: BC Managed Care – PPO | Admitting: Endocrinology

## 2020-02-08 VITALS — BP 130/84 | HR 108 | Ht 65.5 in | Wt 205.0 lb

## 2020-02-08 DIAGNOSIS — Z794 Long term (current) use of insulin: Secondary | ICD-10-CM | POA: Diagnosis not present

## 2020-02-08 DIAGNOSIS — E890 Postprocedural hypothyroidism: Secondary | ICD-10-CM | POA: Diagnosis not present

## 2020-02-08 DIAGNOSIS — E1165 Type 2 diabetes mellitus with hyperglycemia: Secondary | ICD-10-CM

## 2020-02-08 DIAGNOSIS — E782 Mixed hyperlipidemia: Secondary | ICD-10-CM | POA: Diagnosis not present

## 2020-02-08 NOTE — Progress Notes (Signed)
Patient ID: Lindsay Fry, female   DOB: 1960-05-05, 60 y.o.   MRN: [id number]   Reason for Appointment:   followup visit  History of Present Illness:  DIABETES:  Prior history: With her high A1c of 6.7 in 03/2014 she had an abnormal glucose tolerance test indicating diabetes and 1 hour glucose of 300 She was started on metformin; however even with 1500 mg of metformin ER  blood sugars were not controlled especially fasting Subsequently with taking Janumet XR 100/1000 daily her blood sugars were much better Because of her weight gain and A1c going up to 7.3 along with occasional readings over 200 she was started on Victoza in 11/16 She has had progressive hyperglycemia previously and A1c was up to 8.6% in 4/17  She was started on Levemir insulin in 08/2015 because of progressive rise in blood sugars and inability to control with 3 other agents   Non-insulin hypoglycemic drugs: Ozempic 1 mg weekly; metformin ER 2000 mg daily, Jardiance 25 mg daily  INSULIN regimen: Levemir 60 units at bedtime  A1c is last 7.3 compared to 6.4  Current management, blood sugar patterns and problems identified:  She has less higher blood sugars which on the previous visit were related to eating sweets like donuts and drinking regular Sprite  She thinks her diet has been generally better and only occasionally will have some sweets  With this her morning sugars are only rarely increased  As usual forgets to check her readings after meals  Again has not done any exercise, usually will not go to the swimming pool unless it is warm  Weight has not gone up  Is quite regular with all her medications and renal function is stable with Jardiance  GLUCOSE readings from review of the CONTOUR monitor download and averages for 30 days:   PRE-MEAL Fasting Lunch Dinner Bedtime Overall  Glucose range:  109-171      Mean/median: 136   128  135   POST-MEAL PC Breakfast PC Lunch PC Dinner  Glucose  range:   127   Mean/median:      Prior:   PRE-MEAL Fasting Lunch Dinner Bedtime Overall  Glucose range:  57-206    157, 230   Mean/median:  171     181   POST-MEAL PC Breakfast PC Lunch PC Dinner  Glucose range:   206  228  Mean/median:        Weight history:  Wt Readings from Last 3 Encounters:  02/08/20 205 lb (93 kg)  02/07/20 205 lb (93 kg)  12/16/19 207 lb (93.9 kg)   Previous consultation with dietitian: 2016    LABS:  Lab Results  Component Value Date   HGBA1C 7.3 (H) 12/13/2019   HGBA1C 6.4 09/06/2019   HGBA1C 5.9 06/03/2019   Lab Results  Component Value Date   MICROALBUR <0.7 09/06/2019   [place] 74 02/05/2019   CREATININE 0.73 02/07/2020   HYPOTHYROIDISM: This was first diagnosed in 1992 after her treatment for Graves' disease with I-131 She has been on relatively large doses of thyroxine supplements She was on 224 mcg since 11/13 and her TSH was normal in 3/14 Subjectively difficult to assess her thyroid since she tends to have fatigue chronically.  In 2014 her dose was reduced to 200 mcg daily and her dose has been fluctuating since then She had required periodic increase in dosage including in 05/2014 when her TSH was 20  She is  on 250 g dosage, on the brand  name LEVOXYL The dose was increased on her last visit  No unusual fatigue  She has been regular with the Levoxyl every day before breakfast on empty stomach but ran out 2 days before her labs were done Levoxyl is better covered than Synthroid by insurance         TSH is   Lab Results  Component Value Date   TSH 8.50 (H) 02/07/2020   TSH 12.51 (H) 12/13/2019   TSH 6.58 (H) 09/06/2019   FREET4 0.44 (L) 12/13/2019   FREET4 0.68 09/06/2019   FREET4 0.73 06/03/2019     Lab on 02/07/2020  Component Date Value Ref Range Status  . Direct LDL 02/07/2020 125.0  mg/dL Final   Optimal:  <100 mg/dLNear or Above Optimal:  100-129 mg/dLBorderline High:  130-159 mg/dLHigh:  160-189 mg/dLVery  High:  >190 mg/dL  . TSH 02/07/2020 8.50* 0.35 - 4.50 uIU/mL Final  . Sodium 02/07/2020 142  135 - 145 mEq/L Final  . Potassium 02/07/2020 4.2  3.5 - 5.1 mEq/L Final  . Chloride 02/07/2020 105  96 - 112 mEq/L Final  . CO2 02/07/2020 28  19 - 32 mEq/L Final  . Glucose, Bld 02/07/2020 158* 70 - 99 mg/dL Final  . BUN 02/07/2020 16  6 - 23 mg/dL Final  . Creatinine, Ser 02/07/2020 0.73  0.40 - 1.20 mg/dL Final  . GFR 02/07/2020 88.68  >60.00 mL/min Final   Calculated using the CKD-EPI Creatinine Equation (2021)  . Calcium 02/07/2020 10.0  8.4 - 10.5 mg/dL Final    Allergies as of 02/08/2020      Reactions   Ephedrine Other (See Comments)   Pt becomes hyper   Aspartame And Phenylalanine Nausea Only   Aspirin    REACTION: aggrivates colitis   Crestor [rosuvastatin]    REACTION: increased lfts   Erythromycin    REACTION: GI upset   Nsaids    REACTION: aggrivate colitis   Oxycodone Other (See Comments)   Panic Attack      Medication List       Accurate as of February 08, 2020 11:59 PM. If you have any questions, ask your nurse or doctor.        acetaminophen 650 MG CR tablet Commonly known as: TYLENOL Take 1,300 mg by mouth 2 (two) times daily.   amantadine 100 MG capsule Commonly known as: SYMMETREL Take 100 mg by mouth 2 (two) times daily.   atorvastatin 80 MG tablet Commonly known as: LIPITOR TAKE 1 TABLET(80 MG) BY MOUTH DAILY   B-D UF III MINI PEN NEEDLES 31G X 5 MM Misc Generic drug: Insulin Pen Needle USE FOR INJECTIONS TWICE DAILY   Bayer Microlet Lancets lancets USE AS DIRECTED TO CHECK BLOOD SUGAR TWICE DAILY   Calcium Carb-Cholecalciferol 600-800 MG-UNIT Tabs Take 1 tablet by mouth daily.   carbamazepine 200 MG 12 hr tablet Commonly known as: TEGRETOL XR Take 300 mg by mouth at bedtime. 100 mg 3 tabs a daily at bedtime   clopidogrel 75 MG tablet Commonly known as: PLAVIX TAKE 1 TABLET(75 MG) BY MOUTH DAILY   co-enzyme Q-10 50 MG capsule Take 50 mg  by mouth daily.   Contour Next Monitor w/Device Kit 1 each by Does not apply route in the morning and at bedtime. Use Contour Next meter to check blood sugar twice daily.   Contour Next Test test strip Generic drug: glucose blood Use as instructed   CULTURELLE DIGESTIVE DAILY PO Take 1 capsule by mouth daily.  diazepam 10 MG tablet Commonly known as: VALIUM Take 2.5-10 mg by mouth as needed for anxiety.   diltiazem 30 MG tablet Commonly known as: CARDIZEM Take 1 tablet (30 mg) by mouth up to three times a day as needed for a blood pressure > 150 (top number)   diltiazem 300 MG 24 hr capsule Commonly known as: TIAZAC Take 1 capsule (300 mg total) by mouth daily.   docusate sodium 100 MG capsule Commonly known as: COLACE Take by mouth. 3 in the morning and 2 at bedtime   ezetimibe 10 MG tablet Commonly known as: ZETIA TAKE 1 TABLET(10 MG) BY MOUTH DAILY   famotidine 40 MG tablet Commonly known as: PEPCID Take 40 mg by mouth 2 (two) times daily.   gabapentin 300 MG capsule Commonly known as: NEURONTIN TAKE 1 CAPSULE(300 MG) BY MOUTH THREE TIMES DAILY   GAS-X PO Take 1 tablet by mouth 4 (four) times daily.   Jardiance 25 MG Tabs tablet Generic drug: empagliflozin TAKE 1 TABLET(25 MG) BY MOUTH DAILY   ketoconazole 2 % cream Commonly known as: NIZORAL Apply to affected areas one to two times daily as needed.   lamoTRIgine 200 MG tablet Commonly known as: LAMICTAL Take 400 mg by mouth daily.   latanoprost 0.005 % ophthalmic solution Commonly known as: XALATAN Place 1 drop into both eyes 2 (two) times a week.   Levemir FlexTouch 100 UNIT/ML FlexPen Generic drug: insulin detemir ADMINISTER 51 UNITS UNDER THE SKIN EVERY DAY IN THE EVENING What changed:   how much to take  additional instructions   Levoxyl 125 MCG tablet Generic drug: levothyroxine Take 2 tablets (250 mcg total) by mouth daily before breakfast.   linaclotide 290 MCG Caps capsule Commonly  known as: LINZESS Take 1 capsule (290 mcg total) by mouth daily before breakfast.   loperamide 2 MG tablet Commonly known as: IMODIUM A-D Take 2 mg by mouth as needed for diarrhea or loose stools.   meclizine 25 MG tablet Commonly known as: ANTIVERT Take 25 mg by mouth 2 (two) times daily.   Melatonin 10 MG Caps Take 1 capsule by mouth at bedtime.   mesalamine 1.2 g EC tablet Commonly known as: LIALDA TAKE 2 TABLETS BY MOUTH DAILY   multivitamin tablet Take 1 tablet by mouth daily.   nitroGLYCERIN 0.4 MG SL tablet Commonly known as: NITROSTAT Place 1 tablet (0.4 mg total) under the tongue every 5 (five) minutes as needed for chest pain.   Omega-3 Krill Oil 500 MG Caps Take 500 mg by mouth.   ondansetron 4 MG disintegrating tablet Commonly known as: ZOFRAN-ODT DISSOLVE 1 TABLET(4 MG) ON THE TONGUE EVERY 6 HOURS AS NEEDED FOR NAUSEA OR VOMITING   Ozempic (1 MG/DOSE) 4 MG/3ML Sopn Generic drug: Semaglutide (1 MG/DOSE) INJECT $RemoveBeforeD'1MG'SGEHDKCggnkrct$  INTO THE SKIN ONCE A WEEK   pantoprazole 40 MG tablet Commonly known as: PROTONIX TAKE 1 TABLET(40 MG) BY MOUTH TWICE DAILY   predniSONE 10 MG tablet Commonly known as: DELTASONE Take 3 pills once daily by mouth for 3 days, then 2 pills once daily for 3 days, then 1 pill once daily for 3 days and then stop   PROBIOTIC DAILY PO Take by mouth.   QUEtiapine 400 MG 24 hr tablet Commonly known as: SEROQUEL XR Take 800 mg by mouth at bedtime.   valACYclovir 1000 MG tablet Commonly known as: VALTREX TAKE 1 TABLET(1000 MG) BY MOUTH DAILY   VITAMIN B-12 PO 3000 mcg-- 2 gummies daily   vitamin  C 500 MG tablet Commonly known as: ASCORBIC ACID Take 250 mg by mouth daily. Takes 2 a day   Vitamin C Chew Chew 1 tablet by mouth daily.   Vitamin D3 125 MCG (5000 UT) Caps Take 1 capsule by mouth daily.   Vyvanse 60 MG capsule Generic drug: lisdexamfetamine Take 60 mg by mouth every morning.   Vyvanse 50 MG capsule Generic drug:  lisdexamfetamine Take 45 mg by mouth every morning.   zaleplon 10 MG capsule Commonly known as: SONATA Take 20 mg by mouth at bedtime as needed for sleep.       Allergies:  Allergies  Allergen Reactions  . Ephedrine Other (See Comments)    Pt becomes hyper  . Aspartame And Phenylalanine Nausea Only  . Aspirin     REACTION: aggrivates colitis  . Crestor [Rosuvastatin]     REACTION: increased lfts  . Erythromycin     REACTION: GI upset  . Nsaids     REACTION: aggrivate colitis  . Oxycodone Other (See Comments)    Panic Attack    Past Medical History:  Diagnosis Date  . ADD (attention deficit disorder) 11/19/2012  . Allergic rhinitis, cause unspecified   . Anxiety state, unspecified   . Arthritis   . Asymptomatic postmenopausal status (age-related) (natural)   . Atypical hyperplasia of left breast 2000  . Atypical mole 03/16/2018   R paraspinal mid upper back, excision  . Atypical mole 02/17/2018   R spinal upper back, moderate  . Atypical mole 08/11/2017   L sternum, excision  . Atypical mole 05/27/2017   L post neck, moderate  . Benign neoplasm of other and unspecified site of the digestive system   . Bipolar disorder ([place])   . Breast cancer ([place])    Left Breast 84mm invasive CA left uiq, dcis left uoq and a lot ADH, ALH in both breasts.  Marland Kitchen CAD (coronary artery disease)    a. 08/2014 NSTEMI/Cath: LM nl, LAD 50p, D1/D2 min irregs, LCX nl, OM1 40, LPDA nl, RI 99ost (2.26mm vessel->med Rx), RCA nl, AM 60, nl EF; b. 11/2016 MV: EF 74%, no ischemia (performed 2/2 dyspnea & new inf TWI).  Marland Kitchen Carcinoma of left breast ([place]) 06/02/2014  . Cataract   . Chronic diastolic CHF (congestive heart failure) ([place])    a. 08/2014 Echo: EF 60-65%, Gr 1 DD, nl LA.  Marland Kitchen Clotting disorder ([place])    on blood thiner, Plavix  . Cyst    on Achilles tendon  . Depression   . Diabetes mellitus ([place])    frank  . Dysuria   . Edema   . Family history of malignant neoplasm of gastrointestinal tract    . Fibromyalgia   . GERD (gastroesophageal reflux disease)   . Glaucoma    pre glaucoma, 05/14/2018 pt. denies glaucoma  . Headache    migraines  . Hiatal hernia   . History of alcoholism ([place])   . History of migraines   . History of ovarian cyst   . Hx of adenomatous colonic polyps   . Insomnia, unspecified   . Myocardial infarction ([place]) 09-04-14  . OCD (obsessive compulsive disorder)   . Osteopenia 09/25/2016   Femoral neck T -1.1  9/18  . Other screening mammogram   . Pure hypercholesterolemia   . Rosacea   . Subacute confusional state 11/19/2012  . Tubular adenoma of colon 01/10/11  . Ulcerative colitis, unspecified   . Unspecified asthma(493.90)   . Unspecified essential hypertension   .  Unspecified hypothyroidism   . Unspecified vitamin D deficiency   . Vertigo     Past Surgical History:  Procedure Laterality Date  . APPENDECTOMY    . AXILLARY SENTINEL NODE BIOPSY Left 05/23/2014   Procedure: AXILLARY SENTINEL NODE BIOPSY;  Surgeon: Christene Lye, MD;  Location: [facility] ORS;  Service: General;  Laterality: Left;  . BREAST BIOPSY  Aug 2000   on tamoxifen, atypical hyperplasia  . BREAST BIOPSY Left 04/2014  . BREAST SURGERY Left Aug 2000   lumpectomy/ Dr Sharlet Salina  . BREAST SURGERY Bilateral 05/23/14   Mastectomy  . BUNIONECTOMY Right   . CARDIAC CATHETERIZATION N/A 09/05/2014   Procedure: Left Heart Cath and Coronary Angiography;  Surgeon: Wellington Hampshire, MD;  Location: [facility];  Service: Cardiovascular;  Laterality: N/A;  . CARDIAC CATHETERIZATION  09-05-14  . CATARACT EXTRACTION W/ INTRAOCULAR LENS IMPLANT Bilateral   . CESAREAN SECTION  1996/1997   placenta previa, gest DM, pre-eclampsia  . CHOLECYSTECTOMY  1997   adhesions also  . COLONOSCOPY  01/2001   Ulcerative colitis  . COLONOSCOPY  04/2004   UC, polyp  . COLONOSCOPY  12/08   UC, no polyps  . DEXA  04/1999 and 2010   normal  . ESOPHAGOGASTRODUODENOSCOPY  09/2001   polyp  . exercise  stress test  11/2003   negative  . FEMUR FRACTURE SURGERY Left   . MASTECTOMY Bilateral   . MECKEL DIVERTICULUM EXCISION  1999  . NASAL SINUS SURGERY  05/1997  . nuclear stress test  06/2007   negative  . precancerous mole removed    . SIMPLE MASTECTOMY WITH AXILLARY SENTINEL NODE BIOPSY Bilateral 05/23/2014   Procedure: Bilateral simple mastectomy, left sentinel node biopsy ;  Surgeon: Christene Lye, MD;  Location: [facility] ORS;  Service: General;  Laterality: Bilateral;  . Sleep study  11/09   no apnea, but did snore (done by [facility])  . TONSILLECTOMY  1984  . TUBAL LIGATION    . WISDOM TOOTH EXTRACTION  1980's    Family History  Problem Relation Age of Onset  . Coronary artery disease Father   . Colon cancer Father   . Alzheimer's disease Father   . Dementia Father   . Colon polyps Father   . Coronary artery disease Mother   . Hypertension Mother   . Osteoporosis Mother   . Crohn's disease Mother        crohns colitis  . Breast cancer Other        great aunts  . Coronary artery disease Other        Uncle (also AAA)  . Diabetes Other        remote family history  . Stroke Cousin   . Esophageal cancer Neg Hx   . Rectal cancer Neg Hx   . Stomach cancer Neg Hx     Social History:  reports that she quit smoking about 27 years ago. Her smoking use included cigarettes. She has a 1.25 pack-year smoking history. She has never used smokeless tobacco. She reports that she does not drink alcohol and does not use drugs.  REVIEW Of SYSTEMS:   HYPERCALCEMIA: Her calcium has been previously high PTH in the past has been borderline for hyperparathyroidism and is last below 20  She is taking OTC vitamin D3, ? 2000 units and has been on calcium recommended by her oncologist Vitamin D level therapeutic  Bone density in 9/15 shows T score -1.1 at the hip  Lab Results  Component Value Date   PTH 18 10/25/2016   CALCIUM 10.0 02/07/2020   PHOS 2.3 01/29/2007     Lab  Results  Component Value Date   CALCIUM 10.0 02/07/2020   PHOS 2.3 01/29/2007   Lab Results  Component Value Date   VD25OH 41.98 06/03/2019   VD25OH 39.94 04/21/2019   VD25OH 38.76 09/28/2018     HYPERTENSION: Blood pressure is controlled, managed by cardiologist.   on Bystolic twice a day and diltiazem   Lab Results  Component Value Date   CREATININE 0.73 02/07/2020   BUN 16 02/07/2020   NA 142 02/07/2020   K 4.2 02/07/2020   CL 105 02/07/2020   CO2 28 02/07/2020    History of hypercholesterolemia: Management by cardiologist and has been on 80 mg atorvastatin since her MI She is also taking Zetia that was added subsequently However LDL continues to increase and she thinks he is taking her medications regularly    Lab Results  Component Value Date   CHOL 181 12/13/2019   CHOL 154 09/06/2019   CHOL 298 (H) 06/03/2019   Lab Results  Component Value Date   HDL 50.20 12/13/2019   HDL 41.60 09/06/2019   HDL 53.20 06/03/2019   Lab Results  Component Value Date   LDLCALC 74 02/05/2019   LDLCALC 79 09/28/2018   [place] 98 03/31/2018   Lab Results  Component Value Date   TRIG 216.0 (H) 12/13/2019   TRIG 209.0 (H) 09/06/2019   TRIG 362.0 (H) 06/03/2019   Lab Results  Component Value Date   CHOLHDL 4 12/13/2019   CHOLHDL 4 09/06/2019   CHOLHDL 6 06/03/2019   Lab Results  Component Value Date   LDLDIRECT 125.0 02/07/2020   LDLDIRECT 109.0 12/13/2019   LDLDIRECT 86.0 09/06/2019     Likely fatty liver: Liver functions show variable results   Lab Results  Component Value Date   ALT 43 (H) 09/06/2019       Examination:   BP 130/84   Pulse (!) 108   Ht 5' 5.5" (1.664 m)   Wt 205 lb (93 kg)   LMP 08/23/2006 (Approximate) Comment: tubal ligation  SpO2 92%   BMI 33.59 kg/m       Assessments   DIABETES with obesity:  See history of present illness for evaluation of  current management, blood sugar patterns and problems identified  Her A1c  is on the last visit higher than usual at 7.3, previously was 6.4  She is on a regimen of basal insulin, Jardiance and Ozempic 1 mg With increasing her basal insulin and better diet her fasting readings are generally better Currently no labs available to objectively assess her blood sugars Lab glucose 158 and still may not have consistently ideal fasting readings  She does need to start exercise along with consistent diet also Also needs to monitor more readings after meals and at bedtime For now we will continue 60 units of Levemir  Hypothyroidism, post ablative:  TSH is now  increased but likely from missing 2 tablets before her labs She will get her labs drawn by her hematologist next month Continue 250 mg of LEVOXYL  LIPIDS: She thinks she is taking her Lipitor regularly However even with adding Zetia her LDL is 125 now Considering her risk factors and need for aggressive control will have her switch from Zetia to [place]  Follow-up in about 2 months  Patient Instructions  Check blood sugars on waking up days a  week  Also check blood sugars about 2 hours after meals and do this after different meals by rotation  Recommended blood sugar levels on waking up are 90-130 and about 2 hours after meal is 130-160  Please bring your blood sugar monitor to each visit, thank you       .  Elayne Snare 02/09/2020, 8:19 AM   Note: This office note was prepared with Dragon voice recognition system technology. Any transcriptional errors that result from this process are unintentional.

## 2020-02-08 NOTE — Patient Instructions (Signed)
Check blood sugars on waking up days a week  Also check blood sugars about 2 hours after meals and do this after different meals by rotation  Recommended blood sugar levels on waking up are 90-130 and about 2 hours after meal is 130-160  Please bring your blood sugar monitor to each visit, thank you   

## 2020-02-09 LAB — FRUCTOSAMINE: Fructosamine: 260 umol/L (ref 205–285)

## 2020-02-09 MED ORDER — REPATHA 140 MG/ML ~~LOC~~ SOSY
PREFILLED_SYRINGE | SUBCUTANEOUS | 3 refills | Status: DC
Start: 2020-02-09 — End: 2020-04-03

## 2020-02-14 DIAGNOSIS — Z20822 Contact with and (suspected) exposure to covid-19: Secondary | ICD-10-CM | POA: Diagnosis not present

## 2020-02-14 DIAGNOSIS — Z03818 Encounter for observation for suspected exposure to other biological agents ruled out: Secondary | ICD-10-CM | POA: Diagnosis not present

## 2020-02-15 ENCOUNTER — Telehealth: Payer: Self-pay | Admitting: Endocrinology

## 2020-02-15 NOTE — Telephone Encounter (Signed)
Yes, it's been denied and an appeal has been started.

## 2020-02-15 NOTE — Telephone Encounter (Signed)
Pharmacy called asking if we ever got the PA to start for the patient's [place]?

## 2020-02-17 DIAGNOSIS — Z03818 Encounter for observation for suspected exposure to other biological agents ruled out: Secondary | ICD-10-CM | POA: Diagnosis not present

## 2020-02-17 DIAGNOSIS — Z20822 Contact with and (suspected) exposure to covid-19: Secondary | ICD-10-CM | POA: Diagnosis not present

## 2020-02-18 ENCOUNTER — Encounter: Payer: Self-pay | Admitting: Family Medicine

## 2020-02-18 DIAGNOSIS — Z20822 Contact with and (suspected) exposure to covid-19: Secondary | ICD-10-CM | POA: Diagnosis not present

## 2020-02-18 DIAGNOSIS — Z03818 Encounter for observation for suspected exposure to other biological agents ruled out: Secondary | ICD-10-CM | POA: Diagnosis not present

## 2020-02-22 ENCOUNTER — Other Ambulatory Visit: Payer: BC Managed Care – PPO

## 2020-02-22 DIAGNOSIS — Z20822 Contact with and (suspected) exposure to covid-19: Secondary | ICD-10-CM | POA: Diagnosis not present

## 2020-02-22 DIAGNOSIS — Z03818 Encounter for observation for suspected exposure to other biological agents ruled out: Secondary | ICD-10-CM | POA: Diagnosis not present

## 2020-02-28 DIAGNOSIS — Z20822 Contact with and (suspected) exposure to covid-19: Secondary | ICD-10-CM | POA: Diagnosis not present

## 2020-02-28 DIAGNOSIS — Z03818 Encounter for observation for suspected exposure to other biological agents ruled out: Secondary | ICD-10-CM | POA: Diagnosis not present

## 2020-03-01 ENCOUNTER — Emergency Department (HOSPITAL_COMMUNITY)
Admission: EM | Admit: 2020-03-01 | Discharge: 2020-03-02 | Disposition: A | Discharge status: Home / Self Care | Payer: BC Managed Care – PPO | Attending: Emergency Medicine | Admitting: Emergency Medicine

## 2020-03-01 ENCOUNTER — Emergency Department (HOSPITAL_COMMUNITY): Payer: BC Managed Care – PPO

## 2020-03-01 ENCOUNTER — Inpatient Hospital Stay: Payer: BC Managed Care – PPO

## 2020-03-01 ENCOUNTER — Telehealth: Payer: Self-pay | Admitting: Internal Medicine

## 2020-03-01 ENCOUNTER — Other Ambulatory Visit: Payer: Self-pay

## 2020-03-01 ENCOUNTER — Telehealth: Payer: Self-pay

## 2020-03-01 ENCOUNTER — Inpatient Hospital Stay: Payer: BC Managed Care – PPO | Admitting: Internal Medicine

## 2020-03-01 ENCOUNTER — Telehealth: Payer: BC Managed Care – PPO | Admitting: Family Medicine

## 2020-03-01 ENCOUNTER — Encounter (HOSPITAL_COMMUNITY): Payer: Self-pay | Admitting: Emergency Medicine

## 2020-03-01 DIAGNOSIS — I517 Cardiomegaly: Secondary | ICD-10-CM | POA: Diagnosis not present

## 2020-03-01 DIAGNOSIS — U071 COVID-19: Secondary | ICD-10-CM | POA: Diagnosis not present

## 2020-03-01 DIAGNOSIS — R079 Chest pain, unspecified: Secondary | ICD-10-CM | POA: Diagnosis not present

## 2020-03-01 DIAGNOSIS — Z5321 Procedure and treatment not carried out due to patient leaving prior to being seen by health care provider: Secondary | ICD-10-CM | POA: Insufficient documentation

## 2020-03-01 DIAGNOSIS — R55 Syncope and collapse: Secondary | ICD-10-CM | POA: Diagnosis not present

## 2020-03-01 LAB — BASIC METABOLIC PANEL
Anion gap: 13 (ref 5–15)
BUN: 12 mg/dL (ref 8–23)
CO2: 25 mmol/L (ref 22–32)
Calcium: 9.3 mg/dL (ref 8.9–10.3)
Chloride: 103 mmol/L (ref 98–111)
Creatinine, Ser: 0.78 mg/dL (ref 0.44–1.00)
GFR, Estimated: >60 mL/min (ref >60)
Glucose, Bld: 103 mg/dL — ABNORMAL HIGH (ref 70–99)
Potassium: 3 mmol/L — ABNORMAL LOW (ref 3.5–5.1)
Sodium: 141 mmol/L (ref 135–145)

## 2020-03-01 LAB — CBC
HCT: 46.1 % — ABNORMAL HIGH (ref 36.0–46.0)
Hemoglobin: 14.8 g/dL (ref 12.0–15.0)
MCH: 29.9 pg (ref 26.0–34.0)
MCHC: 32.1 g/dL (ref 30.0–36.0)
MCV: 93.1 fL (ref 80.0–100.0)
Platelets: 371 10*3/uL (ref 150–400)
RBC: 4.95 MIL/uL (ref 3.87–5.11)
RDW: 13.4 % (ref 11.5–15.5)
WBC: 7.1 10*3/uL (ref 4.0–10.5)
nRBC: 0 % (ref 0.0–0.2)

## 2020-03-01 LAB — TROPONIN I (HIGH SENSITIVITY): Troponin I (High Sensitivity): 8 ng/L (ref <18)

## 2020-03-01 NOTE — Telephone Encounter (Signed)
Pt called to report she has tested pos for COVID and needs to cancel her appts today. She stated that she would call back to r/s as she is unwell and unable to as of right now.

## 2020-03-01 NOTE — Telephone Encounter (Signed)
Aware, will watch for correspondence

## 2020-03-01 NOTE — ED Triage Notes (Signed)
Pt arrives to ED with c/c of near syncope on Sunday recent covid on 2/4 tested + again on Monday. She arrives today because last night on her home pulse ox her hr was reading 30. She is still feeling faint.

## 2020-03-01 NOTE — Telephone Encounter (Signed)
Called and spoke with patients husband (ok per DPR) who stated that they are on their way to the ED. Patients husband stated that for the past two days she has had low blood pressure and decreased heart rate. Instructed patients husband to call with updates and that we will check on the patient later.

## 2020-03-01 NOTE — Telephone Encounter (Signed)
[facility] - Client TELEPHONE ADVICE RECORD [facility] Patient Name: Lindsay Fry Gender: Female DOB: 12/29/1960 Age: 60 Y 24 M 80 D Return Phone Number: [phone] (Primary), [phone] (Secondary) Address: City/State/Zip: [place] [address] Client [facility] - Client Client Site [place] MD Contact Type Call Who Is Calling Patient / Member / Family / Caregiver Call Type Triage / Clinical Relationship To Patient Self Return Phone Number [phone] (Primary) Chief Complaint Cough Reason for Call Symptomatic / Request for [place] states that she is coughing and wants to know what to do. She has a virtual appointment for today at [facility] List[facility] [place] Translation No Nurse Assessment Nurse: Jimmye Norman, RN, [place] Date/Time (Eastern Time): 03/01/2020 9:47:57 AM Confirm and document reason for call. If symptomatic, describe symptoms. ---Caller states that she is coughing and wants to know what to do. She has a virtual appointment for today at 1045am. States she tested positive for covid on 2/4. States Sunday was day 10, she felt fine, but then she started feeling dizziness, states it did pass and she felt fine. States yesterday she noticed with exertion it was harder for her to catch her breath, and she feels dizzy, it is persistent today when she is ambulating. pulse ox is 97% right now, heart rate is 47. Does the patient have any new or worsening symptoms? ---Yes Will a triage be completed? ---Yes Related visit to physician within the last 2 weeks? ---No Does the PT have any chronic conditions? (i.e. diabetes, asthma, this includes High risk factors for pregnancy, etc.) ---Yes List chronic conditions. ---hypertension, diabetes, bipolar, ADD, fibromyalgia, hypothyroidism Is this a behavioral health or  substance abuse call? ---No Guidelines Guideline Title Affirmed Question Affirmed Notes Nurse Date/Time (Eastern Time) Heart Rate and Heartbeat Questions [1] Dizziness, lightheadedness, or weakness AND [2] heart beating very slowly (e.g., < 50 / minute) Jimmye Norman, RN, [place] 03/01/2020 9:57:20 AM PLEASE NOTE: All timestamps contained within this report are represented as [place] Standard Time. CONFIDENTIALTY NOTICE: This fax transmission is intended only for the addressee. It contains information that is legally privileged, confidential or otherwise protected from use or disclosure. If you are not the intended recipient, you are strictly prohibited from reviewing, disclosing, copying using or disseminating any of this information or taking any action in reliance on or regarding this information. If you have received this fax in error, please notify us immediately by telephone so that we can arrange for its return to [place]. Phone: [phone], Toll-Free: [phone], Fax: [phone] Page: 2 of 2 Call Id: [id number] [place]. Time Eilene Ghazi Time) Disposition Final User 03/01/2020 10:03:55 AM 911 Outcome Documentation Jimmye Norman, RN, Loree Fee Reason: Caller states she will call an ambulance, husband is with her, no need for follow up call. 03/01/2020 10:03:14 AM Call EMS 911 Now Yes Jimmye Norman, RN, Loree Fee Caller Disagree/Comply Comply Caller Understands No PreDisposition InappropriateToAsk Care Advice Given Per Guideline CALL EMS 911 NOW: * Immediate medical attention is needed. You need to hang up and call 911 (or an ambulance). Referrals GO TO FACILITY OTHER - SPECIFY

## 2020-03-02 ENCOUNTER — Telehealth: Payer: Self-pay | Admitting: Cardiovascular Disease

## 2020-03-02 ENCOUNTER — Emergency Department: Payer: BC Managed Care – PPO

## 2020-03-02 ENCOUNTER — Telehealth: Payer: Self-pay | Admitting: Endocrinology

## 2020-03-02 ENCOUNTER — Emergency Department
Admission: EM | Admit: 2020-03-02 | Discharge: 2020-03-02 | Disposition: A | Discharge status: Home / Self Care | Payer: BC Managed Care – PPO | Attending: Student in an Organized Health Care Education/Training Program | Admitting: Student in an Organized Health Care Education/Training Program

## 2020-03-02 ENCOUNTER — Other Ambulatory Visit: Payer: Self-pay

## 2020-03-02 ENCOUNTER — Ambulatory Visit: Payer: BC Managed Care – PPO | Admitting: Psychology

## 2020-03-02 DIAGNOSIS — Z79899 Other long term (current) drug therapy: Secondary | ICD-10-CM | POA: Diagnosis not present

## 2020-03-02 DIAGNOSIS — J45909 Unspecified asthma, uncomplicated: Secondary | ICD-10-CM | POA: Diagnosis not present

## 2020-03-02 DIAGNOSIS — I517 Cardiomegaly: Secondary | ICD-10-CM | POA: Diagnosis not present

## 2020-03-02 DIAGNOSIS — R0600 Dyspnea, unspecified: Secondary | ICD-10-CM | POA: Diagnosis not present

## 2020-03-02 DIAGNOSIS — Z03818 Encounter for observation for suspected exposure to other biological agents ruled out: Secondary | ICD-10-CM | POA: Diagnosis not present

## 2020-03-02 DIAGNOSIS — I5032 Chronic diastolic (congestive) heart failure: Secondary | ICD-10-CM | POA: Diagnosis not present

## 2020-03-02 DIAGNOSIS — Z7984 Long term (current) use of oral hypoglycemic drugs: Secondary | ICD-10-CM | POA: Insufficient documentation

## 2020-03-02 DIAGNOSIS — Z794 Long term (current) use of insulin: Secondary | ICD-10-CM | POA: Insufficient documentation

## 2020-03-02 DIAGNOSIS — Z9013 Acquired absence of bilateral breasts and nipples: Secondary | ICD-10-CM | POA: Diagnosis not present

## 2020-03-02 DIAGNOSIS — R079 Chest pain, unspecified: Secondary | ICD-10-CM | POA: Diagnosis not present

## 2020-03-02 DIAGNOSIS — U071 COVID-19: Secondary | ICD-10-CM | POA: Diagnosis not present

## 2020-03-02 DIAGNOSIS — I110 Hypertensive heart disease with heart failure: Secondary | ICD-10-CM | POA: Diagnosis not present

## 2020-03-02 DIAGNOSIS — J9811 Atelectasis: Secondary | ICD-10-CM | POA: Diagnosis not present

## 2020-03-02 DIAGNOSIS — Z87891 Personal history of nicotine dependence: Secondary | ICD-10-CM | POA: Diagnosis not present

## 2020-03-02 DIAGNOSIS — Z20822 Contact with and (suspected) exposure to covid-19: Secondary | ICD-10-CM | POA: Diagnosis not present

## 2020-03-02 DIAGNOSIS — R0602 Shortness of breath: Secondary | ICD-10-CM | POA: Diagnosis not present

## 2020-03-02 DIAGNOSIS — Z853 Personal history of malignant neoplasm of breast: Secondary | ICD-10-CM | POA: Diagnosis not present

## 2020-03-02 DIAGNOSIS — E039 Hypothyroidism, unspecified: Secondary | ICD-10-CM | POA: Insufficient documentation

## 2020-03-02 DIAGNOSIS — I2510 Atherosclerotic heart disease of native coronary artery without angina pectoris: Secondary | ICD-10-CM | POA: Insufficient documentation

## 2020-03-02 DIAGNOSIS — E876 Hypokalemia: Secondary | ICD-10-CM

## 2020-03-02 DIAGNOSIS — C50112 Malignant neoplasm of central portion of left female breast: Secondary | ICD-10-CM | POA: Diagnosis not present

## 2020-03-02 DIAGNOSIS — C50111 Malignant neoplasm of central portion of right female breast: Secondary | ICD-10-CM | POA: Diagnosis not present

## 2020-03-02 LAB — CBC
HCT: 47.3 % — ABNORMAL HIGH (ref 36.0–46.0)
Hemoglobin: 16.2 g/dL — ABNORMAL HIGH (ref 12.0–15.0)
MCH: 31 pg (ref 26.0–34.0)
MCHC: 34.2 g/dL (ref 30.0–36.0)
MCV: 90.4 fL (ref 80.0–100.0)
Platelets: 370 10*3/uL (ref 150–400)
RBC: 5.23 MIL/uL — ABNORMAL HIGH (ref 3.87–5.11)
RDW: 13.5 % (ref 11.5–15.5)
WBC: 7.7 10*3/uL (ref 4.0–10.5)
nRBC: 0 % (ref 0.0–0.2)

## 2020-03-02 LAB — BASIC METABOLIC PANEL
Anion gap: 11 (ref 5–15)
BUN: 13 mg/dL (ref 8–23)
CO2: 22 mmol/L (ref 22–32)
Calcium: 9.6 mg/dL (ref 8.9–10.3)
Chloride: 109 mmol/L (ref 98–111)
Creatinine, Ser: 0.9 mg/dL (ref 0.44–1.00)
GFR, Estimated: >60 mL/min (ref >60)
Glucose, Bld: 137 mg/dL — ABNORMAL HIGH (ref 70–99)
Potassium: 2.9 mmol/L — ABNORMAL LOW (ref 3.5–5.1)
Sodium: 142 mmol/L (ref 135–145)

## 2020-03-02 LAB — TROPONIN I (HIGH SENSITIVITY): Troponin I (High Sensitivity): 7 ng/L (ref <18)

## 2020-03-02 LAB — TSH: TSH: 1.705 u[IU]/mL (ref 0.350–4.500)

## 2020-03-02 MED ORDER — PREDNISONE 10 MG PO TABS: ORAL_TABLET | ORAL | 0 refills | Status: DC

## 2020-03-02 MED ORDER — POTASSIUM CHLORIDE CRYS ER 20 MEQ PO TBCR
40.0000 meq | EXTENDED_RELEASE_TABLET | Freq: Once | ORAL | Status: AC
  Administered 2020-03-02: 40 meq via ORAL
  Filled 2020-03-02: qty 2

## 2020-03-02 MED ORDER — ALBUTEROL SULFATE HFA 108 (90 BASE) MCG/ACT IN AERS: 1.0000 | INHALATION_SPRAY | Freq: Four times a day (QID) | RESPIRATORY_TRACT | 1 refills | Status: DC | PRN

## 2020-03-02 MED ORDER — POTASSIUM CHLORIDE ER 10 MEQ PO TBCR: 10.0000 meq | EXTENDED_RELEASE_TABLET | Freq: Every day | ORAL | 0 refills | Status: DC

## 2020-03-02 MED ORDER — IOHEXOL 350 MG/ML SOLN
75.0000 mL | Freq: Once | INTRAVENOUS | Status: AC | PRN
  Administered 2020-03-02: 75 mL via INTRAVENOUS
  Filled 2020-03-02: qty 75

## 2020-03-02 NOTE — Telephone Encounter (Signed)
STAT if HR is under 50 or over 120 (normal HR is 60-100 beats per minute)  1) What is your heart rate? 35-42  2) Do you have a log of your heart rate readings (document readings)? Been between 35/42 for the past 48 hours, in parking lot of ED now  3) Do you have any other symptoms?  Mild angina. Had lab work and chest x ray done last night in ED as well

## 2020-03-02 NOTE — Discharge Instructions (Addendum)
Call make an appointment with your primary care provider for follow-up in 7 days.  Begin taking medication as prescribed.  The albuterol inhaler is 1 or 2 puffs every 6 hours as needed for shortness of breath.  This medication could cause jitteriness which is not a allergy but a side effect that is common with this medication.  Also the prednisone should help with the shortness of breath.  This medication can increase your blood sugar temporarily.  Be careful to stay on your diabetic diet at this time.  The prednisone is a tapering dose for short period of time.  The potassium is 1 tablet once a day starting tomorrow as you had your first dose while in the emergency department.  You will want to see your primary care provider for for reevaluation of your breathing and also for your potassium to be rechecked to see if you need to continue with potassium tablets.  Return to the emergency department if any severe worsening of your symptoms over the weekend.

## 2020-03-02 NOTE — Telephone Encounter (Signed)
Reviewed the patient's chart. She is currently in the ER.  She also sent a MyChart message.  Routing to Ignacia Bayley, NP as an Juluis Rainier- he last saw her on 12/16/19.

## 2020-03-02 NOTE — ED Provider Notes (Signed)
[facility] Emergency Department Provider Note  ____________________________________________   Event Date/Time   First MD Initiated Contact with Patient 03/02/20 1257     (approximate)  I have reviewed the triage vital signs and the nursing notes.   HISTORY  Chief Complaint Shortness of Breath   HPI Lindsay Fry is a 60 y.o. female presents to the ED with complaint of shortness of breath and a heart rate between 35 and 40 for the last 48 hours.  Patient states that she went to [facility] ED yesterday but left without being seen.  Patient tested positive for Covid on 02/18/2020.  Patient reports that she had been vaccinated with [place] vaccine in June.  Patient discontinued smoking in 1995.  Patient also complains of some mild anterior chest pain with a history of CAD and non-STEMI MI, diabetes, hypothyroidism, OCD and bipolar.  Patient denies any use of nitroglycerin.  She also did not take any medication last evening to help her sleep for fear that it was slow her heart rate down more.  Troponin was 8 at [facility] yesterday.         Past Medical History:  Diagnosis Date  . ADD (attention deficit disorder) 11/19/2012  . Allergic rhinitis, cause unspecified   . Anxiety state, unspecified   . Arthritis   . Asymptomatic postmenopausal status (age-related) (natural)   . Atypical hyperplasia of left breast 2000  . Atypical mole 03/16/2018   R paraspinal mid upper back, excision  . Atypical mole 02/17/2018   R spinal upper back, moderate  . Atypical mole 08/11/2017   L sternum, excision  . Atypical mole 05/27/2017   L post neck, moderate  . Benign neoplasm of other and unspecified site of the digestive system   . Bipolar disorder ([place])   . Breast cancer ([place])    Left Breast 50mm invasive CA left uiq, dcis left uoq and a lot ADH, ALH in both breasts.  Marland Kitchen CAD (coronary artery disease)    a. 08/2014 NSTEMI/Cath: LM nl, LAD 50p, D1/D2 min irregs, LCX nl,  OM1 40, LPDA nl, RI 99ost (2.63mm vessel->med Rx), RCA nl, AM 60, nl EF; b. 11/2016 MV: EF 74%, no ischemia (performed 2/2 dyspnea & new inf TWI).  Marland Kitchen Carcinoma of left breast ([place]) 06/02/2014  . Cataract   . Chronic diastolic CHF (congestive heart failure) ([place])    a. 08/2014 Echo: EF 60-65%, Gr 1 DD, nl LA.  Marland Kitchen Clotting disorder ([place])    on blood thiner, Plavix  . Cyst    on Achilles tendon  . Depression   . Diabetes mellitus ([place])    frank  . Dysuria   . Edema   . Family history of malignant neoplasm of gastrointestinal tract   . Fibromyalgia   . GERD (gastroesophageal reflux disease)   . Glaucoma    pre glaucoma, 05/14/2018 pt. denies glaucoma  . Headache    migraines  . Hiatal hernia   . History of alcoholism ([place])   . History of migraines   . History of ovarian cyst   . Hx of adenomatous colonic polyps   . Insomnia, unspecified   . Myocardial infarction ([place]) 09-04-14  . OCD (obsessive compulsive disorder)   . Osteopenia 09/25/2016   Femoral neck T -1.1  9/18  . Other screening mammogram   . Pure hypercholesterolemia   . Rosacea   . Subacute confusional state 11/19/2012  . Tubular adenoma of colon 01/10/11  . Ulcerative colitis,  unspecified   . Unspecified asthma(493.90)   . Unspecified essential hypertension   . Unspecified hypothyroidism   . Unspecified vitamin D deficiency   . Vertigo     Patient Active Problem List   Diagnosis Date Noted  . Double vision 12/14/2019  . Memory disturbance 08/05/2019  . Obesity (BMI 30-39.9) 04/23/2019  . Dizziness 12/16/2018  . Poor balance 12/16/2018  . Fatigue 12/16/2018  . Migraine 11/12/2018  . Exposure to COVID-19 virus 06/19/2018  . Thoracic back pain 02/19/2018  . Uncontrolled type 2 diabetes mellitus with hyperglycemia, with long-term current use of insulin ([place]) 02/19/2018  . Pleural thickening 11/05/2017  . Abrasion of right knee 11/05/2017  . Fall 11/05/2017  . Mouth ulcers 11/05/2017  . Cervical radiculopathy due  to degenerative joint disease of spine 10/28/2017  . 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 Supraventricular tachycardia ([place]) 04/22/2017  . Osteopenia 09/25/2016  . Estrogen deficiency 05/30/2016  . Need for hepatitis C screening test 03/19/2016  . Screening for HIV (human immunodeficiency virus) 03/19/2016  . Carcinoma of overlapping sites of left breast in female, estrogen receptor positive ([place]) 08/21/2015  . Chronic constipation 01/18/2015  . NSTEMI (non-ST elevated myocardial infarction) ([place]) 09/06/2014  . CAD (coronary artery disease) 09/06/2014  . DM (diabetes mellitus) ([place]) 09/05/2014  . 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 History of breast cancer 06/02/2014  . Sinus tachycardia 04/27/2014  . ADD (attention deficit disorder) 11/19/2012  . Urinary frequency 08/05/2012  . Low back pain 08/05/2012  . Hypothyroid 05/18/2012  . Chronic sinusitis 01/24/2012  . Vertigo, benign positional 01/24/2012  . Other screening mammogram 12/05/2010  . Routine general medical examination at a health care facility 10/14/2010  . FATIGUE 08/09/2009  . POSTMENOPAUSAL STATUS 06/22/2008  . Vitamin D deficiency 03/18/2008  . BENIGN NEOPLASM [facility] SITE DIGESTIVE SYSTEM 01/09/2007  . HIATAL HERNIA 01/09/2007  . COLONIC POLYPS, ADENOMATOUS, HX OF 01/09/2007  . Hyperlipidemia associated with type 2 diabetes mellitus ([place]) 01/07/2007  . Bipolar I disorder, most recent episode depressed ([place]) 01/07/2007  . Essential hypertension 01/07/2007  . ALLERGIC RHINITIS 01/07/2007  . ASTHMA 01/07/2007  . GERD 01/07/2007  . Ulcerative colitis ([place]) 01/07/2007  . ACNE ROSACEA 01/07/2007  . MIGRAINES, HX OF 01/07/2007  . Fibromyalgia 10/30/2006  . INSOMNIA 10/30/2006    Past Surgical History:  Procedure Laterality Date  . APPENDECTOMY    . AXILLARY SENTINEL NODE BIOPSY Left 05/23/2014   Procedure: AXILLARY SENTINEL NODE BIOPSY;  Surgeon: Christene Lye, MD;  Location: [facility] ORS;  Service:  General;  Laterality: Left;  . BREAST BIOPSY  Aug 2000   on tamoxifen, atypical hyperplasia  . BREAST BIOPSY Left 04/2014  . BREAST SURGERY Left Aug 2000   lumpectomy/ Dr Sharlet Salina  . BREAST SURGERY Bilateral 05/23/14   Mastectomy  . BUNIONECTOMY Right   . CARDIAC CATHETERIZATION N/A 09/05/2014   Procedure: Left Heart Cath and Coronary Angiography;  Surgeon: Wellington Hampshire, MD;  Location: [place] [facility];  Service: Cardiovascular;  Laterality: N/A;  . CARDIAC CATHETERIZATION  09-05-14  . CATARACT EXTRACTION W/ INTRAOCULAR LENS IMPLANT Bilateral   . CESAREAN SECTION  1996/1997   placenta previa, gest DM, pre-eclampsia  . CHOLECYSTECTOMY  1997   adhesions also  . COLONOSCOPY  01/2001   Ulcerative colitis  . COLONOSCOPY  04/2004   UC, polyp  . COLONOSCOPY  12/08   UC, no polyps  . DEXA  04/1999 and 2010   normal  . ESOPHAGOGASTRODUODENOSCOPY  09/2001  polyp  . exercise stress test  11/2003   negative  . FEMUR FRACTURE SURGERY Left   . MASTECTOMY Bilateral   . MECKEL DIVERTICULUM EXCISION  1999  . NASAL SINUS SURGERY  05/1997  . nuclear stress test  06/2007   negative  . precancerous mole removed    . SIMPLE MASTECTOMY WITH AXILLARY SENTINEL NODE BIOPSY Bilateral 05/23/2014   Procedure: Bilateral simple mastectomy, left sentinel node biopsy ;  Surgeon: Christene Lye, MD;  Location: [facility] ORS;  Service: General;  Laterality: Bilateral;  . Sleep study  11/09   no apnea, but did snore (done by [facility])  . TONSILLECTOMY  1984  . TUBAL LIGATION    . WISDOM TOOTH EXTRACTION  1980's    Prior to Admission medications   Medication Sig Start Date End Date Taking? Authorizing Provider  albuterol (VENTOLIN HFA) 108 (90 Base) MCG/ACT inhaler Inhale 1-2 puffs into the lungs every 6 (six) hours as needed for wheezing or shortness of breath. 03/02/20  Yes Letitia Neri L, PA-C  potassium chloride (KLOR-CON) 10 MEQ tablet Take 1 tablet (10 mEq total) by mouth daily. 03/02/20  Yes  Johnn Hai, PA-C  predniSONE (DELTASONE) 10 MG tablet Take 4 tablets once a day for 2 days, 3 tablets once a day for 2 days, 2 tablets for 2 days and 1 tablet for 2 days 03/02/20  Yes Johnn Hai, PA-C  acetaminophen (TYLENOL) 650 MG CR tablet Take 1,300 mg by mouth 2 (two) times daily.    [provider]  amantadine (SYMMETREL) 100 MG capsule Take 100 mg by mouth 2 (two) times daily.    [provider]  atorvastatin (LIPITOR) 80 MG tablet TAKE 1 TABLET(80 MG) BY MOUTH DAILY 12/30/19   Dunn, Areta Haber, PA-C  BAYER MICROLET LANCETS lancets USE AS DIRECTED TO CHECK BLOOD SUGAR TWICE DAILY 05/20/16   Elayne Snare, MD  Bioflavonoid Products (VITAMIN C) CHEW Chew 1 tablet by mouth daily.    [provider]  Blood Glucose Monitoring Suppl (CONTOUR NEXT MONITOR) w/Device KIT 1 each by Does not apply route in the morning and at bedtime. Use Contour Next meter to check blood sugar twice daily. 04/29/19   Elayne Snare, MD  Calcium Carb-Cholecalciferol 600-800 MG-UNIT TABS Take 1 tablet by mouth daily.    [provider]  carbamazepine (TEGRETOL XR) 200 MG 12 hr tablet Take 300 mg by mouth at bedtime. 100 mg 3 tabs a daily at bedtime 12/04/19   [provider]  Cholecalciferol (VITAMIN D3) 125 MCG (5000 UT) CAPS Take 1 capsule by mouth daily.    [provider]  clopidogrel (PLAVIX) 75 MG tablet TAKE 1 TABLET(75 MG) BY MOUTH DAILY 12/27/19   Minna Merritts, MD  co-enzyme Q-10 50 MG capsule Take 50 mg by mouth daily.    [provider]  Cyanocobalamin (VITAMIN B-12 PO) 3000 mcg-- 2 gummies daily    [provider]  diazepam (VALIUM) 10 MG tablet Take 2.5-10 mg by mouth as needed for anxiety.    [provider]  diltiazem (CARDIZEM) 30 MG tablet Take 1 tablet (30 mg) by mouth up to three times a day as needed for a blood pressure > 150 (top number) 07/29/19   Dunn, Areta Haber, PA-C  diltiazem (TIAZAC) 300 MG 24 hr capsule Take 1  capsule (300 mg total) by mouth daily. 12/16/19   Theora Gianotti, NP  docusate sodium (COLACE) 100 MG capsule Take by mouth. 3  in the morning and 2 at bedtime    [provider]  Evolocumab (REPATHA) 140 MG/ML SOSY Inject contents of pen twice a month 02/09/20   Elayne Snare, MD  ezetimibe (ZETIA) 10 MG tablet TAKE 1 TABLET(10 MG) BY MOUTH DAILY 01/24/20   Elayne Snare, MD  famotidine (PEPCID) 40 MG tablet Take 40 mg by mouth 2 (two) times daily.    [provider]  gabapentin (NEURONTIN) 300 MG capsule TAKE 1 CAPSULE(300 MG) BY MOUTH THREE TIMES DAILY 11/18/19   Tower, Aquilla A, MD  glucose blood (CONTOUR NEXT TEST) test strip Use as instructed 11/18/19   Elayne Snare, MD  insulin detemir (LEVEMIR FLEXTOUCH) 100 UNIT/ML FlexPen ADMINISTER 51 UNITS UNDER THE SKIN EVERY DAY IN THE EVENING Patient taking differently: 60 Units. ADMINISTER 60 UNITS UNDER THE SKIN EVERY DAY IN THE EVENING 12/27/19   Elayne Snare, MD  Insulin Pen Needle (B-D UF III MINI PEN NEEDLES) 31G X 5 MM MISC USE FOR INJECTIONS TWICE DAILY 11/18/19   Elayne Snare, MD  JARDIANCE 25 MG TABS tablet TAKE 1 TABLET(25 MG) BY MOUTH DAILY 12/27/19   Elayne Snare, MD  ketoconazole (NIZORAL) 2 % cream Apply to affected areas one to two times daily as needed. 05/11/19   Brendolyn Patty, MD  Lactobacillus-Inulin (CULTURELLE DIGESTIVE DAILY PO) Take 1 capsule by mouth daily.    [provider]  lamoTRIgine (LAMICTAL) 200 MG tablet Take 400 mg by mouth daily.    [provider]  latanoprost (XALATAN) 0.005 % ophthalmic solution Place 1 drop into both eyes 2 (two) times a week.  10/12/10   [provider]  LEVOXYL 125 MCG tablet Take 2 tablets (250 mcg total) by mouth daily before breakfast. 12/27/19   Elayne Snare, MD  linaclotide [facility]) 290 MCG CAPS capsule Take 1 capsule (290 mcg total) by mouth daily before breakfast. 11/19/19   Irene Shipper, MD  loperamide (IMODIUM A-D) 2 MG tablet Take 2 mg by mouth  as needed for diarrhea or loose stools.    [provider]  meclizine (ANTIVERT) 25 MG tablet Take 25 mg by mouth 2 (two) times daily.    [provider]  Melatonin 10 MG CAPS Take 1 capsule by mouth at bedtime.    [provider]  mesalamine (LIALDA) 1.2 g EC tablet TAKE 2 TABLETS BY MOUTH DAILY 12/24/19   Irene Shipper, MD  Multiple Vitamin (MULTIVITAMIN) tablet Take 1 tablet by mouth daily.    [provider]  nitroGLYCERIN (NITROSTAT) 0.4 MG SL tablet Place 1 tablet (0.4 mg total) under the tongue every 5 (five) minutes as needed for chest pain. Patient not taking: Reported on 09/22/2019 10/28/18 09/22/19  Minna Merritts, MD  Omega-3 Krill Oil 500 MG CAPS Take 500 mg by mouth.    [provider]  ondansetron (ZOFRAN-ODT) 4 MG disintegrating tablet DISSOLVE 1 TABLET(4 MG) ON THE TONGUE EVERY 6 HOURS AS NEEDED FOR NAUSEA OR VOMITING 09/22/19   Irene Shipper, MD  pantoprazole (PROTONIX) 40 MG tablet TAKE 1 TABLET(40 MG) BY MOUTH TWICE DAILY 10/18/19   Irene Shipper, MD  Probiotic Product (PROBIOTIC DAILY PO) Take by mouth.    [provider]  QUEtiapine (SEROQUEL XR) 400 MG 24 hr tablet Take 800 mg by mouth at bedtime.    [provider]  Semaglutide, 1 MG/DOSE, (OZEMPIC, 1 MG/DOSE,) 4 MG/3ML SOPN INJECT $RemoveBef'1MG'nXHHcOIvoa$  INTO THE SKIN ONCE A WEEK 01/21/20   Elayne Snare, MD  Simethicone (  GAS-X PO) Take 1 tablet by mouth 4 (four) times daily.    [provider]  valACYclovir (VALTREX) 1000 MG tablet TAKE 1 TABLET(1000 MG) BY MOUTH DAILY 01/24/20   Tower, Wynelle Fanny, MD  vitamin C (ASCORBIC ACID) 500 MG tablet Take 250 mg by mouth daily. Takes 2 a day    [provider]  VYVANSE 50 MG capsule Take 45 mg by mouth every morning. 01/21/20   [provider]  VYVANSE 60 MG capsule Take 60 mg by mouth every morning.  Patient not taking: Reported on 02/08/2020 10/05/17   [provider]  zaleplon (SONATA) 10 MG capsule Take 20 mg by  mouth at bedtime as needed for sleep.    [provider]    Allergies Ephedrine, Aspartame and phenylalanine, Aspirin, Crestor [rosuvastatin], Erythromycin, Nsaids, and Oxycodone  Family History  Problem Relation Age of Onset  . Coronary artery disease Father   . Colon cancer Father   . Alzheimer's disease Father   . Dementia Father   . Colon polyps Father   . Coronary artery disease Mother   . Hypertension Mother   . Osteoporosis Mother   . Crohn's disease Mother        crohns colitis  . Breast cancer Other        great aunts  . Coronary artery disease Other        Uncle (also AAA)  . Diabetes Other        remote family history  . Stroke Cousin   . Esophageal cancer Neg Hx   . Rectal cancer Neg Hx   . Stomach cancer Neg Hx     Social History Social History   Tobacco Use  . Smoking status: Former Smoker    Packs/day: 0.25    Years: 5.00    Pack years: 1.25    Types: Cigarettes    Quit date: 01/14/1993    Years since quitting: 27.1  . Smokeless tobacco: Never Used  Vaping Use  . Vaping Use: Never used  Substance Use Topics  . Alcohol use: No    Alcohol/week: 0.0 standard drinks    Comment: Recovered ETOH  . Drug use: No    Review of Systems Constitutional: No fever/chills Eyes: No visual changes. ENT: No sore throat. Cardiovascular: Positive anterior chest wall pain.  Positive bradycardia at home. Respiratory: Positive shortness of breath. Gastrointestinal: No abdominal pain.  No nausea, no vomiting.  No diarrhea.   Genitourinary: Negative for dysuria. Musculoskeletal: Negative musculoskeletal pain. Skin: Negative for rash. Neurological: Negative for headaches, focal weakness or numbness. Psychiatric:  History bipolar, anxiety and OCD. Endocrine:  Positive diabetes, hypothyroidism.  ____________________________________________   PHYSICAL EXAM:  VITAL SIGNS: ED Triage Vitals  Enc Vitals Group     BP 03/02/20 1250 123/76     Pulse Rate  03/02/20 1250 93     Resp 03/02/20 1250 17     Temp 03/02/20 1250 98.1 F (36.7 C)     Temp Source 03/02/20 1250 Oral     SpO2 03/02/20 1250 97 %     Weight 03/02/20 1247 195 lb (88.5 kg)     Height 03/02/20 1247 $RemoveBefor'5\' 6"'[id number]$  (1.676 m)     Head Circumference --      Peak Flow --      Pain Score 03/02/20 1248 2     Pain Loc --      Pain Edu? --      Excl. in [place]? --  Constitutional: Alert and oriented. Well appearing and in no acute distress.  Patient is able to talk in complete sentences without any difficulty. Eyes: Conjunctivae are normal.  Head: Atraumatic. Nose: No congestion/rhinnorhea. Neck: No stridor.   Cardiovascular: Normal rate, regular rhythm. Grossly normal heart sounds.  Good peripheral circulation. Respiratory: Normal respiratory effort.  No retractions. Lungs CTAB. Gastrointestinal: Soft and nontender. No distention.  Musculoskeletal: Moves upper and lower extremities without any difficulty.  There is no edema noted to lower extremities or skin discoloration. Neurologic:  Normal speech and language. No gross focal neurologic deficits are appreciated.  Skin:  Skin is warm, dry and intact. No rash noted. Psychiatric: Mood and affect are normal. Speech and behavior are normal.  ____________________________________________   LABS (all labs ordered are listed, but only abnormal results are displayed)  Labs Reviewed  BASIC METABOLIC PANEL - Abnormal; Notable for the following components:      Result Value   Potassium 2.9 (*)    Glucose, Bld 137 (*)    All other components within normal limits  CBC - Abnormal; Notable for the following components:   RBC 5.23 (*)    Hemoglobin 16.2 (*)    HCT 47.3 (*)    All other components within normal limits  TSH  TROPONIN I (HIGH SENSITIVITY)  TROPONIN I (HIGH SENSITIVITY)   ____________________________________________  EKG  EKG was reviewed by doctor on the major side of the ED. Normal sinus rhythm with ventricular rate  of 83. ST T wave abnormality. PR 202, QRS 106 ____________________________________________  RADIOLOGY Beaulah Corin, personally viewed and evaluated these images (plain radiographs) as part of my medical decision making, as well as reviewing the written report by the radiologist.    Official radiology report(s): DG Chest 2 View  Result Date: 03/02/2020 CLINICAL DATA:  Dyspnea EXAM: CHEST - 2 VIEW COMPARISON:  03/01/2020 chest radiograph. FINDINGS: Stable cardiomediastinal silhouette with normal heart size. No pneumothorax. No pleural effusion. Lungs appear clear, with no acute consolidative airspace disease and no pulmonary edema. Cholecystectomy clips are seen in the right upper quadrant of the abdomen. IMPRESSION: No active cardiopulmonary disease. Electronically Signed   By: Delbert Phenix M.D.   On: 03/02/2020 13:26   DG Chest 2 View  Result Date: 03/01/2020 CLINICAL DATA:  Chest pain EXAM: CHEST - 2 VIEW COMPARISON:  11/05/2017 FINDINGS: Right apical pleural thickening as before. No focal consolidation. Linear scarring or atelectasis left base. No pleural effusion. Stable cardiomediastinal silhouette with borderline to mild cardiomegaly. Aortic atherosclerosis. No pneumothorax. Dextroscoliosis of the spine. IMPRESSION: No active cardiopulmonary disease. Borderline to mild cardiomegaly. Streaky atelectasis or scar at the left base Electronically Signed   By: Jasmine Pang M.D.   On: 03/01/2020 18:09   CT Angio Chest PE W and/or Wo Contrast  Result Date: 03/02/2020 CLINICAL DATA:  Chest pain and shortness of breath EXAM: CT ANGIOGRAPHY CHEST WITH CONTRAST TECHNIQUE: Multidetector CT imaging of the chest was performed using the standard protocol during bolus administration of intravenous contrast. Multiplanar CT image reconstructions and MIPs were obtained to evaluate the vascular anatomy. CONTRAST:  61mL OMNIPAQUE IOHEXOL 350 MG/ML SOLN COMPARISON:  None. FINDINGS: Cardiovascular:  Contrast injection is sufficient to demonstrate satisfactory opacification of the pulmonary arteries to the segmental level. There is no pulmonary embolus or evidence of right heart strain. The size of the main pulmonary artery is normal. Cardiomegaly with coronary artery calcification. The course and caliber of the aorta are normal. There is no atherosclerotic calcification.  Opacification decreased due to pulmonary arterial phase contrast bolus timing. Mediastinum/Nodes: -- No mediastinal lymphadenopathy. -- No hilar lymphadenopathy. -- No axillary lymphadenopathy. -- No supraclavicular lymphadenopathy. --the thyroid gland is not visualized and may be surgically absent. -  Unremarkable esophagus. Lungs/Pleura: The lung volumes are low. There is a somewhat mosaic appearance of the lung parenchyma bilaterally which which may be secondary to low lung volumes. There is no pneumothorax. No large pleural effusion. There is atelectasis at the lung bases. Upper Abdomen: Contrast bolus timing is not optimized for evaluation of the abdominal organs. The visualized portions of the organs of the upper abdomen are normal. Musculoskeletal: No chest wall abnormality. No bony spinal canal stenosis. Review of the MIP images confirms the above findings. IMPRESSION: 1. No evidence of pulmonary embolus. 2. Cardiomegaly with coronary artery calcification. 3. Low lung volumes with mosaic appearance of the lung parenchyma bilaterally which may be secondary to low lung volumes versus small airway disease. Aortic Atherosclerosis (ICD10-I70.0). Electronically Signed   By: Constance Holster M.D.   On: 03/02/2020 16:10    ____________________________________________   PROCEDURES  Procedure(s) performed (including Critical Care):  Procedures   ____________________________________________   INITIAL IMPRESSION / ASSESSMENT AND PLAN / ED COURSE  As part of my medical decision making, I reviewed the following data within the  electronic MEDICAL RECORD NUMBER Notes from prior ED visits and [place] Controlled Substance Database  60 year old female presents to the ED with complaint of shortness of breath and monitoring her heart rate at home which was in between 35 and 40 bpm for approximately 48 hours.  Patient states that she went to [facility] ED last evening and waited over 8 hours and then left without being seen.  Patient tested positive for Covid on 02/18/2020.  Patient is vaccinated with [employer] and is a former smoker quitting in 1995.  Patient presents to the [facility] ED with continued concerns about shortness of breath.  Vital signs in triage were normal with a blood pressure 123/76, respirations 17, pulse rate 93 and O2 sat 97%.  Chest x-ray was negative and reassuring, troponin was 7, TSH 1.705, CBC unremarkable however BMP did show that patient was hypokalemic at 2.9.  She was given K. Dur 40 mEq while in the ED and waiting for CT chest and further results.  EKG was unremarkable.  CT angio was negative for PE which was concerning but did show low volume secondary to small airway disease.  This was discussed with patient and with her past history of smoking.  Patient states that she has been on an inhaler in the past.  We discussed albuterol and a short course of steroids.  Patient is aware that the 2 medications will help with breathing but could cause some jitteriness which may aggravate her anxiety.  She is encouraged to make an appointment follow-up with her PCP for recheck of her potassium and reevaluation of her breathing.  Patient was discharged with prescription for prednisone 40 mg tapering, albuterol inhaler and potassium 10 mEq daily.  Patient is encouraged to return to the emergency department if any severe worsening of her symptoms or difficulty breathing.  Lab work and notes from last night's visit to [facility] ED were reviewed prior to seeing the patient.  Potassium at [facility] ED was 3.0 yesterday and untreated.    Clinical Course as of  03/02/20 1726  Thu Mar 02, 2020  1440 Potassium(!): 2.9 [RS]    Clinical Course User Index [RS] Johnn Hai, PA-C  ____________________________________________   FINAL CLINICAL IMPRESSION(S) / ED DIAGNOSES  Final diagnoses:  Shortness of breath  COVID  Hypokalemia     ED Discharge Orders         Ordered    predniSONE (DELTASONE) 10 MG tablet        03/02/20 1657    albuterol (VENTOLIN HFA) 108 (90 Base) MCG/ACT inhaler  Every 6 hours PRN        03/02/20 1657    potassium chloride (KLOR-CON) 10 MEQ tablet  Daily        03/02/20 1657          *Please note:  JAMAYA SLEETH was evaluated in Emergency Department on 03/02/2020 for the symptoms described in the history of present illness. She was evaluated in the context of the global COVID-19 pandemic, which necessitated consideration that the patient might be at risk for infection with the SARS-CoV-2 virus that causes COVID-19. Institutional protocols and algorithms that pertain to the evaluation of patients at risk for COVID-19 are in a state of rapid change based on information released by regulatory bodies including the CDC and federal and state organizations. These policies and algorithms were followed during the patient's care in the ED.  Some ED evaluations and interventions may be delayed as a result of limited staffing during and the pandemic.*   Note:  This document was prepared using Dragon voice recognition software and may include unintentional dictation errors.    Johnn Hai, PA-C 03/02/20 1727    Arta Silence, MD 03/02/20 1940

## 2020-03-02 NOTE — Telephone Encounter (Signed)
Pt called because her heart rate has been really low the past 2 days and is concerned. Pt states she went to the hospital yesterday per her nurse at her PCP but was waiting for 8 hrs so she left. Pt says she looked up her symptoms last night and she thinks it has something to do with her hyperthyroid. Pt requests a nurse give her a call ASAP regarding this so that she can speak to her cardiologist as well.  Ph# [phone]

## 2020-03-02 NOTE — Telephone Encounter (Signed)
Her TSH is normal and there is no connection between the low heart rate and hypothyroidism

## 2020-03-02 NOTE — ED Notes (Addendum)
See triage note. Pt c/o SOB, low HR and believes her thyroid levels are off. Pt currently sees endocrinologist. VSS. Pt in NAD

## 2020-03-02 NOTE — ED Notes (Signed)
Pt in xray

## 2020-03-02 NOTE — ED Notes (Signed)
Pt called to be roomed, no response

## 2020-03-02 NOTE — ED Notes (Signed)
Discharge instructions reviewed with pt. Pt calm , collective, denied pain or sob

## 2020-03-02 NOTE — ED Triage Notes (Signed)
Pt states she has been having SHOB and HR 35-40 for 48 hours- pt states that she was seen at [facility] ED and waited over 8 hours and LWBS- pt is also worried about her thyroid levels- pt tested positive for covid with a home test on the 4th

## 2020-03-02 NOTE — Telephone Encounter (Signed)
Called and spoke to [place]. Seferina states that she has had bradycardia for over 48 hours. She has went to the ER for [facility] where they drew labs and she waited for 8 hours before she left without being seen further. Kurstyn is concerned that her low heart rate has been caused by her thyroid issues. Pulse today was 65. Pt feels faint and has heart pain but denies it being a heart attack as she has had 2 EKGs and states that she "knows what they feel like since i've had a heart attack before." Pt has not had any medication changes. She states that she wanted to make Dr. Dwyane Dee aware to see if he wanted to discuss a possible reason for the low heart rate with her Cardiologist.

## 2020-03-02 NOTE — Progress Notes (Signed)
Pt was scheduled and then diverted to ED by nursing staff. No charge.

## 2020-03-03 ENCOUNTER — Encounter: Payer: Self-pay | Admitting: Family Medicine

## 2020-03-03 ENCOUNTER — Encounter: Payer: Self-pay | Admitting: *Deleted

## 2020-03-03 ENCOUNTER — Telehealth: Payer: Self-pay | Admitting: *Deleted

## 2020-03-03 NOTE — Telephone Encounter (Signed)
Noted,  Patient was sent a message through my chart.

## 2020-03-03 NOTE — Telephone Encounter (Signed)
She will continue to take 2 tablets of the 125 mcg and we can see her back in follow-up in April, she does not have an appointment

## 2020-03-05 NOTE — Telephone Encounter (Signed)
Sounds like she needs f/u in clinic to discuss

## 2020-03-06 ENCOUNTER — Telehealth: Payer: Self-pay | Admitting: Endocrinology

## 2020-03-06 ENCOUNTER — Telehealth: Payer: Self-pay

## 2020-03-06 NOTE — Telephone Encounter (Signed)
PA was denied, Dr. Dwyane Dee has wrote an appeal letter to her insurance.

## 2020-03-06 NOTE — Telephone Encounter (Signed)
[facility] called to check on the PA for pt's [place] sent on 2/17. I informed pharmacist it should take 7-10 business days to complete and send back.

## 2020-03-06 NOTE — Telephone Encounter (Signed)
Attempted to schedule.  LMOV to call office.  ° °

## 2020-03-06 NOTE — Telephone Encounter (Signed)
-----   Message from [name], RN sent at 03/06/2020 11:56 AM EST ----- Regarding: Pt needs an appt Dr. Rockey Situ advised to have this pt an office visit, Anderson Malta okay to schedule with Rich[facility] Sox.  Visit for: Lindsay Fry, Lindsay Fry ED visit 2/16-2/17 for same.  Thanks [employer]

## 2020-03-07 ENCOUNTER — Encounter: Payer: Self-pay | Admitting: Family Medicine

## 2020-03-07 ENCOUNTER — Other Ambulatory Visit: Payer: Self-pay

## 2020-03-07 ENCOUNTER — Ambulatory Visit: Payer: BC Managed Care – PPO | Admitting: Endocrinology

## 2020-03-07 ENCOUNTER — Ambulatory Visit (INDEPENDENT_AMBULATORY_CARE_PROVIDER_SITE_OTHER): Payer: BC Managed Care – PPO | Admitting: Family Medicine

## 2020-03-07 VITALS — BP 118/62 | HR 98 | Temp 96.9°F | Ht 65.5 in | Wt 193.4 lb

## 2020-03-07 DIAGNOSIS — E876 Hypokalemia: Secondary | ICD-10-CM | POA: Insufficient documentation

## 2020-03-07 DIAGNOSIS — E669 Obesity, unspecified: Secondary | ICD-10-CM | POA: Diagnosis not present

## 2020-03-07 DIAGNOSIS — Z8616 Personal history of COVID-19: Secondary | ICD-10-CM | POA: Diagnosis not present

## 2020-03-07 DIAGNOSIS — E890 Postprocedural hypothyroidism: Secondary | ICD-10-CM

## 2020-03-07 DIAGNOSIS — Z794 Long term (current) use of insulin: Secondary | ICD-10-CM

## 2020-03-07 DIAGNOSIS — I10 Essential (primary) hypertension: Secondary | ICD-10-CM

## 2020-03-07 DIAGNOSIS — E782 Mixed hyperlipidemia: Secondary | ICD-10-CM | POA: Diagnosis not present

## 2020-03-07 DIAGNOSIS — I700 Atherosclerosis of aorta: Secondary | ICD-10-CM

## 2020-03-07 DIAGNOSIS — E1165 Type 2 diabetes mellitus with hyperglycemia: Secondary | ICD-10-CM

## 2020-03-07 LAB — LIPID PANEL
Cholesterol: 120 mg/dL (ref 0–200)
HDL: 40.3 mg/dL (ref >39.00)
LDL Cholesterol: 46 mg/dL (ref 0–99)
NonHDL: 79.98
Total CHOL/HDL Ratio: 3
Triglycerides: 172 mg/dL — ABNORMAL HIGH (ref 0.0–149.0)
VLDL: 34.4 mg/dL (ref 0.0–40.0)

## 2020-03-07 LAB — BASIC METABOLIC PANEL
BUN: 19 mg/dL (ref 6–23)
CO2: 28 mEq/L (ref 19–32)
Calcium: 9.9 mg/dL (ref 8.4–10.5)
Chloride: 105 mEq/L (ref 96–112)
Creatinine, Ser: 0.84 mg/dL (ref 0.40–1.20)
GFR: 74.89 mL/min (ref >60.00)
Glucose, Bld: 124 mg/dL — ABNORMAL HIGH (ref 70–99)
Potassium: 4.1 mEq/L (ref 3.5–5.1)
Sodium: 142 mEq/L (ref 135–145)

## 2020-03-07 LAB — HEMOGLOBIN A1C: Hgb A1c MFr Bld: 6.7 % — ABNORMAL HIGH (ref 4.6–6.5)

## 2020-03-07 LAB — TSH: TSH: 2.87 u[IU]/mL (ref 0.35–4.50)

## 2020-03-07 LAB — T4, FREE: Free T4: 1.01 ng/dL (ref 0.60–1.60)

## 2020-03-07 NOTE — Progress Notes (Signed)
Subjective:    Patient ID: Lindsay Fry, female    DOB: 04-02-1960, 60 y.o.   MRN: [id number]  This visit occurred during the SARS-CoV-2 public health emergency.  Safety protocols were in place, including screening questions prior to the visit, additional usage of staff PPE, and extensive cleaning of exam room while observing appropriate contact time as indicated for disinfecting solutions.    HPI Pt presents for f/u from ER visit on 2/17 for covid 19  Also needs lab draw for Dr Valeta Harms Readings from Last 3 Encounters:  03/07/20 193 lb 6 oz (87.7 kg)  03/02/20 195 lb (88.5 kg)  02/08/20 205 lb (93 kg)   31.69 kg/m  Pulse ox 95% on RA  Pt was dx with covid on 2/4 (since then tested neg)  She presented for sob/resp symptoms and cp  She was given K for low potassium level of 2.9-improved with replacement  Stabilized and DC after reassuring CTA Px short course of steroids  Taking klor con 10 ,meq daily  Lab Results  Component Value Date   CREATININE 0.84 03/07/2020   BUN 19 03/07/2020   NA 142 03/07/2020   K 4.1 03/07/2020   CL 105 03/07/2020   CO2 28 03/07/2020   Lab Results  Component Value Date   WBC 7.7 03/02/2020   HGB 16.2 (H) 03/02/2020   HCT 47.3 (H) 03/02/2020   MCV 90.4 03/02/2020   PLT 370 03/02/2020     Per pt her HR was 40 last night   She took her K  Was light headed so she used a walker   Is due for cardiology visit   Has to remind herself to slow down Uses inhaler every 6 hours  Tolerating the steroids   DG Chest 2 View  Result Date: 03/02/2020 CLINICAL DATA:  Dyspnea EXAM: CHEST - 2 VIEW COMPARISON:  03/01/2020 chest radiograph. FINDINGS: Stable cardiomediastinal silhouette with normal heart size. No pneumothorax. No pleural effusion. Lungs appear clear, with no acute consolidative airspace disease and no pulmonary edema. Cholecystectomy clips are seen in the right upper quadrant of the abdomen. IMPRESSION: No active cardiopulmonary  disease. Electronically Signed   By: Ilona Sorrel M.D.   On: 03/02/2020 13:26   DG Chest 2 View  Result Date: 03/01/2020 CLINICAL DATA:  Chest pain EXAM: CHEST - 2 VIEW COMPARISON:  11/05/2017 FINDINGS: Right apical pleural thickening as before. No focal consolidation. Linear scarring or atelectasis left base. No pleural effusion. Stable cardiomediastinal silhouette with borderline to mild cardiomegaly. Aortic atherosclerosis. No pneumothorax. Dextroscoliosis of the spine. IMPRESSION: No active cardiopulmonary disease. Borderline to mild cardiomegaly. Streaky atelectasis or scar at the left base Electronically Signed   By: Donavan Foil M.D.   On: 03/01/2020 18:09   CT Angio Chest PE W and/or Wo Contrast  Result Date: 03/02/2020 CLINICAL DATA:  Chest pain and shortness of breath EXAM: CT ANGIOGRAPHY CHEST WITH CONTRAST TECHNIQUE: Multidetector CT imaging of the chest was performed using the standard protocol during bolus administration of intravenous contrast. Multiplanar CT image reconstructions and MIPs were obtained to evaluate the vascular anatomy. CONTRAST:  51mL OMNIPAQUE IOHEXOL 350 MG/ML SOLN COMPARISON:  None. FINDINGS: Cardiovascular: Contrast injection is sufficient to demonstrate satisfactory opacification of the pulmonary arteries to the segmental level. There is no pulmonary embolus or evidence of right heart strain. The size of the main pulmonary artery is normal. Cardiomegaly with coronary artery calcification. The course and caliber of the aorta are normal. There  is no atherosclerotic calcification. Opacification decreased due to pulmonary arterial phase contrast bolus timing. Mediastinum/Nodes: -- No mediastinal lymphadenopathy. -- No hilar lymphadenopathy. -- No axillary lymphadenopathy. -- No supraclavicular lymphadenopathy. --the thyroid gland is not visualized and may be surgically absent. -  Unremarkable esophagus. Lungs/Pleura: The lung volumes are low. There is a somewhat mosaic  appearance of the lung parenchyma bilaterally which which may be secondary to low lung volumes. There is no pneumothorax. No large pleural effusion. There is atelectasis at the lung bases. Upper Abdomen: Contrast bolus timing is not optimized for evaluation of the abdominal organs. The visualized portions of the organs of the upper abdomen are normal. Musculoskeletal: No chest wall abnormality. No bony spinal canal stenosis. Review of the MIP images confirms the above findings. IMPRESSION: 1. No evidence of pulmonary embolus. 2. Cardiomegaly with coronary artery calcification. 3. Low lung volumes with mosaic appearance of the lung parenchyma bilaterally which may be secondary to low lung volumes versus small airway disease. Aortic Atherosclerosis (ICD10-I70.0). Electronically Signed   By: Constance Holster M.D.   On: 03/02/2020 16:10    BP Readings from Last 3 Encounters:  03/07/20 118/62  03/02/20 (!) 160/85  03/01/20 (!) 161/101   Pulse Readings from Last 3 Encounters:  03/07/20 98  03/02/20 80  03/01/20 (!) 62   Was manic and now getting back to normal   Patient Active Problem List   Diagnosis Date Noted  . Hypokalemia 03/07/2020  . History of COVID-19 03/07/2020  . Double vision 12/14/2019  . Memory disturbance 08/05/2019  . Obesity (BMI 30-39.9) 04/23/2019  . Dizziness 12/16/2018  . Poor balance 12/16/2018  . Fatigue 12/16/2018  . Migraine 11/12/2018  . Exposure to COVID-19 virus 06/19/2018  . Thoracic back pain 02/19/2018  . Uncontrolled type 2 diabetes mellitus with hyperglycemia, with long-term current use of insulin ([place]) 02/19/2018  . Pleural thickening 11/05/2017  . Abrasion of right knee 11/05/2017  . Fall 11/05/2017  . Mouth ulcers 11/05/2017  . Cervical radiculopathy due to degenerative joint disease of spine 10/28/2017  . 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 Supraventricular tachycardia ([place]) 04/22/2017  . Osteopenia 09/25/2016  . Estrogen deficiency 05/30/2016  . Need  for hepatitis C screening test 03/19/2016  . Screening for HIV (human immunodeficiency virus) 03/19/2016  . Carcinoma of overlapping sites of left breast in female, estrogen receptor positive ([place]) 08/21/2015  . Chronic constipation 01/18/2015  . NSTEMI (non-ST elevated myocardial infarction) ([place]) 09/06/2014  . CAD (coronary artery disease) 09/06/2014  . DM (diabetes mellitus) ([place]) 09/05/2014  . 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 History of breast cancer 06/02/2014  . Sinus tachycardia 04/27/2014  . ADD (attention deficit disorder) 11/19/2012  . Urinary frequency 08/05/2012  . Low back pain 08/05/2012  . Hypothyroid 05/18/2012  . Chronic sinusitis 01/24/2012  . Vertigo, benign positional 01/24/2012  . Other screening mammogram 12/05/2010  . Routine general medical examination at a health care facility 10/14/2010  . FATIGUE 08/09/2009  . POSTMENOPAUSAL STATUS 06/22/2008  . Vitamin D deficiency 03/18/2008  . BENIGN NEOPLASM [place] SITE DIGESTIVE SYSTEM 01/09/2007  . HIATAL HERNIA 01/09/2007  . COLONIC POLYPS, ADENOMATOUS, HX OF 01/09/2007  . Hyperlipidemia associated with type 2 diabetes mellitus ([place]) 01/07/2007  . Bipolar I disorder, most recent episode depressed ([place]) 01/07/2007  . Essential hypertension 01/07/2007  . ALLERGIC RHINITIS 01/07/2007  . ASTHMA 01/07/2007  . GERD 01/07/2007  . Ulcerative colitis ([place]) 01/07/2007  . ACNE ROSACEA 01/07/2007  . MIGRAINES, HX OF 01/07/2007  .  Fibromyalgia 10/30/2006  . INSOMNIA 10/30/2006   Past Medical History:  Diagnosis Date  . ADD (attention deficit disorder) 11/19/2012  . Allergic rhinitis, cause unspecified   . Anxiety state, unspecified   . Arthritis   . Asymptomatic postmenopausal status (age-related) (natural)   . Atypical hyperplasia of left breast 2000  . Atypical mole 03/16/2018   R paraspinal mid upper back, excision  . Atypical mole 02/17/2018   R spinal upper back, moderate  . Atypical mole  08/11/2017   L sternum, excision  . Atypical mole 05/27/2017   L post neck, moderate  . Benign neoplasm of other and unspecified site of the digestive system   . Bipolar disorder ([place])   . Breast cancer ([place])    Left Breast 25mm invasive CA left uiq, dcis left uoq and a lot ADH, ALH in both breasts.  Marland Kitchen CAD (coronary artery disease)    a. 08/2014 NSTEMI/Cath: LM nl, LAD 50p, D1/D2 min irregs, LCX nl, OM1 40, LPDA nl, RI 99ost (2.35mm vessel->med Rx), RCA nl, AM 60, nl EF; b. 11/2016 MV: EF 74%, no ischemia (performed 2/2 dyspnea & new inf TWI).  Marland Kitchen Carcinoma of left breast ([place]) 06/02/2014  . Cataract   . Chronic diastolic CHF (congestive heart failure) ([place])    a. 08/2014 Echo: EF 60-65%, Gr 1 DD, nl LA.  Marland Kitchen Clotting disorder ([place])    on blood thiner, Plavix  . Cyst    on Achilles tendon  . Depression   . Diabetes mellitus ([place])    frank  . Dysuria   . Edema   . Family history of malignant neoplasm of gastrointestinal tract   . Fibromyalgia   . GERD (gastroesophageal reflux disease)   . Glaucoma    pre glaucoma, 05/14/2018 pt. denies glaucoma  . Headache    migraines  . Hiatal hernia   . History of alcoholism ([place])   . History of migraines   . History of ovarian cyst   . Hx of adenomatous colonic polyps   . Insomnia, unspecified   . Myocardial infarction ([place]) 09-04-14  . OCD (obsessive compulsive disorder)   . Osteopenia 09/25/2016   Femoral neck T -1.1  9/18  . Other screening mammogram   . Pure hypercholesterolemia   . Rosacea   . Subacute confusional state 11/19/2012  . Tubular adenoma of colon 01/10/11  . Ulcerative colitis, unspecified   . Unspecified asthma(493.90)   . Unspecified essential hypertension   . Unspecified hypothyroidism   . Unspecified vitamin D deficiency   . Vertigo    Past Surgical History:  Procedure Laterality Date  . APPENDECTOMY    . AXILLARY SENTINEL NODE BIOPSY Left 05/23/2014   Procedure: AXILLARY SENTINEL NODE BIOPSY;  Surgeon: Christene Lye, MD;  Location: [facility] ORS;  Service: General;  Laterality: Left;  . BREAST BIOPSY  Aug 2000   on tamoxifen, atypical hyperplasia  . BREAST BIOPSY Left 04/2014  . BREAST SURGERY Left Aug 2000   lumpectomy/ Dr Sharlet Salina  . BREAST SURGERY Bilateral 05/23/14   Mastectomy  . BUNIONECTOMY Right   . CARDIAC CATHETERIZATION N/A 09/05/2014   Procedure: Left Heart Cath and Coronary Angiography;  Surgeon: Wellington Hampshire, MD;  Location: [facility];  Service: Cardiovascular;  Laterality: N/A;  . CARDIAC CATHETERIZATION  09-05-14  . CATARACT EXTRACTION W/ INTRAOCULAR LENS IMPLANT Bilateral   . CESAREAN SECTION  1996/1997   placenta previa, gest DM, pre-eclampsia  . CHOLECYSTECTOMY  1997   adhesions also  .  COLONOSCOPY  01/2001   Ulcerative colitis  . COLONOSCOPY  04/2004   UC, polyp  . COLONOSCOPY  12/08   UC, no polyps  . DEXA  04/1999 and 2010   normal  . ESOPHAGOGASTRODUODENOSCOPY  09/2001   polyp  . exercise stress test  11/2003   negative  . FEMUR FRACTURE SURGERY Left   . MASTECTOMY Bilateral   . MECKEL DIVERTICULUM EXCISION  1999  . NASAL SINUS SURGERY  05/1997  . nuclear stress test  06/2007   negative  . precancerous mole removed    . SIMPLE MASTECTOMY WITH AXILLARY SENTINEL NODE BIOPSY Bilateral 05/23/2014   Procedure: Bilateral simple mastectomy, left sentinel node biopsy ;  Surgeon: Kieth Brightly, MD;  Location: [facility] OR[facility];  Service: General;  Laterality: Bilateral;  . Sleep study  11/09   no apnea, but did snore (done by [facility])  . TONSILLECTOMY  1984  . TUBAL LIGATION    . WISDOM TOOTH EXTRACTION  1980's   Social History   Tobacco Use  . Smoking status: Former Smoker    Packs/day: 0.25    Years: 5.00    Pack years: 1.25    Types: Cigarettes    Quit date: 01/14/1993    Years since quitting: 27.1  . Smokeless tobacco: Never Used  Vaping Use  . Vaping Use: Never used  Substance Use Topics  . Alcohol use: No    Alcohol/week: 0.0 standard drinks     Comment: Recovered ETOH  . Drug use: No   Family History  Problem Relation Age of Onset  . Coronary artery disease Father   . Colon cancer Father   . Alzheimer's disease Father   . Dementia Father   . Colon polyps Father   . Coronary artery disease Mother   . Hypertension Mother   . Osteoporosis Mother   . Crohn's disease Mother        crohns colitis  . Breast cancer Other        great aunts  . Coronary artery disease Other        Uncle (also AAA)  . Diabetes Other        remote family history  . Stroke Cousin   . Esophageal cancer Neg Hx   . Rectal cancer Neg Hx   . Stomach cancer Neg Hx    Allergies  Allergen Reactions  . Ephedrine Other (See Comments)    Pt becomes hyper  . Aspartame And Phenylalanine Nausea Only  . Aspirin     REACTION: aggrivates colitis  . Crestor [Rosuvastatin]     REACTION: increased lfts  . Erythromycin     REACTION: GI upset  . Nsaids     REACTION: aggrivate colitis  . Oxycodone Other (See Comments)    Panic Attack   Current Outpatient Medications on File Prior to Visit  Medication Sig Dispense Refill  . acetaminophen (TYLENOL) 650 MG CR tablet Take 1,300 mg by mouth 2 (two) times daily.    Marland Kitchen albuterol (VENTOLIN HFA) 108 (90 Base) MCG/ACT inhaler Inhale 1-2 puffs into the lungs every 6 (six) hours as needed for wheezing or shortness of breath. 8 g 1  . amantadine (SYMMETREL) 100 MG capsule Take 100 mg by mouth 2 (two) times daily.    Marland Kitchen atorvastatin (LIPITOR) 80 MG tablet TAKE 1 TABLET(80 MG) BY MOUTH DAILY 90 tablet 1  . BAYER MICROLET LANCETS lancets USE AS DIRECTED TO CHECK BLOOD SUGAR TWICE DAILY 200 each 2  . Bioflavonoid  Products (VITAMIN C) CHEW Chew 1 tablet by mouth daily.    . Blood Glucose Monitoring Suppl (CONTOUR NEXT MONITOR) w/Device KIT 1 each by Does not apply route in the morning and at bedtime. Use Contour Next meter to check blood sugar twice daily. 1 kit 0  . Calcium Carb-Cholecalciferol 600-800 MG-UNIT TABS Take 1  tablet by mouth daily.    . carbamazepine (TEGRETOL XR) 200 MG 12 hr tablet Take 300 mg by mouth at bedtime. 100 mg 3 tabs a daily at bedtime    . Cholecalciferol (VITAMIN D3) 125 MCG (5000 UT) CAPS Take 1 capsule by mouth daily.    . clopidogrel (PLAVIX) 75 MG tablet TAKE 1 TABLET(75 MG) BY MOUTH DAILY 90 tablet 0  . co-enzyme Q-10 50 MG capsule Take 50 mg by mouth daily.    . Cyanocobalamin (VITAMIN B-12 PO) 3000 mcg-- 2 gummies daily    . diazepam (VALIUM) 10 MG tablet Take 2.5-10 mg by mouth as needed for anxiety.    Marland Kitchen diltiazem (CARDIZEM) 30 MG tablet Take 1 tablet (30 mg) by mouth up to three times a day as needed for a blood pressure > 150 (top number) 90 tablet 1  . diltiazem (TIAZAC) 300 MG 24 hr capsule Take 1 capsule (300 mg total) by mouth daily. 90 capsule 0  . docusate sodium (COLACE) 100 MG capsule Take by mouth. 3 in the morning and 2 at bedtime    . Evolocumab (REPATHA) 140 MG/ML SOSY Inject contents of pen twice a month 2.1 mL 3  . ezetimibe (ZETIA) 10 MG tablet TAKE 1 TABLET(10 MG) BY MOUTH DAILY 90 tablet 3  . famotidine (PEPCID) 40 MG tablet Take 40 mg by mouth 2 (two) times daily.    Marland Kitchen gabapentin (NEURONTIN) 300 MG capsule TAKE 1 CAPSULE(300 MG) BY MOUTH THREE TIMES DAILY 90 capsule 5  . glucose blood (CONTOUR NEXT TEST) test strip Use as instructed 100 each 12  . insulin detemir (LEVEMIR FLEXTOUCH) 100 UNIT/ML FlexPen ADMINISTER 51 UNITS UNDER THE SKIN EVERY DAY IN THE EVENING (Patient taking differently: 60 Units. ADMINISTER 60 UNITS UNDER THE SKIN EVERY DAY IN THE EVENING) 30 mL 2  . Insulin Pen Needle (B-D UF III MINI PEN NEEDLES) 31G X 5 MM MISC USE FOR INJECTIONS TWICE DAILY 100 each 12  . JARDIANCE 25 MG TABS tablet TAKE 1 TABLET(25 MG) BY MOUTH DAILY 30 tablet 2  . ketoconazole (NIZORAL) 2 % cream Apply to affected areas one to two times daily as needed. 60 g 2  . Lactobacillus-Inulin (CULTURELLE DIGESTIVE DAILY PO) Take 1 capsule by mouth daily.    Marland Kitchen lamoTRIgine  (LAMICTAL) 200 MG tablet Take 400 mg by mouth daily.    Marland Kitchen latanoprost (XALATAN) 0.005 % ophthalmic solution Place 1 drop into both eyes 2 (two) times a week.     Marland Kitchen LEVOXYL 125 MCG tablet Take 2 tablets (250 mcg total) by mouth daily before breakfast. 60 tablet 3  . linaclotide (LINZESS) 290 MCG CAPS capsule Take 1 capsule (290 mcg total) by mouth daily before breakfast. 30 capsule 6  . loperamide (IMODIUM A-D) 2 MG tablet Take 2 mg by mouth as needed for diarrhea or loose stools.    . meclizine (ANTIVERT) 25 MG tablet Take 25 mg by mouth 2 (two) times daily.    . Melatonin 10 MG CAPS Take 1 capsule by mouth at bedtime.    . mesalamine (LIALDA) 1.2 g EC tablet TAKE 2 TABLETS BY MOUTH DAILY 60  tablet 2  . Multiple Vitamin (MULTIVITAMIN) tablet Take 1 tablet by mouth daily.    . Omega-3 Krill Oil 500 MG CAPS Take 500 mg by mouth.    . ondansetron (ZOFRAN-ODT) 4 MG disintegrating tablet DISSOLVE 1 TABLET(4 MG) ON THE TONGUE EVERY 6 HOURS AS NEEDED FOR NAUSEA OR VOMITING 90 tablet 1  . pantoprazole (PROTONIX) 40 MG tablet TAKE 1 TABLET(40 MG) BY MOUTH TWICE DAILY 60 tablet 6  . potassium chloride (KLOR-CON) 10 MEQ tablet Take 1 tablet (10 mEq total) by mouth daily. 10 tablet 0  . predniSONE (DELTASONE) 10 MG tablet Take 4 tablets once a day for 2 days, 3 tablets once a day for 2 days, 2 tablets for 2 days and 1 tablet for 2 days 20 tablet 0  . Probiotic Product (PROBIOTIC DAILY PO) Take by mouth.    . QUEtiapine (SEROQUEL XR) 400 MG 24 hr tablet Take 800 mg by mouth at bedtime.    . Semaglutide, 1 MG/DOSE, (OZEMPIC, 1 MG/DOSE,) 4 MG/3ML SOPN INJECT $RemoveBef'1MG'MRsBfFxdpV$  INTO THE SKIN ONCE A WEEK 3 mL 3  . Simethicone (GAS-X PO) Take 1 tablet by mouth 4 (four) times daily.    . valACYclovir (VALTREX) 1000 MG tablet TAKE 1 TABLET(1000 MG) BY MOUTH DAILY 30 tablet 3  . vitamin C (ASCORBIC ACID) 500 MG tablet Take 250 mg by mouth daily. Takes 2 a day    . VYVANSE 50 MG capsule Take 45 mg by mouth every morning.    Marland Kitchen  VYVANSE 60 MG capsule Take 60 mg by mouth every morning.  0  . zaleplon (SONATA) 10 MG capsule Take 20 mg by mouth at bedtime as needed for sleep.    . nitroGLYCERIN (NITROSTAT) 0.4 MG SL tablet Place 1 tablet (0.4 mg total) under the tongue every 5 (five) minutes as needed for chest pain. (Patient not taking: Reported on 09/22/2019) 25 tablet 3   No current facility-administered medications on file prior to visit.    Review of Systems  Constitutional: Positive for fatigue. Negative for activity change, appetite change, fever and unexpected weight change.  HENT: Negative for congestion, ear pain, rhinorrhea, sinus pressure and sore throat.   Eyes: Negative for pain, redness and visual disturbance.  Respiratory: Negative for cough, shortness of breath and wheezing.   Cardiovascular: Negative for chest pain, palpitations and leg swelling.  Gastrointestinal: Negative for abdominal pain, blood in stool, constipation and diarrhea.  Endocrine: Negative for polydipsia and polyuria.  Genitourinary: Negative for dysuria, frequency and urgency.  Musculoskeletal: Positive for back pain. Negative for arthralgias and myalgias.  Skin: Negative for pallor and rash.  Allergic/Immunologic: Negative for environmental allergies.  Neurological: Negative for dizziness, syncope and headaches.  Hematological: Negative for adenopathy. Does not bruise/bleed easily.       Recently manic as part of bipolar    Psychiatric/Behavioral: Negative for decreased concentration and dysphoric mood. The patient is nervous/anxious.        Objective:   Physical Exam Constitutional:      General: She is not in acute distress.    Appearance: Normal appearance. She is well-developed and well-nourished. She is obese. She is not ill-appearing or diaphoretic.  HENT:     Head: Normocephalic and atraumatic.     Mouth/Throat:     Mouth: Oropharynx is clear and moist.  Eyes:     General: No scleral icterus.    Extraocular  Movements: EOM normal.     Conjunctiva/sclera: Conjunctivae normal.     Pupils: Pupils  are equal, round, and reactive to light.  Neck:     Thyroid: No thyromegaly.     Vascular: No carotid bruit or JVD.  Cardiovascular:     Rate and Rhythm: Regular rhythm. Tachycardia present.     Pulses: Intact distal pulses.     Heart sounds: Normal heart sounds. No gallop.   Pulmonary:     Effort: Pulmonary effort is normal. No respiratory distress.     Breath sounds: Normal breath sounds. No stridor. No wheezing, rhonchi or rales.     Comments: Good air exchange and No crackles Chest:     Chest wall: No tenderness.  Abdominal:     General: Bowel sounds are normal. There is no distension or abdominal bruit.     Palpations: Abdomen is soft. There is no mass.     Tenderness: There is no abdominal tenderness.  Musculoskeletal:        General: No edema.     Cervical back: Normal range of motion and neck supple. No tenderness.     Right lower leg: No edema.     Left lower leg: No edema.  Lymphadenopathy:     Cervical: No cervical adenopathy.  Skin:    General: Skin is warm and dry.     Coloration: Skin is not pale.     Findings: No erythema or rash.  Neurological:     Mental Status: She is alert.     Motor: No weakness.     Coordination: Coordination normal.     Deep Tendon Reflexes: Reflexes are normal and symmetric.  Psychiatric:        Mood and Affect: Mood and affect and mood normal.           Assessment & Plan:   Problem List Items Addressed This Visit      Cardiovascular and Mediastinum   Essential hypertension    bp in fair control at this time  BP Readings from Last 1 Encounters:  03/07/20 118/62   No changes needed Most recent labs reviewed  Disc lifstyle change with low sodium diet and exercise  Nl pulse today but has been low at home on diltiazem Pt plans to check in with cardiology about this Not lightheaded today        Endocrine   Hypothyroid    Lab today  for Dr Dwyane Dee      Uncontrolled type 2 diabetes mellitus with hyperglycemia, with long-term current use of insulin ([place])    Lab today for Dr Dwyane Dee        Other   Obesity (BMI 30-39.9)   Hypokalemia    K in the ER was 3.2 and was supplemented  Taking 10 meq daily(too 20 yesterday) Reassuring exam Pt takes linzess- ? If this could cause Lab today      Relevant Orders   Basic metabolic panel (Completed)   History of COVID-19 - Primary    With ER visit on 2/4 and now much better with prednisone  Reviewed hospital records, lab results and studies in detail   K was low -will re check with supplementation  No longer coughing and PCR test turned negative  Disc gradually inc activity and watching for sob or cp       Other Visit Diagnoses    Mixed hyperlipidemia

## 2020-03-07 NOTE — Assessment & Plan Note (Signed)
Lab today for Dr Dwyane Dee

## 2020-03-07 NOTE — Assessment & Plan Note (Signed)
With ER visit on 2/4 and now much better with prednisone  Reviewed hospital records, lab results and studies in detail   K was low -will re check with supplementation  No longer coughing and PCR test turned negative  Disc gradually inc activity and watching for sob or cp

## 2020-03-07 NOTE — Assessment & Plan Note (Signed)
bp in fair control at this time  BP Readings from Last 1 Encounters:  03/07/20 118/62   No changes needed Most recent labs reviewed  Disc lifstyle change with low sodium diet and exercise  Nl pulse today but has been low at home on diltiazem Pt plans to check in with cardiology about this Not lightheaded today

## 2020-03-07 NOTE — Assessment & Plan Note (Signed)
Seen incidentally on CT  H/o CAD Working to control HTN and lipids the best possible

## 2020-03-07 NOTE — Patient Instructions (Addendum)
Follow up with cardiology about the low pulse rate   Gradually increase your activity   Potassium level today  Also Dr Ronnie Derby labs  Lungs sound good   Think about a covid booster once done with the prednisone

## 2020-03-07 NOTE — Assessment & Plan Note (Addendum)
K in the ER was 3.2 and was supplemented  Taking 10 meq daily(too 20 yesterday) Reassuring exam Pt takes linzess- ? If this could cause Lab today

## 2020-03-08 ENCOUNTER — Telehealth: Payer: Self-pay | Admitting: *Deleted

## 2020-03-08 ENCOUNTER — Ambulatory Visit
Admission: RE | Admit: 2020-03-08 | Discharge: 2020-03-08 | Disposition: A | Discharge status: Home / Self Care | Payer: BC Managed Care – PPO | Source: Ambulatory Visit | Attending: Internal Medicine | Admitting: Internal Medicine

## 2020-03-08 DIAGNOSIS — Z170 Estrogen receptor positive status [ER+]: Secondary | ICD-10-CM | POA: Insufficient documentation

## 2020-03-08 DIAGNOSIS — Z780 Asymptomatic menopausal state: Secondary | ICD-10-CM | POA: Diagnosis not present

## 2020-03-08 DIAGNOSIS — C50812 Malignant neoplasm of overlapping sites of left female breast: Secondary | ICD-10-CM | POA: Diagnosis not present

## 2020-03-08 DIAGNOSIS — Z79811 Long term (current) use of aromatase inhibitors: Secondary | ICD-10-CM | POA: Diagnosis not present

## 2020-03-08 NOTE — Telephone Encounter (Signed)
-----   Message from Abner Greenspan, MD sent at 03/07/2020  8:11 PM EST ----- K level is fine, go ahead and stop it  Re check bmet 2-4 weeks to see if level stabilizes

## 2020-03-08 NOTE — Telephone Encounter (Signed)
Left VM requesting pt to call the office back 

## 2020-03-09 ENCOUNTER — Ambulatory Visit (INDEPENDENT_AMBULATORY_CARE_PROVIDER_SITE_OTHER): Payer: BC Managed Care – PPO | Admitting: Psychology

## 2020-03-09 DIAGNOSIS — F3131 Bipolar disorder, current episode depressed, mild: Secondary | ICD-10-CM | POA: Diagnosis not present

## 2020-03-09 NOTE — Telephone Encounter (Signed)
[place] Night - Client Nonclinical Telephone Record Access[facility]urse Client [place] Night - Client Client Site [facility], Roque Lias - MD Contact Type Call Who Is Calling Patient / Member / Family / Caregiver Caller Name [place] Phone Number [phone] Call Type Message Only Information Provided Reason for Call Returning a Call from the Office Initial [place] states she is returning a call regarding lab results. Additional Comment Provided office hours. Disp. Time Disposition Final User 03/08/2020 5:08:07 PM General Information Provided Yes Silvestre Moment Call Closed By: Silvestre Moment Transaction Date/Time: 03/08/2020 5:05:58 PM (ET)

## 2020-03-10 ENCOUNTER — Encounter: Payer: Self-pay | Admitting: Physician Assistant

## 2020-03-10 ENCOUNTER — Telehealth: Payer: Self-pay | Admitting: *Deleted

## 2020-03-10 ENCOUNTER — Ambulatory Visit (INDEPENDENT_AMBULATORY_CARE_PROVIDER_SITE_OTHER): Payer: BC Managed Care – PPO | Admitting: Physician Assistant

## 2020-03-10 ENCOUNTER — Telehealth: Payer: Self-pay

## 2020-03-10 ENCOUNTER — Other Ambulatory Visit: Payer: Self-pay

## 2020-03-10 VITALS — BP 140/80 | HR 86 | Ht 65.5 in | Wt 199.0 lb

## 2020-03-10 DIAGNOSIS — R42 Dizziness and giddiness: Secondary | ICD-10-CM

## 2020-03-10 DIAGNOSIS — I252 Old myocardial infarction: Secondary | ICD-10-CM

## 2020-03-10 DIAGNOSIS — I208 Other forms of angina pectoris: Secondary | ICD-10-CM

## 2020-03-10 DIAGNOSIS — R9431 Abnormal electrocardiogram [ECG] [EKG]: Secondary | ICD-10-CM

## 2020-03-10 DIAGNOSIS — I10 Essential (primary) hypertension: Secondary | ICD-10-CM

## 2020-03-10 DIAGNOSIS — I2510 Atherosclerotic heart disease of native coronary artery without angina pectoris: Secondary | ICD-10-CM | POA: Diagnosis not present

## 2020-03-10 DIAGNOSIS — E785 Hyperlipidemia, unspecified: Secondary | ICD-10-CM

## 2020-03-10 DIAGNOSIS — R0600 Dyspnea, unspecified: Secondary | ICD-10-CM

## 2020-03-10 DIAGNOSIS — I700 Atherosclerosis of aorta: Secondary | ICD-10-CM

## 2020-03-10 DIAGNOSIS — I498 Other specified cardiac arrhythmias: Secondary | ICD-10-CM

## 2020-03-10 DIAGNOSIS — Z8616 Personal history of COVID-19: Secondary | ICD-10-CM

## 2020-03-10 MED ORDER — METOPROLOL TARTRATE 100 MG PO TABS: 100.0000 mg | ORAL_TABLET | Freq: Once | ORAL | 0 refills | Status: DC

## 2020-03-10 MED ORDER — OLMESARTAN MEDOXOMIL 5 MG PO TABS: 10.0000 mg | ORAL_TABLET | Freq: Every day | ORAL | 5 refills | Status: DC

## 2020-03-10 NOTE — Progress Notes (Signed)
Office Visit    Patient Name: Lindsay Fry Date of Encounter: 03/10/2020  PCP:  Lindsay Greenspan, MD   [place]  Cardiologist:  Lindsay Rogue, MD  Advanced Practice Provider:  No care team member to display Electrophysiologist:  None   Chief Complaint    Chief Complaint  Patient presents with  . Other    [place] follow up - Meds reviewed verbally with patient.     With history of CAD s/p non-STEMI 08/2014, hypertension, hyperlipidemia, first-degree AV block, PACs, PVCs, HFpEF, bilateral carotid dz, COVID-19 02/18/2020, DM 2, hypothyroidism, bipolar disorder, anxiety, fibromyalgia, migraine disorder, left-sided breast cancer s/p bilateral mastectomy, prior history of smoking (quit 1995), and ulcerative colitis, and who presents for follow-up with [facility] visit.  Past Medical History    Past Medical History:  Diagnosis Date  . ADD (attention deficit disorder) 11/19/2012  . Allergic rhinitis, cause unspecified   . Anxiety state, unspecified   . Arthritis   . Asymptomatic postmenopausal status (age-related) (natural)   . Atypical hyperplasia of left breast 2000  . Atypical mole 03/16/2018   R paraspinal mid upper back, excision  . Atypical mole 02/17/2018   R spinal upper back, moderate  . Atypical mole 08/11/2017   L sternum, excision  . Atypical mole 05/27/2017   L post neck, moderate  . Benign neoplasm of other and unspecified site of the digestive system   . Bipolar disorder ([place])   . Breast cancer ([place])    Left Breast 25m invasive CA left uiq, dcis left uoq and a lot ADH, ALH in both breasts.  .Marland KitchenCAD (coronary artery disease)    a. 08/2014 NSTEMI/Cath: LM nl, LAD 50p, D1/D2 min irregs, LCX nl, OM1 40, LPDA nl, RI 99ost (2.038mvessel->med Rx), RCA nl, AM 60, nl EF; b. 11/2016 MV: EF 74%, no ischemia (performed 2/2 dyspnea & new inf TWI).  . Marland Kitchenarcinoma of left breast (HCRoberts5/19/2016  . Cataract   . Chronic diastolic CHF (congestive heart  failure) ([place]   a. 08/2014 Echo: EF 60-65%, Gr 1 DD, nl LA.  . Marland Kitchenlotting disorder (HCFisher   on blood thiner, Plavix  . Cyst    on Achilles tendon  . Depression   . Diabetes mellitus (HC[place]   frank  . Dysuria   . Edema   . Family history of malignant neoplasm of gastrointestinal tract   . Fibromyalgia   . GERD (gastroesophageal reflux disease)   . Glaucoma    pre glaucoma, 05/14/2018 pt. denies glaucoma  . Headache    migraines  . Hiatal hernia   . History of alcoholism (HC[place]  . History of migraines   . History of ovarian cyst   . Hx of adenomatous colonic polyps   . Insomnia, unspecified   . Myocardial infarction (HCCamp Wood8-21-16  . OCD (obsessive compulsive disorder)   . Osteopenia 09/25/2016   Femoral neck T -1.1  9/18  . Other screening mammogram   . Pure hypercholesterolemia   . Rosacea   . Subacute confusional state 11/19/2012  . Tubular adenoma of colon 01/10/11  . Ulcerative colitis, unspecified   . Unspecified asthma(493.90)   . Unspecified essential hypertension   . Unspecified hypothyroidism   . Unspecified vitamin D deficiency   . Vertigo    Past Surgical History:  Procedure Laterality Date  . APPENDECTOMY    . AXILLARY SENTINEL NODE BIOPSY Left 05/23/2014   Procedure: AXILLARY SENTINEL NODE  BIOPSY;  Surgeon: Lindsay Lye, MD;  Location: [facility] ORS;  Service: General;  Laterality: Left;  . BREAST BIOPSY  Aug 2000   on tamoxifen, atypical hyperplasia  . BREAST BIOPSY Left 04/2014  . BREAST SURGERY Left Aug 2000   lumpectomy/ Dr Sharlet Salina  . BREAST SURGERY Bilateral 05/23/14   Mastectomy  . BUNIONECTOMY Right   . CARDIAC CATHETERIZATION N/A 09/05/2014   Procedure: Left Heart Cath and Coronary Angiography;  Surgeon: Lindsay Hampshire, MD;  Location: [place] [facility];  Service: Cardiovascular;  Laterality: N/A;  . CARDIAC CATHETERIZATION  09-05-14  . CATARACT EXTRACTION W/ INTRAOCULAR LENS IMPLANT Bilateral   . CESAREAN SECTION  1996/1997   placenta  previa, gest DM, pre-eclampsia  . CHOLECYSTECTOMY  1997   adhesions also  . COLONOSCOPY  01/2001   Ulcerative colitis  . COLONOSCOPY  04/2004   UC, polyp  . COLONOSCOPY  12/08   UC, no polyps  . DEXA  04/1999 and 2010   normal  . ESOPHAGOGASTRODUODENOSCOPY  09/2001   polyp  . exercise stress test  11/2003   negative  . FEMUR FRACTURE SURGERY Left   . MASTECTOMY Bilateral   . MECKEL DIVERTICULUM EXCISION  1999  . NASAL SINUS SURGERY  05/1997  . nuclear stress test  06/2007   negative  . precancerous mole removed    . SIMPLE MASTECTOMY WITH AXILLARY SENTINEL NODE BIOPSY Bilateral 05/23/2014   Procedure: Bilateral simple mastectomy, left sentinel node biopsy ;  Surgeon: Lindsay Lye, MD;  Location: [facility] ORS;  Service: General;  Laterality: Bilateral;  . Sleep study  11/09   no apnea, but did snore (done by [facility])  . TONSILLECTOMY  1984  . TUBAL LIGATION    . WISDOM TOOTH EXTRACTION  1980's    Allergies  Allergies  Allergen Reactions  . Ephedrine Other (See Comments)    Pt becomes hyper  . Aspartame And Phenylalanine Nausea Only  . Aspirin     REACTION: aggrivates colitis  . Crestor [Rosuvastatin]     REACTION: increased lfts  . Erythromycin     REACTION: GI upset  . Nsaids     REACTION: aggrivate colitis  . Oxycodone Other (See Comments)    Panic Attack    History of Present Illness    Lindsay Fry is a 60 y.o. female with PMH as above.  She is a previous smoker, reportedly quitting in 1995.  She has history of non-STEMI 08/2014, hypertension, hyperlipidemia, HFpEF, DM 2, hypothyroidism, bipolar disorder, anxiety, fibromyalgia, migraine disorder, left-sided breast cancer s/p bilateral mastectomy, and ulcerative colitis, and is seen today for hospital follow-up.  Her cardiac history dates back to 2016, when she was admitted to the hospital with non-STEMI.  Catheterization showed severe ostial ramus intermedius disease, which was medically managed.  She  otherwise had nonobstructive CAD.  Echo showed EF 60 to 65%, G1 DD.  Lower extremity ABIs in 2016 normal.  Zio patch from 2018 showed predominantly NSR with average heart rate 76 bpm, first-degree AV block, rare PACs, atrial couplets, PVCs, and ventricular triplets.  No significant arrhythmias were noted.    Seen 10/2016 with dyspnea on exertion and was found to have new anterior T wave changes.  Subsequent 2018 nuclear stress test was without evidence of ischemia.    She later described chest pressure during her virtual visit in 2020.  This was attributed to stress and anxiety.  BP was elevated at the time and she was advised to  take short acting diltiazem as needed for SBP over 150 mmHg.  No further cardiac work-up was recommended.  She reportedly developed gait instability and memory disturbance late 2020 and was evaluated by neurology 01/2019.  MRI of the brain 02/2019 was unrevealing.  Carotid Dopplers normal 08/12/2019.   When seen last in clinic 12/2019, she noted increasing episodes of dizziness and double vision.  She saw her PCP 11/2019, who attributed her dizziness to vertigo and possible polypharmacy.  She was referred to ophthalmology and neurology.  It was noted at her last visit that she had almost weekly episodes where she felt as if the room was spinning.  She had fallen twice due to blurred vision.  Episodes lasted for approximately 30 minutes and resolved with meclizine.  Episodes were noted to be similar to those occurring in 2014.  She had elevated blood pressure at home x2 days with readings showing SBP 160s and DBP is 100.  She took as needed diltiazem 3 times overnight/over a period of 6 hours.  BP was noted to be elevated during clinic.  Diltiazem was therefore increased to 30 mg daily.  It was noted she was not on ASA secondary to colitis.  Referral was placed to PT for vestibular rehabilitation.  It was noted Repatha should be considered at follow-up.  She reportedly tested positive  for COVID-19 02/18/2020.  She reports a history of being vaccinated with [place] in June/2021.  On 03/02/2020, [facility] for symptoms of bradycardia, dyspnea, and presyncope.  She also noted some chest pain, rated mild in severity.  On review of EMR, she reported heart rate between 35 and 40 bpm over the last 48 hours.  She reported that symptoms started late 2/15 (Tuesday) afternoon and continued throughout the morning of 2/17.  She decided to present to the [facility] hospital 2/16 but subsequently left after realizing the [facility] ED was overcrowded.  She presented again 2/17 and was seen in the ED.  Initial vitals significant for BP well controlled at 123/76 and HR 93 bpm.  Labs showed potassium 2.9, glucose 137, hemoglobin 16.2, hematocrit 47.3.  High-sensitivity troponin negative.  EKG showed NSR with first-degree AV block, IVCD,  no significant ST/T wave abnormality.  Chest x-ray was without active cardiopulmonary disease.  Streaky atelectasis or scar was noted at the left base of her lung.  CT was without evidence of PE.  Cardiomegaly with coronary artery calcification was noted, as well as low lung volumes with mosaic appearance of the lung parenchyma bilaterally which may be secondary to low lung volumes versus small airway disease.  Aortic atherosclerosis is noted.  She received potassium repletion for her hypokalemia.  Given her low lung volumes, inhaler versus short course of steroid was discussed.  She was discharged with prescription for prednisone 40 mg tapering, albuterol, and potassium 10 M EQ daily.  Today, 03/10/2020, she returns to clinic in recalls her previous emergency department visits as outlined above.  She reports SOB with frequent use of the albuterol inhaler every 6 hours.  We discussed the rescue inhaler should not be used this frequently, especially as it will exacerbate any underlying tachycardia.  She reports chest tightness that does not feel similar to that felt  before her 2016 catheterization.  Chest tightness improves with her inhaler.  She has now run out of her inhaler, however, and is wondering if this can be replaced.  We discussed that she should follow-up with pulmonology and ideally avoid albuterol and  prednisone to prevent recurrent tachycardia.  She reports that she was coughing when lying on her back, though this has improved with taking prednisone.  She denies any syncope with her dizziness.  She denies any signs of volume overload.  No reported signs or symptoms of bleeding.  BP today noted to be elevated at 140/80 with HR 86 bpm (actually improved from previous clinic visit of 150/96).  Weight decreased from previous clinic weight at 207 pounds  199lbs.  Home Medications    Current Outpatient Medications on File Prior to Visit  Medication Sig Dispense Refill  . acetaminophen (TYLENOL) 650 MG CR tablet Take 1,300 mg by mouth 2 (two) times daily.    Marland Kitchen albuterol (VENTOLIN HFA) 108 (90 Base) MCG/ACT inhaler Inhale 1-2 puffs into the lungs every 6 (six) hours as needed for wheezing or shortness of breath. 8 g 1  . amantadine (SYMMETREL) 100 MG capsule Take 100 mg by mouth 2 (two) times daily.    Marland Kitchen atorvastatin (LIPITOR) 80 MG tablet TAKE 1 TABLET(80 MG) BY MOUTH DAILY 90 tablet 1  . BAYER MICROLET LANCETS lancets USE AS DIRECTED TO CHECK BLOOD SUGAR TWICE DAILY 200 each 2  . Bioflavonoid Products (VITAMIN C) CHEW Chew 1 tablet by mouth daily.    . Blood Glucose Monitoring Suppl (CONTOUR NEXT MONITOR) w/Device KIT 1 each by Does not apply route in the morning and at bedtime. Use Contour Next meter to check blood sugar twice daily. 1 kit 0  . Calcium Carb-Cholecalciferol 600-800 MG-UNIT TABS Take 1 tablet by mouth daily.    . Cholecalciferol (VITAMIN D3) 125 MCG (5000 UT) CAPS Take 1 capsule by mouth daily.    . clopidogrel (PLAVIX) 75 MG tablet TAKE 1 TABLET(75 MG) BY MOUTH DAILY 90 tablet 0  . co-enzyme Q-10 50 MG capsule Take 50 mg by mouth daily.     . Cyanocobalamin (VITAMIN B-12 PO) 3000 mcg-- 2 gummies daily    . Dextromethorphan-Guaifenesin 60-1200 MG 12hr tablet Take 1 tablet by mouth 2 (two) times daily.    . diazepam (VALIUM) 10 MG tablet Take 2.5-10 mg by mouth as needed for anxiety.    Marland Kitchen diltiazem (CARDIZEM) 30 MG tablet Take 1 tablet (30 mg) by mouth up to three times a day as needed for a blood pressure > 150 (top number) 90 tablet 1  . diltiazem (TIAZAC) 300 MG 24 hr capsule Take 1 capsule (300 mg total) by mouth daily. 90 capsule 0  . docusate sodium (COLACE) 100 MG capsule Take by mouth. 3 in the morning and 2 at bedtime    . Evolocumab (REPATHA) 140 MG/ML SOSY Inject contents of pen twice a month 2.1 mL 3  . ezetimibe (ZETIA) 10 MG tablet TAKE 1 TABLET(10 MG) BY MOUTH DAILY 90 tablet 3  . famotidine (PEPCID) 40 MG tablet Take 40 mg by mouth 2 (two) times daily.    Marland Kitchen gabapentin (NEURONTIN) 300 MG capsule TAKE 1 CAPSULE(300 MG) BY MOUTH THREE TIMES DAILY 90 capsule 5  . glucose blood (CONTOUR NEXT TEST) test strip Use as instructed 100 each 12  . insulin detemir (LEVEMIR FLEXTOUCH) 100 UNIT/ML FlexPen ADMINISTER 51 UNITS UNDER THE SKIN EVERY DAY IN THE EVENING 30 mL 2  . Insulin Pen Needle (B-D UF III MINI PEN NEEDLES) 31G X 5 MM MISC USE FOR INJECTIONS TWICE DAILY 100 each 12  . JARDIANCE 25 MG TABS tablet TAKE 1 TABLET(25 MG) BY MOUTH DAILY 30 tablet 2  . ketoconazole (NIZORAL) 2 %  cream Apply to affected areas one to two times daily as needed. 60 g 2  . Lactobacillus-Inulin (CULTURELLE DIGESTIVE DAILY PO) Take 1 capsule by mouth daily.    Marland Kitchen lamoTRIgine (LAMICTAL) 200 MG tablet Take 400 mg by mouth daily.    Marland Kitchen latanoprost (XALATAN) 0.005 % ophthalmic solution Place 1 drop into both eyes 2 (two) times a week.     Marland Kitchen LEVOXYL 125 MCG tablet Take 2 tablets (250 mcg total) by mouth daily before breakfast. 60 tablet 3  . linaclotide (LINZESS) 290 MCG CAPS capsule Take 1 capsule (290 mcg total) by mouth daily before breakfast. 30  capsule 6  . loperamide (IMODIUM A-D) 2 MG tablet Take 2 mg by mouth as needed for diarrhea or loose stools.    . meclizine (ANTIVERT) 25 MG tablet Take 25 mg by mouth 2 (two) times daily.    . Melatonin 10 MG CAPS Take 1 capsule by mouth at bedtime.    . mesalamine (LIALDA) 1.2 g EC tablet TAKE 2 TABLETS BY MOUTH DAILY 60 tablet 2  . Multiple Vitamin (MULTIVITAMIN) tablet Take 1 tablet by mouth daily.    . ondansetron (ZOFRAN-ODT) 4 MG disintegrating tablet DISSOLVE 1 TABLET(4 MG) ON THE TONGUE EVERY 6 HOURS AS NEEDED FOR NAUSEA OR VOMITING 90 tablet 1  . pantoprazole (PROTONIX) 40 MG tablet TAKE 1 TABLET(40 MG) BY MOUTH TWICE DAILY 60 tablet 6  . potassium chloride (KLOR-CON) 10 MEQ tablet Take 1 tablet (10 mEq total) by mouth daily. 10 tablet 0  . predniSONE (DELTASONE) 10 MG tablet Take 4 tablets once a day for 2 days, 3 tablets once a day for 2 days, 2 tablets for 2 days and 1 tablet for 2 days 20 tablet 0  . Probiotic Product (PROBIOTIC DAILY PO) Take by mouth.    . QUEtiapine (SEROQUEL XR) 400 MG 24 hr tablet Take 800 mg by mouth at bedtime.    . Semaglutide, 1 MG/DOSE, (OZEMPIC, 1 MG/DOSE,) 4 MG/3ML SOPN INJECT 1MG INTO THE SKIN ONCE A WEEK 3 mL 3  . Simethicone (GAS-X PO) Take 1 tablet by mouth 4 (four) times daily.    . valACYclovir (VALTREX) 1000 MG tablet TAKE 1 TABLET(1000 MG) BY MOUTH DAILY 30 tablet 3  . VRAYLAR capsule Take 3 mg by mouth at bedtime.    Marland Kitchen VYVANSE 50 MG capsule Take 50 mg by mouth every morning.    . zaleplon (SONATA) 10 MG capsule Take 20 mg by mouth at bedtime as needed for sleep.    . nitroGLYCERIN (NITROSTAT) 0.4 MG SL tablet Place 1 tablet (0.4 mg total) under the tongue every 5 (five) minutes as needed for chest pain. (Patient not taking: Reported on 09/22/2019) 25 tablet 3   No current facility-administered medications on file prior to visit.    Review of Systems    She denies palpitations, pnd, orthopnea, n, v, syncope, edema, weight gain, or early  satiety.  She reports an episode of bradycardic heart rates.  She reports chest tightness and dyspnea/shortness of breath. She has dizziness. She reports a previous cough that improved with her albuterol and prednisone.  She reports chest tightness also improves with albuterol and prednisone.  All other systems reviewed and are otherwise negative except as noted above.  Physical Exam    VS:  BP 140/80 (BP Location: Left Arm, Patient Position: Sitting, Cuff Size: Normal)   Pulse 86   Ht 5' 5.5" (1.664 m)   Wt 199 lb (90.3 kg)  LMP 08/23/2006 (Approximate) Comment: tubal ligation  SpO2 97%   BMI 32.61 kg/m  , BMI Body mass index is 32.61 kg/m. GEN: Well nourished, well developed, in no acute distress. HEENT: normal. Neck: Supple, no JVD, carotid bruits, or masses. Cardiac: RRR with extrasystole, no murmurs, rubs, or gallops. No clubbing, cyanosis, edema.  Radials/DP/PT 2+ and equal bilaterally.  Respiratory:  Respirations regular and unlabored, clear to auscultation bilaterally. GI: Soft, nontender, nondistended, BS + x 4. MS: no deformity or atrophy. Skin: warm and dry, no rash. Neuro:  Strength and sensation are intact. Psych: Normal affect.  Accessory Clinical Findings    ECG personally reviewed by me today - sinus rhythm with frequent PVCs/bigeminy, PACs, left axis deviation, IVCD with QRS 100 ms, PR interval prolonged at 196 ms, T wave inversion new in lateral leads I, V4 to V6, QTC 466, reviewed with DOD same day as T wave inversion in lateral leads is new- no acute changes.  VITALS Reviewed today   Temp Readings from Last 3 Encounters:  03/07/20 (!) 96.9 F (36.1 C) (Temporal)  03/02/20 98.1 F (36.7 C) (Oral)  03/01/20 98.2 F (36.8 C) (Oral)   BP Readings from Last 3 Encounters:  03/10/20 140/80  03/07/20 118/62  03/02/20 (!) 160/85   Pulse Readings from Last 3 Encounters:  03/10/20 86  03/07/20 98  03/02/20 80    Wt Readings from Last 3 Encounters:  03/10/20  199 lb (90.3 kg)  03/07/20 193 lb 6 oz (87.7 kg)  03/02/20 195 lb (88.5 kg)     LABS  reviewed today   Lab Results  Component Value Date   WBC 7.7 03/02/2020   HGB 16.2 (H) 03/02/2020   HCT 47.3 (H) 03/02/2020   MCV 90.4 03/02/2020   PLT 370 03/02/2020   Lab Results  Component Value Date   CREATININE 0.84 03/07/2020   BUN 19 03/07/2020   NA 142 03/07/2020   K 4.1 03/07/2020   CL 105 03/07/2020   CO2 28 03/07/2020   Lab Results  Component Value Date   ALT 43 (H) 09/06/2019   AST 36 09/06/2019   ALKPHOS 81 09/06/2019   BILITOT 0.6 09/06/2019   Lab Results  Component Value Date   CHOL 120 03/07/2020   HDL 40.30 03/07/2020   LDLCALC 46 03/07/2020   LDLDIRECT 125.0 02/07/2020   TRIG 172.0 (H) 03/07/2020   CHOLHDL 3 03/07/2020    Lab Results  Component Value Date   HGBA1C 6.7 (H) 03/07/2020   Lab Results  Component Value Date   TSH 2.87 03/07/2020     STUDIES/PROCEDURES reviewed today   US Carotid 07/2019 Summary:  Right Carotid: Velocities in the right ICA are consistent with a 1-39%  stenosis.  Vertebrals: Bilateral vertebral arteries demonstrate antegrade flow.  Subclavians: Normal flow hemodynamics were seen in bilateral subclavian        arteries.    2018 MPI Pharmacological myocardial perfusion imaging study with no significant  ischemia Normal wall motion, EF estimated at 74% No EKG changes concerning for ischemia at peak stress or in recovery. Low risk scan  2018 Ambulatory Cardiac monitoring Event monitor Normal sinus rhythm Min HR of 56 bpm, max HR of 133 bpm, and avg HR of 76 bpm.  First Degree AV Block was present. Isolated SVEs were rare (<1.0%), SVE Couplets were rare (<1.0%), and no SVE Triplets were present.  Isolated VEs were rare (<1.0%, 766), VE Triplets were rare (<1.0%, 1), and no VE Couplets were present.  2016 Echo - Left ventricle: The cavity size was normal. Systolic function was  normal. The estimated ejection  fraction was in the range of 60%  to 65%. Wall motion was normal; there were no regional wall  motion abnormalities. Doppler parameters are consistent with  abnormal left ventricular relaxation (grade 1 diastolic  dysfunction).  - Left atrium: The atrium was normal in size.  - Right ventricle: Systolic function was normal.  - Pulmonary arteries: Systolic pressure was within the normal  range.   2016 ABIs Normal ABIs  2016 LHC  Acute Mrg lesion, 60% stenosed.  Ost Ramus to Ramus lesion, 99% stenosed.  Prox LAD lesion, 50% stenosed.  1st Mrg lesion, 40% stenosed. 1. Severe one-vessel coronary artery disease. The culprit for myocardial infarction this subtotal occlusion of the ostial ramus. This vessel is medium size and the diameter is about 2 mm. 2. Normal LV systolic function and mildly elevated left ventricular end-diastolic pressure. Recommendations: The ostial ramus stenosis is in a location where placing a stent might compromise the flow to the LAD or left circumflex. Doing balloon angioplasty alone would probably not have good long-term results given that the diameter is about 2 mm. Thus, I think the best option is medical therapy for now. I increased the dose of atorvastatin and placed the patient on Brilinta to be taken for one year. Recommend aggressive control of risk factors and blood pressure control.   Assessment & Plan    Chest tightness  CAD s/p NSTEMI 08/2019 --No current chest pain.  She reports chest tightness that feels different from before her 2016 catheterization.  She also reports dizziness and dyspnea. EKG with new lateral TWI and ectopy. Suspect ectopy as the likely reason for her bradycardic readings at home, given this will throw off BP cuff rate measurements.  2016 cath with severe ostial ramus intermedius disease, managed medically.  2018 MPI without evidence of ischemia.  Given her chest tightness and dyspnea reported today, as well as her  frequent ectopy and new lateral T wave inversion on EKG, we will obtain a cardiac CT for further evaluation. Will also update echo to reassess EF, WM, and rule out acute structural changes.  Discussed plan and EKG with DOD with agreement in current plan.  Continue current medical therapy.  No ASA secondary to colitis.  She is on Plavix 75 mg daily.  Continue long-acting Cardizem 300 mg daily with as needed short acting Cardizem for SBP over 150.  Aggressive risk factor modification.  Frequent ectopy on EKG Pt Reported bradycardia --She reports dizziness and dyspnea with ectopy on EKG today.  She reports bradycardia before her most recent ED visit though elevated rates on arrival and suspect that ectopy. Prior cardiac monitoring as above with first-degree block, PACs, PVCs, but without arrhythmia. Suspect ectopy could be the reason for her bradycardic rate if bradycardic measurements at home (BP cuff measurement error due to ectopy).  Also considered is PRN short acting Cardizem and the rate at which she takes at home as previously noted.  Most recent TSH 2.87 and WNL.  ED hypokalemia resolved on repeat 2/22 labs. She is using an albuterol inhaler every 6 hours and currently on prednisone. Also started on Vyvanse.  Discussed that these medications could contribute to her symptoms and ectopy.   Recommended she reach out to her PCP or pulmonology regarding alternative medications to assist with her breathing status at this time.  Will update an echo to reassess EF, wall motion, and rule  out any acute structural changes given her reported symptoms.  As above, we will also obtain cardiac CT for further evaluation of her dyspnea and chest tightness, given her new T wave inversion on EKG.  If ongoing ectopy at RTC or further sx with workup above unrevealing, consider repeat ambulatory monitoring.  Will defer for now.   H/o hypokalemia, resolved --Prescribed KCl tab 38mq in the ED for hypokalemia.  Most recent 2/22  labs show potassium 4.1 and at goal.  Recommend repeat BMET at RTC, as electrolyte abnormalities could contribute to ectopy, and given starting Benicar today for BP and comorbid HTN and DM2.  Essential HTN, goal BP less than 130/80 --BP improved from previous clinic measurements at 140/80.  Recent labs show stable renal function and electrolytes on 2/22.  Continue current diltiazem and as needed diltiazem.  Continue home monitoring.  Recommend avoid OTC medications for cold and flu as they exacerbate elevated BP.  ARB / Benicar 161madded given elevated BP and comorbid hypertension and DM2.  Recheck BP and BMET at RTC and escalate Benicar dose if needed to optimize BP control.   Chronic HFpEF/G1DD --Euvolemic and well compensated on exam.  Reports SOB/dyspnea with frequent ectopy on EKG, which could likely be contributing to her symptoms.  Weight actually decreased from previous clinic visits and BP improved.  No indication for diuretic at this time.  Will update echo as above given recent sx to reassess EF, WM, and r/o acute structural changes. Continue current medications, including Benicar 10 mg daily, added today given comorbid hypertension and DM2 with elevated BP.  Most recent 2/22 renal function stable and will repeat BMET at RTC.  Hyperlipidemia, goal LDL less than 70 --Continue current PCSK9i versus statin and Zetia as all 3 are listed on her medications list. She reports she is no longer taking Repatha due to insurance issues.  2/22 labs show LDL 46 and total cholesterol 120.  Dizziness/vertigo --History of dizziness/vertigo/double vision.  Previously, PCP has thought vertigo possibly related to polypharmacy. She has started several new medications which should be considered and include albuterol, prednisone, OTC medications, and Vyvanse. MRI of the brain 02/2019 unrevealing.  Carotid Dopplers with mild disease 07/2019 as above.  PCP recommended ophthalmology and neurology follow-up.  She was  referred to vestibular rehabilitation as well.  She has continued on as needed meclizine.   Defer repeat carotids and Zio today as above.  Carotid artery disease, mild --Denies any signs or symptoms of worsening carotid artery disease.  No reported amaurosis fugax.  She does report dizziness.  At RTC, could consider repeat carotid studies.  Will defer for now and preference to first update echo and obtain cardiac CT as above.  Reassess at RTC.  Continue current statin/Zetia therapy.  DM2 --03/07/2020 A1c 6.7.  Ongoing glycemic control recommended for risk factor modification.  Managed per endocrinology.  Medication changes: Benicar 1036maily Labs ordered: BMET at RTC Studies / Imaging ordered: Echo, cardiac CT Future considerations: Reassess rx list and ensure not on any medications that could be contributing to her sx. Recommend avoid loperamide / wean prednisone / wean or discontinuealbuterol. Reassess K with repeat BMET at RTC. If no longer taking Repatha, this should be removed from her medication list.  Could consider Zio or carotids at RTC. Disposition: RTC 2 weeks   JacArvil ChacoA-C 03/10/2020

## 2020-03-10 NOTE — Telephone Encounter (Signed)
Pt called and requested to speak to me directly. Pt advise me that just left the cardiologist appt and they told her to stop her inhaler because it's "messing up" some of her heart functions. Pt said she still gets SOB when walking short distances and relayed on the inhaler to help. Pt is worried about stopping inhaler and what that will do for her breathing and wants Dr. Glori Bickers to prescribe her something else if she can't use her albuterol inhaler.   [facility].   I did advise pt she may have to look into this and it may be next week when she hears back from [place], pt was okay with that

## 2020-03-10 NOTE — Telephone Encounter (Signed)
LVM informing pt that she had 2 lab appts 4 days apart- we are canceling the one appt and doing all labs on Friday 3.11.2022 to save her from making 2 trips

## 2020-03-10 NOTE — Telephone Encounter (Signed)
Pt notified of lab results and Dr. Marliss Coots comments and f/u lab appt scheduled

## 2020-03-10 NOTE — Patient Instructions (Addendum)
Medication Instructions:  Your physician has recommended you make the following change in your medication:   START Benicar 7m DAILY - 592mtablet take 2 tablets daily  *If you need a refill on your cardiac medications before your next appointment, please call your pharmacy*   Lab Work:  We will check lab work at your next visit.   Testing/Procedures:  1) Your physician has requested that you have an echocardiogram. Echocardiography is a painless test that uses sound waves to create images of your heart. It provides your doctor with information about the size and shape of your heart and how well your heart's chambers and valves are working. This procedure takes approximately one hour. There are no restrictions for this procedure.  2) Cardiac CT  [facility] [phone] Medical Centerplease arrive 15 mins early for check-in and test prep.  Please follow these instructions carefully (unless otherwise directed):   On the Night Before the Test: . Be sure to Drink plenty of water. . Do not consume any caffeinated/decaffeinated beverages or chocolate 12 hours prior to your test. . Do not take any antihistamines 12 hours prior to your test.   On the Day of the Test: . Drink plenty of water until 1 hour prior to the test. . Do not eat any food 4 hours prior to the test. . You may take your regular medications prior to the test.  . Take metoprolol (Lopressor) two hours prior to test. . HOLD Furosemide/Hydrochlorothiazide morning of the test. . FEMALES- please wear underwire-free bra if available       After the Test: . Drink plenty of water. . After receiving IV contrast, you may experience a mild flushed feeling. This is normal. . On occasion, you may experience a mild rash up to 24 hours after the test. This is not dangerous. If this occurs, you can take Benadryl  25 mg and increase your fluid intake. . If you experience trouble breathing, this can be serious. If it is severe call 911 IMMEDIATELY. If it is mild, please call our office. . If you take any of these medications: Glipizide/Metformin, Avandament, Glucavance, please do not take 48 hours after completing test unless otherwise instructed.   Once we have confirmed authorization from your insurance company, we will call you to set up a date and time for your test. Based on how quickly your insurance processes prior authorizations requests, please allow up to 4 weeks to be contacted for scheduling your Cardiac CT appointment. Be advised that routine Cardiac CT appointments could be scheduled as many as 8 weeks after your provider has ordered it.  For non-scheduling related questions, please contact the cardiac imaging nurse navigator should you have any questions/concerns: SaMarchia BondCardiac Imaging Nurse Navigator MeGordy ClementCardiac Imaging Nurse Navigator [facility] Direct Office Dial: [phone] For scheduling needs, including cancellations and rescheduling, please call [phone]  Follow-Up: At [facility]you and your health needs are our priority.  As part of our continuing mission to provide you with exceptional heart care, we have created designated Provider Care Teams.  These Care Teams include your primary Cardiologist (physician) and Advanced Practice Providers (APPs -  Physician Assistants and Nurse Practitioners) who all work together to provide you with the care you need, when you need it.  We recommend signing up for the patient portal called "MyChart".  Sign up information is provided on this After Visit Summary.  MyChart is used to connect with patients for Virtual Visits (Telemedicine).  Patients are able to view lab/test results, encounter notes, upcoming appointments, etc.  Non-urgent messages can be sent to your provider as  well.   To learn more about what you can do with MyChart, go to [email or web address].    Your next appointment:   2 week(s) for repeat EKG/ check BP / labs  The format for your next appointment:   In Person  Provider:   You may see Ida Rogue, MD or one of the following Advanced Practice Providers on your designated Care Team:    Murray Hodgkins, NP  Christell Faith, PA-C  Marrianne Mood, PA-C  Cadence Kathlen Mody, [place]  Laurann Montana, NP    Other Instructions If you have worsening symptoms in the meantime, please go to the Emergency Room.

## 2020-03-12 NOTE — Telephone Encounter (Signed)
Does she have a formal diagnosis of asthma or reactive airways? I don't see it in the chart. Please ask if she wheezes or just gets sob Thanks

## 2020-03-13 ENCOUNTER — Encounter: Payer: Self-pay | Admitting: Family Medicine

## 2020-03-13 NOTE — Telephone Encounter (Signed)
Left VM letting pt know Dr. Marliss Coots comments

## 2020-03-13 NOTE — Telephone Encounter (Signed)
It may not be asthma (you have to have breathing tests to dx for sure)  I would like her to try going 1-2 d without inhaler just to see how she does (if she finds she really needs it then let us know)  Watch for wheezing and tight breathing   Thanks  Albuterol can increase heart rate but if she has to have it -that is different

## 2020-03-13 NOTE — Telephone Encounter (Signed)
Patient states PA at ED diagnosed her with Asthma on 2/17. She states that she does not have any wheezing but does have SOB with extended walking. She will cough some in the morning and and night. Was productive when she was using inhaler but now not productive at all.

## 2020-03-14 ENCOUNTER — Telehealth: Payer: Self-pay

## 2020-03-14 ENCOUNTER — Telehealth: Payer: Self-pay | Admitting: Endocrinology

## 2020-03-14 ENCOUNTER — Encounter: Payer: Self-pay | Admitting: Physician Assistant

## 2020-03-14 ENCOUNTER — Ambulatory Visit (INDEPENDENT_AMBULATORY_CARE_PROVIDER_SITE_OTHER): Payer: BC Managed Care – PPO | Admitting: Physician Assistant

## 2020-03-14 VITALS — BP 118/72 | HR 63 | Ht 65.5 in | Wt 196.0 lb

## 2020-03-14 DIAGNOSIS — K219 Gastro-esophageal reflux disease without esophagitis: Secondary | ICD-10-CM

## 2020-03-14 DIAGNOSIS — K5901 Slow transit constipation: Secondary | ICD-10-CM

## 2020-03-14 DIAGNOSIS — K5100 Ulcerative (chronic) pancolitis without complications: Secondary | ICD-10-CM

## 2020-03-14 DIAGNOSIS — Z86010 Personal history of colonic polyps: Secondary | ICD-10-CM | POA: Diagnosis not present

## 2020-03-14 DIAGNOSIS — Z800 Family history of malignant neoplasm of digestive organs: Secondary | ICD-10-CM | POA: Diagnosis not present

## 2020-03-14 DIAGNOSIS — Z7901 Long term (current) use of anticoagulants: Secondary | ICD-10-CM

## 2020-03-14 NOTE — Telephone Encounter (Signed)
Pt left v/m requesting cb from [place] CMA about inhaler rx. Pt saw provider and lungs still seem wheezy.(not sure what provider pt saw; was not in v/m).

## 2020-03-14 NOTE — Telephone Encounter (Signed)
[facility] Pre-operative Risk Assessment     Request for surgical clearance:     Endoscopy Procedure  What type of surgery is being performed?     Colonoscopy  When is this surgery scheduled?     05/02/20  What type of clearance is required ?   Pharmacy  Are there any medications that need to be held prior to surgery and how long? 5 days  Practice name and name of physician performing surgery?      [facility]  What is your office phone and fax number?      Phone- [phone]  [phone]  Anesthesia type (None, local, MAC, general) ?       MAC

## 2020-03-14 NOTE — Telephone Encounter (Signed)
-----   Message from Elayne Snare, MD sent at 03/12/2020  8:58 PM EST ----- Regarding: Appointment needed She does not have a follow-up, needs to be scheduled for mid April with labs

## 2020-03-14 NOTE — Patient Instructions (Addendum)
If you are age 62 or older, your body mass index should be between 23-30. Your Body mass index is 32.12 kg/m. If this is out of the aforementioned range listed, please consider follow up with your Primary Care Provider.  If you are age 71 or younger, your body mass index should be between 19-25. Your Body mass index is 32.12 kg/m. If this is out of the aformentioned range listed, please consider follow up with your Primary Care Provider.   You have been scheduled for a colonoscopy. Please follow written instructions given to you at your visit today.  Please pick up your prep supplies at the pharmacy within the next 1-3 days. If you use inhalers (even only as needed), please bring them with you on the day of your procedure.  Continue Pantoprazole, Linzess and Lialda   _  _   ORAL DIABETIC MEDICATION INSTRUCTIONS  The day before your procedure:  Take your diabetic pill as you do normally  The day of your procedure:  Do not take your diabetic pill   We will check your blood sugar levels during the admission process and again in Recovery before discharging you home  ______________________________________________________________________  _  _   INSULIN (LONG & INTERMEDIATE ACTING) MEDICATION INSTRUCTIONS (Lantus, NPH, Humulin 70/30, Novolin-R, Novolin-70/30, Levemir, Toujeo, Tresiba, Engineer, agricultural )   The day before your procedure:  Take  your regular evening dose    The day of your procedure:  Do not take your morning dose   Hold Ozempic morning of your procedure  Thank you for choosing me and [facility].  Ellouise Newer, PA-C   _

## 2020-03-14 NOTE — Progress Notes (Signed)
Plans for high risk surveillance colonoscopy noted

## 2020-03-14 NOTE — Progress Notes (Signed)
Chief Complaint: Screening for colorectal cancer in a patient on chronic anticoagulation  HPI:    Lindsay Fry is a 60 year old female with a past medical history as listed below including chronic ulcerative colitis, family history of colon cancer in her father, CAD on Plavix, known to Dr. Henrene Pastor, who was referred to me by Abner Greenspan, MD for consideration of a screening colonoscopy on chronic anticoagulation.      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  Finding of 2 5 mm polyps in the descending colon.  Pathology showed tubular adenomas.  Repeat was recommended in 2 years with more extensive prep.    Today, the patient presents to clinic and tells me that she is doing fairly well other than she had COVID from February 9 of the 16th.  She remains with some shortness of breath but GI wise is doing well with her reflux being maintained on Pantoprazole and Pepcid.  Constipation under control with Linzess and Lialda is treating her UC.  Reminds me that she needs a 2-day bowel prep.  Very familiar with colonoscopies and the procedure around them.    Denies fever, chills, weight loss, change in bowel habits, blood in her stool or symptoms that awaken her from sleep.  Past Medical History:  Diagnosis Date  . ADD (attention deficit disorder) 11/19/2012  . Allergic rhinitis, cause unspecified   . Anxiety state, unspecified   . Arthritis   . Asymptomatic postmenopausal status (age-related) (natural)   . Atypical hyperplasia of left breast 2000  . Atypical mole 03/16/2018   R paraspinal mid upper back, excision  . Atypical mole 02/17/2018   R spinal upper back, moderate  . Atypical mole 08/11/2017   L sternum, excision  . Atypical mole 05/27/2017   L post neck, moderate  . Benign neoplasm of other and unspecified site of the digestive system   . Bipolar disorder  ([place])   . Breast cancer ([place])    Left Breast 12m invasive CA left uiq, dcis left uoq and a lot ADH, ALH in both breasts.  .Marland KitchenCAD (coronary artery disease)    a. 08/2014 NSTEMI/Cath: LM nl, LAD 50p, D1/D2 min irregs, LCX nl, OM1 40, LPDA nl, RI 99ost (2.08mvessel->med Rx), RCA nl, AM 60, nl EF; b. 11/2016 MV: EF 74%, no ischemia (performed 2/2 dyspnea & new inf TWI).  . Marland Kitchenarcinoma of left breast (HCM[place]5/19/2016  . Cataract   . Chronic diastolic CHF (congestive heart failure) ([facility]   a. 08/2014 Echo: EF 60-65%, Gr 1 DD, nl LA.  . Marland Kitchenlotting disorder (HCIone   on blood thiner, Plavix  . Cyst    on Achilles tendon  . Depression   . Diabetes mellitus (HCMartin   frank  . Dysuria   . Edema   . Family history of malignant neoplasm of gastrointestinal tract   . Fibromyalgia   . GERD (gastroesophageal reflux disease)   . Glaucoma    pre glaucoma, 05/14/2018 pt. denies glaucoma  . Headache    migraines  . Hiatal hernia   . History of alcoholism (HCA[place]ale  . History of migraines   . History of ovarian cyst   . Hx of adenomatous colonic polyps   . Insomnia, unspecified   . Myocardial infarction ([place]CM[place]8-21-16  . OCD (obsessive compulsive disorder)   . Osteopenia 09/25/2016   Femoral  neck T -1.1  9/18  . Other screening mammogram   . Pure hypercholesterolemia   . Rosacea   . Subacute confusional state 11/19/2012  . Tubular adenoma of colon 01/10/11  . Ulcerative colitis, unspecified   . Unspecified asthma(493.90)   . Unspecified essential hypertension   . Unspecified hypothyroidism   . Unspecified vitamin D deficiency   . Vertigo     Past Surgical History:  Procedure Laterality Date  . APPENDECTOMY    . AXILLARY SENTINEL NODE BIOPSY Left 05/23/2014   Procedure: AXILLARY SENTINEL NODE BIOPSY;  Surgeon: Christene Lye, MD;  Location: [facility] ORS;  Service: General;  Laterality: Left;  . BREAST BIOPSY  Aug 2000   on tamoxifen, atypical hyperplasia  . BREAST BIOPSY Left 04/2014  . BREAST  SURGERY Left Aug 2000   lumpectomy/ Dr Sharlet Salina  . BREAST SURGERY Bilateral 05/23/14   Mastectomy  . BUNIONECTOMY Right   . CARDIAC CATHETERIZATION N/A 09/05/2014   Procedure: Left Heart Cath and Coronary Angiography;  Surgeon: Wellington Hampshire, MD;  Location: [facility] [facility];  Service: Cardiovascular;  Laterality: N/A;  . CARDIAC CATHETERIZATION  09-05-14  . CATARACT EXTRACTION W/ INTRAOCULAR LENS IMPLANT Bilateral   . CESAREAN SECTION  1996/1997   placenta previa, gest DM, pre-eclampsia  . CHOLECYSTECTOMY  1997   adhesions also  . COLONOSCOPY  01/2001   Ulcerative colitis  . COLONOSCOPY  04/2004   UC, polyp  . COLONOSCOPY  12/08   UC, no polyps  . DEXA  04/1999 and 2010   normal  . ESOPHAGOGASTRODUODENOSCOPY  09/2001   polyp  . exercise stress test  11/2003   negative  . FEMUR FRACTURE SURGERY Left   . MASTECTOMY Bilateral   . MECKEL DIVERTICULUM EXCISION  1999  . NASAL SINUS SURGERY  05/1997  . nuclear stress test  06/2007   negative  . precancerous mole removed    . SIMPLE MASTECTOMY WITH AXILLARY SENTINEL NODE BIOPSY Bilateral 05/23/2014   Procedure: Bilateral simple mastectomy, left sentinel node biopsy ;  Surgeon: Christene Lye, MD;  Location: [facility] ORS;  Service: General;  Laterality: Bilateral;  . Sleep study  11/09   no apnea, but did snore (done by [facility])  . TONSILLECTOMY  1984  . TUBAL LIGATION    . WISDOM TOOTH EXTRACTION  1980's    Current Outpatient Medications  Medication Sig Dispense Refill  . acetaminophen (TYLENOL) 650 MG CR tablet Take 1,300 mg by mouth 2 (two) times daily.    Marland Kitchen albuterol (VENTOLIN HFA) 108 (90 Base) MCG/ACT inhaler Inhale 1-2 puffs into the lungs every 6 (six) hours as needed for wheezing or shortness of breath. 8 g 1  . amantadine (SYMMETREL) 100 MG capsule Take 100 mg by mouth 2 (two) times daily.    Marland Kitchen atorvastatin (LIPITOR) 80 MG tablet TAKE 1 TABLET(80 MG) BY MOUTH DAILY 90 tablet 1  . BAYER MICROLET LANCETS lancets USE AS  DIRECTED TO CHECK BLOOD SUGAR TWICE DAILY 200 each 2  . Bioflavonoid Products (VITAMIN C) CHEW Chew 1 tablet by mouth daily.    . Blood Glucose Monitoring Suppl (CONTOUR NEXT MONITOR) w/Device KIT 1 each by Does not apply route in the morning and at bedtime. Use Contour Next meter to check blood sugar twice daily. 1 kit 0  . Calcium Carb-Cholecalciferol 600-800 MG-UNIT TABS Take 1 tablet by mouth daily.    . Cholecalciferol (VITAMIN D3) 125 MCG (5000 UT) CAPS Take 1 capsule by mouth daily.    Marland Kitchen  clopidogrel (PLAVIX) 75 MG tablet TAKE 1 TABLET(75 MG) BY MOUTH DAILY 90 tablet 0  . co-enzyme Q-10 50 MG capsule Take 50 mg by mouth daily.    . Cyanocobalamin (VITAMIN B-12 PO) 3000 mcg-- 2 gummies daily    . Dextromethorphan-Guaifenesin 60-1200 MG 12hr tablet Take 1 tablet by mouth 2 (two) times daily.    . diazepam (VALIUM) 10 MG tablet Take 2.5-10 mg by mouth as needed for anxiety.    Marland Kitchen diltiazem (CARDIZEM) 30 MG tablet Take 1 tablet (30 mg) by mouth up to three times a day as needed for a blood pressure > 150 (top number) 90 tablet 1  . diltiazem (TIAZAC) 300 MG 24 hr capsule Take 1 capsule (300 mg total) by mouth daily. 90 capsule 0  . docusate sodium (COLACE) 100 MG capsule Take by mouth. 3 in the morning and 2 at bedtime    . Evolocumab (REPATHA) 140 MG/ML SOSY Inject contents of pen twice a month 2.1 mL 3  . ezetimibe (ZETIA) 10 MG tablet TAKE 1 TABLET(10 MG) BY MOUTH DAILY 90 tablet 3  . famotidine (PEPCID) 40 MG tablet Take 40 mg by mouth 2 (two) times daily.    Marland Kitchen gabapentin (NEURONTIN) 300 MG capsule TAKE 1 CAPSULE(300 MG) BY MOUTH THREE TIMES DAILY 90 capsule 5  . glucose blood (CONTOUR NEXT TEST) test strip Use as instructed 100 each 12  . insulin detemir (LEVEMIR FLEXTOUCH) 100 UNIT/ML FlexPen ADMINISTER 51 UNITS UNDER THE SKIN EVERY DAY IN THE EVENING 30 mL 2  . Insulin Pen Needle (B-D UF III MINI PEN NEEDLES) 31G X 5 MM MISC USE FOR INJECTIONS TWICE DAILY 100 each 12  . JARDIANCE 25 MG  TABS tablet TAKE 1 TABLET(25 MG) BY MOUTH DAILY 30 tablet 2  . ketoconazole (NIZORAL) 2 % cream Apply to affected areas one to two times daily as needed. 60 g 2  . Lactobacillus-Inulin (CULTURELLE DIGESTIVE DAILY PO) Take 1 capsule by mouth daily.    Marland Kitchen lamoTRIgine (LAMICTAL) 200 MG tablet Take 400 mg by mouth daily.    Marland Kitchen latanoprost (XALATAN) 0.005 % ophthalmic solution Place 1 drop into both eyes 2 (two) times a week.     Marland Kitchen LEVOXYL 125 MCG tablet Take 2 tablets (250 mcg total) by mouth daily before breakfast. 60 tablet 3  . linaclotide (LINZESS) 290 MCG CAPS capsule Take 1 capsule (290 mcg total) by mouth daily before breakfast. 30 capsule 6  . loperamide (IMODIUM A-D) 2 MG tablet Take 2 mg by mouth as needed for diarrhea or loose stools.    . meclizine (ANTIVERT) 25 MG tablet Take 25 mg by mouth 2 (two) times daily.    . Melatonin 10 MG CAPS Take 1 capsule by mouth at bedtime.    . mesalamine (LIALDA) 1.2 g EC tablet TAKE 2 TABLETS BY MOUTH DAILY 60 tablet 2  . metoprolol tartrate (LOPRESSOR) 100 MG tablet Take 1 tablet (100 mg total) by mouth once for 1 dose. Take TWO hours prior to CT procedure 1 tablet 0  . Multiple Vitamin (MULTIVITAMIN) tablet Take 1 tablet by mouth daily.    . nitroGLYCERIN (NITROSTAT) 0.4 MG SL tablet Place 1 tablet (0.4 mg total) under the tongue every 5 (five) minutes as needed for chest pain. (Patient not taking: Reported on 09/22/2019) 25 tablet 3  . olmesartan (BENICAR) 5 MG tablet Take 2 tablets (10 mg total) by mouth daily. 60 tablet 5  . ondansetron (ZOFRAN-ODT) 4 MG disintegrating tablet DISSOLVE 1  TABLET(4 MG) ON THE TONGUE EVERY 6 HOURS AS NEEDED FOR NAUSEA OR VOMITING 90 tablet 1  . pantoprazole (PROTONIX) 40 MG tablet TAKE 1 TABLET(40 MG) BY MOUTH TWICE DAILY 60 tablet 6  . potassium chloride (KLOR-CON) 10 MEQ tablet Take 1 tablet (10 mEq total) by mouth daily. 10 tablet 0  . predniSONE (DELTASONE) 10 MG tablet Take 4 tablets once a day for 2 days, 3 tablets  once a day for 2 days, 2 tablets for 2 days and 1 tablet for 2 days 20 tablet 0  . Probiotic Product (PROBIOTIC DAILY PO) Take by mouth.    . QUEtiapine (SEROQUEL XR) 400 MG 24 hr tablet Take 800 mg by mouth at bedtime.    . Semaglutide, 1 MG/DOSE, (OZEMPIC, 1 MG/DOSE,) 4 MG/3ML SOPN INJECT 1MG INTO THE SKIN ONCE A WEEK 3 mL 3  . Simethicone (GAS-X PO) Take 1 tablet by mouth 4 (four) times daily.    . valACYclovir (VALTREX) 1000 MG tablet TAKE 1 TABLET(1000 MG) BY MOUTH DAILY 30 tablet 3  . VRAYLAR capsule Take 3 mg by mouth at bedtime.    Marland Kitchen VYVANSE 50 MG capsule Take 50 mg by mouth every morning.    . zaleplon (SONATA) 10 MG capsule Take 20 mg by mouth at bedtime as needed for sleep.     No current facility-administered medications for this visit.    Allergies as of 03/14/2020 - Review Complete 03/10/2020  Allergen Reaction Noted  . Ephedrine Other (See Comments) 06/27/2011  . Aspartame and phenylalanine Nausea Only 03/05/2016  . Aspirin  01/07/2007  . Crestor [rosuvastatin]  08/09/2009  . Erythromycin  01/07/2007  . Nsaids  01/07/2007  . Oxycodone Other (See Comments) 11/23/2014    Family History  Problem Relation Age of Onset  . Coronary artery disease Father   . Colon cancer Father   . Alzheimer's disease Father   . Dementia Father   . Colon polyps Father   . Coronary artery disease Mother   . Hypertension Mother   . Osteoporosis Mother   . Crohn's disease Mother        crohns colitis  . Breast cancer Other        great aunts  . Coronary artery disease Other        Uncle (also AAA)  . Diabetes Other        remote family history  . Stroke Cousin   . Esophageal cancer Neg Hx   . Rectal cancer Neg Hx   . Stomach cancer Neg Hx     Social History   Socioeconomic History  . Marital status: Married    Spouse name: alan  . Number of children: 2  . Years of education: college  . Highest education level: Not on file  Occupational History  . Occupation: unemployed   Tobacco Use  . Smoking status: Former Smoker    Packs/day: 0.25    Years: 5.00    Pack years: 1.25    Types: Cigarettes    Quit date: 01/14/1993    Years since quitting: 27.1  . Smokeless tobacco: Never Used  Vaping Use  . Vaping Use: Never used  Substance and Sexual Activity  . Alcohol use: No    Alcohol/week: 0.0 standard drinks    Comment: Recovered ETOH  . Drug use: No  . Sexual activity: Not Currently    Partners: Male  Other Topics Concern  . 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      Works-doing telephone triage   Social Determinants of Health   Financial Resource Strain: Not on file  Food Insecurity: Not on file  Transportation Needs: Not on file  Physical Activity: Not on file  Stress: Not on file  Social Connections: Not on file  Intimate Partner Violence: Not on file    Review of Systems:    Constitutional: No weight loss, fever or chills Cardiovascular: No chest pain Respiratory: No SOB  Gastrointestinal: See HPI and otherwise negative   Physical Exam:  Vital signs: BP 118/72   Pulse 63   Ht 5' 5.5" (1.664 m)   Wt 196 lb (88.9 kg)   LMP 08/23/2006 (Approximate) Comment: tubal ligation  BMI 32.12 kg/m   Constitutional:   Pleasant Caucasian female appears to be in NAD, Well developed, Well nourished, alert and cooperative Respiratory: Respirations even and unlabored. Lungs clear to auscultation bilaterally.   No wheezes, crackles, or rhonchi.  Cardiovascular: Normal S1, S2. No MRG. Regular rate and rhythm. No peripheral edema, cyanosis or pallor.  Gastrointestinal:  Soft, nondistended, nontender. No rebound or guarding. Normal bowel sounds. No appreciable masses or hepatomegaly. Rectal:  Not performed.  Psychiatric: Demonstrates good judgement and reason without abnormal affect or behaviors.  RELEVANT LABS AND IMAGING: CBC    Component Value  Date/Time   WBC 7.7 03/02/2020 1319   RBC 5.23 (H) 03/02/2020 1319   HGB 16.2 (H) 03/02/2020 1319   HCT 47.3 (H) 03/02/2020 1319   PLT 370 03/02/2020 1319   MCV 90.4 03/02/2020 1319   MCH 31.0 03/02/2020 1319   MCHC 34.2 03/02/2020 1319   RDW 13.5 03/02/2020 1319   LYMPHSABS 1.8 08/30/2019 1437   MONOABS 0.6 08/30/2019 1437   EOSABS 0.2 08/30/2019 1437   BASOSABS 0.1 08/30/2019 1437    CMP     Component Value Date/Time   NA 142 03/07/2020 0937   K 4.1 03/07/2020 0937   CL 105 03/07/2020 0937   CO2 28 03/07/2020 0937   GLUCOSE 124 (H) 03/07/2020 0937   BUN 19 03/07/2020 0937   CREATININE 0.84 03/07/2020 0937   CALCIUM 9.9 03/07/2020 0937   PROT 6.4 09/06/2019 1050   ALBUMIN 4.3 09/06/2019 1050   AST 36 09/06/2019 1050   ALT 43 (H) 09/06/2019 1050   ALKPHOS 81 09/06/2019 1050   BILITOT 0.6 09/06/2019 1050   GFRNONAA >60 03/02/2020 1319   GFRAA >60 08/30/2019 1437    Assessment: 1.  Chronic ulcerative colitis: Maintained on Lialda 2.  History of multiple adenomatous colon polyps and sessile serrated polyps: Surveillance recommended 04/2020 3.  Family history of colon cancer in her father 63.  GERD: Maintained on a PPI 5.  Chronic constipation: On Linzess 6.  CAD on Plavix  Plan: 1.  Scheduled patient for surveillance colonoscopy with Dr. Henrene Pastor in the [facility].  Patient was provided with a detailed list of risks for the procedure and she agrees to proceed.  She has had her COVID vaccines.  This will be done with a 2-day bowel preparation. 2.  Patient was advised to hold her Plavix for 5 days prior to her procedure.  We will communicate with her prescribing physician to ensure that holding her Plavix is acceptable for her. 3.  Continue Lialda and Linzess and Pantoprazole 4.  Patient to follow in clinic per recommendations from Dr. Henrene Pastor after  time of procedure.  Ellouise Newer, PA-C [facility] 03/14/2020, 10:59 AM  Cc: Tower, Wynelle Fanny, MD

## 2020-03-14 NOTE — Telephone Encounter (Signed)
Will route to PharmD for rec's re: holding anticoagulation. Richardson Dopp, PA-C    03/14/2020 5:18 PM

## 2020-03-14 NOTE — Telephone Encounter (Signed)
LMTCB to schedule appt

## 2020-03-14 NOTE — Telephone Encounter (Signed)
Pt received my message and responded via mychart. Please see pt's comments in her mychart message

## 2020-03-16 ENCOUNTER — Ambulatory Visit (INDEPENDENT_AMBULATORY_CARE_PROVIDER_SITE_OTHER): Payer: BC Managed Care – PPO

## 2020-03-16 ENCOUNTER — Other Ambulatory Visit: Payer: Self-pay

## 2020-03-16 DIAGNOSIS — R9431 Abnormal electrocardiogram [ECG] [EKG]: Secondary | ICD-10-CM

## 2020-03-16 DIAGNOSIS — Z8616 Personal history of COVID-19: Secondary | ICD-10-CM | POA: Diagnosis not present

## 2020-03-16 DIAGNOSIS — R0600 Dyspnea, unspecified: Secondary | ICD-10-CM | POA: Diagnosis not present

## 2020-03-16 LAB — ECHOCARDIOGRAM COMPLETE
AR max vel: 2.65 cm2
AV Area VTI: 2.89 cm2
AV Area mean vel: 2.77 cm2
AV Mean grad: 4 mmHg
AV Peak grad: 8 mmHg
Ao pk vel: 1.41 m/s
Area-P 1/2: 5.34 cm2
S' Lateral: 3 cm

## 2020-03-16 MED ORDER — PERFLUTREN LIPID MICROSPHERE
1.0000 mL | INTRAVENOUS | Status: AC | PRN
  Administered 2020-03-16: 2 mL via INTRAVENOUS

## 2020-03-17 ENCOUNTER — Ambulatory Visit: Payer: BC Managed Care – PPO | Admitting: Cardiovascular Disease

## 2020-03-17 ENCOUNTER — Telehealth: Payer: Self-pay | Admitting: *Deleted

## 2020-03-17 DIAGNOSIS — I493 Ventricular premature depolarization: Secondary | ICD-10-CM

## 2020-03-17 DIAGNOSIS — I471 Supraventricular tachycardia: Secondary | ICD-10-CM

## 2020-03-17 NOTE — Telephone Encounter (Signed)
   Primary Cardiologist: Ida Rogue, MD  Chart reviewed as part of pre-operative protocol coverage.Miss NAVEH RICKLES has hx of CAD. Due to worsening shortness of breath at clinic visit 03/10/20, she was recommended for cardiac CTA which is scheduled for 03/23/20.  If no evidence of worsening ischemia, anticipate it will be appropriate to hold Plavix.   If evidence of new or worsening ischemia, will likely require catheterization which recommend prioritizing over surveillance colonoscopy.   Will await cardiac CTA 03/23/20 prior to making recommendations.   Loel Dubonnet, NP 03/17/2020, 12:12 PM

## 2020-03-17 NOTE — Telephone Encounter (Signed)
-----   Message from Arvil Chaco, PA-C sent at 03/17/2020  7:53 AM EST ----- Echo shows  --Low normal pump function of 50 to 55%. --Walls of the heart are moving normally.  Heart squeeze normal. --Bottom left chamber of the heart is slightly stiff, which was seen on her 2016 study. --Mildly leaky tricuspid valve, which is not concerning. --Frequent PVCs noted, which are extra beats from the bottom part of the heart.  Recommendation: Repeat cardiac monitoring with Zio XT for 2 weeks, in addition to the workup ordered during clinic visit. We can discuss this echo at our upcoming visit as well.

## 2020-03-17 NOTE — Telephone Encounter (Signed)
Attempted to call pt to review results and provider's recc.  No answer. Lmtcb.

## 2020-03-19 ENCOUNTER — Telehealth: Payer: Self-pay | Admitting: Family Medicine

## 2020-03-19 DIAGNOSIS — E1169 Type 2 diabetes mellitus with other specified complication: Secondary | ICD-10-CM

## 2020-03-19 DIAGNOSIS — I10 Essential (primary) hypertension: Secondary | ICD-10-CM

## 2020-03-19 DIAGNOSIS — E890 Postprocedural hypothyroidism: Secondary | ICD-10-CM

## 2020-03-19 DIAGNOSIS — E876 Hypokalemia: Secondary | ICD-10-CM

## 2020-03-19 DIAGNOSIS — E785 Hyperlipidemia, unspecified: Secondary | ICD-10-CM

## 2020-03-19 DIAGNOSIS — Z79899 Other long term (current) drug therapy: Secondary | ICD-10-CM

## 2020-03-19 DIAGNOSIS — E559 Vitamin D deficiency, unspecified: Secondary | ICD-10-CM

## 2020-03-19 DIAGNOSIS — E1159 Type 2 diabetes mellitus with other circulatory complications: Secondary | ICD-10-CM

## 2020-03-19 NOTE — Telephone Encounter (Signed)
-----   Message from [name] sent at 03/06/2020 10:00 AM EST ----- Regarding: lab orders for Monday 3.7.22 Patient is scheduled for CPX labs, please order future labs, Thanks , Karna Christmas

## 2020-03-20 ENCOUNTER — Inpatient Hospital Stay: Payer: BC Managed Care – PPO | Admitting: Internal Medicine

## 2020-03-20 ENCOUNTER — Inpatient Hospital Stay: Payer: BC Managed Care – PPO

## 2020-03-20 ENCOUNTER — Telehealth: Payer: Self-pay | Admitting: Internal Medicine

## 2020-03-20 ENCOUNTER — Ambulatory Visit (INDEPENDENT_AMBULATORY_CARE_PROVIDER_SITE_OTHER): Payer: BC Managed Care – PPO

## 2020-03-20 ENCOUNTER — Other Ambulatory Visit: Payer: BC Managed Care – PPO

## 2020-03-20 DIAGNOSIS — Z03818 Encounter for observation for suspected exposure to other biological agents ruled out: Secondary | ICD-10-CM | POA: Diagnosis not present

## 2020-03-20 DIAGNOSIS — I471 Supraventricular tachycardia: Secondary | ICD-10-CM

## 2020-03-20 DIAGNOSIS — Z20822 Contact with and (suspected) exposure to covid-19: Secondary | ICD-10-CM | POA: Diagnosis not present

## 2020-03-20 DIAGNOSIS — I493 Ventricular premature depolarization: Secondary | ICD-10-CM

## 2020-03-20 NOTE — Telephone Encounter (Signed)
Patient called to report that she has a "runny nose and cough" and wants to cancel her appointment today. She is unsure whether it is COVID or allergies. She would like to call back to reschedule.

## 2020-03-20 NOTE — Telephone Encounter (Signed)
Spoke to pt. Notified of echo result and provider's recc.  Pt verbalized understanding. Does agree to wear monitor again for 2 weeks.  Pt has her cardiac ct scheduled this week on 3/10 and follow up in clinic next week 3/16.  Reviewed details of zio monitor again with pt and she voices understanding and will call zio or our office with any questions.  Home address confirmed. ZioXT ordered to be mailed to pt.

## 2020-03-20 NOTE — Telephone Encounter (Signed)
Pt is going to call back after her heart issues is resolved she will know later next week what is going on. She is getting tested today for symptoms she is having.

## 2020-03-20 NOTE — Telephone Encounter (Signed)
Patient returning call, please advise when able. Patient stated she would be unavailable between 11-11:30

## 2020-03-21 ENCOUNTER — Telehealth (INDEPENDENT_AMBULATORY_CARE_PROVIDER_SITE_OTHER): Payer: BC Managed Care – PPO | Admitting: Family Medicine

## 2020-03-21 DIAGNOSIS — R0981 Nasal congestion: Secondary | ICD-10-CM

## 2020-03-21 DIAGNOSIS — R059 Cough, unspecified: Secondary | ICD-10-CM | POA: Diagnosis not present

## 2020-03-21 MED ORDER — BENZONATATE 100 MG PO CAPS: 100.0000 mg | ORAL_CAPSULE | Freq: Three times a day (TID) | ORAL | 0 refills | Status: DC | PRN

## 2020-03-21 NOTE — Patient Instructions (Signed)
-  I sent the medication(s) we discussed to your pharmacy: Meds ordered this encounter  Medications  . benzonatate (TESSALON PERLES) 100 MG capsule    Sig: Take 1 capsule (100 mg total) by mouth 3 (three) times daily as needed.    Dispense:  20 capsule    Refill:  0   -nasal saline twice a day  -could try flonase which could help if this is seasonal allergies  -stay away from dairy products  I hope you are feeling better soon!  Seek in person care promptly if your symptoms worsen, new concerns arise or you are not improving with treatment.  It was nice to meet you today. I help [facility] out with telemedicine visits on Tuesdays and Thursdays and am available for visits on those days. If you have any concerns or questions following this visit please schedule a follow up visit with your Primary Care doctor or seek care at a local urgent care clinic to avoid delays in care.

## 2020-03-21 NOTE — Progress Notes (Signed)
Virtual Visit via Video Note  I connected with [name]  on 03/21/20 at  4:20 PM EST by a video enabled telemedicine application and verified that I am speaking with the correct person using two identifiers.  Location patient: home, [place] Location provider:work or home office Persons participating in the virtual visit: patient, provider  I discussed the limitations of evaluation and management by telemedicine and the availability of in person appointments. The patient expressed understanding and agreed to proceed.   HPI:  Acute telemedicine visit for cough and congestion: -Onset: about 4 days ago -Symptoms include: clear nasal congestion, cough ->96% -she did a covid test yesterday which was negative -Denies:fever, CP, SOB, NVD, body aches, inability to eat/drink/get out of bed -Has tried:OTC cough medication has been great, steam -Pertinent past medical history: seasonal allergies, had covid about 1 month ago; was seen in ER 02/2015 and required alb and prednisone and symptoms resolved; CAD, UC, DM -Pertinent medication allergies: asa, crestor, ? aspartame, erythromycin, nsaids, oxycodone -COVID-19 vaccine status: vaccinated x 2  ROS: See pertinent positives and negatives per HPI.  Past Medical History:  Diagnosis Date  . ADD (attention deficit disorder) 11/19/2012  . Allergic rhinitis, cause unspecified   . Anxiety state, unspecified   . Arthritis   . Asymptomatic postmenopausal status (age-related) (natural)   . Atypical hyperplasia of left breast 2000  . Atypical mole 03/16/2018   R paraspinal mid upper back, excision  . Atypical mole 02/17/2018   R spinal upper back, moderate  . Atypical mole 08/11/2017   L sternum, excision  . Atypical mole 05/27/2017   L post neck, moderate  . Benign neoplasm of other and unspecified site of the digestive system   . Bipolar disorder ([place])   . Breast cancer ([place])    Left Breast 36m invasive CA left uiq, dcis left uoq and a lot ADH, ALH in  both breasts.  .Marland KitchenCAD (coronary artery disease)    a. 08/2014 NSTEMI/Cath: LM nl, LAD 50p, D1/D2 min irregs, LCX nl, OM1 40, LPDA nl, RI 99ost (2.06mvessel->med Rx), RCA nl, AM 60, nl EF; b. 11/2016 MV: EF 74%, no ischemia (performed 2/2 dyspnea & new inf TWI).  . Marland Kitchenarcinoma of left breast ([facility]5/19/2016  . Cataract   . Chronic diastolic CHF (congestive heart failure) ([facility]   a. 08/2014 Echo: EF 60-65%, Gr 1 DD, nl LA.  . Marland Kitchenlotting disorder ([place]   on blood thiner, Plavix  . Cyst    on Achilles tendon  . Depression   . Diabetes mellitus ([facility]   frank  . Dysuria   . Edema   . Family history of malignant neoplasm of gastrointestinal tract   . Fibromyalgia   . GERD (gastroesophageal reflux disease)   . Glaucoma    pre glaucoma, 05/14/2018 pt. denies glaucoma  . Headache    migraines  . Hiatal hernia   . History of alcoholism ([facility]  . History of migraines   . History of ovarian cyst   . Hx of adenomatous colonic polyps   . Insomnia, unspecified   . Myocardial infarction ([facility]8-21-16  . OCD (obsessive compulsive disorder)   . Osteopenia 09/25/2016   Femoral neck T -1.1  9/18  . Other screening mammogram   . Pure hypercholesterolemia   . Rosacea   . Subacute confusional state 11/19/2012  . Tubular adenoma of colon 01/10/11  . Ulcerative colitis, unspecified   . Unspecified asthma(493.90)   . Unspecified essential hypertension   .  Unspecified hypothyroidism   . Unspecified vitamin D deficiency   . Vertigo     Past Surgical History:  Procedure Laterality Date  . APPENDECTOMY    . AXILLARY SENTINEL NODE BIOPSY Left 05/23/2014   Procedure: AXILLARY SENTINEL NODE BIOPSY;  Surgeon: Christene Lye, MD;  Location: [facility] ORS;  Service: General;  Laterality: Left;  . BREAST BIOPSY  Aug 2000   on tamoxifen, atypical hyperplasia  . BREAST BIOPSY Left 04/2014  . BREAST SURGERY Left Aug 2000   lumpectomy/ Dr Sharlet Salina  . BREAST SURGERY Bilateral 05/23/14   Mastectomy  .  BUNIONECTOMY Right   . CARDIAC CATHETERIZATION N/A 09/05/2014   Procedure: Left Heart Cath and Coronary Angiography;  Surgeon: Wellington Hampshire, MD;  Location: [facility];  Service: Cardiovascular;  Laterality: N/A;  . CARDIAC CATHETERIZATION  09-05-14  . CATARACT EXTRACTION W/ INTRAOCULAR LENS IMPLANT Bilateral   . CESAREAN SECTION  1996/1997   placenta previa, gest DM, pre-eclampsia  . CHOLECYSTECTOMY  1997   adhesions also  . COLONOSCOPY  01/2001   Ulcerative colitis  . COLONOSCOPY  04/2004   UC, polyp  . COLONOSCOPY  12/08   UC, no polyps  . DEXA  04/1999 and 2010   normal  . ESOPHAGOGASTRODUODENOSCOPY  09/2001   polyp  . exercise stress test  11/2003   negative  . FEMUR FRACTURE SURGERY Left   . MASTECTOMY Bilateral   . MECKEL DIVERTICULUM EXCISION  1999  . NASAL SINUS SURGERY  05/1997  . nuclear stress test  06/2007   negative  . precancerous mole removed    . SIMPLE MASTECTOMY WITH AXILLARY SENTINEL NODE BIOPSY Bilateral 05/23/2014   Procedure: Bilateral simple mastectomy, left sentinel node biopsy ;  Surgeon: Christene Lye, MD;  Location: [facility] ORS;  Service: General;  Laterality: Bilateral;  . Sleep study  11/09   no apnea, but did snore (done by [facility])  . TONSILLECTOMY  1984  . TUBAL LIGATION    . WISDOM TOOTH EXTRACTION  1980's     Current Outpatient Medications:  .  benzonatate (TESSALON PERLES) 100 MG capsule, Take 1 capsule (100 mg total) by mouth 3 (three) times daily as needed., Disp: 20 capsule, Rfl: 0 .  acetaminophen (TYLENOL) 650 MG CR tablet, Take 1,300 mg by mouth 2 (two) times daily., Disp: , Rfl:  .  amantadine (SYMMETREL) 100 MG capsule, Take 100 mg by mouth 2 (two) times daily., Disp: , Rfl:  .  atorvastatin (LIPITOR) 80 MG tablet, TAKE 1 TABLET(80 MG) BY MOUTH DAILY, Disp: 90 tablet, Rfl: 1 .  BAYER MICROLET LANCETS lancets, USE AS DIRECTED TO CHECK BLOOD SUGAR TWICE DAILY, Disp: 200 each, Rfl: 2 .  Bioflavonoid Products (VITAMIN C)  CHEW, Chew 1 tablet by mouth daily., Disp: , Rfl:  .  Blood Glucose Monitoring Suppl (CONTOUR NEXT MONITOR) w/Device KIT, 1 each by Does not apply route in the morning and at bedtime. Use Contour Next meter to check blood sugar twice daily., Disp: 1 kit, Rfl: 0 .  Calcium Carb-Cholecalciferol 600-800 MG-UNIT TABS, Take 1 tablet by mouth daily., Disp: , Rfl:  .  Cholecalciferol (VITAMIN D3) 125 MCG (5000 UT) CAPS, Take 1 capsule by mouth daily., Disp: , Rfl:  .  clopidogrel (PLAVIX) 75 MG tablet, TAKE 1 TABLET(75 MG) BY MOUTH DAILY, Disp: 90 tablet, Rfl: 0 .  co-enzyme Q-10 50 MG capsule, Take 50 mg by mouth daily., Disp: , Rfl:  .  Cyanocobalamin (VITAMIN B-12 PO),  3000 mcg-- 2 gummies daily, Disp: , Rfl:  .  diazepam (VALIUM) 10 MG tablet, Take 2.5-10 mg by mouth as needed for anxiety., Disp: , Rfl:  .  diltiazem (TIAZAC) 300 MG 24 hr capsule, Take 1 capsule (300 mg total) by mouth daily., Disp: 90 capsule, Rfl: 0 .  docusate sodium (COLACE) 100 MG capsule, Take by mouth. 3 in the morning and 2 at bedtime, Disp: , Rfl:  .  Evolocumab (REPATHA) 140 MG/ML SOSY, Inject contents of pen twice a month, Disp: 2.1 mL, Rfl: 3 .  ezetimibe (ZETIA) 10 MG tablet, TAKE 1 TABLET(10 MG) BY MOUTH DAILY, Disp: 90 tablet, Rfl: 3 .  famotidine (PEPCID) 40 MG tablet, Take 40 mg by mouth 2 (two) times daily., Disp: , Rfl:  .  gabapentin (NEURONTIN) 300 MG capsule, TAKE 1 CAPSULE(300 MG) BY MOUTH THREE TIMES DAILY, Disp: 90 capsule, Rfl: 5 .  glucose blood (CONTOUR NEXT TEST) test strip, Use as instructed, Disp: 100 each, Rfl: 12 .  insulin detemir (LEVEMIR FLEXTOUCH) 100 UNIT/ML FlexPen, ADMINISTER 51 UNITS UNDER THE SKIN EVERY DAY IN THE EVENING (Patient taking differently: ADMINISTER 60 UNITS UNDER THE SKIN EVERY DAY IN THE EVENING), Disp: 30 mL, Rfl: 2 .  Insulin Pen Needle (B-D UF III MINI PEN NEEDLES) 31G X 5 MM MISC, USE FOR INJECTIONS TWICE DAILY, Disp: 100 each, Rfl: 12 .  JARDIANCE 25 MG TABS tablet, TAKE 1  TABLET(25 MG) BY MOUTH DAILY, Disp: 30 tablet, Rfl: 2 .  ketoconazole (NIZORAL) 2 % cream, Apply to affected areas one to two times daily as needed., Disp: 60 g, Rfl: 2 .  Lactobacillus-Inulin (CULTURELLE DIGESTIVE DAILY PO), Take 1 capsule by mouth daily., Disp: , Rfl:  .  lamoTRIgine (LAMICTAL) 200 MG tablet, Take 400 mg by mouth daily., Disp: , Rfl:  .  latanoprost (XALATAN) 0.005 % ophthalmic solution, Place 1 drop into both eyes 2 (two) times a week. , Disp: , Rfl:  .  LEVOXYL 125 MCG tablet, Take 2 tablets (250 mcg total) by mouth daily before breakfast., Disp: 60 tablet, Rfl: 3 .  linaclotide (LINZESS) 290 MCG CAPS capsule, Take 1 capsule (290 mcg total) by mouth daily before breakfast., Disp: 30 capsule, Rfl: 6 .  loperamide (IMODIUM A-D) 2 MG tablet, Take 2 mg by mouth as needed for diarrhea or loose stools., Disp: , Rfl:  .  Melatonin 10 MG CAPS, Take 1 capsule by mouth at bedtime., Disp: , Rfl:  .  mesalamine (LIALDA) 1.2 g EC tablet, TAKE 2 TABLETS BY MOUTH DAILY, Disp: 60 tablet, Rfl: 2 .  metoprolol tartrate (LOPRESSOR) 100 MG tablet, Take 1 tablet (100 mg total) by mouth once for 1 dose. Take TWO hours prior to CT procedure, Disp: 1 tablet, Rfl: 0 .  Multiple Vitamin (MULTIVITAMIN) tablet, Take 1 tablet by mouth daily., Disp: , Rfl:  .  nitroGLYCERIN (NITROSTAT) 0.4 MG SL tablet, Place 1 tablet (0.4 mg total) under the tongue every 5 (five) minutes as needed for chest pain. (Patient not taking: Reported on 09/22/2019), Disp: 25 tablet, Rfl: 3 .  olmesartan (BENICAR) 5 MG tablet, Take 2 tablets (10 mg total) by mouth daily., Disp: 60 tablet, Rfl: 5 .  ondansetron (ZOFRAN-ODT) 4 MG disintegrating tablet, DISSOLVE 1 TABLET(4 MG) ON THE TONGUE EVERY 6 HOURS AS NEEDED FOR NAUSEA OR VOMITING, Disp: 90 tablet, Rfl: 1 .  pantoprazole (PROTONIX) 40 MG tablet, TAKE 1 TABLET(40 MG) BY MOUTH TWICE DAILY, Disp: 60 tablet, Rfl: 6 .  Probiotic Product (PROBIOTIC DAILY PO), Take by mouth., Disp: , Rfl:   .  QUEtiapine (SEROQUEL XR) 400 MG 24 hr tablet, Take 800 mg by mouth at bedtime., Disp: , Rfl:  .  Semaglutide, 1 MG/DOSE, (OZEMPIC, 1 MG/DOSE,) 4 MG/3ML SOPN, INJECT 1MG INTO THE SKIN ONCE A WEEK, Disp: 3 mL, Rfl: 3 .  Simethicone (GAS-X PO), Take 1 tablet by mouth 4 (four) times daily., Disp: , Rfl:  .  valACYclovir (VALTREX) 1000 MG tablet, TAKE 1 TABLET(1000 MG) BY MOUTH DAILY, Disp: 30 tablet, Rfl: 3 .  VRAYLAR capsule, Take 3 mg by mouth at bedtime., Disp: , Rfl:  .  VYVANSE 50 MG capsule, Take 50 mg by mouth every morning., Disp: , Rfl:  .  zaleplon (SONATA) 10 MG capsule, Take 20 mg by mouth at bedtime as needed for sleep., Disp: , Rfl:   EXAM:  VITALS per patient if applicable:  GENERAL: alert, oriented, appears well and in no acute distress  HEENT: atraumatic, conjunttiva clear, no obvious abnormalities on inspection of external nose and ears  NECK: normal movements of the head and neck  LUNGS: on inspection no signs of respiratory distress, breathing rate appears normal, no obvious gross SOB, gasping or wheezing  CV: no obvious cyanosis  MS: moves all visible extremities without noticeable abnormality  PSYCH/NEURO: pleasant and cooperative, no obvious depression or anxiety, speech and thought processing grossly intact  ASSESSMENT AND PLAN:  Discussed the following assessment and plan:  Nasal congestion  Cough  -we discussed possible serious and likely etiologies, options for evaluation and workup, limitations of telemedicine visit vs in person visit, treatment, treatment risks and precautions. Pt prefers to treat via telemedicine empirically rather than in person at this moment.  Query VURI versus allergies versus other. She opted for nasal saline, flonase and tessalon for cough.  Work/School slipped offered: declined Scheduled follow up with PCP offered:  Advised to seek prompt follow-up video visit or in person care if worsening, new symptoms arise, or if is not  improving with treatment. Advised to schedule follow up visit with PCP or [facility] if any further questions or concerns to avoid delays in care.   I discussed the assessment and treatment plan with the patient. The patient was provided an opportunity to ask questions and all were answered. The patient agreed with the plan and demonstrated an understanding of the instructions.     Lucretia Kern, DO

## 2020-03-22 ENCOUNTER — Telehealth (HOSPITAL_COMMUNITY): Payer: Self-pay | Admitting: Emergency Medicine

## 2020-03-22 NOTE — Telephone Encounter (Signed)
Reaching out to patient to offer assistance regarding upcoming cardiac imaging study; pt verbalizes understanding of appt date/time, parking situation and where to check in, pre-test NPO status and medications ordered, and verified current allergies; name and call back number provided for further questions should they arise Marchia Bond RN Navigator Cardiac Imaging [facility] [phone] office [phone] cell   144m metoprolol tartrate Pt instructed to postpone placing heart monitor until after the scan sara

## 2020-03-22 NOTE — Telephone Encounter (Signed)
Attempted to call patient regarding upcoming cardiac CT appointment. °Left message on voicemail with name and callback number °Jadore Veals RN Navigator Cardiac Imaging °[facility] °[phone] Office °[phone] Cell ° °

## 2020-03-23 ENCOUNTER — Telehealth (HOSPITAL_COMMUNITY): Payer: Self-pay | Admitting: Emergency Medicine

## 2020-03-23 ENCOUNTER — Telehealth: Payer: Self-pay | Admitting: Physician Assistant

## 2020-03-23 ENCOUNTER — Ambulatory Visit: Admission: RE | Admit: 2020-03-23 | Payer: BC Managed Care – PPO | Source: Ambulatory Visit

## 2020-03-23 ENCOUNTER — Telehealth (HOSPITAL_COMMUNITY): Payer: Self-pay | Admitting: *Deleted

## 2020-03-23 DIAGNOSIS — I493 Ventricular premature depolarization: Secondary | ICD-10-CM

## 2020-03-23 DIAGNOSIS — I4430 Unspecified atrioventricular block: Secondary | ICD-10-CM

## 2020-03-23 DIAGNOSIS — R42 Dizziness and giddiness: Secondary | ICD-10-CM

## 2020-03-23 NOTE — Telephone Encounter (Signed)
[facility] called to check on a PA they sent over yesterday for pt's Repatha. I stated in February we sent an appeal to the insurance.and the [employer] worker stated that we need to call the insurance and check on that appeal.

## 2020-03-23 NOTE — Telephone Encounter (Signed)
Thank you for the information. Her BP cuff may have given her error messages due to her very frequent PVCs seen on her EKG.   However, given her dizziness after metoprolol: --Recommendrepeat EKG this Friday. If not able to come into the office, can we get one through the hospital? --Please have her hold her Cardizem until her EKG is resulted. She should monitor her vitals and call if consistent elevation of HR or BP. As soon as her EKG is read, we provide further instruction re: medications +/- recommendations regarding her upcoming CT (and rate control), as well as re: her Zio.   Repeat EKG is recommended to ensure no progressed heart block. Would also like to recheck her QTc.  Further recommendations TBD.

## 2020-03-23 NOTE — Telephone Encounter (Signed)
Pt calling reporting feeling dizzy and lightheaded after taking PO 129m metoprolol in preparation for cardiac CTA. Pt at home and resting in bed. Pt states her husband is with her and will help care for her. I suggested she go to the ER since she reports a BP 83/57. Pt states she does not want to go to the ER. I then suggested plenty of PO hydration with water.   She has my callback number for further guidance.  SMarchia BondRN Navigator Cardiac Imaging [facility] [phone]  [phone]

## 2020-03-23 NOTE — Telephone Encounter (Signed)
Patient called back and reported that her BP has come up. Her latest readings are as follows:  1445:  92/63  P  63 1630:  100/60  Patient stated that she had received her Zio in the mail today. Since her CCTA had to be rescheduled to next week 03/30/20, I advised she wait to apply it until after the scan.  Will route to Marrianne Mood for approval.

## 2020-03-23 NOTE — Telephone Encounter (Signed)
I called and spoke with patient. She stated that she feels better than this morning but still a little dizzy. She stated that did not take her Diltiazem today as instructed prior to CCTA scan, Just the Lopressor 100 MG at 0900, 2 hours prior to her scheduled CCTA scan. She also did not take her Vyvanse, which she stated usually raises her BP, until 1230. She stated her BP reading at that time was 78/80 but she does is not sure that it is correct and if her is not sure her Bp cuff is working as she keeps getting an error message. I had her try to retake her BP while we were on the phone and she kept getting an error message.   Patient stated that she would have her son go pick up a new cuff when he wakes up, that he is still sleeping. She stated that she has been drinking a lot of water all day, and that she would have a sports drink that she has.  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

## 2020-03-23 NOTE — Telephone Encounter (Signed)
Calling to check on patient after feeling bad taking CCTA medications.  [place] RN Navigator Cardiac Imaging [facility] [phone] Office  [phone] Cell

## 2020-03-23 NOTE — Telephone Encounter (Signed)
Pt returning call and states that she is still ok.  Pt does still report some lightheadedness and states her systolic number is "above 80" and the diastolic number is "above 50." She states she is tolerating POs. Pt encouraged to go seek medical help regarding her symptoms especially if her systolic BP is in the 68'S."  Pt was informed that the half life of metoprolol tartrate is about 4.5-6 hours.  Pt verbalized understanding and stated she would want to wait another hour before considering medical help.  Eli Hose RN navigator, Cardiac Imaging Office: [phone]

## 2020-03-23 NOTE — Telephone Encounter (Signed)
Spoke with patient. Please see subsequent tele encounter.

## 2020-03-23 NOTE — Telephone Encounter (Signed)
Pt c/o medication issue:  1. Name of Medication: metoprolol 100 mg po x 1 for ct prep  2. How are you currently taking this medication (dosage and times per day)?   3. Are you having a reaction (difficulty breathing--STAT)? Low bp dizziness   4. What is your medication issue?  Patient took metoprolol for pre testing.  She had dizziness and hypotension 85/53 this morning and about the same this afternoon.  Patient advised by sara with ct and advised to reschedule testing .  Patient would like to discuss.

## 2020-03-24 ENCOUNTER — Ambulatory Visit: Payer: BC Managed Care – PPO | Admitting: Physician Assistant

## 2020-03-24 ENCOUNTER — Other Ambulatory Visit (INDEPENDENT_AMBULATORY_CARE_PROVIDER_SITE_OTHER): Payer: BC Managed Care – PPO

## 2020-03-24 ENCOUNTER — Other Ambulatory Visit: Payer: Self-pay

## 2020-03-24 ENCOUNTER — Encounter: Payer: Self-pay | Admitting: Physician Assistant

## 2020-03-24 DIAGNOSIS — F900 Attention-deficit hyperactivity disorder, predominantly inattentive type: Secondary | ICD-10-CM | POA: Diagnosis not present

## 2020-03-24 DIAGNOSIS — E559 Vitamin D deficiency, unspecified: Secondary | ICD-10-CM

## 2020-03-24 DIAGNOSIS — I10 Essential (primary) hypertension: Secondary | ICD-10-CM

## 2020-03-24 DIAGNOSIS — F3174 Bipolar disorder, in full remission, most recent episode manic: Secondary | ICD-10-CM | POA: Diagnosis not present

## 2020-03-24 DIAGNOSIS — E876 Hypokalemia: Secondary | ICD-10-CM | POA: Diagnosis not present

## 2020-03-24 DIAGNOSIS — Z79899 Other long term (current) drug therapy: Secondary | ICD-10-CM

## 2020-03-24 DIAGNOSIS — F3176 Bipolar disorder, in full remission, most recent episode depressed: Secondary | ICD-10-CM | POA: Diagnosis not present

## 2020-03-24 LAB — COMPREHENSIVE METABOLIC PANEL
ALT: 70 U/L — ABNORMAL HIGH (ref 0–35)
AST: 60 U/L — ABNORMAL HIGH (ref 0–37)
Albumin: 4.2 g/dL (ref 3.5–5.2)
Alkaline Phosphatase: 123 U/L — ABNORMAL HIGH (ref 39–117)
BUN: 15 mg/dL (ref 6–23)
CO2: 27 mEq/L (ref 19–32)
Calcium: 9.5 mg/dL (ref 8.4–10.5)
Chloride: 109 mEq/L (ref 96–112)
Creatinine, Ser: 0.83 mg/dL (ref 0.40–1.20)
GFR: 75.95 mL/min (ref >60.00)
Glucose, Bld: 111 mg/dL — ABNORMAL HIGH (ref 70–99)
Potassium: 4.4 mEq/L (ref 3.5–5.1)
Sodium: 143 mEq/L (ref 135–145)
Total Bilirubin: 0.5 mg/dL (ref 0.2–1.2)
Total Protein: 6.2 g/dL (ref 6.0–8.3)

## 2020-03-24 LAB — CBC WITH DIFFERENTIAL/PLATELET
Basophils Absolute: 0.1 10*3/uL (ref 0.0–0.1)
Basophils Relative: 0.6 % (ref 0.0–3.0)
Eosinophils Absolute: 0.4 10*3/uL (ref 0.0–0.7)
Eosinophils Relative: 5.1 % — ABNORMAL HIGH (ref 0.0–5.0)
HCT: 43.6 % (ref 36.0–46.0)
Hemoglobin: 14.8 g/dL (ref 12.0–15.0)
Lymphocytes Relative: 20.7 % (ref 12.0–46.0)
Lymphs Abs: 1.7 10*3/uL (ref 0.7–4.0)
MCHC: 33.8 g/dL (ref 30.0–36.0)
MCV: 91.5 fl (ref 78.0–100.0)
Monocytes Absolute: 0.7 10*3/uL (ref 0.1–1.0)
Monocytes Relative: 8.3 % (ref 3.0–12.0)
Neutro Abs: 5.2 10*3/uL (ref 1.4–7.7)
Neutrophils Relative %: 65.3 % (ref 43.0–77.0)
Platelets: 302 10*3/uL (ref 150.0–400.0)
RBC: 4.77 Mil/uL (ref 3.87–5.11)
RDW: 14.5 % (ref 11.5–15.5)
WBC: 8 10*3/uL (ref 4.0–10.5)

## 2020-03-24 LAB — VITAMIN D 25 HYDROXY (VIT D DEFICIENCY, FRACTURES): VITD: 43.86 ng/mL (ref 30.00–100.00)

## 2020-03-24 LAB — VITAMIN B12: Vitamin B-12: 1077 pg/mL — ABNORMAL HIGH (ref 211–911)

## 2020-03-24 NOTE — Telephone Encounter (Signed)
I have already completed the second set of forms, please check

## 2020-03-24 NOTE — Addendum Note (Signed)
Addended by: Darlyne Russian on: 03/24/2020 12:29 PM   Modules accepted: Orders

## 2020-03-24 NOTE — Telephone Encounter (Addendum)
Spoke to pt. She is unable to have transportation to hospital today for EKG.  She is able to Monday. Scheduled EKG at [facility] Monday 3/14 @ 1PM.  Last ov note states pt to continue current Diltiazem and PRN dose.  Med list reflects Diltiazem 358m daily. And pt reports that she has ONLY been taking Diltiazem 358mPRN.  Confirms she has not taken Diltiazem 30026maily at all. Added PRN dose to med list.  Pt still confirms that she will not take Diltiazem until EKG reviewed.  Notified pt further medication recc will be given after provider has reviewed EKG.  Pt verbalizes she will continue to monitor BP/HR and will call the office with consistent elevation.

## 2020-03-24 NOTE — Telephone Encounter (Signed)
Notified pt called [employer]--stated appeal is not correct or missing info and appeal been cancel .Insurance will resent the forms to the office restart the appeal.

## 2020-03-24 NOTE — Telephone Encounter (Signed)
Attempted to call pt to discuss below.  1) Will have EKG scheduled to be done today at [facility] if pt able.  2) Instruct pt to hold Cardizem until EKG resulted 3) Monitor BP/HR and call us if consistent elevation of HR or BP.  4) Also need to clarify with pt if she is only taking long acting Cardizem or also PRN short acting.   When EKG results will provide further instructions regarding upcoming CT instructions.   Pt did not answer. Lmtcb.

## 2020-03-24 NOTE — Addendum Note (Signed)
Addended by: Darlyne Russian on: 03/24/2020 01:27 PM   Modules accepted: Orders

## 2020-03-24 NOTE — Telephone Encounter (Signed)
Per Malachi Bonds, if pt is not having continues symptoms, and feeling well, she may resume Cardizem 13m TID PRN and we may cancel EKG. Spoke to pt, she denies any continued symptoms. This morning 2 hrs after AM medication BP 122/74 HR 71. Pt made aware that EKG cancelled and she may resume PRN Cardizem. Pt verbalized understanding.

## 2020-03-27 ENCOUNTER — Ambulatory Visit (INDEPENDENT_AMBULATORY_CARE_PROVIDER_SITE_OTHER): Payer: BC Managed Care – PPO | Admitting: Family Medicine

## 2020-03-27 ENCOUNTER — Other Ambulatory Visit: Payer: Self-pay

## 2020-03-27 ENCOUNTER — Encounter: Payer: Self-pay | Admitting: Family Medicine

## 2020-03-27 VITALS — BP 124/80 | HR 95 | Temp 96.9°F | Ht 65.5 in | Wt 193.2 lb

## 2020-03-27 DIAGNOSIS — E1159 Type 2 diabetes mellitus with other circulatory complications: Secondary | ICD-10-CM | POA: Diagnosis not present

## 2020-03-27 DIAGNOSIS — Z0000 Encounter for general adult medical examination without abnormal findings: Secondary | ICD-10-CM | POA: Diagnosis not present

## 2020-03-27 DIAGNOSIS — E669 Obesity, unspecified: Secondary | ICD-10-CM

## 2020-03-27 DIAGNOSIS — G3281 Cerebellar ataxia in diseases classified elsewhere: Secondary | ICD-10-CM

## 2020-03-27 DIAGNOSIS — K5100 Ulcerative (chronic) pancolitis without complications: Secondary | ICD-10-CM | POA: Diagnosis not present

## 2020-03-27 DIAGNOSIS — I471 Supraventricular tachycardia, unspecified: Secondary | ICD-10-CM

## 2020-03-27 DIAGNOSIS — E890 Postprocedural hypothyroidism: Secondary | ICD-10-CM

## 2020-03-27 DIAGNOSIS — M85859 Other specified disorders of bone density and structure, unspecified thigh: Secondary | ICD-10-CM

## 2020-03-27 DIAGNOSIS — I10 Essential (primary) hypertension: Secondary | ICD-10-CM | POA: Diagnosis not present

## 2020-03-27 DIAGNOSIS — Z79899 Other long term (current) drug therapy: Secondary | ICD-10-CM

## 2020-03-27 DIAGNOSIS — E785 Hyperlipidemia, unspecified: Secondary | ICD-10-CM

## 2020-03-27 DIAGNOSIS — F3130 Bipolar disorder, current episode depressed, mild or moderate severity, unspecified: Secondary | ICD-10-CM

## 2020-03-27 DIAGNOSIS — R7401 Elevation of levels of liver transaminase levels: Secondary | ICD-10-CM | POA: Insufficient documentation

## 2020-03-27 DIAGNOSIS — E559 Vitamin D deficiency, unspecified: Secondary | ICD-10-CM

## 2020-03-27 DIAGNOSIS — E1169 Type 2 diabetes mellitus with other specified complication: Secondary | ICD-10-CM

## 2020-03-27 MED ORDER — GABAPENTIN 300 MG PO CAPS: ORAL_CAPSULE | ORAL | 11 refills | Status: DC

## 2020-03-27 MED ORDER — VALACYCLOVIR HCL 1 G PO TABS: ORAL_TABLET | ORAL | 3 refills | Status: DC

## 2020-03-27 NOTE — Telephone Encounter (Signed)
CT never done 3/10, OV with Christell Faith 3/11/ not kept- will remove from pool.  Kerin Ransom PA-C 03/27/2020 8:56 AM

## 2020-03-27 NOTE — Progress Notes (Unsigned)
Subjective:    Patient ID: Lindsay Fry, female    DOB: [birth year], 60 y.o.   MRN: [id number]  This visit occurred during the SARS-CoV-2 public health emergency.  Safety protocols were in place, including screening questions prior to the visit, additional usage of staff PPE, and extensive cleaning of exam room while observing appropriate contact time as indicated for disinfecting solutions.    HPI Here for health maintenance exam and to review chronic medical problems    Wt Readings from Last 3 Encounters:  03/27/20 193 lb 3 oz (87.6 kg)  03/14/20 196 lb (88.9 kg)  03/10/20 199 lb (90.3 kg)  wt loss noted  31.66 kg/m  Doing better lately/thankful for that  Taking care of stuff at home   Bipolar/mixed  Some medicine changes  vraylar is helping a lot - doing better overall  Has not been allowed to exercise yet- but using some ankle and hand weights when in recliner  Wants get back to the pool   covid vaccinated and has had covid   Colonoscopy 4/20 with a 2 y recall due to UC- has it set for 4/19  (has to do a 2 d prep) Takes lialda   No mammogram due to past mastectomy  No changes  No longer on anti estrogen tx   Tdap 11/12 pna vaccine utd 10/18 Zoster status - interested in shingrix in the future   Sees gyn - no longer sees gyn  No problems  Low libido   Osteopenia  dexa 2/22 -in the normal range recently Falls- had a fall in the last 10 days  (fainted when bp was too low)  No longer dizziness/balance issues  Fractures-none new Takes vitamin D  HTN  bp is stable today  No cp or palpitations or headaches or edema  No side effects to medicines  BP Readings from Last 3 Encounters:  03/27/20 124/80  03/14/20 118/72  03/10/20 140/80     Pulse Readings from Last 3 Encounters:  03/27/20 95  03/14/20 63  03/10/20 86   very labile bp and pulse lately  Cardiology is seeing her   cardizem 30 mg tid prn Diltiazem 300 mg ext rel daily benicar 10 mg daily    Hypothyroid Sees Dr Dwyane Dee Lab Results  Component Value Date   TSH 2.87 03/07/2020   Takes levoxyl 125 mcg daily   DM2 Sees Dr Dwyane Dee Lab Results  Component Value Date   HGBA1C 6.7 (H) 03/07/2020   Controlled  levemir 60 u daily jardiance 25 mg daily semalutide - likes it   Eating fresh foods Not fried   Eye exam = recent/ we will send for report No problems  Hyperlipidemia Lab Results  Component Value Date   CHOL 120 03/07/2020   CHOL 181 12/13/2019   CHOL 154 09/06/2019   Lab Results  Component Value Date   HDL 40.30 03/07/2020   HDL 50.20 12/13/2019   HDL 41.60 09/06/2019   Lab Results  Component Value Date   LDLCALC 46 03/07/2020   [place] 74 02/05/2019   [place] 79 09/28/2018   Lab Results  Component Value Date   TRIG 172.0 (H) 03/07/2020   TRIG 216.0 (H) 12/13/2019   TRIG 209.0 (H) 09/06/2019   Lab Results  Component Value Date   CHOLHDL 3 03/07/2020   CHOLHDL 4 12/13/2019   CHOLHDL 4 09/06/2019   Lab Results  Component Value Date   LDLDIRECT 125.0 02/07/2020   LDLDIRECT 109.0 12/13/2019   LDLDIRECT  86.0 09/06/2019   Has had high trig with DM2  Atorvastatin 80 mg daily  repatha 140 mg twice montlhy - waiting on approval right now and will likely replace the atorvastatin with it  zetia 10 mg   Well controlled LDL at 93   H/o CAD under care of cardiology  On plavix  Has CT ca score scheduled   Takes ppi for gerd  protonix Lab Results  Component Value Date   VITAMINB12 1,077 (H) 03/24/2020   Vit D level 43.8   Lab Results  Component Value Date   CREATININE 0.83 03/24/2020   BUN 15 03/24/2020   NA 143 03/24/2020   K 4.4 03/24/2020   CL 109 03/24/2020   CO2 27 03/24/2020   Lab Results  Component Value Date   ALT 70 (H) 03/24/2020   AST 60 (H) 03/24/2020   ALKPHOS 123 (H) 03/24/2020   BILITOT 0.5 03/24/2020   transaminases have been up in the past Atorvastatin may be making this higher- may change to pcyk9 soon UC can  affect it   Patient Active Problem List   Diagnosis Date Noted  . Cerebellar ataxia in diseases classified elsewhere ([place]) 03/28/2020  . Elevated transaminase level 03/27/2020  . Current use of proton pump inhibitor 03/19/2020  . Hypokalemia 03/07/2020  . History of COVID-19 03/07/2020  . Aortic atherosclerosis ([place]) 03/07/2020  . Double vision 12/14/2019  . Memory disturbance 08/05/2019  . Obesity (BMI 30-39.9) 04/23/2019  . Dizziness 12/16/2018  . Poor balance 12/16/2018  . Fatigue 12/16/2018  . Migraine 11/12/2018  . Uncontrolled type 2 diabetes mellitus with hyperglycemia, with long-term current use of insulin ([place]) 02/19/2018  . Pleural thickening 11/05/2017  . Mouth ulcers 11/05/2017  . Cervical radiculopathy due to degenerative joint disease of spine 10/28/2017  . 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 Supraventricular tachycardia ([place]) 04/22/2017  . Osteopenia 09/25/2016  . Estrogen deficiency 05/30/2016  . Need for hepatitis C screening test 03/19/2016  . Carcinoma of overlapping sites of left breast in female, estrogen receptor positive ([place]) 08/21/2015  . Chronic constipation 01/18/2015  . NSTEMI (non-ST elevated myocardial infarction) ([place]) 09/06/2014  . CAD (coronary artery disease) 09/06/2014  . DM (diabetes mellitus) ([place]) 09/05/2014  . History of breast cancer 06/02/2014  . Sinus tachycardia 04/27/2014  . ADD (attention deficit disorder) 11/19/2012  . Low back pain 08/05/2012  . Hypothyroid 05/18/2012  . Chronic sinusitis 01/24/2012  . Vertigo, benign positional 01/24/2012  . Other screening mammogram 12/05/2010  . Routine general medical examination at a health care facility 10/14/2010  . POSTMENOPAUSAL STATUS 06/22/2008  . Vitamin D deficiency 03/18/2008  . BENIGN NEOPLASM [place] SITE DIGESTIVE SYSTEM 01/09/2007  . HIATAL HERNIA 01/09/2007  . COLONIC POLYPS, ADENOMATOUS, HX OF 01/09/2007  . Hyperlipidemia associated with type 2 diabetes mellitus ([place])  01/07/2007  . Bipolar I disorder, most recent episode depressed ([place]) 01/07/2007  . Essential hypertension 01/07/2007  . ALLERGIC RHINITIS 01/07/2007  . ASTHMA 01/07/2007  . GERD 01/07/2007  . Ulcerative colitis ([place]) 01/07/2007  . ACNE ROSACEA 01/07/2007  . MIGRAINES, HX OF 01/07/2007  . Fibromyalgia 10/30/2006  . INSOMNIA 10/30/2006   Past Medical History:  Diagnosis Date  . ADD (attention deficit disorder) 11/19/2012  . Allergic rhinitis, cause unspecified   . Anxiety state, unspecified   . Arthritis   . Asymptomatic postmenopausal status (age-related) (natural)   . Atypical hyperplasia of left breast 2000  . Atypical mole 03/16/2018   R paraspinal mid upper back, excision  .  Atypical mole 02/17/2018   R spinal upper back, moderate  . Atypical mole 08/11/2017   L sternum, excision  . Atypical mole 05/27/2017   L post neck, moderate  . Benign neoplasm of other and unspecified site of the digestive system   . Bipolar disorder ([place])   . Breast cancer ([place])    Left Breast 43m invasive CA left uiq, dcis left uoq and a lot ADH, ALH in both breasts.  .Marland KitchenCAD (coronary artery disease)    a. 08/2014 NSTEMI/Cath: LM nl, LAD 50p, D1/D2 min irregs, LCX nl, OM1 40, LPDA nl, RI 99ost (2.045mvessel->med Rx), RCA nl, AM 60, nl EF; b. 11/2016 MV: EF 74%, no ischemia (performed 2/2 dyspnea & new inf TWI).  . Marland Kitchenarcinoma of left breast (HCCarthage5/19/2016  . Cataract   . Chronic diastolic CHF (congestive heart failure) ([facility]   a. 08/2014 Echo: EF 60-65%, Gr 1 DD, nl LA.  . Marland Kitchenlotting disorder (HCAmboy   on blood thiner, Plavix  . Cyst    on Achilles tendon  . Depression   . Diabetes mellitus (HCA[facility]la[facility]   frank  . Dysuria   . Edema   . Family history of malignant neoplasm of gastrointestinal tract   . Fibromyalgia   . GERD (gastroesophageal reflux disease)   . Glaucoma    pre glaucoma, 05/14/2018 pt. denies glaucoma  . Headache    migraines  . Hiatal hernia   . History of alcoholism ([facility]  .  History of migraines   . History of ovarian cyst   . Hx of adenomatous colonic polyps   . Insomnia, unspecified   . Myocardial infarction ([facility]CCo[place] City8-21-16  . OCD (obsessive compulsive disorder)   . Osteopenia 09/25/2016   Femoral neck T -1.1  9/18  . Other screening mammogram   . Pure hypercholesterolemia   . Rosacea   . Subacute confusional state 11/19/2012  . Tubular adenoma of colon 01/10/11  . Ulcerative colitis, unspecified   . Unspecified asthma(493.90)   . Unspecified essential hypertension   . Unspecified hypothyroidism   . Unspecified vitamin D deficiency   . Vertigo    Past Surgical History:  Procedure Laterality Date  . APPENDECTOMY    . AXILLARY SENTINEL NODE BIOPSY Left 05/23/2014   Procedure: AXILLARY SENTINEL NODE BIOPSY;  Surgeon: SeChristene LyeMD;  Location: [facility] ORS;  Service: General;  Laterality: Left;  . BREAST BIOPSY  Aug 2000   on tamoxifen, atypical hyperplasia  . BREAST BIOPSY Left 04/2014  . BREAST SURGERY Left Aug 2000   lumpectomy/ Dr CrSharlet Salina. BREAST SURGERY Bilateral 05/23/14   Mastectomy  . BUNIONECTOMY Right   . CARDIAC CATHETERIZATION N/A 09/05/2014   Procedure: Left Heart Cath and Coronary Angiography;  Surgeon: MuWellington HampshireMD;  [facility];  Service: Cardiovascular;  Laterality: N/A;  . CARDIAC CATHETERIZATION  09-05-14  . CATARACT EXTRACTION W/ INTRAOCULAR LENS IMPLANT Bilateral   . CESAREAN SECTION  1996/1997   placenta previa, gest DM, pre-eclampsia  . CHOLECYSTECTOMY  1997   adhesions also  . COLONOSCOPY  01/2001   Ulcerative colitis  . COLONOSCOPY  04/2004   UC, polyp  . COLONOSCOPY  12/08   UC, no polyps  . DEXA  04/1999 and 2010   normal  . ESOPHAGOGASTRODUODENOSCOPY  09/2001   polyp  . exercise stress test  11/2003   negative  . FEMUR FRACTURE SURGERY Left   . MASTECTOMY Bilateral   .  MECKEL DIVERTICULUM EXCISION  1999  . NASAL SINUS SURGERY  05/1997  . nuclear stress test  06/2007   negative  .  precancerous mole removed    . SIMPLE MASTECTOMY WITH AXILLARY SENTINEL NODE BIOPSY Bilateral 05/23/2014   Procedure: Bilateral simple mastectomy, left sentinel node biopsy ;  Surgeon: Christene Lye, MD;  Location: [facility] ORS;  Service: General;  Laterality: Bilateral;  . Sleep study  11/09   no apnea, but did snore (done by [facility])  . TONSILLECTOMY  1984  . TUBAL LIGATION    . WISDOM TOOTH EXTRACTION  1980's   Social History   Tobacco Use  . Smoking status: Former Smoker    Packs/day: 0.25    Years: 5.00    Pack years: 1.25    Types: Cigarettes    Quit date: 01/14/1993    Years since quitting: 27.2  . Smokeless tobacco: Never Used  Vaping Use  . Vaping Use: Never used  Substance Use Topics  . Alcohol use: No    Alcohol/week: 0.0 standard drinks    Comment: Recovered ETOH  . Drug use: No   Family History  Problem Relation Age of Onset  . Coronary artery disease Father   . Colon cancer Father   . Alzheimer's disease Father   . Dementia Father   . Colon polyps Father   . Coronary artery disease Mother   . Hypertension Mother   . Osteoporosis Mother   . Crohn's disease Mother        crohns colitis  . Breast cancer Other        great aunts  . Coronary artery disease Other        Uncle (also AAA)  . Diabetes Other        remote family history  . Stroke Cousin   . Esophageal cancer Neg Hx   . Rectal cancer Neg Hx   . Stomach cancer Neg Hx    Allergies  Allergen Reactions  . Ephedrine Other (See Comments)    Pt becomes hyper  . Aspartame And Phenylalanine Nausea Only  . Aspirin     REACTION: aggrivates colitis  . Crestor [Rosuvastatin]     REACTION: increased lfts  . Erythromycin     REACTION: GI upset  . Nsaids     REACTION: aggrivate colitis  . Oxycodone Other (See Comments)    Panic Attack   Current Outpatient Medications on File Prior to Visit  Medication Sig Dispense Refill  . acetaminophen (TYLENOL) 650 MG CR tablet Take 1,300 mg by mouth 2  (two) times daily.    Marland Kitchen amantadine (SYMMETREL) 100 MG capsule Take 100 mg by mouth 2 (two) times daily.    Marland Kitchen atorvastatin (LIPITOR) 80 MG tablet TAKE 1 TABLET(80 MG) BY MOUTH DAILY 90 tablet 1  . BAYER MICROLET LANCETS lancets USE AS DIRECTED TO CHECK BLOOD SUGAR TWICE DAILY 200 each 2  . Bioflavonoid Products (VITAMIN C) CHEW Chew 1 tablet by mouth daily.    . Blood Glucose Monitoring Suppl (CONTOUR NEXT MONITOR) w/Device KIT 1 each by Does not apply route in the morning and at bedtime. Use Contour Next meter to check blood sugar twice daily. 1 kit 0  . Calcium Carb-Cholecalciferol 600-800 MG-UNIT TABS Take 1 tablet by mouth daily.    . Cholecalciferol (VITAMIN D3) 125 MCG (5000 UT) CAPS Take 1 capsule by mouth daily.    . clopidogrel (PLAVIX) 75 MG tablet TAKE 1 TABLET(75 MG) BY MOUTH DAILY 90 tablet  0  . co-enzyme Q-10 50 MG capsule Take 50 mg by mouth daily.    . Cyanocobalamin (VITAMIN B-12 PO) 3000 mcg-- 2 gummies daily    . diazepam (VALIUM) 10 MG tablet Take 2.5-10 mg by mouth as needed for anxiety.    . docusate sodium (COLACE) 100 MG capsule Take by mouth. 3 in the morning and 2 at bedtime    . ezetimibe (ZETIA) 10 MG tablet TAKE 1 TABLET(10 MG) BY MOUTH DAILY 90 tablet 3  . famotidine (PEPCID) 40 MG tablet Take 40 mg by mouth 2 (two) times daily.    Marland Kitchen glucose blood (CONTOUR NEXT TEST) test strip Use as instructed 100 each 12  . insulin detemir (LEVEMIR FLEXTOUCH) 100 UNIT/ML FlexPen ADMINISTER 51 UNITS UNDER THE SKIN EVERY DAY IN THE EVENING (Patient taking differently: ADMINISTER 60 UNITS UNDER THE SKIN EVERY DAY IN THE EVENING) 30 mL 2  . Insulin Pen Needle (B-D UF III MINI PEN NEEDLES) 31G X 5 MM MISC USE FOR INJECTIONS TWICE DAILY 100 each 12  . JARDIANCE 25 MG TABS tablet TAKE 1 TABLET(25 MG) BY MOUTH DAILY 30 tablet 2  . ketoconazole (NIZORAL) 2 % cream Apply to affected areas one to two times daily as needed. 60 g 2  . Lactobacillus-Inulin (CULTURELLE DIGESTIVE DAILY PO) Take 1  capsule by mouth daily.    Marland Kitchen lamoTRIgine (LAMICTAL) 200 MG tablet Take 400 mg by mouth daily.    Marland Kitchen latanoprost (XALATAN) 0.005 % ophthalmic solution Place 1 drop into both eyes 2 (two) times a week.     Marland Kitchen LEVOXYL 125 MCG tablet Take 2 tablets (250 mcg total) by mouth daily before breakfast. 60 tablet 3  . linaclotide (LINZESS) 290 MCG CAPS capsule Take 1 capsule (290 mcg total) by mouth daily before breakfast. 30 capsule 6  . loperamide (IMODIUM A-D) 2 MG tablet Take 2 mg by mouth as needed for diarrhea or loose stools.    . Melatonin 10 MG CAPS Take 1 capsule by mouth at bedtime.    . mesalamine (LIALDA) 1.2 g EC tablet TAKE 2 TABLETS BY MOUTH DAILY 60 tablet 2  . Multiple Vitamin (MULTIVITAMIN) tablet Take 1 tablet by mouth daily.    Marland Kitchen olmesartan (BENICAR) 5 MG tablet Take 2 tablets (10 mg total) by mouth daily. 60 tablet 5  . ondansetron (ZOFRAN-ODT) 4 MG disintegrating tablet DISSOLVE 1 TABLET(4 MG) ON THE TONGUE EVERY 6 HOURS AS NEEDED FOR NAUSEA OR VOMITING 90 tablet 1  . pantoprazole (PROTONIX) 40 MG tablet TAKE 1 TABLET(40 MG) BY MOUTH TWICE DAILY 60 tablet 6  . Probiotic Product (PROBIOTIC DAILY PO) Take by mouth.    . QUEtiapine (SEROQUEL XR) 400 MG 24 hr tablet Take 800 mg by mouth at bedtime.    . Semaglutide, 1 MG/DOSE, (OZEMPIC, 1 MG/DOSE,) 4 MG/3ML SOPN INJECT 1MG INTO THE SKIN ONCE A WEEK 3 mL 3  . Simethicone (GAS-X PO) Take 1 tablet by mouth 4 (four) times daily.    Marland Kitchen VRAYLAR capsule Take 3 mg by mouth at bedtime.    Marland Kitchen VYVANSE 50 MG capsule Take 50 mg by mouth every morning.    . zaleplon (SONATA) 10 MG capsule Take 20 mg by mouth at bedtime as needed for sleep.    . Evolocumab (REPATHA) 140 MG/ML SOSY Inject contents of pen twice a month (Patient not taking: Reported on 03/27/2020) 2.1 mL 3  . metoprolol tartrate (LOPRESSOR) 100 MG tablet Take 1 tablet (100 mg total) by mouth  once for 1 dose. Take TWO hours prior to CT procedure 1 tablet 0  . nitroGLYCERIN (NITROSTAT) 0.4 MG SL  tablet Place 1 tablet (0.4 mg total) under the tongue every 5 (five) minutes as needed for chest pain. (Patient not taking: Reported on 09/22/2019) 25 tablet 3   No current facility-administered medications on file prior to visit.    Review of Systems  Constitutional: Positive for fatigue. Negative for activity change, appetite change, fever and unexpected weight change.  HENT: Negative for congestion, ear pain, rhinorrhea, sinus pressure and sore throat.   Eyes: Negative for pain, redness and visual disturbance.  Respiratory: Negative for cough, shortness of breath and wheezing.   Cardiovascular: Negative for chest pain and palpitations.  Gastrointestinal: Negative for abdominal pain, blood in stool, constipation and diarrhea.  Endocrine: Negative for polydipsia and polyuria.  Genitourinary: Negative for dysuria, frequency and urgency.  Musculoskeletal: Positive for arthralgias and back pain. Negative for myalgias.  Skin: Negative for pallor and rash.  Allergic/Immunologic: Negative for environmental allergies.  Neurological: Negative for dizziness, syncope and headaches.       Poor balance  Hematological: Negative for adenopathy. Does not bruise/bleed easily.  Psychiatric/Behavioral: Positive for decreased concentration and dysphoric mood. The patient is nervous/anxious.        Less depressed than baseline       Objective:   Physical Exam Constitutional:      General: She is not in acute distress.    Appearance: Normal appearance. She is well-developed. She is obese. She is not ill-appearing or diaphoretic.  HENT:     Head: Normocephalic and atraumatic.     Right Ear: Tympanic membrane, ear canal and external ear normal.     Left Ear: Tympanic membrane, ear canal and external ear normal.     Nose: Nose normal. No congestion.     Mouth/Throat:     Mouth: Mucous membranes are moist.     Pharynx: Oropharynx is clear. No posterior oropharyngeal erythema.  Eyes:     General: No  scleral icterus.    Extraocular Movements: Extraocular movements intact.     Conjunctiva/sclera: Conjunctivae normal.     Pupils: Pupils are equal, round, and reactive to light.  Neck:     Thyroid: No thyromegaly.     Vascular: No carotid bruit or JVD.  Cardiovascular:     Rate and Rhythm: Normal rate and regular rhythm.     Pulses: Normal pulses.     Heart sounds: Normal heart sounds. No gallop.   Pulmonary:     Effort: Pulmonary effort is normal. No respiratory distress.     Breath sounds: Normal breath sounds. No wheezing.     Comments: Good air exch Chest:     Chest wall: No tenderness.  Abdominal:     General: Bowel sounds are normal. There is no distension or abdominal bruit.     Palpations: Abdomen is soft. There is no mass.     Tenderness: There is no abdominal tenderness.     Hernia: No hernia is present.  Genitourinary:    Comments: S/p bilateral mastectomy  No abn on palpitation of chest wall and axillae   Musculoskeletal:        General: No tenderness. Normal range of motion.     Cervical back: Normal range of motion and neck supple. No rigidity. No muscular tenderness.     Right lower leg: No edema.     Left lower leg: No edema.     Comments: No  kyphosis   Lymphadenopathy:     Cervical: No cervical adenopathy.  Skin:    General: Skin is warm and dry.     Coloration: Skin is not pale.     Findings: No erythema or rash.     Comments: Solar lentigines diffusely   Neurological:     Mental Status: She is alert. Mental status is at baseline.     Cranial Nerves: No cranial nerve deficit.     Motor: No abnormal muscle tone.     Coordination: Coordination normal.     Gait: Gait normal.     Deep Tendon Reflexes: Reflexes are normal and symmetric. Reflexes normal.     Comments: Slow gait  Psychiatric:        Mood and Affect: Mood normal.        Cognition and Memory: Cognition and memory normal.     Comments: Pleasant and talkative           Assessment &  Plan:   Problem List Items Addressed This Visit      Cardiovascular and Mediastinum   Essential hypertension    bp in fair control at this time  BP Readings from Last 1 Encounters:  03/27/20 124/80   No changes needed Most recent labs reviewed  Disc lifstyle change with low sodium diet and exercise  Seeing cardiology for labile bp recently  Continues diltiazem ER 300 mg daily and benicar 10 mg daily (with cardizem 30 mg tid prn ) Pending advisement from cardiology      Supraventricular tachycardia ([place])    Under care of cardiology No recent episodes         Digestive   Ulcerative colitis ([place])    Under care of GI Has colonoscopy upcoming next month Stable with lialda currently        Endocrine   DM (diabetes mellitus) ([place]) (Chronic)    Under the care of endocrinology  Lab Results  Component Value Date   HGBA1C 6.7 (H) 03/07/2020   Plans to continue levemir and jardiance and semaglutide Diet has improved and pt is loosing weight       Hyperlipidemia associated with type 2 diabetes mellitus ([place])    Disc goals for lipids and reasons to control them (in the setting of CAD) Rev last labs with pt Rev low sat fat diet in detail Good control LDL of 46 Taking atorvastatin 80 mg daily and zetia 10 mg daily  Plan to change to repatha 140 mg if covered  Statin may be inc her LFTs      Hypothyroid    Under the care of endocrinology  Lab Results  Component Value Date   TSH 2.87 03/07/2020    Plans to continue levoxyl 125 mcg daily        Nervous and Auditory   Cerebellar ataxia in diseases classified elsewhere [facility])    Some improvement but balance is still poor One recent fall  Under care of neurology        Musculoskeletal and Integument   Osteopenia    Recent dexa outside our office was WNL  Reassuring One fall w/o injury- having bp problems  No fractures Continues vit D Encourage exercise as tolerated         Other   Vitamin D deficiency     Discussed importance to bone and overall health  Plan to continue current supplementation       Bipolar I disorder, most recent episode depressed ([place])    Doing  better recently  Under care of psychiatrist  Arman Filter has made a difference       Routine general medical examination at a health care facility - Primary    Reviewed health habits including diet and exercise and skin cancer prevention Reviewed appropriate screening tests for age  Also reviewed health mt list, fam hx and immunization status , as well as social and family history   See HPI Labs reviewed  Colonoscopy planned next month  Interested in Shingrix if it is covered  dexa utd and nl, one fall and no fx, taking vit D Mood is improved      Obesity (BMI 30-39.9)    Discussed how this problem influences overall health and the risks it imposes  Reviewed plan for weight loss with lower calorie diet (via better food choices and also portion control or program like weight watchers) and exercise building up to or more than 30 minutes 5 days per week including some aerobic activity   Wt loss noted with Semaglutide      Current use of proton pump inhibitor    Vitamin B12 and vitamin D are adequately supplemented (will dec B12 to every other day for mildly high level)       Elevated transaminase level    Suspect baseline fatty liver and some effect from high dose statin  Working on a change to [employer] against excessive acetaminophen or alcohol Continue to watch Under care of GI as well

## 2020-03-27 NOTE — Telephone Encounter (Signed)
Started appeal --03/25/20.

## 2020-03-27 NOTE — Patient Instructions (Addendum)
If you are interested in the shingles vaccine series (Shingrix), call your insurance or pharmacy to check on coverage and location it must be given.  If affordable - you can schedule it here or at your pharmacy depending on coverage   We will send for your last diabetic eye exam on the way out   Take your B complex vitamin every other day instead of daily  Your B12 level is a bit high   Keep watching liver enzymes with your GI doctor  If you come off of atorvastatin that may help

## 2020-03-27 NOTE — Telephone Encounter (Signed)
[facility] called to check on status of PA. Relayed to pharmacy an appeal has been started

## 2020-03-28 ENCOUNTER — Telehealth: Payer: Self-pay | Admitting: *Deleted

## 2020-03-28 ENCOUNTER — Ambulatory Visit (INDEPENDENT_AMBULATORY_CARE_PROVIDER_SITE_OTHER): Payer: Self-pay | Admitting: Dermatology

## 2020-03-28 DIAGNOSIS — G3281 Cerebellar ataxia in diseases classified elsewhere: Secondary | ICD-10-CM | POA: Insufficient documentation

## 2020-03-28 DIAGNOSIS — L821 Other seborrheic keratosis: Secondary | ICD-10-CM

## 2020-03-28 DIAGNOSIS — L738 Other specified follicular disorders: Secondary | ICD-10-CM

## 2020-03-28 NOTE — Assessment & Plan Note (Signed)
Under the care of endocrinology  Lab Results  Component Value Date   TSH 2.87 03/07/2020    Plans to continue levoxyl 125 mcg daily

## 2020-03-28 NOTE — Assessment & Plan Note (Signed)
Doing better recently  Under care of psychiatrist  Arman Filter has made a difference

## 2020-03-28 NOTE — Progress Notes (Signed)
   Follow-Up Visit   Subjective  Lindsay Fry is a 60 y.o. female who presents for the following: sebaceous hyperplasia (Face, ).   The following portions of the chart were reviewed this encounter and updated as appropriate:       Review of Systems:  No other skin or systemic complaints except as noted in HPI or Assessment and Plan.  Objective  Well appearing patient in no apparent distress; mood and affect are within normal limits.  A focused examination was performed including face. Relevant physical exam findings are noted in the Assessment and Plan.  Objective  face: Numerous yellow lobulated paps with improvement noted from previous treatments  Objective  face: Waxy tan macules   Assessment & Plan  Sebaceous hyperplasia face  Improving with previous ED treatments face.  Additional treatment today.  Discussed cosmetic procedure, noncovered.  $60 for 1st lesion and $15 for each additional lesion if done on the same day.  Maximum charge $350.  One touch-up treatment included no charge. Discussed risks of treatment including dyspigmentation, small scar, and/or recurrence. Recommend daily broad spectrum sunscreen SPF 30+/photoprotection to treated areas once healed.   ED x >100 $350 cosmetic fee today  Destruction of lesion - face  Destruction method comment:  Electrodesiccation Informed consent: discussed and consent obtained   Timeout:  patient name, date of birth, surgical site, and procedure verified Patient was prepped and draped in usual sterile fashion: patient was prepped with isopropyl alcohol. Outcome: patient tolerated procedure well with no complications   Post-procedure details: wound care instructions given    Seborrheic keratosis face  Vs Lentigos  Discussed cosmetic LN2  Discussed OTC Inky Tranexamic acid and sunscreen  Return for as scheduled for TBSE, and shave removal of Fibrous pap nose.   I, Othelia Pulling, RMA, am acting as scribe for Brendolyn Patty, MD .  Documentation: I have reviewed the above documentation for accuracy and completeness, and I agree with the above.  Brendolyn Patty MD

## 2020-03-28 NOTE — Assessment & Plan Note (Signed)
Disc goals for lipids and reasons to control them (in the setting of CAD) Rev last labs with pt Rev low sat fat diet in detail Good control LDL of 46 Taking atorvastatin 80 mg daily and zetia 10 mg daily  Plan to change to repatha 140 mg if covered  Statin may be inc her LFTs

## 2020-03-28 NOTE — Assessment & Plan Note (Signed)
Reviewed health habits including diet and exercise and skin cancer prevention Reviewed appropriate screening tests for age  Also reviewed health mt list, fam hx and immunization status , as well as social and family history   See HPI Labs reviewed  Colonoscopy planned next month  Interested in Shingrix if it is covered  dexa utd and nl, one fall and no fx, taking vit D Mood is improved

## 2020-03-28 NOTE — Assessment & Plan Note (Signed)
Under care of cardiology No recent episodes

## 2020-03-28 NOTE — Assessment & Plan Note (Signed)
Recent dexa outside our office was WNL  Reassuring One fall w/o injury- having bp problems  No fractures Continues vit D Encourage exercise as tolerated

## 2020-03-28 NOTE — Assessment & Plan Note (Signed)
Discussed importance to bone and overall health  Plan to continue current supplementation

## 2020-03-28 NOTE — Patient Instructions (Signed)
Inky Tranexamic acid for dark spots on face   If you have any questions or concerns for your doctor, please call our main line at [phone] and press option 4 to reach your doctor's medical assistant. If no one answers, please leave a voicemail as directed and we will return your call as soon as possible. Messages left after 4 pm will be answered the following business day.   You may also send [place] a message via [facility]. We typically respond to MyChart messages within 1-2 business days.  For prescription refills, please ask your pharmacy to contact our office. Our fax number is [phone].  If you have an urgent issue when the clinic is closed that cannot wait until the next business day, you can page your doctor at the number below.    Please note that while we do our best to be available for urgent issues outside of office hours, we are not available 24/7.   If you have an urgent issue and are unable to reach [place], you may choose to seek medical care at your doctor's office, retail clinic, urgent care center, or emergency room.  If you have a medical emergency, please immediately call 911 or go to the emergency department.  Pager Numbers  - Dr. Nehemiah Massed: [phone]  - Dr. Laurence Ferrari: [phone]  - Dr. Nicole Kindred: [phone]  In the event of inclement weather, please call our main line at [phone] for an update on the status of any delays or closures.  Dermatology Medication Tips: Please keep the boxes that topical medications come in in order to help keep track of the instructions about where and how to use these. Pharmacies typically print the medication instructions only on the boxes and not directly on the medication tubes.   If your medication is too expensive, please contact our office at [phone] option 4 or send [place] a message through [place].   We are unable to tell what your co-pay for medications will be in advance as this is different depending on your insurance  coverage. However, we may be able to find a substitute medication at lower cost or fill out paperwork to get insurance to cover a needed medication.   If a prior authorization is required to get your medication covered by your insurance company, please allow [place] 1-2 business days to complete this process.  Drug prices often vary depending on where the prescription is filled and some pharmacies may offer cheaper prices.  The website www.goodrx.com contains coupons for medications through different pharmacies. The prices here do not account for what the cost may be with help from insurance (it may be cheaper with your insurance), but the website can give you the price if you did not use any insurance.  - You can print the associated coupon and take it with your prescription to the pharmacy.  - You may also stop by our office during regular business hours and pick up a GoodRx coupon card.  - If you need your prescription sent electronically to a different pharmacy, notify our office through [facility] or by phone at [phone] option 4.

## 2020-03-28 NOTE — Assessment & Plan Note (Signed)
Some improvement but balance is still poor One recent fall  Under care of neurology

## 2020-03-28 NOTE — Assessment & Plan Note (Signed)
bp in fair control at this time  BP Readings from Last 1 Encounters:  03/27/20 124/80   No changes needed Most recent labs reviewed  Disc lifstyle change with low sodium diet and exercise  Seeing cardiology for labile bp recently  Continues diltiazem ER 300 mg daily and benicar 10 mg daily (with cardizem 30 mg tid prn ) Pending advisement from cardiology

## 2020-03-28 NOTE — Assessment & Plan Note (Signed)
Under care of GI Has colonoscopy upcoming next month Stable with lialda currently

## 2020-03-28 NOTE — Assessment & Plan Note (Signed)
Suspect baseline fatty liver and some effect from high dose statin  Working on a change to [employer] against excessive acetaminophen or alcohol Continue to watch Under care of GI as well

## 2020-03-28 NOTE — Telephone Encounter (Signed)
LVM--repatha is covered by insurance and need to contact the cardiology regarding this medication.

## 2020-03-28 NOTE — Assessment & Plan Note (Signed)
Discussed how this problem influences overall health and the risks it imposes  Reviewed plan for weight loss with lower calorie diet (via better food choices and also portion control or program like weight watchers) and exercise building up to or more than 30 minutes 5 days per week including some aerobic activity   Wt loss noted with Semaglutide

## 2020-03-28 NOTE — Assessment & Plan Note (Signed)
Under the care of endocrinology  Lab Results  Component Value Date   HGBA1C 6.7 (H) 03/07/2020   Plans to continue levemir and jardiance and semaglutide Diet has improved and pt is loosing weight

## 2020-03-28 NOTE — Telephone Encounter (Signed)
Pt scheduled for 2 mo f/u with Elenor Quinones, PA for tomorrow 3/16 and cardiac CTA sch'd for 3/17.  Attempted to call pt to r/s appt for after CTA. No answer. Lmtcb.  Openings on Monday 3/21 with JV will discuss when pt returns call.

## 2020-03-28 NOTE — Assessment & Plan Note (Signed)
Vitamin B12 and vitamin D are adequately supplemented (will dec B12 to every other day for mildly high level)

## 2020-03-28 NOTE — Telephone Encounter (Signed)
Pt agreed to r/s appt for after CTA. Appt r/s to 3/21 at 1130.

## 2020-03-29 ENCOUNTER — Other Ambulatory Visit: Payer: Self-pay | Admitting: *Deleted

## 2020-03-29 ENCOUNTER — Ambulatory Visit: Payer: BC Managed Care – PPO | Admitting: Physician Assistant

## 2020-03-29 ENCOUNTER — Telehealth: Payer: Self-pay | Admitting: *Deleted

## 2020-03-29 ENCOUNTER — Telehealth (HOSPITAL_COMMUNITY): Payer: Self-pay | Admitting: Emergency Medicine

## 2020-03-29 ENCOUNTER — Other Ambulatory Visit: Payer: Self-pay | Admitting: Endocrinology

## 2020-03-29 MED ORDER — METOPROLOL TARTRATE 100 MG PO TABS
100.0000 mg | ORAL_TABLET | Freq: Once | ORAL | 0 refills | Status: DC
Start: 2020-03-29 — End: 2020-04-03

## 2020-03-29 NOTE — Telephone Encounter (Signed)
Pt stated have an appt with the cardiologist and will mention the Rx repatha medication.

## 2020-03-29 NOTE — Telephone Encounter (Signed)
Reaching out to patient to offer assistance regarding upcoming cardiac imaging study; pt verbalizes understanding of appt date/time, parking situation and where to check in, pre-test NPO status and medications ordered, and verified current allergies; name and call back number provided for further questions should they arise Marchia Bond RN [place] [facility] [phone] office [phone] cell  Pt states low BP this morning.  I requested that she check her BP in the morning prior to taking procedural dose of metoprolol and to call us if its low or there is any question whether she should take it. Pt verbalized understanding Clarise Cruz

## 2020-03-29 NOTE — Telephone Encounter (Signed)
Lindsay Fry from [place] requests to be called at ph# [phone] re: status of Appeal for Repatha

## 2020-03-29 NOTE — Telephone Encounter (Signed)
Pt will have appt with cardiologist tommorow.

## 2020-03-30 ENCOUNTER — Ambulatory Visit
Admission: RE | Admit: 2020-03-30 | Discharge: 2020-03-30 | Disposition: A | Discharge status: Home / Self Care | Payer: BC Managed Care – PPO | Source: Ambulatory Visit | Attending: Physician Assistant | Admitting: Physician Assistant

## 2020-03-30 ENCOUNTER — Other Ambulatory Visit: Payer: Self-pay

## 2020-03-30 DIAGNOSIS — R9431 Abnormal electrocardiogram [ECG] [EKG]: Secondary | ICD-10-CM | POA: Diagnosis not present

## 2020-03-30 DIAGNOSIS — I208 Other forms of angina pectoris: Secondary | ICD-10-CM | POA: Diagnosis not present

## 2020-03-30 DIAGNOSIS — I2089 Other forms of angina pectoris: Secondary | ICD-10-CM

## 2020-03-30 DIAGNOSIS — I2510 Atherosclerotic heart disease of native coronary artery without angina pectoris: Secondary | ICD-10-CM | POA: Diagnosis not present

## 2020-03-30 MED ORDER — SODIUM CHLORIDE 0.9 % IV BOLUS
250.0000 mL | Freq: Once | INTRAVENOUS | Status: AC
  Administered 2020-03-30: 250 mL via INTRAVENOUS

## 2020-03-30 MED ORDER — IOHEXOL 350 MG/ML SOLN
90.0000 mL | Freq: Once | INTRAVENOUS | Status: AC | PRN
  Administered 2020-03-30: 90 mL via INTRAVENOUS

## 2020-03-30 MED ORDER — DILTIAZEM HCL 25 MG/5ML IV SOLN
10.0000 mg | Freq: Once | INTRAVENOUS | Status: AC
  Administered 2020-03-30: 10 mg via INTRAVENOUS

## 2020-03-30 MED ORDER — NITROGLYCERIN 0.4 MG SL SUBL
0.8000 mg | SUBLINGUAL_TABLET | Freq: Once | SUBLINGUAL | Status: AC
  Administered 2020-03-30: 0.8 mg via SUBLINGUAL

## 2020-03-30 NOTE — Telephone Encounter (Signed)
Thank you for keeping me in the loop. --Including Darlyne Russian on this encounter, given she has recently spoken with the patient.  At that time, the patient reported that her blood pressure readings had normalized, so we did not change her over to [place].  --She does have a history of taking too much Cardizem documented per Christell Faith, PA-C -  This could be contributing, though we did review proper dosing at our visit.  Keep me in the loop.

## 2020-03-30 NOTE — Progress Notes (Signed)
Patient tolerated CT well. Drank water and coffee along with eating peanut butter crackers. Patient ambulated in hallway indicated some dizziness. IV fluids administered ambulated after steady gait without dizziness.  Vital signs stable encourage to drink water throughout day.Reasons explained and verbalized understanding.

## 2020-03-31 ENCOUNTER — Telehealth: Payer: Self-pay | Admitting: *Deleted

## 2020-03-31 NOTE — Telephone Encounter (Signed)
Results and recommendations reviewed with patient and she verbalized understanding with no further questions and appointment confirmed.

## 2020-03-31 NOTE — Telephone Encounter (Signed)
-----   Message from Arvil Chaco, PA-C sent at 03/31/2020 11:45 AM EDT ----- Coronary CT showed --Coronary calcium score of 105 or 80th percentile for women her age.  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 --Recommendation she keep her current appointment, at which time we can discuss further workup including repeat catheterization, based on these results. She should continue her current medications. We will be sure to control her cholesterol  with recent labs show her LDL was well controled.

## 2020-03-31 NOTE — Telephone Encounter (Signed)
Patient returning call.

## 2020-03-31 NOTE — Telephone Encounter (Signed)
Left voicemail message to call back for review of results.  

## 2020-04-01 ENCOUNTER — Other Ambulatory Visit: Payer: Self-pay | Admitting: Cardiovascular Disease

## 2020-04-01 DIAGNOSIS — I471 Supraventricular tachycardia: Secondary | ICD-10-CM

## 2020-04-01 DIAGNOSIS — I493 Ventricular premature depolarization: Secondary | ICD-10-CM | POA: Diagnosis not present

## 2020-04-03 ENCOUNTER — Encounter: Payer: Self-pay | Admitting: Physician Assistant

## 2020-04-03 ENCOUNTER — Other Ambulatory Visit: Payer: Self-pay

## 2020-04-03 ENCOUNTER — Ambulatory Visit (INDEPENDENT_AMBULATORY_CARE_PROVIDER_SITE_OTHER): Payer: BC Managed Care – PPO | Admitting: Physician Assistant

## 2020-04-03 VITALS — BP 98/62 | HR 46 | Ht 65.5 in | Wt 189.0 lb

## 2020-04-03 DIAGNOSIS — E1159 Type 2 diabetes mellitus with other circulatory complications: Secondary | ICD-10-CM

## 2020-04-03 DIAGNOSIS — I5189 Other ill-defined heart diseases: Secondary | ICD-10-CM

## 2020-04-03 DIAGNOSIS — Z87891 Personal history of nicotine dependence: Secondary | ICD-10-CM

## 2020-04-03 DIAGNOSIS — I25119 Atherosclerotic heart disease of native coronary artery with unspecified angina pectoris: Secondary | ICD-10-CM

## 2020-04-03 DIAGNOSIS — I493 Ventricular premature depolarization: Secondary | ICD-10-CM

## 2020-04-03 DIAGNOSIS — Z8616 Personal history of COVID-19: Secondary | ICD-10-CM

## 2020-04-03 DIAGNOSIS — R9431 Abnormal electrocardiogram [ECG] [EKG]: Secondary | ICD-10-CM

## 2020-04-03 DIAGNOSIS — R931 Abnormal findings on diagnostic imaging of heart and coronary circulation: Secondary | ICD-10-CM

## 2020-04-03 DIAGNOSIS — I10 Essential (primary) hypertension: Secondary | ICD-10-CM

## 2020-04-03 DIAGNOSIS — R42 Dizziness and giddiness: Secondary | ICD-10-CM | POA: Diagnosis not present

## 2020-04-03 DIAGNOSIS — I498 Other specified cardiac arrhythmias: Secondary | ICD-10-CM

## 2020-04-03 DIAGNOSIS — I252 Old myocardial infarction: Secondary | ICD-10-CM

## 2020-04-03 DIAGNOSIS — E785 Hyperlipidemia, unspecified: Secondary | ICD-10-CM

## 2020-04-03 MED ORDER — OLMESARTAN MEDOXOMIL 5 MG PO TABS: 5.0000 mg | ORAL_TABLET | Freq: Every day | ORAL | 5 refills | Status: DC

## 2020-04-03 MED ORDER — NITROGLYCERIN 0.4 MG SL SUBL: 0.4000 mg | SUBLINGUAL_TABLET | SUBLINGUAL | 4 refills | Status: DC | PRN

## 2020-04-03 NOTE — Patient Instructions (Signed)
Medication Instructions:  Your physician has recommended you make the following change in your medication:   DECREASE Benicar to 12m once DAILY  *If you need a refill on your cardiac medications before your next appointment, please call your pharmacy*   Lab Work: None ordered   Testing/Procedures:  You are scheduled for a Cardiac Catheterization on Thursday, March 24 with Dr. TIda Rogue  1. [facility] (This time is one hour before your procedure to ensure your preparation). Free valet parking service is available.   Special note: Every effort is made to have your procedure done on time. Please understand that emergencies sometimes delay scheduled procedures.  2. Diet: Do not eat solid foods after midnight.  The patient may have clear liquids until 5am upon the day of the procedure.  3. Labs: See Covid Test instructions below.   4. Medication instructions in preparation for your procedure:   Take HALF dose of Insuline the evening before your procedure (if you take insulin in the evening). DO NOT take any insulin the MORNING of procedure.  HOLD all Diabetic Medications the morning of procedure: Jardiance  On the morning of your procedure, take your Plavix/Clopidogrel and any morning medicines NOT listed above.  You may use sips of water.  5. Plan for one night stay--bring personal belongings. 6. Bring a current list of your medications and current insurance cards. 7. You MUST have a responsible person to drive you home. 8. Someone MUST be with you the first 24 hours after you arrive home or your discharge will be delayed. 9. Please wear clothes that are easy to get on and off and wear slip-on shoes.  Thank you for allowing [place] [facility] for you!   -- [facility]  COVID PRE- TEST: You will need a COVID TEST prior to the procedure:   LOCATION: Pre-Admit testing office, [address] [place]                                  [place]                                  [place] [address] [place] [id number]  DATE/TIME:  Tuesday 04/04/20 (anytime between 8 am and 2 pm)    Follow-Up: [facility] you and your health needs are our priority.  As part of our continuing mission to provide you with exceptional heart care, we have created designated Provider Care Teams.  These Care Teams include your primary Cardiologist (physician) and Advanced Practice Providers (APPs -  Physician Assistants and Nurse Practitioners) who all work together to provide you with the care you need, when you need it.  We recommend signing up for the patient portal called "MyChart".  Sign up information is provided on this After Visit Summary.  MyChart is used to connect with patients for Virtual Visits (Telemedicine).  Patients are able to view lab/test results, encounter notes, upcoming appointments, etc.  Non-urgent messages can be sent to your provider as well.   To learn more about what you can do with MyChart, go to [email or web address]    Your next appointment:    Follow up after cath procedure  The format for  your next appointment:   In Person  Provider:   You may see Ida Rogue, MD or one of the following Advanced Practice Providers on your designated Care Team:    Murray Hodgkins, NP  Christell Faith, PA-C  Marrianne Mood, PA-C  Cadence Winchester, [place]  Laurann [place], NP

## 2020-04-03 NOTE — Progress Notes (Signed)
Office Visit    Patient Name: Lindsay Fry Date of Encounter: 04/03/2020  PCP:  Abner Greenspan, MD   [place]  Cardiologist:  Ida Rogue, MD  Advanced Practice Provider:  No care team member to display Electrophysiologist:  None   Chief Complaint    Chief Complaint  Patient presents with  . Follow-up    2 weeks--after CTA    With history of CAD s/p non-STEMI 08/2014, hypertension, hyperlipidemia, first-degree AV block, PACs, PVCs, HFpEF, bilateral carotid dz, COVID-19 02/18/2020, DM 2, hypothyroidism, bipolar disorder, anxiety, fibromyalgia, migraine disorder, left-sided breast cancer s/p bilateral mastectomy, prior history of smoking (quit 1995), and ulcerative colitis, and who presents for follow-up with recent [facility] visit.  Past Medical History    Past Medical History:  Diagnosis Date  . ADD (attention deficit disorder) 11/19/2012  . Allergic rhinitis, cause unspecified   . Anxiety state, unspecified   . Arthritis   . Asymptomatic postmenopausal status (age-related) (natural)   . Atypical hyperplasia of left breast 2000  . Atypical mole 03/16/2018   R paraspinal mid upper back, excision  . Atypical mole 02/17/2018   R spinal upper back, moderate  . Atypical mole 08/11/2017   L sternum, excision  . Atypical mole 05/27/2017   L post neck, moderate  . Benign neoplasm of other and unspecified site of the digestive system   . Bipolar disorder ([place])   . Breast cancer ([place])    Left Breast 69m invasive CA left uiq, dcis left uoq and a lot ADH, ALH in both breasts.  .Marland KitchenCAD (coronary artery disease)    a. 08/2014 NSTEMI/Cath: LM nl, LAD 50p, D1/D2 min irregs, LCX nl, OM1 40, LPDA nl, RI 99ost (2.038mvessel->med Rx), RCA nl, AM 60, nl EF; b. 11/2016 MV: EF 74%, no ischemia (performed 2/2 dyspnea & new inf TWI).  . Marland Kitchenarcinoma of left breast ([place]/19/2016  . Cataract   . Chronic diastolic CHF (congestive heart failure) ([place]   a. 08/2014 Echo:  EF 60-65%, Gr 1 DD, nl LA.  . Marland Kitchenlotting disorder (HCAsh[place]   on blood thiner, Plavix  . Cyst    on Achilles tendon  . Depression   . Diabetes mellitus (HCP[place]   frank  . Dysuria   . Edema   . Family history of malignant neoplasm of gastrointestinal tract   . Fibromyalgia   . GERD (gastroesophageal reflux disease)   . Glaucoma    pre glaucoma, 05/14/2018 pt. denies glaucoma  . Headache    migraines  . Hiatal hernia   . History of alcoholism (HCM[place]  . History of migraines   . History of ovarian cyst   . Hx of adenomatous colonic polyps   . Insomnia, unspecified   . Myocardial infarction ([facility]8-21-16  . OCD (obsessive compulsive disorder)   . Osteopenia 09/25/2016   Femoral neck T -1.1  9/18  . Other screening mammogram   . Pure hypercholesterolemia   . Rosacea   . Subacute confusional state 11/19/2012  . Tubular adenoma of colon 01/10/11  . Ulcerative colitis, unspecified   . Unspecified asthma(493.90)   . Unspecified essential hypertension   . Unspecified hypothyroidism   . Unspecified vitamin D deficiency   . Vertigo    Past Surgical History:  Procedure Laterality Date  . APPENDECTOMY    . AXILLARY SENTINEL NODE BIOPSY Left 05/23/2014   Procedure: AXILLARY SENTINEL NODE BIOPSY;  Surgeon: SeChristene LyeMD;  Location: [facility] ORS;  Service: General;  Laterality: Left;  . BREAST BIOPSY  Aug 2000   on tamoxifen, atypical hyperplasia  . BREAST BIOPSY Left 04/2014  . BREAST SURGERY Left Aug 2000   lumpectomy/ Dr Sharlet Salina  . BREAST SURGERY Bilateral 05/23/14   Mastectomy  . BUNIONECTOMY Right   . CARDIAC CATHETERIZATION N/A 09/05/2014   Procedure: Left Heart Cath and Coronary Angiography;  Surgeon: Wellington Hampshire, MD;  Location: [place] [facility];  Service: Cardiovascular;  Laterality: N/A;  . CARDIAC CATHETERIZATION  09-05-14  . CATARACT EXTRACTION W/ INTRAOCULAR LENS IMPLANT Bilateral   . CESAREAN SECTION  1996/1997   placenta previa, gest DM, pre-eclampsia  .  CHOLECYSTECTOMY  1997   adhesions also  . COLONOSCOPY  01/2001   Ulcerative colitis  . COLONOSCOPY  04/2004   UC, polyp  . COLONOSCOPY  12/08   UC, no polyps  . DEXA  04/1999 and 2010   normal  . ESOPHAGOGASTRODUODENOSCOPY  09/2001   polyp  . exercise stress test  11/2003   negative  . FEMUR FRACTURE SURGERY Left   . MASTECTOMY Bilateral   . MECKEL DIVERTICULUM EXCISION  1999  . NASAL SINUS SURGERY  05/1997  . nuclear stress test  06/2007   negative  . precancerous mole removed    . SIMPLE MASTECTOMY WITH AXILLARY SENTINEL NODE BIOPSY Bilateral 05/23/2014   Procedure: Bilateral simple mastectomy, left sentinel node biopsy ;  Surgeon: Christene Lye, MD;  Location: [facility] ORS;  Service: General;  Laterality: Bilateral;  . Sleep study  11/09   no apnea, but did snore (done by [facility])  . TONSILLECTOMY  1984  . TUBAL LIGATION    . WISDOM TOOTH EXTRACTION  1980's    Allergies  Allergies  Allergen Reactions  . Ephedrine Other (See Comments)    Pt becomes hyper  . Aspartame And Phenylalanine Nausea Only  . Aspirin     REACTION: aggrivates colitis  . Crestor [Rosuvastatin]     REACTION: increased lfts  . Erythromycin     REACTION: GI upset  . Nsaids     REACTION: aggrivate colitis  . Oxycodone Other (See Comments)    Panic Attack    History of Present Illness    Lindsay Fry is a 60 y.o. female with PMH as above.  She is a previous smoker, reportedly quitting in 1995.  She has history of non-STEMI 08/2014, hypertension, hyperlipidemia, HFpEF, DM 2, hypothyroidism, bipolar disorder, anxiety, fibromyalgia, migraine disorder, left-sided breast cancer s/p bilateral mastectomy, and ulcerative colitis, and is seen today for hospital follow-up.  Her cardiac history dates back to 2016, when she was admitted to the hospital with non-STEMI.  Catheterization showed severe ostial ramus intermedius disease, which was medically managed.  She otherwise had nonobstructive CAD.  Echo  showed EF 60 to 65%, G1 DD.  Lower extremity ABIs in 2016 normal.  Zio patch from 2018 showed predominantly NSR with average heart rate 76 bpm, first-degree AV block, rare PACs, atrial couplets, PVCs, and ventricular triplets.  No significant arrhythmias were noted.    Seen 10/2016 with dyspnea on exertion and was found to have new anterior T wave changes.  Subsequent 2018 nuclear stress test was without evidence of ischemia.    She later described chest pressure during her virtual visit in 2020.  This was attributed to stress and anxiety.  BP was elevated at the time and she was advised to take short acting diltiazem as needed for SBP  over 150 mmHg.  No further cardiac work-up was recommended.  She reportedly developed gait instability and memory disturbance late 2020 and was evaluated by neurology 01/2019.  MRI of the brain 02/2019 was unrevealing.  Carotid Dopplers normal 08/12/2019.   When seen last in clinic 12/2019, she noted increasing episodes of dizziness and double vision.  She saw her PCP 11/2019, who attributed her dizziness to vertigo and possible polypharmacy.  She was referred to ophthalmology and neurology.  It was noted at her last visit that she had almost weekly episodes where she felt as if the room was spinning.  She had fallen twice due to blurred vision.  Episodes lasted for approximately 30 minutes and resolved with meclizine.  Episodes were noted to be similar to those occurring in 2014.  She had elevated blood pressure at home x2 days with readings showing SBP 160s and DBP is 100.  She took as needed diltiazem 3 times overnight/over a period of 6 hours.  BP was noted to be elevated during clinic.  Diltiazem was therefore increased to 30 mg daily.  It was noted she was not on ASA secondary to colitis.  Referral was placed to PT for vestibular rehabilitation.  It was noted Repatha should be considered at follow-up.  She reportedly tested positive for COVID-19 02/18/2020.  She reports a  history of being vaccinated with [facility] [place] in June/2021.  On 03/02/2020, she presented to [facility] emergency department for symptoms of bradycardia, dyspnea, and presyncope.  She also noted some chest pain, rated mild in severity.  On review of EMR, she reported heart rate between 35 and 40 bpm over the last 48 hours.  She reported that symptoms started late 2/15 (Tuesday) afternoon and continued throughout the morning of 2/17.  She decided to present to [facility] hospital [facility]/16 but subsequently left after realizing the [facility] ED was overcrowded.  She presented again 2/17 and was seen in the ED.  Initial vitals significant for BP well controlled at 123/76 and HR 93 bpm.  Labs showed potassium 2.9, glucose 137, hemoglobin 16.2, hematocrit 47.3.  High-sensitivity troponin negative.  EKG showed NSR with first-degree AV block, IVCD,  no significant ST/T wave abnormality.  Chest x-ray was without active cardiopulmonary disease.  Streaky atelectasis or scar was noted at the left base of her lung.  CT was without evidence of PE.  Cardiomegaly with coronary artery calcification was noted, as well as low lung volumes with mosaic appearance of the lung parenchyma bilaterally which may be secondary to low lung volumes versus small airway disease.  Aortic atherosclerosis is noted.  She received potassium repletion for her hypokalemia.  Given her low lung volumes, inhaler versus short course of steroid was discussed.  She was discharged with prescription for prednisone 40 mg tapering, albuterol, and potassium 10 M EQ daily.  Seen 03/10/2020 and recalling previous emergency department visits as outlined above.  She reported SOB with frequent use of the albuterol inhaler every 6 hours.  We discussed the rescue inhaler should not be used this frequently, especially as it will exacerbate any underlying tachycardia.  She reported chest tightness and cough. BP 140/80 with HR 86 bpm (actually improved from previous clinic visit of 150/96).  Weight  decreased from previous clinic weight at 207 pounds  199lbs.  Today, 04/03/2020, she returns to clinic and notes an episode of lightheadedness.  She also reports an episode of strong heartbeat/palpitations.  She denies any frank chest pain.  She reports that she did had  a an episode of discomfort that lasted less for a minute/10 seconds at the most.  She reports breathing stable.  She has not had to use her inhaler.  She has not had to use her walker.  She reports nausea, usually related to food or time of day.  She has been losing weight, she has not been as interested in food. She reports nausea for years.  Cardiac CT results recommended cardiac catheterization, discussed at length today.  Home Medications    Current Outpatient Medications on File Prior to Visit  Medication Sig Dispense Refill  . acetaminophen (TYLENOL) 650 MG CR tablet Take 1,300 mg by mouth 2 (two) times daily.    Marland Kitchen amantadine (SYMMETREL) 100 MG capsule Take 100 mg by mouth 2 (two) times daily.    . Ascorbic Acid (VITAMIN C PO) Take by mouth daily.    Marland Kitchen atorvastatin (LIPITOR) 80 MG tablet TAKE 1 TABLET(80 MG) BY MOUTH DAILY 90 tablet 1  . B Complex Vitamins (VITAMIN B COMPLEX PO) Take by mouth every other day.    Marland Kitchen BAYER MICROLET LANCETS lancets USE AS DIRECTED TO CHECK BLOOD SUGAR TWICE DAILY 200 each 2  . Blood Glucose Monitoring Suppl (CONTOUR NEXT MONITOR) w/Device KIT 1 each by Does not apply route in the morning and at bedtime. Use Contour Next meter to check blood sugar twice daily. 1 kit 0  . Calcium Carb-Cholecalciferol 600-800 MG-UNIT TABS Take 1 tablet by mouth daily.    . cholecalciferol (VITAMIN D3) 25 MCG (1000 UNIT) tablet Take 1,000 Units by mouth daily.    Marland Kitchen co-enzyme Q-10 50 MG capsule Take 50 mg by mouth daily.    . diazepam (VALIUM) 10 MG tablet Take 2.5-10 mg by mouth as needed for anxiety.    . docusate sodium (COLACE) 100 MG capsule Take by mouth. 3 in the morning and 2 at bedtime    . ezetimibe (ZETIA)  10 MG tablet TAKE 1 TABLET(10 MG) BY MOUTH DAILY 90 tablet 3  . famotidine (PEPCID) 40 MG tablet Take 40 mg by mouth 2 (two) times daily.    Marland Kitchen gabapentin (NEURONTIN) 300 MG capsule TAKE 1 CAPSULE(300 MG) BY MOUTH THREE TIMES DAILY 90 capsule 11  . glucose blood (CONTOUR NEXT TEST) test strip Use as instructed 100 each 12  . insulin detemir (LEVEMIR FLEXTOUCH) 100 UNIT/ML FlexPen Inject 60 Units into the skin every evening.    . Insulin Pen Needle (B-D UF III MINI PEN NEEDLES) 31G X 5 MM MISC USE FOR INJECTIONS TWICE DAILY 100 each 12  . JARDIANCE 25 MG TABS tablet TAKE 1 TABLET(25 MG) BY MOUTH DAILY 30 tablet 2  . ketoconazole (NIZORAL) 2 % cream Apply to affected areas one to two times daily as needed. 60 g 2  . Lactobacillus-Inulin (CULTURELLE DIGESTIVE DAILY PO) Take 1 capsule by mouth daily.    Marland Kitchen lamoTRIgine (LAMICTAL) 200 MG tablet Take 400 mg by mouth daily.    Marland Kitchen latanoprost (XALATAN) 0.005 % ophthalmic solution Place 1 drop into both eyes 2 (two) times a week.     Marland Kitchen LEVOXYL 125 MCG tablet Take 2 tablets (250 mcg total) by mouth daily before breakfast. 60 tablet 3  . linaclotide (LINZESS) 290 MCG CAPS capsule Take 290 mcg by mouth as needed.    . loperamide (IMODIUM A-D) 2 MG tablet Take 2 mg by mouth as needed for diarrhea or loose stools.    . Melatonin 10 MG CAPS Take 1 capsule by mouth at bedtime.    Marland Kitchen  mesalamine (LIALDA) 1.2 g EC tablet TAKE 2 TABLETS BY MOUTH DAILY 60 tablet 2  . Multiple Vitamin (MULTIVITAMIN) tablet Take 1 tablet by mouth daily.    . ondansetron (ZOFRAN-ODT) 4 MG disintegrating tablet DISSOLVE 1 TABLET(4 MG) ON THE TONGUE EVERY 6 HOURS AS NEEDED FOR NAUSEA OR VOMITING 90 tablet 1  . pantoprazole (PROTONIX) 40 MG tablet TAKE 1 TABLET(40 MG) BY MOUTH TWICE DAILY 60 tablet 6  . QUEtiapine (SEROQUEL XR) 400 MG 24 hr tablet Take 800 mg by mouth at bedtime.    . Semaglutide, 1 MG/DOSE, (OZEMPIC, 1 MG/DOSE,) 4 MG/3ML SOPN INJECT 1MG INTO THE SKIN ONCE A WEEK 3 mL 3  .  Simethicone (GAS-X PO) Take 1 tablet by mouth 4 (four) times daily.    . valACYclovir (VALTREX) 1000 MG tablet TAKE 1 TABLET(1000 MG) BY MOUTH DAILY 30 tablet 3  . VRAYLAR capsule Take 3 mg by mouth at bedtime.    Marland Kitchen VYVANSE 50 MG capsule Take 50 mg by mouth every morning.    . zaleplon (SONATA) 10 MG capsule Take 20 mg by mouth at bedtime.    . clopidogrel (PLAVIX) 75 MG tablet TAKE 1 TABLET(75 MG) BY MOUTH DAILY 90 tablet 0   No current facility-administered medications on file prior to visit.    Review of Systems    She denies palpitations, pnd, orthopnea, n, v, syncope, edema, weight gain, or early satiety.  She reports an episode of bradycardic heart rates.  She reports chest tightness and dyspnea/shortness of breath. She has dizziness. She reports a previous cough that improved with her albuterol and prednisone.  She reports chest tightness also improves with albuterol and prednisone.  All other systems reviewed and are otherwise negative except as noted above.  Physical Exam    VS:  BP 98/62   Pulse (!) 46   Ht 5' 5.5" (1.664 m)   Wt 189 lb (85.7 kg)   LMP 08/23/2006 (Approximate) Comment: tubal ligation  BMI 30.97 kg/m  , BMI Body mass index is 30.97 kg/m. GEN: Well nourished, well developed, in no acute distress. HEENT: normal. Neck: Supple, no JVD, carotid bruits, or masses. Cardiac: RRR with extrasystole, no murmurs, rubs, or gallops. No clubbing, cyanosis, edema.  Radials/DP/PT 2+ and equal bilaterally.  Respiratory:  Respirations regular and unlabored, clear to auscultation bilaterally. GI: Soft, nontender, nondistended, BS + x 4. MS: no deformity or atrophy. Skin: warm and dry, no rash. Neuro:  Strength and sensation are intact. Psych: Normal affect.  Accessory Clinical Findings    ECG personally reviewed by me today - sinus rhythm with frequent PVCs/bigeminy, 85bpm, left axis deviation, IVCD with QRS 57m, PRi 188 ms, T wave inversion new in lateral leads I, V4 ,  QTC 449,- no acute changes.  VITALS Reviewed today   Temp Readings from Last 3 Encounters:  03/27/20 (!) 96.9 F (36.1 C) (Temporal)  03/07/20 (!) 96.9 F (36.1 C) (Temporal)  03/02/20 98.1 F (36.7 C) (Oral)   BP Readings from Last 3 Encounters:  04/03/20 98/62  03/30/20 112/78  03/27/20 124/80   Pulse Readings from Last 3 Encounters:  04/03/20 (!) 46  03/30/20 68  03/27/20 95    Wt Readings from Last 3 Encounters:  04/03/20 189 lb (85.7 kg)  03/30/20 193 lb (87.5 kg)  03/27/20 193 lb 3 oz (87.6 kg)     LABS  reviewed today   Lab Results  Component Value Date   WBC 8.0 03/24/2020   HGB 14.8 03/24/2020  HCT 43.6 03/24/2020   MCV 91.5 03/24/2020   PLT 302.0 03/24/2020   Lab Results  Component Value Date   CREATININE 0.83 03/24/2020   BUN 15 03/24/2020   NA 143 03/24/2020   K 4.4 03/24/2020   CL 109 03/24/2020   CO2 27 03/24/2020   Lab Results  Component Value Date   ALT 70 (H) 03/24/2020   AST 60 (H) 03/24/2020   ALKPHOS 123 (H) 03/24/2020   BILITOT 0.5 03/24/2020   Lab Results  Component Value Date   CHOL 120 03/07/2020   HDL 40.30 03/07/2020   LDLCALC 46 03/07/2020   LDLDIRECT 125.0 02/07/2020   TRIG 172.0 (H) 03/07/2020   CHOLHDL 3 03/07/2020    Lab Results  Component Value Date   HGBA1C 6.7 (H) 03/07/2020   Lab Results  Component Value Date   TSH 2.87 03/07/2020     STUDIES/PROCEDURES reviewed today   US Carotid 07/2019 Summary:  Right Carotid: Velocities in the right ICA are consistent with a 1-39%  stenosis.  Vertebrals: Bilateral vertebral arteries demonstrate antegrade flow.  Subclavians: Normal flow hemodynamics were seen in bilateral subclavian        arteries.    2018 MPI Pharmacological myocardial perfusion imaging study with no significant  ischemia Normal wall motion, EF estimated at 74% No EKG changes concerning for ischemia at peak stress or in recovery. Low risk scan  2018 Ambulatory Cardiac  monitoring Event monitor Normal sinus rhythm Min HR of 56 bpm, max HR of 133 bpm, and avg HR of 76 bpm.  First Degree AV Block was present. Isolated SVEs were rare (<1.0%), SVE Couplets were rare (<1.0%), and no SVE Triplets were present.  Isolated VEs were rare (<1.0%, 766), VE Triplets were rare (<1.0%, 1), and no VE Couplets were present.   2016 Echo - Left ventricle: The cavity size was normal. Systolic function was  normal. The estimated ejection fraction was in the range of 60%  to 65%. Wall motion was normal; there were no regional wall  motion abnormalities. Doppler parameters are consistent with  abnormal left ventricular relaxation (grade 1 diastolic  dysfunction).  - Left atrium: The atrium was normal in size.  - Right ventricle: Systolic function was normal.  - Pulmonary arteries: Systolic pressure was within the normal  range.   2016 ABIs Normal ABIs  2016 LHC  Acute Mrg lesion, 60% stenosed.  Ost Ramus to Ramus lesion, 99% stenosed.  Prox LAD lesion, 50% stenosed.  1st Mrg lesion, 40% stenosed. 1. Severe one-vessel coronary artery disease. The culprit for myocardial infarction this subtotal occlusion of the ostial ramus. This vessel is medium size and the diameter is about 2 mm. 2. Normal LV systolic function and mildly elevated left ventricular end-diastolic pressure. Recommendations: The ostial ramus stenosis is in a location where placing a stent might compromise the flow to the LAD or left circumflex. Doing balloon angioplasty alone would probably not have good long-term results given that the diameter is about 2 mm. Thus, I think the best option is medical therapy for now. I increased the dose of atorvastatin and placed the patient on Brilinta to be taken for one year. Recommend aggressive control of risk factors and blood pressure control.   Assessment & Plan    Chest tightness  CAD s/p NSTEMI 08/2019 --No current chest pain. EKG at previous  visit with new lateral TWI and ectopy. Suspicion was ectopy as the likely reason for her bradycardic and hypotensive readings at home, given  this will throw off BP / HR cuff rate measurements.  2016 cath with severe ostial ramus intermedius disease, managed medically.  2018 MPI without evidence of ischemia.  Given her chest tightness and dyspnea reported at previous visit, as well as her frequent ectopy and new lateral T wave inversion on EKG, cardiac CT recommended with results indicating catheterization recommedned  Continue current medical therapy.  No ASA secondary to colitis.  She is on Plavix 75 mg daily.  Continue long-acting Cardizem 300 mg daily with as needed short acting Cardizem for SBP over 150.  Aggressive risk factor modification.  Frequent ectopy on EKG Pt Reported bradycardia --As above with repeat EKG today. Pending cardiac cath.   Essential HTN, goal BP less than 130/80 --Continue current medications..   Chronic HFpEF/G1DD --Euvolemic and well compensated on exam. Continue current medications.  Hyperlipidemia, goal LDL less than 70 --Continue current PCSK9i versus statin and Zetia as all 3 are listed on her medications list. She reports she is no longer taking Repatha due to insurance issues.  2/22 labs show LDL 46 and total cholesterol 120.  Dizziness/vertigo --History of dizziness/vertigo/double vision. Will still reduce Benicar as discussed.  Further recommendations pending cath.  Carotid artery disease, mild --Denies any signs or symptoms of worsening carotid artery disease.  No reported amaurosis fugax.  She does report dizziness.  At RTC, could consider repeat carotid studies.  Will defer for now and preference to first workup ischemia, which is reasonable.  DM2 --03/07/2020 A1c 6.7.  Ongoing glycemic control recommended for risk factor modification.  Managed per endocrinology.  Medication changes: Reduced Benicar 35m daily Disposition: RTC after cath   JArvil Chaco PA-C 04/03/2020

## 2020-04-03 NOTE — H&P (View-Only) (Signed)
Office Visit    Patient Name: Lindsay Fry Date of Encounter: 04/03/2020  PCP:  Abner Greenspan, MD   [place]  Cardiologist:  Ida Rogue, MD  Advanced Practice Provider:  No care team member to display Electrophysiologist:  None   Chief Complaint    Chief Complaint  Patient presents with  . Follow-up    2 weeks--after CTA    With history of CAD s/p non-STEMI 08/2014, hypertension, hyperlipidemia, first-degree AV block, PACs, PVCs, HFpEF, bilateral carotid dz, COVID-19 02/18/2020, DM 2, hypothyroidism, bipolar disorder, anxiety, fibromyalgia, migraine disorder, left-sided breast cancer s/p bilateral mastectomy, prior history of smoking (quit 1995), and ulcerative colitis, and who presents for follow-up with recent [facility] visit.  Past Medical History    Past Medical History:  Diagnosis Date  . ADD (attention deficit disorder) 11/19/2012  . Allergic rhinitis, cause unspecified   . Anxiety state, unspecified   . Arthritis   . Asymptomatic postmenopausal status (age-related) (natural)   . Atypical hyperplasia of left breast 2000  . Atypical mole 03/16/2018   R paraspinal mid upper back, excision  . Atypical mole 02/17/2018   R spinal upper back, moderate  . Atypical mole 08/11/2017   L sternum, excision  . Atypical mole 05/27/2017   L post neck, moderate  . Benign neoplasm of other and unspecified site of the digestive system   . Bipolar disorder ([place])   . Breast cancer ([place])    Left Breast 69m invasive CA left uiq, dcis left uoq and a lot ADH, ALH in both breasts.  .Marland KitchenCAD (coronary artery disease)    a. 08/2014 NSTEMI/Cath: LM nl, LAD 50p, D1/D2 min irregs, LCX nl, OM1 40, LPDA nl, RI 99ost (2.038mvessel->med Rx), RCA nl, AM 60, nl EF; b. 11/2016 MV: EF 74%, no ischemia (performed 2/2 dyspnea & new inf TWI).  . Marland Kitchenarcinoma of left breast ([place]/19/2016  . Cataract   . Chronic diastolic CHF (congestive heart failure) ([place]   a. 08/2014 Echo:  EF 60-65%, Gr 1 DD, nl LA.  . Marland Kitchenlotting disorder (HCAsh[place]   on blood thiner, Plavix  . Cyst    on Achilles tendon  . Depression   . Diabetes mellitus (HCP[place]   frank  . Dysuria   . Edema   . Family history of malignant neoplasm of gastrointestinal tract   . Fibromyalgia   . GERD (gastroesophageal reflux disease)   . Glaucoma    pre glaucoma, 05/14/2018 pt. denies glaucoma  . Headache    migraines  . Hiatal hernia   . History of alcoholism (HCM[place]  . History of migraines   . History of ovarian cyst   . Hx of adenomatous colonic polyps   . Insomnia, unspecified   . Myocardial infarction ([facility]8-21-16  . OCD (obsessive compulsive disorder)   . Osteopenia 09/25/2016   Femoral neck T -1.1  9/18  . Other screening mammogram   . Pure hypercholesterolemia   . Rosacea   . Subacute confusional state 11/19/2012  . Tubular adenoma of colon 01/10/11  . Ulcerative colitis, unspecified   . Unspecified asthma(493.90)   . Unspecified essential hypertension   . Unspecified hypothyroidism   . Unspecified vitamin D deficiency   . Vertigo    Past Surgical History:  Procedure Laterality Date  . APPENDECTOMY    . AXILLARY SENTINEL NODE BIOPSY Left 05/23/2014   Procedure: AXILLARY SENTINEL NODE BIOPSY;  Surgeon: SeChristene LyeMD;  Location: [facility] ORS;  Service: General;  Laterality: Left;  . BREAST BIOPSY  Aug 2000   on tamoxifen, atypical hyperplasia  . BREAST BIOPSY Left 04/2014  . BREAST SURGERY Left Aug 2000   lumpectomy/ Dr Sharlet Salina  . BREAST SURGERY Bilateral 05/23/14   Mastectomy  . BUNIONECTOMY Right   . CARDIAC CATHETERIZATION N/A 09/05/2014   Procedure: Left Heart Cath and Coronary Angiography;  Surgeon: Wellington Hampshire, MD;  Location: [place] [facility];  Service: Cardiovascular;  Laterality: N/A;  . CARDIAC CATHETERIZATION  09-05-14  . CATARACT EXTRACTION W/ INTRAOCULAR LENS IMPLANT Bilateral   . CESAREAN SECTION  1996/1997   placenta previa, gest DM, pre-eclampsia  .  CHOLECYSTECTOMY  1997   adhesions also  . COLONOSCOPY  01/2001   Ulcerative colitis  . COLONOSCOPY  04/2004   UC, polyp  . COLONOSCOPY  12/08   UC, no polyps  . DEXA  04/1999 and 2010   normal  . ESOPHAGOGASTRODUODENOSCOPY  09/2001   polyp  . exercise stress test  11/2003   negative  . FEMUR FRACTURE SURGERY Left   . MASTECTOMY Bilateral   . MECKEL DIVERTICULUM EXCISION  1999  . NASAL SINUS SURGERY  05/1997  . nuclear stress test  06/2007   negative  . precancerous mole removed    . SIMPLE MASTECTOMY WITH AXILLARY SENTINEL NODE BIOPSY Bilateral 05/23/2014   Procedure: Bilateral simple mastectomy, left sentinel node biopsy ;  Surgeon: Christene Lye, MD;  Location: [facility] ORS;  Service: General;  Laterality: Bilateral;  . Sleep study  11/09   no apnea, but did snore (done by [facility])  . TONSILLECTOMY  1984  . TUBAL LIGATION    . WISDOM TOOTH EXTRACTION  1980's    Allergies  Allergies  Allergen Reactions  . Ephedrine Other (See Comments)    Pt becomes hyper  . Aspartame And Phenylalanine Nausea Only  . Aspirin     REACTION: aggrivates colitis  . Crestor [Rosuvastatin]     REACTION: increased lfts  . Erythromycin     REACTION: GI upset  . Nsaids     REACTION: aggrivate colitis  . Oxycodone Other (See Comments)    Panic Attack    History of Present Illness    Lindsay Fry is a 60 y.o. female with PMH as above.  She is a previous smoker, reportedly quitting in 1995.  She has history of non-STEMI 08/2014, hypertension, hyperlipidemia, HFpEF, DM 2, hypothyroidism, bipolar disorder, anxiety, fibromyalgia, migraine disorder, left-sided breast cancer s/p bilateral mastectomy, and ulcerative colitis, and is seen today for hospital follow-up.  Her cardiac history dates back to 2016, when she was admitted to the hospital with non-STEMI.  Catheterization showed severe ostial ramus intermedius disease, which was medically managed.  She otherwise had nonobstructive CAD.  Echo  showed EF 60 to 65%, G1 DD.  Lower extremity ABIs in 2016 normal.  Zio patch from 2018 showed predominantly NSR with average heart rate 76 bpm, first-degree AV block, rare PACs, atrial couplets, PVCs, and ventricular triplets.  No significant arrhythmias were noted.    Seen 10/2016 with dyspnea on exertion and was found to have new anterior T wave changes.  Subsequent 2018 nuclear stress test was without evidence of ischemia.    She later described chest pressure during her virtual visit in 2020.  This was attributed to stress and anxiety.  BP was elevated at the time and she was advised to take short acting diltiazem as needed for SBP  over 150 mmHg.  No further cardiac work-up was recommended.  She reportedly developed gait instability and memory disturbance late 2020 and was evaluated by neurology 01/2019.  MRI of the brain 02/2019 was unrevealing.  Carotid Dopplers normal 08/12/2019.   When seen last in clinic 12/2019, she noted increasing episodes of dizziness and double vision.  She saw her PCP 11/2019, who attributed her dizziness to vertigo and possible polypharmacy.  She was referred to ophthalmology and neurology.  It was noted at her last visit that she had almost weekly episodes where she felt as if the room was spinning.  She had fallen twice due to blurred vision.  Episodes lasted for approximately 30 minutes and resolved with meclizine.  Episodes were noted to be similar to those occurring in 2014.  She had elevated blood pressure at home x2 days with readings showing SBP 160s and DBP is 100.  She took as needed diltiazem 3 times overnight/over a period of 6 hours.  BP was noted to be elevated during clinic.  Diltiazem was therefore increased to 30 mg daily.  It was noted she was not on ASA secondary to colitis.  Referral was placed to PT for vestibular rehabilitation.  It was noted Repatha should be considered at follow-up.  She reportedly tested positive for COVID-19 02/18/2020.  She reports a  history of being vaccinated with [facility] [place] in June/2021.  On 03/02/2020, she presented to [facility] emergency department for symptoms of bradycardia, dyspnea, and presyncope.  She also noted some chest pain, rated mild in severity.  On review of EMR, she reported heart rate between 35 and 40 bpm over the last 48 hours.  She reported that symptoms started late 2/15 (Tuesday) afternoon and continued throughout the morning of 2/17.  She decided to present [facility] hospital [facility]/16 but subsequently left after realizing the [facility] ED was overcrowded.  She presented again 2/17 and was seen in the ED.  Initial vitals significant for BP well controlled at 123/76 and HR 93 bpm.  Labs showed potassium 2.9, glucose 137, hemoglobin 16.2, hematocrit 47.3.  High-sensitivity troponin negative.  EKG showed NSR with first-degree AV block, IVCD,  no significant ST/T wave abnormality.  Chest x-ray was without active cardiopulmonary disease.  Streaky atelectasis or scar was noted at the left base of her lung.  CT was without evidence of PE.  Cardiomegaly with coronary artery calcification was noted, as well as low lung volumes with mosaic appearance of the lung parenchyma bilaterally which may be secondary to low lung volumes versus small airway disease.  Aortic atherosclerosis is noted.  She received potassium repletion for her hypokalemia.  Given her low lung volumes, inhaler versus short course of steroid was discussed.  She was discharged with prescription for prednisone 40 mg tapering, albuterol, and potassium 10 M EQ daily.  Seen 03/10/2020 and recalling previous emergency department visits as outlined above.  She reported SOB with frequent use of the albuterol inhaler every 6 hours.  We discussed the rescue inhaler should not be used this frequently, especially as it will exacerbate any underlying tachycardia.  She reported chest tightness and cough. BP 140/80 with HR 86 bpm (actually improved from previous clinic visit of 150/96).  Weight  decreased from previous clinic weight at 207 pounds  199lbs.  Today, 04/03/2020, she returns to clinic and notes an episode of lightheadedness.  She also reports an episode of strong heartbeat/palpitations.  She denies any frank chest pain.  She reports that she did had  a an episode of discomfort that lasted less for a minute/10 seconds at the most.  She reports breathing stable.  She has not had to use her inhaler.  She has not had to use her walker.  She reports nausea, usually related to food or time of day.  She has been losing weight, she has not been as interested in food. She reports nausea for years.  Cardiac CT results recommended cardiac catheterization, discussed at length today.  Home Medications    Current Outpatient Medications on File Prior to Visit  Medication Sig Dispense Refill  . acetaminophen (TYLENOL) 650 MG CR tablet Take 1,300 mg by mouth 2 (two) times daily.    Marland Kitchen amantadine (SYMMETREL) 100 MG capsule Take 100 mg by mouth 2 (two) times daily.    . Ascorbic Acid (VITAMIN C PO) Take by mouth daily.    Marland Kitchen atorvastatin (LIPITOR) 80 MG tablet TAKE 1 TABLET(80 MG) BY MOUTH DAILY 90 tablet 1  . B Complex Vitamins (VITAMIN B COMPLEX PO) Take by mouth every other day.    Marland Kitchen BAYER MICROLET LANCETS lancets USE AS DIRECTED TO CHECK BLOOD SUGAR TWICE DAILY 200 each 2  . Blood Glucose Monitoring Suppl (CONTOUR NEXT MONITOR) w/Device KIT 1 each by Does not apply route in the morning and at bedtime. Use Contour Next meter to check blood sugar twice daily. 1 kit 0  . Calcium Carb-Cholecalciferol 600-800 MG-UNIT TABS Take 1 tablet by mouth daily.    . cholecalciferol (VITAMIN D3) 25 MCG (1000 UNIT) tablet Take 1,000 Units by mouth daily.    Marland Kitchen co-enzyme Q-10 50 MG capsule Take 50 mg by mouth daily.    . diazepam (VALIUM) 10 MG tablet Take 2.5-10 mg by mouth as needed for anxiety.    . docusate sodium (COLACE) 100 MG capsule Take by mouth. 3 in the morning and 2 at bedtime    . ezetimibe (ZETIA)  10 MG tablet TAKE 1 TABLET(10 MG) BY MOUTH DAILY 90 tablet 3  . famotidine (PEPCID) 40 MG tablet Take 40 mg by mouth 2 (two) times daily.    Marland Kitchen gabapentin (NEURONTIN) 300 MG capsule TAKE 1 CAPSULE(300 MG) BY MOUTH THREE TIMES DAILY 90 capsule 11  . glucose blood (CONTOUR NEXT TEST) test strip Use as instructed 100 each 12  . insulin detemir (LEVEMIR FLEXTOUCH) 100 UNIT/ML FlexPen Inject 60 Units into the skin every evening.    . Insulin Pen Needle (B-D UF III MINI PEN NEEDLES) 31G X 5 MM MISC USE FOR INJECTIONS TWICE DAILY 100 each 12  . JARDIANCE 25 MG TABS tablet TAKE 1 TABLET(25 MG) BY MOUTH DAILY 30 tablet 2  . ketoconazole (NIZORAL) 2 % cream Apply to affected areas one to two times daily as needed. 60 g 2  . Lactobacillus-Inulin (CULTURELLE DIGESTIVE DAILY PO) Take 1 capsule by mouth daily.    Marland Kitchen lamoTRIgine (LAMICTAL) 200 MG tablet Take 400 mg by mouth daily.    Marland Kitchen latanoprost (XALATAN) 0.005 % ophthalmic solution Place 1 drop into both eyes 2 (two) times a week.     Marland Kitchen LEVOXYL 125 MCG tablet Take 2 tablets (250 mcg total) by mouth daily before breakfast. 60 tablet 3  . linaclotide (LINZESS) 290 MCG CAPS capsule Take 290 mcg by mouth as needed.    . loperamide (IMODIUM A-D) 2 MG tablet Take 2 mg by mouth as needed for diarrhea or loose stools.    . Melatonin 10 MG CAPS Take 1 capsule by mouth at bedtime.    Marland Kitchen  mesalamine (LIALDA) 1.2 g EC tablet TAKE 2 TABLETS BY MOUTH DAILY 60 tablet 2  . Multiple Vitamin (MULTIVITAMIN) tablet Take 1 tablet by mouth daily.    . ondansetron (ZOFRAN-ODT) 4 MG disintegrating tablet DISSOLVE 1 TABLET(4 MG) ON THE TONGUE EVERY 6 HOURS AS NEEDED FOR NAUSEA OR VOMITING 90 tablet 1  . pantoprazole (PROTONIX) 40 MG tablet TAKE 1 TABLET(40 MG) BY MOUTH TWICE DAILY 60 tablet 6  . QUEtiapine (SEROQUEL XR) 400 MG 24 hr tablet Take 800 mg by mouth at bedtime.    . Semaglutide, 1 MG/DOSE, (OZEMPIC, 1 MG/DOSE,) 4 MG/3ML SOPN INJECT 1MG INTO THE SKIN ONCE A WEEK 3 mL 3  .  Simethicone (GAS-X PO) Take 1 tablet by mouth 4 (four) times daily.    . valACYclovir (VALTREX) 1000 MG tablet TAKE 1 TABLET(1000 MG) BY MOUTH DAILY 30 tablet 3  . VRAYLAR capsule Take 3 mg by mouth at bedtime.    Marland Kitchen VYVANSE 50 MG capsule Take 50 mg by mouth every morning.    . zaleplon (SONATA) 10 MG capsule Take 20 mg by mouth at bedtime.    . clopidogrel (PLAVIX) 75 MG tablet TAKE 1 TABLET(75 MG) BY MOUTH DAILY 90 tablet 0   No current facility-administered medications on file prior to visit.    Review of Systems    She denies palpitations, pnd, orthopnea, n, v, syncope, edema, weight gain, or early satiety.  She reports an episode of bradycardic heart rates.  She reports chest tightness and dyspnea/shortness of breath. She has dizziness. She reports a previous cough that improved with her albuterol and prednisone.  She reports chest tightness also improves with albuterol and prednisone.  All other systems reviewed and are otherwise negative except as noted above.  Physical Exam    VS:  BP 98/62   Pulse (!) 46   Ht 5' 5.5" (1.664 m)   Wt 189 lb (85.7 kg)   LMP 08/23/2006 (Approximate) Comment: tubal ligation  BMI 30.97 kg/m  , BMI Body mass index is 30.97 kg/m. GEN: Well nourished, well developed, in no acute distress. HEENT: normal. Neck: Supple, no JVD, carotid bruits, or masses. Cardiac: RRR with extrasystole, no murmurs, rubs, or gallops. No clubbing, cyanosis, edema.  Radials/DP/PT 2+ and equal bilaterally.  Respiratory:  Respirations regular and unlabored, clear to auscultation bilaterally. GI: Soft, nontender, nondistended, BS + x 4. MS: no deformity or atrophy. Skin: warm and dry, no rash. Neuro:  Strength and sensation are intact. Psych: Normal affect.  Accessory Clinical Findings    ECG personally reviewed by me today - sinus rhythm with frequent PVCs/bigeminy, 85bpm, left axis deviation, IVCD with QRS 69m, PRi 188 ms, T wave inversion new in lateral leads I, V4 ,  QTC 449,- no acute changes.  VITALS Reviewed today   Temp Readings from Last 3 Encounters:  03/27/20 (!) 96.9 F (36.1 C) (Temporal)  03/07/20 (!) 96.9 F (36.1 C) (Temporal)  03/02/20 98.1 F (36.7 C) (Oral)   BP Readings from Last 3 Encounters:  04/03/20 98/62  03/30/20 112/78  03/27/20 124/80   Pulse Readings from Last 3 Encounters:  04/03/20 (!) 46  03/30/20 68  03/27/20 95    Wt Readings from Last 3 Encounters:  04/03/20 189 lb (85.7 kg)  03/30/20 193 lb (87.5 kg)  03/27/20 193 lb 3 oz (87.6 kg)     LABS  reviewed today   Lab Results  Component Value Date   WBC 8.0 03/24/2020   HGB 14.8 03/24/2020  HCT 43.6 03/24/2020   MCV 91.5 03/24/2020   PLT 302.0 03/24/2020   Lab Results  Component Value Date   CREATININE 0.83 03/24/2020   BUN 15 03/24/2020   NA 143 03/24/2020   K 4.4 03/24/2020   CL 109 03/24/2020   CO2 27 03/24/2020   Lab Results  Component Value Date   ALT 70 (H) 03/24/2020   AST 60 (H) 03/24/2020   ALKPHOS 123 (H) 03/24/2020   BILITOT 0.5 03/24/2020   Lab Results  Component Value Date   CHOL 120 03/07/2020   HDL 40.30 03/07/2020   LDLCALC 46 03/07/2020   LDLDIRECT 125.0 02/07/2020   TRIG 172.0 (H) 03/07/2020   CHOLHDL 3 03/07/2020    Lab Results  Component Value Date   HGBA1C 6.7 (H) 03/07/2020   Lab Results  Component Value Date   TSH 2.87 03/07/2020     STUDIES/PROCEDURES reviewed today   US Carotid 07/2019 Summary:  Right Carotid: Velocities in the right ICA are consistent with a 1-39%  stenosis.  Vertebrals: Bilateral vertebral arteries demonstrate antegrade flow.  Subclavians: Normal flow hemodynamics were seen in bilateral subclavian        arteries.    2018 MPI Pharmacological myocardial perfusion imaging study with no significant  ischemia Normal wall motion, EF estimated at 74% No EKG changes concerning for ischemia at peak stress or in recovery. Low risk scan  2018 Ambulatory Cardiac  monitoring Event monitor Normal sinus rhythm Min HR of 56 bpm, max HR of 133 bpm, and avg HR of 76 bpm.  First Degree AV Block was present. Isolated SVEs were rare (<1.0%), SVE Couplets were rare (<1.0%), and no SVE Triplets were present.  Isolated VEs were rare (<1.0%, 766), VE Triplets were rare (<1.0%, 1), and no VE Couplets were present.   2016 Echo - Left ventricle: The cavity size was normal. Systolic function was  normal. The estimated ejection fraction was in the range of 60%  to 65%. Wall motion was normal; there were no regional wall  motion abnormalities. Doppler parameters are consistent with  abnormal left ventricular relaxation (grade 1 diastolic  dysfunction).  - Left atrium: The atrium was normal in size.  - Right ventricle: Systolic function was normal.  - Pulmonary arteries: Systolic pressure was within the normal  range.   2016 ABIs Normal ABIs  2016 LHC  Acute Mrg lesion, 60% stenosed.  Ost Ramus to Ramus lesion, 99% stenosed.  Prox LAD lesion, 50% stenosed.  1st Mrg lesion, 40% stenosed. 1. Severe one-vessel coronary artery disease. The culprit for myocardial infarction this subtotal occlusion of the ostial ramus. This vessel is medium size and the diameter is about 2 mm. 2. Normal LV systolic function and mildly elevated left ventricular end-diastolic pressure. Recommendations: The ostial ramus stenosis is in a location where placing a stent might compromise the flow to the LAD or left circumflex. Doing balloon angioplasty alone would probably not have good long-term results given that the diameter is about 2 mm. Thus, I think the best option is medical therapy for now. I increased the dose of atorvastatin and placed the patient on Brilinta to be taken for one year. Recommend aggressive control of risk factors and blood pressure control.   Assessment & Plan    Chest tightness  CAD s/p NSTEMI 08/2019 --No current chest pain. EKG at previous  visit with new lateral TWI and ectopy. Suspicion was ectopy as the likely reason for her bradycardic and hypotensive readings at home, given  this will throw off BP / HR cuff rate measurements.  2016 cath with severe ostial ramus intermedius disease, managed medically.  2018 MPI without evidence of ischemia.  Given her chest tightness and dyspnea reported at previous visit, as well as her frequent ectopy and new lateral T wave inversion on EKG, cardiac CT recommended with results indicating catheterization recommedned  Continue current medical therapy.  No ASA secondary to colitis.  She is on Plavix 75 mg daily.  Continue long-acting Cardizem 300 mg daily with as needed short acting Cardizem for SBP over 150.  Aggressive risk factor modification.  Frequent ectopy on EKG Pt Reported bradycardia --As above with repeat EKG today. Pending cardiac cath.   Essential HTN, goal BP less than 130/80 --Continue current medications..   Chronic HFpEF/G1DD --Euvolemic and well compensated on exam. Continue current medications.  Hyperlipidemia, goal LDL less than 70 --Continue current PCSK9i versus statin and Zetia as all 3 are listed on her medications list. She reports she is no longer taking Repatha due to insurance issues.  2/22 labs show LDL 46 and total cholesterol 120.  Dizziness/vertigo --History of dizziness/vertigo/double vision. Will still reduce Benicar as discussed.  Further recommendations pending cath.  Carotid artery disease, mild --Denies any signs or symptoms of worsening carotid artery disease.  No reported amaurosis fugax.  She does report dizziness.  At RTC, could consider repeat carotid studies.  Will defer for now and preference to first workup ischemia, which is reasonable.  DM2 --03/07/2020 A1c 6.7.  Ongoing glycemic control recommended for risk factor modification.  Managed per endocrinology.  Medication changes: Reduced Benicar 35m daily Disposition: RTC after cath   JArvil Chaco PA-C 04/03/2020

## 2020-04-04 ENCOUNTER — Other Ambulatory Visit
Admission: RE | Admit: 2020-04-04 | Discharge: 2020-04-04 | Disposition: A | Discharge status: Home / Self Care | Payer: BC Managed Care – PPO | Source: Ambulatory Visit | Attending: Cardiovascular Disease | Admitting: Cardiovascular Disease

## 2020-04-04 ENCOUNTER — Other Ambulatory Visit: Payer: Self-pay

## 2020-04-04 ENCOUNTER — Ambulatory Visit (INDEPENDENT_AMBULATORY_CARE_PROVIDER_SITE_OTHER): Payer: BC Managed Care – PPO | Admitting: Psychology

## 2020-04-04 DIAGNOSIS — Z01812 Encounter for preprocedural laboratory examination: Secondary | ICD-10-CM | POA: Insufficient documentation

## 2020-04-04 DIAGNOSIS — Z20822 Contact with and (suspected) exposure to covid-19: Secondary | ICD-10-CM | POA: Insufficient documentation

## 2020-04-04 DIAGNOSIS — F3131 Bipolar disorder, current episode depressed, mild: Secondary | ICD-10-CM

## 2020-04-04 LAB — SARS CORONAVIRUS 2 (TAT 6-24 HRS): SARS Coronavirus 2: NEGATIVE

## 2020-04-05 ENCOUNTER — Other Ambulatory Visit: Payer: Self-pay | Admitting: Cardiovascular Disease

## 2020-04-05 DIAGNOSIS — I200 Unstable angina: Secondary | ICD-10-CM

## 2020-04-06 ENCOUNTER — Encounter
Admission: RE | Disposition: A | Discharge status: Home / Self Care | Payer: Self-pay | Source: Home / Self Care | Attending: Cardiovascular Disease

## 2020-04-06 ENCOUNTER — Encounter: Payer: Self-pay | Admitting: Cardiovascular Disease

## 2020-04-06 ENCOUNTER — Observation Stay
Admission: RE | Admit: 2020-04-06 | Discharge: 2020-04-07 | Disposition: A | Discharge status: Home / Self Care | Payer: BC Managed Care – PPO | Attending: Cardiovascular Disease | Admitting: Cardiovascular Disease

## 2020-04-06 ENCOUNTER — Other Ambulatory Visit: Payer: Self-pay

## 2020-04-06 ENCOUNTER — Ambulatory Visit: Payer: BC Managed Care – PPO | Admitting: Psychology

## 2020-04-06 DIAGNOSIS — I25110 Atherosclerotic heart disease of native coronary artery with unstable angina pectoris: Principal | ICD-10-CM | POA: Insufficient documentation

## 2020-04-06 DIAGNOSIS — I25119 Atherosclerotic heart disease of native coronary artery with unspecified angina pectoris: Secondary | ICD-10-CM | POA: Diagnosis not present

## 2020-04-06 DIAGNOSIS — R0602 Shortness of breath: Secondary | ICD-10-CM | POA: Diagnosis not present

## 2020-04-06 DIAGNOSIS — I200 Unstable angina: Secondary | ICD-10-CM | POA: Diagnosis not present

## 2020-04-06 DIAGNOSIS — E669 Obesity, unspecified: Secondary | ICD-10-CM | POA: Insufficient documentation

## 2020-04-06 DIAGNOSIS — E1169 Type 2 diabetes mellitus with other specified complication: Secondary | ICD-10-CM | POA: Diagnosis present

## 2020-04-06 DIAGNOSIS — I10 Essential (primary) hypertension: Secondary | ICD-10-CM | POA: Diagnosis not present

## 2020-04-06 DIAGNOSIS — R931 Abnormal findings on diagnostic imaging of heart and coronary circulation: Secondary | ICD-10-CM | POA: Insufficient documentation

## 2020-04-06 DIAGNOSIS — E119 Type 2 diabetes mellitus without complications: Secondary | ICD-10-CM | POA: Insufficient documentation

## 2020-04-06 DIAGNOSIS — Z6831 Body mass index (BMI) 31.0-31.9, adult: Secondary | ICD-10-CM | POA: Insufficient documentation

## 2020-04-06 DIAGNOSIS — I2510 Atherosclerotic heart disease of native coronary artery without angina pectoris: Secondary | ICD-10-CM | POA: Diagnosis present

## 2020-04-06 DIAGNOSIS — E785 Hyperlipidemia, unspecified: Secondary | ICD-10-CM | POA: Diagnosis not present

## 2020-04-06 DIAGNOSIS — E1159 Type 2 diabetes mellitus with other circulatory complications: Secondary | ICD-10-CM | POA: Diagnosis not present

## 2020-04-06 HISTORY — PX: CORONARY STENT INTERVENTION: CATH118234

## 2020-04-06 HISTORY — PX: LEFT HEART CATH AND CORONARY ANGIOGRAPHY: CATH118249

## 2020-04-06 LAB — POCT ACTIVATED CLOTTING TIME: Activated Clotting Time: 499 seconds

## 2020-04-06 LAB — GLUCOSE, CAPILLARY
Glucose-Capillary: 143 mg/dL — ABNORMAL HIGH (ref 70–99)
Glucose-Capillary: 128 mg/dL — ABNORMAL HIGH (ref 70–99)
Glucose-Capillary: 119 mg/dL — ABNORMAL HIGH (ref 70–99)
Glucose-Capillary: 96 mg/dL (ref 70–99)

## 2020-04-06 SURGERY — LEFT HEART CATH AND CORONARY ANGIOGRAPHY
Anesthesia: Moderate Sedation

## 2020-04-06 MED ORDER — NITROGLYCERIN 1 MG/10 ML FOR IR/CATH LAB
INTRA_ARTERIAL | Status: DC | PRN
  Administered 2020-04-06 (×2): 100 ug via INTRACORONARY

## 2020-04-06 MED ORDER — SODIUM CHLORIDE 0.9 % IV SOLN
INTRAVENOUS | Status: AC | PRN
  Administered 2020-04-06: 250 mL via INTRAVENOUS

## 2020-04-06 MED ORDER — VALACYCLOVIR HCL 500 MG PO TABS
1000.0000 mg | ORAL_TABLET | Freq: Every day | ORAL | Status: DC
  Administered 2020-04-06: 1000 mg via ORAL
  Filled 2020-04-06 (×2): qty 2

## 2020-04-06 MED ORDER — HYDRALAZINE HCL 20 MG/ML IJ SOLN: 10.0000 mg | INTRAMUSCULAR | Status: AC | PRN

## 2020-04-06 MED ORDER — CLOPIDOGREL BISULFATE 75 MG PO TABS
ORAL_TABLET | ORAL | Status: AC
  Filled 2020-04-06: qty 4

## 2020-04-06 MED ORDER — SIMETHICONE 80 MG PO CHEW
80.0000 mg | CHEWABLE_TABLET | Freq: Four times a day (QID) | ORAL | Status: DC
  Administered 2020-04-06 – 2020-04-07 (×3): 80 mg via ORAL
  Filled 2020-04-06 (×6): qty 1

## 2020-04-06 MED ORDER — MIDAZOLAM HCL 2 MG/2ML IJ SOLN
INTRAMUSCULAR | Status: DC | PRN
  Administered 2020-04-06: 0.5 mg via INTRAVENOUS

## 2020-04-06 MED ORDER — LINACLOTIDE 290 MCG PO CAPS
290.0000 ug | ORAL_CAPSULE | Freq: Every day | ORAL | Status: DC | PRN
  Filled 2020-04-06: qty 1

## 2020-04-06 MED ORDER — LOPERAMIDE HCL 2 MG PO CAPS: 2.0000 mg | ORAL_CAPSULE | Freq: Every day | ORAL | Status: DC | PRN

## 2020-04-06 MED ORDER — MELATONIN 5 MG PO TABS
10.0000 mg | ORAL_TABLET | Freq: Every day | ORAL | Status: DC
  Administered 2020-04-06: 10 mg via ORAL
  Filled 2020-04-06: qty 2

## 2020-04-06 MED ORDER — LISDEXAMFETAMINE DIMESYLATE 50 MG PO CAPS: 50.0000 mg | ORAL_CAPSULE | Freq: Every morning | ORAL | Status: DC

## 2020-04-06 MED ORDER — CLOPIDOGREL BISULFATE 75 MG PO TABS
ORAL_TABLET | ORAL | Status: DC | PRN
  Administered 2020-04-06: 300 mg via ORAL

## 2020-04-06 MED ORDER — EZETIMIBE 10 MG PO TABS
10.0000 mg | ORAL_TABLET | Freq: Every day | ORAL | Status: DC
  Administered 2020-04-06: 10 mg via ORAL
  Filled 2020-04-06: qty 1

## 2020-04-06 MED ORDER — AMANTADINE HCL 100 MG PO CAPS
100.0000 mg | ORAL_CAPSULE | Freq: Two times a day (BID) | ORAL | Status: DC
  Administered 2020-04-06: 100 mg via ORAL
  Filled 2020-04-06 (×3): qty 1

## 2020-04-06 MED ORDER — FENTANYL CITRATE (PF) 100 MCG/2ML IJ SOLN
INTRAMUSCULAR | Status: DC | PRN
  Administered 2020-04-06: 12.5 ug via INTRAVENOUS

## 2020-04-06 MED ORDER — LEVOTHYROXINE SODIUM 125 MCG PO TABS
250.0000 ug | ORAL_TABLET | Freq: Every day | ORAL | Status: DC
  Administered 2020-04-07: 250 ug via ORAL
  Filled 2020-04-06: qty 2

## 2020-04-06 MED ORDER — INSULIN DETEMIR 100 UNIT/ML FLEXPEN: 60.0000 [IU] | PEN_INJECTOR | Freq: Every day | SUBCUTANEOUS | Status: DC

## 2020-04-06 MED ORDER — IOHEXOL 300 MG/ML  SOLN
INTRAMUSCULAR | Status: DC | PRN
  Administered 2020-04-06: 50 mL

## 2020-04-06 MED ORDER — DIPHENHYDRAMINE HCL 25 MG PO TABS
25.0000 mg | ORAL_TABLET | Freq: Every day | ORAL | Status: DC
  Administered 2020-04-06: 25 mg via ORAL
  Filled 2020-04-06 (×3): qty 1

## 2020-04-06 MED ORDER — INSULIN ASPART 100 UNIT/ML ~~LOC~~ SOLN
0.0000 [IU] | Freq: Three times a day (TID) | SUBCUTANEOUS | Status: DC
  Administered 2020-04-06: 2 [IU] via SUBCUTANEOUS
  Administered 2020-04-07: 3 [IU] via SUBCUTANEOUS
  Filled 2020-04-06 (×2): qty 1

## 2020-04-06 MED ORDER — SODIUM CHLORIDE 0.9% FLUSH
3.0000 mL | Freq: Two times a day (BID) | INTRAVENOUS | Status: DC
  Administered 2020-04-06: 3 mL via INTRAVENOUS

## 2020-04-06 MED ORDER — HEPARIN (PORCINE) IN NACL 2000-0.9 UNIT/L-% IV SOLN
INTRAVENOUS | Status: DC | PRN
  Administered 2020-04-06: 1000 mL

## 2020-04-06 MED ORDER — MIDAZOLAM HCL 2 MG/2ML IJ SOLN
INTRAMUSCULAR | Status: AC
  Filled 2020-04-06: qty 2

## 2020-04-06 MED ORDER — CO-ENZYME Q-10 100 MG PO CAPS: 100.0000 mg | ORAL_CAPSULE | Freq: Every day | ORAL | Status: DC

## 2020-04-06 MED ORDER — FENTANYL CITRATE (PF) 100 MCG/2ML IJ SOLN
INTRAMUSCULAR | Status: AC
  Filled 2020-04-06: qty 2

## 2020-04-06 MED ORDER — SODIUM CHLORIDE 0.9 % IV SOLN: 250.0000 mL | INTRAVENOUS | Status: DC | PRN

## 2020-04-06 MED ORDER — ATORVASTATIN CALCIUM 80 MG PO TABS
80.0000 mg | ORAL_TABLET | Freq: Every evening | ORAL | Status: DC
  Administered 2020-04-06: 80 mg via ORAL
  Filled 2020-04-06: qty 1

## 2020-04-06 MED ORDER — ASPIRIN 81 MG PO CHEW: 81.0000 mg | CHEWABLE_TABLET | ORAL | Status: AC

## 2020-04-06 MED ORDER — QUETIAPINE FUMARATE ER 200 MG PO TB24
800.0000 mg | ORAL_TABLET | Freq: Every day | ORAL | Status: DC
  Administered 2020-04-06: 800 mg via ORAL
  Filled 2020-04-06: qty 4
  Filled 2020-04-06: qty 2
  Filled 2020-04-06: qty 4

## 2020-04-06 MED ORDER — PANTOPRAZOLE SODIUM 40 MG PO TBEC
40.0000 mg | DELAYED_RELEASE_TABLET | Freq: Two times a day (BID) | ORAL | Status: DC
  Administered 2020-04-06: 40 mg via ORAL
  Filled 2020-04-06: qty 1

## 2020-04-06 MED ORDER — SODIUM CHLORIDE 0.9 % IV SOLN: INTRAVENOUS | Status: AC

## 2020-04-06 MED ORDER — SODIUM CHLORIDE 0.9 % IV SOLN
INTRAVENOUS | Status: DC | PRN
  Administered 2020-04-06: 1.75 mg/kg/h via INTRAVENOUS

## 2020-04-06 MED ORDER — POLYETHYLENE GLYCOL 3350 17 G PO PACK: 17.0000 g | PACK | Freq: Three times a day (TID) | ORAL | Status: DC | PRN

## 2020-04-06 MED ORDER — LABETALOL HCL 5 MG/ML IV SOLN: 10.0000 mg | INTRAVENOUS | Status: AC | PRN

## 2020-04-06 MED ORDER — SODIUM CHLORIDE 0.9 % WEIGHT BASED INFUSION: 85.7000 mL/h | INTRAVENOUS | Status: DC

## 2020-04-06 MED ORDER — ACETAMINOPHEN 325 MG PO TABS: 650.0000 mg | ORAL_TABLET | ORAL | Status: DC | PRN

## 2020-04-06 MED ORDER — FAMOTIDINE 20 MG PO TABS
40.0000 mg | ORAL_TABLET | Freq: Two times a day (BID) | ORAL | Status: DC
  Administered 2020-04-06 – 2020-04-07 (×2): 40 mg via ORAL
  Filled 2020-04-06 (×2): qty 2

## 2020-04-06 MED ORDER — ACETAMINOPHEN 500 MG PO TABS
1000.0000 mg | ORAL_TABLET | Freq: Three times a day (TID) | ORAL | Status: DC
  Administered 2020-04-06 – 2020-04-07 (×3): 1000 mg via ORAL
  Filled 2020-04-06 (×3): qty 2

## 2020-04-06 MED ORDER — SODIUM CHLORIDE 0.9 % WEIGHT BASED INFUSION
257.1000 mL/h | INTRAVENOUS | Status: AC
  Administered 2020-04-06: 257.1 mL/h via INTRAVENOUS

## 2020-04-06 MED ORDER — DIAZEPAM 5 MG PO TABS
5.0000 mg | ORAL_TABLET | Freq: Two times a day (BID) | ORAL | Status: DC | PRN
  Administered 2020-04-06: 10 mg via ORAL
  Filled 2020-04-06: qty 2

## 2020-04-06 MED ORDER — ASPIRIN 81 MG PO CHEW: 81.0000 mg | CHEWABLE_TABLET | Freq: Every day | ORAL | Status: DC

## 2020-04-06 MED ORDER — LAMOTRIGINE 100 MG PO TABS: 400.0000 mg | ORAL_TABLET | Freq: Every day | ORAL | Status: DC

## 2020-04-06 MED ORDER — BIVALIRUDIN TRIFLUOROACETATE 250 MG IV SOLR
INTRAVENOUS | Status: AC
  Filled 2020-04-06: qty 250

## 2020-04-06 MED ORDER — ACETAMINOPHEN ER 650 MG PO TBCR: 1300.0000 mg | EXTENDED_RELEASE_TABLET | Freq: Two times a day (BID) | ORAL | Status: DC

## 2020-04-06 MED ORDER — ONDANSETRON HCL 4 MG/2ML IJ SOLN
4.0000 mg | Freq: Four times a day (QID) | INTRAMUSCULAR | Status: DC | PRN
  Administered 2020-04-06: 4 mg via INTRAVENOUS
  Filled 2020-04-06: qty 2

## 2020-04-06 MED ORDER — BIVALIRUDIN BOLUS VIA INFUSION - CUPID
INTRAVENOUS | Status: DC | PRN
  Administered 2020-04-06: 63.6 mg via INTRAVENOUS

## 2020-04-06 MED ORDER — ASPIRIN 81 MG PO CHEW
CHEWABLE_TABLET | ORAL | Status: AC
  Administered 2020-04-06: 81 mg via ORAL
  Filled 2020-04-06: qty 1

## 2020-04-06 MED ORDER — HEPARIN (PORCINE) IN NACL 1000-0.9 UT/500ML-% IV SOLN
INTRAVENOUS | Status: AC
  Filled 2020-04-06: qty 1000

## 2020-04-06 MED ORDER — SODIUM CHLORIDE 0.9% FLUSH: 3.0000 mL | INTRAVENOUS | Status: DC | PRN

## 2020-04-06 MED ORDER — NITROGLYCERIN 0.4 MG SL SUBL: 0.4000 mg | SUBLINGUAL_TABLET | SUBLINGUAL | Status: DC | PRN

## 2020-04-06 MED ORDER — CARIPRAZINE HCL 3 MG PO CAPS
3.0000 mg | ORAL_CAPSULE | Freq: Every day | ORAL | Status: DC
  Administered 2020-04-06: 3 mg via ORAL
  Filled 2020-04-06 (×2): qty 1

## 2020-04-06 MED ORDER — GABAPENTIN 300 MG PO CAPS
300.0000 mg | ORAL_CAPSULE | Freq: Three times a day (TID) | ORAL | Status: DC
  Administered 2020-04-06 – 2020-04-07 (×3): 300 mg via ORAL
  Filled 2020-04-06 (×3): qty 1

## 2020-04-06 MED ORDER — ENOXAPARIN SODIUM 40 MG/0.4ML ~~LOC~~ SOLN
40.0000 mg | SUBCUTANEOUS | Status: DC
  Administered 2020-04-07: 40 mg via SUBCUTANEOUS
  Filled 2020-04-06 (×2): qty 0.4

## 2020-04-06 MED ORDER — IOHEXOL 300 MG/ML  SOLN
INTRAMUSCULAR | Status: DC | PRN
  Administered 2020-04-06: 69 mL via INTRA_ARTERIAL

## 2020-04-06 MED ORDER — MESALAMINE 1.2 G PO TBEC
2.4000 g | DELAYED_RELEASE_TABLET | Freq: Every day | ORAL | Status: DC
  Filled 2020-04-06: qty 2

## 2020-04-06 MED ORDER — CLOPIDOGREL BISULFATE 75 MG PO TABS: 75.0000 mg | ORAL_TABLET | Freq: Every day | ORAL | Status: DC

## 2020-04-06 MED ORDER — INSULIN DETEMIR 100 UNIT/ML ~~LOC~~ SOLN
60.0000 [IU] | Freq: Every day | SUBCUTANEOUS | Status: DC
  Filled 2020-04-06 (×2): qty 0.6

## 2020-04-06 SURGICAL SUPPLY — 18 items
BALLN TREK RX 2.5X8 (BALLOONS) ×2
BALLN ~~LOC~~ TREK RX 2.75X8 (BALLOONS) ×2
BALLOON TREK RX 2.5X8 (BALLOONS) ×1 IMPLANT
BALLOON ~~LOC~~ TREK RX 2.75X8 (BALLOONS) ×1 IMPLANT
CATH INFINITI 5FR JL4 (CATHETERS) ×2 IMPLANT
CATH INFINITI JR4 5F (CATHETERS) ×2 IMPLANT
CATH LAUNCHER 6FR EBU 3.75 (CATHETERS) ×2 IMPLANT
DEVICE CLOSURE MYNXGRIP 6/7F (Vascular Products) ×2 IMPLANT
KIT ENCORE 26 ADVANTAGE (KITS) ×2 IMPLANT
KIT SYRINGE INJ CVI SPIKEX1 (MISCELLANEOUS) ×2 IMPLANT
NEEDLE PERC 18GX7CM (NEEDLE) ×2 IMPLANT
PACK CARDIAC CATH (CUSTOM PROCEDURE TRAY) ×2 IMPLANT
SET ATX SIMPLICITY (MISCELLANEOUS) ×2 IMPLANT
SHEATH AVANTI 5FR X 11CM (SHEATH) ×2 IMPLANT
SHEATH AVANTI 6FR X 11CM (SHEATH) ×2 IMPLANT
STENT RESOLUTE ONYX 2.75X12 (Permanent Stent) ×2 IMPLANT
WIRE GUIDERIGHT .035X150 (WIRE) ×2 IMPLANT
WIRE RUNTHROUGH .014X180CM (WIRE) ×2 IMPLANT

## 2020-04-06 NOTE — Plan of Care (Signed)

## 2020-04-07 DIAGNOSIS — E1169 Type 2 diabetes mellitus with other specified complication: Secondary | ICD-10-CM

## 2020-04-07 DIAGNOSIS — Z9861 Coronary angioplasty status: Secondary | ICD-10-CM | POA: Diagnosis not present

## 2020-04-07 DIAGNOSIS — I200 Unstable angina: Secondary | ICD-10-CM | POA: Diagnosis not present

## 2020-04-07 DIAGNOSIS — R931 Abnormal findings on diagnostic imaging of heart and coronary circulation: Secondary | ICD-10-CM | POA: Diagnosis not present

## 2020-04-07 DIAGNOSIS — Z6831 Body mass index (BMI) 31.0-31.9, adult: Secondary | ICD-10-CM | POA: Diagnosis not present

## 2020-04-07 DIAGNOSIS — E119 Type 2 diabetes mellitus without complications: Secondary | ICD-10-CM | POA: Diagnosis not present

## 2020-04-07 DIAGNOSIS — I25119 Atherosclerotic heart disease of native coronary artery with unspecified angina pectoris: Secondary | ICD-10-CM | POA: Diagnosis not present

## 2020-04-07 DIAGNOSIS — I10 Essential (primary) hypertension: Secondary | ICD-10-CM | POA: Diagnosis not present

## 2020-04-07 DIAGNOSIS — E1159 Type 2 diabetes mellitus with other circulatory complications: Secondary | ICD-10-CM | POA: Diagnosis not present

## 2020-04-07 DIAGNOSIS — I25110 Atherosclerotic heart disease of native coronary artery with unstable angina pectoris: Secondary | ICD-10-CM | POA: Diagnosis not present

## 2020-04-07 DIAGNOSIS — E669 Obesity, unspecified: Secondary | ICD-10-CM | POA: Diagnosis not present

## 2020-04-07 DIAGNOSIS — E785 Hyperlipidemia, unspecified: Secondary | ICD-10-CM

## 2020-04-07 DIAGNOSIS — R0602 Shortness of breath: Secondary | ICD-10-CM | POA: Diagnosis not present

## 2020-04-07 LAB — BASIC METABOLIC PANEL
Anion gap: 6 (ref 5–15)
BUN: 16 mg/dL (ref 8–23)
CO2: 25 mmol/L (ref 22–32)
Calcium: 8.9 mg/dL (ref 8.9–10.3)
Chloride: 110 mmol/L (ref 98–111)
Creatinine, Ser: 0.63 mg/dL (ref 0.44–1.00)
GFR, Estimated: >60 mL/min (ref >60)
Glucose, Bld: 113 mg/dL — ABNORMAL HIGH (ref 70–99)
Potassium: 4 mmol/L (ref 3.5–5.1)
Sodium: 141 mmol/L (ref 135–145)

## 2020-04-07 LAB — CBC
HCT: 44.8 % (ref 36.0–46.0)
Hemoglobin: 14.8 g/dL (ref 12.0–15.0)
MCH: 30.6 pg (ref 26.0–34.0)
MCHC: 33 g/dL (ref 30.0–36.0)
MCV: 92.8 fL (ref 80.0–100.0)
Platelets: 314 10*3/uL (ref 150–400)
RBC: 4.83 MIL/uL (ref 3.87–5.11)
RDW: 13.8 % (ref 11.5–15.5)
WBC: 6.7 10*3/uL (ref 4.0–10.5)
nRBC: 0 % (ref 0.0–0.2)

## 2020-04-07 LAB — GLUCOSE, CAPILLARY: Glucose-Capillary: 183 mg/dL — ABNORMAL HIGH (ref 70–99)

## 2020-04-07 MED ORDER — ASPIRIN 81 MG PO CHEW
81.0000 mg | CHEWABLE_TABLET | Freq: Every day | ORAL | 3 refills | Status: DC
Start: 2020-04-07 — End: 2021-06-04

## 2020-04-07 NOTE — Progress Notes (Signed)
[name] to be D/C'd Home per MD order.  Discussed prescriptions and follow up appointments with the patient. Prescriptions electronically submitted, medication list explained in detail. Pt verbalized understanding. Vascular discharge instructions given to patient  Allergies as of 04/07/2020      Reactions   Ephedrine Other (See Comments)   Pt becomes hyper   Aspirin    REACTION: aggrivates colitis   Crestor [rosuvastatin]    REACTION: increased lfts   Erythromycin    REACTION: GI upset   Nsaids    REACTION: aggrivate colitis   Oxycodone Other (See Comments)   Panic Attack      Medication List    TAKE these medications   acetaminophen 650 MG CR tablet Commonly known as: TYLENOL Take 1,300 mg by mouth 2 (two) times daily.   amantadine 100 MG capsule Commonly known as: SYMMETREL Take 100 mg by mouth 2 (two) times daily.   aspirin 81 MG chewable tablet Chew 1 tablet (81 mg total) by mouth daily.   atorvastatin 80 MG tablet Commonly known as: LIPITOR TAKE 1 TABLET(80 MG) BY MOUTH DAILY What changed: See the new instructions.   B-D UF III MINI PEN NEEDLES 31G X 5 MM Misc Generic drug: Insulin Pen Needle USE FOR INJECTIONS TWICE DAILY   Bayer Microlet Lancets lancets USE AS DIRECTED TO CHECK BLOOD SUGAR TWICE DAILY   CALTRATE 600+D3 PO Take 1 tablet by mouth daily. 300 mg Vitamin D3   cholecalciferol 25 MCG (1000 UNIT) tablet Commonly known as: VITAMIN D3 Take 1,000 Units by mouth daily.   clopidogrel 75 MG tablet Commonly known as: PLAVIX TAKE 1 TABLET(75 MG) BY MOUTH DAILY What changed: See the new instructions.   Co-Enzyme Q-10 100 MG Caps Take 100 mg by mouth daily.   Contour Next Monitor w/Device Kit 1 each by Does not apply route in the morning and at bedtime. Use Contour Next meter to check blood sugar twice daily.   Contour Next Test test strip Generic drug: glucose blood Use as instructed   CULTURELLE PROBIOTICS PO Take 1 capsule by mouth  daily. For woman   diazepam 10 MG tablet Commonly known as: VALIUM Take 5-10 mg by mouth 2 (two) times daily as needed for anxiety.   diphenhydrAMINE 25 MG tablet Commonly known as: BENADRYL Take 25-50 mg by mouth at bedtime.   docusate sodium 100 MG capsule Commonly known as: COLACE Take 100-300 mg by mouth daily as needed for mild constipation or moderate constipation.   ezetimibe 10 MG tablet Commonly known as: ZETIA TAKE 1 TABLET(10 MG) BY MOUTH DAILY What changed: See the new instructions.   famotidine 40 MG tablet Commonly known as: PEPCID Take 40 mg by mouth 2 (two) times daily. 30 min. Before meal and at bedtime   gabapentin 300 MG capsule Commonly known as: NEURONTIN TAKE 1 CAPSULE(300 MG) BY MOUTH THREE TIMES DAILY What changed:   how much to take  how to take this  when to take this  additional instructions   Jardiance 25 MG Tabs tablet Generic drug: empagliflozin TAKE 1 TABLET(25 MG) BY MOUTH DAILY What changed: See the new instructions.   ketoconazole 2 % cream Commonly known as: NIZORAL Apply to affected areas one to two times daily as needed. What changed:   how much to take  how to take this  when to take this  reasons to take this  additional instructions   lamoTRIgine 200 MG tablet Commonly known as: LAMICTAL Take 400 mg  by mouth daily.   latanoprost 0.005 % ophthalmic solution Commonly known as: XALATAN Place 1 drop into both eyes 2 (two) times a week.   Levemir FlexTouch 100 UNIT/ML FlexPen Generic drug: insulin detemir Inject 60 Units into the skin every evening.   Levoxyl 125 MCG tablet Generic drug: levothyroxine Take 2 tablets (250 mcg total) by mouth daily before breakfast.   linaclotide 290 MCG Caps capsule Commonly known as: LINZESS Take 290 mcg by mouth daily as needed (Constipation).   loperamide 2 MG tablet Commonly known as: IMODIUM A-D Take 2 mg by mouth daily as needed for diarrhea or loose stools.    Melatonin 10 MG Caps Take 10 mg by mouth at bedtime.   mesalamine 1.2 g EC tablet Commonly known as: LIALDA TAKE 2 TABLETS BY MOUTH DAILY What changed: when to take this   multivitamin tablet Take 1 tablet by mouth daily. Woman 50 plus   nitroGLYCERIN 0.4 MG SL tablet Commonly known as: NITROSTAT Place 1 tablet (0.4 mg total) under the tongue every 5 (five) minutes as needed for chest pain.   olmesartan 5 MG tablet Commonly known as: Benicar Take 1 tablet (5 mg total) by mouth daily.   ondansetron 4 MG disintegrating tablet Commonly known as: ZOFRAN-ODT DISSOLVE 1 TABLET(4 MG) ON THE TONGUE EVERY 6 HOURS AS NEEDED FOR NAUSEA OR VOMITING What changed:   how much to take  how to take this  when to take this  reasons to take this  additional instructions   Ozempic (1 MG/DOSE) 4 MG/3ML Sopn Generic drug: Semaglutide (1 MG/DOSE) INJECT 1MG INTO THE SKIN ONCE A WEEK What changed:   how much to take  how to take this  when to take this  additional instructions   pantoprazole 40 MG tablet Commonly known as: PROTONIX TAKE 1 TABLET(40 MG) BY MOUTH TWICE DAILY What changed: See the new instructions.   polyethylene glycol 17 g packet Commonly known as: MIRALAX / GLYCOLAX Take 17 g by mouth 3 (three) times daily as needed for moderate constipation, mild constipation or severe constipation. In water or beverage   QUEtiapine 400 MG 24 hr tablet Commonly known as: SEROQUEL XR Take 800 mg by mouth at bedtime.   simethicone 80 MG chewable tablet Commonly known as: MYLICON Chew 80 mg by mouth 4 (four) times daily.   valACYclovir 1000 MG tablet Commonly known as: VALTREX TAKE 1 TABLET(1000 MG) BY MOUTH DAILY What changed:   how much to take  how to take this  when to take this  additional instructions   VITAMIN B COMPLEX PO Take 1 tablet by mouth every other day.   VITAMIN C PO Take 1,000 mg by mouth daily.   Vraylar capsule Generic drug:  cariprazine Take 3 mg by mouth at bedtime.   Vyvanse 50 MG capsule Generic drug: lisdexamfetamine Take 50 mg by mouth every morning.   zaleplon 10 MG capsule Commonly known as: SONATA Take 10-20 mg by mouth at bedtime.       Vitals:   04/07/20 0756 04/07/20 0854  BP: 112/73   Pulse: (!) 117   Resp: 16 20  Temp: 97.9 F (36.6 C)   SpO2: 94%     Skin clean, dry and intact without evidence of skin break down, no evidence of skin tears noted. IV catheter discontinued intact. Site without signs and symptoms of complications. Dressing and pressure applied. Pt denies pain at this time. No complaints noted.  An After Visit Summary was printed  and given to the patient. Patient escorted via [place], and D/C home via private auto.  [place]

## 2020-04-07 NOTE — Plan of Care (Signed)

## 2020-04-07 NOTE — Discharge Summary (Signed)
Discharge Summary    Patient ID: Lindsay Fry MRN: [id number]; DOB: 10/05/1960  Admit date: 04/06/2020 Discharge date: 04/07/2020  PCP:  Tower, Marne A, MD   [facility]  Cardiologist:  Timothy Gollan, MD  Advanced Practice Provider:  No care team member to display Electrophysiologist:  None   Discharge Diagnoses    Principal Problem:   Unstable angina (HCC) Active Problems:   Hyperlipidemia associated with type 2 diabetes mellitus (HCC)   Essential hypertension   Shortness of breath   DM (diabetes mellitus) (HCC)   Coronary artery disease involving native coronary artery of native heart with angina pectoris (HCC)   Obesity (BMI 30-39.9)   Diagnostic Studies/Procedures    Cardiac catheterization 04/06/20  Ost Ramus to Ramus lesion is 99% stenosed.  1st Mrg lesion is 100% stenosed.  2nd Mrg lesion is 95% stenosed.  Prox RCA lesion is 50% stenosed.  Acute Mrg lesion is 80% stenosed.  2nd Diag lesion is 70% stenosed.  Prox LAD lesion is 45% stenosed.  The left ventricular ejection fraction is 55-65% by visual estimate.  The left ventricular systolic function is normal.  LV end diastolic pressure is normal.  CORONARY STENT INTERVENTION    Conclusion  Conclusions: 1. Severe single-vessel coronary artery disease with 95% OM 2 stenosis and chronic total occlusion of ramus intermedius/OM1, as detailed in Dr. Gollan's diagnostic catheterization note.  Noncritical disease of the LAD/diagonal branches is also present. 2. Successful PCI to proximal OM2 using Resolute Onyx 2.75 x 12 mm drug-eluting stent with 0% residual stenosis and TIMI-3 flow.  Conclusions: 1. Favor dual antiplatelet therapy with aspirin and clopidogrel for up to 12 months.  Given questionable intolerance of aspirin I would favor trying for at least 1 month of DAPT before switching to clopidogrel alone. 2. Obtain cytochrome P450 2C19 genotype to ensure appropriate  response to clopidogrel. 3. Aggressive secondary prevention. 4. Overnight observation with plans for discharge home tomorrow if no post catheterization complications.  Christopher End, MD [facility]   Coronary Diagrams   Diagnostic Dominance: Left    Intervention      _____________   History of Present Illness     Lindsay Fry is a 60 y.o. female with hypertension, CAD status post non-STEMI 08/2014, hyperlipidemia, first-degree AV block, PACs/PVCs, bilateral carotid disease, COVID-19 02/18/2020, diabetes type 2, hypothyroidism, bipolar disorder, anxiety, fibromyalgia, migraine disorder, left-sided breast cancer status post bilateral mastectomy, prior history of smoking, ulcerative colitis who was admitted post cardiac catheterization.  The patient's cardiac history was back to 2016 when she was admitted with non-STEMI and catheterization showed severe ostial ramus intermedius disease, which was medically managed.  She otherwise had nonobstructive CAD.  Echo at that time showed EF 60 to 65%, grade 1 diastolic dysfunction. In 2018 she presented with dyspnea found to have new anterior T wave changes.  Subsequent nuclear stress test showed no new ischemia.  Also has a history of chest pressure from anxiety and hypertension.  Seen 03/10/20 in the office and reported SOB with frequent albuterol use. Also reported chest tightness and cough. Cardiac CT was ordered which showed aortic atherosclerosis, Cornary caclium score 105 (88th percentile for sex matched control), mild stenosis in the mLAD (25-49%), total occlusion (100%) of Ramus branch, and ultimately recommended cardiac cath.   Patient was seen in the office 04/03/2020 to review cardiac CT. She reported lightheadedness, palpitations. Denied chest pain, but had persistent SOB. Cardiac cath was ordered.  Hospital Course       Consultants: None  Patient was brought in for scheduled cardiac catheterization procedure on 04/06/20.  She underwent diagnostic cath showing 100% lesion 1st margin, 95% stenosis 2nd margin, 50% stenosis pRCA, 80% stenosis Acute Mrg, 70% stenosis 2nd diag, 45% stenosis pLAD, LVEF 55-65%, normal LVEDP. Patient was treated with DES to OM2. CTO of ramus intermedius with no intervention, as well as noncritical disease of LAD/diag branches. She was started on DAPT with Aspirin and Plavix, plan to continue for 12 months. Patient was admitted overnight for observation. Patient remained stable throughout the night. Cath site, right groin, with no complications. She ambulated without anginal symptoms. Plan to discharge patient on DAPT with Aspirin and plavix for 1 year. Continue other home medications. Patient has cardiology follow-up scheduled. Can consider addition of BB at follow-up.   Patient was evaluated by Dr. Gollan 04/07/20 and felt to be stable for discharge  GEN: Well nourished, well developed, in no acute distress. HEENT: normal. Neck: Supple, no JVD, carotid bruits, or masses. Cardiac: RRR, no murmurs, rubs, or gallops. No clubbing, cyanosis, edema.  Radials/DP/PT 2+ and equal bilaterally.  Respiratory:  Respirations regular and unlabored, clear to auscultation bilaterally. GI: Soft, nontender, nondistended MS: no deformity or atrophy. Skin: warm and dry, no rash. Neuro:  Strength and sensation are intact. Psych: Normal affect.  Did the patient have an acute coronary syndrome (MI, NSTEMI, STEMI, etc) this admission?:  No                               Did the patient have a percutaneous coronary intervention (stent / angioplasty)?:  Yes.     Cath/PCI Registry Performance & Quality Measures: 1. Aspirin prescribed? - Yes 2. ADP Receptor Inhibitor (Plavix/Clopidogrel, Brilinta/Ticagrelor or Effient/Prasugrel) prescribed (includes medically managed patients)? - Yes 3. High Intensity Statin (Lipitor 40-80mg or Crestor 20-40mg) prescribed? - Yes 4. For EF <40%, was ACEI/ARB prescribed? - Yes 5. For  EF <40%, Aldosterone Antagonist (Spironolactone or Eplerenone) prescribed? - Not Applicable (EF >/= 40%) 6. Cardiac Rehab Phase II ordered? - Yes       _____________  Discharge Vitals Blood pressure 112/73, pulse (!) 117, temperature 97.9 F (36.6 C), temperature source Oral, resp. rate 20, height 5' 5" (1.651 m), weight 84.8 kg, last menstrual period 08/23/2006, SpO2 94 %.  Filed Weights   04/06/20 0916  Weight: 84.8 kg    Labs & Radiologic Studies    CBC Recent Labs    04/07/20 0549  WBC 6.7  HGB 14.8  HCT 44.8  MCV 92.8  PLT 314   Basic Metabolic Panel Recent Labs    04/07/20 0549  NA 141  K 4.0  CL 110  CO2 25  GLUCOSE 113*  BUN 16  CREATININE 0.63  CALCIUM 8.9   Liver Function Tests No results for input(s): AST, ALT, ALKPHOS, BILITOT, PROT, ALBUMIN in the last 72 hours. No results for input(s): LIPASE, AMYLASE in the last 72 hours. High Sensitivity Troponin:   No results for input(s): TROPONINIHS in the last 720 hours.  BNP Invalid input(s): POCBNP D-Dimer No results for input(s): DDIMER in the last 72 hours. Hemoglobin A1C No results for input(s): HGBA1C in the last 72 hours. Fasting Lipid Panel No results for input(s): CHOL, HDL, LDLCALC, TRIG, CHOLHDL, LDLDIRECT in the last 72 hours. Thyroid Function Tests No results for input(s): TSH, T4TOTAL, T3FREE, THYROIDAB in the last 72 hours.  Invalid input(s): FREET3 _____________  CARDIAC CATHETERIZATION    Result Date: 04/06/2020 Conclusions: 1. Severe single-vessel coronary artery disease with 95% OM 2 stenosis and chronic total occlusion of ramus intermedius/OM1, as detailed in Dr. Gollan's diagnostic catheterization note.  Noncritical disease of the LAD/diagonal branches is also present. 2. Successful PCI to proximal OM2 using Resolute Onyx 2.75 x 12 mm drug-eluting stent with 0% residual stenosis and TIMI-3 flow. Conclusions: 1. Favor dual antiplatelet therapy with aspirin and clopidogrel for up to 12  months.  Given questionable intolerance of aspirin I would favor trying for at least 1 month of DAPT before switching to clopidogrel alone. 2. Obtain cytochrome P450 2C19 genotype to ensure appropriate response to clopidogrel. 3. Aggressive secondary prevention. 4. Overnight observation with plans for discharge home tomorrow if no post catheterization complications. Christopher End, MD [facility]   CARDIAC CATHETERIZATION  Result Date: 04/06/2020  Ost Ramus to Ramus lesion is 99% stenosed.  1st Mrg lesion is 100% stenosed.  2nd Mrg lesion is 95% stenosed.  Prox RCA lesion is 50% stenosed.  Acute Mrg lesion is 80% stenosed.  2nd Diag lesion is 70% stenosed.  Prox LAD lesion is 45% stenosed.  The left ventricular ejection fraction is 55-65% by visual estimate.  The left ventricular systolic function is normal.  LV end diastolic pressure is normal.    CT CORONARY MORPH W/CTA COR W/SCORE W/CA W/CM &/OR WO/CM  Addendum Date: 03/30/2020   ADDENDUM REPORT: 03/30/2020 15:05 ADDENDUM: OVER-READ INTERPRETATION  CT CHEST The following report is an over-read performed by radiologist Dr. Kyle Talbotof [facility] on 03/30/2020. This over-read does not include interpretation of cardiac or coronary anatomy or pathology. The coronary CTA interpretation by the cardiologist is attached. COMPARISON: 03/02/2020 FINDINGS: No pleural fluid. Persistent mosaic attenuation in the lung bases, favored to be due to subsegmental atelectasis in the setting of hypoventilation. Aortic atherosclerosis. No central pulmonary embolism, on this non-dedicated study. No imaged thoracic adenopathy. Normal imaged portions of the liver, spleen. Remote lower left rib fractures. IMPRESSION: 1.  No acute findings in the imaged extracardiac chest. 2.  Aortic Atherosclerosis (ICD10-I70.0). Electronically Signed   By: Kyle  Talbot M.D.   On: 03/30/2020 15:05   Result Date: 03/30/2020 CLINICAL DATA:  Chest pain, hx of CAD EXAM:  Cardiac/Coronary  CTA TECHNIQUE: The patient was scanned on a Siemens Somatoform go.Top scanner. FINDINGS: A retrospective scan was triggered in the descending thoracic aorta. Axial non-contrast 3 mm slices were carried out through the heart. The data set was analyzed on a dedicated work station and scored using the Agatson method. Gantry rotation speed was 330 msecs and collimation was .6 mm. 100mg of metoprolol and 0.8 mg of sl NTG was given. The 3D data set was reconstructed in 5% intervals of the 60-95 % of the R-R cycle. Diastolic phases were analyzed on a dedicated work station using MPR, MIP and VRT modes. The patient received 90 cc of contrast. Aorta: Normal size. Minimal aortic root wall calcifications. No dissection. Aortic Valve:  Trileaflet.  No calcifications. Coronary Arteries:  Normal coronary origin.  left dominance. RCA is a non dominant artery, there is no plaque. Left main is a large artery that gives rise to LAD, ramus and LCX arteries. The Ramus artery appears to be chronically occluded LAD is has calcified plaque in the proximal to mid segment causing mild 25-49% stenosis. LCX is a dominant artery with mild calcifications in the mid vessel causing minimal stenosis (<25%), at the bifurcation into OM1 branch. It continues to supply the PDA. Other   findings: Normal pulmonary vein drainage into the left atrium. Normal left atrial appendage without a thrombus. Normal size of the pulmonary artery. IMPRESSION: 1. Coronary calcium score of 105. This was 88th percentile for age and sex matched control. 2. Normal coronary origin with left dominance. 3. Mild stenosis in the mid LAD (25-49%) 4. Total occlusion (100%) of Ramus branch proximally, appears chronic. 5. CAD-RADS 5 Total coronary occlusion (100%). Consider cardiac catheterization or viability assessment. Consider symptom-guided anti-ischemic pharmacotherapy as well as risk factor modification per guideline directed care. 6. Study quality degraded  by motion artifacts Electronically Signed: By: Kate Sable M.D. On: 03/30/2020 14:07   DG BONE DENSITY (DXA)  Result Date: 03/08/2020 EXAM: DUAL X-RAY ABSORPTIOMETRY (DXA) FOR BONE MINERAL DENSITY IMPRESSION: Your patient Kawena Lyday completed a BMD test on 03/08/2020 using the [employer] (software version: 14.10) manufactured by [employer]. The following summarizes the results of our evaluation. Technologist: SCE PATIENT BIOGRAPHICAL: Name: Pearley, Millington Patient ID: [id number] Birth Date: 07-16-1960 Height: 65.3 in. Gender: Female Exam Date: 03/08/2020 Weight: 194.2 lbs. Indications: Asthma, Caucasian, DIABETES, Family Hist. (Parent hip fracture), Family History of Fracture, Family Hx of Osteoporosis, History of Breast Cancer, History of Fracture (Adult), Hypothyroid, Parent Hip Fracture, Postmenopausal, Previous Smoker, Scoliosis, Ulcerative Colitis Fractures: Ribs, lt femur Treatments: calcium w/ vit D, femara, gabapentin, Insulin, Jardiance, Lamictal, Multi-Vitamin with calcium, pepcid, Prednisone, Protonix, Synthroid, Tegretol, Vitamin D DENSITOMETRY RESULTS: Site         Region     Measured Date Measured Age WHO Classification Young Adult T-score BMD         %Change vs. Previous Significant Change (*) DualFemur Neck Left 1/08/2020 61.6 Normal -1.0 0.901 g/cm2 1.8% - DualFemur Neck Left 09/24/2016 58.1 Osteopenia -1.1 0.885 g/cm2 -7.4% Yes DualFemur Neck Left 07/20/2014 55.9 Normal -0.6 0.956 g/cm2 -2.5% - DualFemur Neck Left 08/17/2008 50.0 Normal -0.4 0.981 g/cm2 - - DualFemur Total Mean 03/08/2020 61.6 Normal -0.2 0.987 g/cm2 -2.1% Yes DualFemur Total Mean 09/24/2016 58.1 - - 1.008 g/cm2 -7.4% Yes DualFemur Total Mean 07/20/2014 55.9 Normal 0.6 1.088 g/cm2 4.5% Yes DualFemur Total Mean 08/17/2008 50.0 Normal 0.3 1.041 g/cm2 - - Left Forearm Radius 33% 03/08/2020 61.6 Normal -0.2 0.860 g/cm2 -8.4% Yes Left Forearm Radius 33% 09/24/2016 58.1 Normal 0.7 0.939 g/cm2  -1.1% - Left Forearm Radius 33% 07/20/2014 55.9 Normal 0.8 0.949 g/cm2 6.3% Yes Left Forearm Radius 33% 08/17/2008 50.0 Normal 0.2 0.893 g/cm2 - - ASSESSMENT: The BMD measured at Femur Neck Left is 0.901 g/cm2 with a T-score of -1.0. This patient is considered NORMAL according to [facility]) criteria. The scan quality is good. Compared with prior study, there has been a significant decrease in the total hip. Lumbar spine was not utilized due to advanced degenerative changes. [facility]) criteria for post-menopausal, Caucasian Women: Normal:                   T-score at or above -1 SD Osteopenia/low bone mass: T-score between -1 and -2.5 SD Osteoporosis:             T-score at or below -2.5 SD RECOMMENDATIONS: 1. All patients should optimize calcium and vitamin D intake. 2. Consider FDA-approved medical therapies in postmenopausal women and men aged 4 years and older, based on the following: a. A hip or vertebral(clinical or morphometric) fracture b. T-score < -2.5 at the femoral neck or spine after appropriate evaluation to exclude secondary causes c. Low bone  mass (T-score between -1.0 and -2.5 at the femoral neck or spine) and a 10-year probability of a hip fracture > 3% or a 10-year probability of a major osteoporosis-related fracture > 20% based on the [place]-adapted WHO algorithm 3. Clinician judgment and/or patient preferences may indicate treatment for people with 10-year fracture probabilities above or below these levels FOLLOW-UP: People with diagnosed cases of osteoporosis or at high risk for fracture should have regular bone mineral density tests. For patients eligible for Medicare, routine testing is allowed once every 2 years. The testing frequency can be increased to one year for patients who have rapidly progressing disease, those who are receiving or discontinuing medical therapy to restore bone mass, or have additional risk factors. I have reviewed this report,  and agree with the above findings. Mark A. Thornton Papas, M.D. [facility] Electronically Signed   By: Lavonia Dana M.D.   On: 03/08/2020 17:15   ECHOCARDIOGRAM COMPLETE  Result Date: 03/16/2020    ECHOCARDIOGRAM REPORT   Patient Name:   Lindsay Fry Date of Exam: 03/16/2020 Medical Rec #:  [id number]         Height:       65.5 in Accession #:    [id number]        Weight:       196.0 lb Date of Birth:  [birth year]          BSA:          1.972 m Patient Age:    60 years          BP:           118/72 mmHg Patient Gender: F                 HR:           98 bpm. Exam Location:  [facility] Procedure: 2D Echo, Cardiac Doppler, Color Doppler and Intracardiac            Opacification Agent Indications:    R06.02 SOB; I50.20* Unspecified systolic (congestive) heart                 failure  History:        Patient has prior history of Echocardiogram examinations, most                 recent 09/05/2014. CHF, CAD and Previous Myocardial Infarction,                 Arrythmias:PAC and PVC, Signs/Symptoms:Shortness of Breath; Risk                 Factors:Diabetes, Former Smoker and Covid +, 02/2020.  Sonographer:    Pilar Jarvis RDMS, RVT, RDCS Referring Phys: [id number] Arvil Chaco  Sonographer Comments: Image acquisition challenging due to mastectomy. IMPRESSIONS  1. Left ventricular ejection fraction, by estimation, is 50 to 55%. The left ventricle has low normal function. The left ventricle has no regional wall motion abnormalities. Left ventricular diastolic parameters are consistent with Grade I diastolic dysfunction (impaired relaxation).  2. Right ventricular systolic function is normal. The right ventricular size is normal.  3. Frequent PVCs FINDINGS  Left Ventricle: Left ventricular ejection fraction, by estimation, is 50 to 55%. The left ventricle has low normal function. The left ventricle has no regional wall motion abnormalities. Definity contrast agent was given IV to delineate the left ventricular  endocardial borders. The left ventricular internal cavity size was normal in size. There is no left ventricular  hypertrophy. Left ventricular diastolic parameters are consistent with Grade I diastolic dysfunction (impaired relaxation). Right Ventricle: The right ventricular size is normal. No increase in right ventricular wall thickness. Right ventricular systolic function is normal. Left Atrium: Left atrial size was normal in size. Right Atrium: Right atrial size was normal in size. Pericardium: There is no evidence of pericardial effusion. Mitral Valve: The mitral valve is normal in structure. No evidence of mitral valve regurgitation. No evidence of mitral valve stenosis. Tricuspid Valve: The tricuspid valve is normal in structure. Tricuspid valve regurgitation is mild . No evidence of tricuspid stenosis. Aortic Valve: The aortic valve was not well visualized. Aortic valve regurgitation is not visualized. No aortic stenosis is present. Aortic valve mean gradient measures 4.0 mmHg. Aortic valve peak gradient measures 8.0 mmHg. Aortic valve area, by VTI measures 2.89 cm. Pulmonic Valve: The pulmonic valve was normal in structure. Pulmonic valve regurgitation is not visualized. No evidence of pulmonic stenosis. Aorta: The aortic root is normal in size and structure. Venous: The inferior vena cava is normal in size with greater than 50% respiratory variability, suggesting right atrial pressure of 3 mmHg. IAS/Shunts: No atrial level shunt detected by color flow Doppler.  LEFT VENTRICLE PLAX 2D LVIDd:         4.00 cm  Diastology LVIDs:         3.00 cm  LV e' medial:   4.35 cm/s LV PW:         1.20 cm  LV E/e' medial: 16.5 LV IVS:        0.90 cm LVOT diam:     2.10 cm LV SV:         67 LV SV Index:   34 LVOT Area:     3.46 cm  RIGHT VENTRICLE             IVC RV S prime:     12.30 cm/s  IVC diam: 1.60 cm LEFT ATRIUM             Index       RIGHT ATRIUM           Index LA diam:        3.50 cm 1.77 cm/m  RA Area:      10.70 cm LA Vol (A2C):   33.6 ml 17.04 ml/m RA Volume:   20.50 ml  10.39 ml/m LA Vol (A4C):   43.9 ml 22.26 ml/m LA Biplane Vol: 40.3 ml 20.43 ml/m  AORTIC VALVE                   PULMONIC VALVE AV Area (Vmax):    2.65 cm    PV Vmax:       0.84 m/s AV Area (Vmean):   2.77 cm    PV Peak grad:  2.8 mmHg AV Area (VTI):     2.89 cm AV Vmax:           141.00 cm/s AV Vmean:          93.200 cm/s AV VTI:            0.231 m AV Peak Grad:      8.0 mmHg AV Mean Grad:      4.0 mmHg LVOT Vmax:         108.00 cm/s LVOT Vmean:        74.600 cm/s LVOT VTI:          0.193 m LVOT/AV VTI ratio: 0.84  AORTA Ao Root   diam: 3.50 cm Ao Asc diam:  3.10 cm Ao Arch diam: 2.4 cm MITRAL VALVE MV Area (PHT): 5.34 cm     SHUNTS MV Decel Time: 142 msec     Systemic VTI:  0.19 m MV E velocity: 71.60 cm/s   Systemic Diam: 2.10 cm MV A velocity: 108.00 cm/s MV E/A ratio:  0.66 Timothy Gollan MD Electronically signed by Timothy Gollan MD Signature Date/Time: 03/16/2020/10:10:11 PM    Final    Disposition   Pt is being discharged home today in good condition.  Follow-up Plans & Appointments     Follow-up Information    Gollan, Timothy J, MD Follow up on 04/11/2020.   Specialty: Cardiology Contact information: [address] [place] [address] [phone]              Discharge Instructions    AMB Referral to Cardiac Rehabilitation - Phase II   Complete by: As directed    Diagnosis: Coronary Stents   After initial evaluation and assessments completed: Virtual Based Care may be provided alone or in conjunction with Phase 2 Cardiac Rehab based on patient barriers.: Yes      Discharge Medications   Allergies as of 04/07/2020      Reactions   Ephedrine Other (See Comments)   Pt becomes hyper   Aspirin    REACTION: aggrivates colitis   Crestor [rosuvastatin]    REACTION: increased lfts   Erythromycin    REACTION: GI upset   Nsaids    REACTION: aggrivate colitis   Oxycodone Other (See  Comments)   Panic Attack      Medication List    TAKE these medications   acetaminophen 650 MG CR tablet Commonly known as: TYLENOL Take 1,300 mg by mouth 2 (two) times daily.   amantadine 100 MG capsule Commonly known as: SYMMETREL Take 100 mg by mouth 2 (two) times daily.   aspirin 81 MG chewable tablet Chew 1 tablet (81 mg total) by mouth daily.   atorvastatin 80 MG tablet Commonly known as: LIPITOR TAKE 1 TABLET(80 MG) BY MOUTH DAILY What changed: See the new instructions.   B-D UF III MINI PEN NEEDLES 31G X 5 MM Misc Generic drug: Insulin Pen Needle USE FOR INJECTIONS TWICE DAILY   Bayer Microlet Lancets lancets USE AS DIRECTED TO CHECK BLOOD SUGAR TWICE DAILY   CALTRATE 600+D3 PO Take 1 tablet by mouth daily. 300 mg Vitamin D3   cholecalciferol 25 MCG (1000 UNIT) tablet Commonly known as: VITAMIN D3 Take 1,000 Units by mouth daily.   clopidogrel 75 MG tablet Commonly known as: PLAVIX TAKE 1 TABLET(75 MG) BY MOUTH DAILY What changed: See the new instructions.   Co-Enzyme Q-10 100 MG Caps Take 100 mg by mouth daily.   Contour Next Monitor w/Device Kit 1 each by Does not apply route in the morning and at bedtime. Use Contour Next meter to check blood sugar twice daily.   Contour Next Test test strip Generic drug: glucose blood Use as instructed   CULTURELLE PROBIOTICS PO Take 1 capsule by mouth daily. For woman   diazepam 10 MG tablet Commonly known as: VALIUM Take 5-10 mg by mouth 2 (two) times daily as needed for anxiety.   diphenhydrAMINE 25 MG tablet Commonly known as: BENADRYL Take 25-50 mg by mouth at bedtime.   docusate sodium 100 MG capsule Commonly known as: COLACE Take 100-300 mg by mouth daily as needed for mild constipation or moderate constipation.   ezetimibe 10   MG tablet Commonly known as: ZETIA TAKE 1 TABLET(10 MG) BY MOUTH DAILY What changed: See the new instructions.   famotidine 40 MG tablet Commonly known as: PEPCID Take  40 mg by mouth 2 (two) times daily. 30 min. Before meal and at bedtime   gabapentin 300 MG capsule Commonly known as: NEURONTIN TAKE 1 CAPSULE(300 MG) BY MOUTH THREE TIMES DAILY What changed:   how much to take  how to take this  when to take this  additional instructions   Jardiance 25 MG Tabs tablet Generic drug: empagliflozin TAKE 1 TABLET(25 MG) BY MOUTH DAILY What changed: See the new instructions.   ketoconazole 2 % cream Commonly known as: NIZORAL Apply to affected areas one to two times daily as needed. What changed:   how much to take  how to take this  when to take this  reasons to take this  additional instructions   lamoTRIgine 200 MG tablet Commonly known as: LAMICTAL Take 400 mg by mouth daily.   latanoprost 0.005 % ophthalmic solution Commonly known as: XALATAN Place 1 drop into both eyes 2 (two) times a week.   Levemir FlexTouch 100 UNIT/ML FlexPen Generic drug: insulin detemir Inject 60 Units into the skin every evening.   Levoxyl 125 MCG tablet Generic drug: levothyroxine Take 2 tablets (250 mcg total) by mouth daily before breakfast.   linaclotide 290 MCG Caps capsule Commonly known as: LINZESS Take 290 mcg by mouth daily as needed (Constipation).   loperamide 2 MG tablet Commonly known as: IMODIUM A-D Take 2 mg by mouth daily as needed for diarrhea or loose stools.   Melatonin 10 MG Caps Take 10 mg by mouth at bedtime.   mesalamine 1.2 g EC tablet Commonly known as: LIALDA TAKE 2 TABLETS BY MOUTH DAILY What changed: when to take this   multivitamin tablet Take 1 tablet by mouth daily. Woman 50 plus   nitroGLYCERIN 0.4 MG SL tablet Commonly known as: NITROSTAT Place 1 tablet (0.4 mg total) under the tongue every 5 (five) minutes as needed for chest pain.   olmesartan 5 MG tablet Commonly known as: Benicar Take 1 tablet (5 mg total) by mouth daily.   ondansetron 4 MG disintegrating tablet Commonly known as:  ZOFRAN-ODT DISSOLVE 1 TABLET(4 MG) ON THE TONGUE EVERY 6 HOURS AS NEEDED FOR NAUSEA OR VOMITING What changed:   how much to take  how to take this  when to take this  reasons to take this  additional instructions   Ozempic (1 MG/DOSE) 4 MG/3ML Sopn Generic drug: Semaglutide (1 MG/DOSE) INJECT 1MG INTO THE SKIN ONCE A WEEK What changed:   how much to take  how to take this  when to take this  additional instructions   pantoprazole 40 MG tablet Commonly known as: PROTONIX TAKE 1 TABLET(40 MG) BY MOUTH TWICE DAILY What changed: See the new instructions.   polyethylene glycol 17 g packet Commonly known as: MIRALAX / GLYCOLAX Take 17 g by mouth 3 (three) times daily as needed for moderate constipation, mild constipation or severe constipation. In water or beverage   QUEtiapine 400 MG 24 hr tablet Commonly known as: SEROQUEL XR Take 800 mg by mouth at bedtime.   simethicone 80 MG chewable tablet Commonly known as: MYLICON Chew 80 mg by mouth 4 (four) times daily.   valACYclovir 1000 MG tablet Commonly known as: VALTREX TAKE 1 TABLET(1000 MG) BY MOUTH DAILY What changed:   how much to take  how to take this    when to take this  additional instructions   VITAMIN B COMPLEX PO Take 1 tablet by mouth every other day.   VITAMIN C PO Take 1,000 mg by mouth daily.   Vraylar capsule Generic drug: cariprazine Take 3 mg by mouth at bedtime.   Vyvanse 50 MG capsule Generic drug: lisdexamfetamine Take 50 mg by mouth every morning.   zaleplon 10 MG capsule Commonly known as: SONATA Take 10-20 mg by mouth at bedtime.          Outstanding Labs/Studies   N/A  Duration of Discharge Encounter   Greater than 30 minutes including physician time.  Signed, Cadence Ninfa Meeker, PA-C 04/07/2020, 10:37 AM

## 2020-04-07 NOTE — Progress Notes (Signed)
Progress Note  Patient Name: Lindsay Fry Date of Encounter: 04/07/2020  [facility] Cardiologist: Ida Rogue, MD   Subjective   Doing well this morning post cardiac catheterization Details of catheterization discussed with her again, Had difficulty sleeping, " did not get my sleeping pills right but I had my Valium" Reports she has not been sleeping well the past 3 nights, with anxiety  Inpatient Medications    Scheduled Meds: . acetaminophen  1,000 mg Oral Q8H  . amantadine  100 mg Oral BID  . aspirin  81 mg Oral Daily  . atorvastatin  80 mg Oral QPM  . cariprazine  3 mg Oral QHS  . clopidogrel  75 mg Oral Daily  . diphenhydrAMINE  25 mg Oral QHS  . enoxaparin (LOVENOX) injection  40 mg Subcutaneous Q24H  . ezetimibe  10 mg Oral Daily  . famotidine  40 mg Oral BID  . gabapentin  300 mg Oral TID  . insulin aspart  0-15 Units Subcutaneous TID WC  . insulin detemir  60 Units Subcutaneous QHS  . lamoTRIgine  400 mg Oral Daily  . levothyroxine  250 mcg Oral Q0600  . lisdexamfetamine  50 mg Oral q morning  . melatonin  10 mg Oral QHS  . mesalamine  2.4 g Oral Q breakfast  . pantoprazole  40 mg Oral BID  . QUEtiapine  800 mg Oral QHS  . simethicone  80 mg Oral QID  . sodium chloride flush  3 mL Intravenous Q12H  . valACYclovir  1,000 mg Oral Daily   Continuous Infusions: . sodium chloride Stopped (04/06/20 2000)   PRN Meds: sodium chloride, acetaminophen, diazepam, linaclotide, loperamide, nitroGLYCERIN, ondansetron (ZOFRAN) IV, polyethylene glycol, sodium chloride flush   Vital Signs    Vitals:   04/06/20 1939 04/07/20 0641 04/07/20 0756 04/07/20 0854  BP: (!) 134/56 122/63 112/73   Pulse: 86 95 (!) 117   Resp: 18 18 16 20   Temp: 97.7 F (36.5 C) 97.8 F (36.6 C) 97.9 F (36.6 C)   TempSrc: Oral Oral Oral   SpO2: 94% 93% 94%   Weight:      Height:        Intake/Output Summary (Last 24 hours) at 04/07/2020 1153 Last data filed at 04/06/2020  2200 Gross per 24 hour  Intake 440 ml  Output 850 ml  Net -410 ml   Last 3 Weights 04/06/2020 04/03/2020 03/30/2020  Weight (lbs) 187 lb 189 lb 193 lb  Weight (kg) 84.823 kg 85.73 kg 87.544 kg      Telemetry    Normal sinus rhythm- Personally Reviewed  ECG     - Personally Reviewed  Physical Exam   GEN: No acute distress.   Neck: No JVD Cardiac: RRR, no murmurs, rubs, or gallops.  Respiratory: Clear to auscultation bilaterally. GI: Soft, nontender, non-distended  MS: No edema; No deformity. Neuro:  Nonfocal  Psych: Normal affect   Labs    High Sensitivity Troponin:  No results for input(s): TROPONINIHS in the last 720 hours.    Chemistry Recent Labs  Lab 04/07/20 0549  NA 141  K 4.0  CL 110  CO2 25  GLUCOSE 113*  BUN 16  CREATININE 0.63  CALCIUM 8.9  GFRNONAA >60  ANIONGAP 6     Hematology Recent Labs  Lab 04/07/20 0549  WBC 6.7  RBC 4.83  HGB 14.8  HCT 44.8  MCV 92.8  MCH 30.6  MCHC 33.0  RDW 13.8  PLT 314  BNPNo results for input(s): BNP, PROBNP in the last 168 hours.   DDimer No results for input(s): DDIMER in the last 168 hours.   Radiology    CARDIAC CATHETERIZATION  Result Date: 04/06/2020 Conclusions: 1. Severe single-vessel coronary artery disease with 95% OM 2 stenosis and chronic total occlusion of ramus intermedius/OM1, as detailed in Dr. Donivan Scull diagnostic catheterization note.  Noncritical disease of the LAD/diagonal branches is also present. 2. Successful PCI to proximal OM2 using Resolute Onyx 2.75 x 12 mm drug-eluting stent with 0% residual stenosis and TIMI-3 flow. Conclusions: 1. Favor dual antiplatelet therapy with aspirin and clopidogrel for up to 12 months.  Given questionable intolerance of aspirin I would favor trying for at least 1 month of DAPT before switching to clopidogrel alone. 2. Obtain cytochrome P450 2C19 genotype to ensure appropriate response to clopidogrel. 3. Aggressive secondary prevention. 4. Overnight  observation with plans for discharge home tomorrow if no post catheterization complications. Nelva Bush, MD [facility]   CARDIAC CATHETERIZATION  Result Date: 04/06/2020  Ost Ramus to Ramus lesion is 99% stenosed.  1st Mrg lesion is 100% stenosed.  2nd Mrg lesion is 95% stenosed.  Prox RCA lesion is 50% stenosed.  Acute Mrg lesion is 80% stenosed.  2nd Diag lesion is 70% stenosed.  Prox LAD lesion is 45% stenosed.  The left ventricular ejection fraction is 55-65% by visual estimate.  The left ventricular systolic function is normal.  LV end diastolic pressure is normal.     Cardiac Studies   Cardiac catheterization April 06, 2020 Severe OM disease, likely culprit vessel Chronic occlusion of small ramus branch Moderate to severe disease of proximal diagonal #2  Mild to moderate proximal LAD disease  Recommendations:  Case discussed with interventional cardiology, Dr. Saunders Revel performing stenting of OM branch  Patient Profile   60 year old woman with history of hyperlipidemia, known coronary artery disease, bipolar disorder presenting with unstable angina symptoms, Covid infection last month, worsening shortness of breath, cardiac CTA with severe coronary disease Cardiac catheterization performed April 06, 2020, stent placed to OM vessel for severe stenosis  Assessment & Plan    Coronary artery disease with unstable angina Stent placed to OM vessel, Will stay on aspirin Plavix, beta-blocker statin/high intensity Lipitor 80 -Consider cardiac rehab.  Will discuss with her in outpatient follow-up  Chronic diastolic CHF Euvolemic, continue current outpatient medications  Hyperlipidemia Not on Repatha secondary to insurance issues Continue Lipitor Zetia  Diabetes type 2 A1c 6.7 We have encouraged continued exercise, careful diet management in an effort to lose weight.  Essential hypertension Blood pressure is well controlled on today's visit. No changes made to the  medications.  Recovery instructions provided following cardiac catheterization, discharge instructions discussed with her  Total encounter time more than 35 minutes  Greater than 50% was spent in counseling and coordination of care with the patient   For questions or updates, please contact [place] Please consult [email or web address] for contact info under        Signed, Ida Rogue, MD  04/07/2020, 11:53 AM

## 2020-04-09 ENCOUNTER — Other Ambulatory Visit: Payer: Self-pay | Admitting: Internal Medicine

## 2020-04-09 NOTE — H&P (Signed)
H&P Addendum, precardiac catheterization  Patient was seen and evaluated prior to Cardiac catheterization procedure Symptoms, prior testing details again confirmed with the patient Patient examined, no significant change from prior exam Lab work reviewed in detail personally by myself Patient understands risk and benefit of the procedure, willing to proceed  Signed, Esmond Plants, MD, Ph.D [facility]

## 2020-04-09 NOTE — Interval H&P Note (Signed)
History and Physical Interval Note:  04/09/2020 11:48 AM  Lindsay Fry  has presented today for surgery, with the diagnosis of Unstable angina and abnormal cardiac CTA.  The various methods of treatment have been discussed with the patient and family. After consideration of risks, benefits and other options for treatment, the patient has consented to  Procedure(s): LEFT HEART CATH AND CORONARY ANGIOGRAPHY (N/A) CORONARY STENT INTERVENTION (N/A) as a surgical intervention.  The patient's history has been reviewed, patient examined, no change in status, stable for surgery.  I have reviewed the patient's chart and labs.  Questions were answered to the patient's satisfaction.     Ida Rogue

## 2020-04-10 NOTE — Progress Notes (Signed)
Date:  04/11/2020   ID:  Lindsay Fry, DOB 10-01-1960, MRN [id number]  Patient Location:  [place] [id number]   Provider location:   [facility], [place] office  PCP:  Abner Greenspan, MD  Cardiologist:  Arvid Right [facility]   Chief Complaint  Patient presents with  . Follow-up    Post cath follow up. Medications verbally reviewed with patient.      History of Present Illness:    Lindsay Fry is a 60 y.o. female  past medical history of Morbid obesity,  CAD Severe ostial ramus disease treated medically by catheterization August 2016, hypertension,  hyperlipidemia,   previous symptoms of chest pain and shortness of breath.  Migraines Chronic tachycardia She presents today for follow-up of her coronary artery disease, hypertension, hyperlipidemia, recent cardiac catheterization with stent placement  Covid infection last month, worsening shortness of breath, cardiac CTA with severe coronary disease  Cardiac catheterization April 06, 2020 Severe OM disease, likely culprit vessel Chronic occlusion of small ramus branch Moderate to severe disease of proximal diagonal #2  Mild to moderate proximal LAD disease   underwent stenting of OM branch  Sleep disorder, attributes to bipolar  Wants to restart her water walking Has membership to [employer], has not gone in 3 years  Weight coming down 212 in 2019 194 this visit, through dietary changes and to COVID  Asymptomatic from her PVCs Still has a ZIO monitor in place  Lab work reviewed with her HBA1C 6.5  EKG personally reviewed by myself on todays visit shows  NSR rate PVCs, nonspecific T wave abnormality V6, 1 and aVL  Other past medical history Stress test was ordered November 14, 2016 This showed no significant ischemia, low risk scan, ejection fraction 74%  Previously had left arm pain coming on at rest. This seemed to resolve with NSAIDs. Also with several  episodes of higher blood pressure, possibly associated with stress. He states that she has been taking clonidine once a day in the evening, amlodipine in the morning  Vertigo symptoms occurred in early 2014 and recurrent August 9. She is taking meclizine. She has dizziness and feels that she is drunk when she stands up.  History of hyperlipidemia. In June 2010, total cholesterol 314.  Remote smoking for a short period of time, none recently.   Prior CV studies:   The following studies were reviewed today:   Past Medical History:  Diagnosis Date  . ADD (attention deficit disorder) 11/19/2012  . Allergic rhinitis, cause unspecified   . Anxiety state, unspecified   . Arthritis   . Asymptomatic postmenopausal status (age-related) (natural)   . Atypical hyperplasia of left breast 2000  . Atypical mole 03/16/2018   R paraspinal mid upper back, excision  . Atypical mole 02/17/2018   R spinal upper back, moderate  . Atypical mole 08/11/2017   L sternum, excision  . Atypical mole 05/27/2017   L post neck, moderate  . Benign neoplasm of other and unspecified site of the digestive system   . Bipolar disorder ([place])   . Breast cancer ([place])    Left Breast 9m invasive CA left uiq, dcis left uoq and a lot ADH, ALH in both breasts.  .Marland KitchenCAD (coronary artery disease)    a. 08/2014 NSTEMI/Cath: LM nl, LAD 50p, D1/D2 min irregs, LCX nl, OM1 40, LPDA nl, RI 99ost (2.020mvessel->med Rx), RCA nl, AM 60, nl EF; b. 11/2016 MV:  EF 74%, no ischemia (performed 2/2 dyspnea & new inf TWI).  Marland Kitchen Carcinoma of left breast ([place]) 06/02/2014  . Cataract   . Chronic diastolic CHF (congestive heart failure) ([place])    a. 08/2014 Echo: EF 60-65%, Gr 1 DD, nl LA.  Marland Kitchen Clotting disorder ([place])    on blood thiner, Plavix  . Cyst    on Achilles tendon  . Depression   . Diabetes mellitus ([place])    frank  . Dysuria   . Edema   . Family history of malignant neoplasm of gastrointestinal tract   . Fibromyalgia   . GERD  (gastroesophageal reflux disease)   . Glaucoma    pre glaucoma, 05/14/2018 pt. denies glaucoma  . Headache    migraines  . Hiatal hernia   . History of alcoholism ([place])   . History of migraines   . History of ovarian cyst   . Hx of adenomatous colonic polyps   . Insomnia, unspecified   . Myocardial infarction ([place]) 09-04-14  . OCD (obsessive compulsive disorder)   . Osteopenia 09/25/2016   Femoral neck T -1.1  9/18  . Other screening mammogram   . Pure hypercholesterolemia   . Rosacea   . Subacute confusional state 11/19/2012  . Tubular adenoma of colon 01/10/11  . Ulcerative colitis, unspecified   . Unspecified asthma(493.90)   . Unspecified essential hypertension   . Unspecified hypothyroidism   . Unspecified vitamin D deficiency   . Vertigo    Past Surgical History:  Procedure Laterality Date  . APPENDECTOMY    . AXILLARY SENTINEL NODE BIOPSY Left 05/23/2014   Procedure: AXILLARY SENTINEL NODE BIOPSY;  Surgeon: Christene Lye, MD;  Location: [facility] ORS;  Service: General;  Laterality: Left;  . BREAST BIOPSY  Aug 2000   on tamoxifen, atypical hyperplasia  . BREAST BIOPSY Left 04/2014  . BREAST SURGERY Left Aug 2000   lumpectomy/ Dr Sharlet Salina  . BREAST SURGERY Bilateral 05/23/14   Mastectomy  . BUNIONECTOMY Right   . CARDIAC CATHETERIZATION N/A 09/05/2014   Procedure: Left Heart Cath and Coronary Angiography;  Surgeon: Wellington Hampshire, MD;  Location: [facility];  Service: Cardiovascular;  Laterality: N/A;  . CARDIAC CATHETERIZATION  09-05-14  . CATARACT EXTRACTION W/ INTRAOCULAR LENS IMPLANT Bilateral   . CESAREAN SECTION  1996/1997   placenta previa, gest DM, pre-eclampsia  . CHOLECYSTECTOMY  1997   adhesions also  . COLONOSCOPY  01/2001   Ulcerative colitis  . COLONOSCOPY  04/2004   UC, polyp  . COLONOSCOPY  12/08   UC, no polyps  . CORONARY STENT INTERVENTION N/A 04/06/2020   Procedure: CORONARY STENT INTERVENTION;  Surgeon: Nelva Bush, MD;   Location: [facility];  Service: Cardiovascular;  Laterality: N/A;  . DEXA  04/1999 and 2010   normal  . ESOPHAGOGASTRODUODENOSCOPY  09/2001   polyp  . exercise stress test  11/2003   negative  . FEMUR FRACTURE SURGERY Left   . LEFT HEART CATH AND CORONARY ANGIOGRAPHY N/A 04/06/2020   Procedure: LEFT HEART CATH AND CORONARY ANGIOGRAPHY;  Surgeon: Minna Merritts, MD;  Location: [facility];  Service: Cardiovascular;  Laterality: N/A;  . MASTECTOMY Bilateral   . MECKEL DIVERTICULUM EXCISION  1999  . NASAL SINUS SURGERY  05/1997  . nuclear stress test  06/2007   negative  . precancerous mole removed    . SIMPLE MASTECTOMY WITH AXILLARY SENTINEL NODE BIOPSY Bilateral 05/23/2014   Procedure: Bilateral simple mastectomy, left sentinel  node biopsy ;  Surgeon: Christene Lye, MD;  Location: [facility] OR[facility];  Service: General;  Laterality: Bilateral;  . Sleep study  11/09   no apnea, but did snore (done by [facility])  . TONSILLECTOMY  1984  . TUBAL LIGATION    . WISDOM TOOTH EXTRACTION  1980's     Allergies:   Ephedrine, Aspirin, Crestor [rosuvastatin], Erythromycin, Nsaids, and Oxycodone   Social History   Tobacco Use  . Smoking status: Former Smoker    Packs/day: 0.25    Years: 5.00    Pack years: 1.25    Types: Cigarettes    Quit date: 01/14/1993    Years since quitting: 27.2  . Smokeless tobacco: Never Used  Vaping Use  . Vaping Use: Never used  Substance Use Topics  . Alcohol use: No    Alcohol/week: 0.0 standard drinks    Comment: Recovered ETOH  . Drug use: No     Current Outpatient Medications on File Prior to Visit  Medication Sig Dispense Refill  . acetaminophen (TYLENOL) 650 MG CR tablet Take 1,300 mg by mouth 2 (two) times daily.    Marland Kitchen amantadine (SYMMETREL) 100 MG capsule Take 100 mg by mouth 2 (two) times daily.    . Ascorbic Acid (VITAMIN C PO) Take 1,000 mg by mouth daily.    Marland Kitchen aspirin 81 MG chewable tablet Chew 1 tablet (81 mg total) by mouth  daily. 90 tablet 3  . atorvastatin (LIPITOR) 80 MG tablet TAKE 1 TABLET(80 MG) BY MOUTH DAILY 90 tablet 1  . B Complex Vitamins (VITAMIN B COMPLEX PO) Take 1 tablet by mouth every other day.    Marland Kitchen BAYER MICROLET LANCETS lancets USE AS DIRECTED TO CHECK BLOOD SUGAR TWICE DAILY 200 each 2  . Blood Glucose Monitoring Suppl (CONTOUR NEXT MONITOR) w/Device KIT 1 each by Does not apply route in the morning and at bedtime. Use Contour Next meter to check blood sugar twice daily. 1 kit 0  . Calcium Carb-Cholecalciferol (CALTRATE 600+D3 PO) Take 1 tablet by mouth daily. 300 mg Vitamin D3    . cholecalciferol (VITAMIN D3) 25 MCG (1000 UNIT) tablet Take 1,000 Units by mouth daily.    . clopidogrel (PLAVIX) 75 MG tablet TAKE 1 TABLET(75 MG) BY MOUTH DAILY 90 tablet 0  . Co-Enzyme Q-10 100 MG CAPS Take 100 mg by mouth daily.    . diazepam (VALIUM) 10 MG tablet Take 5-10 mg by mouth 2 (two) times daily as needed for anxiety.    . diphenhydrAMINE (BENADRYL) 25 MG tablet Take 25-50 mg by mouth at bedtime.    . docusate sodium (COLACE) 100 MG capsule Take 100-300 mg by mouth daily as needed for mild constipation or moderate constipation.    Marland Kitchen ezetimibe (ZETIA) 10 MG tablet TAKE 1 TABLET(10 MG) BY MOUTH DAILY 90 tablet 3  . famotidine (PEPCID) 40 MG tablet Take 40 mg by mouth 2 (two) times daily. 30 min. Before meal and at bedtime    . gabapentin (NEURONTIN) 300 MG capsule TAKE 1 CAPSULE(300 MG) BY MOUTH THREE TIMES DAILY 90 capsule 11  . glucose blood (CONTOUR NEXT TEST) test strip Use as instructed 100 each 12  . insulin detemir (LEVEMIR FLEXTOUCH) 100 UNIT/ML FlexPen Inject 60 Units into the skin every evening.    . Insulin Pen Needle (B-D UF III MINI PEN NEEDLES) 31G X 5 MM MISC USE FOR INJECTIONS TWICE DAILY 100 each 12  . JARDIANCE 25 MG TABS tablet TAKE 1 TABLET(25 MG)  BY MOUTH DAILY (Patient taking differently: Take 25 mg by mouth daily.) 30 tablet 2  . ketoconazole (NIZORAL) 2 % cream Apply to affected  areas one to two times daily as needed. (Patient taking differently: Apply 1 application topically 2 (two) times daily as needed (Skin conduction).) 60 g 2  . lamoTRIgine (LAMICTAL) 200 MG tablet Take 400 mg by mouth daily.    Marland Kitchen latanoprost (XALATAN) 0.005 % ophthalmic solution Place 1 drop into both eyes 2 (two) times a week.     Marland Kitchen LEVOXYL 125 MCG tablet Take 2 tablets (250 mcg total) by mouth daily before breakfast. 60 tablet 3  . linaclotide (LINZESS) 290 MCG CAPS capsule Take 290 mcg by mouth daily as needed (Constipation).    Marland Kitchen loperamide (IMODIUM A-D) 2 MG tablet Take 2 mg by mouth daily as needed for diarrhea or loose stools.    . Melatonin 10 MG CAPS Take 10 mg by mouth at bedtime.    . mesalamine (LIALDA) 1.2 g EC tablet TAKE 2 TABLETS BY MOUTH DAILY 60 tablet 2  . Multiple Vitamin (MULTIVITAMIN) tablet Take 1 tablet by mouth daily. Woman 50 plus    . nitroGLYCERIN (NITROSTAT) 0.4 MG SL tablet Place 1 tablet (0.4 mg total) under the tongue every 5 (five) minutes as needed for chest pain. 25 tablet 4  . olmesartan (BENICAR) 5 MG tablet Take 1 tablet (5 mg total) by mouth daily. 30 tablet 5  . ondansetron (ZOFRAN-ODT) 4 MG disintegrating tablet DISSOLVE 1 TABLET(4 MG) ON THE TONGUE EVERY 6 HOURS AS NEEDED FOR NAUSEA OR VOMITING (Patient taking differently: Take 4 mg by mouth every 6 (six) hours as needed for vomiting or nausea.) 90 tablet 1  . pantoprazole (PROTONIX) 40 MG tablet TAKE 1 TABLET(40 MG) BY MOUTH TWICE DAILY (Patient taking differently: Take 40 mg by mouth 2 (two) times daily. 30 min before breakfast and at bedtime) 60 tablet 6  . polyethylene glycol (MIRALAX / GLYCOLAX) 17 g packet Take 17 g by mouth 3 (three) times daily as needed for moderate constipation, mild constipation or severe constipation. In water or beverage    . Probiotic Product (CULTURELLE PROBIOTICS PO) Take 1 capsule by mouth daily. For woman    . QUEtiapine (SEROQUEL XR) 400 MG 24 hr tablet Take 800 mg by mouth  at bedtime.    . Semaglutide, 1 MG/DOSE, (OZEMPIC, 1 MG/DOSE,) 4 MG/3ML SOPN INJECT 1MG INTO THE SKIN ONCE A WEEK (Patient taking differently: Inject 1 mg as directed every Monday.) 3 mL 3  . simethicone (MYLICON) 80 MG chewable tablet Chew 80 mg by mouth 4 (four) times daily.    . valACYclovir (VALTREX) 1000 MG tablet TAKE 1 TABLET(1000 MG) BY MOUTH DAILY (Patient taking differently: Take 1,000 mg by mouth daily.) 30 tablet 3  . VRAYLAR capsule Take 3 mg by mouth at bedtime.    Marland Kitchen VYVANSE 50 MG capsule Take 50 mg by mouth every morning.    . zaleplon (SONATA) 10 MG capsule Take 10-20 mg by mouth at bedtime.     No current facility-administered medications on file prior to visit.     Family Hx: The patient's family history includes Alzheimer's disease in her father; Breast cancer in an other family member; Colon cancer in her father; Colon polyps in her father; Coronary artery disease in her father, mother, and another family member; Crohn's disease in her mother; Dementia in her father; Diabetes in an other family member; Hypertension in her mother; Osteoporosis in her  mother; Stroke in her cousin. There is no history of Esophageal cancer, Rectal cancer, or Stomach cancer.  ROS:   Please see the history of present illness.    Review of Systems  Constitutional: Negative.   HENT: Negative.   Respiratory: Negative.   Cardiovascular: Negative.   Gastrointestinal: Negative.   Musculoskeletal: Negative.   Neurological: Negative.   Psychiatric/Behavioral: Negative.   All other systems reviewed and are negative.    Labs/Other Tests and Data Reviewed:    Recent Labs: 03/07/2020: TSH 2.87 03/24/2020: ALT 70 04/07/2020: BUN 16; Creatinine, Ser 0.63; Hemoglobin 14.8; Platelets 314; Potassium 4.0; Sodium 141   Recent Lipid Panel Lab Results  Component Value Date/Time   CHOL 120 03/07/2020 09:37 AM   TRIG 172.0 (H) 03/07/2020 09:37 AM   HDL 40.30 03/07/2020 09:37 AM   CHOLHDL 3 03/07/2020  09:37 AM   LDLCALC 46 03/07/2020 09:37 AM   LDLDIRECT 125.0 02/07/2020 11:34 AM    Wt Readings from Last 3 Encounters:  04/11/20 194 lb (88 kg)  04/06/20 187 lb (84.8 kg)  04/03/20 189 lb (85.7 kg)     Exam:    BP 110/76 (BP Location: Right Arm, Patient Position: Sitting, Cuff Size: Normal)   Pulse 87   Ht 5' 5"  (1.651 m)   Wt 194 lb (88 kg)   LMP 08/23/2006 (Approximate) Comment: tubal ligation  SpO2 97%   BMI 32.28 kg/m  Constitutional:  oriented to person, place, and time. No distress.  Obese HENT:  Head: Grossly normal Eyes:  no discharge. No scleral icterus.  Neck: No JVD, no carotid bruits  Cardiovascular: Regular rate and rhythm, no murmurs appreciated Pulmonary/Chest: Clear to auscultation bilaterally, no wheezes or rails Abdominal: Soft.  no distension.  no tenderness.  Musculoskeletal: Normal range of motion Neurological:  normal muscle tone. Coordination normal. No atrophy Skin: Skin warm and dry Psychiatric: normal affect, pleasant   ASSESSMENT & PLAN:    Problem List Items Addressed This Visit      Cardiology Problems   Essential hypertension   Coronary artery disease involving native coronary artery of native heart with angina pectoris ([place]) - Primary   Unstable angina (HCC)     Other   DM (diabetes mellitus) ([place]) (Chronic)   History of COVID-19    Other Visit Diagnoses    History of tobacco use       Hyperlipidemia LDL goal <70         CAD with stable angina Recent stent placement ostial OM vessel On asa and plavix Cholesterol at goal  Hyperlipidemia Cholesterol is at goal on the current lipid regimen. No changes to the medications were made.  Bipolar  followed by psychiatrist Stable  Obesity Discussed strategies for weight loss, she has lost significant weight since 2019 Dietary changes suggested, recommend she restart her water walking, She does have a membership to [employer] but has not been going, recommended she go with a  friend and do recumbent bike     Total encounter time more than 25 minutes  Greater than 50% was spent in counseling and coordination of care with the patient    Signed, Ida Rogue, [place] [address], [place], [place] [address]

## 2020-04-11 ENCOUNTER — Ambulatory Visit (INDEPENDENT_AMBULATORY_CARE_PROVIDER_SITE_OTHER): Payer: BC Managed Care – PPO | Admitting: Cardiovascular Disease

## 2020-04-11 ENCOUNTER — Encounter: Payer: Self-pay | Admitting: Cardiovascular Disease

## 2020-04-11 ENCOUNTER — Other Ambulatory Visit: Payer: Self-pay

## 2020-04-11 VITALS — BP 110/76 | HR 87 | Ht 65.0 in | Wt 194.0 lb

## 2020-04-11 DIAGNOSIS — I200 Unstable angina: Secondary | ICD-10-CM | POA: Diagnosis not present

## 2020-04-11 DIAGNOSIS — Z8616 Personal history of COVID-19: Secondary | ICD-10-CM

## 2020-04-11 DIAGNOSIS — Z87891 Personal history of nicotine dependence: Secondary | ICD-10-CM

## 2020-04-11 DIAGNOSIS — E1159 Type 2 diabetes mellitus with other circulatory complications: Secondary | ICD-10-CM | POA: Diagnosis not present

## 2020-04-11 DIAGNOSIS — I10 Essential (primary) hypertension: Secondary | ICD-10-CM

## 2020-04-11 DIAGNOSIS — I25119 Atherosclerotic heart disease of native coronary artery with unspecified angina pectoris: Secondary | ICD-10-CM | POA: Diagnosis not present

## 2020-04-11 DIAGNOSIS — E785 Hyperlipidemia, unspecified: Secondary | ICD-10-CM

## 2020-04-11 MED ORDER — ATORVASTATIN CALCIUM 80 MG PO TABS: ORAL_TABLET | ORAL | 3 refills | Status: DC

## 2020-04-11 NOTE — Patient Instructions (Signed)
Medication Instructions:  No changes  If you need a refill on your cardiac medications before your next appointment, please call your pharmacy.    Lab work: No new labs needed   If you have labs (blood work) drawn today and your tests are completely normal, you will receive your results only by: . MyChart Message (if you have MyChart) OR . A paper copy in the mail If you have any lab test that is abnormal or we need to change your treatment, we will call you to review the results.   Testing/Procedures: No new testing needed   Follow-Up: At [facility], you and your health needs are our priority.  As part of our continuing mission to provide you with exceptional heart care, we have created designated Provider Care Teams.  These Care Teams include your primary Cardiologist (physician) and Advanced Practice Providers (APPs -  Physician Assistants and Nurse Practitioners) who all work together to provide you with the care you need, when you need it.  . You will need a follow up appointment in 6 months  . Providers on your designated Care Team:   . Christopher Berge, NP . Ryan Dunn, PA-C . Jacquelyn Visser, PA-C  Any Other Special Instructions Will Be Listed Below (If Applicable).  COVID-19 Vaccine Information can be found at: [email or web address] For questions related to vaccine distribution or appointments, please email [email or web address] or call [phone].     

## 2020-04-12 ENCOUNTER — Other Ambulatory Visit: Payer: Self-pay

## 2020-04-12 ENCOUNTER — Ambulatory Visit (INDEPENDENT_AMBULATORY_CARE_PROVIDER_SITE_OTHER): Payer: BC Managed Care – PPO | Admitting: Family Medicine

## 2020-04-12 ENCOUNTER — Encounter: Payer: Self-pay | Admitting: Family Medicine

## 2020-04-12 VITALS — BP 90/60 | HR 112 | Temp 97.6°F | Ht 65.0 in | Wt 191.2 lb

## 2020-04-12 DIAGNOSIS — E1159 Type 2 diabetes mellitus with other circulatory complications: Secondary | ICD-10-CM | POA: Diagnosis not present

## 2020-04-12 DIAGNOSIS — L299 Pruritus, unspecified: Secondary | ICD-10-CM | POA: Diagnosis not present

## 2020-04-12 LAB — GLUCOSE, POCT (MANUAL RESULT ENTRY): POC Glucose: 164 mg/dl — AB (ref 70–99)

## 2020-04-12 MED ORDER — TRIAMCINOLONE ACETONIDE 0.1 % EX CREA: 1.0000 "application " | TOPICAL_CREAM | Freq: Two times a day (BID) | CUTANEOUS | 1 refills | Status: DC

## 2020-04-12 NOTE — Progress Notes (Signed)
T. , MD, [place] [facility] [place], [address]  Phone: [phone]  FAX: [phone]  Lindsay Fry - 60 y.o. female  MRN [id number]  Date of Birth: July 08, 1960  Date: 04/12/2020  PCP: Abner Greenspan, MD  Referral: Abner Greenspan, MD  Chief Complaint  Patient presents with  . Itching but no rash    Started 2 days ago    This visit occurred during the SARS-CoV-2 public health emergency.  Safety protocols were in place, including screening questions prior to the visit, additional usage of staff PPE, and extensive cleaning of exam room while observing appropriate contact time as indicated for disinfecting solutions.   Subjective:   Lindsay Fry is a 60 y.o. very pleasant female patient with Body mass index is 31.83 kg/m. who presents with the following:  The patient presents with some general rash, but with minimal actual rash and fairly diffuse itching.  She often will have a rash underneath her breast, however this is not similar at all that this.  The night before last, the patient did notice that she had some diffuse itching.  There is a minimal fine rash that she can feel in a few places.  Itching presents all the way from the thoracic spine all the way around the back into her front torso, abdomen, and on her arms as well.  She has no known ingestions, no new supplements, and the only thing that was added to her medications recently was aspirin.  She has taken this for her entire life.  She also has no new topical exposures with the exception of some underwear a few weeks ago.  Benadryl did seem to help some.  Alleve topically did help No pain.  Only aspirin new.  Review of Systems is noted in the HPI, as appropriate  Objective:   BP 90/60   Pulse (!) 112   Temp 97.6 F (36.4 C) (Temporal)   Ht 5' 5" (1.651 m)   Wt 191 lb 4 oz (86.8 kg)    LMP 08/23/2006 (Approximate) Comment: tubal ligation  SpO2 96%   BMI 31.83 kg/m   GEN: No acute distress; alert,appropriate. PULM: Breathing comfortably in no respiratory distress PSYCH: Normally interactive.  This portion of the physical examination was chaperoned by Hedy Camara, CMA.   Patient plain no rashes observed, there are a few areas of very small bumps that the patient alerts to my attention.  Laboratory and Imaging Data:  Assessment and Plan:     ICD-10-CM   1. Itching  L29.9   2. Type 2 diabetes mellitus with other circulatory complication, unspecified whether long term insulin use (HCC)  E11.59 POCT glucose (manual entry)   Itching and rash of unclear origin.  Continue with antihistamines.  I would take great care to avoid steroids in a stable type I bipolar patient.  Patient Instructions  If you can tolerate it from a drowsiness standpoint:  Benadryl 50 mg Three times a day    Meds ordered this encounter  Medications  . triamcinolone (KENALOG) 0.1 %    Sig: Apply 1 application topically 2 (two) times daily.    Dispense:  454 g    Refill:  1   There are no discontinued medications. Orders Placed This Encounter  Procedures  . POCT glucose (manual entry)    Follow-up: No follow-ups on file.  Signed,  Maud Deed.  , MD   Outpatient Encounter Medications as of 04/12/2020  Medication Sig  . acetaminophen (TYLENOL) 650 MG CR tablet Take 1,300 mg by mouth 2 (two) times daily.  Marland Kitchen amantadine (SYMMETREL) 100 MG capsule Take 100 mg by mouth 2 (two) times daily.  . Ascorbic Acid (VITAMIN C PO) Take 1,000 mg by mouth daily.  Marland Kitchen aspirin 81 MG chewable tablet Chew 1 tablet (81 mg total) by mouth daily.  Marland Kitchen atorvastatin (LIPITOR) 80 MG tablet Take one pill daily  . B Complex Vitamins (VITAMIN B COMPLEX PO) Take 1 tablet by mouth every other day.  Marland Kitchen BAYER MICROLET LANCETS lancets USE AS DIRECTED TO CHECK BLOOD SUGAR TWICE DAILY  . Blood Glucose Monitoring  Suppl (CONTOUR NEXT MONITOR) w/Device KIT 1 each by Does not apply route in the morning and at bedtime. Use Contour Next meter to check blood sugar twice daily.  . Calcium Carb-Cholecalciferol (CALTRATE 600+D3 PO) Take 1 tablet by mouth daily. 300 mg Vitamin D3  . cholecalciferol (VITAMIN D3) 25 MCG (1000 UNIT) tablet Take 1,000 Units by mouth daily.  . clopidogrel (PLAVIX) 75 MG tablet TAKE 1 TABLET(75 MG) BY MOUTH DAILY  . Co-Enzyme Q-10 100 MG CAPS Take 100 mg by mouth daily.  . diazepam (VALIUM) 10 MG tablet Take 5-10 mg by mouth 2 (two) times daily as needed for anxiety.  . diphenhydrAMINE (BENADRYL) 25 MG tablet Take 25-50 mg by mouth at bedtime.  . docusate sodium (COLACE) 100 MG capsule Take 100-300 mg by mouth daily as needed for mild constipation or moderate constipation.  Marland Kitchen ezetimibe (ZETIA) 10 MG tablet TAKE 1 TABLET(10 MG) BY MOUTH DAILY  . famotidine (PEPCID) 40 MG tablet Take 40 mg by mouth 2 (two) times daily. 30 min. Before meal and at bedtime  . gabapentin (NEURONTIN) 300 MG capsule TAKE 1 CAPSULE(300 MG) BY MOUTH THREE TIMES DAILY  . glucose blood (CONTOUR NEXT TEST) test strip Use as instructed  . insulin detemir (LEVEMIR FLEXTOUCH) 100 UNIT/ML FlexPen Inject 60 Units into the skin every evening.  . Insulin Pen Needle (B-D UF III MINI PEN NEEDLES) 31G X 5 MM MISC USE FOR INJECTIONS TWICE DAILY  . JARDIANCE 25 MG TABS tablet TAKE 1 TABLET(25 MG) BY MOUTH DAILY (Patient taking differently: Take 25 mg by mouth daily.)  . ketoconazole (NIZORAL) 2 % cream Apply to affected areas one to two times daily as needed. (Patient taking differently: Apply 1 application topically 2 (two) times daily as needed (Skin conduction).)  . lamoTRIgine (LAMICTAL) 200 MG tablet Take 400 mg by mouth daily.  Marland Kitchen latanoprost (XALATAN) 0.005 % ophthalmic solution Place 1 drop into both eyes 2 (two) times a week.   Marland Kitchen LEVOXYL 125 MCG tablet Take 2 tablets (250 mcg total) by mouth daily before breakfast.  .  linaclotide (LINZESS) 290 MCG CAPS capsule Take 290 mcg by mouth daily as needed (Constipation).  Marland Kitchen loperamide (IMODIUM A-D) 2 MG tablet Take 2 mg by mouth daily as needed for diarrhea or loose stools.  . Melatonin 10 MG CAPS Take 10 mg by mouth at bedtime.  . mesalamine (LIALDA) 1.2 g EC tablet TAKE 2 TABLETS BY MOUTH DAILY  . Multiple Vitamin (MULTIVITAMIN) tablet Take 1 tablet by mouth daily. Woman 50 plus  . nitroGLYCERIN (NITROSTAT) 0.4 MG SL tablet Place 1 tablet (0.4 mg total) under the tongue every 5 (five) minutes as needed for chest pain.  Marland Kitchen olmesartan (BENICAR) 5 MG tablet Take 1 tablet (5 mg total) by mouth  daily.  . ondansetron (ZOFRAN-ODT) 4 MG disintegrating tablet DISSOLVE 1 TABLET(4 MG) ON THE TONGUE EVERY 6 HOURS AS NEEDED FOR NAUSEA OR VOMITING (Patient taking differently: Take 4 mg by mouth every 6 (six) hours as needed for vomiting or nausea.)  . pantoprazole (PROTONIX) 40 MG tablet TAKE 1 TABLET(40 MG) BY MOUTH TWICE DAILY (Patient taking differently: Take 40 mg by mouth 2 (two) times daily. 30 min before breakfast and at bedtime)  . polyethylene glycol (MIRALAX / GLYCOLAX) 17 g packet Take 17 g by mouth 3 (three) times daily as needed for moderate constipation, mild constipation or severe constipation. In water or beverage  . Probiotic Product (CULTURELLE PROBIOTICS PO) Take 1 capsule by mouth daily. For woman  . QUEtiapine (SEROQUEL XR) 400 MG 24 hr tablet Take 800 mg by mouth at bedtime.  . Semaglutide, 1 MG/DOSE, (OZEMPIC, 1 MG/DOSE,) 4 MG/3ML SOPN INJECT 1MG INTO THE SKIN ONCE A WEEK (Patient taking differently: Inject 1 mg as directed every Monday.)  . simethicone (MYLICON) 80 MG chewable tablet Chew 80 mg by mouth 4 (four) times daily.  Marland Kitchen triamcinolone (KENALOG) 0.1 % Apply 1 application topically 2 (two) times daily.  . valACYclovir (VALTREX) 1000 MG tablet TAKE 1 TABLET(1000 MG) BY MOUTH DAILY (Patient taking differently: Take 1,000 mg by mouth daily.)  . VRAYLAR  capsule Take 3 mg by mouth at bedtime.  Marland Kitchen VYVANSE 50 MG capsule Take 50 mg by mouth every morning.  . zaleplon (SONATA) 10 MG capsule Take 10-20 mg by mouth at bedtime.   No facility-administered encounter medications on file as of 04/12/2020.

## 2020-04-12 NOTE — Patient Instructions (Signed)
If you can tolerate it from a drowsiness standpoint:  Benadryl 50 mg Three times a day

## 2020-04-14 ENCOUNTER — Ambulatory Visit: Payer: BC Managed Care – PPO | Admitting: Internal Medicine

## 2020-04-20 DIAGNOSIS — I471 Supraventricular tachycardia: Secondary | ICD-10-CM | POA: Diagnosis not present

## 2020-04-20 DIAGNOSIS — I493 Ventricular premature depolarization: Secondary | ICD-10-CM | POA: Diagnosis not present

## 2020-04-21 LAB — CYTOCHROME P450 2C19: 2C19 Metabolic Activity:: NORMAL

## 2020-04-24 ENCOUNTER — Telehealth: Payer: Self-pay | Admitting: Internal Medicine

## 2020-04-24 NOTE — Telephone Encounter (Signed)
Patient left message on 4/8 to reschedule her lab and md appointments that were previously canceled due to [place].  I called and was sent to VM.  Rescheduled for 4/18 at 3pm, sent MyChart message to inform patient and letting her know if this does not work to reply to my message.

## 2020-04-26 ENCOUNTER — Telehealth: Payer: Self-pay

## 2020-04-26 ENCOUNTER — Telehealth: Payer: Self-pay | Admitting: *Deleted

## 2020-04-26 NOTE — Telephone Encounter (Signed)
Patient calling to let [facility] know she received the msg and is doing fine

## 2020-04-26 NOTE — Telephone Encounter (Signed)
Dr.Perry,  This patient was seen by Anderson Malta, [place] on 03/14/2020 for [facility] and hx colon polyps. She is on Plavix. Since the OV she had a cardiac cath on 04/06/2020 and the cardiology notes from today states for her to "Would try to stay on both asa and plavix  If major issue with asa and can not stay on it, would let us know, then would stay on plavix alone indefinitely"  Please review cardiac clearance note from 03/14/2020. I was unsure if we got Plavix clearance hold. Colonoscopy with you is scheduled for 05/18/2020 at [facility]. Ok to proceed. Please advise. Thank you, Ramonia Mcclaran PV

## 2020-04-26 NOTE — Telephone Encounter (Signed)
Was unable to reach pt regarding her recent lab work while under hospital admission, LDM on VM (DPR approved). Dr. Saunders Revel received results and consulted Dr. Rockey Situ. Dr. Rockey Situ advised based on lab results  "Would try to stay on both asa and plavix  If major issue with asa and can not stay on it, would let us know, then would stay on plavix alone indefinitely"  This advice was also uploaded to pt's MyChart by Dr. Rockey Situ, advised with any concerns or questions then to call the clinic.

## 2020-04-27 NOTE — Telephone Encounter (Signed)
Cancell scheduled [facility] procedure  We will need to postpone colon for 1 year given her cardiac issues (I'm comfortable with this) She needs to me in the office in one year  Thanks   JP

## 2020-04-30 ENCOUNTER — Other Ambulatory Visit: Payer: Self-pay | Admitting: Endocrinology

## 2020-04-30 DIAGNOSIS — E1165 Type 2 diabetes mellitus with hyperglycemia: Secondary | ICD-10-CM

## 2020-04-30 DIAGNOSIS — Z794 Long term (current) use of insulin: Secondary | ICD-10-CM

## 2020-04-30 DIAGNOSIS — E890 Postprocedural hypothyroidism: Secondary | ICD-10-CM

## 2020-05-01 ENCOUNTER — Encounter: Payer: Self-pay | Admitting: Internal Medicine

## 2020-05-01 ENCOUNTER — Inpatient Hospital Stay (HOSPITAL_BASED_OUTPATIENT_CLINIC_OR_DEPARTMENT_OTHER): Payer: BC Managed Care – PPO | Admitting: Internal Medicine

## 2020-05-01 ENCOUNTER — Inpatient Hospital Stay: Payer: BC Managed Care – PPO | Attending: Internal Medicine

## 2020-05-01 DIAGNOSIS — Z800 Family history of malignant neoplasm of digestive organs: Secondary | ICD-10-CM | POA: Insufficient documentation

## 2020-05-01 DIAGNOSIS — Z87891 Personal history of nicotine dependence: Secondary | ICD-10-CM | POA: Diagnosis not present

## 2020-05-01 DIAGNOSIS — I2510 Atherosclerotic heart disease of native coronary artery without angina pectoris: Secondary | ICD-10-CM | POA: Diagnosis not present

## 2020-05-01 DIAGNOSIS — C50812 Malignant neoplasm of overlapping sites of left female breast: Secondary | ICD-10-CM

## 2020-05-01 DIAGNOSIS — Z170 Estrogen receptor positive status [ER+]: Secondary | ICD-10-CM

## 2020-05-01 DIAGNOSIS — F319 Bipolar disorder, unspecified: Secondary | ICD-10-CM | POA: Insufficient documentation

## 2020-05-01 DIAGNOSIS — Z79811 Long term (current) use of aromatase inhibitors: Secondary | ICD-10-CM

## 2020-05-01 DIAGNOSIS — R5383 Other fatigue: Secondary | ICD-10-CM | POA: Diagnosis not present

## 2020-05-01 DIAGNOSIS — Z9013 Acquired absence of bilateral breasts and nipples: Secondary | ICD-10-CM | POA: Insufficient documentation

## 2020-05-01 DIAGNOSIS — Z853 Personal history of malignant neoplasm of breast: Secondary | ICD-10-CM | POA: Insufficient documentation

## 2020-05-01 DIAGNOSIS — K5190 Ulcerative colitis, unspecified, without complications: Secondary | ICD-10-CM | POA: Insufficient documentation

## 2020-05-01 DIAGNOSIS — Z955 Presence of coronary angioplasty implant and graft: Secondary | ICD-10-CM | POA: Insufficient documentation

## 2020-05-01 DIAGNOSIS — Z803 Family history of malignant neoplasm of breast: Secondary | ICD-10-CM | POA: Diagnosis not present

## 2020-05-01 DIAGNOSIS — Z7902 Long term (current) use of antithrombotics/antiplatelets: Secondary | ICD-10-CM | POA: Insufficient documentation

## 2020-05-01 DIAGNOSIS — D751 Secondary polycythemia: Secondary | ICD-10-CM | POA: Diagnosis not present

## 2020-05-01 LAB — COMPREHENSIVE METABOLIC PANEL
ALT: 42 U/L (ref 0–44)
AST: 35 U/L (ref 15–41)
Albumin: 4.6 g/dL (ref 3.5–5.0)
Alkaline Phosphatase: 88 U/L (ref 38–126)
Anion gap: 9 (ref 5–15)
BUN: 17 mg/dL (ref 8–23)
CO2: 24 mmol/L (ref 22–32)
Calcium: 9.5 mg/dL (ref 8.9–10.3)
Chloride: 108 mmol/L (ref 98–111)
Creatinine, Ser: 0.75 mg/dL (ref 0.44–1.00)
GFR, Estimated: >60 mL/min (ref >60)
Glucose, Bld: 128 mg/dL — ABNORMAL HIGH (ref 70–99)
Potassium: 3.8 mmol/L (ref 3.5–5.1)
Sodium: 141 mmol/L (ref 135–145)
Total Bilirubin: 0.8 mg/dL (ref 0.3–1.2)
Total Protein: 6.9 g/dL (ref 6.5–8.1)

## 2020-05-01 LAB — CBC WITH DIFFERENTIAL/PLATELET
Abs Immature Granulocytes: 0.01 10*3/uL (ref 0.00–0.07)
Basophils Absolute: 0 10*3/uL (ref 0.0–0.1)
Basophils Relative: 1 %
Eosinophils Absolute: 0.2 10*3/uL (ref 0.0–0.5)
Eosinophils Relative: 3 %
HCT: 45.8 % (ref 36.0–46.0)
Hemoglobin: 15.3 g/dL — ABNORMAL HIGH (ref 12.0–15.0)
Immature Granulocytes: 0 %
Lymphocytes Relative: 26 %
Lymphs Abs: 1.8 10*3/uL (ref 0.7–4.0)
MCH: 30.9 pg (ref 26.0–34.0)
MCHC: 33.4 g/dL (ref 30.0–36.0)
MCV: 92.5 fL (ref 80.0–100.0)
Monocytes Absolute: 0.6 10*3/uL (ref 0.1–1.0)
Monocytes Relative: 9 %
Neutro Abs: 4.4 10*3/uL (ref 1.7–7.7)
Neutrophils Relative %: 61 %
Platelets: 302 10*3/uL (ref 150–400)
RBC: 4.95 MIL/uL (ref 3.87–5.11)
RDW: 13.6 % (ref 11.5–15.5)
WBC: 7.2 10*3/uL (ref 4.0–10.5)
nRBC: 0 % (ref 0.0–0.2)

## 2020-05-01 NOTE — Progress Notes (Signed)
[facility] OFFICE PROGRESS NOTE  Patient Care Team: Tower, Wynelle Fanny, MD as PCP - General Rockey Situ, Kathlene November, MD as PCP - Cardiology (Cardiology) Elayne Snare, MD as Consulting Physician (Endocrinology) Chucky May, MD as Consulting Physician (Psychiatry) Minna Merritts, MD as Consulting Physician (Cardiology)  Cancer Staging History of breast cancer Staging form: Breast, AJCC 7th Edition - Clinical: Stage IA (T1b, N0, M0) - Unsigned Laterality: Bilateral Method of lymph node assessment: Sentinel lymph node biopsy Estrogen receptor status: Positive Progesterone receptor status: Positive HER2 status: Negative Multi-gene signature risk of recurrence: Low risk Staging comments: Kidney frequent changes in the 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  No invasive cancer in the right breast.  Status post bilateral mastectomy    Oncology History Overview Note  1.  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  2, April of 2016 patient had  stereotactic biopsy of abnormal breast lesion in the left breastwhich was positive for invasive carcinoma and ductal carcinoma in situ.  Patient underwent bilateral mastectomy  May 9 , 2016  Patient has invasive carcinoma and left breast with significant changes in the right breast ductal carcinoma in situ patient underwent bilateral mastectomy.  Left breast: T1 b N0 M0 stage IB estrogen and progesterone receptor HER-2/neu- Not overexpressed  2.    Multi-gene analysis with  MAMOPRINT low risk for recurrent disease  Patient was started on letrozole and calcium and vitamin D (June, 2016)   # Hx Anxiety/depression [Dr.kaur; [place]]; Hx of colitis [s/p colo- May 2019]; Obesity -------------------------------------------------------------------------------------  # SURVIVORSHIP-P  DIAGNOSIS:LEFT BREAST CA ER-POS  STAGE: I        ;GOALS:  curative  CURRENT/MOST RECENT THERAPY: Letrozole-x5 years stopped August 2021.    History of breast cancer  05/23/2014 Initial Diagnosis   Carcinoma of left breast   Carcinoma of overlapping sites of left breast in female, estrogen receptor positive ([place])      INTERVAL HISTORY:  Lindsay Fry 60 y.o.  female pleasant patient above history of stage I breast cancer ER PR positive HER-2 negative currently s/p adjuvant letrozole is here for follow-up/review results of the bone density test.  Patient interim had to postpone her appointment with [place] given the recent COVID infection.  Recovered fairly well without any major hospitalization.  In the interim patient diagnosed with CAD s/p cardiac catheterization; s/p stenting.  Patient is on dual antiplatelet therapy.  Otherwise patient denies any blood in stools or black-colored stools.  Complains of fatigue.  Continues to follow-up with psychiatry for her bipolar disease.  Her ulcerative colitis has not flared up recently.  Her colonoscopy is on hold given the dual antiplatelet therapy.  Review of Systems  Constitutional: Positive for malaise/fatigue. Negative for chills, diaphoresis, fever and weight loss.  HENT: Negative for nosebleeds and sore throat.   Eyes: Negative for double vision.  Respiratory: Negative for cough, hemoptysis, sputum production, shortness of breath and wheezing.   Cardiovascular: Negative for chest pain, palpitations, orthopnea and leg swelling.  Gastrointestinal: Negative for abdominal pain, blood in stool, constipation, heartburn, melena, nausea and vomiting.  Genitourinary: Negative for dysuria, frequency and urgency.  Musculoskeletal: Positive for back pain and joint pain.  Skin: Negative.  Negative for itching and rash.  Neurological: Negative for dizziness, tingling, focal weakness, weakness and headaches.  Endo/Heme/Allergies: Does not bruise/bleed easily.  Psychiatric/Behavioral: Positive for  depression. The patient is nervous/anxious  and has insomnia.       PAST MEDICAL HISTORY :  Past Medical History:  Diagnosis Date  . ADD (attention deficit disorder) 11/19/2012  . Allergic rhinitis, cause unspecified   . Anxiety state, unspecified   . Arthritis   . Asymptomatic postmenopausal status (age-related) (natural)   . Atypical hyperplasia of left breast 2000  . Atypical mole 03/16/2018   R paraspinal mid upper back, excision  . Atypical mole 02/17/2018   R spinal upper back, moderate  . Atypical mole 08/11/2017   L sternum, excision  . Atypical mole 05/27/2017   L post neck, moderate  . Benign neoplasm of other and unspecified site of the digestive system   . Bipolar disorder ([place])   . Breast cancer ([place])    Left Breast 54m invasive CA left uiq, dcis left uoq and a lot ADH, ALH in both breasts.  .Marland KitchenCAD (coronary artery disease)    a. 08/2014 NSTEMI/Cath: LM nl, LAD 50p, D1/D2 min irregs, LCX nl, OM1 40, LPDA nl, RI 99ost (2.039mvessel->med Rx), RCA nl, AM 60, nl EF; b. 11/2016 MV: EF 74%, no ischemia (performed 2/2 dyspnea & new inf TWI).  . Marland Kitchenarcinoma of left breast ([place]/19/2016  . Cataract   . Chronic diastolic CHF (congestive heart failure) ([facility]   a. 08/2014 Echo: EF 60-65%, Gr 1 DD, nl LA.  . Marland Kitchenlotting disorder ([facility]   on blood thiner, Plavix  . Cyst    on Achilles tendon  . Depression   . Diabetes mellitus ([facility]   frank  . Dysuria   . Edema   . Family history of malignant neoplasm of gastrointestinal tract   . Fibromyalgia   . GERD (gastroesophageal reflux disease)   . Glaucoma    pre glaucoma, 05/14/2018 pt. denies glaucoma  . Headache    migraines  . Hiatal hernia   . History of alcoholism ([place]  . History of migraines   . History of ovarian cyst   . Hx of adenomatous colonic polyps   . Insomnia, unspecified   . Myocardial infarction ([facility]8-21-16  . OCD (obsessive compulsive disorder)   . Osteopenia 09/25/2016   Femoral neck T -1.1  9/18  .  Other screening mammogram   . Pure hypercholesterolemia   . Rosacea   . Subacute confusional state 11/19/2012  . Tubular adenoma of colon 01/10/11  . Ulcerative colitis, unspecified   . Unspecified asthma(493.90)   . Unspecified essential hypertension   . Unspecified hypothyroidism   . Unspecified vitamin D deficiency   . Vertigo     PAST SURGICAL HISTORY :   Past Surgical History:  Procedure Laterality Date  . APPENDECTOMY    . AXILLARY SENTINEL NODE BIOPSY Left 05/23/2014   Procedure: AXILLARY SENTINEL NODE BIOPSY;  Surgeon: SeChristene LyeMD;  Location: [facility] ORS;  Service: General;  Laterality: Left;  . BREAST BIOPSY  Aug 2000   on tamoxifen, atypical hyperplasia  . BREAST BIOPSY Left 04/2014  . BREAST SURGERY Left Aug 2000   lumpectomy/ Dr CrSharlet Salina. BREAST SURGERY Bilateral 05/23/14   Mastectomy  . BUNIONECTOMY Right   . CARDIAC CATHETERIZATION N/A 09/05/2014   Procedure: Left Heart Cath and Coronary Angiography;  Surgeon: MuWellington HampshireMD;  [facility];  Service: Cardiovascular;  Laterality: N/A;  . CARDIAC CATHETERIZATION  09-05-14  . CATARACT EXTRACTION W/ INTRAOCULAR LENS IMPLANT Bilateral   . CESAREAN SECTION  1996/1997   placenta previa,  gest DM, pre-eclampsia  . CHOLECYSTECTOMY  1997   adhesions also  . COLONOSCOPY  01/2001   Ulcerative colitis  . COLONOSCOPY  04/2004   UC, polyp  . COLONOSCOPY  12/08   UC, no polyps  . CORONARY STENT INTERVENTION N/A 04/06/2020   Procedure: CORONARY STENT INTERVENTION;  Surgeon: Nelva Bush, MD;  Location: [place];  Service: Cardiovascular;  Laterality: N/A;  . DEXA  04/1999 and 2010   normal  . ESOPHAGOGASTRODUODENOSCOPY  09/2001   polyp  . exercise stress test  11/2003   negative  . FEMUR FRACTURE SURGERY Left   . LEFT HEART CATH AND CORONARY ANGIOGRAPHY N/A 04/06/2020   Procedure: LEFT HEART CATH AND CORONARY ANGIOGRAPHY;  Surgeon: Minna Merritts, MD;  Location: [place] [facility]  CV LAB;  Service: Cardiovascular;  Laterality: N/A;  . MASTECTOMY Bilateral   . MECKEL DIVERTICULUM EXCISION  1999  . NASAL SINUS SURGERY  05/1997  . nuclear stress test  06/2007   negative  . precancerous mole removed    . SIMPLE MASTECTOMY WITH AXILLARY SENTINEL NODE BIOPSY Bilateral 05/23/2014   Procedure: Bilateral simple mastectomy, left sentinel node biopsy ;  Surgeon: Christene Lye, MD;  Location: [facility] ORS;  Service: General;  Laterality: Bilateral;  . Sleep study  11/09   no apnea, but did snore (done by [facility])  . TONSILLECTOMY  1984  . TUBAL LIGATION    . WISDOM TOOTH EXTRACTION  1980's    FAMILY HISTORY :   Family History  Problem Relation Age of Onset  . Coronary artery disease Father   . Colon cancer Father   . Alzheimer's disease Father   . Dementia Father   . Colon polyps Father   . Coronary artery disease Mother   . Hypertension Mother   . Osteoporosis Mother   . Crohn's disease Mother        crohns colitis  . Breast cancer Other        great aunts  . Coronary artery disease Other        Uncle (also AAA)  . Diabetes Other        remote family history  . Stroke Cousin   . Esophageal cancer Neg Hx   . Rectal cancer Neg Hx   . Stomach cancer Neg Hx     SOCIAL HISTORY:   Social History   Tobacco Use  . Smoking status: Former Smoker    Packs/day: 0.25    Years: 5.00    Pack years: 1.25    Types: Cigarettes    Quit date: 01/14/1993    Years since quitting: 27.3  . Smokeless tobacco: Never Used  Vaping Use  . Vaping Use: Never used  Substance Use Topics  . Alcohol use: No    Alcohol/week: 0.0 standard drinks    Comment: Recovered ETOH  . Drug use: No    ALLERGIES:  is allergic to ephedrine, aspirin, crestor [rosuvastatin], erythromycin, nsaids, and oxycodone.  MEDICATIONS:  Current Outpatient Medications  Medication Sig Dispense Refill  . acetaminophen (TYLENOL) 650 MG CR tablet Take 1,300 mg by mouth 2 (two) times daily.    Marland Kitchen  amantadine (SYMMETREL) 100 MG capsule Take 100 mg by mouth 2 (two) times daily.    . Ascorbic Acid (VITAMIN C PO) Take 1,000 mg by mouth daily.    Marland Kitchen aspirin 81 MG chewable tablet Chew 1 tablet (81 mg total) by mouth daily. 90 tablet 3  . atorvastatin (LIPITOR) 80 MG tablet  Take one pill daily 90 tablet 3  . B Complex Vitamins (VITAMIN B COMPLEX PO) Take 1 tablet by mouth every other day.    Marland Kitchen BAYER MICROLET LANCETS lancets USE AS DIRECTED TO CHECK BLOOD SUGAR TWICE DAILY 200 each 2  . Blood Glucose Monitoring Suppl (CONTOUR NEXT MONITOR) w/Device KIT 1 each by Does not apply route in the morning and at bedtime. Use Contour Next meter to check blood sugar twice daily. 1 kit 0  . Calcium Carb-Cholecalciferol (CALTRATE 600+D3 PO) Take 1 tablet by mouth daily. 300 mg Vitamin D3    . cholecalciferol (VITAMIN D3) 25 MCG (1000 UNIT) tablet Take 1,000 Units by mouth daily.    . clopidogrel (PLAVIX) 75 MG tablet TAKE 1 TABLET(75 MG) BY MOUTH DAILY 90 tablet 0  . Co-Enzyme Q-10 100 MG CAPS Take 100 mg by mouth daily.    . diazepam (VALIUM) 10 MG tablet Take 5-10 mg by mouth 2 (two) times daily as needed for anxiety.    . diphenhydrAMINE (BENADRYL) 25 MG tablet Take 25-50 mg by mouth at bedtime.    . docusate sodium (COLACE) 100 MG capsule Take 100-300 mg by mouth daily as needed for mild constipation or moderate constipation.    Marland Kitchen ezetimibe (ZETIA) 10 MG tablet TAKE 1 TABLET(10 MG) BY MOUTH DAILY 90 tablet 3  . famotidine (PEPCID) 40 MG tablet Take 40 mg by mouth 2 (two) times daily. 30 min. Before meal and at bedtime    . gabapentin (NEURONTIN) 300 MG capsule TAKE 1 CAPSULE(300 MG) BY MOUTH THREE TIMES DAILY 90 capsule 11  . glucose blood (CONTOUR NEXT TEST) test strip Use as instructed 100 each 12  . insulin detemir (LEVEMIR FLEXTOUCH) 100 UNIT/ML FlexPen Inject 60 Units into the skin every evening.    . Insulin Pen Needle (B-D UF III MINI PEN NEEDLES) 31G X 5 MM MISC USE FOR INJECTIONS TWICE DAILY 100  each 12  . JARDIANCE 25 MG TABS tablet TAKE 1 TABLET(25 MG) BY MOUTH DAILY (Patient taking differently: Take 25 mg by mouth daily.) 30 tablet 2  . ketoconazole (NIZORAL) 2 % cream Apply to affected areas one to two times daily as needed. (Patient taking differently: Apply 1 application topically 2 (two) times daily as needed (Skin conduction).) 60 g 2  . lamoTRIgine (LAMICTAL) 200 MG tablet Take 400 mg by mouth daily.    Marland Kitchen latanoprost (XALATAN) 0.005 % ophthalmic solution Place 1 drop into both eyes 2 (two) times a week.     Marland Kitchen LEVOXYL 125 MCG tablet Take 2 tablets (250 mcg total) by mouth daily before breakfast. 60 tablet 3  . linaclotide (LINZESS) 290 MCG CAPS capsule Take 290 mcg by mouth daily as needed (Constipation).    Marland Kitchen loperamide (IMODIUM A-D) 2 MG tablet Take 2 mg by mouth daily as needed for diarrhea or loose stools.    . Melatonin 10 MG CAPS Take 10 mg by mouth at bedtime.    . mesalamine (LIALDA) 1.2 g EC tablet TAKE 2 TABLETS BY MOUTH DAILY 60 tablet 2  . Multiple Vitamin (MULTIVITAMIN) tablet Take 1 tablet by mouth daily. Woman 50 plus    . nitroGLYCERIN (NITROSTAT) 0.4 MG SL tablet Place 1 tablet (0.4 mg total) under the tongue every 5 (five) minutes as needed for chest pain. 25 tablet 4  . olmesartan (BENICAR) 5 MG tablet Take 1 tablet (5 mg total) by mouth daily. 30 tablet 5  . ondansetron (ZOFRAN-ODT) 4 MG disintegrating tablet DISSOLVE 1  TABLET(4 MG) ON THE TONGUE EVERY 6 HOURS AS NEEDED FOR NAUSEA OR VOMITING (Patient taking differently: Take 4 mg by mouth every 6 (six) hours as needed for vomiting or nausea.) 90 tablet 1  . pantoprazole (PROTONIX) 40 MG tablet TAKE 1 TABLET(40 MG) BY MOUTH TWICE DAILY (Patient taking differently: Take 40 mg by mouth 2 (two) times daily. 30 min before breakfast and at bedtime) 60 tablet 6  . polyethylene glycol (MIRALAX / GLYCOLAX) 17 g packet Take 17 g by mouth 3 (three) times daily as needed for moderate constipation, mild constipation or severe  constipation. In water or beverage    . Probiotic Product (CULTURELLE PROBIOTICS PO) Take 1 capsule by mouth daily. For woman    . QUEtiapine (SEROQUEL XR) 400 MG 24 hr tablet Take 800 mg by mouth at bedtime.    . Semaglutide, 1 MG/DOSE, (OZEMPIC, 1 MG/DOSE,) 4 MG/3ML SOPN INJECT 1MG INTO THE SKIN ONCE A WEEK (Patient taking differently: Inject 1 mg as directed every Monday.) 3 mL 3  . simethicone (MYLICON) 80 MG chewable tablet Chew 80 mg by mouth 4 (four) times daily.    Marland Kitchen triamcinolone (KENALOG) 0.1 % Apply 1 application topically 2 (two) times daily. 454 g 1  . valACYclovir (VALTREX) 1000 MG tablet TAKE 1 TABLET(1000 MG) BY MOUTH DAILY (Patient taking differently: Take 1,000 mg by mouth daily.) 30 tablet 3  . VRAYLAR capsule Take 3 mg by mouth at bedtime.    Marland Kitchen VYVANSE 50 MG capsule Take 50 mg by mouth every morning.    . zaleplon (SONATA) 10 MG capsule Take 10-20 mg by mouth at bedtime.     No current facility-administered medications for this visit.    PHYSICAL EXAMINATION: ECOG PERFORMANCE STATUS: 0 - Asymptomatic  BP (!) 162/105 (BP Location: Right Arm, Patient Position: Sitting, Cuff Size: Normal)   Pulse (!) 101   Temp 98.6 F (37 C) (Tympanic)   Resp 16   Ht 5' 5"  (1.651 m)   Wt 188 lb (85.3 kg)   LMP 08/23/2006 (Approximate) Comment: tubal ligation  SpO2 98%   BMI 31.28 kg/m   Filed Weights   05/01/20 1508  Weight: 188 lb (85.3 kg)    Physical Exam Constitutional:      Comments: Alone.   HENT:     Head: Normocephalic and atraumatic.     Mouth/Throat:     Pharynx: No oropharyngeal exudate.  Eyes:     Pupils: Pupils are equal, round, and reactive to light.  Cardiovascular:     Rate and Rhythm: Normal rate and regular rhythm.  Pulmonary:     Effort: Pulmonary effort is normal. No respiratory distress.     Breath sounds: Normal breath sounds. No wheezing.  Abdominal:     General: Bowel sounds are normal. There is no distension.     Palpations: Abdomen is soft.  There is no mass.     Tenderness: There is no abdominal tenderness. There is no guarding or rebound.  Musculoskeletal:        General: No tenderness. Normal range of motion.     Cervical back: Normal range of motion and neck supple.  Skin:    General: Skin is warm.     Comments: Bilateral mastectomy noted.  No lumps or bumps.  Neurological:     Mental Status: She is alert and oriented to person, place, and time.  Psychiatric:        Mood and Affect: Affect normal.  LABORATORY DATA:  I have reviewed the data as listed    Component Value Date/Time   NA 141 05/01/2020 1452   K 3.8 05/01/2020 1452   CL 108 05/01/2020 1452   CO2 24 05/01/2020 1452   GLUCOSE 128 (H) 05/01/2020 1452   BUN 17 05/01/2020 1452   CREATININE 0.75 05/01/2020 1452   CALCIUM 9.5 05/01/2020 1452   PROT 6.9 05/01/2020 1452   ALBUMIN 4.6 05/01/2020 1452   AST 35 05/01/2020 1452   ALT 42 05/01/2020 1452   ALKPHOS 88 05/01/2020 1452   BILITOT 0.8 05/01/2020 1452   GFRNONAA >60 05/01/2020 1452   GFRAA >60 08/30/2019 1437    No results found for: SPEP, UPEP  Lab Results  Component Value Date   WBC 7.2 05/01/2020   NEUTROABS 4.4 05/01/2020   HGB 15.3 (H) 05/01/2020   HCT 45.8 05/01/2020   MCV 92.5 05/01/2020   PLT 302 05/01/2020      Chemistry      Component Value Date/Time   NA 141 05/01/2020 1452   K 3.8 05/01/2020 1452   CL 108 05/01/2020 1452   CO2 24 05/01/2020 1452   BUN 17 05/01/2020 1452   CREATININE 0.75 05/01/2020 1452      Component Value Date/Time   CALCIUM 9.5 05/01/2020 1452   ALKPHOS 88 05/01/2020 1452   AST 35 05/01/2020 1452   ALT 42 05/01/2020 1452   BILITOT 0.8 05/01/2020 1452       RADIOGRAPHIC STUDIES: I have personally reviewed the radiological images as listed and agreed with the findings in the report. No results found.   ASSESSMENT & PLAN:  Carcinoma of overlapping sites of left breast in female, estrogen receptor positive ([place]) # LEFT Breast Stage  I s/p mastec [prophy right mastec] on Low risk mammoprint- finished Letrozole [5 years; AUG 2021]; STABLE-no clinical evidence of recurrence.  # Erythrocytosis-hemoglobin/hematocrit-15.6/46.7 likely secondary; awaiting- JAK2 mutation.    # CAD [s/p stenting; March 2022]- on asprin/plavix;   # Ulcerative colitis-colo-may 2020-STABLE.   # BMD- Sep 2022-FEB T score= -1; normal. Continue ca+vit D- STABLE.   # DISPOSITION: # follow up in 12 monthsMD/labs-cbc/cmp; Dr.B     No orders of the defined types were placed in this encounter.  All questions were answered. The patient knows to call the clinic with any problems, questions or concerns.      Cammie Sickle, MD 05/01/2020 8:16 PM

## 2020-05-01 NOTE — Telephone Encounter (Signed)
Patient notified. LEC procedure and PV cancelled-pt is aware. Recall placed in Epic.

## 2020-05-01 NOTE — Assessment & Plan Note (Addendum)
#   LEFT Breast Stage I s/p mastec [prophy right mastec] on Low risk mammoprint- finished Letrozole [5 years; AUG 2021]; STABLE-no clinical evidence of recurrence.  # Erythrocytosis-hemoglobin/hematocrit-15.6/46.7 likely secondary; awaiting- JAK2 mutation.    # CAD [s/p stenting; March 2022]- on asprin/plavix;   # Ulcerative colitis-colo-may 2020-STABLE.   # BMD- Sep 2022-FEB T score= -1; normal. Continue ca+vit D- STABLE.   # DISPOSITION: # follow up in 12 monthsMD/labs-cbc/cmp; Dr.B

## 2020-05-02 ENCOUNTER — Encounter: Payer: BC Managed Care – PPO | Admitting: Internal Medicine

## 2020-05-02 LAB — ERYTHROPOIETIN: Erythropoietin: 14.8 m[IU]/mL (ref 2.6–18.5)

## 2020-05-04 ENCOUNTER — Ambulatory Visit (INDEPENDENT_AMBULATORY_CARE_PROVIDER_SITE_OTHER): Payer: BC Managed Care – PPO | Admitting: Psychology

## 2020-05-04 DIAGNOSIS — F3131 Bipolar disorder, current episode depressed, mild: Secondary | ICD-10-CM | POA: Diagnosis not present

## 2020-05-05 ENCOUNTER — Other Ambulatory Visit: Payer: Self-pay | Admitting: *Deleted

## 2020-05-05 MED ORDER — LEVOXYL 125 MCG PO TABS: 250.0000 ug | ORAL_TABLET | Freq: Every day | ORAL | 0 refills | Status: DC

## 2020-05-07 LAB — JAK2 GENOTYPR

## 2020-05-08 ENCOUNTER — Telehealth: Payer: Self-pay | Admitting: *Deleted

## 2020-05-08 NOTE — Telephone Encounter (Signed)
Attempted to call pt to discuss results. No answer. Lmtcb.

## 2020-05-08 NOTE — Telephone Encounter (Signed)
-----   Message from Arvil Chaco, PA-C sent at 05/07/2020  9:17 PM EDT ----- Cardiac monitoring showed --Predominantly NSR with minimum rate 72 bpm, maximum 187 bpm, average 96 bpm. --1 brief run of nonsustained ventricular tachycardia -maximum rate 187 bpm. This is a faster rhythm from the bottom part of the heart. --1 run of SVT, which is a faster rhythm from the top part of the heart. Maximum rate 184bpm. --Isolated extra beats from the bottom and top part of the heart. Beats from the bottom part of the heart with frequent at 26.7% or 516,369 beats.  Recommendations: --Please make the pt an appt to come into the office and discuss medication management sooner than before her 09/2020 visit (her current visit scheduled). Given her history of medication intolerances, an office visit may be best versus medication changes over the phone. In addition to control of ectopy, most recent BP of record is 162/105 and is very uncontrolled, and thus she would also benefit from escalation of GDMT for further BP control as well.  We can further review the monitor results at that time.

## 2020-05-09 ENCOUNTER — Other Ambulatory Visit: Payer: Self-pay | Admitting: Endocrinology

## 2020-05-09 NOTE — Telephone Encounter (Signed)
Spoke to pt. Notified of zio montior results and provider's recc.  Appt scheduled per JV for 05/19/20 @ 2:30PM to discuss medication management.  Advised pt to bring BP readings with her to visit.  Pt verbalized understanding and has no further questions.

## 2020-05-10 ENCOUNTER — Telehealth: Payer: Self-pay | Admitting: Endocrinology

## 2020-05-10 ENCOUNTER — Other Ambulatory Visit: Payer: Self-pay | Admitting: *Deleted

## 2020-05-10 MED ORDER — OZEMPIC (1 MG/DOSE) 4 MG/3ML ~~LOC~~ SOPN: 1.0000 mg | PEN_INJECTOR | SUBCUTANEOUS | 5 refills | Status: DC

## 2020-05-11 ENCOUNTER — Other Ambulatory Visit: Payer: Self-pay | Admitting: *Deleted

## 2020-05-11 MED ORDER — REPATHA 140 MG/ML ~~LOC~~ SOSY: PREFILLED_SYRINGE | SUBCUTANEOUS | 3 refills | Status: DC

## 2020-05-15 ENCOUNTER — Ambulatory Visit: Payer: BC Managed Care – PPO | Admitting: Dermatology

## 2020-05-16 ENCOUNTER — Ambulatory Visit: Payer: BC Managed Care – PPO | Admitting: Endocrinology

## 2020-05-18 ENCOUNTER — Encounter: Payer: BC Managed Care – PPO | Admitting: Internal Medicine

## 2020-05-19 ENCOUNTER — Ambulatory Visit: Payer: BC Managed Care – PPO | Admitting: Physician Assistant

## 2020-05-19 ENCOUNTER — Telehealth: Payer: Self-pay | Admitting: Cardiovascular Disease

## 2020-05-19 NOTE — Telephone Encounter (Signed)
Pt c/o medication issue:  1. Name of Medication: new bp pill - olmesartan  2. How are you currently taking this medication (dosage and times per day)?   3. Are you having a reaction (difficulty breathing--STAT)? Lightheaded and not controlling bp  Sit to stand bp 85/54 hr 114 this morning   4. What is your medication issue?  Patient is not sure she is tolerating this med well.    Patient cancelled JV visit due to vomiting and rescheduled with RD .  She requested provider change.

## 2020-05-19 NOTE — Telephone Encounter (Signed)
Attempted to reach out to Mrs. Faulstich, unable to reach pt, LDM on VM (DPR approved), advised of Blanche East, PA-C advise   "Olmesartan was prescribed 02/2020.  She has had several office visits since that time, including one with her primary cardiologist.   It is unlikely that it is related to her Olmesartan, especially since I decreased the dose to 68m back earlier this year.   I would recommend she follow-up with her PCP, depending on her sx.    Please make sure it is not the DAPT (Dual antiplatelet therapy) causing reflux, which was started after her catheterization (occurring about 1 month ago). Looks like she's recently been cathed.   I suspect she may need a PPI (Proton-pump inhibitors) on DAPT, epecially given she has reported nausea for many office visits now"  Advised pt to reach out to her PCP regarding her symptoms and possible need for a PPI, also may need to hydrate with fluids if her BP is low when standing and HR elevated when standing, if continues may need to seek ED over the weekend for cardiac workup and IV fluids.Continue to monitor BP, HR, lightheadedness, and or dizziness.  Keep appt later this month with RChristell Faithfor cardiac and medication evaluation.   Pt may call back for further concerns or questions.

## 2020-05-25 ENCOUNTER — Other Ambulatory Visit: Payer: Self-pay | Admitting: Internal Medicine

## 2020-05-28 ENCOUNTER — Other Ambulatory Visit: Payer: Self-pay | Admitting: Family Medicine

## 2020-05-29 DIAGNOSIS — C50112 Malignant neoplasm of central portion of left female breast: Secondary | ICD-10-CM | POA: Diagnosis not present

## 2020-05-29 DIAGNOSIS — Z9013 Acquired absence of bilateral breasts and nipples: Secondary | ICD-10-CM | POA: Diagnosis not present

## 2020-05-29 DIAGNOSIS — C50111 Malignant neoplasm of central portion of right female breast: Secondary | ICD-10-CM | POA: Diagnosis not present

## 2020-05-30 ENCOUNTER — Ambulatory Visit (INDEPENDENT_AMBULATORY_CARE_PROVIDER_SITE_OTHER): Payer: BC Managed Care – PPO | Admitting: Psychology

## 2020-05-30 DIAGNOSIS — F3131 Bipolar disorder, current episode depressed, mild: Secondary | ICD-10-CM

## 2020-06-05 ENCOUNTER — Ambulatory Visit: Payer: BC Managed Care – PPO | Admitting: Endocrinology

## 2020-06-05 ENCOUNTER — Other Ambulatory Visit (INDEPENDENT_AMBULATORY_CARE_PROVIDER_SITE_OTHER): Payer: BC Managed Care – PPO

## 2020-06-05 ENCOUNTER — Other Ambulatory Visit: Payer: Self-pay

## 2020-06-05 ENCOUNTER — Other Ambulatory Visit: Payer: Self-pay | Admitting: Internal Medicine

## 2020-06-05 DIAGNOSIS — E1165 Type 2 diabetes mellitus with hyperglycemia: Secondary | ICD-10-CM

## 2020-06-05 DIAGNOSIS — E890 Postprocedural hypothyroidism: Secondary | ICD-10-CM | POA: Diagnosis not present

## 2020-06-05 DIAGNOSIS — Z794 Long term (current) use of insulin: Secondary | ICD-10-CM | POA: Diagnosis not present

## 2020-06-05 LAB — COMPREHENSIVE METABOLIC PANEL
ALT: 45 U/L — ABNORMAL HIGH (ref 0–35)
AST: 35 U/L (ref 0–37)
Albumin: 4.6 g/dL (ref 3.5–5.2)
Alkaline Phosphatase: 106 U/L (ref 39–117)
BUN: 23 mg/dL (ref 6–23)
CO2: 29 mEq/L (ref 19–32)
Calcium: 10.4 mg/dL (ref 8.4–10.5)
Chloride: 106 mEq/L (ref 96–112)
Creatinine, Ser: 0.94 mg/dL (ref 0.40–1.20)
GFR: 65.33 mL/min (ref >60.00)
Glucose, Bld: 118 mg/dL — ABNORMAL HIGH (ref 70–99)
Potassium: 4.6 mEq/L (ref 3.5–5.1)
Sodium: 145 mEq/L (ref 135–145)
Total Bilirubin: 0.5 mg/dL (ref 0.2–1.2)
Total Protein: 6.6 g/dL (ref 6.0–8.3)

## 2020-06-05 LAB — TSH: TSH: 3.12 u[IU]/mL (ref 0.35–4.50)

## 2020-06-05 LAB — HEMOGLOBIN A1C: Hgb A1c MFr Bld: 5.9 % (ref 4.6–6.5)

## 2020-06-05 LAB — T4, FREE: Free T4: 0.58 ng/dL — ABNORMAL LOW (ref 0.60–1.60)

## 2020-06-05 NOTE — Progress Notes (Signed)
Cardiology Office Note    Date:  06/06/2020   ID:  Lindsay, Fry 02-24-1960, MRN [id number]  PCP:  Abner Greenspan, MD  Cardiologist:  Ida Rogue, MD  Electrophysiologist:  None   Chief Complaint: Follow-up  History of Present Illness:   Lindsay Fry is a 60 y.o. female with history of CAD with NSTEMI in 08/2014 with recent PCI to [place] in 03/2020, fibromyalgia, frequent PVCs, DM2, ulcerative colitis, Covid infection in 02/2020, HTN, HLD, left-sided breast cancer diagnosed in 2000 status post bilateral mastectomy, obesity, migraine disorder, chronic back pain, GERD, bipolar disorder, and anxiety who presents for  evaluation of hypertension.  She was admitted to the hospital in 08/2014 with a NSTEMI.  Cath at that time showed severe ostial ramus intermedius disease which was medically managed secondary to being a small vessel approximately 2 mm in diameter.  She otherwise had nonobstructive disease.  Echo showed an EF of 60 to 65%, no regional wall motion abnormalities, grade 1 diastolic dysfunction, no significant valvular abnormalities, normal RV systolic function, normal PASP.  Lower extremity ABIs in 09/2014 were normal.  Zio patch from 01/2016 showed a predominant rhythm of sinus with an average heart rate of 76 bpm (range 56 to 133 bpm), first-degree AV block was present, rare PACs, atrial couplets, PVCs, and ventricular triplets.  No significant arrhythmias were noted.  She was seen in 10/2016 with reported dyspnea on exertion and noted to have new anterior T wave changes.  Subsequent nuclear stress test was nonischemic.  She developed gait instability and memory disturbance late 2020 and was evaluated by neurology 01/2019.  MRI of the brain 02/2019 was unrevealing.  Carotid Dopplers normal 08/12/2019.  She was evaluated in late 2021 with dizziness and vertigo with symptoms improving with meclizine.  There was also some concern polypharmacy was contributing.  She was seen in the ED in  02/2020 with bradycardia, dyspnea, and presyncope with associated chest pain.  She was significantly hypokalemic with a potassium of 2.9.  High-sensitivity troponin negative.  She had mistakenly taken an extra olmesartan.  Echo in 03/2020 showed an EF of 50 to 55%, no regional wall motion abnormalities, normal internal LV cavity size, grade 1 diastolic dysfunction, normal RV systolic function, and frequent PVCs were noted.  Zio patch showed a predominant rhythm of sinus with an average heart rate of 96 bpm with a range of 72 to 187 bpm, 1 run of NSVT lasting 5 beats, 1 run of SVT lasting 9 beats, rare PACs, frequent PVCs representing a 26.7% burden.  Coronary CTA in 03/2020 showed a calcium score of 105 which was the 88th percentile, occluded proximal ramus that appeared to be chronic, and 25 to 49% stenosis involving the mid LAD with recommendation to proceed with cardiac cath.  She underwent cardiac cath on 04/06/2020 which showed severe single-vessel CAD with 95% OM2 stenosis and CTO of the ramus intermedius/OM1 with noncritical disease of the LAD/diagonal branch is also being present.  She underwent successful PCI to the proximal OM2.  She was last seen by her primary cardiologist on 04/11/2020 with no changes being made at that time.  She contacted the office earlier this month noting symptoms of lightheadedness with BP of 85/54 upon standing with a heart rate of 114 bpm.  She was concerned this was related to olmesartan.  She was also advised to increase fluid intake and appointment was scheduled for today.  She comes in doing reasonably well from a cardiac perspective.  Since undergoing PCI she has not had any further chest pain.  She is tolerating DAPT without issues.  No dyspnea, palpitations, or further syncope.  BP log from home shows blood pressure has largely been reasonably controlled with an occasional elevated reading or soft reading.  Due to elevated readings that she recently increased her  olmesartan from 5 mg to 10 mg in mid May.  She drinks approximately 4 cups of coffee per day and will sometimes drink some coke zeros.  She does not drink much water throughout the day.  BP in the office today at 96/76, she is asymptomatic.  She did take 10 mg of olmesartan approximately 2 hours prior to her appointment.   Labs independently reviewed: 05/2020 - potassium 4.6, BUN 23, serum creatinine 0.94, albumin 4.6, AST normal, ALT 45, A1c 5.9, TSH normal 04/2020 - Hgb 15.3, PLT 302 02/2020 - TC 120, TG 172, HDL 40, LDL 46  Past Medical History:  Diagnosis Date  . ADD (attention deficit disorder) 11/19/2012  . Allergic rhinitis, cause unspecified   . Anxiety state, unspecified   . Arthritis   . Asymptomatic postmenopausal status (age-related) (natural)   . Atypical hyperplasia of left breast 2000  . Atypical mole 03/16/2018   R paraspinal mid upper back, excision  . Atypical mole 02/17/2018   R spinal upper back, moderate  . Atypical mole 08/11/2017   L sternum, excision  . Atypical mole 05/27/2017   L post neck, moderate  . Benign neoplasm of other and unspecified site of the digestive system   . Bipolar disorder (HCC)   . Breast cancer (HCC)    Left Breast 6mm invasive CA left uiq, dcis left uoq and a lot ADH, ALH in both breasts.  . CAD (coronary artery disease)    a. 08/2014 NSTEMI/Cath: LM nl, LAD 50p, D1/D2 min irregs, LCX nl, OM1 40, LPDA nl, RI 99ost (2.0mm vessel->med Rx), RCA nl, AM 60, nl EF; b. 11/2016 MV: EF 74%, no ischemia (performed 2/2 dyspnea & new inf TWI).  . Carcinoma of left breast (HCC) 06/02/2014  . Cataract   . Chronic diastolic CHF (congestive heart failure) (HCC)    a. 08/2014 Echo: EF 60-65%, Gr 1 DD, nl LA.  . Clotting disorder (HCC)    on blood thiner, Plavix  . Cyst    on Achilles tendon  . Depression   . Diabetes mellitus (HCC)    frank  . Dysuria   . Edema   . Family history of malignant neoplasm of gastrointestinal tract   . Fibromyalgia   .  GERD (gastroesophageal reflux disease)   . Glaucoma    pre glaucoma, 05/14/2018 pt. denies glaucoma  . Headache    migraines  . Hiatal hernia   . History of alcoholism (HCC)   . History of migraines   . History of ovarian cyst   . Hx of adenomatous colonic polyps   . Insomnia, unspecified   . Myocardial infarction (HCC) 09-04-14  . OCD (obsessive compulsive disorder)   . Osteopenia 09/25/2016   Femoral neck T -1.1  9/18  . Other screening mammogram   . Pure hypercholesterolemia   . Rosacea   . Subacute confusional state 11/19/2012  . Tubular adenoma of colon 01/10/11  . Ulcerative colitis, unspecified   . Unspecified asthma(493.90)   . Unspecified essential hypertension   . Unspecified hypothyroidism   . Unspecified vitamin D deficiency   . Vertigo     Past Surgical History:  Procedure Laterality   Date  . APPENDECTOMY    . AXILLARY SENTINEL NODE BIOPSY Left 05/23/2014   Procedure: AXILLARY SENTINEL NODE BIOPSY;  Surgeon: Seeplaputhur G Sankar, MD;  Location: [facility] OR[facility];  Service: General;  Laterality: Left;  . BREAST BIOPSY  Aug 2000   on tamoxifen, atypical hyperplasia  . BREAST BIOPSY Left 04/2014  . BREAST SURGERY Left Aug 2000   lumpectomy/ Dr Crawford  . BREAST SURGERY Bilateral 05/23/14   Mastectomy  . BUNIONECTOMY Right   . CARDIAC CATHETERIZATION N/A 09/05/2014   Procedure: Left Heart Cath and Coronary Angiography;  Surgeon: Muhammad A Arida, MD;  Location: [facility];  Service: Cardiovascular;  Laterality: N/A;  . CARDIAC CATHETERIZATION  09-05-14  . CATARACT EXTRACTION W/ INTRAOCULAR LENS IMPLANT Bilateral   . CESAREAN SECTION  1996/1997   placenta previa, gest DM, pre-eclampsia  . CHOLECYSTECTOMY  1997   adhesions also  . COLONOSCOPY  01/2001   Ulcerative colitis  . COLONOSCOPY  04/2004   UC, polyp  . COLONOSCOPY  12/08   UC, no polyps  . CORONARY STENT INTERVENTION N/A 04/06/2020   Procedure: CORONARY STENT INTERVENTION;  Surgeon: End, Christopher, MD;   Location: [facility];  Service: Cardiovascular;  Laterality: N/A;  . DEXA  04/1999 and 2010   normal  . ESOPHAGOGASTRODUODENOSCOPY  09/2001   polyp  . exercise stress test  11/2003   negative  . FEMUR FRACTURE SURGERY Left   . LEFT HEART CATH AND CORONARY ANGIOGRAPHY N/A 04/06/2020   Procedure: LEFT HEART CATH AND CORONARY ANGIOGRAPHY;  Surgeon: Gollan, Timothy J, MD;  Location: [facility];  Service: Cardiovascular;  Laterality: N/A;  . MASTECTOMY Bilateral   . MECKEL DIVERTICULUM EXCISION  1999  . NASAL SINUS SURGERY  05/1997  . nuclear stress test  06/2007   negative  . precancerous mole removed    . SIMPLE MASTECTOMY WITH AXILLARY SENTINEL NODE BIOPSY Bilateral 05/23/2014   Procedure: Bilateral simple mastectomy, left sentinel node biopsy ;  Surgeon: Seeplaputhur G Sankar, MD;  Location: [facility];  Service: General;  Laterality: Bilateral;  . Sleep study  11/09   no apnea, but did snore (done by [facility])  . TONSILLECTOMY  1984  . TUBAL LIGATION    . WISDOM TOOTH EXTRACTION  1980's    Current Medications: Current Meds  Medication Sig  . acetaminophen (TYLENOL) 650 MG CR tablet Take 1,300 mg by mouth 2 (two) times daily.  . amantadine (SYMMETREL) 100 MG capsule Take 100 mg by mouth 2 (two) times daily.  . Ascorbic Acid (VITAMIN C PO) Take 1,000 mg by mouth daily.  . aspirin 81 MG chewable tablet Chew 1 tablet (81 mg total) by mouth daily.  . atorvastatin (LIPITOR) 80 MG tablet Take one pill daily  . B Complex Vitamins (VITAMIN B COMPLEX PO) Take 1 tablet by mouth every other day.  . BAYER MICROLET LANCETS lancets USE AS DIRECTED TO CHECK BLOOD SUGAR TWICE DAILY  . Blood Glucose Monitoring Suppl (CONTOUR NEXT MONITOR) w/Device KIT 1 each by Does not apply route in the morning and at bedtime. Use Contour Next meter to check blood sugar twice daily.  . Calcium Carb-Cholecalciferol (CALTRATE 600+D3 PO) Take 1 tablet by mouth daily. 300 mg Vitamin D3  . cholecalciferol  (VITAMIN D3) 25 MCG (1000 UNIT) tablet Take 1,000 Units by mouth daily.  . clopidogrel (PLAVIX) 75 MG tablet TAKE 1 TABLET(75 MG) BY MOUTH DAILY  . Co-Enzyme Q-10 100 MG CAPS Take 100 mg   by mouth daily.  . diazepam (VALIUM) 10 MG tablet Take 5-10 mg by mouth 2 (two) times daily as needed for anxiety.  . diphenhydrAMINE (BENADRYL) 25 MG tablet Take 25-50 mg by mouth at bedtime.  . docusate sodium (COLACE) 100 MG capsule Take 100-300 mg by mouth daily as needed for mild constipation or moderate constipation.  . ezetimibe (ZETIA) 10 MG tablet TAKE 1 TABLET(10 MG) BY MOUTH DAILY  . famotidine (PEPCID) 40 MG tablet Take 40 mg by mouth 2 (two) times daily. 30 min. Before meal and at bedtime  . gabapentin (NEURONTIN) 300 MG capsule TAKE 1 CAPSULE(300 MG) BY MOUTH THREE TIMES DAILY  . glucose blood (CONTOUR NEXT TEST) test strip Use as instructed  . insulin detemir (LEVEMIR FLEXTOUCH) 100 UNIT/ML FlexPen Inject 60 Units into the skin every evening.  . Insulin Pen Needle (B-D UF III MINI PEN NEEDLES) 31G X 5 MM MISC USE FOR INJECTIONS TWICE DAILY  . JARDIANCE 25 MG TABS tablet TAKE 1 TABLET(25 MG) BY MOUTH DAILY (Patient taking differently: Take 25 mg by mouth daily.)  . ketoconazole (NIZORAL) 2 % cream Apply to affected areas one to two times daily as needed.  . lamoTRIgine (LAMICTAL) 200 MG tablet Take 400 mg by mouth daily.  . latanoprost (XALATAN) 0.005 % ophthalmic solution Place 1 drop into both eyes 2 (two) times a week.   . LEVOXYL 125 MCG tablet Take 2 tablets (250 mcg total) by mouth daily before breakfast.  . linaclotide (LINZESS) 290 MCG CAPS capsule Take 290 mcg by mouth daily as needed (Constipation).  . loperamide (IMODIUM A-D) 2 MG tablet Take 2 mg by mouth daily as needed for diarrhea or loose stools.  . Melatonin 10 MG CAPS Take 10 mg by mouth at bedtime.  . mesalamine (LIALDA) 1.2 g EC tablet TAKE 2 TABLETS BY MOUTH DAILY  . metoprolol succinate (TOPROL XL) 25 MG 24 hr tablet Take 0.5  tablets (12.5 mg total) by mouth as needed (As needed at bedtime for systolic blood pressure greater than 100).  . Multiple Vitamin (MULTIVITAMIN) tablet Take 1 tablet by mouth daily. Woman 50 plus  . nitroGLYCERIN (NITROSTAT) 0.4 MG SL tablet Place 1 tablet (0.4 mg total) under the tongue every 5 (five) minutes as needed for chest pain.  . olmesartan (BENICAR) 5 MG tablet Take 1 tablet (5 mg total) by mouth daily.  . ondansetron (ZOFRAN-ODT) 4 MG disintegrating tablet DISSOLVE 1 TABLET(4 MG) ON THE TONGUE EVERY 6 HOURS AS NEEDED FOR NAUSEA OR VOMITING  . pantoprazole (PROTONIX) 40 MG tablet TAKE 1 TABLET(40 MG) BY MOUTH TWICE DAILY  . polyethylene glycol (MIRALAX / GLYCOLAX) 17 g packet Take 17 g by mouth 3 (three) times daily as needed for moderate constipation, mild constipation or severe constipation. In water or beverage  . Probiotic Product (CULTURELLE PROBIOTICS PO) Take 1 capsule by mouth daily. For woman  . QUEtiapine (SEROQUEL XR) 400 MG 24 hr tablet Take 800 mg by mouth at bedtime.  . Semaglutide, 1 MG/DOSE, (OZEMPIC, 1 MG/DOSE,) 4 MG/3ML SOPN Inject 1 mg as directed every Monday.  . simethicone (MYLICON) 80 MG chewable tablet Chew 80 mg by mouth 4 (four) times daily.  . triamcinolone (KENALOG) 0.1 % Apply 1 application topically 2 (two) times daily.  . valACYclovir (VALTREX) 1000 MG tablet TAKE 1 TABLET(1000 MG) BY MOUTH DAILY  . VRAYLAR capsule Take 3 mg by mouth at bedtime.  . VYVANSE 50 MG capsule Take 50 mg by mouth every morning.  .   zaleplon (SONATA) 10 MG capsule Take 10-20 mg by mouth at bedtime.  . [DISCONTINUED] olmesartan (BENICAR) 5 MG tablet Take 10 mg by mouth daily.    Allergies:   Ephedrine, Aspirin, Crestor [rosuvastatin], Erythromycin, Nsaids, and Oxycodone   Social History   Socioeconomic History  . Marital status: Married    Spouse name: alan  . Number of children: 2  . Years of education: college  . Highest education level: Not on file  Occupational  History  . Occupation: unemployed  Tobacco Use  . Smoking status: Former Smoker    Packs/day: 0.25    Years: 5.00    Pack years: 1.25    Types: Cigarettes    Quit date: 01/14/1993    Years since quitting: 27.4  . Smokeless tobacco: Never Used  Vaping Use  . Vaping Use: Never used  Substance and Sexual Activity  . Alcohol use: No    Alcohol/week: 0.0 standard drinks    Comment: Recovered ETOH  . Drug use: No  . Sexual activity: Not Currently    Partners: Male  Other Topics Concern  . 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      Works-doing telephone triage   Social Determinants of Health   Financial Resource Strain: Not on file  Food Insecurity: Not on file  Transportation Needs: Not on file  Physical Activity: Not on file  Stress: Not on file  Social Connections: Not on file     Family History:  The patient's family history includes Alzheimer's disease in her father; Breast cancer in an other family member; Colon cancer in her father; Colon polyps in her father; Coronary artery disease in her father, mother, and another family member; Crohn's disease in her mother; Dementia in her father; Diabetes in an other family member; Hypertension in her mother; Osteoporosis in her mother; Stroke in her cousin. There is no history of Esophageal cancer, Rectal cancer, or Stomach cancer.  ROS:   Review of Systems  Constitutional: Positive for malaise/fatigue. Negative for chills, diaphoresis, fever and weight loss.  HENT: Negative for congestion.   Eyes: Negative for discharge and redness.  Respiratory: Negative for cough, sputum production, shortness of breath and wheezing.   Cardiovascular: Negative for chest pain, palpitations, orthopnea, claudication, leg swelling and PND.  Gastrointestinal: Negative for abdominal pain, blood in stool, heartburn, melena, nausea and vomiting.   Musculoskeletal: Negative for falls and myalgias.  Skin: Negative for rash.  Neurological: Positive for dizziness and weakness. Negative for tingling, tremors, sensory change, speech change, focal weakness and loss of consciousness.  Endo/Heme/Allergies: Does not bruise/bleed easily.  Psychiatric/Behavioral: Negative for substance abuse. The patient is not nervous/anxious.   All other systems reviewed and are negative.    EKGs/Labs/Other Studies Reviewed:    Studies reviewed were summarized above. The additional studies were reviewed today:  Zio patch 03/2020:Sinus rhythm min HR of 72 bpm, max HR of 187 bpm, and avg HR of 96 bpm.   1 run of Ventricular Tachycardia occurred lasting 5 beats with a max rate of 187 bpm (avg 131 bpm).   1 run of Supraventricular Tachycardia occurred lasting 9 beats with a max rate of 184 bpm (avg 162 bpm).  Isolated SVEs were rare (<1.0%), and no SVE Couplets or SVE Triplets were present.  Isolated VEs were frequent (26.7%, [id number]),   VE Couplets were rare (<1.0%, 1250), and VE Triplets were rare (<1.0%, 38).  Ventricular Bigeminy and Trigeminy were present. __________  LHC 04/06/2020: Conclusions: 1. Severe single-vessel coronary artery disease with 95% OM 2 stenosis and chronic total occlusion of ramus intermedius/OM1, as detailed in Dr. Gollan's diagnostic catheterization note.  Noncritical disease of the LAD/diagonal branches is also present. 2. Successful PCI to proximal OM2 using Resolute Onyx 2.75 x 12 mm drug-eluting stent with 0% residual stenosis and TIMI-3 flow.  Conclusions: 1. Favor dual antiplatelet therapy with aspirin and clopidogrel for up to 12 months.  Given questionable intolerance of aspirin I would favor trying for at least 1 month of DAPT before switching to clopidogrel alone. 2. Obtain cytochrome P450 2C19 genotype to ensure appropriate response to clopidogrel. 3. Aggressive secondary prevention. 4. Overnight observation with  plans for discharge home tomorrow if no post catheterization complications. __________  Coronary CTA 03/30/2020: Aorta: Normal size. Minimal aortic root wall calcifications. No dissection.  Aortic Valve:  Trileaflet.  No calcifications.  Coronary Arteries:  Normal coronary origin.  left dominance.  RCA is a non dominant artery, there is no plaque.  Left main is a large artery that gives rise to LAD, ramus and LCX arteries.  The Ramus artery appears to be chronically occluded  LAD is has calcified plaque in the proximal to mid segment causing mild 25-49% stenosis.  LCX is a dominant artery with mild calcifications in the mid vessel causing minimal stenosis (<25%), at the bifurcation into OM1 branch. It continues to supply the PDA.  Other findings:  Normal pulmonary vein drainage into the left atrium.  Normal left atrial appendage without a thrombus.  Normal size of the pulmonary artery.  IMPRESSION: 1. Coronary calcium score of 105. This was 88th percentile for age and sex matched control. 2. Normal coronary origin with left dominance. 3. Mild stenosis in the mid LAD (25-49%) 4. Total occlusion (100%) of Ramus branch proximally, appears chronic. 5. CAD-RADS 5 Total coronary occlusion (100%). Consider cardiac catheterization or viability assessment. Consider symptom-guided anti-ischemic pharmacotherapy as well as risk factor modification per guideline directed care. 6. Study quality degraded by motion artifacts __________  2D echo 03/16/2020: 1. Left ventricular ejection fraction, by estimation, is 50 to 55%. The  left ventricle has low normal function. The left ventricle has no regional  wall motion abnormalities. Left ventricular diastolic parameters are  consistent with Grade I diastolic  dysfunction (impaired relaxation).  2. Right ventricular systolic function is normal. The right ventricular  size is normal.  3. Frequent PVCs  __________  Event  monitor 01/2016: Normal sinus rhythm Min HR of 56 bpm, max HR of 133 bpm, and avg HR of 76 bpm.   First Degree AV Block was present. Isolated SVEs were rare (<1.0%), SVE Couplets were rare (<1.0%), and no SVE Triplets were present.  Isolated VEs were rare (<1.0%, 766), VE Triplets were rare (<1.0%, 1), and no VE Couplets were present. __________  2D echo 09/05/2014: - Left ventricle: The cavity size was normal. Systolic function was  normal. The estimated ejection fraction was in the range of 60%  to 65%. Wall motion was normal; there were no regional wall  motion abnormalities. Doppler parameters are consistent with  abnormal left ventricular relaxation (grade 1 diastolic  dysfunction).  - Left atrium: The atrium was normal in size.  - Right ventricle: Systolic function was normal.  - Pulmonary arteries: Systolic pressure was within the normal  range.   Impressions:   -   Normal study. ___________  LHC 09/05/2014:  Acute Mrg lesion, 60% stenosed.  Ost Ramus to Ramus lesion, 99% stenosed.  Prox LAD lesion, 50% stenosed.  1st Mrg lesion, 40% stenosed.   1. Severe one-vessel coronary artery disease. The culprit for myocardial infarction this subtotal occlusion of the ostial ramus. This vessel is medium size and the diameter is about 2 mm. 2. Normal LV systolic function and mildly elevated left ventricular end-diastolic pressure.  Recommendations: The ostial ramus stenosis is in a location where placing a stent might compromise the flow to the LAD or left circumflex. Doing balloon angioplasty alone would probably not have good long-term results given that the diameter is about 2 mm. Thus, I think the best option is medical therapy for now. I increased the dose of atorvastatin and placed the patient on Brilinta to be taken for one year. Recommend aggressive control of risk factors and blood pressure control. __________  Nuclear stress test 09/07/2012: No significant  ischemia, EF 58%, low risk scan  EKG:  EKG is ordered today.  The EKG ordered today demonstrates sinus tachycardia, 112 bpm, occasional PVCs, poor R wave progression along the precordial leads, nonspecific ST-T changes  Recent Labs: 05/01/2020: Hemoglobin 15.3; Platelets 302 06/05/2020: ALT 45; BUN 23; Creatinine, Ser 0.94; Potassium 4.6; Sodium 145; TSH 3.12  Recent Lipid Panel    Component Value Date/Time   CHOL 120 03/07/2020 0937   TRIG 172.0 (H) 03/07/2020 0937   HDL 40.30 03/07/2020 0937   CHOLHDL 3 03/07/2020 0937   VLDL 34.4 03/07/2020 0937   LDLCALC 46 03/07/2020 0937   LDLDIRECT 125.0 02/07/2020 1134    PHYSICAL EXAM:    VS:  BP 96/76 (BP Location: Right Arm, Patient Position: Sitting, Cuff Size: Large)   Pulse (!) 112   Ht 5' 6.5" (1.689 m)   Wt 184 lb (83.5 kg)   LMP 08/23/2006 (Approximate) Comment: tubal ligation  SpO2 97%   BMI 29.25 kg/m   BMI: Body mass index is 29.25 kg/m.  Physical Exam Vitals reviewed.  Constitutional:      Appearance: She is well-developed.  HENT:     Head: Normocephalic and atraumatic.  Eyes:     General:        Right eye: No discharge.        Left eye: No discharge.  Neck:     Vascular: No JVD.  Cardiovascular:     Rate and Rhythm: Normal rate and regular rhythm. Occasional extrasystoles are present.    Pulses: No midsystolic click and no opening snap.     Heart sounds: Normal heart sounds, S1 normal and S2 normal. Heart sounds not distant. No murmur heard. No friction rub.  Pulmonary:     Effort: Pulmonary effort is normal. No respiratory distress.     Breath sounds: Normal breath sounds. No decreased breath sounds, wheezing or rales.  Chest:     Chest wall: No tenderness.  Abdominal:     General: There is no distension.     Palpations: Abdomen is soft.     Tenderness: There is no abdominal tenderness.  Musculoskeletal:     Cervical back: Normal range of motion.  Skin:    General: Skin is warm and dry.     Nails:  There is no clubbing.  Neurological:     Mental Status: She is alert and oriented to person, place, and time.  Psychiatric:        Speech: Speech normal.          Behavior: Behavior normal.        Thought Content: Thought content normal.        Judgment: Judgment normal.     Wt Readings from Last 3 Encounters:  06/06/20 184 lb (83.5 kg)  05/01/20 188 lb (85.3 kg)  04/12/20 191 lb 4 oz (86.8 kg)     ASSESSMENT & PLAN:   1. CAD involving the native coronaries without angina: She is doing well without symptoms concerning for angina.  Continue DAPT with ASA and clopidogrel, preferably without interruption for up to 12 months.  She will otherwise continue secondary prevention with atorvastatin and newly added metoprolol with continuation of lower dose of olmesartan as outlined below.  No indication for further ischemic testing at this time.  2. Frequent PVCs: Recent outpatient cardiac monitoring demonstrated 26.7% PVC burden.  With this she underwent ischemic testing that ultimately led to PCI as outlined above.  Recent labs have demonstrated normal thyroid function and potassium at goal.  She does continue to have occasional PVCs noted on twelve-lead EKG.  Check magnesium.  Start Toprol-XL 12.5 mg nightly with hold parameters for systolic blood pressure less than 100 mmHg.  We will see her back in 2 weeks time to assess her tolerance/BP on metoprolol.  If we are unable to escalate beta-blockade secondary to tolerance or blood pressure issues we may need to consider antiarrhythmic therapy or EP referral for consideration of PVC ablation.  If she is able to tolerate metoprolol, once we have her on maximally tolerated dose, I will plan to repeat a 3-day Zio patch to quantify PVC burden on medical therapy.  Minimize caffeine intake.  3. HTN: Blood pressure overall has been reasonably controlled at home with occasional high and low readings.  She typically notes these fluctuations follow her bipolar  trends with elevated BP during manic episodes and lower BPs during manic episodes.  BP is soft today in the office at 96/76, though she is asymptomatic and has taken olmesartan 10 mg approximately 2 hours prior to this appointment.  Given significant PVC burden, she has been started on Toprol-XL 12.5 mg nightly (to be taken if systolic blood pressure is greater than 100 mmHg) in an effort to suppress PVCs.  With this, her olmesartan was decreased to 5 mg daily in an effort to allow for adequate BP room to start a beta-blocker.  Minimize caffeine intake and increase water intake.  4. HLD: LDL 46 in 02/2020.  Continue atorvastatin 80 mg daily.  She has not started Repatha.  Given well-controlled LDL with lifestyle modification and atorvastatin, in an effort to not lower her LDL too much I do not suspect that she currently needs Repatha.   Disposition: F/u with Dr. Rockey Situ or an APP in 2 weeks.   Medication Adjustments/Labs and Tests Ordered: Current medicines are reviewed at length with the patient today.  Concerns regarding medicines are outlined above. Medication changes, Labs and Tests ordered today are summarized above and listed in the Patient Instructions accessible in Encounters.   Signed, Christell Faith, PA-C 06/06/2020 12:53 PM     [place] [address] [place], [place] [id number] [phone]

## 2020-06-06 ENCOUNTER — Ambulatory Visit (INDEPENDENT_AMBULATORY_CARE_PROVIDER_SITE_OTHER): Payer: BC Managed Care – PPO | Admitting: Physician Assistant

## 2020-06-06 ENCOUNTER — Encounter: Payer: Self-pay | Admitting: Physician Assistant

## 2020-06-06 ENCOUNTER — Other Ambulatory Visit: Payer: Self-pay

## 2020-06-06 VITALS — BP 96/76 | HR 112 | Ht 66.5 in | Wt 184.0 lb

## 2020-06-06 DIAGNOSIS — I10 Essential (primary) hypertension: Secondary | ICD-10-CM | POA: Diagnosis not present

## 2020-06-06 DIAGNOSIS — I25119 Atherosclerotic heart disease of native coronary artery with unspecified angina pectoris: Secondary | ICD-10-CM | POA: Diagnosis not present

## 2020-06-06 DIAGNOSIS — I493 Ventricular premature depolarization: Secondary | ICD-10-CM

## 2020-06-06 DIAGNOSIS — I2510 Atherosclerotic heart disease of native coronary artery without angina pectoris: Secondary | ICD-10-CM | POA: Diagnosis not present

## 2020-06-06 DIAGNOSIS — R002 Palpitations: Secondary | ICD-10-CM | POA: Diagnosis not present

## 2020-06-06 DIAGNOSIS — E785 Hyperlipidemia, unspecified: Secondary | ICD-10-CM

## 2020-06-06 MED ORDER — METOPROLOL SUCCINATE ER 25 MG PO TB24
12.5000 mg | ORAL_TABLET | ORAL | 1 refills | Status: DC | PRN
Start: 2020-06-06 — End: 2020-10-02

## 2020-06-06 MED ORDER — OLMESARTAN MEDOXOMIL 5 MG PO TABS: 5.0000 mg | ORAL_TABLET | Freq: Every day | ORAL | 3 refills | Status: DC

## 2020-06-06 NOTE — Patient Instructions (Addendum)
Medication Instructions:  Your physician has recommended you make the following change in your medication:   1. DECREASE Olmesartan 5 mg once daily 2. START Toprol (Metoprolol tartrate) 25 mg take half a tablet (12.5 mg) as needed at bedtime for systolic blood pressure greater than 100 3. STOP Repatha  *If you need a refill on your cardiac medications before your next appointment, please call your pharmacy*   Lab Work: Mag level today  If you have labs (blood work) drawn today and your tests are completely normal, you will receive your results only by: Marland Kitchen MyChart Message (if you have MyChart) OR . A paper copy in the mail If you have any lab test that is abnormal or we need to change your treatment, we will call you to review the results.   Testing/Procedures: None   Follow-Up: At [facility], you and your health needs are our priority.  As part of our continuing mission to provide you with exceptional heart care, we have created designated Provider Care Teams.  These Care Teams include your primary Cardiologist (physician) and Advanced Practice Providers (APPs -  Physician Assistants and Nurse Practitioners) who all work together to provide you with the care you need, when you need it.   Your next appointment:   2 week(s)  The format for your next appointment:   In Person  Provider:   You may see Ida Rogue, MD or one of the following Advanced Practice Providers on your designated Care Team:    Murray Hodgkins, NP  Christell Faith, PA-C  Marrianne Mood, PA-C  Cadence Minorca, [place]  Laurann Montana, NP

## 2020-06-07 LAB — MAGNESIUM: Magnesium: 2.4 mg/dL — ABNORMAL HIGH (ref 1.6–2.3)

## 2020-06-08 ENCOUNTER — Telehealth: Payer: Self-pay | Admitting: *Deleted

## 2020-06-08 NOTE — Telephone Encounter (Signed)
-----   Message from Rise Mu, PA-C sent at 06/08/2020  8:00 AM EDT ----- Magnesium is at goal No changes

## 2020-06-08 NOTE — Telephone Encounter (Signed)
Left voicemail message to call back for review of results.  

## 2020-06-09 NOTE — Telephone Encounter (Signed)
Left detailed voicemail message per release form that magnesium is at goal with no changes and to call back if any questions.

## 2020-06-09 NOTE — Telephone Encounter (Signed)
Left voicemail message with results and to call back if questions.

## 2020-06-14 ENCOUNTER — Encounter: Payer: Self-pay | Admitting: Endocrinology

## 2020-06-14 ENCOUNTER — Ambulatory Visit: Payer: BC Managed Care – PPO | Admitting: Endocrinology

## 2020-06-14 ENCOUNTER — Other Ambulatory Visit: Payer: Self-pay

## 2020-06-14 VITALS — BP 118/76 | HR 80 | Ht 66.5 in | Wt 187.2 lb

## 2020-06-14 DIAGNOSIS — E890 Postprocedural hypothyroidism: Secondary | ICD-10-CM

## 2020-06-14 DIAGNOSIS — E669 Obesity, unspecified: Secondary | ICD-10-CM

## 2020-06-14 DIAGNOSIS — E782 Mixed hyperlipidemia: Secondary | ICD-10-CM

## 2020-06-14 DIAGNOSIS — E1169 Type 2 diabetes mellitus with other specified complication: Secondary | ICD-10-CM

## 2020-06-14 NOTE — Progress Notes (Signed)
Patient ID: Lindsay Fry, female   DOB: 08-May-1960, 60 y.o.   MRN: [id number]   Reason for Appointment:   followup visit  History of Present Illness:  DIABETES:  Prior history: With her high A1c of 6.7 in 03/2014 she had an abnormal glucose tolerance test indicating diabetes and 1 hour glucose of 300 She was started on metformin; however even with 1500 mg of metformin ER  blood sugars were not controlled especially fasting Subsequently with taking Janumet XR 100/1000 daily her blood sugars were much better Because of her weight gain and A1c going up to 7.3 along with occasional readings over 200 she was started on Victoza in 11/16 She has had progressive hyperglycemia previously and A1c was up to 8.6% in 4/17  She was started on Levemir insulin in 08/2015 because of progressive rise in blood sugars and inability to control with 3 other agents   Non-insulin hypoglycemic drugs: Ozempic 1 mg weekly; metformin ER 2000 mg daily, Jardiance 25 mg daily  INSULIN regimen: Levemir 60 units at bedtime  A1c is 5.9 compared to 6.7 before  Current management, blood sugar patterns and problems identified:  She believes she has done better with her diet and has lost some weight overall  Recently also trying to be more active  Previously high sugars would be related to eating sweets like donuts and drinking regular Sprite  She again checks blood sugars primarily before her first meal which can be at variable times and the highest recently is 165  Rarely blood sugars are checked after meals but they are not appearing to be high  Lab fasting glucose 118 recently  Has not missed any doses of Ozempic, no nausea with this  GLUCOSE readings from review of the CONTOUR monitor review  FASTING range 97-165, nonfasting 95-117 2-week average 117  Previously  PRE-MEAL Fasting Lunch Dinner Bedtime Overall  Glucose range:  109-171      Mean/median: 136   128  135   POST-MEAL PC  Breakfast PC Lunch PC Dinner  Glucose range:   127   Mean/median:       Weight history:  Wt Readings from Last 3 Encounters:  06/14/20 187 lb 3.2 oz (84.9 kg)  06/06/20 184 lb (83.5 kg)  05/01/20 188 lb (85.3 kg)   Previous consultation with dietitian: 2016    LABS:  Lab Results  Component Value Date   HGBA1C 5.9 06/05/2020   HGBA1C 6.7 (H) 03/07/2020   HGBA1C 7.3 (H) 12/13/2019   Lab Results  Component Value Date   MICROALBUR <0.7 09/06/2019   [place] 46 03/07/2020   CREATININE 0.94 06/05/2020   HYPOTHYROIDISM: This was first diagnosed in 1992 after her treatment for Graves' disease with I-131 She has been on relatively large doses of thyroxine supplements She was on 224 mcg since 11/13 and her TSH was normal in 3/14 Subjectively difficult to assess her thyroid since she tends to have fatigue chronically.  In 2014 her dose was reduced to 200 mcg daily and her dose has been fluctuating since then She had required periodic increase in dosage including in 05/2014 when her TSH was 20  She is on 250 g dosage, continues on the brand name LEVOXYL  She feels fairly good generally  She has been regular with the Levoxyl every day before breakfast on empty stomach  Levoxyl is better covered than Synthroid by insurance         TSH as follows  Lab Results  Component  Value Date   TSH 3.12 06/05/2020   TSH 2.87 03/07/2020   TSH 1.705 03/02/2020   FREET4 0.58 (L) 06/05/2020   FREET4 1.01 03/07/2020   FREET4 0.44 (L) 12/13/2019     Allergies as of 06/14/2020      Reactions   Ephedrine Other (See Comments)   Pt becomes hyper   Aspirin    REACTION: aggrivates colitis   Crestor [rosuvastatin]    REACTION: increased lfts   Erythromycin    REACTION: GI upset   Nsaids    REACTION: aggrivate colitis   Oxycodone Other (See Comments)   Panic Attack      Medication List       Accurate as of June 14, 2020 10:21 AM. If you have any questions, ask your nurse or doctor.         STOP taking these medications   triamcinolone cream 0.1 % Commonly known as: KENALOG Stopped by: Elayne Snare, MD     TAKE these medications   acetaminophen 650 MG CR tablet Commonly known as: TYLENOL Take 1,300 mg by mouth 2 (two) times daily.   amantadine 100 MG capsule Commonly known as: SYMMETREL Take 100 mg by mouth 2 (two) times daily.   aspirin 81 MG chewable tablet Chew 1 tablet (81 mg total) by mouth daily.   atorvastatin 80 MG tablet Commonly known as: LIPITOR Take one pill daily   B-D UF III MINI PEN NEEDLES 31G X 5 MM Misc Generic drug: Insulin Pen Needle USE FOR INJECTIONS TWICE DAILY   Bayer Microlet Lancets lancets USE AS DIRECTED TO CHECK BLOOD SUGAR TWICE DAILY   CALTRATE 600+D3 PO Take 1 tablet by mouth daily. 300 mg Vitamin D3   cholecalciferol 25 MCG (1000 UNIT) tablet Commonly known as: VITAMIN D3 Take 1,000 Units by mouth daily.   clopidogrel 75 MG tablet Commonly known as: PLAVIX TAKE 1 TABLET(75 MG) BY MOUTH DAILY   Co-Enzyme Q-10 100 MG Caps Take 100 mg by mouth daily.   Contour Next Monitor w/Device Kit 1 each by Does not apply route in the morning and at bedtime. Use Contour Next meter to check blood sugar twice daily.   Contour Next Test test strip Generic drug: glucose blood Use as instructed   CULTURELLE PROBIOTICS PO Take 1 capsule by mouth daily. For woman   diazepam 10 MG tablet Commonly known as: VALIUM Take 5-10 mg by mouth 2 (two) times daily as needed for anxiety.   diphenhydrAMINE 25 MG tablet Commonly known as: BENADRYL Take 25-50 mg by mouth at bedtime.   docusate sodium 100 MG capsule Commonly known as: COLACE Take 100-300 mg by mouth daily as needed for mild constipation or moderate constipation.   ezetimibe 10 MG tablet Commonly known as: ZETIA TAKE 1 TABLET(10 MG) BY MOUTH DAILY   famotidine 40 MG tablet Commonly known as: PEPCID Take 40 mg by mouth 2 (two) times daily. 30 min. Before meal and at  bedtime   gabapentin 300 MG capsule Commonly known as: NEURONTIN TAKE 1 CAPSULE(300 MG) BY MOUTH THREE TIMES DAILY   Jardiance 25 MG Tabs tablet Generic drug: empagliflozin TAKE 1 TABLET(25 MG) BY MOUTH DAILY What changed: See the new instructions.   ketoconazole 2 % cream Commonly known as: NIZORAL Apply to affected areas one to two times daily as needed.   lamoTRIgine 200 MG tablet Commonly known as: LAMICTAL Take 400 mg by mouth daily.   latanoprost 0.005 % ophthalmic solution Commonly known as: XALATAN Place 1  drop into both eyes 2 (two) times a week.   Levemir FlexTouch 100 UNIT/ML FlexPen Generic drug: insulin detemir Inject 60 Units into the skin every evening.   Levoxyl 125 MCG tablet Generic drug: levothyroxine Take 2 tablets (250 mcg total) by mouth daily before breakfast.   Linzess 290 MCG Caps capsule Generic drug: linaclotide TAKE 1 CAPSULE(290 MCG) BY MOUTH DAILY BEFORE BREAKFAST   loperamide 2 MG tablet Commonly known as: IMODIUM A-D Take 2 mg by mouth daily as needed for diarrhea or loose stools.   Melatonin 10 MG Caps Take 10 mg by mouth at bedtime.   mesalamine 1.2 g EC tablet Commonly known as: LIALDA TAKE 2 TABLETS BY MOUTH DAILY   metoprolol succinate 25 MG 24 hr tablet Commonly known as: Toprol XL Take 0.5 tablets (12.5 mg total) by mouth as needed (As needed at bedtime for systolic blood pressure greater than 100).   multivitamin tablet Take 1 tablet by mouth daily. Woman 50 plus   nitroGLYCERIN 0.4 MG SL tablet Commonly known as: NITROSTAT Place 1 tablet (0.4 mg total) under the tongue every 5 (five) minutes as needed for chest pain.   olmesartan 5 MG tablet Commonly known as: BENICAR Take 1 tablet (5 mg total) by mouth daily.   ondansetron 4 MG disintegrating tablet Commonly known as: ZOFRAN-ODT DISSOLVE 1 TABLET(4 MG) ON THE TONGUE EVERY 6 HOURS AS NEEDED FOR NAUSEA OR VOMITING   Ozempic (1 MG/DOSE) 4 MG/3ML Sopn Generic  drug: Semaglutide (1 MG/DOSE) Inject 1 mg as directed every Monday.   pantoprazole 40 MG tablet Commonly known as: PROTONIX TAKE 1 TABLET(40 MG) BY MOUTH TWICE DAILY   polyethylene glycol 17 g packet Commonly known as: MIRALAX / GLYCOLAX Take 17 g by mouth 3 (three) times daily as needed for moderate constipation, mild constipation or severe constipation. In water or beverage   QUEtiapine 400 MG 24 hr tablet Commonly known as: SEROQUEL XR Take 800 mg by mouth at bedtime.   simethicone 80 MG chewable tablet Commonly known as: MYLICON Chew 80 mg by mouth 4 (four) times daily.   valACYclovir 1000 MG tablet Commonly known as: VALTREX TAKE 1 TABLET(1000 MG) BY MOUTH DAILY   VITAMIN B COMPLEX PO Take 1 tablet by mouth every other day.   VITAMIN C PO Take 1,000 mg by mouth daily.   Vraylar 3 MG capsule Generic drug: cariprazine Take 3 mg by mouth at bedtime.   Vyvanse 50 MG capsule Generic drug: lisdexamfetamine Take 50 mg by mouth every morning.   zaleplon 10 MG capsule Commonly known as: SONATA Take 10-20 mg by mouth at bedtime.       Allergies:  Allergies  Allergen Reactions  . Ephedrine Other (See Comments)    Pt becomes hyper  . Aspirin     REACTION: aggrivates colitis  . Crestor [Rosuvastatin]     REACTION: increased lfts  . Erythromycin     REACTION: GI upset  . Nsaids     REACTION: aggrivate colitis  . Oxycodone Other (See Comments)    Panic Attack    Past Medical History:  Diagnosis Date  . ADD (attention deficit disorder) 11/19/2012  . Allergic rhinitis, cause unspecified   . Anxiety state, unspecified   . Arthritis   . Asymptomatic postmenopausal status (age-related) (natural)   . Atypical hyperplasia of left breast 2000  . Atypical mole 03/16/2018   R paraspinal mid upper back, excision  . Atypical mole 02/17/2018   R spinal upper back, moderate  .  Atypical mole 08/11/2017   L sternum, excision  . Atypical mole 05/27/2017   L post neck,  moderate  . Benign neoplasm of other and unspecified site of the digestive system   . Bipolar disorder ([place])   . Breast cancer ([place])    Left Breast 68m invasive CA left uiq, dcis left uoq and a lot ADH, ALH in both breasts.  .Marland KitchenCAD (coronary artery disease)    a. 08/2014 NSTEMI/Cath: LM nl, LAD 50p, D1/D2 min irregs, LCX nl, OM1 40, LPDA nl, RI 99ost (2.031mvessel->med Rx), RCA nl, AM 60, nl EF; b. 11/2016 MV: EF 74%, no ischemia (performed 2/2 dyspnea & new inf TWI).  . Marland Kitchenarcinoma of left breast ([facility]/19/2016  . Cataract   . Chronic diastolic CHF (congestive heart failure) ([facility]   a. 08/2014 Echo: EF 60-65%, Gr 1 DD, nl LA.  . Marland Kitchenlotting disorder ([facility]   on blood thiner, Plavix  . Cyst    on Achilles tendon  . Depression   . Diabetes mellitus ([facility]   frank  . Dysuria   . Edema   . Family history of malignant neoplasm of gastrointestinal tract   . Fibromyalgia   . GERD (gastroesophageal reflux disease)   . Glaucoma    pre glaucoma, 05/14/2018 pt. denies glaucoma  . Headache    migraines  . Hiatal hernia   . History of alcoholism ([facility]  . History of migraines   . History of ovarian cyst   . Hx of adenomatous colonic polyps   . Insomnia, unspecified   . Myocardial infarction ([facility]8-21-16  . OCD (obsessive compulsive disorder)   . Osteopenia 09/25/2016   Femoral neck T -1.1  9/18  . Other screening mammogram   . Pure hypercholesterolemia   . Rosacea   . Subacute confusional state 11/19/2012  . Tubular adenoma of colon 01/10/11  . Ulcerative colitis, unspecified   . Unspecified asthma(493.90)   . Unspecified essential hypertension   . Unspecified hypothyroidism   . Unspecified vitamin D deficiency   . Vertigo     Past Surgical History:  Procedure Laterality Date  . APPENDECTOMY    . AXILLARY SENTINEL NODE BIOPSY Left 05/23/2014   Procedure: AXILLARY SENTINEL NODE BIOPSY;  Surgeon: SeChristene LyeMD;  Location: [facility];  Service: General;  Laterality: Left;  .  BREAST BIOPSY  Aug 2000   on tamoxifen, atypical hyperplasia  . BREAST BIOPSY Left 04/2014  . BREAST SURGERY Left Aug 2000   lumpectomy/ Dr CrSharlet [place]. BREAST SURGERY Bilateral 05/23/14   Mastectomy  . BUNIONECTOMY Right   . CARDIAC CATHETERIZATION N/A 09/05/2014   Procedure: Left Heart Cath and Coronary Angiography;  Surgeon: MuWellington HampshireMD;  [facility];  Service: Cardiovascular;  Laterality: N/A;  . CARDIAC CATHETERIZATION  09-05-14  . CATARACT EXTRACTION W/ INTRAOCULAR LENS IMPLANT Bilateral   . CESAREAN SECTION  1996/1997   placenta previa, gest DM, pre-eclampsia  . CHOLECYSTECTOMY  1997   adhesions also  . COLONOSCOPY  01/2001   Ulcerative colitis  . COLONOSCOPY  04/2004   UC, polyp  . COLONOSCOPY  12/08   UC, no polyps  . CORONARY STENT INTERVENTION N/A 04/06/2020   Procedure: CORONARY STENT INTERVENTION;  Surgeon: EnNelva BushMD;  [facility];  Service: Cardiovascular;  Laterality: N/A;  . DEXA  04/1999 and 2010   normal  . ESOPHAGOGASTRODUODENOSCOPY  09/2001   polyp  . exercise stress  test  11/2003   negative  . FEMUR FRACTURE SURGERY Left   . LEFT HEART CATH AND CORONARY ANGIOGRAPHY N/A 04/06/2020   Procedure: LEFT HEART CATH AND CORONARY ANGIOGRAPHY;  Surgeon: Minna Merritts, MD;  Location: [facility];  Service: Cardiovascular;  Laterality: N/A;  . MASTECTOMY Bilateral   . MECKEL DIVERTICULUM EXCISION  1999  . NASAL SINUS SURGERY  05/1997  . nuclear stress test  06/2007   negative  . precancerous mole removed    . SIMPLE MASTECTOMY WITH AXILLARY SENTINEL NODE BIOPSY Bilateral 05/23/2014   Procedure: Bilateral simple mastectomy, left sentinel node biopsy ;  Surgeon: Christene Lye, MD;  Location: [facility] ORS;  Service: General;  Laterality: Bilateral;  . Sleep study  11/09   no apnea, but did snore (done by [facility])  . TONSILLECTOMY  1984  . TUBAL LIGATION    . WISDOM TOOTH EXTRACTION  1980's    Family  History  Problem Relation Age of Onset  . Coronary artery disease Father   . Colon cancer Father   . Alzheimer's disease Father   . Dementia Father   . Colon polyps Father   . Coronary artery disease Mother   . Hypertension Mother   . Osteoporosis Mother   . Crohn's disease Mother        crohns colitis  . Breast cancer Other        great aunts  . Coronary artery disease Other        Uncle (also AAA)  . Diabetes Other        remote family history  . Stroke Cousin   . Esophageal cancer Neg Hx   . Rectal cancer Neg Hx   . Stomach cancer Neg Hx     Social History:  reports that she quit smoking about 27 years ago. Her smoking use included cigarettes. She has a 1.25 pack-year smoking history. She has never used smokeless tobacco. She reports that she does not drink alcohol and does not use drugs.  REVIEW Of SYSTEMS:   HYPERCALCEMIA: Her calcium has been previously high PTH in the past has been borderline for hyperparathyroidism and is last below 20  She is taking OTC vitamin D3, ? 2000 units and has been on calcium recommended by her oncologist Vitamin D level therapeutic  Bone density in 9/15 shows T score -1.1 at the hip  Lab Results  Component Value Date   PTH 18 10/25/2016   CALCIUM 10.4 06/05/2020   PHOS 2.3 01/29/2007     Lab Results  Component Value Date   CALCIUM 10.4 06/05/2020   PHOS 2.3 01/29/2007   Lab Results  Component Value Date   VD25OH 43.86 03/24/2020   VD25OH 41.98 06/03/2019   VD25OH 39.94 04/21/2019     HYPERTENSION: Blood pressure is controlled, managed by cardiologist.   Now taking 5 mg Benicar  Lab Results  Component Value Date   CREATININE 0.94 06/05/2020   BUN 23 06/05/2020   NA 145 06/05/2020   K 4.6 06/05/2020   CL 106 06/05/2020   CO2 29 06/05/2020    History of hypercholesterolemia with known CAD: Management by cardiologist and has been on 80 mg atorvastatin since her MI She is also taking Zetia that was added  subsequently Not clear how her LDL was only 15 in February compared to direct LDL of 125 in January  Repatha was approved but she was told by cardiologist not to start this  Lab Results  Component  Value Date   CHOL 120 03/07/2020   CHOL 181 12/13/2019   CHOL 154 09/06/2019   Lab Results  Component Value Date   HDL 40.30 03/07/2020   HDL 50.20 12/13/2019   HDL 41.60 09/06/2019   Lab Results  Component Value Date   LDLCALC 46 03/07/2020   LDLCALC 74 02/05/2019   LDLCALC 79 09/28/2018   Lab Results  Component Value Date   TRIG 172.0 (H) 03/07/2020   TRIG 216.0 (H) 12/13/2019   TRIG 209.0 (H) 09/06/2019   Lab Results  Component Value Date   CHOLHDL 3 03/07/2020   CHOLHDL 4 12/13/2019   CHOLHDL 4 09/06/2019   Lab Results  Component Value Date   LDLDIRECT 125.0 02/07/2020   LDLDIRECT 109.0 12/13/2019   LDLDIRECT 86.0 09/06/2019     Likely fatty liver: Liver functions show variable results   Lab Results  Component Value Date   ALT 45 (H) 06/05/2020       Examination:   BP 118/76   Pulse 80   Ht 5' 6.5" (1.689 m)   Wt 187 lb 3.2 oz (84.9 kg)   LMP 08/23/2006 (Approximate) Comment: tubal ligation  SpO2 99%   BMI 29.76 kg/m       Assessments   DIABETES with obesity:  See history of present illness for evaluation of  current management, blood sugar patterns and problems identified  Her A1c is much improved at 5.9  She is on a regimen of basal insulin, Jardiance and Ozempic 1 mg Although she is requiring as much as 60 units of insulin with Levemir her fasting readings are just above 100 most of the time No obvious high readings after meals but these are checked infrequently Recently starting to do more exercise Diet has been generally better also  She will continue the same regimen Reminded her to check more readings after meals  Hypothyroidism, post ablative:  TSH now consistently normal Continue 250 mg of LEVOXYL  LIPIDS: Although LDL was  only 46 earlier this year this is unusual and will need to repeat to make sure this is consistently controlled Diet may be relatively better However if LDL is around 70 or more we will start [place] which she is able to get now  Follow-up in 4 months unless lipid management needs to be done sooner  There are no Patient Instructions on file for this visit.    Elayne Snare 06/14/2020, 10:21 AM   Note: This office note was prepared with Dragon voice recognition system technology. Any transcriptional errors that result from this process are unintentional.

## 2020-06-15 ENCOUNTER — Other Ambulatory Visit: Payer: Self-pay | Admitting: *Deleted

## 2020-06-15 LAB — LIPID PANEL
Cholesterol: 122 mg/dL (ref 0–200)
HDL: 45.8 mg/dL (ref >39.00)
LDL Cholesterol: 55 mg/dL (ref 0–99)
NonHDL: 76.15
Total CHOL/HDL Ratio: 3
Triglycerides: 106 mg/dL (ref 0.0–149.0)
VLDL: 21.2 mg/dL (ref 0.0–40.0)

## 2020-06-15 MED ORDER — LEVEMIR FLEXTOUCH 100 UNIT/ML ~~LOC~~ SOPN: 60.0000 [IU] | PEN_INJECTOR | Freq: Every evening | SUBCUTANEOUS | 5 refills | Status: DC

## 2020-06-15 NOTE — Progress Notes (Signed)
Cardiology Office Note    Date:  06/20/2020   ID:  Lindsay, Fry 1960/09/05, MRN [id number]  PCP:  Abner Greenspan, MD  Cardiologist:  Ida Rogue, MD  Electrophysiologist:  None   Chief Complaint: Follow up  History of Present Illness:   ELA Fry is a 60 y.o. female with history of CAD with NSTEMI in 08/2014 with recent PCI to [place] in 03/2020, fibromyalgia, frequent PVCs, DM2, ulcerative colitis, Covid infection in 02/2020, HTN, HLD, left-sided breast cancer diagnosed in 2000 status post bilateral mastectomy, obesity, migraine disorder, chronic back pain, GERD, bipolar disorder, and anxiety who presents for follow up of PVCs.  She was admitted to the hospital in 08/2014 with a NSTEMI. Cath at that time showed severe ostial ramus intermedius disease which was medically managed secondary to being a small vessel approximately 2 mm in diameter. She otherwise had nonobstructive disease. Echo showed an EF of 60 to 65%, no regional wall motion abnormalities, grade 1 diastolic dysfunction, no significant valvular abnormalities, normal RV systolic function, normal PASP. Lower extremity ABIs in 09/2014 were normal. Zio patch from 01/2016 showed a predominant rhythm of sinus with an average heart rate of 76 bpm (range 56 to 133 bpm), first-degree AV block was present, rare PACs, atrial couplets, PVCs, and ventricular triplets. No significant arrhythmias were noted. She was seen in 10/2016 with reported dyspnea on exertion and noted to have new anterior T wave changes. Subsequent nuclear stress test was nonischemic.  She developed gait instability and memory disturbance late 2020 and was evaluated by neurology 01/2019. MRI of the brain 02/2019 was unrevealing. Carotid Dopplers normal 08/12/2019.  She was evaluated in late 2021 with dizziness and vertigo with symptoms improving with meclizine.  There was also some concern polypharmacy was contributing.  She was seen in the ED in 02/2020 with  bradycardia, dyspnea, and presyncope with associated chest pain.  She was significantly hypokalemic with a potassium of 2.9.  High-sensitivity troponin negative.  She had mistakenly taken an extra olmesartan.  Echo in 03/2020 showed an EF of 50 to 55%, no regional wall motion abnormalities, normal internal LV cavity size, grade 1 diastolic dysfunction, normal RV systolic function, and frequent PVCs were noted.  Zio patch showed a predominant rhythm of sinus with an average heart rate of 96 bpm with a range of 72 to 187 bpm, 1 run of NSVT lasting 5 beats, 1 run of SVT lasting 9 beats, rare PACs, frequent PVCs representing a 26.7% burden.  Coronary CTA in 03/2020 showed a calcium score of 105 which was the 88th percentile, occluded proximal ramus that appeared to be chronic, and 25 to 49% stenosis involving the mid LAD with recommendation to proceed with cardiac cath.  She underwent cardiac cath on 04/06/2020 which showed severe single-vessel CAD with 95% OM2 stenosis and CTO of the ramus intermedius/OM1 with noncritical disease of the LAD/diagonal branch is also being present.  She underwent successful PCI to the proximal OM2.   She contacted the office in 05/2020, noting symptoms of lightheadedness with BP of 85/54 upon standing with a heart rate of 114 bpm.  She was concerned this was related to olmesartan.  She was last seen in the office on 06/06/2020 and was doing well from a cardiac perspective.  Since undergoing PCI she has not had any further chest pain, and she was tolerating DAPT without issues.  BP log from home showed blood pressure had largely been reasonably controlled with an occasional elevated  reading or soft reading.  Due to elevated readings, she increased her olmesartan from 5 mg to 10 mg in mid May.  She was drinking approximately 4 cups of coffee per day and would sometimes drink Coke Zeros as well.  With noted significant PVC burden, she was started on Toprol XL 12.5 mg nightly, and olmesartan  was decreased to 5 mg.  She was advised to decrease caffeine intake and increase water.   She comes in accompanied by her husband today and continues to do well from a cardiac perspective.  She has tolerated the addition of metoprolol without issues.  Overall, her blood pressures have been well controlled following the addition of metoprolol.  She did have 1 episode of hypotension 1 morning several days ago with readings in the 01S to 01U systolic with associated dizziness.  As the morning went on her blood pressure did improve up into the 932T systolic and she took her regularly scheduled olmesartan at that time.  No presyncope or syncope.  No chest pain or worsening shortness of breath.  No lower extremity swelling.  She is tolerating DAPT without issues.   Labs independently reviewed:  06/2018 - TC 122, TG 106, HDL 45, LDL 55 05/2020 - magnesium 2.4, potassium 4.6, BUN 23, serum creatinine 0.94, albumin 4.6, AST normal, ALT 45, A1c 5.9, TSH normal 04/2020 - Hgb 15.3, PLT 302 02/2020 - TC 120, TG 172, HDL 40, LDL 46   Past Medical History:  Diagnosis Date  . ADD (attention deficit disorder) 11/19/2012  . Allergic rhinitis, cause unspecified   . Anxiety state, unspecified   . Arthritis   . Asymptomatic postmenopausal status (age-related) (natural)   . Atypical hyperplasia of left breast 2000  . Atypical mole 03/16/2018   R paraspinal mid upper back, excision  . Atypical mole 02/17/2018   R spinal upper back, moderate  . Atypical mole 08/11/2017   L sternum, excision  . Atypical mole 05/27/2017   L post neck, moderate  . Benign neoplasm of other and unspecified site of the digestive system   . Bipolar disorder ([place])   . Breast cancer ([place])    Left Breast 51m invasive CA left uiq, dcis left uoq and a lot ADH, ALH in both breasts.  .Marland KitchenCAD (coronary artery disease)    a. 08/2014 NSTEMI/Cath: LM nl, LAD 50p, D1/D2 min irregs, LCX nl, OM1 40, LPDA nl, RI 99ost (2.053mvessel->med Rx), RCA nl,  AM 60, nl EF; b. 11/2016 MV: EF 74%, no ischemia (performed 2/2 dyspnea & new inf TWI).  . Marland Kitchenarcinoma of left breast ([place]5/19/2016  . Cataract   . Chronic diastolic CHF (congestive heart failure) (HCH[place]   a. 08/2014 Echo: EF 60-65%, Gr 1 DD, nl LA.  . Marland Kitchenlotting disorder (HC[facility] [place]   on blood thiner, Plavix  . Cyst    on Achilles tendon  . Depression   . Diabetes mellitus ([facility]CE[place]   frank  . Dysuria   . Edema   . Family history of malignant neoplasm of gastrointestinal tract   . Fibromyalgia   . GERD (gastroesophageal reflux disease)   . Glaucoma    pre glaucoma, 05/14/2018 pt. denies glaucoma  . Headache    migraines  . Hiatal hernia   . History of alcoholism (HCEldridge  . History of migraines   . History of ovarian cyst   . Hx of adenomatous colonic polyps   . Insomnia, unspecified   . Myocardial infarction ([place]CDav[place]8-21-16  .  OCD (obsessive compulsive disorder)   . Osteopenia 09/25/2016   Femoral neck T -1.1  9/18  . Other screening mammogram   . Pure hypercholesterolemia   . Rosacea   . Subacute confusional state 11/19/2012  . Tubular adenoma of colon 01/10/11  . Ulcerative colitis, unspecified   . Unspecified asthma(493.90)   . Unspecified essential hypertension   . Unspecified hypothyroidism   . Unspecified vitamin D deficiency   . Vertigo     Past Surgical History:  Procedure Laterality Date  . APPENDECTOMY    . AXILLARY SENTINEL NODE BIOPSY Left 05/23/2014   Procedure: AXILLARY SENTINEL NODE BIOPSY;  Surgeon: Christene Lye, MD;  Location: [facility] ORS;  Service: General;  Laterality: Left;  . BREAST BIOPSY  Aug 2000   on tamoxifen, atypical hyperplasia  . BREAST BIOPSY Left 04/2014  . BREAST SURGERY Left Aug 2000   lumpectomy/ Dr Sharlet Salina  . BREAST SURGERY Bilateral 05/23/14   Mastectomy  . BUNIONECTOMY Right   . CARDIAC CATHETERIZATION N/A 09/05/2014   Procedure: Left Heart Cath and Coronary Angiography;  Surgeon: Wellington Hampshire, MD;  Location: [place] [facility];   Service: Cardiovascular;  Laterality: N/A;  . CARDIAC CATHETERIZATION  09-05-14  . CATARACT EXTRACTION W/ INTRAOCULAR LENS IMPLANT Bilateral   . CESAREAN SECTION  1996/1997   placenta previa, gest DM, pre-eclampsia  . CHOLECYSTECTOMY  1997   adhesions also  . COLONOSCOPY  01/2001   Ulcerative colitis  . COLONOSCOPY  04/2004   UC, polyp  . COLONOSCOPY  12/08   UC, no polyps  . CORONARY STENT INTERVENTION N/A 04/06/2020   Procedure: CORONARY STENT INTERVENTION;  Surgeon: Nelva Bush, MD;  Location: [place] [facility];  Service: Cardiovascular;  Laterality: N/A;  . DEXA  04/1999 and 2010   normal  . ESOPHAGOGASTRODUODENOSCOPY  09/2001   polyp  . exercise stress test  11/2003   negative  . FEMUR FRACTURE SURGERY Left   . LEFT HEART CATH AND CORONARY ANGIOGRAPHY N/A 04/06/2020   Procedure: LEFT HEART CATH AND CORONARY ANGIOGRAPHY;  Surgeon: Minna Merritts, MD;  Location: [place] [facility];  Service: Cardiovascular;  Laterality: N/A;  . MASTECTOMY Bilateral   . MECKEL DIVERTICULUM EXCISION  1999  . NASAL SINUS SURGERY  05/1997  . nuclear stress test  06/2007   negative  . precancerous mole removed    . SIMPLE MASTECTOMY WITH AXILLARY SENTINEL NODE BIOPSY Bilateral 05/23/2014   Procedure: Bilateral simple mastectomy, left sentinel node biopsy ;  Surgeon: Christene Lye, MD;  Location: [facility];  Service: General;  Laterality: Bilateral;  . Sleep study  11/09   no apnea, but did snore (done by [facility])  . TONSILLECTOMY  1984  . TUBAL LIGATION    . WISDOM TOOTH EXTRACTION  1980's    Current Medications: Current Meds  Medication Sig  . acetaminophen (TYLENOL) 650 MG CR tablet Take 1,300 mg by mouth 2 (two) times daily.  Marland Kitchen amantadine (SYMMETREL) 100 MG capsule Take 100 mg by mouth 2 (two) times daily.  . Ascorbic Acid (VITAMIN C PO) Take 1,000 mg by mouth daily.  Marland Kitchen aspirin 81 MG chewable tablet Chew 1 tablet (81 mg total) by mouth daily.  Marland Kitchen atorvastatin (LIPITOR) 80 MG  tablet Take one pill daily  . B Complex Vitamins (VITAMIN B COMPLEX PO) Take 1 tablet by mouth every other day.  Marland Kitchen BAYER MICROLET LANCETS lancets USE AS DIRECTED TO CHECK BLOOD SUGAR TWICE DAILY  . Blood Glucose  Monitoring Suppl (CONTOUR NEXT MONITOR) w/Device KIT 1 each by Does not apply route in the morning and at bedtime. Use Contour Next meter to check blood sugar twice daily.  . Calcium Carb-Cholecalciferol (CALTRATE 600+D3 PO) Take 1 tablet by mouth daily. 300 mg Vitamin D3  . cholecalciferol (VITAMIN D3) 25 MCG (1000 UNIT) tablet Take 1,000 Units by mouth daily.  . clopidogrel (PLAVIX) 75 MG tablet TAKE 1 TABLET(75 MG) BY MOUTH DAILY  . Co-Enzyme Q-10 100 MG CAPS Take 100 mg by mouth daily.  . diazepam (VALIUM) 10 MG tablet Take 5-10 mg by mouth 2 (two) times daily as needed for anxiety.  . diphenhydrAMINE (BENADRYL) 25 MG tablet Take 25-50 mg by mouth at bedtime.  . docusate sodium (COLACE) 100 MG capsule Take 100-300 mg by mouth daily as needed for mild constipation or moderate constipation.  Marland Kitchen ezetimibe (ZETIA) 10 MG tablet TAKE 1 TABLET(10 MG) BY MOUTH DAILY  . famotidine (PEPCID) 40 MG tablet Take 40 mg by mouth 2 (two) times daily. 30 min. Before meal and at bedtime  . gabapentin (NEURONTIN) 300 MG capsule TAKE 1 CAPSULE(300 MG) BY MOUTH THREE TIMES DAILY  . glucose blood (CONTOUR NEXT TEST) test strip Use as instructed  . insulin detemir (LEVEMIR FLEXTOUCH) 100 UNIT/ML FlexPen Inject 60 Units into the skin every evening.  . Insulin Pen Needle (B-D UF III MINI PEN NEEDLES) 31G X 5 MM MISC USE FOR INJECTIONS TWICE DAILY  . JARDIANCE 25 MG TABS tablet TAKE 1 TABLET(25 MG) BY MOUTH DAILY (Patient taking differently: Take 25 mg by mouth daily.)  . ketoconazole (NIZORAL) 2 % cream Apply to affected areas one to two times daily as needed.  . lamoTRIgine (LAMICTAL) 200 MG tablet Take 400 mg by mouth daily.  Marland Kitchen latanoprost (XALATAN) 0.005 % ophthalmic solution Place 1 drop into both eyes 2  (two) times a week.   Marland Kitchen LEVOXYL 125 MCG tablet Take 2 tablets (250 mcg total) by mouth daily before breakfast.  . LINZESS 290 MCG CAPS capsule TAKE 1 CAPSULE(290 MCG) BY MOUTH DAILY BEFORE BREAKFAST  . loperamide (IMODIUM A-D) 2 MG tablet Take 2 mg by mouth daily as needed for diarrhea or loose stools.  . Melatonin 10 MG CAPS Take 10 mg by mouth at bedtime.  . mesalamine (LIALDA) 1.2 g EC tablet TAKE 2 TABLETS BY MOUTH DAILY  . metoprolol succinate (TOPROL XL) 25 MG 24 hr tablet Take 0.5 tablets (12.5 mg total) by mouth as needed (As needed at bedtime for systolic blood pressure greater than 100).  . Multiple Vitamin (MULTIVITAMIN) tablet Take 1 tablet by mouth daily. Woman 50 plus  . nitroGLYCERIN (NITROSTAT) 0.4 MG SL tablet Place 1 tablet (0.4 mg total) under the tongue every 5 (five) minutes as needed for chest pain.  Marland Kitchen olmesartan (BENICAR) 5 MG tablet Take 1 tablet (5 mg total) by mouth daily.  . ondansetron (ZOFRAN-ODT) 4 MG disintegrating tablet DISSOLVE 1 TABLET(4 MG) ON THE TONGUE EVERY 6 HOURS AS NEEDED FOR NAUSEA OR VOMITING  . pantoprazole (PROTONIX) 40 MG tablet TAKE 1 TABLET(40 MG) BY MOUTH TWICE DAILY  . polyethylene glycol (MIRALAX / GLYCOLAX) 17 g packet Take 17 g by mouth 3 (three) times daily as needed for moderate constipation, mild constipation or severe constipation. In water or beverage  . Probiotic Product (CULTURELLE PROBIOTICS PO) Take 1 capsule by mouth daily. For woman  . QUEtiapine (SEROQUEL XR) 400 MG 24 hr tablet Take 800 mg by mouth at bedtime.  Marland Kitchen  Semaglutide, 1 MG/DOSE, (OZEMPIC, 1 MG/DOSE,) 4 MG/3ML SOPN Inject 1 mg as directed every Monday.  . simethicone (MYLICON) 80 MG chewable tablet Chew 80 mg by mouth 4 (four) times daily.  . valACYclovir (VALTREX) 1000 MG tablet TAKE 1 TABLET(1000 MG) BY MOUTH DAILY  . VRAYLAR capsule Take 3 mg by mouth at bedtime.  Marland Kitchen VYVANSE 50 MG capsule Take 50 mg by mouth every morning.  . zaleplon (SONATA) 10 MG capsule Take 10-20 mg  by mouth at bedtime.    Allergies:   Ephedrine, Aspirin, Crestor [rosuvastatin], Erythromycin, Nsaids, and Oxycodone   Social History   Socioeconomic History  . Marital status: Married    Spouse name: alan  . Number of children: 2  . Years of education: college  . Highest education level: Not on file  Occupational History  . Occupation: unemployed  Tobacco Use  . Smoking status: Former Smoker    Packs/day: 0.25    Years: 5.00    Pack years: 1.25    Types: Cigarettes    Quit date: 01/14/1993    Years since quitting: 27.4  . Smokeless tobacco: Never Used  Vaping Use  . Vaping Use: Never used  Substance and Sexual Activity  . Alcohol use: No    Alcohol/week: 0.0 standard drinks    Comment: Recovered ETOH  . Drug use: No  . Sexual activity: Not Currently    Partners: Male  Other Topics Concern  . 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      [employer] telephone triage   Social Determinants of Health   Financial Resource Strain: Not on file  Food Insecurity: Not on file  Transportation Needs: Not on file  Physical Activity: Not on file  Stress: Not on file  Social Connections: Not on file     Family History:  The patient's family history includes Alzheimer's disease in her father; Breast cancer in an other family member; Colon cancer in her father; Colon polyps in her father; Coronary artery disease in her father, mother, and another family member; Crohn's disease in her mother; Dementia in her father; Diabetes in an other family member; Hypertension in her mother; Osteoporosis in her mother; Stroke in her cousin. There is no history of Esophageal cancer, Rectal cancer, or Stomach cancer.  ROS:   Review of Systems  Constitutional: Positive for malaise/fatigue. Negative for chills, diaphoresis, fever and weight loss.  HENT: Negative for congestion.   Eyes: Negative for  discharge and redness.  Respiratory: Negative for cough, sputum production, shortness of breath and wheezing.   Cardiovascular: Negative for chest pain, palpitations, orthopnea, claudication, leg swelling and PND.  Gastrointestinal: Negative for abdominal pain, blood in stool, heartburn, melena, nausea and vomiting.  Musculoskeletal: Negative for falls and myalgias.  Skin: Negative for rash.  Neurological: Positive for dizziness and weakness. Negative for tingling, tremors, sensory change, speech change, focal weakness and loss of consciousness.  Endo/Heme/Allergies: Does not bruise/bleed easily.  Psychiatric/Behavioral: Negative for substance abuse. The patient is not nervous/anxious.   All other systems reviewed and are negative.    EKGs/Labs/Other Studies Reviewed:    Studies reviewed were summarized above. The additional studies were reviewed today:  Zio patch 03/2020:Sinus rhythm min HR of 72 bpm, max HR of 187 bpm, and avg HR of 96 bpm.   1 run of  Ventricular Tachycardia occurred lasting 5 beats with a max rate of 187 bpm (avg 131 bpm).   1 run of Supraventricular Tachycardia occurred lasting 9 beats with a max rate of 184 bpm (avg 162 bpm).  Isolated SVEs were rare (<1.0%), and no SVE Couplets or SVE Triplets were present.  Isolated VEs were frequent (26.7%, [id number]), VE Couplets were rare (<1.0%, 1250), and VE Triplets were rare (<1.0%, 38).  Ventricular Bigeminy and Trigeminy were present. __________  [facility] 04/06/2020: Conclusions: 1. Severe single-vessel coronary artery disease with 95% OM 2 stenosis and chronic total occlusion of ramus intermedius/OM1, as detailed in Dr. Donivan Scull diagnostic catheterization note. Noncritical disease of the LAD/diagonal branches is also present. 2. Successful PCI to proximal OM2 using Resolute Onyx 2.75 x 12 mm drug-eluting stent with 0% residual stenosis and TIMI-3 flow.  Conclusions: 1. Favor dual antiplatelet therapy with aspirin and  clopidogrel for up to 12 months. Given questionable intolerance of aspirin I would favor trying for at least 1 month of DAPT before switching to clopidogrel alone. 2. Obtain cytochrome P450 2C19 genotype to ensure appropriate response to clopidogrel. 3. Aggressive secondary prevention. 4. Overnight observation with plans for discharge home tomorrow if no post catheterization complications. __________  Coronary CTA 03/30/2020: Aorta: Normal size. Minimal aortic root wall calcifications. No dissection.  Aortic Valve: Trileaflet. No calcifications.  Coronary Arteries: Normal coronary origin. left dominance.  RCA is a non dominant artery, there is no plaque.  Left main is a large artery that gives rise to LAD, ramus and LCX arteries.  The Ramus artery appears to be chronically occluded  LAD is has calcified plaque in the proximal to mid segment causing mild 25-49% stenosis.  LCX is a dominant artery with mild calcifications in the mid vessel causing minimal stenosis (<25%), at the bifurcation into OM1 branch. It continues to supply the PDA.  Other findings:  Normal pulmonary vein drainage into the left atrium.  Normal left atrial appendage without a thrombus.  Normal size of the pulmonary artery.  IMPRESSION: 1. Coronary calcium score of 105. This was 88th percentile for age and sex matched control. 2. Normal coronary origin with left dominance. 3. Mild stenosis in the mid LAD (25-49%) 4. Total occlusion (100%) of Ramus branch proximally, appears chronic. 5. CAD-RADS 5 Total coronary occlusion (100%). Consider cardiac catheterization or viability assessment. Consider symptom-guided anti-ischemic pharmacotherapy as well as risk factor modification per guideline directed care. 6. Study quality degraded by motion artifacts __________  2D echo 03/16/2020: 1. Left ventricular ejection fraction, by estimation, is 50 to 55%. The  left ventricle has low normal  function. The left ventricle has no regional  wall motion abnormalities. Left ventricular diastolic parameters are  consistent with Grade I diastolic  dysfunction (impaired relaxation).  2. Right ventricular systolic function is normal. The right ventricular  size is normal.  3. Frequent PVCs  __________  Event monitor 01/2016: Normal sinus rhythm Min HR of 56 bpm, max HR of 133 bpm, and avg HR of 76 bpm.   First Degree AV Block was present. Isolated SVEs were rare (<1.0%), SVE Couplets were rare (<1.0%), and no SVE Triplets were present.  Isolated VEs were rare (<1.0%, 766), VE Triplets were rare (<1.0%, 1), and no VE Couplets were present. __________  2D echo 09/05/2014: - Left ventricle: The cavity size was normal. Systolic function was  normal. The estimated ejection fraction was in the range of 60%  to 65%. Wall motion was normal; there were no regional wall  motion abnormalities. Doppler parameters are consistent with  abnormal left ventricular relaxation (grade 1 diastolic  dysfunction).  - Left atrium: The atrium was normal in size.  - Right ventricle: Systolic function was normal.  - Pulmonary arteries: Systolic pressure was within the normal  range.   Impressions:   - Normal study. ___________  LHC 09/05/2014:  Acute Mrg lesion, 60% stenosed.  Ost Ramus to Ramus lesion, 99% stenosed.  Prox LAD lesion, 50% stenosed.  1st Mrg lesion, 40% stenosed.  1. Severe one-vessel coronary artery disease. The culprit for myocardial infarction this subtotal occlusion of the ostial ramus. This vessel is medium size and the diameter is about 2 mm. 2. Normal LV systolic function and mildly elevated left ventricular end-diastolic pressure.  Recommendations: The ostial ramus stenosis is in a location where placing a stent might compromise the flow to the LAD or left circumflex. Doing balloon angioplasty alone would probably not have good long-term results given  that the diameter is about 2 mm. Thus, I think the best option is medical therapy for now. I increased the dose of atorvastatin and placed the patient on Brilinta to be taken for one year. Recommend aggressive control of risk factors and blood pressure control. __________  Nuclear stress test 09/07/2012: No significant ischemia, EF 58%, low risk scan   EKG:  EKG is ordered today.  The EKG ordered today demonstrates NSR, 80 bpm, frequent PVCs in a pattern of ventricular bigeminy, poor R wave progression along the precordial leads, nonspecific lateral ST-T changes which are unchanged when compared to prior tracing                                              Recent Labs: 05/01/2020: Hemoglobin 15.3; Platelets 302 06/05/2020: ALT 45; BUN 23; Creatinine, Ser 0.94; Potassium 4.6; Sodium 145; TSH 3.12 06/06/2020: Magnesium 2.4  Recent Lipid Panel    Component Value Date/Time   CHOL 122 06/14/2020 1046   TRIG 106.0 06/14/2020 1046   HDL 45.80 06/14/2020 1046   CHOLHDL 3 06/14/2020 1046   VLDL 21.2 06/14/2020 1046   LDLCALC 55 06/14/2020 1046   LDLDIRECT 125.0 02/07/2020 1134    PHYSICAL EXAM:    VS:  BP 104/78   Pulse 80   Ht 5' 6.5" (1.689 m)   Wt 185 lb (83.9 kg)   LMP 08/23/2006 (Approximate) Comment: tubal ligation  BMI 29.41 kg/m   BMI: Body mass index is 29.41 kg/m.  Physical Exam Vitals reviewed.  Constitutional:      Appearance: She is well-developed.  HENT:     Head: Normocephalic and atraumatic.  Eyes:     General:        Right eye: No discharge.        Left eye: No discharge.  Neck:     Vascular: No JVD.  Cardiovascular:     Rate and Rhythm: Normal rate and regular rhythm. Occasional extrasystoles are present.    Pulses: No midsystolic click and no opening snap.          Posterior tibial pulses are 2+ on the right side and 2+ on the left side.     Heart sounds: Normal heart sounds, S1 normal and S2 normal. Heart sounds not distant. No murmur heard. No friction  rub.  Pulmonary:     Effort: Pulmonary effort is normal. No respiratory distress.  Breath sounds: Normal breath sounds. No decreased breath sounds, wheezing or rales.  Chest:     Chest wall: No tenderness.  Abdominal:     General: There is no distension.     Palpations: Abdomen is soft.     Tenderness: There is no abdominal tenderness.  Musculoskeletal:     Cervical back: Normal range of motion.  Skin:    General: Skin is warm and dry.     Nails: There is no clubbing.  Neurological:     Mental Status: She is alert and oriented to person, place, and time.  Psychiatric:        Speech: Speech normal.        Behavior: Behavior normal.        Thought Content: Thought content normal.        Judgment: Judgment normal.      Wt Readings from Last 3 Encounters:  06/20/20 185 lb (83.9 kg)  06/14/20 187 lb 3.2 oz (84.9 kg)  06/06/20 184 lb (83.5 kg)     ASSESSMENT & PLAN:   1. CAD involving the native coronary arteries without angina: She is doing well without symptoms concerning for angina.  Continue secondary prevention and current medical therapy including aspirin, clopidogrel without interruption for up to 12 months from date of PCI, along with atorvastatin, ezetimibe, metoprolol, and olmesartan.  No indication for further ischemic testing at this time.  2. Frequent PVCs: Asymptomatic.  She does continue to have frequent PVCs in a pattern of ventricular bigeminy on twelve-lead EKG today.  She has cut out all caffeine and has not recently taken Vyvanse.  She has tolerated the addition of low-dose Toprol-XL, however relative hypotension precludes further escalation of beta-blockade at this time.  Unable to discontinue ARB with underlying history of diabetes.  We will place a 3-day Zio patch to quantify PVC burden on maximally tolerated beta-blocker therapy.  If she continues to have significant PVC burden we will plan to refer her to EP for consideration of AAD versus PVC ablation.  Of  note, recent TSH was normal along with potassium/magnesium at goal.  3. HTN: Blood pressure is on the soft side in the office today.  She remains on low-dose olmesartan and metoprolol succinate.  Low-sodium diet recommended.  4. HLD: LDL 46 in 02/2020 with repeat LDL of 55 on 06/14/2020.  She remains on atorvastatin 80 mg as well as ezetimibe.  She has been approved for Repatha with the assistance of her endocrinologist.  Given her comorbid conditions it is certainly not unreasonable to have her start Repatha, though ideally would like to maintain an LDL of greater than 30 if possible.  Disposition: F/u with Dr. Rockey Situ or an APP in 1 month.   Medication Adjustments/Labs and Tests Ordered: Current medicines are reviewed at length with the patient today.  Concerns regarding medicines are outlined above. Medication changes, Labs and Tests ordered today are summarized above and listed in the Patient Instructions accessible in Encounters.   Signed, Christell Faith, PA-C 06/20/2020 12:03 PM     [address] [place], [place] [address] [phone]

## 2020-06-20 ENCOUNTER — Other Ambulatory Visit: Payer: Self-pay

## 2020-06-20 ENCOUNTER — Encounter: Payer: Self-pay | Admitting: Physician Assistant

## 2020-06-20 ENCOUNTER — Ambulatory Visit (INDEPENDENT_AMBULATORY_CARE_PROVIDER_SITE_OTHER): Payer: BC Managed Care – PPO

## 2020-06-20 ENCOUNTER — Ambulatory Visit: Payer: BC Managed Care – PPO | Admitting: Physician Assistant

## 2020-06-20 VITALS — BP 104/78 | HR 80 | Ht 66.5 in | Wt 185.0 lb

## 2020-06-20 DIAGNOSIS — I493 Ventricular premature depolarization: Secondary | ICD-10-CM | POA: Diagnosis not present

## 2020-06-20 DIAGNOSIS — I2510 Atherosclerotic heart disease of native coronary artery without angina pectoris: Secondary | ICD-10-CM | POA: Diagnosis not present

## 2020-06-20 DIAGNOSIS — I10 Essential (primary) hypertension: Secondary | ICD-10-CM | POA: Diagnosis not present

## 2020-06-20 DIAGNOSIS — E785 Hyperlipidemia, unspecified: Secondary | ICD-10-CM | POA: Diagnosis not present

## 2020-06-20 DIAGNOSIS — F3174 Bipolar disorder, in full remission, most recent episode manic: Secondary | ICD-10-CM | POA: Diagnosis not present

## 2020-06-20 NOTE — Patient Instructions (Signed)
Medication Instructions:  - Your physician recommends that you continue on your current medications as directed. Please refer to the Current Medication list given to you today.  *If you need a refill on your cardiac medications before your next appointment, please call your pharmacy*   Lab Work: - none ordered  If you have labs (blood work) drawn today and your tests are completely normal, you will receive your results only by: Marland Kitchen MyChart Message (if you have MyChart) OR . A paper copy in the mail If you have any lab test that is abnormal or we need to change your treatment, we will call you to review the results.   Testing/Procedures:  1) ZIO XT heart monitor- x 3 days   - Your physician has recommended that you wear a Zio XT monitor (to be placed in office today).   This monitor is a medical device that records the heart's electrical activity. Doctors most often use these monitors to diagnose arrhythmias. Arrhythmias are problems with the speed or rhythm of the heartbeat. The monitor is a small device applied to your chest. You can wear one while you do your normal daily activities. While wearing this monitor if you have any symptoms to push the button and record what you felt. Once you have worn this monitor for the period of time provider prescribed (Usually 14 days), you will return the monitor device in the postage paid box. Once it is returned they will download the data collected and provide [place] with a report which the provider will then review and we will call you with those results. Important tips:  1. Avoid showering during the first 24 hours of wearing the monitor. 2. Avoid excessive sweating to help maximize wear time. 3. Do not submerge the device, no hot tubs, and no swimming pools. 4. Keep any lotions or oils away from the patch. 5. After 24 hours you may shower with the patch on. Take brief showers with your back facing the shower head.  6. Do not remove patch once it has  been placed because that will interrupt data and decrease adhesive wear time. 7. Push the button when you have any symptoms and write down what you were feeling. 8. Once you have completed wearing your monitor, remove and place into box which has postage paid and place in your outgoing mailbox.  9. If for some reason you have misplaced your box then call our office and we can provide another box and/or mail it off for you.    Follow-Up: At [facility], you and your health needs are our priority.  As part of our continuing mission to provide you with exceptional heart care, we have created designated Provider Care Teams.  These Care Teams include your primary Cardiologist (physician) and Advanced Practice Providers (APPs -  Physician Assistants and Nurse Practitioners) who all work together to provide you with the care you need, when you need it.  We recommend signing up for the patient portal called "MyChart".  Sign up information is provided on this After Visit Summary.  MyChart is used to connect with patients for Virtual Visits (Telemedicine).  Patients are able to view lab/test results, encounter notes, upcoming appointments, etc.  Non-urgent messages can be sent to your provider as well.   To learn more about what you can do with MyChart, go to [email or web address].    Your next appointment:   1 month(s)  The format for your next appointment:   In Person  Provider:  You may see Ida Rogue, MD or one of the following Advanced Practice Providers on your designated Care Team:    Murray Hodgkins, NP  Christell Faith, PA-C  Marrianne Mood, PA-C  Cadence [place], [place]  Laurann Montana, NP    Other Instructions n/a

## 2020-06-26 DIAGNOSIS — I493 Ventricular premature depolarization: Secondary | ICD-10-CM | POA: Diagnosis not present

## 2020-06-27 ENCOUNTER — Other Ambulatory Visit: Payer: Self-pay | Admitting: Cardiovascular Disease

## 2020-06-27 ENCOUNTER — Other Ambulatory Visit: Payer: Self-pay

## 2020-06-28 ENCOUNTER — Other Ambulatory Visit: Payer: Self-pay

## 2020-06-28 MED ORDER — CLOPIDOGREL BISULFATE 75 MG PO TABS: 75.0000 mg | ORAL_TABLET | Freq: Every day | ORAL | 3 refills | Status: DC

## 2020-06-29 ENCOUNTER — Other Ambulatory Visit: Payer: Self-pay

## 2020-06-29 DIAGNOSIS — E1169 Type 2 diabetes mellitus with other specified complication: Secondary | ICD-10-CM

## 2020-06-29 DIAGNOSIS — E669 Obesity, unspecified: Secondary | ICD-10-CM

## 2020-06-29 MED ORDER — LUBIPROSTONE 24 MCG PO CAPS: 24.0000 ug | ORAL_CAPSULE | Freq: Two times a day (BID) | ORAL | 3 refills | Status: DC

## 2020-06-29 MED ORDER — EMPAGLIFLOZIN 25 MG PO TABS: ORAL_TABLET | ORAL | 3 refills | Status: DC

## 2020-07-03 ENCOUNTER — Telehealth: Payer: Self-pay

## 2020-07-03 NOTE — Telephone Encounter (Signed)
Amitiza sent in to replace Linzess which was no longer covered by patient's insurance.  Amitiza requires a PA.  Submitted through Cover My Meds.  Awaiting response.  Let patient know through mychart.

## 2020-07-10 ENCOUNTER — Other Ambulatory Visit: Payer: Self-pay | Admitting: Internal Medicine

## 2020-07-11 MED ORDER — LUBIPROSTONE 24 MCG PO CAPS: 24.0000 ug | ORAL_CAPSULE | Freq: Two times a day (BID) | ORAL | 3 refills | Status: DC

## 2020-07-11 NOTE — Telephone Encounter (Signed)
Amitiza PA approved - resent rx to pharmacy with this information

## 2020-07-12 ENCOUNTER — Ambulatory Visit: Payer: BC Managed Care – PPO | Admitting: Internal Medicine

## 2020-07-12 ENCOUNTER — Encounter: Payer: Self-pay | Admitting: Internal Medicine

## 2020-07-12 VITALS — BP 130/72 | HR 88 | Ht 66.0 in | Wt 183.8 lb

## 2020-07-12 DIAGNOSIS — K5900 Constipation, unspecified: Secondary | ICD-10-CM | POA: Diagnosis not present

## 2020-07-12 DIAGNOSIS — K219 Gastro-esophageal reflux disease without esophagitis: Secondary | ICD-10-CM | POA: Diagnosis not present

## 2020-07-12 DIAGNOSIS — Z7902 Long term (current) use of antithrombotics/antiplatelets: Secondary | ICD-10-CM | POA: Diagnosis not present

## 2020-07-12 DIAGNOSIS — Z86010 Personal history of colonic polyps: Secondary | ICD-10-CM | POA: Diagnosis not present

## 2020-07-12 DIAGNOSIS — K5100 Ulcerative (chronic) pancolitis without complications: Secondary | ICD-10-CM

## 2020-07-12 NOTE — Patient Instructions (Signed)
If you are age 62 or older, your body mass index should be between 23-30. Your Body mass index is 29.67 kg/m. If this is out of the aforementioned range listed, please consider follow up with your Primary Care Provider.  If you are age 48 or younger, your body mass index should be between 19-25. Your Body mass index is 29.67 kg/m. If this is out of the aformentioned range listed, please consider follow up with your Primary Care Provider.   __________________________________________________________  The [facility] providers would like to encourage you to use [facility] to communicate with providers for non-urgent requests or questions.  Due to long hold times on the telephone, sending your provider a message by [facility] may be a faster and more efficient way to get a response.  Please allow 48 business hours for a response.  Please remember that this is for non-urgent requests.     Call the office when you are ready to schedule your colonoscopy

## 2020-07-12 NOTE — Progress Notes (Signed)
HISTORY OF PRESENT ILLNESS:  Lindsay Fry is a 60 y.o. female with MULTIPLE significant medical problems as listed below.  GI problems include GERD, chronic constipation, morbid obesity, history of chronic ulcerative colitis, and a history of multiple adenomatous and sessile serrated polyps for which she is under close surveillance.  Significant non-GI medical problems include diabetes mellitus, hypertension, obesity, arthritis, breast cancer, and coronary artery disease for which she has undergone prior coronary artery interventions.  Behavioral health issues include bipolar disorder and OCD.  Patient presents today regarding constipation and timing for surveillance colonoscopy.  Previously she was on Linzess.  This was removed from her drug formulary.  She was recently changed to Amitiza 24 mcg twice daily.  Took her first dose yesterday.  Prior to that using Peri-Colace and varying degrees of MiraLAX on a daily basis.  No bleeding.  Colonoscopy was performed May 14, 2018.  Random colon biopsies did not show any active colitis or dysplasia.  She did have diminutive tubular adenomas.  Follow-up in 1 year with extensive preparation recommended.  However, this has been postponed due to interval active medical problems including COVID and cardiac issues.  Recently she underwent coronary artery intervention with stent placement April 06, 2020.  Review of cardiology note states that they will keep patient on Plavix and aspirin for at least 12 months.  She is also having some issues with arrhythmia.  Her last hemoglobin in April 2022 was normal at 15.3.  Current GI medications include Colace, famotidine, loperamide (when she has issues with diarrhea-not recently), Amitiza, Lialda 2.4 g daily, Zofran, pantoprazole 40 mg twice daily, MiraLAX, simethicone.  She has been successful with intentional weight loss secondary to dietary change and water aerobics  REVIEW OF SYSTEMS:  All non-GI ROS negative unless  otherwise stated in the HPI except for anxiety, arthritis, back pain, cough, fatigue, fever, headaches, hearing problems, heart rhythm issues, urinary leakage, shortness of breath  Past Medical History:  Diagnosis Date   ADD (attention deficit disorder) 11/19/2012   Allergic rhinitis, cause unspecified    Anxiety state, unspecified    Arthritis    Asymptomatic postmenopausal status (age-related) (natural)    Atypical hyperplasia of left breast 2000   Atypical mole 03/16/2018   R paraspinal mid upper back, excision   Atypical mole 02/17/2018   R spinal upper back, moderate   Atypical mole 08/11/2017   L sternum, excision   Atypical mole 05/27/2017   L post neck, moderate   Benign neoplasm of other and unspecified site of the digestive system    Bipolar disorder ([place])    Breast cancer ([place])    Left Breast 32m invasive CA left uiq, dcis left uoq and a lot ADH, ALH in both breasts.   CAD (coronary artery disease)    a. 08/2014 NSTEMI/Cath: LM nl, LAD 50p, D1/D2 min irregs, LCX nl, OM1 40, LPDA nl, RI 99ost (2.015mvessel->med Rx), RCA nl, AM 60, nl EF; b. 11/2016 MV: EF 74%, no ischemia (performed 2/2 dyspnea & new inf TWI).   [place]   Cataract    Chronic diastolic CHF (congestive heart failure) ([place]   a. 08/2014 Echo: EF 60-65%, Gr 1 DD, nl LA.   Clotting disorder ([place]   on blood thiner, Plavix   Cyst    on Achilles tendon   Depression    Diabetes mellitus ([facility]   frank   Dysuria    Edema    Family  history of malignant neoplasm of gastrointestinal tract    Fibromyalgia    GERD (gastroesophageal reflux disease)    Glaucoma    pre glaucoma, 05/14/2018 pt. denies glaucoma   Headache    migraines   Hiatal hernia    History of alcoholism ([place])    History of migraines    History of ovarian cyst    Hx of adenomatous colonic polyps    Insomnia, unspecified    Myocardial infarction ([place]) 09-04-14   OCD (obsessive compulsive disorder)     Osteopenia 09/25/2016   Femoral neck T -1.1  9/18   Other screening mammogram    Pure hypercholesterolemia    Rosacea    Subacute confusional state 11/19/2012   Tubular adenoma of colon 01/10/11   Ulcerative colitis, unspecified    Unspecified asthma(493.90)    Unspecified essential hypertension    Unspecified hypothyroidism    Unspecified vitamin D deficiency    Vertigo     Past Surgical History:  Procedure Laterality Date   APPENDECTOMY     AXILLARY SENTINEL NODE BIOPSY Left 05/23/2014   Procedure: AXILLARY SENTINEL NODE BIOPSY;  Surgeon: Christene Lye, MD;  Location: [facility] ORS;  Service: General;  Laterality: Left;   BREAST BIOPSY  08/1998   on tamoxifen, atypical hyperplasia   BREAST BIOPSY Left 04/2014   BREAST SURGERY Left 08/1998   lumpectomy/ Dr Sharlet Salina   BREAST SURGERY Bilateral 05/23/2014   Mastectomy   BUNIONECTOMY Right    CARDIAC CATHETERIZATION N/A 09/05/2014   Procedure: Left Heart Cath and Coronary Angiography;  Surgeon: Wellington Hampshire, MD;  Location: [facility];  Service: Cardiovascular;  Laterality: N/A;   CARDIAC CATHETERIZATION  09/05/2014   CATARACT EXTRACTION W/ INTRAOCULAR LENS IMPLANT Bilateral    CESAREAN SECTION  1996/1997   placenta previa, gest DM, pre-eclampsia   CHOLECYSTECTOMY  1997   adhesions also   COLONOSCOPY  01/2001   Ulcerative colitis   COLONOSCOPY  04/2004   UC, polyp   COLONOSCOPY  12/2006   UC, no polyps   CORONARY STENT INTERVENTION N/A 04/06/2020   Procedure: CORONARY STENT INTERVENTION;  Surgeon: Nelva Bush, MD;  Location: [facility];  Service: Cardiovascular;  Laterality: N/A;   DEXA  04/1999 and 2010   normal   ESOPHAGOGASTRODUODENOSCOPY  09/2001   polyp   exercise stress test  11/2003   negative   FEMUR FRACTURE SURGERY Left    LEFT HEART CATH AND CORONARY ANGIOGRAPHY N/A 04/06/2020   Procedure: LEFT HEART CATH AND CORONARY ANGIOGRAPHY;  Surgeon: Minna Merritts, MD;  Location: [facility];  Service: Cardiovascular;  Laterality: N/A;   MASTECTOMY Bilateral    MECKEL DIVERTICULUM EXCISION  1999   NASAL SINUS SURGERY  05/1997   nuclear stress test  06/2007   negative   precancerous mole removed     RIGHT AND LEFT HEART CATH     with 1 stent placed in March 2022   SIMPLE MASTECTOMY WITH AXILLARY SENTINEL NODE BIOPSY Bilateral 05/23/2014   Procedure: Bilateral simple mastectomy, left sentinel node biopsy ;  Surgeon: Christene Lye, MD;  Location: [facility];  Service: General;  Laterality: Bilateral;   Sleep study  11/2007   no apnea, but did snore (done by [facility])   [place] EXTRACTION  1980's    Social History DAO MEARNS  reports that she quit smoking about 27 years ago. Her  smoking use included cigarettes. She has a 1.25 pack-year smoking history. She has never used smokeless tobacco. She reports that she does not drink alcohol and does not use drugs.  family history includes Alzheimer's disease in her father; Breast cancer in an other family member; Colon cancer in her father; Colon polyps in her father; Coronary artery disease in her father, mother, and another family member; Crohn's disease in her mother; Dementia in her father; Diabetes in an other family member; Hypertension in her mother; Osteoporosis in her mother; Stroke in her cousin.  Allergies  Allergen Reactions   Ephedrine Other (See Comments)    Pt becomes hyper   Aspirin     REACTION: aggrivates colitis   Crestor [Rosuvastatin]     REACTION: increased lfts   Erythromycin     REACTION: GI upset   Nsaids     REACTION: aggrivate colitis   Oxycodone Other (See Comments)    Panic Attack       PHYSICAL EXAMINATION: Vital signs: BP 130/72   Pulse 88   Ht 5' 6"  (1.676 m)   Wt 183 lb 12.8 oz (83.4 kg)   LMP 08/23/2006 (Approximate) Comment: tubal ligation  SpO2 98%   BMI 29.67 kg/m   Constitutional: Pleasant, chronically  ill-appearing, no acute distress Psychiatric: alert and oriented x3, cooperative Eyes: extraocular movements intact, anicteric, conjunctiva pink Mouth: oral pharynx moist, no lesions Neck: supple no lymphadenopathy Cardiovascular: heart regular rate and rhythm, no murmur Lungs: clear to auscultation bilaterally Abdomen: soft, nontender, nondistended, no obvious ascites, no peritoneal signs, normal bowel sounds, no organomegaly Rectal: Omitted Extremities: no clubbing, cyanosis.  No lower extremity edema bilaterally Skin: no lesions on visible extremities Neuro: No focal deficits.  Cranial nerves intact  ASSESSMENT:  1.  History of ulcerative colitis, adenomatous colon polyps, and sessile serrated polyps.  Slightly overdue for surveillance.  Last colonoscopy 2020.  Asymptomatic. 2.  Chronic constipation.  On multiple agents as noted 3.  GERD.  Twice daily PPI and H2 receptor antagonist therapy 4.  Obesity.  Has been losing weight 5.  Multiple general medical problems including coronary artery disease with recent coronary artery intervention and the need for uninterrupted Plavix/aspirin therapy   PLAN:  1.  We will plan on reevaluating her after her 1 year anniversary of her stent placement.  We would like to perform her colonoscopy with short-term interruption of Plavix given the need for multiple biopsies and a history of multiple polyps.  She understands. 2.  Continue PPI and H2 receptor antagonist therapy 3.  Continue reflux precautions 4.  Continue weight loss.  Good job. 5.  Therapies for constipation.  Adjust as needed A total time of 30 minutes was spent preparing to see the patient, reviewing tests and outside evaluations, obtaining comprehensive history, performing medically appropriate physical examination, counseling the patient regarding her multiple above listed issues, ordering medications and arranging follow-up, and documenting clinical information in the health  record

## 2020-07-13 ENCOUNTER — Telehealth: Payer: Self-pay | Admitting: *Deleted

## 2020-07-13 DIAGNOSIS — I493 Ventricular premature depolarization: Secondary | ICD-10-CM

## 2020-07-13 NOTE — Telephone Encounter (Signed)
No answer/voicemail box is full on patients number. Called spouse number and left message to have her call back for review of results.

## 2020-07-13 NOTE — Telephone Encounter (Signed)
-----   Message from Rise Mu, PA-C sent at 07/10/2020  7:14 AM EDT ----- Heart monitor showed a predominant rhythm of sinus with an average rate of 86 bpm (range 61 to 124 bpm), rare extra beats from the top portion of the heart with frequent extra beats from the bottom portion of the heart representing 16.1% of all beats.  Given relative hypotension unable to escalate beta-blockade further.  Recent magnesium and potassium at goal with normal thyroid function.  Please refer the patient to EP for further management of frequent PVCs.

## 2020-07-19 NOTE — Telephone Encounter (Signed)
No answer/Voicemail box is full. Attempted to call home number and No answer/Left voicemail message to call back.

## 2020-07-20 NOTE — Telephone Encounter (Signed)
Reviewed results and recommendations with patient. She verbalized understanding and discussed EP referral. Advised I would put in order and that someone will call to set up that appointment. She verbalized understanding of our conversation, agreement with plan and had no further questions at this time.

## 2020-07-21 NOTE — Telephone Encounter (Signed)
Left VM to schedule

## 2020-07-22 NOTE — Progress Notes (Deleted)
Cardiology Office Note    Date:  07/22/2020   ID:  Fry, Lindsay 30-Aug-1960, MRN [id number]  PCP:  Abner Greenspan, MD  Cardiologist:  Ida Rogue, MD  Electrophysiologist:  None   Chief Complaint: Follow-up  History of Present Illness:   Lindsay Fry is a 60 y.o. female with history of CAD with NSTEMI in 08/2014 with recent PCI to Lindsay Fry in 03/2020, fibromyalgia, frequent PVCs, DM2, ulcerative colitis, Covid infection in 02/2020, HTN, HLD, left-sided breast cancer diagnosed in 2000 status post bilateral mastectomy, obesity, migraine disorder, chronic back pain, GERD, bipolar disorder, and anxiety who presents for follow up of recent Zio patch.   She was admitted to the hospital in 08/2014 with a NSTEMI.  Cath at that time showed severe ostial ramus intermedius disease which was medically managed secondary to being a small vessel approximately 2 mm in diameter.  She otherwise had nonobstructive disease.  Echo showed an EF of 60 to 65%, no regional wall motion abnormalities, grade 1 diastolic dysfunction, no significant valvular abnormalities, normal RV systolic function, normal PASP.  Lower extremity ABIs in 09/2014 were normal.  Zio patch from 01/2016 showed a predominant rhythm of sinus with an average heart rate of 76 bpm (range 56 to 133 bpm), first-degree AV block was present, rare PACs, atrial couplets, PVCs, and ventricular triplets.  No significant arrhythmias were noted.  She was seen in 10/2016 with reported dyspnea on exertion and noted to have new anterior T wave changes.  Subsequent nuclear stress test was nonischemic.  She developed gait instability and memory disturbance late 2020 and was evaluated by neurology 01/2019.  MRI of the brain 02/2019 was unrevealing.  Carotid Dopplers normal 08/12/2019.  She was evaluated in late 2021 with dizziness and vertigo with symptoms improving with meclizine.  There was also some concern polypharmacy was contributing.  She was seen in the ED in  02/2020 with bradycardia, dyspnea, and presyncope with associated chest pain.  She was significantly hypokalemic with a potassium of 2.9.  High-sensitivity troponin negative.  She had mistakenly taken an extra olmesartan.  Echo in 03/2020 showed an EF of 50 to 55%, no regional wall motion abnormalities, normal internal LV cavity size, grade 1 diastolic dysfunction, normal RV systolic function, and frequent PVCs were noted.  Zio patch showed a predominant rhythm of sinus with an average heart rate of 96 bpm with a range of 72 to 187 bpm, 1 run of NSVT lasting 5 beats, 1 run of SVT lasting 9 beats, rare PACs, frequent PVCs representing a 26.7% burden.  Coronary CTA in 03/2020 showed a calcium score of 105 which was the 88th percentile, occluded proximal ramus that appeared to be chronic, and 25 to 49% stenosis involving the mid LAD with recommendation to proceed with cardiac cath.  She underwent cardiac cath on 04/06/2020 which showed severe single-vessel CAD with 95% OM2 stenosis and CTO of the ramus intermedius/OM1 with noncritical disease of the LAD/diagonal branch is also being present.  She underwent successful PCI to the proximal OM2.   She contacted the office in 05/2020, noting symptoms of lightheadedness with BP of 85/54 upon standing with a heart rate of 114 bpm.  She was concerned this was related to olmesartan.  She was seen in the office on 06/06/2020 and was doing well from a cardiac perspective.  Since undergoing PCI she has not had any further chest pain, and she was tolerating DAPT without issues.  BP log from home showed  blood pressure had largely been reasonably controlled with an occasional elevated reading or soft reading.  Due to elevated readings, she increased her olmesartan from 5 mg to 10 mg in mid May.  She was drinking approximately 4 cups of coffee per day and would sometimes drink Coke Zeros as well.  With noted significant PVC burden, she was started on Toprol XL 12.5 mg nightly, and  olmesartan was decreased to 5 mg.  She was advised to decrease caffeine intake and increase water.  She was last seen in the office in 06/2020 and continued to do well from a cardiac perspective.  She tolerated the addition of metoprolol without issues.  Blood pressure was reasonably controlled.  EKG continued to show frequent PVCs, and a pattern of ventricular bigeminy.  She underwent repeat outpatient cardiac monitoring to quantify PVC burden on maximally tolerated beta-blocker which demonstrated a predominant rhythm of sinus with an average heart rate of 86 bpm (range 61 to 124 bpm) and frequent PVCs representing a 16.1% burden.  Given continued frequent PVC burden, on maximally tolerated metoprolol, she was referred to EP for further management of frequent PVCs.  ***     Labs independently reviewed:  06/2020 - TC 122, TG 106, HDL 45, LDL 55 05/2020 - magnesium 2.4, potassium 4.6, BUN 23, serum creatinine 0.94, albumin 4.6, AST normal, ALT 45, A1c 5.9, TSH normal 04/2020 - Hgb 15.3, PLT 302 02/2020 - TC 120, TG 172, HDL 40, LDL 46    Past Medical History:  Diagnosis Date   ADD (attention deficit disorder) 11/19/2012   Allergic rhinitis, cause unspecified    Anxiety state, unspecified    Arthritis    Asymptomatic postmenopausal status (age-related) (natural)    Atypical hyperplasia of left breast 2000   Atypical mole 03/16/2018   R paraspinal mid upper back, excision   Atypical mole 02/17/2018   R spinal upper back, moderate   Atypical mole 08/11/2017   L sternum, excision   Atypical mole 05/27/2017   L post neck, moderate   Benign neoplasm of other and unspecified site of the digestive system    Bipolar disorder ([place])    Breast cancer ([place])    Left Breast 90m invasive CA left uiq, dcis left uoq and a lot ADH, ALH in both breasts.   CAD (coronary artery disease)    a. 08/2014 NSTEMI/Cath: LM nl, LAD 50p, D1/D2 min irregs, LCX nl, OM1 40, LPDA nl, RI 99ost (2.063mvessel->med Rx), RCA  nl, AM 60, nl EF; b. 11/2016 MV: EF 74%, no ischemia (performed 2/2 dyspnea & new inf TWI).   Carcinoma of [place] breast ([place]   Cataract    Chronic diastolic CHF (congestive heart failure) ([place]   a. 08/2014 Echo: EF 60-65%, Gr 1 DD, nl LA.   Clotting disorder ([place]   on blood thiner, Plavix   Cyst    on Achilles tendon   Depression    Diabetes mellitus ([place]   frank   Dysuria    Edema    Family history of malignant neoplasm of gastrointestinal tract    Fibromyalgia    GERD (gastroesophageal reflux disease)    Glaucoma    pre glaucoma, 05/14/2018 pt. denies glaucoma   Headache    migraines   Hiatal hernia    History of alcoholism ([place]   History of migraines    History of ovarian cyst    Hx of adenomatous colonic polyps    Insomnia, unspecified  Myocardial infarction ([place]) 09-04-14   OCD (obsessive compulsive disorder)    Osteopenia 09/25/2016   Femoral neck T -1.1  9/18   Other screening mammogram    Pure hypercholesterolemia    Rosacea    Subacute confusional state 11/19/2012   Tubular adenoma of colon 01/10/11   Ulcerative colitis, unspecified    Unspecified asthma(493.90)    Unspecified essential hypertension    Unspecified hypothyroidism    Unspecified vitamin D deficiency    Vertigo     Past Surgical History:  Procedure Laterality Date   APPENDECTOMY     AXILLARY SENTINEL NODE BIOPSY Left 05/23/2014   Procedure: AXILLARY SENTINEL NODE BIOPSY;  Surgeon: Christene Lye, MD;  Location: [facility]S;  Service: General;  Laterality: Left;   BREAST BIOPSY  08/1998   on tamoxifen, atypical hyperplasia   BREAST BIOPSY Left 04/2014   BREAST SURGERY Left 08/1998   lumpectomy/ Dr Sharlet Salina   BREAST SURGERY Bilateral 05/23/2014   Mastectomy   BUNIONECTOMY Right    CARDIAC CATHETERIZATION N/A 09/05/2014   Procedure: Left Heart Cath and Coronary Angiography;  Surgeon: Wellington Hampshire, MD;  Location: [place] [facility];  Service: Cardiovascular;   Laterality: N/A;   CARDIAC CATHETERIZATION  09/05/2014   CATARACT EXTRACTION W/ INTRAOCULAR LENS IMPLANT Bilateral    CESAREAN SECTION  1996/1997   placenta previa, gest DM, pre-eclampsia   CHOLECYSTECTOMY  1997   adhesions also   COLONOSCOPY  01/2001   Ulcerative colitis   COLONOSCOPY  04/2004   UC, polyp   COLONOSCOPY  12/2006   UC, no polyps   CORONARY STENT INTERVENTION N/A 04/06/2020   Procedure: CORONARY STENT INTERVENTION;  Surgeon: Nelva Bush, MD;  Location: [place] [facility];  Service: Cardiovascular;  Laterality: N/A;   DEXA  04/1999 and 2010   normal   ESOPHAGOGASTRODUODENOSCOPY  09/2001   polyp   exercise stress test  11/2003   negative   FEMUR FRACTURE SURGERY Left    LEFT HEART CATH AND CORONARY ANGIOGRAPHY N/A 04/06/2020   Procedure: LEFT HEART CATH AND CORONARY ANGIOGRAPHY;  Surgeon: Minna Merritts, MD;  Location: [place] [facility];  Service: Cardiovascular;  Laterality: N/A;   MASTECTOMY Bilateral    MECKEL DIVERTICULUM EXCISION  1999   NASAL SINUS SURGERY  05/1997   nuclear stress test  06/2007   negative   precancerous mole removed     RIGHT AND LEFT HEART CATH     with 1 stent placed in March 2022   SIMPLE MASTECTOMY WITH AXILLARY SENTINEL NODE BIOPSY Bilateral 05/23/2014   Procedure: Bilateral simple mastectomy, left sentinel node biopsy ;  Surgeon: Christene Lye, MD;  Location: [facility];  Service: General;  Laterality: Bilateral;   Sleep study  11/2007   no apnea, but did snore (done by [facility])   [place] EXTRACTION  1980's    Current Medications: No outpatient medications have been marked as taking for the 07/27/20 encounter (Appointment) with Rise Mu, PA-C.    Allergies:   Ephedrine, Aspirin, Crestor [rosuvastatin], Erythromycin, Nsaids, and Oxycodone   Social History   Socioeconomic History   Marital status: Married    Spouse name: alan   Number of children: 2    Years of education: college   Highest education level: Not on file  Occupational History   Occupation: unemployed  Tobacco Use   Smoking status: Former    Packs/day: 0.25  Years: 5.00    Pack years: 1.25    Types: Cigarettes    Quit date: 01/14/1993    Years since quitting: 27.5   Smokeless tobacco: Never  Vaping Use   Vaping Use: Never used  Substance and Sexual Activity   Alcohol use: No    Alcohol/week: 0.0 standard drinks    Comment: Recovered ETOH   Drug use: No   Sexual activity: Not Currently    Partners: Male  Other Topics Concern   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      [employer] telephone triage   Social Determinants of Health   Financial Resource Strain: Not on file  Food Insecurity: Not on file  Transportation Needs: Not on file  Physical Activity: Not on file  Stress: Not on file  Social Connections: Not on file     Family History:  The patient's family history includes Alzheimer's disease in her father; Breast cancer in an other family member; Colon cancer in her father; Colon polyps in her father; Coronary artery disease in her father, mother, and another family member; Crohn's disease in her mother; Dementia in her father; Diabetes in an other family member; Hypertension in her mother; Osteoporosis in her mother; Stroke in her cousin. There is no history of Esophageal cancer, Rectal cancer, or Stomach cancer.  ROS:   ROS   EKGs/Labs/Other Studies Reviewed:    Studies reviewed were summarized above. The additional studies were reviewed today:  Zio patch 06/2020: Patient had a min HR of 61 bpm, max HR of 124 bpm, and avg HR of 86 bpm. Predominant underlying rhythm was Sinus Rhythm.   No Isolated SVEs, SVE Couplets, or SVE Triplets were present. Isolated VEs were frequent (16.1%, [id number]), VE Couplets were rare (<1.0%, 226), and no VE Triplets were  present. Ventricular Bigeminy and Trigeminy were present.   No patient triggered events recorded __________  Elwyn Reach patch 03/2020: Sinus rhythm, min HR of 72 bpm, max HR of 187 bpm, and avg HR of 96 bpm.   1 run of Ventricular Tachycardia occurred lasting 5 beats with a max rate of 187 bpm (avg 131 bpm).   1 run of Supraventricular Tachycardia occurred lasting 9 beats with a max rate of 184 bpm (avg 162 bpm).   Isolated SVEs were rare (<1.0%), and no SVE Couplets or SVE Triplets were present. Isolated VEs were frequent (26.7%, [id number]), VE Couplets were rare (<1.0%, 1250), and VE Triplets were rare (<1.0%, 38). Ventricular Bigeminy and Trigeminy were present. __________   [facility] 04/06/2020: Conclusions: Severe single-vessel coronary artery disease with 95% OM 2 stenosis and chronic total occlusion of ramus intermedius/OM1, as detailed in Dr. Donivan Scull diagnostic catheterization note.  Noncritical disease of the LAD/diagonal branches is also present. Successful PCI to proximal OM2 using Resolute Onyx 2.75 x 12 mm drug-eluting stent with 0% residual stenosis and TIMI-3 flow.   Conclusions: Favor dual antiplatelet therapy with aspirin and clopidogrel for up to 12 months.  Given questionable intolerance of aspirin I would favor trying for at least 1 month of DAPT before switching to clopidogrel alone. Obtain cytochrome P450 2C19 genotype to ensure appropriate response to clopidogrel. Aggressive secondary prevention. Overnight observation with plans for discharge home tomorrow if no post catheterization complications. __________   Coronary CTA 03/30/2020: Aorta: Normal size. Minimal aortic root wall  calcifications. No dissection.   Aortic Valve:  Trileaflet.  No calcifications.   Coronary Arteries:  Normal coronary origin.  left dominance.   RCA is a non dominant artery, there is no plaque.   Left main is a large artery that gives rise to LAD, ramus and LCX arteries.   The Ramus artery  appears to be chronically occluded   LAD is has calcified plaque in the proximal to mid segment causing mild 25-49% stenosis.   LCX is a dominant artery with mild calcifications in the mid vessel causing minimal stenosis (<25%), at the bifurcation into OM1 branch. It continues to supply the PDA.   Other findings:   Normal pulmonary vein drainage into the left atrium.   Normal left atrial appendage without a thrombus.   Normal size of the pulmonary artery.   IMPRESSION: 1. Coronary calcium score of 105. This was 88th percentile for age and sex matched control. 2. Normal coronary origin with left dominance. 3. Mild stenosis in the mid LAD (25-49%) 4. Total occlusion (100%) of Ramus branch proximally, appears chronic. 5. CAD-RADS 5 Total coronary occlusion (100%). Consider cardiac catheterization or viability assessment. Consider symptom-guided anti-ischemic pharmacotherapy as well as risk factor modification per guideline directed care. 6. Study quality degraded by motion artifacts __________   2D echo 03/16/2020: 1. Left ventricular ejection fraction, by estimation, is 50 to 55%. The  left ventricle has low normal function. The left ventricle has no regional  wall motion abnormalities. Left ventricular diastolic parameters are  consistent with Grade I diastolic  dysfunction (impaired relaxation).   2. Right ventricular systolic function is normal. The right ventricular  size is normal.   3. Frequent PVCs  __________   Event monitor 01/2016: Normal sinus rhythm Min HR of 56 bpm, max HR of 133 bpm, and avg HR of 76 bpm.   First Degree AV Block was present. Isolated SVEs were rare (<1.0%), SVE Couplets were rare (<1.0%), and no SVE Triplets were present. Isolated VEs were rare (<1.0%, 766), VE Triplets were rare (<1.0%, 1), and no VE Couplets were present. __________   2D echo 09/05/2014: - Left ventricle: The cavity size was normal. Systolic function was    normal. The  estimated ejection fraction was in the range of 60%    to 65%. Wall motion was normal; there were no regional wall    motion abnormalities. Doppler parameters are consistent with    abnormal left ventricular relaxation (grade 1 diastolic    dysfunction).  - Left atrium: The atrium was normal in size.  - Right ventricle: Systolic function was normal.  - Pulmonary arteries: Systolic pressure was within the normal    range.   Impressions:   - Normal study. ___________   LHC 09/05/2014: Acute Mrg lesion, 60% stenosed. Ost Ramus to Ramus lesion, 99% stenosed. Prox LAD lesion, 50% stenosed. 1st Mrg lesion, 40% stenosed.   1. Severe one-vessel coronary artery disease. The culprit for myocardial infarction this subtotal occlusion of the ostial ramus. This vessel is medium size and the diameter is about 2 mm. 2. Normal LV systolic function and mildly elevated left ventricular end-diastolic pressure.   Recommendations: The ostial ramus stenosis is in a location where placing a stent might compromise the flow to the LAD or left circumflex. Doing balloon angioplasty alone would probably not have good long-term results given that the diameter is about 2 mm. Thus, I think the best option is medical therapy for now. I increased the dose of atorvastatin  and placed the patient on Brilinta to be taken for one year. Recommend aggressive control of risk factors and blood pressure control. __________   Nuclear stress test 09/07/2012: No significant ischemia, EF 58%, low risk scan   EKG:  EKG is ordered today.  The EKG ordered today demonstrates ***  Recent Labs: 05/01/2020: Hemoglobin 15.3; Platelets 302 06/05/2020: ALT 45; BUN 23; Creatinine, Ser 0.94; Potassium 4.6; Sodium 145; TSH 3.12 06/06/2020: Magnesium 2.4  Recent Lipid Panel    Component Value Date/Time   CHOL 122 06/14/2020 1046   TRIG 106.0 06/14/2020 1046   HDL 45.80 06/14/2020 1046   CHOLHDL 3 06/14/2020 1046   VLDL 21.2 06/14/2020  1046   LDLCALC 55 06/14/2020 1046   LDLDIRECT 125.0 02/07/2020 1134    PHYSICAL EXAM:    VS:  LMP 08/23/2006 (Approximate) Comment: tubal ligation  BMI: There is no height or weight on file to calculate BMI.  Physical Exam  Wt Readings from Last 3 Encounters:  07/12/20 183 lb 12.8 oz (83.4 kg)  06/20/20 185 lb (83.9 kg)  06/14/20 187 lb 3.2 oz (84.9 kg)     ASSESSMENT & PLAN:   CAD involving the native coronary arteries without***angina:  Frequent PVCs:  HTN: Blood pressure ***  HLD: LDL 55 in 06/2020.  Disposition: F/u with Dr. Rockey Situ or an APP in ***.   Medication Adjustments/Labs and Tests Ordered: Current medicines are reviewed at length with the patient today.  Concerns regarding medicines are outlined above. Medication changes, Labs and Tests ordered today are summarized above and listed in the Patient Instructions accessible in Encounters.   Signed, Christell Faith, PA-C 07/22/2020 12:49 PM     [address], [place] [id number] [phone]

## 2020-07-27 ENCOUNTER — Ambulatory Visit: Payer: BC Managed Care – PPO | Admitting: Physician Assistant

## 2020-07-28 NOTE — Telephone Encounter (Signed)
Appointment has been scheduled.

## 2020-08-16 ENCOUNTER — Telehealth: Payer: Self-pay | Admitting: Neurology

## 2020-08-16 ENCOUNTER — Ambulatory Visit: Payer: BC Managed Care – PPO | Admitting: Neurology

## 2020-08-16 NOTE — Telephone Encounter (Signed)
This patient canceled the same day of a revisit appointment today.

## 2020-08-18 DIAGNOSIS — Z03818 Encounter for observation for suspected exposure to other biological agents ruled out: Secondary | ICD-10-CM | POA: Diagnosis not present

## 2020-08-18 DIAGNOSIS — Z20822 Contact with and (suspected) exposure to covid-19: Secondary | ICD-10-CM | POA: Diagnosis not present

## 2020-08-18 NOTE — Telephone Encounter (Signed)
That is a standard magnesium supplement.  Okay to take a magnesium supplement.

## 2020-08-19 DIAGNOSIS — Z20822 Contact with and (suspected) exposure to covid-19: Secondary | ICD-10-CM | POA: Diagnosis not present

## 2020-08-23 DIAGNOSIS — Z20822 Contact with and (suspected) exposure to covid-19: Secondary | ICD-10-CM | POA: Diagnosis not present

## 2020-08-23 DIAGNOSIS — Z03818 Encounter for observation for suspected exposure to other biological agents ruled out: Secondary | ICD-10-CM | POA: Diagnosis not present

## 2020-08-26 DIAGNOSIS — E1169 Type 2 diabetes mellitus with other specified complication: Secondary | ICD-10-CM

## 2020-08-26 DIAGNOSIS — E669 Obesity, unspecified: Secondary | ICD-10-CM

## 2020-08-29 ENCOUNTER — Other Ambulatory Visit: Payer: Self-pay

## 2020-08-29 ENCOUNTER — Ambulatory Visit: Payer: BC Managed Care – PPO | Admitting: Dermatology

## 2020-08-29 DIAGNOSIS — Z86018 Personal history of other benign neoplasm: Secondary | ICD-10-CM

## 2020-08-29 DIAGNOSIS — D1800 Hemangioma unspecified site: Secondary | ICD-10-CM

## 2020-08-29 DIAGNOSIS — L814 Other melanin hyperpigmentation: Secondary | ICD-10-CM

## 2020-08-29 DIAGNOSIS — Z1283 Encounter for screening for malignant neoplasm of skin: Secondary | ICD-10-CM | POA: Diagnosis not present

## 2020-08-29 DIAGNOSIS — B351 Tinea unguium: Secondary | ICD-10-CM | POA: Diagnosis not present

## 2020-08-29 DIAGNOSIS — L738 Other specified follicular disorders: Secondary | ICD-10-CM

## 2020-08-29 DIAGNOSIS — D229 Melanocytic nevi, unspecified: Secondary | ICD-10-CM

## 2020-08-29 DIAGNOSIS — L219 Seborrheic dermatitis, unspecified: Secondary | ICD-10-CM

## 2020-08-29 DIAGNOSIS — D2221 Melanocytic nevi of right ear and external auricular canal: Secondary | ICD-10-CM

## 2020-08-29 DIAGNOSIS — L578 Other skin changes due to chronic exposure to nonionizing radiation: Secondary | ICD-10-CM

## 2020-08-29 DIAGNOSIS — L821 Other seborrheic keratosis: Secondary | ICD-10-CM

## 2020-08-29 DIAGNOSIS — D225 Melanocytic nevi of trunk: Secondary | ICD-10-CM

## 2020-08-29 MED ORDER — KETOCONAZOLE 2 % EX CREA: TOPICAL_CREAM | CUTANEOUS | 2 refills | Status: DC

## 2020-08-29 MED ORDER — TAVABOROLE 5 % EX SOLN: 1.0000 "application " | Freq: Every evening | CUTANEOUS | 6 refills | Status: DC

## 2020-08-29 MED ORDER — OZEMPIC (1 MG/DOSE) 4 MG/3ML ~~LOC~~ SOPN: 1.0000 mg | PEN_INJECTOR | SUBCUTANEOUS | 5 refills | Status: DC

## 2020-08-29 NOTE — Progress Notes (Signed)
Follow-Up Visit   Subjective  Lindsay Fry is a 60 y.o. female who presents for the following: Annual Exam (Patient here today for full body. Patient would like prescription of ketoconazole 2 % cream at corner of mouth and around nose and at forehead. Patient reports that her left great toenail is curved and notice a yellow. ).  Patient here for full body skin exam and skin cancer screening.   The following portions of the chart were reviewed this encounter and updated as appropriate:       Objective  Well appearing patient in no apparent distress; mood and affect are within normal limits.  A full examination was performed including scalp, head, eyes, ears, nose, lips, neck, chest, axillae, abdomen, back, buttocks, bilateral upper extremities, bilateral lower extremities, hands, feet, fingers, toes, fingernails, and toenails. All findings within normal limits unless otherwise noted below.  BL Ala Nasi, NL folds Pink scaliness  left great toenail Left great toenail thickened with subungual debris, nail polish is on, pt states it has yellow discoloration  right earlobe 5 mm brown macule   Right Lower Back 7 x 3 mm speckled medium dark brown papule - stable when compared to photos  right lower sternum 4 mm medium dark brown macule darker center- stable when compared to photos  spinal upper back 10 x 7 mm brown speckled papule - stable when compared to photos  Assessment & Plan  Seborrheic dermatitis BL Ala Nasi, NL folds  Seborrheic Dermatitis  -  is a chronic persistent rash characterized by pinkness and scaling most commonly of the mid face but also can occur on the scalp (dandruff), ears; mid chest and mid back. It tends to be exacerbated by stress and cooler weather.  People who have neurologic disease may experience new onset or exacerbation of existing seborrheic dermatitis.  The condition is not curable but treatable and can be controlled.  Continue ketoconazole 2%  cream qd/bid to aas face   ketoconazole (NIZORAL) 2 % cream - BL Ala Nasi, NL folds Apply to affected areas one to two times daily as needed.  Tinea unguium left great toenail  Chronic fungal infection of toenail Discussed oral terbinafine vrs topical treatment, pt prefers topical at this time Start Kerydin 5 % soln apply topically to affected toenail nightly. Sent to [facility]   If insurance will not cover will consider Jublia  If insurance will not cover jublia will consider Penlac nail laquer   Recommend that patient takes photo of nail when polish removed Can take up to year to see good result      Tavaborole (KERYDIN) 5 % SOLN - left great toenail Apply 1 application topically at bedtime. Apply to affected area of left great toenail  Nevus (4) right earlobe; right lower sternum; Right Lower Back; spinal upper back  Benign-appearing.  Stable. Observation.  Call clinic for new or changing lesions.  Recommend daily use of broad spectrum spf 30+ sunscreen to sun-exposed areas.    Lentigines - Scattered tan macules - Due to sun exposure - Benign-appering, observe - Recommend daily broad spectrum sunscreen SPF 30+ to sun-exposed areas, reapply every 2 hours as needed. - Call for any changes  Sebaceous Hyperplasia - Small yellow papules with a central dell  Has improved since cosmetic ED procedures  - Benign - Observe  Seborrheic Keratoses - Stuck-on, waxy, tan-brown papules and/or plaques  - Benign-appearing - Discussed benign etiology and prognosis. - Observe - Call for any changes  Melanocytic Nevi - Tan-brown and/or pink-flesh-colored symmetric macules and papules - Benign appearing on exam today, stable when compared to photos - Observation - Call clinic for new or changing moles - Recommend daily use of broad spectrum spf 30+ sunscreen to sun-exposed areas.   Hemangiomas - Red papules - Discussed benign nature - Observe - Call for any  changes  Actinic Damage - Chronic condition, secondary to cumulative UV/sun exposure - diffuse scaly erythematous macules with underlying dyspigmentation - Recommend daily broad spectrum sunscreen SPF 30+ to sun-exposed areas, reapply every 2 hours as needed.  - Staying in the shade or wearing long sleeves, sun glasses (UVA+UVB protection) and wide brim hats (4-inch brim around the entire circumference of the hat) are also recommended for sun protection.  - Call for new or changing lesions.  History of Dysplastic Nevi - No evidence of recurrence today at right paraspinal mid upper back, right spinal upper back, left sternum, left posterior neck - Recommend regular full body skin exams - Recommend daily broad spectrum sunscreen SPF 30+ to sun-exposed areas, reapply every 2 hours as needed.  - Call if any new or changing lesions are noted between office visits  Skin cancer screening performed today.  Return in about 6 months (around 03/01/2021) for follow up for toenail , 1 year tbse .  I, Ruthell Rummage, CMA, am acting as scribe for Brendolyn Patty, MD.  Documentation: I have reviewed the above documentation for accuracy and completeness, and I agree with the above.  Brendolyn Patty MD

## 2020-08-29 NOTE — Patient Instructions (Addendum)
Make sure to have photo of toenail for next follow up     Melanoma ABCDEs  Melanoma is the most dangerous type of skin cancer, and is the leading cause of death from skin disease.  You are more likely to develop melanoma if you: Have light-colored skin, light-colored eyes, or red or blond hair Spend a lot of time in the sun Tan regularly, either outdoors or in a tanning bed Have had blistering sunburns, especially during childhood Have a close family member who has had a melanoma Have atypical moles or large birthmarks  Early detection of melanoma is key since treatment is typically straightforward and cure rates are extremely high if we catch it early.   The first sign of melanoma is often a change in a mole or a new dark spot.  The ABCDE system is a way of remembering the signs of melanoma.  A for asymmetry:  The two halves do not match. B for border:  The edges of the growth are irregular. C for color:  A mixture of colors are present instead of an even brown color. D for diameter:  Melanomas are usually (but not always) greater than 49m - the size of a pencil eraser. E for evolution:  The spot keeps changing in size, shape, and color.  Please check your skin once per month between visits. You can use a small mirror in front and a large mirror behind you to keep an eye on the back side or your body.   If you see any new or changing lesions before your next follow-up, please call to schedule a visit.  Please continue daily skin protection including broad spectrum sunscreen SPF 30+ to sun-exposed areas, reapplying every 2 hours as needed when you're outdoors.   Staying in the shade or wearing long sleeves, sun glasses (UVA+UVB protection) and wide brim hats (4-inch brim around the entire circumference of the hat) are also recommended for sun protection.    If you have any questions or concerns for your doctor, please call our main line at [phone] press option 4 to reach  your doctor's medical assistant. If no one answers, please leave a voicemail as directed and we will return your call as soon as possible. Messages left after 4 pm will be answered the following business day.   You may also send [place] message via [place] We typically respond to MyChart messages within 1-2 business days.  For prescription refills, please ask your pharmacy to contact our office. Our fax number is [phone]  If you have an urgent issue when the clinic is closed that cannot wait until the next business day, you can page your doctor at the number below.    Please note that while we do our best to be available for urgent issues outside of office hours, we are not available 24/7.   If you have an urgent issue and are unable to reach [place] you may choose to seek medical care at your doctor's office, retail clinic, urgent care center, or emergency room.  If you have a medical emergency, please immediately call 911 or go to the emergency department.  Pager Numbers  - Dr. KNehemiah Massed [phone] - Dr. MLaurence Ferrari [phone] - Dr. SNicole Kindred [phone] In the event of inclement weather, please call our main line at [phone] an update on the status of any delays or closures.  Dermatology Medication Tips: Please keep the boxes that topical medications come in in order to  help keep track of the instructions about where and how to use these. Pharmacies typically print the medication instructions only on the boxes and not directly on the medication tubes.   If your medication is too expensive, please contact our office at [phone] option 4 or send [place] a message through [place].   We are unable to tell what your co-pay for medications will be in advance as this is different depending on your insurance coverage. However, we may be able to find a substitute medication at lower cost or fill out paperwork to get insurance to cover a needed medication.   If a prior authorization  is required to get your medication covered by your insurance company, please allow [place] 1-2 business days to complete this process.  Drug prices often vary depending on where the prescription is filled and some pharmacies may offer cheaper prices.  The website www.goodrx.com contains coupons for medications through different pharmacies. The prices here do not account for what the cost may be with help from insurance (it may be cheaper with your insurance), but the website can give you the price if you did not use any insurance.  - You can print the associated coupon and take it with your prescription to the pharmacy.  - You may also stop by our office during regular business hours and pick up a GoodRx coupon card.  - If you need your prescription sent electronically to a different pharmacy, notify our office through [facility] or by phone at [phone] option 4.

## 2020-08-30 ENCOUNTER — Ambulatory Visit: Payer: BC Managed Care – PPO | Admitting: Neurology

## 2020-08-30 ENCOUNTER — Encounter: Payer: Self-pay | Admitting: Neurology

## 2020-08-30 ENCOUNTER — Encounter: Payer: Self-pay | Admitting: Cardiology

## 2020-08-30 ENCOUNTER — Ambulatory Visit: Payer: BC Managed Care – PPO | Admitting: Cardiology

## 2020-08-30 VITALS — BP 119/67 | HR 50 | Ht 66.5 in | Wt 191.0 lb

## 2020-08-30 VITALS — BP 120/70 | HR 87 | Ht 66.25 in | Wt 190.8 lb

## 2020-08-30 DIAGNOSIS — M797 Fibromyalgia: Secondary | ICD-10-CM | POA: Diagnosis not present

## 2020-08-30 DIAGNOSIS — I2510 Atherosclerotic heart disease of native coronary artery without angina pectoris: Secondary | ICD-10-CM | POA: Diagnosis not present

## 2020-08-30 DIAGNOSIS — I252 Old myocardial infarction: Secondary | ICD-10-CM | POA: Diagnosis not present

## 2020-08-30 DIAGNOSIS — I10 Essential (primary) hypertension: Secondary | ICD-10-CM

## 2020-08-30 DIAGNOSIS — G3184 Mild cognitive impairment, so stated: Secondary | ICD-10-CM

## 2020-08-30 DIAGNOSIS — I493 Ventricular premature depolarization: Secondary | ICD-10-CM

## 2020-08-30 NOTE — Progress Notes (Signed)
Electrophysiology Office Note:    Date:  08/30/2020   ID:  Lindsay Fry, DOB Jul 10, 1960, MRN [id number]  PCP:  Abner Greenspan, MD  [facility] Cardiologist:  Ida Rogue, MD  [facility] Electrophysiologist:  Vickie Epley, MD   Referring MD: Rise Mu, PA-C   Chief Complaint: PVCs  History of Present Illness:    Lindsay Fry is a 60 y.o. female who presents for an evaluation of PVCs at the request of Christell Faith, PA-C. Their medical history includes bipolar disorder, coronary artery disease, chronic diastolic heart failure, diabetes, hypertension.  The patient was seen by Christell Faith, PA-C on June 20, 2020.  Her history of coronary artery disease dates back to 2016 when she had an NSTEMI.  She did have a recent PCI to an OM 2 in March 2022.  At the appointment with Christell Faith, she was tolerating DAPT.  Based on her history, she is reported intermittent dizziness.  She wore a heart monitor in March 2022 which showed frequent PVCs, 26.7%.  At the appointment with Christell Faith, she was experiencing bigeminal PVCs.  ZIO monitor was again applied in June 2022 which showed a PVC burden of 16.1%.  She tells me today that she is completely asymptomatic with her PVCs.  She has a good energy level.  She frequently goes to the pool to walk a mile in the water.   Past Medical History:  Diagnosis Date   ADD (attention deficit disorder) 11/19/2012   Allergic rhinitis, cause unspecified    Anxiety state, unspecified    Arthritis    Asymptomatic postmenopausal status (age-related) (natural)    Atypical hyperplasia of left breast 2000   Atypical mole 03/16/2018   R paraspinal mid upper back, excision   Atypical mole 02/17/2018   R spinal upper back, moderate   Atypical mole 08/11/2017   L sternum, excision   Atypical mole 05/27/2017   L post neck, moderate   Benign neoplasm of other and unspecified site of the digestive system    Bipolar disorder ([place])    Breast cancer ([place])     Left Breast 42m invasive CA left uiq, dcis left uoq and a lot ADH, ALH in both breasts.   CAD (coronary artery disease)    a. 08/2014 NSTEMI/Cath: LM nl, LAD 50p, D1/D2 min irregs, LCX nl, OM1 40, LPDA nl, RI 99ost (2.034mvessel->med Rx), RCA nl, AM 60, nl EF; b. 11/2016 MV: EF 74%, no ischemia (performed 2/2 dyspnea & new inf TWI).   Carcinoma of left breast ([facility]/19[place]2016   Cataract    Chronic diastolic CHF (congestive heart failure) ([place]   a. 08/2014 Echo: EF 60-65%, Gr 1 DD, nl LA.   Clotting disorder ([place]   on blood thiner, Plavix   Cyst    on Achilles tendon   Depression    Diabetes mellitus ([facility]   frank   Dysuria    Edema    Family history of malignant neoplasm of gastrointestinal tract    Fibromyalgia    GERD (gastroesophageal reflux disease)    Glaucoma    pre glaucoma, 05/14/2018 pt. denies glaucoma   Headache    migraines   Hiatal hernia    History of alcoholism ([place]   History of migraines    History of ovarian cyst    Hx of adenomatous colonic polyps    Insomnia, unspecified    Myocardial infarction ([facility]   OCD (obsessive compulsive disorder)  Osteopenia 09/25/2016   Femoral neck T -1.1  9/18   Other screening mammogram    Pure hypercholesterolemia    Rosacea    Subacute confusional state 11/19/2012   Tubular adenoma of colon 01/10/11   Ulcerative colitis, unspecified    Unspecified asthma(493.90)    Unspecified essential hypertension    Unspecified hypothyroidism    Unspecified vitamin D deficiency    Vertigo     Past Surgical History:  Procedure Laterality Date   APPENDECTOMY     AXILLARY SENTINEL NODE BIOPSY Left 05/23/2014   Procedure: AXILLARY SENTINEL NODE BIOPSY;  Surgeon: Christene Lye, MD;  Location: [facility] ORS;  Service: General;  Laterality: Left;   BREAST BIOPSY  08/1998   on tamoxifen, atypical hyperplasia   BREAST BIOPSY Left 04/2014   BREAST SURGERY Left 08/1998   lumpectomy/ Dr Sharlet Salina   BREAST SURGERY  Bilateral 05/23/2014   Mastectomy   BUNIONECTOMY Right    CARDIAC CATHETERIZATION N/A 09/05/2014   Procedure: Left Heart Cath and Coronary Angiography;  Surgeon: Wellington Hampshire, MD;  Location: [place] [facility];  Service: Cardiovascular;  Laterality: N/A;   CARDIAC CATHETERIZATION  09/05/2014   CATARACT EXTRACTION W/ INTRAOCULAR LENS IMPLANT Bilateral    CESAREAN SECTION  1996/1997   placenta previa, gest DM, pre-eclampsia   CHOLECYSTECTOMY  1997   adhesions also   COLONOSCOPY  01/2001   Ulcerative colitis   COLONOSCOPY  04/2004   UC, polyp   COLONOSCOPY  12/2006   UC, no polyps   CORONARY STENT INTERVENTION N/A 04/06/2020   Procedure: CORONARY STENT INTERVENTION;  Surgeon: Nelva Bush, MD;  Location: [place] [facility];  Service: Cardiovascular;  Laterality: N/A;   DEXA  04/1999 and 2010   normal   ESOPHAGOGASTRODUODENOSCOPY  09/2001   polyp   exercise stress test  11/2003   negative   FEMUR FRACTURE SURGERY Left    LEFT HEART CATH AND CORONARY ANGIOGRAPHY N/A 04/06/2020   Procedure: LEFT HEART CATH AND CORONARY ANGIOGRAPHY;  Surgeon: Minna Merritts, MD;  Location: [place] [facility];  Service: Cardiovascular;  Laterality: N/A;   MASTECTOMY Bilateral    MECKEL DIVERTICULUM EXCISION  1999   NASAL SINUS SURGERY  05/1997   nuclear stress test  06/2007   negative   precancerous mole removed     RIGHT AND LEFT HEART CATH     with 1 stent placed in March 2022   SIMPLE MASTECTOMY WITH AXILLARY SENTINEL NODE BIOPSY Bilateral 05/23/2014   Procedure: Bilateral simple mastectomy, left sentinel node biopsy ;  Surgeon: Christene Lye, MD;  Location: [facility] ORS;  Service: General;  Laterality: Bilateral;   Sleep study  11/2007   no apnea, but did snore (done by [facility])   [place] EXTRACTION  1980's    Current Medications: Current Meds  Medication Sig   acetaminophen (TYLENOL) 650 MG CR tablet Take 1,300 mg by  mouth 2 (two) times daily.   amantadine (SYMMETREL) 100 MG capsule Take 100 mg by mouth 2 (two) times daily.   Ascorbic Acid (VITAMIN C PO) Take 1,000 mg by mouth daily.   aspirin 81 MG chewable tablet Chew 1 tablet (81 mg total) by mouth daily.   atorvastatin (LIPITOR) 80 MG tablet Take one pill daily   B Complex Vitamins (VITAMIN B COMPLEX PO) Take 1 tablet by mouth every other day.   BAYER MICROLET LANCETS lancets USE AS DIRECTED TO  CHECK BLOOD SUGAR TWICE DAILY   Blood Glucose Monitoring Suppl (CONTOUR NEXT MONITOR) w/Device KIT 1 each by Does not apply route in the morning and at bedtime. Use Contour Next meter to check blood sugar twice daily.   Calcium Carb-Cholecalciferol (CALTRATE 600+D3 PO) Take 1 tablet by mouth daily. 300 mg Vitamin D3   clopidogrel (PLAVIX) 75 MG tablet Take 1 tablet (75 mg total) by mouth daily.   Co-Enzyme Q-10 100 MG CAPS Take 100 mg by mouth daily.   dextroamphetamine (DEXEDRINE SPANSULE) 15 MG 24 hr capsule Take 15 mg by mouth as needed.   diazepam (VALIUM) 10 MG tablet Take 5-10 mg by mouth 2 (two) times daily as needed for anxiety.   diphenhydrAMINE (BENADRYL) 25 MG tablet Take 25-50 mg by mouth at bedtime.   docusate sodium (COLACE) 100 MG capsule Take 100-300 mg by mouth daily as needed for mild constipation or moderate constipation.   empagliflozin (JARDIANCE) 25 MG TABS tablet TAKE 1 TABLET(25 MG) BY MOUTH DAILY   ezetimibe (ZETIA) 10 MG tablet TAKE 1 TABLET(10 MG) BY MOUTH DAILY   famotidine (PEPCID) 40 MG tablet Take 40 mg by mouth 2 (two) times daily. 30 min. Before meal and at bedtime   gabapentin (NEURONTIN) 300 MG capsule TAKE 1 CAPSULE(300 MG) BY MOUTH THREE TIMES DAILY   glucose blood (CONTOUR NEXT TEST) test strip Use as instructed   insulin detemir (LEVEMIR FLEXTOUCH) 100 UNIT/ML FlexPen Inject 60 Units into the skin every evening.   Insulin Pen Needle (B-D UF III MINI PEN NEEDLES) 31G X 5 MM MISC USE FOR INJECTIONS TWICE DAILY   ketoconazole  (NIZORAL) 2 % cream Apply to affected areas one to two times daily as needed.   lamoTRIgine (LAMICTAL) 200 MG tablet Take 400 mg by mouth daily.   latanoprost (XALATAN) 0.005 % ophthalmic solution Place 1 drop into both eyes 2 (two) times a week.    LEVOXYL 125 MCG tablet Take 2 tablets (250 mcg total) by mouth daily before breakfast.   loperamide (IMODIUM A-D) 2 MG tablet Take 2 mg by mouth daily as needed for diarrhea or loose stools.   lubiprostone (AMITIZA) 24 MCG capsule Take 1 capsule (24 mcg total) by mouth 2 (two) times daily with a meal.   MAGNESIUM CITRATE PO Take by mouth. Dose unknown   Melatonin 10 MG CAPS Take 10 mg by mouth at bedtime.   mesalamine (LIALDA) 1.2 g EC tablet TAKE 2 TABLETS BY MOUTH DAILY   metoprolol succinate (TOPROL XL) 25 MG 24 hr tablet Take 0.5 tablets (12.5 mg total) by mouth as needed (As needed at bedtime for systolic blood pressure greater than 100).   Multiple Vitamin (MULTIVITAMIN) tablet Take 1 tablet by mouth daily. Woman 50 plus   nitroGLYCERIN (NITROSTAT) 0.4 MG SL tablet Place 1 tablet (0.4 mg total) under the tongue every 5 (five) minutes as needed for chest pain.   olmesartan (BENICAR) 5 MG tablet Take 1 tablet (5 mg total) by mouth daily.   ondansetron (ZOFRAN-ODT) 4 MG disintegrating tablet DISSOLVE 1 TABLET(4 MG) ON THE TONGUE EVERY 6 HOURS AS NEEDED FOR NAUSEA OR VOMITING   pantoprazole (PROTONIX) 40 MG tablet TAKE 1 TABLET(40 MG) BY MOUTH TWICE DAILY   polyethylene glycol (MIRALAX / GLYCOLAX) 17 g packet Take 17 g by mouth 3 (three) times daily as needed for moderate constipation, mild constipation or severe constipation. In water or beverage   Probiotic Product (CULTURELLE PROBIOTICS PO) Take 1 capsule by mouth daily. For  woman   QUEtiapine (SEROQUEL XR) 400 MG 24 hr tablet Take 800 mg by mouth at bedtime.   [START ON 09/04/2020] Semaglutide, 1 MG/DOSE, (OZEMPIC, 1 MG/DOSE,) 4 MG/3ML SOPN Inject 1 mg as directed every Monday.   simethicone  (MYLICON) 80 MG chewable tablet Chew 80 mg by mouth 4 (four) times daily.   Tavaborole (KERYDIN) 5 % SOLN Apply 1 application topically at bedtime. Apply to affected area of left great toenail   valACYclovir (VALTREX) 1000 MG tablet TAKE 1 TABLET(1000 MG) BY MOUTH DAILY   VRAYLAR capsule Take 3 mg by mouth at bedtime.   zaleplon (SONATA) 10 MG capsule Take 10-20 mg by mouth at bedtime.     Allergies:   Ephedrine, Aspirin, Crestor [rosuvastatin], Erythromycin, Nsaids, and Oxycodone   Social History   Socioeconomic History   Marital status: Married    Spouse name: alan   Number of children: 2   Years of education: college   Highest education level: Not on file  Occupational History   Occupation: unemployed  Tobacco Use   Smoking status: Former    Packs/day: 0.25    Years: 5.00    Pack years: 1.25    Types: Cigarettes    Quit date: 01/14/1993    Years since quitting: 27.6   Smokeless tobacco: Never  Vaping Use   Vaping Use: Never used  Substance and Sexual Activity   Alcohol use: No    Alcohol/week: 0.0 standard drinks    Comment: Recovered ETOH   Drug use: No   Sexual activity: Not Currently    Partners: Male  Other Topics Concern   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      [employer] telephone triage   Social Determinants of Health   Financial Resource Strain: Not on file  Food Insecurity: Not on file  Transportation Needs: Not on file  Physical Activity: Not on file  Stress: Not on file  Social Connections: Not on file     Family History: The patient's family history includes Alzheimer's disease in her father; Breast cancer in an other family member; Colon cancer in her father; Colon polyps in her father; Coronary artery disease in her father, mother, and another family member; Crohn's disease in her mother; Dementia in her father; Diabetes in an other family  member; Hypertension in her mother; Osteoporosis in her mother; Stroke in her cousin. There is no history of Esophageal cancer, Rectal cancer, or Stomach cancer.  ROS:   Please see the history of present illness.    All other systems reviewed and are negative.  EKGs/Labs/Other Studies Reviewed:    The following studies were reviewed today:  June 20, 2020 EKG  Bigeminal PVCs with a steeply inferior axis.  Positive throughout the precordium.  Suspect a basal LV site of origin near the outflow tracts.   March 16, 2020 echo personally reviewed Left ventricular function normal, 50 to 55% No significant valvular abnormalities Frequent PVCs   EKG:  The ekg ordered today demonstrates sinus rhythm with frequent PVCs.  PVCs are monomorphic with a steeply inferior axis, negative in lead I and a precordial transition in lead V3.  Suggestive of an LVOT exit.  Recent Labs: 05/01/2020: Hemoglobin 15.3; Platelets 302 06/05/2020: ALT 45; BUN 23; Creatinine, Ser 0.94; Potassium 4.6; Sodium 145; TSH 3.12 06/06/2020: Magnesium  2.4  Recent Lipid Panel    Component Value Date/Time   CHOL 122 06/14/2020 1046   TRIG 106.0 06/14/2020 1046   HDL 45.80 06/14/2020 1046   CHOLHDL 3 06/14/2020 1046   VLDL 21.2 06/14/2020 1046   LDLCALC 55 06/14/2020 1046   LDLDIRECT 125.0 02/07/2020 1134    Physical Exam:    VS:  BP 120/70   Pulse 87   Ht 5' 6.25" (1.683 m)   Wt 190 lb 12.8 oz (86.5 kg)   LMP 08/23/2006 (Approximate) Comment: tubal ligation  SpO2 98%   BMI 30.56 kg/m     Wt Readings from Last 3 Encounters:  08/30/20 190 lb 12.8 oz (86.5 kg)  08/30/20 191 lb (86.6 kg)  07/12/20 183 lb 12.8 oz (83.4 kg)     GEN:  Well nourished, well developed in no acute distress.  Obese HEENT: Normal NECK: No JVD; No carotid bruits LYMPHATICS: No lymphadenopathy CARDIAC: Irregular rhythm, no murmurs, rubs, gallops RESPIRATORY:  Clear to auscultation without rales, wheezing or rhonchi  ABDOMEN: Soft,  non-tender, non-distended MUSCULOSKELETAL:  No edema; No deformity  SKIN: Warm and dry NEUROLOGIC:  Alert and oriented x 3 PSYCHIATRIC:  Normal affect   ASSESSMENT:    1. PVC's (premature ventricular contractions)   2. History of non-ST elevation myocardial infarction (NSTEMI)   3. Coronary artery disease involving native coronary artery of native heart without angina pectoris   4. Essential hypertension    PLAN:    In order of problems listed above:   1. PVC's (premature ventricular contractions) Frequent.  Monomorphic originating from the LVOT.  She is completely asymptomatic with these.  Her left ventricular function is normal.  For now, would favor a watchful waiting approach.  I will have her follow-up with [place] in about 1 year.  If she begins experiencing symptoms or change in her left ventricular function, would recommend suppression with an antiarrhythmic drug.  2. History of non-ST elevation myocardial infarction (NSTEMI) No ischemic symptoms today.  Continue aspirin and Plavix.  3. Coronary artery disease involving native coronary artery of native heart without angina pectoris See #2   4. Essential hypertension Blood pressures controlled today.  Continue current regimen.  Encouraged her to continue her exercise regimen.    Follow-up 1 year    Medication Adjustments/Labs and Tests Ordered: Current medicines are reviewed at length with the patient today.  Concerns regarding medicines are outlined above.  Orders Placed This Encounter  Procedures   EKG 12-Lead    No orders of the defined types were placed in this encounter.    Signed, Hilton Cork. Quentin Ore, MD, [facility], [facility] 08/30/2020 10:53 AM    Electrophysiology [facility]

## 2020-08-30 NOTE — Progress Notes (Signed)
Reason for visit: Fibromyalgia, memory disturbance  Lindsay Fry is an 60 y.o. female  History of present illness:  Lindsay Fry is a 60 year old right-handed white female with multiple medical issues and multiple complaints.  The patient has a history of central obesity, diabetes, fibromyalgia, ulcerative colitis, migraine headache, hypertension, anxiety and OCD issues, bipolar disease, ADD, low back pain, mild memory disturbance, and coronary artery disease.  The patient had a stent placed in March 2022, she has felt better since this.  She had a recent hemoglobin A1c of 5.9, but she indicates that she is not having any episodes of hypoglycemia.  She does report some occasional word finding issues, but otherwise she is functioning fairly well cognitively.  At times, she does not sleep well at night, she takes Sonata every night for sleep.  Usually, she will have good energy levels throughout the day.  She returns to the office today for an evaluation.  She is getting no medications through this office.  Past Medical History:  Diagnosis Date   ADD (attention deficit disorder) 11/19/2012   Allergic rhinitis, cause unspecified    Anxiety state, unspecified    Arthritis    Asymptomatic postmenopausal status (age-related) (natural)    Atypical hyperplasia of left breast 2000   Atypical mole 03/16/2018   R paraspinal mid upper back, excision   Atypical mole 02/17/2018   R spinal upper back, moderate   Atypical mole 08/11/2017   L sternum, excision   Atypical mole 05/27/2017   L post neck, moderate   Benign neoplasm of other and unspecified site of the digestive system    Bipolar disorder ([place])    Breast cancer ([place])    Left Breast 83m invasive CA left uiq, dcis left uoq and a lot ADH, ALH in both breasts.   CAD (coronary artery disease)    a. 08/2014 NSTEMI/Cath: LM nl, LAD 50p, D1/D2 min irregs, LCX nl, OM1 40, LPDA nl, RI 99ost (2.075mvessel->med Rx), RCA nl, AM 60, nl EF; b.  11/2016 MV: EF 74%, no ischemia (performed 2/2 dyspnea & new inf TWI).   Carcinoma of left breast ([facility]/19/2016   Cataract    Chronic diastolic CHF (congestive heart failure) ([facility]   a. 08/2014 Echo: EF 60-65%, Gr 1 DD, nl LA.   Clotting disorder ([facility]   on blood thiner, Plavix   Cyst    on Achilles tendon   Depression    Diabetes mellitus ([facility] [place]   frank   Dysuria    Edema    Family history of malignant neoplasm of gastrointestinal tract    Fibromyalgia    GERD (gastroesophageal reflux disease)    Glaucoma    pre glaucoma, 05/14/2018 pt. denies glaucoma   Headache    migraines   Hiatal hernia    History of alcoholism ([facility]   History of migraines    History of ovarian cyst    Hx of adenomatous colonic polyps    Insomnia, unspecified    Myocardial infarction ([facility]8-[place]-16   OCD (obsessive compulsive disorder)    Osteopenia 09/25/2016   Femoral neck T -1.1  9/18   Other screening mammogram    Pure hypercholesterolemia    Rosacea    Subacute confusional state 11/19/2012   Tubular adenoma of colon 01/10/11   Ulcerative colitis, unspecified    Unspecified asthma(493.90)    Unspecified essential hypertension    Unspecified hypothyroidism    Unspecified vitamin D deficiency  Vertigo     Past Surgical History:  Procedure Laterality Date   APPENDECTOMY     AXILLARY SENTINEL NODE BIOPSY Left 05/23/2014   Procedure: AXILLARY SENTINEL NODE BIOPSY;  Surgeon: Christene Lye, MD;  Location: [facility] ORS;  Service: General;  Laterality: Left;   BREAST BIOPSY  08/1998   on tamoxifen, atypical hyperplasia   BREAST BIOPSY Left 04/2014   BREAST SURGERY Left 08/1998   lumpectomy/ Dr Sharlet Salina   BREAST SURGERY Bilateral 05/23/2014   Mastectomy   BUNIONECTOMY Right    CARDIAC CATHETERIZATION N/A 09/05/2014   Procedure: Left Heart Cath and Coronary Angiography;  Surgeon: Wellington Hampshire, MD;  Location: [place] [facility];  Service: Cardiovascular;  Laterality: N/A;   CARDIAC  CATHETERIZATION  09/05/2014   CATARACT EXTRACTION W/ INTRAOCULAR LENS IMPLANT Bilateral    CESAREAN SECTION  1996/1997   placenta previa, gest DM, pre-eclampsia   CHOLECYSTECTOMY  1997   adhesions also   COLONOSCOPY  01/2001   Ulcerative colitis   COLONOSCOPY  04/2004   UC, polyp   COLONOSCOPY  12/2006   UC, no polyps   CORONARY STENT INTERVENTION N/A 04/06/2020   Procedure: CORONARY STENT INTERVENTION;  Surgeon: Nelva Bush, MD;  Location: [place] [facility];  Service: Cardiovascular;  Laterality: N/A;   DEXA  04/1999 and 2010   normal   ESOPHAGOGASTRODUODENOSCOPY  09/2001   polyp   exercise stress test  11/2003   negative   FEMUR FRACTURE SURGERY Left    LEFT HEART CATH AND CORONARY ANGIOGRAPHY N/A 04/06/2020   Procedure: LEFT HEART CATH AND CORONARY ANGIOGRAPHY;  Surgeon: Minna Merritts, MD;  Location: [facility] [facility];  Service: Cardiovascular;  Laterality: N/A;   MASTECTOMY Bilateral    MECKEL DIVERTICULUM EXCISION  1999   NASAL SINUS SURGERY  05/1997   nuclear stress test  06/2007   negative   precancerous mole removed     RIGHT AND LEFT HEART CATH     with 1 stent placed in March 2022   SIMPLE MASTECTOMY WITH AXILLARY SENTINEL NODE BIOPSY Bilateral 05/23/2014   Procedure: Bilateral simple mastectomy, left sentinel node biopsy ;  Surgeon: Christene Lye, MD;  Location: [facility];  Service: General;  Laterality: Bilateral;   Sleep study  11/2007   no apnea, but did snore (done by [facility])   [place] EXTRACTION  22's    Family History  Problem Relation Age of Onset   Coronary artery disease Father    Colon cancer Father    Alzheimer's disease Father    Dementia Father    Colon polyps Father    Coronary artery disease Mother    Hypertension Mother    Osteoporosis Mother    Crohn's disease Mother        crohns colitis   Breast cancer Other        great aunts   Coronary artery disease Other         Uncle (also AAA)   Diabetes Other        remote family history   Stroke Cousin    Esophageal cancer Neg Hx    Rectal cancer Neg Hx    Stomach cancer Neg Hx     Social history:  reports that she quit smoking about 27 years ago. Her smoking use included cigarettes. She has a 1.25 pack-year smoking history. She has never used smokeless tobacco. She reports that she  does not drink alcohol and does not use drugs.    Allergies  Allergen Reactions   Ephedrine Other (See Comments)    Pt becomes hyper   Aspirin     REACTION: aggrivates colitis   Crestor [Rosuvastatin]     REACTION: increased lfts   Erythromycin     REACTION: GI upset   Nsaids     REACTION: aggrivate colitis   Oxycodone Other (See Comments)    Panic Attack    Medications:  Prior to Admission medications   Medication Sig Start Date End Date Taking? Authorizing Provider  acetaminophen (TYLENOL) 650 MG CR tablet Take 1,300 mg by mouth 2 (two) times daily.   Yes [provider]  amantadine (SYMMETREL) 100 MG capsule Take 100 mg by mouth 2 (two) times daily.   Yes [provider]  Ascorbic Acid (VITAMIN C PO) Take 1,000 mg by mouth daily.   Yes [provider]  aspirin 81 MG chewable tablet Chew 1 tablet (81 mg total) by mouth daily. 04/07/20  Yes Furth, Cadence H, PA-C  atorvastatin (LIPITOR) 80 MG tablet Take one pill daily 04/11/20  Yes Gollan, Kathlene November, MD  B Complex Vitamins (VITAMIN B COMPLEX PO) Take 1 tablet by mouth every other day.   Yes [provider]  BAYER MICROLET LANCETS lancets USE AS DIRECTED TO CHECK BLOOD SUGAR TWICE DAILY 05/20/16  Yes Elayne Snare, MD  Blood Glucose Monitoring Suppl (CONTOUR NEXT MONITOR) w/Device KIT 1 each by Does not apply route in the morning and at bedtime. Use Contour Next meter to check blood sugar twice daily. 04/29/19  Yes Elayne Snare, MD  Calcium Carb-Cholecalciferol (CALTRATE 600+D3 PO) Take 1 tablet by mouth daily. 300 mg Vitamin D3   Yes  [provider]  clopidogrel (PLAVIX) 75 MG tablet Take 1 tablet (75 mg total) by mouth daily. 06/28/20  Yes Minna Merritts, MD  Co-Enzyme Q-10 100 MG CAPS Take 100 mg by mouth daily.   Yes [provider]  dextroamphetamine (DEXEDRINE SPANSULE) 15 MG 24 hr capsule Take 15 mg by mouth as needed. 08/21/20  Yes [provider]  diazepam (VALIUM) 10 MG tablet Take 5-10 mg by mouth 2 (two) times daily as needed for anxiety.   Yes [provider]  diphenhydrAMINE (BENADRYL) 25 MG tablet Take 25-50 mg by mouth at bedtime.   Yes [provider]  docusate sodium (COLACE) 100 MG capsule Take 100-300 mg by mouth daily as needed for mild constipation or moderate constipation.   Yes [provider]  empagliflozin (JARDIANCE) 25 MG TABS tablet TAKE 1 TABLET(25 MG) BY MOUTH DAILY 06/29/20  Yes Elayne Snare, MD  ezetimibe (ZETIA) 10 MG tablet TAKE 1 TABLET(10 MG) BY MOUTH DAILY 01/24/20  Yes Elayne Snare, MD  famotidine (PEPCID) 40 MG tablet Take 40 mg by mouth 2 (two) times daily. 30 min. Before meal and at bedtime   Yes [provider]  gabapentin (NEURONTIN) 300 MG capsule TAKE 1 CAPSULE(300 MG) BY MOUTH THREE TIMES DAILY 03/27/20  Yes Tower, Marne A, MD  glucose blood (CONTOUR NEXT TEST) test strip Use as instructed 11/18/19  Yes Elayne Snare, MD  insulin detemir (LEVEMIR FLEXTOUCH) 100 UNIT/ML FlexPen Inject 60 Units into the skin every evening. 06/15/20  Yes Elayne Snare, MD  Insulin Pen Needle (B-D UF III MINI PEN NEEDLES) 31G X 5 MM MISC USE FOR INJECTIONS TWICE DAILY 11/18/19  Yes Elayne Snare, MD  ketoconazole (NIZORAL) 2 % cream Apply to affected  areas one to two times daily as needed. 08/29/20  Yes Brendolyn Patty, MD  lamoTRIgine (LAMICTAL) 200 MG tablet Take 400 mg by mouth daily.   Yes [provider]  latanoprost (XALATAN) 0.005 % ophthalmic solution Place 1 drop into both eyes 2 (two) times a week.  10/12/10  Yes [provider]   LEVOXYL 125 MCG tablet Take 2 tablets (250 mcg total) by mouth daily before breakfast. 05/05/20  Yes Elayne Snare, MD  loperamide (IMODIUM A-D) 2 MG tablet Take 2 mg by mouth daily as needed for diarrhea or loose stools.   Yes [provider]  lubiprostone (AMITIZA) 24 MCG capsule Take 1 capsule (24 mcg total) by mouth 2 (two) times daily with a meal. 07/11/20  Yes Irene Shipper, MD  MAGNESIUM CITRATE PO Take by mouth. Dose unknown   Yes [provider]  Melatonin 10 MG CAPS Take 10 mg by mouth at bedtime.   Yes [provider]  mesalamine (LIALDA) 1.2 g EC tablet TAKE 2 TABLETS BY MOUTH DAILY 07/11/20  Yes Irene Shipper, MD  metoprolol succinate (TOPROL XL) 25 MG 24 hr tablet Take 0.5 tablets (12.5 mg total) by mouth as needed (As needed at bedtime for systolic blood pressure greater than 100). 06/06/20  Yes Dunn, Areta Haber, PA-C  Multiple Vitamin (MULTIVITAMIN) tablet Take 1 tablet by mouth daily. Woman 50 plus   Yes [provider]  nitroGLYCERIN (NITROSTAT) 0.4 MG SL tablet Place 1 tablet (0.4 mg total) under the tongue every 5 (five) minutes as needed for chest pain. 04/03/20  Yes Visser, Jacquelyn D, PA-C  olmesartan (BENICAR) 5 MG tablet Take 1 tablet (5 mg total) by mouth daily. 06/06/20  Yes Dunn, Ryan M, PA-C  ondansetron (ZOFRAN-ODT) 4 MG disintegrating tablet DISSOLVE 1 TABLET(4 MG) ON THE TONGUE EVERY 6 HOURS AS NEEDED FOR NAUSEA OR VOMITING 09/22/19  Yes Irene Shipper, MD  pantoprazole (PROTONIX) 40 MG tablet TAKE 1 TABLET(40 MG) BY MOUTH TWICE DAILY 05/26/20  Yes Irene Shipper, MD  polyethylene glycol (MIRALAX / GLYCOLAX) 17 g packet Take 17 g by mouth 3 (three) times daily as needed for moderate constipation, mild constipation or severe constipation. In water or beverage   Yes [provider]  Probiotic Product (CULTURELLE PROBIOTICS PO) Take 1 capsule by mouth daily. For woman   Yes [provider]  QUEtiapine (SEROQUEL XR) 400 MG 24 hr tablet  Take 800 mg by mouth at bedtime.   Yes [provider]  Semaglutide, 1 MG/DOSE, (OZEMPIC, 1 MG/DOSE,) 4 MG/3ML SOPN Inject 1 mg as directed every Monday. 09/04/20  Yes Elayne Snare, MD  simethicone (MYLICON) 80 MG chewable tablet Chew 80 mg by mouth 4 (four) times daily.   Yes [provider]  Tavaborole (KERYDIN) 5 % SOLN Apply 1 application topically at bedtime. Apply to affected area of left great toenail 08/29/20  Yes Brendolyn Patty, MD  valACYclovir (VALTREX) 1000 MG tablet TAKE 1 TABLET(1000 MG) BY MOUTH DAILY 03/27/20  Yes Tower, Wynelle Fanny, MD  VRAYLAR capsule Take 3 mg by mouth at bedtime. 02/28/20  Yes [provider]  zaleplon (SONATA) 10 MG capsule Take 10-20 mg by mouth at bedtime.   Yes [provider]    ROS:  Out of a complete 14 system review of symptoms, the patient complains only of the following symptoms, and all other reviewed systems are negative.  Low back pain Insomnia Memory problems  Blood pressure 119/67, pulse (!) 50,  height 5' 6.5" (1.689 m), weight 191 lb (86.6 kg), last menstrual period 08/23/2006.  Physical Exam  General: The patient is alert and cooperative at the time of the examination.  The patient is moderately obese, central obesity noted.  Skin: No significant peripheral edema is noted.   Neurologic Exam  Mental status: The patient is alert and oriented x 3 at the time of the examination. The patient has apparent normal recent and remote memory, with an apparently normal attention span and concentration ability.  Mini-Mental status examination done today shows a total score of 30/30.  The patient is able to name 23 four-legged animals in 60 seconds.   Cranial nerves: Facial symmetry is present. Speech is normal, no aphasia or dysarthria is noted. Extraocular movements are full. Visual fields are full.  Motor: The patient has good strength in all 4 extremities.  Sensory examination: Soft touch sensation is symmetric  on the face, arms, and legs.  Coordination: The patient has good finger-nose-finger and heel-to-shin bilaterally.  Gait and station: The patient has a normal gait. Tandem gait is normal. Romberg is negative. No drift is seen.  Reflexes: Deep tendon reflexes are symmetric.   Assessment/Plan:  1.  Mild cognitive impairment  2.  Fibromyalgia  3.  Migraine headache  The patient indicates that her migraine headache issues have been well controlled, she sees Dr. Domingo Cocking for this.  She is on no medications through this office.  We will follow her conservatively for her reported mild memory issues which have not changed over time.  She does have a family history of Alzheimer disease in her father and with his mother side of the family.  She will follow-up here in 9 months, she may be seen by Dr. Brett Fairy in the future.  Jill Alexanders MD 08/30/2020 8:11 AM  [facility] [address] [place], [place] [address]  Phone [phone] Fax [phone]

## 2020-08-30 NOTE — Patient Instructions (Addendum)
Medication Instructions:  Your physician recommends that you continue on your current medications as directed. Please refer to the Current Medication list given to you today. *If you need a refill on your cardiac medications before your next appointment, please call your pharmacy*  Lab Work: None ordered. If you have labs (blood work) drawn today and your tests are completely normal, you will receive your results only by: [place] (if you have MyChart) OR A paper copy in the mail If you have any lab test that is abnormal or we need to change your treatment, we will call you to review the results.  Testing/Procedures: None ordered.  Follow-Up: At [facility], you and your health needs are our priority.  As part of our continuing mission to provide you with exceptional heart care, we have created designated Provider Care Teams.  These Care Teams include your primary Cardiologist (physician) and Advanced Practice Providers (APPs -  Physician Assistants and Nurse Practitioners) who all work together to provide you with the care you need, when you need it.  Your next appointment:   Your physician wants you to follow-up in: one year  You will see one of the following Advanced Practice Providers on your designated Care Team:   Murray Hodgkins, NP Christell Faith, PA-C Marrianne Mood, PA-C Cadence Kathlen Mody, PA-C You will receive a reminder letter in the mail two months in advance. If you don't receive a letter, please call our office to schedule the follow-up appointment.

## 2020-09-05 ENCOUNTER — Encounter: Payer: Self-pay | Admitting: Dermatology

## 2020-09-07 ENCOUNTER — Telehealth: Payer: Self-pay | Admitting: Endocrinology

## 2020-09-07 DIAGNOSIS — E1169 Type 2 diabetes mellitus with other specified complication: Secondary | ICD-10-CM

## 2020-09-07 MED ORDER — LEVOXYL 125 MCG PO TABS: 250.0000 ug | ORAL_TABLET | Freq: Every day | ORAL | 0 refills | Status: DC

## 2020-09-07 NOTE — Telephone Encounter (Signed)
Pt called and stated she needs a refill of LEVOXYL 125 MCG tablet sent into her pharmacy [facility] in [place]

## 2020-09-07 NOTE — Telephone Encounter (Signed)
Rx sent to pharmacy   

## 2020-09-10 NOTE — Telephone Encounter (Signed)
Rx sent in on 8/25. Patient notified on a different encounter.

## 2020-09-12 DIAGNOSIS — E1169 Type 2 diabetes mellitus with other specified complication: Secondary | ICD-10-CM

## 2020-09-12 MED ORDER — LEVOXYL 125 MCG PO TABS: 250.0000 ug | ORAL_TABLET | Freq: Every day | ORAL | 3 refills | Status: DC

## 2020-09-27 ENCOUNTER — Ambulatory Visit: Payer: BC Managed Care – PPO | Admitting: Physician Assistant

## 2020-09-28 ENCOUNTER — Other Ambulatory Visit: Payer: Self-pay | Admitting: Endocrinology

## 2020-09-28 DIAGNOSIS — E1169 Type 2 diabetes mellitus with other specified complication: Secondary | ICD-10-CM

## 2020-09-28 DIAGNOSIS — E669 Obesity, unspecified: Secondary | ICD-10-CM

## 2020-10-01 ENCOUNTER — Other Ambulatory Visit: Payer: Self-pay | Admitting: Physician Assistant

## 2020-10-01 ENCOUNTER — Other Ambulatory Visit: Payer: Self-pay | Admitting: Family Medicine

## 2020-10-02 ENCOUNTER — Other Ambulatory Visit: Payer: Self-pay | Admitting: *Deleted

## 2020-10-02 MED ORDER — METOPROLOL SUCCINATE ER 25 MG PO TB24: ORAL_TABLET | ORAL | 0 refills | Status: DC

## 2020-10-02 NOTE — Telephone Encounter (Signed)
Received fax from pharmacy requesting 90 day supply of Metoprolol. Medication was sent to pharmacy on file with #45 and 0 refills.

## 2020-10-02 NOTE — Telephone Encounter (Signed)
Last filled on 03/27/20 #30 tabs with 3 refills, last OV was an acute appt with Dr. Lorelei Pont on 04/12/20 (CPE was on 03/27/20). Please advise

## 2020-10-03 NOTE — Telephone Encounter (Signed)
Rx refilled on 09/29/20

## 2020-10-04 DIAGNOSIS — Z20822 Contact with and (suspected) exposure to covid-19: Secondary | ICD-10-CM | POA: Diagnosis not present

## 2020-10-04 DIAGNOSIS — Z03818 Encounter for observation for suspected exposure to other biological agents ruled out: Secondary | ICD-10-CM | POA: Diagnosis not present

## 2020-10-06 DIAGNOSIS — Z03818 Encounter for observation for suspected exposure to other biological agents ruled out: Secondary | ICD-10-CM | POA: Diagnosis not present

## 2020-10-06 DIAGNOSIS — Z20822 Contact with and (suspected) exposure to covid-19: Secondary | ICD-10-CM | POA: Diagnosis not present

## 2020-10-08 NOTE — Progress Notes (Signed)
Date:  10/10/2020   ID:  Lindsay Fry, DOB 05/15/1960, MRN [id number]  Patient Location:  [place] [id number]   Provider location:   [facility], [place] office  PCP:  Abner Greenspan, MD  Cardiologist:  Arvid [facility]   Chief Complaint  Patient presents with   1 month follow up     "Doing well." Medications reviewed by the patient verbally.     History of Present Illness:    Lindsay Fry is a 60 y.o. female  past medical history of Morbid obesity,  CAD Severe ostial ramus disease treated medically by catheterization August 2016, Cardiac catheterization April 06, 2020,  underwent stenting of OM branch hypertension,  hyperlipidemia,   previous symptoms of chest pain and shortness of breath.  Migraines/bipolar I Chronic tachycardia She presents today for follow-up of her coronary artery disease, hypertension, hyperlipidemia, recent cardiac catheterization with stent placement  In follow-up today reports that she is doing well Denies any anginal symptoms Reviewed prior procedure March 2022 Has felt better since that time  Cardiac catheterization April 06, 2020 Severe OM disease, likely culprit vessel Chronic occlusion of small ramus branch Moderate to severe disease of proximal diagonal #2  Mild to moderate proximal LAD disease   underwent stenting of OM branch  Sleep disorder, attributes to bipolar Feels her bipolar is well controlled  Labs reviewed A1C 5.9 Toral chol 122  EKG personally reviewed by myself on todays visit shows  NSR rate 91 bpm left axis deviation T wave abnormality 1 and aVL   Past Medical History:  Diagnosis Date   ADD (attention deficit disorder) 11/19/2012   Allergic rhinitis, cause unspecified    Anxiety state, unspecified    Arthritis    Asymptomatic postmenopausal status (age-related) (natural)    Atypical hyperplasia of left breast 2000   Atypical mole 03/16/2018   R paraspinal  mid upper back, excision   Atypical mole 02/17/2018   R spinal upper back, moderate   Atypical mole 08/11/2017   L sternum, excision   Atypical mole 05/27/2017   L post neck, moderate   Benign neoplasm of other and unspecified site of the digestive system    Bipolar disorder ([place])    Breast cancer ([place])    Left Breast 62m invasive CA left uiq, dcis left uoq and a lot ADH, ALH in both breasts.   CAD (coronary artery disease)    a. 08/2014 NSTEMI/Cath: LM nl, LAD 50p, D1/D2 min irregs, LCX nl, OM1 40, LPDA nl, RI 99ost (2.021mvessel->med Rx), RCA nl, AM 60, nl EF; b. 11/2016 MV: EF 74%, no ischemia (performed 2/2 dyspnea & new inf TWI).   Carcinoma of left breast (HCRewey5/19/2016   Cataract    Chronic diastolic CHF (congestive heart failure) (HCLagro   a. 08/2014 Echo: EF 60-65%, Gr 1 DD, nl LA.   Clotting disorder (HCTemperance   on blood thiner, Plavix   Cyst    on Achilles tendon   Depression    Diabetes mellitus (HCColumbus   frank   Dysuria    Edema    Family history of malignant neoplasm of gastrointestinal tract    Fibromyalgia    GERD (gastroesophageal reflux disease)    Glaucoma    pre glaucoma, 05/14/2018 pt. denies glaucoma   Headache    migraines   Hiatal hernia    History of alcoholism (HCFranklin   History of migraines  History of ovarian cyst    Hx of adenomatous colonic polyps    Insomnia, unspecified    Myocardial infarction ([place]) 09-04-14   OCD (obsessive compulsive disorder)    Osteopenia 09/25/2016   Femoral neck T -1.1  9/18   Other screening mammogram    Pure hypercholesterolemia    Rosacea    Subacute confusional state 11/19/2012   Tubular adenoma of colon 01/10/11   Ulcerative colitis, unspecified    Unspecified asthma(493.90)    Unspecified essential hypertension    Unspecified hypothyroidism    Unspecified vitamin D deficiency    Vertigo    Past Surgical History:  Procedure Laterality Date   APPENDECTOMY     AXILLARY SENTINEL NODE BIOPSY Left 05/23/2014    Procedure: AXILLARY SENTINEL NODE BIOPSY;  Surgeon: Christene Lye, MD;  Location: [facility] ORS;  Service: General;  Laterality: Left;   BREAST BIOPSY  08/1998   on tamoxifen, atypical hyperplasia   BREAST BIOPSY Left 04/2014   BREAST SURGERY Left 08/1998   lumpectomy/ Dr Sharlet Salina   BREAST SURGERY Bilateral 05/23/2014   Mastectomy   BUNIONECTOMY Right    CARDIAC CATHETERIZATION N/A 09/05/2014   Procedure: Left Heart Cath and Coronary Angiography;  Surgeon: Wellington Hampshire, MD;  Location: [place] [facility];  Service: Cardiovascular;  Laterality: N/A;   CARDIAC CATHETERIZATION  09/05/2014   CATARACT EXTRACTION W/ INTRAOCULAR LENS IMPLANT Bilateral    CESAREAN SECTION  1996/1997   placenta previa, gest DM, pre-eclampsia   CHOLECYSTECTOMY  1997   adhesions also   COLONOSCOPY  01/2001   Ulcerative colitis   COLONOSCOPY  04/2004   UC, polyp   COLONOSCOPY  12/2006   UC, no polyps   CORONARY STENT INTERVENTION N/A 04/06/2020   Procedure: CORONARY STENT INTERVENTION;  Surgeon: Nelva Bush, MD;  Location: [place] [facility];  Service: Cardiovascular;  Laterality: N/A;   DEXA  04/1999 and 2010   normal   ESOPHAGOGASTRODUODENOSCOPY  09/2001   polyp   exercise stress test  11/2003   negative   FEMUR FRACTURE SURGERY Left    LEFT HEART CATH AND CORONARY ANGIOGRAPHY N/A 04/06/2020   Procedure: LEFT HEART CATH AND CORONARY ANGIOGRAPHY;  Surgeon: Minna Merritts, MD;  Location: [place] [facility];  Service: Cardiovascular;  Laterality: N/A;   MASTECTOMY Bilateral    MECKEL DIVERTICULUM EXCISION  1999   NASAL SINUS SURGERY  05/1997   nuclear stress test  06/2007   negative   precancerous mole removed     RIGHT AND LEFT HEART CATH     with 1 stent placed in March 2022   SIMPLE MASTECTOMY WITH AXILLARY SENTINEL NODE BIOPSY Bilateral 05/23/2014   Procedure: Bilateral simple mastectomy, left sentinel node biopsy ;  Surgeon: Christene Lye, MD;  Location: [facility];   Service: General;  Laterality: Bilateral;   Sleep study  11/2007   no apnea, but did snore (done by [facility])   Rafael Capo EXTRACTION  1980's     Allergies:   Ephedrine, Aspirin, Crestor [rosuvastatin], Erythromycin, Nsaids, and Oxycodone   Social History   Tobacco Use   Smoking status: Former    Packs/day: 0.25    Years: 5.00    Pack years: 1.25    Types: Cigarettes    Quit date: 01/14/1993    Years since quitting: 27.7   Smokeless tobacco: Never  Vaping Use   Vaping Use: Never used  Substance  Use Topics   Alcohol use: No    Alcohol/week: 0.0 standard drinks    Comment: Recovered ETOH   Drug use: No     Current Outpatient Medications on File Prior to Visit  Medication Sig Dispense Refill   acetaminophen (TYLENOL) 650 MG CR tablet Take 1,300 mg by mouth 2 (two) times daily.     amantadine (SYMMETREL) 100 MG capsule Take 100 mg by mouth 2 (two) times daily.     Ascorbic Acid (VITAMIN C PO) Take 1,000 mg by mouth daily.     aspirin 81 MG chewable tablet Chew 1 tablet (81 mg total) by mouth daily. 90 tablet 3   atorvastatin (LIPITOR) 80 MG tablet Take one pill daily 90 tablet 3   B Complex Vitamins (VITAMIN B COMPLEX PO) Take 1 tablet by mouth every other day.     BAYER MICROLET LANCETS lancets USE AS DIRECTED TO CHECK BLOOD SUGAR TWICE DAILY 200 each 2   Blood Glucose Monitoring Suppl (CONTOUR NEXT MONITOR) w/Device KIT 1 each by Does not apply route in the morning and at bedtime. Use Contour Next meter to check blood sugar twice daily. 1 kit 0   Calcium Carb-Cholecalciferol (CALTRATE 600+D3 PO) Take 1 tablet by mouth daily. 300 mg Vitamin D3     clopidogrel (PLAVIX) 75 MG tablet Take 1 tablet (75 mg total) by mouth daily. 90 tablet 3   Co-Enzyme Q-10 100 MG CAPS Take 100 mg by mouth daily.     dextroamphetamine (DEXEDRINE SPANSULE) 15 MG 24 hr capsule Take 15 mg by mouth as needed.     diazepam (VALIUM) 10 MG tablet Take 5-10 mg by  mouth 2 (two) times daily as needed for anxiety.     diphenhydrAMINE (BENADRYL) 25 MG tablet Take 25-50 mg by mouth at bedtime.     docusate sodium (COLACE) 100 MG capsule Take 100-300 mg by mouth daily as needed for mild constipation or moderate constipation.     ezetimibe (ZETIA) 10 MG tablet TAKE 1 TABLET(10 MG) BY MOUTH DAILY 90 tablet 3   famotidine (PEPCID) 40 MG tablet Take 40 mg by mouth 2 (two) times daily. 30 min. Before meal and at bedtime     gabapentin (NEURONTIN) 300 MG capsule TAKE 1 CAPSULE(300 MG) BY MOUTH THREE TIMES DAILY 90 capsule 11   glucose blood (CONTOUR NEXT TEST) test strip Use as instructed 100 each 12   insulin detemir (LEVEMIR FLEXTOUCH) 100 UNIT/ML FlexPen Inject 60 Units into the skin every evening. 15 mL 5   Insulin Pen Needle (B-D UF III MINI PEN NEEDLES) 31G X 5 MM MISC USE FOR INJECTIONS TWICE DAILY 100 each 12   JARDIANCE 25 MG TABS tablet TAKE 1 TABLET(25 MG) BY MOUTH DAILY 30 tablet 3   ketoconazole (NIZORAL) 2 % cream Apply to affected areas one to two times daily as needed. 30 g 2   lamoTRIgine (LAMICTAL) 200 MG tablet Take 400 mg by mouth daily.     latanoprost (XALATAN) 0.005 % ophthalmic solution Place 1 drop into both eyes 2 (two) times a week.      LEVOXYL 125 MCG tablet Take 2 tablets (250 mcg total) by mouth daily before breakfast. 60 tablet 3   loperamide (IMODIUM A-D) 2 MG tablet Take 2 mg by mouth daily as needed for diarrhea or loose stools.     lubiprostone (AMITIZA) 24 MCG capsule Take 1 capsule (24 mcg total) by mouth 2 (two) times daily with a meal. 60 capsule 3  MAGNESIUM CITRATE PO Take by mouth. Dose unknown     Melatonin 10 MG CAPS Take 10 mg by mouth at bedtime.     mesalamine (LIALDA) 1.2 g EC tablet TAKE 2 TABLETS BY MOUTH DAILY 60 tablet 2   metoprolol succinate (TOPROL-XL) 25 MG 24 hr tablet TAKE ONE-HALF TABLET BY MOUTH AS NEEDED AT BEDTIME FOR SYSTOLIC BLOOD PRESSURE GREATER THAN 100 45 tablet 0   Multiple Vitamin (MULTIVITAMIN)  tablet Take 1 tablet by mouth daily. Woman 50 plus     nitroGLYCERIN (NITROSTAT) 0.4 MG SL tablet Place 1 tablet (0.4 mg total) under the tongue every 5 (five) minutes as needed for chest pain. 25 tablet 4   olmesartan (BENICAR) 5 MG tablet Take 1 tablet (5 mg total) by mouth daily. 90 tablet 3   ondansetron (ZOFRAN-ODT) 4 MG disintegrating tablet DISSOLVE 1 TABLET(4 MG) ON THE TONGUE EVERY 6 HOURS AS NEEDED FOR NAUSEA OR VOMITING 90 tablet 1   pantoprazole (PROTONIX) 40 MG tablet TAKE 1 TABLET(40 MG) BY MOUTH TWICE DAILY 60 tablet 6   polyethylene glycol (MIRALAX / GLYCOLAX) 17 g packet Take 17 g by mouth 3 (three) times daily as needed for moderate constipation, mild constipation or severe constipation. In water or beverage     Probiotic Product (CULTURELLE PROBIOTICS PO) Take 1 capsule by mouth daily. For woman     QUEtiapine (SEROQUEL XR) 400 MG 24 hr tablet Take 800 mg by mouth at bedtime.     Semaglutide, 1 MG/DOSE, (OZEMPIC, 1 MG/DOSE,) 4 MG/3ML SOPN Inject 1 mg as directed every Monday. 3 mL 5   simethicone (MYLICON) 80 MG chewable tablet Chew 80 mg by mouth 4 (four) times daily.     Tavaborole (KERYDIN) 5 % SOLN Apply 1 application topically at bedtime. Apply to affected area of left great toenail 10 mL 6   valACYclovir (VALTREX) 1000 MG tablet TAKE 1 TABLET(1000 MG) BY MOUTH DAILY 30 tablet 5   VRAYLAR capsule Take 3 mg by mouth at bedtime.     zaleplon (SONATA) 10 MG capsule Take 10-20 mg by mouth at bedtime.     No current facility-administered medications on file prior to visit.     Family Hx: The patient's family history includes Alzheimer's disease in her father; Breast cancer in an other family member; Colon cancer in her father; Colon polyps in her father; Coronary artery disease in her father, mother, and another family member; Crohn's disease in her mother; Dementia in her father; Diabetes in an other family member; Hypertension in her mother; Osteoporosis in her mother; Stroke  in her cousin. There is no history of Esophageal cancer, Rectal cancer, or Stomach cancer.  ROS:   Please see the history of present illness.    Review of Systems  Constitutional: Negative.   HENT: Negative.    Respiratory: Negative.    Cardiovascular: Negative.   Gastrointestinal: Negative.   Musculoskeletal: Negative.   Neurological: Negative.   Psychiatric/Behavioral: Negative.    All other systems reviewed and are negative.   Labs/Other Tests and Data Reviewed:    Recent Labs: 05/01/2020: Hemoglobin 15.3; Platelets 302 06/05/2020: ALT 45; BUN 23; Creatinine, Ser 0.94; Potassium 4.6; Sodium 145; TSH 3.12 06/06/2020: Magnesium 2.4   Recent Lipid Panel Lab Results  Component Value Date/Time   CHOL 122 06/14/2020 10:46 AM   TRIG 106.0 06/14/2020 10:46 AM   HDL 45.80 06/14/2020 10:46 AM   CHOLHDL 3 06/14/2020 10:46 AM   LDLCALC 55 06/14/2020 10:46 AM  LDLDIRECT 125.0 02/07/2020 11:34 AM    Wt Readings from Last 3 Encounters:  10/10/20 195 lb 4 oz (88.6 kg)  08/30/20 190 lb 12.8 oz (86.5 kg)  08/30/20 191 lb (86.6 kg)     Exam:    BP 130/80 (BP Location: Right Arm, Patient Position: Sitting, Cuff Size: Normal)   Pulse 91   Ht 5' 6.5" (1.689 m)   Wt 195 lb 4 oz (88.6 kg)   LMP 08/23/2006 (Approximate) Comment: tubal ligation  SpO2 97%   BMI 31.04 kg/m  Constitutional:  oriented to person, place, and time. No distress.  HENT:  Head: Grossly normal Eyes:  no discharge. No scleral icterus.  Neck: No JVD, no carotid bruits  Cardiovascular: Regular rate and rhythm, no murmurs appreciated Pulmonary/Chest: Clear to auscultation bilaterally, no wheezes or rails Abdominal: Soft.  no distension.  no tenderness.  Musculoskeletal: Normal range of motion Neurological:  normal muscle tone. Coordination normal. No atrophy Skin: Skin warm and dry Psychiatric: normal affect, pleasant   ASSESSMENT & PLAN:    Problem List Items Addressed This Visit       Cardiology  Problems   Essential hypertension   Relevant Orders   EKG 12-Lead     Other   DM (diabetes mellitus) ([place]) (Chronic)   Other Visit Diagnoses     Coronary artery disease involving native coronary artery of native heart without angina pectoris    -  Primary   Relevant Orders   EKG 12-Lead   History of tobacco use       Hyperlipidemia LDL goal <70         CAD with stable angina Recent stent placement ostial OM vessel On asa and plavix Cholesterol at goal May need to stop Plavix in 2023 for colonoscopy  Hyperlipidemia Cholesterol is at goal on the current lipid regimen. No changes to the medications were made.  Bipolar Followed by psychiatrist Stable  Obesity We have encouraged continued exercise, careful diet management in an effort to lose weight.  Diabetes with complications B2E well controlled, 5.9    Total encounter time more than 25 minutes  Greater than 50% was spent in counseling and coordination of care with the patient    Signed, Ida Rogue, [facility] [id number], [place], [place] [address]

## 2020-10-10 ENCOUNTER — Encounter: Payer: Self-pay | Admitting: Cardiovascular Disease

## 2020-10-10 ENCOUNTER — Ambulatory Visit: Payer: BC Managed Care – PPO | Admitting: Cardiovascular Disease

## 2020-10-10 VITALS — BP 130/80 | HR 91 | Ht 66.5 in | Wt 195.2 lb

## 2020-10-10 DIAGNOSIS — I2510 Atherosclerotic heart disease of native coronary artery without angina pectoris: Secondary | ICD-10-CM | POA: Diagnosis not present

## 2020-10-10 DIAGNOSIS — I10 Essential (primary) hypertension: Secondary | ICD-10-CM

## 2020-10-10 DIAGNOSIS — E1159 Type 2 diabetes mellitus with other circulatory complications: Secondary | ICD-10-CM | POA: Diagnosis not present

## 2020-10-10 DIAGNOSIS — E785 Hyperlipidemia, unspecified: Secondary | ICD-10-CM

## 2020-10-10 DIAGNOSIS — Z87891 Personal history of nicotine dependence: Secondary | ICD-10-CM | POA: Diagnosis not present

## 2020-10-10 MED ORDER — METOPROLOL SUCCINATE ER 25 MG PO TB24: ORAL_TABLET | ORAL | 3 refills | Status: DC

## 2020-10-10 NOTE — Patient Instructions (Addendum)
Medication Instructions:  No changes  If you need a refill on your cardiac medications before your next appointment, please call your pharmacy.   Lab work: No new labs needed  Testing/Procedures: No new testing needed  Follow-Up: At [facility], you and your health needs are our priority.  As part of our continuing mission to provide you with exceptional heart care, we have created designated Provider Care Teams.  These Care Teams include your primary Cardiologist (physician) and Advanced Practice Providers (APPs -  Physician Assistants and Nurse Practitioners) who all work together to provide you with the care you need, when you need it.  You will need a follow up appointment in 12 months  Providers on your designated Care Team:   Murray Hodgkins, NP Christell Faith, PA-C Marrianne Mood, PA-C [place], [place]  COVID-19 Vaccine Information can be found at: ShippingScam.co.uk For questions related to vaccine distribution or appointments, please email [email or web address] or call [phone].

## 2020-10-12 ENCOUNTER — Other Ambulatory Visit (INDEPENDENT_AMBULATORY_CARE_PROVIDER_SITE_OTHER): Payer: BC Managed Care – PPO

## 2020-10-12 ENCOUNTER — Other Ambulatory Visit: Payer: Self-pay

## 2020-10-12 DIAGNOSIS — E890 Postprocedural hypothyroidism: Secondary | ICD-10-CM

## 2020-10-12 DIAGNOSIS — E669 Obesity, unspecified: Secondary | ICD-10-CM | POA: Diagnosis not present

## 2020-10-12 DIAGNOSIS — E1169 Type 2 diabetes mellitus with other specified complication: Secondary | ICD-10-CM | POA: Diagnosis not present

## 2020-10-12 LAB — COMPREHENSIVE METABOLIC PANEL
ALT: 37 U/L — ABNORMAL HIGH (ref 0–35)
AST: 30 U/L (ref 0–37)
Albumin: 4.5 g/dL (ref 3.5–5.2)
Alkaline Phosphatase: 89 U/L (ref 39–117)
BUN: 17 mg/dL (ref 6–23)
CO2: 29 mEq/L (ref 19–32)
Calcium: 9.3 mg/dL (ref 8.4–10.5)
Chloride: 101 mEq/L (ref 96–112)
Creatinine, Ser: 0.84 mg/dL (ref 0.40–1.20)
GFR: 74.58 mL/min (ref >60.00)
Glucose, Bld: 78 mg/dL (ref 70–99)
Potassium: 4.2 mEq/L (ref 3.5–5.1)
Sodium: 139 mEq/L (ref 135–145)
Total Bilirubin: 0.6 mg/dL (ref 0.2–1.2)
Total Protein: 6.3 g/dL (ref 6.0–8.3)

## 2020-10-12 LAB — TSH: TSH: 3.53 u[IU]/mL (ref 0.35–5.50)

## 2020-10-12 LAB — MICROALBUMIN / CREATININE URINE RATIO
Creatinine,U: 26.9 mg/dL
Microalb Creat Ratio: 2.6 mg/g (ref 0.0–30.0)
Microalb, Ur: <0.7 mg/dL (ref 0.0–1.9)

## 2020-10-12 LAB — T4, FREE: Free T4: 1.04 ng/dL (ref 0.60–1.60)

## 2020-10-12 LAB — HEMOGLOBIN A1C: Hgb A1c MFr Bld: 6 % (ref 4.6–6.5)

## 2020-10-16 ENCOUNTER — Encounter: Payer: Self-pay | Admitting: Family Medicine

## 2020-10-16 ENCOUNTER — Other Ambulatory Visit: Payer: Self-pay

## 2020-10-16 ENCOUNTER — Telehealth (INDEPENDENT_AMBULATORY_CARE_PROVIDER_SITE_OTHER): Payer: BC Managed Care – PPO | Admitting: Family Medicine

## 2020-10-16 DIAGNOSIS — Z03818 Encounter for observation for suspected exposure to other biological agents ruled out: Secondary | ICD-10-CM | POA: Diagnosis not present

## 2020-10-16 DIAGNOSIS — J0101 Acute recurrent maxillary sinusitis: Secondary | ICD-10-CM | POA: Diagnosis not present

## 2020-10-16 DIAGNOSIS — Z20822 Contact with and (suspected) exposure to covid-19: Secondary | ICD-10-CM | POA: Diagnosis not present

## 2020-10-16 MED ORDER — DOXYCYCLINE HYCLATE 100 MG PO TABS: 100.0000 mg | ORAL_TABLET | Freq: Two times a day (BID) | ORAL | 0 refills | Status: DC

## 2020-10-16 NOTE — Assessment & Plan Note (Signed)
With uri symptoms on top of allergic rhinitis Acute on chronic, facial pain and green nasal drainage Neg rapid covid test and pcr pending (will continue to isolate until this comes back) Discussed symptom control  Will stop afrin after 2-3 d Adv nasal saline  Watch for fever or wheezing Doxycycline sent to pharmacy to use as directed Update if not starting to improve in a week or if worsening  ER precautions discussed

## 2020-10-16 NOTE — Patient Instructions (Addendum)
Get your covid test back and let us know if positive  Drink fluids and rest  mucinex DM is good for cough and congestion  Nasal saline for congestion as needed  Tylenol for fever or pain or headache  Please alert [place] if symptoms worsen (if severe or short of breath please go to the ER)    If wheezing/tight chest please let us know  Take doxycycline as directed for a sinus infection

## 2020-10-16 NOTE — Progress Notes (Signed)
Virtual Visit via Video Note  I connected with Lindsay Fry on 10/16/20 at 12:00 PM EDT by a video enabled telemedicine application and verified that I am speaking with the correct person using two identifiers.  Location: Patient: home Provider: office   I discussed the limitations of evaluation and management by telemedicine and the availability of in person appointments. The patient expressed understanding and agreed to proceed.  Parties involved in encounter  Patient: Lindsay Fry  Provider:  [name] MD   History of Present Illness: Pt presents for uri symptoms  Thursday it started with scratchy throat and sinus drainage Has fall allergies   By Friday am felt really tired and bad Very tired now  Headache was bad, that is better  Was a sinus headache   Cough-worse today and some phlegm with color/thick -deep green Chest congestion  Some tightness/ feels close to wheezing now  Will let us know if she needs prednisone   Sinus pain ? Sinus infection  Some hoarseness    Nasal congestion- started clear Friday-bloody yellow mucous   Otc Advil Tylenol  Mucinex DM Afrin - 12 hour    She went to [facility] and rapid covid neg  Pcr will come back today   Her friend was covid positive recently-but saw her after symptoms started   covid immunized  Patient Active Problem List   Diagnosis Date Noted   Unstable angina ([place]) 04/06/2020   Cerebellar ataxia in diseases classified elsewhere ([place]) 03/28/2020   Elevated transaminase level 03/27/2020   Current use of proton pump inhibitor 03/19/2020   Hypokalemia 03/07/2020   History of COVID-19 03/07/2020   Aortic atherosclerosis ([place]) 03/07/2020   Double vision 12/14/2019   MCI (mild cognitive impairment) 08/05/2019   Obesity (BMI 30-39.9) 04/23/2019   Dizziness 12/16/2018   Poor balance 12/16/2018   Fatigue 12/16/2018   Migraine 11/12/2018   Uncontrolled type 2 diabetes mellitus with hyperglycemia,  with long-term current use of insulin ([place]) 02/19/2018   Pleural thickening 11/05/2017   Mouth ulcers 11/05/2017   Cervical radiculopathy due to degenerative joint disease of spine 10/28/2017   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   Supraventricular tachycardia ([place]) 04/22/2017   Osteopenia 09/25/2016   Estrogen deficiency 05/30/2016   Need for hepatitis C screening test 03/19/2016   Carcinoma of overlapping sites of left breast in female, estrogen receptor positive ([place]) 08/21/2015   Chronic constipation 01/18/2015   NSTEMI (non-ST elevated myocardial infarction) ([place]) 09/06/2014   Coronary artery disease involving native coronary artery of native heart with angina pectoris ([place]) 09/06/2014   DM (diabetes mellitus) ([place]) 09/05/2014   History of breast cancer 06/02/2014   Sinus tachycardia 04/27/2014   Acute sinusitis 01/18/2013   ADD (attention deficit disorder) 11/19/2012   Shortness of breath 09/02/2012   Low back pain 08/05/2012   Hypothyroid 05/18/2012   Chronic sinusitis 01/24/2012   Vertigo, benign positional 01/24/2012   Other screening mammogram 12/05/2010   Routine general medical examination at a health care facility 10/14/2010   POSTMENOPAUSAL STATUS 06/22/2008   Vitamin D deficiency 03/18/2008   BENIGN NEOPLASM [place] SITE DIGESTIVE SYSTEM 01/09/2007   HIATAL HERNIA 01/09/2007   COLONIC POLYPS, ADENOMATOUS, HX OF 01/09/2007   Hyperlipidemia associated with type 2 diabetes mellitus ([place]) 01/07/2007   Bipolar I disorder, most recent episode depressed ([place]) 01/07/2007   Essential hypertension 01/07/2007   ALLERGIC RHINITIS 01/07/2007   ASTHMA 01/07/2007   GERD 01/07/2007   Ulcerative colitis ([place]) 01/07/2007   ACNE ROSACEA 01/07/2007  MIGRAINES, HX OF 01/07/2007   Fibromyalgia 10/30/2006   INSOMNIA 10/30/2006   Past Medical History:  Diagnosis Date   ADD (attention deficit disorder) 11/19/2012   Allergic rhinitis, cause unspecified    Anxiety state, unspecified     Arthritis    Asymptomatic postmenopausal status (age-related) (natural)    Atypical hyperplasia of left breast 2000   Atypical mole 03/16/2018   R paraspinal mid upper back, excision   Atypical mole 02/17/2018   R spinal upper back, moderate   Atypical mole 08/11/2017   L sternum, excision   Atypical mole 05/27/2017   L post neck, moderate   Benign neoplasm of other and unspecified site of the digestive system    Bipolar disorder ([place])    Breast cancer ([place])    Left Breast 41m invasive CA left uiq, dcis left uoq and a lot ADH, ALH in both breasts.   CAD (coronary artery disease)    a. 08/2014 NSTEMI/Cath: LM nl, LAD 50p, D1/D2 min irregs, LCX nl, OM1 40, LPDA nl, RI 99ost (2.076mvessel->med Rx), RCA nl, AM 60, nl EF; b. 11/2016 MV: EF 74%, no ischemia (performed 2/2 dyspnea & new inf TWI).   Carcinoma of left breast ([facility]5/19/2016   Cataract    Chronic diastolic CHF (congestive heart failure) ([place]   a. 08/2014 Echo: EF 60-65%, Gr 1 DD, nl LA.   Clotting disorder ([facility]   on blood thiner, Plavix   Cyst    on Achilles tendon   Depression    Diabetes mellitus (HCD[place]   frank   Dysuria    Edema    Family history of malignant neoplasm of gastrointestinal tract    Fibromyalgia    GERD (gastroesophageal reflux disease)    Glaucoma    pre glaucoma, 05/14/2018 pt. denies glaucoma   Headache    migraines   Hiatal hernia    History of alcoholism ([place]   History of migraines    History of ovarian cyst    Hx of adenomatous colonic polyps    Insomnia, unspecified    Myocardial infarction ([facility]CP[facility]8-21-16   OCD (obsessive compulsive disorder)    Osteopenia 09/25/2016   Femoral neck T -1.1  9/18   Other screening mammogram    Pure hypercholesterolemia    Rosacea    Subacute confusional state 11/19/2012   Tubular adenoma of colon 01/10/11   Ulcerative colitis, unspecified    Unspecified asthma(493.90)    Unspecified essential hypertension    Unspecified hypothyroidism    Unspecified  vitamin D deficiency    Vertigo    Past Surgical History:  Procedure Laterality Date   APPENDECTOMY     AXILLARY SENTINEL NODE BIOPSY Left 05/23/2014   Procedure: AXILLARY SENTINEL NODE BIOPSY;  Surgeon: SeChristene LyeMD;  Location: [facility];  Service: General;  Laterality: Left;   BREAST BIOPSY  08/1998   on tamoxifen, atypical hyperplasia   BREAST BIOPSY Left 04/2014   BREAST SURGERY Left 08/1998   lumpectomy/ Dr CrSharlet Salina BREAST SURGERY Bilateral 05/23/2014   Mastectomy   BUNIONECTOMY Right    CARDIAC CATHETERIZATION N/A 09/05/2014   Procedure: Left Heart Cath and Coronary Angiography;  Surgeon: MuWellington HampshireMD;  [facility];  Service: Cardiovascular;  Laterality: N/A;   CARDIAC CATHETERIZATION  09/05/2014   CATARACT EXTRACTION W/ INTRAOCULAR LENS IMPLANT Bilateral    CESAREAN SECTION  1996/1997   placenta previa, gest DM, pre-eclampsia   CHOLECYSTECTOMY  1997  adhesions also   COLONOSCOPY  01/2001   Ulcerative colitis   COLONOSCOPY  04/2004   UC, polyp   COLONOSCOPY  12/2006   UC, no polyps   CORONARY STENT INTERVENTION N/A 04/06/2020   Procedure: CORONARY STENT INTERVENTION;  Surgeon: Nelva Bush, MD;  Location: [place] [facility];  Service: Cardiovascular;  Laterality: N/A;   DEXA  04/1999 and 2010   normal   ESOPHAGOGASTRODUODENOSCOPY  09/2001   polyp   exercise stress test  11/2003   negative   FEMUR FRACTURE SURGERY Left    LEFT HEART CATH AND CORONARY ANGIOGRAPHY N/A 04/06/2020   Procedure: LEFT HEART CATH AND CORONARY ANGIOGRAPHY;  Surgeon: Minna Merritts, MD;  Location: [facility];  Service: Cardiovascular;  Laterality: N/A;   MASTECTOMY Bilateral    MECKEL DIVERTICULUM EXCISION  1999   NASAL SINUS SURGERY  05/1997   nuclear stress test  06/2007   negative   precancerous mole removed     RIGHT AND LEFT HEART CATH     with 1 stent placed in March 2022   SIMPLE MASTECTOMY WITH AXILLARY SENTINEL NODE  BIOPSY Bilateral 05/23/2014   Procedure: Bilateral simple mastectomy, left sentinel node biopsy ;  Surgeon: Christene Lye, MD;  Location: [facility];  Service: General;  Laterality: Bilateral;   Sleep study  11/2007   no apnea, but did snore (done by [facility])   [place]pre[place] EXTRACTION  1980's   Social History   Tobacco Use   Smoking status: Former    Packs/day: 0.25    Years: 5.00    Pack years: 1.25    Types: Cigarettes    Quit date: 01/14/1993    Years since quitting: 27.7   Smokeless tobacco: Never  Vaping Use   Vaping Use: Never used  Substance Use Topics   Alcohol use: No    Alcohol/week: 0.0 standard drinks    Comment: Recovered ETOH   Drug use: No   Family History  Problem Relation Age of Onset   Coronary artery disease Father    Colon cancer Father    Alzheimer's disease Father    Dementia Father    Colon polyps Father    Coronary artery disease Mother    Hypertension Mother    Osteoporosis Mother    Crohn's disease Mother        crohns colitis   Breast cancer Other        great aunts   Coronary artery disease Other        Uncle (also AAA)   Diabetes Other        remote family history   Stroke Cousin    Esophageal cancer Neg Hx    Rectal cancer Neg Hx    Stomach cancer Neg Hx    Allergies  Allergen Reactions   Ephedrine Other (See Comments)    Pt becomes hyper   Aspirin     REACTION: aggrivates colitis   Crestor [Rosuvastatin]     REACTION: increased lfts   Erythromycin     REACTION: GI upset   Nsaids     REACTION: aggrivate colitis   Oxycodone Other (See Comments)    Panic Attack   Current Outpatient Medications on File Prior to Visit  Medication Sig Dispense Refill   acetaminophen (TYLENOL) 650 MG CR tablet Take 1,300 mg by mouth 2 (two) times daily.     amantadine (SYMMETREL) 100 MG capsule  Take 100 mg by mouth 2 (two) times daily.     Ascorbic Acid (VITAMIN C PO) Take 1,000 mg by mouth  daily.     aspirin 81 MG chewable tablet Chew 1 tablet (81 mg total) by mouth daily. 90 tablet 3   atorvastatin (LIPITOR) 80 MG tablet Take one pill daily 90 tablet 3   B Complex Vitamins (VITAMIN B COMPLEX PO) Take 1 tablet by mouth every other day.     BAYER MICROLET LANCETS lancets USE AS DIRECTED TO CHECK BLOOD SUGAR TWICE DAILY 200 each 2   Blood Glucose Monitoring Suppl (CONTOUR NEXT MONITOR) w/Device KIT 1 each by Does not apply route in the morning and at bedtime. Use Contour Next meter to check blood sugar twice daily. 1 kit 0   Calcium Carb-Cholecalciferol (CALTRATE 600+D3 PO) Take 1 tablet by mouth daily. 300 mg Vitamin D3     clopidogrel (PLAVIX) 75 MG tablet Take 1 tablet (75 mg total) by mouth daily. 90 tablet 3   Co-Enzyme Q-10 100 MG CAPS Take 100 mg by mouth daily.     dextroamphetamine (DEXEDRINE SPANSULE) 15 MG 24 hr capsule Take 15 mg by mouth as needed.     diazepam (VALIUM) 10 MG tablet Take 5-10 mg by mouth 2 (two) times daily as needed for anxiety.     diphenhydrAMINE (BENADRYL) 25 MG tablet Take 25-50 mg by mouth at bedtime.     docusate sodium (COLACE) 100 MG capsule Take 100-300 mg by mouth daily as needed for mild constipation or moderate constipation.     ezetimibe (ZETIA) 10 MG tablet TAKE 1 TABLET(10 MG) BY MOUTH DAILY 90 tablet 3   famotidine (PEPCID) 40 MG tablet Take 40 mg by mouth 2 (two) times daily. 30 min. Before meal and at bedtime     gabapentin (NEURONTIN) 300 MG capsule TAKE 1 CAPSULE(300 MG) BY MOUTH THREE TIMES DAILY 90 capsule 11   glucose blood (CONTOUR NEXT TEST) test strip Use as instructed 100 each 12   insulin detemir (LEVEMIR FLEXTOUCH) 100 UNIT/ML FlexPen Inject 60 Units into the skin every evening. 15 mL 5   Insulin Pen Needle (B-D UF III MINI PEN NEEDLES) 31G X 5 MM MISC USE FOR INJECTIONS TWICE DAILY 100 each 12   JARDIANCE 25 MG TABS tablet TAKE 1 TABLET(25 MG) BY MOUTH DAILY 30 tablet 3   ketoconazole (NIZORAL) 2 % cream Apply to affected  areas one to two times daily as needed. 30 g 2   lamoTRIgine (LAMICTAL) 200 MG tablet Take 400 mg by mouth daily.     latanoprost (XALATAN) 0.005 % ophthalmic solution Place 1 drop into both eyes 2 (two) times a week.      LEVOXYL 125 MCG tablet Take 2 tablets (250 mcg total) by mouth daily before breakfast. 60 tablet 3   loperamide (IMODIUM A-D) 2 MG tablet Take 2 mg by mouth daily as needed for diarrhea or loose stools.     lubiprostone (AMITIZA) 24 MCG capsule Take 1 capsule (24 mcg total) by mouth 2 (two) times daily with a meal. 60 capsule 3   MAGNESIUM CITRATE PO Take by mouth. Dose unknown     Melatonin 10 MG CAPS Take 10 mg by mouth at bedtime.     mesalamine (LIALDA) 1.2 g EC tablet TAKE 2 TABLETS BY MOUTH DAILY 60 tablet 2   metoprolol succinate (TOPROL-XL) 25 MG 24 hr tablet TAKE ONE-HALF TABLET BY MOUTH AS NEEDED AT BEDTIME FOR SYSTOLIC BLOOD PRESSURE GREATER  THAN 100 90 tablet 3   Multiple Vitamin (MULTIVITAMIN) tablet Take 1 tablet by mouth daily. Woman 50 plus     nitroGLYCERIN (NITROSTAT) 0.4 MG SL tablet Place 1 tablet (0.4 mg total) under the tongue every 5 (five) minutes as needed for chest pain. 25 tablet 4   olmesartan (BENICAR) 5 MG tablet Take 1 tablet (5 mg total) by mouth daily. 90 tablet 3   ondansetron (ZOFRAN-ODT) 4 MG disintegrating tablet DISSOLVE 1 TABLET(4 MG) ON THE TONGUE EVERY 6 HOURS AS NEEDED FOR NAUSEA OR VOMITING 90 tablet 1   pantoprazole (PROTONIX) 40 MG tablet TAKE 1 TABLET(40 MG) BY MOUTH TWICE DAILY 60 tablet 6   polyethylene glycol (MIRALAX / GLYCOLAX) 17 g packet Take 17 g by mouth 3 (three) times daily as needed for moderate constipation, mild constipation or severe constipation. In water or beverage     Probiotic Product (CULTURELLE PROBIOTICS PO) Take 1 capsule by mouth daily. For woman     QUEtiapine (SEROQUEL XR) 400 MG 24 hr tablet Take 800 mg by mouth at bedtime.     Semaglutide, 1 MG/DOSE, (OZEMPIC, 1 MG/DOSE,) 4 MG/3ML SOPN Inject 1 mg as  directed every Monday. 3 mL 5   simethicone (MYLICON) 80 MG chewable tablet Chew 80 mg by mouth 4 (four) times daily.     Tavaborole (KERYDIN) 5 % SOLN Apply 1 application topically at bedtime. Apply to affected area of left great toenail 10 mL 6   valACYclovir (VALTREX) 1000 MG tablet TAKE 1 TABLET(1000 MG) BY MOUTH DAILY 30 tablet 5   VRAYLAR capsule Take 3 mg by mouth at bedtime.     zaleplon (SONATA) 10 MG capsule Take 10-20 mg by mouth at bedtime.     No current facility-administered medications on file prior to visit.   Review of Systems  Constitutional:  Positive for malaise/fatigue. Negative for chills and fever.  HENT:  Positive for congestion, sinus pain and sore throat. Negative for ear pain.   Eyes:  Negative for blurred vision, discharge and redness.  Respiratory:  Positive for cough and sputum production. Negative for shortness of breath and stridor.   Cardiovascular:  Negative for chest pain, palpitations and leg swelling.  Gastrointestinal:  Negative for abdominal pain, diarrhea, nausea and vomiting.  Musculoskeletal:  Negative for myalgias.  Skin:  Negative for rash.  Neurological:  Positive for headaches. Negative for dizziness.    Observations/Objective: Pt was seen briefly before video failed and she appeared well,in no distress Hoarse voice No cough noted, does clear throat and sniffle No audible wheeze or sob Nl cognition , good historian Nl mood  Assessment and Plan:  Problem List Items Addressed This Visit       Respiratory   Acute sinusitis    With uri symptoms on top of allergic rhinitis Acute on chronic, facial pain and green nasal drainage Neg rapid covid test and pcr pending (will continue to isolate until this comes back) Discussed symptom control  Will stop afrin after 2-3 d Adv nasal saline  Watch for fever or wheezing Doxycycline sent to pharmacy to use as directed Update if not starting to improve in a week or if worsening  ER precautions  discussed      Relevant Medications   doxycycline (VIBRA-TABS) 100 MG tablet    Follow Up Instructions: Get your covid test back and let us know if positive  Drink fluids and rest  mucinex DM is good for cough and congestion  Nasal saline for congestion  as needed  Tylenol for fever or pain or headache  Please alert [place] if symptoms worsen (if severe or short of breath please go to the ER)    If wheezing/tight chest please let us know  Take doxycycline as directed for a sinus infection   I discussed the assessment and treatment plan with the patient. The patient was provided an opportunity to ask questions and all were answered. The patient agreed with the plan and demonstrated an understanding of the instructions.   The patient was advised to call back or seek an in-person evaluation if the symptoms worsen or if the condition fails to improve as anticipated.  I provided 16 minutes of non-face-to-face time during this encounter.   Lindsay Pardon, MD

## 2020-10-17 ENCOUNTER — Ambulatory Visit: Payer: BC Managed Care – PPO | Admitting: Endocrinology

## 2020-10-19 ENCOUNTER — Other Ambulatory Visit: Payer: Self-pay | Admitting: Internal Medicine

## 2020-11-03 ENCOUNTER — Ambulatory Visit: Payer: BC Managed Care – PPO | Admitting: Endocrinology

## 2020-11-03 ENCOUNTER — Other Ambulatory Visit: Payer: Self-pay | Admitting: *Deleted

## 2020-11-03 ENCOUNTER — Other Ambulatory Visit: Payer: Self-pay

## 2020-11-03 ENCOUNTER — Encounter: Payer: Self-pay | Admitting: Endocrinology

## 2020-11-03 VITALS — BP 142/84 | HR 74 | Ht 66.0 in | Wt 197.2 lb

## 2020-11-03 DIAGNOSIS — E7800 Pure hypercholesterolemia, unspecified: Secondary | ICD-10-CM | POA: Diagnosis not present

## 2020-11-03 DIAGNOSIS — E1169 Type 2 diabetes mellitus with other specified complication: Secondary | ICD-10-CM

## 2020-11-03 DIAGNOSIS — E669 Obesity, unspecified: Secondary | ICD-10-CM

## 2020-11-03 DIAGNOSIS — E890 Postprocedural hypothyroidism: Secondary | ICD-10-CM | POA: Diagnosis not present

## 2020-11-03 DIAGNOSIS — E559 Vitamin D deficiency, unspecified: Secondary | ICD-10-CM | POA: Diagnosis not present

## 2020-11-03 MED ORDER — METOPROLOL SUCCINATE ER 25 MG PO TB24: ORAL_TABLET | ORAL | 3 refills | Status: DC

## 2020-11-03 NOTE — Progress Notes (Signed)
Patient ID: Lindsay Fry, female   DOB: 06-04-1960, 60 y.o.   MRN: [id number]   Reason for Appointment:   followup visit  History of Present Illness:  DIABETES:  Prior history: With her high A1c of 6.7 in 03/2014 she had an abnormal glucose tolerance test indicating diabetes and 1 hour glucose of 300 She was started on metformin; however even with 1500 mg of metformin ER  blood sugars were not controlled especially fasting Subsequently with taking Janumet XR 100/1000 daily her blood sugars were much better Because of her weight gain and A1c going up to 7.3 along with occasional readings over 200 she was started on Victoza in 11/16 She has had progressive hyperglycemia previously and A1c was up to 8.6% in 4/17  She was started on Levemir insulin in 08/2015 because of progressive rise in blood sugars and inability to control with 3 other agents   Non-insulin hypoglycemic drugs: Ozempic 1 mg weekly; metformin ER 2000 mg daily, Jardiance 25 mg daily  INSULIN regimen: Levemir 60 units at bedtime  A1c is 6% and consistently improved A1c is done on 10/12/2020   Current management, blood sugar patterns and problems identified: Although her A1c is about the same she says she has likely gained  weight since her last time because of inadequate exercise  However she also seems to have some carbohydrate craving at times  Recently blood sugars overall are fairly good but overall averaging higher than before  As before she forgets to check her sugars after eating and checking mostly at various times in the mornings  No hypoglycemia overnight with lowest reading 82  She has not been able to exercise because of her pool closing  Has been regular with her Ozempic 1 mg  Has been on the same dose of Levemir as before  GLUCOSE readings from review of the CONTOUR monitor review  RECENT glucose range 92-158 before first meal with AVERAGE about 120   FASTING range 97-165, nonfasting  95-117  2-week average 117  Dinner 5-7pm  Weight history:  Wt Readings from Last 3 Encounters:  11/03/20 197 lb 3.2 oz (89.4 kg)  10/16/20 194 lb (88 kg)  10/10/20 195 lb 4 oz (88.6 kg)   Previous consultation with dietitian: 2016    LABS:  Lab Results  Component Value Date   HGBA1C 6.0 10/12/2020   HGBA1C 5.9 06/05/2020   HGBA1C 6.7 (H) 03/07/2020   Lab Results  Component Value Date   MICROALBUR <0.7 10/12/2020   Gerald 55 06/14/2020   CREATININE 0.84 10/12/2020   HYPOTHYROIDISM: This was first diagnosed in 1992 after her treatment for Graves' disease with I-131 She has been on relatively large doses of thyroxine supplements She was on 224 mcg since 11/13 and her TSH was normal in 3/14 Subjectively difficult to assess her thyroid since she tends to have fatigue chronically.  In 2014 her dose was reduced to 200 mcg daily and her dose has been fluctuating since then She had required periodic increase in dosage including in 05/2014 when her TSH was 20  She is on 250 g dosage, continues on the brand name LEVOXYL  She feels fairly good generally  She has been regular with the Levoxyl every day before breakfast on empty stomach  Levoxyl is better covered than Synthroid by insurance         TSH as follows  Lab Results  Component Value Date   TSH 3.53 10/12/2020   TSH 3.12 06/05/2020  TSH 2.87 03/07/2020   FREET4 1.04 10/12/2020   FREET4 0.58 (L) 06/05/2020   FREET4 1.01 03/07/2020     Allergies as of 11/03/2020       Reactions   Ephedrine Other (See Comments)   Pt becomes hyper   Aspirin    REACTION: aggrivates colitis   Crestor [rosuvastatin]    REACTION: increased lfts   Erythromycin    REACTION: GI upset   Nsaids    REACTION: aggrivate colitis   Oxycodone Other (See Comments)   Panic Attack        Medication List        Accurate as of November 03, 2020 11:28 AM. If you have any questions, ask your nurse or doctor.           acetaminophen 650 MG CR tablet Commonly known as: TYLENOL Take 1,300 mg by mouth 2 (two) times daily.   amantadine 100 MG capsule Commonly known as: SYMMETREL Take 100 mg by mouth 2 (two) times daily.   aspirin 81 MG chewable tablet Chew 1 tablet (81 mg total) by mouth daily.   atorvastatin 80 MG tablet Commonly known as: LIPITOR Take one pill daily   B-D UF III MINI PEN NEEDLES 31G X 5 MM Misc Generic drug: Insulin Pen Needle USE FOR INJECTIONS TWICE DAILY   Bayer Microlet Lancets lancets USE AS DIRECTED TO CHECK BLOOD SUGAR TWICE DAILY   CALTRATE 600+D3 PO Take 1 tablet by mouth daily. 300 mg Vitamin D3   clopidogrel 75 MG tablet Commonly known as: PLAVIX Take 1 tablet (75 mg total) by mouth daily.   Co-Enzyme Q-10 100 MG Caps Take 100 mg by mouth daily.   Contour Next Monitor w/Device Kit 1 each by Does not apply route in the morning and at bedtime. Use Contour Next meter to check blood sugar twice daily.   Contour Next Test test strip Generic drug: glucose blood Use as instructed   CULTURELLE PROBIOTICS PO Take 1 capsule by mouth daily. For woman   dextroamphetamine 15 MG 24 hr capsule Commonly known as: DEXEDRINE SPANSULE Take 15 mg by mouth as needed.   diazepam 10 MG tablet Commonly known as: VALIUM Take 5-10 mg by mouth 2 (two) times daily as needed for anxiety.   diphenhydrAMINE 25 MG tablet Commonly known as: BENADRYL Take 25-50 mg by mouth at bedtime.   docusate sodium 100 MG capsule Commonly known as: COLACE Take 100-300 mg by mouth daily as needed for mild constipation or moderate constipation.   doxycycline 100 MG tablet Commonly known as: VIBRA-TABS Take 1 tablet (100 mg total) by mouth 2 (two) times daily.   ezetimibe 10 MG tablet Commonly known as: ZETIA TAKE 1 TABLET(10 MG) BY MOUTH DAILY   famotidine 40 MG tablet Commonly known as: PEPCID Take 40 mg by mouth 2 (two) times daily. 30 min. Before meal and at bedtime    gabapentin 300 MG capsule Commonly known as: NEURONTIN TAKE 1 CAPSULE(300 MG) BY MOUTH THREE TIMES DAILY   Jardiance 25 MG Tabs tablet Generic drug: empagliflozin TAKE 1 TABLET(25 MG) BY MOUTH DAILY   ketoconazole 2 % cream Commonly known as: NIZORAL Apply to affected areas one to two times daily as needed.   lamoTRIgine 200 MG tablet Commonly known as: LAMICTAL Take 400 mg by mouth daily.   latanoprost 0.005 % ophthalmic solution Commonly known as: XALATAN Place 1 drop into both eyes 2 (two) times a week.   Levemir FlexTouch 100 UNIT/ML FlexPen Generic drug:  insulin detemir Inject 60 Units into the skin every evening.   Levoxyl 125 MCG tablet Generic drug: levothyroxine Take 2 tablets (250 mcg total) by mouth daily before breakfast.   loperamide 2 MG tablet Commonly known as: IMODIUM A-D Take 2 mg by mouth daily as needed for diarrhea or loose stools.   lubiprostone 24 MCG capsule Commonly known as: Amitiza Take 1 capsule (24 mcg total) by mouth 2 (two) times daily with a meal.   MAGNESIUM CITRATE PO Take by mouth. Dose unknown   Melatonin 10 MG Caps Take 10 mg by mouth at bedtime.   mesalamine 1.2 g EC tablet Commonly known as: LIALDA TAKE 2 TABLETS BY MOUTH DAILY   metoprolol succinate 25 MG 24 hr tablet Commonly known as: TOPROL-XL TAKE ONE-HALF TABLET BY MOUTH AS NEEDED AT BEDTIME FOR SYSTOLIC BLOOD PRESSURE GREATER THAN 100   multivitamin tablet Take 1 tablet by mouth daily. Woman 50 plus   nitroGLYCERIN 0.4 MG SL tablet Commonly known as: NITROSTAT Place 1 tablet (0.4 mg total) under the tongue every 5 (five) minutes as needed for chest pain.   olmesartan 5 MG tablet Commonly known as: BENICAR Take 1 tablet (5 mg total) by mouth daily.   ondansetron 4 MG disintegrating tablet Commonly known as: ZOFRAN-ODT DISSOLVE 1 TABLET(4 MG) ON THE TONGUE EVERY 6 HOURS AS NEEDED FOR NAUSEA OR VOMITING   Ozempic (1 MG/DOSE) 4 MG/3ML Sopn Generic drug:  Semaglutide (1 MG/DOSE) Inject 1 mg as directed every Monday.   pantoprazole 40 MG tablet Commonly known as: PROTONIX TAKE 1 TABLET(40 MG) BY MOUTH TWICE DAILY   polyethylene glycol 17 g packet Commonly known as: MIRALAX / GLYCOLAX Take 17 g by mouth 3 (three) times daily as needed for moderate constipation, mild constipation or severe constipation. In water or beverage   QUEtiapine 400 MG 24 hr tablet Commonly known as: SEROQUEL XR Take 800 mg by mouth at bedtime.   simethicone 80 MG chewable tablet Commonly known as: MYLICON Chew 80 mg by mouth 4 (four) times daily.   Tavaborole 5 % Soln Commonly known as: Kerydin Apply 1 application topically at bedtime. Apply to affected area of left great toenail   valACYclovir 1000 MG tablet Commonly known as: VALTREX TAKE 1 TABLET(1000 MG) BY MOUTH DAILY   VITAMIN B COMPLEX PO Take 1 tablet by mouth every other day.   VITAMIN C PO Take 1,000 mg by mouth daily.   Vraylar 3 MG capsule Generic drug: cariprazine Take 3 mg by mouth at bedtime.   zaleplon 10 MG capsule Commonly known as: SONATA Take 10-20 mg by mouth at bedtime.        Allergies:  Allergies  Allergen Reactions   Ephedrine Other (See Comments)    Pt becomes hyper   Aspirin     REACTION: aggrivates colitis   Crestor [Rosuvastatin]     REACTION: increased lfts   Erythromycin     REACTION: GI upset   Nsaids     REACTION: aggrivate colitis   Oxycodone Other (See Comments)    Panic Attack    Past Medical History:  Diagnosis Date   ADD (attention deficit disorder) 11/19/2012   Allergic rhinitis, cause unspecified    Anxiety state, unspecified    Arthritis    Asymptomatic postmenopausal status (age-related) (natural)    Atypical hyperplasia of left breast 2000   Atypical mole 03/16/2018   R paraspinal mid upper back, excision   Atypical mole 02/17/2018   R spinal upper back,  moderate   Atypical mole 08/11/2017   L sternum, excision   Atypical mole  05/27/2017   L post neck, moderate   Benign neoplasm of other and unspecified site of the digestive system    Bipolar disorder (HCC)    Breast cancer (HCC)    Left Breast 97m invasive CA left uiq, dcis left uoq and a lot ADH, ALH in both breasts.   CAD (coronary artery disease)    a. 08/2014 NSTEMI/Cath: LM nl, LAD 50p, D1/D2 min irregs, LCX nl, OM1 40, LPDA nl, RI 99ost (2.040mvessel->med Rx), RCA nl, AM 60, nl EF; b. 11/2016 MV: EF 74%, no ischemia (performed 2/2 dyspnea & new inf TWI).   Carcinoma of left breast ([place]5/19/2016   Cataract    Chronic diastolic CHF (congestive heart failure) (HCT[place]za   a. 08/2014 Echo: EF 60-65%, Gr 1 DD, nl LA.   Clotting disorder (HCS[place]   on blood thiner, Plavix   Cyst    on Achilles tendon   Depression    Diabetes mellitus ([place]   frank   Dysuria    Edema    Family history of malignant neoplasm of gastrointestinal tract    Fibromyalgia    GERD (gastroesophageal reflux disease)    Glaucoma    pre glaucoma, 05/14/2018 pt. denies glaucoma   Headache    migraines   Hiatal hernia    History of alcoholism (HCRefugio   History of migraines    History of ovarian cyst    Hx of adenomatous colonic polyps    Insomnia, unspecified    Myocardial infarction (HCMonona8-21-16   OCD (obsessive compulsive disorder)    Osteopenia 09/25/2016   Femoral neck T -1.1  9/18   Other screening mammogram    Pure hypercholesterolemia    Rosacea    Subacute confusional state 11/19/2012   Tubular adenoma of colon 01/10/11   Ulcerative colitis, unspecified    Unspecified asthma(493.90)    Unspecified essential hypertension    Unspecified hypothyroidism    Unspecified vitamin D deficiency    Vertigo     Past Surgical History:  Procedure Laterality Date   APPENDECTOMY     AXILLARY SENTINEL NODE BIOPSY Left 05/23/2014   Procedure: AXILLARY SENTINEL NODE BIOPSY;  Surgeon: SeChristene LyeMD;  Location: [facility];  Service: General;  Laterality: Left;   BREAST BIOPSY   08/1998   on tamoxifen, atypical hyperplasia   BREAST BIOPSY Left 04/2014   BREAST SURGERY Left 08/1998   lumpectomy/ Dr CrSharlet Salina BREAST SURGERY Bilateral 05/23/2014   Mastectomy   BUNIONECTOMY Right    CARDIAC CATHETERIZATION N/A 09/05/2014   Procedure: Left Heart Cath and Coronary Angiography;  Surgeon: MuWellington HampshireMD;  [facility];  Service: Cardiovascular;  Laterality: N/A;   CARDIAC CATHETERIZATION  09/05/2014   CATARACT EXTRACTION W/ INTRAOCULAR LENS IMPLANT Bilateral    CESAREAN SECTION  1996/1997   placenta previa, gest DM, pre-eclampsia   CHOLECYSTECTOMY  1997   adhesions also   COLONOSCOPY  01/2001   Ulcerative colitis   COLONOSCOPY  04/2004   UC, polyp   COLONOSCOPY  12/2006   UC, no polyps   CORONARY STENT INTERVENTION N/A 04/06/2020   Procedure: CORONARY STENT INTERVENTION;  Surgeon: EnNelva BushMD;  [facility];  Service: Cardiovascular;  Laterality: N/A;   DEXA  04/1999 and 2010   normal   ESOPHAGOGASTRODUODENOSCOPY  09/2001   polyp   exercise  stress test  11/2003   negative   FEMUR FRACTURE SURGERY Left    LEFT HEART CATH AND CORONARY ANGIOGRAPHY N/A 04/06/2020   Procedure: LEFT HEART CATH AND CORONARY ANGIOGRAPHY;  Surgeon: Minna Merritts, MD;  Location: [place] [facility];  Service: Cardiovascular;  Laterality: N/A;   MASTECTOMY Bilateral    MECKEL DIVERTICULUM EXCISION  1999   NASAL SINUS SURGERY  05/1997   nuclear stress test  06/2007   negative   precancerous mole removed     RIGHT AND LEFT HEART CATH     with 1 stent placed in March 2022   SIMPLE MASTECTOMY WITH AXILLARY SENTINEL NODE BIOPSY Bilateral 05/23/2014   Procedure: Bilateral simple mastectomy, left sentinel node biopsy ;  Surgeon: Christene Lye, MD;  Location: [facility];  Service: General;  Laterality: Bilateral;   Sleep study  11/2007   no apnea, but did snore (done by [facility])   [place] EXTRACTION  56's    Family History  Problem Relation Age of Onset   Coronary artery disease Father    Colon cancer Father    Alzheimer's disease Father    Dementia Father    Colon polyps Father    Coronary artery disease Mother    Hypertension Mother    Osteoporosis Mother    Crohn's disease Mother        crohns colitis   Breast cancer Other        great aunts   Coronary artery disease Other        Uncle (also AAA)   Diabetes Other        remote family history   Stroke Cousin    Esophageal cancer Neg Hx    Rectal cancer Neg Hx    Stomach cancer Neg Hx     Social History:  reports that she quit smoking about 27 years ago. Her smoking use included cigarettes. She has a 1.25 pack-year smoking history. She has never used smokeless tobacco. She reports that she does not drink alcohol and does not use drugs.  REVIEW Of SYSTEMS:  Bone health: She is taking OTC vitamin D3, ? 2000 units and has been on calcium recommended by her oncologist Vitamin D level therapeutic  Lab Results  Component Value Date   VD25OH 43.86 03/24/2020   VD25OH 41.98 06/03/2019    Bone density in 9/15 shows T score -1.1 at the hip  Lab Results  Component Value Date   PTH 18 10/25/2016   CALCIUM 9.3 10/12/2020   PHOS 2.3 01/29/2007     Lab Results  Component Value Date   CALCIUM 9.3 10/12/2020   PHOS 2.3 01/29/2007   Lab Results  Component Value Date   VD25OH 43.86 03/24/2020   VD25OH 41.98 06/03/2019   VD25OH 39.94 04/21/2019     HYPERTENSION: Blood pressure is managed by cardiologist.   Now taking 5 mg Benicar but will skip the dose if blood pressure is low  Lab Results  Component Value Date   CREATININE 0.84 10/12/2020   BUN 17 10/12/2020   NA 139 10/12/2020   K 4.2 10/12/2020   CL 101 10/12/2020   CO2 29 10/12/2020    History of hypercholesterolemia with known CAD: Management by cardiologist and has been on 80 mg atorvastatin since her MI She is also  taking Zetia that was added subsequently Repatha was approved but she was told by cardiologist not to  start this  Recent LDL levels have been below 70  Lab Results  Component Value Date   CHOL 122 06/14/2020   CHOL 120 03/07/2020   CHOL 181 12/13/2019   Lab Results  Component Value Date   HDL 45.80 06/14/2020   HDL 40.30 03/07/2020   HDL 50.20 12/13/2019   Lab Results  Component Value Date   LDLCALC 55 06/14/2020   LDLCALC 46 03/07/2020   LDLCALC 74 02/05/2019   Lab Results  Component Value Date   TRIG 106.0 06/14/2020   TRIG 172.0 (H) 03/07/2020   TRIG 216.0 (H) 12/13/2019   Lab Results  Component Value Date   CHOLHDL 3 06/14/2020   CHOLHDL 3 03/07/2020   CHOLHDL 4 12/13/2019   Lab Results  Component Value Date   LDLDIRECT 125.0 02/07/2020   LDLDIRECT 109.0 12/13/2019   LDLDIRECT 86.0 09/06/2019     Likely fatty liver: Liver functions show variable results   Lab Results  Component Value Date   ALT 37 (H) 10/12/2020       Examination:   BP (!) 142/84   Pulse 74   Ht _0  (1.676 m)   Wt 197 lb 3.2 oz (89.4 kg)   LMP 08/23/2006 (Approximate) Comment: tubal ligation  SpO2 99%   BMI 31.83 kg/m   Diabetic Foot Exam - Simple   Simple Foot Form Diabetic Foot exam was performed with the following findings: Yes   Visual Inspection No deformities, no ulcerations, no other skin breakdown bilaterally: Yes Sensation Testing Intact to touch and monofilament testing bilaterally: Yes Pulse Check Posterior Tibialis and Dorsalis pulse intact bilaterally: Yes Comments        Assessments   DIABETES with obesity:  See history of present illness for evaluation of  current management, blood sugar patterns and problems identified  Her A1c is stable at 6%   She is on a regimen of basal insulin using 60 units of Levemir, Jardiance and Ozempic 1 mg  Although she is not monitoring after meals her blood sugars are overall still fairly well controlled before  her first meal in the daytime She has gained some weight since her last visit mostly because of lack of exercise She also complains of carbohydrate cravings at times However she thinks her portion control is good  Reminded her to check her blood sugar readings after her main meal in the evening to help adjust her diet better She can also try and resume exercise if she is able to find a facility for water exercises now No change in insulin May consider increasing Ozempic to 2 mg when available or alternatively consider Mounjaro  Hypothyroidism, post ablative:  TSH has been consistently normal As before her symptoms of fatigue fluctuate based on intercurrent illnesses and health problems  Continue 250 mg of LEVOXYL every morning  LIPIDS: Again LDL has been recently below 70 and will not start Repatha which she is approved for, continue Crestor and Zetia  Follow-up in 4 months  She will call if she has any issues with abnormal glucose consistently  Reminded her to consider taking the flu shot which she refused today  There are no Patient Instructions on file for this visit.    Elayne Snare 11/03/2020, 11:28 AM   Note: This office note was prepared with Dragon voice recognition system technology. Any transcriptional errors that result from this process are unintentional.

## 2020-11-06 DIAGNOSIS — Z20822 Contact with and (suspected) exposure to covid-19: Secondary | ICD-10-CM | POA: Diagnosis not present

## 2020-11-06 DIAGNOSIS — Z03818 Encounter for observation for suspected exposure to other biological agents ruled out: Secondary | ICD-10-CM | POA: Diagnosis not present

## 2020-11-07 ENCOUNTER — Telehealth (INDEPENDENT_AMBULATORY_CARE_PROVIDER_SITE_OTHER): Payer: BC Managed Care – PPO | Admitting: Family Medicine

## 2020-11-07 ENCOUNTER — Other Ambulatory Visit: Payer: Self-pay

## 2020-11-07 ENCOUNTER — Encounter: Payer: Self-pay | Admitting: Family Medicine

## 2020-11-07 VITALS — BP 139/88 | HR 78 | Wt 190.0 lb

## 2020-11-07 DIAGNOSIS — J40 Bronchitis, not specified as acute or chronic: Secondary | ICD-10-CM

## 2020-11-07 DIAGNOSIS — J0140 Acute pansinusitis, unspecified: Secondary | ICD-10-CM

## 2020-11-07 MED ORDER — PROAIR RESPICLICK 108 (90 BASE) MCG/ACT IN AEPB: 1.0000 | INHALATION_SPRAY | RESPIRATORY_TRACT | 1 refills | Status: DC | PRN

## 2020-11-07 MED ORDER — DOXYCYCLINE HYCLATE 100 MG PO TABS: 100.0000 mg | ORAL_TABLET | Freq: Two times a day (BID) | ORAL | 0 refills | Status: AC

## 2020-11-07 MED ORDER — BENZONATATE 100 MG PO CAPS: 100.0000 mg | ORAL_CAPSULE | Freq: Three times a day (TID) | ORAL | 0 refills | Status: DC | PRN

## 2020-11-07 NOTE — Progress Notes (Signed)
I connected with Lindsay Fry on 11/07/20 at 12:00 PM EDT by video and verified that I am speaking with the correct person using two identifiers.   I discussed the limitations, risks, security and privacy concerns of performing an evaluation and management service by video and the availability of in person appointments. I also discussed with the patient that there may be a patient responsible charge related to this service. The patient expressed understanding and agreed to proceed.  Patient location: Home Provider Location: [place] Participants: Lindsay Fry and Lindsay Fry   Subjective:     Lindsay Fry is a 60 y.o. female presenting for Nasal Congestion (Symptoms started on Friday evening. PCR Covid results are negative (11/06/20)), Cough, Generalized Body Aches, Fatigue, and Fever (99.4 temporal)     Cough The cough is Productive of purulent sputum and productive of blood-tinged sputum. Associated symptoms include a fever, myalgias, nasal congestion, shortness of breath and wheezing. Pertinent negatives include no chest pain, chills, ear congestion, headaches or sore throat.  Fever  Associated symptoms include congestion, coughing and wheezing. Pertinent negatives include no chest pain, headaches or sore throat.   Was seen 10/16/2020 and treated for sinus infection with doxycycline  No copd or asthma Has used albuterol with colds before  Review of Systems  Constitutional:  Positive for fatigue and fever. Negative for chills.  HENT:  Positive for congestion. Negative for sinus pressure, sinus pain and sore throat.   Respiratory:  Positive for cough, shortness of breath and wheezing.   Cardiovascular:  Negative for chest pain.  Musculoskeletal:  Positive for myalgias.  Neurological:  Negative for headaches.    Social History   Tobacco Use  Smoking Status Former   Packs/day: 0.25   Years: 5.00   Pack years: 1.25   Types: Cigarettes   Quit  date: 01/14/1993   Years since quitting: 27.8  Smokeless Tobacco Never        Objective:   BP Readings from Last 3 Encounters:  11/07/20 139/88  11/03/20 (!) 142/84  10/10/20 130/80   Wt Readings from Last 3 Encounters:  11/07/20 190 lb (86.2 kg)  11/03/20 197 lb 3.2 oz (89.4 kg)  10/16/20 194 lb (88 kg)    BP 139/88   Pulse 78   Wt 190 lb (86.2 kg)   LMP 08/23/2006 (Approximate) Comment: tubal ligation  BMI 30.67 kg/m   Physical Exam Constitutional:      Appearance: Normal appearance. She is not ill-appearing.  HENT:     Head: Normocephalic and atraumatic.     Comments: Hoarse voice    Right Ear: External ear normal.     Left Ear: External ear normal.  Eyes:     Conjunctiva/sclera: Conjunctivae normal.  Pulmonary:     Effort: Pulmonary effort is normal. No respiratory distress.  Neurological:     Mental Status: She is alert. Mental status is at baseline.  Psychiatric:        Mood and Affect: Mood normal.        Behavior: Behavior normal.        Thought Content: Thought content normal.        Judgment: Judgment normal.           Assessment & Plan:   Problem List Items Addressed This Visit       Respiratory   Acute sinusitis   Relevant Medications   doxycycline (VIBRA-TABS) 100 MG tablet   benzonatate (TESSALON PERLES) 100 MG  capsule   Albuterol Sulfate (PROAIR RESPICLICK) 102 (90 Base) MCG/ACT AEPB   Other Visit Diagnoses     Bronchitis    -  Primary   Relevant Medications   doxycycline (VIBRA-TABS) 100 MG tablet   benzonatate (TESSALON PERLES) 100 MG capsule   Albuterol Sulfate (PROAIR RESPICLICK) 725 (90 Base) MCG/ACT AEPB      Concerning for possible pneumonia vs sinusitis Albuterol given prior improvement in wheezing Tessalon pearls for cough Abx to double cover  ER precautions Call back precautions   Return if symptoms worsen or fail to improve.  Lindsay Noe, MD

## 2020-11-08 ENCOUNTER — Telehealth: Payer: Self-pay

## 2020-11-08 NOTE — Telephone Encounter (Signed)
PA needed for Albuterol inhaler-was prescribed by Dr Einar Pheasant. Submitted through covermymeds. Awaiting reply  Key: [id number] - Rx #: [id number]  Your information has been submitted to [place]. [employer] [place] will review the request and notify you of the determination decision directly, typically within 72 hours of receiving all information.  You will also receive your request decision electronically. To check for an update later, open this request again from your dashboard.  If [employer] has not responded within the specified timeframe or if you have any questions about your PA submission, contact [place] directly at [phone].

## 2020-11-09 NOTE — Telephone Encounter (Signed)
PA was cancelled by the system, due to patient already picked RX up and claim was submitted,nothing further is needed at this time

## 2020-11-16 ENCOUNTER — Ambulatory Visit: Payer: BC Managed Care – PPO | Admitting: Endocrinology

## 2020-11-22 DIAGNOSIS — E1169 Type 2 diabetes mellitus with other specified complication: Secondary | ICD-10-CM

## 2020-11-23 ENCOUNTER — Encounter: Payer: Self-pay | Admitting: Family Medicine

## 2020-11-23 MED ORDER — LEVEMIR FLEXTOUCH 100 UNIT/ML ~~LOC~~ SOPN: 60.0000 [IU] | PEN_INJECTOR | Freq: Every evening | SUBCUTANEOUS | 5 refills | Status: DC

## 2020-11-26 ENCOUNTER — Other Ambulatory Visit: Payer: Self-pay | Admitting: Internal Medicine

## 2020-11-26 ENCOUNTER — Other Ambulatory Visit: Payer: Self-pay | Admitting: Endocrinology

## 2020-11-27 ENCOUNTER — Other Ambulatory Visit: Payer: Self-pay | Admitting: Internal Medicine

## 2020-11-29 ENCOUNTER — Other Ambulatory Visit: Payer: Self-pay | Admitting: Endocrinology

## 2020-12-04 ENCOUNTER — Ambulatory Visit: Payer: BC Managed Care – PPO | Admitting: Endocrinology

## 2020-12-05 ENCOUNTER — Ambulatory Visit: Payer: BC Managed Care – PPO | Admitting: Family

## 2020-12-05 ENCOUNTER — Encounter: Payer: Self-pay | Admitting: Family

## 2020-12-05 ENCOUNTER — Other Ambulatory Visit: Payer: Self-pay

## 2020-12-05 VITALS — BP 130/82 | HR 105 | Temp 97.7°F | Ht 66.0 in | Wt 200.0 lb

## 2020-12-05 DIAGNOSIS — S60221A Contusion of right hand, initial encounter: Secondary | ICD-10-CM

## 2020-12-05 DIAGNOSIS — J302 Other seasonal allergic rhinitis: Secondary | ICD-10-CM | POA: Diagnosis not present

## 2020-12-05 DIAGNOSIS — I10 Essential (primary) hypertension: Secondary | ICD-10-CM

## 2020-12-05 LAB — CBC WITH DIFFERENTIAL/PLATELET
Basophils Absolute: 0.1 10*3/uL (ref 0.0–0.1)
Basophils Relative: 0.9 % (ref 0.0–3.0)
Eosinophils Absolute: 0.2 10*3/uL (ref 0.0–0.7)
Eosinophils Relative: 4 % (ref 0.0–5.0)
HCT: 44.5 % (ref 36.0–46.0)
Hemoglobin: 14.7 g/dL (ref 12.0–15.0)
Lymphocytes Relative: 32.6 % (ref 12.0–46.0)
Lymphs Abs: 2 10*3/uL (ref 0.7–4.0)
MCHC: 32.9 g/dL (ref 30.0–36.0)
MCV: 94.1 fl (ref 78.0–100.0)
Monocytes Absolute: 0.5 10*3/uL (ref 0.1–1.0)
Monocytes Relative: 8.9 % (ref 3.0–12.0)
Neutro Abs: 3.2 10*3/uL (ref 1.4–7.7)
Neutrophils Relative %: 53.6 % (ref 43.0–77.0)
Platelets: 249 10*3/uL (ref 150.0–400.0)
RBC: 4.73 Mil/uL (ref 3.87–5.11)
RDW: 13.9 % (ref 11.5–15.5)
WBC: 6.1 10*3/uL (ref 4.0–10.5)

## 2020-12-05 MED ORDER — LORATADINE 10 MG PO TABS: 10.0000 mg | ORAL_TABLET | Freq: Every day | ORAL | 0 refills | Status: DC

## 2020-12-05 MED ORDER — FLUTICASONE PROPIONATE 50 MCG/ACT NA SUSP: 1.0000 | Freq: Every day | NASAL | 6 refills | Status: AC

## 2020-12-05 NOTE — Progress Notes (Signed)
Subjective:     Patient ID: Lindsay Fry, female    DOB: 05-Jan-1961, 60 y.o.   MRN: [id number]  Chief Complaint  Patient presents with   sinus drainage    HPI  Patient is in today for c/o left hand injury x 3 weeks ago. Intitally bruising and swelling on left hand and wrist, but has leftover 'knot' on left hand and slow to heal.hard to grab and hold onto anything without pain with her fingers. If she squeezes her fist for a long period it causes increased dull achy pain.   10/3 saw pcp, given nasal saline, doxycyline for sinusitis 1/25 saw pcp again, given doxycycline tessalon perrles and proair prn, sx improved for about 8 days, now returning.   Now every am waking up with prn and coughing up green sputum that is very thick. Does not take any daily medication for allergies. No sinus pressure, chest congestion and or fever. No nasal congestion or ear pain.  Elevated blood pressure at visit, however, pt did not take blood pressure medication yet today but will when she gets home. Denies cp palp and or sob. Average at home bp is 130/90. No blurry vision.   Health Maintenance Due  Topic Date Due   Zoster Vaccines- Shingrix (1 of 2) Never done   Pneumococcal Vaccine 69-60 Years old (3 - PCV) 10/15/2017   COVID-19 Vaccine (3 - Pfizer risk series) 08/06/2019   PAP SMEAR-Modifier  01/31/2020   COLONOSCOPY (Pts 45-27yr Insurance coverage will need to be confirmed)  05/13/2020   INFLUENZA VACCINE  08/14/2020   TETANUS/TDAP  12/04/2020    Past Medical History:  Diagnosis Date   ADD (attention deficit disorder) 11/19/2012   Allergic rhinitis, cause unspecified    Anxiety state, unspecified    Arthritis    Asymptomatic postmenopausal status (age-related) (natural)    Atypical hyperplasia of left breast 2000   Atypical mole 03/16/2018   R paraspinal mid upper back, excision   Atypical mole 02/17/2018   R spinal upper back, moderate   Atypical mole 08/11/2017   L sternum,  excision   Atypical mole 05/27/2017   L post neck, moderate   Benign neoplasm of other and unspecified site of the digestive system    Bipolar disorder ([place]    Breast cancer ([place]    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   CAD (coronary artery disease)    a. 08/2014 NSTEMI/Cath: LM nl, LAD 50p, D1/D2 min irregs, LCX nl, OM1 40, LPDA nl, RI 99ost (2.5m18messel->med Rx), RCA nl, AM 60, nl EF; b. 11/2016 MV: EF 74%, no ischemia (performed 2/2 dyspnea & new inf TWI).   C[place] of left breast ([place]/19/2016   Cataract    Chronic diastolic CHF (congestive heart failure) ([place]  a. 08/2014 Echo: EF 60-65%, Gr 1 DD, nl LA.   Clotting disorder ([facility]  on blood thiner, Plavix   Cyst    on Achilles tendon   Depression    Diabetes mellitus ([facility]  frank   Dysuria    Edema    Family history of malignant neoplasm of gastrointestinal tract    Fibromyalgia    GERD (gastroesophageal reflux disease)    Glaucoma    pre glaucoma, 05/14/2018 pt. denies glaucoma   Headache    migraines   Hiatal hernia    History of alcoholism ([place]  History  of migraines    History of ovarian cyst    Hx of adenomatous colonic polyps    Insomnia, unspecified    Myocardial infarction ([place]) 09-04-14   OCD (obsessive compulsive disorder)    Osteopenia 09/25/2016   Femoral neck T -1.1  9/18   Other screening mammogram    Pure hypercholesterolemia    Rosacea    Subacute confusional state 11/19/2012   Tubular adenoma of colon 01/10/11   Ulcerative colitis, unspecified    Unspecified asthma(493.90)    Unspecified essential hypertension    Unspecified hypothyroidism    Unspecified vitamin D deficiency    Vertigo     Past Surgical History:  Procedure Laterality Date   APPENDECTOMY     AXILLARY SENTINEL NODE BIOPSY Left 05/23/2014   Procedure: AXILLARY SENTINEL NODE BIOPSY;  Surgeon: Christene Lye, MD;  Location: [facility];  Service: General;   Laterality: Left;   BREAST BIOPSY  08/1998   on tamoxifen, atypical hyperplasia   BREAST BIOPSY Left 04/2014   BREAST SURGERY Left 08/1998   lumpectomy/ Dr Sharlet Salina   BREAST SURGERY Bilateral 05/23/2014   Mastectomy   BUNIONECTOMY Right    CARDIAC CATHETERIZATION N/A 09/05/2014   Procedure: Left Heart Cath and Coronary Angiography;  Surgeon: Wellington Hampshire, MD;  Location: [place] [facility];  Service: Cardiovascular;  Laterality: N/A;   CARDIAC CATHETERIZATION  09/05/2014   CATARACT EXTRACTION W/ INTRAOCULAR LENS IMPLANT Bilateral    CESAREAN SECTION  1996/1997   placenta previa, gest DM, pre-eclampsia   CHOLECYSTECTOMY  1997   adhesions also   COLONOSCOPY  01/2001   Ulcerative colitis   COLONOSCOPY  04/2004   UC, polyp   COLONOSCOPY  12/2006   UC, no polyps   CORONARY STENT INTERVENTION N/A 04/06/2020   Procedure: CORONARY STENT INTERVENTION;  Surgeon: Nelva Bush, MD;  Location: [place] [facility];  Service: Cardiovascular;  Laterality: N/A;   DEXA  04/1999 and 2010   normal   ESOPHAGOGASTRODUODENOSCOPY  09/2001   polyp   exercise stress test  11/2003   negative   FEMUR FRACTURE SURGERY Left    LEFT HEART CATH AND CORONARY ANGIOGRAPHY N/A 04/06/2020   Procedure: LEFT HEART CATH AND CORONARY ANGIOGRAPHY;  Surgeon: Minna Merritts, MD;  Location: [place] [facility];  Service: Cardiovascular;  Laterality: N/A;   MASTECTOMY Bilateral    MECKEL DIVERTICULUM EXCISION  1999   NASAL SINUS SURGERY  05/1997   nuclear stress test  06/2007   negative   precancerous mole removed     RIGHT AND LEFT HEART CATH     with 1 stent placed in March 2022   SIMPLE MASTECTOMY WITH AXILLARY SENTINEL NODE BIOPSY Bilateral 05/23/2014   Procedure: Bilateral simple mastectomy, left sentinel node biopsy ;  Surgeon: Christene Lye, MD;  Location: [facility];  Service: General;  Laterality: Bilateral;   Sleep study  11/2007   no apnea, but did snore (done by [facility])    [place] EXTRACTION  96's    Family History  Problem Relation Age of Onset   Coronary artery disease Father    Colon cancer Father    Alzheimer's disease Father    Dementia Father    Colon polyps Father    Coronary artery disease Mother    Hypertension Mother    Osteoporosis Mother    Crohn's disease Mother        crohns colitis  Breast cancer Other        great aunts   Coronary artery disease Other        Uncle (also AAA)   Diabetes Other        remote family history   Stroke Cousin    Esophageal cancer Neg Hx    Rectal cancer Neg Hx    Stomach cancer Neg Hx     Social History   Socioeconomic History   Marital status: Married    Spouse name: alan   Number of children: 2   Years of education: college   Highest education level: Not on file  Occupational History   Occupation: unemployed  Tobacco Use   Smoking status: Former    Packs/day: 0.25    Years: 5.00    Pack years: 1.25    Types: Cigarettes    Quit date: 01/14/1993    Years since quitting: 27.9   Smokeless tobacco: Never  Vaping Use   Vaping Use: Never used  Substance and Sexual Activity   Alcohol use: No    Alcohol/week: 0.0 standard drinks    Comment: Recovered ETOH   Drug use: No   Sexual activity: Not Currently    Partners: Male  Other Topics Concern   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      Albuterol Sulfate (PROAIR RESPICLICK) 681 (90 Base) MCG/ACT AEPB Inhale 1 puff into the lungs every 4 (four) hours as needed (wheezing). 1 each 1   amantadine (SYMMETREL) 100 MG capsule Take 100 mg by mouth 2 (two) times daily.     Ascorbic Acid (VITAMIN C PO) Take 1,000 mg by mouth daily.     aspirin 81 MG chewable tablet Chew 1 tablet (81 mg total) by mouth daily. 90 tablet 3   atorvastatin (LIPITOR) 80 MG tablet Take one pill daily 90 tablet 3   B Complex Vitamins (VITAMIN B COMPLEX PO) Take 1 tablet by mouth every other day.     BAYER MICROLET LANCETS lancets USE AS DIRECTED TO CHECK BLOOD SUGAR TWICE DAILY 200 each 2   benzonatate (TESSALON PERLES) 100 MG capsule Take 1 capsule (100 mg total) by mouth 3 (three) times daily as needed. 20 capsule 0   Blood Glucose Monitoring Suppl (CONTOUR NEXT MONITOR) w/Device KIT 1 each by Does not apply route in the morning and at bedtime. Use Contour Next meter to check blood sugar twice daily. 1 kit 0   Calcium Carb-Cholecalciferol (CALTRATE 600+D3 PO) Take 1 tablet by mouth daily. 300 mg Vitamin D3     clopidogrel (PLAVIX) 75 MG tablet Take 1 tablet (75 mg total) by mouth daily. 90 tablet 3   Co-Enzyme Q-10 100 MG CAPS Take 100 mg by mouth daily.     dextroamphetamine (DEXEDRINE SPANSULE) 15 MG 24 hr capsule Take  15 mg by mouth as needed.     diazepam (VALIUM) 10 MG tablet Take 5-10 mg by mouth 2 (two) times daily as needed for anxiety.     diphenhydrAMINE (BENADRYL) 25 MG tablet Take 25-50 mg by mouth at bedtime.     docusate sodium (COLACE) 100 MG capsule Take 100-300 mg by mouth daily as needed for mild constipation or moderate constipation.     ezetimibe (ZETIA) 10 MG tablet TAKE 1 TABLET(10 MG) BY MOUTH DAILY 90 tablet 3   famotidine (PEPCID) 40 MG tablet Take 40 mg by mouth 2 (two) times daily. 30 min. Before meal and at bedtime     gabapentin (NEURONTIN) 300 MG capsule TAKE 1 CAPSULE(300 MG) BY MOUTH THREE TIMES DAILY 90 capsule 11   glucose  blood (CONTOUR NEXT TEST) test strip USE AS DIRECTED 100 strip 2   insulin detemir (LEVEMIR FLEXTOUCH) 100 UNIT/ML FlexPen Inject 60 Units into the skin every evening. 15 mL 5   Insulin Pen Needle (B-D UF III MINI PEN NEEDLES) 31G X 5 MM MISC USE FOR INJECTIONS TWICE DAILY 100 each 12   JARDIANCE 25 MG TABS tablet TAKE 1 TABLET(25 MG) BY MOUTH DAILY 30 tablet 3   ketoconazole (NIZORAL) 2 % cream Apply to affected areas one to two times daily as needed. 30 g 2   lamoTRIgine (LAMICTAL) 200 MG tablet Take 400 mg by mouth daily.     latanoprost (XALATAN) 0.005 % ophthalmic solution Place 1 drop into both eyes 2 (two) times a week.      LEVOXYL 125 MCG tablet Take 2 tablets (250 mcg total) by mouth daily before breakfast. 60 tablet 3   loperamide (IMODIUM A-D) 2 MG tablet Take 2 mg by mouth daily as needed for diarrhea or loose stools.     Melatonin 10 MG CAPS Take 10 mg by mouth at bedtime.     mesalamine (LIALDA) 1.2 g EC tablet TAKE 2 TABLETS BY MOUTH DAILY 180 tablet 1   metoprolol succinate (TOPROL-XL) 25 MG 24 hr tablet TAKE ONE-HALF TABLET BY MOUTH AS NEEDED AT BEDTIME FOR SYSTOLIC BLOOD PRESSURE GREATER THAN 100 45 tablet 3   Multiple Vitamin (MULTIVITAMIN) tablet Take 1 tablet by mouth daily. Woman 50 plus     nitroGLYCERIN (NITROSTAT) 0.4 MG SL tablet Place 1 tablet (0.4 mg total) under the tongue every 5 (five) minutes as needed for chest pain. 25 tablet 4   olmesartan (BENICAR) 5 MG tablet Take 1 tablet (5 mg total) by mouth daily. 90 tablet 3   ondansetron (ZOFRAN-ODT) 4 MG disintegrating tablet DISSOLVE 1 TABLET(4 MG) ON THE TONGUE EVERY 6 HOURS AS NEEDED FOR NAUSEA OR VOMITING 90 tablet 1   pantoprazole (PROTONIX) 40 MG tablet TAKE 1 TABLET(40 MG) BY MOUTH TWICE DAILY 180 tablet 1   polyethylene glycol (MIRALAX / GLYCOLAX) 17 g packet Take 17 g by mouth 3 (three) times daily as needed for moderate constipation, mild constipation or severe constipation. In water or beverage     Probiotic  Product (CULTURELLE PROBIOTICS PO) Take 1 capsule by mouth daily. For woman     QUEtiapine (SEROQUEL XR) 400 MG 24 hr tablet Take 800 mg by mouth at bedtime.     Semaglutide, 1 MG/DOSE, (OZEMPIC, 1 MG/DOSE,) 4 MG/3ML SOPN Inject 1 mg as directed every Monday. 3 mL 5   simethicone (MYLICON) 80 MG chewable tablet Chew 80 mg by mouth 4 (four) times daily.     Tavaborole (KERYDIN) 5 % SOLN Apply  1 application topically at bedtime. Apply to affected area of left great toenail 10 mL 6   valACYclovir (VALTREX) 1000 MG tablet TAKE 1 TABLET(1000 MG) BY MOUTH DAILY 30 tablet 5   VRAYLAR capsule Take 3 mg by mouth at bedtime.     zaleplon (SONATA) 10 MG capsule Take 10-20 mg by mouth at bedtime.     lubiprostone (AMITIZA) 24 MCG capsule Take 1 capsule (24 mcg total) by mouth 2 (two) times daily with a meal. 60 capsule 3   MAGNESIUM CITRATE PO Take by mouth. Dose unknown     No facility-administered medications prior to visit.    Allergies  Allergen Reactions   Ephedrine Other (See Comments)    Pt becomes hyper   Aspirin     REACTION: aggrivates colitis   Crestor [Rosuvastatin]     REACTION: increased lfts   Erythromycin     REACTION: GI upset   Nsaids     REACTION: aggrivate colitis   Oxycodone Other (See Comments)    Panic Attack    Review of Systems  Constitutional:  Negative for chills and fever.  HENT:  Negative for congestion (post nasal drip), ear pain, hearing loss and sore throat.   Respiratory:  Positive for cough. Negative for shortness of breath. Sputum production: cough only in am with clear sputum.  Cardiovascular:  Negative for chest pain.  Musculoskeletal:  Positive for joint pain (left anterior hand with raised hematoma, pt hit her hand on the wall , on plavix. hard to hold on to objects).  Endo/Heme/Allergies:  Bruises/bleeds easily (pt curently on plavix).      Objective:    Physical Exam Constitutional:      General: She is not in acute distress.    Appearance:  Normal appearance. She is obese. She is not ill-appearing.  HENT:     Head: Normocephalic.     Right Ear: Tympanic membrane normal.     Left Ear: Tympanic membrane normal.     Nose: Rhinorrhea present. No congestion.     Right Turbinates: Pale.     Left Turbinates: Pale.     Right Sinus: No maxillary sinus tenderness or frontal sinus tenderness.     Left Sinus: No maxillary sinus tenderness or frontal sinus tenderness.     Mouth/Throat:     Mouth: Mucous membranes are moist.     Tongue: No lesions.  Eyes:     Pupils: Pupils are equal, round, and reactive to light.  Cardiovascular:     Rate and Rhythm: Normal rate and regular rhythm.  Pulmonary:     Effort: Pulmonary effort is normal.     Breath sounds: Normal breath sounds.  Musculoskeletal:     Right hand: Normal.     Left hand: Swelling (hematoma anterior mid hand nontender) present. No deformity or tenderness. Normal range of motion. Normal strength.       Arms:  Neurological:     Mental Status: She is alert.    BP 130/82   Pulse (!) 105   Temp 97.7 F (36.5 C) (Temporal)   Ht 5' 6"  (1.676 m)   Wt 200 lb (90.7 kg)   LMP 08/23/2006 (Approximate) Comment: tubal ligation  SpO2 97%   BMI 32.28 kg/m  Wt Readings from Last 3 Encounters:  12/05/20 200 lb (90.7 kg)  11/07/20 190 lb (86.2 kg)  11/03/20 197 lb 3.2 oz (89.4 kg)       Assessment & Plan:   Problem List Items Addressed This  Visit       Cardiovascular and Mediastinum   Essential hypertension    Patient advised to continue medication as prescribed and follow a low sodium diet. Pt advised of the following:  Start monitoring your blood pressure daily, around the same time of day, for the next 2-3 weeks.  Ensure that you have rested for 30 minutes prior to checking your blood pressure. Record your readings and bring them to your next visit.         Respiratory   Allergic rhinitis - Primary    Symptoms seem to be consistent with allergic rhinitis.  For  now we will start Flonase 1 spray once daily into both nostrils as well as Claritin (without Decongestant) once every morning.  If symptoms improve that is great if they do not pt advised to please follow-up with her primary Care provider.  We did order a CBC that is pending at current just to rule out any other etiologies.      Relevant Medications   loratadine (CLARITIN) 10 MG tablet   fluticasone (FLONASE) 50 MCG/ACT nasal spray   Other Relevant Orders   CBC with Differential/Platelet     Other   Traumatic hematoma of right hand    Appears to be healing hematoma.  May consider x-ray/ultrasound if no improvement.  Advised patient that this should improve in time please follow-up if no improvement and/or worsening in the next 1 to 2 weeks.       I have discontinued [name] "[name]"'s lubiprostone and MAGNESIUM CITRATE PO. I am also having her start on loratadine and fluticasone. Additionally, I am having her maintain her multivitamin, latanoprost, simethicone, lamoTRIgine, diazepam, Co-Enzyme Q-10, Melatonin, Bayer Microlet Lancets, amantadine, QUEtiapine, famotidine, acetaminophen, docusate sodium, Contour Next Monitor, loperamide, ondansetron, B-D UF III MINI PEN NEEDLES, Vraylar, gabapentin, Ascorbic Acid (VITAMIN C PO), B Complex Vitamins (VITAMIN B COMPLEX PO), zaleplon, nitroGLYCERIN, Calcium Carb-Cholecalciferol (CALTRATE 600+D3 PO), Probiotic Product (CULTURELLE PROBIOTICS PO), diphenhydrAMINE, polyethylene glycol, aspirin, atorvastatin, olmesartan, clopidogrel, Ozempic (1 MG/DOSE), ketoconazole, Tavaborole, dextroamphetamine, Levoxyl, Jardiance, valACYclovir, metoprolol succinate, benzonatate, ProAir RespiClick, Levemir FlexTouch, pantoprazole, ezetimibe, mesalamine, and Contour Next Test.  Meds ordered this encounter  Medications   loratadine (CLARITIN) 10 MG tablet    Sig: Take 1 tablet (10 mg total) by mouth daily.    Dispense:  90 tablet    Refill:  0    Order Specific  Question:   Supervising Provider    Answer:   BEDSOLE, AMY E [[id number]]   fluticasone (FLONASE) 50 MCG/ACT nasal spray    Sig: Place 1 spray into both nostrils daily.    Dispense:  16 g    Refill:  6    Order Specific Question:   Supervising Provider    Answer:   BEDSOLE, AMY E [[id number]]

## 2020-12-05 NOTE — Assessment & Plan Note (Addendum)
Symptoms seem to be consistent with allergic rhinitis.  For now we will start Flonase 1 spray once daily into both nostrils as well as Claritin (without Decongestant) once every morning.  If symptoms improve that is great if they do not pt advised to please follow-up with her primary Care provider.  We did order a CBC that is pending at current just to rule out any other etiologies.

## 2020-12-05 NOTE — Assessment & Plan Note (Signed)
Patient advised to continue medication as prescribed and follow a low sodium diet. Pt advised of the following:  Start monitoring your blood pressure daily, around the same time of day, for the next 2-3 weeks.  Ensure that you have rested for 30 minutes prior to checking your blood pressure. Record your readings and bring them to your next visit.

## 2020-12-05 NOTE — Assessment & Plan Note (Addendum)
Appears to be healing hematoma.  May consider x-ray/ultrasound if no improvement.  Advised patient that this should improve in time please follow-up if no improvement and/or worsening in the next 1 to 2 weeks.

## 2020-12-05 NOTE — Patient Instructions (Addendum)
Stop by the lab prior to leaving today. I will notify you of your results once received.    Start flonase 1 spray every morning in both sides of the nose daily and also start daily claritin (one tablet once in the am). Let's see if this helps with allergic symptoms.   Please f/u with pcp if no improvement in symptoms.   Please notify me in 3-4 days if you've had no improvement in your symptoms or if symptoms progress.   It was a pleasure seeing you today! Please do not hesitate to reach out with any questions and or concerns.  Regards,   Eugenia Pancoast

## 2020-12-13 DIAGNOSIS — G4700 Insomnia, unspecified: Secondary | ICD-10-CM | POA: Diagnosis not present

## 2020-12-13 DIAGNOSIS — F900 Attention-deficit hyperactivity disorder, predominantly inattentive type: Secondary | ICD-10-CM | POA: Diagnosis not present

## 2020-12-13 DIAGNOSIS — F3176 Bipolar disorder, in full remission, most recent episode depressed: Secondary | ICD-10-CM | POA: Diagnosis not present

## 2020-12-13 DIAGNOSIS — F3174 Bipolar disorder, in full remission, most recent episode manic: Secondary | ICD-10-CM | POA: Diagnosis not present

## 2020-12-14 ENCOUNTER — Encounter: Payer: Self-pay | Admitting: Family Medicine

## 2020-12-14 DIAGNOSIS — M25562 Pain in left knee: Secondary | ICD-10-CM

## 2020-12-14 DIAGNOSIS — G8929 Other chronic pain: Secondary | ICD-10-CM

## 2020-12-20 ENCOUNTER — Encounter: Payer: Self-pay | Admitting: Internal Medicine

## 2020-12-26 ENCOUNTER — Encounter: Payer: Self-pay | Admitting: Endocrinology

## 2020-12-26 DIAGNOSIS — M25562 Pain in left knee: Secondary | ICD-10-CM | POA: Insufficient documentation

## 2020-12-26 DIAGNOSIS — E1169 Type 2 diabetes mellitus with other specified complication: Secondary | ICD-10-CM

## 2020-12-26 NOTE — Addendum Note (Signed)
Addended by: [name] A on: 12/26/2020 01:27 PM   Modules accepted: Orders

## 2020-12-27 MED ORDER — EZETIMIBE 10 MG PO TABS: ORAL_TABLET | ORAL | 3 refills | Status: DC

## 2020-12-30 ENCOUNTER — Other Ambulatory Visit: Payer: Self-pay | Admitting: Internal Medicine

## 2021-01-01 ENCOUNTER — Encounter: Payer: Self-pay | Admitting: Family Medicine

## 2021-01-02 NOTE — Telephone Encounter (Signed)
Pharmacy updated.

## 2021-01-03 ENCOUNTER — Other Ambulatory Visit: Payer: Self-pay | Admitting: Internal Medicine

## 2021-01-03 DIAGNOSIS — M2242 Chondromalacia patellae, left knee: Secondary | ICD-10-CM | POA: Diagnosis not present

## 2021-01-04 ENCOUNTER — Encounter: Payer: Self-pay | Admitting: Family Medicine

## 2021-01-08 ENCOUNTER — Encounter: Payer: Self-pay | Admitting: Family Medicine

## 2021-01-09 ENCOUNTER — Telehealth: Payer: Self-pay | Admitting: Cardiovascular Disease

## 2021-01-09 DIAGNOSIS — R030 Elevated blood-pressure reading, without diagnosis of hypertension: Secondary | ICD-10-CM | POA: Diagnosis not present

## 2021-01-09 DIAGNOSIS — J40 Bronchitis, not specified as acute or chronic: Secondary | ICD-10-CM | POA: Diagnosis not present

## 2021-01-09 NOTE — Telephone Encounter (Signed)
Per mychart message :  Virtual visit  Have the flu, respiratory issues: persistent cough, wheezing. On prednisone Day 3 for acute arthritis in left knee. Flu started last Thursday. Sons both ill, tested positive for the flu at [facility].   Currently have no benzonate left, still have a little bit of albuterol inhaler. Feel like I need nebulizer treatments.  Please call to discuss

## 2021-01-09 NOTE — Telephone Encounter (Signed)
Please check in with her to see what is going on , thanks

## 2021-01-09 NOTE — Telephone Encounter (Signed)
Pt responded to our mychart saying she had a virtual visit yesterday and she doesn't need an appt from [place].

## 2021-01-16 DIAGNOSIS — J40 Bronchitis, not specified as acute or chronic: Secondary | ICD-10-CM | POA: Diagnosis not present

## 2021-01-17 DIAGNOSIS — G43019 Migraine without aura, intractable, without status migrainosus: Secondary | ICD-10-CM | POA: Diagnosis not present

## 2021-01-17 DIAGNOSIS — G43901 Migraine, unspecified, not intractable, with status migrainosus: Secondary | ICD-10-CM | POA: Diagnosis not present

## 2021-01-19 ENCOUNTER — Telehealth: Payer: Self-pay | Admitting: Endocrinology

## 2021-01-19 DIAGNOSIS — E1169 Type 2 diabetes mellitus with other specified complication: Secondary | ICD-10-CM

## 2021-01-19 MED ORDER — LEVOXYL 125 MCG PO TABS: 250.0000 ug | ORAL_TABLET | Freq: Every day | ORAL | 3 refills | Status: DC

## 2021-01-19 NOTE — Telephone Encounter (Signed)
MEDICATION: LEVOXYL 125 MCG tablet  PHARMACY:  [facility]  HAS THE PATIENT CONTACTED THEIR PHARMACY?  YES  IS THIS A 90 DAY SUPPLY : 30 DAYS  IS PATIENT OUT OF MEDICATION: NO  IF NOT; HOW MUCH IS LEFT: 2 DAYS  LAST APPOINTMENT DATE: @11 /16/2022  NEXT APPOINTMENT DATE:@2 /21/2023  DO WE HAVE YOUR PERMISSION TO LEAVE A DETAILED MESSAGE?:  OTHER COMMENTS: Patient request that the above pharmacy be used going forward for all medications.   **Let patient know to contact pharmacy at the end of the day to make sure medication is ready. **  ** Please notify patient to allow 48-72 hours to process**  **Encourage patient to contact the pharmacy for refills or they can request refills through [facility]**

## 2021-01-19 NOTE — Telephone Encounter (Signed)
Rx sent to preferred pharmacy.

## 2021-01-23 ENCOUNTER — Other Ambulatory Visit: Payer: Self-pay | Admitting: Cardiovascular Disease

## 2021-01-26 ENCOUNTER — Other Ambulatory Visit: Payer: Self-pay | Admitting: Endocrinology

## 2021-01-26 DIAGNOSIS — E1169 Type 2 diabetes mellitus with other specified complication: Secondary | ICD-10-CM

## 2021-02-01 ENCOUNTER — Encounter: Payer: Self-pay | Admitting: Internal Medicine

## 2021-02-01 ENCOUNTER — Other Ambulatory Visit: Payer: Self-pay

## 2021-02-01 ENCOUNTER — Encounter: Payer: Self-pay | Admitting: Family Medicine

## 2021-02-01 ENCOUNTER — Ambulatory Visit (INDEPENDENT_AMBULATORY_CARE_PROVIDER_SITE_OTHER)
Admission: RE | Admit: 2021-02-01 | Discharge: 2021-02-01 | Disposition: A | Discharge status: Home / Self Care | Payer: BC Managed Care – PPO | Source: Ambulatory Visit | Attending: Family Medicine | Admitting: Family Medicine

## 2021-02-01 ENCOUNTER — Ambulatory Visit: Payer: BC Managed Care – PPO | Admitting: Family Medicine

## 2021-02-01 VITALS — BP 110/70 | HR 81 | Temp 97.5°F | Ht 66.0 in | Wt 201.1 lb

## 2021-02-01 DIAGNOSIS — J9811 Atelectasis: Secondary | ICD-10-CM | POA: Diagnosis not present

## 2021-02-01 DIAGNOSIS — J069 Acute upper respiratory infection, unspecified: Secondary | ICD-10-CM

## 2021-02-01 DIAGNOSIS — J0101 Acute recurrent maxillary sinusitis: Secondary | ICD-10-CM | POA: Diagnosis not present

## 2021-02-01 DIAGNOSIS — L84 Corns and callosities: Secondary | ICD-10-CM | POA: Insufficient documentation

## 2021-02-01 LAB — POC COVID19 BINAXNOW: SARS Coronavirus 2 Ag: NEGATIVE

## 2021-02-01 MED ORDER — AMOXICILLIN-POT CLAVULANATE 875-125 MG PO TABS: 1.0000 | ORAL_TABLET | Freq: Two times a day (BID) | ORAL | 0 refills | Status: DC

## 2021-02-01 MED ORDER — BENZONATATE 200 MG PO CAPS: 200.0000 mg | ORAL_CAPSULE | Freq: Three times a day (TID) | ORAL | 1 refills | Status: DC | PRN

## 2021-02-01 NOTE — Assessment & Plan Note (Addendum)
Several uri in a row  (had influenza at [facility]) Purulent nasal drainage -suspect acute bacterial sinusitis  Some wheezing off of prednisone (if worsens will repeat course but would rather avoid) augmentin sent for pharmacy Enc fluids/rest  Refilled tessalon for cough mucinex DM Symptomatic care  CXR today given length of illness Neg rapid covid test  Update if not starting to improve in a week or if worsening  ER precautions adv  Meds ordered this encounter  Medications   amoxicillin-clavulanate (AUGMENTIN) 875-125 MG tablet    Sig: Take 1 tablet by mouth 2 (two) times daily.    Dispense:  14 tablet    Refill:  0   benzonatate (TESSALON) 200 MG capsule    Sig: Take 1 capsule (200 mg total) by mouth 3 (three) times daily as needed.    Dispense:  30 capsule    Refill:  1

## 2021-02-01 NOTE — Patient Instructions (Signed)
Fluids Rest  Take the augmentin for sinus infection  Flonase  Nasal saline spray   If wheezing worsens- call  Chest xray today  Tessalon for cough   Update if not starting to improve in a week or if worsening

## 2021-02-01 NOTE — Assessment & Plan Note (Signed)
L foot between 5,4th toes  From friction  No skin breakdown Recommend corn pad otc  inst to let us know if this does not help

## 2021-02-01 NOTE — Assessment & Plan Note (Signed)
S/p uri  Purulent nasal drainage and maxillary pain  augmentin sent to pharmacy  Encouraged saline irrigation and flonase Update if not starting to improve in a week or if worsening

## 2021-02-01 NOTE — Progress Notes (Signed)
Subjective:    Patient ID: Lindsay Fry, female    DOB: 12/11/1960, 61 y.o.   MRN: [id number]  This visit occurred during the SARS-CoV-2 public health emergency.  Safety protocols were in place, including screening questions prior to the visit, additional usage of staff PPE, and extensive cleaning of exam room while observing appropriate contact time as indicated for disinfecting solutions.   HPI Pt presents for c/o cough and nasal congestion   Wt Readings from Last 3 Encounters:  02/01/21 201 lb 2 oz (91.2 kg)  12/05/20 200 lb (90.7 kg)  11/07/20 190 lb (86.2 kg)   32.46 kg/m Also has spot on L 5th toe that hurts In between toes  Symptoms started Monday night  Was recently sick -she still wheezed a bit (after prednisone)  Worse at night   Cough is dry  Hears stuff rattling -does not come up  Nasal congestion-dark yellow and blood  Face hurts under eyes  Scratchy throat   Thought temp 2 nights ago (aches, burning feeling) Exhausted -sleeping a lot  No n/v/d   Has not cone a covid test at home   Blood sugar has been ok   Mucinex DM Tylenol arthritis  Afrin- limited use with caution  Albuterol flonase   Tessalon -needs refill  Results for orders placed or performed in visit on 02/01/21  POC COVID-19 BinaxNow  Result Value Ref Range   SARS Coronavirus 2 Ag Negative Negative   *Note: Due to a large number of results and/or encounters for the requested time period, some results have not been displayed. A complete set of results can be found in Results Review.     Patient Active Problem List   Diagnosis Date Noted   Callus between toes 02/01/2021   Left knee pain 12/26/2020   Traumatic hematoma of right hand 12/05/2020   Unstable angina ([place]) 04/06/2020   Cerebellar ataxia in diseases classified elsewhere ([place]) 03/28/2020   Elevated transaminase level 03/27/2020   Current use of proton pump inhibitor 03/19/2020   Hypokalemia 03/07/2020   History of  COVID-19 03/07/2020   Aortic atherosclerosis ([place]) 03/07/2020   Double vision 12/14/2019   MCI (mild cognitive impairment) 08/05/2019   Obesity (BMI 30-39.9) 04/23/2019   Dizziness 12/16/2018   Poor balance 12/16/2018   Fatigue 12/16/2018   Migraine 11/12/2018   Uncontrolled type 2 diabetes mellitus with hyperglycemia, with long-term current use of insulin ([place]) 02/19/2018   Pleural thickening 11/05/2017   Mouth ulcers 11/05/2017   Cervical radiculopathy due to degenerative joint disease of spine 10/28/2017   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   Supraventricular tachycardia ([place]) 04/22/2017   Osteopenia 09/25/2016   Estrogen deficiency 05/30/2016   Need for hepatitis C screening test 03/19/2016   Carcinoma of overlapping sites of left breast in female, estrogen receptor positive ([place]) 08/21/2015   Chronic constipation 01/18/2015   NSTEMI (non-ST elevated myocardial infarction) ([place]) 09/06/2014   Coronary artery disease involving native coronary artery of native heart with angina pectoris ([place]) 09/06/2014   DM (diabetes mellitus) ([place]) 09/05/2014   History of breast cancer 06/02/2014   Sinus tachycardia 04/27/2014   ADD (attention deficit disorder) 11/19/2012   Shortness of breath 09/02/2012   Low back pain 08/05/2012   Hypothyroid 05/18/2012   Chronic sinusitis 01/24/2012   Vertigo, benign positional 01/24/2012   URI (upper respiratory infection) 04/08/2011   Other screening mammogram 12/05/2010   Routine general medical examination at a health care facility 10/14/2010   POSTMENOPAUSAL STATUS 06/22/2008  Vitamin D deficiency 03/18/2008   BENIGN NEOPLASM OTH&UNSPEC SITE DIGESTIVE SYSTEM 01/09/2007   HIATAL HERNIA 01/09/2007   COLONIC POLYPS, ADENOMATOUS, HX OF 01/09/2007   Hyperlipidemia associated with type 2 diabetes mellitus (HCC) 01/07/2007   Bipolar I disorder, most recent episode depressed (HCC) 01/07/2007   Essential hypertension 01/07/2007   Allergic rhinitis 01/07/2007    ASTHMA 01/07/2007   GERD 01/07/2007   Ulcerative colitis (HCC) 01/07/2007   ACNE ROSACEA 01/07/2007   MIGRAINES, HX OF 01/07/2007   Fibromyalgia 10/30/2006   INSOMNIA 10/30/2006   Past Medical History:  Diagnosis Date   ADD (attention deficit disorder) 11/19/2012   Allergic rhinitis, cause unspecified    Anxiety state, unspecified    Arthritis    Asymptomatic postmenopausal status (age-related) (natural)    Atypical hyperplasia of left breast 2000   Atypical mole 03/16/2018   R paraspinal mid upper back, excision   Atypical mole 02/17/2018   R spinal upper back, moderate   Atypical mole 08/11/2017   L sternum, excision   Atypical mole 05/27/2017   L post neck, moderate   Benign neoplasm of other and unspecified site of the digestive system    Bipolar disorder (HCC)    Breast cancer (HCC)    Left Breast 83mm invasive CA left uiq, dcis left uoq and a lot ADH, ALH in both breasts.   CAD (coronary artery disease)    a. 08/2014 NSTEMI/Cath: LM nl, LAD 50p, D1/D2 min irregs, LCX nl, OM1 40, LPDA nl, RI 99ost (2.70mm vessel->med Rx), RCA nl, AM 60, nl EF; b. 11/2016 MV: EF 74%, no ischemia (performed 2/2 dyspnea & new inf TWI).   Carcinoma of left breast (HCC) 06/02/2014   Cataract    Chronic diastolic CHF (congestive heart failure) (HCC)    a. 08/2014 Echo: EF 60-65%, Gr 1 DD, nl LA.   Clotting disorder (HCC)    on blood thiner, Plavix   Cyst    on Achilles tendon   Depression    Diabetes mellitus (HCC)    frank   Dysuria    Edema    Family history of malignant neoplasm of gastrointestinal tract    Fibromyalgia    GERD (gastroesophageal reflux disease)    Glaucoma    pre glaucoma, 05/14/2018 pt. denies glaucoma   Headache    migraines   Hiatal hernia    History of alcoholism (HCC)    History of migraines    History of ovarian cyst    Hx of adenomatous colonic polyps    Insomnia, unspecified    Myocardial infarction (HCC) 09-04-14   OCD (obsessive compulsive disorder)     Osteopenia 09/25/2016   Femoral neck T -1.1  9/18   Other screening mammogram    Pure hypercholesterolemia    Rosacea    Subacute confusional state 11/19/2012   Tubular adenoma of colon 01/10/11   Ulcerative colitis, unspecified    Unspecified asthma(493.90)    Unspecified essential hypertension    Unspecified hypothyroidism    Unspecified vitamin D deficiency    Vertigo    Past Surgical History:  Procedure Laterality Date   APPENDECTOMY     AXILLARY SENTINEL NODE BIOPSY Left 05/23/2014   Procedure: AXILLARY SENTINEL NODE BIOPSY;  Surgeon: Kieth Brightly, MD;  Location: [facility] ORS;  Service: General;  Laterality: Left;   BREAST BIOPSY  08/1998   on tamoxifen, atypical hyperplasia   BREAST BIOPSY Left 04/2014   BREAST SURGERY Left 08/1998   lumpectomy/ Dr Okey Dupre  BREAST SURGERY Bilateral 05/23/2014   Mastectomy   BUNIONECTOMY Right    CARDIAC CATHETERIZATION N/A 09/05/2014   Procedure: Left Heart Cath and Coronary Angiography;  Surgeon: Iran Ouch, MD;  Location: [facility];  Service: Cardiovascular;  Laterality: N/A;   CARDIAC CATHETERIZATION  09/05/2014   CATARACT EXTRACTION W/ INTRAOCULAR LENS IMPLANT Bilateral    CESAREAN SECTION  1996/1997   placenta previa, gest DM, pre-eclampsia   CHOLECYSTECTOMY  1997   adhesions also   COLONOSCOPY  01/2001   Ulcerative colitis   COLONOSCOPY  04/2004   UC, polyp   COLONOSCOPY  12/2006   UC, no polyps   CORONARY STENT INTERVENTION N/A 04/06/2020   Procedure: CORONARY STENT INTERVENTION;  Surgeon: Yvonne Kendall, MD;  Location: [facility];  Service: Cardiovascular;  Laterality: N/A;   DEXA  04/1999 and 2010   normal   ESOPHAGOGASTRODUODENOSCOPY  09/2001   polyp   exercise stress test  11/2003   negative   FEMUR FRACTURE SURGERY Left    LEFT HEART CATH AND CORONARY ANGIOGRAPHY N/A 04/06/2020   Procedure: LEFT HEART CATH AND CORONARY ANGIOGRAPHY;  Surgeon: Antonieta Iba, MD;  Location: [facility]  INVASIVE CV LAB;  Service: Cardiovascular;  Laterality: N/A;   MASTECTOMY Bilateral    MECKEL DIVERTICULUM EXCISION  1999   NASAL SINUS SURGERY  05/1997   nuclear stress test  06/2007   negative   precancerous mole removed     RIGHT AND LEFT HEART CATH     with 1 stent placed in March 2022   SIMPLE MASTECTOMY WITH AXILLARY SENTINEL NODE BIOPSY Bilateral 05/23/2014   Procedure: Bilateral simple mastectomy, left sentinel node biopsy ;  Surgeon: Kieth Brightly, MD;  Location: [facility];  Service: General;  Laterality: Bilateral;   Sleep study  11/2007   no apnea, but did snore (done by [facility])   TONSILLECTOMY  1984   TUBAL LIGATION     WISDOM TOOTH EXTRACTION  1980's   Social History   Tobacco Use   Smoking status: Former    Packs/day: 0.25    Years: 5.00    Pack years: 1.25    Types: Cigarettes    Quit date: 01/14/1993    Years since quitting: 28.0   Smokeless tobacco: Never  Vaping Use   Vaping Use: Never used  Substance Use Topics   Alcohol use: No    Alcohol/week: 0.0 standard drinks    Comment: Recovered ETOH   Drug use: No   Family History  Problem Relation Age of Onset   Coronary artery disease Father    Colon cancer Father    Alzheimer's disease Father    Dementia Father    Colon polyps Father    Coronary artery disease Mother    Hypertension Mother    Osteoporosis Mother    Crohn's disease Mother        crohns colitis   Breast cancer Other        great aunts   Coronary artery disease Other        Uncle (also AAA)   Diabetes Other        remote family history   Stroke Cousin    Esophageal cancer Neg Hx    Rectal cancer Neg Hx    Stomach cancer Neg Hx    Allergies  Allergen Reactions   Ephedrine Other (See Comments)    Pt becomes hyper   Aspirin     REACTION: aggrivates colitis  Crestor [Rosuvastatin]     REACTION: increased lfts   Erythromycin     REACTION: GI upset   Nsaids     REACTION: aggrivate colitis   Oxycodone Other (See  Comments)    Panic Attack   Current Outpatient Medications on File Prior to Visit  Medication Sig Dispense Refill   acetaminophen (TYLENOL) 650 MG CR tablet Take 1,300 mg by mouth 2 (two) times daily.     Albuterol Sulfate (PROAIR RESPICLICK) 591 (90 Base) MCG/ACT AEPB Inhale 1 puff into the lungs every 4 (four) hours as needed (wheezing). 1 each 1   amantadine (SYMMETREL) 100 MG capsule Take 100 mg by mouth 2 (two) times daily.     Ascorbic Acid (VITAMIN C PO) Take 1,000 mg by mouth daily.     aspirin 81 MG chewable tablet Chew 1 tablet (81 mg total) by mouth daily. 90 tablet 3   atorvastatin (LIPITOR) 80 MG tablet TAKE 1 TABLET BY MOUTH ONCE EVERY EVENING 90 tablet 1   B Complex Vitamins (VITAMIN B COMPLEX PO) Take 1 tablet by mouth every other day.     BAYER MICROLET LANCETS lancets USE AS DIRECTED TO CHECK BLOOD SUGAR TWICE DAILY 200 each 2   benzonatate (TESSALON PERLES) 100 MG capsule Take 1 capsule (100 mg total) by mouth 3 (three) times daily as needed. 20 capsule 0   Blood Glucose Monitoring Suppl (CONTOUR NEXT MONITOR) w/Device KIT 1 each by Does not apply route in the morning and at bedtime. Use Contour Next meter to check blood sugar twice daily. 1 kit 0   Calcium Carb-Cholecalciferol (CALTRATE 600+D3 PO) Take 1 tablet by mouth daily. 300 mg Vitamin D3     clopidogrel (PLAVIX) 75 MG tablet Take 1 tablet (75 mg total) by mouth daily. 90 tablet 3   Co-Enzyme Q-10 100 MG CAPS Take 100 mg by mouth daily.     dextroamphetamine (DEXEDRINE SPANSULE) 15 MG 24 hr capsule Take 15 mg by mouth as needed.     diazepam (VALIUM) 10 MG tablet Take 5-10 mg by mouth daily as needed for anxiety.     diphenhydrAMINE (BENADRYL) 25 MG tablet Take 25-50 mg by mouth at bedtime.     docusate sodium (COLACE) 100 MG capsule Take 100-300 mg by mouth daily as needed for mild constipation or moderate constipation.     ezetimibe (ZETIA) 10 MG tablet TAKE 1 TABLET(10 MG) BY MOUTH DAILY 90 tablet 3   famotidine  (PEPCID) 40 MG tablet Take 40 mg by mouth 2 (two) times daily. 30 min. Before meal and at bedtime     fluticasone (FLONASE) 50 MCG/ACT nasal spray Place 1 spray into both nostrils daily. 16 g 6   gabapentin (NEURONTIN) 300 MG capsule TAKE 1 CAPSULE(300 MG) BY MOUTH THREE TIMES DAILY 90 capsule 11   glucose blood (CONTOUR NEXT TEST) test strip USE AS DIRECTED 100 strip 2   insulin detemir (LEVEMIR FLEXTOUCH) 100 UNIT/ML FlexPen Inject 60 Units into the skin every evening. 15 mL 5   Insulin Pen Needle (B-D UF III MINI PEN NEEDLES) 31G X 5 MM MISC USE FOR INJECTIONS TWICE DAILY 100 each 12   JARDIANCE 25 MG TABS tablet TAKE 1 TABLET(25 MG) BY MOUTH DAILY 30 tablet 3   ketoconazole (NIZORAL) 2 % cream Apply to affected areas one to two times daily as needed. 30 g 2   lamoTRIgine (LAMICTAL) 200 MG tablet Take 400 mg by mouth daily.     latanoprost (XALATAN) 0.005 %  ophthalmic solution Place 1 drop into both eyes 2 (two) times a week.      LEVOXYL 125 MCG tablet Take 2 tablets (250 mcg total) by mouth daily before breakfast. 60 tablet 3   loperamide (IMODIUM A-D) 2 MG tablet Take 2 mg by mouth daily as needed for diarrhea or loose stools.     Melatonin 10 MG CAPS Take 10 mg by mouth at bedtime.     mesalamine (LIALDA) 1.2 g EC tablet TAKE 2 TABLETS BY MOUTH DAILY 180 tablet 1   metoprolol succinate (TOPROL-XL) 25 MG 24 hr tablet TAKE ONE-HALF TABLET BY MOUTH AS NEEDED AT BEDTIME FOR SYSTOLIC BLOOD PRESSURE GREATER THAN 100 45 tablet 3   Multiple Vitamin (MULTIVITAMIN) tablet Take 1 tablet by mouth daily. Woman 50 plus     nitroGLYCERIN (NITROSTAT) 0.4 MG SL tablet Place 1 tablet (0.4 mg total) under the tongue every 5 (five) minutes as needed for chest pain. 25 tablet 4   olmesartan (BENICAR) 5 MG tablet Take 1 tablet (5 mg total) by mouth daily. 90 tablet 3   ondansetron (ZOFRAN-ODT) 4 MG disintegrating tablet DISSOLVE 1 TABLET(4 MG) ON THE TONGUE EVERY 6 HOURS AS NEEDED FOR NAUSEA OR VOMITING 90 tablet  1   pantoprazole (PROTONIX) 40 MG tablet TAKE 1 TABLET(40 MG) BY MOUTH TWICE DAILY 180 tablet 1   polyethylene glycol (MIRALAX / GLYCOLAX) 17 g packet Take 17 g by mouth 3 (three) times daily as needed for moderate constipation, mild constipation or severe constipation. In water or beverage     Probiotic Product (CULTURELLE PROBIOTICS PO) Take 1 capsule by mouth daily. For woman     QUEtiapine (SEROQUEL XR) 400 MG 24 hr tablet Take 800 mg by mouth at bedtime.     Semaglutide, 1 MG/DOSE, (OZEMPIC, 1 MG/DOSE,) 4 MG/3ML SOPN Inject 1 mg as directed every Monday. 3 mL 5   simethicone (MYLICON) 80 MG chewable tablet Chew 80 mg by mouth 4 (four) times daily.     Tavaborole (KERYDIN) 5 % SOLN Apply 1 application topically at bedtime. Apply to affected area of left great toenail 10 mL 6   valACYclovir (VALTREX) 1000 MG tablet TAKE 1 TABLET(1000 MG) BY MOUTH DAILY 30 tablet 5   VRAYLAR capsule Take 3 mg by mouth at bedtime.     zaleplon (SONATA) 10 MG capsule Take 10-20 mg by mouth at bedtime.     No current facility-administered medications on file prior to visit.    Review of Systems  Constitutional:  Negative for activity change, appetite change, fatigue, fever and unexpected weight change.  HENT:  Positive for congestion, postnasal drip, rhinorrhea, sinus pressure, sinus pain, sneezing and sore throat. Negative for ear pain and facial swelling.   Eyes:  Negative for pain, redness and visual disturbance.  Respiratory:  Positive for cough and wheezing. Negative for shortness of breath.   Cardiovascular:  Negative for chest pain and palpitations.  Gastrointestinal:  Negative for abdominal pain, blood in stool, constipation and diarrhea.  Endocrine: Negative for polydipsia and polyuria.  Genitourinary:  Negative for dysuria, frequency and urgency.  Musculoskeletal:  Negative for arthralgias, back pain and myalgias.  Skin:  Negative for pallor and rash.  Allergic/Immunologic: Negative for  environmental allergies.  Neurological:  Negative for dizziness, syncope and headaches.  Hematological:  Negative for adenopathy. Does not bruise/bleed easily.  Psychiatric/Behavioral:  Negative for decreased concentration and dysphoric mood. The patient is not nervous/anxious.       Objective:   Physical  Exam Constitutional:      General: She is not in acute distress.    Appearance: Normal appearance. She is well-developed. She is obese. She is not ill-appearing.  HENT:     Head: Normocephalic and atraumatic.     Comments: Nares are injected and congested  Clear pnd   Bilateral  mild maxillary sinus tenderness     Right Ear: Tympanic membrane and external ear normal.     Left Ear: Tympanic membrane, ear canal and external ear normal.     Nose: Congestion and rhinorrhea present.     Mouth/Throat:     Mouth: Mucous membranes are moist.     Pharynx: No oropharyngeal exudate or posterior oropharyngeal erythema.     Comments: Clear pnd Eyes:     General:        Right eye: No discharge.        Left eye: No discharge.     Conjunctiva/sclera: Conjunctivae normal.     Pupils: Pupils are equal, round, and reactive to light.  Cardiovascular:     Rate and Rhythm: Normal rate and regular rhythm.  Pulmonary:     Effort: Pulmonary effort is normal. No respiratory distress.     Breath sounds: Normal breath sounds. No stridor. No wheezing, rhonchi or rales.     Comments: Wheeze only on forced expiration  Upper airway sounds noted  No rales Few scattered rhonchi Musculoskeletal:     Cervical back: Normal range of motion and neck supple.  Lymphadenopathy:     Cervical: No cervical adenopathy.  Skin:    General: Skin is warm and dry.     Findings: No rash.     Comments: Small callus on L 5th toe where is meets the 4th toe No skin breakdown  Neurological:     Mental Status: She is alert.     Cranial Nerves: No cranial nerve deficit.  Psychiatric:        Mood and Affect: Mood normal.           Assessment & Plan:   Problem List Items Addressed This Visit       Respiratory   URI (upper respiratory infection) - Primary    Several uri in a row  (had influenza at [facility]) Purulent nasal drainage -suspect acute bacterial sinusitis  Some wheezing off of prednisone (if worsens will repeat course but would rather avoid) augmentin sent for pharmacy Enc fluids/rest  Refilled tessalon for cough mucinex DM Symptomatic care  CXR today given length of illness Neg rapid covid test  Update if not starting to improve in a week or if worsening  ER precautions adv  Meds ordered this encounter  Medications   amoxicillin-clavulanate (AUGMENTIN) 875-125 MG tablet    Sig: Take 1 tablet by mouth 2 (two) times daily.    Dispense:  14 tablet    Refill:  0   benzonatate (TESSALON) 200 MG capsule    Sig: Take 1 capsule (200 mg total) by mouth 3 (three) times daily as needed.    Dispense:  30 capsule    Refill:  1          Relevant Orders   POC COVID-19 BinaxNow (Completed)   DG Chest 2 View (Completed)   Acute sinusitis    S/p uri  Purulent nasal drainage and maxillary pain  augmentin sent to pharmacy  Encouraged saline irrigation and flonase Update if not starting to improve in a week or if worsening  Relevant Medications   amoxicillin-clavulanate (AUGMENTIN) 875-125 MG tablet   benzonatate (TESSALON) 200 MG capsule     Musculoskeletal and Integument   Callus between toes    L foot between 5,4th toes  From friction  No skin breakdown Recommend corn pad otc  inst to let us know if this does not help

## 2021-02-02 ENCOUNTER — Telehealth: Payer: Self-pay | Admitting: Internal Medicine

## 2021-02-02 NOTE — Telephone Encounter (Signed)
Called patient to schedule OV with Dr. Henrene Pastor left voicemail.

## 2021-02-12 ENCOUNTER — Other Ambulatory Visit: Payer: Self-pay

## 2021-02-12 ENCOUNTER — Other Ambulatory Visit (INDEPENDENT_AMBULATORY_CARE_PROVIDER_SITE_OTHER): Payer: BC Managed Care – PPO

## 2021-02-12 DIAGNOSIS — E890 Postprocedural hypothyroidism: Secondary | ICD-10-CM | POA: Diagnosis not present

## 2021-02-12 DIAGNOSIS — E7800 Pure hypercholesterolemia, unspecified: Secondary | ICD-10-CM

## 2021-02-12 DIAGNOSIS — E669 Obesity, unspecified: Secondary | ICD-10-CM | POA: Diagnosis not present

## 2021-02-12 DIAGNOSIS — E1169 Type 2 diabetes mellitus with other specified complication: Secondary | ICD-10-CM

## 2021-02-12 LAB — LIPID PANEL
Cholesterol: 166 mg/dL (ref 0–200)
HDL: 50.7 mg/dL (ref >39.00)
LDL Cholesterol: 77 mg/dL (ref 0–99)
NonHDL: 115.34
Total CHOL/HDL Ratio: 3
Triglycerides: 194 mg/dL — ABNORMAL HIGH (ref 0.0–149.0)
VLDL: 38.8 mg/dL (ref 0.0–40.0)

## 2021-02-12 LAB — COMPREHENSIVE METABOLIC PANEL
ALT: 27 U/L (ref 0–35)
AST: 24 U/L (ref 0–37)
Albumin: 4.4 g/dL (ref 3.5–5.2)
Alkaline Phosphatase: 79 U/L (ref 39–117)
BUN: 20 mg/dL (ref 6–23)
CO2: 25 mEq/L (ref 19–32)
Calcium: 9.7 mg/dL (ref 8.4–10.5)
Chloride: 104 mEq/L (ref 96–112)
Creatinine, Ser: 0.91 mg/dL (ref 0.40–1.20)
GFR: 67.59 mL/min (ref >60.00)
Glucose, Bld: 113 mg/dL — ABNORMAL HIGH (ref 70–99)
Potassium: 4.6 mEq/L (ref 3.5–5.1)
Sodium: 140 mEq/L (ref 135–145)
Total Bilirubin: 0.4 mg/dL (ref 0.2–1.2)
Total Protein: 6.4 g/dL (ref 6.0–8.3)

## 2021-02-12 LAB — TSH: TSH: 6.93 u[IU]/mL — ABNORMAL HIGH (ref 0.35–5.50)

## 2021-02-12 LAB — HEMOGLOBIN A1C: Hgb A1c MFr Bld: 6.2 % (ref 4.6–6.5)

## 2021-02-12 LAB — T4, FREE: Free T4: 0.73 ng/dL (ref 0.60–1.60)

## 2021-02-15 ENCOUNTER — Encounter: Payer: Self-pay | Admitting: Internal Medicine

## 2021-02-15 ENCOUNTER — Encounter: Payer: Self-pay | Admitting: Endocrinology

## 2021-02-15 ENCOUNTER — Encounter: Payer: Self-pay | Admitting: Cardiovascular Disease

## 2021-02-19 ENCOUNTER — Encounter: Payer: Self-pay | Admitting: Family Medicine

## 2021-02-20 DIAGNOSIS — F411 Generalized anxiety disorder: Secondary | ICD-10-CM | POA: Diagnosis not present

## 2021-02-20 DIAGNOSIS — F3112 Bipolar disorder, current episode manic without psychotic features, moderate: Secondary | ICD-10-CM | POA: Diagnosis not present

## 2021-02-20 NOTE — Telephone Encounter (Signed)
Left VM letting pt know she needs an appt, also sent mychart message letting pt know she needs to schedule appt

## 2021-02-22 ENCOUNTER — Ambulatory Visit: Payer: BC Managed Care – PPO | Admitting: Family

## 2021-02-22 ENCOUNTER — Encounter: Payer: Self-pay | Admitting: Family

## 2021-02-22 ENCOUNTER — Other Ambulatory Visit: Payer: Self-pay

## 2021-02-22 ENCOUNTER — Other Ambulatory Visit: Payer: Self-pay | Admitting: Family

## 2021-02-22 VITALS — BP 136/84 | HR 106 | Temp 97.7°F | Ht 66.0 in | Wt 208.1 lb

## 2021-02-22 DIAGNOSIS — R300 Dysuria: Secondary | ICD-10-CM | POA: Insufficient documentation

## 2021-02-22 DIAGNOSIS — N3000 Acute cystitis without hematuria: Secondary | ICD-10-CM | POA: Insufficient documentation

## 2021-02-22 LAB — POC URINALSYSI DIPSTICK (AUTOMATED)
Bilirubin, UA: NEGATIVE
Blood, UA: NEGATIVE
Glucose, UA: POSITIVE — AB
Ketones, UA: NEGATIVE
Leukocytes, UA: NEGATIVE
Nitrite, UA: NEGATIVE
Protein, UA: NEGATIVE
Spec Grav, UA: 1.01 (ref 1.010–1.025)
Urobilinogen, UA: 0.2 E.U./dL
pH, UA: 6 (ref 5.0–8.0)

## 2021-02-22 MED ORDER — NITROFURANTOIN MONOHYD MACRO 100 MG PO CAPS: 100.0000 mg | ORAL_CAPSULE | Freq: Two times a day (BID) | ORAL | 0 refills | Status: AC

## 2021-02-22 NOTE — Patient Instructions (Signed)
It was a pleasure seeing you today.   I am suspecting a UTI, I will send antibiotics pending the results of your culture.   We are sending your urine for a culture to make sure you do not have a resistant bacteria. We will call you if we need to change your medications.   Please make sure you are drinking plenty of fluids over the next few days.  If your symptoms do not improve over the next 5-7 days, or if they worsen, please let us know. Please also let us know if you have worsening back pain, fevers, chills, or body aches.   Regards,   Eugenia Pancoast

## 2021-02-22 NOTE — Addendum Note (Signed)
Addended by: Tammi Sou on: 02/22/2021 05:15 PM   Modules accepted: Orders

## 2021-02-22 NOTE — Assessment & Plan Note (Signed)
Suspected UTI due to sx, will treat pending results of urine culture and urinalysis. Increase oral fluid intake of water throughout the day. Can not rely on dipstick as pt currently taking azo. If fever/chills let me know.

## 2021-02-22 NOTE — Progress Notes (Signed)
Established Patient Office Visit  Subjective:  Patient ID: Lindsay Fry, female    DOB: 1960-05-31  Age: 61 y.o. MRN: [id number]  CC:  Chief Complaint  Patient presents with   Dysuria    C/o pain/burning with urination and back pain.  Sxs started 02/19/21. Tied AZO, helpful.     HPI Lindsay Fry is here today with concerns.   Four days ago started with dysuria and back pain but she states this is more related to her fibromyalgia and she has chronic back pain. Does have increased urinary frequency and also urgency. No flank pain. No fever/chills.   No vaginal discharge.   Azo is really helping her with this.   Past Medical History:  Diagnosis Date   ADD (attention deficit disorder) 11/19/2012   Allergic rhinitis, cause unspecified    Anxiety state, unspecified    Arthritis    Asymptomatic postmenopausal status (age-related) (natural)    Atypical hyperplasia of left breast 2000   Atypical mole 03/16/2018   R paraspinal mid upper back, excision   Atypical mole 02/17/2018   R spinal upper back, moderate   Atypical mole 08/11/2017   L sternum, excision   Atypical mole 05/27/2017   L post neck, moderate   Benign neoplasm of other and unspecified site of the digestive system    Bipolar disorder ([place])    Breast cancer ([place])    Left Breast 64m invasive CA left uiq, dcis left uoq and a lot ADH, ALH in both breasts.   CAD (coronary artery disease)    a. 08/2014 NSTEMI/Cath: LM nl, LAD 50p, D1/D2 min irregs, LCX nl, OM1 40, LPDA nl, RI 99ost (2.044mvessel->med Rx), RCA nl, AM 60, nl EF; b. 11/2016 MV: EF 74%, no ischemia (performed 2/2 dyspnea & new inf TWI).   Carcinoma of left breast ([facility]/19/2016   Cataract    Chronic diastolic CHF (congestive heart failure) ([facility]   a. 08/2014 Echo: EF 60-65%, Gr 1 DD, nl LA.   Clotting disorder ([facility] [place]   on blood thiner, Plavix   Cyst    on Achilles tendon   Depression    Diabetes mellitus ([facility]   frank   Dysuria    Edema     Family history of malignant neoplasm of gastrointestinal tract    Fibromyalgia    GERD (gastroesophageal reflux disease)    Glaucoma    pre glaucoma, 05/14/2018 pt. denies glaucoma   Headache    migraines   Hiatal hernia    History of alcoholism ([facility]   History of migraines    History of ovarian cyst    Hx of adenomatous colonic polyps    Insomnia, unspecified    Myocardial infarction ([facility]16   OCD (obsessive compulsive disorder)    Osteopenia 09/25/2016   Femoral neck T -1.1  9/18   Other screening mammogram    Pure hypercholesterolemia    Rosacea    Subacute confusional state 11/19/2012   Tubular adenoma of colon 01/10/11   Ulcerative colitis, unspecified    Unspecified asthma(493.90)    Unspecified essential hypertension    Unspecified hypothyroidism    Unspecified vitamin D deficiency    Vertigo     Past Surgical History:  Procedure Laterality Date   APPENDECTOMY     AXILLARY SENTINEL NODE BIOPSY Left 05/23/2014   Procedure: AXILLARY SENTINEL NODE BIOPSY;  Surgeon: SeChristene LyeMD;  Location: [facility];  Service: General;  Laterality: Left;  BREAST BIOPSY  08/1998   on tamoxifen, atypical hyperplasia   BREAST BIOPSY Left 04/2014   BREAST SURGERY Left 08/1998   lumpectomy/ Dr Sharlet Salina   BREAST SURGERY Bilateral 05/23/2014   Mastectomy   BUNIONECTOMY Right    CARDIAC CATHETERIZATION N/A 09/05/2014   Procedure: Left Heart Cath and Coronary Angiography;  Surgeon: Wellington Hampshire, MD;  Location: [place] [facility];  Service: Cardiovascular;  Laterality: N/A;   CARDIAC CATHETERIZATION  09/05/2014   CATARACT EXTRACTION W/ INTRAOCULAR LENS IMPLANT Bilateral    CESAREAN SECTION  1996/1997   placenta previa, gest DM, pre-eclampsia   CHOLECYSTECTOMY  1997   adhesions also   COLONOSCOPY  01/2001   Ulcerative colitis   COLONOSCOPY  04/2004   UC, polyp   COLONOSCOPY  12/2006   UC, no polyps   CORONARY STENT INTERVENTION N/A 04/06/2020   Procedure:  CORONARY STENT INTERVENTION;  Surgeon: Nelva Bush, MD;  Location: [place] [facility];  Service: Cardiovascular;  Laterality: N/A;   DEXA  04/1999 and 2010   normal   ESOPHAGOGASTRODUODENOSCOPY  09/2001   polyp   exercise stress test  11/2003   negative   FEMUR FRACTURE SURGERY Left    LEFT HEART CATH AND CORONARY ANGIOGRAPHY N/A 04/06/2020   Procedure: LEFT HEART CATH AND CORONARY ANGIOGRAPHY;  Surgeon: Minna Merritts, MD;  Location: [place] [facility];  Service: Cardiovascular;  Laterality: N/A;   MASTECTOMY Bilateral    MECKEL DIVERTICULUM EXCISION  1999   NASAL SINUS SURGERY  05/1997   nuclear stress test  06/2007   negative   precancerous mole removed     RIGHT AND LEFT HEART CATH     with 1 stent placed in March 2022   SIMPLE MASTECTOMY WITH AXILLARY SENTINEL NODE BIOPSY Bilateral 05/23/2014   Procedure: Bilateral simple mastectomy, left sentinel node biopsy ;  Surgeon: Christene Lye, MD;  Location: [facility];  Service: General;  Laterality: Bilateral;   Sleep study  11/2007   no apnea, but did snore (done by [facility])   Gray EXTRACTION  82's    Family History  Problem Relation Age of Onset   Coronary artery disease Father    Colon cancer Father    Alzheimer's disease Father    Dementia Father    Colon polyps Father    Coronary artery disease Mother    Hypertension Mother    Osteoporosis Mother    Crohn's disease Mother        crohns colitis   Breast cancer Other        great aunts   Coronary artery disease Other        Uncle (also AAA)   Diabetes Other        remote family history   Stroke Cousin    Esophageal cancer Neg Hx    Rectal cancer Neg Hx    Stomach cancer Neg Hx     Social History   Socioeconomic History   Marital status: Married    Spouse name: alan   Number of children: 2   Years of education: college   Highest education level: Not on file  Occupational History    Occupation: unemployed  Tobacco Use   Smoking status: Former    Packs/day: 0.25    Years: 5.00    Pack years: 1.25    Types: Cigarettes    Quit date: 01/14/1993    Years since  quitting: 28.1   Smokeless tobacco: Never  Vaping Use   Vaping Use: Never used  Substance and Sexual Activity   Alcohol use: No    Alcohol/week: 0.0 standard drinks    Comment: Recovered ETOH   Drug use: No   Sexual activity: Not Currently    Partners: Male  Other Topics Concern   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     amantadine (SYMMETREL) 100 MG capsule Take 100 mg by mouth 2 (two) times daily.     Ascorbic Acid (VITAMIN C PO) Take 1,000 mg by mouth daily.     aspirin 81 MG chewable tablet Chew 1 tablet (81 mg total) by mouth daily. 90 tablet 3   atorvastatin (LIPITOR) 80 MG tablet TAKE 1 TABLET BY MOUTH ONCE EVERY EVENING 90 tablet 1   B Complex Vitamins (VITAMIN B COMPLEX PO) Take 1 tablet by mouth every other day.     BAYER MICROLET LANCETS lancets USE AS DIRECTED TO CHECK BLOOD SUGAR TWICE DAILY 200 each 2   Blood Glucose Monitoring Suppl (CONTOUR NEXT MONITOR) w/Device KIT 1 each by Does not apply route in the morning and at bedtime. Use Contour Next meter to check blood sugar twice daily. 1 kit 0   Calcium Carb-Cholecalciferol (CALTRATE 600+D3 PO) Take 1 tablet by mouth daily. 300 mg Vitamin D3     clopidogrel (PLAVIX) 75 MG tablet Take 1  tablet (75 mg total) by mouth daily. 90 tablet 3   Co-Enzyme Q-10 100 MG CAPS Take 100 mg by mouth daily.     dextroamphetamine (DEXEDRINE SPANSULE) 15 MG 24 hr capsule Take 15 mg by mouth as needed.     diazepam (VALIUM) 10 MG tablet Take 5-10 mg by mouth daily as needed for anxiety.     diphenhydrAMINE (BENADRYL) 25 MG tablet Take 50 mg by mouth at bedtime.     docusate sodium (COLACE) 100 MG capsule Take 100-300 mg by mouth daily as needed for mild constipation or moderate constipation.     ezetimibe (ZETIA) 10 MG tablet TAKE 1 TABLET(10 MG) BY MOUTH DAILY 90 tablet 3   famotidine (PEPCID) 40 MG tablet Take 40 mg by mouth 2 (two) times daily. 30 min. Before meal and at bedtime     fluticasone (FLONASE) 50 MCG/ACT nasal spray Place 1 spray into both nostrils daily. 16 g 6   gabapentin (NEURONTIN) 300 MG capsule TAKE 1 CAPSULE(300 MG) BY MOUTH THREE TIMES DAILY 90 capsule 11   glucose blood (CONTOUR NEXT TEST) test strip USE AS DIRECTED 100 strip 2   insulin detemir (LEVEMIR FLEXTOUCH) 100 UNIT/ML FlexPen Inject 60 Units into the skin every evening. 15 mL 5   Insulin Pen Needle (B-D UF III MINI PEN NEEDLES) 31G X 5 MM MISC USE FOR INJECTIONS TWICE DAILY 100  each 12   JARDIANCE 25 MG TABS tablet TAKE 1 TABLET(25 MG) BY MOUTH DAILY 30 tablet 3   ketoconazole (NIZORAL) 2 % cream Apply to affected areas one to two times daily as needed. 30 g 2   lamoTRIgine (LAMICTAL) 200 MG tablet Take 400 mg by mouth daily.     latanoprost (XALATAN) 0.005 % ophthalmic solution Place 1 drop into both eyes 2 (two) times a week.      LEVOXYL 125 MCG tablet Take 2 tablets (250 mcg total) by mouth daily before breakfast. (Patient taking differently: Take 250 mcg by mouth daily before breakfast. Plus 125 mcg every 7 days) 60 tablet 3   loperamide (IMODIUM A-D) 2 MG tablet Take 2 mg by mouth daily as needed for diarrhea or loose stools.     Melatonin 10 MG CAPS Take 10 mg by mouth at bedtime.     mesalamine (LIALDA) 1.2  g EC tablet TAKE 2 TABLETS BY MOUTH DAILY 180 tablet 1   metoprolol succinate (TOPROL-XL) 25 MG 24 hr tablet TAKE ONE-HALF TABLET BY MOUTH AS NEEDED AT BEDTIME FOR SYSTOLIC BLOOD PRESSURE GREATER THAN 100 45 tablet 3   Multiple Vitamin (MULTIVITAMIN) tablet Take 1 tablet by mouth daily. Woman 50 plus     nitroGLYCERIN (NITROSTAT) 0.4 MG SL tablet Place 1 tablet (0.4 mg total) under the tongue every 5 (five) minutes as needed for chest pain. 25 tablet 4   olmesartan (BENICAR) 5 MG tablet Take 1 tablet (5 mg total) by mouth daily. 90 tablet 3   ondansetron (ZOFRAN-ODT) 4 MG disintegrating tablet DISSOLVE 1 TABLET(4 MG) ON THE TONGUE EVERY 6 HOURS AS NEEDED FOR NAUSEA OR VOMITING 90 tablet 1   pantoprazole (PROTONIX) 40 MG tablet TAKE 1 TABLET(40 MG) BY MOUTH TWICE DAILY 180 tablet 1   polyethylene glycol (MIRALAX / GLYCOLAX) 17 g packet Take 17 g by mouth 3 (three) times daily as needed for moderate constipation, mild constipation or severe constipation. In water or beverage     Probiotic Product (CULTURELLE PROBIOTICS PO) Take 1 capsule by mouth daily. For woman     QUEtiapine (SEROQUEL XR) 400 MG 24 hr tablet Take 800 mg by mouth at bedtime.     Semaglutide, 1 MG/DOSE, (OZEMPIC, 1 MG/DOSE,) 4 MG/3ML SOPN Inject 1 mg as directed every Monday. (Patient taking differently: Inject 1 mg as directed once a week.) 3 mL 5   simethicone (MYLICON) 80 MG chewable tablet Chew 80 mg by mouth 4 (four) times daily.     valACYclovir (VALTREX) 1000 MG tablet TAKE 1 TABLET(1000 MG) BY MOUTH DAILY 30 tablet 5   VRAYLAR capsule Take 3 mg by mouth at bedtime.     zaleplon (SONATA) 10 MG capsule Take 10-20 mg by mouth at bedtime.     Tavaborole (KERYDIN) 5 % SOLN Apply 1 application topically at bedtime. Apply to affected area of left great toenail (Patient not taking: Reported on 02/22/2021) 10 mL 6   Albuterol Sulfate (PROAIR RESPICLICK) 468 (90 Base) MCG/ACT AEPB Inhale 1 puff into the lungs every 4 (four) hours as  needed (wheezing). 1 each 1   amoxicillin-clavulanate (AUGMENTIN) 875-125 MG tablet Take 1 tablet by mouth 2 (two) times daily. 14 tablet 0   benzonatate (TESSALON PERLES) 100 MG capsule Take 1 capsule (100 mg total) by mouth 3 (three) times daily as needed. 20 capsule 0   benzonatate (TESSALON) 200 MG capsule Take 1 capsule (200 mg total) by mouth 3 (three) times daily as needed.  30 capsule 1   No facility-administered medications prior to visit.    Allergies  Allergen Reactions   Ephedrine Other (See Comments)    Pt becomes hyper   Aspirin     REACTION: aggrivates colitis   Crestor [Rosuvastatin]     REACTION: increased lfts   Erythromycin     REACTION: GI upset   Nsaids     REACTION: aggrivate colitis   Oxycodone Other (See Comments)    Panic Attack    ROS Review of Systems  Constitutional:  Negative for chills and fever.  Gastrointestinal:  Negative for abdominal pain.  Genitourinary:  Positive for dysuria, frequency and urgency. Negative for difficulty urinating, flank pain, hematuria, pelvic pain and vaginal discharge.  Musculoskeletal:  Negative for arthralgias.     Objective:    Physical Exam Constitutional:      General: She is not in acute distress.    Appearance: Normal appearance. She is well-developed and well-groomed. She is obese. She is not ill-appearing.  HENT:     Mouth/Throat:     Pharynx: No pharyngeal swelling.     Tonsils: No tonsillar exudate.  Neck:     Thyroid: No thyroid mass.  Abdominal:     Tenderness: There is no abdominal tenderness.  Musculoskeletal:     Lumbar back: Normal. No tenderness.     Comments: No flank pain   Lymphadenopathy:     Cervical:     Right cervical: No superficial cervical adenopathy.    Left cervical: No superficial cervical adenopathy.  Neurological:     Mental Status: She is alert.    BP 136/84    Pulse (!) 106    Temp 97.7 F (36.5 C) (Temporal)    Ht $R'5\' 6"'VA$  (1.676 m)    Wt 208 lb 1 oz (94.4 kg)    LMP  08/23/2006 (Approximate) Comment: tubal ligation   SpO2 96%    BMI 33.58 kg/m  Wt Readings from Last 3 Encounters:  02/22/21 208 lb 1 oz (94.4 kg)  02/01/21 201 lb 2 oz (91.2 kg)  12/05/20 200 lb (90.7 kg)     Health Maintenance Due  Topic Date Due   Zoster Vaccines- Shingrix (1 of 2) Never done   COVID-19 Vaccine (3 - Pfizer risk series) 08/06/2019   PAP SMEAR-Modifier  01/31/2020   COLONOSCOPY (Pts 45-34yrs Insurance coverage will need to be confirmed)  05/13/2020   INFLUENZA VACCINE  08/14/2020   TETANUS/TDAP  12/04/2020   OPHTHALMOLOGY EXAM  01/23/2021    There are no preventive care reminders to display for this patient.  Lab Results  Component Value Date   TSH 6.93 (H) 02/12/2021   Lab Results  Component Value Date   WBC 6.1 12/05/2020   HGB 14.7 12/05/2020   HCT 44.5 12/05/2020   MCV 94.1 12/05/2020   PLT 249.0 12/05/2020   Lab Results  Component Value Date   NA 140 02/12/2021   K 4.6 02/12/2021   CO2 25 02/12/2021   GLUCOSE 113 (H) 02/12/2021   BUN 20 02/12/2021   CREATININE 0.91 02/12/2021   BILITOT 0.4 02/12/2021   ALKPHOS 79 02/12/2021   AST 24 02/12/2021   ALT 27 02/12/2021   PROT 6.4 02/12/2021   ALBUMIN 4.4 02/12/2021   CALCIUM 9.7 02/12/2021   ANIONGAP 9 05/01/2020   GFR 67.59 02/12/2021   Lab Results  Component Value Date   HGBA1C 6.2 02/12/2021      Assessment & Plan:   Problem List Items Addressed  This Visit       Genitourinary   Acute cystitis without hematuria    Suspected UTI due to sx, will treat pending results of urine culture and urinalysis. Increase oral fluid intake of water throughout the day. Can not rely on dipstick as pt currently taking azo. If fever/chills let me know.         Other   Dysuria - Primary    urine culture and urine routine micro pending results. Increase oral intake of fluid. Will treat due to having symptoms of UTI. May change antbx pending the results of the culture.      Relevant Orders   POCT  Urinalysis Dipstick (Automated) (Completed)   Urine Culture   Urinalysis, Routine w reflex microscopic    No orders of the defined types were placed in this encounter.   Follow-up: No follow-ups on file.    Eugenia Pancoast, FNP

## 2021-02-22 NOTE — Assessment & Plan Note (Signed)
urine culture and urine routine micro pending results. Increase oral intake of fluid. Will treat due to having symptoms of UTI. May change antbx pending the results of the culture.

## 2021-02-23 LAB — URINE CULTURE
MICRO NUMBER:: 12987207
Result:: NO GROWTH
SPECIMEN QUALITY:: ADEQUATE

## 2021-02-27 ENCOUNTER — Encounter: Payer: Self-pay | Admitting: Family

## 2021-03-05 ENCOUNTER — Ambulatory Visit: Payer: BC Managed Care – PPO | Admitting: Dermatology

## 2021-03-05 ENCOUNTER — Other Ambulatory Visit: Payer: Self-pay

## 2021-03-05 ENCOUNTER — Encounter: Payer: Self-pay | Admitting: Medical

## 2021-03-05 ENCOUNTER — Ambulatory Visit (INDEPENDENT_AMBULATORY_CARE_PROVIDER_SITE_OTHER): Payer: BC Managed Care – PPO | Admitting: Medical

## 2021-03-05 ENCOUNTER — Other Ambulatory Visit: Payer: Self-pay | Admitting: Family Medicine

## 2021-03-05 VITALS — BP 130/80 | HR 96 | Ht 66.5 in | Wt 196.0 lb

## 2021-03-05 DIAGNOSIS — I493 Ventricular premature depolarization: Secondary | ICD-10-CM | POA: Diagnosis not present

## 2021-03-05 DIAGNOSIS — E782 Mixed hyperlipidemia: Secondary | ICD-10-CM | POA: Diagnosis not present

## 2021-03-05 DIAGNOSIS — I2510 Atherosclerotic heart disease of native coronary artery without angina pectoris: Secondary | ICD-10-CM

## 2021-03-05 DIAGNOSIS — I10 Essential (primary) hypertension: Secondary | ICD-10-CM | POA: Diagnosis not present

## 2021-03-05 NOTE — Patient Instructions (Signed)
Medication Instructions:  Your physician has recommended you make the following change in your medication:   STOP taking clopidogrel (Plavix) on 04/06/21  *If you need a refill on your cardiac medications before your next appointment, please call your pharmacy*   Lab Work: None ordered  If you have labs (blood work) drawn today and your tests are completely normal, you will receive your results only by: [place] (if you have MyChart) OR A paper copy in the mail If you have any lab test that is abnormal or we need to change your treatment, we will call you to review the results.   Testing/Procedures: None ordered   Follow-Up: At [facility], you and your health needs are our priority.  As part of our continuing mission to provide you with exceptional heart care, we have created designated Provider Care Teams.  These Care Teams include your primary Cardiologist (physician) and Advanced Practice Providers (APPs -  Physician Assistants and Nurse Practitioners) who all work together to provide you with the care you need, when you need it.  We recommend signing up for the patient portal called "MyChart".  Sign up information is provided on this After Visit Summary.  MyChart is used to connect with patients for Virtual Visits (Telemedicine).  Patients are able to view lab/test results, encounter notes, upcoming appointments, etc.  Non-urgent messages can be sent to your provider as well.   To learn more about what you can do with MyChart, go to [email or web address].    Your next appointment:   6 month(s)  The format for your next appointment:   In Person  Provider:   You may see Ida Rogue, MD or one of the following Advanced Practice Providers on your designated Care Team:   Murray Hodgkins, NP Christell Faith, PA-C Cadence Kathlen Mody, [place]   Other Instructions N/A

## 2021-03-05 NOTE — Progress Notes (Signed)
Cardiology Office Note:    Date:  03/06/2021   ID:  Lindsay Fry, DOB 28-Sep-1960, MRN [id number]  PCP:  Abner Greenspan, MD  [facility] Cardiologist: Dr. Arvid [facility] Electrophysiologist:  None   Referring MD: Abner Greenspan, MD   Chief Complaint: discuss plavix   History of Present Illness:    Lindsay Fry is a 61 y.o. female with a hx of CAD, HTN, HLD, migraines, bipolar disorder 1, and tachycardia who presents for follow-up to discuss plavix.    She was admitted to the hospital in 08/2014 with a NSTEMI.  Cath at that time showed severe ostial ramus intermedius disease which was medically managed secondary to being a small vessel approximately 2 mm in diameter.  She otherwise had nonobstructive disease.  Echo showed an EF of 60 to 65%, no regional wall motion abnormalities, grade 1 diastolic dysfunction, no significant valvular abnormalities, normal RV systolic function, normal PASP.  Lower extremity ABIs in 09/2014 were normal.  Zio patch from 01/2016 showed a predominant rhythm of sinus with an average heart rate of 76 bpm (range 56 to 133 bpm), first-degree AV block was present, rare PACs, atrial couplets, PVCs, and ventricular triplets.  No significant arrhythmias were noted.  She was seen in 10/2016 with reported dyspnea on exertion and noted to have new anterior T wave changes.  Subsequent nuclear stress test was nonischemic.  She developed gait instability and memory disturbance late 2020 and was evaluated by neurology 01/2019.  MRI of the brain 02/2019 was unrevealing.  Carotid Dopplers normal 08/12/2019.  She was evaluated in late 2021 with dizziness and vertigo with symptoms improving with meclizine.  There was also some concern polypharmacy was contributing.  She was seen in the ED in 02/2020 with bradycardia, dyspnea, and presyncope with associated chest pain.  She was significantly hypokalemic with a potassium of 2.9.  High-sensitivity troponin negative.  She had  mistakenly taken an extra olmesartan.  Echo in 03/2020 showed an EF of 50 to 55%, no regional wall motion abnormalities, normal internal LV cavity size, grade 1 diastolic dysfunction, normal RV systolic function, and frequent PVCs were noted.  Zio patch showed a predominant rhythm of sinus with an average heart rate of 96 bpm with a range of 72 to 187 bpm, 1 run of NSVT lasting 5 beats, 1 run of SVT lasting 9 beats, rare PACs, frequent PVCs representing a 26.7% burden.  Coronary CTA in 03/2020 showed a calcium score of 105 which was the 88th percentile, occluded proximal ramus that appeared to be chronic, and 25 to 49% stenosis involving the mid LAD with recommendation to proceed with cardiac cath.  She underwent cardiac cath on 04/06/2020 which showed severe single-vessel CAD with 95% OM2 stenosis and CTO of the ramus intermedius/OM1 with noncritical disease of the LAD/diagonal branch is also being present.  She underwent successful PCI to the proximal OM2.    She contacted the office in 05/2020, noting symptoms of lightheadedness with BP of 85/54 upon standing with a heart rate of 114 bpm.  She was concerned this was related to olmesartan.  She was last seen in the office on 06/06/2020 and was doing well from a cardiac perspective.  Since undergoing PCI she has not had any further chest pain, and she was tolerating DAPT without issues.  BP log from home showed blood pressure had largely been reasonably controlled with an occasional elevated reading or soft reading.  Due to elevated readings, she increased  her olmesartan from 5 mg to 10 mg in mid May.  She was drinking approximately 4 cups of coffee per day and would sometimes drink Coke Zeros as well.  With noted significant PVC burden, she was started on Toprol XL 12.5 mg nightly, and olmesartan was decreased to 5 mg.  She was advised to decrease caffeine intake and increase water.   Referred to EP for PVCs 08/2020. She was asymptomatic they recommended watchful  waiting and 1 year follow-up.  Last seen 10/10/20 and had stable angina. No changes were made  Today, the patient is here to discuss holding Plavix before colonoscopy. She UC and father had colon cancer. This is not scheduled yet. Per Dr. Donivan Scull note, OK to hold Plavix, suspect 5-7 days. Patient denies chest pain or shortness of breath. No LLE, orthopnea, pnd. Patient is able to walk up a flight of stairs. She gained about 16 lbs, trying to lose it. Has a gym membership, but has nor been. EKG shows NSR with no ischemic changes.   Past Medical History:  Diagnosis Date   ADD (attention deficit disorder) 11/19/2012   Allergic rhinitis, cause unspecified    Anxiety state, unspecified    Arthritis    Asymptomatic postmenopausal status (age-related) (natural)    Atypical hyperplasia of left breast 2000   Atypical mole 03/16/2018   R paraspinal mid upper back, excision   Atypical mole 02/17/2018   R spinal upper back, moderate   Atypical mole 08/11/2017   L sternum, excision   Atypical mole 05/27/2017   L post neck, moderate   Benign neoplasm of other and unspecified site of the digestive system    Bipolar disorder ([place])    Breast cancer ([place])    Left Breast 73m invasive CA left uiq, dcis left uoq and a lot ADH, ALH in both breasts.   CAD (coronary artery disease)    a. 08/2014 NSTEMI/Cath: LM nl, LAD 50p, D1/D2 min irregs, LCX nl, OM1 40, LPDA nl, RI 99ost (2.053mvessel->med Rx), RCA nl, AM 60, nl EF; b. 11/2016 MV: EF 74%, no ischemia (performed 2/2 dyspnea & new inf TWI).   Carcinoma of left breast ([facility]/19/2016   Cataract    Chronic diastolic CHF (congestive heart failure) ([place]   a. 08/2014 Echo: EF 60-65%, Gr 1 DD, nl LA.   Clotting disorder ([facility]   on blood thiner, Plavix   Cyst    on Achilles tendon   Depression    Diabetes mellitus ([facility]   frank   Dysuria    Edema    Family history of malignant neoplasm of gastrointestinal tract    Fibromyalgia    GERD  (gastroesophageal reflux disease)    Glaucoma    pre glaucoma, 05/14/2018 pt. denies glaucoma   Headache    migraines   Hiatal hernia    History of alcoholism ([facility]   History of migraines    History of ovarian cyst    Hx of adenomatous colonic polyps    Insomnia, unspecified    Myocardial infarction ([facility]16   OCD (obsessive compulsive disorder)    Osteopenia 09/25/2016   Femoral neck T -1.1  9/18   Other screening mammogram    Pure hypercholesterolemia    Rosacea    Subacute confusional state 11/19/2012   Tubular adenoma of colon 01/10/11   Ulcerative colitis, unspecified    Unspecified asthma(493.90)    Unspecified essential hypertension    Unspecified hypothyroidism    Unspecified  vitamin D deficiency    Vertigo     Past Surgical History:  Procedure Laterality Date   APPENDECTOMY     AXILLARY SENTINEL NODE BIOPSY Left 05/23/2014   Procedure: AXILLARY SENTINEL NODE BIOPSY;  Surgeon: Christene Lye, MD;  Location: [facility];  Service: General;  Laterality: Left;   BREAST BIOPSY  08/1998   on tamoxifen, atypical hyperplasia   BREAST BIOPSY Left 04/2014   BREAST SURGERY Left 08/1998   lumpectomy/ Dr Sharlet Salina   BREAST SURGERY Bilateral 05/23/2014   Mastectomy   BUNIONECTOMY Right    CARDIAC CATHETERIZATION N/A 09/05/2014   Procedure: Left Heart Cath and Coronary Angiography;  Surgeon: Wellington Hampshire, MD;  Location: [place] [facility];  Service: Cardiovascular;  Laterality: N/A;   CARDIAC CATHETERIZATION  09/05/2014   CATARACT EXTRACTION W/ INTRAOCULAR LENS IMPLANT Bilateral    CESAREAN SECTION  1996/1997   placenta previa, gest DM, pre-eclampsia   CHOLECYSTECTOMY  1997   adhesions also   COLONOSCOPY  01/2001   Ulcerative colitis   COLONOSCOPY  04/2004   UC, polyp   COLONOSCOPY  12/2006   UC, no polyps   CORONARY STENT INTERVENTION N/A 04/06/2020   Procedure: CORONARY STENT INTERVENTION;  Surgeon: Nelva Bush, MD;  Location: [place] [facility];   Service: Cardiovascular;  Laterality: N/A;   DEXA  04/1999 and 2010   normal   ESOPHAGOGASTRODUODENOSCOPY  09/2001   polyp   exercise stress test  11/2003   negative   FEMUR FRACTURE SURGERY Left    LEFT HEART CATH AND CORONARY ANGIOGRAPHY N/A 04/06/2020   Procedure: LEFT HEART CATH AND CORONARY ANGIOGRAPHY;  Surgeon: Minna Merritts, MD;  Location: [place] [facility];  Service: Cardiovascular;  Laterality: N/A;   MASTECTOMY Bilateral    MECKEL DIVERTICULUM EXCISION  1999   NASAL SINUS SURGERY  05/1997   nuclear stress test  06/2007   negative   precancerous mole removed     RIGHT AND LEFT HEART CATH     with 1 stent placed in March 2022   SIMPLE MASTECTOMY WITH AXILLARY SENTINEL NODE BIOPSY Bilateral 05/23/2014   Procedure: Bilateral simple mastectomy, left sentinel node biopsy ;  Surgeon: Christene Lye, MD;  Location: [facility];  Service: General;  Laterality: Bilateral;   Sleep study  11/2007   no apnea, but did snore (done by [facility])   [place] EXTRACTION  1980's    Current Medications: Current Meds  Medication Sig   acetaminophen (TYLENOL) 650 MG CR tablet Take 1,300 mg by mouth 2 (two) times daily.   amantadine (SYMMETREL) 100 MG capsule Take 100 mg by mouth 2 (two) times daily.   Ascorbic Acid (VITAMIN C PO) Take 1,000 mg by mouth daily.   aspirin 81 MG chewable tablet Chew 1 tablet (81 mg total) by mouth daily.   atorvastatin (LIPITOR) 80 MG tablet TAKE 1 TABLET BY MOUTH ONCE EVERY EVENING   B Complex Vitamins (VITAMIN B COMPLEX PO) Take 1 tablet by mouth every other day.   BAYER MICROLET LANCETS lancets USE AS DIRECTED TO CHECK BLOOD SUGAR TWICE DAILY   Blood Glucose Monitoring Suppl (CONTOUR NEXT MONITOR) w/Device KIT 1 each by Does not apply route in the morning and at bedtime. Use Contour Next meter to check blood sugar twice daily.   Calcium Carb-Cholecalciferol (CALTRATE 600+D3 PO) Take 1 tablet by mouth  daily. 300 mg Vitamin D3   clopidogrel (  PLAVIX) 75 MG tablet Take 1 tablet (75 mg total) by mouth daily.   Co-Enzyme Q-10 100 MG CAPS Take 100 mg by mouth daily.   dextroamphetamine (DEXEDRINE SPANSULE) 15 MG 24 hr capsule Take 15 mg by mouth as needed.   diazepam (VALIUM) 10 MG tablet Take 5-10 mg by mouth daily as needed for anxiety.   diphenhydrAMINE (BENADRYL) 25 MG tablet Take 50 mg by mouth at bedtime.   docusate sodium (COLACE) 100 MG capsule Take 100-300 mg by mouth daily as needed for mild constipation or moderate constipation.   ezetimibe (ZETIA) 10 MG tablet TAKE 1 TABLET(10 MG) BY MOUTH DAILY   famotidine (PEPCID) 40 MG tablet Take 40 mg by mouth 2 (two) times daily. 30 min. Before meal and at bedtime   fluticasone (FLONASE) 50 MCG/ACT nasal spray Place 1 spray into both nostrils daily.   glucose blood (CONTOUR NEXT TEST) test strip USE AS DIRECTED   insulin detemir (LEVEMIR FLEXTOUCH) 100 UNIT/ML FlexPen Inject 60 Units into the skin every evening.   Insulin Pen Needle (B-D UF III MINI PEN NEEDLES) 31G X 5 MM MISC USE FOR INJECTIONS TWICE DAILY   JARDIANCE 25 MG TABS tablet TAKE 1 TABLET(25 MG) BY MOUTH DAILY   ketoconazole (NIZORAL) 2 % cream Apply to affected areas one to two times daily as needed.   lamoTRIgine (LAMICTAL) 200 MG tablet Take 400 mg by mouth daily.   latanoprost (XALATAN) 0.005 % ophthalmic solution Place 1 drop into both eyes 2 (two) times a week.    levothyroxine (SYNTHROID) 125 MCG tablet Take 125 mcg by mouth daily before breakfast. Take 2 tablets PO 6 days per week and take 3 tablets PO on day #7 (Mondays)   loperamide (IMODIUM A-D) 2 MG tablet Take 2 mg by mouth daily as needed for diarrhea or loose stools.   Melatonin 10 MG CAPS Take 10 mg by mouth at bedtime.   mesalamine (LIALDA) 1.2 g EC tablet TAKE 2 TABLETS BY MOUTH DAILY   metoprolol succinate (TOPROL-XL) 25 MG 24 hr tablet TAKE ONE-HALF TABLET BY MOUTH AS NEEDED AT BEDTIME FOR SYSTOLIC BLOOD PRESSURE  GREATER THAN 100   Multiple Vitamin (MULTIVITAMIN) tablet Take 1 tablet by mouth daily. Woman 50 plus   nitroGLYCERIN (NITROSTAT) 0.4 MG SL tablet Place 1 tablet (0.4 mg total) under the tongue every 5 (five) minutes as needed for chest pain.   olmesartan (BENICAR) 5 MG tablet Take 1 tablet (5 mg total) by mouth daily.   ondansetron (ZOFRAN-ODT) 4 MG disintegrating tablet DISSOLVE 1 TABLET(4 MG) ON THE TONGUE EVERY 6 HOURS AS NEEDED FOR NAUSEA OR VOMITING   pantoprazole (PROTONIX) 40 MG tablet TAKE 1 TABLET(40 MG) BY MOUTH TWICE DAILY   polyethylene glycol (MIRALAX / GLYCOLAX) 17 g packet Take 17 g by mouth 3 (three) times daily as needed for moderate constipation, mild constipation or severe constipation. In water or beverage   Probiotic Product (CULTURELLE PROBIOTICS PO) Take 1 capsule by mouth daily. For woman   QUEtiapine (SEROQUEL XR) 400 MG 24 hr tablet Take 800 mg by mouth at bedtime.   Semaglutide, 1 MG/DOSE, 4 MG/3ML SOPN Inject 1 mg into the skin once a week. Wednesdays   simethicone (MYLICON) 80 MG chewable tablet Chew 80 mg by mouth 4 (four) times daily.   Tavaborole (KERYDIN) 5 % SOLN Apply 1 application topically at bedtime. Apply to affected area of left great toenail   VRAYLAR capsule Take 3 mg by mouth at bedtime.  zaleplon (SONATA) 10 MG capsule Take 10-20 mg by mouth at bedtime.   [DISCONTINUED] gabapentin (NEURONTIN) 300 MG capsule TAKE 1 CAPSULE(300 MG) BY MOUTH THREE TIMES DAILY   [DISCONTINUED] valACYclovir (VALTREX) 1000 MG tablet TAKE 1 TABLET(1000 MG) BY MOUTH DAILY     Allergies:   Ephedrine, Aspirin, Crestor [rosuvastatin], Erythromycin, Nsaids, and Oxycodone   Social History   Socioeconomic History   Marital status: Married    Spouse name: alan   Number of children: 2   Years of education: college   Highest education level: Not on file  Occupational History   Occupation: unemployed  Tobacco Use   Smoking status: Former    Packs/day: 0.25    Years: 5.00     Pack years: 1.25    Types: Cigarettes    Quit date: 01/14/1993    Years since quitting: 28.1   Smokeless tobacco: Never  Vaping Use   Vaping Use: Never used  Substance and Sexual Activity   Alcohol use: No    Alcohol/week: 0.0 standard drinks    Comment: Recovered ETOH   Drug use: No   Sexual activity: Not Currently    Partners: Male  Other Topics Concern   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      [employer] telephone triage   Social Determinants of Health   Financial Resource Strain: Not on file  Food Insecurity: Not on file  Transportation Needs: Not on file  Physical Activity: Not on file  Stress: Not on file  Social Connections: Not on file     Family History: The patient's family history includes Alzheimer's disease in her father; Breast cancer in an other family member; Colon cancer in her father; Colon polyps in her father; Coronary artery disease in her father, mother, and another family member; Crohn's disease in her mother; Dementia in her father; Diabetes in an other family member; Hypertension in her mother; Osteoporosis in her mother; Stroke in her cousin. There is no history of Esophageal cancer, Rectal cancer, or Stomach cancer.  ROS:   Please see the history of present illness.     All other systems reviewed and are negative.  EKGs/Labs/Other Studies Reviewed:    The following studies were reviewed today:  Heart monitor 06/2020 Event monitor Patch Wear Time:  3 days and 2 hours (2022-06-07T11:45:24-0400 to 2022-06-10T13:51:58-0400)   Patient had a min HR of 61 bpm, max HR of 124 bpm, and avg HR of 86 bpm.  Predominant underlying rhythm was Sinus Rhythm.    No Isolated SVEs, SVE Couplets, or SVE Triplets were present. Isolated VEs were frequent (16.1%, 54738), VE Couplets were rare  (<1.0%, 226), and no VE Triplets were present. Ventricular Bigeminy  and Trigeminy were present.    No patient triggered events recorded   Signed, Esmond Plants, MD, Ph.D [facility]  Cardiac cath 03/2020 Conclusions: Severe single-vessel coronary artery disease with 95% OM 2 stenosis and chronic total occlusion of ramus intermedius/OM1, as detailed in Dr. Donivan Scull diagnostic catheterization note.  Noncritical disease of the LAD/diagonal branches is also present. Successful PCI to proximal OM2 using Resolute Onyx 2.75 x 12 mm drug-eluting stent with 0% residual stenosis and TIMI-3 flow.   Conclusions: Favor dual antiplatelet therapy with aspirin and clopidogrel for up to 12 months.  Given questionable intolerance of aspirin I would  favor trying for at least 1 month of DAPT before switching to clopidogrel alone. Obtain cytochrome P450 2C19 genotype to ensure appropriate response to clopidogrel. Aggressive secondary prevention. Overnight observation with plans for discharge home tomorrow if no post catheterization complications.   Nelva Bush, MD [facility]  Antiplatelet/Anticoag Recommend uninterrupted dual antiplatelet therapy with Aspirin 19m daily and Clopidogrel 771mdaily. Given questionable intolerance of aspirin, would favor dual antiplatelet therapy with aspirin and clopidogrel for at least a month (ideally longer) after which time aspirin could be discontinued if necessary.  Cytochrome P450 2C19 genotyping could be considered to ensure that the patient is an adequate clopidogrel responder if she needs to remain on a long-term antiplatelet therapy with clopidogrel alone.  Discharge Date In the absence of any other complications or medical issues, we expect the patient to be ready for discharge from an interventional cardiology perspective on 04/07/2020.     Coronary Diagrams  Diagnostic Dominance: Left Intervention     Echo 03/2020  1. Left ventricular ejection fraction, by estimation, is 50 to 55%. The  left ventricle has low normal  function. The left ventricle has no regional  wall motion abnormalities. Left ventricular diastolic parameters are  consistent with Grade I diastolic  dysfunction (impaired relaxation).   2. Right ventricular systolic function is normal. The right ventricular  size is normal.   3. Frequent PVCs    EKG:  EKG is  ordered today.  The ekg ordered today demonstrates NSR, 96bpm, LAD, TWI aVL, no significant changes from prior  Recent Labs: 06/06/2020: Magnesium 2.4 12/05/2020: Hemoglobin 14.7; Platelets 249.0 02/12/2021: ALT 27; BUN 20; Creatinine, Ser 0.91; Potassium 4.6; Sodium 140; TSH 6.93  Recent Lipid Panel    Component Value Date/Time   CHOL 166 02/12/2021 1117   TRIG 194.0 (H) 02/12/2021 1117   HDL 50.70 02/12/2021 1117   CHOLHDL 3 02/12/2021 1117   VLDL 38.8 02/12/2021 1117   LDLCALC 77 02/12/2021 1117   LDLDIRECT 125.0 02/07/2020 1134     Physical Exam:    VS:  BP 130/80 (BP Location: Left Arm, Patient Position: Sitting, Cuff Size: Large)    Pulse 96    Ht 5' 6.5" (1.689 m)    Wt 196 lb (88.9 kg)    LMP 08/23/2006 (Approximate) Comment: tubal ligation   SpO2 98%    BMI 31.16 kg/m     Wt Readings from Last 3 Encounters:  03/06/21 204 lb (92.5 kg)  03/05/21 196 lb (88.9 kg)  02/22/21 208 lb 1 oz (94.4 kg)     GEN:  Well nourished, well developed in no acute distress HEENT: Normal NECK: No JVD; No carotid bruits LYMPHATICS: No lymphadenopathy CARDIAC: RRR, no murmurs, rubs, gallops RESPIRATORY:  Clear to auscultation without rales, wheezing or rhonchi  ABDOMEN: Soft, non-tender, non-distended MUSCULOSKELETAL:  No edema; No deformity  SKIN: Warm and dry NEUROLOGIC:  Alert and oriented x 3 PSYCHIATRIC:  Normal affect   ASSESSMENT:    1. Coronary artery disease involving native coronary artery of native heart without angina pectoris   2. Frequent PVCs   3. Essential hypertension   4. PVC's (premature ventricular contractions)   5. Hyperlipidemia, mixed    PLAN:     In order of problems listed above:  CAD s/p DES OM2 03/2020 with residual disease Stent placed 04/06/20 with recommendations for DAPT with Asprin and Plavix for 1 year, this will be completed next month 04/06/21. Patient is needing a routine colonoscopy. OK to hod Plavix prior to  procedure, and discontinue indefinitely on 04/06/21. Continue statin, Zetia, and Toprol. No further ischemic work-up indicated.   Frequent PVCs Denies palpitations. Continue Toprol -XL 44m daily.   HTN BP good today. Continue Toprol and Benicar  HLD LDL 77, goal <70. Continue Zetia and Lipitor.   Pre-op clearance for colonoscopy.  Patient needing routine colonoscopy. She is stable from a cardiac perspective. According to the Revised Cardiac Risk Index she is a Class 2 risk, 6% 30 day risk of death, MI or cardiac arrest. METS>4. OK to indefinitely discontinue Plavix 04/06/21 as it will be a year post-stent. No further ischemic work-up indicated prior to procedure.  Disposition: Follow up in 6 month(s) with MD/APP     Signed,  HNinfa Meeker PA-C  03/06/2021 1:33 PM    [facility]

## 2021-03-06 ENCOUNTER — Ambulatory Visit (INDEPENDENT_AMBULATORY_CARE_PROVIDER_SITE_OTHER): Payer: BC Managed Care – PPO | Admitting: Endocrinology

## 2021-03-06 ENCOUNTER — Other Ambulatory Visit: Payer: Self-pay

## 2021-03-06 ENCOUNTER — Encounter: Payer: Self-pay | Admitting: Endocrinology

## 2021-03-06 VITALS — BP 132/82 | HR 107 | Ht 66.5 in | Wt 204.0 lb

## 2021-03-06 DIAGNOSIS — E782 Mixed hyperlipidemia: Secondary | ICD-10-CM | POA: Diagnosis not present

## 2021-03-06 DIAGNOSIS — E890 Postprocedural hypothyroidism: Secondary | ICD-10-CM | POA: Diagnosis not present

## 2021-03-06 DIAGNOSIS — E669 Obesity, unspecified: Secondary | ICD-10-CM

## 2021-03-06 DIAGNOSIS — E1169 Type 2 diabetes mellitus with other specified complication: Secondary | ICD-10-CM

## 2021-03-06 NOTE — Progress Notes (Signed)
Patient ID: Lindsay Fry, female   DOB: [birth year], 61 y.o.   MRN: [id number]   Reason for Appointment:   followup visit  History of Present Illness:  DIABETES:  Prior history: With her high A1c of 6.7 in 03/2014 she had an abnormal glucose tolerance test indicating diabetes and 1 hour glucose of 300 She was started on metformin; however even with 1500 mg of metformin ER  blood sugars were not controlled especially fasting Subsequently with taking Janumet XR 100/1000 daily her blood sugars were much better Because of her weight gain and A1c going up to 7.3 along with occasional readings over 200 she was started on Victoza in 11/16 She has had progressive hyperglycemia previously and A1c was up to 8.6% in 4/17  She was started on Levemir insulin in 08/2015 because of progressive rise in blood sugars and inability to control with 3 other agents   Non-insulin hypoglycemic drugs: Ozempic 1 mg weekly; metformin ER 2000 mg daily, Jardiance 25 mg daily  INSULIN regimen: Levemir 60 units at bedtime  A1c is 6.2 and consistently improved A1c was done last month   Current management, blood sugar patterns and problems identified: Although her A1c is about the same her blood sugars are not periodically higher in the morning or after meals  She thinks she is getting more sweets like cakes recently  Also is not exercising except a little walking  Still unable to lose weight and her weight is about the same as on her last visit in 10/22  She is again checking more readings after waking up and less after dinner  Blood sugars are relatively lower before dinnertime She has not been able to exercise because of her pool closing  Has been regular with taking Ozempic 1 mg  Has been on the same dose of Levemir again  GLUCOSE readings from review of the CONTOUR monitor review   PRE-MEAL Fasting Lunch Dinner Bedtime Overall  Glucose range: 91-157      Mean/median: 127    124   POST-MEAL  PC Breakfast PC Lunch PC Dinner  Glucose range:   138-184  Mean/median:       Previous glucose range 92-158 before first meal with AVERAGE about 120  Weight history:  Wt Readings from Last 3 Encounters:  03/06/21 204 lb (92.5 kg)  03/05/21 196 lb (88.9 kg)  02/22/21 208 lb 1 oz (94.4 kg)   Previous consultation with dietitian: 2016    LABS:  Lab Results  Component Value Date   HGBA1C 6.2 02/12/2021   HGBA1C 6.0 10/12/2020   HGBA1C 5.9 06/05/2020   Lab Results  Component Value Date   MICROALBUR <0.7 10/12/2020   [place] 77 02/12/2021   CREATININE 0.91 02/12/2021   HYPOTHYROIDISM: This was first diagnosed in 1992 after her treatment for Graves' disease with I-131 She has been on relatively large doses of thyroxine supplements She was on 224 mcg since 11/13 and her TSH was normal in 3/14 Subjectively difficult to assess her thyroid since she tends to have fatigue chronically.  In 2014 her dose was reduced to 200 mcg daily and her dose has been fluctuating since then She had required periodic increase in dosage including in 05/2014 when her TSH was 20  She is on 250 g dosage, continues on the brand name LEVOXYL, this is less expensive than Synthroid  She feels fairly good overall  She has been regular with the Levoxyl every day before breakfast on empty stomach  Since her TSH was high about 3 weeks ago she is not taking an extra 125 mcg on Mondays of her Levoxyl         TSH as follows  Lab Results  Component Value Date   TSH 6.93 (H) 02/12/2021   TSH 3.53 10/12/2020   TSH 3.12 06/05/2020   FREET4 0.73 02/12/2021   FREET4 1.04 10/12/2020   FREET4 0.58 (L) 06/05/2020     Allergies as of 03/06/2021       Reactions   Ephedrine Other (See Comments)   Pt becomes hyper   Aspirin    REACTION: aggrivates colitis Tolerates 81 mg but unable to take doses >81 mg   Crestor [rosuvastatin]    REACTION: increased lfts   Erythromycin    REACTION: GI upset   Nsaids     REACTION: aggrivate colitis   Oxycodone Other (See Comments)   Panic Attack        Medication List        Accurate as of March 06, 2021  3:07 PM. If you have any questions, ask your nurse or doctor.          acetaminophen 650 MG CR tablet Commonly known as: TYLENOL Take 1,300 mg by mouth 2 (two) times daily.   amantadine 100 MG capsule Commonly known as: SYMMETREL Take 100 mg by mouth 2 (two) times daily.   aspirin 81 MG chewable tablet Chew 1 tablet (81 mg total) by mouth daily.   atorvastatin 80 MG tablet Commonly known as: LIPITOR TAKE 1 TABLET BY MOUTH ONCE EVERY EVENING   B-D UF III MINI PEN NEEDLES 31G X 5 MM Misc Generic drug: Insulin Pen Needle USE FOR INJECTIONS TWICE DAILY   Bayer Microlet Lancets lancets USE AS DIRECTED TO CHECK BLOOD SUGAR TWICE DAILY   CALTRATE 600+D3 PO Take 1 tablet by mouth daily. 300 mg Vitamin D3   clopidogrel 75 MG tablet Commonly known as: PLAVIX Take 1 tablet (75 mg total) by mouth daily.   Co-Enzyme Q-10 100 MG Caps Take 100 mg by mouth daily.   Contour Next Monitor w/Device Kit 1 each by Does not apply route in the morning and at bedtime. Use Contour Next meter to check blood sugar twice daily.   Contour Next Test test strip Generic drug: glucose blood USE AS DIRECTED   CULTURELLE PROBIOTICS PO Take 1 capsule by mouth daily. For woman   dextroamphetamine 15 MG 24 hr capsule Commonly known as: DEXEDRINE SPANSULE Take 15 mg by mouth as needed.   diazepam 10 MG tablet Commonly known as: VALIUM Take 5-10 mg by mouth daily as needed for anxiety.   diphenhydrAMINE 25 MG tablet Commonly known as: BENADRYL Take 50 mg by mouth at bedtime.   docusate sodium 100 MG capsule Commonly known as: COLACE Take 100-300 mg by mouth daily as needed for mild constipation or moderate constipation.   ezetimibe 10 MG tablet Commonly known as: ZETIA TAKE 1 TABLET(10 MG) BY MOUTH DAILY   famotidine 40 MG  tablet Commonly known as: PEPCID Take 40 mg by mouth 2 (two) times daily. 30 min. Before meal and at bedtime   fluticasone 50 MCG/ACT nasal spray Commonly known as: FLONASE Place 1 spray into both nostrils daily.   gabapentin 300 MG capsule Commonly known as: NEURONTIN TAKE 1 CAPSULE BY MOUTH 3 TIMES DAILY   Jardiance 25 MG Tabs tablet Generic drug: empagliflozin TAKE 1 TABLET(25 MG) BY MOUTH DAILY   ketoconazole 2 % cream Commonly known  as: NIZORAL Apply to affected areas one to two times daily as needed.   lamoTRIgine 200 MG tablet Commonly known as: LAMICTAL Take 400 mg by mouth daily.   latanoprost 0.005 % ophthalmic solution Commonly known as: XALATAN Place 1 drop into both eyes 2 (two) times a week.   Levemir FlexTouch 100 UNIT/ML FlexPen Generic drug: insulin detemir Inject 60 Units into the skin every evening.   levothyroxine 125 MCG tablet Commonly known as: SYNTHROID Take 125 mcg by mouth daily before breakfast. Take 2 tablets PO 6 days per week and take 3 tablets PO on day #7 (Mondays)   loperamide 2 MG tablet Commonly known as: IMODIUM A-D Take 2 mg by mouth daily as needed for diarrhea or loose stools.   Melatonin 10 MG Caps Take 10 mg by mouth at bedtime.   mesalamine 1.2 g EC tablet Commonly known as: LIALDA TAKE 2 TABLETS BY MOUTH DAILY   metoprolol succinate 25 MG 24 hr tablet Commonly known as: TOPROL-XL TAKE ONE-HALF TABLET BY MOUTH AS NEEDED AT BEDTIME FOR SYSTOLIC BLOOD PRESSURE GREATER THAN 100   multivitamin tablet Take 1 tablet by mouth daily. Woman 50 plus   nitroGLYCERIN 0.4 MG SL tablet Commonly known as: NITROSTAT Place 1 tablet (0.4 mg total) under the tongue every 5 (five) minutes as needed for chest pain.   olmesartan 5 MG tablet Commonly known as: BENICAR Take 1 tablet (5 mg total) by mouth daily.   ondansetron 4 MG disintegrating tablet Commonly known as: ZOFRAN-ODT DISSOLVE 1 TABLET(4 MG) ON THE TONGUE EVERY 6 HOURS AS  NEEDED FOR NAUSEA OR VOMITING   pantoprazole 40 MG tablet Commonly known as: PROTONIX TAKE 1 TABLET(40 MG) BY MOUTH TWICE DAILY   polyethylene glycol 17 g packet Commonly known as: MIRALAX / GLYCOLAX Take 17 g by mouth 3 (three) times daily as needed for moderate constipation, mild constipation or severe constipation. In water or beverage   QUEtiapine 400 MG 24 hr tablet Commonly known as: SEROQUEL XR Take 800 mg by mouth at bedtime.   Semaglutide (1 MG/DOSE) 4 MG/3ML Sopn Inject 1 mg into the skin once a week. Wednesdays   simethicone 80 MG chewable tablet Commonly known as: MYLICON Chew 80 mg by mouth 4 (four) times daily.   Tavaborole 5 % Soln Commonly known as: Kerydin Apply 1 application topically at bedtime. Apply to affected area of left great toenail   valACYclovir 1000 MG tablet Commonly known as: VALTREX TAKE 1 TABLET BY MOUTH ONCE DAILY   VITAMIN B COMPLEX PO Take 1 tablet by mouth every other day.   VITAMIN C PO Take 1,000 mg by mouth daily.   Vraylar 3 MG capsule Generic drug: cariprazine Take 3 mg by mouth at bedtime.   zaleplon 10 MG capsule Commonly known as: SONATA Take 10-20 mg by mouth at bedtime.        Allergies:  Allergies  Allergen Reactions   Ephedrine Other (See Comments)    Pt becomes hyper   Aspirin     REACTION: aggrivates colitis Tolerates 81 mg but unable to take doses >81 mg   Crestor [Rosuvastatin]     REACTION: increased lfts   Erythromycin     REACTION: GI upset   Nsaids     REACTION: aggrivate colitis   Oxycodone Other (See Comments)    Panic Attack    Past Medical History:  Diagnosis Date   ADD (attention deficit disorder) 11/19/2012   Allergic rhinitis, cause unspecified  Anxiety state, unspecified    Arthritis    Asymptomatic postmenopausal status (age-related) (natural)    Atypical hyperplasia of left breast 2000   Atypical mole 03/16/2018   R paraspinal mid upper back, excision   Atypical mole  02/17/2018   R spinal upper back, moderate   Atypical mole 08/11/2017   L sternum, excision   Atypical mole 05/27/2017   L post neck, moderate   Benign neoplasm of other and unspecified site of the digestive system    Bipolar disorder ([place])    Breast cancer ([place])    Left Breast 63mm invasive CA left uiq, dcis left uoq and a lot ADH, ALH in both breasts.   CAD (coronary artery disease)    a. 08/2014 NSTEMI/Cath: LM nl, LAD 50p, D1/D2 min irregs, LCX nl, OM1 40, LPDA nl, RI 99ost (2.45mm vessel->med Rx), RCA nl, AM 60, nl EF; b. 11/2016 MV: EF 74%, no ischemia (performed 2/2 dyspnea & new inf TWI).   Carcinoma of left breast ([place]) 06/02/2014   Cataract    Chronic diastolic CHF (congestive heart failure) ([place])    a. 08/2014 Echo: EF 60-65%, Gr 1 DD, nl LA.   Clotting disorder ([place])    on blood thiner, Plavix   Cyst    on Achilles tendon   Depression    Diabetes mellitus ([place])    frank   Dysuria    Edema    Family history of malignant neoplasm of gastrointestinal tract    Fibromyalgia    GERD (gastroesophageal reflux disease)    Glaucoma    pre glaucoma, 05/14/2018 pt. denies glaucoma   Headache    migraines   Hiatal hernia    History of alcoholism ([place])    History of migraines    History of ovarian cyst    Hx of adenomatous colonic polyps    Insomnia, unspecified    Myocardial infarction ([place]) 09-04-14   OCD (obsessive compulsive disorder)    Osteopenia 09/25/2016   Femoral neck T -1.1  9/18   Other screening mammogram    Pure hypercholesterolemia    Rosacea    Subacute confusional state 11/19/2012   Tubular adenoma of colon 01/10/11   Ulcerative colitis, unspecified    Unspecified asthma(493.90)    Unspecified essential hypertension    Unspecified hypothyroidism    Unspecified vitamin D deficiency    Vertigo     Past Surgical History:  Procedure Laterality Date   APPENDECTOMY     AXILLARY SENTINEL NODE BIOPSY Left 05/23/2014   Procedure: AXILLARY SENTINEL NODE BIOPSY;   Surgeon: Christene Lye, MD;  Location: [facility];  Service: General;  Laterality: Left;   BREAST BIOPSY  08/1998   on tamoxifen, atypical hyperplasia   BREAST BIOPSY Left 04/2014   BREAST SURGERY Left 08/1998   lumpectomy/ Dr Sharlet Salina   BREAST SURGERY Bilateral 05/23/2014   Mastectomy   BUNIONECTOMY Right    CARDIAC CATHETERIZATION N/A 09/05/2014   Procedure: Left Heart Cath and Coronary Angiography;  Surgeon: Wellington Hampshire, MD;  Location: [facility];  Service: Cardiovascular;  Laterality: N/A;   CARDIAC CATHETERIZATION  09/05/2014   CATARACT EXTRACTION W/ INTRAOCULAR LENS IMPLANT Bilateral    CESAREAN SECTION  1996/1997   placenta previa, gest DM, pre-eclampsia   CHOLECYSTECTOMY  1997   adhesions also   COLONOSCOPY  01/2001   Ulcerative colitis   COLONOSCOPY  04/2004   UC, polyp   COLONOSCOPY  12/2006   UC, no polyps   CORONARY  STENT INTERVENTION N/A 04/06/2020   Procedure: CORONARY STENT INTERVENTION;  Surgeon: Nelva Bush, MD;  Location: [facility];  Service: Cardiovascular;  Laterality: N/A;   DEXA  04/1999 and 2010   normal   ESOPHAGOGASTRODUODENOSCOPY  09/2001   polyp   exercise stress test  11/2003   negative   FEMUR FRACTURE SURGERY Left    LEFT HEART CATH AND CORONARY ANGIOGRAPHY N/A 04/06/2020   Procedure: LEFT HEART CATH AND CORONARY ANGIOGRAPHY;  Surgeon: Minna Merritts, MD;  Location: [facility];  Service: Cardiovascular;  Laterality: N/A;   MASTECTOMY Bilateral    MECKEL DIVERTICULUM EXCISION  1999   NASAL SINUS SURGERY  05/1997   nuclear stress test  06/2007   negative   precancerous mole removed     RIGHT AND LEFT HEART CATH     with 1 stent placed in March 2022   SIMPLE MASTECTOMY WITH AXILLARY SENTINEL NODE BIOPSY Bilateral 05/23/2014   Procedure: Bilateral simple mastectomy, left sentinel node biopsy ;  Surgeon: Christene Lye, MD;  Location: [facility];  Service: General;  Laterality: Bilateral;    Sleep study  11/2007   no apnea, but did snore (done by [facility])   [place] EXTRACTION  34's    Family History  Problem Relation Age of Onset   Coronary artery disease Father    Colon cancer Father    Alzheimer's disease Father    Dementia Father    Colon polyps Father    Coronary artery disease Mother    Hypertension Mother    Osteoporosis Mother    Crohn's disease Mother        crohns colitis   Breast cancer Other        great aunts   Coronary artery disease Other        Uncle (also AAA)   Diabetes Other        remote family history   Stroke Cousin    Esophageal cancer Neg Hx    Rectal cancer Neg Hx    Stomach cancer Neg Hx     Social History:  reports that she quit smoking about 28 years ago. Her smoking use included cigarettes. She has a 1.25 pack-year smoking history. She has never used smokeless tobacco. She reports that she does not drink alcohol and does not use drugs.  REVIEW Of SYSTEMS:  Bone health: She is taking OTC vitamin D3, ? 2000 units and has been on calcium recommended by her oncologist Vitamin D level therapeutic  Lab Results  Component Value Date   VD25OH 43.86 03/24/2020   VD25OH 41.98 06/03/2019    Bone density in 9/15 shows T score -1.1 at the hip  Lab Results  Component Value Date   PTH 18 10/25/2016   CALCIUM 9.7 02/12/2021   PHOS 2.3 01/29/2007     Lab Results  Component Value Date   CALCIUM 9.7 02/12/2021   PHOS 2.3 01/29/2007   Lab Results  Component Value Date   VD25OH 43.86 03/24/2020   VD25OH 41.98 06/03/2019   VD25OH 39.94 04/21/2019     HYPERTENSION: Blood pressure is managed by cardiologist.   taking 5 mg Benicar but will skip the dose if blood pressure is low  Lab Results  Component Value Date   CREATININE 0.91 02/12/2021   BUN 20 02/12/2021   NA 140 02/12/2021   K 4.6 02/12/2021   CL 104 02/12/2021  CO2 25 02/12/2021    History of hypercholesterolemia  with known CAD: Management by cardiologist and has been on 80 mg atorvastatin since her MI She is also taking Zetia that was added subsequently Repatha was approved but she was told by cardiologist not to start this  Not clear why her LDL is going up along with triglycerides, recently may be getting more sweets  Lab Results  Component Value Date   CHOL 166 02/12/2021   CHOL 122 06/14/2020   CHOL 120 03/07/2020   Lab Results  Component Value Date   HDL 50.70 02/12/2021   HDL 45.80 06/14/2020   HDL 40.30 03/07/2020   Lab Results  Component Value Date   LDLCALC 77 02/12/2021   [place] 55 06/14/2020   LDLCALC 46 03/07/2020   Lab Results  Component Value Date   TRIG 194.0 (H) 02/12/2021   TRIG 106.0 06/14/2020   TRIG 172.0 (H) 03/07/2020   Lab Results  Component Value Date   CHOLHDL 3 02/12/2021   CHOLHDL 3 06/14/2020   CHOLHDL 3 03/07/2020   Lab Results  Component Value Date   LDLDIRECT 125.0 02/07/2020   LDLDIRECT 109.0 12/13/2019   LDLDIRECT 86.0 09/06/2019     Likely fatty liver: Liver functions show variable results   Lab Results  Component Value Date   ALT 27 02/12/2021       Examination:   BP 132/82    Pulse (!) 107    Ht 5' 6.5" (1.689 m)    Wt 204 lb (92.5 kg)    LMP 08/23/2006 (Approximate) Comment: tubal ligation   SpO2 97%    BMI 32.43 kg/m       Assessments   DIABETES with obesity:  See history of present illness for evaluation of  current management, blood sugar patterns and problems identified  Her A1c is stable at 6.2   She is on a regimen of basal insulin using 60 units of Levemir, Jardiance and Ozempic 1 mg  Recently has a few high readings in the morning or after dinner related to her diet with getting more sweets Otherwise she thinks she can try to be more active now for exercise and walk  No change in insulin unless morning sugars are consistently high May consider Mounjaro if A1c goes up  Hypothyroidism, post ablative: No  unusual fatigue lately TSH was higher last month  Continue 250 mg of LEVOXYL daily except extra 125 mcg once a week and recheck TSH in about a month  LIPIDS: She will try to watch her sweets and saturated fats We will check again in 1 month and if LDL is not below 70 will start Repatha instead of Zetia   Follow-up in 4 months      Patient Instructions  Check blood sugars on waking up 3-4 days a week  Also check blood sugars about 2 hours after meals and do this after different meals by rotation  Recommended blood sugar levels on waking up are 90-130 and about 2 hours after meal is 130-180  Please bring your blood sugar monitor to each visit, thank you  Lab in 1 month    .  Elayne Snare 03/06/2021, 3:07 PM   Note: This office note was prepared with Dragon voice recognition system technology. Any transcriptional errors that result from this process are unintentional.

## 2021-03-06 NOTE — Patient Instructions (Signed)
Check blood sugars on waking up 3-4 days a week  Also check blood sugars about 2 hours after meals and do this after different meals by rotation  Recommended blood sugar levels on waking up are 90-130 and about 2 hours after meal is 130-180  Please bring your blood sugar monitor to each visit, thank you  Lab in 1 month

## 2021-03-06 NOTE — Telephone Encounter (Signed)
Last OV was with [name], NP on 02/22/21 for UTI,  Gabapentin last filled on 03/27/20 #90 caps with 11 refills  Valtrex last filled on 10/02/20 #30 tabs with 5 refills  No future appts

## 2021-03-07 ENCOUNTER — Other Ambulatory Visit: Payer: Self-pay

## 2021-03-07 ENCOUNTER — Ambulatory Visit (INDEPENDENT_AMBULATORY_CARE_PROVIDER_SITE_OTHER): Payer: BC Managed Care – PPO | Admitting: Internal Medicine

## 2021-03-07 ENCOUNTER — Encounter: Payer: Self-pay | Admitting: Endocrinology

## 2021-03-07 ENCOUNTER — Encounter: Payer: Self-pay | Admitting: Internal Medicine

## 2021-03-07 VITALS — BP 138/74 | HR 89 | Ht 66.5 in | Wt 206.1 lb

## 2021-03-07 DIAGNOSIS — Z7902 Long term (current) use of antithrombotics/antiplatelets: Secondary | ICD-10-CM

## 2021-03-07 DIAGNOSIS — K219 Gastro-esophageal reflux disease without esophagitis: Secondary | ICD-10-CM

## 2021-03-07 DIAGNOSIS — E669 Obesity, unspecified: Secondary | ICD-10-CM

## 2021-03-07 DIAGNOSIS — K51919 Ulcerative colitis, unspecified with unspecified complications: Secondary | ICD-10-CM

## 2021-03-07 DIAGNOSIS — K5901 Slow transit constipation: Secondary | ICD-10-CM | POA: Diagnosis not present

## 2021-03-07 DIAGNOSIS — Z86010 Personal history of colonic polyps: Secondary | ICD-10-CM | POA: Diagnosis not present

## 2021-03-07 DIAGNOSIS — E1169 Type 2 diabetes mellitus with other specified complication: Secondary | ICD-10-CM

## 2021-03-07 DIAGNOSIS — E890 Postprocedural hypothyroidism: Secondary | ICD-10-CM

## 2021-03-07 MED ORDER — LEVOTHYROXINE SODIUM 125 MCG PO TABS: 125.0000 ug | ORAL_TABLET | Freq: Every day | ORAL | 2 refills | Status: DC

## 2021-03-07 MED ORDER — NA SULFATE-K SULFATE-MG SULF 17.5-3.13-1.6 GM/177ML PO SOLN: 1.0000 | Freq: Once | ORAL | 0 refills | Status: AC

## 2021-03-07 NOTE — Progress Notes (Signed)
HISTORY OF PRESENT ILLNESS:  Lindsay Fry is a 61 y.o. female with MULTIPLE SIGNIFICANT medical problems as listed below.  GI problems include chronic GERD, chronic constipation, morbid obesity, chronic ulcerative colitis, and a history of multiple adenomatous and sessile serrated polyps for which she is under close surveillance.  Significant general medical problems include insulin requiring diabetes mellitus, obesity, hypertension, arthritis, history of breast cancer, and coronary artery disease for which she is undergone coronary artery interventions.  She also has a history of bipolar disorder and OCD.  She was last seen in the office July 12, 2020 regarding the need for surveillance colonoscopy and chronic constipation.  Due to her recent coronary artery intervention and the need to be on dual antiplatelet therapy it was decided to delay her surveillance examination until the 1 year anniversary of her coronary artery stent placement.  She was seen by cardiology March 05, 2021.  I have reviewed that note.  She has been cleared for colonoscopy and is anticipating discontinuation of Plavix on April 06, 2021.  Excerpt from that visit as follows: "Pre-op clearance for colonoscopy.  Patient needing routine colonoscopy. She is stable from a cardiac perspective. According to the Revised Cardiac Risk Index she is a Class 2 risk, 6% 30 day risk of death, MI or cardiac arrest. METS>4. OK to indefinitely discontinue Plavix 04/06/21 as it will be a year post-stent. No further ischemic work-up indicated prior to procedure."  Patient tells me that she continues with chronic constipation.  She is using MiraLAX to regulate her bowels.  In terms of GERD, symptoms are currently controlled with pantoprazole 40 mg twice daily.  No dysphagia.  She had been successful in weight reduction.  However, since her last visit she has gained about 20 pounds.  Other complaints include bloating and nausea.  Her last colonoscopy  was performed May 14, 2018.  2 diminutive polyps at that time.  Exam was otherwise normal.  Random colon biopsies were unremarkable.  The small polyp was tubular adenoma.  Review of blood work from February 12, 2021 shows unremarkable comprehensive metabolic panel including liver tests.  CBC from November shows a hemoglobin of 14.7  REVIEW OF SYSTEMS:  All non-GI ROS negative unless otherwise stated in the HPI except for sinus infection, arthritis, back pain, cough, fatigue, night sweats, headaches, sleeping problems, shortness of breath  Past Medical History:  Diagnosis Date   ADD (attention deficit disorder) 11/19/2012   Allergic rhinitis, cause unspecified    Anxiety state, unspecified    Arthritis    Asymptomatic postmenopausal status (age-related) (natural)    Atypical hyperplasia of left breast 2000   Atypical mole 03/16/2018   R paraspinal mid upper back, excision   Atypical mole 02/17/2018   R spinal upper back, moderate   Atypical mole 08/11/2017   L sternum, excision   Atypical mole 05/27/2017   L post neck, moderate   Benign neoplasm of other and unspecified site of the digestive system    Bipolar disorder ([place])    Breast cancer ([place])    Left Breast 52mm invasive CA left uiq, dcis left uoq and a lot ADH, ALH in both breasts.   CAD (coronary artery disease)    a. 08/2014 NSTEMI/Cath: LM nl, LAD 50p, D1/D2 min irregs, LCX nl, OM1 40, LPDA nl, RI 99ost (2.79mm vessel->med Rx), RCA nl, AM 60, nl EF; b. 11/2016 MV: EF 74%, no ischemia (performed 2/2 dyspnea & new inf TWI).   Carcinoma of left breast ([place])  06/02/2014   Cataract    Chronic diastolic CHF (congestive heart failure) ([place])    a. 08/2014 Echo: EF 60-65%, Gr 1 DD, nl LA.   Clotting disorder ([place])    on blood thiner, Plavix   Cyst    on Achilles tendon   Depression    Diabetes mellitus ([place])    frank   Dysuria    Edema    Family history of malignant neoplasm of gastrointestinal tract    Fibromyalgia    GERD  (gastroesophageal reflux disease)    Glaucoma    pre glaucoma, 05/14/2018 pt. denies glaucoma   Headache    migraines   Hiatal hernia    History of alcoholism ([place])    History of migraines    History of ovarian cyst    Hx of adenomatous colonic polyps    Insomnia, unspecified    Myocardial infarction ([place]) 09-04-14   OCD (obsessive compulsive disorder)    Osteopenia 09/25/2016   Femoral neck T -1.1  9/18   Other screening mammogram    Pure hypercholesterolemia    Rosacea    Subacute confusional state 11/19/2012   Tubular adenoma of colon 01/10/11   Ulcerative colitis, unspecified    Unspecified asthma(493.90)    Unspecified essential hypertension    Unspecified hypothyroidism    Unspecified vitamin D deficiency    Vertigo     Past Surgical History:  Procedure Laterality Date   APPENDECTOMY     AXILLARY SENTINEL NODE BIOPSY Left 05/23/2014   Procedure: AXILLARY SENTINEL NODE BIOPSY;  Surgeon: Christene Lye, MD;  Location: [facility];  Service: General;  Laterality: Left;   BREAST BIOPSY  08/1998   on tamoxifen, atypical hyperplasia   BREAST BIOPSY Left 04/2014   BREAST SURGERY Left 08/1998   lumpectomy/ Dr Sharlet Salina   BREAST SURGERY Bilateral 05/23/2014   Mastectomy   BUNIONECTOMY Right    CARDIAC CATHETERIZATION N/A 09/05/2014   Procedure: Left Heart Cath and Coronary Angiography;  Surgeon: Wellington Hampshire, MD;  Location: [facility];  Service: Cardiovascular;  Laterality: N/A;   CARDIAC CATHETERIZATION  09/05/2014   CATARACT EXTRACTION W/ INTRAOCULAR LENS IMPLANT Bilateral    CESAREAN SECTION  1996/1997   placenta previa, gest DM, pre-eclampsia   CHOLECYSTECTOMY  1997   adhesions also   COLONOSCOPY  01/2001   Ulcerative colitis   COLONOSCOPY  04/2004   UC, polyp   COLONOSCOPY  12/2006   UC, no polyps   CORONARY STENT INTERVENTION N/A 04/06/2020   Procedure: CORONARY STENT INTERVENTION;  Surgeon: Nelva Bush, MD;  Location: [facility];   Service: Cardiovascular;  Laterality: N/A;   DEXA  04/1999 and 2010   normal   ESOPHAGOGASTRODUODENOSCOPY  09/2001   polyp   exercise stress test  11/2003   negative   FEMUR FRACTURE SURGERY Left    LEFT HEART CATH AND CORONARY ANGIOGRAPHY N/A 04/06/2020   Procedure: LEFT HEART CATH AND CORONARY ANGIOGRAPHY;  Surgeon: Minna Merritts, MD;  Location: [facility];  Service: Cardiovascular;  Laterality: N/A;   MASTECTOMY Bilateral    MECKEL DIVERTICULUM EXCISION  1999   NASAL SINUS SURGERY  05/1997   nuclear stress test  06/2007   negative   precancerous mole removed     RIGHT AND LEFT HEART CATH     with 1 stent placed in March 2022   [place] BIOPSY Bilateral 05/23/2014   Procedure: Bilateral simple mastectomy, left sentinel node  biopsy ;  Surgeon: Christene Lye, MD;  Location: [facility];  Service: General;  Laterality: Bilateral;   Sleep study  11/2007   no apnea, but did snore (done by [facility])   [place] EXTRACTION  1980's    Social History [employer]  reports that she quit smoking about 28 years ago. Her smoking use included cigarettes. She has a 1.25 pack-year smoking history. She has never used smokeless tobacco. She reports that she does not drink alcohol and does not use drugs.  family history includes Alzheimer's disease in her father; Breast cancer in an other family member; Colon cancer in her father; Colon polyps in her father; Coronary artery disease in her father, mother, and another family member; Crohn's disease in her mother; Dementia in her father; Diabetes in an other family member; Hypertension in her mother; Osteoporosis in her mother; Stroke in her cousin.  Allergies  Allergen Reactions   Ephedrine Other (See Comments)    Pt becomes hyper   Aspirin     REACTION: aggrivates colitis Tolerates 81 mg but unable to take doses >81 mg   Crestor  [Rosuvastatin]     REACTION: increased lfts   Erythromycin     REACTION: GI upset   Nsaids     REACTION: aggrivate colitis   Oxycodone Other (See Comments)    Panic Attack       PHYSICAL EXAMINATION: Vital signs: BP 138/74    Pulse 89    Ht 5' 6.5" (1.689 m)    Wt 206 lb 2 oz (93.5 kg)    LMP 08/23/2006 (Approximate) Comment: tubal ligation   BMI 32.77 kg/m   Constitutional: generally well-appearing, no acute distress Psychiatric: alert and oriented x3, cooperative Eyes: extraocular movements intact, anicteric, conjunctiva pink Mouth: oral pharynx moist, no lesions Neck: supple no lymphadenopathy Cardiovascular: heart regular rate and rhythm, no murmur Lungs: clear to auscultation bilaterally Abdomen: soft, obese, nontender, nondistended, no obvious ascites, no peritoneal signs, normal bowel sounds, no organomegaly Rectal: Deferred until colonoscopy Extremities: no clubbing, cyanosis, or significant lower extremity edema bilaterally Skin: no lesions on visible extremities Neuro: No focal deficits.  Cranial nerves intact  ASSESSMENT:  1.  History of ulcerative colitis, adenomatous colon polyps, sessile serrated polyps.  Last colonoscopy April 2020.  Due for surveillance 2.  Chronic constipation.  Managed with laxatives of choice 3.  GERD.  Controlled on twice daily PPI 4.  Obesity 5.  Multiple medical problems including coronary artery disease with prior coronary artery intervention on aspirin and Plavix and insulin requiring diabetes mellitus   PLAN:  1.  Surveillance colonoscopy.  The patient is HIGH RISK given her comorbidities and body habitus.  Patient will be off of her Plavix per her cardiologist and on aspirin after April 06, 2021.  She request an afternoon appointment.The nature of the procedure, as well as the risks, benefits, and alternatives were carefully and thoroughly reviewed with the patient. Ample time for discussion and questions allowed. The patient  understood, was satisfied, and agreed to proceed. 2.  Take two thirds of evening insulin the day prior to the procedure 3.  Extensive prep with 2 days of clear liquids, MiraLAX morning prior to the exam to be followed by standard split prep later that day. 4.  Reflux precautions 5.  Weight loss 6.  Continue twice daily PPI (currently the lowest dose to control symptoms) A total time of 35 minutes  was spent preparing to see the patient, reviewing tests and records, obtaining comprehensive history, performing comprehensive physical examination, counseling the patient regarding the above listed issues, and ordering her endoscopic procedure.  Finally, documenting clinical information in the health record

## 2021-03-07 NOTE — Patient Instructions (Signed)
If you are age 63 or older, your body mass index should be between 23-30. Your Body mass index is 32.77 kg/m. If this is out of the aforementioned range listed, please consider follow up with your Primary Care Provider.  If you are age 26 or younger, your body mass index should be between 19-25. Your Body mass index is 32.77 kg/m. If this is out of the aformentioned range listed, please consider follow up with your Primary Care Provider.   ________________________________________________________  The [facility] providers would like to encourage you to use [facility] to communicate with providers for non-urgent requests or questions.  Due to long hold times on the telephone, sending your provider a message by [facility] may be a faster and more efficient way to get a response.  Please allow 48 business hours for a response.  Please remember that this is for non-urgent requests.  _______________________________________________________  Lindsay Fry have been scheduled for a colonoscopy. Please follow written instructions given to you at your visit today.  Please pick up your prep supplies at the pharmacy within the next 1-3 days. If you use inhalers (even only as needed), please bring them with you on the day of your procedure.

## 2021-03-12 DIAGNOSIS — M25562 Pain in left knee: Secondary | ICD-10-CM | POA: Diagnosis not present

## 2021-03-12 DIAGNOSIS — M6281 Muscle weakness (generalized): Secondary | ICD-10-CM | POA: Diagnosis not present

## 2021-03-12 DIAGNOSIS — R2681 Unsteadiness on feet: Secondary | ICD-10-CM | POA: Diagnosis not present

## 2021-03-13 DIAGNOSIS — F411 Generalized anxiety disorder: Secondary | ICD-10-CM | POA: Diagnosis not present

## 2021-03-13 DIAGNOSIS — F3112 Bipolar disorder, current episode manic without psychotic features, moderate: Secondary | ICD-10-CM | POA: Diagnosis not present

## 2021-03-15 DIAGNOSIS — M6281 Muscle weakness (generalized): Secondary | ICD-10-CM | POA: Diagnosis not present

## 2021-03-15 DIAGNOSIS — M25562 Pain in left knee: Secondary | ICD-10-CM | POA: Diagnosis not present

## 2021-03-15 DIAGNOSIS — R2681 Unsteadiness on feet: Secondary | ICD-10-CM | POA: Diagnosis not present

## 2021-03-18 ENCOUNTER — Encounter: Payer: Self-pay | Admitting: Internal Medicine

## 2021-03-19 DIAGNOSIS — M25562 Pain in left knee: Secondary | ICD-10-CM | POA: Diagnosis not present

## 2021-03-19 DIAGNOSIS — R2681 Unsteadiness on feet: Secondary | ICD-10-CM | POA: Diagnosis not present

## 2021-03-19 DIAGNOSIS — M6281 Muscle weakness (generalized): Secondary | ICD-10-CM | POA: Diagnosis not present

## 2021-03-20 DIAGNOSIS — F3112 Bipolar disorder, current episode manic without psychotic features, moderate: Secondary | ICD-10-CM | POA: Diagnosis not present

## 2021-03-20 DIAGNOSIS — F411 Generalized anxiety disorder: Secondary | ICD-10-CM | POA: Diagnosis not present

## 2021-03-20 DIAGNOSIS — F901 Attention-deficit hyperactivity disorder, predominantly hyperactive type: Secondary | ICD-10-CM | POA: Diagnosis not present

## 2021-03-21 DIAGNOSIS — M25562 Pain in left knee: Secondary | ICD-10-CM | POA: Diagnosis not present

## 2021-03-21 DIAGNOSIS — R2681 Unsteadiness on feet: Secondary | ICD-10-CM | POA: Diagnosis not present

## 2021-03-21 DIAGNOSIS — M6281 Muscle weakness (generalized): Secondary | ICD-10-CM | POA: Diagnosis not present

## 2021-03-26 ENCOUNTER — Other Ambulatory Visit: Payer: Self-pay

## 2021-03-26 ENCOUNTER — Encounter: Payer: Self-pay | Admitting: Internal Medicine

## 2021-03-26 DIAGNOSIS — M25562 Pain in left knee: Secondary | ICD-10-CM | POA: Diagnosis not present

## 2021-03-26 DIAGNOSIS — R110 Nausea: Secondary | ICD-10-CM

## 2021-03-26 DIAGNOSIS — R2681 Unsteadiness on feet: Secondary | ICD-10-CM | POA: Diagnosis not present

## 2021-03-26 DIAGNOSIS — M6281 Muscle weakness (generalized): Secondary | ICD-10-CM | POA: Diagnosis not present

## 2021-03-26 MED ORDER — ONDANSETRON 4 MG PO TBDP: ORAL_TABLET | ORAL | 0 refills | Status: DC

## 2021-03-29 DIAGNOSIS — M6281 Muscle weakness (generalized): Secondary | ICD-10-CM | POA: Diagnosis not present

## 2021-03-29 DIAGNOSIS — M25562 Pain in left knee: Secondary | ICD-10-CM | POA: Diagnosis not present

## 2021-03-29 DIAGNOSIS — R2681 Unsteadiness on feet: Secondary | ICD-10-CM | POA: Diagnosis not present

## 2021-04-03 ENCOUNTER — Encounter: Payer: Self-pay | Admitting: Family Medicine

## 2021-04-03 ENCOUNTER — Other Ambulatory Visit: Payer: Self-pay

## 2021-04-03 ENCOUNTER — Ambulatory Visit: Payer: BC Managed Care – PPO | Admitting: Family Medicine

## 2021-04-03 VITALS — BP 118/82 | HR 86 | Temp 98.4°F | Resp 12 | Ht 66.5 in | Wt 208.4 lb

## 2021-04-03 DIAGNOSIS — M545 Low back pain, unspecified: Secondary | ICD-10-CM | POA: Diagnosis not present

## 2021-04-03 DIAGNOSIS — Z794 Long term (current) use of insulin: Secondary | ICD-10-CM

## 2021-04-03 DIAGNOSIS — R1031 Right lower quadrant pain: Secondary | ICD-10-CM | POA: Insufficient documentation

## 2021-04-03 DIAGNOSIS — E1165 Type 2 diabetes mellitus with hyperglycemia: Secondary | ICD-10-CM

## 2021-04-03 DIAGNOSIS — K5100 Ulcerative (chronic) pancolitis without complications: Secondary | ICD-10-CM | POA: Diagnosis not present

## 2021-04-03 DIAGNOSIS — K5909 Other constipation: Secondary | ICD-10-CM

## 2021-04-03 LAB — POCT URINALYSIS DIPSTICK
Bilirubin, UA: NEGATIVE
Blood, UA: NEGATIVE
Glucose, UA: POSITIVE — AB
Ketones, UA: NEGATIVE
Leukocytes, UA: NEGATIVE
Nitrite, UA: NEGATIVE
Protein, UA: NEGATIVE
Spec Grav, UA: 1.015 (ref 1.010–1.025)
Urobilinogen, UA: NEGATIVE E.U./dL — AB
pH, UA: 6 (ref 5.0–8.0)

## 2021-04-03 LAB — CBC WITH DIFFERENTIAL/PLATELET
Basophils Absolute: 0.1 10*3/uL (ref 0.0–0.1)
Basophils Relative: 0.9 % (ref 0.0–3.0)
Eosinophils Absolute: 0.3 10*3/uL (ref 0.0–0.7)
Eosinophils Relative: 5.3 % — ABNORMAL HIGH (ref 0.0–5.0)
HCT: 45 % (ref 36.0–46.0)
Hemoglobin: 14.9 g/dL (ref 12.0–15.0)
Lymphocytes Relative: 30.5 % (ref 12.0–46.0)
Lymphs Abs: 1.9 10*3/uL (ref 0.7–4.0)
MCHC: 33.1 g/dL (ref 30.0–36.0)
MCV: 93.9 fl (ref 78.0–100.0)
Monocytes Absolute: 0.7 10*3/uL (ref 0.1–1.0)
Monocytes Relative: 11.5 % (ref 3.0–12.0)
Neutro Abs: 3.2 10*3/uL (ref 1.4–7.7)
Neutrophils Relative %: 51.8 % (ref 43.0–77.0)
Platelets: 283 10*3/uL (ref 150.0–400.0)
RBC: 4.79 Mil/uL (ref 3.87–5.11)
RDW: 14.2 % (ref 11.5–15.5)
WBC: 6.1 10*3/uL (ref 4.0–10.5)

## 2021-04-03 MED ORDER — PREDNISONE 20 MG PO TABS: ORAL_TABLET | ORAL | 0 refills | Status: DC

## 2021-04-03 NOTE — Assessment & Plan Note (Signed)
Acute on chronic ? ?Most likely due to musculoskeletal pain from arthritis or nerve attack irritation in the back.  Given right lower quadrant pain other etiologies of referred pain were considered as well.  See other discussion. ? ?Treat with prednisone taper given nonsteroidal anti-inflammatories contraindicated in ulcerative colitis.  Treat with low back stretches ( info given) as well as heat.  Follow-up if not improving in 2 to 4 weeks. ?

## 2021-04-03 NOTE — Patient Instructions (Addendum)
Start prednisone taper  in AM for low back pain given NSAIDS contraindicated in ulcerative colitis. ? Follow blood sugars while and and call if pain not well controlled or blood sugar > 300 ? Use tylenol extra strength for pain. ? Continue constipation treatments. ? Please stop at the lab to have labs drawn. ? ? Heat on low back and start home physical therapy. ?

## 2021-04-03 NOTE — Progress Notes (Signed)
? ? Patient ID: Lindsay Fry, female    DOB: 1960-08-04, 61 y.o.   MRN: [id number] ? ?This visit was conducted in person. ? ?BP 118/82 (BP Location: Left Arm, Patient Position: Sitting, Cuff Size: Large)   Pulse 86   Temp 98.4 ?F (36.9 ?C) (Oral)   Resp 12   Ht 5' 6.5" (1.689 m)   Wt 208 lb 6.4 oz (94.5 kg)   LMP 08/23/2006 (Approximate) Comment: tubal ligation  SpO2 98%   BMI 33.13 kg/m?   ? ?CC:  ?Chief Complaint  ?Patient presents with  ? Acute Visit  ?  Right lower back pain, this morning has radiated to R side. Took 2 aleves this morning which helped some. Pain is positional and are sharp onset 1 wk ago. Hx of lumbar issues states this time is worse.   ? ? ?Subjective:  ? ?HPI: ?Lindsay Fry is a 61 y.o. female presenting on 04/03/2021 for Acute Visit (Right lower back pain, this morning has radiated to R side. Took 2 aleves this morning which helped some. Pain is positional and are sharp onset 1 wk ago. Hx of lumbar issues states this time is worse. ) ? ?PCP Dr. Glori Bickers ? ? New onset right lower back pain...  did a lot of walking last week. Pain started on 3/17. ? Radiates to right buttock and now today NEW pain going around left lower quadrant abdomen. ? Pain increase when leaning forward, going sitting to standing. ? Worsening in last 24 hours. 8-9/10 ? Sharp stabbing pain. ? No pain in leg. ? No new numbness, no weakness. ? No new incontinence. No fever. No new rash, no new  hypersensitivity. No blood in urine. ? Always constipated, severely on multiple medications for this. No dysuria, no blood in urine ? ? Using aleve 400 mg, has helped today, using salonpas.Using heat... helping some ?   ? Hx of past  chronic back pain, no past surgeries or treatments. ? Ovary issue in past. ?S/P cholecystectomy and appendectomy... has uterus and ovaries. ? ?Ulcerative Colitis: Under care of GI, Has colonoscopy upcoming next month, previously table with lialda currently ? ?Lab Results  ?Component Value Date   ? HGBA1C 6.2 02/12/2021  ? ? ? ?Relevant past medical, surgical, family and social history reviewed and updated as indicated. Interim medical history since our last visit reviewed. ?Allergies and medications reviewed and updated. ?Outpatient Medications Prior to Visit  ?Medication Sig Dispense Refill  ? acetaminophen (TYLENOL) 650 MG CR tablet Take 1,300 mg by mouth 2 (two) times daily.    ? amantadine (SYMMETREL) 100 MG capsule Take 100 mg by mouth 2 (two) times daily.    ? Ascorbic Acid (VITAMIN C PO) Take 1,000 mg by mouth daily.    ? aspirin 81 MG chewable tablet Chew 1 tablet (81 mg total) by mouth daily. 90 tablet 3  ? atorvastatin (LIPITOR) 80 MG tablet TAKE 1 TABLET BY MOUTH ONCE EVERY EVENING 90 tablet 1  ? B Complex Vitamins (VITAMIN B COMPLEX PO) Take 1 tablet by mouth every other day.    ? BAYER MICROLET LANCETS lancets USE AS DIRECTED TO CHECK BLOOD SUGAR TWICE DAILY 200 each 2  ? Blood Glucose Monitoring Suppl (CONTOUR NEXT MONITOR) w/Device KIT 1 each by Does not apply route in the morning and at bedtime. Use Contour Next meter to check blood sugar twice daily. 1 kit 0  ? Calcium Carb-Cholecalciferol (CALTRATE 600+D3 PO) Take 1 tablet by mouth daily. 300 mg  Vitamin D3    ? Co-Enzyme Q-10 100 MG CAPS Take 100 mg by mouth daily.    ? dextroamphetamine (DEXEDRINE SPANSULE) 15 MG 24 hr capsule Take 15 mg by mouth as needed.    ? diazepam (VALIUM) 10 MG tablet Take 5-10 mg by mouth daily as needed for anxiety.    ? diphenhydrAMINE (BENADRYL) 25 MG tablet Take 50 mg by mouth at bedtime.    ? docusate sodium (COLACE) 100 MG capsule Take 100-300 mg by mouth daily as needed for mild constipation or moderate constipation.    ? ezetimibe (ZETIA) 10 MG tablet TAKE 1 TABLET(10 MG) BY MOUTH DAILY 90 tablet 3  ? famotidine (PEPCID) 40 MG tablet Take 40 mg by mouth 2 (two) times daily. 30 min. Before meal and at bedtime    ? fluticasone (FLONASE) 50 MCG/ACT nasal spray Place 1 spray into both nostrils daily. 16 g 6   ? gabapentin (NEURONTIN) 300 MG capsule TAKE 1 CAPSULE BY MOUTH 3 TIMES DAILY 90 capsule 11  ? glucose blood (CONTOUR NEXT TEST) test strip USE AS DIRECTED 100 strip 2  ? insulin detemir (LEVEMIR FLEXTOUCH) 100 UNIT/ML FlexPen Inject 60 Units into the skin every evening. 15 mL 5  ? Insulin Pen Needle (B-D UF III MINI PEN NEEDLES) 31G X 5 MM MISC USE FOR INJECTIONS TWICE DAILY 100 each 12  ? JARDIANCE 25 MG TABS tablet TAKE 1 TABLET(25 MG) BY MOUTH DAILY 30 tablet 3  ? ketoconazole (NIZORAL) 2 % cream Apply to affected areas one to two times daily as needed. 30 g 2  ? lamoTRIgine (LAMICTAL) 200 MG tablet Take 400 mg by mouth daily.    ? latanoprost (XALATAN) 0.005 % ophthalmic solution Place 1 drop into both eyes 2 (two) times a week.     ? levothyroxine (SYNTHROID) 125 MCG tablet Take 1 tablet (125 mcg total) by mouth daily before breakfast. Take 2 tablets PO 6 days per week and take 3 tablets PO on day #7 (Mondays) 34 tablet 2  ? loperamide (IMODIUM A-D) 2 MG tablet Take 2 mg by mouth daily as needed for diarrhea or loose stools.    ? Melatonin 10 MG CAPS Take 10 mg by mouth at bedtime.    ? mesalamine (LIALDA) 1.2 g EC tablet TAKE 2 TABLETS BY MOUTH DAILY 180 tablet 1  ? metoprolol succinate (TOPROL-XL) 25 MG 24 hr tablet TAKE ONE-HALF TABLET BY MOUTH AS NEEDED AT BEDTIME FOR SYSTOLIC BLOOD PRESSURE GREATER THAN 100 45 tablet 3  ? Multiple Vitamin (MULTIVITAMIN) tablet Take 1 tablet by mouth daily. Woman 50 plus    ? nitroGLYCERIN (NITROSTAT) 0.4 MG SL tablet Place 1 tablet (0.4 mg total) under the tongue every 5 (five) minutes as needed for chest pain. 25 tablet 4  ? olmesartan (BENICAR) 5 MG tablet Take 1 tablet (5 mg total) by mouth daily. 90 tablet 3  ? ondansetron (ZOFRAN-ODT) 4 MG disintegrating tablet DISSOLVE 1 TABLET(4 MG) ON THE TONGUE EVERY 6 HOURS AS NEEDED FOR NAUSEA OR VOMITING 30 tablet 0  ? pantoprazole (PROTONIX) 40 MG tablet TAKE 1 TABLET(40 MG) BY MOUTH TWICE DAILY 180 tablet 1  ? polyethylene  glycol (MIRALAX / GLYCOLAX) 17 g packet Take 17 g by mouth 3 (three) times daily as needed for moderate constipation, mild constipation or severe constipation. In water or beverage    ? Probiotic Product (CULTURELLE PROBIOTICS PO) Take 1 capsule by mouth daily. For woman    ? QUEtiapine (SEROQUEL XR)  400 MG 24 hr tablet Take 800 mg by mouth at bedtime.    ? Semaglutide, 1 MG/DOSE, 4 MG/3ML SOPN Inject 1 mg into the skin once a week. Wednesdays    ? simethicone (MYLICON) 80 MG chewable tablet Chew 80 mg by mouth 4 (four) times daily.    ? Tavaborole (KERYDIN) 5 % SOLN Apply 1 application topically at bedtime. Apply to affected area of left great toenail 10 mL 6  ? valACYclovir (VALTREX) 1000 MG tablet TAKE 1 TABLET BY MOUTH ONCE DAILY 30 tablet 5  ? VRAYLAR capsule Take 3 mg by mouth at bedtime.    ? zaleplon (SONATA) 10 MG capsule Take 10-20 mg by mouth at bedtime.    ? clopidogrel (PLAVIX) 75 MG tablet Take 1 tablet (75 mg total) by mouth daily. (Patient not taking: Reported on 04/03/2021) 90 tablet 3  ? ?No facility-administered medications prior to visit.  ?  ? ?Per HPI unless specifically indicated in ROS section below ?Review of Systems  ?Constitutional:  Negative for fatigue and fever.  ?HENT:  Negative for congestion.   ?Eyes:  Negative for pain.  ?Respiratory:  Negative for cough and shortness of breath.   ?Cardiovascular:  Negative for chest pain, palpitations and leg swelling.  ?Gastrointestinal:  Negative for abdominal pain.  ?Genitourinary:  Negative for dysuria and vaginal bleeding.  ?Musculoskeletal:  Negative for back pain.  ?Neurological:  Negative for syncope, light-headedness and headaches.  ?Psychiatric/Behavioral:  Negative for dysphoric mood.   ?Objective:  ?BP 118/82 (BP Location: Left Arm, Patient Position: Sitting, Cuff Size: Large)   Pulse 86   Temp 98.4 ?F (36.9 ?C) (Oral)   Resp 12   Ht 5' 6.5" (1.689 m)   Wt 208 lb 6.4 oz (94.5 kg)   LMP 08/23/2006 (Approximate) Comment: tubal  ligation  SpO2 98%   BMI 33.13 kg/m?   ?Wt Readings from Last 3 Encounters:  ?04/03/21 208 lb 6.4 oz (94.5 kg)  ?03/07/21 206 lb 2 oz (93.5 kg)  ?03/06/21 204 lb (92.5 kg)  ?  ?  ?Physical Exam ?Constitutional:   ?   Gen

## 2021-04-03 NOTE — Assessment & Plan Note (Signed)
Acute ? ?Given pain did not start until this morning, it may be due to irritation of ulcerative colitis from the nonsteroidal anti-inflammatory she is started using in the last 24 hours.  I have recommended she stop and use Tylenol extra strength only for pain. ?Differential diagnosis includes ulcerative colitis pain, referred pain from back,, chronic constipation, nephrolithiasis urinary infection, or ovarian pathology. ?Urinalysis was clear except for glucose.  We will check a CBC to check for signs of infection. ?If pain not improving as expected will consider imaging. ?

## 2021-04-04 ENCOUNTER — Encounter (HOSPITAL_COMMUNITY): Payer: Self-pay | Admitting: Emergency Medicine

## 2021-04-04 ENCOUNTER — Encounter: Payer: Self-pay | Admitting: Family Medicine

## 2021-04-04 ENCOUNTER — Other Ambulatory Visit: Payer: Self-pay

## 2021-04-04 ENCOUNTER — Emergency Department (HOSPITAL_COMMUNITY)
Admission: EM | Admit: 2021-04-04 | Discharge: 2021-04-04 | Disposition: A | Discharge status: Home / Self Care | Payer: BC Managed Care – PPO | Attending: Emergency Medicine | Admitting: Emergency Medicine

## 2021-04-04 ENCOUNTER — Encounter: Payer: Self-pay | Admitting: Endocrinology

## 2021-04-04 ENCOUNTER — Emergency Department (HOSPITAL_COMMUNITY): Payer: BC Managed Care – PPO

## 2021-04-04 DIAGNOSIS — Z853 Personal history of malignant neoplasm of breast: Secondary | ICD-10-CM | POA: Diagnosis not present

## 2021-04-04 DIAGNOSIS — M545 Low back pain, unspecified: Secondary | ICD-10-CM | POA: Insufficient documentation

## 2021-04-04 DIAGNOSIS — I5032 Chronic diastolic (congestive) heart failure: Secondary | ICD-10-CM | POA: Diagnosis not present

## 2021-04-04 DIAGNOSIS — Z794 Long term (current) use of insulin: Secondary | ICD-10-CM | POA: Diagnosis not present

## 2021-04-04 DIAGNOSIS — R1031 Right lower quadrant pain: Secondary | ICD-10-CM | POA: Insufficient documentation

## 2021-04-04 DIAGNOSIS — Z79899 Other long term (current) drug therapy: Secondary | ICD-10-CM | POA: Insufficient documentation

## 2021-04-04 DIAGNOSIS — Z7984 Long term (current) use of oral hypoglycemic drugs: Secondary | ICD-10-CM | POA: Diagnosis not present

## 2021-04-04 DIAGNOSIS — E1165 Type 2 diabetes mellitus with hyperglycemia: Secondary | ICD-10-CM | POA: Insufficient documentation

## 2021-04-04 DIAGNOSIS — K3189 Other diseases of stomach and duodenum: Secondary | ICD-10-CM | POA: Diagnosis not present

## 2021-04-04 DIAGNOSIS — I2510 Atherosclerotic heart disease of native coronary artery without angina pectoris: Secondary | ICD-10-CM | POA: Insufficient documentation

## 2021-04-04 DIAGNOSIS — I110 Hypertensive heart disease with heart failure: Secondary | ICD-10-CM | POA: Insufficient documentation

## 2021-04-04 DIAGNOSIS — E1169 Type 2 diabetes mellitus with other specified complication: Secondary | ICD-10-CM

## 2021-04-04 DIAGNOSIS — Z7982 Long term (current) use of aspirin: Secondary | ICD-10-CM | POA: Diagnosis not present

## 2021-04-04 DIAGNOSIS — M5459 Other low back pain: Secondary | ICD-10-CM | POA: Diagnosis not present

## 2021-04-04 DIAGNOSIS — D72829 Elevated white blood cell count, unspecified: Secondary | ICD-10-CM | POA: Diagnosis not present

## 2021-04-04 DIAGNOSIS — Z9889 Other specified postprocedural states: Secondary | ICD-10-CM | POA: Diagnosis not present

## 2021-04-04 DIAGNOSIS — N200 Calculus of kidney: Secondary | ICD-10-CM | POA: Diagnosis not present

## 2021-04-04 DIAGNOSIS — I700 Atherosclerosis of aorta: Secondary | ICD-10-CM | POA: Diagnosis not present

## 2021-04-04 LAB — COMPREHENSIVE METABOLIC PANEL
ALT: 38 U/L (ref 0–44)
AST: 29 U/L (ref 15–41)
Albumin: 4.7 g/dL (ref 3.5–5.0)
Alkaline Phosphatase: 85 U/L (ref 38–126)
Anion gap: 11 (ref 5–15)
BUN: 12 mg/dL (ref 8–23)
CO2: 22 mmol/L (ref 22–32)
Calcium: 9.7 mg/dL (ref 8.9–10.3)
Chloride: 108 mmol/L (ref 98–111)
Creatinine, Ser: 0.64 mg/dL (ref 0.44–1.00)
GFR, Estimated: >60 mL/min (ref >60)
Glucose, Bld: 197 mg/dL — ABNORMAL HIGH (ref 70–99)
Potassium: 4.1 mmol/L (ref 3.5–5.1)
Sodium: 141 mmol/L (ref 135–145)
Total Bilirubin: 0.3 mg/dL (ref 0.3–1.2)
Total Protein: 7.5 g/dL (ref 6.5–8.1)

## 2021-04-04 LAB — URINALYSIS, ROUTINE W REFLEX MICROSCOPIC
Bacteria, UA: NONE SEEN
Bilirubin Urine: NEGATIVE
Glucose, UA: >=500 mg/dL — AB
Hgb urine dipstick: NEGATIVE
Ketones, ur: NEGATIVE mg/dL
Leukocytes,Ua: NEGATIVE
Nitrite: NEGATIVE
Protein, ur: NEGATIVE mg/dL
Specific Gravity, Urine: 1.033 — ABNORMAL HIGH (ref 1.005–1.030)
pH: 5 (ref 5.0–8.0)

## 2021-04-04 LAB — CBC WITH DIFFERENTIAL/PLATELET
Abs Immature Granulocytes: 0.03 10*3/uL (ref 0.00–0.07)
Basophils Absolute: 0.1 10*3/uL (ref 0.0–0.1)
Basophils Relative: 1 %
Eosinophils Absolute: 0 10*3/uL (ref 0.0–0.5)
Eosinophils Relative: 0 %
HCT: 52.3 % — ABNORMAL HIGH (ref 36.0–46.0)
Hemoglobin: 16.9 g/dL — ABNORMAL HIGH (ref 12.0–15.0)
Immature Granulocytes: 0 %
Lymphocytes Relative: 9 %
Lymphs Abs: 0.9 10*3/uL (ref 0.7–4.0)
MCH: 31.1 pg (ref 26.0–34.0)
MCHC: 32.3 g/dL (ref 30.0–36.0)
MCV: 96.3 fL (ref 80.0–100.0)
Monocytes Absolute: 0.3 10*3/uL (ref 0.1–1.0)
Monocytes Relative: 3 %
Neutro Abs: 9.6 10*3/uL — ABNORMAL HIGH (ref 1.7–7.7)
Neutrophils Relative %: 87 %
Platelets: 372 10*3/uL (ref 150–400)
RBC: 5.43 MIL/uL — ABNORMAL HIGH (ref 3.87–5.11)
RDW: 13.2 % (ref 11.5–15.5)
WBC: 10.9 10*3/uL — ABNORMAL HIGH (ref 4.0–10.5)
nRBC: 0 % (ref 0.0–0.2)

## 2021-04-04 MED ORDER — CYCLOBENZAPRINE HCL 5 MG PO TABS: 5.0000 mg | ORAL_TABLET | Freq: Three times a day (TID) | ORAL | 0 refills | Status: DC | PRN

## 2021-04-04 MED ORDER — NAPROXEN 500 MG PO TABS
250.0000 mg | ORAL_TABLET | Freq: Once | ORAL | Status: AC
  Administered 2021-04-04: 250 mg via ORAL
  Filled 2021-04-04: qty 1

## 2021-04-04 MED ORDER — CONTOUR NEXT TEST VI STRP: ORAL_STRIP | 2 refills | Status: DC

## 2021-04-04 MED ORDER — NOVOLOG FLEXPEN 100 UNIT/ML ~~LOC~~ SOPN: PEN_INJECTOR | SUBCUTANEOUS | 1 refills | Status: DC

## 2021-04-04 MED ORDER — CYCLOBENZAPRINE HCL 10 MG PO TABS
5.0000 mg | ORAL_TABLET | Freq: Once | ORAL | Status: AC
Start: 2021-04-04 — End: 2021-04-04
  Administered 2021-04-04: 5 mg via ORAL
  Filled 2021-04-04: qty 1

## 2021-04-04 MED ORDER — CONTOUR NEXT MONITOR W/DEVICE KIT: 1.0000 | PACK | Freq: Two times a day (BID) | 0 refills | Status: DC

## 2021-04-04 NOTE — Telephone Encounter (Signed)
Agree given pain is worsening.. pt needs to be seen for further imaging ASAP. ER is appropriate with 10/10 pain. ?

## 2021-04-04 NOTE — ED Triage Notes (Signed)
Pt reports lower right sided back pain since Friday. Pt reports hx of chronic back pain but reorts this is worse.  ?

## 2021-04-04 NOTE — Discharge Instructions (Signed)
You came to the emergency department today to be evaluated for your back pain.  Your physical exam, lab results, and CT imaging were reassuring.  Your pain may be due to a muscle strain or sciatica.  Please continue to take the prednisone medication given as prescribed.  I have given you a prescription for Flexeril, please take this medication as prescribed.  Please follow-up with your primary care provider for further assessment if your pain does not improve. ? ?Today you received medications that may make you sleepy or impair your ability to make decisions.  For the next 24 hours please do not drive, operate heavy machinery, care for a small child with out another adult present, or perform any activities that may cause harm to you or someone else if you were to fall asleep or be impaired.  ? ?You are being prescribed a medication which may make you sleepy. Please follow up of listed precautions for at least 24 hours after taking one dose. ? ?Get help right away if: ?You develop new bowel or bladder control problems. ?You have unusual weakness or numbness in your arms or legs. ?You feel faint. ?

## 2021-04-04 NOTE — ED Notes (Addendum)
Called lab to check on blood work. Lab is still working on it and verified they have it. Blood collected and sent at [place].  ?

## 2021-04-04 NOTE — ED Notes (Signed)
An After Visit Summary was printed and given to the patient. Discharge instructions given and no further questions at this time.  

## 2021-04-04 NOTE — ED Provider Notes (Signed)
?[facility] DEPT ?Provider Note ? ? ?CSN: [id number] ?Arrival date & time: 04/04/21  1705 ? ?  ? ?History ? ?Chief Complaint  ?Patient presents with  ?? Back Pain  ? ? ?Lindsay Fry is a 61 y.o. female with past medical history of breast cancer status post double mastectomy, chronic diastolic CHF, CAD, hypertension, diabetes mellitus, ulcerative colitis, status post cholecystectomy, status post appendectomy, status post tubal ligation. ? ?Presents emergency department with a chief complaint of right lower back pain.  Patient reports that she has a history of chronic back pain however states that this feels different.  Patient reports that pain began on Friday and has been getting progressively worse since then.  Pain radiates to her right lower quadrant.  Pain describes the pain as "shooting from my pelvis to my spine."  Pain is "very positional," states the pain is worse with sitting up, changing positions, standing and ambulating.  Patient has had improvement in her pain when taking Tylenol and Aleve.  Patient denies any recent falls or traumatic injuries.  Patient endorses chronic unchanged constipation. ? ?Denies any numbness, weakness, saddle anesthesia, bowel/bladder dysfunction, fever, chills, dysuria, hematuria, urinary urgency, urinary frequency, nausea, vomiting, diarrhea, blood in stool, melena. ? ?Patient reports that she saw her primary care provider yesterday and was started on prednisone.  Patient is on day 2 of this medication. ? ? ?Back Pain ?Associated symptoms: abdominal pain   ?Associated symptoms: no chest pain, no dysuria, no fever, no headaches, no numbness and no weakness   ? ?  ? ?Home Medications ?Prior to Admission medications   ?Medication Sig Start Date End Date Taking? Authorizing Provider  ?acetaminophen (TYLENOL) 650 MG CR tablet Take 1,300 mg by mouth 2 (two) times daily.    [provider]  ?amantadine (SYMMETREL) 100 MG capsule Take 100 mg  by mouth 2 (two) times daily.    [provider]  ?Ascorbic Acid (VITAMIN C PO) Take 1,000 mg by mouth daily.    [provider]  ?aspirin 81 MG chewable tablet Chew 1 tablet (81 mg total) by mouth daily. 04/07/20   Furth, Cadence H, PA-C  ?atorvastatin (LIPITOR) 80 MG tablet TAKE 1 TABLET BY MOUTH ONCE EVERY EVENING 01/23/21   Minna Merritts, MD  ?B Complex Vitamins (VITAMIN B COMPLEX PO) Take 1 tablet by mouth every other day.    [provider]  ?BAYER MICROLET LANCETS lancets USE AS DIRECTED TO CHECK BLOOD SUGAR TWICE DAILY 05/20/16   Elayne Snare, MD  ?Blood Glucose Monitoring Suppl (CONTOUR NEXT MONITOR) w/Device KIT 1 each by Does not apply route in the morning and at bedtime. Use Contour Next meter to check blood sugar twice daily. 04/04/21   Elayne Snare, MD  ?Calcium Carb-Cholecalciferol (CALTRATE 600+D3 PO) Take 1 tablet by mouth daily. 300 mg Vitamin D3    [provider]  ?clopidogrel (PLAVIX) 75 MG tablet Take 1 tablet (75 mg total) by mouth daily. ?Patient not taking: Reported on 04/03/2021 06/28/20   Minna Merritts, MD  ?Co-Enzyme Q-10 100 MG CAPS Take 100 mg by mouth daily.    [provider]  ?dextroamphetamine (DEXEDRINE SPANSULE) 15 MG 24 hr capsule Take 15 mg by mouth as needed. 08/21/20   [provider]  ?diazepam (VALIUM) 10 MG tablet Take 5-10 mg by mouth daily as needed for anxiety.    [provider]  ?diphenhydrAMINE (BENADRYL) 25 MG tablet Take 50 mg by mouth at bedtime.  [provider]  ?docusate sodium (COLACE) 100 MG capsule Take 100-300 mg by mouth daily as needed for mild constipation or moderate constipation.    [provider]  ?ezetimibe (ZETIA) 10 MG tablet TAKE 1 TABLET(10 MG) BY MOUTH DAILY 12/27/20   Elayne Snare, MD  ?famotidine (PEPCID) 40 MG tablet Take 40 mg by mouth 2 (two) times daily. 30 min. Before meal and at bedtime    [provider]  ?fluticasone (FLONASE) 50 MCG/ACT nasal spray  Place 1 spray into both nostrils daily. 12/05/20   Eugenia Pancoast, FNP  ?gabapentin (NEURONTIN) 300 MG capsule TAKE 1 CAPSULE BY MOUTH 3 TIMES DAILY 03/06/21   Tower, Preston A, MD  ?glucose blood (CONTOUR NEXT TEST) test strip USE AS DIRECTED 04/04/21   Elayne Snare, MD  ?insulin aspart (NOVOLOG FLEXPEN) 100 UNIT/ML FlexPen Use 10 to 15 units when blood sugars are more than 200 04/04/21   Elayne Snare, MD  ?insulin detemir (LEVEMIR FLEXTOUCH) 100 UNIT/ML FlexPen Inject 60 Units into the skin every evening. 11/23/20   Elayne Snare, MD  ?Insulin Pen Needle (B-D UF III MINI PEN NEEDLES) 31G X 5 MM MISC USE FOR INJECTIONS TWICE DAILY 11/18/19   Elayne Snare, MD  ?JARDIANCE 25 MG TABS tablet TAKE 1 TABLET(25 MG) BY MOUTH DAILY 09/29/20   Elayne Snare, MD  ?ketoconazole (NIZORAL) 2 % cream Apply to affected areas one to two times daily as needed. 08/29/20   Brendolyn Patty, MD  ?lamoTRIgine (LAMICTAL) 200 MG tablet Take 400 mg by mouth daily.    [provider]  ?latanoprost (XALATAN) 0.005 % ophthalmic solution Place 1 drop into both eyes 2 (two) times a week.  10/12/10   [provider]  ?levothyroxine (SYNTHROID) 125 MCG tablet Take 1 tablet (125 mcg total) by mouth daily before breakfast. Take 2 tablets PO 6 days per week and take 3 tablets PO on day #7 (Mondays) 03/07/21   Elayne Snare, MD  ?loperamide (IMODIUM A-D) 2 MG tablet Take 2 mg by mouth daily as needed for diarrhea or loose stools.    [provider]  ?Melatonin 10 MG CAPS Take 10 mg by mouth at bedtime.    [provider]  ?mesalamine (LIALDA) 1.2 g EC tablet TAKE 2 TABLETS BY MOUTH DAILY 11/28/20   Irene Shipper, MD  ?metoprolol succinate (TOPROL-XL) 25 MG 24 hr tablet TAKE ONE-HALF TABLET BY MOUTH AS NEEDED AT BEDTIME FOR SYSTOLIC BLOOD PRESSURE GREATER THAN 100 11/03/20   Minna Merritts, MD  ?Multiple Vitamin (MULTIVITAMIN) tablet Take 1 tablet by mouth daily. Woman 50 plus    [provider]  ?nitroGLYCERIN (NITROSTAT)  0.4 MG SL tablet Place 1 tablet (0.4 mg total) under the tongue every 5 (five) minutes as needed for chest pain. 04/03/20   Marrianne Mood D, PA-C  ?olmesartan (BENICAR) 5 MG tablet Take 1 tablet (5 mg total) by mouth daily. 06/06/20   Dunn, Areta Haber, PA-C  ?ondansetron (ZOFRAN-ODT) 4 MG disintegrating tablet DISSOLVE 1 TABLET(4 MG) ON THE TONGUE EVERY 6 HOURS AS NEEDED FOR NAUSEA OR VOMITING 03/26/21   Irene Shipper, MD  ?pantoprazole (PROTONIX) 40 MG tablet TAKE 1 TABLET(40 MG) BY MOUTH TWICE DAILY 01/03/21   Irene Shipper, MD  ?polyethylene glycol (MIRALAX / GLYCOLAX) 17 g packet Take 17 g by mouth 3 (three) times daily as needed for moderate constipation, mild constipation or severe constipation. In water or beverage    [provider]  ?predniSONE (DELTASONE) 20 MG  tablet 2 tabs by mouth daily x 5 days, then  1 tab by mouth daily x 5 days, follow blood sugars while taking 04/03/21   Diona Browner, Amy E, MD  ?Probiotic Product (CULTURELLE PROBIOTICS PO) Take 1 capsule by mouth daily. For woman    [provider]  ?QUEtiapine (SEROQUEL XR) 400 MG 24 hr tablet Take 800 mg by mouth at bedtime.    [provider]  ?Semaglutide, 1 MG/DOSE, 4 MG/3ML SOPN Inject 1 mg into the skin once a week. Wednesdays    [provider]  ?simethicone (MYLICON) 80 MG chewable tablet Chew 80 mg by mouth 4 (four) times daily.    [provider]  ?Tavaborole (KERYDIN) 5 % SOLN Apply 1 application topically at bedtime. Apply to affected area of left great toenail 08/29/20   Brendolyn Patty, MD  ?valACYclovir (VALTREX) 1000 MG tablet TAKE 1 TABLET BY MOUTH ONCE DAILY 03/06/21   Tower, Wynelle Fanny, MD  ?VRAYLAR capsule Take 3 mg by mouth at bedtime. 02/28/20   [provider]  ?zaleplon (SONATA) 10 MG capsule Take 10-20 mg by mouth at bedtime.    [provider]  ?   ? ?Allergies    ?Ephedrine, Aspirin, Crestor [rosuvastatin], Erythromycin, Nsaids, and Oxycodone   ? ?Review of Systems   ?Review  of Systems  ?Constitutional:  Negative for chills and fever.  ?Eyes:  Negative for visual disturbance.  ?Respiratory:  Negative for shortness of breath.   ?Cardiovascular:  Negative for chest pain.

## 2021-04-04 NOTE — ED Notes (Addendum)
Called lab again. Lab is backed up and will be processing lab results soon.  ?

## 2021-04-04 NOTE — Telephone Encounter (Signed)
I spoke with pt; pt said now her pain is in RLQ and rt lumbar area; pt said she took 2 aleve about 12;30 but no real relief of pain.  Pt said she does not know what is going on to cause this kind of pain but pain level now is 10. Per 04/03/21 lab result notes Dr Diona Browner advised if pain worsens and is severe to go to ED. Pt declined EMS but will have her husband take her to [facility] ED. Sending note to Dr Diona Browner who is out of office, Dr Glori Bickers who is in office and Butch Penny CMA. ?

## 2021-04-06 LAB — URINE CULTURE: Culture: <10000 — AB

## 2021-04-10 ENCOUNTER — Encounter: Payer: Self-pay | Admitting: Family Medicine

## 2021-04-10 ENCOUNTER — Ambulatory Visit (INDEPENDENT_AMBULATORY_CARE_PROVIDER_SITE_OTHER)
Admission: RE | Admit: 2021-04-10 | Discharge: 2021-04-10 | Disposition: A | Discharge status: Home / Self Care | Payer: BC Managed Care – PPO | Source: Ambulatory Visit | Attending: Family Medicine | Admitting: Family Medicine

## 2021-04-10 ENCOUNTER — Other Ambulatory Visit: Payer: Self-pay

## 2021-04-10 ENCOUNTER — Encounter: Payer: Self-pay | Admitting: Internal Medicine

## 2021-04-10 ENCOUNTER — Ambulatory Visit (INDEPENDENT_AMBULATORY_CARE_PROVIDER_SITE_OTHER): Payer: BC Managed Care – PPO | Admitting: Family Medicine

## 2021-04-10 VITALS — BP 126/78 | HR 89 | Temp 97.7°F | Ht 66.5 in | Wt 212.5 lb

## 2021-04-10 DIAGNOSIS — M545 Low back pain, unspecified: Secondary | ICD-10-CM

## 2021-04-10 MED ORDER — CYCLOBENZAPRINE HCL 5 MG PO TABS: 5.0000 mg | ORAL_TABLET | Freq: Three times a day (TID) | ORAL | 0 refills | Status: DC | PRN

## 2021-04-10 NOTE — Progress Notes (Signed)
? ?Subjective:  ? ? Patient ID: Lindsay Fry, female    DOB: 17-Dec-1960, 61 y.o.   MRN: [id number] ? ?This visit occurred during the SARS-CoV-2 public health emergency.  Safety protocols were in place, including screening questions prior to the visit, additional usage of staff PPE, and extensive cleaning of exam room while observing appropriate contact time as indicated for disinfecting solutions.  ? ?HPI ?Pt presents for back pain  ? ?Wt Readings from Last 3 Encounters:  ?04/10/21 212 lb 8 oz (96.4 kg)  ?04/04/21 208 lb (94.3 kg)  ?04/03/21 208 lb 6.4 oz (94.5 kg)  ? ?33.78 kg/m? ? ?R lower lumbar  ?Pain rad to front/low abdomen  ? ?Is in PT for L knee- ? If any of those exercises worsened her  ? ? ?After constipation noted on CT ?Took a lot of miralax and used pericolace ?2 large bm today  ?Straining worsens it  ?Is hard to lean to her R side (lateral bend)  ?Ok to bend back and forward ?No buttock pain  ? ?No numbness or tingling  ?No weakness or change in gait  ? ?Using heat ?Uses aleve  ? ?This began after walking on 3/17  ?Took naproxen- ? Worsened her UC ?Saw Dr Diona Browner  ?Px prednisone  ? ?Last LS film in chart was 2018  ?Showed scoliosis and osteophytes ? ?She was seen recently in ER 3/22 ?Reported R side back pain since Friday  ?This is worse than her chronic back pain  ?Work up includec  ?Results for orders placed or performed during the hospital encounter of 04/04/21  ?Urine Culture  ? Specimen: Urine, Clean Catch  ?Result Value Ref Range  ? Specimen Description    ?  URINE, CLEAN CATCH ?Performed at [facility], [place] [address], [place], [place] [address] ?  ? Special Requests    ?  NONE ?Performed at [facility] [place] [address], [place], [place] [address] ?  ? Culture (A)   ?  <10,000 COLONIES/mL INSIGNIFICANT GROWTH ?Performed at [facility], [place] [address], [place], [place] [address] ?  ? Report Status 04/06/2021 FINAL   ?Comprehensive metabolic panel   ?Result Value Ref Range  ? Sodium 141 135 - 145 mmol/L  ? Potassium 4.1 3.5 - 5.1 mmol/L  ? Chloride 108 98 - 111 mmol/L  ? CO2 22 22 - 32 mmol/L  ? Glucose, Bld 197 (H) 70 - 99 mg/dL  ? BUN 12 8 - 23 mg/dL  ? Creatinine, Ser 0.64 0.44 - 1.00 mg/dL  ? Calcium 9.7 8.9 - 10.3 mg/dL  ? Total Protein 7.5 6.5 - 8.1 g/dL  ? Albumin 4.7 3.5 - 5.0 g/dL  ? AST 29 15 - 41 U/L  ? ALT 38 0 - 44 U/L  ? Alkaline Phosphatase 85 38 - 126 U/L  ? Total Bilirubin 0.3 0.3 - 1.2 mg/dL  ? GFR, Estimated >60 >60 mL/min  ? Anion gap 11 5 - 15  ?CBC with Differential  ?Result Value Ref Range  ? WBC 10.9 (H) 4.0 - 10.5 K/uL  ? RBC 5.43 (H) 3.87 - 5.11 MIL/uL  ? Hemoglobin 16.9 (H) 12.0 - 15.0 g/dL  ? HCT 52.3 (H) 36.0 - 46.0 %  ? MCV 96.3 80.0 - 100.0 fL  ? MCH 31.1 26.0 - 34.0 pg  ? MCHC 32.3 30.0 - 36.0 g/dL  ? RDW 13.2 11.5 - 15.5 %  ? Platelets 372 150 - 400 K/uL  ?  nRBC 0.0 0.0 - 0.2 %  ? Neutrophils Relative % 87 %  ? Neutro Abs 9.6 (H) 1.7 - 7.7 K/uL  ? Lymphocytes Relative 9 %  ? Lymphs Abs 0.9 0.7 - 4.0 K/uL  ? Monocytes Relative 3 %  ? Monocytes Absolute 0.3 0.1 - 1.0 K/uL  ? Eosinophils Relative 0 %  ? Eosinophils Absolute 0.0 0.0 - 0.5 K/uL  ? Basophils Relative 1 %  ? Basophils Absolute 0.1 0.0 - 0.1 K/uL  ? Immature Granulocytes 0 %  ? Abs Immature Granulocytes 0.03 0.00 - 0.07 K/uL  ?Urinalysis, Routine w reflex microscopic Urine, Clean Catch  ?Result Value Ref Range  ? Color, Urine YELLOW YELLOW  ? APPearance CLEAR CLEAR  ? Specific Gravity, Urine 1.033 (H) 1.005 - 1.030  ? pH 5.0 5.0 - 8.0  ? Glucose, UA >=500 (A) NEGATIVE mg/dL  ? Hgb urine dipstick NEGATIVE NEGATIVE  ? Bilirubin Urine NEGATIVE NEGATIVE  ? Ketones, ur NEGATIVE NEGATIVE mg/dL  ? Protein, ur NEGATIVE NEGATIVE mg/dL  ? Nitrite NEGATIVE NEGATIVE  ? Leukocytes,Ua NEGATIVE NEGATIVE  ? RBC / HPF 0-5 0 - 5 RBC/hpf  ? WBC, UA 0-5 0 - 5 WBC/hpf  ? Bacteria, UA NONE SEEN NONE SEEN  ? Squamous Epithelial / LPF 0-5 0 - 5  ? ?*Note: Due to a large number of results  and/or encounters for the requested time period, some results have not been displayed. A complete set of results can be found in Results Review.  ?  ?CT done to r/o renal stone ?CT Renal Stone Study ? ?Result Date: 04/04/2021 ?CLINICAL DATA:  Flank pain, kidney stone suspected. EXAM: CT ABDOMEN AND PELVIS WITHOUT CONTRAST TECHNIQUE: Multidetector CT imaging of the abdomen and pelvis was performed following the standard protocol without IV contrast. RADIATION DOSE REDUCTION: This exam was performed according to the departmental dose-optimization program which includes automated exposure control, adjustment of the mA and/or kV according to patient size and/or use of iterative reconstruction technique. COMPARISON:  Chest CT 03/02/2020 FINDINGS: Lower chest: Lung bases are clear. Hepatobiliary: Cholecystectomy.  Normal appearance of the liver. Pancreas: Unremarkable. No pancreatic ductal dilatation or surrounding inflammatory changes. Spleen: Normal in size without focal abnormality. Adrenals/Urinary Tract: Normal adrenal glands. Tiny stone in the left kidney lower pole. Negative for left hydronephrosis. Urinary bladder is decompressed. 6 mm stone in the right kidney lower pole without hydronephrosis. Stomach/Bowel: Large amount of stool throughout the colon. Cecum appears to be in the anterior abdomen and there are surgical clips near the cecum. Small bowel loops are decompressed. Mild distention of the stomach with fluid. No evidence for bowel obstruction or focal bowel inflammation. Vascular/Lymphatic: Aortic atherosclerosis. No enlarged abdominal or pelvic lymph nodes. Reproductive: Normal appearance of the uterus. Normal appearance of the right adnexa. There is a small low-density structure in the left ovary/adnexa that has water attenuation and measures 1.7 cm. This likely represents a small cystic structure and an incidental finding based on its size. Other: Negative for ascites. Negative for free air. 1.6 cm  peripherally calcified structure in the anterior lower pelvis may represent a granuloma. There is an additional calcification in left anterior abdomen on sequence 2 image 59. Musculoskeletal: Evidence for previous surgery in the proximal left femur. Old left posterior rib fractures. No acute osseous abnormality. A small metallic density along the anterior upper abdominal wall. IMPRESSION: 1. Nonobstructive renal calculi. Largest stone measures 6 mm in the right kidney lower pole. 2. Large amount of stool in the  colon. No evidence for bowel obstruction. Evidence for postoperative bowel changes. Electronically Signed   By: Markus Daft M.D.   On: 04/04/2021 18:17   ? ?Noted stones in kidney  ?Constipation  ? ?Patient Active Problem List  ? Diagnosis Date Noted  ? Acute right lower quadrant pain 04/03/2021  ? Dysuria 02/22/2021  ? Acute cystitis without hematuria 02/22/2021  ? Callus between toes 02/01/2021  ? Left knee pain 12/26/2020  ? Traumatic hematoma of right hand 12/05/2020  ? Unstable angina ([place]) 04/06/2020  ? Cerebellar ataxia in diseases classified elsewhere ([place]) 03/28/2020  ? Elevated transaminase level 03/27/2020  ? Current use of proton pump inhibitor 03/19/2020  ? Hypokalemia 03/07/2020  ? History of COVID-19 03/07/2020  ? Aortic atherosclerosis ([place]) 03/07/2020  ? Double vision 12/14/2019  ? MCI (mild cognitive impairment) 08/05/2019  ? Obesity (BMI 30-39.9) 04/23/2019  ? Dizziness 12/16/2018  ? Poor balance 12/16/2018  ? Fatigue 12/16/2018  ? Migraine 11/12/2018  ? Uncontrolled type 2 diabetes mellitus with hyperglycemia, with long-term current use of insulin ([place]) 02/19/2018  ? Pleural thickening 11/05/2017  ? Mouth ulcers 11/05/2017  ? Cervical radiculopathy due to degenerative joint disease of spine 10/28/2017  ? 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 Supraventricular tachycardia ([place]) 04/22/2017  ? Osteopenia 09/25/2016  ? Estrogen deficiency 05/30/2016  ? Need for hepatitis C screening test 03/19/2016  ?  Carcinoma of overlapping sites of left breast in female, estrogen receptor positive ([place]) 08/21/2015  ? Chronic constipation 01/18/2015  ? NSTEMI (non-ST elevated myocardial infarction) ([place]) 09/06/2014  ? [place]

## 2021-04-10 NOTE — Assessment & Plan Note (Signed)
R low back pain radiating to R low abd since 3/17  ?Rev note from office with Dr Diona Browner and ER note as well as labs/urine result and CT scan  ?Ruled out passing renal stone, also noted constipation  ? ?Past h/o scoliosis on xray as well as osteophytes in 2018  ?Will re check xray today  ?Reassuring exam/no neuro changes ?Has large abd girth, planning return to water exercise  ?Given handout for rehab /exercises  ?Using aleve (voltaren gel is alt) , but no imp with prednisone  ?Pending rad review for plan  ? ?

## 2021-04-10 NOTE — Patient Instructions (Addendum)
Use brief heat and ice  ?Walk slowly  ?Don't sit for more than 30 minutes at a time if able  ? ?Voltaren gel is an alternative to aleve  ?Icy hot is another option  ?Also TENS unit  ? ?Try the rehab exercises  ? ?Xray now  ?We will update you when that is read / may be tomorrow ? ?If symptoms worsen let us know  ?

## 2021-04-11 ENCOUNTER — Encounter: Payer: Self-pay | Admitting: Internal Medicine

## 2021-04-15 ENCOUNTER — Encounter: Payer: Self-pay | Admitting: Family Medicine

## 2021-04-15 DIAGNOSIS — M545 Low back pain, unspecified: Secondary | ICD-10-CM

## 2021-04-16 ENCOUNTER — Telehealth: Payer: Self-pay | Admitting: *Deleted

## 2021-04-16 NOTE — Telephone Encounter (Signed)
Sorry, I am out of the office today.  I think that the pool should help.  Can supplement the cyclobenzaprine if really needed in the evening but watch out for sedation.   Thanks for keeping me posted. ?

## 2021-04-16 NOTE — Telephone Encounter (Signed)
Tried to call patient and did not get an answer. Will have to try and contact patient later. ?

## 2021-04-16 NOTE — Telephone Encounter (Signed)
Pt sent a message saying: ? ?I?m off the prednisone & cyclobenzaprine.  ?I?m still taking ALEVE after supper for generalized back pain, as the Tylenol Arthritis & gabapentin are still a bit insufficient in fibromyalgia nighttime pain relief.  ?Pain score by 7:30 is about a 6.  ?I take Tyl AS & gaba at 7:30 PM, & even at 9:30, my pain score is a 5.  ? ?The pool has re-opened ([facility]) & my friend and I have already been to water walk for an hour both last Thursday & Friday.  ? ?This coming week we are only going tomorrow and Friday as I have 2-day colonoscopy prep  ?Monday night through Wednesday morning, with procedure Wednesday at 3:30.  ? ?Thank you & to [place] so very much for your patience with me, & for being so very thorough in your care.  ?All 4 of [place] are blessed to be your & Dr Josefine Class patients!  ? ?I have been going to  ?[facility] on [address], [place].  ?Please send them the order. Thx ?

## 2021-04-17 DIAGNOSIS — F3181 Bipolar II disorder: Secondary | ICD-10-CM | POA: Insufficient documentation

## 2021-04-17 NOTE — Telephone Encounter (Signed)
See phone note

## 2021-04-18 ENCOUNTER — Ambulatory Visit (AMBULATORY_SURGERY_CENTER): Payer: BC Managed Care – PPO | Admitting: Internal Medicine

## 2021-04-18 ENCOUNTER — Encounter: Payer: Self-pay | Admitting: Internal Medicine

## 2021-04-18 VITALS — BP 119/62 | HR 81 | Temp 98.9°F | Resp 15 | Ht 66.0 in | Wt 206.0 lb

## 2021-04-18 DIAGNOSIS — Z1211 Encounter for screening for malignant neoplasm of colon: Secondary | ICD-10-CM | POA: Diagnosis not present

## 2021-04-18 DIAGNOSIS — K5100 Ulcerative (chronic) pancolitis without complications: Secondary | ICD-10-CM

## 2021-04-18 DIAGNOSIS — D122 Benign neoplasm of ascending colon: Secondary | ICD-10-CM

## 2021-04-18 DIAGNOSIS — K529 Noninfective gastroenteritis and colitis, unspecified: Secondary | ICD-10-CM | POA: Diagnosis not present

## 2021-04-18 DIAGNOSIS — Z8719 Personal history of other diseases of the digestive system: Secondary | ICD-10-CM

## 2021-04-18 DIAGNOSIS — K6389 Other specified diseases of intestine: Secondary | ICD-10-CM | POA: Diagnosis not present

## 2021-04-18 DIAGNOSIS — Z86010 Personal history of colonic polyps: Secondary | ICD-10-CM

## 2021-04-18 DIAGNOSIS — K5150 Left sided colitis without complications: Secondary | ICD-10-CM | POA: Diagnosis not present

## 2021-04-18 MED ORDER — SODIUM CHLORIDE 0.9 % IV SOLN: 500.0000 mL | Freq: Once | INTRAVENOUS | Status: DC

## 2021-04-18 NOTE — Progress Notes (Signed)
Called to room to assist during endoscopic procedure.  Patient ID and intended procedure confirmed with present staff. Received instructions for my participation in the procedure from the performing physician.  

## 2021-04-18 NOTE — Progress Notes (Signed)
PT taken to PACU. Monitors in place. VSS. Report given to RN. 

## 2021-04-18 NOTE — Op Note (Signed)
[place] ?Patient Name: Lindsay Fry ?Procedure Date: 04/18/2021 1:28 PM ?MRN: [id number] ?Endoscopist: Docia Chuck. Henrene Pastor , MD ?Age: 61 ?Referring MD:  ?Date of Birth: 1960-02-09 ?Gender: Female ?Account #: [id number] ?Procedure:                Colonoscopy with biopsies; with cold snare  ?                          polypectomy x 1 ?Indications:              High risk colon cancer surveillance: Personal  ?                          history of multiple (3 or more) adenomas and  ?                          sessile serrated polyps, High risk colon cancer  ?                          surveillance: Ulcerative pancolitis of 8 (or more)  ?                          years duration. Multiple previous examinations.  ?                          Most recent April 2020 ?Medicines:                Monitored Anesthesia Care ?Procedure:                Pre-Anesthesia Assessment: ?                          - Prior to the procedure, a History and Physical  ?                          was performed, and patient medications and  ?                          allergies were reviewed. The patient's tolerance of  ?                          previous anesthesia was also reviewed. The risks  ?                          and benefits of the procedure and the sedation  ?                          options and risks were discussed with the patient.  ?                          All questions were answered, and informed consent  ?                          was obtained. Prior Anticoagulants: The patient has  ?  taken no previous anticoagulant or antiplatelet  ?                          agents. ASA Grade Assessment: III - A patient with  ?                          severe systemic disease. After reviewing the risks  ?                          and benefits, the patient was deemed in  ?                          satisfactory condition to undergo the procedure. ?                          After obtaining informed consent, the colonoscope   ?                          was passed under direct vision. Throughout the  ?                          procedure, the patient's blood pressure, pulse, and  ?                          oxygen saturations were monitored continuously. The  ?                          CF [id number] #[id number] was introduced through the anus  ?                          and advanced to the the cecum, identified by  ?                          appendiceal orifice and ileocecal valve. The  ?                          ileocecal valve, appendiceal orifice, and rectum  ?                          were photographed. The quality of the bowel  ?                          preparation was good. The colonoscopy was performed  ?                          without difficulty. The patient tolerated the  ?                          procedure well. The bowel preparation used was  ?                          SUPREP via split dose instruction. ?Scope In: 2:53:58 PM ?Scope Out: 3:21:34 PM ?Scope Withdrawal Time: 0 hours 15 minutes 32 seconds  ?Total Procedure  Duration: 0 hours 27 minutes 36 seconds  ?Findings:                 A 3 mm polyp was found in the ascending colon. The  ?                          polyp was sessile. The polyp was removed with a  ?                          cold snare. Resection and retrieval were complete. ?                          The entire examined colon revealed changes  ?                          consistent with melanosis coli. No obvious active  ?                          colitis. Otherwise normal exam including  ?                          retroflexed view.. Four biopsies were taken every  ?                          10 cm with a cold forceps from the entire colon for  ?                          ulcerative colitis surveillance. These biopsy  ?                          specimens were sent to Pathology. ?                          -The colon is REDUNDANT. ?Complications:            No immediate complications. Estimated blood loss:  ?                           None. ?Estimated Blood Loss:     Estimated blood loss: none. ?Impression:               - One 3 mm polyp in the ascending colon, removed  ?                          with a cold snare. Resected and retrieved. ?                          - Melanosis coli ?                          - The entire examined colon is normal on direct and  ?                          retroflexion views. No obvious active colitis. ?                          -  REDUNDANT COLON ?Recommendation:           - Repeat colonoscopy in 3 years for surveillance,  ?                          if no dysplasia on biopsies. ?                          - Patient has a contact number available for  ?                          emergencies. The signs and symptoms of potential  ?                          delayed complications were discussed with the  ?                          patient. Return to normal activities tomorrow.  ?                          Written discharge instructions were provided to the  ?                          patient. ?                          - Resume previous diet. ?                          - Continue present medications. ?                          - Await pathology results. ?Docia Chuck. Henrene Pastor, MD ?04/18/2021 3:34:39 PM ?This report has been signed electronically. ?

## 2021-04-18 NOTE — Progress Notes (Signed)
HISTORY OF PRESENT ILLNESS: ?  ?Lindsay Fry is a 61 y.o. female with MULTIPLE SIGNIFICANT medical problems as listed below.  GI problems include chronic GERD, chronic constipation, morbid obesity, chronic ulcerative colitis, and a history of multiple adenomatous and sessile serrated polyps for which she is under close surveillance.  Significant general medical problems include insulin requiring diabetes mellitus, obesity, hypertension, arthritis, history of breast cancer, and coronary artery disease for which she is undergone coronary artery interventions.  She also has a history of bipolar disorder and OCD.  She was last seen in the office July 12, 2020 regarding the need for surveillance colonoscopy and chronic constipation.  Due to her recent coronary artery intervention and the need to be on dual antiplatelet therapy it was decided to delay her surveillance examination until the 1 year anniversary of her coronary artery stent placement.  She was seen by cardiology March 05, 2021.  I have reviewed that note.  She has been cleared for colonoscopy and is anticipating discontinuation of Plavix on April 06, 2021.  Excerpt from that visit as follows: ?"Pre-op clearance for colonoscopy.  ?Patient needing routine colonoscopy. She is stable from a cardiac perspective. According to the Revised Cardiac Risk Index she is a Class 2 risk, 6% 30 day risk of death, MI or cardiac arrest. METS>4. OK to indefinitely discontinue Plavix 04/06/21 as it will be a year post-stent. No further ischemic work-up indicated prior to procedure." ? ?Patient tells me that she continues with chronic constipation.  She is using MiraLAX to regulate her bowels.  In terms of GERD, symptoms are currently controlled with pantoprazole 40 mg twice daily.  No dysphagia.  She had been successful in weight reduction.  However, since her last visit she has gained about 20 pounds.  Other complaints include bloating and nausea. ? ?Her last colonoscopy  was performed May 14, 2018.  2 diminutive polyps at that time.  Exam was otherwise normal.  Random colon biopsies were unremarkable.  The small polyp was tubular adenoma.  Review of blood work from February 12, 2021 shows unremarkable comprehensive metabolic panel including liver tests.  CBC from November shows a hemoglobin of 14.7 ?  ?REVIEW OF SYSTEMS: ?  ?All non-GI ROS negative unless otherwise stated in the HPI except for sinus infection, arthritis, back pain, cough, fatigue, night sweats, headaches, sleeping problems, shortness of breath ?  ?    ?Past Medical History:  ?Diagnosis Date  ? ADD (attention deficit disorder) 11/19/2012  ? Allergic rhinitis, cause unspecified    ? Anxiety state, unspecified    ? Arthritis    ? Asymptomatic postmenopausal status (age-related) (natural)    ? Atypical hyperplasia of left breast 2000  ? Atypical mole 03/16/2018  ?  R paraspinal mid upper back, excision  ? Atypical mole 02/17/2018  ?  R spinal upper back, moderate  ? Atypical mole 08/11/2017  ?  L sternum, excision  ? Atypical mole 05/27/2017  ?  L post neck, moderate  ? Benign neoplasm of other and unspecified site of the digestive system    ? Bipolar disorder ([place])    ? Breast cancer ([place])    ?  Left Breast 50m invasive CA left uiq, dcis left uoq and a lot ADH, ALH in both breasts.  ? CAD (coronary artery disease)    ?  a. 08/2014 NSTEMI/Cath: LM nl, LAD 50p, D1/D2 min irregs, LCX nl, OM1 40, LPDA nl, RI 99ost (2.048mvessel->med Rx), RCA nl, AM 60, nl  EF; b. 11/2016 MV: EF 74%, no ischemia (performed 2/2 dyspnea & new inf TWI).  ? Carcinoma of left breast ([place]) 06/02/2014  ? Cataract    ? Chronic diastolic CHF (congestive heart failure) ([place])    ?  a. 08/2014 Echo: EF 60-65%, Gr 1 DD, nl LA.  ? Clotting disorder ([place])    ?  on blood thiner, Plavix  ? Cyst    ?  on Achilles tendon  ? Depression    ? Diabetes mellitus ([place])    ?  frank  ? Dysuria    ? Edema    ? Family history of malignant neoplasm of gastrointestinal tract     ? Fibromyalgia    ? GERD (gastroesophageal reflux disease)    ? Glaucoma    ?  pre glaucoma, 05/14/2018 pt. denies glaucoma  ? Headache    ?  migraines  ? Hiatal hernia    ? History of alcoholism ([place])    ? History of migraines    ? History of ovarian cyst    ? Hx of adenomatous colonic polyps    ? Insomnia, unspecified    ? Myocardial infarction [facility]) 09-04-14  ? OCD (obsessive compulsive disorder)    ? Osteopenia 09/25/2016  ?  Femoral neck T -1.1  9/18  ? Other screening mammogram    ? Pure hypercholesterolemia    ? Rosacea    ? Subacute confusional state 11/19/2012  ? Tubular adenoma of colon 01/10/11  ? Ulcerative colitis, unspecified    ? Unspecified asthma(493.90)    ? Unspecified essential hypertension    ? Unspecified hypothyroidism    ? Unspecified vitamin D deficiency    ? Vertigo    ?  ?  ?     ?Past Surgical History:  ?Procedure Laterality Date  ? APPENDECTOMY      ? AXILLARY SENTINEL NODE BIOPSY Left 05/23/2014  ?  Procedure: AXILLARY SENTINEL NODE BIOPSY;  Surgeon: Christene Lye, MD;  Location: [facility];  Service: General;  Laterality: Left;  ? BREAST BIOPSY   08/1998  ?  on tamoxifen, atypical hyperplasia  ? BREAST BIOPSY Left 04/2014  ? BREAST SURGERY Left 08/1998  ?  lumpectomy/ Dr Sharlet Salina  ? BREAST SURGERY Bilateral 05/23/2014  ?  Mastectomy  ? BUNIONECTOMY Right    ? CARDIAC CATHETERIZATION N/A 09/05/2014  ?  Procedure: Left Heart Cath and Coronary Angiography;  Surgeon: Wellington Hampshire, MD;  Location: [facility];  Service: Cardiovascular;  Laterality: N/A;  ? CARDIAC CATHETERIZATION   09/05/2014  ? CATARACT EXTRACTION W/ INTRAOCULAR LENS IMPLANT Bilateral    ? CESAREAN SECTION   1996/1997  ?  placenta previa, gest DM, pre-eclampsia  ? CHOLECYSTECTOMY   1997  ?  adhesions also  ? COLONOSCOPY   01/2001  ?  Ulcerative colitis  ? COLONOSCOPY   04/2004  ?  UC, polyp  ? COLONOSCOPY   12/2006  ?  UC, no polyps  ? CORONARY STENT INTERVENTION N/A 04/06/2020  ?  Procedure: CORONARY  STENT INTERVENTION;  Surgeon: Nelva Bush, MD;  Location: [facility];  Service: Cardiovascular;  Laterality: N/A;  ? DEXA   04/1999 and 2010  ?  normal  ? ESOPHAGOGASTRODUODENOSCOPY   09/2001  ?  polyp  ? exercise stress test   11/2003  ?  negative  ? FEMUR FRACTURE SURGERY Left    ? LEFT HEART CATH AND CORONARY ANGIOGRAPHY N/A 04/06/2020  ?  Procedure: LEFT HEART CATH  AND CORONARY ANGIOGRAPHY;  Surgeon: Minna Merritts, MD;  Location: [facility];  Service: Cardiovascular;  Laterality: N/A;  ? MASTECTOMY Bilateral    ? MECKEL DIVERTICULUM EXCISION   1999  ? NASAL SINUS SURGERY   05/1997  ? nuclear stress test   06/2007  ?  negative  ? precancerous mole removed      ? RIGHT AND LEFT HEART CATH      ?  with 1 stent placed in March 2022  ? SIMPLE MASTECTOMY WITH AXILLARY SENTINEL NODE BIOPSY Bilateral 05/23/2014  ?  Procedure: Bilateral simple mastectomy, left sentinel node biopsy ;  Surgeon: Christene Lye, MD;  Location: [facility];  Service: General;  Laterality: Bilateral;  ? Sleep study   11/2007  ?  no apnea, but did snore (done by [facility])  ? TONSILLECTOMY   1984  ? TUBAL LIGATION      ? WISDOM TOOTH EXTRACTION   1980's  ?  ?  ?Social History ?Lindsay Fry  reports that she quit smoking about 28 years ago. Her smoking use included cigarettes. She has a 1.25 pack-year smoking history. She has never used smokeless tobacco. She reports that she does not drink alcohol and does not use drugs. ?  ?family history includes Alzheimer's disease in her father; Breast cancer in an other family member; Colon cancer in her father; Colon polyps in her father; Coronary artery disease in her father, mother, and another family member; Crohn's disease in her mother; Dementia in her father; Diabetes in an other family member; Hypertension in her mother; Osteoporosis in her mother; Stroke in her cousin. ?  ?     ?Allergies  ?Allergen Reactions  ? Ephedrine Other (See Comments)  ?    Pt becomes  hyper  ? Aspirin    ?    REACTION: aggrivates colitis ?Tolerates 81 mg but unable to take doses >81 mg  ? Crestor [Rosuvastatin]    ?    REACTION: increased lfts  ? Erythromycin    ?    REACTION: GI upse

## 2021-04-18 NOTE — Patient Instructions (Signed)
Handout on polyps provided  ? ?Await pathology results.  ? ?Continue current medications.  ? ?Repeat colonoscopy in 3 years  ? ?YOU HAD AN ENDOSCOPIC PROCEDURE TODAY AT [facility]:   Refer to the procedure report that was given to you for any specific questions about what was found during the examination.  If the procedure report does not answer your questions, please call your gastroenterologist to clarify.  If you requested that your care partner not be given the details of your procedure findings, then the procedure report has been included in a sealed envelope for you to review at your convenience later. ? ?YOU SHOULD EXPECT: Some feelings of bloating in the abdomen. Passage of more gas than usual.  Walking can help get rid of the air that was put into your GI tract during the procedure and reduce the bloating. If you had a lower endoscopy (such as a colonoscopy or flexible sigmoidoscopy) you may notice spotting of blood in your stool or on the toilet paper. If you underwent a bowel prep for your procedure, you may not have a normal bowel movement for a few days. ? ?Please Note:  You might notice some irritation and congestion in your nose or some drainage.  This is from the oxygen used during your procedure.  There is no need for concern and it should clear up in a day or so. ? ?SYMPTOMS TO REPORT IMMEDIATELY: ? ?Following lower endoscopy (colonoscopy or flexible sigmoidoscopy): ? Excessive amounts of blood in the stool ? Significant tenderness or worsening of abdominal pains ? Swelling of the abdomen that is new, acute ? Fever of 100?F or higher ? ?For urgent or emergent issues, a gastroenterologist can be reached at any hour by calling [phone]. ?Do not use MyChart messaging for urgent concerns.  ? ? ?DIET:  We do recommend a small meal at first, but then you may proceed to your regular diet.  Drink plenty of fluids but you should avoid alcoholic beverages for 24 hours. ? ?ACTIVITY:   You should plan to take it easy for the rest of today and you should NOT DRIVE or use heavy machinery until tomorrow (because of the sedation medicines used during the test).   ? ?FOLLOW UP: ?Our staff will call the number listed on your records 48-72 hours following your procedure to check on you and address any questions or concerns that you may have regarding the information given to you following your procedure. If we do not reach you, we will leave a message.  We will attempt to reach you two times.  During this call, we will ask if you have developed any symptoms of COVID 19. If you develop any symptoms (ie: fever, flu-like symptoms, shortness of breath, cough etc.) before then, please call [phone].  If you test positive for Covid 19 in the 2 weeks post procedure, please call and report this information to [place].   ? ?If any biopsies were taken you will be contacted by phone or by letter within the next 1-3 weeks.  Please call us at [phone] if you have not heard about the biopsies in 3 weeks.  ? ? ?SIGNATURES/CONFIDENTIALITY: ?You and/or your care partner have signed paperwork which will be entered into your electronic medical record.  These signatures attest to the fact that that the information above on your After Visit Summary has been reviewed and is understood.  Full responsibility of the confidentiality of this discharge information lies with you  and/or your care-partner. ? ? ?

## 2021-04-19 NOTE — Telephone Encounter (Signed)
Spoke to pt

## 2021-04-23 ENCOUNTER — Telehealth: Payer: Self-pay | Admitting: *Deleted

## 2021-04-23 DIAGNOSIS — R2681 Unsteadiness on feet: Secondary | ICD-10-CM | POA: Diagnosis not present

## 2021-04-23 DIAGNOSIS — M6281 Muscle weakness (generalized): Secondary | ICD-10-CM | POA: Diagnosis not present

## 2021-04-23 DIAGNOSIS — M25562 Pain in left knee: Secondary | ICD-10-CM | POA: Diagnosis not present

## 2021-04-23 NOTE — Telephone Encounter (Signed)
No answer on f/u call- unable to hear pt  ?

## 2021-04-23 NOTE — Telephone Encounter (Signed)
?  Follow up Call- ? ? ?  04/18/2021  ?  1:37 PM  ?Call back number  ?Post procedure Call Back phone  # [phone]  ?Permission to leave phone message Yes  ?  ? ?Patient questions: ? ?Do you have a fever, pain , or abdominal swelling? No. ?Pain Score  0 * ? ?Have you tolerated food without any problems? Yes.   ? ?Have you been able to return to your normal activities? Yes.   ? ?Do you have any questions about your discharge instructions: ?Diet   No. ?Medications  No. ?Follow up visit  No. ? ?Do you have questions or concerns about your Care? No. ? ?Actions: ?* If pain score is 4 or above: ?No action needed, pain <4. ? ? ?

## 2021-04-26 ENCOUNTER — Encounter: Payer: Self-pay | Admitting: Internal Medicine

## 2021-04-26 DIAGNOSIS — F411 Generalized anxiety disorder: Secondary | ICD-10-CM | POA: Diagnosis not present

## 2021-04-26 DIAGNOSIS — F901 Attention-deficit hyperactivity disorder, predominantly hyperactive type: Secondary | ICD-10-CM | POA: Diagnosis not present

## 2021-04-26 DIAGNOSIS — F3112 Bipolar disorder, current episode manic without psychotic features, moderate: Secondary | ICD-10-CM | POA: Diagnosis not present

## 2021-05-01 ENCOUNTER — Ambulatory Visit: Payer: BC Managed Care – PPO | Admitting: Internal Medicine

## 2021-05-01 ENCOUNTER — Other Ambulatory Visit: Payer: BC Managed Care – PPO

## 2021-05-01 NOTE — Telephone Encounter (Signed)
Don't see a PT referral placed with route to PCP for review  ?

## 2021-05-01 NOTE — Addendum Note (Signed)
Addended by: [name] A on: 05/01/2021 02:03 PM ? ? Modules accepted: Orders ? ?

## 2021-05-04 DIAGNOSIS — M6281 Muscle weakness (generalized): Secondary | ICD-10-CM | POA: Diagnosis not present

## 2021-05-04 DIAGNOSIS — R2681 Unsteadiness on feet: Secondary | ICD-10-CM | POA: Diagnosis not present

## 2021-05-04 DIAGNOSIS — M25562 Pain in left knee: Secondary | ICD-10-CM | POA: Diagnosis not present

## 2021-05-09 ENCOUNTER — Encounter: Payer: Self-pay | Admitting: Family Medicine

## 2021-05-09 DIAGNOSIS — M6281 Muscle weakness (generalized): Secondary | ICD-10-CM | POA: Diagnosis not present

## 2021-05-09 DIAGNOSIS — M25562 Pain in left knee: Secondary | ICD-10-CM | POA: Diagnosis not present

## 2021-05-09 DIAGNOSIS — R2681 Unsteadiness on feet: Secondary | ICD-10-CM | POA: Diagnosis not present

## 2021-05-09 NOTE — Telephone Encounter (Signed)
Will route to [place] to look into ?

## 2021-05-10 ENCOUNTER — Encounter: Payer: Self-pay | Admitting: Endocrinology

## 2021-05-10 DIAGNOSIS — E1169 Type 2 diabetes mellitus with other specified complication: Secondary | ICD-10-CM

## 2021-05-11 DIAGNOSIS — M25562 Pain in left knee: Secondary | ICD-10-CM | POA: Diagnosis not present

## 2021-05-11 DIAGNOSIS — R2681 Unsteadiness on feet: Secondary | ICD-10-CM | POA: Diagnosis not present

## 2021-05-11 DIAGNOSIS — M6281 Muscle weakness (generalized): Secondary | ICD-10-CM | POA: Diagnosis not present

## 2021-05-11 MED ORDER — LEVEMIR FLEXTOUCH 100 UNIT/ML ~~LOC~~ SOPN: 60.0000 [IU] | PEN_INJECTOR | Freq: Every evening | SUBCUTANEOUS | 5 refills | Status: DC

## 2021-05-11 MED ORDER — SEMAGLUTIDE (1 MG/DOSE) 4 MG/3ML ~~LOC~~ SOPN: 1.0000 mg | PEN_INJECTOR | SUBCUTANEOUS | 2 refills | Status: DC

## 2021-05-14 ENCOUNTER — Other Ambulatory Visit: Payer: Self-pay | Admitting: Internal Medicine

## 2021-05-16 NOTE — Assessment & Plan Note (Addendum)
Chronic, ? control ?Under care of GI, Has colonoscopy upcoming next month,Stable with lialda currently ?

## 2021-05-16 NOTE — Assessment & Plan Note (Signed)
Chronic, most recent A1c at goal ? ?Follow closely while being treated with prednisone given it will likely increase blood sugar. ?

## 2021-05-22 ENCOUNTER — Other Ambulatory Visit: Payer: Self-pay

## 2021-05-22 DIAGNOSIS — C50812 Malignant neoplasm of overlapping sites of left female breast: Secondary | ICD-10-CM

## 2021-05-23 ENCOUNTER — Encounter: Payer: Self-pay | Admitting: Internal Medicine

## 2021-05-23 ENCOUNTER — Inpatient Hospital Stay: Payer: BC Managed Care – PPO | Attending: Internal Medicine | Admitting: Internal Medicine

## 2021-05-23 ENCOUNTER — Inpatient Hospital Stay: Payer: BC Managed Care – PPO

## 2021-05-23 ENCOUNTER — Encounter: Payer: Self-pay | Admitting: Endocrinology

## 2021-05-23 DIAGNOSIS — D751 Secondary polycythemia: Secondary | ICD-10-CM | POA: Diagnosis not present

## 2021-05-23 DIAGNOSIS — I2510 Atherosclerotic heart disease of native coronary artery without angina pectoris: Secondary | ICD-10-CM | POA: Insufficient documentation

## 2021-05-23 DIAGNOSIS — C50812 Malignant neoplasm of overlapping sites of left female breast: Secondary | ICD-10-CM | POA: Insufficient documentation

## 2021-05-23 DIAGNOSIS — Z79811 Long term (current) use of aromatase inhibitors: Secondary | ICD-10-CM | POA: Diagnosis not present

## 2021-05-23 DIAGNOSIS — Z87891 Personal history of nicotine dependence: Secondary | ICD-10-CM | POA: Insufficient documentation

## 2021-05-23 DIAGNOSIS — Z794 Long term (current) use of insulin: Secondary | ICD-10-CM | POA: Diagnosis not present

## 2021-05-23 DIAGNOSIS — Z9013 Acquired absence of bilateral breasts and nipples: Secondary | ICD-10-CM | POA: Diagnosis not present

## 2021-05-23 DIAGNOSIS — Z79899 Other long term (current) drug therapy: Secondary | ICD-10-CM | POA: Diagnosis not present

## 2021-05-23 DIAGNOSIS — Z7902 Long term (current) use of antithrombotics/antiplatelets: Secondary | ICD-10-CM | POA: Diagnosis not present

## 2021-05-23 DIAGNOSIS — Z7982 Long term (current) use of aspirin: Secondary | ICD-10-CM | POA: Insufficient documentation

## 2021-05-23 DIAGNOSIS — Z170 Estrogen receptor positive status [ER+]: Secondary | ICD-10-CM | POA: Insufficient documentation

## 2021-05-23 LAB — CBC WITH DIFFERENTIAL/PLATELET
Abs Immature Granulocytes: 0.02 10*3/uL (ref 0.00–0.07)
Basophils Absolute: 0.1 10*3/uL (ref 0.0–0.1)
Basophils Relative: 1 %
Eosinophils Absolute: 0.3 10*3/uL (ref 0.0–0.5)
Eosinophils Relative: 4 %
HCT: 47.1 % — ABNORMAL HIGH (ref 36.0–46.0)
Hemoglobin: 15.3 g/dL — ABNORMAL HIGH (ref 12.0–15.0)
Immature Granulocytes: 0 %
Lymphocytes Relative: 28 %
Lymphs Abs: 2 10*3/uL (ref 0.7–4.0)
MCH: 30.4 pg (ref 26.0–34.0)
MCHC: 32.5 g/dL (ref 30.0–36.0)
MCV: 93.5 fL (ref 80.0–100.0)
Monocytes Absolute: 0.7 10*3/uL (ref 0.1–1.0)
Monocytes Relative: 10 %
Neutro Abs: 4 10*3/uL (ref 1.7–7.7)
Neutrophils Relative %: 57 %
Platelets: 264 10*3/uL (ref 150–400)
RBC: 5.04 MIL/uL (ref 3.87–5.11)
RDW: 12.8 % (ref 11.5–15.5)
WBC: 7.1 10*3/uL (ref 4.0–10.5)
nRBC: 0 % (ref 0.0–0.2)

## 2021-05-23 LAB — COMPREHENSIVE METABOLIC PANEL
ALT: 41 U/L (ref 0–44)
AST: 31 U/L (ref 15–41)
Albumin: 4.4 g/dL (ref 3.5–5.0)
Alkaline Phosphatase: 71 U/L (ref 38–126)
Anion gap: 7 (ref 5–15)
BUN: 20 mg/dL (ref 8–23)
CO2: 24 mmol/L (ref 22–32)
Calcium: 8.7 mg/dL — ABNORMAL LOW (ref 8.9–10.3)
Chloride: 104 mmol/L (ref 98–111)
Creatinine, Ser: 0.72 mg/dL (ref 0.44–1.00)
GFR, Estimated: >60 mL/min (ref >60)
Glucose, Bld: 147 mg/dL — ABNORMAL HIGH (ref 70–99)
Potassium: 4.1 mmol/L (ref 3.5–5.1)
Sodium: 135 mmol/L (ref 135–145)
Total Bilirubin: 0.8 mg/dL (ref 0.3–1.2)
Total Protein: 6.7 g/dL (ref 6.5–8.1)

## 2021-05-23 NOTE — Assessment & Plan Note (Addendum)
#   LEFT Breast Stage I s/p mastec [prophy right mastec] on Low risk mammoprint- finished Letrozole [5 years; AUG 2021]; STABLE-no clinical evidence of recurrence.  ? 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   ? ?# CAD [s/p stenting; March 2022]- on asprin/plavix;  ? ?# Ulcerative colitis- s/p colo- April 2023-STABLE.  ? ?# BMD- Sep 2022-FEB T score= -1; normal. Continue ca+vit D- STABLE.  ? ?# DISPOSITION: ?# follow up in 12 monthsMD/labs-cbc/cmp; Dr.B  ? ?

## 2021-05-23 NOTE — Progress Notes (Signed)
Patient denies new problems/concerns today.   °

## 2021-05-23 NOTE — Progress Notes (Signed)
[facility] ?OFFICE PROGRESS NOTE ? ?Patient Care Team: ?Tower, Marne A, MD as PCP - General ?Kumar, Ajay, MD as Consulting Physician (Endocrinology) ?Kaur, Rupinder, MD as Consulting Physician (Psychiatry) ?Gollan, Timothy J, MD as Consulting Physician (Cardiology) ?Lambert, Cameron T, MD as Consulting Physician (Cardiology) ? ? Cancer Staging  ?History of breast cancer ?Staging form: Breast, AJCC 7th Edition ?- Clinical: Stage IA (T1b, N0, M0) - Unsigned ?Laterality: Bilateral ?Method of lymph node assessment: Sentinel lymph node biopsy ?Estrogen receptor status: Positive ?Progesterone receptor status: Positive ?HER2 status: Negative ?Multi-gene signature risk of recurrence: Low risk ?Staging comments: Kidney frequent changes in the 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  No invasive cancer in the right breast.  Status post bilateral mastectomy ? ? ? ?Oncology History Overview Note  ?1.  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 ? ?2, April of 2016 patient had  stereotactic biopsy of abnormal breast lesion in the left breastwhich was positive for invasive carcinoma and ductal carcinoma in situ.  Patient underwent bilateral mastectomy  May 9 , 2016 ? ?Patient has invasive carcinoma and left breast with significant changes in the right breast ductal carcinoma in situ patient underwent bilateral mastectomy.  ?Left breast: T1 b N0 M0 stage IB estrogen and progesterone receptor HER-2/neu- Not overexpressed ? ?2.    Multi-gene analysis with  MAMOPRINT low risk for recurrent disease ? ?Patient was started on letrozole and calcium and vitamin D (June, 2016) ? ? ?# Hx Anxiety/depression [Dr.kaur; [place]]; Hx of colitis [s/p colo- May 2019]; Obesity ?------------------------------------------------------------------------------------- ? ?# SURVIVORSHIP-P ? ?DIAGNOSIS:LEFT BREAST CA ER-POS ? ?STAGE: I        ;GOALS:  curative ? ?CURRENT/MOST RECENT THERAPY: Letrozole-x5 years stopped August 2021. ? ?  ?History of breast cancer  ?05/23/2014 Initial Diagnosis  ? Carcinoma of left breast ? ?  ?Carcinoma of overlapping sites of left breast in female, estrogen receptor positive (HCC)  ? ?  ? ?INTERVAL HISTORY: ? ?Lindsay Fry 61 y.o.  female pleasant patient above history of stage I breast cancer ER PR positive HER-2 negative currently s/p adjuvant letrozole currently on surveillance is here for follow-up/. ? ?In the interim patient had a recent colonoscopy for ulcerative colitis.  Polyp status post resection. ? ?Otherwise patient denies any blood in stools or black-colored stools.  Complains of fatigue.   ? ? ? ?Review of Systems  ?Constitutional:  Positive for malaise/fatigue. Negative for chills, diaphoresis, fever and weight loss.  ?HENT:  Negative for nosebleeds and sore throat.   ?Eyes:  Negative for double vision.  ?Respiratory:  Negative for cough, hemoptysis, sputum production, shortness of breath and wheezing.   ?Cardiovascular:  Negative for chest pain, palpitations, orthopnea and leg swelling.  ?Gastrointestinal:  Negative for abdominal pain, blood in stool, constipation, heartburn, melena, nausea and vomiting.  ?Genitourinary:  Negative for dysuria, frequency and urgency.  ?Musculoskeletal:  Positive for back pain and joint pain.  ?Skin: Negative.  Negative for itching and rash.  ?Neurological:  Negative for dizziness, tingling, focal weakness, weakness and headaches.  ?Endo/Heme/Allergies:  Does not bruise/bleed easily.  ?Psychiatric/Behavioral:  Positive for depression. The patient is nervous/anxious and has insomnia.   ?  ? ?PAST MEDICAL HISTORY :  ?Past Medical History:  ?Diagnosis Date  ?? ADD (attention deficit disorder) 11/19/2012  ?? Allergic rhinitis, cause unspecified   ?? Anxiety state, unspecified   ?? Arthritis   ??   Asymptomatic postmenopausal status (age-related) (natural)   ?? Atypical hyperplasia of  left breast 2000  ?? Atypical mole 03/16/2018  ? R paraspinal mid upper back, excision  ?? Atypical mole 02/17/2018  ? R spinal upper back, moderate  ?? Atypical mole 08/11/2017  ? L sternum, excision  ?? Atypical mole 05/27/2017  ? L post neck, moderate  ?? Benign neoplasm of other and unspecified site of the digestive system   ?? Bipolar disorder ([place])   ?? Breast cancer ([place])   ? Left Breast 79m invasive CA left uiq, dcis left uoq and a lot ADH, ALH in both breasts.  ?? CAD (coronary artery disease)   ? a. 08/2014 NSTEMI/Cath: LM nl, LAD 50p, D1/D2 min irregs, LCX nl, OM1 40, LPDA nl, RI 99ost (2.068mvessel->med Rx), RCA nl, AM 60, nl EF; b. 11/2016 MV: EF 74%, no ischemia (performed 2/2 dyspnea & new inf TWI).  ?? [place] left breast ([place]  ?? Cataract   ?? Chronic diastolic CHF (congestive heart failure) ([facility] [place]  ? a. 08/2014 Echo: EF 60-65%, Gr 1 DD, nl LA.  ?? Clotting disorder ([facility]  ? on blood thiner, Plavix  ?? Cyst   ? on Achilles tendon  ?? Depression   ?? Diabetes mellitus ([facility]  ? frank  ?? Dysuria   ?? Edema   ?? Family history of malignant neoplasm of gastrointestinal tract   ?? Fibromyalgia   ?? GERD (gastroesophageal reflux disease)   ?? Glaucoma   ? pre glaucoma, 05/14/2018 pt. denies glaucoma  ?? Headache   ? migraines  ?? Hiatal hernia   ?? History of alcoholism ([facility]  ?? History of migraines   ?? History of ovarian cyst   ?? Hx of adenomatous colonic polyps   ?? Insomnia, unspecified   ?? Myocardial infarction ([facility]8-21-16  ?? OCD (obsessive compulsive disorder)   ?? Osteopenia 09/25/2016  ? Femoral neck T -1.1  9/18  ?? Other screening mammogram   ?? Pure hypercholesterolemia   ?? Rosacea   ?? Subacute confusional state 11/19/2012  ?? Tubular adenoma of colon 01/10/11  ?? Ulcerative colitis, unspecified   ?? Unspecified asthma(493.90)   ?? Unspecified essential hypertension   ?? Unspecified hypothyroidism   ?? Unspecified vitamin D deficiency   ?? Vertigo   ? ? ?PAST SURGICAL  HISTORY :   ?Past Surgical History:  ?Procedure Laterality Date  ?? APPENDECTOMY    ?? AXILLARY SENTINEL NODE BIOPSY Left 05/23/2014  ? Procedure: AXILLARY SENTINEL NODE BIOPSY;  Surgeon: SeChristene LyeMD;  Location: [facility]S;  Service: General;  Laterality: Left;  ?? BREAST BIOPSY  08/1998  ? on tamoxifen, atypical hyperplasia  ?? BREAST BIOPSY Left 04/2014  ?? BREAST SURGERY Left 08/1998  ? lumpectomy/ Dr CrSharlet Salina?? BREAST SURGERY Bilateral 05/23/2014  ? Mastectomy  ?? BUNIONECTOMY Right   ?? CARDIAC CATHETERIZATION N/A 09/05/2014  ? Procedure: Left Heart Cath and Coronary Angiography;  Surgeon: MuWellington HampshireMD;  [facility];  Service: Cardiovascular;  Laterality: N/A;  ?? CARDIAC CATHETERIZATION  09/05/2014  ?? CATARACT EXTRACTION W/ INTRAOCULAR LENS IMPLANT Bilateral   ?? CESAREAN SECTION  1996/1997  ? placenta previa, gest DM, pre-eclampsia  ?? CHOLECYSTECTOMY  1997  ? adhesions also  ?? COLONOSCOPY  01/2001  ? Ulcerative colitis  ?? COLONOSCOPY  04/2004  ? UC, polyp  ?? COLONOSCOPY  12/2006  ? UC, no polyps  ?? CORONARY STENT INTERVENTION N/A 04/06/2020  ?  Procedure: CORONARY STENT INTERVENTION;  Surgeon: Nelva Bush, MD;  Location: [place] [facility];  Service: Cardiovascular;  Laterality: N/A;  ?? DEXA  04/1999 and 2010  ? normal  ?? ESOPHAGOGASTRODUODENOSCOPY  09/2001  ? polyp  ?? exercise stress test  11/2003  ? negative  ?? FEMUR FRACTURE SURGERY Left   ?? LEFT HEART CATH AND CORONARY ANGIOGRAPHY N/A 04/06/2020  ? Procedure: LEFT HEART CATH AND CORONARY ANGIOGRAPHY;  Surgeon: Minna Merritts, MD;  Location: [place] [facility];  Service: Cardiovascular;  Laterality: N/A;  ?? MASTECTOMY Bilateral   ?? [place]  ?? NASAL SINUS SURGERY  05/1997  ?? nuclear stress test  06/2007  ? negative  ?? precancerous mole removed    ?? RIGHT AND LEFT HEART CATH    ? with 1 stent placed in March 2022  ?? SIMPLE MASTECTOMY WITH AXILLARY SENTINEL NODE  BIOPSY Bilateral 05/23/2014  ? Procedure: Bilateral simple mastectomy, left sentinel node biopsy ;  Surgeon: Christene Lye, MD;  Location: [facility];  Service: General;  Laterality: Bilateral;  ?? Sleep s

## 2021-05-24 ENCOUNTER — Other Ambulatory Visit: Payer: Self-pay | Admitting: Endocrinology

## 2021-05-24 DIAGNOSIS — E1169 Type 2 diabetes mellitus with other specified complication: Secondary | ICD-10-CM

## 2021-05-28 ENCOUNTER — Encounter: Payer: Self-pay | Admitting: Internal Medicine

## 2021-05-28 ENCOUNTER — Encounter: Payer: Self-pay | Admitting: Endocrinology

## 2021-05-28 DIAGNOSIS — E669 Obesity, unspecified: Secondary | ICD-10-CM

## 2021-05-28 NOTE — Progress Notes (Signed)
? ?Patient: Lindsay Fry ?Date of Birth: 1960/03/05 ? ?Reason for Visit: Follow up for memory and fibromyalgia ?History from: Patient ?Primary Neurologist: Dr. Jannifer Franklin ? ?ASSESSMENT AND PLAN ?61 y.o. year old female  ? ?1.  Mild cognitive impairment ?2.  Fibromyalgia ?3.  Migraine headache ?-We are not prescribing any medications, MOCA 28/30 ?-Continue to follow with PCP for management ?-Dr. Domingo Cocking manages headaches, which are doing great ?-Neuropsychological evaluation in 2014, not consistent with neurodegenerative process, was consistent with ADHD with associated significant clinical depression and moderate to severe anxiety ?-Return back here PRN ? ?HISTORY OF PRESENT ILLNESS: ?Today 05/29/21 ?Lindsay Fry is here today for follow-up.  Sees Dr. Domingo Cocking for headache management, no migraines in over 1 year. Has fatigue, takes Adderall. Has had sleep study, negative for OSA. Goes to bed at 10 PM. Feels forgetful, is stress related, her husband has PD. She sees psychiatry. Working to get good management of thyroid, adjustment. Feels less constipated. Does pool walking for 1 hour, 3 times a week. Is on disability now. MOCA was 28/30. In PT for gait, leg strengthening. Lindsay Fry looks good today.  ? ?HISTORY  ?08/30/2020 Dr. Jannifer Franklin: Lindsay Fry is a 61 year old right-handed white female with multiple medical issues and multiple complaints.  The patient has a history of central obesity, diabetes, fibromyalgia, ulcerative colitis, migraine headache, hypertension, anxiety and OCD issues, bipolar disease, ADD, low back pain, mild memory disturbance, and coronary artery disease.  The patient had a stent placed in March 2022, she has felt better since this.  She had a recent hemoglobin A1c of 5.9, but she indicates that she is not having any episodes of hypoglycemia.  She does report some occasional word finding issues, but otherwise she is functioning fairly well cognitively.  At times, she does not sleep well at night, she takes  Sonata every night for sleep.  Usually, she will have good energy levels throughout the day.  She returns to the office today for an evaluation.  She is getting no medications through this office. ? ?REVIEW OF SYSTEMS: Out of a complete 14 system review of symptoms, the patient complains only of the following symptoms, and all other reviewed systems are negative. ? ?See HPI ? ?ALLERGIES: ?Allergies  ?Allergen Reactions  ? Ephedrine Other (See Comments)  ?  Pt becomes hyper  ? Aspirin   ?  REACTION: aggrivates colitis ?Tolerates 81 mg but unable to take doses >81 mg  ? Crestor [Rosuvastatin]   ?  REACTION: increased lfts  ? Erythromycin   ?  REACTION: GI upset  ? Nsaids   ?  REACTION: aggrivate colitis  ? Oxycodone Other (See Comments)  ?  Panic Attack  ? ? ?HOME MEDICATIONS: ?Outpatient Medications Prior to Visit  ?Medication Sig Dispense Refill  ? acetaminophen (TYLENOL) 650 MG CR tablet Take 1,300 mg by mouth 2 (two) times daily.    ? amantadine (SYMMETREL) 100 MG capsule Take 100 mg by mouth 2 (two) times daily.    ? amphetamine-dextroamphetamine (ADDERALL XR) 15 MG 24 hr capsule Take 15 mg by mouth daily as needed.    ? Ascorbic Acid (VITAMIN C PO) Take 1,000 mg by mouth daily.    ? aspirin 81 MG chewable tablet Chew 1 tablet (81 mg total) by mouth daily. 90 tablet 3  ? atorvastatin (LIPITOR) 80 MG tablet TAKE 1 TABLET BY MOUTH ONCE EVERY EVENING 90 tablet 1  ? B Complex Vitamins (VITAMIN B COMPLEX PO) Take 1 tablet by mouth every  other day.    ? BAYER MICROLET LANCETS lancets USE AS DIRECTED TO CHECK BLOOD SUGAR TWICE DAILY 200 each 2  ? Blood Glucose Monitoring Suppl (CONTOUR NEXT MONITOR) w/Device KIT 1 each by Does not apply route in the morning and at bedtime. Use Contour Next meter to check blood sugar twice daily. 1 kit 0  ? Calcium Carb-Cholecalciferol (CALTRATE 600+D3 PO) Take 1 tablet by mouth daily. 300 mg Vitamin D3    ? Co-Enzyme Q-10 100 MG CAPS Take 100 mg by mouth daily.    ? cyclobenzaprine  (FLEXERIL) 5 MG tablet Take 1 tablet (5 mg total) by mouth every 8 (eight) hours as needed for muscle spasms. 20 tablet 0  ? dextroamphetamine (DEXEDRINE SPANSULE) 15 MG 24 hr capsule Take 15 mg by mouth as needed.    ? diazepam (VALIUM) 10 MG tablet Take 5-10 mg by mouth daily as needed for anxiety.    ? diphenhydrAMINE (BENADRYL) 25 MG tablet Take 50 mg by mouth at bedtime.    ? docusate sodium (COLACE) 100 MG capsule Take 100-300 mg by mouth daily as needed for mild constipation or moderate constipation.    ? ezetimibe (ZETIA) 10 MG tablet TAKE 1 TABLET(10 MG) BY MOUTH DAILY 90 tablet 3  ? famotidine (PEPCID) 40 MG tablet Take 40 mg by mouth 2 (two) times daily. 30 min. Before meal and at bedtime    ? fluticasone (FLONASE) 50 MCG/ACT nasal spray Place 1 spray into both nostrils daily. 16 g 6  ? gabapentin (NEURONTIN) 300 MG capsule TAKE 1 CAPSULE BY MOUTH 3 TIMES DAILY 90 capsule 11  ? glucose blood (CONTOUR NEXT TEST) test strip USE AS DIRECTED 100 strip 2  ? insulin aspart (NOVOLOG FLEXPEN) 100 UNIT/ML FlexPen Use 10 to 15 units when blood sugars are more than 200 15 mL 1  ? insulin detemir (LEVEMIR FLEXTOUCH) 100 UNIT/ML FlexPen Inject 60 Units into the skin every evening. 15 mL 5  ? Insulin Pen Needle (B-D UF III MINI PEN NEEDLES) 31G X 5 MM MISC USE FOR INJECTIONS TWICE DAILY 100 each 12  ? JARDIANCE 25 MG TABS tablet TAKE 1 TABLET(25 MG) BY MOUTH DAILY 30 tablet 3  ? ketoconazole (NIZORAL) 2 % cream Apply to affected areas one to two times daily as needed. 30 g 2  ? KRILL OIL PO Take by mouth.    ? lamoTRIgine (LAMICTAL) 200 MG tablet Take 400 mg by mouth daily.    ? latanoprost (XALATAN) 0.005 % ophthalmic solution Place 1 drop into both eyes 2 (two) times a week.     ? levothyroxine (SYNTHROID) 125 MCG tablet Take 1 tablet (125 mcg total) by mouth daily before breakfast. Take 2 tablets PO 6 days per week and take 3 tablets PO on day #7 (Mondays) 34 tablet 2  ? loperamide (IMODIUM A-D) 2 MG tablet Take 2  mg by mouth daily as needed for diarrhea or loose stools.    ? Melatonin 10 MG CAPS Take 10 mg by mouth at bedtime.    ? mesalamine (LIALDA) 1.2 g EC tablet TAKE 2 TABLETS BY MOUTH ONCE DAILY 180 tablet 1  ? metoprolol succinate (TOPROL-XL) 25 MG 24 hr tablet TAKE ONE-HALF TABLET BY MOUTH AS NEEDED AT BEDTIME FOR SYSTOLIC BLOOD PRESSURE GREATER THAN 100 45 tablet 3  ? Multiple Vitamin (MULTIVITAMIN) tablet Take 1 tablet by mouth daily. Woman 50 plus    ? nitroGLYCERIN (NITROSTAT) 0.4 MG SL tablet Place 1 tablet (0.4 mg total)  under the tongue every 5 (five) minutes as needed for chest pain. 25 tablet 4  ? olmesartan (BENICAR) 5 MG tablet Take 1 tablet (5 mg total) by mouth daily. 90 tablet 3  ? ondansetron (ZOFRAN-ODT) 4 MG disintegrating tablet DISSOLVE 1 TABLET(4 MG) ON THE TONGUE EVERY 6 HOURS AS NEEDED FOR NAUSEA OR VOMITING 30 tablet 0  ? pantoprazole (PROTONIX) 40 MG tablet TAKE 1 TABLET(40 MG) BY MOUTH TWICE DAILY 180 tablet 1  ? polyethylene glycol (MIRALAX / GLYCOLAX) 17 g packet Take 17 g by mouth 3 (three) times daily as needed for moderate constipation, mild constipation or severe constipation. In water or beverage    ? Probiotic Product (CULTURELLE PROBIOTICS PO) Take 1 capsule by mouth daily. For woman    ? QUEtiapine (SEROQUEL XR) 400 MG 24 hr tablet Take 800 mg by mouth at bedtime.    ? Semaglutide, 1 MG/DOSE, 4 MG/3ML SOPN Inject 1 mg into the skin once a week. Wednesdays 3 mL 2  ? simethicone (MYLICON) 80 MG chewable tablet Chew 80 mg by mouth 4 (four) times daily.    ? Tavaborole (KERYDIN) 5 % SOLN Apply 1 application topically at bedtime. Apply to affected area of left great toenail 10 mL 6  ? valACYclovir (VALTREX) 1000 MG tablet TAKE 1 TABLET BY MOUTH ONCE DAILY 30 tablet 5  ? VRAYLAR capsule Take 3 mg by mouth at bedtime.    ? zaleplon (SONATA) 10 MG capsule Take 10-20 mg by mouth at bedtime.    ? ?No facility-administered medications prior to visit.  ? ? ?PAST MEDICAL HISTORY: ?Past Medical  History:  ?Diagnosis Date  ? ADD (attention deficit disorder) 11/19/2012  ? Allergic rhinitis, cause unspecified   ? Anxiety state, unspecified   ? Arthritis   ? Asymptomatic postmenopausal status (age-rela

## 2021-05-29 ENCOUNTER — Ambulatory Visit: Payer: BC Managed Care – PPO | Admitting: Neurology

## 2021-05-29 ENCOUNTER — Other Ambulatory Visit (INDEPENDENT_AMBULATORY_CARE_PROVIDER_SITE_OTHER): Payer: BC Managed Care – PPO

## 2021-05-29 VITALS — BP 156/94 | HR 108 | Ht 66.0 in | Wt 206.0 lb

## 2021-05-29 DIAGNOSIS — E669 Obesity, unspecified: Secondary | ICD-10-CM | POA: Diagnosis not present

## 2021-05-29 DIAGNOSIS — E890 Postprocedural hypothyroidism: Secondary | ICD-10-CM | POA: Diagnosis not present

## 2021-05-29 DIAGNOSIS — E782 Mixed hyperlipidemia: Secondary | ICD-10-CM

## 2021-05-29 DIAGNOSIS — G3184 Mild cognitive impairment, so stated: Secondary | ICD-10-CM | POA: Diagnosis not present

## 2021-05-29 DIAGNOSIS — E1169 Type 2 diabetes mellitus with other specified complication: Secondary | ICD-10-CM

## 2021-05-30 LAB — HEMOGLOBIN A1C: Hgb A1c MFr Bld: 6.1 % (ref 4.6–6.5)

## 2021-05-30 LAB — TSH: TSH: 0.5 u[IU]/mL (ref 0.35–5.50)

## 2021-05-30 LAB — LDL CHOLESTEROL, DIRECT: Direct LDL: 72 mg/dL

## 2021-05-31 ENCOUNTER — Encounter: Payer: Self-pay | Admitting: Cardiovascular Disease

## 2021-05-31 MED ORDER — EMPAGLIFLOZIN 25 MG PO TABS: ORAL_TABLET | ORAL | 3 refills | Status: DC

## 2021-06-02 ENCOUNTER — Encounter: Payer: Self-pay | Admitting: Family Medicine

## 2021-06-04 ENCOUNTER — Other Ambulatory Visit: Payer: Self-pay

## 2021-06-04 ENCOUNTER — Ambulatory Visit (INDEPENDENT_AMBULATORY_CARE_PROVIDER_SITE_OTHER): Payer: BC Managed Care – PPO | Admitting: Endocrinology

## 2021-06-04 ENCOUNTER — Encounter: Payer: Self-pay | Admitting: Endocrinology

## 2021-06-04 VITALS — BP 138/90 | HR 92 | Ht 66.5 in | Wt 208.0 lb

## 2021-06-04 DIAGNOSIS — E782 Mixed hyperlipidemia: Secondary | ICD-10-CM | POA: Diagnosis not present

## 2021-06-04 DIAGNOSIS — E1169 Type 2 diabetes mellitus with other specified complication: Secondary | ICD-10-CM | POA: Diagnosis not present

## 2021-06-04 DIAGNOSIS — E890 Postprocedural hypothyroidism: Secondary | ICD-10-CM

## 2021-06-04 DIAGNOSIS — E559 Vitamin D deficiency, unspecified: Secondary | ICD-10-CM | POA: Diagnosis not present

## 2021-06-04 DIAGNOSIS — E669 Obesity, unspecified: Secondary | ICD-10-CM

## 2021-06-04 MED ORDER — OZEMPIC (2 MG/DOSE) 8 MG/3ML ~~LOC~~ SOPN: PEN_INJECTOR | SUBCUTANEOUS | 1 refills | Status: DC

## 2021-06-04 MED ORDER — OLMESARTAN MEDOXOMIL 5 MG PO TABS: 5.0000 mg | ORAL_TABLET | Freq: Every day | ORAL | 0 refills | Status: DC

## 2021-06-04 MED ORDER — ASPIRIN 81 MG PO CHEW
81.0000 mg | CHEWABLE_TABLET | Freq: Every day | ORAL | 0 refills | Status: AC
Start: 2021-06-04 — End: ?

## 2021-06-04 MED ORDER — VALACYCLOVIR HCL 1 G PO TABS: ORAL_TABLET | ORAL | 3 refills | Status: DC

## 2021-06-04 MED ORDER — GABAPENTIN 300 MG PO CAPS: ORAL_CAPSULE | ORAL | 3 refills | Status: DC

## 2021-06-04 NOTE — Progress Notes (Signed)
  Patient ID: Lindsay Fry, female   DOB: 10/26/1960, 60 y.o.   MRN: [id number]   Reason for Appointment:   followup visit  History of Present Illness:  DIABETES:  Prior history: With her high A1c of 6.7 in 03/2014 she had an abnormal glucose tolerance test indicating diabetes and 1 hour glucose of 300 She was started on metformin; however even with 1500 mg of metformin ER  blood sugars were not controlled especially fasting Subsequently with taking Janumet XR 100/1000 daily her blood sugars were much better Because of her weight gain and A1c going up to 7.3 along with occasional readings over 200 she was started on Victoza in 11/16 She has had progressive hyperglycemia previously and A1c was up to 8.6% in 4/17  She was started on Levemir insulin in 08/2015 because of progressive rise in blood sugars and inability to control with 3 other agents   Non-insulin hypoglycemic drugs: Ozempic 1 mg weekly; metformin ER 2000 mg daily, Jardiance 25 mg daily  INSULIN regimen: Levemir 60 units at bedtime  A1c is stable at 6.1   Current management, blood sugar patterns and problems identified: She feels like her morning blood sugars about 10-20 mg higher than before Has not checked any readings after dinner However she has been trying to cut back on sweets and desserts compared to last time  She only has diet drinks  Did not bring a monitor for download today Still unable to lose weight and her weight is up 4 pounds since last visit No symptoms of hypoglycemia She had not been able to exercise because of her pool closing but has started back very recently Has been regular with taking Ozempic 1 mg  She thinks she has some morning nausea but this is not new Has been on the same dose of Levemir for some time  GLUCOSE readings from recall     PRE-MEAL Fasting Lunch Dinner Bedtime Overall  Glucose range: 130-140      Mean/median:        Prior   PRE-MEAL Fasting Lunch Dinner  Bedtime Overall  Glucose range: 91-157      Mean/median: 127    124   POST-MEAL PC Breakfast PC Lunch PC Dinner  Glucose range:   138-184  Mean/median:       Previous glucose range 92-158 before first meal with AVERAGE about 120  Weight history:  Wt Readings from Last 3 Encounters:  06/04/21 208 lb (94.3 kg)  05/29/21 206 lb (93.4 kg)  05/23/21 211 lb 3.2 oz (95.8 kg)   Previous consultation with dietitian: 2016    LABS:  Lab Results  Component Value Date   HGBA1C 6.1 05/29/2021   HGBA1C 6.2 02/12/2021   HGBA1C 6.0 10/12/2020   Lab Results  Component Value Date   MICROALBUR <0.7 10/12/2020   LDLCALC 77 02/12/2021   CREATININE 0.72 05/23/2021   HYPOTHYROIDISM: This was first diagnosed in 1992 after her treatment for Graves' disease with I-131 She has been on relatively large doses of thyroxine supplements She was on 224 mcg since 11/13 and her TSH was normal in 3/14 Subjectively difficult to assess her thyroid since she tends to have fatigue chronically.  In 2014 her dose was reduced to 200 mcg daily and her dose has been fluctuating since then She had required periodic increase in dosage including in 05/2014 when her TSH was 20  She is on 250 g dosage, continues on the brand name LEVOXYL, this is less expensive   than Synthroid  She feels fairly good overall  She has been regular with the Levoxyl every day before breakfast on empty stomach  Since her TSH was high about 3 weeks ago she is not taking an extra 125 mcg on Mondays of her Levoxyl         TSH as follows  Lab Results  Component Value Date   TSH 0.50 05/29/2021   TSH 6.93 (H) 02/12/2021   TSH 3.53 10/12/2020   FREET4 0.73 02/12/2021   FREET4 1.04 10/12/2020   FREET4 0.58 (L) 06/05/2020     Allergies as of 06/04/2021       Reactions   Ephedrine Other (See Comments)   Pt becomes hyper   Aspirin    REACTION: aggrivates colitis Tolerates 81 mg but unable to take doses >81 mg   Crestor  [rosuvastatin]    REACTION: increased lfts   Erythromycin    REACTION: GI upset   Nsaids    REACTION: aggrivate colitis   Oxycodone Other (See Comments)   Panic Attack        Medication List        Accurate as of Jun 04, 2021  1:40 PM. If you have any questions, ask your nurse or doctor.          acetaminophen 650 MG CR tablet Commonly known as: TYLENOL Take 1,300 mg by mouth 2 (two) times daily.   amantadine 100 MG capsule Commonly known as: SYMMETREL Take 100 mg by mouth 2 (two) times daily.   amphetamine-dextroamphetamine 15 MG 24 hr capsule Commonly known as: ADDERALL XR Take 15 mg by mouth daily as needed.   aspirin 81 MG chewable tablet Chew 1 tablet (81 mg total) by mouth daily.   atorvastatin 80 MG tablet Commonly known as: LIPITOR TAKE 1 TABLET BY MOUTH ONCE EVERY EVENING   B-D UF III MINI PEN NEEDLES 31G X 5 MM Misc Generic drug: Insulin Pen Needle USE FOR INJECTIONS TWICE DAILY   Bayer Microlet Lancets lancets USE AS DIRECTED TO CHECK BLOOD SUGAR TWICE DAILY   CALTRATE 600+D3 PO Take 1 tablet by mouth daily. 300 mg Vitamin D3   Co-Enzyme Q-10 100 MG Caps Take 100 mg by mouth daily.   Contour Next Monitor w/Device Kit 1 each by Does not apply route in the morning and at bedtime. Use Contour Next meter to check blood sugar twice daily.   Contour Next Test test strip Generic drug: glucose blood USE AS DIRECTED   CULTURELLE PROBIOTICS PO Take 1 capsule by mouth daily. For woman   cyclobenzaprine 5 MG tablet Commonly known as: FLEXERIL Take 1 tablet (5 mg total) by mouth every 8 (eight) hours as needed for muscle spasms.   dextroamphetamine 15 MG 24 hr capsule Commonly known as: DEXEDRINE SPANSULE Take 15 mg by mouth as needed.   diazepam 10 MG tablet Commonly known as: VALIUM Take 5-10 mg by mouth daily as needed for anxiety.   diphenhydrAMINE 25 MG tablet Commonly known as: BENADRYL Take 50 mg by mouth at bedtime.   docusate  sodium 100 MG capsule Commonly known as: COLACE Take 100-300 mg by mouth daily as needed for mild constipation or moderate constipation.   empagliflozin 25 MG Tabs tablet Commonly known as: Jardiance TAKE 1 TABLET(25 MG) BY MOUTH DAILY   ezetimibe 10 MG tablet Commonly known as: ZETIA TAKE 1 TABLET(10 MG) BY MOUTH DAILY   famotidine 40 MG tablet Commonly known as: PEPCID Take 40 mg by mouth 2 (  two) times daily. 30 min. Before meal and at bedtime   fluticasone 50 MCG/ACT nasal spray Commonly known as: FLONASE Place 1 spray into both nostrils daily.   gabapentin 300 MG capsule Commonly known as: NEURONTIN TAKE 1 CAPSULE BY MOUTH 3 TIMES DAILY   ketoconazole 2 % cream Commonly known as: NIZORAL Apply to affected areas one to two times daily as needed.   KRILL OIL PO Take by mouth.   lamoTRIgine 200 MG tablet Commonly known as: LAMICTAL Take 400 mg by mouth daily.   latanoprost 0.005 % ophthalmic solution Commonly known as: XALATAN Place 1 drop into both eyes 2 (two) times a week.   Levemir FlexTouch 100 UNIT/ML FlexPen Generic drug: insulin detemir Inject 60 Units into the skin every evening.   levothyroxine 125 MCG tablet Commonly known as: SYNTHROID Take 1 tablet (125 mcg total) by mouth daily before breakfast. Take 2 tablets PO 6 days per week and take 3 tablets PO on day #7 (Mondays)   loperamide 2 MG tablet Commonly known as: IMODIUM A-D Take 2 mg by mouth daily as needed for diarrhea or loose stools.   Melatonin 10 MG Caps Take 10 mg by mouth at bedtime.   mesalamine 1.2 g EC tablet Commonly known as: LIALDA TAKE 2 TABLETS BY MOUTH ONCE DAILY   metoprolol succinate 25 MG 24 hr tablet Commonly known as: TOPROL-XL TAKE ONE-HALF TABLET BY MOUTH AS NEEDED AT BEDTIME FOR SYSTOLIC BLOOD PRESSURE GREATER THAN 100   multivitamin tablet Take 1 tablet by mouth daily. Woman 50 plus   nitroGLYCERIN 0.4 MG SL tablet Commonly known as: NITROSTAT Place 1 tablet  (0.4 mg total) under the tongue every 5 (five) minutes as needed for chest pain.   NovoLOG FlexPen 100 UNIT/ML FlexPen Generic drug: insulin aspart Use 10 to 15 units when blood sugars are more than 200   olmesartan 5 MG tablet Commonly known as: BENICAR Take 1 tablet (5 mg total) by mouth daily.   ondansetron 4 MG disintegrating tablet Commonly known as: ZOFRAN-ODT DISSOLVE 1 TABLET(4 MG) ON THE TONGUE EVERY 6 HOURS AS NEEDED FOR NAUSEA OR VOMITING   pantoprazole 40 MG tablet Commonly known as: PROTONIX TAKE 1 TABLET(40 MG) BY MOUTH TWICE DAILY   polyethylene glycol 17 g packet Commonly known as: MIRALAX / GLYCOLAX Take 17 g by mouth 3 (three) times daily as needed for moderate constipation, mild constipation or severe constipation. In water or beverage   QUEtiapine 400 MG 24 hr tablet Commonly known as: SEROQUEL XR Take 800 mg by mouth at bedtime.   Semaglutide (1 MG/DOSE) 4 MG/3ML Sopn Inject 1 mg into the skin once a week. Wednesdays   simethicone 80 MG chewable tablet Commonly known as: MYLICON Chew 80 mg by mouth 4 (four) times daily.   Tavaborole 5 % Soln Commonly known as: Kerydin Apply 1 application topically at bedtime. Apply to affected area of left great toenail   valACYclovir 1000 MG tablet Commonly known as: VALTREX TAKE 1 TABLET BY MOUTH ONCE DAILY   VITAMIN B COMPLEX PO Take 1 tablet by mouth every other day.   VITAMIN C PO Take 1,000 mg by mouth daily.   Vraylar 3 MG capsule Generic drug: cariprazine Take 3 mg by mouth at bedtime.   zaleplon 10 MG capsule Commonly known as: SONATA Take 10-20 mg by mouth at bedtime.        Allergies:  Allergies  Allergen Reactions   Ephedrine Other (See Comments)    Pt   becomes hyper   Aspirin     REACTION: aggrivates colitis Tolerates 81 mg but unable to take doses >81 mg   Crestor [Rosuvastatin]     REACTION: increased lfts   Erythromycin     REACTION: GI upset   Nsaids     REACTION: aggrivate  colitis   Oxycodone Other (See Comments)    Panic Attack    Past Medical History:  Diagnosis Date   ADD (attention deficit disorder) 11/19/2012   Allergic rhinitis, cause unspecified    Anxiety state, unspecified    Arthritis    Asymptomatic postmenopausal status (age-related) (natural)    Atypical hyperplasia of left breast 2000   Atypical mole 03/16/2018   R paraspinal mid upper back, excision   Atypical mole 02/17/2018   R spinal upper back, moderate   Atypical mole 08/11/2017   L sternum, excision   Atypical mole 05/27/2017   L post neck, moderate   Benign neoplasm of other and unspecified site of the digestive system    Bipolar disorder (HCC)    Breast cancer (HCC)    Left Breast 6mm invasive CA left uiq, dcis left uoq and a lot ADH, ALH in both breasts.   CAD (coronary artery disease)    a. 08/2014 NSTEMI/Cath: LM nl, LAD 50p, D1/D2 min irregs, LCX nl, OM1 40, LPDA nl, RI 99ost (2.0mm vessel->med Rx), RCA nl, AM 60, nl EF; b. 11/2016 MV: EF 74%, no ischemia (performed 2/2 dyspnea & new inf TWI).   Carcinoma of left breast (HCC) 06/02/2014   Cataract    Chronic diastolic CHF (congestive heart failure) (HCC)    a. 08/2014 Echo: EF 60-65%, Gr 1 DD, nl LA.   Clotting disorder (HCC)    on blood thiner, Plavix   Cyst    on Achilles tendon   Depression    Diabetes mellitus (HCC)    frank   Dysuria    Edema    Family history of malignant neoplasm of gastrointestinal tract    Fibromyalgia    GERD (gastroesophageal reflux disease)    Glaucoma    pre glaucoma, 05/14/2018 pt. denies glaucoma   Headache    migraines   Hiatal hernia    History of alcoholism (HCC)    History of migraines    History of ovarian cyst    Hx of adenomatous colonic polyps    Insomnia, unspecified    Myocardial infarction (HCC) 09-04-14   OCD (obsessive compulsive disorder)    Osteopenia 09/25/2016   Femoral neck T -1.1  9/18   Other screening mammogram    Pure hypercholesterolemia    Rosacea     Subacute confusional state 11/19/2012   Tubular adenoma of colon 01/10/11   Ulcerative colitis, unspecified    Unspecified asthma(493.90)    Unspecified essential hypertension    Unspecified hypothyroidism    Unspecified vitamin D deficiency    Vertigo     Past Surgical History:  Procedure Laterality Date   APPENDECTOMY     AXILLARY SENTINEL NODE BIOPSY Left 05/23/2014   Procedure: AXILLARY SENTINEL NODE BIOPSY;  Surgeon: Seeplaputhur G Sankar, MD;  Location: [facility];  Service: General;  Laterality: Left;   BREAST BIOPSY  08/1998   on tamoxifen, atypical hyperplasia   BREAST BIOPSY Left 04/2014   BREAST SURGERY Left 08/1998   lumpectomy/ Dr Crawford   BREAST SURGERY Bilateral 05/23/2014   Mastectomy   BUNIONECTOMY Right    CARDIAC CATHETERIZATION N/A 09/05/2014   Procedure: Left Heart Cath and   Coronary Angiography;  Surgeon: Wellington Hampshire, MD;  Location: [place] [facility];  Service: Cardiovascular;  Laterality: N/A;   CARDIAC CATHETERIZATION  09/05/2014   CATARACT EXTRACTION W/ INTRAOCULAR LENS IMPLANT Bilateral    CESAREAN SECTION  1996/1997   placenta previa, gest DM, pre-eclampsia   CHOLECYSTECTOMY  1997   adhesions also   COLONOSCOPY  01/2001   Ulcerative colitis   COLONOSCOPY  04/2004   UC, polyp   COLONOSCOPY  12/2006   UC, no polyps   CORONARY STENT INTERVENTION N/A 04/06/2020   Procedure: CORONARY STENT INTERVENTION;  Surgeon: Nelva Bush, MD;  Location: [place] [facility];  Service: Cardiovascular;  Laterality: N/A;   DEXA  04/1999 and 2010   normal   ESOPHAGOGASTRODUODENOSCOPY  09/2001   polyp   exercise stress test  11/2003   negative   FEMUR FRACTURE SURGERY Left    LEFT HEART CATH AND CORONARY ANGIOGRAPHY N/A 04/06/2020   Procedure: LEFT HEART CATH AND CORONARY ANGIOGRAPHY;  Surgeon: Minna Merritts, MD;  Location: [place] [facility];  Service: Cardiovascular;  Laterality: N/A;   MASTECTOMY Bilateral    MECKEL DIVERTICULUM EXCISION  1999    NASAL SINUS SURGERY  05/1997   nuclear stress test  06/2007   negative   precancerous mole removed     RIGHT AND LEFT HEART CATH     with 1 stent placed in March 2022   SIMPLE MASTECTOMY WITH AXILLARY SENTINEL NODE BIOPSY Bilateral 05/23/2014   Procedure: Bilateral simple mastectomy, left sentinel node biopsy ;  Surgeon: Christene Lye, MD;  Location: [facility];  Service: General;  Laterality: Bilateral;   Sleep study  11/2007   no apnea, but did snore (done by [facility])   [place] EXTRACTION  92's    Family History  Problem Relation Age of Onset   Coronary artery disease Father    Colon cancer Father    Alzheimer's disease Father    Dementia Father    Colon polyps Father    Coronary artery disease Mother    Hypertension Mother    Osteoporosis Mother    Crohn's disease Mother        crohns colitis   Breast cancer Other        great aunts   Coronary artery disease Other        Uncle (also AAA)   Diabetes Other        remote family history   Stroke Cousin    Esophageal cancer Neg Hx    Rectal cancer Neg Hx    Stomach cancer Neg Hx     Social History:  reports that she quit smoking about 28 years ago. Her smoking use included cigarettes. She has a 1.25 pack-year smoking history. She has never used smokeless tobacco. She reports that she does not drink alcohol and does not use drugs.  REVIEW Of SYSTEMS:  Bone health: She is taking OTC vitamin D3, ? 2000 units and has been on calcium recommended by her oncologist Vitamin D level has been normal  Bone density in 9/15 shows T score -1.1 at the hip  Has variable calcium levels  Lab Results  Component Value Date   CALCIUM 8.7 (L) 05/23/2021   PHOS 2.3 01/29/2007   Lab Results  Component Value Date   VD25OH 43.86 03/24/2020   VD25OH 41.98 06/03/2019   VD25OH 39.94 04/21/2019     HYPERTENSION: Blood pressure  is managed by cardiologist.   She is also on  Jardiance  Lab Results  Component Value Date   CREATININE 0.72 05/23/2021   BUN 20 05/23/2021   NA 135 05/23/2021   K 4.1 05/23/2021   CL 104 05/23/2021   CO2 24 05/23/2021    History of hypercholesterolemia with known CAD: Management by cardiologist and has been on 80 mg atorvastatin since her MI She is also taking Zetia that was added subsequently Repatha was approved but she was told by cardiologist not to start this  Although she thinks her diet is somewhat better her LDL is still over 70 Has been taking her medication regularly   Lab Results  Component Value Date   CHOL 166 02/12/2021   CHOL 122 06/14/2020   CHOL 120 03/07/2020   Lab Results  Component Value Date   HDL 50.70 02/12/2021   HDL 45.80 06/14/2020   HDL 40.30 03/07/2020   Lab Results  Component Value Date   LDLCALC 77 02/12/2021   LDLCALC 55 06/14/2020   LDLCALC 46 03/07/2020   Lab Results  Component Value Date   TRIG 194.0 (H) 02/12/2021   TRIG 106.0 06/14/2020   TRIG 172.0 (H) 03/07/2020   Lab Results  Component Value Date   CHOLHDL 3 02/12/2021   CHOLHDL 3 06/14/2020   CHOLHDL 3 03/07/2020   Lab Results  Component Value Date   LDLDIRECT 72.0 05/29/2021   LDLDIRECT 125.0 02/07/2020   LDLDIRECT 109.0 12/13/2019     ?  Fatty liver: Liver functions show variable results   Lab Results  Component Value Date   ALT 41 05/23/2021      Examination:   BP 138/90   Pulse 92   Ht 5' 6.5" (1.689 m)   Wt 208 lb (94.3 kg)   LMP 08/23/2006 (Approximate) Comment: tubal ligation  SpO2 95%   BMI 33.07 kg/m       Assessments   DIABETES with obesity:  See history of present illness for evaluation of  current management, blood sugar patterns and problems identified  Her A1c is stable at 6.1   She is on a regimen of basal insulin using 60 units of Levemir, Jardiance 25 mg and Ozempic 1 mg  She has a tendency to weight gain and this may be partly from not exercising However may  benefit from higher dose of Ozempic and will try 2 mg weekly She will let us know if she has any excessive nausea with this  Likely can continue the same dose of Levemir unless morning sugars range Discussed need to check blood sugars periodically after dinner even if it is 1 hour later She will continue to try and improve her diet Also will benefit from the exercise that she has started now  Hypothyroidism, post ablative:  TSH is back to normal, usually symptoms not correlating with her thyroid levels  Continue 250 mg of LEVOXYL daily with extra 125 mcg once a week  LIPIDS: She still has relatively high LDL and will start Repatha instead of Zetia She will let us know if she has any difficulties with this   Follow-up in 4 months      There are no Patient Instructions on file for this visit.    .    06/04/2021, 1:40 PM   Note: This office note was prepared with Dragon voice recognition system technology. Any transcriptional errors that result from this process are unintentional.      

## 2021-06-04 NOTE — Patient Instructions (Signed)
Repatha every 2 weeks Stop Zetia when out  Check blood sugars on waking up 4 days a week  Also check blood sugars about 2 hours after meals and do this after different meals by rotation  Recommended blood sugar levels on waking up are 90-130 and about 2 hours after meal is 130-160  Please bring your blood sugar monitor to each visit, thank you

## 2021-06-08 ENCOUNTER — Telehealth: Payer: Self-pay

## 2021-06-08 ENCOUNTER — Encounter: Payer: Self-pay | Admitting: Internal Medicine

## 2021-06-08 DIAGNOSIS — R110 Nausea: Secondary | ICD-10-CM

## 2021-06-08 MED ORDER — ONDANSETRON 4 MG PO TBDP: ORAL_TABLET | ORAL | 1 refills | Status: DC

## 2021-06-08 NOTE — Telephone Encounter (Signed)
Zofran refilled

## 2021-06-14 ENCOUNTER — Encounter: Payer: Self-pay | Admitting: Cardiovascular Disease

## 2021-07-02 ENCOUNTER — Other Ambulatory Visit: Payer: Self-pay | Admitting: Endocrinology

## 2021-08-06 ENCOUNTER — Encounter: Payer: Self-pay | Admitting: Internal Medicine

## 2021-08-09 ENCOUNTER — Other Ambulatory Visit: Payer: Self-pay | Admitting: Endocrinology

## 2021-08-09 DIAGNOSIS — E890 Postprocedural hypothyroidism: Secondary | ICD-10-CM

## 2021-08-23 ENCOUNTER — Other Ambulatory Visit: Payer: Self-pay | Admitting: Cardiovascular Disease

## 2021-09-02 NOTE — Progress Notes (Unsigned)
Date:  09/03/2021   ID:  Lindsay Fry, DOB 04-03-1960, MRN [id number]  Patient Location:  [place] [id number]   Provider location:   [facility], [place] office  PCP:  Abner Greenspan, MD  Cardiologist:  Arvid Right [facility]   Chief Complaint  Patient presents with   6 month follow up     "Doing well." Medications reviewed by the patient verbally.     History of Present Illness:    Lindsay Fry is a 61 y.o. female  past medical history of Morbid obesity,  CAD Severe ostial ramus disease treated medically by catheterization August 2016, Cardiac catheterization April 06, 2020,  underwent stenting of OM branch hypertension,  hyperlipidemia,   previous symptoms of chest pain and shortness of breath.  Migraines/bipolar I Chronic tachycardia Cardiac catheterization March 2022 stent placed to OM branch She presents today for follow-up of her coronary artery disease, hypertension, hyperlipidemia, cardiac catheterization with stent placement  Last seen by myself in clinic September 2022 Doing water therapy,, since march  No SOB, no chest pain on exertion  On ozempic, feels her weight is trending down  Little bit of wheezing, getting over bronchitis Has not started repatha injection, has it at home Interested to see how her cholesterol numbers go before starting Repatha  Periods where her diet is poor, lots of pretzels, carbs  Cardiac catheterization April 06, 2020 Severe OM disease, likely culprit vessel Chronic occlusion of small ramus branch Moderate to severe disease of proximal diagonal #2  Mild to moderate proximal LAD disease underwent stenting of OM branch  Sleep disorder, attributes to bipolar Feels her bipolar is well controlled  EKG personally reviewed by myself on todays visit Normal sinus rhythm rate 92 bpm no significant ST abnormality, there is T wave inversions 1 and aVL   Past Medical  History:  Diagnosis Date   ADD (attention deficit disorder) 11/19/2012   Allergic rhinitis, cause unspecified    Anxiety state, unspecified    Arthritis    Asymptomatic postmenopausal status (age-related) (natural)    Atypical hyperplasia of left breast 2000   Atypical mole 03/16/2018   R paraspinal mid upper back, excision   Atypical mole 02/17/2018   R spinal upper back, moderate   Atypical mole 08/11/2017   L sternum, excision   Atypical mole 05/27/2017   L post neck, moderate   Benign neoplasm of other and unspecified site of the digestive system    Bipolar disorder ([place])    Breast cancer ([place])    Left Breast 84m invasive CA left uiq, dcis left uoq and a lot ADH, ALH in both breasts.   CAD (coronary artery disease)    a. 08/2014 NSTEMI/Cath: LM nl, LAD 50p, D1/D2 min irregs, LCX nl, OM1 40, LPDA nl, RI 99ost (2.018mvessel->med Rx), RCA nl, AM 60, nl EF; b. 11/2016 MV: EF 74%, no ischemia (performed 2/2 dyspnea & new inf TWI).   Carcinoma of left breast ([facility]/19/2016   Cataract    Chronic diastolic CHF (congestive heart failure) ([place]   a. 08/2014 Echo: EF 60-65%, Gr 1 DD, nl LA.   Clotting disorder ([place]   on blood thiner, Plavix   Cyst    on Achilles tendon   Depression    Diabetes mellitus ([place]   frank   Dysuria    Edema    Family history of malignant neoplasm of gastrointestinal tract  Fibromyalgia    GERD (gastroesophageal reflux disease)    Glaucoma    pre glaucoma, 05/14/2018 pt. denies glaucoma   Headache    migraines   Hiatal hernia    History of alcoholism ([place])    History of migraines    History of ovarian cyst    Hx of adenomatous colonic polyps    Insomnia, unspecified    Myocardial infarction ([place]) 09-04-14   OCD (obsessive compulsive disorder)    Osteopenia 09/25/2016   Femoral neck T -1.1  9/18   Other screening mammogram    Pure hypercholesterolemia    Rosacea    Subacute confusional state 11/19/2012   Tubular adenoma of colon 01/10/11    Ulcerative colitis, unspecified    Unspecified asthma(493.90)    Unspecified essential hypertension    Unspecified hypothyroidism    Unspecified vitamin D deficiency    Vertigo    Past Surgical History:  Procedure Laterality Date   APPENDECTOMY     AXILLARY SENTINEL NODE BIOPSY Left 05/23/2014   Procedure: AXILLARY SENTINEL NODE BIOPSY;  Surgeon: Christene Lye, MD;  Location: [facility] ORS;  Service: General;  Laterality: Left;   BREAST BIOPSY  08/1998   on tamoxifen, atypical hyperplasia   BREAST BIOPSY Left 04/2014   BREAST SURGERY Left 08/1998   lumpectomy/ Dr Sharlet Salina   BREAST SURGERY Bilateral 05/23/2014   Mastectomy   BUNIONECTOMY Right    CARDIAC CATHETERIZATION N/A 09/05/2014   Procedure: Left Heart Cath and Coronary Angiography;  Surgeon: Wellington Hampshire, MD;  Location: [place] [facility];  Service: Cardiovascular;  Laterality: N/A;   CARDIAC CATHETERIZATION  09/05/2014   CATARACT EXTRACTION W/ INTRAOCULAR LENS IMPLANT Bilateral    CESAREAN SECTION  1996/1997   placenta previa, gest DM, pre-eclampsia   CHOLECYSTECTOMY  1997   adhesions also   COLONOSCOPY  01/2001   Ulcerative colitis   COLONOSCOPY  04/2004   UC, polyp   COLONOSCOPY  12/2006   UC, no polyps   CORONARY STENT INTERVENTION N/A 04/06/2020   Procedure: CORONARY STENT INTERVENTION;  Surgeon: Nelva Bush, MD;  Location: [place] [facility];  Service: Cardiovascular;  Laterality: N/A;   DEXA  04/1999 and 2010   normal   ESOPHAGOGASTRODUODENOSCOPY  09/2001   polyp   exercise stress test  11/2003   negative   FEMUR FRACTURE SURGERY Left    LEFT HEART CATH AND CORONARY ANGIOGRAPHY N/A 04/06/2020   Procedure: LEFT HEART CATH AND CORONARY ANGIOGRAPHY;  Surgeon: Minna Merritts, MD;  Location: [place] [facility];  Service: Cardiovascular;  Laterality: N/A;   MASTECTOMY Bilateral    MECKEL DIVERTICULUM EXCISION  1999   NASAL SINUS SURGERY  05/1997   nuclear stress test  06/2007   negative    precancerous mole removed     RIGHT AND LEFT HEART CATH     with 1 stent placed in March 2022   SIMPLE MASTECTOMY WITH AXILLARY SENTINEL NODE BIOPSY Bilateral 05/23/2014   Procedure: Bilateral simple mastectomy, left sentinel node biopsy ;  Surgeon: Christene Lye, MD;  Location: [facility] ORS;  Service: General;  Laterality: Bilateral;   Sleep study  11/2007   no apnea, but did snore (done by [facility])   [place] EXTRACTION  1980's     Allergies:   Ephedrine, Oxycodone, Aspirin, Erythromycin, Nsaids, Other, and Rosuvastatin   Social History   Tobacco Use   Smoking status: Former  Packs/day: 0.25    Years: 5.00    Total pack years: 1.25    Types: Cigarettes    Quit date: 01/14/1993    Years since quitting: 28.6   Smokeless tobacco: Never  Vaping Use   Vaping Use: Never used  Substance Use Topics   Alcohol use: No    Alcohol/week: 0.0 standard drinks of alcohol    Comment: Recovered ETOH   Drug use: No     Current Outpatient Medications on File Prior to Visit  Medication Sig Dispense Refill   acetaminophen (TYLENOL) 650 MG CR tablet Take 1,300 mg by mouth 2 (two) times daily.     albuterol (VENTOLIN HFA) 108 (90 Base) MCG/ACT inhaler INHALE 1 PUFF INTO THE LUNGS EVERY 4 HOURS AS NEEDED FOR WHEEZING     amantadine (SYMMETREL) 100 MG capsule Take 100 mg by mouth 2 (two) times daily.     Ascorbic Acid (VITAMIN C PO) Take 1,000 mg by mouth daily.     aspirin 81 MG chewable tablet Chew 1 tablet (81 mg total) by mouth daily. 90 tablet 0   atorvastatin (LIPITOR) 80 MG tablet TAKE 1 TABLET BY MOUTH ONCE EVERY EVENING 90 tablet 0   B Complex Vitamins (VITAMIN B COMPLEX PO) Take 1 tablet by mouth every other day.     Calcium Carb-Cholecalciferol (CALTRATE 600+D3 PO) Take 1 tablet by mouth daily. 300 mg Vitamin D3     Co-Enzyme Q-10 100 MG CAPS Take 100 mg by mouth daily.     cyclobenzaprine (FLEXERIL) 5 MG tablet Take 1 tablet (5 mg  total) by mouth every 8 (eight) hours as needed for muscle spasms. 20 tablet 0   dextroamphetamine (DEXEDRINE SPANSULE) 15 MG 24 hr capsule Take 15 mg by mouth as needed.     diazepam (VALIUM) 10 MG tablet Take 5-10 mg by mouth daily as needed for anxiety.     diphenhydrAMINE (BENADRYL) 25 MG tablet Take 50 mg by mouth at bedtime.     docusate sodium (COLACE) 100 MG capsule Take 100-300 mg by mouth daily as needed for mild constipation or moderate constipation.     empagliflozin (JARDIANCE) 25 MG TABS tablet TAKE 1 TABLET(25 MG) BY MOUTH DAILY 90 tablet 3   ezetimibe (ZETIA) 10 MG tablet TAKE 1 TABLET(10 MG) BY MOUTH DAILY 90 tablet 3   famotidine (PEPCID) 40 MG tablet Take 40 mg by mouth 2 (two) times daily. 30 min. Before meal and at bedtime     fluticasone (FLONASE) 50 MCG/ACT nasal spray Place 1 spray into both nostrils daily. 16 g 6   gabapentin (NEURONTIN) 300 MG capsule TAKE 1 CAPSULE BY MOUTH 3 TIMES DAILY 270 capsule 3   glucose blood (CONTOUR NEXT TEST) test strip USE AS DIRECTED 100 strip 2   insulin aspart (NOVOLOG FLEXPEN) 100 UNIT/ML FlexPen Use 10 to 15 units when blood sugars are more than 200 15 mL 1   insulin detemir (LEVEMIR FLEXTOUCH) 100 UNIT/ML FlexPen Inject 60 Units into the skin every evening. 15 mL 5   ketoconazole (NIZORAL) 2 % cream Apply to affected areas one to two times daily as needed. 30 g 2   KRILL OIL PO Take by mouth.     lamoTRIgine (LAMICTAL) 200 MG tablet Take 400 mg by mouth daily.     latanoprost (XALATAN) 0.005 % ophthalmic solution Place 1 drop into both eyes 2 (two) times a week.      LEVOXYL 125 MCG tablet TAKE 2 TABLETS BY MOUTH ONCE  DAILY BEFORE BREAKFAST 100 tablet 1   loperamide (IMODIUM A-D) 2 MG tablet Take 2 mg by mouth daily as needed for diarrhea or loose stools.     Melatonin 10 MG CAPS Take 10 mg by mouth at bedtime.     mesalamine (LIALDA) 1.2 g EC tablet TAKE 2 TABLETS BY MOUTH ONCE DAILY 180 tablet 1   metoprolol succinate (TOPROL-XL) 25  MG 24 hr tablet Take 12.5 mg by mouth daily.     Multiple Vitamin (MULTIVITAMIN) tablet Take 1 tablet by mouth daily. Woman 50 plus     nitroGLYCERIN (NITROSTAT) 0.4 MG SL tablet Place 1 tablet (0.4 mg total) under the tongue every 5 (five) minutes as needed for chest pain. 25 tablet 4   olmesartan (BENICAR) 5 MG tablet Take 1 tablet (5 mg total) by mouth daily. 90 tablet 0   ondansetron (ZOFRAN-ODT) 4 MG disintegrating tablet DISSOLVE 1 TABLET(4 MG) ON THE TONGUE EVERY 6 HOURS AS NEEDED FOR NAUSEA OR VOMITING 90 tablet 1   pantoprazole (PROTONIX) 40 MG tablet TAKE 1 TABLET(40 MG) BY MOUTH TWICE DAILY 180 tablet 1   polyethylene glycol (MIRALAX / GLYCOLAX) 17 g packet Take 17 g by mouth 3 (three) times daily as needed for moderate constipation, mild constipation or severe constipation. In water or beverage     Probiotic Product (CULTURELLE PROBIOTICS PO) Take 1 capsule by mouth daily. For woman     QUEtiapine (SEROQUEL XR) 400 MG 24 hr tablet Take 800 mg by mouth at bedtime.     Semaglutide, 2 MG/DOSE, (OZEMPIC, 2 MG/DOSE,) 8 MG/3ML SOPN INJECT 2 GRAMS SUBCUTANEOUSLY ONCE A WEEK 3 mL 1   simethicone (MYLICON) 80 MG chewable tablet Chew 80 mg by mouth 4 (four) times daily.     valACYclovir (VALTREX) 1000 MG tablet TAKE 1 TABLET BY MOUTH ONCE DAILY 90 tablet 3   VRAYLAR capsule Take 3 mg by mouth at bedtime.     zaleplon (SONATA) 10 MG capsule Take 10-20 mg by mouth at bedtime.     amphetamine-dextroamphetamine (ADDERALL XR) 15 MG 24 hr capsule Take 15 mg by mouth daily as needed. (Patient not taking: Reported on 09/03/2021)     BAYER MICROLET LANCETS lancets USE AS DIRECTED TO CHECK BLOOD SUGAR TWICE DAILY (Patient not taking: Reported on 09/03/2021) 200 each 2   Blood Glucose Monitoring Suppl (CONTOUR NEXT MONITOR) w/Device KIT 1 each by Does not apply route in the morning and at bedtime. Use Contour Next meter to check blood sugar twice daily. (Patient not taking: Reported on 09/03/2021) 1 kit 0    Insulin Pen Needle (B-D UF III MINI PEN NEEDLES) 31G X 5 MM MISC USE FOR INJECTIONS TWICE DAILY (Patient not taking: Reported on 09/03/2021) 100 each 12   Tavaborole (KERYDIN) 5 % SOLN Apply 1 application topically at bedtime. Apply to affected area of left great toenail (Patient not taking: Reported on 06/04/2021) 10 mL 6   No current facility-administered medications on file prior to visit.     Family Hx: The patient's family history includes Alzheimer's disease in her father; Breast cancer in an other family member; Colon cancer in her father; Colon polyps in her father; Coronary artery disease in her father, mother, and another family member; Crohn's disease in her mother; Dementia in her father; Diabetes in an other family member; Hypertension in her mother; Osteoporosis in her mother; Stroke in her cousin. There is no history of Esophageal cancer, Rectal cancer, or Stomach cancer.  ROS:  Please see the history of present illness.    Review of Systems  Constitutional: Negative.   HENT: Negative.    Respiratory: Negative.    Cardiovascular: Negative.   Gastrointestinal: Negative.   Musculoskeletal: Negative.   Neurological: Negative.   Psychiatric/Behavioral: Negative.    All other systems reviewed and are negative.    Labs/Other Tests and Data Reviewed:    Recent Labs: 05/23/2021: ALT 41; BUN 20; Creatinine, Ser 0.72; Hemoglobin 15.3; Platelets 264; Potassium 4.1; Sodium 135 05/29/2021: TSH 0.50   Recent Lipid Panel Lab Results  Component Value Date/Time   CHOL 166 02/12/2021 11:17 AM   TRIG 194.0 (H) 02/12/2021 11:17 AM   HDL 50.70 02/12/2021 11:17 AM   CHOLHDL 3 02/12/2021 11:17 AM   LDLCALC 77 02/12/2021 11:17 AM   LDLDIRECT 72.0 05/29/2021 03:54 PM    Wt Readings from Last 3 Encounters:  09/03/21 201 lb (91.2 kg)  06/04/21 208 lb (94.3 kg)  05/29/21 206 lb (93.4 kg)     Exam:    BP 120/82 (BP Location: Left Arm, Patient Position: Sitting, Cuff Size: Normal)    Pulse 92   Ht _0  (1.676 m)   Wt 201 lb (91.2 kg)   LMP 08/23/2006 (Approximate) Comment: tubal ligation  SpO2 98%   BMI 32.44 kg/m  Constitutional:  oriented to person, place, and time. No distress.  Obese HENT:  Head: Grossly normal Eyes:  no discharge. No scleral icterus.  Neck: No JVD, no carotid bruits  Cardiovascular: Regular rate and rhythm, no murmurs appreciated Pulmonary/Chest: Clear to auscultation bilaterally, no wheezes or rails Abdominal: Soft.  no distension.  no tenderness.  Musculoskeletal: Normal range of motion Neurological:  normal muscle tone. Coordination normal. No atrophy Skin: Skin warm and dry Psychiatric: normal affect, pleasant   ASSESSMENT & PLAN:    Problem List Items Addressed This Visit       Cardiology Problems   Essential hypertension   Relevant Medications   metoprolol succinate (TOPROL-XL) 25 MG 24 hr tablet   Other Relevant Orders   EKG 12-Lead     Other   DM (diabetes mellitus) (HCC) (Chronic)   Other Visit Diagnoses     Coronary artery disease involving native coronary artery of native heart without angina pectoris    -  Primary   Relevant Medications   metoprolol succinate (TOPROL-XL) 25 MG 24 hr tablet   Other Relevant Orders   EKG 12-Lead   Frequent PVCs       Relevant Medications   metoprolol succinate (TOPROL-XL) 25 MG 24 hr tablet   Other Relevant Orders   EKG 12-Lead   PVC's (premature ventricular contractions)       Relevant Medications   metoprolol succinate (TOPROL-XL) 25 MG 24 hr tablet   Hyperlipidemia, mixed       Relevant Medications   metoprolol succinate (TOPROL-XL) 25 MG 24 hr tablet   History of tobacco use       Hyperlipidemia LDL goal <70       Relevant Medications   metoprolol succinate (TOPROL-XL) 25 MG 24 hr tablet     CAD with stable angina Currently with no symptoms of angina. No further workup at this time. Continue current medication regimen. In 2022 , stent placement ostial OM vessel On  asa , off Plavix Cholesterol close to goal Recommend she consider using the [place] which she has at home  Hyperlipidemia Numbers slightly above goal Stay on Lipitor, Zetia, consider the [place] which she has at home  Bipolar Followed by psychiatrist Stable  Obesity We have encouraged continued exercise, careful diet management in an effort to lose weight.  Diabetes with complications Z6X well controlled, 6.1 Carb restriction recommended    Total encounter time more than 30 minutes  Greater than 50% was spent in counseling and coordination of care with the patient    Signed, Ida Rogue, [id number], [place], [place] [address]

## 2021-09-03 ENCOUNTER — Encounter: Payer: Self-pay | Admitting: Cardiovascular Disease

## 2021-09-03 ENCOUNTER — Ambulatory Visit: Payer: BC Managed Care – PPO | Admitting: Cardiovascular Disease

## 2021-09-03 VITALS — BP 120/82 | HR 92 | Ht 66.0 in | Wt 201.0 lb

## 2021-09-03 DIAGNOSIS — Z87891 Personal history of nicotine dependence: Secondary | ICD-10-CM

## 2021-09-03 DIAGNOSIS — I10 Essential (primary) hypertension: Secondary | ICD-10-CM

## 2021-09-03 DIAGNOSIS — E782 Mixed hyperlipidemia: Secondary | ICD-10-CM

## 2021-09-03 DIAGNOSIS — E785 Hyperlipidemia, unspecified: Secondary | ICD-10-CM

## 2021-09-03 DIAGNOSIS — E1159 Type 2 diabetes mellitus with other circulatory complications: Secondary | ICD-10-CM

## 2021-09-03 DIAGNOSIS — I493 Ventricular premature depolarization: Secondary | ICD-10-CM | POA: Diagnosis not present

## 2021-09-03 DIAGNOSIS — I2510 Atherosclerotic heart disease of native coronary artery without angina pectoris: Secondary | ICD-10-CM | POA: Diagnosis not present

## 2021-09-03 MED ORDER — NITROGLYCERIN 0.4 MG SL SUBL: 0.4000 mg | SUBLINGUAL_TABLET | SUBLINGUAL | 3 refills | Status: DC | PRN

## 2021-09-03 NOTE — Patient Instructions (Signed)
Medication Instructions:  No changes  If you need a refill on your cardiac medications before your next appointment, please call your pharmacy.   Lab work: No new labs needed  Testing/Procedures: No new testing needed  Follow-Up: At [facility], you and your health needs are our priority.  As part of our continuing mission to provide you with exceptional heart care, we have created designated Provider Care Teams.  These Care Teams include your primary Cardiologist (physician) and Advanced Practice Providers (APPs -  Physician Assistants and Nurse Practitioners) who all work together to provide you with the care you need, when you need it.  You will need a follow up appointment in 12 months  Providers on your designated Care Team:   Christopher Berge, NP Ryan Dunn, PA-C Cadence Furth, PA-C  COVID-19 Vaccine Information can be found at: [email or web address] For questions related to vaccine distribution or appointments, please email [email or web address] or call [phone].   

## 2021-09-11 ENCOUNTER — Other Ambulatory Visit: Payer: Self-pay | Admitting: Medical

## 2021-09-11 ENCOUNTER — Ambulatory Visit (INDEPENDENT_AMBULATORY_CARE_PROVIDER_SITE_OTHER): Payer: BC Managed Care – PPO | Admitting: Dermatology

## 2021-09-11 DIAGNOSIS — L738 Other specified follicular disorders: Secondary | ICD-10-CM

## 2021-09-11 DIAGNOSIS — L821 Other seborrheic keratosis: Secondary | ICD-10-CM

## 2021-09-11 DIAGNOSIS — L918 Other hypertrophic disorders of the skin: Secondary | ICD-10-CM

## 2021-09-11 DIAGNOSIS — L719 Rosacea, unspecified: Secondary | ICD-10-CM | POA: Diagnosis not present

## 2021-09-11 DIAGNOSIS — D1800 Hemangioma unspecified site: Secondary | ICD-10-CM

## 2021-09-11 DIAGNOSIS — L720 Epidermal cyst: Secondary | ICD-10-CM

## 2021-09-11 DIAGNOSIS — L219 Seborrheic dermatitis, unspecified: Secondary | ICD-10-CM

## 2021-09-11 DIAGNOSIS — L814 Other melanin hyperpigmentation: Secondary | ICD-10-CM

## 2021-09-11 DIAGNOSIS — B351 Tinea unguium: Secondary | ICD-10-CM | POA: Diagnosis not present

## 2021-09-11 DIAGNOSIS — L820 Inflamed seborrheic keratosis: Secondary | ICD-10-CM

## 2021-09-11 DIAGNOSIS — D229 Melanocytic nevi, unspecified: Secondary | ICD-10-CM

## 2021-09-11 DIAGNOSIS — D225 Melanocytic nevi of trunk: Secondary | ICD-10-CM

## 2021-09-11 DIAGNOSIS — L578 Other skin changes due to chronic exposure to nonionizing radiation: Secondary | ICD-10-CM

## 2021-09-11 DIAGNOSIS — D2221 Melanocytic nevi of right ear and external auricular canal: Secondary | ICD-10-CM

## 2021-09-11 DIAGNOSIS — Z86018 Personal history of other benign neoplasm: Secondary | ICD-10-CM

## 2021-09-11 MED ORDER — KETOCONAZOLE 2 % EX CREA: TOPICAL_CREAM | CUTANEOUS | 11 refills | Status: DC

## 2021-09-11 NOTE — Progress Notes (Signed)
Follow-Up Visit   Subjective  Lindsay Fry is a 61 y.o. female who presents for the following: Annual Exam (1 year tbse , hx of seb hyperplasia, hx of dysplastic nevi, hx of tinea unguium at left great toenail. Patient reports spot at left side of face near ear, skin tags at left neck, spot at groin. ).  The patient presents for Total-Body Skin Exam (TBSE) for skin cancer screening and mole check.  The patient has spots, moles and lesions to be evaluated, some may be new or changing and the patient has concerns that these could be cancer.   The following portions of the chart were reviewed this encounter and updated as appropriate:      Review of Systems: No other skin or systemic complaints except as noted in HPI or Assessment and Plan.   Objective  Well appearing patient in no apparent distress; mood and affect are within normal limits.  A full examination was performed including scalp, head, eyes, ears, nose, lips, neck, chest, axillae, abdomen, back, buttocks, bilateral upper extremities, bilateral lower extremities, hands, feet, fingers, toes, fingernails, and toenails. All findings within normal limits unless otherwise noted below.  left great toenail Toenails painted  face Mid face erythema with telangiectasias +/- scattered inflammatory papules.   Left Preauricular Area and left neck Erythematous stuck-on, waxy papules  left inferior buttock Subcutaneous nodule.   right earlobe 5 mm brown macule   Right Lower Back 7 x 3 mm speckled brown papule   right lower sternum 4 mm medium brown macule with darker center stable when compared to photos   spinal upper back 10 x 7 mm brown speckled papule stable when compared to photos                   Assessment & Plan  Seborrheic dermatitis BL Ala Nasi, NL folds  Chronic condition with duration or expected duration over one year. Currently well-controlled.   Seborrheic Dermatitis  -  is a  chronic persistent rash characterized by pinkness and scaling most commonly of the mid face but also can occur on the scalp (dandruff), ears; mid chest and mid back. It tends to be exacerbated by stress and cooler weather.  People who have neurologic disease may experience new onset or exacerbation of existing seborrheic dermatitis.  The condition is not curable but treatable and can be controlled.   Continue ketoconazole 2% cream qd/bid to aas face    ketoconazole (NIZORAL) 2 % cream - BL Ala Nasi, NL folds Apply to affected areas one to two times daily as needed.  Tinea unguium left great toenail  Chronic fungal infection of toenail  Discussed oral terbinafine vrs topical treatment, pt prefers topical at this time  Patient D/c  Kerydin 5 % soln due to increased ingrown toenails since she started using it  Patient defers other treatment at this time.  Related Medications Tavaborole (KERYDIN) 5 % SOLN Apply 1 application topically at bedtime. Apply to affected area of left great toenail  Rosacea face  Chronic and persistent condition with duration or expected duration over one year. Condition is bothersome/symptomatic for patient. Currently flared.   Rosacea is a chronic progressive skin condition usually affecting the face of adults, causing redness and/or acne bumps. It is treatable but not curable. It sometimes affects the eyes (ocular rosacea) as well. It may respond to topical and/or systemic medication and can flare with stress, sun exposure, alcohol, exercise and some foods.  Daily application of  broad spectrum spf 30+ sunscreen to face is recommended to reduce flares.  Will prescribe [facility] metronidazole/ivermectin/azelaic acid twice daily as needed to affected areas on the face. The patient was advised this is not covered by insurance since it is made by a compounding pharmacy. They will receive an email to check out and the medication will be mailed to their home.    Instructions for [facility] Medications  One or more of your medications was sent to the [facility] mail order compounding pharmacy. You will receive an email from them and can purchase the medicine through that link. It will then be mailed to your home at the address you confirmed. If for any reason you do not receive an email from them, please check your spam folder. If you still do not find the email, please let us know. [facility] phone number is [phone].    Inflamed seborrheic keratosis Left Preauricular Area and left neck  Reassured benign age-related growth.  Recommend observation.  Discussed cryotherapy if spot(s) become irritated or inflamed.   Patient deferred treatment at this time since not bothersome  Epidermal inclusion cyst left inferior buttock  Benign-appearing. Exam most consistent with an epidermal inclusion cyst. Discussed that a cyst is a benign growth that can grow over time and sometimes get irritated or inflamed. Recommend observation if it is not bothersome. Discussed option of surgical excision to remove it if it is growing, symptomatic, or other changes noted. Please call for new or changing lesions so they can be evaluated.    Nevus (4) right earlobe; right lower sternum; Right Lower Back; spinal upper back  Benign-appearing.  Stable. Observation.  Call clinic for new or changing lesions.  Recommend daily use of broad spectrum spf 30+ sunscreen to sun-exposed areas.    Lentigines - Scattered tan macules - Due to sun exposure - Benign-appearing, observe - Recommend daily broad spectrum sunscreen SPF 30+ to sun-exposed areas, reapply every 2 hours as needed. - Call for any changes  Seborrheic Keratoses - Stuck-on, waxy, tan-brown papules and/or plaques  - Benign-appearing - Discussed benign etiology and prognosis. 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   Hemangiomas - Red papules - Discussed benign nature - Observe - Call for any changes  Acrochordons (Skin Tags) At neck - Fleshy, skin-colored pedunculated papules - Benign appearing.  - Observe. - If desired, they can be removed with an in office procedure that is not covered by insurance. - Please call the clinic if you notice any new or changing lesions.  Actinic Damage - Chronic condition, secondary to cumulative UV/sun exposure - diffuse scaly erythematous macules with underlying dyspigmentation - Recommend daily broad spectrum sunscreen SPF 30+ to sun-exposed areas, reapply every 2 hours as needed.  - Staying in the shade or wearing long sleeves, sun glasses (UVA+UVB protection) and wide brim hats (4-inch brim around the entire circumference of the hat) are also recommended for sun protection.  - Call for new or changing lesions.  History of Dysplastic Nevi At multiple sites see history  - No evidence of recurrence today - Recommend regular full body skin exams -  Recommend daily broad spectrum sunscreen SPF 30+ to sun-exposed areas, reapply every 2 hours as needed.  - Call if any new or changing lesions are noted between office visits  Skin cancer screening performed today. Return in about 1 year (around 09/12/2022) for TBSE. I, Ruthell Rummage, CMA, am acting as scribe for Brendolyn Patty, MD.  Documentation: I have reviewed the above documentation for accuracy and completeness, and I agree with the above.  Brendolyn Patty MD

## 2021-09-11 NOTE — Patient Instructions (Addendum)
Instructions for [facility] Medications  One or more of your medications was sent to the [facility] mail order compounding pharmacy. You will receive an email from them and can purchase the medicine through that link. It will then be mailed to your home at the address you confirmed. If for any reason you do not receive an email from them, please check your spam folder. If you still do not find the email, please let us know. [facility] phone number is [phone].   Seborrheic Keratosis  What causes seborrheic keratoses? Seborrheic keratoses are harmless, common skin growths that first appear during adult life.  As time goes by, more growths appear.  Some people may develop a large number of them.  Seborrheic keratoses appear on both covered and uncovered body parts.  They are not caused by sunlight.  The tendency to develop seborrheic keratoses can be inherited.  They vary in color from skin-colored to gray, brown, or even black.  They can be either smooth or have a rough, warty surface.   Seborrheic keratoses are superficial and look as if they were stuck on the skin.  Under the microscope this type of keratosis looks like layers upon layers of skin.  That is why at times the top layer may seem to fall off, but the rest of the growth remains and re-grows.    Treatment Seborrheic keratoses do not need to be treated, but can easily be removed in the office.  Seborrheic keratoses often cause symptoms when they rub on clothing or jewelry.  Lesions can be in the way of shaving.  If they become inflamed, they can cause itching, soreness, or burning.  Removal of a seborrheic keratosis can be accomplished by freezing, burning, or surgery. If any spot bleeds, scabs, or grows rapidly, please return to have it checked, as these can be an indication of a skin cancer.      Melanoma ABCDEs  Melanoma is the most dangerous type of skin cancer, and is the leading cause of death from skin disease.   You are more likely to develop melanoma if you: Have light-colored skin, light-colored eyes, or red or blond hair Spend a lot of time in the sun Tan regularly, either outdoors or in a tanning bed Have had blistering sunburns, especially during childhood Have a close family member who has had a melanoma Have atypical moles or large birthmarks  Early detection of melanoma is key since treatment is typically straightforward and cure rates are extremely high if we catch it early.   The first sign of melanoma is often a change in a mole or a new dark spot.  The ABCDE system is a way of remembering the signs of melanoma.  A for asymmetry:  The two halves do not match. B for border:  The edges of the growth are irregular. C for color:  A mixture of colors are present instead of an even brown color. D for diameter:  Melanomas are usually (but not always) greater than 14m - the size of a pencil eraser. E for evolution:  The spot keeps changing in size, shape, and color.  Please check your skin once per month between visits. You can use a small mirror in front and a large mirror behind you to keep an eye on the back side or your body.   If you see any new or changing lesions before your next follow-up, please call to schedule a visit.  Please continue daily skin protection including  broad spectrum sunscreen SPF 30+ to sun-exposed areas, reapplying every 2 hours as needed when you're outdoors.   Staying in the shade or wearing long sleeves, sun glasses (UVA+UVB protection) and wide brim hats (4-inch brim around the entire circumference of the hat) are also recommended for sun protection.    Due to recent changes in healthcare laws, you may see results of your pathology and/or laboratory studies on MyChart before the doctors have had a chance to review them. We understand that in some cases there may be results that are confusing or concerning to you. Please understand that not all results are  received at the same time and often the doctors may need to interpret multiple results in order to provide you with the best plan of care or course of treatment. Therefore, we ask that you please give [place] 2 business days to thoroughly review all your results before contacting the office for clarification. Should we see a critical lab result, you will be contacted sooner.   If You Need Anything After Your Visit  If you have any questions or concerns for your doctor, please call our main line at [phone] and press option 4 to reach your doctor's medical assistant. If no one answers, please leave a voicemail as directed and we will return your call as soon as possible. Messages left after 4 pm will be answered the following business day.   You may also send [place] a message via Early. We typically respond to MyChart messages within 1-2 business days.  For prescription refills, please ask your pharmacy to contact our office. Our fax number is [phone].  If you have an urgent issue when the clinic is closed that cannot wait until the next business day, you can page your doctor at the number below.    Please note that while we do our best to be available for urgent issues outside of office hours, we are not available 24/7.   If you have an urgent issue and are unable to reach [place], you may choose to seek medical care at your doctor's office, retail clinic, urgent care center, or emergency room.  If you have a medical emergency, please immediately call 911 or go to the emergency department.  Pager Numbers  - Dr. Nehemiah Massed: [phone]  - Dr. Laurence Ferrari: [phone]  - Dr. Nicole Kindred: [phone]  In the event of inclement weather, please call our main line at [phone] for an update on the status of any delays or closures.  Dermatology Medication Tips: Please keep the boxes that topical medications come in in order to help keep track of the instructions about where and how to use these.  Pharmacies typically print the medication instructions only on the boxes and not directly on the medication tubes.   If your medication is too expensive, please contact our office at [phone] option 4 or send [place] a message through Queets.   We are unable to tell what your co-pay for medications will be in advance as this is different depending on your insurance coverage. However, we may be able to find a substitute medication at lower cost or fill out paperwork to get insurance to cover a needed medication.   If a prior authorization is required to get your medication covered by your insurance company, please allow [place] 1-2 business days to complete this process.  Drug prices often vary depending on where the prescription is filled and some pharmacies may offer cheaper prices.  The website www.goodrx.com contains coupons for  medications through different pharmacies. The prices here do not account for what the cost may be with help from insurance (it may be cheaper with your insurance), but the website can give you the price if you did not use any insurance.  - You can print the associated coupon and take it with your prescription to the pharmacy.  - You may also stop by our office during regular business hours and pick up a GoodRx coupon card.  - If you need your prescription sent electronically to a different pharmacy, notify our office through [facility] or by phone at [phone] option 4.     Si Usted Necesita Algo Despus de Su Visita  Tambin puede enviarnos un mensaje a travs de Pharmacist, community. Por lo general respondemos a los mensajes de MyChart en el transcurso de 1 a 2 das hbiles.  Para renovar recetas, por favor pida a su farmacia que se ponga en contacto con nuestra oficina. Harland Dingwall de fax es [place] [phone].  Si tiene un asunto urgente cuando la clnica est cerrada y que no puede esperar hasta el siguiente da hbil, puede llamar/localizar a su doctor(a) al nmero  que aparece a continuacin.   Por favor, tenga en cuenta que aunque hacemos todo lo posible para estar disponibles para asuntos urgentes fuera del horario de [place], no estamos disponibles las 24 horas del da, los 7 das de la [place].   Si tiene un problema urgente y no puede comunicarse con nosotros, puede optar por buscar atencin mdica  en el consultorio de su doctor(a), en una clnica privada, en un centro de atencin urgente o en una sala de emergencias.  Si tiene Engineering geologist, por favor llame inmediatamente al 911 o vaya a la sala de emergencias.  Nmeros de bper  - Dr. Nehemiah Massed: [phone]  - Dra. Moye: [phone]  - Dra. Nicole Kindred: [phone]  En caso de inclemencias del [place], por favor llame a Johnsie Kindred principal al [phone] para una actualizacin sobre el [place] de cualquier retraso o cierre.  Consejos para la medicacin en dermatologa: Por favor, guarde las cajas en las que vienen los medicamentos de uso tpico para ayudarle a seguir las instrucciones sobre dnde y cmo usarlos. Las farmacias generalmente imprimen las instrucciones del medicamento slo en las cajas y no directamente en los tubos del [place].   Si su medicamento es muy caro, por favor, pngase en contacto con Zigmund Daniel llamando al [phone] y presione la opcin 4 o envenos un mensaje a travs de Pharmacist, community.   No podemos decirle cul ser su copago por los medicamentos por adelantado ya que esto es diferente dependiendo de la cobertura de su seguro. Sin embargo, es posible que podamos encontrar un medicamento sustituto a Electrical engineer un formulario para que el seguro cubra el medicamento que se considera necesario.   Si se requiere una autorizacin previa para que su compaa de seguros [place] su medicamento, por favor permtanos de 1 a 2 das hbiles para completar este proceso.  Los precios de los medicamentos varan con frecuencia dependiendo del Environmental consultant de dnde se  surte la receta y alguna farmacias pueden ofrecer precios ms baratos.  El sitio web www.goodrx.com tiene cupones para medicamentos de Airline pilot. Los precios aqu no tienen en cuenta lo que podra costar con la ayuda del seguro (puede ser ms barato con su seguro), pero el sitio web puede darle el precio si no utiliz Research scientist (physical sciences).  - Puede imprimir el cupn correspondiente y  llevarlo con su receta a la farmacia.  - Tambin puede pasar por nuestra oficina durante el horario de atencin regular y Charity fundraiser una tarjeta de cupones de GoodRx.  - Si necesita que su receta se enve electrnicamente a una farmacia diferente, informe a nuestra oficina a travs de MyChart de [place] o por telfono llamando al [phone] y presione la opcin 4.

## 2021-09-18 ENCOUNTER — Other Ambulatory Visit: Payer: Self-pay | Admitting: Endocrinology

## 2021-09-18 DIAGNOSIS — E669 Obesity, unspecified: Secondary | ICD-10-CM

## 2021-10-02 ENCOUNTER — Ambulatory Visit (INDEPENDENT_AMBULATORY_CARE_PROVIDER_SITE_OTHER): Payer: BC Managed Care – PPO | Admitting: Family

## 2021-10-02 ENCOUNTER — Encounter: Payer: Self-pay | Admitting: Family

## 2021-10-02 VITALS — BP 110/66 | HR 91 | Temp 98.7°F | Resp 16 | Ht 66.0 in | Wt 204.0 lb

## 2021-10-02 DIAGNOSIS — L02214 Cutaneous abscess of groin: Secondary | ICD-10-CM | POA: Diagnosis not present

## 2021-10-02 DIAGNOSIS — B009 Herpesviral infection, unspecified: Secondary | ICD-10-CM | POA: Diagnosis not present

## 2021-10-02 MED ORDER — DOXYCYCLINE HYCLATE 100 MG PO TABS: 100.0000 mg | ORAL_TABLET | Freq: Two times a day (BID) | ORAL | 0 refills | Status: AC

## 2021-10-02 NOTE — Patient Instructions (Signed)
Monitor daily for worsening signs infection  Warm compresses to thigh abscess    Regards,   Valori Hollenkamp FNP-C

## 2021-10-02 NOTE — Progress Notes (Signed)
Established Patient Office Visit  Subjective:  Patient ID: Lindsay Fry, female    DOB: 07/18/1960  Age: 61 y.o. MRN: [id number]  CC:  Chief Complaint  Patient presents with   Cyst    Left thigh  near goins X 4 days    HPI Lindsay Fry is here today with concerns.   Over the last four days started with suspected herpes lesion on her left thigh near her groin area. She did double up her valtrex for the first occurrence and it went away, and then three nights ago she had it reoccur. Yesterday am when she woke up she thinks she might have been scratching her leg while sleeping, and noticed a new bump on her left thigh. Has Tegaderm over it right now. The lesion is painful and uncomfortable when something rubs against it.   She is back to taking twice daily valtrex   She does have a lot of increased stress as of late and this is a huge stress due to this being a huge family financial strain.   Past Medical History:  Diagnosis Date   ADD (attention deficit disorder) 11/19/2012   Allergic rhinitis, cause unspecified    Anxiety state, unspecified    Arthritis    Asymptomatic postmenopausal status (age-related) (natural)    Atypical hyperplasia of left breast 2000   Atypical mole 03/16/2018   R paraspinal mid upper back, excision   Atypical mole 02/17/2018   R spinal upper back, moderate   Atypical mole 08/11/2017   L sternum, excision   Atypical mole 05/27/2017   L post neck, moderate   Benign neoplasm of other and unspecified site of the digestive system    Bipolar disorder ([place])    Breast cancer ([place])    Left Breast 80m invasive CA left uiq, dcis left uoq and a lot ADH, ALH in both breasts.   CAD (coronary artery disease)    a. 08/2014 NSTEMI/Cath: LM nl, LAD 50p, D1/D2 min irregs, LCX nl, OM1 40, LPDA nl, RI 99ost (2.088mvessel->med Rx), RCA nl, AM 60, nl EF; b. 11/2016 MV: EF 74%, no ischemia (performed 2/2 dyspnea & new inf TWI).   [place]arcin[place] of [place] breast ([place] 06/02/2014   Cataract    Chronic diastolic CHF (congestive heart failure) ([place]   a. 08/2014 Echo: EF 60-65%, Gr 1 DD, nl LA.   Clotting disorder ([facility]   on blood thiner, Plavix   Cyst    on Achilles tendon   Depression    Diabetes mellitus ([facility]   frank   Dysuria    Edema    Family history of malignant neoplasm of gastrointestinal tract    Fibromyalgia    GERD (gastroesophageal reflux disease)    Glaucoma    pre glaucoma, 05/14/2018 pt. denies glaucoma   Headache    migraines   Hiatal hernia    History of alcoholism ([facility]   History of migraines    History of ovarian cyst    Hx of adenomatous colonic polyps    Insomnia, unspecified    My[place] infarction [place] [place]   OCD (obsessive compulsive disorder)    Osteopenia 09/25/2016   Femoral neck T -1.1  9/18   Other screening mammogram    Pure hypercholesterolemia    Rosacea    Subacute confusional state 11/19/2012   Tubular adenoma of colon 01/10/11   Ulcerative colitis, unspecified    Unspecified asthma(493.90)    Unspecified essential hypertension  Unspecified hypothyroidism    Unspecified vitamin D deficiency    Vertigo     Past Surgical History:  Procedure Laterality Date   APPENDECTOMY     AXILLARY SENTINEL NODE BIOPSY Left 05/23/2014   Procedure: AXILLARY SENTINEL NODE BIOPSY;  Surgeon: Christene Lye, MD;  Location: [facility];  Service: General;  Laterality: Left;   BREAST BIOPSY  08/1998   on tamoxifen, atypical hyperplasia   BREAST BIOPSY Left 04/2014   BREAST SURGERY Left 08/1998   lumpectomy/ Dr Sharlet Salina   BREAST SURGERY Bilateral 05/23/2014   Mastectomy   BUNIONECTOMY Right    CARDIAC CATHETERIZATION N/A 09/05/2014   Procedure: Left Heart Cath and Coronary Angiography;  Surgeon: Wellington Hampshire, MD;  Location: [facility] [facility];  Service: Cardiovascular;  Laterality: N/A;   CARDIAC CATHETERIZATION  09/05/2014   CATARACT EXTRACTION W/ INTRAOCULAR LENS IMPLANT Bilateral    CESAREAN  SECTION  1996/1997   placenta previa, gest DM, pre-eclampsia   CHOLECYSTECTOMY  1997   adhesions also   COLONOSCOPY  01/2001   Ulcerative colitis   COLONOSCOPY  04/2004   UC, polyp   COLONOSCOPY  12/2006   UC, no polyps   CORONARY STENT INTERVENTION N/A 04/06/2020   Procedure: CORONARY STENT INTERVENTION;  Surgeon: Nelva Bush, MD;  Location: [facility] [facility];  Service: Cardiovascular;  Laterality: N/A;   DEXA  04/1999 and 2010   normal   ESOPHAGOGASTRODUODENOSCOPY  09/2001   polyp   exercise stress test  11/2003   negative   FEMUR FRACTURE SURGERY Left    LEFT HEART CATH AND CORONARY ANGIOGRAPHY N/A 04/06/2020   Procedure: LEFT HEART CATH AND CORONARY ANGIOGRAPHY;  Surgeon: Minna Merritts, MD;  Location: [facility];  Service: Cardiovascular;  Laterality: N/A;   MASTECTOMY Bilateral    MECKEL DIVERTICULUM EXCISION  1999   NASAL SINUS SURGERY  05/1997   nuclear stress test  06/2007   negative   precancerous mole removed     RIGHT AND LEFT HEART CATH     with 1 stent placed in March 2022   SIMPLE MASTECTOMY WITH AXILLARY SENTINEL NODE BIOPSY Bilateral 05/23/2014   Procedure: Bilateral simple mastectomy, left sentinel node biopsy ;  Surgeon: Christene Lye, MD;  Location: [facility];  Service: General;  Laterality: Bilateral;   Sleep study  11/2007   no apnea, but did snore (done by [facility])   [place] EXTRACTION  58's    Family History  Problem Relation Age of Onset   Coronary artery disease Father    Colon cancer Father    Alzheimer's disease Father    Dementia Father    Colon polyps Father    Coronary artery disease Mother    Hypertension Mother    Osteoporosis Mother    Crohn's disease Mother        crohns colitis   Breast cancer Other        great aunts   Coronary artery disease Other        Uncle (also AAA)   Diabetes Other        remote family history   Stroke Cousin    Esophageal  cancer Neg Hx    Rectal cancer Neg Hx    Stomach cancer Neg Hx     Social History   Socioeconomic History   Marital status: Married    Spouse name: alan   Number of children:  2   Years of education: college   Highest education level: Not on file  Occupational History   Occupation: unemployed  Tobacco Use   Smoking status: Former    Packs/day: 0.25    Years: 5.00    Total pack years: 1.25    Types: Cigarettes    Quit date: 01/14/1993    Years since quitting: 28.7   Smokeless tobacco: Never  Vaping Use   Vaping Use: Never used  Substance and Sexual Activity   Alcohol use: No    Alcohol/week: 0.0 standard drinks of alcohol    Comment: Recovered ETOH   Drug use: No   Sexual activity: Not Currently    Partners: Male  Other Topics Concern   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     albuterol (VENTOLIN HFA) 108 (90 Base) MCG/ACT inhaler INHALE 1 PUFF INTO THE LUNGS EVERY 4 HOURS AS NEEDED FOR WHEEZING     amantadine (SYMMETREL) 100 MG capsule Take 100 mg by mouth 2 (two) times daily.     Ascorbic Acid (VITAMIN C PO) Take 1,000 mg by mouth daily.     aspirin 81 MG chewable tablet Chew 1 tablet (81 mg total) by mouth daily. 90 tablet 0   atorvastatin (LIPITOR) 80 MG tablet TAKE 1 TABLET BY MOUTH ONCE EVERY EVENING 90 tablet 0   B Complex Vitamins (VITAMIN B COMPLEX PO) Take 1 tablet by mouth every other day.     BAYER  MICROLET LANCETS lancets USE AS DIRECTED TO CHECK BLOOD SUGAR TWICE DAILY 200 each 2   Blood Glucose Monitoring Suppl (CONTOUR NEXT MONITOR) w/Device KIT 1 each by Does not apply route in the morning and at bedtime. Use Contour Next meter to check blood sugar twice daily. 1 kit 0   Calcium Carb-Cholecalciferol (CALTRATE 600+D3 PO) Take 1 tablet by mouth daily. 300 mg Vitamin D3     Co-Enzyme Q-10 100 MG CAPS Take 100 mg by mouth daily.     cyclobenzaprine (FLEXERIL) 5 MG tablet Take 1 tablet (5 mg total) by mouth every 8 (eight) hours as needed for muscle spasms. 20 tablet 0   dextroamphetamine (DEXEDRINE SPANSULE) 15 MG 24 hr capsule Take 15 mg by mouth as needed.     diazepam (VALIUM) 10 MG tablet Take 5-10 mg by mouth daily as needed for anxiety.     diphenhydrAMINE (BENADRYL) 25 MG tablet Take 50 mg by mouth at bedtime.     docusate sodium (COLACE) 100 MG capsule Take 100-300 mg by mouth daily as needed for mild constipation or moderate constipation.     empagliflozin (JARDIANCE) 25 MG TABS tablet TAKE 1 TABLET(25 MG) BY MOUTH DAILY 90 tablet 3   ezetimibe (ZETIA) 10 MG tablet TAKE 1 TABLET(10 MG) BY MOUTH DAILY 90 tablet 3   famotidine (  PEPCID) 40 MG tablet Take 40 mg by mouth 2 (two) times daily. 30 min. Before meal and at bedtime     fluticasone (FLONASE) 50 MCG/ACT nasal spray Place 1 spray into both nostrils daily. 16 g 6   gabapentin (NEURONTIN) 300 MG capsule TAKE 1 CAPSULE BY MOUTH 3 TIMES DAILY 270 capsule 3   glucose blood (CONTOUR NEXT TEST) test strip USE AS DIRECTED 100 strip 2   insulin aspart (NOVOLOG FLEXPEN) 100 UNIT/ML FlexPen Use 10 to 15 units when blood sugars are more than 200 15 mL 1   Insulin Pen Needle (B-D UF III MINI PEN NEEDLES) 31G X 5 MM MISC USE FOR INJECTIONS TWICE DAILY 100 each 12   ketoconazole (NIZORAL) 2 % cream Apply to affected areas one to two times daily as needed. 30 g 11   KRILL OIL PO Take by mouth.     lamoTRIgine (LAMICTAL) 200 MG tablet Take 400  mg by mouth daily.     latanoprost (XALATAN) 0.005 % ophthalmic solution Place 1 drop into both eyes 2 (two) times a week.      LEVEMIR FLEXPEN 100 UNIT/ML FlexPen USE 60 UNITS SUBCUTANEOUSLY ONCE EVERY EVENING AS DIRECTED 15 mL 5   LEVOXYL 125 MCG tablet TAKE 2 TABLETS BY MOUTH ONCE DAILY BEFORE BREAKFAST 100 tablet 1   loperamide (IMODIUM A-D) 2 MG tablet Take 2 mg by mouth daily as needed for diarrhea or loose stools.     Melatonin 10 MG CAPS Take 10 mg by mouth at bedtime.     mesalamine (LIALDA) 1.2 g EC tablet TAKE 2 TABLETS BY MOUTH ONCE DAILY 180 tablet 1   metoprolol succinate (TOPROL-XL) 25 MG 24 hr tablet Take 12.5 mg by mouth daily.     Multiple Vitamin (MULTIVITAMIN) tablet Take 1 tablet by mouth daily. Woman 50 plus     nitroGLYCERIN (NITROSTAT) 0.4 MG SL tablet Place 1 tablet (0.4 mg total) under the tongue every 5 (five) minutes as needed for chest pain. 25 tablet 3   olmesartan (BENICAR) 5 MG tablet TAKE 1 TABLET BY MOUTH ONCE DAILY 90 tablet 3   ondansetron (ZOFRAN-ODT) 4 MG disintegrating tablet DISSOLVE 1 TABLET(4 MG) ON THE TONGUE EVERY 6 HOURS AS NEEDED FOR NAUSEA OR VOMITING 90 tablet 1   pantoprazole (PROTONIX) 40 MG tablet TAKE 1 TABLET(40 MG) BY MOUTH TWICE DAILY 180 tablet 1   polyethylene glycol (MIRALAX / GLYCOLAX) 17 g packet Take 17 g by mouth 3 (three) times daily as needed for moderate constipation, mild constipation or severe constipation. In water or beverage     Probiotic Product (CULTURELLE PROBIOTICS PO) Take 1 capsule by mouth daily. For woman     QUEtiapine (SEROQUEL XR) 400 MG 24 hr tablet Take 800 mg by mouth at bedtime.     Semaglutide, 2 MG/DOSE, (OZEMPIC, 2 MG/DOSE,) 8 MG/3ML SOPN INJECT 2 GRAMS SUBCUTANEOUSLY ONCE A WEEK 3 mL 1   simethicone (MYLICON) 80 MG chewable tablet Chew 80 mg by mouth 4 (four) times daily.     Tavaborole (KERYDIN) 5 % SOLN Apply 1 application topically at bedtime. Apply to affected area of left great toenail 10 mL 6    valACYclovir (VALTREX) 1000 MG tablet TAKE 1 TABLET BY MOUTH ONCE DAILY 90 tablet 3   VRAYLAR capsule Take 3 mg by mouth at bedtime.     zaleplon (SONATA) 10 MG capsule Take 10-20 mg by mouth at bedtime.     amphetamine-dextroamphetamine (ADDERALL XR) 15 MG 24 hr  capsule Take 15 mg by mouth daily as needed. (Patient not taking: Reported on 10/02/2021)     No facility-administered medications prior to visit.    Allergies  Allergen Reactions   Ephedrine Other (See Comments)    Pt becomes hyper   Oxycodone Other (See Comments)    Panic Attack   Aspirin Other (See Comments)    REACTION: aggrivates colitis Tolerates 81 mg but unable to take doses >81 mg   Erythromycin Other (See Comments)    REACTION: GI upset   Nsaids     REACTION: aggrivate colitis   Other Other (See Comments)    Unable to take due to history of ulcerative colitis   Rosuvastatin Other (See Comments)    REACTION: increased lfts        Objective:    Physical Exam Constitutional:      Appearance: Normal appearance. She is obese.  Pulmonary:     Effort: Pulmonary effort is normal.  Skin:    Comments: Left inner thigh raised nodule with slight tenderness and clear to yellow pus in cyst, no draining.   Healed over ulceration with dryness on left inner thigh inferior to nodule listed above   Neurological:     General: No focal deficit present.     Mental Status: She is alert and oriented to person, place, and time. Mental status is at baseline.  Psychiatric:        Mood and Affect: Mood normal.        Behavior: Behavior normal.        Thought Content: Thought content normal.        Judgment: Judgment normal.     BP 110/66   Pulse 91   Temp 98.7 F (37.1 C)   Resp 16   Ht _0  (1.676 m)   Wt 204 lb (92.5 kg)   LMP 08/23/2006 (Approximate) Comment: tubal ligation  SpO2 95%   BMI 32.93 kg/m  Wt Readings from Last 3 Encounters:  10/02/21 204 lb (92.5 kg)  09/03/21 201 lb (91.2 kg)  06/04/21 208 lb  (94.3 kg)     Health Maintenance Due  Topic Date Due   Zoster Vaccines- Shingrix (1 of 2) Never done   COVID-19 Vaccine (3 - Pfizer risk series) 08/06/2019   PAP SMEAR-Modifier  01/31/2020   TETANUS/TDAP  12/04/2020   OPHTHALMOLOGY EXAM  01/23/2021   INFLUENZA VACCINE  08/14/2021   Diabetic kidney evaluation - Urine ACR  10/12/2021    There are no preventive care reminders to display for this patient.  Lab Results  Component Value Date   TSH 0.50 05/29/2021   Lab Results  Component Value Date   WBC 7.1 05/23/2021   HGB 15.3 (H) 05/23/2021   HCT 47.1 (H) 05/23/2021   MCV 93.5 05/23/2021   PLT 264 05/23/2021   Lab Results  Component Value Date   NA 135 05/23/2021   K 4.1 05/23/2021   CO2 24 05/23/2021   GLUCOSE 147 (H) 05/23/2021   BUN 20 05/23/2021   CREATININE 0.72 05/23/2021   BILITOT 0.8 05/23/2021   ALKPHOS 71 05/23/2021   AST 31 05/23/2021   ALT 41 05/23/2021   PROT 6.7 05/23/2021   ALBUMIN 4.4 05/23/2021   CALCIUM 8.7 (L) 05/23/2021   ANIONGAP 7 05/23/2021   GFR 67.59 02/12/2021   Lab Results  Component Value Date   HGBA1C 6.1 05/29/2021      Assessment & Plan:   Problem List Items Addressed This Visit  Other   HSV-2 infection    Continue valtrex 1 mg po bid x 7 days       Abscess of left groin - Primary    RX doxycycline 100 mg po bid x 10 days Please monitor site for worsening signs/symptoms of infection to include: increasing redness, increasing tenderness, increase in size, and or pustulant drainage from site. If this is to occur please let me know immediately.        Relevant Medications   doxycycline (VIBRA-TABS) 100 MG tablet    Meds ordered this encounter  Medications   doxycycline (VIBRA-TABS) 100 MG tablet    Sig: Take 1 tablet (100 mg total) by mouth 2 (two) times daily for 10 days.    Dispense:  20 tablet    Refill:  0    Order Specific Question:   Supervising Provider    Answer:   Diona Browner, AMY E [[id number]]     Follow-up: Return for f/u with primary care provider if no improvement.    Eugenia Pancoast, FNP

## 2021-10-02 NOTE — Assessment & Plan Note (Signed)
RX doxycycline 100 mg po bid x 10 days Please monitor site for worsening signs/symptoms of infection to include: increasing redness, increasing tenderness, increase in size, and or pustulant drainage from site. If this is to occur please let me know immediately.

## 2021-10-02 NOTE — Assessment & Plan Note (Signed)
Continue valtrex 1 mg po bid x 7 days

## 2021-10-02 NOTE — Addendum Note (Signed)
Addended by: Eugenia Pancoast on: 10/02/2021 01:39 PM   Modules accepted: Orders

## 2021-10-03 ENCOUNTER — Ambulatory Visit: Payer: BC Managed Care – PPO | Admitting: Family Medicine

## 2021-10-05 LAB — WOUND CULTURE
MICRO NUMBER:: 13937799
SPECIMEN QUALITY:: ADEQUATE

## 2021-10-13 ENCOUNTER — Encounter: Payer: Self-pay | Admitting: Family

## 2021-10-16 ENCOUNTER — Encounter: Payer: Self-pay | Admitting: Family

## 2021-10-16 ENCOUNTER — Encounter: Payer: Self-pay | Admitting: Family Medicine

## 2021-10-17 ENCOUNTER — Other Ambulatory Visit: Payer: Self-pay | Admitting: Family

## 2021-10-17 MED ORDER — VALACYCLOVIR HCL 1 G PO TABS: ORAL_TABLET | ORAL | 3 refills | Status: DC

## 2021-10-29 ENCOUNTER — Encounter: Payer: Self-pay | Admitting: Family Medicine

## 2021-10-29 ENCOUNTER — Ambulatory Visit: Payer: BC Managed Care – PPO | Admitting: Family Medicine

## 2021-10-29 VITALS — BP 148/86 | HR 102 | Temp 97.5°F | Ht 66.0 in | Wt 206.5 lb

## 2021-10-29 DIAGNOSIS — L02214 Cutaneous abscess of groin: Secondary | ICD-10-CM | POA: Diagnosis not present

## 2021-10-29 DIAGNOSIS — F430 Acute stress reaction: Secondary | ICD-10-CM | POA: Diagnosis not present

## 2021-10-29 DIAGNOSIS — F3130 Bipolar disorder, current episode depressed, mild or moderate severity, unspecified: Secondary | ICD-10-CM | POA: Diagnosis not present

## 2021-10-29 DIAGNOSIS — B009 Herpesviral infection, unspecified: Secondary | ICD-10-CM

## 2021-10-29 NOTE — Progress Notes (Signed)
Subjective:    Patient ID: Lindsay Fry, female    DOB: March 20, 1960, 61 y.o.   MRN: [id number]  HPI Pt presents for skin lesion/? Cyst Stress reaction /husb with parkinson's dz  Wt Readings from Last 3 Encounters:  10/29/21 206 lb 8 oz (93.7 kg)  10/02/21 204 lb (92.5 kg)  09/03/21 201 lb (91.2 kg)   33.33 kg/m   H/o genital HSV Was px valtrex 1 g daily for 5 d Thinks stress caused this    Had visit with NP Dugal for cyst L thigh (draining yellow pus)  Was treated with doxycycline for 10 d  Wound culture was negative   Now has another lesion next to the previous one (inner L thigh, most posterior)  It did not get as bad /did not appear infected  Is mildly uncomfortable   Exercise- pool walking regularly-it helps   Stress= a lot worse  Not sleeping  Sees psychiatrist for bipolar dz  Financial problems  Gets impatient and irritable   She has valium from her psychiatrist - occ has to take 1/4 pill  Not regularly    Counselor- Miguel Dibble (at Dr Waylan [facility] office)  Uses help guide.org- some good resources      Review of Systems     Objective:   Physical Exam Constitutional:      General: She is not in acute distress.    Appearance: Normal appearance. She is obese. She is not ill-appearing.  HENT:     Head: Normocephalic and atraumatic.  Eyes:     General:        Right eye: No discharge.        Left eye: No discharge.     Conjunctiva/sclera: Conjunctivae normal.     Pupils: Pupils are equal, round, and reactive to light.  Cardiovascular:     Rate and Rhythm: Normal rate and regular rhythm.     Heart sounds: Normal heart sounds.  Pulmonary:     Effort: Pulmonary effort is normal. No respiratory distress.     Breath sounds: Normal breath sounds.  Musculoskeletal:     Cervical back: Neck supple.  Lymphadenopathy:     Cervical: No cervical adenopathy.  Skin:    General: Skin is warm and dry.     Coloration: Skin is not pale.     Findings: No  bruising.     Comments: 1 cm round area of erythema and induration (not fluctuant) Some peeling skin (looks to be healing) No drainage Not tender   Healed hyperpigmented scab superior to this   Neurological:     Mental Status: She is alert.     Sensory: No sensory deficit.  Psychiatric:        Mood and Affect: Mood normal.     Comments: Candidly discusses symptoms and stressors             Assessment & Plan:   Problem List Items Addressed This Visit       Other   Abscess of left groin - Primary    This area appears healed  There is a small area of induration /scant erythema inferior on L inner thigh Not an abscess currently  inst to continue clean (has hibaclens)  Warm compress prn Does not resemble herpes lesion  Consider doxycycline if this progresses  Update if not starting to improve in a week or if worsening        Bipolar I disorder, most recent episode depressed ([place])  Dealing with stressor of husband with Parkinsons  Also financial stressors  Psychiatrist is aware Seeing counselor also  Reviewed stressors/ coping techniques/symptoms/ support sources/ tx options and side effects in detail today  Enc good self care       HSV-2 infection    Current lesion on L inner thigh is not consistent with HSV (not vesicular)       Stress reaction    Husband with parkinson's and financial concerns Reviewed stressors/ coping techniques/symptoms/ support sources/ tx options and side effects in detail today Irritable with anxiety  Continues psych care  Counseling is helpful  Enc good self care

## 2021-10-29 NOTE — Assessment & Plan Note (Signed)
Dealing with stressor of husband with Parkinsons  Also financial stressors  Psychiatrist is aware Seeing counselor also  Reviewed stressors/ coping techniques/symptoms/ support sources/ tx options and side effects in detail today  Enc good self care

## 2021-10-29 NOTE — Assessment & Plan Note (Signed)
Husband with parkinson's and financial concerns Reviewed stressors/ coping techniques/symptoms/ support sources/ tx options and side effects in detail today Irritable with anxiety  Continues psych care  Counseling is helpful  Enc good self care

## 2021-10-29 NOTE — Patient Instructions (Addendum)
Keep the area very clean with soap and water or hibaclens   Use a gentle warm compress for 10 minutes at a time when you can   If it gets bigger/ sore/red let us know and we may need to repeat doxcycline   Continue seeing your counselor   Take care of yourself  Keep up the exercise also

## 2021-10-29 NOTE — Assessment & Plan Note (Signed)
This area appears healed  There is a small area of induration /scant erythema inferior on L inner thigh Not an abscess currently  inst to continue clean (has hibaclens)  Warm compress prn Does not resemble herpes lesion  Consider doxycycline if this progresses  Update if not starting to improve in a week or if worsening

## 2021-10-29 NOTE — Assessment & Plan Note (Signed)
Current lesion on L inner thigh is not consistent with HSV (not vesicular)

## 2021-11-08 ENCOUNTER — Other Ambulatory Visit: Payer: Self-pay | Admitting: Endocrinology

## 2021-11-08 ENCOUNTER — Encounter: Payer: Self-pay | Admitting: Endocrinology

## 2021-11-08 MED ORDER — TIRZEPATIDE 5 MG/0.5ML ~~LOC~~ SOAJ: 5.0000 mg | SUBCUTANEOUS | 0 refills | Status: DC

## 2021-11-21 ENCOUNTER — Encounter: Payer: Self-pay | Admitting: Endocrinology

## 2021-11-21 DIAGNOSIS — E1169 Type 2 diabetes mellitus with other specified complication: Secondary | ICD-10-CM

## 2021-11-21 DIAGNOSIS — E890 Postprocedural hypothyroidism: Secondary | ICD-10-CM

## 2021-11-21 MED ORDER — LEVOXYL 125 MCG PO TABS: ORAL_TABLET | ORAL | 1 refills | Status: DC

## 2021-11-23 ENCOUNTER — Other Ambulatory Visit: Payer: Self-pay | Admitting: Cardiovascular Disease

## 2021-11-26 ENCOUNTER — Other Ambulatory Visit: Payer: Self-pay | Admitting: Cardiovascular Disease

## 2021-11-27 MED ORDER — BD PEN NEEDLE MINI U/F 31G X 5 MM MISC: 3 refills | Status: DC

## 2021-11-27 NOTE — Addendum Note (Signed)
Addended by: Lauralyn Primes on: 11/27/2021 05:05 PM   Modules accepted: Orders

## 2021-12-03 ENCOUNTER — Telehealth: Payer: Self-pay | Admitting: Family Medicine

## 2021-12-03 DIAGNOSIS — E1169 Type 2 diabetes mellitus with other specified complication: Secondary | ICD-10-CM

## 2021-12-03 DIAGNOSIS — I10 Essential (primary) hypertension: Secondary | ICD-10-CM

## 2021-12-03 NOTE — Telephone Encounter (Signed)
Not sure what she needs besides cmet and lipid   A1c  and tsh are done by endocrinology  Thanks   Let me know if there is something else she needs

## 2021-12-03 NOTE — Telephone Encounter (Signed)
-----   Message from Velna Hatchet, RT sent at 12/03/2021 10:18 AM EST ----- Regarding: Tue 11/21 lab Lab orders needed for appt tomorrow, 11/21, please.  Thanks, Anda Kraft

## 2021-12-04 ENCOUNTER — Other Ambulatory Visit (INDEPENDENT_AMBULATORY_CARE_PROVIDER_SITE_OTHER): Payer: BC Managed Care – PPO

## 2021-12-04 DIAGNOSIS — E1169 Type 2 diabetes mellitus with other specified complication: Secondary | ICD-10-CM | POA: Diagnosis not present

## 2021-12-04 DIAGNOSIS — E890 Postprocedural hypothyroidism: Secondary | ICD-10-CM

## 2021-12-04 DIAGNOSIS — E559 Vitamin D deficiency, unspecified: Secondary | ICD-10-CM | POA: Diagnosis not present

## 2021-12-04 DIAGNOSIS — E669 Obesity, unspecified: Secondary | ICD-10-CM | POA: Diagnosis not present

## 2021-12-04 DIAGNOSIS — E785 Hyperlipidemia, unspecified: Secondary | ICD-10-CM

## 2021-12-04 DIAGNOSIS — E782 Mixed hyperlipidemia: Secondary | ICD-10-CM

## 2021-12-04 LAB — COMPREHENSIVE METABOLIC PANEL
ALT: 35 U/L (ref 0–35)
AST: 31 U/L (ref 0–37)
Albumin: 4.4 g/dL (ref 3.5–5.2)
Alkaline Phosphatase: 88 U/L (ref 39–117)
BUN: 14 mg/dL (ref 6–23)
CO2: 31 mEq/L (ref 19–32)
Calcium: 9.1 mg/dL (ref 8.4–10.5)
Chloride: 103 mEq/L (ref 96–112)
Creatinine, Ser: 0.95 mg/dL (ref 0.40–1.20)
GFR: 63.83 mL/min (ref >60.00)
Glucose, Bld: 151 mg/dL — ABNORMAL HIGH (ref 70–99)
Potassium: 4.5 mEq/L (ref 3.5–5.1)
Sodium: 141 mEq/L (ref 135–145)
Total Bilirubin: 0.4 mg/dL (ref 0.2–1.2)
Total Protein: 6.4 g/dL (ref 6.0–8.3)

## 2021-12-04 LAB — LIPID PANEL
Cholesterol: 159 mg/dL (ref 0–200)
HDL: 52 mg/dL (ref >39.00)
NonHDL: 107.08
Total CHOL/HDL Ratio: 3
Triglycerides: 226 mg/dL — ABNORMAL HIGH (ref 0.0–149.0)
VLDL: 45.2 mg/dL — ABNORMAL HIGH (ref 0.0–40.0)

## 2021-12-04 LAB — VITAMIN D 25 HYDROXY (VIT D DEFICIENCY, FRACTURES): VITD: 27.85 ng/mL — ABNORMAL LOW (ref 30.00–100.00)

## 2021-12-04 LAB — LDL CHOLESTEROL, DIRECT: Direct LDL: 84 mg/dL

## 2021-12-04 LAB — T4, FREE: Free T4: 0.84 ng/dL (ref 0.60–1.60)

## 2021-12-04 LAB — HEMOGLOBIN A1C: Hgb A1c MFr Bld: 6.1 % (ref 4.6–6.5)

## 2021-12-04 LAB — TSH: TSH: 0.99 u[IU]/mL (ref 0.35–5.50)

## 2021-12-04 NOTE — Telephone Encounter (Signed)
Pt came to get endo labs done here.

## 2021-12-10 ENCOUNTER — Ambulatory Visit: Payer: BC Managed Care – PPO | Admitting: Endocrinology

## 2021-12-10 NOTE — Progress Notes (Deleted)
Patient ID: Lindsay Fry, female   DOB: [birth year], 61 y.o.   MRN: [id number]   Reason for Appointment:   followup visit  History of Present Illness:  DIABETES:  Prior history: With her high A1c of 6.7 in 03/2014 she had an abnormal glucose tolerance test indicating diabetes and 1 hour glucose of 300 She was started on metformin; however even with 1500 mg of metformin ER  blood sugars were not controlled especially fasting Subsequently with taking Janumet XR 100/1000 daily her blood sugars were much better Because of her weight gain and A1c going up to 7.3 along with occasional readings over 200 she was started on Victoza in 11/16 She has had progressive hyperglycemia previously and A1c was up to 8.6% in 4/17  She was started on Levemir insulin in 08/2015 because of progressive rise in blood sugars and inability to control with 3 other agents   Non-insulin hypoglycemic drugs: Ozempic 1 mg weekly; metformin ER 2000 mg daily, Jardiance 25 mg daily  INSULIN regimen: Levemir 60 units at bedtime  A1c is stable at 6.1   Current management, blood sugar patterns and problems identified: She feels like her morning blood sugars about 10-20 mg higher than before Has not checked any readings after dinner However she has been trying to cut back on sweets and desserts compared to last time  She only has diet drinks  Did not bring a monitor for download today Still unable to lose weight and her weight is up 4 pounds since last visit No symptoms of hypoglycemia She had not been able to exercise because of her pool closing but has started back very recently Has been regular with taking Ozempic 1 mg  She thinks she has some morning nausea but this is not new Has been on the same dose of Levemir for some time  GLUCOSE readings from recall     PRE-MEAL Fasting Lunch Dinner Bedtime Overall  Glucose range: 130-140      Mean/median:        Prior   PRE-MEAL Fasting Lunch Dinner  Bedtime Overall  Glucose range: 91-157      Mean/median: 127    124   POST-MEAL PC Breakfast PC Lunch PC Dinner  Glucose range:   138-184  Mean/median:       Previous glucose range 92-158 before first meal with AVERAGE about 120  Weight history:  Wt Readings from Last 3 Encounters:  10/29/21 206 lb 8 oz (93.7 kg)  10/02/21 204 lb (92.5 kg)  09/03/21 201 lb (91.2 kg)   Previous consultation with dietitian: 2016    LABS:  Lab Results  Component Value Date   HGBA1C 6.1 12/04/2021   HGBA1C 6.1 05/29/2021   HGBA1C 6.2 02/12/2021   Lab Results  Component Value Date   MICROALBUR <0.7 10/12/2020   [place] 77 02/12/2021   CREATININE 0.95 12/04/2021   HYPOTHYROIDISM: This was first diagnosed in 1992 after her treatment for Graves' disease with I-131 She has been on relatively large doses of thyroxine supplements She was on 224 mcg since 11/13 and her TSH was normal in 3/14 Subjectively difficult to assess her thyroid since she tends to have fatigue chronically.  In 2014 her dose was reduced to 200 mcg daily and her dose has been fluctuating since then She had required periodic increase in dosage including in 05/2014 when her TSH was 20  She is on 250 g dosage, continues on the brand name LEVOXYL, this is less expensive  than Synthroid  She feels fairly good overall  She has been regular with the Levoxyl every day before breakfast on empty stomach  Since her TSH was high about 3 weeks ago she is not taking an extra 125 mcg on Mondays of her Levoxyl         TSH as follows  Lab Results  Component Value Date   TSH 0.99 12/04/2021   TSH 0.50 05/29/2021   TSH 6.93 (H) 02/12/2021   FREET4 0.84 12/04/2021   FREET4 0.73 02/12/2021   FREET4 1.04 10/12/2020     Allergies as of 12/10/2021       Reactions   Ephedrine Other (See Comments)   Pt becomes hyper   Oxycodone Other (See Comments)   Panic Attack   Aspirin Other (See Comments)   REACTION: aggrivates  colitis Tolerates 81 mg but unable to take doses >81 mg   Erythromycin Other (See Comments)   REACTION: GI upset   Nsaids    REACTION: aggrivate colitis   Other Other (See Comments)   Unable to take due to history of ulcerative colitis   Rosuvastatin Other (See Comments)   REACTION: increased lfts        Medication List        Accurate as of December 10, 2021  9:20 AM. If you have any questions, ask your nurse or doctor.          acetaminophen 650 MG CR tablet Commonly known as: TYLENOL Take 1,300 mg by mouth 2 (two) times daily.   albuterol 108 (90 Base) MCG/ACT inhaler Commonly known as: VENTOLIN HFA INHALE 1 PUFF INTO THE LUNGS EVERY 4 HOURS AS NEEDED FOR WHEEZING   amantadine 100 MG capsule Commonly known as: SYMMETREL Take 100 mg by mouth 2 (two) times daily.   aspirin 81 MG chewable tablet Chew 1 tablet (81 mg total) by mouth daily.   atorvastatin 80 MG tablet Commonly known as: LIPITOR TAKE 1 TABLET BY MOUTH ONCE EVERY EVENING   B-D UF III MINI PEN NEEDLES 31G X 5 MM Misc Generic drug: Insulin Pen Needle USE FOR INJECTIONS TWICE DAILY   Bayer Microlet Lancets lancets USE AS DIRECTED TO CHECK BLOOD SUGAR TWICE DAILY   CALTRATE 600+D3 PO Take 1 tablet by mouth daily. 300 mg Vitamin D3   Co-Enzyme Q-10 100 MG Caps Take 100 mg by mouth daily.   Contour Next Monitor w/Device Kit 1 each by Does not apply route in the morning and at bedtime. Use Contour Next meter to check blood sugar twice daily.   Contour Next Test test strip Generic drug: glucose blood USE AS DIRECTED   CULTURELLE PROBIOTICS PO Take 1 capsule by mouth daily. For woman   cyclobenzaprine 5 MG tablet Commonly known as: FLEXERIL Take 1 tablet (5 mg total) by mouth every 8 (eight) hours as needed for muscle spasms.   dextroamphetamine 15 MG 24 hr capsule Commonly known as: DEXEDRINE SPANSULE Take 15 mg by mouth as needed.   diazepam 10 MG tablet Commonly known as: VALIUM Take  5-10 mg by mouth daily as needed for anxiety.   diphenhydrAMINE 25 MG tablet Commonly known as: BENADRYL Take 50 mg by mouth at bedtime.   docusate sodium 100 MG capsule Commonly known as: COLACE Take 100-300 mg by mouth daily as needed for mild constipation or moderate constipation.   empagliflozin 25 MG Tabs tablet Commonly known as: Jardiance TAKE 1 TABLET(25 MG) BY MOUTH DAILY   ezetimibe 10 MG tablet Commonly  known as: ZETIA TAKE 1 TABLET(10 MG) BY MOUTH DAILY   famotidine 40 MG tablet Commonly known as: PEPCID Take 40 mg by mouth 2 (two) times daily. 30 min. Before meal and at bedtime   fluticasone 50 MCG/ACT nasal spray Commonly known as: FLONASE Place 1 spray into both nostrils daily.   gabapentin 300 MG capsule Commonly known as: NEURONTIN TAKE 1 CAPSULE BY MOUTH 3 TIMES DAILY   ketoconazole 2 % cream Commonly known as: NIZORAL Apply to affected areas one to two times daily as needed.   KRILL OIL PO Take by mouth.   lamoTRIgine 200 MG tablet Commonly known as: LAMICTAL Take 400 mg by mouth daily.   latanoprost 0.005 % ophthalmic solution Commonly known as: XALATAN Place 1 drop into both eyes 2 (two) times a week.   Levemir FlexPen 100 UNIT/ML FlexPen Generic drug: insulin detemir USE 60 UNITS SUBCUTANEOUSLY ONCE EVERY EVENING AS DIRECTED   Levoxyl 125 MCG tablet Generic drug: levothyroxine 2 tablets Tuesday- Sunday before breakfast and 3 tablets before breakfast on Monday   loperamide 2 MG tablet Commonly known as: IMODIUM A-D Take 2 mg by mouth daily as needed for diarrhea or loose stools.   Melatonin 10 MG Caps Take 10 mg by mouth at bedtime.   mesalamine 1.2 g EC tablet Commonly known as: LIALDA TAKE 2 TABLETS BY MOUTH ONCE DAILY   metoprolol succinate 25 MG 24 hr tablet Commonly known as: TOPROL-XL Take 0.5 tablets (12.5 mg total) by mouth daily.   multivitamin tablet Take 1 tablet by mouth daily. Woman 50 plus   nitroGLYCERIN 0.4 MG  SL tablet Commonly known as: NITROSTAT Place 1 tablet (0.4 mg total) under the tongue every 5 (five) minutes as needed for chest pain.   NovoLOG FlexPen 100 UNIT/ML FlexPen Generic drug: insulin aspart Use 10 to 15 units when blood sugars are more than 200   olmesartan 5 MG tablet Commonly known as: BENICAR TAKE 1 TABLET BY MOUTH ONCE DAILY   ondansetron 4 MG disintegrating tablet Commonly known as: ZOFRAN-ODT DISSOLVE 1 TABLET(4 MG) ON THE TONGUE EVERY 6 HOURS AS NEEDED FOR NAUSEA OR VOMITING   pantoprazole 40 MG tablet Commonly known as: PROTONIX TAKE 1 TABLET(40 MG) BY MOUTH TWICE DAILY   polyethylene glycol 17 g packet Commonly known as: MIRALAX / GLYCOLAX Take 17 g by mouth 3 (three) times daily as needed for moderate constipation, mild constipation or severe constipation. In water or beverage   QUEtiapine 400 MG 24 hr tablet Commonly known as: SEROQUEL XR Take 800 mg by mouth at bedtime.   simethicone 80 MG chewable tablet Commonly known as: MYLICON Chew 80 mg by mouth 4 (four) times daily.   Tavaborole 5 % Soln Commonly known as: Kerydin Apply 1 application topically at bedtime. Apply to affected area of left great toenail   tirzepatide 5 MG/0.5ML Pen Commonly known as: MOUNJARO Inject 5 mg into the skin once a week.   valACYclovir 1000 MG tablet Commonly known as: VALTREX TAKE 1 TABLET BY MOUTH ONCE DAILY, take one tablet po bid x 5 days prn outbreak   VITAMIN B COMPLEX PO Take 1 tablet by mouth every other day.   VITAMIN C PO Take 1,000 mg by mouth daily.   Vraylar 3 MG capsule Generic drug: cariprazine Take 3 mg by mouth at bedtime.   zaleplon 10 MG capsule Commonly known as: SONATA Take 10-20 mg by mouth at bedtime.        Allergies:  Allergies  Allergen Reactions   Ephedrine Other (See Comments)    Pt becomes hyper   Oxycodone Other (See Comments)    Panic Attack   Aspirin Other (See Comments)    REACTION: aggrivates colitis Tolerates  81 mg but unable to take doses >81 mg   Erythromycin Other (See Comments)    REACTION: GI upset   Nsaids     REACTION: aggrivate colitis   Other Other (See Comments)    Unable to take due to history of ulcerative colitis   Rosuvastatin Other (See Comments)    REACTION: increased lfts    Past Medical History:  Diagnosis Date   ADD (attention deficit disorder) 11/19/2012   Allergic rhinitis, cause unspecified    Anxiety state, unspecified    Arthritis    Asymptomatic postmenopausal status (age-related) (natural)    Atypical hyperplasia of left breast 2000   Atypical mole 03/16/2018   R paraspinal mid upper back, excision   Atypical mole 02/17/2018   R spinal upper back, moderate   Atypical mole 08/11/2017   L sternum, excision   Atypical mole 05/27/2017   L post neck, moderate   Benign neoplasm of other and unspecified site of the digestive system    Bipolar disorder ([place])    Breast cancer ([place])    Left Breast 81m invasive CA left uiq, dcis left uoq and a lot ADH, ALH in both breasts.   CAD (coronary artery disease)    a. 08/2014 NSTEMI/Cath: LM nl, LAD 50p, D1/D2 min irregs, LCX nl, OM1 40, LPDA nl, RI 99ost (2.061mvessel->med Rx), RCA nl, AM 60, nl EF; b. 11/2016 MV: EF 74%, no ischemia (performed 2/2 dyspnea & new inf TWI).   Carcinoma of left breast ([facility]   Cataract    Chronic diastolic CHF (congestive heart failure) ([facility]   a. 08/2014 Echo: EF 60-65%, Gr 1 DD, nl LA.   Clotting disorder ([facility]   on blood thiner, Plavix   Cyst    on Achilles tendon   Depression    Diabetes mellitus ([facility]   frank   Dysuria    Edema    Family history of malignant neoplasm of gastrointestinal tract    Fibromyalgia    GERD (gastroesophageal reflux disease)    Glaucoma    pre glaucoma, 05/14/2018 pt. denies glaucoma   Headache    migraines   Hiatal hernia    History of alcoholism ([facility]   History of migraines    History of ovarian cyst    Hx of adenomatous colonic polyps     Insomnia, unspecified    Myocardial infarction ([facility]16   OCD (obsessive compulsive disorder)    Osteopenia 09/25/2016   Femoral neck T -1.1  9/18   Other screening mammogram    Pure hypercholesterolemia    Rosacea    Subacute confusional state 11/19/2012   Tubular adenoma of colon 01/10/11   Ulcerative colitis, unspecified    Unspecified asthma(493.90)    Unspecified essential hypertension    Unspecified hypothyroidism    Unspecified vitamin D deficiency    Vertigo     Past Surgical History:  Procedure Laterality Date   APPENDECTOMY     AXILLARY SENTINEL NODE BIOPSY Left 05/23/2014   Procedure: AXILLARY SENTINEL NODE BIOPSY;  Surgeon: SeChristene LyeMD;  Location: [facility];  Service: General;  Laterality: Left;   BREAST BIOPSY  08/1998   on tamoxifen, atypical hyperplasia   BREAST BIOPSY Left 04/2014   BREAST  SURGERY Left 08/1998   lumpectomy/ Dr Sharlet Salina   BREAST SURGERY Bilateral 05/23/2014   Mastectomy   BUNIONECTOMY Right    CARDIAC CATHETERIZATION N/A 09/05/2014   Procedure: Left Heart Cath and Coronary Angiography;  Surgeon: Wellington Hampshire, MD;  Location: [facility];  Service: Cardiovascular;  Laterality: N/A;   CARDIAC CATHETERIZATION  09/05/2014   CATARACT EXTRACTION W/ INTRAOCULAR LENS IMPLANT Bilateral    CESAREAN SECTION  1996/1997   placenta previa, gest DM, pre-eclampsia   CHOLECYSTECTOMY  1997   adhesions also   COLONOSCOPY  01/2001   Ulcerative colitis   COLONOSCOPY  04/2004   UC, polyp   COLONOSCOPY  12/2006   UC, no polyps   CORONARY STENT INTERVENTION N/A 04/06/2020   Procedure: CORONARY STENT INTERVENTION;  Surgeon: Nelva Bush, MD;  Location: [place] [facility];  Service: Cardiovascular;  Laterality: N/A;   DEXA  04/1999 and 2010   normal   ESOPHAGOGASTRODUODENOSCOPY  09/2001   polyp   exercise stress test  11/2003   negative   FEMUR FRACTURE SURGERY Left    LEFT HEART CATH AND CORONARY ANGIOGRAPHY N/A  04/06/2020   Procedure: LEFT HEART CATH AND CORONARY ANGIOGRAPHY;  Surgeon: Minna Merritts, MD;  Location: [place] [facility];  Service: Cardiovascular;  Laterality: N/A;   MASTECTOMY Bilateral    MECKEL DIVERTICULUM EXCISION  1999   NASAL SINUS SURGERY  05/1997   nuclear stress test  06/2007   negative   precancerous mole removed     RIGHT AND LEFT HEART CATH     with 1 stent placed in March 2022   SIMPLE MASTECTOMY WITH AXILLARY SENTINEL NODE BIOPSY Bilateral 05/23/2014   Procedure: Bilateral simple mastectomy, left sentinel node biopsy ;  Surgeon: Christene Lye, MD;  Location: [facility];  Service: General;  Laterality: Bilateral;   Sleep study  11/2007   no apnea, but did snore (done by [facility])   [place] EXTRACTION  33's    Family History  Problem Relation Age of Onset   Coronary artery disease Father    Colon cancer Father    Alzheimer's disease Father    Dementia Father    Colon polyps Father    Coronary artery disease Mother    Hypertension Mother    Osteoporosis Mother    Crohn's disease Mother        crohns colitis   Breast cancer Other        great aunts   Coronary artery disease Other        Uncle (also AAA)   Diabetes Other        remote family history   Stroke Cousin    Esophageal cancer Neg Hx    Rectal cancer Neg Hx    Stomach cancer Neg Hx     Social History:  reports that she quit smoking about 28 years ago. Her smoking use included cigarettes. She has a 1.25 pack-year smoking history. She has never used smokeless tobacco. She reports that she does not drink alcohol and does not use drugs.  REVIEW Of SYSTEMS:  Bone health: She is taking OTC vitamin D3, ? 2000 units and has been on calcium recommended by her oncologist Vitamin D level has been normal  Bone density in 9/15 shows T score -1.1 at the hip  Has variable calcium levels  Lab Results  Component Value Date   CALCIUM 9.1  12/04/2021  PHOS 2.3 01/29/2007   Lab Results  Component Value Date   VD25OH 27.85 (L) 12/04/2021   VD25OH 43.86 03/24/2020   VD25OH 41.98 06/03/2019     HYPERTENSION: Blood pressure is managed by cardiologist.   She is also on Jardiance  Lab Results  Component Value Date   CREATININE 0.95 12/04/2021   BUN 14 12/04/2021   NA 141 12/04/2021   K 4.5 12/04/2021   CL 103 12/04/2021   CO2 31 12/04/2021    History of hypercholesterolemia with known CAD: Management by cardiologist and has been on 80 mg atorvastatin since her MI She is also taking Zetia that was added subsequently Repatha was approved but she was told by cardiologist not to start this  Although she thinks her diet is somewhat better her LDL is still over 70 Has been taking her medication regularly   Lab Results  Component Value Date   CHOL 159 12/04/2021   CHOL 166 02/12/2021   CHOL 122 06/14/2020   Lab Results  Component Value Date   HDL 52.00 12/04/2021   HDL 50.70 02/12/2021   HDL 45.80 06/14/2020   Lab Results  Component Value Date   LDLCALC 77 02/12/2021   [place] II 55 06/14/2020   [place] 46 03/07/2020   Lab Results  Component Value Date   TRIG 226.0 (H) 12/04/2021   TRIG 194.0 (H) 02/12/2021   TRIG 106.0 06/14/2020   Lab Results  Component Value Date   CHOLHDL 3 12/04/2021   CHOLHDL 3 02/12/2021   CHOLHDL 3 06/14/2020   Lab Results  Component Value Date   LDLDIRECT 84.0 12/04/2021   LDLDIRECT 72.0 05/29/2021   LDLDIRECT 125.0 02/07/2020     ?  Fatty liver: Liver functions show variable results   Lab Results  Component Value Date   ALT 35 12/04/2021      Examination:   LMP 08/23/2006 (Approximate) Comment: tubal ligation      Assessments   DIABETES with obesity:  See history of present illness for evaluation of  current management, blood sugar patterns and problems identified  Her A1c is stable at 6.1   She is on a regimen of basal insulin using 60 units of  Levemir, Jardiance 25 mg and Ozempic 1 mg  She has a tendency to weight gain and this may be partly from not exercising However may benefit from higher dose of Ozempic and will try 2 mg weekly She will let us know if she has any excessive nausea with this  Likely can continue the same dose of Levemir unless morning sugars range Discussed need to check blood sugars periodically after dinner even if it is 1 hour later She will continue to try and improve her diet Also will benefit from the exercise that she has started now  Hypothyroidism, post ablative:  TSH is back to normal, usually symptoms not correlating with her thyroid levels  Continue 250 mg of LEVOXYL daily with extra 125 mcg once a week  LIPIDS: She still has relatively high LDL and will start Repatha instead of Zetia She will let us know if she has any difficulties with this   Follow-up in 4 months      There are no Patient Instructions on file for this visit.    Elayne Snare 12/10/2021, 9:20 AM   Note: This office note was prepared with Dragon voice recognition system technology. Any transcriptional errors that result from this process are unintentional.

## 2021-12-21 ENCOUNTER — Encounter: Payer: Self-pay | Admitting: Endocrinology

## 2021-12-21 ENCOUNTER — Ambulatory Visit: Payer: BC Managed Care – PPO | Admitting: Endocrinology

## 2021-12-21 VITALS — BP 138/82 | HR 88 | Ht 66.0 in | Wt 207.0 lb

## 2021-12-21 DIAGNOSIS — E782 Mixed hyperlipidemia: Secondary | ICD-10-CM

## 2021-12-21 DIAGNOSIS — E890 Postprocedural hypothyroidism: Secondary | ICD-10-CM

## 2021-12-21 DIAGNOSIS — E1165 Type 2 diabetes mellitus with hyperglycemia: Secondary | ICD-10-CM | POA: Diagnosis not present

## 2021-12-21 DIAGNOSIS — E559 Vitamin D deficiency, unspecified: Secondary | ICD-10-CM

## 2021-12-21 DIAGNOSIS — Z794 Long term (current) use of insulin: Secondary | ICD-10-CM

## 2021-12-21 MED ORDER — TIRZEPATIDE 5 MG/0.5ML ~~LOC~~ SOAJ: 5.0000 mg | SUBCUTANEOUS | 0 refills | Status: DC

## 2021-12-21 MED ORDER — TIRZEPATIDE 10 MG/0.5ML ~~LOC~~ SOAJ: 10.0000 mg | SUBCUTANEOUS | 1 refills | Status: DC

## 2021-12-21 NOTE — Progress Notes (Unsigned)
Patient ID: Lindsay Fry, female   DOB: 21-Dec-1960, 61 y.o.   MRN: [id number]   Reason for Appointment:   followup visit  History of Present Illness:  DIABETES:  Prior history: With her high A1c of 6.7 in 03/2014 she had an abnormal glucose tolerance test indicating diabetes and 1 hour glucose of 300 She was started on metformin; however even with 1500 mg of metformin ER  blood sugars were not controlled especially fasting Subsequently with taking Janumet XR 100/1000 daily her blood sugars were much better Because of her weight gain and A1c going up to 7.3 along with occasional readings over 200 she was started on Victoza in 11/16 She has had progressive hyperglycemia previously and A1c was up to 8.6% in 4/17  She was started on Levemir insulin in 08/2015 because of progressive rise in blood sugars and inability to control with 3 other agents   Non-insulin hypoglycemic drugs: Ozempic/Mounjaro: See below; metformin ER 2000 mg daily, Jardiance 25 mg daily   INSULIN regimen: Levemir 60 units at bedtime  A1c is stable at 6.1   Current management, blood sugar patterns and problems identified: She had difficulty getting supplies of Ozempic and was given a trial of Mounjaro 5 mg However she has not had a refill on this for the last 2 to 3 weeks She says she did have mild nausea initially  Recently has not checked her blood sugars much at all and only sporadically in the last week or so  She does have readings as high as 173 fasting but this was from forgetting her Levemir the night before Otherwise fasting readings are relatively higher but blood sugars as low as 87 later in the day Has not checked any readings after dinner Generally trying to watch her diet but less consistently when she is not on her GLP-1 She only has diet drinks  Still unable to lose weight and her weight is up 4 pounds since last visit No symptoms of hypoglycemia She had not been able to exercise  regularly because of her pulm closing   GLUCOSE readings from download as above Recent fasting average 138, nonfasting range 87-143  Prior   PRE-MEAL Fasting Lunch Dinner Bedtime Overall  Glucose range: 91-157      Mean/median: 127    124   POST-MEAL PC Breakfast PC Lunch PC Dinner  Glucose range:   138-184  Mean/median:       Previous glucose range 92-158 before first meal with AVERAGE about 120  Weight history:  Wt Readings from Last 3 Encounters:  12/21/21 207 lb (93.9 kg)  10/29/21 206 lb 8 oz (93.7 kg)  10/02/21 204 lb (92.5 kg)   Previous consultation with dietitian: 2016    LABS:  Lab Results  Component Value Date   HGBA1C 6.1 12/04/2021   HGBA1C 6.1 05/29/2021   HGBA1C 6.2 02/12/2021   Lab Results  Component Value Date   MICROALBUR <0.7 10/12/2020   [place] [address] 02/12/2021   CREATININE 0.95 12/04/2021   HYPOTHYROIDISM: This was first diagnosed in 1992 after her treatment for Graves' disease with I-131 She has been on relatively large doses of thyroxine supplements She was on 224 mcg since 11/13 and her TSH was normal in 3/14 Subjectively difficult to assess her thyroid since she tends to have fatigue chronically.  In 2014 her dose was reduced to 200 mcg daily and her dose has been fluctuating since then She had required periodic increase in dosage including in 05/2014  when her TSH was 20  She is on 250 g dosage, continues on the brand name LEVOXYL, this is less expensive than Synthroid  She feels fairly good overall  She has been regular with the Levoxyl every day before breakfast on empty stomach  Since her TSH was high about 3 weeks ago she is not taking an extra 125 mcg on Mondays of her Levoxyl         TSH as follows  Lab Results  Component Value Date   TSH 0.99 12/04/2021   TSH 0.50 05/29/2021   TSH 6.93 (H) 02/12/2021   FREET4 0.84 12/04/2021   FREET4 0.73 02/12/2021   FREET4 1.04 10/12/2020     Allergies as of 12/21/2021        Reactions   Ephedrine Other (See Comments)   Pt becomes hyper   Oxycodone Other (See Comments)   Panic Attack   Aspirin Other (See Comments)   REACTION: aggrivates colitis Tolerates 81 mg but unable to take doses >81 mg   Erythromycin Other (See Comments)   REACTION: GI upset   Nsaids    REACTION: aggrivate colitis   Other Other (See Comments)   Unable to take due to history of ulcerative colitis   Rosuvastatin Other (See Comments)   REACTION: increased lfts        Medication List        Accurate as of December 21, 2021 11:59 PM. If you have any questions, ask your nurse or doctor.          acetaminophen 650 MG CR tablet Commonly known as: TYLENOL Take 1,300 mg by mouth 2 (two) times daily.   albuterol 108 (90 Base) MCG/ACT inhaler Commonly known as: VENTOLIN HFA INHALE 1 PUFF INTO THE LUNGS EVERY 4 HOURS AS NEEDED FOR WHEEZING   amantadine 100 MG capsule Commonly known as: SYMMETREL Take 100 mg by mouth 2 (two) times daily.   aspirin 81 MG chewable tablet Chew 1 tablet (81 mg total) by mouth daily.   atorvastatin 80 MG tablet Commonly known as: LIPITOR TAKE 1 TABLET BY MOUTH ONCE EVERY EVENING   B-D UF III MINI PEN NEEDLES 31G X 5 MM Misc Generic drug: Insulin Pen Needle USE FOR INJECTIONS TWICE DAILY   Bayer Microlet Lancets lancets USE AS DIRECTED TO CHECK BLOOD SUGAR TWICE DAILY   CALTRATE 600+D3 PO Take 1 tablet by mouth daily. 300 mg Vitamin D3   Co-Enzyme Q-10 100 MG Caps Take 100 mg by mouth daily.   Contour Next Monitor w/Device Kit 1 each by Does not apply route in the morning and at bedtime. Use Contour Next meter to check blood sugar twice daily.   Contour Next Test test strip Generic drug: glucose blood USE AS DIRECTED   CULTURELLE PROBIOTICS PO Take 1 capsule by mouth daily. For woman   cyclobenzaprine 5 MG tablet Commonly known as: FLEXERIL Take 1 tablet (5 mg total) by mouth every 8 (eight) hours as needed for muscle spasms.    dextroamphetamine 15 MG 24 hr capsule Commonly known as: DEXEDRINE SPANSULE Take 15 mg by mouth as needed.   diazepam 10 MG tablet Commonly known as: VALIUM Take 5-10 mg by mouth daily as needed for anxiety.   diphenhydrAMINE 25 MG tablet Commonly known as: BENADRYL Take 50 mg by mouth at bedtime.   docusate sodium 100 MG capsule Commonly known as: COLACE Take 100-300 mg by mouth daily as needed for mild constipation or moderate constipation.   empagliflozin 25  MG Tabs tablet Commonly known as: Jardiance TAKE 1 TABLET(25 MG) BY MOUTH DAILY   ezetimibe 10 MG tablet Commonly known as: ZETIA TAKE 1 TABLET(10 MG) BY MOUTH DAILY   famotidine 40 MG tablet Commonly known as: PEPCID Take 40 mg by mouth 2 (two) times daily. 30 min. Before meal and at bedtime   fluticasone 50 MCG/ACT nasal spray Commonly known as: FLONASE Place 1 spray into both nostrils daily.   gabapentin 300 MG capsule Commonly known as: NEURONTIN TAKE 1 CAPSULE BY MOUTH 3 TIMES DAILY   ketoconazole 2 % cream Commonly known as: NIZORAL Apply to affected areas one to two times daily as needed.   KRILL OIL PO Take by mouth.   lamoTRIgine 200 MG tablet Commonly known as: LAMICTAL Take 400 mg by mouth daily.   latanoprost 0.005 % ophthalmic solution Commonly known as: XALATAN Place 1 drop into both eyes 2 (two) times a week.   Levemir FlexPen 100 UNIT/ML FlexPen Generic drug: insulin detemir USE 60 UNITS SUBCUTANEOUSLY ONCE EVERY EVENING AS DIRECTED   Levoxyl 125 MCG tablet Generic drug: levothyroxine 2 tablets Tuesday- Sunday before breakfast and 3 tablets before breakfast on Monday   loperamide 2 MG tablet Commonly known as: IMODIUM A-D Take 2 mg by mouth daily as needed for diarrhea or loose stools.   Melatonin 10 MG Caps Take 10 mg by mouth at bedtime.   mesalamine 1.2 g EC tablet Commonly known as: LIALDA TAKE 2 TABLETS BY MOUTH ONCE DAILY   metoprolol succinate 25 MG 24 hr  tablet Commonly known as: TOPROL-XL Take 0.5 tablets (12.5 mg total) by mouth daily.   multivitamin tablet Take 1 tablet by mouth daily. Woman 50 plus   nitroGLYCERIN 0.4 MG SL tablet Commonly known as: NITROSTAT Place 1 tablet (0.4 mg total) under the tongue every 5 (five) minutes as needed for chest pain.   NovoLOG FlexPen 100 UNIT/ML FlexPen Generic drug: insulin aspart Use 10 to 15 units when blood sugars are more than 200   olmesartan 5 MG tablet Commonly known as: BENICAR TAKE 1 TABLET BY MOUTH ONCE DAILY   ondansetron 4 MG disintegrating tablet Commonly known as: ZOFRAN-ODT DISSOLVE 1 TABLET(4 MG) ON THE TONGUE EVERY 6 HOURS AS NEEDED FOR NAUSEA OR VOMITING   pantoprazole 40 MG tablet Commonly known as: PROTONIX TAKE 1 TABLET(40 MG) BY MOUTH TWICE DAILY   polyethylene glycol 17 g packet Commonly known as: MIRALAX / GLYCOLAX Take 17 g by mouth 3 (three) times daily as needed for moderate constipation, mild constipation or severe constipation. In water or beverage   QUEtiapine 400 MG 24 hr tablet Commonly known as: SEROQUEL XR Take 800 mg by mouth at bedtime.   simethicone 80 MG chewable tablet Commonly known as: MYLICON Chew 80 mg by mouth 4 (four) times daily.   Tavaborole 5 % Soln Commonly known as: Kerydin Apply 1 application topically at bedtime. Apply to affected area of left great toenail   tirzepatide 5 MG/0.5ML Pen Commonly known as: MOUNJARO Inject 5 mg into the skin once a week. What changed: Another medication with the same name was added. Make sure you understand how and when to take each. Changed by: Elayne Snare, MD   tirzepatide 10 MG/0.5ML Pen Commonly known as: MOUNJARO Inject 10 mg into the skin once a week. What changed: You were already taking a medication with the same name, and this prescription was added. Make sure you understand how and when to take each. Changed by: Vicenta Aly  Dwyane Dee, MD   valACYclovir 1000 MG tablet Commonly known as:  VALTREX TAKE 1 TABLET BY MOUTH ONCE DAILY, take one tablet po bid x 5 days prn outbreak   VITAMIN B COMPLEX PO Take 1 tablet by mouth every other day.   VITAMIN C PO Take 1,000 mg by mouth daily.   Vraylar 3 MG capsule Generic drug: cariprazine Take 3 mg by mouth at bedtime.   zaleplon 10 MG capsule Commonly known as: SONATA Take 10-20 mg by mouth at bedtime.        Allergies:  Allergies  Allergen Reactions   Ephedrine Other (See Comments)    Pt becomes hyper   Oxycodone Other (See Comments)    Panic Attack   Aspirin Other (See Comments)    REACTION: aggrivates colitis Tolerates 81 mg but unable to take doses >81 mg   Erythromycin Other (See Comments)    REACTION: GI upset   Nsaids     REACTION: aggrivate colitis   Other Other (See Comments)    Unable to take due to history of ulcerative colitis   Rosuvastatin Other (See Comments)    REACTION: increased lfts    Past Medical History:  Diagnosis Date   ADD (attention deficit disorder) 11/19/2012   Allergic rhinitis, cause unspecified    Anxiety state, unspecified    Arthritis    Asymptomatic postmenopausal status (age-related) (natural)    Atypical hyperplasia of left breast 2000   Atypical mole 03/16/2018   R paraspinal mid upper back, excision   Atypical mole 02/17/2018   R spinal upper back, moderate   Atypical mole 08/11/2017   L sternum, excision   Atypical mole 05/27/2017   L post neck, moderate   Benign neoplasm of other and unspecified site of the digestive system    Bipolar disorder (HCC)    Breast cancer ([place])    Left Breast 82m invasive CA left uiq, dcis left uoq and a lot ADH, ALH in both breasts.   CAD (coronary artery disease)    a. 08/2014 NSTEMI/Cath: LM nl, LAD 50p, D1/D2 min irregs, LCX nl, OM1 40, LPDA nl, RI 99ost (2.043mvessel->med Rx), RCA nl, AM 60, nl EF; b. 11/2016 MV: EF 74%, no ischemia (performed 2/2 dyspnea & new inf TWI).   Carcinoma of left breast ([place]/19/2016   Cataract     Chronic diastolic CHF (congestive heart failure) ([facility]   a. 08/2014 Echo: EF 60-65%, Gr 1 DD, nl LA.   Clotting disorder ([place]   on blood thiner, Plavix   Cyst    on Achilles tendon   Depression    Diabetes mellitus ([facility]CD[place]   frank   Dysuria    Edema    Family history of malignant neoplasm of gastrointestinal tract    Fibromyalgia    GERD (gastroesophageal reflux disease)    Glaucoma    pre glaucoma, 05/14/2018 pt. denies glaucoma   Headache    migraines   Hiatal hernia    History of alcoholism ([place]   History of migraines    History of ovarian cyst    Hx of adenomatous colonic polyps    Insomnia, unspecified    Myocardial infarction ([place]8-21-16   OCD (obsessive compulsive disorder)    Osteopenia 09/25/2016   Femoral neck T -1.1  9/18   Other screening mammogram    Pure hypercholesterolemia    Rosacea    Subacute confusional state 11/19/2012   Tubular adenoma of colon 01/10/11  Ulcerative colitis, unspecified    Unspecified asthma(493.90)    Unspecified essential hypertension    Unspecified hypothyroidism    Unspecified vitamin D deficiency    Vertigo     Past Surgical History:  Procedure Laterality Date   APPENDECTOMY     AXILLARY SENTINEL NODE BIOPSY Left 05/23/2014   Procedure: AXILLARY SENTINEL NODE BIOPSY;  Surgeon: Christene Lye, MD;  Location: [facility];  Service: General;  Laterality: Left;   BREAST BIOPSY  08/1998   on tamoxifen, atypical hyperplasia   BREAST BIOPSY Left 04/2014   BREAST SURGERY Left 08/1998   lumpectomy/ Dr Sharlet Salina   BREAST SURGERY Bilateral 05/23/2014   Mastectomy   BUNIONECTOMY Right    CARDIAC CATHETERIZATION N/A 09/05/2014   Procedure: Left Heart Cath and Coronary Angiography;  Surgeon: Wellington Hampshire, MD;  Location: [place] [facility];  Service: Cardiovascular;  Laterality: N/A;   CARDIAC CATHETERIZATION  09/05/2014   CATARACT EXTRACTION W/ INTRAOCULAR LENS IMPLANT Bilateral    CESAREAN SECTION  1996/1997    placenta previa, gest DM, pre-eclampsia   CHOLECYSTECTOMY  1997   adhesions also   COLONOSCOPY  01/2001   Ulcerative colitis   COLONOSCOPY  04/2004   UC, polyp   COLONOSCOPY  12/2006   UC, no polyps   CORONARY STENT INTERVENTION N/A 04/06/2020   Procedure: CORONARY STENT INTERVENTION;  Surgeon: Nelva Bush, MD;  Location: [place] [facility];  Service: Cardiovascular;  Laterality: N/A;   DEXA  04/1999 and 2010   normal   ESOPHAGOGASTRODUODENOSCOPY  09/2001   polyp   exercise stress test  11/2003   negative   FEMUR FRACTURE SURGERY Left    LEFT HEART CATH AND CORONARY ANGIOGRAPHY N/A 04/06/2020   Procedure: LEFT HEART CATH AND CORONARY ANGIOGRAPHY;  Surgeon: Minna Merritts, MD;  Location: [place] [facility];  Service: Cardiovascular;  Laterality: N/A;   MASTECTOMY Bilateral    MECKEL DIVERTICULUM EXCISION  1999   NASAL SINUS SURGERY  05/1997   nuclear stress test  06/2007   negative   precancerous mole removed     RIGHT AND LEFT HEART CATH     with 1 stent placed in March 2022   SIMPLE MASTECTOMY WITH AXILLARY SENTINEL NODE BIOPSY Bilateral 05/23/2014   Procedure: Bilateral simple mastectomy, left sentinel node biopsy ;  Surgeon: Christene Lye, MD;  Location: [facility];  Service: General;  Laterality: Bilateral;   Sleep study  11/2007   no apnea, but did snore (done by [facility])   Mettler EXTRACTION  33's    Family History  Problem Relation Age of Onset   Coronary artery disease Father    Colon cancer Father    Alzheimer's disease Father    Dementia Father    Colon polyps Father    Coronary artery disease Mother    Hypertension Mother    Osteoporosis Mother    Crohn's disease Mother        crohns colitis   Breast cancer Other        great aunts   Coronary artery disease Other        Uncle (also AAA)   Diabetes Other        remote family history   Stroke Cousin    Esophageal cancer Neg Hx     Rectal cancer Neg Hx    Stomach cancer Neg Hx     Social History:  reports that  she quit smoking about 28 years ago. Her smoking use included cigarettes. She has a 1.25 pack-year smoking history. She has never used smokeless tobacco. She reports that she does not drink alcohol and does not use drugs.  REVIEW Of SYSTEMS:  Bone health: She is taking OTC vitamin D3, 1000 units recently  Vitamin D level   Bone density in 2022 shows T score -1.1 at the hip  Recently calcium normal  Lab Results  Component Value Date   CALCIUM 9.1 12/04/2021   PHOS 2.3 01/29/2007   Lab Results  Component Value Date   VD25OH 27.85 (L) 12/04/2021   VD25OH 43.86 03/24/2020   VD25OH 41.98 06/03/2019     HYPERTENSION: Blood pressure is managed by cardiologist.   She is also on Jardiance  Lab Results  Component Value Date   CREATININE 0.95 12/04/2021   BUN 14 12/04/2021   NA 141 12/04/2021   K 4.5 12/04/2021   CL 103 12/04/2021   CO2 31 12/04/2021    History of hypercholesterolemia with known CAD: Management by cardiologist and has been on 80 mg atorvastatin since her MI She is also taking Zetia that was added subsequently Repatha was approved and she has a supply at home but has not started this  Diet has been fair but LDL continues to increase gradually and triglycerides are also high Has been taking her medication regularly   Lab Results  Component Value Date   CHOL 159 12/04/2021   CHOL 166 02/12/2021   CHOL 122 06/14/2020   Lab Results  Component Value Date   HDL 52.00 12/04/2021   HDL 50.70 02/12/2021   HDL 45.80 06/14/2020   Lab Results  Component Value Date   LDLCALC 77 02/12/2021   LDLCALC 55 06/14/2020   LDLCALC 46 03/07/2020   Lab Results  Component Value Date   TRIG 226.0 (H) 12/04/2021   TRIG 194.0 (H) 02/12/2021   TRIG 106.0 06/14/2020   Lab Results  Component Value Date   CHOLHDL 3 12/04/2021   CHOLHDL 3 02/12/2021   CHOLHDL 3 06/14/2020   Lab Results   Component Value Date   LDLDIRECT 84.0 12/04/2021   LDLDIRECT 72.0 05/29/2021   LDLDIRECT 125.0 02/07/2020     Possible fatty liver: Liver functions back to normal now   Lab Results  Component Value Date   ALT 35 12/04/2021      Examination:   BP 138/82   Pulse 88   Ht _0  (1.676 m)   Wt 207 lb (93.9 kg)   LMP 08/23/2006 (Approximate) Comment: tubal ligation  SpO2 97%   BMI 33.41 kg/m   Diabetic Foot Exam - Simple   Simple Foot Form Diabetic Foot exam was performed with the following findings: Yes   Visual Inspection No deformities, no ulcerations, no other skin breakdown bilaterally: Yes Sensation Testing Intact to touch and monofilament testing bilaterally: Yes Pulse Check Posterior Tibialis and Dorsalis pulse intact bilaterally: Yes Comments        Assessments   DIABETES with obesity:  See history of present illness for evaluation of  current management, blood sugar patterns and problems identified  Her A1c is stable at 6.1   She is on a regimen of basal insulin using 60 units of Levemir, Jardiance 25 mg and Ozempic 1 mg  She has a tendency to weight gain and this may be partly from not exercising Because of issues with getting consistent supplies of Ozempic she is wanting to [employer] and use  a higher dose after going back on 5 mg unless she has nausea Needs to get back into exercise Since she has low normal readings nonfasting will not increase her Levemir   Hypothyroidism, post ablative:  TSH is consistently normal, usually symptoms not correlating with her thyroid levels  Continue 250 mg of LEVOXYL daily with extra 125 mcg once a week  Mixed hyperlipidemia: She still has relatively high LDL and will start Repatha, may be able to stop Zetia She will let us know if she has any difficulties with this We will do fasting labs on the next visit and recheck triglycerides  Vitamin D deficiency: She had been off her supplement and only just  starting back, will need follow-up on the next visit  Follow-up in 4 months      Patient Instructions  Start Repatha every 2 weeks  Check blood sugars on waking up 4 days a week  Also check blood sugars about 2 hours after meals and do this after different meals by rotation  Recommended blood sugar levels on waking up are 90-130 and about 2 hours after meal is 130-160  Please bring your blood sugar monitor to each visit, thank you     .  Elayne Snare 12/22/2021, 11:06 AM   Note: This office note was prepared with Dragon voice recognition system technology. Any transcriptional errors that result from this process are unintentional.

## 2021-12-21 NOTE — Patient Instructions (Addendum)
Start Repatha every 2 weeks  Check blood sugars on waking up 4 days a week  Also check blood sugars about 2 hours after meals and do this after different meals by rotation  Recommended blood sugar levels on waking up are 90-130 and about 2 hours after meal is 130-160  Please bring your blood sugar monitor to each visit, thank you

## 2021-12-26 ENCOUNTER — Encounter: Payer: Self-pay | Admitting: Endocrinology

## 2021-12-26 DIAGNOSIS — E782 Mixed hyperlipidemia: Secondary | ICD-10-CM

## 2021-12-28 MED ORDER — REPATHA SURECLICK 140 MG/ML ~~LOC~~ SOAJ: SUBCUTANEOUS | 2 refills | Status: DC

## 2022-01-02 ENCOUNTER — Other Ambulatory Visit: Payer: Self-pay | Admitting: Internal Medicine

## 2022-01-11 ENCOUNTER — Telehealth: Payer: Self-pay

## 2022-01-11 ENCOUNTER — Other Ambulatory Visit (HOSPITAL_COMMUNITY): Payer: Self-pay

## 2022-01-11 NOTE — Telephone Encounter (Signed)
Pharmacy Patient Advocate Encounter   Received notification from [facility] that prior authorization for REPATHA 140 MG/ML INJ is due for renewal.   PA submitted on 01/11/22 Key [id number] Status is pending    Karie Soda, [place] Patient Advocate Specialist Direct Number: [phone] Fax: [phone]

## 2022-01-15 NOTE — Telephone Encounter (Signed)
Pharmacy Patient Advocate Encounter  Received notification from [facility] that the request for prior authorization for REPATHA 140 MG/ML INJ has been denied due to [place], [place] Patient Advocate Specialist Direct Number: [phone] Fax: [phone]

## 2022-01-16 ENCOUNTER — Other Ambulatory Visit (HOSPITAL_COMMUNITY): Payer: Self-pay

## 2022-01-16 NOTE — Telephone Encounter (Signed)
Patient Advocate Encounter   Resubmitted a prior authorization request for Repatha SureClick '140MG'$ /ML auto-injectors  Submitted: 01-16-2022 Key [id number]  Status is pending

## 2022-01-16 NOTE — Telephone Encounter (Signed)
Attempted to submit new PA, cannot submit new PA within 60 days of denial. Appeal will need to be submitted with new diagnosis.  Please be advised appeals may take up to 5 business days to be submitted as pharmacist prepares necessary documentation. Thank you!  Karie Soda, CPhT Pharmacy Patient Advocate Specialist Direct Number: [phone] Fax: [phone]

## 2022-01-21 NOTE — Telephone Encounter (Addendum)
Pharmacy Patient Advocate Encounter  Received response from [facility] that the request for appeal on the prior authorization for REPATHA 140 MG/ML INJ has been denied due to AN ADDITIONAL RPH REVIEW DETERMINED THAT THE REQUEST DOES NOT MEET THE DEFINITION OF MEDICAL NECESSITY FOUND IN MEMBER BENEFIT BOOKLET.      PLEASE ADVISE  Karie Soda, CPhT Pharmacy Patient Advocate Specialist Direct Number: [phone] Fax: [phone]

## 2022-01-22 ENCOUNTER — Other Ambulatory Visit (HOSPITAL_COMMUNITY): Payer: Self-pay

## 2022-01-22 ENCOUNTER — Encounter: Payer: Self-pay | Admitting: Family Medicine

## 2022-01-22 NOTE — Telephone Encounter (Signed)
Pharmacy Patient Advocate Encounter   Received notification from [facility] that prior authorization for LEQVIO '284MG'$ /1.5ML is needed.    PA submitted on 01/22/22 Key [id number] Status is pending  Karie Soda, [place] Patient Advocate Specialist Direct Number: [phone] Fax: [phone]

## 2022-01-22 NOTE — Telephone Encounter (Signed)
I assume she means that those folks need to see his records?   That is ok with me if that is what he wants.

## 2022-01-24 ENCOUNTER — Other Ambulatory Visit (HOSPITAL_COMMUNITY): Payer: Self-pay

## 2022-01-24 ENCOUNTER — Telehealth: Payer: Self-pay

## 2022-01-24 NOTE — Telephone Encounter (Signed)
Pharmacy Patient Advocate Encounter   Received notification from [facility] that prior authorization fo Contour Next Monitor w/Device kit is required/requested.    PA submitted on 01/24/2022 via CoverMyMeds Key [id number] Status is pending  [place] [place] [facility] Patient Advocate

## 2022-01-24 NOTE — Telephone Encounter (Signed)
Pharmacy Patient Advocate Encounter  Received notification from [facility] that the request for prior authorization for Contour Next Monitor w Device Kit has been denied.    Please be advised we currently do not have a Pharmacist to review denials. If you would like [place] to submit it on your behalf, please provide clinical information to support your reason for appeal and any pertinent information you would like [place] to include with the appeal request. Appeals may take longer 5 business days to be submitted as we prepares necessary documentation. Thanks for your support.  Per test claim, OTC's not covered or service not covered, but It appears there's a voucher from the manufacturer that will allow her to get the machine for free if she hasn't used it already. However, it doesn't tell me what machine is preferred by her ins.

## 2022-01-25 ENCOUNTER — Other Ambulatory Visit (HOSPITAL_COMMUNITY): Payer: Self-pay

## 2022-01-25 NOTE — Telephone Encounter (Signed)
Pharmacy Patient Advocate Encounter   Received notification that prior authorization for Leqvio '284mg'$ /1.53m is required/requested.    PA submitted on 01/25/22 [place] [phone] [id number] is pending  Phone #[phone]

## 2022-01-25 NOTE — Telephone Encounter (Signed)
Called patient no answer. Left vm to call office back

## 2022-01-30 NOTE — Telephone Encounter (Signed)
Pharmacy Patient Advocate Encounter  Received notification from [place]  that the request for prior authorization for LEQVIO '284MG'$ /1.5ML has been denied due to  PATIENT HAS NOT TRIED A PCSK9 INHIBITOR PLEASE [place], [place] Patient Advocate Specialist Direct Number: [phone] Fax: [phone]

## 2022-01-30 NOTE — Telephone Encounter (Signed)
Placed a follow up call to [place] for Leqvio '284mg'$ /1.66m.   The prior auth needed a few questions to be answered.  Representative stated she would go ahead and get the pharmacist to review it as an urgent case and should have determination before 02/02/22.  Phone # [phone] 3, option 4, option 1

## 2022-01-31 ENCOUNTER — Other Ambulatory Visit (HOSPITAL_COMMUNITY): Payer: Self-pay

## 2022-02-06 ENCOUNTER — Encounter: Payer: Self-pay | Admitting: Endocrinology

## 2022-02-07 ENCOUNTER — Other Ambulatory Visit: Payer: Self-pay | Admitting: Internal Medicine

## 2022-02-07 DIAGNOSIS — R110 Nausea: Secondary | ICD-10-CM

## 2022-02-20 ENCOUNTER — Encounter: Payer: Self-pay | Admitting: Endocrinology

## 2022-02-22 ENCOUNTER — Ambulatory Visit
Admission: EM | Admit: 2022-02-22 | Discharge: 2022-02-22 | Disposition: A | Discharge status: Home / Self Care | Payer: BC Managed Care – PPO | Attending: Emergency Medicine | Admitting: Emergency Medicine

## 2022-02-22 ENCOUNTER — Ambulatory Visit (INDEPENDENT_AMBULATORY_CARE_PROVIDER_SITE_OTHER): Payer: BC Managed Care – PPO

## 2022-02-22 ENCOUNTER — Other Ambulatory Visit: Payer: Self-pay

## 2022-02-22 ENCOUNTER — Encounter: Payer: Self-pay | Admitting: Family Medicine

## 2022-02-22 DIAGNOSIS — R051 Acute cough: Secondary | ICD-10-CM

## 2022-02-22 DIAGNOSIS — J209 Acute bronchitis, unspecified: Secondary | ICD-10-CM | POA: Diagnosis not present

## 2022-02-22 DIAGNOSIS — J45901 Unspecified asthma with (acute) exacerbation: Secondary | ICD-10-CM | POA: Diagnosis not present

## 2022-02-22 DIAGNOSIS — E782 Mixed hyperlipidemia: Secondary | ICD-10-CM

## 2022-02-22 DIAGNOSIS — E890 Postprocedural hypothyroidism: Secondary | ICD-10-CM

## 2022-02-22 MED ORDER — PREDNISONE 10 MG PO TABS: 40.0000 mg | ORAL_TABLET | Freq: Every day | ORAL | 0 refills | Status: AC

## 2022-02-22 MED ORDER — REPATHA SURECLICK 140 MG/ML ~~LOC~~ SOAJ: SUBCUTANEOUS | 2 refills | Status: DC

## 2022-02-22 MED ORDER — LEVOXYL 125 MCG PO TABS: ORAL_TABLET | ORAL | 1 refills | Status: DC

## 2022-02-22 MED ORDER — AZITHROMYCIN 250 MG PO TABS: 250.0000 mg | ORAL_TABLET | Freq: Every day | ORAL | 0 refills | Status: DC

## 2022-02-22 NOTE — Telephone Encounter (Signed)
I was able to reach pt on phone and pt said she has hx of asthma with allergies but for 3 days has had wheezing 24/7. Pt is feeling tired and mostly dry cough but once today coughed up yellow mucus size of 50 cent piece. Pt said no fever, no CP or SOB. Covid test neg.  No available appts at [facility] or [facility] this afternoon. Pt said she will ck wait times at [facility] in [place]. Pt said she is too busy to sit for hrs at [facility] and if does not go to UC today pt will go early morning. UC & ED precautions given to pt and pt voiced understanding. Pt said if she worsens she will go to ED and pt will cb next wk with update. Sending note to Dr Glori Bickers, [employer] and will teams [place] CMA.

## 2022-02-22 NOTE — Discharge Instructions (Addendum)
Take the prednisone and Zithromax as directed.  Continue to use your albuterol inhaler as directed.    Follow-up with your primary care provider on Monday.

## 2022-02-22 NOTE — ED Triage Notes (Signed)
Patient to Urgent Care with complaints of wheezing x1 days. Reports a dry cough.   Denies any known fevers/ SHOB. Negative covid test at home.   Hx of asthma, has had to started using her albuterol inhaler every 4 hours today. Reports that it is usually only exacerbated with allergies.

## 2022-02-22 NOTE — Telephone Encounter (Signed)
Lindsay Latin- would you please triage ? Sounds like she needs to be seen

## 2022-02-22 NOTE — ED Provider Notes (Addendum)
Lindsay Fry    CSN: [id number] Arrival date & time: 02/22/22  1555      History   Chief Complaint Chief Complaint  Patient presents with   Wheezing    HPI Lindsay Fry is a 62 y.o. female.  Patient presents with cough and wheezing x 2-3 days.  Treating with albuterol inhaler.  No shortness of breath, chest pain, fever, or other symptoms.  Her medical history includes asthma, diabetes, hypertension, NSTEMI, diastolic heart failure, breast CA, ulcerative colitis, hypothyroidism, vertigo, fibromyalgia, migraine headaches, ADD, OCD, bipolar 1 disorder, mild cognitive impairment, cerebellar ataxia.   The history is provided by the patient and medical records.    Past Medical History:  Diagnosis Date   ADD (attention deficit disorder) 11/19/2012   Allergic rhinitis, cause unspecified    Anxiety state, unspecified    Arthritis    Asymptomatic postmenopausal status (age-related) (natural)    Atypical hyperplasia of left breast 2000   Atypical mole 03/16/2018   R paraspinal mid upper back, excision   Atypical mole 02/17/2018   R spinal upper back, moderate   Atypical mole 08/11/2017   L sternum, excision   Atypical mole 05/27/2017   L post neck, moderate   Benign neoplasm of other and unspecified site of the digestive system    Bipolar disorder ([place])    Breast cancer ([place])    Left Breast 57m invasive CA left uiq, dcis left uoq and a lot ADH, ALH in both breasts.   CAD (coronary artery disease)    a. 08/2014 NSTEMI/Cath: LM nl, LAD 50p, D1/D2 min irregs, LCX nl, OM1 40, LPDA nl, RI 99ost (2.058mvessel->med Rx), RCA nl, AM 60, nl EF; b. 11/2016 MV: EF 74%, no ischemia (performed 2/2 dyspnea & new inf TWI).   Carcinoma of left breast (HCC[place]5/19/2016   Cataract    Chronic diastolic CHF (congestive heart failure) (HCSaticoy   a. 08/2014 Echo: EF 60-65%, Gr 1 DD, nl LA.   Clotting disorder (HCL[place]   on blood thiner, Plavix   Cyst    on Achilles tendon   Depression     Diabetes mellitus (HCFox [place]   frank   Dysuria    Edema    Family history of malignant neoplasm of gastrointestinal tract    Fibromyalgia    GERD (gastroesophageal reflux disease)    Glaucoma    pre glaucoma, 05/14/2018 pt. denies glaucoma   Headache    migraines   Hiatal hernia    History of alcoholism (HCAndrews   History of migraines    History of ovarian cyst    Hx of adenomatous colonic polyps    Insomnia, unspecified    Myocardial infarction (HCJasper8-21-16   OCD (obsessive compulsive disorder)    Osteopenia 09/25/2016   Femoral neck T -1.1  9/18   Other screening mammogram    Pure hypercholesterolemia    Rosacea    Subacute confusional state 11/19/2012   Tubular adenoma of colon 01/10/11   Ulcerative colitis, unspecified    Unspecified asthma(493.90)    Unspecified essential hypertension    Unspecified hypothyroidism    Unspecified vitamin D deficiency    Vertigo     Patient Active Problem List   Diagnosis Date Noted   Stress reaction 10/29/2021   Abscess of left groin 10/02/2021   Callus between toes 02/01/2021   Unstable angina (HCAtwood03/24/2022   Cerebellar ataxia in diseases classified elsewhere (HCMount [place]03/15/2022   Elevated transaminase level 03/27/2020  Current use of proton pump inhibitor 03/19/2020   Hypokalemia 03/07/2020   Aortic atherosclerosis ([place]) 03/07/2020   Double vision 12/14/2019   MCI (mild cognitive impairment) 08/05/2019   Obesity (BMI 30-39.9) 04/23/2019   Dizziness 12/16/2018   Poor balance 12/16/2018   Fatigue 12/16/2018   Migraine 11/12/2018   Uncontrolled type 2 diabetes mellitus with hyperglycemia, with long-term current use of insulin ([place]) 02/19/2018   Pleural thickening 11/05/2017   Mouth ulcers 11/05/2017   Cervical radiculopathy due to degenerative joint disease of spine 10/28/2017   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   Supraventricular tachycardia 04/22/2017   Osteopenia 09/25/2016   Estrogen deficiency 05/30/2016   Need for hepatitis C  screening test 03/19/2016   Carcinoma of overlapping sites of left breast in female, estrogen receptor positive ([place]) 08/21/2015   Chronic constipation 01/18/2015   NSTEMI (non-ST elevated myocardial infarction) ([place]) 09/06/2014   Coronary artery disease involving native coronary artery of native heart with angina pectoris ([place]) 09/06/2014   DM (diabetes mellitus) ([place]) 09/05/2014   History of breast cancer 06/02/2014   Sinus tachycardia 04/27/2014   ADD (attention deficit disorder) 11/19/2012   Shortness of breath 09/02/2012   Low back pain 08/05/2012   Hypothyroid 05/18/2012   Chronic sinusitis 01/24/2012   Vertigo, benign positional 01/24/2012   Other screening mammogram 12/05/2010   Routine general medical examination at a health care facility 10/14/2010   POSTMENOPAUSAL STATUS 06/22/2008   Vitamin D deficiency 03/18/2008   BENIGN NEOPLASM [place] SITE DIGESTIVE SYSTEM 01/09/2007   HIATAL HERNIA 01/09/2007   COLONIC POLYPS, ADENOMATOUS, HX OF 01/09/2007   Hyperlipidemia associated with type 2 diabetes mellitus ([place]) 01/07/2007   Bipolar I disorder, most recent episode depressed ([place]) 01/07/2007   Essential hypertension 01/07/2007   Allergic rhinitis 01/07/2007   ASTHMA 01/07/2007   GERD 01/07/2007   Ulcerative colitis ([place]) 01/07/2007   ACNE ROSACEA 01/07/2007   MIGRAINES, HX OF 01/07/2007   Fibromyalgia 10/30/2006   INSOMNIA 10/30/2006    Past Surgical History:  Procedure Laterality Date   APPENDECTOMY     AXILLARY SENTINEL NODE BIOPSY Left 05/23/2014   Procedure: AXILLARY SENTINEL NODE BIOPSY;  Surgeon: Christene Lye, MD;  Location: [facility] ORS;  Service: General;  Laterality: Left;   BREAST BIOPSY  08/1998   on tamoxifen, atypical hyperplasia   BREAST BIOPSY Left 04/2014   BREAST SURGERY Left 08/1998   lumpectomy/ Dr Sharlet Salina   BREAST SURGERY Bilateral 05/23/2014   Mastectomy   BUNIONECTOMY Right    CARDIAC CATHETERIZATION N/A 09/05/2014   Procedure:  Left Heart Cath and Coronary Angiography;  Surgeon: Wellington Hampshire, MD;  Location: [place] [facility];  Service: Cardiovascular;  Laterality: N/A;   CARDIAC CATHETERIZATION  09/05/2014   CATARACT EXTRACTION W/ INTRAOCULAR LENS IMPLANT Bilateral    CESAREAN SECTION  1996/1997   placenta previa, gest DM, pre-eclampsia   CHOLECYSTECTOMY  1997   adhesions also   COLONOSCOPY  01/2001   Ulcerative colitis   COLONOSCOPY  04/2004   UC, polyp   COLONOSCOPY  12/2006   UC, no polyps   CORONARY STENT INTERVENTION N/A 04/06/2020   Procedure: CORONARY STENT INTERVENTION;  Surgeon: Nelva Bush, MD;  Location: [place] CV LAB;  Service: Cardiovascular;  Laterality: N/A;   DEXA  04/1999 and 2010   normal   ESOPHAGOGASTRODUODENOSCOPY  09/2001   polyp   exercise stress test  11/2003   negative   FEMUR FRACTURE SURGERY Left    LEFT HEART CATH AND CORONARY ANGIOGRAPHY N/A 04/06/2020  Procedure: LEFT HEART CATH AND CORONARY ANGIOGRAPHY;  Surgeon: Minna Merritts, MD;  Location: [place] CV LAB;  Service: Cardiovascular;  Laterality: N/A;   MASTECTOMY Bilateral    MECKEL DIVERTICULUM EXCISION  1999   NASAL SINUS SURGERY  05/1997   nuclear stress test  06/2007   negative   precancerous mole removed     RIGHT AND LEFT HEART CATH     with 1 stent placed in March 2022   SIMPLE MASTECTOMY WITH AXILLARY SENTINEL NODE BIOPSY Bilateral 05/23/2014   Procedure: Bilateral simple mastectomy, left sentinel node biopsy ;  Surgeon: Christene Lye, MD;  Location: [facility] ORS;  Service: General;  Laterality: Bilateral;   Sleep study  11/2007   no apnea, but did snore (done by [facility])   [place] EXTRACTION  1980's    OB History     Gravida  2   Para  2   Term      Preterm      AB      Living         SAB      IAB      Ectopic      Multiple      Live Births           Obstetric Comments  1st Menstrual Cycle:  11  1st  Pregnancy:  33          Home Medications    Prior to Admission medications   Medication Sig Start Date End Date Taking? Authorizing Provider  azithromycin (ZITHROMAX) 250 MG tablet Take 1 tablet (250 mg total) by mouth daily. Take first 2 tablets together, then 1 every day until finished. 02/22/22  Yes Sharion Balloon, NP  predniSONE (DELTASONE) 10 MG tablet Take 4 tablets (40 mg total) by mouth daily for 5 days. 02/22/22 02/27/22 Yes Sharion Balloon, NP  acetaminophen (TYLENOL) 650 MG CR tablet Take 1,300 mg by mouth 2 (two) times daily.    [provider]  albuterol (VENTOLIN HFA) 108 (90 Base) MCG/ACT inhaler INHALE 1 PUFF INTO THE LUNGS EVERY 4 HOURS AS NEEDED FOR WHEEZING    [provider]  amantadine (SYMMETREL) 100 MG capsule Take 100 mg by mouth 2 (two) times daily.    [provider]  Ascorbic Acid (VITAMIN C PO) Take 1,000 mg by mouth daily.    [provider]  aspirin 81 MG chewable tablet Chew 1 tablet (81 mg total) by mouth daily. 06/04/21   Furth, Cadence H, PA-C  atorvastatin (LIPITOR) 80 MG tablet TAKE 1 TABLET BY MOUTH ONCE EVERY EVENING 11/26/21   Minna Merritts, MD  B Complex Vitamins (VITAMIN B COMPLEX PO) Take 1 tablet by mouth every other day.    [provider]  BAYER MICROLET LANCETS lancets USE AS DIRECTED TO CHECK BLOOD SUGAR TWICE DAILY 05/20/16   Elayne Snare, MD  Blood Glucose Monitoring Suppl (CONTOUR NEXT MONITOR) w/Device KIT 1 each by Does not apply route in the morning and at bedtime. Use Contour Next meter to check blood sugar twice daily. 04/04/21   Elayne Snare, MD  Calcium Carb-Cholecalciferol (CALTRATE 600+D3 PO) Take 1 tablet by mouth daily. 300 mg Vitamin D3    [provider]  Co-Enzyme Q-10 100 MG CAPS Take 100 mg by mouth daily.    [provider]  cyclobenzaprine (FLEXERIL) 5 MG tablet Take 1 tablet (5 mg total)  by mouth every 8 (eight) hours as needed for muscle spasms. 04/10/21   Tower, Wynelle Fanny,  MD  dextroamphetamine (DEXEDRINE SPANSULE) 15 MG 24 hr capsule Take 15 mg by mouth as needed. 08/21/20   [provider]  diazepam (VALIUM) 10 MG tablet Take 5-10 mg by mouth daily as needed for anxiety.    [provider]  diphenhydrAMINE (BENADRYL) 25 MG tablet Take 50 mg by mouth at bedtime.    [provider]  docusate sodium (COLACE) 100 MG capsule Take 100-300 mg by mouth daily as needed for mild constipation or moderate constipation.    [provider]  empagliflozin (JARDIANCE) 25 MG TABS tablet TAKE 1 TABLET(25 MG) BY MOUTH DAILY 05/31/21   Elayne Snare, MD  Evolocumab [facility] SURECLICK) [id number] MG/ML SOAJ Inject 141m every two weeks 02/22/22   KElayne Snare MD  ezetimibe (ZETIA) 10 MG tablet TAKE 1 TABLET(10 MG) BY MOUTH DAILY 12/27/20   KElayne Snare MD  famotidine (PEPCID) 40 MG tablet Take 40 mg by mouth 2 (two) times daily. 30 min. Before meal and at bedtime    [provider]  fluticasone (FLONASE) 50 MCG/ACT nasal spray Place 1 spray into both nostrils daily. 12/05/20   DEugenia Pancoast FNP  gabapentin (NEURONTIN) 300 MG capsule TAKE 1 CAPSULE BY MOUTH 3 TIMES DAILY 06/04/21   Tower, [place], MD  glucose blood (CONTOUR NEXT TEST) test strip USE AS DIRECTED 04/04/21   KElayne Snare MD  insulin aspart (NOVOLOG FLEXPEN) 100 UNIT/ML FlexPen Use 10 to 15 units when blood sugars are more than 200 04/04/21   KElayne Snare MD  Insulin Pen Needle (B-D UF III MINI PEN NEEDLES) 31G X 5 MM MISC USE FOR INJECTIONS TWICE DAILY 11/27/21   KElayne Snare MD  ketoconazole (NIZORAL) 2 % cream Apply to affected areas one to two times daily as needed. 09/11/21   SBrendolyn Patty MD  KRILL OIL PO Take by mouth.    [provider]  lamoTRIgine (LAMICTAL) 200 MG tablet Take 400 mg by mouth daily.    [provider]  latanoprost (XALATAN) 0.005 % ophthalmic solution Place 1 drop into both eyes 2 (two) times a week.  10/12/10   [provider]  LEVEMIR  FLEXPEN 100 UNIT/ML FlexPen USE 60 UNITS SUBCUTANEOUSLY ONCE EVERY EVENING AS DIRECTED 09/18/21   KElayne Snare MD  LEVOXYL 125 MCG tablet 2 tablets Tuesday- Sunday before breakfast and 3 tablets before breakfast on Monday 02/22/22   KElayne Snare MD  loperamide (IMODIUM A-D) 2 MG tablet Take 2 mg by mouth daily as needed for diarrhea or loose stools.    [provider]  Melatonin 10 MG CAPS Take 10 mg by mouth at bedtime.    [provider]  mesalamine (LIALDA) 1.2 g EC tablet TAKE 2 TABLETS BY MOUTH ONCE DAILY 05/15/21   PIrene Shipper MD  metoprolol succinate (TOPROL-XL) 25 MG 24 hr tablet Take 0.5 tablets (12.5 mg total) by mouth daily. 11/26/21   GMinna Merritts MD  Multiple Vitamin (MULTIVITAMIN) tablet Take 1 tablet by mouth daily. Woman 50 plus    [provider]  nitroGLYCERIN (NITROSTAT) 0.4 MG SL tablet Place 1 tablet (0.4 mg total) under the tongue every 5 (five) minutes as needed for chest pain. 09/03/21   GMinna Merritts MD  olmesartan (BENICAR) 5 MG tablet TAKE 1 TABLET BY MOUTH ONCE DAILY 09/12/21   GMinna Merritts MD  ondansetron (ZOFRAN-ODT) 4 MG disintegrating tablet DISSOLVE 1  TABLET ON THE TONGUE EVERY 6 HOURS AS NEEDED FOR NAUSEA OR VOMIT 02/08/22   Irene Shipper, MD  pantoprazole (PROTONIX) 40 MG tablet TAKE 1 TABLET BY MOUTH TWICE DAILY 01/02/22   Irene Shipper, MD  polyethylene glycol (MIRALAX / GLYCOLAX) 17 g packet Take 17 g by mouth 3 (three) times daily as needed for moderate constipation, mild constipation or severe constipation. In water or beverage    [provider]  Probiotic Product (CULTURELLE PROBIOTICS PO) Take 1 capsule by mouth daily. For woman    [provider]  QUEtiapine (SEROQUEL XR) 400 MG 24 hr tablet Take 800 mg by mouth at bedtime.    [provider]  simethicone (MYLICON) 80 MG chewable tablet Chew 80 mg by mouth 4 (four) times daily.    [provider]  Tavaborole (KERYDIN) 5 % SOLN Apply 1  application topically at bedtime. Apply to affected area of left great toenail 08/29/20   Brendolyn Patty, MD  tirzepatide [facility]) 10 MG/0.5ML Pen Inject 10 mg into the skin once a week. 12/21/21   Elayne Snare, MD  tirzepatide [facility]) 5 MG/0.5ML Pen Inject 5 mg into the skin once a week. 12/21/21   Elayne Snare, MD  valACYclovir (VALTREX) 1000 MG tablet TAKE 1 TABLET BY MOUTH ONCE DAILY, take one tablet po bid x 5 days prn outbreak 10/17/21   Eugenia Pancoast, FNP  VRAYLAR capsule Take 3 mg by mouth at bedtime. 02/28/20   [provider]  zaleplon (SONATA) 10 MG capsule Take 10-20 mg by mouth at bedtime.    [provider]    Family History Family History  Problem Relation Age of Onset   Coronary artery disease Father    Colon cancer Father    Alzheimer's disease Father    Dementia Father    Colon polyps Father    Coronary artery disease Mother    Hypertension Mother    Osteoporosis Mother    Crohn's disease Mother        crohns colitis   Breast cancer Other        great aunts   Coronary artery disease Other        Uncle (also AAA)   Diabetes Other        remote family history   Stroke Cousin    Esophageal cancer Neg Hx    Rectal cancer Neg Hx    Stomach cancer Neg Hx     Social History Social History   Tobacco Use   Smoking status: Former    Packs/day: 0.25    Years: 5.00    Total pack years: 1.25    Types: Cigarettes    Quit date: 01/14/1993    Years since quitting: 29.1   Smokeless tobacco: Never  Vaping Use   Vaping Use: Never used  Substance Use Topics   Alcohol use: No    Alcohol/week: 0.0 standard drinks of alcohol    Comment: Recovered ETOH   Drug use: No     Allergies   Ephedrine, Oxycodone, Aspirin, Erythromycin, Nsaids, Other, and Rosuvastatin   Review of Systems Review of Systems  Constitutional:  Negative for chills and fever.  HENT:  Negative for ear pain and sore throat.   Respiratory:  Positive for cough and wheezing. Negative  for shortness of breath.   Cardiovascular:  Negative for chest pain and palpitations.  Gastrointestinal:  Negative for diarrhea and vomiting.  Skin:  Negative for color change and rash.  All other systems  reviewed and are negative.    Physical Exam Triage Vital Signs ED Triage Vitals  Enc Vitals Group     BP      Pulse      Resp      Temp      Temp src      SpO2      Weight      Height      Head Circumference      Peak Flow      Pain Score      Pain Loc      Pain Edu?      Excl. in [place]?    No data found.  Updated Vital Signs BP 113/72   Pulse (!) 105   Temp 98.3 F (36.8 C)   Resp 18   LMP 08/23/2006 (Approximate) Comment: tubal ligation  SpO2 95%   Visual Acuity Right Eye Distance:   Left Eye Distance:   Bilateral Distance:    Right Eye Near:   Left Eye Near:    Bilateral Near:     Physical Exam Vitals and nursing note reviewed.  Constitutional:      General: She is not in acute distress.    Appearance: She is well-developed. She is not ill-appearing.  HENT:     Right Ear: Tympanic membrane normal.     Left Ear: Tympanic membrane normal.     Nose: Nose normal.     Mouth/Throat:     Mouth: Mucous membranes are moist.     Pharynx: Oropharynx is clear.  Cardiovascular:     Rate and Rhythm: Normal rate and regular rhythm.     Heart sounds: Normal heart sounds.  Pulmonary:     Effort: Pulmonary effort is normal. No respiratory distress.     Breath sounds: Wheezing and rhonchi present.     Comments: Rhonchi and wheezing bilateral, R>L.  Musculoskeletal:     Cervical back: Neck supple.  Skin:    General: Skin is warm and dry.  Neurological:     Mental Status: She is alert.  Psychiatric:        Mood and Affect: Mood normal.        Behavior: Behavior normal.      UC Treatments / Results  Labs (all labs ordered are listed, but only abnormal results are displayed) Labs Reviewed - No data to display  EKG   Radiology DG Chest 2 View  Result  Date: 02/22/2022 CLINICAL DATA:  Urgent Care with complaints of wheezing x1 days. Reports a dry cough. Denies any known fevers/ SHOB. Negative covid test at home. Hx of asthma, has had to started using her albuterol inhaler every 4 hours today. EXAM: CHEST - 2 VIEW COMPARISON:  02/01/2021 FINDINGS: Lungs are clear. Heart size and mediastinal contours are within normal limits. No effusion. Mild thoracic dextroscoliosis with multilevel endplate spurring. Cholecystectomy clips. IMPRESSION: No acute cardiopulmonary disease. Electronically Signed   By: Lucrezia Europe M.D.   On: 02/22/2022 16:51    Procedures Procedures (including critical care time)  Medications Ordered in UC Medications - No data to display  Initial Impression / Assessment and Plan / UC Course  I have reviewed the triage vital signs and the nursing notes.  Pertinent labs & imaging results that were available during my care of the patient were reviewed by me and considered in my medical decision making (see chart for details).   Cough, acute bronchitis, asthma exacerbation.  CXR negative.  Patient declines COVID test  as she had negative test at home.  Treating today with prednisone and Zithromax.  Instructed patient to continue using albuterol inhaler as directed.  Education provided on bronchitis and asthma.  Discussed with patient that she should monitor her blood sugar closely while taking the prednisone.  Instructed patient to follow-up with her PCP on Monday.  She agrees to plan of care.   Final Clinical Impressions(s) / UC Diagnoses   Final diagnoses:  Acute cough  Acute bronchitis, unspecified organism  Asthma with acute exacerbation, unspecified asthma severity, unspecified whether persistent     Discharge Instructions      Take the prednisone and Zithromax as directed.  Continue to use your albuterol inhaler as directed.    Follow-up with your primary care provider on Monday.     ED Prescriptions     Medication  Sig Dispense Auth. Provider   azithromycin (ZITHROMAX) 250 MG tablet Take 1 tablet (250 mg total) by mouth daily. Take first 2 tablets together, then 1 every day until finished. 6 tablet Sharion Balloon, NP   predniSONE (DELTASONE) 10 MG tablet Take 4 tablets (40 mg total) by mouth daily for 5 days. 20 tablet Sharion Balloon, NP      PDMP not reviewed this encounter.   Sharion Balloon, NP 02/22/22 1713    Sharion Balloon, NP 02/22/22 [phone]

## 2022-02-22 NOTE — Telephone Encounter (Signed)
Needs to be seen by first available, if none avail then UC for eval Thanks

## 2022-02-22 NOTE — Telephone Encounter (Signed)
Unable to reach pt or anyone on pts DPR., left v/m requesting pt to cb and sending my chart note to pt also.

## 2022-02-27 ENCOUNTER — Other Ambulatory Visit: Payer: Self-pay | Admitting: Internal Medicine

## 2022-03-05 ENCOUNTER — Ambulatory Visit: Payer: BC Managed Care – PPO | Admitting: Family Medicine

## 2022-03-05 ENCOUNTER — Encounter: Payer: Self-pay | Admitting: Family Medicine

## 2022-03-05 VITALS — BP 129/80 | HR 78 | Temp 98.0°F | Ht 66.0 in | Wt 195.1 lb

## 2022-03-05 DIAGNOSIS — J209 Acute bronchitis, unspecified: Secondary | ICD-10-CM | POA: Diagnosis not present

## 2022-03-05 NOTE — Progress Notes (Signed)
Subjective:    Patient ID: Lindsay Fry, female    DOB: 23-Jun-1960, 62 y.o.   MRN: [id number]  HPI Pt presents for f/u of cough Teryl Lucy  Wt Readings from Last 3 Encounters:  03/05/22 195 lb 2 oz (88.5 kg)  12/21/21 207 lb (93.9 kg)  10/29/21 206 lb 8 oz (93.7 kg)   31.49 kg/m  Vitals:   03/05/22 1433  BP: (!) 150/92  Pulse: 78  Temp: 98 F (36.7 C)  SpO2: 97%     Seen in UC on 2/9 Dx with acute bronchitis and asthma exac Cxr neg  Neg covid test at home Tx with prednisone and azithromycin  Inhaler prn   Overall much better than she was  Junky cough  Prod at times Sinus congestion / occ ha but no pain   Tired   Ears are ok  Throat is ok  Balance is off past 2 days   Needs inhaler less Sometimes needs before bed or during sleep     Otc Mucinex DM  Flonase   Takes tylenol arthritis twice daily  Took aleve for muscle aches (short term only)   Has to have a root canal/ needs to get better first   Had good news, husbands pay from disability  came through (he has parkinson's)   Patient Active Problem List   Diagnosis Date Noted   Stress reaction 10/29/2021   Abscess of left groin 10/02/2021   Callus between toes 02/01/2021   Unstable angina ([place]) 04/06/2020   Cerebellar ataxia in diseases classified elsewhere ([place]) 03/28/2020   Elevated transaminase level 03/27/2020   Current use of proton pump inhibitor 03/19/2020   Hypokalemia 03/07/2020   Aortic atherosclerosis ([place]) 03/07/2020   Double vision 12/14/2019   MCI (mild cognitive impairment) 08/05/2019   Obesity (BMI 30-39.9) 04/23/2019   Dizziness 12/16/2018   Poor balance 12/16/2018   Fatigue 12/16/2018   Migraine 11/12/2018   Uncontrolled type 2 diabetes mellitus with hyperglycemia, with long-term current use of insulin ([place]) 02/19/2018   Pleural thickening 11/05/2017   Mouth ulcers 11/05/2017   Cervical radiculopathy due to degenerative joint disease of spine 10/28/2017   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   Supraventricular tachycardia 04/22/2017   Osteopenia 09/25/2016   Estrogen deficiency 05/30/2016   Need for hepatitis C screening test 03/19/2016   Carcinoma of overlapping sites of left breast in female, estrogen receptor positive ([place]) 08/21/2015   Chronic constipation 01/18/2015   Acute bronchitis 11/15/2014   NSTEMI (non-ST elevated myocardial infarction) ([place]) 09/06/2014   Coronary artery disease involving native coronary artery of native heart with angina pectoris ([place]) 09/06/2014   DM (diabetes mellitus) ([place]) 09/05/2014   History of breast cancer 06/02/2014   Sinus tachycardia 04/27/2014   ADD (attention deficit disorder) 11/19/2012   Shortness of breath 09/02/2012   Low back pain 08/05/2012   Hypothyroid 05/18/2012   Chronic sinusitis 01/24/2012   Vertigo, benign positional 01/24/2012   Other screening mammogram 12/05/2010   Routine general medical examination at a health care facility 10/14/2010   POSTMENOPAUSAL STATUS 06/22/2008   Vitamin D deficiency 03/18/2008   BENIGN NEOPLASM [place] SITE DIGESTIVE SYSTEM 01/09/2007   HIATAL HERNIA 01/09/2007   COLONIC POLYPS, ADENOMATOUS, HX OF 01/09/2007   Hyperlipidemia associated with type 2 diabetes mellitus ([place]) 01/07/2007   Bipolar I disorder, most recent episode depressed ([place]) 01/07/2007   Essential hypertension 01/07/2007   Allergic rhinitis 01/07/2007   ASTHMA 01/07/2007   GERD 01/07/2007   Ulcerative colitis ([place]) 01/07/2007  ACNE ROSACEA 01/07/2007   MIGRAINES, HX OF 01/07/2007   Fibromyalgia 10/30/2006   INSOMNIA 10/30/2006   Past Medical History:  Diagnosis Date   ADD (attention deficit disorder) 11/19/2012   Allergic rhinitis, cause unspecified    Anxiety state, unspecified    Arthritis    Asymptomatic postmenopausal status (age-related) (natural)    Atypical hyperplasia of left breast 2000   Atypical mole 03/16/2018   R paraspinal mid upper back, excision   Atypical mole  02/17/2018   R spinal upper back, moderate   Atypical mole 08/11/2017   L sternum, excision   Atypical mole 05/27/2017   L [place] neck, moderate   Benign neoplasm of other and unspecified site of the digestive system    Bipolar disorder ([place])    Breast cancer ([place])    Left Breast 48m invasive CA left uiq, dcis left uoq and a lot ADH, ALH in both breasts.   CAD (coronary artery disease)    a. 08/2014 NSTEMI/Cath: LM nl, LAD 50p, D1/D2 min irregs, LCX nl, OM1 40, LPDA nl, RI 99ost (2.014mvessel->med Rx), RCA nl, AM 60, nl EF; b. 11/2016 MV: EF 74%, no ischemia (performed 2/2 dyspnea & new inf TWI).   Carcinoma of left breast ([place]5/19/2016   Cataract    Chronic diastolic CHF (congestive heart failure) ([place]   a. 08/2014 Echo: EF 60-65%, Gr 1 DD, nl LA.   Clotting disorder ([facility]   on blood thiner, Plavix   Cyst    on Achilles tendon   Depression    Diabetes mellitus ([facility]   frank   Dysuria    Edema    Family history of malignant neoplasm of gastrointestinal tract    Fibromyalgia    GERD (gastroesophageal reflux disease)    Glaucoma    pre glaucoma, 05/14/2018 pt. denies glaucoma   Headache    migraines   Hiatal hernia    History of alcoholism ([facility]   History of migraines    History of ovarian cyst    Hx of adenomatous colonic polyps    Insomnia, unspecified    Myocardial infarction (HC[facility] West8-21-16   OCD (obsessive compulsive disorder)    Osteopenia 09/25/2016   Femoral neck T -1.1  9/18   Other screening mammogram    Pure hypercholesterolemia    Rosacea    Subacute confusional state 11/19/2012   Tubular adenoma of colon 01/10/11   Ulcerative colitis, unspecified    Unspecified asthma(493.90)    Unspecified essential hypertension    Unspecified hypothyroidism    Unspecified vitamin D deficiency    Vertigo    Past Surgical History:  Procedure Laterality Date   APPENDECTOMY     AXILLARY SENTINEL NODE BIOPSY Left 05/23/2014   Procedure: AXILLARY SENTINEL NODE BIOPSY;   Surgeon: SeChristene LyeMD;  Location: [facility];  Service: General;  Laterality: Left;   BREAST BIOPSY  08/1998   on tamoxifen, atypical hyperplasia   BREAST BIOPSY Left 04/2014   BREAST SURGERY Left 08/1998   lumpectomy/ Dr CrSharlet Salina BREAST SURGERY Bilateral 05/23/2014   Mastectomy   BUNIONECTOMY Right    CARDIAC CATHETERIZATION N/A 09/05/2014   Procedure: Left Heart Cath and Coronary Angiography;  Surgeon: MuWellington HampshireMD;  [facility];  Service: Cardiovascular;  Laterality: N/A;   CARDIAC CATHETERIZATION  09/05/2014   CATARACT EXTRACTION W/ INTRAOCULAR LENS IMPLANT Bilateral    CESAREAN SECTION  1996/1997   placenta previa, gest DM, pre-eclampsia  CHOLECYSTECTOMY  1997   adhesions also   COLONOSCOPY  01/2001   Ulcerative colitis   COLONOSCOPY  04/2004   UC, polyp   COLONOSCOPY  12/2006   UC, no polyps   CORONARY STENT INTERVENTION N/A 04/06/2020   Procedure: CORONARY STENT INTERVENTION;  Surgeon: Nelva Bush, MD;  Location: [place] [facility];  Service: Cardiovascular;  Laterality: N/A;   DEXA  04/1999 and 2010   normal   ESOPHAGOGASTRODUODENOSCOPY  09/2001   polyp   exercise stress test  11/2003   negative   FEMUR FRACTURE SURGERY Left    LEFT HEART CATH AND CORONARY ANGIOGRAPHY N/A 04/06/2020   Procedure: LEFT HEART CATH AND CORONARY ANGIOGRAPHY;  Surgeon: Minna Merritts, MD;  Location: [place] [facility];  Service: Cardiovascular;  Laterality: N/A;   MASTECTOMY Bilateral    MECKEL DIVERTICULUM EXCISION  1999   NASAL SINUS SURGERY  05/1997   nuclear stress test  06/2007   negative   precancerous mole removed     RIGHT AND LEFT HEART CATH     with 1 stent placed in March 2022   SIMPLE MASTECTOMY WITH AXILLARY SENTINEL NODE BIOPSY Bilateral 05/23/2014   Procedure: Bilateral simple mastectomy, left sentinel node biopsy ;  Surgeon: Christene Lye, MD;  Location: [facility];  Service: General;  Laterality: Bilateral;    Sleep study  11/2007   no apnea, but did snore (done by [facility])   [place] EXTRACTION  1980's   Social History   Tobacco Use   Smoking status: Former    Packs/day: 0.25    Years: 5.00    Total pack years: 1.25    Types: Cigarettes    Quit date: 01/14/1993    Years since quitting: 29.1   Smokeless tobacco: Never  Vaping Use   Vaping Use: Never used  Substance Use Topics   Alcohol use: No    Alcohol/week: 0.0 standard drinks of alcohol    Comment: Recovered ETOH   Drug use: No   Family History  Problem Relation Age of Onset   Coronary artery disease Father    Colon cancer Father    Alzheimer's disease Father    Dementia Father    Colon polyps Father    Coronary artery disease Mother    Hypertension Mother    Osteoporosis Mother    Crohn's disease Mother        crohns colitis   Breast cancer Other        great aunts   Coronary artery disease Other        Uncle (also AAA)   Diabetes Other        remote family history   Stroke Cousin    Esophageal cancer Neg Hx    Rectal cancer Neg Hx    Stomach cancer Neg Hx    Allergies  Allergen Reactions   Ephedrine Other (See Comments)    Pt becomes hyper   Oxycodone Other (See Comments)    Panic Attack   Aspirin Other (See Comments)    REACTION: aggrivates colitis Tolerates 81 mg but unable to take doses >81 mg   Erythromycin Other (See Comments)    REACTION: GI upset   Nsaids     REACTION: aggrivate colitis   Other Other (See Comments)    Unable to take due to history of ulcerative colitis   Rosuvastatin Other (See Comments)    REACTION: increased lfts  Current Outpatient Medications on File Prior to Visit  Medication Sig Dispense Refill   acetaminophen (TYLENOL) 650 MG CR tablet Take 1,300 mg by mouth 2 (two) times daily.     albuterol (VENTOLIN HFA) 108 (90 Base) MCG/ACT inhaler INHALE 1 PUFF INTO THE LUNGS EVERY 4 HOURS AS NEEDED FOR WHEEZING     amantadine  (SYMMETREL) 100 MG capsule Take 100 mg by mouth 2 (two) times daily.     Ascorbic Acid (VITAMIN C PO) Take 1,000 mg by mouth daily.     aspirin 81 MG chewable tablet Chew 1 tablet (81 mg total) by mouth daily. 90 tablet 0   atorvastatin (LIPITOR) 80 MG tablet TAKE 1 TABLET BY MOUTH ONCE EVERY EVENING 90 tablet 1   B Complex Vitamins (VITAMIN B COMPLEX PO) Take 1 tablet by mouth every other day.     BAYER MICROLET LANCETS lancets USE AS DIRECTED TO CHECK BLOOD SUGAR TWICE DAILY 200 each 2   Blood Glucose Monitoring Suppl (CONTOUR NEXT MONITOR) w/Device KIT 1 each by Does not apply route in the morning and at bedtime. Use Contour Next meter to check blood sugar twice daily. 1 kit 0   Calcium Carb-Cholecalciferol (CALTRATE 600+D3 PO) Take 1 tablet by mouth daily. 300 mg Vitamin D3     Co-Enzyme Q-10 100 MG CAPS Take 100 mg by mouth daily.     cyclobenzaprine (FLEXERIL) 5 MG tablet Take 1 tablet (5 mg total) by mouth every 8 (eight) hours as needed for muscle spasms. 20 tablet 0   dextroamphetamine (DEXEDRINE SPANSULE) 15 MG 24 hr capsule Take 15 mg by mouth as needed.     diazepam (VALIUM) 10 MG tablet Take 5-10 mg by mouth daily as needed for anxiety.     diphenhydrAMINE (BENADRYL) 25 MG tablet Take 50 mg by mouth at bedtime.     docusate sodium (COLACE) 100 MG capsule Take 100-300 mg by mouth daily as needed for mild constipation or moderate constipation.     empagliflozin (JARDIANCE) 25 MG TABS tablet TAKE 1 TABLET(25 MG) BY MOUTH DAILY 90 tablet 3   Evolocumab (REPATHA SURECLICK) [id number] MG/ML SOAJ Inject 178m every two weeks 2 mL 2   ezetimibe (ZETIA) 10 MG tablet TAKE 1 TABLET(10 MG) BY MOUTH DAILY 90 tablet 3   famotidine (PEPCID) 40 MG tablet Take 40 mg by mouth 2 (two) times daily. 30 min. Before meal and at bedtime     fluticasone (FLONASE) 50 MCG/ACT nasal spray Place 1 spray into both nostrils daily. 16 g 6   gabapentin (NEURONTIN) 300 MG capsule TAKE 1 CAPSULE BY MOUTH 3 TIMES DAILY 270  capsule 3   glucose blood (CONTOUR NEXT TEST) test strip USE AS DIRECTED 100 strip 2   insulin aspart (NOVOLOG FLEXPEN) 100 UNIT/ML FlexPen Use 10 to 15 units when blood sugars are more than 200 15 mL 1   Insulin Pen Needle (B-D UF III MINI PEN NEEDLES) 31G X 5 MM MISC USE FOR INJECTIONS TWICE DAILY 150 each 3   ketoconazole (NIZORAL) 2 % cream Apply to affected areas one to two times daily as needed. 30 g 11   KRILL OIL PO Take by mouth.     lamoTRIgine (LAMICTAL) 200 MG tablet Take 400 mg by mouth daily.     latanoprost (XALATAN) 0.005 % ophthalmic solution Place 1 drop into both eyes 2 (two) times a week.      LEVEMIR FLEXPEN 100 UNIT/ML FlexPen USE 60 UNITS SUBCUTANEOUSLY ONCE EVERY EVENING AS  DIRECTED 15 mL 5   LEVOXYL 125 MCG tablet 2 tablets Tuesday- Sunday before breakfast and 3 tablets before breakfast on Monday 100 tablet 1   loperamide (IMODIUM A-D) 2 MG tablet Take 2 mg by mouth daily as needed for diarrhea or loose stools.     Melatonin 10 MG CAPS Take 10 mg by mouth at bedtime.     mesalamine (LIALDA) 1.2 g EC tablet TAKE 2 TABLETS BY MOUTH ONCE DAILY 180 tablet 1   metoprolol succinate (TOPROL-XL) 25 MG 24 hr tablet Take 0.5 tablets (12.5 mg total) by mouth daily. 45 tablet 1   Multiple Vitamin (MULTIVITAMIN) tablet Take 1 tablet by mouth daily. Woman 50 plus     nitroGLYCERIN (NITROSTAT) 0.4 MG SL tablet Place 1 tablet (0.4 mg total) under the tongue every 5 (five) minutes as needed for chest pain. 25 tablet 3   olmesartan (BENICAR) 5 MG tablet TAKE 1 TABLET BY MOUTH ONCE DAILY 90 tablet 3   ondansetron (ZOFRAN-ODT) 4 MG disintegrating tablet DISSOLVE 1 TABLET ON THE TONGUE EVERY 6 HOURS AS NEEDED FOR NAUSEA OR VOMIT 90 tablet 1   pantoprazole (PROTONIX) 40 MG tablet TAKE 1 TABLET BY MOUTH TWICE DAILY 180 tablet 1   polyethylene glycol (MIRALAX / GLYCOLAX) 17 g packet Take 17 g by mouth 3 (three) times daily as needed for moderate constipation, mild constipation or severe  constipation. In water or beverage     Probiotic Product (CULTURELLE PROBIOTICS PO) Take 1 capsule by mouth daily. For woman     QUEtiapine (SEROQUEL XR) 400 MG 24 hr tablet Take 800 mg by mouth at bedtime.     simethicone (MYLICON) 80 MG chewable tablet Chew 80 mg by mouth 4 (four) times daily.     Tavaborole (KERYDIN) 5 % SOLN Apply 1 application topically at bedtime. Apply to affected area of left great toenail 10 mL 6   tirzepatide (MOUNJARO) 10 MG/0.5ML Pen Inject 10 mg into the skin once a week. 2 mL 1   tirzepatide (MOUNJARO) 5 MG/0.5ML Pen Inject 5 mg into the skin once a week. 2 mL 0   valACYclovir (VALTREX) 1000 MG tablet TAKE 1 TABLET BY MOUTH ONCE DAILY, take one tablet po bid x 5 days prn outbreak 90 tablet 3   VRAYLAR capsule Take 3 mg by mouth at bedtime.     zaleplon (SONATA) 10 MG capsule Take 10-20 mg by mouth at bedtime.     No current facility-administered medications on file prior to visit.    Review of Systems  Constitutional:  Positive for appetite change and fatigue. Negative for fever.  HENT:  Positive for congestion, postnasal drip and rhinorrhea. Negative for ear pain, sinus pressure, sneezing and sore throat.   Eyes:  Negative for pain and discharge.  Respiratory:  Positive for cough and wheezing. Negative for shortness of breath and stridor.   Cardiovascular:  Negative for chest pain.  Gastrointestinal:  Negative for diarrhea, nausea and vomiting.  Genitourinary:  Negative for frequency, hematuria and urgency.  Musculoskeletal:  Negative for arthralgias and myalgias.  Skin:  Negative for rash.  Neurological:  Positive for headaches. Negative for dizziness, weakness and light-headedness.  Psychiatric/Behavioral:  Negative for confusion and dysphoric mood.        Objective:   Physical Exam Constitutional:      General: She is not in acute distress.    Appearance: Normal appearance. She is well-developed. She is obese. She is not ill-appearing,  toxic-appearing or diaphoretic.  HENT:  Head: Normocephalic and atraumatic.     Comments: No sinus tenderness    Right Ear: Tympanic membrane, ear canal and external ear normal.     Left Ear: Tympanic membrane, ear canal and external ear normal.     Nose: Congestion and rhinorrhea present.     Mouth/Throat:     Mouth: Mucous membranes are moist.     Pharynx: Oropharynx is clear. No oropharyngeal exudate or posterior oropharyngeal erythema.     Comments: Clear pnd  Eyes:     General:        Right eye: No discharge.        Left eye: No discharge.     Conjunctiva/sclera: Conjunctivae normal.     Pupils: Pupils are equal, round, and reactive to light.  Cardiovascular:     Rate and Rhythm: Normal rate.     Heart sounds: Normal heart sounds.  Pulmonary:     Effort: Pulmonary effort is normal. No respiratory distress.     Breath sounds: Normal breath sounds. No stridor. No wheezing, rhonchi or rales.     Comments: Few scant rhonchi at bases  No wheeze unless forced exp Upper airway sounds  Chest:     Chest wall: No tenderness.  Musculoskeletal:     Cervical back: Normal range of motion and neck supple.  Lymphadenopathy:     Cervical: No cervical adenopathy.  Skin:    General: Skin is warm and dry.     Capillary Refill: Capillary refill takes less than 2 seconds.     Findings: No rash.  Neurological:     Mental Status: She is alert.     Cranial Nerves: No cranial nerve deficit.  Psychiatric:        Mood and Affect: Mood normal.           Assessment & Plan:   Problem List Items Addressed This Visit       Respiratory   Acute bronchitis - Primary    Reassuring exam with some improvement  Neg covid tests at home Rev UC notes and cxr report  Less wheezing Enc continued symptom control / albuterol mdi / mucinex DM with fluids and rest  ER parameters noted Update if not starting to improve in a week or if worsening

## 2022-03-05 NOTE — Assessment & Plan Note (Signed)
Reassuring exam with some improvement  Neg covid tests at home Rev UC notes and cxr report  Less wheezing Enc continued symptom control / albuterol mdi / mucinex DM with fluids and rest  ER parameters noted Update if not starting to improve in a week or if worsening

## 2022-03-05 NOTE — Patient Instructions (Signed)
Your lungs sound ok -minimal wheezing and I do not hear any pneumonia  Treat symptoms and drink fluids   Continue mucinex DM if helpful   If symptoms do not continue to improve or if you worsen please call/ follow up

## 2022-03-14 ENCOUNTER — Encounter: Payer: Self-pay | Admitting: Cardiovascular Disease

## 2022-03-14 ENCOUNTER — Other Ambulatory Visit: Payer: Self-pay

## 2022-03-14 ENCOUNTER — Encounter: Payer: Self-pay | Admitting: Endocrinology

## 2022-03-14 DIAGNOSIS — E1169 Type 2 diabetes mellitus with other specified complication: Secondary | ICD-10-CM

## 2022-03-14 MED ORDER — LEVEMIR FLEXPEN 100 UNIT/ML ~~LOC~~ SOPN: PEN_INJECTOR | SUBCUTANEOUS | 5 refills | Status: DC

## 2022-03-19 ENCOUNTER — Encounter: Payer: Self-pay | Admitting: Endocrinology

## 2022-03-19 DIAGNOSIS — E1165 Type 2 diabetes mellitus with hyperglycemia: Secondary | ICD-10-CM

## 2022-03-19 MED ORDER — TIRZEPATIDE 10 MG/0.5ML ~~LOC~~ SOAJ: 10.0000 mg | SUBCUTANEOUS | 5 refills | Status: DC

## 2022-04-01 ENCOUNTER — Encounter: Payer: Self-pay | Admitting: Endocrinology

## 2022-04-01 ENCOUNTER — Encounter: Payer: Self-pay | Admitting: Family Medicine

## 2022-04-02 MED ORDER — GABAPENTIN 300 MG PO CAPS: ORAL_CAPSULE | ORAL | 1 refills | Status: DC

## 2022-04-02 NOTE — Telephone Encounter (Signed)
Patient picked up samples

## 2022-04-08 ENCOUNTER — Encounter: Payer: Self-pay | Admitting: Endocrinology

## 2022-04-08 DIAGNOSIS — E890 Postprocedural hypothyroidism: Secondary | ICD-10-CM

## 2022-04-08 MED ORDER — LEVOXYL 125 MCG PO TABS: ORAL_TABLET | ORAL | 1 refills | Status: DC

## 2022-04-18 ENCOUNTER — Other Ambulatory Visit: Payer: Self-pay

## 2022-04-18 ENCOUNTER — Telehealth: Payer: Self-pay

## 2022-04-18 DIAGNOSIS — E1165 Type 2 diabetes mellitus with hyperglycemia: Secondary | ICD-10-CM

## 2022-04-18 DIAGNOSIS — E890 Postprocedural hypothyroidism: Secondary | ICD-10-CM

## 2022-04-18 DIAGNOSIS — E559 Vitamin D deficiency, unspecified: Secondary | ICD-10-CM

## 2022-04-18 DIAGNOSIS — E782 Mixed hyperlipidemia: Secondary | ICD-10-CM

## 2022-04-18 NOTE — Addendum Note (Signed)
Addended by: Nils Flack I on: 04/18/2022 08:22 AM   Modules accepted: Orders

## 2022-04-18 NOTE — Telephone Encounter (Signed)
Labs released and pt advised via Mychart message.

## 2022-04-18 NOTE — Telephone Encounter (Signed)
Patient needs to have labs released to [place] at [facility] from 12/23/22. Can you have lab release at let patient know.

## 2022-04-19 ENCOUNTER — Other Ambulatory Visit: Payer: Self-pay | Admitting: Endocrinology

## 2022-04-19 DIAGNOSIS — E1165 Type 2 diabetes mellitus with hyperglycemia: Secondary | ICD-10-CM

## 2022-04-19 DIAGNOSIS — E559 Vitamin D deficiency, unspecified: Secondary | ICD-10-CM

## 2022-04-19 DIAGNOSIS — E890 Postprocedural hypothyroidism: Secondary | ICD-10-CM

## 2022-04-19 DIAGNOSIS — E782 Mixed hyperlipidemia: Secondary | ICD-10-CM

## 2022-04-19 DIAGNOSIS — E7800 Pure hypercholesterolemia, unspecified: Secondary | ICD-10-CM

## 2022-04-21 ENCOUNTER — Telehealth: Payer: Self-pay | Admitting: Family Medicine

## 2022-04-21 DIAGNOSIS — E1159 Type 2 diabetes mellitus with other circulatory complications: Secondary | ICD-10-CM

## 2022-04-21 DIAGNOSIS — E559 Vitamin D deficiency, unspecified: Secondary | ICD-10-CM

## 2022-04-21 DIAGNOSIS — I10 Essential (primary) hypertension: Secondary | ICD-10-CM

## 2022-04-21 DIAGNOSIS — E890 Postprocedural hypothyroidism: Secondary | ICD-10-CM

## 2022-04-21 DIAGNOSIS — Z79899 Other long term (current) drug therapy: Secondary | ICD-10-CM

## 2022-04-21 DIAGNOSIS — E1169 Type 2 diabetes mellitus with other specified complication: Secondary | ICD-10-CM

## 2022-04-21 NOTE — Telephone Encounter (Signed)
-----   Message from Alvina Chou sent at 04/08/2022  3:23 PM EDT ----- Regarding: Lab orders for Monday, 4.8.24 Patient is scheduled for CPX labs, please order future labs, Thanks , Camelia Eng

## 2022-04-22 ENCOUNTER — Other Ambulatory Visit: Payer: BC Managed Care – PPO

## 2022-04-22 ENCOUNTER — Other Ambulatory Visit (INDEPENDENT_AMBULATORY_CARE_PROVIDER_SITE_OTHER): Payer: Self-pay

## 2022-04-22 DIAGNOSIS — E1165 Type 2 diabetes mellitus with hyperglycemia: Secondary | ICD-10-CM

## 2022-04-22 DIAGNOSIS — E890 Postprocedural hypothyroidism: Secondary | ICD-10-CM

## 2022-04-22 DIAGNOSIS — Z79899 Other long term (current) drug therapy: Secondary | ICD-10-CM

## 2022-04-22 DIAGNOSIS — Z794 Long term (current) use of insulin: Secondary | ICD-10-CM

## 2022-04-22 DIAGNOSIS — E782 Mixed hyperlipidemia: Secondary | ICD-10-CM

## 2022-04-22 DIAGNOSIS — E7800 Pure hypercholesterolemia, unspecified: Secondary | ICD-10-CM

## 2022-04-22 DIAGNOSIS — I10 Essential (primary) hypertension: Secondary | ICD-10-CM

## 2022-04-22 DIAGNOSIS — E559 Vitamin D deficiency, unspecified: Secondary | ICD-10-CM

## 2022-04-22 DIAGNOSIS — E785 Hyperlipidemia, unspecified: Secondary | ICD-10-CM

## 2022-04-22 DIAGNOSIS — E1169 Type 2 diabetes mellitus with other specified complication: Secondary | ICD-10-CM

## 2022-04-22 LAB — LIPID PANEL
Cholesterol: 90 mg/dL (ref 0–200)
HDL: 54.7 mg/dL (ref >39.00)
LDL Cholesterol: 19 mg/dL (ref 0–99)
NonHDL: 35.77
Total CHOL/HDL Ratio: 2
Triglycerides: 83 mg/dL (ref 0.0–149.0)
VLDL: 16.6 mg/dL (ref 0.0–40.0)

## 2022-04-22 LAB — CBC WITH DIFFERENTIAL/PLATELET
Basophils Absolute: 0.1 10*3/uL (ref 0.0–0.1)
Basophils Relative: 1 % (ref 0.0–3.0)
Eosinophils Absolute: 0.5 10*3/uL (ref 0.0–0.7)
Eosinophils Relative: 8.1 % — ABNORMAL HIGH (ref 0.0–5.0)
HCT: 44.4 % (ref 36.0–46.0)
Hemoglobin: 14.9 g/dL (ref 12.0–15.0)
Lymphocytes Relative: 29.9 % (ref 12.0–46.0)
Lymphs Abs: 1.9 10*3/uL (ref 0.7–4.0)
MCHC: 33.6 g/dL (ref 30.0–36.0)
MCV: 94.7 fl (ref 78.0–100.0)
Monocytes Absolute: 0.7 10*3/uL (ref 0.1–1.0)
Monocytes Relative: 10.4 % (ref 3.0–12.0)
Neutro Abs: 3.3 10*3/uL (ref 1.4–7.7)
Neutrophils Relative %: 50.6 % (ref 43.0–77.0)
Platelets: 283 10*3/uL (ref 150.0–400.0)
RBC: 4.69 Mil/uL (ref 3.87–5.11)
RDW: 14.5 % (ref 11.5–15.5)
WBC: 6.5 10*3/uL (ref 4.0–10.5)

## 2022-04-22 LAB — COMPREHENSIVE METABOLIC PANEL
ALT: 42 U/L — ABNORMAL HIGH (ref 0–35)
AST: 43 U/L — ABNORMAL HIGH (ref 0–37)
Albumin: 4.5 g/dL (ref 3.5–5.2)
Alkaline Phosphatase: 71 U/L (ref 39–117)
BUN: 18 mg/dL (ref 6–23)
CO2: 27 mEq/L (ref 19–32)
Calcium: 9.6 mg/dL (ref 8.4–10.5)
Chloride: 107 mEq/L (ref 96–112)
Creatinine, Ser: 0.82 mg/dL (ref 0.40–1.20)
GFR: 75.95 mL/min (ref >60.00)
Glucose, Bld: 107 mg/dL — ABNORMAL HIGH (ref 70–99)
Potassium: 4.6 mEq/L (ref 3.5–5.1)
Sodium: 142 mEq/L (ref 135–145)
Total Bilirubin: 0.4 mg/dL (ref 0.2–1.2)
Total Protein: 6.2 g/dL (ref 6.0–8.3)

## 2022-04-22 LAB — TSH: TSH: 2.54 u[IU]/mL (ref 0.35–5.50)

## 2022-04-22 LAB — VITAMIN D 25 HYDROXY (VIT D DEFICIENCY, FRACTURES): VITD: 38.13 ng/mL (ref 30.00–100.00)

## 2022-04-22 LAB — HEMOGLOBIN A1C: Hgb A1c MFr Bld: 5.2 % (ref 4.6–6.5)

## 2022-04-22 LAB — VITAMIN B12: Vitamin B-12: 537 pg/mL (ref 211–911)

## 2022-04-23 ENCOUNTER — Ambulatory Visit (INDEPENDENT_AMBULATORY_CARE_PROVIDER_SITE_OTHER): Payer: Self-pay | Admitting: Endocrinology

## 2022-04-23 ENCOUNTER — Encounter: Payer: Self-pay | Admitting: Endocrinology

## 2022-04-23 VITALS — BP 130/100 | HR 73 | Ht 66.0 in | Wt 196.8 lb

## 2022-04-23 DIAGNOSIS — E669 Obesity, unspecified: Secondary | ICD-10-CM

## 2022-04-23 DIAGNOSIS — E1169 Type 2 diabetes mellitus with other specified complication: Secondary | ICD-10-CM

## 2022-04-23 DIAGNOSIS — E559 Vitamin D deficiency, unspecified: Secondary | ICD-10-CM

## 2022-04-23 DIAGNOSIS — E890 Postprocedural hypothyroidism: Secondary | ICD-10-CM

## 2022-04-23 DIAGNOSIS — E782 Mixed hyperlipidemia: Secondary | ICD-10-CM

## 2022-04-23 NOTE — Progress Notes (Signed)
Pt provided samples of Jardiance 25 mg.

## 2022-04-23 NOTE — Progress Notes (Signed)
Patient ID: Lindsay Fry, female   DOB: 14-Oct-1960, 62 y.o.   MRN: [id number]   Reason for Appointment:   followup visit  History of Present Illness:  DIABETES:  Prior history: With her high A1c of 6.7 in 03/2014 she had an abnormal glucose tolerance test indicating diabetes and 1 hour glucose of 300 She was started on metformin; however even with 1500 mg of metformin ER  blood sugars were not controlled especially fasting Subsequently with taking Janumet XR 100/1000 daily her blood sugars were much better Because of her weight gain and A1c going up to 7.3 along with occasional readings over 200 she was started on Victoza in 11/16 She has had progressive hyperglycemia previously and A1c was up to 8.6% in 4/17  She was started on Levemir insulin in 08/2015 because of progressive rise in blood sugars and inability to control with 3 other agents   Non-insulin hypoglycemic drugs: Ozempic/Mounjaro 10: See below; metformin ER 2000 mg daily, Jardiance 25 mg daily   INSULIN regimen: Levemir 60 units at bedtime  A1c is only 5.2 compared to 6.1   Current management, blood sugar patterns and problems identified: She has been taking Mounjaro and was on 10 mg, currently not able to get this for about a month Overall compared to Ozempic she has had better control and also has lost 11 pounds since her last visit Some of her blood sugars on her meter download are high but most of these from her husband checking his blood sugar However she thinks her blood sugars are generally not high  As before is not doing any readings after meals especially at night or after lunch  She has however started exercising regularly at the gym Overall doing better with watching her diet and portions when taking Mounjaro   GLUCOSE readings from download of Contour meter   PRE-MEAL Fasting Lunch Dinner Bedtime Overall  Glucose range: 89-150   120   Mean/median:        POST-MEAL PC Breakfast PC Lunch  PC Dinner  Glucose range:  142   Mean/median:       Prior fasting average 138, nonfasting range 87-143  Weight history:  Wt Readings from Last 3 Encounters:  04/23/22 196 lb 12.8 oz (89.3 kg)  03/05/22 195 lb 2 oz (88.5 kg)  12/21/21 207 lb (93.9 kg)   Previous consultation with dietitian: 2016    LABS:  Lab Results  Component Value Date   HGBA1C 5.2 04/22/2022   HGBA1C 6.1 12/04/2021   HGBA1C 6.1 05/29/2021   Lab Results  Component Value Date   MICROALBUR <0.7 10/12/2020   LDLCALC 19 04/22/2022   CREATININE 0.82 04/22/2022   HYPOTHYROIDISM: This was first diagnosed in 1992 after her treatment for Graves' disease with I-131 She has been on relatively large doses of thyroxine supplements She was on 224 mcg since 11/13 and her TSH was normal in 3/14 Subjectively difficult to assess her thyroid since she tends to have fatigue chronically.  In 2014 her dose was reduced to 200 mcg daily and her dose has been fluctuating since then She had required periodic increase in dosage including in 05/2014 when her TSH was 20  She is on 250 g dosage with extra 125 mcg weekly, continues on the brand name LEVOXYL  She feels fairly good overall  She has been regular with the Levoxyl every day before breakfast         TSH as follows  Lab Results  Component Value Date   TSH 2.54 04/22/2022   TSH 0.99 12/04/2021   TSH 0.50 05/29/2021   FREET4 0.84 12/04/2021   FREET4 0.73 02/12/2021   FREET4 1.04 10/12/2020     Allergies as of 04/23/2022       Reactions   Ephedrine Other (See Comments)   Pt becomes hyper   Oxycodone Other (See Comments)   Panic Attack   Aspirin Other (See Comments)   REACTION: aggrivates colitis Tolerates 81 mg but unable to take doses >81 mg   Erythromycin Other (See Comments)   REACTION: GI upset   Nsaids    REACTION: aggrivate colitis   Other Other (See Comments)   Unable to take due to history of ulcerative colitis   Rosuvastatin Other (See  Comments)   REACTION: increased lfts        Medication List        Accurate as of April 23, 2022 11:43 AM. If you have any questions, ask your nurse or doctor.          acetaminophen 650 MG CR tablet Commonly known as: TYLENOL Take 1,300 mg by mouth 2 (two) times daily.   albuterol 108 (90 Base) MCG/ACT inhaler Commonly known as: VENTOLIN HFA INHALE 1 PUFF INTO THE LUNGS EVERY 4 HOURS AS NEEDED FOR WHEEZING   amantadine 100 MG capsule Commonly known as: SYMMETREL Take 100 mg by mouth 2 (two) times daily.   aspirin 81 MG chewable tablet Chew 1 tablet (81 mg total) by mouth daily.   atorvastatin 80 MG tablet Commonly known as: LIPITOR TAKE 1 TABLET BY MOUTH ONCE EVERY EVENING   B-D UF III MINI PEN NEEDLES 31G X 5 MM Misc Generic drug: Insulin Pen Needle USE FOR INJECTIONS TWICE DAILY   Bayer Microlet Lancets lancets USE AS DIRECTED TO CHECK BLOOD SUGAR TWICE DAILY   CALTRATE 600+D3 PO Take 1 tablet by mouth daily. 300 mg Vitamin D3   Co-Enzyme Q-10 100 MG Caps Take 100 mg by mouth daily.   Contour Next Monitor w/Device Kit 1 each by Does not apply route in the morning and at bedtime. Use Contour Next meter to check blood sugar twice daily.   Contour Next Test test strip Generic drug: glucose blood USE AS DIRECTED   CULTURELLE PROBIOTICS PO Take 1 capsule by mouth daily. For woman   cyclobenzaprine 5 MG tablet Commonly known as: FLEXERIL Take 1 tablet (5 mg total) by mouth every 8 (eight) hours as needed for muscle spasms.   dextroamphetamine 15 MG 24 hr capsule Commonly known as: DEXEDRINE SPANSULE Take 15 mg by mouth as needed.   diazepam 10 MG tablet Commonly known as: VALIUM Take 5-10 mg by mouth daily as needed for anxiety.   diphenhydrAMINE 25 MG tablet Commonly known as: BENADRYL Take 50 mg by mouth at bedtime.   docusate sodium 100 MG capsule Commonly known as: COLACE Take 100-300 mg by mouth daily as needed for mild constipation or  moderate constipation.   empagliflozin 25 MG Tabs tablet Commonly known as: Jardiance TAKE 1 TABLET(25 MG) BY MOUTH DAILY   ezetimibe 10 MG tablet Commonly known as: ZETIA TAKE 1 TABLET(10 MG) BY MOUTH DAILY   famotidine 40 MG tablet Commonly known as: PEPCID Take 40 mg by mouth 2 (two) times daily. 30 min. Before meal and at bedtime   fluticasone 50 MCG/ACT nasal spray Commonly known as: FLONASE Place 1 spray into both nostrils daily.   gabapentin 300 MG capsule Commonly known as:  NEURONTIN TAKE 1 CAPSULE BY MOUTH 3 TIMES DAILY   ketoconazole 2 % cream Commonly known as: NIZORAL Apply to affected areas one to two times daily as needed.   KRILL OIL PO Take by mouth.   lamoTRIgine 200 MG tablet Commonly known as: LAMICTAL Take 400 mg by mouth daily.   latanoprost 0.005 % ophthalmic solution Commonly known as: XALATAN Place 1 drop into both eyes 2 (two) times a week.   Levemir FlexPen 100 UNIT/ML FlexPen Generic drug: insulin detemir USE 60 UNITS SUBCUTANEOUSLY ONCE EVERY EVENING AS DIRECTED   Levoxyl 125 MCG tablet Generic drug: levothyroxine 2 tablets Tuesday- Sunday before breakfast and 3 tablets before breakfast on Monday   loperamide 2 MG tablet Commonly known as: IMODIUM A-D Take 2 mg by mouth daily as needed for diarrhea or loose stools.   Melatonin 10 MG Caps Take 10 mg by mouth at bedtime.   mesalamine 1.2 g EC tablet Commonly known as: LIALDA TAKE 2 TABLETS BY MOUTH ONCE DAILY   metoprolol succinate 25 MG 24 hr tablet Commonly known as: TOPROL-XL Take 0.5 tablets (12.5 mg total) by mouth daily.   multivitamin tablet Take 1 tablet by mouth daily. Woman 50 plus   nitroGLYCERIN 0.4 MG SL tablet Commonly known as: NITROSTAT Place 1 tablet (0.4 mg total) under the tongue every 5 (five) minutes as needed for chest pain.   NovoLOG FlexPen 100 UNIT/ML FlexPen Generic drug: insulin aspart Use 10 to 15 units when blood sugars are more than 200    olmesartan 5 MG tablet Commonly known as: BENICAR TAKE 1 TABLET BY MOUTH ONCE DAILY   ondansetron 4 MG disintegrating tablet Commonly known as: ZOFRAN-ODT DISSOLVE 1 TABLET ON THE TONGUE EVERY 6 HOURS AS NEEDED FOR NAUSEA OR VOMIT   pantoprazole 40 MG tablet Commonly known as: PROTONIX TAKE 1 TABLET BY MOUTH TWICE DAILY   polyethylene glycol 17 g packet Commonly known as: MIRALAX / GLYCOLAX Take 17 g by mouth 3 (three) times daily as needed for moderate constipation, mild constipation or severe constipation. In water or beverage   QUEtiapine 400 MG 24 hr tablet Commonly known as: SEROQUEL XR Take 800 mg by mouth at bedtime.   Repatha SureClick 140 MG/ML Soaj Generic drug: Evolocumab Inject 140mg  every two weeks   simethicone 80 MG chewable tablet Commonly known as: MYLICON Chew 80 mg by mouth 4 (four) times daily.   Tavaborole 5 % Soln Commonly known as: Kerydin Apply 1 application topically at bedtime. Apply to affected area of left great toenail   tirzepatide 5 MG/0.5ML Pen Commonly known as: MOUNJARO Inject 5 mg into the skin once a week.   tirzepatide 10 MG/0.5ML Pen Commonly known as: MOUNJARO Inject 10 mg into the skin once a week.   valACYclovir 1000 MG tablet Commonly known as: VALTREX TAKE 1 TABLET BY MOUTH ONCE DAILY, take one tablet po bid x 5 days prn outbreak   VITAMIN B COMPLEX PO Take 1 tablet by mouth every other day.   VITAMIN C PO Take 1,000 mg by mouth daily.   Vraylar 3 MG capsule Generic drug: cariprazine Take 3 mg by mouth at bedtime.   zaleplon 10 MG capsule Commonly known as: SONATA Take 10-20 mg by mouth at bedtime.        Allergies:  Allergies  Allergen Reactions   Ephedrine Other (See Comments)    Pt becomes hyper   Oxycodone Other (See Comments)    Panic Attack   Aspirin Other (See  Comments)    REACTION: aggrivates colitis Tolerates 81 mg but unable to take doses >81 mg   Erythromycin Other (See Comments)     REACTION: GI upset   Nsaids     REACTION: aggrivate colitis   Other Other (See Comments)    Unable to take due to history of ulcerative colitis   Rosuvastatin Other (See Comments)    REACTION: increased lfts    Past Medical History:  Diagnosis Date   ADD (attention deficit disorder) 11/19/2012   Allergic rhinitis, cause unspecified    Anxiety state, unspecified    Arthritis    Asymptomatic postmenopausal status (age-related) (natural)    Atypical hyperplasia of left breast 2000   Atypical mole 03/16/2018   R paraspinal mid upper back, excision   Atypical mole 02/17/2018   R spinal upper back, moderate   Atypical mole 08/11/2017   L sternum, excision   Atypical mole 05/27/2017   L post neck, moderate   Benign neoplasm of other and unspecified site of the digestive system    Bipolar disorder    Breast cancer    Left Breast 6mm invasive CA left uiq, dcis left uoq and a lot ADH, ALH in both breasts.   CAD (coronary artery disease)    a. 08/2014 NSTEMI/Cath: LM nl, LAD 50p, D1/D2 min irregs, LCX nl, OM1 40, LPDA nl, RI 99ost (2.800mm vessel->med Rx), RCA nl, AM 60, nl EF; b. 11/2016 MV: EF 74%, no ischemia (performed 2/2 dyspnea & new inf TWI).   Carcinoma of left breast 06/02/2014   Cataract    Chronic diastolic CHF (congestive heart failure)    a. 08/2014 Echo: EF 60-65%, Gr 1 DD, nl LA.   Clotting disorder    on blood thiner, Plavix   Cyst    on Achilles tendon   Depression    Diabetes mellitus    frank   Dysuria    Edema    Family history of malignant neoplasm of gastrointestinal tract    Fibromyalgia    GERD (gastroesophageal reflux disease)    Glaucoma    pre glaucoma, 05/14/2018 pt. denies glaucoma   Headache    migraines   Hiatal hernia    History of alcoholism    History of migraines    History of ovarian cyst    Hx of adenomatous colonic polyps    Insomnia, unspecified    Myocardial infarction 09-04-14   OCD (obsessive compulsive disorder)    Osteopenia  09/25/2016   Femoral neck T -1.1  9/18   Other screening mammogram    Pure hypercholesterolemia    Rosacea    Subacute confusional state 11/19/2012   Tubular adenoma of colon 01/10/11   Ulcerative colitis, unspecified    Unspecified asthma(493.90)    Unspecified essential hypertension    Unspecified hypothyroidism    Unspecified vitamin D deficiency    Vertigo     Past Surgical History:  Procedure Laterality Date   APPENDECTOMY     AXILLARY SENTINEL NODE BIOPSY Left 05/23/2014   Procedure: AXILLARY SENTINEL NODE BIOPSY;  Surgeon: Kieth BrightlySeeplaputhur G Sankar, MD;  Location: [facility];  Service: General;  Laterality: Left;   BREAST BIOPSY  08/1998   on tamoxifen, atypical hyperplasia   BREAST BIOPSY Left 04/2014   BREAST SURGERY Left 08/1998   lumpectomy/ Dr Okey Duprerawford   BREAST SURGERY Bilateral 05/23/2014   Mastectomy   BUNIONECTOMY Right    CARDIAC CATHETERIZATION N/A 09/05/2014   Procedure: Left Heart Cath and Coronary Angiography;  Surgeon: Iran Ouch, MD;  Location: [facility];  Service: Cardiovascular;  Laterality: N/A;   CARDIAC CATHETERIZATION  09/05/2014   CATARACT EXTRACTION W/ INTRAOCULAR LENS IMPLANT Bilateral    CESAREAN SECTION  1996/1997   placenta previa, gest DM, pre-eclampsia   CHOLECYSTECTOMY  1997   adhesions also   COLONOSCOPY  01/2001   Ulcerative colitis   COLONOSCOPY  04/2004   UC, polyp   COLONOSCOPY  12/2006   UC, no polyps   CORONARY STENT INTERVENTION N/A 04/06/2020   Procedure: CORONARY STENT INTERVENTION;  Surgeon: Yvonne Kendall, MD;  Location: [facility];  Service: Cardiovascular;  Laterality: N/A;   DEXA  04/1999 and 2010   normal   ESOPHAGOGASTRODUODENOSCOPY  09/2001   polyp   exercise stress test  11/2003   negative   FEMUR FRACTURE SURGERY Left    LEFT HEART CATH AND CORONARY ANGIOGRAPHY N/A 04/06/2020   Procedure: LEFT HEART CATH AND CORONARY ANGIOGRAPHY;  Surgeon: Antonieta Iba, MD;  Location: [facility];  Service: Cardiovascular;  Laterality: N/A;   MASTECTOMY Bilateral    MECKEL DIVERTICULUM EXCISION  1999   NASAL SINUS SURGERY  05/1997   nuclear stress test  06/2007   negative   precancerous mole removed     RIGHT AND LEFT HEART CATH     with 1 stent placed in March 2022   SIMPLE MASTECTOMY WITH AXILLARY SENTINEL NODE BIOPSY Bilateral 05/23/2014   Procedure: Bilateral simple mastectomy, left sentinel node biopsy ;  Surgeon: Kieth Brightly, MD;  Location: [facility];  Service: General;  Laterality: Bilateral;   Sleep study  11/2007   no apnea, but did snore (done by [facility])   TONSILLECTOMY  1984   TUBAL LIGATION     WISDOM TOOTH EXTRACTION  1980's    Family History  Problem Relation Age of Onset   Coronary artery disease Father    Colon cancer Father    Alzheimer's disease Father    Dementia Father    Colon polyps Father    Coronary artery disease Mother    Hypertension Mother    Osteoporosis Mother    Crohn's disease Mother        crohns colitis   Breast cancer Other        great aunts   Coronary artery disease Other        Uncle (also AAA)   Diabetes Other        remote family history   Stroke Cousin    Esophageal cancer Neg Hx    Rectal cancer Neg Hx    Stomach cancer Neg Hx     Social History:  reports that she quit smoking about 29 years ago. Her smoking use included cigarettes. She has a 1.25 pack-year smoking history. She has never used smokeless tobacco. She reports that she does not drink alcohol and does not use drugs.  REVIEW Of SYSTEMS:  Bone health: She is taking OTC vitamin D3, 1000 units recently  Vitamin D level   Bone density in 2022 shows T score -1.1 at the hip  Recently calcium normal  Lab Results  Component Value Date   CALCIUM 9.6 04/22/2022   PHOS 2.3 01/29/2007   Lab Results  Component Value Date   VD25OH 38.13 04/22/2022   VD25OH 27.85 (L) 12/04/2021   VD25OH 43.86 03/24/2020     HYPERTENSION: Blood pressure is  managed by cardiologist.   She is also on Jardiance  Lab  Results  Component Value Date   CREATININE 0.82 04/22/2022   BUN 18 04/22/2022   NA 142 04/22/2022   K 4.6 04/22/2022   CL 107 04/22/2022   CO2 27 04/22/2022    History of hypercholesterolemia with known CAD: Management by cardiologist and has been on 80 mg atorvastatin since her MI She was taking Zetia which was stopped when she started Repatha Repatha was approved finally again this year and although she took it for months she has not had any for about a month now  Surprisingly her LDL is only 19 compared to 84 with Lipitor and Zetia   Lab Results  Component Value Date   CHOL 90 04/22/2022   CHOL 159 12/04/2021   CHOL 166 02/12/2021   Lab Results  Component Value Date   HDL 54.70 04/22/2022   HDL 52.00 12/04/2021   HDL 50.70 02/12/2021   Lab Results  Component Value Date   LDLCALC 19 04/22/2022   LDLCALC 77 02/12/2021   LDLCALC 55 06/14/2020   Lab Results  Component Value Date   TRIG 83.0 04/22/2022   TRIG 226.0 (H) 12/04/2021   TRIG 194.0 (H) 02/12/2021   Lab Results  Component Value Date   CHOLHDL 2 04/22/2022   CHOLHDL 3 12/04/2021   CHOLHDL 3 02/12/2021   Lab Results  Component Value Date   LDLDIRECT 84.0 12/04/2021   LDLDIRECT 72.0 05/29/2021   LDLDIRECT 125.0 02/07/2020   Likely has high liver functions from fatty liver:   Lab Results  Component Value Date   ALT 42 (H) 04/22/2022      Examination:   BP (!) 130/100 (BP Location: Right Arm, Patient Position: Sitting, Cuff Size: Normal)   Pulse 73   Ht 5\' 6"  (1.676 m)   Wt 196 lb 12.8 oz (89.3 kg)   LMP 08/23/2006 (Approximate) Comment: tubal ligation  SpO2 99%   BMI 31.76 kg/m       Assessments   DIABETES with obesity:  See history of present illness for evaluation of  current management, blood sugar patterns and problems identified  Her A1c is better at 5.2   She is on a regimen of basal insulin using 60 units of  Levemir, Jardiance 25 mg and Mounjaro 10 mg weekly  She has significant weight loss along with better blood sugar control from A1c with switching from Ozempic to [place] She is also exercising regularly now Currently not able to reduce her insulin doses and fasting readings are approximately 135-140 average but as above difficult to assess because of she is sharing her meter with her husband Currently has insurance issues and not able to take [employer] continue with Jardiance, samples given If she has relatively low blood sugars with resuming Mounjaro may need to reduce her Levemir  Hypothyroidism, post ablative:  TSH is consistently normal and has no unusual fatigue  She will stay on 250 mg of LEVOXYL daily with extra 125 mcg once a week  Mixed hyperlipidemia: She has marked reduction of LDL with adding Repatha to her atorvastatin She can likely take only half tablet of atorvastatin when she resumes her Repatha  Abnormal liver function: Etiology unclear and she will discuss with her gastroenterologist, also continue to follow  Vitamin D deficiency: Reminded her to continue supplement regularly, recent level normal  Also B12 normal   Follow-up in 4 months    There are no Patient Instructions on file for this visit. Reather Littler 04/23/2022, 11:43 AM   Note:  This office note was prepared with Insurance underwriter. Any transcriptional errors that result from this process are unintentional.

## 2022-04-25 ENCOUNTER — Encounter: Payer: Self-pay | Admitting: Endocrinology

## 2022-04-27 ENCOUNTER — Other Ambulatory Visit: Payer: Self-pay | Admitting: Cardiovascular Disease

## 2022-04-29 ENCOUNTER — Ambulatory Visit (INDEPENDENT_AMBULATORY_CARE_PROVIDER_SITE_OTHER): Payer: Medicare Other | Admitting: Family Medicine

## 2022-04-29 ENCOUNTER — Encounter: Payer: Self-pay | Admitting: Family Medicine

## 2022-04-29 VITALS — BP 134/80 | HR 92 | Temp 97.8°F | Ht 66.0 in | Wt 201.2 lb

## 2022-04-29 DIAGNOSIS — E1159 Type 2 diabetes mellitus with other circulatory complications: Secondary | ICD-10-CM

## 2022-04-29 DIAGNOSIS — E1169 Type 2 diabetes mellitus with other specified complication: Secondary | ICD-10-CM

## 2022-04-29 DIAGNOSIS — R5382 Chronic fatigue, unspecified: Secondary | ICD-10-CM

## 2022-04-29 DIAGNOSIS — F3130 Bipolar disorder, current episode depressed, mild or moderate severity, unspecified: Secondary | ICD-10-CM

## 2022-04-29 DIAGNOSIS — G3281 Cerebellar ataxia in diseases classified elsewhere: Secondary | ICD-10-CM

## 2022-04-29 DIAGNOSIS — I10 Essential (primary) hypertension: Secondary | ICD-10-CM

## 2022-04-29 DIAGNOSIS — M85859 Other specified disorders of bone density and structure, unspecified thigh: Secondary | ICD-10-CM

## 2022-04-29 DIAGNOSIS — Z170 Estrogen receptor positive status [ER+]: Secondary | ICD-10-CM

## 2022-04-29 DIAGNOSIS — I700 Atherosclerosis of aorta: Secondary | ICD-10-CM

## 2022-04-29 DIAGNOSIS — C50812 Malignant neoplasm of overlapping sites of left female breast: Secondary | ICD-10-CM

## 2022-04-29 DIAGNOSIS — E890 Postprocedural hypothyroidism: Secondary | ICD-10-CM

## 2022-04-29 DIAGNOSIS — K5100 Ulcerative (chronic) pancolitis without complications: Secondary | ICD-10-CM

## 2022-04-29 DIAGNOSIS — Z0000 Encounter for general adult medical examination without abnormal findings: Secondary | ICD-10-CM

## 2022-04-29 DIAGNOSIS — K219 Gastro-esophageal reflux disease without esophagitis: Secondary | ICD-10-CM

## 2022-04-29 DIAGNOSIS — R7401 Elevation of levels of liver transaminase levels: Secondary | ICD-10-CM

## 2022-04-29 DIAGNOSIS — E785 Hyperlipidemia, unspecified: Secondary | ICD-10-CM

## 2022-04-29 DIAGNOSIS — E559 Vitamin D deficiency, unspecified: Secondary | ICD-10-CM

## 2022-04-29 DIAGNOSIS — Z79899 Other long term (current) drug therapy: Secondary | ICD-10-CM

## 2022-04-29 DIAGNOSIS — I25119 Atherosclerotic heart disease of native coronary artery with unspecified angina pectoris: Secondary | ICD-10-CM

## 2022-04-29 DIAGNOSIS — E669 Obesity, unspecified: Secondary | ICD-10-CM

## 2022-04-29 NOTE — Assessment & Plan Note (Signed)
Continues regular GI f/u and colonoscopies Lialda =continues

## 2022-04-29 NOTE — Assessment & Plan Note (Signed)
Continues endocrinology care  Urine microalb sent today since we had a sample Mounjaro helped wt loss- waiting on shipment/ has been hard to get

## 2022-04-29 NOTE — Patient Instructions (Addendum)
If you are interested in the new shingles vaccine (Shingrix) - call your local pharmacy to check on coverage and availability  If affordable, get on a wait list at your pharmacy to get the vaccine.   Schedule your diabetic eye exam   Add some strength training to your routine, this is important for bone and brain health and can reduce your risk of falls and help your body use insulin properly and regulate weight  Light weights, exercise bands , and internet videos are a good way to start  Yoga (chair or regular), machines , floor exercises or a gym with machines are also good options   Increase your D3 to 2000 iu  Continue your calcium and multi vitamin also   Talk to your cardiologist about cutting the statin since AST and ALT are very slightly elevated and LDL is very low    Take care of yourself!

## 2022-04-29 NOTE — Assessment & Plan Note (Signed)
Lab Results  Component Value Date   TSH 2.54 04/22/2022   Sees endocrinology  Taking levothyroxine 125 mcg daily

## 2022-04-29 NOTE — Assessment & Plan Note (Signed)
Sees cardiology  Improved lipids with repatha  No symptoms

## 2022-04-29 NOTE — Assessment & Plan Note (Signed)
Vitamin D level is therapeutic with current supplementation Will inc to 2000 iu D3 in addn to other supplements with D  Disc importance of this to bone and overall health  Last vitamin D Lab Results  Component Value Date   VD25OH 38.13 04/22/2022

## 2022-04-29 NOTE — Assessment & Plan Note (Addendum)
No angina Medically managed  Sees Dr Mariah Milling

## 2022-04-29 NOTE — Assessment & Plan Note (Signed)
Reviewed health habits including diet and exercise and skin cancer prevention Reviewed appropriate screening tests for age  Also reviewed health mt list, fam hx and immunization status , as well as social and family history   See HPI Labs reviewed and ordered Disc option to get shingrix / she is considering  Pap is due-pt decliens today/ low risk , neg HPV in 2019  (used to go to gyn) No mammogram due to mastectomy  Due for eye exam-she will schedule Dexa utd 02/2020 and last was nl  Enc strength building exercise as tolerated  PHQ 1  (under psychiatris care)

## 2022-04-29 NOTE — Assessment & Plan Note (Signed)
Utd with colonoscopy and GI care  On lialda

## 2022-04-29 NOTE — Assessment & Plan Note (Signed)
Protonix daily 40 mg  Enc to watch diet and keep losing weight

## 2022-04-29 NOTE — Assessment & Plan Note (Signed)
Made progress with mounjaro  Then could not get it - pending return to pharmacy  Sees endo   Discussed how this problem influences overall health and the risks it imposes  Reviewed plan for weight loss with lower calorie diet (via better food choices and also portion control or program like weight watchers) and exercise building up to or more than 30 minutes 5 days per week including some aerobic activity

## 2022-04-29 NOTE — Assessment & Plan Note (Signed)
Very mild   With low LDL can likely cut or stop statin (on repatha) She will d/w cardiology

## 2022-04-29 NOTE — Assessment & Plan Note (Signed)
Caring for husband with parkinson's

## 2022-04-29 NOTE — Progress Notes (Signed)
Subjective:    Patient ID: Lindsay Fry, female    DOB: 1960/09/10, 62 y.o.   MRN: [id number]  HPI Here for health maintenance exam and to review chronic medical problems    Wt Readings from Last 3 Encounters:  04/29/22 201 lb 4 oz (91.3 kg)  04/23/22 196 lb 12.8 oz (89.3 kg)  03/05/22 195 lb 2 oz (88.5 kg)   32.48 kg/m  Mounjaro helps wt loss  Is on back order so she did gain a little  Exercising more    Vitals:   04/29/22 1439 04/29/22 1510  BP: (!) 142/78 134/80  Pulse: 92   Temp: 97.8 F (36.6 C)   SpO2: 94%    Tired  Some rough days  Helping care for husband with parkinson's  Fell asleep -running late today   Trying to take care of herself   Immunization History  Administered Date(s) Administered   Influenza Split 12/05/2010, 10/23/2011   Influenza Whole 01/15/2004, 10/15/2006, 10/20/2007, 10/13/2009   Influenza,inj,Quad PF,6+ Mos 11/18/2012, 10/19/2013, 11/10/2015, 10/15/2016, 11/18/2017   PFIZER(Purple Top)SARS-COV-2 Vaccination 06/18/2019, 07/09/2019   Pneumococcal Polysaccharide-23 06/18/2007, 10/15/2016   Td 05/15/2001   Tdap 12/05/2010   Health Maintenance Due  Topic Date Due   Zoster Vaccines- Shingrix (1 of 2) Never done   COVID-19 Vaccine (3 - Pfizer risk series) 08/06/2019   PAP SMEAR-Modifier  01/31/2020   DTaP/Tdap/Td (3 - Td or Tdap) 12/04/2020   OPHTHALMOLOGY EXAM  01/23/2021   Diabetic kidney evaluation - Urine ACR  10/12/2021   Shingrix:  unsure if interested Takes valcyclovir   Pap 01/2017 nl with neg HPV at gyn One partner  No gyn symptoms Declines pap today    Eye exam : is due (was out of ins)  Plans to schedule that with Dr Randon Goldsmith   Colonoscopy 04/2021 with 3 y recall  H/o UC  No mam due to mastectomy for breast cancer   Dexa 02/2020   normal (prior osteopenia)  Falls- one trip  / no injury  Balance is fair - more trouble in the past  Fractures-none  Supplements -ca , D Exercise -trying to do more     (her pool  was closed for repair and may open back up- was water walking) Is in the gym  Has some weights   Last vitamin D Lab Results  Component Value Date   VD25OH 38.13 04/22/2022   Mvi for women  D3 1000 iu    Mood Bipolar and ADD Sees psychiatry and counselor  Felecia Jan is at Dr Shelda Altes office)  Stress- caring for  husband   PHQ    04/29/2022    3:32 PM 03/05/2022    3:06 PM 04/23/2019    4:02 PM 04/22/2017    2:36 PM 04/19/2014    3:45 PM  Depression screen PHQ 2/9  Decreased Interest 0 0 0 1 0  Down, Depressed, Hopeless 0 0 0 0 1  PHQ - 2 Score 0 0 0 1 1  Altered sleeping 0 0 0    Tired, decreased energy 1 0 0    Change in appetite 0 0 0    Feeling bad or failure about yourself  0 0 0    Trouble concentrating 0 0 0    Moving slowly or fidgety/restless 0 0 0    Suicidal thoughts 0 0 0    PHQ-9 Score 1 0 0    Difficult doing work/chores Not difficult at all Not difficult at all Not  difficult at all        HTN bp is stable today  No cp or palpitations or headaches or edema  No side effects to medicines  BP Readings from Last 3 Encounters:  04/29/22 134/80  04/23/22 (!) 130/100  03/05/22 129/80   Pulse Readings from Last 3 Encounters:  04/29/22 92  04/23/22 73  03/05/22 78    Diltiazem ER 300 mg daily - ? No longer on med list due to orthostasis  Benicar 10 mg daily was cut to 5 mg daily    DM2 Sees endocrinology Has been stable, doing well    Hypothyroidism  Pt has no clinical changes No change in energy level/ hair or skin/ edema and no tremor Lab Results  Component Value Date   TSH 2.54 04/22/2022    Sees endocrniology  Levothy 125 mcg daily    Hyperlipidemia in setting of CAD  Lab Results  Component Value Date   CHOL 90 04/22/2022   CHOL 159 12/04/2021   CHOL 166 02/12/2021   Lab Results  Component Value Date   HDL 54.70 04/22/2022   HDL 52.00 12/04/2021   HDL 50.70 02/12/2021   Lab Results  Component Value Date   LDLCALC 19  04/22/2022   LDLCALC 77 02/12/2021   LDLCALC 55 06/14/2020   Lab Results  Component Value Date   TRIG 83.0 04/22/2022   TRIG 226.0 (H) 12/04/2021   TRIG 194.0 (H) 02/12/2021   Lab Results  Component Value Date   CHOLHDL 2 04/22/2022   CHOLHDL 3 12/04/2021   CHOLHDL 3 02/12/2021   Lab Results  Component Value Date   LDLDIRECT 84.0 12/04/2021   LDLDIRECT 72.0 05/29/2021   LDLDIRECT 125.0 02/07/2020    Repatha- was off/now can afford  Zetia 10 mg -holding when on repatha  Atorvastatin 80 -will likely cut in 1/2 soon   Lab Results  Component Value Date   ALT 42 (H) 04/22/2022   AST 43 (H) 04/22/2022   ALKPHOS 71 04/22/2022   BILITOT 0.4 04/22/2022      GERD Ppi Lab Results  Component Value Date   VITAMINB12 537 04/22/2022   Lab Results  Component Value Date   CREATININE 0.82 04/22/2022   BUN 18 04/22/2022   NA 142 04/22/2022   K 4.6 04/22/2022   CL 107 04/22/2022   CO2 27 04/22/2022   Lab Results  Component Value Date   WBC 6.5 04/22/2022   HGB 14.9 04/22/2022   HCT 44.4 04/22/2022   MCV 94.7 04/22/2022   PLT 283.0 04/22/2022    Patient Active Problem List   Diagnosis Date Noted   Stress reaction 10/29/2021   Cerebellar ataxia in diseases classified elsewhere 03/28/2020   Elevated transaminase level 03/27/2020   Current use of proton pump inhibitor 03/19/2020   Hypokalemia 03/07/2020   Aortic atherosclerosis 03/07/2020   MCI (mild cognitive impairment) 08/05/2019   Obesity (BMI 30-39.9) 04/23/2019   Poor balance 12/16/2018   Fatigue 12/16/2018   Migraine 11/12/2018   Cervical radiculopathy due to degenerative joint disease of spine 10/28/2017   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   Osteopenia 09/25/2016   Estrogen deficiency 05/30/2016   Need for hepatitis C screening test 03/19/2016   Carcinoma of overlapping sites of left breast in female, estrogen receptor positive 08/21/2015   Chronic constipation 01/18/2015   NSTEMI (non-ST elevated myocardial  infarction) 09/06/2014   Coronary artery disease involving native coronary artery of native heart with angina pectoris 09/06/2014   DM (diabetes mellitus)  09/05/2014   History of breast cancer 06/02/2014   Sinus tachycardia 04/27/2014   ADD (attention deficit disorder) 11/19/2012   Hypothyroid 05/18/2012   Chronic sinusitis 01/24/2012   Routine general medical examination at a health care facility 10/14/2010   POSTMENOPAUSAL STATUS 06/22/2008   Vitamin D deficiency 03/18/2008   BENIGN NEOPLASM OTH&UNSPEC SITE DIGESTIVE SYSTEM 01/09/2007   HIATAL HERNIA 01/09/2007   COLONIC POLYPS, ADENOMATOUS, HX OF 01/09/2007   Hyperlipidemia associated with type 2 diabetes mellitus (HCC) 01/07/2007   Bipolar I disorder, most recent episode depressed (HCC) 01/07/2007   Essential hypertension 01/07/2007   Allergic rhinitis 01/07/2007   ASTHMA 01/07/2007   GERD 01/07/2007   Ulcerative colitis 01/07/2007   ACNE ROSACEA 01/07/2007   MIGRAINES, HX OF 01/07/2007   Fibromyalgia 10/30/2006   INSOMNIA 10/30/2006   Past Medical History:  Diagnosis Date   ADD (attention deficit disorder) 11/19/2012   Allergic rhinitis, cause unspecified    Anxiety state, unspecified    Arthritis    Asymptomatic postmenopausal status (age-related) (natural)    Atypical hyperplasia of left breast 2000   Atypical mole 03/16/2018   R paraspinal mid upper back, excision   Atypical mole 02/17/2018   R spinal upper back, moderate   Atypical mole 08/11/2017   L sternum, excision   Atypical mole 05/27/2017   L post neck, moderate   Benign neoplasm of other and unspecified site of the digestive system    Bipolar disorder    Breast cancer    Left Breast 6mm invasive CA left uiq, dcis left uoq and a lot ADH, ALH in both breasts.   CAD (coronary artery disease)    a. 08/2014 NSTEMI/Cath: LM nl, LAD 50p, D1/D2 min irregs, LCX nl, OM1 40, LPDA nl, RI 99ost (2.85mm vessel->med Rx), RCA nl, AM 60, nl EF; b. 11/2016 MV: EF 74%, no  ischemia (performed 2/2 dyspnea & new inf TWI).   Carcinoma of left breast 06/02/2014   Cataract    Chronic diastolic CHF (congestive heart failure)    a. 08/2014 Echo: EF 60-65%, Gr 1 DD, nl LA.   Clotting disorder    on blood thiner, Plavix   Cyst    on Achilles tendon   Depression    Diabetes mellitus    frank   Dysuria    Edema    Family history of malignant neoplasm of gastrointestinal tract    Fibromyalgia    GERD (gastroesophageal reflux disease)    Glaucoma    pre glaucoma, 05/14/2018 pt. denies glaucoma   Headache    migraines   Hiatal hernia    History of alcoholism    History of migraines    History of ovarian cyst    Hx of adenomatous colonic polyps    Insomnia, unspecified    Myocardial infarction 09-04-14   OCD (obsessive compulsive disorder)    Osteopenia 09/25/2016   Femoral neck T -1.1  9/18   Other screening mammogram    Pure hypercholesterolemia    Rosacea    Subacute confusional state 11/19/2012   Tubular adenoma of colon 01/10/11   Ulcerative colitis, unspecified    Unspecified asthma(493.90)    Unspecified essential hypertension    Unspecified hypothyroidism    Unspecified vitamin D deficiency    Vertigo    Past Surgical History:  Procedure Laterality Date   APPENDECTOMY     AXILLARY SENTINEL NODE BIOPSY Left 05/23/2014   Procedure: AXILLARY SENTINEL NODE BIOPSY;  Surgeon: Kieth Brightly, MD;  Location: [facility]  ORS;  Service: General;  Laterality: Left;   BREAST BIOPSY  08/1998   on tamoxifen, atypical hyperplasia   BREAST BIOPSY Left 04/2014   BREAST SURGERY Left 08/1998   lumpectomy/ Dr Okey Dupre   BREAST SURGERY Bilateral 05/23/2014   Mastectomy   BUNIONECTOMY Right    CARDIAC CATHETERIZATION N/A 09/05/2014   Procedure: Left Heart Cath and Coronary Angiography;  Surgeon: Iran Ouch, MD;  Location: [facility] INVASIVE CV LAB;  Service: Cardiovascular;  Laterality: N/A;   CARDIAC CATHETERIZATION  09/05/2014   CATARACT EXTRACTION W/  INTRAOCULAR LENS IMPLANT Bilateral    CESAREAN SECTION  1996/1997   placenta previa, gest DM, pre-eclampsia   CHOLECYSTECTOMY  1997   adhesions also   COLONOSCOPY  01/2001   Ulcerative colitis   COLONOSCOPY  04/2004   UC, polyp   COLONOSCOPY  12/2006   UC, no polyps   CORONARY STENT INTERVENTION N/A 04/06/2020   Procedure: CORONARY STENT INTERVENTION;  Surgeon: Yvonne Kendall, MD;  Location: [facility] INVASIVE CV LAB;  Service: Cardiovascular;  Laterality: N/A;   DEXA  04/1999 and 2010   normal   ESOPHAGOGASTRODUODENOSCOPY  09/2001   polyp   exercise stress test  11/2003   negative   FEMUR FRACTURE SURGERY Left    LEFT HEART CATH AND CORONARY ANGIOGRAPHY N/A 04/06/2020   Procedure: LEFT HEART CATH AND CORONARY ANGIOGRAPHY;  Surgeon: Antonieta Iba, MD;  Location: [facility] INVAS[facility] CV LAB;  Service: Cardiovascular;  Laterality: N/A;   MASTECTOMY Bilateral    MECKEL DIVERTICULUM EXCISION  1999   NASAL SINUS SURGERY  05/1997   nuclear stress test  06/2007   negative   precancerous mole removed     RIGHT AND LEFT HEART CATH     with 1 stent placed in March 2022   SIMPLE MASTECTOMY WITH AXILLARY SENTINEL NODE BIOPSY Bilateral 05/23/2014   Procedure: Bilateral simple mastectomy, left sentinel node biopsy ;  Surgeon: Kieth Brightly, MD;  Location: [facility] ORS;  Service: General;  Laterality: Bilateral;   Sleep study  11/2007   no apnea, but did snore (done by [facility])   TONSILLECTOMY  1984   TUBAL LIGATION     WISDOM TOOTH EXTRACTION  1980's   Social History   Tobacco Use   Smoking status: Former    Packs/day: 0.25    Years: 5.00    Additional pack years: 0.00    Total pack years: 1.25    Types: Cigarettes    Quit date: 01/14/1993    Years since quitting: 29.3   Smokeless tobacco: Never  Vaping Use   Vaping Use: Never used  Substance Use Topics   Alcohol use: No    Alcohol/week: 0.0 standard drinks of alcohol    Comment: Recovered ETOH   Drug use: No   Family History   Problem Relation Age of Onset   Coronary artery disease Father    Colon cancer Father    Alzheimer's disease Father    Dementia Father    Colon polyps Father    Coronary artery disease Mother    Hypertension Mother    Osteoporosis Mother    Crohn's disease Mother        crohns colitis   Breast cancer Other        great aunts   Coronary artery disease Other        Uncle (also AAA)   Diabetes Other        remote family history   Stroke Cousin  Esophageal cancer Neg Hx    Rectal cancer Neg Hx    Stomach cancer Neg Hx    Allergies  Allergen Reactions   Ephedrine Other (See Comments)    Pt becomes hyper   Oxycodone Other (See Comments)    Panic Attack   Aspirin Other (See Comments)    REACTION: aggrivates colitis Tolerates 81 mg but unable to take doses >81 mg   Erythromycin Other (See Comments)    REACTION: GI upset   Nsaids     REACTION: aggrivate colitis   Other Other (See Comments)    Unable to take due to history of ulcerative colitis   Rosuvastatin Other (See Comments)    REACTION: increased lfts   Current Outpatient Medications on File Prior to Visit  Medication Sig Dispense Refill   acetaminophen (TYLENOL) 650 MG CR tablet Take 1,300 mg by mouth 2 (two) times daily.     albuterol (VENTOLIN HFA) 108 (90 Base) MCG/ACT inhaler INHALE 1 PUFF INTO THE LUNGS EVERY 4 HOURS AS NEEDED FOR WHEEZING     amantadine (SYMMETREL) 100 MG capsule Take 100 mg by mouth 2 (two) times daily.     Ascorbic Acid (VITAMIN C PO) Take 1,000 mg by mouth daily.     aspirin 81 MG chewable tablet Chew 1 tablet (81 mg total) by mouth daily. 90 tablet 0   atorvastatin (LIPITOR) 80 MG tablet TAKE 1 TABLET BY MOUTH ONCE EVERY EVENING 90 tablet 1   B Complex Vitamins (VITAMIN B COMPLEX PO) Take 1 tablet by mouth every other day.     BAYER MICROLET LANCETS lancets USE AS DIRECTED TO CHECK BLOOD SUGAR TWICE DAILY 200 each 2   Blood Glucose Monitoring Suppl (CONTOUR NEXT MONITOR) w/Device KIT 1  each by Does not apply route in the morning and at bedtime. Use Contour Next meter to check blood sugar twice daily. 1 kit 0   Calcium Carb-Cholecalciferol (CALTRATE 600+D3 PO) Take 1 tablet by mouth daily. 300 mg Vitamin D3     Co-Enzyme Q-10 100 MG CAPS Take 100 mg by mouth daily.     cyclobenzaprine (FLEXERIL) 5 MG tablet Take 1 tablet (5 mg total) by mouth every 8 (eight) hours as needed for muscle spasms. 20 tablet 0   dextroamphetamine (DEXEDRINE SPANSULE) 15 MG 24 hr capsule Take 15 mg by mouth as needed.     diazepam (VALIUM) 10 MG tablet Take 5-10 mg by mouth daily as needed for anxiety.     diphenhydrAMINE (BENADRYL) 25 MG tablet Take 50 mg by mouth at bedtime.     docusate sodium (COLACE) 100 MG capsule Take 100-300 mg by mouth daily as needed for mild constipation or moderate constipation.     empagliflozin (JARDIANCE) 25 MG TABS tablet TAKE 1 TABLET(25 MG) BY MOUTH DAILY 90 tablet 3   Evolocumab (REPATHA SURECLICK) 140 MG/ML SOAJ Inject  every two weeks 2 mL 2   ezetimibe (ZETIA) 10 MG tablet TAKE 1 TABLET(10 MG) BY MOUTH DAILY 90 tablet 3   famotidine (PEPCID) 40 MG tablet Take 40 mg by mouth 2 (two) times daily. 30 min. Before meal and at bedtime     fluticasone (FLONASE) 50 MCG/ACT nasal spray Place 1 spray into both nostrils daily. 16 g 6   gabapentin (NEURONTIN) 300 MG capsule TAKE 1 CAPSULE BY MOUTH 3 TIMES DAILY 270 capsule 1   glucose blood (CONTOUR NEXT TEST) test strip USE AS DIRECTED 100 strip 2   insulin aspart (NOVOLOG FLEXPEN) 100 UNIT/ML  FlexPen Use 10 to 15 units when blood sugars are more than 200 15 mL 1   insulin detemir (LEVEMIR FLEXPEN) 100 UNIT/ML FlexPen USE 60 UNITS SUBCUTANEOUSLY ONCE EVERY EVENING AS DIRECTED 15 mL 5   Insulin Pen Needle (B-D UF III MINI PEN NEEDLES) 31G X 5 MM MISC USE FOR INJECTIONS TWICE DAILY 150 each 3   ketoconazole (NIZORAL) 2 % cream Apply to affected areas one to two times daily as needed. 30 g 11   KRILL OIL PO Take by mouth.      lamoTRIgine (LAMICTAL) 200 MG tablet Take 400 mg by mouth daily.     latanoprost (XALATAN) 0.005 % ophthalmic solution Place 1 drop into both eyes 2 (two) times a week.      LEVOXYL 125 MCG tablet 2 tablets Tuesday- Sunday before breakfast and 3 tablets before breakfast on Monday 100 tablet 1   loperamide (IMODIUM A-D) 2 MG tablet Take 2 mg by mouth daily as needed for diarrhea or loose stools.     Melatonin 10 MG CAPS Take 10 mg by mouth at bedtime.     mesalamine (LIALDA) 1.2 g EC tablet TAKE 2 TABLETS BY MOUTH ONCE DAILY 180 tablet 1   metoprolol succinate (TOPROL-XL) 25 MG 24 hr tablet TAKE 1/2 TABLET BY MOUTH ONCE DAILY AS NEEDED AT BEDTIME FOR SYSTOLIC BLOOD PRESSURE >100 45 tablet 0   Multiple Vitamin (MULTIVITAMIN) tablet Take 1 tablet by mouth daily. Woman 50 plus     nitroGLYCERIN (NITROSTAT) 0.4 MG SL tablet Place 1 tablet (0.4 mg total) under the tongue every 5 (five) minutes as needed for chest pain. 25 tablet 3   olmesartan (BENICAR) 5 MG tablet TAKE 1 TABLET BY MOUTH ONCE DAILY 90 tablet 3   ondansetron (ZOFRAN-ODT) 4 MG disintegrating tablet DISSOLVE 1 TABLET ON THE TONGUE EVERY 6 HOURS AS NEEDED FOR NAUSEA OR VOMIT 90 tablet 1   pantoprazole (PROTONIX) 40 MG tablet TAKE 1 TABLET BY MOUTH TWICE DAILY 180 tablet 1   polyethylene glycol (MIRALAX / GLYCOLAX) 17 g packet Take 17 g by mouth 3 (three) times daily as needed for moderate constipation, mild constipation or severe constipation. In water or beverage     Probiotic Product (CULTURELLE PROBIOTICS PO) Take 1 capsule by mouth daily. For woman     QUEtiapine (SEROQUEL XR) 400 MG 24 hr tablet Take 800 mg by mouth at bedtime.     simethicone (MYLICON) 80 MG chewable tablet Chew 80 mg by mouth 4 (four) times daily.     Tavaborole (KERYDIN) 5 % SOLN Apply 1 application topically at bedtime. Apply to affected area of left great toenail 10 mL 6   tirzepatide (MOUNJARO) 10 MG/0.5ML Pen Inject 10 mg into the skin once a week. 2 mL 5    tirzepatide (MOUNJARO) 5 MG/0.5ML Pen Inject 5 mg into the skin once a week. 2 mL 0   valACYclovir (VALTREX) 1000 MG tablet TAKE 1 TABLET BY MOUTH ONCE DAILY, take one tablet po bid x 5 days prn outbreak 90 tablet 3   VRAYLAR capsule Take 3 mg by mouth at bedtime.     zaleplon (SONATA) 10 MG capsule Take 10-20 mg by mouth at bedtime.     No current facility-administered medications on file prior to visit.    Review of Systems  Constitutional:  Positive for fatigue. Negative for activity change, appetite change, fever and unexpected weight change.  HENT:  Negative for congestion, ear pain, rhinorrhea, sinus pressure and sore throat.  Eyes:  Negative for pain, redness and visual disturbance.  Respiratory:  Negative for cough, shortness of breath and wheezing.   Cardiovascular:  Negative for chest pain and palpitations.  Gastrointestinal:  Negative for abdominal pain, blood in stool, constipation and diarrhea.  Endocrine: Negative for polydipsia and polyuria.  Genitourinary:  Negative for dysuria, frequency and urgency.  Musculoskeletal:  Positive for arthralgias and myalgias. Negative for back pain.  Skin:  Negative for pallor and rash.  Allergic/Immunologic: Negative for environmental allergies.  Neurological:  Negative for dizziness, syncope and headaches.  Hematological:  Negative for adenopathy. Does not bruise/bleed easily.  Psychiatric/Behavioral:  Positive for decreased concentration and dysphoric mood. The patient is nervous/anxious.        Objective:   Physical Exam Constitutional:      General: She is not in acute distress.    Appearance: Normal appearance. She is well-developed. She is obese. She is not ill-appearing or diaphoretic.  HENT:     Head: Normocephalic and atraumatic.     Right Ear: Tympanic membrane, ear canal and external ear normal.     Left Ear: Tympanic membrane, ear canal and external ear normal.     Nose: Nose normal. No congestion.     Mouth/Throat:      Mouth: Mucous membranes are moist.     Pharynx: Oropharynx is clear. No posterior oropharyngeal erythema.  Eyes:     General: No scleral icterus.    Extraocular Movements: Extraocular movements intact.     Conjunctiva/sclera: Conjunctivae normal.     Pupils: Pupils are equal, round, and reactive to light.  Neck:     Thyroid: No thyromegaly.     Vascular: No carotid bruit or JVD.  Cardiovascular:     Rate and Rhythm: Normal rate and regular rhythm.     Pulses: Normal pulses.     Heart sounds: Normal heart sounds.     No gallop.  Pulmonary:     Effort: Pulmonary effort is normal. No respiratory distress.     Breath sounds: Normal breath sounds. No wheezing.     Comments: Good air exch Chest:     Chest wall: No tenderness.  Abdominal:     General: Bowel sounds are normal. There is no distension or abdominal bruit.     Palpations: Abdomen is soft. There is no mass.     Tenderness: There is no abdominal tenderness.     Hernia: No hernia is present.  Genitourinary:    Comments: Bilat mastecomy  No M in axillae or around sites  Musculoskeletal:        General: No tenderness. Normal range of motion.     Cervical back: Normal range of motion and neck supple. No rigidity. No muscular tenderness.     Right lower leg: No edema.     Left lower leg: No edema.     Comments: No kyphosis   Lymphadenopathy:     Cervical: No cervical adenopathy.  Skin:    General: Skin is warm and dry.     Coloration: Skin is not pale.     Findings: No erythema or rash.     Comments: Solar lentigines diffusely  Some sks rosacea  Neurological:     Mental Status: She is alert. Mental status is at baseline.     Cranial Nerves: No cranial nerve deficit.     Motor: No abnormal muscle tone.     Coordination: Coordination normal.     Gait: Gait normal.     Deep  Tendon Reflexes: Reflexes are normal and symmetric. Reflexes normal.  Psychiatric:        Mood and Affect: Mood normal.        Cognition and  Memory: Cognition and memory normal.           Assessment & Plan:   Problem List Items Addressed This Visit       Cardiovascular and Mediastinum   Aortic atherosclerosis    Sees cardiology  Improved lipids with repatha  No symptoms        Atherosclerosis of native coronary artery of native heart with angina pectoris    No angina Medically managed  Sees Dr Mariah Milling      Essential hypertension    bp in fair control at this time  BP Readings from Last 1 Encounters:  04/29/22 134/80  No changes needed Most recent labs reviewed  Disc lifstyle change with low sodium diet and exercise Continues diltiazem ER 300 mg daily stopped by cardiology due to orthostasis and olmesartan cut to 5 mg from 10           Digestive   GERD    Protonix daily 40 mg  Enc to watch diet and keep losing weight       Ulcerative (chronic) pancolitis without complications    Utd with colonoscopy and GI care  On lialda         Endocrine   DM (diabetes mellitus) (Chronic)    Continues endocrinology care  Urine microalb sent today since we had a sample Mounjaro helped wt loss- waiting on shipment/ has been hard to get       Relevant Orders   Microalbumin / creatinine urine ratio   Hyperlipidemia associated with type 2 diabetes mellitus (HCC)    Disc goals for lipids and reasons to control them Rev last labs with pt Rev low sat fat diet in detail   Very low LDL of 19  When she can take/afford repatha does not need the zetia 10  When unable to take repatha she resumes it  Atorvastatin 80 can likely be cut at this point / will disc with cardiology  Ast and alt are slt elevated Overall good control      Hypothyroid    Lab Results  Component Value Date   TSH 2.54 04/22/2022  Sees endocrinology  Taking levothyroxine 125 mcg daily        Nervous and Auditory   Cerebellar ataxia in diseases classified elsewhere    No worse / some better  Under neuro care          Musculoskeletal and Integument   Osteopenia    Dexa 02/2020 was in the normal range/reassuring  One fall/no fx  D level 38 Inst to inc D3 to 2000 iu daily and continue mvi and ca  Strongly enc more strength training        Other   Bipolar I disorder, most recent episode depressed (HCC)    Continues psychiatric care and counseling  Dr Deanne Coffer   Improved lately       Carcinoma of overlapping sites of left breast in female, estrogen receptor positive   Current use of proton pump inhibitor    Enc vit B12 and D Lab Results  Component Value Date   VITAMINB12 537 04/22/2022  Last vitamin D Lab Results  Component Value Date   VD25OH 38.13 04/22/2022        Elevated transaminase level    Very mild  With low LDL can likely cut or stop statin (on repatha) She will d/w cardiology      Fatigue    Caring for husband with parkinson's      Obesity (BMI 30-39.9)    Made progress with mounjaro  Then could not get it - pending return to pharmacy  Sees endo   Discussed how this problem influences overall health and the risks it imposes  Reviewed plan for weight loss with lower calorie diet (via better food choices and also portion control or program like weight watchers) and exercise building up to or more than 30 minutes 5 days per week including some aerobic activity        Routine general medical examination at a health care facility - Primary    Reviewed health habits including diet and exercise and skin cancer prevention Reviewed appropriate screening tests for age  Also reviewed health mt list, fam hx and immunization status , as well as social and family history   See HPI Labs reviewed and ordered Disc option to get shingrix / she is considering  Pap is due-pt decliens today/ low risk , neg HPV in 2019  (used to go to gyn) No mammogram due to mastectomy  Due for eye exam-she will schedule Dexa utd 02/2020 and last was nl  Enc strength building exercise as  tolerated  PHQ 1  (under psychiatris care)       Vitamin D deficiency    Vitamin D level is therapeutic with current supplementation Will inc to 2000 iu D3 in addn to other supplements with D  Disc importance of this to bone and overall health  Last vitamin D Lab Results  Component Value Date   VD25OH 38.13 04/22/2022

## 2022-04-29 NOTE — Assessment & Plan Note (Signed)
Continues psychiatric care and counseling  Dr Deanne Coffer   Improved lately

## 2022-04-29 NOTE — Assessment & Plan Note (Addendum)
Dexa 02/2020 was in the normal range/reassuring  One fall/no fx  D level 38 Inst to inc D3 to 2000 iu daily and continue mvi and ca  Strongly enc more strength training

## 2022-04-29 NOTE — Assessment & Plan Note (Signed)
Disc goals for lipids and reasons to control them Rev last labs with pt Rev low sat fat diet in detail   Very low LDL of 19  When she can take/afford repatha does not need the zetia 10  When unable to take repatha she resumes it  Atorvastatin 80 can likely be cut at this point / will disc with cardiology  Ast and alt are slt elevated Overall good control

## 2022-04-29 NOTE — Assessment & Plan Note (Signed)
bp in fair control at this time  BP Readings from Last 1 Encounters:  04/29/22 134/80   No changes needed Most recent labs reviewed  Disc lifstyle change with low sodium diet and exercise Continues diltiazem ER 300 mg daily stopped by cardiology due to orthostasis and olmesartan cut to 5 mg from 10

## 2022-04-29 NOTE — Assessment & Plan Note (Signed)
No worse / some better  Under neuro care

## 2022-04-29 NOTE — Assessment & Plan Note (Signed)
Enc vit B12 and D Lab Results  Component Value Date   VITAMINB12 537 04/22/2022   Last vitamin D Lab Results  Component Value Date   VD25OH 38.13 04/22/2022

## 2022-04-30 ENCOUNTER — Other Ambulatory Visit: Payer: Self-pay | Admitting: Endocrinology

## 2022-04-30 DIAGNOSIS — E1169 Type 2 diabetes mellitus with other specified complication: Secondary | ICD-10-CM

## 2022-04-30 LAB — MICROALBUMIN / CREATININE URINE RATIO
Creatinine,U: 32 mg/dL
Microalb Creat Ratio: 2.2 mg/g (ref 0.0–30.0)
Microalb, Ur: <0.7 mg/dL (ref 0.0–1.9)

## 2022-05-01 ENCOUNTER — Encounter: Payer: Self-pay | Admitting: *Deleted

## 2022-05-07 ENCOUNTER — Encounter: Payer: Self-pay | Admitting: Cardiovascular Disease

## 2022-05-20 ENCOUNTER — Encounter: Payer: Self-pay | Admitting: Family Medicine

## 2022-05-21 ENCOUNTER — Encounter: Payer: Self-pay | Admitting: Family Medicine

## 2022-05-21 ENCOUNTER — Ambulatory Visit (INDEPENDENT_AMBULATORY_CARE_PROVIDER_SITE_OTHER): Payer: Medicare Other | Admitting: Family Medicine

## 2022-05-21 VITALS — BP 128/80 | HR 79 | Temp 97.8°F | Ht 66.0 in | Wt 194.1 lb

## 2022-05-21 DIAGNOSIS — M545 Low back pain, unspecified: Secondary | ICD-10-CM

## 2022-05-21 NOTE — Assessment & Plan Note (Signed)
Right sided low back pain w/o neuro changes Recurrent and flaring recently , though some improvement today  Discussed use of voltaren gel Flexeril if needed -will call for refill if runs out Discussed use of ice heat Given handout with stretches Can consider PT if no further improvement  Anxious to get back to water exercise for core strength/ this should help Slow walking recommended for now

## 2022-05-21 NOTE — Patient Instructions (Signed)
Use heat on/off  Don't lift and twist at the same time   Try the exercises/stretches (if something hurts don't do it)   Voltaren gel (instead of aleve) if needed  Update if not starting to improve in a week or if worsening

## 2022-05-21 NOTE — Progress Notes (Signed)
Subjective:    Patient ID: Lindsay Fry, female    DOB: [birth year], 62 y.o.   MRN: [id number]  HPI Pt presents with low back pain on R  Wt Readings from Last 3 Encounters:  05/21/22 194 lb 2 oz (88.1 kg)  04/29/22 201 lb 4 oz (91.3 kg)  04/23/22 196 lb 12.8 oz (89.3 kg)   31.33 kg/m  Vitals:   05/21/22 1107  BP: 128/80  Pulse: 79  Temp: 97.8 F (36.6 C)  SpO2: 98%    Low  back pain  Started Friday night after lifting a heavy suit case Twisted and felt it on the R  Has had this before  Bothersome  Sharp in nature and a pulling sensation   Worse with getting up out of bed or twisting to L or bending forward   Best when bending backwards   Aleve helps a little  Has flexeril - helps some   Has scoliosis baseline Osteopenia baseline   Opened her pool yesterday- excited to get back to it  In past PT was helpful    Using some heat     Last LS film DG Lumbar Spine Complete (Accession [id number]) (Order [id number]) Imaging Date: 04/10/2021 Department: [facility] Ordering/Authorizing: Kameshia Madruga, Audrie Gallus, MD   Exam Status  Status  Final [99]   PACS Intelerad Image Link   Show images for DG Lumbar Spine Complete Study Result  Narrative & Impression  CLINICAL DATA:  Right lower back pain.   EXAM: LUMBAR SPINE - COMPLETE 4+ VIEW   COMPARISON:  August 05, 2016.   FINDINGS: There is no evidence of lumbar spine fracture. Alignment is normal. Mild to moderate degenerative disc disease is noted at L1-2 and L2-3 with anterior osteophyte formation.   IMPRESSION: Mild to moderate multilevel degenerative disc disease. No acute abnormality seen.    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   Elevated transaminase level 03/27/2020   Current  use of proton pump inhibitor 03/19/2020   Hypokalemia 03/07/2020   Aortic atherosclerosis (HCC) 03/07/2020   MCI (mild cognitive impairment) 08/05/2019   Obesity (BMI 30-39.9) 04/23/2019   Poor balance 12/16/2018   Fatigue 12/16/2018   Migraine 11/12/2018   Cervical radiculopathy due to degenerative joint disease of spine 10/28/2017   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   Osteopenia 09/25/2016   Estrogen deficiency 05/30/2016   Need for hepatitis C screening test 03/19/2016   Carcinoma of overlapping sites of left breast in female, estrogen receptor positive (HCC) 08/21/2015   Chronic constipation 01/18/2015   NSTEMI (non-ST elevated myocardial infarction) (HCC) 09/06/2014   Atherosclerosis of native coronary artery of native heart with angina pectoris (HCC) 09/06/2014   DM (diabetes mellitus) (HCC) 09/05/2014   History of breast cancer 06/02/2014   Sinus tachycardia 04/27/2014   ADD (attention deficit disorder) 11/19/2012   Low back pain 08/05/2012   Hypothyroid 05/18/2012   Chronic sinusitis 01/24/2012   Routine general medical examination at a health care facility 10/14/2010   POSTMENOPAUSAL STATUS 06/22/2008   Vitamin D deficiency 03/18/2008   BENIGN NEOPLASM OTH&UNSPEC SITE DIGESTIVE SYSTEM 01/09/2007   HIATAL HERNIA 01/09/2007   COLONIC POLYPS, ADENOMATOUS, HX OF 01/09/2007   Hyperlipidemia associated with type 2 diabetes mellitus (HCC) 01/07/2007   Bipolar I disorder, most recent episode depressed (HCC) 01/07/2007   Essential  hypertension 01/07/2007   Allergic rhinitis 01/07/2007   ASTHMA 01/07/2007   GERD 01/07/2007   ACNE ROSACEA 01/07/2007   MIGRAINES, HX OF 01/07/2007   Fibromyalgia 10/30/2006   INSOMNIA 10/30/2006   Past Medical History:  Diagnosis Date   ADD (attention deficit disorder) 11/19/2012   Allergic rhinitis, cause unspecified    Anxiety state, unspecified    Arthritis    Asymptomatic postmenopausal status (age-related) (natural)    Atypical hyperplasia of  left breast 2000   Atypical mole 03/16/2018   R paraspinal mid upper back, excision   Atypical mole 02/17/2018   R spinal upper back, moderate   Atypical mole 08/11/2017   L sternum, excision   Atypical mole 05/27/2017   L post neck, moderate   Benign neoplasm of other and unspecified site of the digestive system    Bipolar disorder (HCC)    Breast cancer (HCC)    Left Breast 6mm invasive CA left uiq, dcis left uoq and a lot ADH, ALH in both breasts.   CAD (coronary artery disease)    a. 08/2014 NSTEMI/Cath: LM nl, LAD 50p, D1/D2 min irregs, LCX nl, OM1 40, LPDA nl, RI 99ost (2.75mm vessel->med Rx), RCA nl, AM 60, nl EF; b. 11/2016 MV: EF 74%, no ischemia (performed 2/2 dyspnea & new inf TWI).   Carcinoma of left breast (HCC) 06/02/2014   Cataract    Chronic diastolic CHF (congestive heart failure) (HCC)    a. 08/2014 Echo: EF 60-65%, Gr 1 DD, nl LA.   Clotting disorder (HCC)    on blood thiner, Plavix   Cyst    on Achilles tendon   Depression    Diabetes mellitus (HCC)    frank   Dysuria    Edema    Family history of malignant neoplasm of gastrointestinal tract    Fibromyalgia    GERD (gastroesophageal reflux disease)    Glaucoma    pre glaucoma, 05/14/2018 pt. denies glaucoma   Headache    migraines   Hiatal hernia    History of alcoholism (HCC)    History of migraines    History of ovarian cyst    Hx of adenomatous colonic polyps    Insomnia, unspecified    Myocardial infarction (HCC) 09-04-14   OCD (obsessive compulsive disorder)    Osteopenia 09/25/2016   Femoral neck T -1.1  9/18   Other screening mammogram    Pure hypercholesterolemia    Rosacea    Subacute confusional state 11/19/2012   Tubular adenoma of colon 01/10/11   Ulcerative colitis, unspecified    Unspecified asthma(493.90)    Unspecified essential hypertension    Unspecified hypothyroidism    Unspecified vitamin D deficiency    Vertigo    Past Surgical History:  Procedure Laterality Date    APPENDECTOMY     AXILLARY SENTINEL NODE BIOPSY Left 05/23/2014   Procedure: AXILLARY SENTINEL NODE BIOPSY;  Surgeon: Kieth Brightly, MD;  Location: [facility];  Service: General;  Laterality: Left;   BREAST BIOPSY  08/1998   on tamoxifen, atypical hyperplasia   BREAST BIOPSY Left 04/2014   BREAST SURGERY Left 08/1998   lumpectomy/ Dr Okey Dupre   BREAST SURGERY Bilateral 05/23/2014   Mastectomy   BUNIONECTOMY Right    CARDIAC CATHETERIZATION N/A 09/05/2014   Procedure: Left Heart Cath and Coronary Angiography;  Surgeon: Iran Ouch, MD;  Location: [facility];  Service: Cardiovascular;  Laterality: N/A;   CARDIAC CATHETERIZATION  09/05/2014   CATARACT EXTRACTION W/ INTRAOCULAR  LENS IMPLANT Bilateral    CESAREAN SECTION  1996/1997   placenta previa, gest DM, pre-eclampsia   CHOLECYSTECTOMY  1997   adhesions also   COLONOSCOPY  01/2001   Ulcerative colitis   COLONOSCOPY  04/2004   UC, polyp   COLONOSCOPY  12/2006   UC, no polyps   CORONARY STENT INTERVENTION N/A 04/06/2020   Procedure: CORONARY STENT INTERVENTION;  Surgeon: Yvonne Kendall, MD;  Location: [facility];  Service: Cardiovascular;  Laterality: N/A;   DEXA  04/1999 and 2010   normal   ESOPHAGOGASTRODUODENOSCOPY  09/2001   polyp   exercise stress test  11/2003   negative   FEMUR FRACTURE SURGERY Left    LEFT HEART CATH AND CORONARY ANGIOGRAPHY N/A 04/06/2020   Procedure: LEFT HEART CATH AND CORONARY ANGIOGRAPHY;  Surgeon: Antonieta Iba, MD;  Location: [facility];  Service: Cardiovascular;  Laterality: N/A;   MASTECTOMY Bilateral    MECKEL DIVERTICULUM EXCISION  1999   NASAL SINUS SURGERY  05/1997   nuclear stress test  06/2007   negative   precancerous mole removed     RIGHT AND LEFT HEART CATH     with 1 stent placed in March 2022   SIMPLE MASTECTOMY WITH AXILLARY SENTINEL NODE BIOPSY Bilateral 05/23/2014   Procedure: Bilateral simple mastectomy, left sentinel node biopsy  ;  Surgeon: Kieth Brightly, MD;  Location: [facility];  Service: General;  Laterality: Bilateral;   Sleep study  11/2007   no apnea, but did snore (done by [facility])   TONSILLECTOMY  1984   TUBAL LIGATION     WISDOM TOOTH EXTRACTION  1980's   Social History   Tobacco Use   Smoking status: Former    Packs/day: 0.25    Years: 5.00    Additional pack years: 0.00    Total pack years: 1.25    Types: Cigarettes    Quit date: 01/14/1993    Years since quitting: 29.3   Smokeless tobacco: Never  Vaping Use   Vaping Use: Never used  Substance Use Topics   Alcohol use: No    Alcohol/week: 0.0 standard drinks of alcohol    Comment: Recovered ETOH   Drug use: No   Family History  Problem Relation Age of Onset   Coronary artery disease Father    Colon cancer Father    Alzheimer's disease Father    Dementia Father    Colon polyps Father    Coronary artery disease Mother    Hypertension Mother    Osteoporosis Mother    Crohn's disease Mother        crohns colitis   Breast cancer Other        great aunts   Coronary artery disease Other        Uncle (also AAA)   Diabetes Other        remote family history   Stroke Cousin    Esophageal cancer Neg Hx    Rectal cancer Neg Hx    Stomach cancer Neg Hx    Allergies  Allergen Reactions   Ephedrine Other (See Comments)    Pt becomes hyper   Oxycodone Other (See Comments)    Panic Attack   Aspirin Other (See Comments)    REACTION: aggrivates colitis Tolerates 81 mg but unable to take doses >81 mg   Erythromycin Other (See Comments)    REACTION: GI upset   Nsaids     REACTION: aggrivate colitis   Other Other (  See Comments)    Unable to take due to history of ulcerative colitis   Rosuvastatin Other (See Comments)    REACTION: increased lfts   Current Outpatient Medications on File Prior to Visit  Medication Sig Dispense Refill   acetaminophen (TYLENOL) 650 MG CR tablet Take 1,300 mg by mouth 2 (two) times daily.      albuterol (VENTOLIN HFA) 108 (90 Base) MCG/ACT inhaler INHALE 1 PUFF INTO THE LUNGS EVERY 4 HOURS AS NEEDED FOR WHEEZING     amantadine (SYMMETREL) 100 MG capsule Take 100 mg by mouth 2 (two) times daily.     Ascorbic Acid (VITAMIN C PO) Take 1,000 mg by mouth daily.     aspirin 81 MG chewable tablet Chew 1 tablet (81 mg total) by mouth daily. 90 tablet 0   atorvastatin (LIPITOR) 80 MG tablet TAKE 1 TABLET BY MOUTH ONCE EVERY EVENING 90 tablet 1   B Complex Vitamins (VITAMIN B COMPLEX PO) Take 1 tablet by mouth every other day.     BAYER MICROLET LANCETS lancets USE AS DIRECTED TO CHECK BLOOD SUGAR TWICE DAILY 200 each 2   Blood Glucose Monitoring Suppl (CONTOUR NEXT MONITOR) w/Device KIT 1 each by Does not apply route in the morning and at bedtime. Use Contour Next meter to check blood sugar twice daily. 1 kit 0   Calcium Carb-Cholecalciferol (CALTRATE 600+D3 PO) Take 1 tablet by mouth daily. 300 mg Vitamin D3     Co-Enzyme Q-10 100 MG CAPS Take 100 mg by mouth daily.     cyclobenzaprine (FLEXERIL) 5 MG tablet Take 1 tablet (5 mg total) by mouth every 8 (eight) hours as needed for muscle spasms. 20 tablet 0   dextroamphetamine (DEXEDRINE SPANSULE) 15 MG 24 hr capsule Take 15 mg by mouth as needed.     diazepam (VALIUM) 10 MG tablet Take 5-10 mg by mouth daily as needed for anxiety.     diphenhydrAMINE (BENADRYL) 25 MG tablet Take 50 mg by mouth at bedtime.     docusate sodium (COLACE) 100 MG capsule Take 100-300 mg by mouth daily as needed for mild constipation or moderate constipation.     empagliflozin (JARDIANCE) 25 MG TABS tablet TAKE 1 TABLET(25 MG) BY MOUTH DAILY 90 tablet 3   Evolocumab (REPATHA SURECLICK) 140 MG/ML SOAJ Inject 140mg  every two weeks 2 mL 2   ezetimibe (ZETIA) 10 MG tablet TAKE 1 TABLET(10 MG) BY MOUTH DAILY 90 tablet 3   famotidine (PEPCID) 40 MG tablet Take 40 mg by mouth 2 (two) times daily. 30 min. Before meal and at bedtime     fluticasone (FLONASE) 50 MCG/ACT nasal  spray Place 1 spray into both nostrils daily. 16 g 6   gabapentin (NEURONTIN) 300 MG capsule TAKE 1 CAPSULE BY MOUTH 3 TIMES DAILY 270 capsule 1   glucose blood (CONTOUR NEXT TEST) test strip USE AS DIRECTED. 100 each 5   insulin aspart (NOVOLOG FLEXPEN) 100 UNIT/ML FlexPen Use 10 to 15 units when blood sugars are more than 200 15 mL 1   insulin detemir (LEVEMIR FLEXPEN) 100 UNIT/ML FlexPen USE 60 UNITS SUBCUTANEOUSLY ONCE EVERY EVENING AS DIRECTED 15 mL 5   Insulin Pen Needle (B-D UF III MINI PEN NEEDLES) 31G X 5 MM MISC USE FOR INJECTIONS TWICE DAILY 150 each 3   ketoconazole (NIZORAL) 2 % cream Apply to affected areas one to two times daily as needed. 30 g 11   KRILL OIL PO Take by mouth.     lamoTRIgine (  LAMICTAL) 200 MG tablet Take 400 mg by mouth daily.     latanoprost (XALATAN) 0.005 % ophthalmic solution Place 1 drop into both eyes 2 (two) times a week.      LEVOXYL 125 MCG tablet 2 tablets Tuesday- Sunday before breakfast and 3 tablets before breakfast on Monday 100 tablet 1   loperamide (IMODIUM A-D) 2 MG tablet Take 2 mg by mouth daily as needed for diarrhea or loose stools.     Melatonin 10 MG CAPS Take 10 mg by mouth at bedtime.     mesalamine (LIALDA) 1.2 g EC tablet TAKE 2 TABLETS BY MOUTH ONCE DAILY 180 tablet 1   metoprolol succinate (TOPROL-XL) 25 MG 24 hr tablet TAKE 1/2 TABLET BY MOUTH ONCE DAILY AS NEEDED AT BEDTIME FOR SYSTOLIC BLOOD PRESSURE >100 45 tablet 0   Multiple Vitamin (MULTIVITAMIN) tablet Take 1 tablet by mouth daily. Woman 50 plus     nitroGLYCERIN (NITROSTAT) 0.4 MG SL tablet Place 1 tablet (0.4 mg total) under the tongue every 5 (five) minutes as needed for chest pain. 25 tablet 3   olmesartan (BENICAR) 5 MG tablet TAKE 1 TABLET BY MOUTH ONCE DAILY 90 tablet 3   ondansetron (ZOFRAN-ODT) 4 MG disintegrating tablet DISSOLVE 1 TABLET ON THE TONGUE EVERY 6 HOURS AS NEEDED FOR NAUSEA OR VOMIT 90 tablet 1   pantoprazole (PROTONIX) 40 MG tablet TAKE 1 TABLET BY MOUTH  TWICE DAILY 180 tablet 1   polyethylene glycol (MIRALAX / GLYCOLAX) 17 g packet Take 17 g by mouth 3 (three) times daily as needed for moderate constipation, mild constipation or severe constipation. In water or beverage     Probiotic Product (CULTURELLE PROBIOTICS PO) Take 1 capsule by mouth daily. For woman     QUEtiapine (SEROQUEL XR) 400 MG 24 hr tablet Take 800 mg by mouth at bedtime.     simethicone (MYLICON) 80 MG chewable tablet Chew 80 mg by mouth 4 (four) times daily.     Tavaborole (KERYDIN) 5 % SOLN Apply 1 application topically at bedtime. Apply to affected area of left great toenail 10 mL 6   tirzepatide (MOUNJARO) 10 MG/0.5ML Pen Inject 10 mg into the skin once a week. 2 mL 5   valACYclovir (VALTREX) 1000 MG tablet TAKE 1 TABLET BY MOUTH ONCE DAILY, take one tablet po bid x 5 days prn outbreak 90 tablet 3   VRAYLAR capsule Take 3 mg by mouth at bedtime.     zaleplon (SONATA) 10 MG capsule Take 10-20 mg by mouth at bedtime.     No current facility-administered medications on file prior to visit.    Review of Systems  Constitutional:  Negative for activity change, appetite change, fatigue, fever and unexpected weight change.  HENT:  Negative for congestion, ear pain, rhinorrhea, sinus pressure and sore throat.   Eyes:  Negative for pain, redness and visual disturbance.  Respiratory:  Negative for cough, shortness of breath and wheezing.   Cardiovascular:  Negative for chest pain and palpitations.  Gastrointestinal:  Negative for abdominal pain, blood in stool, constipation and diarrhea.  Endocrine: Negative for polydipsia and polyuria.  Genitourinary:  Negative for dysuria, frequency and urgency.  Musculoskeletal:  Positive for back pain. Negative for arthralgias and myalgias.  Skin:  Negative for pallor and rash.  Allergic/Immunologic: Negative for environmental allergies.  Neurological:  Negative for dizziness, syncope and headaches.  Hematological:  Negative for adenopathy.  Does not bruise/bleed easily.  Psychiatric/Behavioral:  Negative for decreased concentration and dysphoric mood.  The patient is not nervous/anxious.        Objective:   Physical Exam Constitutional:      General: She is not in acute distress.    Appearance: Normal appearance. She is well-developed. She is obese. She is not ill-appearing or diaphoretic.  HENT:     Head: Normocephalic and atraumatic.  Eyes:     General: No scleral icterus.    Conjunctiva/sclera: Conjunctivae normal.     Pupils: Pupils are equal, round, and reactive to light.  Cardiovascular:     Rate and Rhythm: Normal rate and regular rhythm.  Pulmonary:     Effort: Pulmonary effort is normal.     Breath sounds: Normal breath sounds. No wheezing or rales.  Abdominal:     General: Bowel sounds are normal. There is no distension.     Palpations: Abdomen is soft.     Tenderness: There is no abdominal tenderness.  Musculoskeletal:        General: Tenderness present.     Cervical back: Normal range of motion and neck supple.     Lumbar back: Spasms and tenderness present. No swelling, edema or bony tenderness. Decreased range of motion. Negative right straight leg raise test and negative left straight leg raise test. Scoliosis present.     Comments: Some pain with spine flex past 90 deg Normal ext  Some pain with L sided flex  Very little tenderness- R lumbar musculature     Lymphadenopathy:     Cervical: No cervical adenopathy.  Skin:    General: Skin is warm and dry.     Coloration: Skin is not pale.     Findings: No erythema or rash.  Neurological:     Mental Status: She is alert.     Cranial Nerves: No cranial nerve deficit.     Sensory: No sensory deficit.     Motor: No weakness, atrophy or abnormal muscle tone.     Coordination: Coordination normal.     Deep Tendon Reflexes: Reflexes are normal and symmetric. Reflexes normal.     Comments: Negative SLR  Psychiatric:        Mood and Affect: Mood  normal.           Assessment & Plan:   Problem List Items Addressed This Visit       Other   Low back pain - Primary    Right sided low back pain w/o neuro changes Recurrent and flaring recently , though some improvement today  Discussed use of voltaren gel Flexeril if needed -will call for refill if runs out Discussed use of ice heat Given handout with stretches Can consider PT if no further improvement  Anxious to get back to water exercise for core strength/ this should help Slow walking recommended for now

## 2022-05-24 ENCOUNTER — Encounter: Payer: Self-pay | Admitting: Internal Medicine

## 2022-05-24 ENCOUNTER — Inpatient Hospital Stay: Payer: Medicare Other | Attending: Internal Medicine

## 2022-05-24 ENCOUNTER — Inpatient Hospital Stay: Payer: Medicare Other | Admitting: Internal Medicine

## 2022-05-24 ENCOUNTER — Encounter: Payer: Self-pay | Admitting: Endocrinology

## 2022-05-24 VITALS — BP 140/86 | HR 82 | Temp 97.2°F | Ht 66.0 in | Wt 194.4 lb

## 2022-05-24 DIAGNOSIS — Z170 Estrogen receptor positive status [ER+]: Secondary | ICD-10-CM

## 2022-05-24 DIAGNOSIS — C50812 Malignant neoplasm of overlapping sites of left female breast: Secondary | ICD-10-CM | POA: Insufficient documentation

## 2022-05-24 LAB — COMPREHENSIVE METABOLIC PANEL
ALT: 34 U/L (ref 0–44)
AST: 33 U/L (ref 15–41)
Albumin: 4.4 g/dL (ref 3.5–5.0)
Alkaline Phosphatase: 73 U/L (ref 38–126)
Anion gap: 9 (ref 5–15)
BUN: 14 mg/dL (ref 8–23)
CO2: 29 mmol/L (ref 22–32)
Calcium: 9.7 mg/dL (ref 8.9–10.3)
Chloride: 101 mmol/L (ref 98–111)
Creatinine, Ser: 0.86 mg/dL (ref 0.44–1.00)
GFR, Estimated: >60 mL/min (ref >60)
Glucose, Bld: 108 mg/dL — ABNORMAL HIGH (ref 70–99)
Potassium: 4.6 mmol/L (ref 3.5–5.1)
Sodium: 139 mmol/L (ref 135–145)
Total Bilirubin: 0.5 mg/dL (ref 0.3–1.2)
Total Protein: 7.3 g/dL (ref 6.5–8.1)

## 2022-05-24 LAB — CBC WITH DIFFERENTIAL/PLATELET
Abs Immature Granulocytes: 0.01 10*3/uL (ref 0.00–0.07)
Basophils Absolute: 0.1 10*3/uL (ref 0.0–0.1)
Basophils Relative: 1 %
Eosinophils Absolute: 0.5 10*3/uL (ref 0.0–0.5)
Eosinophils Relative: 7 %
HCT: 49.4 % — ABNORMAL HIGH (ref 36.0–46.0)
Hemoglobin: 16.1 g/dL — ABNORMAL HIGH (ref 12.0–15.0)
Immature Granulocytes: 0 %
Lymphocytes Relative: 31 %
Lymphs Abs: 2.2 10*3/uL (ref 0.7–4.0)
MCH: 30.4 pg (ref 26.0–34.0)
MCHC: 32.6 g/dL (ref 30.0–36.0)
MCV: 93.4 fL (ref 80.0–100.0)
Monocytes Absolute: 0.7 10*3/uL (ref 0.1–1.0)
Monocytes Relative: 9 %
Neutro Abs: 3.6 10*3/uL (ref 1.7–7.7)
Neutrophils Relative %: 52 %
Platelets: 304 10*3/uL (ref 150–400)
RBC: 5.29 MIL/uL — ABNORMAL HIGH (ref 3.87–5.11)
RDW: 12.7 % (ref 11.5–15.5)
WBC: 7.2 10*3/uL (ref 4.0–10.5)
nRBC: 0 % (ref 0.0–0.2)

## 2022-05-24 NOTE — Progress Notes (Signed)
I connected with Lindsay Fry on 05/24/22 at  1:30 PM EDT by video enabled telemedicine visit and verified that I am speaking with the correct person using two identifiers.  I discussed the limitations, risks, security and privacy concerns of performing an evaluation and management service by telemedicine and the availability of in-person appointments. I also discussed with the patient that there may be a patient responsible charge related to this service. The patient expressed understanding and agreed to proceed.    Other persons participating in the visit and their role in the encounter: RN/medical reconciliation Patient's location: home Provider's location: office  Oncology History Overview Note  1.  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  2, April of 2016 patient had  stereotactic biopsy of abnormal breast lesion in the left breastwhich was positive for invasive carcinoma and ductal carcinoma in situ.  Patient underwent bilateral mastectomy  May 9 , 2016  Patient has invasive carcinoma and left breast with significant changes in the right breast ductal carcinoma in situ patient underwent bilateral mastectomy.  Left breast: T1 b N0 M0 stage IB estrogen and progesterone receptor HER-2/neu- Not overexpressed  2.    Multi-gene analysis with  MAMOPRINT low risk for recurrent disease  Patient was started on letrozole and calcium and vitamin D (June, 2016)   # Hx Anxiety/depression [Dr.kaur; [place]]; Hx of colitis [s/p colo- May 2019]; Obesity -------------------------------------------------------------------------------------  # SURVIVORSHIP-P  DIAGNOSIS:LEFT BREAST CA ER-POS  STAGE: I        ;GOALS: curative  CURRENT/MOST RECENT THERAPY: Letrozole-x5 years stopped August 2021.    History of breast cancer  05/23/2014 Initial Diagnosis   Carcinoma of left breast   Carcinoma of overlapping  sites of left breast in female, estrogen receptor positive (HCC)   Chief Complaint: breast cancer    History of present illness:Lindsay Fry 62 y.o.  female with history of bipolar, CAD obesity left stage I ER/PR positive HER2 negative breast cancer s/p adjuvant AI for 5 years currently on surveillance is here for a follow up.   Patient has no complaints at this time.  Denies any worsening shortness of breath or cough.  Complains of increase in facial hair since stopping endocrine therapy.  Observation/objective: Alert & oriented x 3. In No acute distress.   Assessment and plan: Carcinoma of overlapping sites of left breast in female, estrogen receptor positive (HCC) # LEFT Breast Stage I s/p mastec [prophy right mastec] on Low risk mammoprint- finished Letrozole [5 years; AUG 2021]; STABLE-no clinical evidence of recurrence.   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   # Obesity/? OSA- s/p sleep study [2015]; not on CPAP- stable.   # CAD [s/p stenting; March 2022]- on asprin/plavix- stable.   # Ulcerative colitis- s/p colo- April 2023-stable.   # BMD- Sep 2022-FEB T score= -1; normal. Continue ca+vit D- stable.   # DISPOSITION: # follow up in 12 monthsMD/labs-cbc/cmp; Dr.B    Follow-up instructions:  I discussed the assessment and treatment plan with the patient.  The patient was provided an opportunity to ask questions and all were answered.  The patient agreed with the plan and demonstrated understanding of instructions.  The patient was advised to call back or seek an in person evaluation if the symptoms worsen or if the condition fails to improve as anticipated.    Dr. Louretta Shorten [facility] at [facility] 05/24/2022 1:47 PM

## 2022-05-24 NOTE — Telephone Encounter (Signed)
I called and spoke with the patient she states that she contacted the pharmacy to see if they would be able to refill it early. They advised to her that she would need to call the insurance company and they might be able to give her approval for replacements. Also I advised her to call us back if she needs anything from [place].

## 2022-05-24 NOTE — Progress Notes (Signed)
No concerns today 

## 2022-05-24 NOTE — Assessment & Plan Note (Signed)
#   LEFT Breast Stage I s/p mastec [prophy right mastec] on Low risk mammoprint- finished Letrozole [5 years; AUG 2021]; STABLE-no clinical evidence of recurrence.   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   # Obesity/? OSA- s/p sleep study [2015]; not on CPAP- stable.   # CAD [s/p stenting; March 2022]- on asprin/plavix- stable.   # Ulcerative colitis- s/p colo- April 2023-stable.   # BMD- Sep 2022-FEB T score= -1; normal. Continue ca+vit D- stable.   # DISPOSITION: # follow up in 12 monthsMD/labs-cbc/cmp; Dr.B

## 2022-05-28 ENCOUNTER — Encounter: Payer: Self-pay | Admitting: Cardiovascular Disease

## 2022-05-28 DIAGNOSIS — E1169 Type 2 diabetes mellitus with other specified complication: Secondary | ICD-10-CM

## 2022-05-29 MED ORDER — EZETIMIBE 10 MG PO TABS
ORAL_TABLET | ORAL | 3 refills | Status: DC
Start: 2022-05-29 — End: 2022-09-09

## 2022-06-07 ENCOUNTER — Encounter: Payer: Self-pay | Admitting: Endocrinology

## 2022-06-07 DIAGNOSIS — E1169 Type 2 diabetes mellitus with other specified complication: Secondary | ICD-10-CM

## 2022-06-12 ENCOUNTER — Other Ambulatory Visit (HOSPITAL_COMMUNITY): Payer: Self-pay

## 2022-06-12 ENCOUNTER — Other Ambulatory Visit: Payer: Self-pay

## 2022-06-12 MED ORDER — TIRZEPATIDE 7.5 MG/0.5ML ~~LOC~~ SOAJ: 7.5000 mg | SUBCUTANEOUS | 5 refills | Status: DC

## 2022-06-24 ENCOUNTER — Encounter: Payer: Self-pay | Admitting: Endocrinology

## 2022-06-24 ENCOUNTER — Other Ambulatory Visit: Payer: Self-pay | Admitting: Endocrinology

## 2022-06-24 DIAGNOSIS — E669 Obesity, unspecified: Secondary | ICD-10-CM

## 2022-06-26 ENCOUNTER — Other Ambulatory Visit: Payer: Self-pay | Admitting: Cardiovascular Disease

## 2022-06-27 ENCOUNTER — Encounter: Payer: Self-pay | Admitting: Family Medicine

## 2022-06-27 DIAGNOSIS — E890 Postprocedural hypothyroidism: Secondary | ICD-10-CM

## 2022-06-27 DIAGNOSIS — E1159 Type 2 diabetes mellitus with other circulatory complications: Secondary | ICD-10-CM

## 2022-07-02 ENCOUNTER — Encounter: Payer: Self-pay | Admitting: Cardiovascular Disease

## 2022-07-13 ENCOUNTER — Other Ambulatory Visit: Payer: Self-pay | Admitting: Endocrinology

## 2022-07-13 DIAGNOSIS — E890 Postprocedural hypothyroidism: Secondary | ICD-10-CM

## 2022-07-30 ENCOUNTER — Other Ambulatory Visit: Payer: Self-pay | Admitting: Internal Medicine

## 2022-07-30 DIAGNOSIS — R110 Nausea: Secondary | ICD-10-CM

## 2022-08-03 ENCOUNTER — Other Ambulatory Visit: Payer: Self-pay | Admitting: Internal Medicine

## 2022-08-03 ENCOUNTER — Other Ambulatory Visit: Payer: Self-pay | Admitting: Family Medicine

## 2022-08-06 ENCOUNTER — Other Ambulatory Visit: Payer: Self-pay | Admitting: Internal Medicine

## 2022-08-07 ENCOUNTER — Ambulatory Visit: Payer: Self-pay | Admitting: Family Medicine

## 2022-08-07 ENCOUNTER — Ambulatory Visit: Payer: BC Managed Care – PPO | Admitting: Family Medicine

## 2022-08-07 ENCOUNTER — Encounter: Payer: Self-pay | Admitting: Cardiovascular Disease

## 2022-08-07 ENCOUNTER — Encounter: Payer: Self-pay | Admitting: Family Medicine

## 2022-08-07 VITALS — BP 123/85 | HR 91 | Temp 98.0°F | Ht 66.0 in | Wt 188.0 lb

## 2022-08-07 DIAGNOSIS — M25511 Pain in right shoulder: Secondary | ICD-10-CM

## 2022-08-07 NOTE — Patient Instructions (Addendum)
I thnk you have shoulder strain / likely tendon   Use ice (cold compress) on the painful area for 10 minutes whenever you can  Do the range of motion exercises   Oral nsaid  (naproxen)  or voltaren gel 1% over the counter   Avoid painful activities but do not stop moving the shoulder   Update if symptoms worsen Update if not significantly improved in 10-14 days

## 2022-08-07 NOTE — Progress Notes (Signed)
Subjective:    Patient ID: Lindsay Fry, female    DOB: Jan 14, 1961, 62 y.o.   MRN: [id number]  HPI  Wt Readings from Last 3 Encounters:  08/07/22 188 lb (85.3 kg)  05/24/22 194 lb 6.4 oz (88.2 kg)  05/21/22 194 lb 2 oz (88.1 kg)   30.34 kg/m  Vitals:   08/07/22 1223 08/07/22 1250  BP: (!) 144/90 123/85  Pulse: 91   Temp: 98 F (36.7 C)   SpO2: 96%    Pt presents for c/o arm injury right   10 days ago Reached into back seat of car  Hyperextended her shoulder - hurt immediately (did not pop) Not getting better   Does water exercise-made it worse  Bought a shoulder immobilizer-used briefly to keep arm from going above head in bed     Aleve helps a bit  Icy hot roll on  Heating pad as needed   Abduction hurts the most Also internal rotation     Had shoulder issue years ago  Not a tear No surgery  Got better with PT       Patient Active Problem List   Diagnosis Date Noted   Shoulder pain, right 08/07/2022   Ulcerative (chronic) pancolitis without complications (HCC) 04/29/2022   Stress reaction 10/29/2021   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   Elevated transaminase level 03/27/2020   Current use of proton pump inhibitor 03/19/2020   Hypokalemia 03/07/2020   Aortic atherosclerosis (HCC) 03/07/2020   MCI (mild cognitive impairment) 08/05/2019   Obesity (BMI 30-39.9) 04/23/2019   Poor balance 12/16/2018   Fatigue 12/16/2018   Migraine 11/12/2018   Cervical radiculopathy due to degenerative joint disease of spine 10/28/2017   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   Osteopenia 09/25/2016   Estrogen deficiency 05/30/2016   Need for hepatitis C screening test 03/19/2016   Carcinoma of overlapping sites of left breast in female, estrogen receptor positive (HCC) 08/21/2015   Chronic constipation 01/18/2015   NSTEMI (non-ST elevated myocardial infarction) (HCC) 09/06/2014   Atherosclerosis of native coronary artery of native  heart with angina pectoris (HCC) 09/06/2014   DM (diabetes mellitus) (HCC) 09/05/2014   History of breast cancer 06/02/2014   Sinus tachycardia 04/27/2014   ADD (attention deficit disorder) 11/19/2012   Low back pain 08/05/2012   Hypothyroid 05/18/2012   Chronic sinusitis 01/24/2012   Routine general medical examination at a health care facility 10/14/2010   POSTMENOPAUSAL STATUS 06/22/2008   Vitamin D deficiency 03/18/2008   BENIGN NEOPLASM OTH&UNSPEC SITE DIGESTIVE SYSTEM 01/09/2007   HIATAL HERNIA 01/09/2007   COLONIC POLYPS, ADENOMATOUS, HX OF 01/09/2007   Hyperlipidemia associated with type 2 diabetes mellitus (HCC) 01/07/2007   Bipolar I disorder, most recent episode depressed (HCC) 01/07/2007   Essential hypertension 01/07/2007   Allergic rhinitis 01/07/2007   ASTHMA 01/07/2007   GERD 01/07/2007   ACNE ROSACEA 01/07/2007   MIGRAINES, HX OF 01/07/2007   Fibromyalgia 10/30/2006   INSOMNIA 10/30/2006   Past Medical History:  Diagnosis Date   ADD (attention deficit disorder) 11/19/2012   Allergic rhinitis, cause unspecified    Anxiety state, unspecified    Arthritis    Asymptomatic postmenopausal status (age-related) (natural)    Atypical hyperplasia of left breast 2000   Atypical mole 03/16/2018   R paraspinal mid upper back, excision   Atypical mole 02/17/2018   R spinal upper back, moderate   Atypical mole 08/11/2017   L sternum, excision   Atypical mole 05/27/2017   L  post neck, moderate   Benign neoplasm of other and unspecified site of the digestive system    Bipolar disorder (HCC)    Breast cancer (HCC)    Left Breast 6mm invasive CA left uiq, dcis left uoq and a lot ADH, ALH in both breasts.   CAD (coronary artery disease)    a. 08/2014 NSTEMI/Cath: LM nl, LAD 50p, D1/D2 min irregs, LCX nl, OM1 40, LPDA nl, RI 99ost (2.39mm vessel->med Rx), RCA nl, AM 60, nl EF; b. 11/2016 MV: EF 74%, no ischemia (performed 2/2 dyspnea & new inf TWI).   Carcinoma of left breast  (HCC) 06/02/2014   Cataract    Chronic diastolic CHF (congestive heart failure) (HCC)    a. 08/2014 Echo: EF 60-65%, Gr 1 DD, nl LA.   Clotting disorder (HCC)    on blood thiner, Plavix   Cyst    on Achilles tendon   Depression    Diabetes mellitus (HCC)    frank   Dysuria    Edema    Family history of malignant neoplasm of gastrointestinal tract    Fibromyalgia    GERD (gastroesophageal reflux disease)    Glaucoma    pre glaucoma, 05/14/2018 pt. denies glaucoma   Headache    migraines   Hiatal hernia    History of alcoholism (HCC)    History of migraines    History of ovarian cyst    Hx of adenomatous colonic polyps    Insomnia, unspecified    Myocardial infarction (HCC) 09-04-14   OCD (obsessive compulsive disorder)    Osteopenia 09/25/2016   Femoral neck T -1.1  9/18   Other screening mammogram    Pure hypercholesterolemia    Rosacea    Subacute confusional state 11/19/2012   Tubular adenoma of colon 01/10/11   Ulcerative colitis, unspecified    Unspecified asthma(493.90)    Unspecified essential hypertension    Unspecified hypothyroidism    Unspecified vitamin D deficiency    Vertigo    Past Surgical History:  Procedure Laterality Date   APPENDECTOMY     AXILLARY SENTINEL NODE BIOPSY Left 05/23/2014   Procedure: AXILLARY SENTINEL NODE BIOPSY;  Surgeon: Kieth Brightly, MD;  Location: [facility];  Service: General;  Laterality: Left;   BREAST BIOPSY  08/1998   on tamoxifen, atypical hyperplasia   BREAST BIOPSY Left 04/2014   BREAST SURGERY Left 08/1998   lumpectomy/ Dr Okey Dupre   BREAST SURGERY Bilateral 05/23/2014   Mastectomy   BUNIONECTOMY Right    CARDIAC CATHETERIZATION N/A 09/05/2014   Procedure: Left Heart Cath and Coronary Angiography;  Surgeon: Iran Ouch, MD;  Location: [facility];  Service: Cardiovascular;  Laterality: N/A;   CARDIAC CATHETERIZATION  09/05/2014   CATARACT EXTRACTION W/ INTRAOCULAR LENS IMPLANT Bilateral     CESAREAN SECTION  1996/1997   placenta previa, gest DM, pre-eclampsia   CHOLECYSTECTOMY  1997   adhesions also   COLONOSCOPY  01/2001   Ulcerative colitis   COLONOSCOPY  04/2004   UC, polyp   COLONOSCOPY  12/2006   UC, no polyps   CORONARY STENT INTERVENTION N/A 04/06/2020   Procedure: CORONARY STENT INTERVENTION;  Surgeon: Yvonne Kendall, MD;  Location: [facility];  Service: Cardiovascular;  Laterality: N/A;   DEXA  04/1999 and 2010   normal   ESOPHAGOGASTRODUODENOSCOPY  09/2001   polyp   exercise stress test  11/2003   negative   FEMUR FRACTURE SURGERY Left    LEFT HEART CATH AND  CORONARY ANGIOGRAPHY N/A 04/06/2020   Procedure: LEFT HEART CATH AND CORONARY ANGIOGRAPHY;  Surgeon: Antonieta Iba, MD;  Location: [facility];  Service: Cardiovascular;  Laterality: N/A;   MASTECTOMY Bilateral    MECKEL DIVERTICULUM EXCISION  1999   NASAL SINUS SURGERY  05/1997   nuclear stress test  06/2007   negative   precancerous mole removed     RIGHT AND LEFT HEART CATH     with 1 stent placed in March 2022   SIMPLE MASTECTOMY WITH AXILLARY SENTINEL NODE BIOPSY Bilateral 05/23/2014   Procedure: Bilateral simple mastectomy, left sentinel node biopsy ;  Surgeon: Kieth Brightly, MD;  Location: [facility];  Service: General;  Laterality: Bilateral;   Sleep study  11/2007   no apnea, but did snore (done by [facility])   TONSILLECTOMY  1984   TUBAL LIGATION     WISDOM TOOTH EXTRACTION  1980's   Social History   Tobacco Use   Smoking status: Former    Current packs/day: 0.00    Average packs/day: 0.3 packs/day for 5.0 years (1.3 ttl pk-yrs)    Types: Cigarettes    Start date: 01/15/1988    Quit date: 01/14/1993    Years since quitting: 29.5   Smokeless tobacco: Never  Vaping Use   Vaping status: Never Used  Substance Use Topics   Alcohol use: No    Alcohol/week: 0.0 standard drinks of alcohol    Comment: Recovered ETOH   Drug use: No   Family History  Problem  Relation Age of Onset   Coronary artery disease Father    Colon cancer Father    Alzheimer's disease Father    Dementia Father    Colon polyps Father    Coronary artery disease Mother    Hypertension Mother    Osteoporosis Mother    Crohn's disease Mother        crohns colitis   Breast cancer Other        great aunts   Coronary artery disease Other        Uncle (also AAA)   Diabetes Other        remote family history   Stroke Cousin    Esophageal cancer Neg Hx    Rectal cancer Neg Hx    Stomach cancer Neg Hx    Allergies  Allergen Reactions   Ephedrine Other (See Comments)    Pt becomes hyper   Oxycodone Other (See Comments)    Panic Attack   Aspirin Other (See Comments)    REACTION: aggrivates colitis Tolerates 81 mg but unable to take doses >81 mg   Erythromycin Other (See Comments)    REACTION: GI upset   Nsaids     REACTION: aggrivate colitis   Other Other (See Comments)    Unable to take due to history of ulcerative colitis   Rosuvastatin Other (See Comments)    REACTION: increased lfts   Current Outpatient Medications on File Prior to Visit  Medication Sig Dispense Refill   acetaminophen (TYLENOL) 650 MG CR tablet Take 1,300 mg by mouth 2 (two) times daily.     albuterol (VENTOLIN HFA) 108 (90 Base) MCG/ACT inhaler INHALE 1 PUFF INTO THE LUNGS EVERY 4 HOURS AS NEEDED FOR WHEEZING     amantadine (SYMMETREL) 100 MG capsule Take 100 mg by mouth 2 (two) times daily.     Ascorbic Acid (VITAMIN C PO) Take 1,000 mg by mouth daily.     aspirin 81 MG chewable  tablet Chew 1 tablet (81 mg total) by mouth daily. 90 tablet 0   atorvastatin (LIPITOR) 40 MG tablet Take 40 mg by mouth daily.     B Complex Vitamins (VITAMIN B COMPLEX PO) Take 1 tablet by mouth every other day.     BAYER MICROLET LANCETS lancets USE AS DIRECTED TO CHECK BLOOD SUGAR TWICE DAILY 200 each 2   Blood Glucose Monitoring Suppl (CONTOUR NEXT MONITOR) w/Device KIT 1 each by Does not apply route in the  morning and at bedtime. Use Contour Next meter to check blood sugar twice daily. 1 kit 0   Calcium Carb-Cholecalciferol (CALTRATE 600+D3 PO) Take 1 tablet by mouth daily. 300 mg Vitamin D3     Co-Enzyme Q-10 100 MG CAPS Take 100 mg by mouth daily.     cyclobenzaprine (FLEXERIL) 5 MG tablet Take 1 tablet (5 mg total) by mouth every 8 (eight) hours as needed for muscle spasms. 20 tablet 0   dextroamphetamine (DEXEDRINE SPANSULE) 15 MG 24 hr capsule Take 15 mg by mouth as needed.     diazepam (VALIUM) 10 MG tablet Take 5-10 mg by mouth daily as needed for anxiety.     diphenhydrAMINE (BENADRYL) 25 MG tablet Take 50 mg by mouth at bedtime.     docusate sodium (COLACE) 100 MG capsule Take 100-300 mg by mouth daily as needed for mild constipation or moderate constipation.     Evolocumab (REPATHA SURECLICK) 140 MG/ML SOAJ Inject 140mg  every two weeks 2 mL 2   ezetimibe (ZETIA) 10 MG tablet TAKE 1 TABLET(10 MG) BY MOUTH DAILY 90 tablet 3   famotidine (PEPCID) 40 MG tablet Take 40 mg by mouth 2 (two) times daily. 30 min. Before meal and at bedtime     fluticasone (FLONASE) 50 MCG/ACT nasal spray Place 1 spray into both nostrils daily. 16 g 6   gabapentin (NEURONTIN) 300 MG capsule TAKE 1 CAPSULE BY MOUTH 3 TIMES DAILY 270 capsule 1   glucose blood (CONTOUR NEXT TEST) test strip USE AS DIRECTED. 100 each 5   insulin aspart (NOVOLOG FLEXPEN) 100 UNIT/ML FlexPen Use 10 to 15 units when blood sugars are more than 200 15 mL 1   insulin detemir (LEVEMIR FLEXPEN) 100 UNIT/ML FlexPen USE 60 UNITS SUBCUTANEOUSLY ONCE EVERY EVENING AS DIRECTED 15 mL 5   Insulin Pen Needle (B-D UF III MINI PEN NEEDLES) 31G X 5 MM MISC USE FOR INJECTIONS TWICE DAILY 150 each 3   JARDIANCE 25 MG TABS tablet TAKE 1 TABLET BY MOUTH ONCE DAILY 90 tablet 3   ketoconazole (NIZORAL) 2 % cream Apply to affected areas one to two times daily as needed. 30 g 11   KRILL OIL PO Take by mouth.     lamoTRIgine (LAMICTAL) 200 MG tablet Take 400 mg  by mouth daily.     latanoprost (XALATAN) 0.005 % ophthalmic solution Place 1 drop into both eyes 2 (two) times a week.      LEVOXYL 125 MCG tablet TAKE 2 TABLETS BY MOUTH ON TUES-SUNDAY BEFORE BREAKFAST AND 3 TABLETS ON MONDAYS 100 tablet 1   loperamide (IMODIUM A-D) 2 MG tablet Take 2 mg by mouth daily as needed for diarrhea or loose stools.     Melatonin 10 MG CAPS Take 10 mg by mouth at bedtime.     mesalamine (LIALDA) 1.2 g EC tablet TAKE 2 TABLETS BY MOUTH ONCE DAILY 180 tablet 1   metoprolol succinate (TOPROL-XL) 25 MG 24 hr tablet TAKE 1/2 TABLET BY MOUTH  ONCE DAILY AS NEEDED AT BEDTIME FOR SYSTOLIC BLOOD PRESSURE >100 45 tablet 0   Multiple Vitamin (MULTIVITAMIN) tablet Take 1 tablet by mouth daily. Woman 50 plus     nitroGLYCERIN (NITROSTAT) 0.4 MG SL tablet Place 1 tablet (0.4 mg total) under the tongue every 5 (five) minutes as needed for chest pain. 25 tablet 3   olmesartan (BENICAR) 5 MG tablet TAKE 1 TABLET BY MOUTH ONCE DAILY 90 tablet 0   ondansetron (ZOFRAN-ODT) 4 MG disintegrating tablet DISSOLVE 1 TABLET ON THE TONGUE EVERY 6 HOURS AS NEEDED FOR NAUSEA OR VOMIT 90 tablet 1   pantoprazole (PROTONIX) 40 MG tablet TAKE 1 TABLET BY MOUTH TWICE DAILY 180 tablet 1   polyethylene glycol (MIRALAX / GLYCOLAX) 17 g packet Take 17 g by mouth 3 (three) times daily as needed for moderate constipation, mild constipation or severe constipation. In water or beverage     Probiotic Product (CULTURELLE PROBIOTICS PO) Take 1 capsule by mouth daily. For woman     QUEtiapine (SEROQUEL XR) 400 MG 24 hr tablet Take 800 mg by mouth at bedtime.     simethicone (MYLICON) 80 MG chewable tablet Chew 80 mg by mouth 4 (four) times daily.     Tavaborole (KERYDIN) 5 % SOLN Apply 1 application topically at bedtime. Apply to affected area of left great toenail 10 mL 6   tirzepatide (MOUNJARO) 10 MG/0.5ML Pen Inject 10 mg into the skin once a week. 2 mL 5   valACYclovir (VALTREX) 1000 MG tablet TAKE 1 TABLET BY  MOUTH ONCE DAILY, take one tablet po bid x 5 days prn outbreak 90 tablet 3   VRAYLAR capsule Take 3 mg by mouth at bedtime.     zaleplon (SONATA) 10 MG capsule Take 10-20 mg by mouth at bedtime.     No current facility-administered medications on file prior to visit.    Review of Systems  Constitutional:  Negative for activity change, appetite change, fatigue, fever and unexpected weight change.       Losing weight with mounjaro  HENT:  Negative for congestion, ear pain, rhinorrhea, sinus pressure and sore throat.   Eyes:  Negative for pain, redness and visual disturbance.  Respiratory:  Negative for cough, shortness of breath and wheezing.   Cardiovascular:  Negative for chest pain and palpitations.  Gastrointestinal:  Negative for abdominal pain, blood in stool, constipation and diarrhea.  Endocrine: Negative for polydipsia and polyuria.  Genitourinary:  Negative for dysuria, frequency and urgency.  Musculoskeletal:  Positive for arthralgias. Negative for back pain and myalgias.  Skin:  Negative for pallor and rash.  Allergic/Immunologic: Negative for environmental allergies.  Neurological:  Negative for dizziness, syncope, weakness, numbness and headaches.  Hematological:  Negative for adenopathy. Does not bruise/bleed easily.  Psychiatric/Behavioral:  Negative for decreased concentration and dysphoric mood. The patient is not nervous/anxious.        Objective:   Physical Exam Constitutional:      General: She is not in acute distress.    Appearance: Normal appearance. She is obese. She is not ill-appearing or diaphoretic.  HENT:     Head: Normocephalic and atraumatic.  Cardiovascular:     Rate and Rhythm: Normal rate and regular rhythm.  Musculoskeletal:     Comments: Shoulder right: No deformity/swelling/warmth or erythema  No crepitus  No obvious effusion  Abduction - just over 90 deg with pain  Hawking test positive for ant/lat shoulder pain  Neer test :  equivicol Internal rotation -causes most  pain  External rotation less limited than int rot  Tenderness over acromion and deltoid area  Normal grip and hand dexterity     Neurological:     Mental Status: She is alert.     Cranial Nerves: No cranial nerve deficit.     Sensory: No sensory deficit.     Motor: No weakness.  Psychiatric:        Mood and Affect: Mood normal.           Assessment & Plan:   Problem List Items Addressed This Visit       Other   Shoulder pain, right - Primary    Right shoulder strain/likely rotator cuff after injury reaching into her back seat  No swelling/ bruising  Some limited rom (abduction and int rot) due to pain  Reassuring exam Encouraged use of cold compress as often as possible and passive rom exercises to prevent frozen shoulder  Has immobilizer to use only for brief periods of time - recommend as little as possible  Handout given-exercises Short term oral nsaid / naproxen (or voltaren gel if not tolerated)  Update if not starting to improve in 10-14 d or if worsening at any time Consider sport med eval/ imaging if needed

## 2022-08-07 NOTE — Assessment & Plan Note (Signed)
Right shoulder strain/likely rotator cuff after injury reaching into her back seat  No swelling/ bruising  Some limited rom (abduction and int rot) due to pain  Reassuring exam Encouraged use of cold compress as often as possible and passive rom exercises to prevent frozen shoulder  Has immobilizer to use only for brief periods of time - recommend as little as possible  Handout given-exercises Short term oral nsaid / naproxen (or voltaren gel if not tolerated)  Update if not starting to improve in 10-14 d or if worsening at any time Consider sport med eval/ imaging if needed

## 2022-08-08 ENCOUNTER — Other Ambulatory Visit: Payer: Self-pay | Admitting: Cardiovascular Disease

## 2022-08-09 ENCOUNTER — Other Ambulatory Visit: Payer: Self-pay | Admitting: Endocrinology

## 2022-08-09 DIAGNOSIS — E782 Mixed hyperlipidemia: Secondary | ICD-10-CM

## 2022-08-09 MED ORDER — ATORVASTATIN CALCIUM 40 MG PO TABS: 40.0000 mg | ORAL_TABLET | Freq: Every day | ORAL | 0 refills | Status: DC

## 2022-08-09 NOTE — Telephone Encounter (Signed)
Could you verify dose patient should be taking?

## 2022-08-09 NOTE — Telephone Encounter (Addendum)
There is an open 'patient message' from 08/07/22 sent to MD and nurse requesting confirmation of dosing.  Atorvastatin dose was previously reduced to 40 mg every day.

## 2022-08-13 ENCOUNTER — Encounter: Payer: Self-pay | Admitting: Family Medicine

## 2022-08-13 ENCOUNTER — Ambulatory Visit: Payer: BC Managed Care – PPO | Admitting: Family Medicine

## 2022-08-13 VITALS — BP 110/70 | HR 84 | Temp 98.0°F | Ht 66.0 in | Wt 186.0 lb

## 2022-08-13 DIAGNOSIS — M545 Low back pain, unspecified: Secondary | ICD-10-CM | POA: Diagnosis not present

## 2022-08-13 MED ORDER — CYCLOBENZAPRINE HCL 5 MG PO TABS: 5.0000 mg | ORAL_TABLET | Freq: Three times a day (TID) | ORAL | 1 refills | Status: DC | PRN

## 2022-08-13 NOTE — Progress Notes (Signed)
Subjective:    Patient ID: Lindsay Fry, female    DOB: 27-Mar-1960, 62 y.o.   MRN: [id number]  HPI  Wt Readings from Last 3 Encounters:  08/13/22 186 lb (84.4 kg)  08/07/22 188 lb (85.3 kg)  05/24/22 194 lb 6.4 oz (88.2 kg)   30.02 kg/m  Vitals:   08/13/22 1131  BP: 110/70  Pulse: 84  Temp: 98 F (36.7 C)  SpO2: 98%   Since last visit shoulder is improved   Left lower back spasms since Sunday  Stood up from bed and felt a catch in her back / this time on left (is usually right)  ? What she did  Taking aleve Tramadol -left over  Flexeril - has one left    Had LS film in 2023 Narrative & Impression  CLINICAL DATA:  Right lower back pain.   EXAM: LUMBAR SPINE - COMPLETE 4+ VIEW   COMPARISON:  August 05, 2016.   FINDINGS: There is no evidence of lumbar spine fracture. Alignment is normal. Mild to moderate degenerative disc disease is noted at L1-2 and L2-3 with anterior osteophyte formation.   IMPRESSION: Mild to moderate multilevel degenerative disc disease. No acute abnormality seen.     Hurting on left  Does not shoot down leg  Worse standing from sitting / getting out of bed and car (when the seat is low)   Best position in bed is on left side   Scoliosis makes one hip higher   Using some heat   No neuro symptoms     Patient Active Problem List   Diagnosis Date Noted   Shoulder pain, right 08/07/2022   Ulcerative (chronic) pancolitis without complications (HCC) 04/29/2022   Stress reaction 10/29/2021   Bipolar 2 disorder (HCC) 04/17/2021   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   Elevated transaminase level 03/27/2020   Current use of proton pump inhibitor 03/19/2020   Hypokalemia 03/07/2020   Aortic atherosclerosis (HCC) 03/07/2020   MCI (mild cognitive impairment) 08/05/2019   Obesity (BMI 30-39.9) 04/23/2019   Poor balance 12/16/2018   Fatigue 12/16/2018   Migraine 11/12/2018   Cervical radiculopathy  due to degenerative joint disease of spine 10/28/2017   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   Osteopenia 09/25/2016   Estrogen deficiency 05/30/2016   Carcinoma of overlapping sites of left breast in female, estrogen receptor positive (HCC) 08/21/2015   Chronic constipation 01/18/2015   NSTEMI (non-ST elevated myocardial infarction) (HCC) 09/06/2014   Atherosclerosis of native coronary artery of native heart with angina pectoris (HCC) 09/06/2014   DM (diabetes mellitus) (HCC) 09/05/2014   History of breast cancer 06/02/2014   ADD (attention deficit disorder) 11/19/2012   Low back pain 08/05/2012   Hypothyroid 05/18/2012   Chronic sinusitis 01/24/2012   POSTMENOPAUSAL STATUS 06/22/2008   Vitamin D deficiency 03/18/2008   BENIGN NEOPLASM OTH&UNSPEC SITE DIGESTIVE SYSTEM 01/09/2007   HIATAL HERNIA 01/09/2007   COLONIC POLYPS, ADENOMATOUS, HX OF 01/09/2007   Hyperlipidemia associated with type 2 diabetes mellitus (HCC) 01/07/2007   Essential hypertension 01/07/2007   Allergic rhinitis 01/07/2007   ASTHMA 01/07/2007   GERD 01/07/2007   ACNE ROSACEA 01/07/2007   MIGRAINES, HX OF 01/07/2007   Fibromyalgia 10/30/2006   INSOMNIA 10/30/2006   Past Medical History:  Diagnosis Date   ADD (attention deficit disorder) 11/19/2012   Allergic rhinitis, cause unspecified    Anxiety state, unspecified    Arthritis    Asymptomatic postmenopausal status (age-related) (natural)    Atypical hyperplasia of  left breast 2000   Atypical mole 03/16/2018   R paraspinal mid upper back, excision   Atypical mole 02/17/2018   R spinal upper back, moderate   Atypical mole 08/11/2017   L sternum, excision   Atypical mole 05/27/2017   L post neck, moderate   Benign neoplasm of other and unspecified site of the digestive system    Bipolar disorder (HCC)    Breast cancer (HCC)    Left Breast 6mm invasive CA left uiq, dcis left uoq and a lot ADH, ALH in both breasts.   CAD (coronary artery disease)    a. 08/2014  NSTEMI/Cath: LM nl, LAD 50p, D1/D2 min irregs, LCX nl, OM1 40, LPDA nl, RI 99ost (2.49mm vessel->med Rx), RCA nl, AM 60, nl EF; b. 11/2016 MV: EF 74%, no ischemia (performed 2/2 dyspnea & new inf TWI).   Carcinoma of left breast (HCC) 06/02/2014   Cataract    Chronic diastolic CHF (congestive heart failure) (HCC)    a. 08/2014 Echo: EF 60-65%, Gr 1 DD, nl LA.   Clotting disorder (HCC)    on blood thiner, Plavix   Cyst    on Achilles tendon   Depression    Diabetes mellitus (HCC)    frank   Dysuria    Edema    Family history of malignant neoplasm of gastrointestinal tract    Fibromyalgia    GERD (gastroesophageal reflux disease)    Glaucoma    pre glaucoma, 05/14/2018 pt. denies glaucoma   Headache    migraines   Hiatal hernia    History of alcoholism (HCC)    History of migraines    History of ovarian cyst    Hx of adenomatous colonic polyps    Insomnia, unspecified    Myocardial infarction (HCC) 09-04-14   OCD (obsessive compulsive disorder)    Osteopenia 09/25/2016   Femoral neck T -1.1  9/18   Other screening mammogram    Pure hypercholesterolemia    Rosacea    Subacute confusional state 11/19/2012   Tubular adenoma of colon 01/10/11   Ulcerative colitis, unspecified    Unspecified asthma(493.90)    Unspecified essential hypertension    Unspecified hypothyroidism    Unspecified vitamin D deficiency    Vertigo    Past Surgical History:  Procedure Laterality Date   APPENDECTOMY     AXILLARY SENTINEL NODE BIOPSY Left 05/23/2014   Procedure: AXILLARY SENTINEL NODE BIOPSY;  Surgeon: Kieth Brightly, MD;  Location: [facility];  Service: General;  Laterality: Left;   BREAST BIOPSY  08/1998   on tamoxifen, atypical hyperplasia   BREAST BIOPSY Left 04/2014   BREAST SURGERY Left 08/1998   lumpectomy/ Dr Okey Dupre   BREAST SURGERY Bilateral 05/23/2014   Mastectomy   BUNIONECTOMY Right    CARDIAC CATHETERIZATION N/A 09/05/2014   Procedure: Left Heart Cath and Coronary  Angiography;  Surgeon: Iran Ouch, MD;  Location: [facility];  Service: Cardiovascular;  Laterality: N/A;   CARDIAC CATHETERIZATION  09/05/2014   CATARACT EXTRACTION W/ INTRAOCULAR LENS IMPLANT Bilateral    CESAREAN SECTION  1996/1997   placenta previa, gest DM, pre-eclampsia   CHOLECYSTECTOMY  1997   adhesions also   COLONOSCOPY  01/2001   Ulcerative colitis   COLONOSCOPY  04/2004   UC, polyp   COLONOSCOPY  12/2006   UC, no polyps   CORONARY STENT INTERVENTION N/A 04/06/2020   Procedure: CORONARY STENT INTERVENTION;  Surgeon: Yvonne Kendall, MD;  Location: [facility];  Service: Cardiovascular;  Laterality: N/A;   DEXA  04/1999 and 2010   normal   ESOPHAGOGASTRODUODENOSCOPY  09/2001   polyp   exercise stress test  11/2003   negative   FEMUR FRACTURE SURGERY Left    LEFT HEART CATH AND CORONARY ANGIOGRAPHY N/A 04/06/2020   Procedure: LEFT HEART CATH AND CORONARY ANGIOGRAPHY;  Surgeon: Antonieta Iba, MD;  Location: [facility];  Service: Cardiovascular;  Laterality: N/A;   MASTECTOMY Bilateral    MECKEL DIVERTICULUM EXCISION  1999   NASAL SINUS SURGERY  05/1997   nuclear stress test  06/2007   negative   precancerous mole removed     RIGHT AND LEFT HEART CATH     with 1 stent placed in March 2022   SIMPLE MASTECTOMY WITH AXILLARY SENTINEL NODE BIOPSY Bilateral 05/23/2014   Procedure: Bilateral simple mastectomy, left sentinel node biopsy ;  Surgeon: Kieth Brightly, MD;  Location: [facility];  Service: General;  Laterality: Bilateral;   Sleep study  11/2007   no apnea, but did snore (done by [facility])   TONSILLECTOMY  1984   TUBAL LIGATION     WISDOM TOOTH EXTRACTION  1980's   Social History   Tobacco Use   Smoking status: Former    Current packs/day: 0.00    Average packs/day: 0.3 packs/day for 5.0 years (1.3 ttl pk-yrs)    Types: Cigarettes    Start date: 01/15/1988    Quit date: 01/14/1993    Years since quitting: 29.5    Smokeless tobacco: Never  Vaping Use   Vaping status: Never Used  Substance Use Topics   Alcohol use: No    Alcohol/week: 0.0 standard drinks of alcohol    Comment: Recovered ETOH   Drug use: No   Family History  Problem Relation Age of Onset   Coronary artery disease Father    Colon cancer Father    Alzheimer's disease Father    Dementia Father    Colon polyps Father    Coronary artery disease Mother    Hypertension Mother    Osteoporosis Mother    Crohn's disease Mother        crohns colitis   Breast cancer Other        great aunts   Coronary artery disease Other        Uncle (also AAA)   Diabetes Other        remote family history   Stroke Cousin    Esophageal cancer Neg Hx    Rectal cancer Neg Hx    Stomach cancer Neg Hx    Allergies  Allergen Reactions   Ephedrine Other (See Comments)    Pt becomes hyper   Oxycodone Other (See Comments)    Panic Attack   Aspirin Other (See Comments)    REACTION: aggrivates colitis Tolerates 81 mg but unable to take doses >81 mg   Erythromycin Other (See Comments)    REACTION: GI upset   Nsaids     REACTION: aggrivate colitis   Other Other (See Comments)    Unable to take due to history of ulcerative colitis   Rosuvastatin Other (See Comments)    REACTION: increased lfts   Current Outpatient Medications on File Prior to Visit  Medication Sig Dispense Refill   acetaminophen (TYLENOL) 650 MG CR tablet Take 1,300 mg by mouth 2 (two) times daily.     albuterol (VENTOLIN HFA) 108 (90 Base) MCG/ACT inhaler INHALE 1 PUFF INTO THE LUNGS EVERY 4  HOURS AS NEEDED FOR WHEEZING     amantadine (SYMMETREL) 100 MG capsule Take 100 mg by mouth 2 (two) times daily.     Ascorbic Acid (VITAMIN C PO) Take 1,000 mg by mouth daily.     aspirin 81 MG chewable tablet Chew 1 tablet (81 mg total) by mouth daily. 90 tablet 0   atorvastatin (LIPITOR) 40 MG tablet Take 1 tablet (40 mg total) by mouth daily. 90 tablet 0   B Complex Vitamins (VITAMIN B  COMPLEX PO) Take 1 tablet by mouth every other day.     BAYER MICROLET LANCETS lancets USE AS DIRECTED TO CHECK BLOOD SUGAR TWICE DAILY 200 each 2   Blood Glucose Monitoring Suppl (CONTOUR NEXT MONITOR) w/Device KIT 1 each by Does not apply route in the morning and at bedtime. Use Contour Next meter to check blood sugar twice daily. 1 kit 0   Calcium Carb-Cholecalciferol (CALTRATE 600+D3 PO) Take 1 tablet by mouth daily. 300 mg Vitamin D3     Co-Enzyme Q-10 100 MG CAPS Take 100 mg by mouth daily.     dextroamphetamine (DEXEDRINE SPANSULE) 15 MG 24 hr capsule Take 15 mg by mouth as needed.     diazepam (VALIUM) 10 MG tablet Take 5-10 mg by mouth daily as needed for anxiety.     docusate sodium (COLACE) 100 MG capsule Take 100-300 mg by mouth daily as needed for mild constipation or moderate constipation.     Evolocumab (REPATHA SURECLICK) 140 MG/ML SOAJ INJECT 140MG  SUBCUTANEOUSLY EVERY 2 WEEKS 2 mL 2   ezetimibe (ZETIA) 10 MG tablet TAKE 1 TABLET(10 MG) BY MOUTH DAILY 90 tablet 3   famotidine (PEPCID) 40 MG tablet Take 40 mg by mouth 2 (two) times daily. 30 min. Before meal and at bedtime     fluticasone (FLONASE) 50 MCG/ACT nasal spray Place 1 spray into both nostrils daily. 16 g 6   gabapentin (NEURONTIN) 300 MG capsule TAKE 1 CAPSULE BY MOUTH 3 TIMES DAILY 270 capsule 1   glucose blood (CONTOUR NEXT TEST) test strip USE AS DIRECTED. 100 each 5   insulin aspart (NOVOLOG FLEXPEN) 100 UNIT/ML FlexPen Use 10 to 15 units when blood sugars are more than 200 15 mL 1   insulin detemir (LEVEMIR FLEXPEN) 100 UNIT/ML FlexPen USE 60 UNITS SUBCUTANEOUSLY ONCE EVERY EVENING AS DIRECTED 15 mL 5   Insulin Pen Needle (B-D UF III MINI PEN NEEDLES) 31G X 5 MM MISC USE FOR INJECTIONS TWICE DAILY 150 each 3   JARDIANCE 25 MG TABS tablet TAKE 1 TABLET BY MOUTH ONCE DAILY 90 tablet 3   ketoconazole (NIZORAL) 2 % cream Apply to affected areas one to two times daily as needed. 30 g 11   KRILL OIL PO Take by mouth.      lamoTRIgine (LAMICTAL) 200 MG tablet Take 400 mg by mouth daily.     latanoprost (XALATAN) 0.005 % ophthalmic solution Place 1 drop into both eyes 2 (two) times a week.      LEVOXYL 125 MCG tablet TAKE 2 TABLETS BY MOUTH ON TUES-SUNDAY BEFORE BREAKFAST AND 3 TABLETS ON MONDAYS 100 tablet 1   loperamide (IMODIUM A-D) 2 MG tablet Take 2 mg by mouth daily as needed for diarrhea or loose stools.     Melatonin 10 MG CAPS Take 10 mg by mouth at bedtime.     mesalamine (LIALDA) 1.2 g EC tablet TAKE 2 TABLETS BY MOUTH ONCE DAILY 180 tablet 1   metoprolol succinate (TOPROL-XL) 25 MG  24 hr tablet TAKE 1/2 TABLET BY MOUTH ONCE DAILY AS NEEDED AT BEDTIME FOR SYSTOLIC BLOOD PRESSURE >100 45 tablet 0   Multiple Vitamin (MULTIVITAMIN) tablet Take 1 tablet by mouth daily. Woman 50 plus     nitroGLYCERIN (NITROSTAT) 0.4 MG SL tablet Place 1 tablet (0.4 mg total) under the tongue every 5 (five) minutes as needed for chest pain. 25 tablet 3   olmesartan (BENICAR) 5 MG tablet TAKE 1 TABLET BY MOUTH ONCE DAILY 90 tablet 0   ondansetron (ZOFRAN-ODT) 4 MG disintegrating tablet DISSOLVE 1 TABLET ON THE TONGUE EVERY 6 HOURS AS NEEDED FOR NAUSEA OR VOMIT 90 tablet 1   pantoprazole (PROTONIX) 40 MG tablet TAKE 1 TABLET BY MOUTH TWICE DAILY 180 tablet 1   polyethylene glycol (MIRALAX / GLYCOLAX) 17 g packet Take 17 g by mouth 3 (three) times daily as needed for moderate constipation, mild constipation or severe constipation. In water or beverage     Probiotic Product (CULTURELLE PROBIOTICS PO) Take 1 capsule by mouth daily. For woman     QUEtiapine (SEROQUEL XR) 400 MG 24 hr tablet Take 800 mg by mouth at bedtime.     simethicone (MYLICON) 80 MG chewable tablet Chew 80 mg by mouth 4 (four) times daily.     Tavaborole (KERYDIN) 5 % SOLN Apply 1 application topically at bedtime. Apply to affected area of left great toenail 10 mL 6   tirzepatide (MOUNJARO) 10 MG/0.5ML Pen Inject 10 mg into the skin once a week. 2 mL 5    valACYclovir (VALTREX) 1000 MG tablet TAKE 1 TABLET BY MOUTH ONCE DAILY, take one tablet po bid x 5 days prn outbreak 90 tablet 3   VRAYLAR capsule Take 3 mg by mouth at bedtime.     zaleplon (SONATA) 10 MG capsule Take 10-20 mg by mouth at bedtime.     No current facility-administered medications on file prior to visit.    Review of Systems  Constitutional:  Negative for activity change, appetite change, fatigue, fever and unexpected weight change.  HENT:  Negative for congestion, ear pain, rhinorrhea, sinus pressure and sore throat.   Eyes:  Negative for pain, redness and visual disturbance.  Respiratory:  Negative for cough, shortness of breath and wheezing.   Cardiovascular:  Negative for chest pain and palpitations.  Gastrointestinal:  Negative for abdominal pain, blood in stool, constipation and diarrhea.  Endocrine: Negative for polydipsia and polyuria.  Genitourinary:  Negative for dysuria, frequency and urgency.  Musculoskeletal:  Positive for arthralgias and back pain. Negative for myalgias.  Skin:  Negative for pallor and rash.  Allergic/Immunologic: Negative for environmental allergies.  Neurological:  Negative for dizziness, syncope and headaches.  Hematological:  Negative for adenopathy. Does not bruise/bleed easily.  Psychiatric/Behavioral:  Negative for decreased concentration and dysphoric mood. The patient is not nervous/anxious.        Objective:   Physical Exam Constitutional:      General: She is not in acute distress.    Appearance: Normal appearance. She is well-developed. She is obese. She is not ill-appearing or diaphoretic.  HENT:     Head: Normocephalic and atraumatic.  Eyes:     General: No scleral icterus.    Conjunctiva/sclera: Conjunctivae normal.     Pupils: Pupils are equal, round, and reactive to light.  Cardiovascular:     Rate and Rhythm: Normal rate and regular rhythm.  Pulmonary:     Effort: Pulmonary effort is normal.     Breath sounds:  Normal  breath sounds. No wheezing or rales.  Abdominal:     General: Bowel sounds are normal. There is no distension.     Palpations: Abdomen is soft.     Tenderness: There is no abdominal tenderness.  Musculoskeletal:        General: Tenderness present.     Cervical back: Normal range of motion and neck supple.     Lumbar back: Spasms and tenderness present. No edema or bony tenderness. Decreased range of motion.     Comments: Thoracolumbar scoliosis baseline No bony tenderness  Tender in left lumbar musculature with spasm  Pain worse when rising from sit   Flex- 30 deg Ext 5 deg with pain  Pain with left lateral bend   Lymphadenopathy:     Cervical: No cervical adenopathy.  Skin:    General: Skin is warm and dry.     Coloration: Skin is not pale.     Findings: No erythema or rash.     Comments: No new rash  Neurological:     Mental Status: She is alert.     Cranial Nerves: No cranial nerve deficit.     Sensory: No sensory deficit.     Motor: No weakness, atrophy or abnormal muscle tone.     Coordination: Coordination normal.     Deep Tendon Reflexes: Reflexes are normal and symmetric. Reflexes normal.     Comments: Negative SLR  Psychiatric:        Mood and Affect: Mood normal.           Assessment & Plan:   Problem List Items Addressed This Visit       Other   Low back pain - Primary    Recurrent  This time on left /occurred when getting out of bed  No neuro changes Reassuring exam/ some spasm and limited rom due to pain  Given handout- back strain rehab Old xray from 03/2021 notes deg changes  Consider PT if needed later Flexeril refilled/ use with caution of sedation  Update if not starting to improve in a week or if worsening  Call back and Er precautions noted in detail today        Relevant Medications   cyclobenzaprine (FLEXERIL) 5 MG tablet

## 2022-08-13 NOTE — Patient Instructions (Addendum)
Keep doing water exercise   Use flexeril as needed  Heat is good for 10 minutes at time Salon pas patches are ok   Try the stretches in handout   If not improving we can get you to PT   Take care of yourself  Update if not starting to improve in a week or if worsening

## 2022-08-13 NOTE — Assessment & Plan Note (Addendum)
Recurrent  This time on left /occurred when getting out of bed  No neuro changes Reassuring exam/ some spasm and limited rom due to pain  Given handout- back strain rehab Old xray from 03/2021 notes deg changes  Consider PT if needed later Flexeril refilled/ use with caution of sedation  Update if not starting to improve in a week or if worsening  Call back and Er precautions noted in detail today

## 2022-08-14 ENCOUNTER — Encounter: Payer: Self-pay | Admitting: Family Medicine

## 2022-08-15 ENCOUNTER — Encounter: Payer: Self-pay | Admitting: Endocrinology

## 2022-08-20 NOTE — Progress Notes (Unsigned)
     T. , MD, CAQ Sports Medicine [facility] at [facility] [address] [place], [address]  Phone: [phone]  FAX: [phone]  Lindsay Fry - 62 y.o. female  MRN [id number]  Date of Birth: July 06, 1960  Date: 08/21/2022  PCP: Judy Pimple, MD  Referral: Judy Pimple, MD  No chief complaint on file.  Subjective:   Lindsay Fry is a 62 y.o. very pleasant female patient with There is no height or weight on file to calculate BMI. who presents with the following:  The patient presents with a new complaint of an acute shoulder injury and right-sided acute shoulder pain.  This occurred roughly a month or so ago.  -She initially saw Dr. Milinda Antis on August 07, 2022.  At that point she was concern for possible rotator cuff strain, and had her do some basic motion and oral NSAIDs.      Review of Systems is noted in the HPI, as appropriate  Objective:   LMP 08/23/2006 (Approximate) Comment: tubal ligation  GEN: No acute distress; alert,appropriate. PULM: Breathing comfortably in no respiratory distress PSYCH: Normally interactive.   Laboratory and Imaging Data:  Assessment and Plan:   ***

## 2022-08-21 ENCOUNTER — Encounter: Payer: Self-pay | Admitting: Family Medicine

## 2022-08-21 ENCOUNTER — Ambulatory Visit: Payer: BC Managed Care – PPO | Admitting: Endocrinology

## 2022-08-21 ENCOUNTER — Encounter: Payer: Self-pay | Admitting: Endocrinology

## 2022-08-21 ENCOUNTER — Ambulatory Visit: Payer: BC Managed Care – PPO | Admitting: Family Medicine

## 2022-08-21 VITALS — BP 108/68 | HR 77 | Ht 66.0 in | Wt 187.0 lb

## 2022-08-21 VITALS — BP 130/90 | HR 84 | Temp 97.2°F | Ht 66.0 in | Wt 188.0 lb

## 2022-08-21 DIAGNOSIS — E119 Type 2 diabetes mellitus without complications: Secondary | ICD-10-CM

## 2022-08-21 DIAGNOSIS — E559 Vitamin D deficiency, unspecified: Secondary | ICD-10-CM | POA: Diagnosis not present

## 2022-08-21 DIAGNOSIS — S46011A Strain of muscle(s) and tendon(s) of the rotator cuff of right shoulder, initial encounter: Secondary | ICD-10-CM

## 2022-08-21 DIAGNOSIS — E1165 Type 2 diabetes mellitus with hyperglycemia: Secondary | ICD-10-CM | POA: Diagnosis not present

## 2022-08-21 DIAGNOSIS — E782 Mixed hyperlipidemia: Secondary | ICD-10-CM

## 2022-08-21 DIAGNOSIS — Z794 Long term (current) use of insulin: Secondary | ICD-10-CM

## 2022-08-21 DIAGNOSIS — E1169 Type 2 diabetes mellitus with other specified complication: Secondary | ICD-10-CM

## 2022-08-21 DIAGNOSIS — M25511 Pain in right shoulder: Secondary | ICD-10-CM | POA: Diagnosis not present

## 2022-08-21 DIAGNOSIS — E890 Postprocedural hypothyroidism: Secondary | ICD-10-CM | POA: Diagnosis not present

## 2022-08-21 DIAGNOSIS — E669 Obesity, unspecified: Secondary | ICD-10-CM

## 2022-08-21 DIAGNOSIS — E7800 Pure hypercholesterolemia, unspecified: Secondary | ICD-10-CM

## 2022-08-21 LAB — LIPID PANEL
Cholesterol: 80 mg/dL (ref 0–200)
HDL: 50 mg/dL (ref >39.00)
LDL Cholesterol: 14 mg/dL (ref 0–99)
NonHDL: 29.79
Total CHOL/HDL Ratio: 2
Triglycerides: 81 mg/dL (ref 0.0–149.0)
VLDL: 16.2 mg/dL (ref 0.0–40.0)

## 2022-08-21 LAB — COMPREHENSIVE METABOLIC PANEL
ALT: 20 U/L (ref 0–35)
AST: 20 U/L (ref 0–37)
Albumin: 4.5 g/dL (ref 3.5–5.2)
Alkaline Phosphatase: 67 U/L (ref 39–117)
BUN: 13 mg/dL (ref 6–23)
CO2: 29 mEq/L (ref 19–32)
Calcium: 9.2 mg/dL (ref 8.4–10.5)
Chloride: 104 mEq/L (ref 96–112)
Creatinine, Ser: 0.9 mg/dL (ref 0.40–1.20)
GFR: 67.77 mL/min (ref >60.00)
Glucose, Bld: 62 mg/dL — ABNORMAL LOW (ref 70–99)
Potassium: 4.3 mEq/L (ref 3.5–5.1)
Sodium: 140 mEq/L (ref 135–145)
Total Bilirubin: 0.4 mg/dL (ref 0.2–1.2)
Total Protein: 6.5 g/dL (ref 6.0–8.3)

## 2022-08-21 LAB — T4, FREE: Free T4: 0.92 ng/dL (ref 0.60–1.60)

## 2022-08-21 LAB — POCT GLYCOSYLATED HEMOGLOBIN (HGB A1C): Hemoglobin A1C: 5.1 % (ref 4.0–5.6)

## 2022-08-21 LAB — VITAMIN D 25 HYDROXY (VIT D DEFICIENCY, FRACTURES): VITD: 55.94 ng/mL (ref 30.00–100.00)

## 2022-08-21 LAB — TSH: TSH: 0.41 u[IU]/mL (ref 0.35–5.50)

## 2022-08-21 MED ORDER — PREDNISONE 20 MG PO TABS: ORAL_TABLET | ORAL | 0 refills | Status: DC

## 2022-08-21 NOTE — Patient Instructions (Addendum)
Reduce Levemir to 50 units and if morning sugars are still below 80 reduce another 10 units Check blood sugar more often after dinner Consistent exercise For now continue to stay on 10 mg Mounjaro

## 2022-08-21 NOTE — Telephone Encounter (Signed)
Called and spoke with patient she is going come by the office to pick meter . Will leave meter with front staff.

## 2022-08-21 NOTE — Telephone Encounter (Signed)
Pt sent a my chart my regarding a change in the dose of her insulin  Levemir ,  want to confirm the dose change before sending in a new rx to her pharmacy please advise.

## 2022-08-21 NOTE — Progress Notes (Signed)
Patient ID: Lindsay Fry, female   DOB: 1960-08-19, 62 y.o.   MRN: [id number]   Reason for Appointment:   followup visit  History of Present Illness:  DIABETES:  Prior history: With her high A1c of 6.7 in 03/2014 she had an abnormal glucose tolerance test indicating diabetes and 1 hour glucose of 300 She was started on metformin; however even with 1500 mg of metformin ER  blood sugars were not controlled especially fasting Subsequently with taking Janumet XR 100/1000 daily her blood sugars were much better Because of her weight gain and A1c going up to 7.3 along with occasional readings over 200 she was started on Victoza in 11/16 She has had progressive hyperglycemia previously and A1c was up to 8.6% in 4/17  She was started on Levemir insulin in 08/2015 because of progressive rise in blood sugars and inability to control with 3 other agents   Non-insulin hypoglycemic drugs: Mounjaro 10 weekly, metformin ER 2000 mg daily, Jardiance 25 mg daily   INSULIN regimen: Levemir 60 units at bedtime  A1c is now 5.1 compared to 5.2   Current management, blood sugar patterns and problems identified: She has been taking Mounjaro 10 mg since after her last visit in April More recently her blood sugars appear to be low normal fasting without any hypoglycemia As usual does not check her sugars after meals Because of other issues she has not been regular with exercise and only in the last few weeks Has not had any labs to evaluate other conditions and renal function She appears to have lost was on 10 pounds since her last visit However she thinks that in the last couple of weeks she has had more hunger but is likely also more stressed No side effects with Mounjaro or Jardiance   GLUCOSE readings from download of Contour meter  PRE-MEAL Fasting Lunch Dinner Bedtime Overall  Glucose range: 75-98   109   Mean/median:     85  Previously:  PRE-MEAL Fasting Lunch Dinner Bedtime  Overall  Glucose range: 89-150   120   Mean/median:        POST-MEAL PC Breakfast PC Lunch PC Dinner  Glucose range:  142   Mean/median:       Weight history:  Wt Readings from Last 3 Encounters:  08/21/22 187 lb (84.8 kg)  08/21/22 188 lb (85.3 kg)  08/13/22 186 lb (84.4 kg)   Previous consultation with dietitian: 2016    LABS:  Lab Results  Component Value Date   HGBA1C 5.1 08/21/2022   HGBA1C 5.2 04/22/2022   HGBA1C 6.1 12/04/2021   Lab Results  Component Value Date   MICROALBUR <0.7 04/29/2022   LDLCALC 14 08/21/2022   CREATININE 0.90 08/21/2022   HYPOTHYROIDISM: This was first diagnosed in 1992 after her treatment for Graves' disease with I-131 She has been on relatively large doses of thyroxine supplements She was on 224 mcg since 11/13 and her TSH was normal in 3/14 Subjectively difficult to assess her thyroid since she tends to have fatigue chronically.  In 2014 her dose was reduced to 200 mcg daily and her dose has been fluctuating since then She had required periodic increase in dosage including in 05/2014 when her TSH was 20  She is on 250 g dosage with extra 125 mcg weekly, continues on the brand name LEVOXYL  She feels fairly good with no unusual fatigue  She has been regular with the Levoxyl every day before breakfast  TSH pending  from today   Lab Results  Component Value Date   TSH 0.41 08/21/2022   TSH 2.54 04/22/2022   TSH 0.99 12/04/2021   FREET4 0.92 08/21/2022   FREET4 0.84 12/04/2021   FREET4 0.73 02/12/2021     Allergies as of 08/21/2022       Reactions   Ephedrine Other (See Comments)   Pt becomes hyper   Oxycodone Other (See Comments)   Panic Attack   Aspirin Other (See Comments)   REACTION: aggrivates colitis Tolerates 81 mg but unable to take doses >81 mg   Erythromycin Other (See Comments)   REACTION: GI upset   Nsaids    REACTION: aggrivate colitis- GI ok'd for short term use   Other Other (See Comments)   Unable to  take due to history of ulcerative colitis   Rosuvastatin Other (See Comments)   REACTION: increased lfts        Medication List        Accurate as of August 21, 2022  8:35 PM. If you have any questions, ask your nurse or doctor.          acetaminophen 650 MG CR tablet Commonly known as: TYLENOL Take 1,300 mg by mouth 2 (two) times daily.   albuterol 108 (90 Base) MCG/ACT inhaler Commonly known as: VENTOLIN HFA INHALE 1 PUFF INTO THE LUNGS EVERY 4 HOURS AS NEEDED FOR WHEEZING   amantadine 100 MG capsule Commonly known as: SYMMETREL Take 100 mg by mouth 2 (two) times daily.   aspirin 81 MG chewable tablet Chew 1 tablet (81 mg total) by mouth daily.   atorvastatin 40 MG tablet Commonly known as: LIPITOR Take 1 tablet (40 mg total) by mouth daily.   B-D UF III MINI PEN NEEDLES 31G X 5 MM Misc Generic drug: Insulin Pen Needle USE FOR INJECTIONS TWICE DAILY   Bayer Microlet Lancets lancets USE AS DIRECTED TO CHECK BLOOD SUGAR TWICE DAILY   CALTRATE 600+D3 PO Take 1 tablet by mouth daily. 300 mg Vitamin D3   Co-Enzyme Q-10 100 MG Caps Take 100 mg by mouth daily.   Contour Next Monitor w/Device Kit 1 each by Does not apply route in the morning and at bedtime. Use Contour Next meter to check blood sugar twice daily.   Contour Next Test test strip Generic drug: glucose blood USE AS DIRECTED.   CULTURELLE PROBIOTICS PO Take 1 capsule by mouth daily. For woman   cyclobenzaprine 5 MG tablet Commonly known as: FLEXERIL Take 1 tablet (5 mg total) by mouth every 8 (eight) hours as needed for muscle spasms (back pain). Caution of sedation   dextroamphetamine 15 MG 24 hr capsule Commonly known as: DEXEDRINE SPANSULE Take 30 mg by mouth every morning and 15 mg at noon as needed   diazepam 10 MG tablet Commonly known as: VALIUM Take 5-10 mg by mouth daily as needed for anxiety.   docusate sodium 100 MG capsule Commonly known as: COLACE Take 100-300 mg by mouth  daily as needed for mild constipation or moderate constipation.   ezetimibe 10 MG tablet Commonly known as: ZETIA TAKE 1 TABLET(10 MG) BY MOUTH DAILY   famotidine 40 MG tablet Commonly known as: PEPCID Take 40 mg by mouth 2 (two) times daily. 30 min. Before meal and at bedtime   fluticasone 50 MCG/ACT nasal spray Commonly known as: FLONASE Place 1 spray into both nostrils daily.   gabapentin 300 MG capsule Commonly known as: NEURONTIN TAKE 1 CAPSULE BY MOUTH 3 TIMES  DAILY   Jardiance 25 MG Tabs tablet Generic drug: empagliflozin TAKE 1 TABLET BY MOUTH ONCE DAILY   ketoconazole 2 % cream Commonly known as: NIZORAL Apply to affected areas one to two times daily as needed.   KRILL OIL PO Take by mouth.   lamoTRIgine 200 MG tablet Commonly known as: LAMICTAL Take 400 mg by mouth daily.   latanoprost 0.005 % ophthalmic solution Commonly known as: XALATAN Place 1 drop into both eyes 2 (two) times a week.   Levemir FlexPen 100 UNIT/ML FlexPen Generic drug: insulin detemir USE 60 UNITS SUBCUTANEOUSLY ONCE EVERY EVENING AS DIRECTED   Levoxyl 125 MCG tablet Generic drug: levothyroxine TAKE 2 TABLETS BY MOUTH ON TUES-SUNDAY BEFORE BREAKFAST AND 3 TABLETS ON MONDAYS   loperamide 2 MG tablet Commonly known as: IMODIUM A-D Take 2 mg by mouth daily as needed for diarrhea or loose stools.   Melatonin 10 MG Caps Take 10 mg by mouth at bedtime.   mesalamine 1.2 g EC tablet Commonly known as: LIALDA TAKE 2 TABLETS BY MOUTH ONCE DAILY   metoprolol succinate 25 MG 24 hr tablet Commonly known as: TOPROL-XL TAKE 1/2 TABLET BY MOUTH ONCE DAILY AS NEEDED AT BEDTIME FOR SYSTOLIC BLOOD PRESSURE >100   Mounjaro 10 MG/0.5ML Pen Generic drug: tirzepatide Inject 10 mg into the skin once a week. Patient takes on Thursdays   multivitamin tablet Take 1 tablet by mouth daily. Woman 50 plus   nitroGLYCERIN 0.4 MG SL tablet Commonly known as: NITROSTAT Place 1 tablet (0.4 mg total)  under the tongue every 5 (five) minutes as needed for chest pain.   NovoLOG FlexPen 100 UNIT/ML FlexPen Generic drug: insulin aspart Use 10 to 15 units when blood sugars are more than 200   olmesartan 5 MG tablet Commonly known as: BENICAR TAKE 1 TABLET BY MOUTH ONCE DAILY   ondansetron 4 MG disintegrating tablet Commonly known as: ZOFRAN-ODT DISSOLVE 1 TABLET ON THE TONGUE EVERY 6 HOURS AS NEEDED FOR NAUSEA OR VOMIT   pantoprazole 40 MG tablet Commonly known as: PROTONIX TAKE 1 TABLET BY MOUTH TWICE DAILY   polyethylene glycol 17 g packet Commonly known as: MIRALAX / GLYCOLAX Take 17 g by mouth 3 (three) times daily as needed for moderate constipation, mild constipation or severe constipation. In water or beverage   predniSONE 20 MG tablet Commonly known as: DELTASONE 2 tabs po daily for 5 days, then 1 tab po daily for 5 days Started by: Karleen Hampshire Copland   QUEtiapine 400 MG 24 hr tablet Commonly known as: SEROQUEL XR Take 800 mg by mouth at bedtime.   Repatha SureClick 140 MG/ML Soaj Generic drug: Evolocumab INJECT 140MG  SUBCUTANEOUSLY EVERY 2 WEEKS   simethicone 80 MG chewable tablet Commonly known as: MYLICON Chew 80 mg by mouth 4 (four) times daily.   Tavaborole 5 % Soln Commonly known as: Kerydin Apply 1 application topically at bedtime. Apply to affected area of left great toenail   valACYclovir 1000 MG tablet Commonly known as: VALTREX TAKE 1 TABLET BY MOUTH ONCE DAILY, take one tablet po bid x 5 days prn outbreak   VITAMIN B COMPLEX PO Take 1 tablet by mouth every other day.   VITAMIN C PO Take 1,000 mg by mouth daily.   Vraylar 3 MG capsule Generic drug: cariprazine Take 3 mg by mouth at bedtime.   zaleplon 10 MG capsule Commonly known as: SONATA Take 10-20 mg by mouth at bedtime.        Allergies:  Allergies  Allergen Reactions   Ephedrine Other (See Comments)    Pt becomes hyper   Oxycodone Other (See Comments)    Panic Attack    Aspirin Other (See Comments)    REACTION: aggrivates colitis Tolerates 81 mg but unable to take doses >81 mg   Erythromycin Other (See Comments)    REACTION: GI upset   Nsaids     REACTION: aggrivate colitis- GI ok'd for short term use   Other Other (See Comments)    Unable to take due to history of ulcerative colitis   Rosuvastatin Other (See Comments)    REACTION: increased lfts    Past Medical History:  Diagnosis Date   ADD (attention deficit disorder) 11/19/2012   Allergic rhinitis, cause unspecified    Anxiety state, unspecified    Arthritis    Asymptomatic postmenopausal status (age-related) (natural)    Atypical hyperplasia of left breast 2000   Atypical mole 03/16/2018   R paraspinal mid upper back, excision   Atypical mole 02/17/2018   R spinal upper back, moderate   Atypical mole 08/11/2017   L sternum, excision   Atypical mole 05/27/2017   L post neck, moderate   Benign neoplasm of other and unspecified site of the digestive system    Bipolar disorder (HCC)    Breast cancer (HCC)    Left Breast 6mm invasive CA left uiq, dcis left uoq and a lot ADH, ALH in both breasts.   CAD (coronary artery disease)    a. 08/2014 NSTEMI/Cath: LM nl, LAD 50p, D1/D2 min irregs, LCX nl, OM1 40, LPDA nl, RI 99ost (2.73mm vessel->med Rx), RCA nl, AM 60, nl EF; b. 11/2016 MV: EF 74%, no ischemia (performed 2/2 dyspnea & new inf TWI).   Carcinoma of left breast (HCC) 06/02/2014   Cataract    Chronic diastolic CHF (congestive heart failure) (HCC)    a. 08/2014 Echo: EF 60-65%, Gr 1 DD, nl LA.   Clotting disorder (HCC)    on blood thiner, Plavix   Cyst    on Achilles tendon   Depression    Diabetes mellitus (HCC)    frank   Dysuria    Edema    Family history of malignant neoplasm of gastrointestinal tract    Fibromyalgia    GERD (gastroesophageal reflux disease)    Glaucoma    pre glaucoma, 05/14/2018 pt. denies glaucoma   Headache    migraines   Hiatal hernia    History of  alcoholism (HCC)    History of migraines    History of ovarian cyst    Hx of adenomatous colonic polyps    Insomnia, unspecified    Myocardial infarction (HCC) 09-04-14   OCD (obsessive compulsive disorder)    Osteopenia 09/25/2016   Femoral neck T -1.1  9/18   Other screening mammogram    Pure hypercholesterolemia    Rosacea    Subacute confusional state 11/19/2012   Tubular adenoma of colon 01/10/11   Ulcerative colitis, unspecified    Unspecified asthma(493.90)    Unspecified essential hypertension    Unspecified hypothyroidism    Unspecified vitamin D deficiency    Vertigo     Past Surgical History:  Procedure Laterality Date   APPENDECTOMY     AXILLARY SENTINEL NODE BIOPSY Left 05/23/2014   Procedure: AXILLARY SENTINEL NODE BIOPSY;  Surgeon: Kieth Brightly, MD;  Location: [facility];  Service: General;  Laterality: Left;   BREAST BIOPSY  08/1998   on tamoxifen, atypical hyperplasia   BREAST  BIOPSY Left 04/2014   BREAST SURGERY Left 08/1998   lumpectomy/ Dr Okey Dupre   BREAST SURGERY Bilateral 05/23/2014   Mastectomy   BUNIONECTOMY Right    CARDIAC CATHETERIZATION N/A 09/05/2014   Procedure: Left Heart Cath and Coronary Angiography;  Surgeon: Iran Ouch, MD;  Location: [facility];  Service: Cardiovascular;  Laterality: N/A;   CARDIAC CATHETERIZATION  09/05/2014   CATARACT EXTRACTION W/ INTRAOCULAR LENS IMPLANT Bilateral    CESAREAN SECTION  1996/1997   placenta previa, gest DM, pre-eclampsia   CHOLECYSTECTOMY  1997   adhesions also   COLONOSCOPY  01/2001   Ulcerative colitis   COLONOSCOPY  04/2004   UC, polyp   COLONOSCOPY  12/2006   UC, no polyps   CORONARY STENT INTERVENTION N/A 04/06/2020   Procedure: CORONARY STENT INTERVENTION;  Surgeon: Yvonne Kendall, MD;  Location: [facility];  Service: Cardiovascular;  Laterality: N/A;   DEXA  04/1999 and 2010   normal   ESOPHAGOGASTRODUODENOSCOPY  09/2001   polyp   exercise stress test   11/2003   negative   FEMUR FRACTURE SURGERY Left    LEFT HEART CATH AND CORONARY ANGIOGRAPHY N/A 04/06/2020   Procedure: LEFT HEART CATH AND CORONARY ANGIOGRAPHY;  Surgeon: Antonieta Iba, MD;  Location: [facility];  Service: Cardiovascular;  Laterality: N/A;   MASTECTOMY Bilateral    MECKEL DIVERTICULUM EXCISION  1999   NASAL SINUS SURGERY  05/1997   nuclear stress test  06/2007   negative   precancerous mole removed     RIGHT AND LEFT HEART CATH     with 1 stent placed in March 2022   SIMPLE MASTECTOMY WITH AXILLARY SENTINEL NODE BIOPSY Bilateral 05/23/2014   Procedure: Bilateral simple mastectomy, left sentinel node biopsy ;  Surgeon: Kieth Brightly, MD;  Location: [facility];  Service: General;  Laterality: Bilateral;   Sleep study  11/2007   no apnea, but did snore (done by [facility])   TONSILLECTOMY  1984   TUBAL LIGATION     WISDOM TOOTH EXTRACTION  1980's    Family History  Problem Relation Age of Onset   Coronary artery disease Father    Colon cancer Father    Alzheimer's disease Father    Dementia Father    Colon polyps Father    Coronary artery disease Mother    Hypertension Mother    Osteoporosis Mother    Crohn's disease Mother        crohns colitis   Breast cancer Other        great aunts   Coronary artery disease Other        Uncle (also AAA)   Diabetes Other        remote family history   Stroke Cousin    Esophageal cancer Neg Hx    Rectal cancer Neg Hx    Stomach cancer Neg Hx     Social History:  reports that she quit smoking about 29 years ago. Her smoking use included cigarettes. She started smoking about 34 years ago. She has a 1.3 pack-year smoking history. She has never used smokeless tobacco. She reports that she does not drink alcohol and does not use drugs.  REVIEW Of SYSTEMS:  Bone health: She is taking OTC vitamin D3, 1000 units recently  Vitamin D level   Bone density in 2022 shows T score -1.1 at the  hip  Recently calcium normal  Lab Results  Component Value Date   CALCIUM  9.2 08/21/2022   PHOS 2.3 01/29/2007   Lab Results  Component Value Date   VD25OH 55.94 08/21/2022   VD25OH 38.13 04/22/2022   VD25OH 27.85 (L) 12/04/2021     HYPERTENSION: Blood pressure is controlled, managed by cardiologist.   She is also on Jardiance  Lab Results  Component Value Date   CREATININE 0.90 08/21/2022   BUN 13 08/21/2022   NA 140 08/21/2022   K 4.3 08/21/2022   CL 104 08/21/2022   CO2 29 08/21/2022    History of hypercholesterolemia with known CAD: Management by cardiologist and had been on 80 mg atorvastatin since her MI Previously Zetia was stopped when she started Repatha However her cardiologist has put her back on this now She is back on her Repatha regularly since after her last visit Also on 40 mg Lipitor   Lab Results  Component Value Date   CHOL 80 08/21/2022   CHOL 90 04/22/2022   CHOL 159 12/04/2021   Lab Results  Component Value Date   HDL 50.00 08/21/2022   HDL 54.70 04/22/2022   HDL 52.00 12/04/2021   Lab Results  Component Value Date   LDLCALC 14 08/21/2022   LDLCALC 19 04/22/2022   LDLCALC 77 02/12/2021   Lab Results  Component Value Date   TRIG 81.0 08/21/2022   TRIG 83.0 04/22/2022   TRIG 226.0 (H) 12/04/2021   Lab Results  Component Value Date   CHOLHDL 2 08/21/2022   CHOLHDL 2 04/22/2022   CHOLHDL 3 12/04/2021   Lab Results  Component Value Date   LDLDIRECT 84.0 12/04/2021   LDLDIRECT 72.0 05/29/2021   LDLDIRECT 125.0 02/07/2020   History of abnormal liver functions:   Lab Results  Component Value Date   ALT 20 08/21/2022      Examination:   BP 108/68   Pulse 77   Ht 5\' 6"  (1.676 m)   Wt 187 lb (84.8 kg)   LMP 08/23/2006 (Approximate) Comment: tubal ligation  SpO2 96%   BMI 30.18 kg/m       Assessments   DIABETES with obesity:  See history of present illness for evaluation of  current management, blood sugar  patterns and problems identified  Her A1c is better at 5.1 and consistent   She is on a regimen of basal insulin using 60 units of Levemir, Jardiance 25 mg and Mounjaro 10 mg weekly  She has continued weight loss along with better blood sugar control from A1c with switching from Ozempic to Twodot She is recently exercising However her fasting blood sugars are low normal Although she is not hypoglycemic she may be getting increased hunger from low normal sugars as well as stress  Discussed her at her morning sugar should be between 80-1 20 She will now reduce her Levemir by 10 units May need to reduce another 5 units if fasting readings are still below 90 Check more readings after meals Consistent exercise Stay on 10 mg Mounjaro for now  Hypothyroidism, post ablative:  TSH is consistently normal but we need to confirm today Recently has no unusual fatigue  Current regimen 250 mg of LEVOXYL daily with extra 125 mcg once a week  Mixed hyperlipidemia: She has marked reduction of LDL with adding Repatha to her atorvastatin Labs to be checked but likely can stop Verio  Abnormal liver function: Improved  Vitamin D deficiency: To take long-term supplements Calcium will be rechecked today along with vitamin D   Follow-up in 4 months  Patient Instructions  Reduce Levemir to 50 units and if morning sugars are still below 80 reduce another 10 units Check blood sugar more often after dinner Consistent exercise For now continue to stay on 10 mg Mounjaro .  Reather Littler 08/21/2022, 8:35 PM   Note: This office note was prepared with Dragon voice recognition system technology. Any transcriptional errors that result from this process are unintentional.   Addendum: Most labs normal and can stop Zetia with very low LDL and continue all other medications TSH low normal, can skip the extra tablet weekly

## 2022-08-22 ENCOUNTER — Encounter: Payer: Self-pay | Admitting: Endocrinology

## 2022-08-28 ENCOUNTER — Other Ambulatory Visit: Payer: Self-pay | Admitting: Family Medicine

## 2022-08-28 ENCOUNTER — Encounter: Payer: Self-pay | Admitting: Family Medicine

## 2022-08-28 MED ORDER — PREDNISONE 20 MG PO TABS: 20.0000 mg | ORAL_TABLET | Freq: Every day | ORAL | 0 refills | Status: DC

## 2022-08-28 NOTE — Telephone Encounter (Signed)
Pt called in stated she in out of town and forget her RX predniSONE (DELTASONE) 20 MG tablet  requesting 3 tablets be [facility] [place] in [facility] # [phone]  . Please advise [phone]

## 2022-08-28 NOTE — Telephone Encounter (Signed)
Lindsay Fry notified by telephone that prescription has been sent to [facility] as requested.

## 2022-09-07 NOTE — Progress Notes (Unsigned)
Date:  09/09/2022   ID:  Lindsay Fry, Lindsay Fry October 04, 1960, MRN [id number]  Patient Location:  [address] [place] [address]   Provider location:   [facility], [place] office  PCP:  Milinda Antis, Audrie Gallus, MD  Cardiologist:  [facility]   Chief Complaint  Patient presents with   12 month follow up     Patient c/o dizziness/lightheadedness. Medications reviewed by the patient verbally.     History of Present Illness:    Lindsay Fry is a 62 y.o. female  past medical history of Morbid obesity,  CAD Severe ostial ramus disease treated medically by catheterization August 2016, Cardiac catheterization April 06, 2020,  underwent stenting of OM branch hypertension,  hyperlipidemia,   Migraines/bipolar I Chronic tachycardia She presents today for follow-up of her coronary artery disease, hypertension, hyperlipidemia, cardiac catheterization with stent placement  Last seen by myself in clinic 8/23 The past week or 2 reports having orthostasis symptoms Took the liberty of stopping olmesartan and metoprolol Blood pressures are running low at home  Recent Lost weight on monjouro, weight down 20 pounds Mounjaro working better than Ozempic  No significant chest pain, no shortness of breath on exertion  Continues on Repatha and Zetia LDL low 14  EKG personally reviewed by myself on todays visit EKG Interpretation Date/Time:  Monday September 09 2022 15:28:19 EDT Ventricular Rate:  90 PR Interval:  202 QRS Duration:  94 QT Interval:  350 QTC Calculation: 428 R Axis:   -41  Text Interpretation: Normal sinus rhythm Left axis deviation T wave abnormality, consider lateral ischemia When compared with ECG of 07-Apr-2020 05:20, Premature ventricular complexes are no longer Present T wave inversion less evident in Anterolateral leads Confirmed by Julien Nordmann [id number]) on 09/09/2022 3:45:08 PM    Cardiac catheterization April 06, 2020 Severe OM disease, likely culprit vessel Chronic occlusion of small ramus branch Moderate to severe disease of proximal diagonal #2  Mild to moderate proximal LAD disease underwent stenting of OM branch  Sleep disorder, attributes to bipolar Feels her bipolar is well controlled  EKG personally reviewed by myself on todays visit EKG Interpretation Date/Time:  Monday September 09 2022 15:28:19 EDT Ventricular Rate:  90 PR Interval:  202 QRS Duration:  94 QT Interval:  350 QTC Calculation: 428 R Axis:   -41  Text Interpretation: Normal sinus rhythm Left axis deviation T wave abnormality, consider lateral ischemia When compared with ECG of 07-Apr-2020 05:20, Premature ventricular complexes are no longer Present T wave inversion less evident in Anterolateral leads Confirmed by Julien Nordmann [phone]) on 09/09/2022 3:45:08 PM   Past Medical History:  Diagnosis Date   ADD (attention deficit disorder) 11/19/2012   Allergic rhinitis, cause unspecified    Anxiety state, unspecified    Arthritis    Asymptomatic postmenopausal status (age-related) (natural)    Atypical hyperplasia of left breast 2000   Atypical mole 03/16/2018   R paraspinal mid upper back, excision   Atypical mole 02/17/2018   R spinal upper back, moderate   Atypical mole 08/11/2017   L sternum, excision   Atypical mole 05/27/2017   L post neck, moderate   Benign neoplasm of other and unspecified site of the digestive system    Bipolar disorder (HCC)    Breast cancer (HCC)    Left Breast 6mm invasive CA left uiq, dcis left uoq and a lot ADH, ALH in both breasts.  CAD (coronary artery disease)    a. 08/2014 NSTEMI/Cath: LM nl, LAD 50p, D1/D2 min irregs, LCX nl, OM1 40, LPDA nl, RI 99ost (2.48mm vessel->med Rx), RCA nl, AM 60, nl EF; b. 11/2016 MV: EF 74%, no ischemia (performed 2/2 dyspnea & new inf TWI).   Carcinoma of left breast (HCC) 06/02/2014   Cataract    Chronic diastolic CHF (congestive heart failure) (HCC)     a. 08/2014 Echo: EF 60-65%, Gr 1 DD, nl LA.   Clotting disorder (HCC)    on blood thiner, Plavix   Cyst    on Achilles tendon   Depression    Diabetes mellitus (HCC)    frank   Dysuria    Edema    Family history of malignant neoplasm of gastrointestinal tract    Fibromyalgia    GERD (gastroesophageal reflux disease)    Glaucoma    pre glaucoma, 05/14/2018 pt. denies glaucoma   Headache    migraines   Hiatal hernia    History of alcoholism (HCC)    History of migraines    History of ovarian cyst    Hx of adenomatous colonic polyps    Insomnia, unspecified    Myocardial infarction (HCC) 09-04-14   OCD (obsessive compulsive disorder)    Osteopenia 09/25/2016   Femoral neck T -1.1  9/18   Other screening mammogram    Pure hypercholesterolemia    Rosacea    Subacute confusional state 11/19/2012   Tubular adenoma of colon 01/10/11   Ulcerative colitis, unspecified    Unspecified asthma(493.90)    Unspecified essential hypertension    Unspecified hypothyroidism    Unspecified vitamin D deficiency    Vertigo    Past Surgical History:  Procedure Laterality Date   APPENDECTOMY     AXILLARY SENTINEL NODE BIOPSY Left 05/23/2014   Procedure: AXILLARY SENTINEL NODE BIOPSY;  Surgeon: Kieth Brightly, MD;  Location: [facility];  Service: General;  Laterality: Left;   BREAST BIOPSY  08/1998   on tamoxifen, atypical hyperplasia   BREAST BIOPSY Left 04/2014   BREAST SURGERY Left 08/1998   lumpectomy/ Dr Okey Dupre   BREAST SURGERY Bilateral 05/23/2014   Mastectomy   BUNIONECTOMY Right    CARDIAC CATHETERIZATION N/A 09/05/2014   Procedure: Left Heart Cath and Coronary Angiography;  Surgeon: Iran Ouch, MD;  Location: [facility];  Service: Cardiovascular;  Laterality: N/A;   CARDIAC CATHETERIZATION  09/05/2014   CATARACT EXTRACTION W/ INTRAOCULAR LENS IMPLANT Bilateral    CESAREAN SECTION  1996/1997   placenta previa, gest DM, pre-eclampsia   CHOLECYSTECTOMY  1997    adhesions also   COLONOSCOPY  01/2001   Ulcerative colitis   COLONOSCOPY  04/2004   UC, polyp   COLONOSCOPY  12/2006   UC, no polyps   CORONARY STENT INTERVENTION N/A 04/06/2020   Procedure: CORONARY STENT INTERVENTION;  Surgeon: Yvonne Kendall, MD;  Location: [facility];  Service: Cardiovascular;  Laterality: N/A;   DEXA  04/1999 and 2010   normal   ESOPHAGOGASTRODUODENOSCOPY  09/2001   polyp   exercise stress test  11/2003   negative   FEMUR FRACTURE SURGERY Left    LEFT HEART CATH AND CORONARY ANGIOGRAPHY N/A 04/06/2020   Procedure: LEFT HEART CATH AND CORONARY ANGIOGRAPHY;  Surgeon: Antonieta Iba, MD;  Location: [facility];  Service: Cardiovascular;  Laterality: N/A;   MASTECTOMY Bilateral    MECKEL DIVERTICULUM EXCISION  1999   NASAL SINUS SURGERY  05/1997  nuclear stress test  06/2007   negative   precancerous mole removed     RIGHT AND LEFT HEART CATH     with 1 stent placed in March 2022   SIMPLE MASTECTOMY WITH AXILLARY SENTINEL NODE BIOPSY Bilateral 05/23/2014   Procedure: Bilateral simple mastectomy, left sentinel node biopsy ;  Surgeon: Kieth Brightly, MD;  Location: [facility];  Service: General;  Laterality: Bilateral;   Sleep study  11/2007   no apnea, but did snore (done by [facility])   TONSILLECTOMY  1984   TUBAL LIGATION     WISDOM TOOTH EXTRACTION  1980's     Allergies:   Ephedrine, Oxycodone, Aspirin, Erythromycin, Nsaids, Other, and Rosuvastatin   Social History   Tobacco Use   Smoking status: Former    Current packs/day: 0.00    Average packs/day: 0.3 packs/day for 5.0 years (1.3 ttl pk-yrs)    Types: Cigarettes    Start date: 01/15/1988    Quit date: 01/14/1993    Years since quitting: 29.6   Smokeless tobacco: Never  Vaping Use   Vaping status: Never Used  Substance Use Topics   Alcohol use: No    Alcohol/week: 0.0 standard drinks of alcohol    Comment: Recovered ETOH   Drug use: No     Current Outpatient  Medications on File Prior to Visit  Medication Sig Dispense Refill   acetaminophen (TYLENOL) 650 MG CR tablet Take 1,300 mg by mouth 2 (two) times daily.     albuterol (VENTOLIN HFA) 108 (90 Base) MCG/ACT inhaler INHALE 1 PUFF INTO THE LUNGS EVERY 4 HOURS AS NEEDED FOR WHEEZING     amantadine (SYMMETREL) 100 MG capsule Take 100 mg by mouth 2 (two) times daily.     Ascorbic Acid (VITAMIN C PO) Take 1,000 mg by mouth daily.     aspirin 81 MG chewable tablet Chew 1 tablet (81 mg total) by mouth daily. 90 tablet 0   atorvastatin (LIPITOR) 40 MG tablet Take 1 tablet (40 mg total) by mouth daily. 90 tablet 0   B Complex Vitamins (VITAMIN B COMPLEX PO) Take 1 tablet by mouth every other day.     BAYER MICROLET LANCETS lancets USE AS DIRECTED TO CHECK BLOOD SUGAR TWICE DAILY 200 each 2   Blood Glucose Monitoring Suppl (CONTOUR NEXT MONITOR) w/Device KIT 1 each by Does not apply route in the morning and at bedtime. Use Contour Next meter to check blood sugar twice daily. 1 kit 0   Calcium Carb-Cholecalciferol (CALTRATE 600+D3 PO) Take 1 tablet by mouth daily. 300 mg Vitamin D3     Co-Enzyme Q-10 100 MG CAPS Take 100 mg by mouth daily.     cyclobenzaprine (FLEXERIL) 5 MG tablet Take 1 tablet (5 mg total) by mouth every 8 (eight) hours as needed for muscle spasms (back pain). Caution of sedation 30 tablet 1   dextroamphetamine (DEXEDRINE SPANSULE) 15 MG 24 hr capsule Take 30 mg by mouth every morning and 15 mg at noon as needed     diazepam (VALIUM) 10 MG tablet Take 2.5-7.5 mg by mouth daily as needed for anxiety.     docusate sodium (COLACE) 100 MG capsule Take 100-300 mg by mouth daily as needed for mild constipation or moderate constipation.     Evolocumab (REPATHA SURECLICK) 140 MG/ML SOAJ INJECT 140MG  SUBCUTANEOUSLY EVERY 2 WEEKS 2 mL 2   ezetimibe (ZETIA) 10 MG tablet TAKE 1 TABLET(10 MG) BY MOUTH DAILY 90 tablet 3   famotidine (PEPCID)  40 MG tablet Take 40 mg by mouth 2 (two) times daily. 30 min.  Before meal and at bedtime     fluticasone (FLONASE) 50 MCG/ACT nasal spray Place 1 spray into both nostrils daily. 16 g 6   gabapentin (NEURONTIN) 300 MG capsule TAKE 1 CAPSULE BY MOUTH 3 TIMES DAILY 270 capsule 1   glucose blood (CONTOUR NEXT TEST) test strip USE AS DIRECTED. 100 each 5   insulin aspart (NOVOLOG FLEXPEN) 100 UNIT/ML FlexPen Use 10 to 15 units when blood sugars are more than 200 15 mL 1   insulin detemir (LEVEMIR FLEXPEN) 100 UNIT/ML FlexPen USE 60 UNITS SUBCUTANEOUSLY ONCE EVERY EVENING AS DIRECTED (Patient taking differently: 50 Units daily.) 15 mL 5   Insulin Pen Needle (B-D UF III MINI PEN NEEDLES) 31G X 5 MM MISC USE FOR INJECTIONS TWICE DAILY 150 each 3   JARDIANCE 25 MG TABS tablet TAKE 1 TABLET BY MOUTH ONCE DAILY 90 tablet 3   ketoconazole (NIZORAL) 2 % cream Apply to affected areas one to two times daily as needed. 30 g 11   KRILL OIL PO Take by mouth.     lamoTRIgine (LAMICTAL) 200 MG tablet Take 400 mg by mouth daily.     latanoprost (XALATAN) 0.005 % ophthalmic solution Place 1 drop into both eyes 2 (two) times a week.      levothyroxine (LEVOXYL) 125 MCG tablet Take 2 tablets by mouth daily     loperamide (IMODIUM A-D) 2 MG tablet Take 2 mg by mouth daily as needed for diarrhea or loose stools.     Melatonin 10 MG CAPS Take 10 mg by mouth at bedtime.     mesalamine (LIALDA) 1.2 g EC tablet TAKE 2 TABLETS BY MOUTH ONCE DAILY 180 tablet 1   metoprolol succinate (TOPROL-XL) 25 MG 24 hr tablet TAKE 1/2 TABLET BY MOUTH ONCE DAILY AS NEEDED AT BEDTIME FOR SYSTOLIC BLOOD PRESSURE >100 45 tablet 0   Multiple Vitamin (MULTIVITAMIN) tablet Take 1 tablet by mouth daily. Woman 50 plus     nitroGLYCERIN (NITROSTAT) 0.4 MG SL tablet Place 1 tablet (0.4 mg total) under the tongue every 5 (five) minutes as needed for chest pain. 25 tablet 3   olmesartan (BENICAR) 5 MG tablet TAKE 1 TABLET BY MOUTH ONCE DAILY 90 tablet 0   ondansetron (ZOFRAN-ODT) 4 MG disintegrating tablet  DISSOLVE 1 TABLET ON THE TONGUE EVERY 6 HOURS AS NEEDED FOR NAUSEA OR VOMIT 90 tablet 1   pantoprazole (PROTONIX) 40 MG tablet TAKE 1 TABLET BY MOUTH TWICE DAILY 180 tablet 1   polyethylene glycol (MIRALAX / GLYCOLAX) 17 g packet Take 17 g by mouth 3 (three) times daily as needed for moderate constipation, mild constipation or severe constipation. In water or beverage     Probiotic Product (CULTURELLE PROBIOTICS PO) Take 1 capsule by mouth daily. For woman     QUEtiapine (SEROQUEL XR) 400 MG 24 hr tablet Take 800 mg by mouth at bedtime.     simethicone (MYLICON) 80 MG chewable tablet Chew 80 mg by mouth 4 (four) times daily.     tirzepatide (MOUNJARO) 10 MG/0.5ML Pen Inject 10 mg into the skin once a week. Patient takes on Thursdays     valACYclovir (VALTREX) 1000 MG tablet TAKE 1 TABLET BY MOUTH ONCE DAILY, take one tablet po bid x 5 days prn outbreak 90 tablet 3   VRAYLAR capsule Take 3 mg by mouth at bedtime.     zaleplon (SONATA) 10 MG capsule Take  10-20 mg by mouth at bedtime.     Tavaborole (KERYDIN) 5 % SOLN Apply 1 application topically at bedtime. Apply to affected area of left great toenail (Patient not taking: Reported on 09/09/2022) 10 mL 6   No current facility-administered medications on file prior to visit.     Family Hx: The patient's family history includes Alzheimer's disease in her father; Breast cancer in an other family member; Colon cancer in her father; Colon polyps in her father; Coronary artery disease in her father, mother, and another family member; Crohn's disease in her mother; Dementia in her father; Diabetes in an other family member; Hypertension in her mother; Osteoporosis in her mother; Stroke in her cousin. There is no history of Esophageal cancer, Rectal cancer, or Stomach cancer.  ROS:   Please see the history of present illness.    Review of Systems  Constitutional: Negative.   HENT: Negative.    Respiratory: Negative.    Cardiovascular: Negative.    Gastrointestinal: Negative.   Musculoskeletal: Negative.   Neurological: Negative.   Psychiatric/Behavioral: Negative.    All other systems reviewed and are negative.    Labs/Other Tests and Data Reviewed:    Recent Labs: 05/24/2022: Hemoglobin 16.1; Platelets 304 08/21/2022: ALT 20; BUN 13; Creatinine, Ser 0.90; Potassium 4.3; Sodium 140; TSH 0.41   Recent Lipid Panel Lab Results  Component Value Date/Time   CHOL 80 08/21/2022 12:08 PM   TRIG 81.0 08/21/2022 12:08 PM   HDL 50.00 08/21/2022 12:08 PM   CHOLHDL 2 08/21/2022 12:08 PM   LDLCALC 14 08/21/2022 12:08 PM   LDLDIRECT 84.0 12/04/2021 10:06 AM    Wt Readings from Last 3 Encounters:  09/09/22 187 lb (84.8 kg)  08/21/22 187 lb (84.8 kg)  08/21/22 188 lb (85.3 kg)     Exam:    BP 110/78 (BP Location: Left Arm, Patient Position: Sitting, Cuff Size: Normal)   Pulse 90   Ht 5\' 7"  (1.702 m)   Wt 187 lb (84.8 kg)   LMP 08/23/2006 (Approximate) Comment: tubal ligation  SpO2 97%   BMI 29.29 kg/m  Constitutional:  oriented to person, place, and time. No distress.  HENT:  Head: Grossly normal Eyes:  no discharge. No scleral icterus.  Neck: No JVD, no carotid bruits  Cardiovascular: Regular rate and rhythm, no murmurs appreciated Pulmonary/Chest: Clear to auscultation bilaterally, no wheezes or rails Abdominal: Soft.  no distension.  no tenderness.  Musculoskeletal: Normal range of motion Neurological:  normal muscle tone. Coordination normal. No atrophy Skin: Skin warm and dry Psychiatric: normal affect, pleasant  ASSESSMENT & PLAN:    Problem List Items Addressed This Visit       Cardiology Problems   Essential hypertension   Relevant Orders   EKG 12-Lead (Completed)   Atherosclerosis of native coronary artery of native heart with angina pectoris (HCC)   Relevant Orders   EKG 12-Lead (Completed)     Other   DM (diabetes mellitus) (HCC) (Chronic)   Other Visit Diagnoses     Type 2 diabetes mellitus with  obesity (HCC)    -  Primary   Coronary artery disease involving native coronary artery of native heart without angina pectoris       Relevant Orders   EKG 12-Lead (Completed)   Frequent PVCs       Relevant Orders   EKG 12-Lead (Completed)   Hyperlipidemia, mixed       History of tobacco use       Hyperlipidemia LDL goal <  70          CAD with stable angina Currently with no symptoms of angina. No further workup at this time. Continue current medication regimen. In 2022 , stent placement ostial OM vessel On asa , Repatha Okay to hold Zetia given LDL 14  Hyperlipidemia Numbers well-controlled Continue Lipitor with Repatha, hold Zetia  Bipolar Followed by psychiatrist Stable  Obesity Weight trending down on Mounjaro  Diabetes with complications A1C well controlled Carb restriction recommended  Essential hypertension Olmesartan and metoprolol held given hypotension exacerbated by recent weight loss Recommend she stay hydrated For worsening symptoms may need midodrine     Total encounter time more than 30 minutes  Greater than 50% was spent in counseling and coordination of care with the patient    Signed, Julien Nordmann, MD  [facility] [address], [place], [place] [address]

## 2022-09-09 ENCOUNTER — Ambulatory Visit: Payer: BC Managed Care – PPO | Attending: Cardiovascular Disease | Admitting: Cardiovascular Disease

## 2022-09-09 ENCOUNTER — Encounter: Payer: Self-pay | Admitting: Cardiovascular Disease

## 2022-09-09 VITALS — BP 110/78 | HR 90 | Ht 67.0 in | Wt 187.0 lb

## 2022-09-09 DIAGNOSIS — I2510 Atherosclerotic heart disease of native coronary artery without angina pectoris: Secondary | ICD-10-CM

## 2022-09-09 DIAGNOSIS — I10 Essential (primary) hypertension: Secondary | ICD-10-CM | POA: Diagnosis not present

## 2022-09-09 DIAGNOSIS — E1169 Type 2 diabetes mellitus with other specified complication: Secondary | ICD-10-CM

## 2022-09-09 DIAGNOSIS — Z87891 Personal history of nicotine dependence: Secondary | ICD-10-CM

## 2022-09-09 DIAGNOSIS — E785 Hyperlipidemia, unspecified: Secondary | ICD-10-CM

## 2022-09-09 DIAGNOSIS — I25119 Atherosclerotic heart disease of native coronary artery with unspecified angina pectoris: Secondary | ICD-10-CM

## 2022-09-09 DIAGNOSIS — Z794 Long term (current) use of insulin: Secondary | ICD-10-CM

## 2022-09-09 DIAGNOSIS — E1159 Type 2 diabetes mellitus with other circulatory complications: Secondary | ICD-10-CM

## 2022-09-09 DIAGNOSIS — E669 Obesity, unspecified: Secondary | ICD-10-CM

## 2022-09-09 DIAGNOSIS — I493 Ventricular premature depolarization: Secondary | ICD-10-CM

## 2022-09-09 DIAGNOSIS — E782 Mixed hyperlipidemia: Secondary | ICD-10-CM

## 2022-09-09 MED ORDER — METOPROLOL TARTRATE 25 MG PO TABS: 25.0000 mg | ORAL_TABLET | Freq: Two times a day (BID) | ORAL | 3 refills | Status: AC | PRN

## 2022-09-09 NOTE — Patient Instructions (Signed)
Medication Instructions:  Stop zetia Stop olmesartan  Stop metoprolol succinate Take metoprolol tartrate 12.5 up to 25 mg as needed for  tachycardia  If you need a refill on your cardiac medications before your next appointment, please call your pharmacy.   Lab work: No new labs needed  Testing/Procedures: No new testing needed  Follow-Up: At [facility], you and your health needs are our priority.  As part of our continuing mission to provide you with exceptional heart care, we have created designated Provider Care Teams.  These Care Teams include your primary Cardiologist (physician) and Advanced Practice Providers (APPs -  Physician Assistants and Nurse Practitioners) who all work together to provide you with the care you need, when you need it.  You will need a follow up appointment in 12 months  Providers on your designated Care Team:   Nicolasa Ducking, NP Eula Listen, PA-C Cadence Fransico Michael, [place]  COVID-19 Vaccine Information can be found at: [email or web address] For questions related to vaccine distribution or appointments, please email [email or web address] or call [phone].

## 2022-09-24 ENCOUNTER — Ambulatory Visit: Payer: BC Managed Care – PPO | Admitting: Dermatology

## 2022-09-24 VITALS — BP 138/87 | HR 102

## 2022-09-24 DIAGNOSIS — L578 Other skin changes due to chronic exposure to nonionizing radiation: Secondary | ICD-10-CM

## 2022-09-24 DIAGNOSIS — D229 Melanocytic nevi, unspecified: Secondary | ICD-10-CM

## 2022-09-24 DIAGNOSIS — W908XXA Exposure to other nonionizing radiation, initial encounter: Secondary | ICD-10-CM | POA: Diagnosis not present

## 2022-09-24 DIAGNOSIS — L821 Other seborrheic keratosis: Secondary | ICD-10-CM

## 2022-09-24 DIAGNOSIS — L814 Other melanin hyperpigmentation: Secondary | ICD-10-CM

## 2022-09-24 DIAGNOSIS — D225 Melanocytic nevi of trunk: Secondary | ICD-10-CM

## 2022-09-24 DIAGNOSIS — D2221 Melanocytic nevi of right ear and external auricular canal: Secondary | ICD-10-CM

## 2022-09-24 DIAGNOSIS — L219 Seborrheic dermatitis, unspecified: Secondary | ICD-10-CM

## 2022-09-24 DIAGNOSIS — Z1283 Encounter for screening for malignant neoplasm of skin: Secondary | ICD-10-CM

## 2022-09-24 DIAGNOSIS — Z86018 Personal history of other benign neoplasm: Secondary | ICD-10-CM

## 2022-09-24 DIAGNOSIS — L719 Rosacea, unspecified: Secondary | ICD-10-CM

## 2022-09-24 DIAGNOSIS — D1801 Hemangioma of skin and subcutaneous tissue: Secondary | ICD-10-CM

## 2022-09-24 NOTE — Progress Notes (Signed)
Follow-Up Visit   Subjective  Lindsay Fry is a 62 y.o. female who presents for the following: Skin Cancer Screening and Full Body Skin Exam  The patient presents for Total-Body Skin Exam (TBSE) for skin cancer screening and mole check. The patient has spots, moles and lesions to be evaluated, some may be new or changing. She has a history of dysplastic nevi. She has seborrheic dermatitis of the face, controlled with ketoconazole 2% cream. She has Skin Medicinals Triple Cream, but not currently using.    The following portions of the chart were reviewed this encounter and updated as appropriate: medications, allergies, medical history  Review of Systems:  No other skin or systemic complaints except as noted in HPI or Assessment and Plan.  Objective  Well appearing patient in no apparent distress; mood and affect are within normal limits.  A full examination was performed including scalp, head, eyes, ears, nose, lips, neck, chest, axillae, abdomen, back, buttocks, bilateral upper extremities, bilateral lower extremities, hands, feet, fingers, toes, fingernails, and toenails. All findings within normal limits unless otherwise noted below.   Relevant physical exam findings are noted in the Assessment and Plan.    Assessment & Plan   SKIN CANCER SCREENING PERFORMED TODAY.  ACTINIC DAMAGE - Chronic condition, secondary to cumulative UV/sun exposure - diffuse scaly erythematous macules with underlying dyspigmentation - Recommend daily broad spectrum sunscreen SPF 30+ to sun-exposed areas, reapply every 2 hours as needed.  - Staying in the shade or wearing long sleeves, sun glasses (UVA+UVB protection) and wide brim hats (4-inch brim around the entire circumference of the hat) are also recommended for sun protection.  - Call for new or changing lesions.  LENTIGINES, SEBORRHEIC KERATOSES, HEMANGIOMAS - Benign normal skin lesions - Benign-appearing - Call for any  changes  MELANOCYTIC NEVI - Tan-brown and/or pink-flesh-colored symmetric macules and papules - right earlobe 5 mm brown macule    - Right Lower Back 7 x 3 mm speckled brown papule    - right lower sternum 4 mm medium brown macule with darker center stable when compared to photos    - spinal upper back 10 x 7 mm brown speckled papule stable when compared to photos    - left upper back  7 x 5 mm speckled brown macule - Stable - Benign appearing on exam today - Observation - Call clinic for new or changing moles - Recommend daily use of broad spectrum spf 30+ sunscreen to sun-exposed areas.   History of Dysplastic Nevi - No evidence of recurrence today - Recommend regular full body skin exams - Recommend daily broad spectrum sunscreen SPF 30+ to sun-exposed areas, reapply every 2 hours as needed.  - Call if any new or changing lesions are noted between office visits  ROSACEA Exam Mild/mod erythema with telangiectasias of the malar cheeks and nose  Chronic and persistent condition with duration or expected duration over one year. Condition is improving with treatment but not currently at goal.   Rosacea is a chronic progressive skin condition usually affecting the face of adults, causing redness and/or acne bumps. It is treatable but not curable. It sometimes affects the eyes (ocular rosacea) as well. It may respond to topical and/or systemic medication and can flare with stress, sun exposure, alcohol, exercise, topical steroids (including hydrocortisone/cortisone 10) and some foods.  Daily application of broad spectrum spf 30+ sunscreen to face is recommended to reduce flares.  Patient denies grittiness of the eyes  Treatment Plan Continue Skin  Medicinals Triple Cream at bedtime prn.  She stopped it over the summer, but plans to restart this fall.  SEBORRHEIC DERMATITIS Exam: Scaling of the scalp; face clear.  Chronic condition with duration or expected duration over one  year. Currently well-controlled.   Seborrheic Dermatitis is a chronic persistent rash characterized by pinkness and scaling most commonly of the mid face but also can occur on the scalp (dandruff), ears; mid chest, mid back and groin.  It tends to be exacerbated by stress and cooler weather.  People who have neurologic disease may experience new onset or exacerbation of existing seborrheic dermatitis.  The condition is not curable but treatable and can be controlled.  Treatment Plan: Continue ketoconazole 2% cream 1-2 times daily as needed to aas face. Start Head & Shoulders Supreme shampoo for colored hair - massage into scalp and let sit several minutes before rinsing.   Return in about 1 year (around 09/24/2023) for TBSE, Hx Dysplastic Nevus.  ICherlyn Labella, CMA, am acting as scribe for Willeen Niece, MD .   Documentation: I have reviewed the above documentation for accuracy and completeness, and I agree with the above.  Willeen Niece, MD

## 2022-09-24 NOTE — Patient Instructions (Signed)

## 2022-09-25 ENCOUNTER — Other Ambulatory Visit: Payer: Self-pay | Admitting: Endocrinology

## 2022-09-25 ENCOUNTER — Encounter: Payer: Self-pay | Admitting: Endocrinology

## 2022-09-25 MED ORDER — TIRZEPATIDE 2.5 MG/0.5ML ~~LOC~~ SOAJ: 2.5000 mg | SUBCUTANEOUS | 0 refills | Status: DC

## 2022-09-25 NOTE — Telephone Encounter (Signed)
She can increase to Mounjaro 12.5 mg weekly, I sent the prescription.

## 2022-09-30 MED ORDER — MOUNJARO 12.5 MG/0.5ML ~~LOC~~ SOAJ: 12.5000 mg | SUBCUTANEOUS | 0 refills | Status: DC

## 2022-10-02 ENCOUNTER — Other Ambulatory Visit: Payer: Self-pay | Admitting: Family Medicine

## 2022-10-03 NOTE — Telephone Encounter (Signed)
Last OV was on 08/13/22 for acute pain. Gabapentin last filled 04/02/22 #270 caps/ 1 refill Flexeril last filled 08/13/22 #30 tabs/ 1 refill

## 2022-10-11 ENCOUNTER — Other Ambulatory Visit: Payer: Self-pay | Admitting: Internal Medicine

## 2022-10-11 DIAGNOSIS — R110 Nausea: Secondary | ICD-10-CM

## 2022-11-25 ENCOUNTER — Other Ambulatory Visit: Payer: Self-pay | Admitting: Endocrinology

## 2022-11-26 ENCOUNTER — Other Ambulatory Visit: Payer: Self-pay | Admitting: Internal Medicine

## 2022-11-26 ENCOUNTER — Other Ambulatory Visit: Payer: Self-pay

## 2022-11-26 DIAGNOSIS — E1169 Type 2 diabetes mellitus with other specified complication: Secondary | ICD-10-CM

## 2022-11-26 DIAGNOSIS — E782 Mixed hyperlipidemia: Secondary | ICD-10-CM

## 2022-11-26 MED ORDER — REPATHA SURECLICK 140 MG/ML ~~LOC~~ SOAJ: SUBCUTANEOUS | 2 refills | Status: DC

## 2022-11-30 ENCOUNTER — Other Ambulatory Visit: Payer: Self-pay | Admitting: Cardiovascular Disease

## 2022-12-11 ENCOUNTER — Other Ambulatory Visit: Payer: Self-pay

## 2022-12-11 ENCOUNTER — Encounter: Payer: Self-pay | Admitting: Endocrinology

## 2022-12-11 DIAGNOSIS — E890 Postprocedural hypothyroidism: Secondary | ICD-10-CM

## 2022-12-11 DIAGNOSIS — E1169 Type 2 diabetes mellitus with other specified complication: Secondary | ICD-10-CM

## 2022-12-11 MED ORDER — LEVOTHYROXINE SODIUM 125 MCG PO TABS: ORAL_TABLET | ORAL | 2 refills | Status: DC

## 2022-12-11 NOTE — Telephone Encounter (Signed)
Levothyroxine refiil request complete, patient made aware via mychart

## 2022-12-20 ENCOUNTER — Other Ambulatory Visit (INDEPENDENT_AMBULATORY_CARE_PROVIDER_SITE_OTHER): Payer: Medicare Other

## 2022-12-20 DIAGNOSIS — E1169 Type 2 diabetes mellitus with other specified complication: Secondary | ICD-10-CM | POA: Diagnosis not present

## 2022-12-20 DIAGNOSIS — E890 Postprocedural hypothyroidism: Secondary | ICD-10-CM | POA: Diagnosis not present

## 2022-12-20 DIAGNOSIS — E669 Obesity, unspecified: Secondary | ICD-10-CM

## 2022-12-20 LAB — BASIC METABOLIC PANEL
BUN: 13 mg/dL (ref 6–23)
CO2: 30 meq/L (ref 19–32)
Calcium: 9.6 mg/dL (ref 8.4–10.5)
Chloride: 107 meq/L (ref 96–112)
Creatinine, Ser: 0.93 mg/dL (ref 0.40–1.20)
GFR: 65 mL/min (ref >60.00)
Glucose, Bld: 81 mg/dL (ref 70–99)
Potassium: 4.6 meq/L (ref 3.5–5.1)
Sodium: 144 meq/L (ref 135–145)

## 2022-12-20 LAB — HEMOGLOBIN A1C: Hgb A1c MFr Bld: 5.2 % (ref 4.6–6.5)

## 2022-12-20 LAB — TSH: TSH: 1.24 u[IU]/mL (ref 0.35–5.50)

## 2022-12-31 ENCOUNTER — Other Ambulatory Visit: Payer: Self-pay | Admitting: Internal Medicine

## 2022-12-31 ENCOUNTER — Encounter: Payer: Self-pay | Admitting: Endocrinology

## 2022-12-31 ENCOUNTER — Ambulatory Visit (INDEPENDENT_AMBULATORY_CARE_PROVIDER_SITE_OTHER): Payer: Medicare Other | Admitting: Endocrinology

## 2022-12-31 VITALS — BP 136/88 | HR 85 | Resp 20 | Ht 67.0 in | Wt 169.8 lb

## 2022-12-31 DIAGNOSIS — E782 Mixed hyperlipidemia: Secondary | ICD-10-CM

## 2022-12-31 DIAGNOSIS — Z794 Long term (current) use of insulin: Secondary | ICD-10-CM | POA: Diagnosis not present

## 2022-12-31 DIAGNOSIS — E118 Type 2 diabetes mellitus with unspecified complications: Secondary | ICD-10-CM | POA: Diagnosis not present

## 2022-12-31 DIAGNOSIS — E890 Postprocedural hypothyroidism: Secondary | ICD-10-CM

## 2022-12-31 MED ORDER — TRESIBA FLEXTOUCH 100 UNIT/ML ~~LOC~~ SOPN: 15.0000 [IU] | PEN_INJECTOR | Freq: Every day | SUBCUTANEOUS | 4 refills | Status: DC

## 2022-12-31 MED ORDER — TIRZEPATIDE 15 MG/0.5ML ~~LOC~~ SOAJ: 15.0000 mg | SUBCUTANEOUS | 4 refills | Status: DC

## 2022-12-31 MED ORDER — REPATHA SURECLICK 140 MG/ML ~~LOC~~ SOAJ
SUBCUTANEOUS | 3 refills | Status: DC
Start: 2022-12-31 — End: 2023-05-09

## 2022-12-31 NOTE — Progress Notes (Signed)
Outpatient Endocrinology Note Lindsay Teneshia Hedeen, MD  12/31/22  Patient's Name: Lindsay Fry    DOB: 01/25/1960    MRN: [id number]                                                    REASON OF VISIT: Follow up for type 2 diabetes mellitus  PCP: Tower, Audrie Gallus, MD  HISTORY OF PRESENT ILLNESS:   Lindsay Fry is a 62 y.o. old female with past medical history listed below, is here for follow up for type 2 diabetes mellitus.   Send was previously seen by Dr. Lucianne Muss and was last time seen in August 2024.  Pertinent Diabetes History: Patient was diagnosed with type 2 diabetes mellitus in March 2016.  She has been on various antidiabetic medications.  Insulin therapy was started in August 2017.  Patient has controlled type 2 diabetes mellitus.  Chronic Diabetes Complications : Retinopathy: no. Last ophthalmology exam was done on DUE.  Nephropathy: no Peripheral neuropathy: no.  Patient has been on gabapentin for fibromyalgia. Coronary artery disease: yes Stroke: no  Relevant comorbidities and cardiovascular risk factors: Obesity: no Body mass index is 26.59 kg/m.  Hypertension: Yes  Hyperlipidemia : Yes, on statin.  She is on Repatha.  Current / Home Diabetic regimen includes:  Non-insulin hypoglycemic drugs: Mounjaro 12.5 mg weekly, Jardiance 25 mg daily    INSULIN regimen: Levemir 30-32 units at bedtime  Prior diabetic medications: Metformin ER 2000 mg daily, stopped due to diarrhea.  She used to be on Janumet, Victoza in the past.  Ozempic was changed to [facility].  Glycemic data:    Not able to download glucometer in the clinic today.  Not able to review glucose data from glucometer directly as well.  By recall she mentioned her blood sugar in the morning fasting usually 70-100 range.  Postprandial she has been checking occasionally and 1 time it was 120 after 2 hours.  Occasional hypoglycemia with blood sugar as low as 64 on higher dose of Levemir.  She declined continuous  glucose monitoring.  Hypoglycemia: Patient has minor /occasional hypoglycemic episodes. Patient has hypoglycemia awareness\.  Factors modifying glucose control: 1.  Diabetic diet assessment: 3 meals a day.  2.  Staying active or exercising:   3.  Medication compliance: compliant all of the time.  # Postablative hypothyroidism -First diagnosed in 1992, after treatment for Graves' disease with RAI I-131.  She has been on relatively large dose of levothyroxine supplement.  Dose has been adjusted.  Regularly in the past.  Lately she has been on levothyroxine / Levoxyl 125 mcg : 2 tablets daily.  # She has osteopenia, last DEXA scan in February 2022, managed by primary care provider.  Interval history  Diabetes regimen as reviewed above.  She has been on Mounjaro 12.5 mg weekly since last month, denies GI upset or intolerance.  Hemoglobin A1c remained controlled with recent hemoglobin A1c of 5.2%.  Recent lab results reviewed with stable renal function, electrolytes and normal TSH of 1.24.  She has lost about 20 pounds of weight since her last visit.  She has gradually decrease Levemir, currently taking 30 to 30 units at bedtime.  No palpitation.  No hypo and hyperthyroid symptoms.  No other complaints today.  REVIEW OF SYSTEMS As per history of present illness.   PAST  MEDICAL HISTORY: Past Medical History:  Diagnosis Date   ADD (attention deficit disorder) 11/19/2012   Allergic rhinitis, cause unspecified    Anxiety state, unspecified    Arthritis    Asymptomatic postmenopausal status (age-related) (natural)    Atypical hyperplasia of left breast 2000   Atypical mole 03/16/2018   R paraspinal mid upper back, excision   Atypical mole 02/17/2018   R spinal upper back, moderate   Atypical mole 08/11/2017   L sternum, excision   Atypical mole 05/27/2017   L post neck, moderate   Benign neoplasm of other and unspecified site of the digestive system    Bipolar disorder (HCC)     Breast cancer (HCC)    Left Breast 6mm invasive CA left uiq, dcis left uoq and a lot ADH, ALH in both breasts.   CAD (coronary artery disease)    a. 08/2014 NSTEMI/Cath: LM nl, LAD 50p, D1/D2 min irregs, LCX nl, OM1 40, LPDA nl, RI 99ost (2.64mm vessel->med Rx), RCA nl, AM 60, nl EF; b. 11/2016 MV: EF 74%, no ischemia (performed 2/2 dyspnea & new inf TWI).   Carcinoma of left breast (HCC) 06/02/2014   Cataract    Chronic diastolic CHF (congestive heart failure) (HCC)    a. 08/2014 Echo: EF 60-65%, Gr 1 DD, nl LA.   Clotting disorder (HCC)    on blood thiner, Plavix   Cyst    on Achilles tendon   Depression    Diabetes mellitus (HCC)    frank   Dysuria    Edema    Family history of malignant neoplasm of gastrointestinal tract    Fibromyalgia    GERD (gastroesophageal reflux disease)    Glaucoma    pre glaucoma, 05/14/2018 pt. denies glaucoma   Headache    migraines   Hiatal hernia    History of alcoholism (HCC)    History of migraines    History of ovarian cyst    Hx of adenomatous colonic polyps    Insomnia, unspecified    Myocardial infarction (HCC) 09-04-14   OCD (obsessive compulsive disorder)    Osteopenia 09/25/2016   Femoral neck T -1.1  9/18   Other screening mammogram    Pure hypercholesterolemia    Rosacea    Subacute confusional state 11/19/2012   Tubular adenoma of colon 01/10/11   Ulcerative colitis, unspecified    Unspecified asthma(493.90)    Unspecified essential hypertension    Unspecified hypothyroidism    Unspecified vitamin D deficiency    Vertigo     PAST SURGICAL HISTORY: Past Surgical History:  Procedure Laterality Date   APPENDECTOMY     AXILLARY SENTINEL NODE BIOPSY Left 05/23/2014   Procedure: AXILLARY SENTINEL NODE BIOPSY;  Surgeon: Kieth Brightly, MD;  Location: [facility] ORS;  Service: General;  Laterality: Left;   BREAST BIOPSY  08/1998   on tamoxifen, atypical hyperplasia   BREAST BIOPSY Left 04/2014   BREAST SURGERY Left 08/1998    lumpectomy/ Dr Okey Dupre   BREAST SURGERY Bilateral 05/23/2014   Mastectomy   BUNIONECTOMY Right    CARDIAC CATHETERIZATION N/A 09/05/2014   Procedure: Left Heart Cath and Coronary Angiography;  Surgeon: Iran Ouch, MD;  Location: [facility];  Service: Cardiovascular;  Laterality: N/A;   CARDIAC CATHETERIZATION  09/05/2014   CATARACT EXTRACTION W/ INTRAOCULAR LENS IMPLANT Bilateral    CESAREAN SECTION  1996/1997   placenta previa, gest DM, pre-eclampsia   CHOLECYSTECTOMY  1997   adhesions also   COLONOSCOPY  01/2001   Ulcerative colitis   COLONOSCOPY  04/2004   UC, polyp   COLONOSCOPY  12/2006   UC, no polyps   CORONARY STENT INTERVENTION N/A 04/06/2020   Procedure: CORONARY STENT INTERVENTION;  Surgeon: Yvonne Kendall, MD;  Location: [facility];  Service: Cardiovascular;  Laterality: N/A;   DEXA  04/1999 and 2010   normal   ESOPHAGOGASTRODUODENOSCOPY  09/2001   polyp   exercise stress test  11/2003   negative   FEMUR FRACTURE SURGERY Left    LEFT HEART CATH AND CORONARY ANGIOGRAPHY N/A 04/06/2020   Procedure: LEFT HEART CATH AND CORONARY ANGIOGRAPHY;  Surgeon: Antonieta Iba, MD;  Location: [facility];  Service: Cardiovascular;  Laterality: N/A;   MASTECTOMY Bilateral    MECKEL DIVERTICULUM EXCISION  1999   NASAL SINUS SURGERY  05/1997   nuclear stress test  06/2007   negative   precancerous mole removed     RIGHT AND LEFT HEART CATH     with 1 stent placed in March 2022   SIMPLE MASTECTOMY WITH AXILLARY SENTINEL NODE BIOPSY Bilateral 05/23/2014   Procedure: Bilateral simple mastectomy, left sentinel node biopsy ;  Surgeon: Kieth Brightly, MD;  Location: [facility];  Service: General;  Laterality: Bilateral;   Sleep study  11/2007   no apnea, but did snore (done by [facility])   TONSILLECTOMY  1984   TUBAL LIGATION     WISDOM TOOTH EXTRACTION  1980's    ALLERGIES: Allergies  Allergen Reactions   Ephedrine Other (See  Comments)    Pt becomes hyper   Oxycodone Other (See Comments)    Panic Attack   Aspirin Other (See Comments)    REACTION: aggrivates colitis Tolerates 81 mg but unable to take doses >81 mg   Erythromycin Other (See Comments)    REACTION: GI upset   Nsaids     REACTION: aggrivate colitis- GI ok'd for short term use   Other Other (See Comments)    Unable to take due to history of ulcerative colitis   Rosuvastatin Other (See Comments)    REACTION: increased lfts    FAMILY HISTORY:  Family History  Problem Relation Age of Onset   Coronary artery disease Father    Colon cancer Father    Alzheimer's disease Father    Dementia Father    Colon polyps Father    Coronary artery disease Mother    Hypertension Mother    Osteoporosis Mother    Crohn's disease Mother        crohns colitis   Breast cancer Other        great aunts   Coronary artery disease Other        Uncle (also AAA)   Diabetes Other        remote family history   Stroke Cousin    Esophageal cancer Neg Hx    Rectal cancer Neg Hx    Stomach cancer Neg Hx     SOCIAL HISTORY: Social History   Socioeconomic History   Marital status: Married    Spouse name: alan   Number of children: 2   Years of education: college   Highest education level: Bachelor's degree (e.g., BA, AB, BS)  Occupational History   Occupation: unemployed  Tobacco Use   Smoking status: Former    Current packs/day: 0.00    Average packs/day: 0.3 packs/day for 5.0 years (1.3 ttl pk-yrs)    Types: Cigarettes    Start date: 01/15/1988  Quit date: 01/14/1993    Years since quitting: 29.9   Smokeless tobacco: Never  Vaping Use   Vaping status: Never Used  Substance and Sexual Activity   Alcohol use: No    Alcohol/week: 0.0 standard drinks of alcohol    Comment: Recovered ETOH   Drug use: No   Sexual activity: Not Currently    Partners: Male  Other Topics Concern   Not on file  Social History Narrative   Married      2 children 11  and 12      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      [employer] telephone triage   Social Drivers of Health   Financial Resource Strain: Low Risk  (05/20/2022)   Overall Financial Resource Strain (CARDIA)    Difficulty of Paying Living Expenses: Not very hard  Food Insecurity: No Food Insecurity (05/20/2022)   Hunger Vital Sign    Worried About Running Out of Food in the Last Year: Never true    Ran Out of Food in the Last Year: Never true  Transportation Needs: No Transportation Needs (05/20/2022)   PRAPARE - Administrator, Civil Service (Medical): No    Lack of Transportation (Non-Medical): No  Physical Activity: Insufficiently Active (05/20/2022)   Exercise Vital Sign    Days of Exercise per Week: 2 days    Minutes of Exercise per Session: 30 min  Stress: No Stress Concern Present (05/20/2022)   [employer] of Occupational Health - Occupational Stress Questionnaire    Feeling of Stress : Only a little  Social Connections: Socially Integrated (05/20/2022)   Social Connection and Isolation Panel [NHANES]    Frequency of Communication with Friends and Family: More than three times a week    Frequency of Social Gatherings with Friends and Family: More than three times a week    Attends Religious Services: More than 4 times per year    Active Member of [employer] or Organizations: Yes    Attends Engineer, structural: More than 4 times per year    Marital Status: Married    MEDICATIONS:  Current Outpatient Medications  Medication Sig Dispense Refill   acetaminophen (TYLENOL) 650 MG CR tablet Take 1,300 mg by mouth 2 (two) times daily.     albuterol (VENTOLIN HFA) 108 (90 Base) MCG/ACT inhaler INHALE 1 PUFF INTO THE LUNGS EVERY 4 HOURS AS NEEDED FOR WHEEZING     amantadine (SYMMETREL) 100 MG capsule Take 100 mg by mouth 2 (two) times daily.     Ascorbic Acid (VITAMIN C PO) Take 1,000 mg by mouth daily.     aspirin 81 MG chewable tablet Chew 1  tablet (81 mg total) by mouth daily. 90 tablet 0   atorvastatin (LIPITOR) 40 MG tablet TAKE 1 TABLET BY MOUTH ONCE DAILY 90 tablet 3   B Complex Vitamins (VITAMIN B COMPLEX PO) Take 1 tablet by mouth every other day.     BAYER MICROLET LANCETS lancets USE AS DIRECTED TO CHECK BLOOD SUGAR TWICE DAILY 200 each 2   Blood Glucose Monitoring Suppl (CONTOUR NEXT MONITOR) w/Device KIT 1 each by Does not apply route in the morning and at bedtime. Use Contour Next meter to check blood sugar twice daily. 1 kit 0   Calcium Carb-Cholecalciferol (CALTRATE 600+D3 PO) Take 1 tablet by mouth daily. 300 mg Vitamin D3     Co-Enzyme Q-10 100 MG CAPS Take 100 mg by mouth daily.  cyclobenzaprine (FLEXERIL) 5 MG tablet TAKE 1 TABLET BY MOUTH EVERY 8 HOURS AS NEEDED FOR MUSCLE SPASMS (BACK PAIN) 30 tablet 1   dextroamphetamine (DEXEDRINE SPANSULE) 15 MG 24 hr capsule Take 30 mg by mouth every morning and 15 mg at noon as needed     diazepam (VALIUM) 10 MG tablet Take 2.5-7.5 mg by mouth daily as needed for anxiety.     docusate sodium (COLACE) 100 MG capsule Take 100-300 mg by mouth daily as needed for mild constipation or moderate constipation.     famotidine (PEPCID) 40 MG tablet Take 40 mg by mouth 2 (two) times daily. 30 min. Before meal and at bedtime     fluticasone (FLONASE) 50 MCG/ACT nasal spray Place 1 spray into both nostrils daily. 16 g 6   gabapentin (NEURONTIN) 300 MG capsule TAKE 1 CAPSULE BY MOUTH 3 TIMES DAILY 270 capsule 1   glucose blood (CONTOUR NEXT TEST) test strip USE AS DIRECTED. 100 each 5   insulin degludec (TRESIBA FLEXTOUCH) 100 UNIT/ML FlexTouch Pen Inject 15 Units into the skin daily. 15 mL 4   Insulin Pen Needle (B-D UF III MINI PEN NEEDLES) 31G X 5 MM MISC USE FOR INJECTIONS TWICE DAILY 150 each 3   JARDIANCE 25 MG TABS tablet TAKE 1 TABLET BY MOUTH ONCE DAILY 90 tablet 3   ketoconazole (NIZORAL) 2 % cream Apply to affected areas one to two times daily as needed. 30 g 11   KRILL OIL PO  Take by mouth.     lamoTRIgine (LAMICTAL) 200 MG tablet Take 400 mg by mouth daily.     latanoprost (XALATAN) 0.005 % ophthalmic solution Place 1 drop into both eyes 2 (two) times a week.      levothyroxine (LEVOXYL) 125 MCG tablet Take 2 tablets by mouth daily 180 tablet 2   loperamide (IMODIUM A-D) 2 MG tablet Take 2 mg by mouth daily as needed for diarrhea or loose stools.     Melatonin 10 MG CAPS Take 10 mg by mouth at bedtime.     mesalamine (LIALDA) 1.2 g EC tablet TAKE 2 TABLETS BY MOUTH ONCE DAILY 180 tablet 1   metoprolol tartrate (LOPRESSOR) 25 MG tablet Take 1 tablet (25 mg total) by mouth 2 (two) times daily as needed (for tachycardia). 60 tablet 3   Multiple Vitamin (MULTIVITAMIN) tablet Take 1 tablet by mouth daily. Woman 50 plus     nitroGLYCERIN (NITROSTAT) 0.4 MG SL tablet Place 1 tablet (0.4 mg total) under the tongue every 5 (five) minutes as needed for chest pain. 25 tablet 3   ondansetron (ZOFRAN-ODT) 4 MG disintegrating tablet DISSOLVE 1 TABLET ON THE TONGUE EVERY 6 HOURS AS NEEDED FOR NAUSEA OR VOMIT 90 tablet 1   pantoprazole (PROTONIX) 40 MG tablet TAKE 1 TABLET BY MOUTH TWICE DAILY 180 tablet 1   polyethylene glycol (MIRALAX / GLYCOLAX) 17 g packet Take 17 g by mouth 3 (three) times daily as needed for moderate constipation, mild constipation or severe constipation. In water or beverage     Probiotic Product (CULTURELLE PROBIOTICS PO) Take 1 capsule by mouth daily. For woman     QUEtiapine (SEROQUEL XR) 400 MG 24 hr tablet Take 800 mg by mouth at bedtime.     simethicone (MYLICON) 80 MG chewable tablet Chew 80 mg by mouth 4 (four) times daily.     tirzepatide (MOUNJARO) 15 MG/0.5ML Pen Inject 15 mg into the skin once a week. 6 mL 4   valACYclovir (VALTREX) 1000  MG tablet TAKE 1 TABLET BY MOUTH ONCE DAILY, take one tablet po bid x 5 days prn outbreak 90 tablet 3   VRAYLAR capsule Take 3 mg by mouth at bedtime.     zaleplon (SONATA) 10 MG capsule Take 10-20 mg by mouth at  bedtime.     Evolocumab (REPATHA SURECLICK) 140 MG/ML SOAJ INJECT 140MG  SUBCUTANEOUSLY EVERY 2 WEEKS 6 mL 3   No current facility-administered medications for this visit.    PHYSICAL EXAM: Vitals:   12/31/22 1307  BP: 136/88  Pulse: 85  Resp: 20  SpO2: 99%  Weight: 169 lb 12.8 oz (77 kg)  Height: 5\' 7"  (1.702 m)   Body mass index is 26.59 kg/m.  Wt Readings from Last 3 Encounters:  12/31/22 169 lb 12.8 oz (77 kg)  09/09/22 187 lb (84.8 kg)  08/21/22 187 lb (84.8 kg)    General: Well developed, well nourished female in no apparent distress.  HEENT: AT/[place], no external lesions.  Eyes: Conjunctiva clear and no icterus. Neck: Neck supple  Lungs: Respirations not labored Neurologic: Alert, oriented, normal speech Extremities / Skin: Dry. No sores or rashes noted.  Psychiatric: Does not appear depressed or anxious  Diabetic Foot Exam - Simple   Simple Foot Form Diabetic Foot exam was performed with the following findings: Yes 12/31/2022  1:29 PM  Visual Inspection See comments: Yes Sensation Testing Intact to touch and monofilament testing bilaterally: Yes Pulse Check See comments: Yes Comments DP palpable bilaterally. Mild callus on left foot.      LABS Reviewed Lab Results  Component Value Date   HGBA1C 5.2 12/20/2022   HGBA1C 5.1 08/21/2022   HGBA1C 5.2 04/22/2022   Lab Results  Component Value Date   FRUCTOSAMINE 260 02/07/2020   FRUCTOSAMINE 220 01/25/2015   Lab Results  Component Value Date   CHOL 80 08/21/2022   HDL 50.00 08/21/2022   LDLCALC 14 08/21/2022   LDLDIRECT 84.0 12/04/2021   TRIG 81.0 08/21/2022   CHOLHDL 2 08/21/2022   Lab Results  Component Value Date   MICRALBCREAT 2.2 04/29/2022   MICRALBCREAT 2.6 10/12/2020   Lab Results  Component Value Date   CREATININE 0.93 12/20/2022   Lab Results  Component Value Date   GFR 65.00 12/20/2022    ASSESSMENT / PLAN  1. Controlled type 2 diabetes mellitus with complication, with  long-term current use of insulin (HCC)   2. Postablative hypothyroidism   3. Mixed hyperlipidemia     Diabetes Mellitus type 2, complicated by CAD. - Diabetic status / severity: Controlled.  Lab Results  Component Value Date   HGBA1C 5.2 12/20/2022    - Hemoglobin A1c goal : <7%  Will maximize the dose of Mounjaro and gradually decreased basal insulin.    - Medications:  Diabetes regimen: Increase Mounjaro to 15 mg weekly. Decrease insulin and changed to [place] 15 units daily at bedtime. Continue Jardiance 25 mg daily.  - Home glucose testing: Check at least twice a day in the morning fasting and at bedtime.  She declined continuous glucose monitor. - Discussed/ Gave Hypoglycemia treatment plan.  # Consult : not required at this time.   # Annual urine for microalbuminuria/ creatinine ratio, no microalbuminuria currently. Last  Lab Results  Component Value Date   MICRALBCREAT 2.2 04/29/2022    # Foot check nightly.  # Annual dilated diabetic eye exams.  Advised to have diabetic eye exam.  - Diet: Make healthy diabetic food choices - Life style / activity /  exercise: Discussed.  2. Blood pressure  -  BP Readings from Last 1 Encounters:  12/31/22 136/88    - Control is in target.  - No change in current plans.  3. Lipid status / Hyperlipidemia - Last  Lab Results  Component Value Date   LDLCALC 14 08/21/2022   - Continue atorvastatin 40 mg daily.  Repatha.  Managed by cardiology.  # Postablative hypothyroidism -Continue Levoxyl 250 mcg daily.  Diagnoses and all orders for this visit:  Controlled type 2 diabetes mellitus with complication, with long-term current use of insulin (HCC) -     Microalbumin / creatinine urine ratio -     Hemoglobin A1c -     BASIC METABOLIC PANEL WITH GFR  Postablative hypothyroidism  Mixed hyperlipidemia -     Evolocumab (REPATHA SURECLICK) 140 MG/ML SOAJ; INJECT 140MG  SUBCUTANEOUSLY EVERY 2 WEEKS  Other orders -      tirzepatide (MOUNJARO) 15 MG/0.5ML Pen; Inject 15 mg into the skin once a week. -     insulin degludec (TRESIBA FLEXTOUCH) 100 UNIT/ML FlexTouch Pen; Inject 15 Units into the skin daily.    DISPOSITION Follow up in clinic in 4 months suggested.   All questions answered and patient verbalized understanding of the plan.  Lindsay Geni Skorupski, MD [facility] Endocrinology [facility] [address] [place], [address] [place], [place] [address] Phone # [phone]  At least part of this note was generated using voice recognition software. Inadvertent word errors may have occurred, which were not recognized during the proofreading process.

## 2022-12-31 NOTE — Patient Instructions (Addendum)
Diabetes regimen: Increase Mounjaro to 15 mg weekly. Decrease insulin and changed to [place] 15 units daily at bedtime. Continue Jardiance 25 mg daily.  Please have diabetic eye exam.

## 2023-01-03 ENCOUNTER — Other Ambulatory Visit: Payer: Self-pay | Admitting: Internal Medicine

## 2023-01-11 ENCOUNTER — Other Ambulatory Visit: Payer: Self-pay | Admitting: Family Medicine

## 2023-03-31 ENCOUNTER — Other Ambulatory Visit (HOSPITAL_COMMUNITY): Payer: Self-pay

## 2023-03-31 ENCOUNTER — Telehealth: Payer: Self-pay

## 2023-03-31 NOTE — Telephone Encounter (Signed)
 Pharmacy Patient Advocate Encounter   Received notification from CoverMyMeds that prior authorization for Repatha is required/requested.   Insurance verification completed.   The patient is insured through [facility] .   Per test claim: PA required; PA submitted to above mentioned insurance via CoverMyMeds Key/confirmation #/EOC [id number] Status is pending

## 2023-04-02 NOTE — Telephone Encounter (Signed)
 Pharmacy Patient Advocate Encounter  Received notification from [facility] that Prior Authorization for Repatha SureClick 140MG /ML auto-injectors has been DENIED.  Full denial letter will be uploaded to the media tab. See denial reason below.   PA #/Case ID/Reference #: [id number]

## 2023-04-04 ENCOUNTER — Encounter: Payer: Self-pay | Admitting: Family Medicine

## 2023-04-04 NOTE — Telephone Encounter (Signed)
 I spoke with pt; pt said on 04/02/23 she went to turn in bed while laying on her back and twisted her upper body.. on and off pt has mid to lower back discomfort and it is positional has sharp quick pain on rt side of med and lower back. Pt said more of a pulling sensation rather than a pain. Pt said has had this before and heat and flexeril helped. Pt is using heating pad and taking flexeril 5 mg at night.Marland Kitchenoffered pt appt for this afternoon and pt has to take her husband to appt. Pt said no UTI symptom,s, no CP or SOB. Pt said she hopes heat and flexeril will take care of this but if not pt will call Mon morning for appt. UC & ED precautions given and pt voiced understanding. Sending note to Dr Milinda Antis who is out of office and Dr Reece Agar who is in office.

## 2023-04-04 NOTE — Telephone Encounter (Signed)
 PCP not in office will route to Triage

## 2023-04-07 ENCOUNTER — Encounter: Payer: Self-pay | Admitting: Endocrinology

## 2023-04-07 ENCOUNTER — Ambulatory Visit (INDEPENDENT_AMBULATORY_CARE_PROVIDER_SITE_OTHER): Payer: Medicare Other | Admitting: Endocrinology

## 2023-04-07 VITALS — BP 100/60 | HR 71 | Resp 20 | Ht 67.0 in | Wt 159.8 lb

## 2023-04-07 DIAGNOSIS — Z794 Long term (current) use of insulin: Secondary | ICD-10-CM | POA: Diagnosis not present

## 2023-04-07 DIAGNOSIS — E118 Type 2 diabetes mellitus with unspecified complications: Secondary | ICD-10-CM

## 2023-04-07 DIAGNOSIS — E890 Postprocedural hypothyroidism: Secondary | ICD-10-CM

## 2023-04-07 LAB — POCT GLYCOSYLATED HEMOGLOBIN (HGB A1C): Hemoglobin A1C: 4.8 % (ref 4.0–5.6)

## 2023-04-07 MED ORDER — CONTOUR NEXT TEST VI STRP: ORAL_STRIP | 5 refills | Status: DC

## 2023-04-07 NOTE — Patient Instructions (Signed)
 Diabetes regimen: Continue Mounjaro 15 mg weekly. Continue Jardiance 25 mg daily.  Stop tresiba    Please have diabetic eye exam.  If fasting glucose > 130 persistently, call our clinic.

## 2023-04-07 NOTE — Progress Notes (Signed)
 Outpatient Endocrinology Note [place] Ananda Sitzer, MD  04/07/23  Patient's Name: Lindsay Fry    DOB: 1960-06-10    MRN: [id number]                                                    REASON OF VISIT: Follow up for type 2 diabetes mellitus  PCP: Tower, Audrie Gallus, MD  HISTORY OF PRESENT ILLNESS:   Lindsay Fry is a 63 y.o. old female with past medical history listed below, is here for follow up for type 2 diabetes mellitus.   Pertinent Diabetes History: Patient was previously seen by Dr. Lucianne Muss and was last time seen in August 2024.  Patient was diagnosed with type 2 diabetes mellitus in March 2016.  She has been on various antidiabetic medications.  Insulin therapy was started in August 2017.  Patient has controlled type 2 diabetes mellitus.  Chronic Diabetes Complications : Retinopathy: no. Last ophthalmology exam was done on DUE.  Nephropathy: no Peripheral neuropathy: no.  Patient has been on gabapentin for fibromyalgia. Coronary artery disease: yes Stroke: no  Relevant comorbidities and cardiovascular risk factors: Obesity: no Body mass index is 25.03 kg/m.  Hypertension: Yes  Hyperlipidemia : Yes, on statin.  She is on Repatha.  Current / Home Diabetic regimen includes:  Tresiba 15 units daily at night. Mounjaro 15 mg weekly. Jardiance 25 mg daily    Prior diabetic medications: Metformin ER 2000 mg daily, stopped due to diarrhea.  She used to be on Janumet, Victoza in the past.  Ozempic was changed to [facility].  Levemir was changed to [place] in 2024.  Glycemic data:   Ascensia Contour next 1 glucometer data from March 10 to March 28, 2022 downloaded and reviewed, average blood sugar 89.  She has been checking mostly once a day in the morning fasting, lowest blood sugar 78 and highest 103.  She declined continuous glucose monitoring.  Hypoglycemia: Patient has minor /occasional hypoglycemic episodes. Patient has hypoglycemia awareness\.  Factors modifying glucose  control: 1.  Diabetic diet assessment: 3 meals a day.  2.  Staying active or exercising:   3.  Medication compliance: compliant all of the time.  # Postablative hypothyroidism -First diagnosed in 1992, after treatment for Graves' disease with RAI I-131.  She has been on relatively large dose of levothyroxine supplement.  Dose has been adjusted.  Regularly in the past.  Lately she has been on levothyroxine / Levoxyl 125 mcg : 2 tablets daily.  # She has osteopenia, last DEXA scan in February 2022, managed by primary care provider.  Interval history  Glucometer data as reviewed above.  She has been taking [place] 15 units daily.  Denies hypoglycemic symptoms.  Hemoglobin A1c 4.8% today.  She has been tolerating Mounjaro well.  Today she has cold symptoms and feeling of lightheaded, denies hypoglycemic symptoms.  She has lost about 20 pounds of weight in last 4 to 5 months.  She has been taking levothyroxine 250 mcg daily.  Denies palpitation or heat intolerance.  She had normal thyroid function test in December.  No other complaints today.  She is accompanied by her husband in the clinic today.  REVIEW OF SYSTEMS As per history of present illness.   PAST MEDICAL HISTORY: Past Medical History:  Diagnosis Date   ADD (attention deficit disorder) 11/19/2012  Allergic rhinitis, cause unspecified    Anxiety state, unspecified    Arthritis    Asymptomatic postmenopausal status (age-related) (natural)    Atypical hyperplasia of left breast 2000   Atypical mole 03/16/2018   R paraspinal mid upper back, excision   Atypical mole 02/17/2018   R spinal upper back, moderate   Atypical mole 08/11/2017   L sternum, excision   Atypical mole 05/27/2017   L post neck, moderate   Benign neoplasm of other and unspecified site of the digestive system    Bipolar disorder (HCC)    Breast cancer (HCC)    Left Breast 6mm invasive CA left uiq, dcis left uoq and a lot ADH, ALH in both breasts.   CAD  (coronary artery disease)    a. 08/2014 NSTEMI/Cath: LM nl, LAD 50p, D1/D2 min irregs, LCX nl, OM1 40, LPDA nl, RI 99ost (2.25mm vessel->med Rx), RCA nl, AM 60, nl EF; b. 11/2016 MV: EF 74%, no ischemia (performed 2/2 dyspnea & new inf TWI).   Carcinoma of left breast (HCC) 06/02/2014   Cataract    Chronic diastolic CHF (congestive heart failure) (HCC)    a. 08/2014 Echo: EF 60-65%, Gr 1 DD, nl LA.   Clotting disorder (HCC)    on blood thiner, Plavix   Cyst    on Achilles tendon   Depression    Diabetes mellitus (HCC)    frank   Dysuria    Edema    Family history of malignant neoplasm of gastrointestinal tract    Fibromyalgia    GERD (gastroesophageal reflux disease)    Glaucoma    pre glaucoma, 05/14/2018 pt. denies glaucoma   Headache    migraines   Hiatal hernia    History of alcoholism (HCC)    History of migraines    History of ovarian cyst    Hx of adenomatous colonic polyps    Insomnia, unspecified    Myocardial infarction (HCC) 09-04-14   OCD (obsessive compulsive disorder)    Osteopenia 09/25/2016   Femoral neck T -1.1  9/18   Other screening mammogram    Pure hypercholesterolemia    Rosacea    Subacute confusional state 11/19/2012   Tubular adenoma of colon 01/10/11   Ulcerative colitis, unspecified    Unspecified asthma(493.90)    Unspecified essential hypertension    Unspecified hypothyroidism    Unspecified vitamin D deficiency    Vertigo     PAST SURGICAL HISTORY: Past Surgical History:  Procedure Laterality Date   APPENDECTOMY     AXILLARY SENTINEL NODE BIOPSY Left 05/23/2014   Procedure: AXILLARY SENTINEL NODE BIOPSY;  Surgeon: Kieth Brightly, MD;  Location: [facility] ORS;  Service: General;  Laterality: Left;   BREAST BIOPSY  08/1998   on tamoxifen, atypical hyperplasia   BREAST BIOPSY Left 04/2014   BREAST SURGERY Left 08/1998   lumpectomy/ Dr Okey Dupre   BREAST SURGERY Bilateral 05/23/2014   Mastectomy   BUNIONECTOMY Right    CARDIAC  CATHETERIZATION N/A 09/05/2014   Procedure: Left Heart Cath and Coronary Angiography;  Surgeon: Iran Ouch, MD;  Location: [facility] INVASIVE CV [facility];  Service: Cardiovascular;  Laterality: N/A;   CARDIAC CATHETERIZATION  09/05/2014   CATARACT EXTRACTION W/ INTRAOCULAR LENS IMPLANT Bilateral    CESAREAN SECTION  1996/1997   placenta previa, gest DM, pre-eclampsia   CHOLECYSTECTOMY  1997   adhesions also   COLONOSCOPY  01/2001   Ulcerative colitis   COLONOSCOPY  04/2004   UC, polyp   COLONOSCOPY  12/2006   UC, no polyps   CORONARY STENT INTERVENTION N/A 04/06/2020   Procedure: CORONARY STENT INTERVENTION;  Surgeon: Yvonne Kendall, MD;  Location: [facility] INV[facility];  Service: Cardiovascular;  Laterality: N/A;   DEXA  04/1999 and 2010   normal   ESOPHAGOGASTRODUODENOSCOPY  09/2001   polyp   exercise stress test  11/2003   negative   FEMUR FRACTURE SURGERY Left    LEFT HEART CATH AND CORONARY ANGIOGRAPHY N/A 04/06/2020   Procedure: LEFT HEART CATH AND CORONARY ANGIOGRAPHY;  Surgeon: Antonieta Iba, MD;  Location: [facility];  Service: Cardiovascular;  Laterality: N/A;   MASTECTOMY Bilateral    MECKEL DIVERTICULUM EXCISION  1999   NASAL SINUS SURGERY  05/1997   nuclear stress test  06/2007   negative   precancerous mole removed     RIGHT AND LEFT HEART CATH     with 1 stent placed in March 2022   SIMPLE MASTECTOMY WITH AXILLARY SENTINEL NODE BIOPSY Bilateral 05/23/2014   Procedure: Bilateral simple mastectomy, left sentinel node biopsy ;  Surgeon: Kieth Brightly, MD;  Location: [facility] ORS;  Service: General;  Laterality: Bilateral;   Sleep study  11/2007   no apnea, but did snore (done by [facility])   TONSILLECTOMY  1984   TUBAL LIGATION     WISDOM TOOTH EXTRACTION  1980's    ALLERGIES: Allergies  Allergen Reactions   Ephedrine Other (See Comments)    Pt becomes hyper   Oxycodone Other (See Comments)    Panic Attack   Aspirin Other (See Comments)     REACTION: aggrivates colitis Tolerates 81 mg but unable to take doses >81 mg   Erythromycin Other (See Comments)    REACTION: GI upset   Nsaids     REACTION: aggrivate colitis- GI ok'd for short term use   Other Other (See Comments)    Unable to take due to history of ulcerative colitis   Rosuvastatin Other (See Comments)    REACTION: increased lfts    FAMILY HISTORY:  Family History  Problem Relation Age of Onset   Coronary artery disease Father    Colon cancer Father    Alzheimer's disease Father    Dementia Father    Colon polyps Father    Coronary artery disease Mother    Hypertension Mother    Osteoporosis Mother    Crohn's disease Mother        crohns colitis   Breast cancer Other        great aunts   Coronary artery disease Other        Uncle (also AAA)   Diabetes Other        remote family history   Stroke Cousin    Esophageal cancer Neg Hx    Rectal cancer Neg Hx    Stomach cancer Neg Hx     SOCIAL HISTORY: Social History   Socioeconomic History   Marital status: Married    Spouse name: alan   Number of children: 2   Years of education: college   Highest education level: Bachelor's degree (e.g., BA, AB, BS)  Occupational History   Occupation: unemployed  Tobacco Use   Smoking status: Former    Current packs/day: 0.00    Average packs/day: 0.3 packs/day for 5.0 years (1.3 ttl pk-yrs)    Types: Cigarettes    Start date: 01/15/1988    Quit date: 01/14/1993    Years since quitting: 30.2   Smokeless tobacco: Never  Vaping Use   Vaping status: Never Used  Substance and Sexual Activity   Alcohol use: No    Alcohol/week: 0.0 standard drinks of alcohol    Comment: Recovered ETOH   Drug use: No   Sexual activity: Not Currently    Partners: Male  Other Topics Concern   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      [employer] telephone triage   Social  Drivers of Health   Financial Resource Strain: Low Risk  (05/20/2022)   Overall Financial Resource Strain (CARDIA)    Difficulty of Paying Living Expenses: Not very hard  Food Insecurity: No Food Insecurity (05/20/2022)   Hunger Vital Sign    Worried About Running Out of Food in the Last Year: Never true    Ran Out of Food in the Last Year: Never true  Transportation Needs: No Transportation Needs (05/20/2022)   PRAPARE - Administrator, Civil Service (Medical): No    Lack of Transportation (Non-Medical): No  Physical Activity: Insufficiently Active (05/20/2022)   Exercise Vital Sign    Days of Exercise per Week: 2 days    Minutes of Exercise per Session: 30 min  Stress: No Stress Concern Present (05/20/2022)   [employer] of Occupational Health - Occupational Stress Questionnaire    Feeling of Stress : Only a little  Social Connections: Socially Integrated (05/20/2022)   Social Connection and Isolation Panel [NHANES]    Frequency of Communication with Friends and Family: More than three times a week    Frequency of Social Gatherings with Friends and Family: More than three times a week    Attends Religious Services: More than 4 times per year    Active Member of [employer] or Organizations: Yes    Attends Engineer, structural: More than 4 times per year    Marital Status: Married    MEDICATIONS:  Current Outpatient Medications  Medication Sig Dispense Refill   acetaminophen (TYLENOL) 650 MG CR tablet Take 1,300 mg by mouth 2 (two) times daily.     albuterol (VENTOLIN HFA) 108 (90 Base) MCG/ACT inhaler INHALE 1 PUFF INTO THE LUNGS EVERY 4 HOURS AS NEEDED FOR WHEEZING     amantadine (SYMMETREL) 100 MG capsule Take 100 mg by mouth 2 (two) times daily.     Ascorbic Acid (VITAMIN C PO) Take 1,000 mg by mouth daily.     aspirin 81 MG chewable tablet Chew 1 tablet (81 mg total) by mouth daily. 90 tablet 0   atorvastatin (LIPITOR) 40 MG tablet TAKE 1 TABLET BY MOUTH ONCE DAILY  90 tablet 3   B Complex Vitamins (VITAMIN B COMPLEX PO) Take 1 tablet by mouth every other day.     BAYER MICROLET LANCETS lancets USE AS DIRECTED TO CHECK BLOOD SUGAR TWICE DAILY 200 each 2   Blood Glucose Monitoring Suppl (CONTOUR NEXT MONITOR) w/Device KIT 1 each by Does not apply route in the morning and at bedtime. Use Contour Next meter to check blood sugar twice daily. 1 kit 0   Calcium Carb-Cholecalciferol (CALTRATE 600+D3 PO) Take 1 tablet by mouth daily. 300 mg Vitamin D3     Co-Enzyme Q-10 100 MG CAPS Take 100 mg by mouth daily.     cyclobenzaprine (FLEXERIL) 5 MG tablet TAKE 1 TABLET BY MOUTH EVERY 8 HOURS  AS NEEDED FOR MUSCLE SPASMS (BACK PAIN) 30 tablet 1   dextroamphetamine (DEXEDRINE SPANSULE) 15 MG 24 hr capsule Take 30 mg by mouth every morning and 15 mg at noon as needed     diazepam (VALIUM) 10 MG tablet Take 2.5-7.5 mg by mouth daily as needed for anxiety.     docusate sodium (COLACE) 100 MG capsule Take 100-300 mg by mouth daily as needed for mild constipation or moderate constipation.     Evolocumab (REPATHA SURECLICK) 140 MG/ML SOAJ INJECT 140MG  SUBCUTANEOUSLY EVERY 2 WEEKS 6 mL 3   famotidine (PEPCID) 40 MG tablet Take 40 mg by mouth 2 (two) times daily. 30 min. Before meal and at bedtime     fluticasone (FLONASE) 50 MCG/ACT nasal spray Place 1 spray into both nostrils daily. 16 g 6   gabapentin (NEURONTIN) 300 MG capsule TAKE 1 CAPSULE BY MOUTH 3 TIMES DAILY 270 capsule 1   Insulin Pen Needle (B-D UF III MINI PEN NEEDLES) 31G X 5 MM MISC USE FOR INJECTIONS TWICE DAILY 150 each 3   Insulin Pen Needle (TRUEPLUS PEN NEEDLES) 32G X 4 MM MISC USE WITH LEVEMIR AT BEDTIME 100 each 3   JARDIANCE 25 MG TABS tablet TAKE 1 TABLET BY MOUTH ONCE DAILY 90 tablet 3   ketoconazole (NIZORAL) 2 % cream Apply to affected areas one to two times daily as needed. 30 g 11   KRILL OIL PO Take by mouth.     lamoTRIgine (LAMICTAL) 200 MG tablet Take 400 mg by mouth daily.     latanoprost  (XALATAN) 0.005 % ophthalmic solution Place 1 drop into both eyes 2 (two) times a week.      levothyroxine (LEVOXYL) 125 MCG tablet Take 2 tablets by mouth daily 180 tablet 2   loperamide (IMODIUM A-D) 2 MG tablet Take 2 mg by mouth daily as needed for diarrhea or loose stools.     Melatonin 10 MG CAPS Take 10 mg by mouth at bedtime.     mesalamine (LIALDA) 1.2 g EC tablet TAKE 2 TABLETS BY MOUTH ONCE DAILY 180 tablet 1   metoprolol tartrate (LOPRESSOR) 25 MG tablet Take 1 tablet (25 mg total) by mouth 2 (two) times daily as needed (for tachycardia). 60 tablet 3   Multiple Vitamin (MULTIVITAMIN) tablet Take 1 tablet by mouth daily. Woman 50 plus     nitroGLYCERIN (NITROSTAT) 0.4 MG SL tablet Place 1 tablet (0.4 mg total) under the tongue every 5 (five) minutes as needed for chest pain. 25 tablet 3   ondansetron (ZOFRAN-ODT) 4 MG disintegrating tablet DISSOLVE 1 TABLET ON THE TONGUE EVERY 6 HOURS AS NEEDED FOR NAUSEA OR VOMIT 90 tablet 1   pantoprazole (PROTONIX) 40 MG tablet TAKE 1 TABLET BY MOUTH TWICE DAILY 180 tablet 1   polyethylene glycol (MIRALAX / GLYCOLAX) 17 g packet Take 17 g by mouth 3 (three) times daily as needed for moderate constipation, mild constipation or severe constipation. In water or beverage     Probiotic Product (CULTURELLE PROBIOTICS PO) Take 1 capsule by mouth daily. For woman     QUEtiapine (SEROQUEL XR) 400 MG 24 hr tablet Take 800 mg by mouth at bedtime.     simethicone (MYLICON) 80 MG chewable tablet Chew 80 mg by mouth 4 (four) times daily.     tirzepatide (MOUNJARO) 15 MG/0.5ML Pen Inject 15 mg into the skin once a week. 6 mL 4   valACYclovir (VALTREX) 1000 MG tablet TAKE 1 TABLET BY MOUTH ONCE DAILY, take  one tablet po bid x 5 days prn outbreak 90 tablet 3   VRAYLAR capsule Take 3 mg by mouth at bedtime.     zaleplon (SONATA) 10 MG capsule Take 10-20 mg by mouth at bedtime.     glucose blood (CONTOUR NEXT TEST) test strip USE AS DIRECTED 100 each 5   No current  facility-administered medications for this visit.    PHYSICAL EXAM: Vitals:   04/07/23 1318  BP: 100/60  Pulse: 71  Resp: 20  SpO2: 98%  Weight: 159 lb 12.8 oz (72.5 kg)  Height: 5\' 7"  (1.702 m)    Body mass index is 25.03 kg/m.  Wt Readings from Last 3 Encounters:  04/07/23 159 lb 12.8 oz (72.5 kg)  12/31/22 169 lb 12.8 oz (77 kg)  09/09/22 187 lb (84.8 kg)    General: Well developed, well nourished female in no apparent distress.  HEENT: AT/[place], no external lesions.  Eyes: Conjunctiva clear and no icterus. Neck: Neck supple  Lungs: Respirations not labored Neurologic: Alert, oriented, normal speech Extremities / Skin: Dry. No sores or rashes noted.  Psychiatric: Does not appear depressed or anxious  Diabetic Foot Exam - Simple   No data filed     LABS Reviewed Lab Results  Component Value Date   HGBA1C 4.8 04/07/2023   HGBA1C 5.2 12/20/2022   HGBA1C 5.1 08/21/2022   Lab Results  Component Value Date   FRUCTOSAMINE 260 02/07/2020   FRUCTOSAMINE 220 01/25/2015   Lab Results  Component Value Date   CHOL 80 08/21/2022   HDL 50.00 08/21/2022   LDLCALC 14 08/21/2022   LDLDIRECT 84.0 12/04/2021   TRIG 81.0 08/21/2022   CHOLHDL 2 08/21/2022   Lab Results  Component Value Date   MICRALBCREAT 2.2 04/29/2022   MICRALBCREAT 2.6 10/12/2020   Lab Results  Component Value Date   CREATININE 0.93 12/20/2022   Lab Results  Component Value Date   GFR 65.00 12/20/2022    ASSESSMENT / PLAN  1. Controlled type 2 diabetes mellitus with complication, with long-term current use of insulin (HCC)    Diabetes Mellitus type 2, complicated by CAD. - Diabetic status / severity: Controlled.  Lab Results  Component Value Date   HGBA1C 4.8 04/07/2023    - Hemoglobin A1c goal : <6.5%  Controlled type 2 diabetes mellitus.  Acceptable morning fasting blood sugar.  Will have trial of stopping insulin therapy.  Recent hemoglobin A1c 4.8%.  No reported hypoglycemia  however direct potential effects of hypoglycemia with significantly low hemoglobin A1c of 4.8%.  - Medications:  Diabetes regimen: Continue Mounjaro 15 mg weekly. Continue Jardiance 25 mg daily.  Advised for well hydration. Stop [place].  - Home glucose testing: Check at least twice a day in the morning fasting and at bedtime.  - Discussed/ Gave Hypoglycemia treatment plan.  # Consult : not required at this time.   # Annual urine for microalbuminuria/ creatinine ratio, no microalbuminuria currently.  Will check today. Last  Lab Results  Component Value Date   MICRALBCREAT 2.2 04/29/2022    # Foot check nightly.  # Annual dilated diabetic eye exams.  Advised to have diabetic eye exam.  - Diet: Make healthy diabetic food choices - Life style / activity / exercise: Discussed.  2. Blood pressure  -  BP Readings from Last 1 Encounters:  04/07/23 100/60    - Control is in target.  - No change in current plans.  3. Lipid status / Hyperlipidemia - Last  Lab  Results  Component Value Date   LDLCALC 14 08/21/2022   - Continue atorvastatin 40 mg daily and Repatha.  Managed by cardiology.  Patient reports Repatha was declined, advised to talk with cardiology for ongoing treatment including with Repatha.  # Postablative hypothyroidism -Continue Levoxyl 250 mcg daily.  Diagnoses and all orders for this visit:  Controlled type 2 diabetes mellitus with complication, with long-term current use of insulin (HCC) -     POCT glycosylated hemoglobin (Hb A1C) -     Microalbumin / creatinine urine ratio -     glucose blood (CONTOUR NEXT TEST) test strip; USE AS DIRECTED    DISPOSITION Follow up in clinic in 3 months suggested.   All questions answered and patient verbalized understanding of the plan.  [place] Shaindy Reader, MD [facility] [address] [place], [address] [place], [place] [address] Phone # [phone]  At least part of this note was  generated using voice recognition software. Inadvertent word errors may have occurred, which were not recognized during the proofreading process.

## 2023-04-08 LAB — MICROALBUMIN / CREATININE URINE RATIO
Creatinine, Urine: 67 mg/dL (ref 20–275)
Microalb Creat Ratio: 19 mg/g{creat} (ref <30)
Microalb, Ur: 1.3 mg/dL

## 2023-04-08 NOTE — Addendum Note (Signed)
 Addended by: Rishith Siddoway, [place] on: 04/08/2023 02:16 PM   Modules accepted: Orders

## 2023-04-10 ENCOUNTER — Ambulatory Visit: Admitting: Family Medicine

## 2023-04-10 ENCOUNTER — Encounter: Payer: Self-pay | Admitting: Family Medicine

## 2023-04-10 ENCOUNTER — Ambulatory Visit

## 2023-04-10 VITALS — BP 102/78 | HR 86 | Temp 97.9°F | Resp 20 | Ht 67.0 in | Wt 159.1 lb

## 2023-04-10 DIAGNOSIS — R52 Pain, unspecified: Secondary | ICD-10-CM

## 2023-04-10 DIAGNOSIS — U071 COVID-19: Secondary | ICD-10-CM

## 2023-04-10 DIAGNOSIS — J45909 Unspecified asthma, uncomplicated: Secondary | ICD-10-CM

## 2023-04-10 LAB — POC INFLUENZA A&B (BINAX/QUICKVUE)
Influenza A, POC: NEGATIVE
Influenza B, POC: NEGATIVE

## 2023-04-10 LAB — POC COVID19 BINAXNOW: SARS Coronavirus 2 Ag: POSITIVE — AB

## 2023-04-10 NOTE — Patient Instructions (Addendum)
 It was a pleasure meeting you today. Thank you for allowing me to take part in your health care.  Our goals for today as we discussed include:   Symptomatic management for fever, muscle aches and headaches  -Tylenol 325-500 mg every 6 hours as needed  Stay well hydrated Rest as needed with frequent repositioning and ambulation.  Increase activity as soon as tolerated to help with recovery.  Continue wearing masks, hand washing and self isolation until symptom free.  If you have worsening symptoms, especially difficulty breathing please call 911 or have someone take you to the emergency department.   You can take Tylenol and/or Ibuprofen as needed for fever reduction and pain relief.   For cough: honey 1/2 to 1 teaspoon (you can dilute the honey in water or another fluid).  You can also use guaifenesin and dextromethorphan for cough. You can use a humidifier for chest congestion and cough.  If you don't have a humidifier, you can sit in the bathroom with the hot shower running.      For sore throat: try warm salt water gargles, cepacol lozenges, throat spray, warm tea or water with lemon/honey, popsicles or ice, or OTC cold relief medicine for throat discomfort.   For congestion: take a daily anti-histamine like Zyrtec, Claritin, and a oral decongestant, such as pseudoephedrine.  You can also use Flonase 1-2 sprays in each nostril daily.   It is important to stay hydrated: drink plenty of fluids (water, gatorade/powerade/pedialyte, juices, or teas) to keep your throat moisturized and help further relieve irritation/discomfort.    This is a list of the screening recommended for you and due dates:  Health Maintenance  Topic Date Due   Medicare Annual Wellness Visit  Never done   Zoster (Shingles) Vaccine (1 of 2) Never done   Pneumococcal Vaccination (2 of 2 - PCV) 10/15/2017   DTaP/Tdap/Td vaccine (3 - Td or Tdap) 12/04/2020   Eye exam for diabetics  01/23/2021   Pap with HPV  screening  01/30/2022   Flu Shot  04/14/2023*   COVID-19 Vaccine (3 - Pfizer risk series) 09/08/2028*   Hemoglobin A1C  10/08/2023   Yearly kidney function blood test for diabetes  12/20/2023   Complete foot exam   12/31/2023   Yearly kidney health urinalysis for diabetes  04/06/2024   Colon Cancer Screening  04/18/2024   Hepatitis C Screening  Completed   HIV Screening  Completed   HPV Vaccine  Aged Out  *Topic was postponed. The date shown is not the original due date.      If you have any questions or concerns, please do not hesitate to call the office at [phone].  I look forward to our next visit and until then take care and stay safe.  Regards,   Dana Allan, MD   [facility]

## 2023-04-10 NOTE — Progress Notes (Signed)
 SUBJECTIVE:   Chief Complaint  Patient presents with   Cough    X 1 week   Generalized Body Aches   Fatigue   HPI Presents for acute visit  Discussed the use of AI scribe software for clinical note transcription with the patient, who gave verbal consent to proceed.  History of Present Illness Lindsay HAFF "Macon Large" is a 63 year old female with asthma who presents with nasal drainage and respiratory symptoms.  She has been experiencing copious clear nasal drainage for the past week, which has progressively become darker and green in color, with a small amount of blood noted this morning. She has been using a nasal irrigation device, Flonase, and occasionally Afrin for congestion, which she uses once every twelve hours or sometimes once a day for three days followed by a break. No fever, headaches, nasal pressure, or ear pain, but she has body aches, fatigue, and sleepiness.  A cough started a couple of days ago, which is bothersome when lying down. She occasionally experiences wheezing, particularly in the right lower lung, which she has been informed about by healthcare providers in the past. She has mild asthma but has not used her inhaler recently.  She was exposed to illness through her son's girlfriend, who works in an [employer] school and had passed the illness to her son and subsequently to her. She was exposed approximately two weeks ago.     PERTINENT PMH / PSH: As above  OBJECTIVE:  BP 102/78   Pulse 86   Temp 97.9 F (36.6 C)   Resp 20   Ht 5\' 7"  (1.702 m)   Wt 159 lb 2 oz (72.2 kg)   LMP 08/23/2006 (Approximate) Comment: tubal ligation  SpO2 97%   BMI 24.92 kg/m    Physical Exam Vitals reviewed.  Constitutional:      General: She is not in acute distress.    Appearance: She is not ill-appearing.  HENT:     Head: Normocephalic.     Right Ear: Tympanic membrane, ear canal and external ear normal.     Left Ear: Tympanic membrane, ear canal and  external ear normal.     Nose: Congestion present.     Mouth/Throat:     Mouth: Mucous membranes are moist.  Eyes:     Extraocular Movements: Extraocular movements intact.     Conjunctiva/sclera: Conjunctivae normal.     Pupils: Pupils are equal, round, and reactive to light.  Neck:     Vascular: No carotid bruit.  Cardiovascular:     Rate and Rhythm: Normal rate and regular rhythm.     Pulses: Normal pulses.     Heart sounds: Normal heart sounds.  Pulmonary:     Effort: Pulmonary effort is normal.     Breath sounds: Rhonchi present.  Abdominal:     General: There is no distension.     Tenderness: There is no abdominal tenderness. There is no right CVA tenderness, left CVA tenderness, guarding or rebound.  Musculoskeletal:        General: Normal range of motion.     Cervical back: Normal range of motion.     Right lower leg: No edema.     Left lower leg: No edema.  Lymphadenopathy:     Cervical: No cervical adenopathy.  Skin:    Capillary Refill: Capillary refill takes less than 2 seconds.  Neurological:     General: No focal deficit present.     Mental Status: She is  alert and oriented to person, place, and time. Mental status is at baseline.     Motor: No weakness.  Psychiatric:        Mood and Affect: Mood normal.        Behavior: Behavior normal.        Thought Content: Thought content normal.        Judgment: Judgment normal.           04/10/2023    2:09 PM 08/13/2022   11:35 AM 05/21/2022   11:15 AM 04/29/2022    3:32 PM 03/05/2022    3:06 PM  Depression screen PHQ 2/9  Decreased Interest 0 0 0 0 0  Down, Depressed, Hopeless 0 0 0 0 0  PHQ - 2 Score 0 0 0 0 0  Altered sleeping 0 0 0 0 0  Tired, decreased energy 0 1 1 1  0  Change in appetite 0 0 0 0 0  Feeling bad or failure about yourself  0 1 1 0 0  Trouble concentrating 0 0 0 0 0  Moving slowly or fidgety/restless 0 0 0 0 0  Suicidal thoughts 0 0 0 0 0  PHQ-9 Score 0 2 2 1  0  Difficult doing work/chores  Not difficult at all Not difficult at all Somewhat difficult Not difficult at all Not difficult at all      04/10/2023    2:09 PM 08/13/2022   11:36 AM 05/21/2022   11:16 AM 04/29/2022    3:33 PM  GAD 7 : Generalized Anxiety Score  Nervous, Anxious, on Edge 0 0 0 0  Control/stop worrying 0 0 0 0  Worry too much - different things 0 0 0 0  Trouble relaxing 0 0 0 0  Restless 0 0 0 0  Easily annoyed or irritable 0 0 1 0  Afraid - awful might happen 0 0 0 0  Total GAD 7 Score 0 0 1 0  Anxiety Difficulty Not difficult at all Not difficult at all Not difficult at all Not difficult at all    ASSESSMENT/PLAN:  Lab test positive for detection of COVID-19 virus Assessment & Plan: Tested positive for COVID-19 with symptoms including nasal drainage, body aches, fatigue, and cough. Antiviral treatment not initiated due to timing. - Order chest x-ray to assess lung condition and compare with previous imaging. - Has albuterol inhaler for as needed for wheezing. - Instruct to monitor symptoms and seek emergency care if respiratory symptoms worsen.   Orders: -     DG Chest 2 View; Future -     CBC with Differential/Platelet -     Comprehensive metabolic panel with GFR  Generalized body aches -     POC COVID-19 BinaxNow -     POC Influenza A&B(BINAX/QUICKVUE)  Asthma, unspecified asthma severity, unspecified whether complicated, unspecified whether persistent Assessment & Plan: Mild asthma may contribute to respiratory symptoms. Inhaler use may benefit current symptoms. - Advise use of albuterol inhaler as needed for wheezing.     PDMP reviewed  Return if symptoms worsen or fail to improve, for PCP.  Dana Allan, MD

## 2023-04-11 ENCOUNTER — Other Ambulatory Visit: Payer: Self-pay | Admitting: Internal Medicine

## 2023-04-11 ENCOUNTER — Encounter: Payer: Self-pay | Admitting: Endocrinology

## 2023-04-11 LAB — COMPREHENSIVE METABOLIC PANEL WITH GFR
ALT: 15 U/L (ref 0–35)
AST: 22 U/L (ref 0–37)
Albumin: 4.3 g/dL (ref 3.5–5.2)
Alkaline Phosphatase: 97 U/L (ref 39–117)
BUN: 11 mg/dL (ref 6–23)
CO2: 27 meq/L (ref 19–32)
Calcium: 9.7 mg/dL (ref 8.4–10.5)
Chloride: 105 meq/L (ref 96–112)
Creatinine, Ser: 0.93 mg/dL (ref 0.40–1.20)
GFR: 64.86 mL/min (ref >60.00)
Glucose, Bld: 86 mg/dL (ref 70–99)
Potassium: 4.3 meq/L (ref 3.5–5.1)
Sodium: 142 meq/L (ref 135–145)
Total Bilirubin: 0.4 mg/dL (ref 0.2–1.2)
Total Protein: 7 g/dL (ref 6.0–8.3)

## 2023-04-11 LAB — CBC WITH DIFFERENTIAL/PLATELET
Basophils Absolute: 0.1 10*3/uL (ref 0.0–0.1)
Basophils Relative: 1.2 % (ref 0.0–3.0)
Eosinophils Absolute: 0.3 10*3/uL (ref 0.0–0.7)
Eosinophils Relative: 3.4 % (ref 0.0–5.0)
HCT: 47 % — ABNORMAL HIGH (ref 36.0–46.0)
Hemoglobin: 15.7 g/dL — ABNORMAL HIGH (ref 12.0–15.0)
Lymphocytes Relative: 26.3 % (ref 12.0–46.0)
Lymphs Abs: 2.1 10*3/uL (ref 0.7–4.0)
MCHC: 33.3 g/dL (ref 30.0–36.0)
MCV: 94 fl (ref 78.0–100.0)
Monocytes Absolute: 0.9 10*3/uL (ref 0.1–1.0)
Monocytes Relative: 10.6 % (ref 3.0–12.0)
Neutro Abs: 4.8 10*3/uL (ref 1.4–7.7)
Neutrophils Relative %: 58.5 % (ref 43.0–77.0)
Platelets: 355 10*3/uL (ref 150.0–400.0)
RBC: 5 Mil/uL (ref 3.87–5.11)
RDW: 12.9 % (ref 11.5–15.5)
WBC: 8.1 10*3/uL (ref 4.0–10.5)

## 2023-04-11 MED ORDER — CONTOUR NEXT TEST VI STRP: ORAL_STRIP | 5 refills | Status: DC

## 2023-04-11 NOTE — Telephone Encounter (Signed)
 This patient has tested positive for COVID, do you recommend anything in Dr. Erroll Luna absence?    (Her Test strips have already been sent)

## 2023-04-18 ENCOUNTER — Encounter: Payer: Self-pay | Admitting: Family Medicine

## 2023-04-18 DIAGNOSIS — R52 Pain, unspecified: Secondary | ICD-10-CM | POA: Insufficient documentation

## 2023-04-18 DIAGNOSIS — U071 COVID-19: Secondary | ICD-10-CM | POA: Insufficient documentation

## 2023-04-18 NOTE — Assessment & Plan Note (Signed)
 Tested positive for COVID-19 with symptoms including nasal drainage, body aches, fatigue, and cough. Antiviral treatment not initiated due to timing. - Order chest x-ray to assess lung condition and compare with previous imaging. - Has albuterol inhaler for as needed for wheezing. - Instruct to monitor symptoms and seek emergency care if respiratory symptoms worsen.

## 2023-04-18 NOTE — Assessment & Plan Note (Signed)
 Mild asthma may contribute to respiratory symptoms. Inhaler use may benefit current symptoms. - Advise use of albuterol inhaler as needed for wheezing.

## 2023-04-25 ENCOUNTER — Ambulatory Visit: Payer: Self-pay

## 2023-04-25 ENCOUNTER — Ambulatory Visit (INDEPENDENT_AMBULATORY_CARE_PROVIDER_SITE_OTHER): Admitting: General Practice

## 2023-04-25 ENCOUNTER — Other Ambulatory Visit: Payer: Self-pay | Admitting: Family Medicine

## 2023-04-25 ENCOUNTER — Encounter: Payer: Self-pay | Admitting: General Practice

## 2023-04-25 VITALS — BP 132/86 | HR 92 | Temp 98.2°F | Ht 67.0 in | Wt 157.0 lb

## 2023-04-25 DIAGNOSIS — J209 Acute bronchitis, unspecified: Secondary | ICD-10-CM | POA: Diagnosis not present

## 2023-04-25 MED ORDER — PROMETHAZINE-DM 6.25-15 MG/5ML PO SYRP: 5.0000 mL | ORAL_SOLUTION | Freq: Four times a day (QID) | ORAL | 0 refills | Status: DC | PRN

## 2023-04-25 MED ORDER — PREDNISONE 20 MG PO TABS
40.0000 mg | ORAL_TABLET | Freq: Every day | ORAL | 0 refills | Status: AC
Start: 2023-04-25 — End: 2023-04-30

## 2023-04-25 MED ORDER — DOXYCYCLINE HYCLATE 100 MG PO TABS: 100.0000 mg | ORAL_TABLET | Freq: Two times a day (BID) | ORAL | 0 refills | Status: AC

## 2023-04-25 NOTE — Patient Instructions (Signed)
 Start Doxycycline antibiotic for the infection. Take 1 tablet by mouth twice daily for 10 days.  Start prednisone 20 mg tablets. Take 2 tablets by mouth once daily in the morning for 5 days.  Promethazine-DM cough syrup- can make you drowsy.   Follow up with PCP if symptoms worsen or do not improve.   It was a pleasure meeting you!

## 2023-04-25 NOTE — Telephone Encounter (Signed)
 1st attempt - received VMB. LVM to return call

## 2023-04-25 NOTE — Telephone Encounter (Signed)
 Last filled on 10/03/22 #270 caps/ 1 refill  Pt did have an acute appt with Norma Fredrickson today but last OV was PCP was on 08/13/22

## 2023-04-25 NOTE — Telephone Encounter (Signed)
 Appt scheduled with Norma Fredrickson, NP today, will route to her and PCP as a [place]

## 2023-04-25 NOTE — Progress Notes (Signed)
 Established Patient Office Visit  Subjective   Patient ID: Lindsay Fry, female    DOB: 06-23-1960  Age: 63 y.o. MRN: [id number]  Chief Complaint  Patient presents with   Cough    Patient was sick last week with sinus stuff and saw Dr. Clent Ridges. Started with cough yesterday and low grade fever this morning. Patient takes tylenol daily for arthritis and felt better once she took that. Patient has had some wheezing and thinks after asthma may be affecting. Patient tested positive for covid  last week with Dr. Clent Ridges.     HPI  Lindsay Fry is a 63 year old female, patient of Dr. Milinda Antis, with past medical history of UC, bipolar disorder, diabetes mellitus, hypertension, hyperlipidemia, asthma, fibromyalgia presents today for an acute visit to discuss cough.  Her husband is also present today.  Patient called earlier today and was triaged by the RN for her to be seen in the office today.  Symptom onset was two weeks ago with cough when lying down. She was evaluated on 04/10/23 by Dr. Clent Ridges. She tested positive for covid 19. She had a chest x-ray which was negative for any acute findings, cbc and CMP were unremarkable that day.   Today she reports that her cough has progressively gotten worse over the last 24 hours. Cough is productive and nonproductive with wheezing and hoarseness in her voice. She has also had a low-grade fever this morning of 99.0.  She took some tylenol arthritis and felt better. She has a history of asthma and attributes her wheezing to that. She is not able to cough up the phlegm. Cough is worse laying down and at night. She has been taking the mucinex (without DM) but has not been able to cough up anything. Post nasal drip is still present. She also still has sinus pressure frontal and maxillary. She denies any chest pain, shortness of breath and difficulty breathing.   She is a former smoker but quit 30 years ago. She does have a history of asthma. She did not take the flu shot  this season and her last pneumonia vaccine was in 2018. No covid vaccine this season.    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   Elevated transaminase level 03/27/2020   Current use of proton pump inhibitor 03/19/2020   Hypokalemia 03/07/2020   Aortic atherosclerosis (HCC) 03/07/2020   MCI (mild cognitive impairment) 08/05/2019   Obesity (BMI 30-39.9) 04/23/2019   Poor balance 12/16/2018   Fatigue 12/16/2018   Migraine 11/12/2018   Cervical radiculopathy due to degenerative joint disease of spine 10/28/2017   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   Osteopenia 09/25/2016   Estrogen deficiency 05/30/2016   Carcinoma of overlapping sites of left breast in female, estrogen receptor positive (HCC) 08/21/2015   Chronic constipation 01/18/2015   Acute bronchitis 11/15/2014   NSTEMI (non-ST elevated myocardial infarction) (HCC) 09/06/2014   Atherosclerosis of native coronary artery of native heart with angina pectoris (HCC) 09/06/2014   DM (diabetes mellitus) (HCC) 09/05/2014   History of breast cancer 06/02/2014   ADD (attention deficit disorder) 11/19/2012   Low back pain 08/05/2012   Hypothyroid 05/18/2012   Chronic sinusitis 01/24/2012  Asymptomatic postmenopausal status 06/22/2008   Vitamin D deficiency 03/18/2008   BENIGN NEOPLASM OTH&UNSPEC SITE DIGESTIVE SYSTEM 01/09/2007   Diaphragmatic hernia 01/09/2007   History of colonic polyps 01/09/2007   Hyperlipidemia associated with type 2 diabetes mellitus (HCC) 01/07/2007   Essential hypertension 01/07/2007   Allergic rhinitis 01/07/2007   Asthma 01/07/2007   GERD 01/07/2007   ACNE  ROSACEA 01/07/2007   MIGRAINES, HX OF 01/07/2007   Fibromyalgia 10/30/2006   INSOMNIA 10/30/2006   Past Medical History:  Diagnosis Date   ADD (attention deficit disorder) 11/19/2012   Allergic rhinitis, cause unspecified    Anxiety state, unspecified    Arthritis    Asymptomatic postmenopausal status (age-related) (natural)    Atypical hyperplasia of left breast 2000   Atypical mole 03/16/2018   R paraspinal mid upper back, excision   Atypical mole 02/17/2018   R spinal upper back, moderate   Atypical mole 08/11/2017   L sternum, excision   Atypical mole 05/27/2017   L post neck, moderate   Benign neoplasm of other and unspecified site of the digestive system    Bipolar disorder (HCC)    Breast cancer (HCC)    Left Breast 6mm invasive CA left uiq, dcis left uoq and a lot ADH, ALH in both breasts.   CAD (coronary artery disease)    a. 08/2014 NSTEMI/Cath: LM nl, LAD 50p, D1/D2 min irregs, LCX nl, OM1 40, LPDA nl, RI 99ost (2.19mm vessel->med Rx), RCA nl, AM 60, nl EF; b. 11/2016 MV: EF 74%, no ischemia (performed 2/2 dyspnea & new inf TWI).   Carcinoma of left breast (HCC) 06/02/2014   Cataract    Chronic diastolic CHF (congestive heart failure) (HCC)    a. 08/2014 Echo: EF 60-65%, Gr 1 DD, nl LA.   Clotting disorder (HCC)    on blood thiner, Plavix   Cyst    on Achilles tendon   Depression    Diabetes mellitus (HCC)    frank   Dysuria    Edema    Family history of malignant neoplasm of gastrointestinal tract    Fibromyalgia    GERD (gastroesophageal reflux disease)    Glaucoma    pre glaucoma, 05/14/2018 pt. denies glaucoma   Headache    migraines   Hiatal hernia    History of alcoholism (HCC)    History of migraines    History of ovarian cyst    Hx of adenomatous colonic polyps    Insomnia, unspecified    Myocardial infarction (HCC) 09-04-14   OCD (obsessive compulsive disorder)    Osteopenia 09/25/2016   Femoral neck T -1.1  9/18   Other screening mammogram     Pure hypercholesterolemia    Rosacea    Subacute confusional state 11/19/2012   Tubular adenoma of colon 01/10/11   Ulcerative colitis, unspecified    Unspecified asthma(493.90)    Unspecified essential hypertension    Unspecified hypothyroidism    Unspecified vitamin D deficiency    Vertigo    Past Surgical History:  Procedure Laterality Date   APPENDECTOMY     AXILLARY SENTINEL NODE BIOPSY Left 05/23/2014   Procedure: AXILLARY SENTINEL NODE BIOPSY;  Surgeon: Kieth Brightly, MD;  Location: [facility];  Service: General;  Laterality: Left;   BREAST BIOPSY  08/1998   on tamoxifen, atypical hyperplasia   BREAST BIOPSY Left 04/2014   BREAST SURGERY Left 08/1998   lumpectomy/ Dr Okey Dupre   BREAST SURGERY Bilateral 05/23/2014   Mastectomy   BUNIONECTOMY Right  CARDIAC CATHETERIZATION N/A 09/05/2014   Procedure: Left Heart Cath and Coronary Angiography;  Surgeon: Iran Ouch, MD;  Location: [facility];  Service: Cardiovascular;  Laterality: N/A;   CARDIAC CATHETERIZATION  09/05/2014   CATARACT EXTRACTION W/ INTRAOCULAR LENS IMPLANT Bilateral    CESAREAN SECTION  1996/1997   placenta previa, gest DM, pre-eclampsia   CHOLECYSTECTOMY  1997   adhesions also   COLONOSCOPY  01/2001   Ulcerative colitis   COLONOSCOPY  04/2004   UC, polyp   COLONOSCOPY  12/2006   UC, no polyps   CORONARY STENT INTERVENTION N/A 04/06/2020   Procedure: CORONARY STENT INTERVENTION;  Surgeon: Yvonne Kendall, MD;  Location: [facility];  Service: Cardiovascular;  Laterality: N/A;   DEXA  04/1999 and 2010   normal   ESOPHAGOGASTRODUODENOSCOPY  09/2001   polyp   exercise stress test  11/2003   negative   FEMUR FRACTURE SURGERY Left    LEFT HEART CATH AND CORONARY ANGIOGRAPHY N/A 04/06/2020   Procedure: LEFT HEART CATH AND CORONARY ANGIOGRAPHY;  Surgeon: Antonieta Iba, MD;  Location: [facility];  Service: Cardiovascular;  Laterality: N/A;   MASTECTOMY Bilateral     MECKEL DIVERTICULUM EXCISION  1999   NASAL SINUS SURGERY  05/1997   nuclear stress test  06/2007   negative   precancerous mole removed     RIGHT AND LEFT HEART CATH     with 1 stent placed in March 2022   SIMPLE MASTECTOMY WITH AXILLARY SENTINEL NODE BIOPSY Bilateral 05/23/2014   Procedure: Bilateral simple mastectomy, left sentinel node biopsy ;  Surgeon: Kieth Brightly, MD;  Location: [facility];  Service: General;  Laterality: Bilateral;   Sleep study  11/2007   no apnea, but did snore (done by [facility])   TONSILLECTOMY  1984   TUBAL LIGATION     WISDOM TOOTH EXTRACTION  1980's   Allergies  Allergen Reactions   Ephedrine Other (See Comments)    Pt becomes hyper   Oxycodone Other (See Comments)    Panic Attack   Aspirin Other (See Comments)    REACTION: aggrivates colitis Tolerates 81 mg but unable to take doses >81 mg   Erythromycin Other (See Comments)    REACTION: GI upset   Nsaids     REACTION: aggrivate colitis- GI ok'd for short term use   Other Other (See Comments)    Unable to take due to history of ulcerative colitis   Rosuvastatin Other (See Comments)    REACTION: increased lfts         04/10/2023    2:09 PM 08/13/2022   11:35 AM 05/21/2022   11:15 AM  Depression screen PHQ 2/9  Decreased Interest 0 0 0  Down, Depressed, Hopeless 0 0 0  PHQ - 2 Score 0 0 0  Altered sleeping 0 0 0  Tired, decreased energy 0 1 1  Change in appetite 0 0 0  Feeling bad or failure about yourself  0 1 1  Trouble concentrating 0 0 0  Moving slowly or fidgety/restless 0 0 0  Suicidal thoughts 0 0 0  PHQ-9 Score 0 2 2  Difficult doing work/chores Not difficult at all Not difficult at all Somewhat difficult       04/10/2023    2:09 PM 08/13/2022   11:36 AM 05/21/2022   11:16 AM 04/29/2022    3:33 PM  GAD 7 : Generalized Anxiety Score  Nervous, Anxious, on Edge 0 0 0  0  Control/stop worrying 0 0 0 0  Worry too much - different things 0 0 0 0  Trouble relaxing 0 0 0 0   Restless 0 0 0 0  Easily annoyed or irritable 0 0 1 0  Afraid - awful might happen 0 0 0 0  Total GAD 7 Score 0 0 1 0  Anxiety Difficulty Not difficult at all Not difficult at all Not difficult at all Not difficult at all      Review of Systems  Constitutional:  Positive for malaise/fatigue. Negative for chills and fever.  HENT:  Positive for congestion. Negative for ear discharge, ear pain, hearing loss, nosebleeds, sinus pain, sore throat and tinnitus.        Sinus pressure.  Respiratory:  Positive for cough and wheezing. Negative for shortness of breath.   Cardiovascular:  Negative for chest pain.  Gastrointestinal:  Negative for abdominal pain, constipation, diarrhea, heartburn, nausea and vomiting.  Genitourinary:  Negative for dysuria, frequency and urgency.  Neurological:  Negative for dizziness and headaches.  Endo/Heme/Allergies:  Negative for polydipsia.  Psychiatric/Behavioral:  Negative for depression and suicidal ideas. The patient is not nervous/anxious.       Objective:     BP 132/86 (BP Location: Right Arm, Patient Position: Sitting, Cuff Size: Normal)   Pulse 92   Temp 98.2 F (36.8 C) (Oral)   Ht 5\' 7"  (1.702 m)   Wt 157 lb (71.2 kg)   LMP 08/23/2006 (Approximate) Comment: tubal ligation  SpO2 98%   BMI 24.59 kg/m  BP Readings from Last 3 Encounters:  04/25/23 132/86  04/10/23 102/78  04/07/23 100/60   Wt Readings from Last 3 Encounters:  04/25/23 157 lb (71.2 kg)  04/10/23 159 lb 2 oz (72.2 kg)  04/07/23 159 lb 12.8 oz (72.5 kg)      Physical Exam Vitals and nursing note reviewed.  Constitutional:      Appearance: She is ill-appearing.  HENT:     Right Ear: Hearing, tympanic membrane, ear canal and external ear normal.     Left Ear: Hearing, tympanic membrane, ear canal and external ear normal.     Nose: Congestion and rhinorrhea present.     Right Turbinates: Swollen.     Left Turbinates: Swollen.     Right Sinus: No maxillary sinus  tenderness or frontal sinus tenderness.     Left Sinus: No maxillary sinus tenderness or frontal sinus tenderness.     Mouth/Throat:     Mouth: Mucous membranes are moist.     Tongue: No lesions. Tongue does not deviate from midline.     Pharynx: Uvula midline. Postnasal drip present.  Cardiovascular:     Rate and Rhythm: Normal rate and regular rhythm.     Pulses: Normal pulses.     Heart sounds: Normal heart sounds.  Pulmonary:     Effort: Pulmonary effort is normal.     Breath sounds: Wheezing present.  Chest:     Chest wall: No tenderness.  Neurological:     Mental Status: She is alert and oriented to person, place, and time.  Psychiatric:        Mood and Affect: Mood normal.        Behavior: Behavior normal.        Thought Content: Thought content normal.        Judgment: Judgment normal.      No results found for any visits on 04/25/23.     The ASCVD Risk score (Arnett  DK, et al., 2019) failed to calculate for the following reasons:   Risk score cannot be calculated because patient has a medical history suggesting prior/existing ASCVD    Assessment & Plan:  Acute bronchitis, unspecified organism Assessment & Plan: No red flags on exam.  Wheezing on auscultation. Coughing present with deep breaths.  Reviewed notes, chest xray and lab results from 04/10/23.   Start Doxycycline antibiotic for the infection. Take 1 tablet by mouth twice daily for 7 days. Start prednisone 20 mg tablets. Take 2 tablets by mouth once daily in the morning for 5 days. Start Promethazine-DM cough syrup as needed for cough. Discuss side effects with patient.   ER precautions discussed with patient and husband. Verbalize understanding.   Follow up with PCP if symptoms worsen or do not improve.   Orders: -     predniSONE; Take 2 tablets (40 mg total) by mouth daily for 5 days.  Dispense: 10 tablet; Refill: 0 -     Promethazine-DM; Take 5 mLs by mouth 4 (four) times daily as needed.   Dispense: 118 mL; Refill: 0 -     Doxycycline Hyclate; Take 1 tablet (100 mg total) by mouth 2 (two) times daily for 7 days.  Dispense: 14 tablet; Refill: 0     Return if symptoms worsen or fail to improve.    Modesto Charon, NP

## 2023-04-25 NOTE — Assessment & Plan Note (Signed)
 No red flags on exam.  Wheezing on auscultation. Coughing present with deep breaths.  Reviewed notes, chest xray and lab results from 04/10/23.   Start Doxycycline antibiotic for the infection. Take 1 tablet by mouth twice daily for 7 days. Start prednisone 20 mg tablets. Take 2 tablets by mouth once daily in the morning for 5 days. Start Promethazine-DM cough syrup as needed for cough. Discuss side effects with patient.   ER precautions discussed with patient and husband. Verbalize understanding.   Follow up with PCP if symptoms worsen or do not improve.

## 2023-04-25 NOTE — Telephone Encounter (Signed)
  Chief Complaint: Cough Symptoms: nonproductive cough, slight wheezing, hoarse sounding voice Frequency: cough initially started last week-cough became progressively worse over the last 24 hours Pertinent Negatives: Patient denies CP, SOB Disposition: [] ED /[] Urgent Care (no appt availability in office) / [x] Appointment(In office/virtual)/ []  [facility]/ [] Home Care/ [] Refused Recommended Disposition /[] [facility]/ []  Follow-up with PCP Additional Notes: patient called with concerns for progressive non productive cough, slight wheezing with hoarse sounding voice. Patient states cough is worse laying down. Endorses that cough has gotten worse over the last 24 hours. Denies CP or SOB. Patient endorses low grade fevers, body aches and generally not feeling well. Patient is recommended to be seen today per protocol. Appointment made for today at 2:20 PM with another provider in PCP office as patient's PCP not available. Patient verbalized understanding of plan and all questions answered.      Summary: Coughing from lower lungs,   Copied From CRM [id number]. Reason for Triage:  patient calling in.   Coughing from lower lungs, does not produce anything. Coughing has gotten worse when laying down.  Patient would like to make an appt for today Patient phone # [phone] ok to leave detailed message     Reason for Disposition  Wheezing is present  Answer Assessment - Initial Assessment Questions 1. ONSET: "When did the cough begin?"      Started last week and got progressively worse yesterday 2. SEVERITY: "How bad is the cough today?"      Worse laying down-6 out of 10 3. SPUTUM: "Describe the color of your sputum" (none, dry cough; clear, white, yellow, green)     Non productive 4. HEMOPTYSIS: "Are you coughing up any blood?" If so ask: "How much?" (flecks, streaks, tablespoons, etc.)     no 5. DIFFICULTY BREATHING: "Are you having difficulty breathing?" If Yes, ask:  "How bad is it?" (e.g., mild, moderate, severe)    - MILD: No SOB at rest, mild SOB with walking, speaks normally in sentences, can lie down, no retractions, pulse < 100.    - MODERATE: SOB at rest, SOB with minimal exertion and prefers to sit, cannot lie down flat, speaks in phrases, mild retractions, audible wheezing, pulse 100-120.    - SEVERE: Very SOB at rest, speaks in single words, struggling to breathe, sitting hunched forward, retractions, pulse > 120      no 6. FEVER: "Do you have a fever?" If Yes, ask: "What is your temperature, how was it measured, and when did it start?"     Yes low grade fever of 99 7. CARDIAC HISTORY: "Do you have any history of heart disease?" (e.g., heart attack, congestive heart failure)      No 8. LUNG HISTORY: "Do you have any history of lung disease?"  (e.g., pulmonary embolus, asthma, emphysema)     No 9. PE RISK FACTORS: "Do you have a history of blood clots?" (or: recent major surgery, recent prolonged travel, bedridden)     No 10. OTHER SYMPTOMS: "Do you have any other symptoms?" (e.g., runny nose, wheezing, chest pain)       Slight wheezing 12. TRAVEL: "Have you traveled out of the country in the last month?" (e.g., travel history, exposures)       No  Protocols used: Cough - Acute Non-Productive-A-AH

## 2023-04-29 NOTE — Telephone Encounter (Signed)
 Per most recent chart notes Endo provider wants patient to follow up with Cardio for Repatha medication.

## 2023-05-03 ENCOUNTER — Encounter: Payer: Self-pay | Admitting: Cardiovascular Disease

## 2023-05-03 DIAGNOSIS — E782 Mixed hyperlipidemia: Secondary | ICD-10-CM

## 2023-05-05 MED ORDER — ALBUTEROL SULFATE HFA 108 (90 BASE) MCG/ACT IN AERS: INHALATION_SPRAY | RESPIRATORY_TRACT | 3 refills | Status: AC

## 2023-05-06 ENCOUNTER — Encounter: Payer: Self-pay | Admitting: Family Medicine

## 2023-05-08 NOTE — Telephone Encounter (Signed)
LVM for patient to c/b and schedule labs.

## 2023-05-08 NOTE — Telephone Encounter (Signed)
 Patient scheduled.

## 2023-05-09 ENCOUNTER — Other Ambulatory Visit

## 2023-05-09 ENCOUNTER — Telehealth (INDEPENDENT_AMBULATORY_CARE_PROVIDER_SITE_OTHER): Payer: Self-pay | Admitting: Family Medicine

## 2023-05-09 ENCOUNTER — Telehealth: Payer: Self-pay | Admitting: Pharmacy Technician

## 2023-05-09 DIAGNOSIS — I10 Essential (primary) hypertension: Secondary | ICD-10-CM

## 2023-05-09 DIAGNOSIS — E890 Postprocedural hypothyroidism: Secondary | ICD-10-CM

## 2023-05-09 DIAGNOSIS — E1169 Type 2 diabetes mellitus with other specified complication: Secondary | ICD-10-CM

## 2023-05-09 DIAGNOSIS — E559 Vitamin D deficiency, unspecified: Secondary | ICD-10-CM | POA: Diagnosis not present

## 2023-05-09 DIAGNOSIS — E785 Hyperlipidemia, unspecified: Secondary | ICD-10-CM

## 2023-05-09 DIAGNOSIS — Z79899 Other long term (current) drug therapy: Secondary | ICD-10-CM | POA: Diagnosis not present

## 2023-05-09 LAB — COMPREHENSIVE METABOLIC PANEL WITH GFR
ALT: 16 U/L (ref 0–35)
AST: 17 U/L (ref 0–37)
Albumin: 3.8 g/dL (ref 3.5–5.2)
Alkaline Phosphatase: 77 U/L (ref 39–117)
BUN: 9 mg/dL (ref 6–23)
CO2: 29 meq/L (ref 19–32)
Calcium: 8.9 mg/dL (ref 8.4–10.5)
Chloride: 107 meq/L (ref 96–112)
Creatinine, Ser: 0.71 mg/dL (ref 0.40–1.20)
GFR: 89.62 mL/min (ref >60.00)
Glucose, Bld: 103 mg/dL — ABNORMAL HIGH (ref 70–99)
Potassium: 3.9 meq/L (ref 3.5–5.1)
Sodium: 144 meq/L (ref 135–145)
Total Bilirubin: 0.4 mg/dL (ref 0.2–1.2)
Total Protein: 5.8 g/dL — ABNORMAL LOW (ref 6.0–8.3)

## 2023-05-09 LAB — CBC WITH DIFFERENTIAL/PLATELET
Basophils Absolute: 0 10*3/uL (ref 0.0–0.1)
Basophils Relative: 0.3 % (ref 0.0–3.0)
Eosinophils Absolute: 0.2 10*3/uL (ref 0.0–0.7)
Eosinophils Relative: 2.3 % (ref 0.0–5.0)
HCT: 45.7 % (ref 36.0–46.0)
Hemoglobin: 14.9 g/dL (ref 12.0–15.0)
Lymphocytes Relative: 18.6 % (ref 12.0–46.0)
Lymphs Abs: 1.8 10*3/uL (ref 0.7–4.0)
MCHC: 32.7 g/dL (ref 30.0–36.0)
MCV: 93.1 fl (ref 78.0–100.0)
Monocytes Absolute: 0.8 10*3/uL (ref 0.1–1.0)
Monocytes Relative: 7.9 % (ref 3.0–12.0)
Neutro Abs: 6.8 10*3/uL (ref 1.4–7.7)
Neutrophils Relative %: 70.9 % (ref 43.0–77.0)
Platelets: 362 10*3/uL (ref 150.0–400.0)
RBC: 4.91 Mil/uL (ref 3.87–5.11)
RDW: 14 % (ref 11.5–15.5)
WBC: 9.6 10*3/uL (ref 4.0–10.5)

## 2023-05-09 LAB — VITAMIN D 25 HYDROXY (VIT D DEFICIENCY, FRACTURES): VITD: 31.57 ng/mL (ref 30.00–100.00)

## 2023-05-09 LAB — LIPID PANEL
Cholesterol: 122 mg/dL (ref 0–200)
HDL: 55.5 mg/dL (ref >39.00)
LDL Cholesterol: 34 mg/dL (ref 0–99)
NonHDL: 66.38
Total CHOL/HDL Ratio: 2
Triglycerides: 162 mg/dL — ABNORMAL HIGH (ref 0.0–149.0)
VLDL: 32.4 mg/dL (ref 0.0–40.0)

## 2023-05-09 LAB — TSH: TSH: 0.11 u[IU]/mL — ABNORMAL LOW (ref 0.35–5.50)

## 2023-05-09 LAB — VITAMIN B12: Vitamin B-12: 1213 pg/mL — ABNORMAL HIGH (ref 211–911)

## 2023-05-09 MED ORDER — REPATHA SURECLICK 140 MG/ML ~~LOC~~ SOAJ: SUBCUTANEOUS | 3 refills | Status: AC

## 2023-05-09 NOTE — Telephone Encounter (Signed)
-----   Message from Bernadene Brewer sent at 05/08/2023  3:52 PM EDT ----- Regarding: LAB Tom, 05/09/23 Hello,  Patient is coming in for CPE labs on  Friday 05/09/23. Can we get orders please.   Thanks

## 2023-05-09 NOTE — Telephone Encounter (Signed)
 Pharmacy Patient Advocate Encounter  Received notification from [facility] that Prior Authorization for repatha  has been APPROVED from 05/09/23 to 05/08/24. Spoke to pharmacy to process.Copay is $22.19.    PA #/Case ID/Reference #: [id number]

## 2023-05-09 NOTE — Telephone Encounter (Signed)
 Pharmacy Patient Advocate Encounter   Received notification from CoverMyMeds that prior authorization for repatha  is required/requested.   Insurance verification completed.   The patient is insured through [facility] .   Per test claim: PA required; PA submitted to above mentioned insurance via CoverMyMeds Key/confirmation #/EOC [id number] Status is pending

## 2023-05-12 ENCOUNTER — Other Ambulatory Visit (HOSPITAL_COMMUNITY)
Admission: RE | Admit: 2023-05-12 | Discharge: 2023-05-12 | Disposition: A | Discharge status: Home / Self Care | Source: Ambulatory Visit | Attending: Family Medicine | Admitting: Family Medicine

## 2023-05-12 ENCOUNTER — Ambulatory Visit (INDEPENDENT_AMBULATORY_CARE_PROVIDER_SITE_OTHER): Admitting: Family Medicine

## 2023-05-12 ENCOUNTER — Encounter: Payer: Self-pay | Admitting: Family Medicine

## 2023-05-12 VITALS — BP 132/76 | HR 114 | Temp 97.7°F | Ht 64.5 in | Wt 153.0 lb

## 2023-05-12 DIAGNOSIS — I10 Essential (primary) hypertension: Secondary | ICD-10-CM | POA: Diagnosis not present

## 2023-05-12 DIAGNOSIS — Z01419 Encounter for gynecological examination (general) (routine) without abnormal findings: Secondary | ICD-10-CM | POA: Diagnosis present

## 2023-05-12 DIAGNOSIS — K5100 Ulcerative (chronic) pancolitis without complications: Secondary | ICD-10-CM

## 2023-05-12 DIAGNOSIS — Z1151 Encounter for screening for human papillomavirus (HPV): Secondary | ICD-10-CM | POA: Insufficient documentation

## 2023-05-12 DIAGNOSIS — E119 Type 2 diabetes mellitus without complications: Secondary | ICD-10-CM

## 2023-05-12 DIAGNOSIS — E1169 Type 2 diabetes mellitus with other specified complication: Secondary | ICD-10-CM

## 2023-05-12 DIAGNOSIS — Z79899 Other long term (current) drug therapy: Secondary | ICD-10-CM

## 2023-05-12 DIAGNOSIS — E2839 Other primary ovarian failure: Secondary | ICD-10-CM

## 2023-05-12 DIAGNOSIS — E785 Hyperlipidemia, unspecified: Secondary | ICD-10-CM

## 2023-05-12 DIAGNOSIS — G3281 Cerebellar ataxia in diseases classified elsewhere: Secondary | ICD-10-CM

## 2023-05-12 DIAGNOSIS — K219 Gastro-esophageal reflux disease without esophagitis: Secondary | ICD-10-CM

## 2023-05-12 DIAGNOSIS — E559 Vitamin D deficiency, unspecified: Secondary | ICD-10-CM

## 2023-05-12 DIAGNOSIS — E1159 Type 2 diabetes mellitus with other circulatory complications: Secondary | ICD-10-CM

## 2023-05-12 DIAGNOSIS — M85859 Other specified disorders of bone density and structure, unspecified thigh: Secondary | ICD-10-CM

## 2023-05-12 DIAGNOSIS — J209 Acute bronchitis, unspecified: Secondary | ICD-10-CM

## 2023-05-12 DIAGNOSIS — Z853 Personal history of malignant neoplasm of breast: Secondary | ICD-10-CM

## 2023-05-12 DIAGNOSIS — E890 Postprocedural hypothyroidism: Secondary | ICD-10-CM

## 2023-05-12 DIAGNOSIS — F3181 Bipolar II disorder: Secondary | ICD-10-CM

## 2023-05-12 NOTE — Assessment & Plan Note (Signed)
 Bilateral mastectomy  No longer getting mammograms

## 2023-05-12 NOTE — Assessment & Plan Note (Signed)
 Disc goals for lipids and reasons to control them Rev last labs with pt Rev low sat fat diet in detail  Repatha  (when she can afford) Atorvastatin  40 mg daily  LDL 34 In setting of CAD

## 2023-05-12 NOTE — Assessment & Plan Note (Signed)
 Lab Results  Component Value Date   VITAMINB12 1,213 (H) 05/09/2023   Instructed to cut B12 dose in 1/2 Last vitamin D  Lab Results  Component Value Date   VD25OH 31.57 05/09/2023

## 2023-05-12 NOTE — Assessment & Plan Note (Signed)
 Dexa ordered  Discussed fall prevention, supplements and exercise for bone density  No falls or fracture

## 2023-05-12 NOTE — Assessment & Plan Note (Signed)
Protonix daily 40 mg  Enc to watch diet and keep losing weight

## 2023-05-12 NOTE — Assessment & Plan Note (Signed)
 Last vitamin D  Lab Results  Component Value Date   VD25OH 31.57 05/09/2023   Encouraged continued oral supplementation for bone and overall health

## 2023-05-12 NOTE — Assessment & Plan Note (Addendum)
 Continues psychiatric care  Also counseling   Some caregiver stress   PHQ 0

## 2023-05-12 NOTE — Assessment & Plan Note (Signed)
 bp in fair control at this time  BP Readings from Last 1 Encounters:  05/12/23 132/76   No longer on amy medicine Weight loss has helped Most recent labs reviewed  Disc lifstyle change with low sodium diet and exercise

## 2023-05-12 NOTE — Patient Instructions (Addendum)
 When you are feeling up to it-you are due for prevnar 20 vaccine   If you are interested in the new shingles vaccine (Shingrix) - call your local pharmacy to check on coverage and availability   Keep walking Add some strength training to your routine, this is important for bone and brain health and can reduce your risk of falls and help your body use insulin  properly and regulate weight  Light weights, exercise bands , and internet videos are a good way to start  Yoga (chair or regular), machines , floor exercises or a gym with machines are also good options   Get back to the gym if you can   Lab Results  Component Value Date   TSH 0.11 (L) 05/09/2023   This is low, let your endocrinologist know   Pap test today  We will reach out with results    You have an order for:  []   2D Mammogram  []   3D Mammogram  [x]   Bone Density     Please call for appointment:   [x]   [facility]  [address] [place] [address]  [phone]  []   [facility])   [address]  [place], [place] [address]  [phone]  []   [facility]      [address] [place], [place]        [phone]         []   [facility]  [address] [place], [place]  [phone]  []  [facility]   [address]   [place], [place] [address]  [phone]  []  [facility]  [address] [place], [place] [address] [phone]    Make sure to wear two piece clothing  No lotions powders or deodorants the day of the appointment Make sure to bring picture ID and insurance card.  Bring list of medications you are currently taking including any supplements.   Schedule your screening mammogram through MyChart!   Select [facility] imaging sites can now be  scheduled through MyChart.  Log into your MyChart account.  Go to 'Visit' (or 'Appointments' if  on mobile App) --> Schedule an  Appointment  Under 'Select a Reason for Visit' choose the Mammogram  Screening option.  Complete the pre-visit questions  and select the time and place that  best fits your schedule

## 2023-05-12 NOTE — Assessment & Plan Note (Signed)
 Much clinically improved

## 2023-05-12 NOTE — Assessment & Plan Note (Signed)
 Routine gyn exam with pap  No complaintes Post menopausal

## 2023-05-12 NOTE — Assessment & Plan Note (Signed)
 Clinically stable.

## 2023-05-12 NOTE — Assessment & Plan Note (Signed)
 Utd colonoscopy and GI

## 2023-05-12 NOTE — Progress Notes (Signed)
 Subjective:    Patient ID: Lindsay Fry, female    DOB: September 08, 1960, 63 y.o.   MRN: [id number]  HPI  Here for annual follow up of chronic medical problems   Wt Readings from Last 3 Encounters:  05/12/23 153 lb (69.4 kg)  04/25/23 157 lb (71.2 kg)  04/10/23 159 lb 2 oz (72.2 kg)   25.86 kg/m  Vitals:   05/12/23 1402  BP: 132/76  Pulse: (!) 114  Temp: 97.7 F (36.5 C)  SpO2: 99%    Immunization History  Administered Date(s) Administered   Influenza Split 12/05/2010, 10/23/2011   Influenza Whole 01/15/2004, 10/15/2006, 10/20/2007, 10/13/2009   Influenza,inj,Quad PF,6+ Mos 11/18/2012, 10/19/2013, 11/10/2015, 10/15/2016, 11/18/2017   PFIZER(Purple Top)SARS-COV-2 Vaccination 06/18/2019, 07/09/2019   Pneumococcal Polysaccharide-23 06/18/2007, 10/15/2016   Td 05/15/2001   Tdap 12/05/2010    Health Maintenance Due  Topic Date Due   Medicare Annual Wellness (AWV)  Never done   Pneumococcal Vaccine 27-74 Years old (2 of 2 - PCV) 10/15/2017   OPHTHALMOLOGY EXAM  01/23/2021   Cervical Cancer Screening (HPV/Pap Cotest)  01/30/2022   Last pna vaccine 2018-prevnar 23   Tdap 2012  Shingrix  Interested when she can get it /if affordable    Mammogram-had mastectomy    Gyn health Gyn retired / then got a new one  Pap 01/2017 No gyn problems    Colon cancer screening   colonoscopy 04/2021 with 3 y recall  IBD     Bone health  Dexa 02/2020 Osteopenia  Falls-none  Fractures-none  Supplements  Last vitamin D  Lab Results  Component Value Date   VD25OH 31.57 05/09/2023    Exercise :  Walking  They took her pool away   Utd derm care     Mood    04/25/2023    3:12 PM 04/10/2023    2:09 PM 08/13/2022   11:35 AM 05/21/2022   11:15 AM 04/29/2022    3:32 PM  Depression screen PHQ 2/9  Decreased Interest 0 0 0 0 0  Down, Depressed, Hopeless 0 0 0 0 0  PHQ - 2 Score 0 0 0 0 0  Altered sleeping 0 0 0 0 0  Tired, decreased energy 0 0 1 1 1   Change in  appetite 0 0 0 0 0  Feeling bad or failure about yourself  0 0 1 1 0  Trouble concentrating 0 0 0 0 0  Moving slowly or fidgety/restless 0 0 0 0 0  Suicidal thoughts 0 0 0 0 0  PHQ-9 Score 0 0 2 2 1   Difficult doing work/chores Not difficult at all Not difficult at all Not difficult at all Somewhat difficult Not difficult at all  Bipolar 2  Under psych care Sees therapist every 2 weeks, Quirino Buckles    Really stressed out  Caring for husband with parkinsons who also was sick /sinusitis    Recent bronchitis Treatment with doxy and perdnisone   HTN bp is stable today  No cp or palpitations or headaches or edema  No side effects to medicines  BP Readings from Last 3 Encounters:  05/12/23 132/76  04/25/23 132/86  04/10/23 102/78    Olmesartan  5 mg daily -last time -that was stopped  Sees cardiology  Also CAD   Lab Results  Component Value Date   NA 144 05/09/2023   K 3.9 05/09/2023   CO2 29 05/09/2023   GLUCOSE 103 (H) 05/09/2023   BUN 9 05/09/2023   CREATININE 0.71 05/09/2023  CALCIUM  8.9 05/09/2023   GFR 89.62 05/09/2023   GFRNONAA >60 05/24/2022   Lab Results  Component Value Date   ALT 16 05/09/2023   AST 17 05/09/2023   ALKPHOS 77 05/09/2023   BILITOT 0.4 05/09/2023   Lab Results  Component Value Date   WBC 9.6 05/09/2023   HGB 14.9 05/09/2023   HCT 45.7 05/09/2023   MCV 93.1 05/09/2023   PLT 362.0 05/09/2023      Endo care for DM2 Well controlled!   Hyperlipidemia Lab Results  Component Value Date   CHOL 122 05/09/2023   CHOL 80 08/21/2022   CHOL 90 04/22/2022   Lab Results  Component Value Date   HDL 55.50 05/09/2023   HDL 50.00 08/21/2022   HDL 54.70 04/22/2022   Lab Results  Component Value Date   LDLCALC 34 05/09/2023   LDLCALC 14 08/21/2022   LDLCALC 19 04/22/2022   Lab Results  Component Value Date   TRIG 162.0 (H) 05/09/2023   TRIG 81.0 08/21/2022   TRIG 83.0 04/22/2022   Lab Results  Component Value Date    CHOLHDL 2 05/09/2023   CHOLHDL 2 08/21/2022   CHOLHDL 2 04/22/2022   Lab Results  Component Value Date   LDLDIRECT 84.0 12/04/2021   LDLDIRECT 72.0 05/29/2021   LDLDIRECT 125.0 02/07/2020   Atorvastatin  40 mg daily  Repatha  (on and off with ins coverage)    Hypothyroidism  Pt has no clinical changes No change in energy level/ hair or skin/ edema and no tremor Lab Results  Component Value Date   TSH 0.11 (L) 05/09/2023    Under endocrinology care  Has appointment upcoming/was working with dose    Lab Results  Component Value Date   VITAMINB12 1,213 (H) 05/09/2023     Patient Active Problem List   Diagnosis Date Noted   Encounter for gynecological examination 05/12/2023   Generalized body aches 04/18/2023   Shoulder pain, right 08/07/2022   Ulcerative (chronic) pancolitis without complications (HCC) 04/29/2022   Stress reaction 10/29/2021   Bipolar 2 disorder (HCC) 04/17/2021   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   Elevated transaminase level 03/27/2020   Current use of proton pump inhibitor 03/19/2020   Hypokalemia 03/07/2020   Aortic atherosclerosis (HCC) 03/07/2020   MCI (mild cognitive impairment) 08/05/2019   Poor balance 12/16/2018   Fatigue 12/16/2018   Migraine 11/12/2018   Cervical radiculopathy due to degenerative joint disease of spine 10/28/2017   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   Osteopenia 09/25/2016   Estrogen deficiency 05/30/2016   Chronic constipation 01/18/2015   Acute bronchitis 11/15/2014   NSTEMI (non-ST elevated myocardial infarction) (HCC) 09/06/2014   Atherosclerosis of native coronary artery of native heart with angina pectoris (HCC) 09/06/2014   DM (diabetes mellitus) (HCC) 09/05/2014   History of breast cancer 06/02/2014   ADD (attention deficit disorder) 11/19/2012   Low back pain 08/05/2012   Hypothyroid 05/18/2012   Chronic sinusitis 01/24/2012   Asymptomatic postmenopausal status 06/22/2008    Vitamin D  deficiency 03/18/2008   BENIGN NEOPLASM OTH&UNSPEC SITE DIGESTIVE SYSTEM 01/09/2007   Diaphragmatic hernia 01/09/2007   History of colonic polyps 01/09/2007   Hyperlipidemia associated with type 2 diabetes mellitus (HCC) 01/07/2007   Essential hypertension 01/07/2007   Allergic rhinitis 01/07/2007   Asthma 01/07/2007   GERD 01/07/2007   ACNE ROSACEA 01/07/2007   MIGRAINES, HX OF 01/07/2007   Fibromyalgia 10/30/2006   INSOMNIA 10/30/2006   Past Medical History:  Diagnosis Date   ADD (attention  deficit disorder) 11/19/2012   Allergic rhinitis, cause unspecified    Anxiety state, unspecified    Arthritis    Asymptomatic postmenopausal status (age-related) (natural)    Atypical hyperplasia of left breast 2000   Atypical mole 03/16/2018   R paraspinal mid upper back, excision   Atypical mole 02/17/2018   R spinal upper back, moderate   Atypical mole 08/11/2017   L sternum, excision   Atypical mole 05/27/2017   L post neck, moderate   Benign neoplasm of other and unspecified site of the digestive system    Bipolar disorder (HCC)    Breast cancer (HCC)    Left Breast 6mm invasive CA left uiq, dcis left uoq and a lot ADH, ALH in both breasts.   CAD (coronary artery disease)    a. 08/2014 NSTEMI/Cath: LM nl, LAD 50p, D1/D2 min irregs, LCX nl, OM1 40, LPDA nl, RI 99ost (2.7mm vessel->med Rx), RCA nl, AM 60, nl EF; b. 11/2016 MV: EF 74%, no ischemia (performed 2/2 dyspnea & new inf TWI).   Carcinoma of left breast (HCC) 06/02/2014   Cataract    Chronic diastolic CHF (congestive heart failure) (HCC)    a. 08/2014 Echo: EF 60-65%, Gr 1 DD, nl LA.   Clotting disorder (HCC)    on blood thiner, Plavix    Cyst    on Achilles tendon   Depression    Diabetes mellitus (HCC)    frank   Dysuria    Edema    Family history of malignant neoplasm of gastrointestinal tract    Fibromyalgia    GERD (gastroesophageal reflux disease)    Glaucoma    pre glaucoma, 05/14/2018 pt. denies  glaucoma   Headache    migraines   Hiatal hernia    History of alcoholism (HCC)    History of migraines    History of ovarian cyst    Hx of adenomatous colonic polyps    Insomnia, unspecified    Myocardial infarction (HCC) 09-04-14   OCD (obsessive compulsive disorder)    Osteopenia 09/25/2016   Femoral neck T -1.1  9/18   Other screening mammogram    Pure hypercholesterolemia    Rosacea    Subacute confusional state 11/19/2012   Tubular adenoma of colon 01/10/11   Ulcerative colitis, unspecified    Unspecified asthma(493.90)    Unspecified essential hypertension    Unspecified hypothyroidism    Unspecified vitamin D  deficiency    Vertigo    Past Surgical History:  Procedure Laterality Date   APPENDECTOMY     AXILLARY SENTINEL NODE BIOPSY Left 05/23/2014   Procedure: AXILLARY SENTINEL NODE BIOPSY;  Surgeon: Jerlean Mood, MD;  Location: [facility];  Service: General;  Laterality: Left;   BREAST BIOPSY  08/1998   on tamoxifen, atypical hyperplasia   BREAST BIOPSY Left 04/2014   BREAST SURGERY Left 08/1998   lumpectomy/ Dr Nicolette Barrio   BREAST SURGERY Bilateral 05/23/2014   Mastectomy   BUNIONECTOMY Right    CARDIAC CATHETERIZATION N/A 09/05/2014   Procedure: Left Heart Cath and Coronary Angiography;  Surgeon: Wenona Hamilton, MD;  Location: [facility];  Service: Cardiovascular;  Laterality: N/A;   CARDIAC CATHETERIZATION  09/05/2014   CATARACT EXTRACTION W/ INTRAOCULAR LENS IMPLANT Bilateral    CESAREAN SECTION  1996/1997   placenta previa, gest DM, pre-eclampsia   CHOLECYSTECTOMY  1997   adhesions also   COLONOSCOPY  01/2001   Ulcerative colitis   COLONOSCOPY  04/2004   UC, polyp   COLONOSCOPY  12/2006   UC, no polyps   CORONARY STENT INTERVENTION N/A 04/06/2020   Procedure: CORONARY STENT INTERVENTION;  Surgeon: Sammy Crisp, MD;  Location: [facility];  Service: Cardiovascular;  Laterality: N/A;   DEXA  04/1999 and 2010   normal    ESOPHAGOGASTRODUODENOSCOPY  09/2001   polyp   exercise stress test  11/2003   negative   FEMUR FRACTURE SURGERY Left    LEFT HEART CATH AND CORONARY ANGIOGRAPHY N/A 04/06/2020   Procedure: LEFT HEART CATH AND CORONARY ANGIOGRAPHY;  Surgeon: Devorah Fonder, MD;  Location: [facility];  Service: Cardiovascular;  Laterality: N/A;   MASTECTOMY Bilateral    MECKEL DIVERTICULUM EXCISION  1999   NASAL SINUS SURGERY  05/1997   nuclear stress test  06/2007   negative   precancerous mole removed     RIGHT AND LEFT HEART CATH     with 1 stent placed in March 2022   SIMPLE MASTECTOMY WITH AXILLARY SENTINEL NODE BIOPSY Bilateral 05/23/2014   Procedure: Bilateral simple mastectomy, left sentinel node biopsy ;  Surgeon: Jerlean Mood, MD;  Location: [facility];  Service: General;  Laterality: Bilateral;   Sleep study  11/2007   no apnea, but did snore (done by [facility])   TONSILLECTOMY  1984   TUBAL LIGATION     WISDOM TOOTH EXTRACTION  1980's   Social History   Tobacco Use   Smoking status: Former    Current packs/day: 0.00    Average packs/day: 0.3 packs/day for 5.0 years (1.3 ttl pk-yrs)    Types: Cigarettes    Start date: 01/15/1988    Quit date: 01/14/1993    Years since quitting: 30.3   Smokeless tobacco: Never  Vaping Use   Vaping status: Never Used  Substance Use Topics   Alcohol use: No    Alcohol/week: 0.0 standard drinks of alcohol    Comment: Recovered ETOH   Drug use: No   Family History  Problem Relation Age of Onset   Coronary artery disease Father    Colon cancer Father    Alzheimer's disease Father    Dementia Father    Colon polyps Father    Coronary artery disease Mother    Hypertension Mother    Osteoporosis Mother    Crohn's disease Mother        crohns colitis   Breast cancer Other        great aunts   Coronary artery disease Other        Uncle (also AAA)   Diabetes Other        remote family history   Stroke Cousin    Esophageal  cancer Neg Hx    Rectal cancer Neg Hx    Stomach cancer Neg Hx    Allergies  Allergen Reactions   Ephedrine Other (See Comments)    Pt becomes hyper   Oxycodone  Other (See Comments)    Panic Attack   Aspirin  Other (See Comments)    REACTION: aggrivates colitis Tolerates 81 mg but unable to take doses >81 mg   Erythromycin Other (See Comments)    REACTION: GI upset   Nsaids     REACTION: aggrivate colitis- GI ok'd for short term use   Other Other (See Comments)    Unable to take due to history of ulcerative colitis   Rosuvastatin Other (See Comments)    REACTION: increased lfts   Current Outpatient Medications on File Prior to Visit  Medication Sig Dispense  Refill   acetaminophen  (TYLENOL ) 650 MG CR tablet Take 1,300 mg by mouth 2 (two) times daily.     albuterol  (VENTOLIN  HFA) 108 (90 Base) MCG/ACT inhaler INHALE 1 PUFF INTO THE LUNGS EVERY 4 HOURS AS NEEDED FOR WHEEZING 1 each 3   amantadine  (SYMMETREL ) 100 MG capsule Take 100 mg by mouth 2 (two) times daily.     Ascorbic Acid  (VITAMIN C  PO) Take 1,000 mg by mouth daily.     aspirin  81 MG chewable tablet Chew 1 tablet (81 mg total) by mouth daily. 90 tablet 0   atorvastatin  (LIPITOR ) 40 MG tablet TAKE 1 TABLET BY MOUTH ONCE DAILY 90 tablet 3   B Complex Vitamins (VITAMIN B COMPLEX PO) Take 1 tablet by mouth every other day.     BAYER MICROLET LANCETS lancets USE AS DIRECTED TO CHECK BLOOD SUGAR TWICE DAILY 200 each 2   Blood Glucose Monitoring Suppl (CONTOUR NEXT MONITOR) w/Device KIT 1 each by Does not apply route in the morning and at bedtime. Use Contour Next meter to check blood sugar twice daily. 1 kit 0   Calcium  Carb-Cholecalciferol  (CALTRATE 600+D3 PO) Take 1 tablet by mouth daily. 300 mg Vitamin D3     cyclobenzaprine  (FLEXERIL ) 5 MG tablet TAKE 1 TABLET BY MOUTH EVERY 8 HOURS AS NEEDED FOR MUSCLE SPASMS (BACK PAIN) 30 tablet 1   dextroamphetamine  (DEXEDRINE  SPANSULE) 15 MG 24 hr capsule Take 5 mg by mouth daily as  needed.     diazepam  (VALIUM ) 10 MG tablet Take 2.5-7.5 mg by mouth daily as needed for anxiety.     docusate sodium  (COLACE) 100 MG capsule Take 100-300 mg by mouth daily as needed for mild constipation or moderate constipation.     Evolocumab  (REPATHA  SURECLICK) 140 MG/ML SOAJ INJECT 140MG  SUBCUTANEOUSLY EVERY 2 WEEKS 6 mL 3   famotidine  (PEPCID ) 40 MG tablet Take 40 mg by mouth 2 (two) times daily. 30 min. Before meal and at bedtime     fluticasone  (FLONASE ) 50 MCG/ACT nasal spray Place 1 spray into both nostrils daily. 16 g 6   gabapentin  (NEURONTIN ) 300 MG capsule TAKE 1 CAPSULE BY MOUTH 3 TIMES DAILY 270 capsule 1   glucose blood (CONTOUR NEXT TEST) test strip Use to check blood sugar twice a day. 200 each 5   JARDIANCE  25 MG TABS tablet TAKE 1 TABLET BY MOUTH ONCE DAILY 90 tablet 3   lamoTRIgine  (LAMICTAL ) 200 MG tablet Take 400 mg by mouth daily.     latanoprost  (XALATAN ) 0.005 % ophthalmic solution Place 1 drop into both eyes 2 (two) times a week.      levothyroxine  (LEVOXYL ) 125 MCG tablet Take 2 tablets by mouth daily 180 tablet 2   loperamide  (IMODIUM  A-D) 2 MG tablet Take 2 mg by mouth daily as needed for diarrhea or loose stools.     Melatonin 10 MG CAPS Take 10 mg by mouth at bedtime.     mesalamine  (LIALDA ) 1.2 g EC tablet Take 2 tablets (2.4 g total) by mouth daily. Office visit for further refills 180 tablet 0   metoprolol  tartrate (LOPRESSOR ) 25 MG tablet Take 1 tablet (25 mg total) by mouth 2 (two) times daily as needed (for tachycardia). 60 tablet 3   Multiple Vitamin (MULTIVITAMIN) tablet Take 1 tablet by mouth daily. Woman 50 plus     nitroGLYCERIN  (NITROSTAT ) 0.4 MG SL tablet Place 1 tablet (0.4 mg total) under the tongue every 5 (five) minutes as needed for chest pain. 25 tablet  3   ondansetron  (ZOFRAN -ODT) 4 MG disintegrating tablet DISSOLVE 1 TABLET ON THE TONGUE EVERY 6 HOURS AS NEEDED FOR NAUSEA OR VOMIT 90 tablet 1   pantoprazole  (PROTONIX ) 40 MG tablet TAKE 1 TABLET  BY MOUTH TWICE DAILY 180 tablet 1   polyethylene glycol (MIRALAX  / GLYCOLAX ) 17 g packet Take 17 g by mouth 3 (three) times daily as needed for moderate constipation, mild constipation or severe constipation. In water  or beverage     Probiotic Product (CULTURELLE PROBIOTICS PO) Take 1 capsule by mouth daily. For woman     QUEtiapine  (SEROQUEL  XR) 400 MG 24 hr tablet Take 800 mg by mouth at bedtime.     simethicone  (MYLICON) 80 MG chewable tablet Chew 80 mg by mouth 4 (four) times daily.     tirzepatide  (MOUNJARO ) 15 MG/0.5ML Pen Inject 15 mg into the skin once a week. 6 mL 4   valACYclovir  (VALTREX ) 1000 MG tablet TAKE 1 TABLET BY MOUTH ONCE DAILY, take one tablet po bid x 5 days prn outbreak 90 tablet 3   VRAYLAR  capsule Take 3 mg by mouth at bedtime.     zaleplon (SONATA) 10 MG capsule Take 10-20 mg by mouth at bedtime.     No current facility-administered medications on file prior to visit.    Review of Systems  Constitutional:  Positive for fatigue. Negative for activity change, appetite change, fever and unexpected weight change.  HENT:  Negative for congestion, ear pain, rhinorrhea, sinus pressure and sore throat.   Eyes:  Negative for pain, redness and visual disturbance.  Respiratory:  Negative for cough, shortness of breath and wheezing.   Cardiovascular:  Negative for chest pain and palpitations.  Gastrointestinal:  Negative for abdominal pain, blood in stool, constipation and diarrhea.  Endocrine: Negative for polydipsia and polyuria.  Genitourinary:  Negative for dysuria, frequency and urgency.  Musculoskeletal:  Negative for arthralgias, back pain and myalgias.  Skin:  Negative for pallor and rash.  Allergic/Immunologic: Negative for environmental allergies.  Neurological:  Negative for dizziness, syncope and headaches.  Hematological:  Negative for adenopathy. Does not bruise/bleed easily.  Psychiatric/Behavioral:  Negative for decreased concentration. The patient is not  nervous/anxious.        Objective:   Physical Exam Constitutional:      General: She is not in acute distress.    Appearance: Normal appearance. She is well-developed. She is not ill-appearing or diaphoretic.  HENT:     Head: Normocephalic and atraumatic.     Right Ear: Tympanic membrane, ear canal and external ear normal.     Left Ear: Tympanic membrane, ear canal and external ear normal.     Nose: Nose normal. No congestion.     Mouth/Throat:     Mouth: Mucous membranes are moist.     Pharynx: Oropharynx is clear. No posterior oropharyngeal erythema.  Eyes:     General: No scleral icterus.    Extraocular Movements: Extraocular movements intact.     Conjunctiva/sclera: Conjunctivae normal.     Pupils: Pupils are equal, round, and reactive to light.  Neck:     Thyroid : No thyromegaly.     Vascular: No carotid bruit or JVD.  Cardiovascular:     Rate and Rhythm: Normal rate and regular rhythm.     Pulses: Normal pulses.     Heart sounds: Normal heart sounds.     No gallop.  Pulmonary:     Effort: Pulmonary effort is normal. No respiratory distress.     Breath  sounds: Normal breath sounds. No wheezing.     Comments: Good air exch Chest:     Chest wall: No tenderness.  Abdominal:     General: Bowel sounds are normal. There is no distension or abdominal bruit.     Palpations: Abdomen is soft. There is no mass.     Tenderness: There is no abdominal tenderness.     Hernia: No hernia is present.  Genitourinary:    Comments: Bilateral mastectomy sites without lumps or skin change               Anus appears normal w/o hemorrhoids or masses       External genitalia : nl appearance and hair distribution/no lesions       Urethral meatus : nl size, no lesions or prolapse       Urethra: no masses, tenderness or scarring      Bladder : no masses or tenderness       Vagina: nl general appearance, no discharge or  Lesions, no significant cystocele  or rectocele       Cervix: no  lesions/ discharge or friability      Uterus: nl size, contour, position, and mobility (not fixed) , non tender      Adnexa : no masses, tenderness, enlargement or nodularity          Musculoskeletal:        General: No tenderness. Normal range of motion.     Cervical back: Normal range of motion and neck supple. No rigidity. No muscular tenderness.     Right lower leg: No edema.     Left lower leg: No edema.     Comments: No kyphosis   Lymphadenopathy:     Cervical: No cervical adenopathy.  Skin:    General: Skin is warm and dry.     Coloration: Skin is not pale.     Findings: No erythema or rash.     Comments: Solar lentigines diffusely Some sks and seb hyperplasia   Neurological:     Mental Status: She is alert. Mental status is at baseline.     Cranial Nerves: No cranial nerve deficit.     Motor: No abnormal muscle tone.     Coordination: Coordination normal.     Gait: Gait normal.     Deep Tendon Reflexes: Reflexes are normal and symmetric. Reflexes normal.  Psychiatric:        Mood and Affect: Mood normal.        Cognition and Memory: Cognition and memory normal.           Assessment & Plan:   Problem List Items Addressed This Visit       Cardiovascular and Mediastinum   Essential hypertension - Primary   bp in fair control at this time  BP Readings from Last 1 Encounters:  05/12/23 132/76   No longer on amy medicine Weight loss has helped Most recent labs reviewed  Disc lifstyle change with low sodium diet and exercise          Respiratory   Acute bronchitis   Much clinically improved         Digestive   Ulcerative (chronic) pancolitis without complications (HCC)   Utd colonoscopy and GI       GERD   Protonix  daily 40 mg  Enc to watch diet and keep losing weight         Endocrine   DM (diabetes mellitus) (HCC) (Chronic)   Good control  Losing weight with glp-1 Encouraged strongly to add strength/muscle building exercise       Hypothyroid   Hypothyroidism  Pt has no clinical changes No change in energy level/ hair or skin/ edema and no tremor Lab Results  Component Value Date   TSH 0.11 (L) 05/09/2023  Managed by her endocrinologist-pt will follow up with them      Hyperlipidemia associated with type 2 diabetes mellitus (HCC)   Disc goals for lipids and reasons to control them Rev last labs with pt Rev low sat fat diet in detail  Repatha  (when she can afford) Atorvastatin  40 mg daily  LDL 34 In setting of CAD        Nervous and Auditory   Cerebellar ataxia in diseases classified elsewhere (HCC)   Clinically stable         Musculoskeletal and Integument   Osteopenia   Dexa ordered Discussed fall prevention, supplements and exercise for bone density  No falls or fracture         Other   Vitamin D  deficiency   Last vitamin D  Lab Results  Component Value Date   VD25OH 31.57 05/09/2023   Encouraged continued oral supplementation for bone and overall health      History of breast cancer   Bilateral mastectomy  No longer getting mammograms       Estrogen deficiency   Dexa ordered       Relevant Orders   DG Bone Density   Encounter for gynecological examination   Routine gyn exam with pap  No complaintes Post menopausal       Relevant Orders   Cytology - PAP(Media)   Current use of proton pump inhibitor   Lab Results  Component Value Date   VITAMINB12 1,213 (H) 05/09/2023   Instructed to cut B12 dose in 1/2 Last vitamin D  Lab Results  Component Value Date   VD25OH 31.57 05/09/2023         Bipolar 2 disorder (HCC)   Continues psychiatric care  Also counseling   Some caregiver stress   PHQ 0        Other Visit Diagnoses       Controlled type 2 diabetes mellitus with insulin  therapy (HCC)

## 2023-05-12 NOTE — Assessment & Plan Note (Signed)
 Hypothyroidism  Pt has no clinical changes No change in energy level/ hair or skin/ edema and no tremor Lab Results  Component Value Date   TSH 0.11 (L) 05/09/2023  Managed by her endocrinologist-pt will follow up with them

## 2023-05-12 NOTE — Assessment & Plan Note (Signed)
 Dexa ordered

## 2023-05-12 NOTE — Assessment & Plan Note (Signed)
 Good control  Losing weight with glp-1 Encouraged strongly to add strength/muscle building exercise

## 2023-05-13 ENCOUNTER — Encounter: Payer: Self-pay | Admitting: General Practice

## 2023-05-13 ENCOUNTER — Ambulatory Visit (INDEPENDENT_AMBULATORY_CARE_PROVIDER_SITE_OTHER): Admitting: General Practice

## 2023-05-13 VITALS — BP 124/84 | HR 106 | Temp 98.0°F | Ht 64.6 in | Wt 154.0 lb

## 2023-05-13 DIAGNOSIS — J329 Chronic sinusitis, unspecified: Secondary | ICD-10-CM

## 2023-05-13 DIAGNOSIS — H109 Unspecified conjunctivitis: Secondary | ICD-10-CM | POA: Diagnosis not present

## 2023-05-13 MED ORDER — AMOXICILLIN-POT CLAVULANATE 875-125 MG PO TABS: 1.0000 | ORAL_TABLET | Freq: Two times a day (BID) | ORAL | 0 refills | Status: AC

## 2023-05-13 MED ORDER — POLYMYXIN B-TRIMETHOPRIM 10000-0.1 UNIT/ML-% OP SOLN: OPHTHALMIC | 0 refills | Status: DC

## 2023-05-13 NOTE — Progress Notes (Signed)
 Established Patient Office Visit  Subjective   Patient ID: Lindsay Fry, female    DOB: 1960/12/28  Age: 63 y.o. MRN: [id number]  Chief Complaint  Patient presents with   Conjunctivitis    S/S conjunctivits:  Right eye mucous type of discharge after taking afternoon nap. Sore especially upper and lower lids and eyeball, soreness started also after nap. Since after appointment with Dr Malissa Se for wellness exam yesterday, she also coughing and feeling fatigued again since this afternoon. She was in an urgent care facility Saturday with her husband for his symptoms which were diagnosed as sinus infection.  Until today, 04/25/23 symptoms had remarkably decreased.      Conjunctivitis  Associated symptoms include congestion, cough, wheezing, eye discharge, eye pain and eye redness. Pertinent negatives include no fever, no double vision, no photophobia, no abdominal pain, no constipation, no diarrhea, no nausea, no vomiting, no ear discharge, no ear pain, no headaches, no hearing loss and no sore throat.    Lindsay Fry is a 63 year old female, patient of Dr. Malissa Se, with past medical history of HTN, NSTEMI, allergic rhinitis, asthma, chronic sinusitis, diaphragmatic hernia, DM, HLD, hypothyroidism, ADD, migraines, presents today for an acute visit.   Conjunctivitis: symptom onset. Only right eye. After nap yesterday afternoon, she noticed that her eye lid was sore and puffy. Then this morning when she woke up, her right eye was glued shut and she used warm compresses to loosen and get it open. No photophobia or itching. She does have some pain in the sclera. She was at the urgent care with her husband Saturday and is not sure if she caught something there. She is having some nasal congestion and cough which started sunday. She has dark green colored mucus. She is having some sore throat. Denies any fever, chest pain or shortness of breath.   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   Elevated transaminase level 03/27/2020   Current use of proton pump inhibitor 03/19/2020   Hypokalemia 03/07/2020   Aortic atherosclerosis (HCC) 03/07/2020   MCI (mild cognitive impairment) 08/05/2019   Poor balance 12/16/2018   Fatigue 12/16/2018   Migraine 11/12/2018   Cervical radiculopathy due to degenerative joint disease of spine 10/28/2017   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   Osteopenia 09/25/2016   Estrogen deficiency 05/30/2016   Chronic constipation 01/18/2015   Acute bronchitis 11/15/2014   NSTEMI (non-ST elevated myocardial infarction) (HCC) 09/06/2014   Atherosclerosis of native coronary artery of native heart with angina pectoris (HCC) 09/06/2014   DM (diabetes mellitus) (HCC) 09/05/2014   History of breast cancer 06/02/2014   ADD (attention deficit disorder) 11/19/2012   Low back pain 08/05/2012   Hypothyroid 05/18/2012   Chronic sinusitis 01/24/2012   Asymptomatic postmenopausal status 06/22/2008   Vitamin D  deficiency 03/18/2008   BENIGN NEOPLASM OTH&UNSPEC SITE DIGESTIVE SYSTEM 01/09/2007   Diaphragmatic hernia 01/09/2007   History of colonic polyps 01/09/2007   Hyperlipidemia associated with type 2 diabetes mellitus (HCC) 01/07/2007   Essential hypertension 01/07/2007   Allergic rhinitis 01/07/2007   Asthma 01/07/2007   GERD 01/07/2007   ACNE ROSACEA 01/07/2007   MIGRAINES, HX  OF 01/07/2007   Fibromyalgia 10/30/2006   INSOMNIA 10/30/2006   Past Medical History:  Diagnosis Date   ADD (attention deficit disorder) 11/19/2012   Allergic rhinitis, cause unspecified    Anxiety  state, unspecified    Arthritis    Asymptomatic postmenopausal status (age-related) (natural)    Atypical hyperplasia of left breast 2000   Atypical mole 03/16/2018   R paraspinal mid upper back, excision   Atypical mole 02/17/2018   R spinal upper back, moderate   Atypical mole 08/11/2017   L sternum, excision   Atypical mole 05/27/2017   L post neck, moderate   Benign neoplasm of other and unspecified site of the digestive system    Bipolar disorder (HCC)    Breast cancer (HCC)    Left Breast 6mm invasive CA left uiq, dcis left uoq and a lot ADH, ALH in both breasts.   CAD (coronary artery disease)    a. 08/2014 NSTEMI/Cath: LM nl, LAD 50p, D1/D2 min irregs, LCX nl, OM1 40, LPDA nl, RI 99ost (2.58mm vessel->med Rx), RCA nl, AM 60, nl EF; b. 11/2016 MV: EF 74%, no ischemia (performed 2/2 dyspnea & new inf TWI).   Carcinoma of left breast (HCC) 06/02/2014   Cataract    Chronic diastolic CHF (congestive heart failure) (HCC)    a. 08/2014 Echo: EF 60-65%, Gr 1 DD, nl LA.   Clotting disorder (HCC)    on blood thiner, Plavix    Cyst    on Achilles tendon   Depression    Diabetes mellitus (HCC)    frank   Dysuria    Edema    Family history of malignant neoplasm of gastrointestinal tract    Fibromyalgia    GERD (gastroesophageal reflux disease)    Glaucoma    pre glaucoma, 05/14/2018 pt. denies glaucoma   Headache    migraines   Hiatal hernia    History of alcoholism (HCC)    History of migraines    History of ovarian cyst    Hx of adenomatous colonic polyps    Insomnia, unspecified    Myocardial infarction (HCC) 09-04-14   OCD (obsessive compulsive disorder)    Osteopenia 09/25/2016   Femoral neck T -1.1  9/18   Other screening mammogram    Pure hypercholesterolemia    Rosacea    Subacute confusional state 11/19/2012   Tubular adenoma of colon 01/10/11   Ulcerative colitis, unspecified    Unspecified asthma(493.90)    Unspecified essential hypertension    Unspecified  hypothyroidism    Unspecified vitamin D  deficiency    Vertigo    Past Surgical History:  Procedure Laterality Date   APPENDECTOMY     AXILLARY SENTINEL NODE BIOPSY Left 05/23/2014   Procedure: AXILLARY SENTINEL NODE BIOPSY;  Surgeon: Jerlean Mood, MD;  Location: [facility];  Service: General;  Laterality: Left;   BREAST BIOPSY  08/1998   on tamoxifen, atypical hyperplasia   BREAST BIOPSY Left 04/2014   BREAST SURGERY Left 08/1998   lumpectomy/ Dr Nicolette Barrio   BREAST SURGERY Bilateral 05/23/2014   Mastectomy   BUNIONECTOMY Right    CARDIAC CATHETERIZATION N/A 09/05/2014   Procedure: Left Heart Cath and Coronary Angiography;  Surgeon: Wenona Hamilton, MD;  Location: [facility];  Service: Cardiovascular;  Laterality: N/A;   CARDIAC CATHETERIZATION  09/05/2014   CATARACT EXTRACTION W/ INTRAOCULAR LENS IMPLANT Bilateral    CESAREAN SECTION  1996/1997   placenta previa, gest DM, pre-eclampsia   CHOLECYSTECTOMY  1997  adhesions also   COLONOSCOPY  01/2001   Ulcerative colitis   COLONOSCOPY  04/2004   UC, polyp   COLONOSCOPY  12/2006   UC, no polyps   CORONARY STENT INTERVENTION N/A 04/06/2020   Procedure: CORONARY STENT INTERVENTION;  Surgeon: Sammy Crisp, MD;  Location: [facility];  Service: Cardiovascular;  Laterality: N/A;   DEXA  04/1999 and 2010   normal   ESOPHAGOGASTRODUODENOSCOPY  09/2001   polyp   exercise stress test  11/2003   negative   FEMUR FRACTURE SURGERY Left    LEFT HEART CATH AND CORONARY ANGIOGRAPHY N/A 04/06/2020   Procedure: LEFT HEART CATH AND CORONARY ANGIOGRAPHY;  Surgeon: Devorah Fonder, MD;  Location: [facility];  Service: Cardiovascular;  Laterality: N/A;   MASTECTOMY Bilateral    MECKEL DIVERTICULUM EXCISION  1999   NASAL SINUS SURGERY  05/1997   nuclear stress test  06/2007   negative   precancerous mole removed     RIGHT AND LEFT HEART CATH     with 1 stent placed in March 2022   SIMPLE MASTECTOMY  WITH AXILLARY SENTINEL NODE BIOPSY Bilateral 05/23/2014   Procedure: Bilateral simple mastectomy, left sentinel node biopsy ;  Surgeon: Jerlean Mood, MD;  Location: [facility];  Service: General;  Laterality: Bilateral;   Sleep study  11/2007   no apnea, but did snore (done by [facility])   TONSILLECTOMY  1984   TUBAL LIGATION     WISDOM TOOTH EXTRACTION  1980's   Allergies  Allergen Reactions   Ephedrine Other (See Comments)    Pt becomes hyper   Oxycodone  Other (See Comments)    Panic Attack   Aspirin  Other (See Comments)    REACTION: aggrivates colitis Tolerates 81 mg but unable to take doses >81 mg   Erythromycin Other (See Comments)    REACTION: GI upset   Nsaids     REACTION: aggrivate colitis- GI ok'd for short term use   Other Other (See Comments)    Unable to take due to history of ulcerative colitis   Rosuvastatin Other (See Comments)    REACTION: increased lfts         05/13/2023   11:13 AM 04/25/2023    3:12 PM 04/10/2023    2:09 PM  Depression screen PHQ 2/9  Decreased Interest 0 0 0  Down, Depressed, Hopeless 0 0 0  PHQ - 2 Score 0 0 0  Altered sleeping 0 0 0  Tired, decreased energy 0 0 0  Change in appetite 0 0 0  Feeling bad or failure about yourself  0 0 0  Trouble concentrating 0 0 0  Moving slowly or fidgety/restless 0 0 0  Suicidal thoughts 0 0 0  PHQ-9 Score 0 0 0  Difficult doing work/chores Not difficult at all Not difficult at all Not difficult at all       05/13/2023   11:13 AM 04/25/2023    3:12 PM 04/10/2023    2:09 PM 08/13/2022   11:36 AM  GAD 7 : Generalized Anxiety Score  Nervous, Anxious, on Edge 0 0 0 0  Control/stop worrying 0 0 0 0  Worry too much - different things 0 0 0 0  Trouble relaxing 0 0 0 0  Restless 0 0 0 0  Easily annoyed or irritable 0 0 0 0  Afraid - awful might happen 0 0 0 0  Total GAD 7 Score 0 0 0 0  Anxiety Difficulty Not  difficult at all Not difficult at all Not difficult at all Not difficult at all       Review of Systems  Constitutional:  Negative for chills and fever.  HENT:  Positive for congestion. Negative for ear discharge, ear pain, hearing loss, sinus pain, sore throat and tinnitus.   Eyes:  Positive for pain, discharge and redness. Negative for blurred vision, double vision and photophobia.  Respiratory:  Positive for cough and wheezing. Negative for shortness of breath.   Cardiovascular:  Negative for chest pain.  Gastrointestinal:  Negative for abdominal pain, constipation, diarrhea, heartburn, nausea and vomiting.  Genitourinary:  Negative for dysuria, frequency and urgency.  Neurological:  Negative for dizziness and headaches.  Endo/Heme/Allergies:  Negative for polydipsia.  Psychiatric/Behavioral:  Negative for depression and suicidal ideas. The patient is not nervous/anxious.       Objective:     BP 124/84 (BP Location: Left Arm, Patient Position: Sitting, Cuff Size: Normal)   Pulse (!) 106   Temp 98 F (36.7 C) (Oral)   Ht 5' 4.6" (1.641 m)   Wt 154 lb (69.9 kg)   LMP 08/23/2006 (Approximate) Comment: tubal ligation  SpO2 96%   BMI 25.95 kg/m  BP Readings from Last 3 Encounters:  05/13/23 124/84  05/12/23 132/76  04/25/23 132/86   Wt Readings from Last 3 Encounters:  05/13/23 154 lb (69.9 kg)  05/12/23 153 lb (69.4 kg)  04/25/23 157 lb (71.2 kg)      Physical Exam Vitals and nursing note reviewed.  Constitutional:      Appearance: Normal appearance.  HENT:     Right Ear: Tympanic membrane, ear canal and external ear normal.     Left Ear: Tympanic membrane, ear canal and external ear normal.     Nose: Congestion present.     Mouth/Throat:     Mouth: Mucous membranes are moist.  Eyes:     General:        Right eye: Discharge present.     Extraocular Movements: Extraocular movements intact.     Pupils: Pupils are equal, round, and reactive to light.  Cardiovascular:     Rate and Rhythm: Normal rate and regular rhythm.     Pulses: Normal  pulses.     Heart sounds: Normal heart sounds.  Pulmonary:     Effort: Pulmonary effort is normal.     Breath sounds: Rales present.  Neurological:     Mental Status: She is alert and oriented to person, place, and time.  Psychiatric:        Mood and Affect: Mood normal.        Behavior: Behavior normal.        Thought Content: Thought content normal.        Judgment: Judgment normal.      No results found for any visits on 05/13/23.     The ASCVD Risk score (Arnett DK, et al., 2019) failed to calculate for the following reasons:   Risk score cannot be calculated because patient has a medical history suggesting prior/existing ASCVD    Assessment & Plan:  Bacterial conjunctivitis of right eye Assessment & Plan: Symptoms suggestive of bacterial conjunctivitis.   Given allergy to erythromycin, will treat with Polymyxin B-Trimethoprim  eye drops. Rx sent.  Er precautions provided.  F/u with ophthalmologist if symptoms worsen.  Orders: -     Polymyxin B-Trimethoprim ; Place 1 drop into right eye every 4 (four) hours for 7 days  Dispense: 10 mL; Refill: 0  Chronic sinusitis, unspecified location Assessment & Plan: Symptoms suggestive of acute on chronic sinusitis.   Recently treated with doxycycline  for bronchitis.  Symptoms had resolved and restarted Sunday.   Given presentation and symptoms, will treat.  Rx sent for Augmentin  for 7 days.  Discussed OTC medications for symptom management, rest and hydration.  F/u with pcp if symptoms worsen or do not improve. Er precautions provided.  Orders: -     Amoxicillin -Pot Clavulanate; Take 1 tablet by mouth 2 (two) times daily for 7 days.  Dispense: 14 tablet; Refill: 0     Return if symptoms worsen or fail to improve.    Jolanda Nation, NP

## 2023-05-13 NOTE — Assessment & Plan Note (Signed)
 Symptoms suggestive of acute on chronic sinusitis.   Recently treated with doxycycline  for bronchitis.  Symptoms had resolved and restarted Sunday.   Given presentation and symptoms, will treat.  Rx sent for Augmentin  for 7 days.  Discussed OTC medications for symptom management, rest and hydration.  F/u with pcp if symptoms worsen or do not improve. Er precautions provided.

## 2023-05-13 NOTE — Patient Instructions (Signed)
 Start Augmentin  antibiotics. Take 1 tablet by mouth twice daily for 7 days.  Start eye rops - one drop in right eye every four hours for 7 days.   Schedule follow up with PCP if not better.   It was a pleasure to see you today!

## 2023-05-13 NOTE — Assessment & Plan Note (Signed)
 Symptoms suggestive of bacterial conjunctivitis.   Given allergy to erythromycin, will treat with Polymyxin B-Trimethoprim  eye drops. Rx sent.  Er precautions provided.  F/u with ophthalmologist if symptoms worsen.

## 2023-05-16 LAB — CYTOLOGY - PAP
Comment: NEGATIVE
Diagnosis: NEGATIVE
High risk HPV: NEGATIVE

## 2023-05-18 ENCOUNTER — Encounter: Payer: Self-pay | Admitting: Family Medicine

## 2023-05-19 ENCOUNTER — Telehealth: Payer: Self-pay

## 2023-05-19 NOTE — Telephone Encounter (Signed)
 Called and spoke with pt with results of normal PaP and negative HPV results. Pt acknowledged understanding. No further concerns.

## 2023-05-19 NOTE — Telephone Encounter (Signed)
-----   Message from [place] sent at 05/18/2023  9:57 AM EDT ----- Pap is normal with negative HPV screen  Atrophic changes noted due to age

## 2023-05-23 ENCOUNTER — Inpatient Hospital Stay: Payer: BC Managed Care – PPO

## 2023-05-23 ENCOUNTER — Other Ambulatory Visit: Payer: Self-pay | Admitting: Family

## 2023-05-23 ENCOUNTER — Other Ambulatory Visit: Payer: Self-pay | Admitting: Internal Medicine

## 2023-05-23 ENCOUNTER — Inpatient Hospital Stay: Payer: BC Managed Care – PPO | Admitting: Internal Medicine

## 2023-05-23 DIAGNOSIS — R110 Nausea: Secondary | ICD-10-CM

## 2023-05-23 NOTE — Telephone Encounter (Signed)
 Last filled on 10/17/21 #90 tabs/ 3 refills   CPE was on 05/12/23

## 2023-05-29 ENCOUNTER — Other Ambulatory Visit: Payer: Self-pay | Admitting: *Deleted

## 2023-05-29 DIAGNOSIS — C50812 Malignant neoplasm of overlapping sites of left female breast: Secondary | ICD-10-CM

## 2023-06-02 ENCOUNTER — Inpatient Hospital Stay: Admitting: Internal Medicine

## 2023-06-02 ENCOUNTER — Inpatient Hospital Stay

## 2023-06-23 ENCOUNTER — Encounter: Payer: Self-pay | Admitting: Endocrinology

## 2023-06-23 ENCOUNTER — Other Ambulatory Visit: Payer: Self-pay

## 2023-06-23 DIAGNOSIS — E1169 Type 2 diabetes mellitus with other specified complication: Secondary | ICD-10-CM

## 2023-06-23 MED ORDER — EMPAGLIFLOZIN 25 MG PO TABS: ORAL_TABLET | ORAL | 2 refills | Status: DC

## 2023-07-01 ENCOUNTER — Other Ambulatory Visit: Payer: Self-pay | Admitting: Internal Medicine

## 2023-07-10 ENCOUNTER — Other Ambulatory Visit: Payer: Self-pay

## 2023-07-10 ENCOUNTER — Ambulatory Visit: Payer: Self-pay | Admitting: Endocrinology

## 2023-07-10 ENCOUNTER — Ambulatory Visit: Admitting: Endocrinology

## 2023-07-10 ENCOUNTER — Encounter: Payer: Self-pay | Admitting: Endocrinology

## 2023-07-10 VITALS — BP 128/82 | HR 103 | Resp 20 | Ht 64.5 in | Wt 144.0 lb

## 2023-07-10 DIAGNOSIS — E782 Mixed hyperlipidemia: Secondary | ICD-10-CM

## 2023-07-10 DIAGNOSIS — Z7985 Long-term (current) use of injectable non-insulin antidiabetic drugs: Secondary | ICD-10-CM

## 2023-07-10 DIAGNOSIS — E669 Obesity, unspecified: Secondary | ICD-10-CM

## 2023-07-10 DIAGNOSIS — E890 Postprocedural hypothyroidism: Secondary | ICD-10-CM

## 2023-07-10 DIAGNOSIS — E118 Type 2 diabetes mellitus with unspecified complications: Secondary | ICD-10-CM

## 2023-07-10 DIAGNOSIS — E1169 Type 2 diabetes mellitus with other specified complication: Secondary | ICD-10-CM

## 2023-07-10 DIAGNOSIS — Z7984 Long term (current) use of oral hypoglycemic drugs: Secondary | ICD-10-CM

## 2023-07-10 LAB — TSH: TSH: 0.11 m[IU]/L — ABNORMAL LOW (ref 0.40–4.50)

## 2023-07-10 LAB — POCT GLYCOSYLATED HEMOGLOBIN (HGB A1C): Hemoglobin A1C: 4.9 % (ref 4.0–5.6)

## 2023-07-10 LAB — T4, FREE: Free T4: 1.7 ng/dL (ref 0.8–1.8)

## 2023-07-10 MED ORDER — EMPAGLIFLOZIN 25 MG PO TABS
ORAL_TABLET | ORAL | 2 refills | Status: DC
Start: 2023-07-10 — End: 2023-08-18

## 2023-07-10 MED ORDER — TIRZEPATIDE 15 MG/0.5ML ~~LOC~~ SOAJ: 15.0000 mg | SUBCUTANEOUS | 4 refills | Status: DC

## 2023-07-10 NOTE — Progress Notes (Addendum)
 Outpatient Endocrinology Note [place] Aarica Wax, MD  07/11/23  Patient's Name: Lindsay Fry    DOB: Apr 02, 1960    MRN: [id number]                                                    REASON OF VISIT: Follow up for type 2 diabetes mellitus  PCP: Tower, Laine LABOR, MD  HISTORY OF PRESENT ILLNESS:   Lindsay Fry is a 63 y.o. old female with past medical history listed below, is here for follow up for type 2 diabetes mellitus.   Pertinent Diabetes History: Patient was previously seen by Dr. Von and was last time seen in August 2024.  Patient was diagnosed with type 2 diabetes mellitus in March 2016.  She has been on various antidiabetic medications.  Insulin  therapy was started in August 2017.  Patient has controlled type 2 diabetes mellitus.  Chronic Diabetes Complications : Retinopathy: no. Last ophthalmology exam was done on DUE.  Nephropathy: no Peripheral neuropathy: no.  Patient has been on gabapentin  for fibromyalgia. Coronary artery disease: yes Stroke: no  Relevant comorbidities and cardiovascular risk factors: Obesity: no Body mass index is 24.34 kg/m.  Hypertension: Yes  Hyperlipidemia : Yes, on statin.  She is on Repatha .  Current / Home Diabetic regimen includes:  Mounjaro  15 mg weekly. Jardiance  25 mg daily    Prior diabetic medications: Metformin  ER 2000 mg daily, stopped due to diarrhea.  She used to be on Janumet , Victoza  in the past.  Ozempic  was changed to Mounjaro .  Levemir  was changed to Tresiba  in 2024. Tresiba  15 units was stopped in March 2025 after improvement of diabetes control /mild hypoglycemia.  Glycemic data:   Ascensia Contour next one glucometer data from June 12 to July 10, 2023 downloaded and reviewed, average blood sugar 106.  She has been checking mostly once a day in the morning fasting, lowest blood sugar 91 and highest 126.  She declined continuous glucose monitoring.  Hypoglycemia: Patient has no hypoglycemic episodes. Patient  has hypoglycemia awareness\.  Factors modifying glucose control: 1.  Diabetic diet assessment: 3 meals a day.  2.  Staying active or exercising:   3.  Medication compliance: compliant all of the time.  # Postablative hypothyroidism -First diagnosed in 1992, after treatment for Graves' disease with RAI I-131.  She has been on relatively large dose of levothyroxine  supplement.  Dose has been adjusted.  Regularly in the past.  Lately she has been on levothyroxine  / Levoxyl  125 mcg : 2 tablets daily.  # She has osteopenia, last DEXA scan in February 2022, managed by primary care provider.  Interval history  Glucometer data as reviewed above.  Hemoglobin A1c 4.9% remained controlled.  Diabetes regimen as reviewed and noted above.  After stopping basal insulin  diabetes control has remained good.  She has been continuously losing weight.  Tolerating Mounjaro  well.  She has been taking levothyroxine  250 mcg daily.  TSH in April was 0.11, patient report dose has not been changed after that.  Patient denies palpitation and heat intolerance.  No other complaints today.  REVIEW OF SYSTEMS As per history of present illness.   PAST MEDICAL HISTORY: Past Medical History:  Diagnosis Date   ADD (attention deficit disorder) 11/19/2012   Allergic rhinitis, cause unspecified    Anxiety state, unspecified  Arthritis    Asymptomatic postmenopausal status (age-related) (natural)    Atypical hyperplasia of left breast 2000   Atypical mole 03/16/2018   R paraspinal mid upper back, excision   Atypical mole 02/17/2018   R spinal upper back, moderate   Atypical mole 08/11/2017   L sternum, excision   Atypical mole 05/27/2017   L post neck, moderate   Benign neoplasm of other and unspecified site of the digestive system    Bipolar disorder (HCC)    Breast cancer (HCC)    Left Breast 6mm invasive CA left uiq, dcis left uoq and a lot ADH, ALH in both breasts.   CAD (coronary artery disease)    a.  08/2014 NSTEMI/Cath: LM nl, LAD 50p, D1/D2 min irregs, LCX nl, OM1 40, LPDA nl, RI 99ost (2.50mm vessel->med Rx), RCA nl, AM 60, nl EF; b. 11/2016 MV: EF 74%, no ischemia (performed 2/2 dyspnea & new inf TWI).   Carcinoma of left breast (HCC) 06/02/2014   Cataract    Chronic diastolic CHF (congestive heart failure) (HCC)    a. 08/2014 Echo: EF 60-65%, Gr 1 DD, nl LA.   Clotting disorder (HCC)    on blood thiner, Plavix    Cyst    on Achilles tendon   Depression    Diabetes mellitus (HCC)    frank   Dysuria    Edema    Family history of malignant neoplasm of gastrointestinal tract    Fibromyalgia    GERD (gastroesophageal reflux disease)    Glaucoma    pre glaucoma, 05/14/2018 pt. denies glaucoma   Headache    migraines   Hiatal hernia    History of alcoholism (HCC)    History of migraines    History of ovarian cyst    Hx of adenomatous colonic polyps    Insomnia, unspecified    Myocardial infarction (HCC) 09-04-14   OCD (obsessive compulsive disorder)    Osteopenia 09/25/2016   Femoral neck T -1.1  9/18   Other screening mammogram    Pure hypercholesterolemia    Rosacea    Subacute confusional state 11/19/2012   Tubular adenoma of colon 01/10/11   Ulcerative colitis, unspecified    Unspecified asthma(493.90)    Unspecified essential hypertension    Unspecified hypothyroidism    Unspecified vitamin D  deficiency    Vertigo     PAST SURGICAL HISTORY: Past Surgical History:  Procedure Laterality Date   APPENDECTOMY     AXILLARY SENTINEL NODE BIOPSY Left 05/23/2014   Procedure: AXILLARY SENTINEL NODE BIOPSY;  Surgeon: Louanne KANDICE Muse, MD;  Location: [facility];  Service: General;  Laterality: Left;   BREAST BIOPSY  08/1998   on tamoxifen, atypical hyperplasia   BREAST BIOPSY Left 04/2014   BREAST SURGERY Left 08/1998   lumpectomy/ Dr Rollene   BREAST SURGERY Bilateral 05/23/2014   Mastectomy   BUNIONECTOMY Right    CARDIAC CATHETERIZATION N/A 09/05/2014   Procedure:  Left Heart Cath and Coronary Angiography;  Surgeon: Deatrice DELENA Cage, MD;  Location: [facility];  Service: Cardiovascular;  Laterality: N/A;   CARDIAC CATHETERIZATION  09/05/2014   CATARACT EXTRACTION W/ INTRAOCULAR LENS IMPLANT Bilateral    CESAREAN SECTION  1996/1997   placenta previa, gest DM, pre-eclampsia   CHOLECYSTECTOMY  1997   adhesions also   COLONOSCOPY  01/2001   Ulcerative colitis   COLONOSCOPY  04/2004   UC, polyp   COLONOSCOPY  12/2006   UC, no polyps   CORONARY STENT INTERVENTION N/A  04/06/2020   Procedure: CORONARY STENT INTERVENTION;  Surgeon: Mady Bruckner, MD;  Location: [facility];  Service: Cardiovascular;  Laterality: N/A;   DEXA  04/1999 and 2010   normal   ESOPHAGOGASTRODUODENOSCOPY  09/2001   polyp   exercise stress test  11/2003   negative   FEMUR FRACTURE SURGERY Left    LEFT HEART CATH AND CORONARY ANGIOGRAPHY N/A 04/06/2020   Procedure: LEFT HEART CATH AND CORONARY ANGIOGRAPHY;  Surgeon: Perla Evalene PARAS, MD;  Location: [facility];  Service: Cardiovascular;  Laterality: N/A;   MASTECTOMY Bilateral    MECKEL DIVERTICULUM EXCISION  1999   NASAL SINUS SURGERY  05/1997   nuclear stress test  06/2007   negative   precancerous mole removed     RIGHT AND LEFT HEART CATH     with 1 stent placed in March 2022   SIMPLE MASTECTOMY WITH AXILLARY SENTINEL NODE BIOPSY Bilateral 05/23/2014   Procedure: Bilateral simple mastectomy, left sentinel node biopsy ;  Surgeon: Louanne KANDICE Muse, MD;  Location: [facility];  Service: General;  Laterality: Bilateral;   Sleep study  11/2007   no apnea, but did snore (done by [facility])   TONSILLECTOMY  1984   TUBAL LIGATION     WISDOM TOOTH EXTRACTION  1980's    ALLERGIES: Allergies  Allergen Reactions   Ephedrine Other (See Comments)    Pt becomes hyper   Oxycodone  Other (See Comments)    Panic Attack   Aspirin  Other (See Comments)    REACTION: aggrivates colitis Tolerates 81 mg  but unable to take doses >81 mg   Erythromycin Other (See Comments)    REACTION: GI upset   Nsaids     REACTION: aggrivate colitis- GI ok'd for short term use   Other Other (See Comments)    Unable to take due to history of ulcerative colitis   Rosuvastatin Other (See Comments)    REACTION: increased lfts    FAMILY HISTORY:  Family History  Problem Relation Age of Onset   Coronary artery disease Father    Colon cancer Father    Alzheimer's disease Father    Dementia Father    Colon polyps Father    Coronary artery disease Mother    Hypertension Mother    Osteoporosis Mother    Crohn's disease Mother        crohns colitis   Breast cancer Other        great aunts   Coronary artery disease Other        Uncle (also AAA)   Diabetes Other        remote family history   Stroke Cousin    Esophageal cancer Neg Hx    Rectal cancer Neg Hx    Stomach cancer Neg Hx     SOCIAL HISTORY: Social History   Socioeconomic History   Marital status: Married    Spouse name: alan   Number of children: 2   Years of education: college   Highest education level: Bachelor's degree (e.g., BA, AB, BS)  Occupational History   Occupation: unemployed  Tobacco Use   Smoking status: Former    Current packs/day: 0.00    Average packs/day: 0.3 packs/day for 5.0 years (1.3 ttl pk-yrs)    Types: Cigarettes    Start date: 01/15/1988    Quit date: 01/14/1993    Years since quitting: 30.5   Smokeless tobacco: Never  Vaping Use   Vaping status: Never Used  Substance and  Sexual Activity   Alcohol use: No    Alcohol/week: 0.0 standard drinks of alcohol    Comment: Recovered ETOH   Drug use: No   Sexual activity: Not Currently    Partners: Male  Other Topics Concern   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      [employer] telephone triage   Social Drivers of Health   Financial Resource Strain:  Low Risk  (05/20/2022)   Overall Financial Resource Strain (CARDIA)    Difficulty of Paying Living Expenses: Not very hard  Food Insecurity: No Food Insecurity (05/20/2022)   Hunger Vital Sign    Worried About Running Out of Food in the Last Year: Never true    Ran Out of Food in the Last Year: Never true  Transportation Needs: No Transportation Needs (05/20/2022)   PRAPARE - Administrator, Civil Service (Medical): No    Lack of Transportation (Non-Medical): No  Physical Activity: Insufficiently Active (05/20/2022)   Exercise Vital Sign    Days of Exercise per Week: 2 days    Minutes of Exercise per Session: 30 min  Stress: No Stress Concern Present (05/20/2022)   [employer] of Occupational Health - Occupational Stress Questionnaire    Feeling of Stress : Only a little  Social Connections: Socially Integrated (05/20/2022)   Social Connection and Isolation Panel    Frequency of Communication with Friends and Family: More than three times a week    Frequency of Social Gatherings with Friends and Family: More than three times a week    Attends Religious Services: More than 4 times per year    Active Member of [employer] or Organizations: Yes    Attends Engineer, structural: More than 4 times per year    Marital Status: Married    MEDICATIONS:  Current Outpatient Medications  Medication Sig Dispense Refill   acetaminophen  (TYLENOL ) 650 MG CR tablet Take 1,300 mg by mouth 2 (two) times daily.     albuterol  (VENTOLIN  HFA) 108 (90 Base) MCG/ACT inhaler INHALE 1 PUFF INTO THE LUNGS EVERY 4 HOURS AS NEEDED FOR WHEEZING 1 each 3   amantadine  (SYMMETREL ) 100 MG capsule Take 100 mg by mouth 2 (two) times daily.     Ascorbic Acid  (VITAMIN C  PO) Take 1,000 mg by mouth daily.     aspirin  81 MG chewable tablet Chew 1 tablet (81 mg total) by mouth daily. 90 tablet 0   atorvastatin  (LIPITOR ) 40 MG tablet TAKE 1 TABLET BY MOUTH ONCE DAILY 90 tablet 3   B Complex Vitamins (VITAMIN B  COMPLEX PO) Take 1 tablet by mouth every other day.     BAYER MICROLET LANCETS lancets USE AS DIRECTED TO CHECK BLOOD SUGAR TWICE DAILY 200 each 2   Blood Glucose Monitoring Suppl (CONTOUR NEXT MONITOR) w/Device KIT 1 each by Does not apply route in the morning and at bedtime. Use Contour Next meter to check blood sugar twice daily. 1 kit 0   Calcium  Carb-Cholecalciferol  (CALTRATE 600+D3 PO) Take 1 tablet by mouth daily. 300 mg Vitamin D3     cyclobenzaprine  (FLEXERIL ) 5 MG tablet TAKE 1 TABLET BY MOUTH EVERY 8 HOURS AS NEEDED FOR MUSCLE SPASMS (BACK PAIN) 30 tablet 1   dextroamphetamine  (DEXEDRINE  SPANSULE) 15 MG 24 hr capsule Take 5 mg by mouth daily  as needed.     diazepam  (VALIUM ) 10 MG tablet Take 2.5-7.5 mg by mouth daily as needed for anxiety.     docusate sodium  (COLACE) 100 MG capsule Take 100-300 mg by mouth daily as needed for mild constipation or moderate constipation.     Evolocumab  (REPATHA  SURECLICK) 140 MG/ML SOAJ INJECT 140MG  SUBCUTANEOUSLY EVERY 2 WEEKS 6 mL 3   famotidine  (PEPCID ) 40 MG tablet Take 40 mg by mouth 2 (two) times daily. 30 min. Before meal and at bedtime     fluticasone  (FLONASE ) 50 MCG/ACT nasal spray Place 1 spray into both nostrils daily. 16 g 6   gabapentin  (NEURONTIN ) 300 MG capsule TAKE 1 CAPSULE BY MOUTH 3 TIMES DAILY 270 capsule 1   glucose blood (CONTOUR NEXT TEST) test strip Use to check blood sugar twice a day. 200 each 5   lamoTRIgine  (LAMICTAL ) 200 MG tablet Take 400 mg by mouth daily.     latanoprost  (XALATAN ) 0.005 % ophthalmic solution Place 1 drop into both eyes 2 (two) times a week.      loperamide  (IMODIUM  A-D) 2 MG tablet Take 2 mg by mouth daily as needed for diarrhea or loose stools.     Melatonin 10 MG CAPS Take 10 mg by mouth at bedtime.     mesalamine  (LIALDA ) 1.2 g EC tablet Take 2 tablets (2.4 g total) by mouth daily. Office visit for further refills 180 tablet 0   metoprolol  tartrate (LOPRESSOR ) 25 MG tablet Take 1 tablet (25 mg total) by  mouth 2 (two) times daily as needed (for tachycardia). 60 tablet 3   Multiple Vitamin (MULTIVITAMIN) tablet Take 1 tablet by mouth daily. Woman 50 plus     nitroGLYCERIN  (NITROSTAT ) 0.4 MG SL tablet Place 1 tablet (0.4 mg total) under the tongue every 5 (five) minutes as needed for chest pain. 25 tablet 3   ondansetron  (ZOFRAN -ODT) 4 MG disintegrating tablet DISSOLVE 1 TABLET ON THE TONGUE EVERY 6 HOURS AS NEEDED FOR NAUSEA OR VOMIT 90 tablet 1   pantoprazole  (PROTONIX ) 40 MG tablet TAKE 1 TABLET BY MOUTH TWICE DAILY 180 tablet 1   polyethylene glycol (MIRALAX  / GLYCOLAX ) 17 g packet Take 17 g by mouth 3 (three) times daily as needed for moderate constipation, mild constipation or severe constipation. In water  or beverage     Probiotic Product (CULTURELLE PROBIOTICS PO) Take 1 capsule by mouth daily. For woman     QUEtiapine  (SEROQUEL  XR) 400 MG 24 hr tablet Take 800 mg by mouth at bedtime.     simethicone  (MYLICON) 80 MG chewable tablet Chew 80 mg by mouth 4 (four) times daily.     trimethoprim -polymyxin b  (POLYTRIM ) ophthalmic solution Place 1 drop into right eye every 4 (four) hours for 7 days 10 mL 0   valACYclovir  (VALTREX ) 1000 MG tablet TAKE 1 TABLET BY MOUTH TWICE DAILY FOR 5DAYS AS NEEDED FOR OUTBREAK 90 tablet 0   VRAYLAR  capsule Take 3 mg by mouth at bedtime.     zaleplon (SONATA) 10 MG capsule Take 10-20 mg by mouth at bedtime.     empagliflozin  (JARDIANCE ) 25 MG TABS tablet TAKE 1 TABLET BY MOUTH ONCE DAILY 14 tablet 2   levothyroxine  (LEVOXYL ) 200 MCG tablet Take 1 tablet (200 mcg total) by mouth daily before breakfast. 90 tablet 3   tirzepatide  (MOUNJARO ) 15 MG/0.5ML Pen Inject 15 mg into the skin once a week. 6 mL 4   No current facility-administered medications for this visit.    PHYSICAL EXAM: Vitals:  07/10/23 1314  BP: 128/82  Pulse: (!) 103  Resp: 20  SpO2: 96%  Weight: 144 lb (65.3 kg)  Height: 5' 4.5 (1.638 m)     Body mass index is 24.34 kg/m.  Wt Readings  from Last 3 Encounters:  07/10/23 144 lb (65.3 kg)  05/13/23 154 lb (69.9 kg)  05/12/23 153 lb (69.4 kg)    General: Well developed, well nourished female in no apparent distress.  HEENT: AT/[place], no external lesions.  Eyes: Conjunctiva clear and no icterus. Neck: Neck supple  Lungs: Respirations not labored Neurologic: Alert, oriented, normal speech Extremities / Skin: Dry.  Psychiatric: Does not appear depressed or anxious  Diabetic Foot Exam - Simple   No data filed     LABS Reviewed Lab Results  Component Value Date   HGBA1C 4.9 07/10/2023   HGBA1C 4.8 04/07/2023   HGBA1C 5.2 12/20/2022   Lab Results  Component Value Date   FRUCTOSAMINE 260 02/07/2020   FRUCTOSAMINE 220 01/25/2015   Lab Results  Component Value Date   CHOL 122 05/09/2023   HDL 55.50 05/09/2023   LDLCALC 34 05/09/2023   LDLDIRECT 84.0 12/04/2021   TRIG 162.0 (H) 05/09/2023   CHOLHDL 2 05/09/2023   Lab Results  Component Value Date   MICRALBCREAT 19 04/07/2023   MICRALBCREAT 2.2 04/29/2022   Lab Results  Component Value Date   CREATININE 0.71 05/09/2023   Lab Results  Component Value Date   GFR 89.62 05/09/2023    ASSESSMENT / PLAN  1. Controlled type 2 diabetes mellitus with complication, without long-term current use of insulin  (HCC)   2. Postablative hypothyroidism   3. Mixed hyperlipidemia   4. Type 2 diabetes mellitus with obesity (HCC)     Diabetes Mellitus type 2, complicated by CAD. - Diabetic status / severity: Controlled.  Lab Results  Component Value Date   HGBA1C 4.9 07/10/2023    - Hemoglobin A1c goal : <6.5%  Diabetes has remained controlled off of insulin .  - Medications:  Diabetes regimen: Continue Mounjaro  15 mg weekly. Continue Jardiance  25 mg daily.  Advised for well hydration.  - Home glucose testing: Check at least few times a week.  - Discussed/ Gave Hypoglycemia treatment plan.  # Consult : not required at this time.   # Annual urine for  microalbuminuria/ creatinine ratio, no microalbuminuria currently.    Last  Lab Results  Component Value Date   MICRALBCREAT 19 04/07/2023    # Foot check nightly.  # Annual dilated diabetic eye exams.  Advised to have diabetic eye exam.  - Diet: Make healthy diabetic food choices - Life style / activity / exercise: Discussed.  2. Blood pressure  -  BP Readings from Last 1 Encounters:  07/10/23 128/82    - Control is in target.  - No change in current plans.  3. Lipid status / Hyperlipidemia - Last  Lab Results  Component Value Date   LDLCALC 34 05/09/2023   - Continue atorvastatin  40 mg daily and Repatha .  Managed by cardiology.  Patient reports Repatha  was declined, advised to talk with cardiology for ongoing treatment including with Repatha .  # Postablative hypothyroidism -Currently taking Levoxyl  250 mcg daily. - She had low TSH of 0.11 in April.  Due to weight loss anticipate to require low-dose of levothyroxine  /  Levoxyl . - Will check TSH and free T4 today and adjust the dose of Levoxyl .  She needs refill.  Diagnoses and all orders for this visit:  Controlled type  2 diabetes mellitus with complication, without long-term current use of insulin  (HCC) -     POCT glycosylated hemoglobin (Hb A1C) -     tirzepatide  (MOUNJARO ) 15 MG/0.5ML Pen; Inject 15 mg into the skin once a week.  Postablative hypothyroidism -     T4, free -     TSH -     T4, free -     TSH -     levothyroxine  (LEVOXYL ) 200 MCG tablet; Take 1 tablet (200 mcg total) by mouth daily before breakfast.  Mixed hyperlipidemia  Type 2 diabetes mellitus with obesity (HCC) -     empagliflozin  (JARDIANCE ) 25 MG TABS tablet; TAKE 1 TABLET BY MOUTH ONCE DAILY   Addendum : Thyroid  lab with low TSH 7 high normal free T4) currently taking Levoxyl  / levothyroxine  250 mcg daily.  Decrease levothyroxine /Levoxyl  to 200 mcg daily.  Check TSH, free T4 in 6-8 weeks.   Latest Reference Range & Units 07/10/23 13:36   TSH 0.40 - 4.50 mIU/L 0.11 (L)  T4,Free(Direct) 0.8 - 1.8 ng/dL 1.7  (L): Data is abnormally low DISPOSITION Follow up in clinic in 4 months suggested.   All questions answered and patient verbalized understanding of the plan.  [place] Miguelangel Korn, MD [facility] [address] [place], [address] [place], [place] [address] Phone # [phone]  At least part of this note was generated using voice recognition software. Inadvertent word errors may have occurred, which were not recognized during the proofreading process.

## 2023-07-11 MED ORDER — LEVOTHYROXINE SODIUM 200 MCG PO TABS: 200.0000 ug | ORAL_TABLET | Freq: Every day | ORAL | 3 refills | Status: DC

## 2023-07-11 NOTE — Addendum Note (Signed)
 Addended by: Perrie Ragin, [place] on: 07/11/2023 08:18 AM   Modules accepted: Orders

## 2023-07-22 ENCOUNTER — Other Ambulatory Visit: Payer: Self-pay | Admitting: Family Medicine

## 2023-07-29 ENCOUNTER — Telehealth

## 2023-07-29 DIAGNOSIS — N3090 Cystitis, unspecified without hematuria: Secondary | ICD-10-CM

## 2023-07-29 MED ORDER — CEPHALEXIN 500 MG PO CAPS: 500.0000 mg | ORAL_CAPSULE | Freq: Two times a day (BID) | ORAL | 0 refills | Status: AC

## 2023-07-29 NOTE — Progress Notes (Signed)
 Lindsay Fry, Lindsay Fry are scheduled for a virtual visit with your provider today.    Just as we do with appointments in the office, we must obtain your consent to participate.  Your consent will be active for this visit and any virtual visit you may have with one of our providers in the next 365 days.    If you have a MyChart account, I can also send a copy of this consent to you electronically.  All virtual visits are billed to your insurance company just like a traditional visit in the office.  As this is a virtual visit, video technology does not allow for your provider to perform a traditional examination.  This may limit your provider's ability to fully assess your condition.  If your provider identifies any concerns that need to be evaluated in person or the need to arrange testing such as labs, EKG, etc, we will make arrangements to do so.    Although advances in technology are sophisticated, we cannot ensure that it will always work on either your end or our end.  If the connection with a video visit is poor, we may have to switch to a telephone visit.  With either a video or telephone visit, we are not always able to ensure that we have a secure connection.   I need to obtain your verbal consent now.   Are you willing to proceed with your visit today?   Lindsay Fry has provided verbal consent on 07/29/2023 for a virtual visit (video or telephone).   [name], PA-C 07/29/2023  12:04 PM   Date:  07/29/2023   ID:  [name], Lindsay Fry 09, 1962, MRN [id number]  Patient Location: Home Provider Location: Home Office   Participants: Patient and Provider for Visit and Wrap up  Method of visit: Video  Location of Patient: Home Location of Provider: Home Office Consent was obtain for visit over the video. Services rendered by provider: Visit was performed via video  A video enabled telemedicine application was used and I verified that I am speaking with the correct person using  two identifiers.  PCP:  Randeen Laine LABOR, MD   Chief Complaint:  dysuria  History of Present Illness:    Lindsay Fry is a 63 y.o. female with history as stated below. Presents video telehealth for an acute care visit  Pt states she started having dysuria, frequency and urgency that felt similar to previous uti. She reports she was taking otc azo which improved symptoms temporarily however symptoms recurred.  Denies fevers, chills, nausea, vomiting, flank pain.  Past Medical, Surgical, Social History, Allergies, and Medications have been Reviewed.  Past Medical History:  Diagnosis Date   ADD (attention deficit disorder) 11/19/2012   Allergic rhinitis, cause unspecified    Anxiety state, unspecified    Arthritis    Asymptomatic postmenopausal status (age-related) (natural)    Atypical hyperplasia of left breast 2000   Atypical mole 03/16/2018   R paraspinal mid upper back, excision   Atypical mole 02/17/2018   R spinal upper back, moderate   Atypical mole 08/11/2017   L sternum, excision   Atypical mole 05/27/2017   L post neck, moderate   Benign neoplasm of other and unspecified site of the digestive system    Bipolar disorder (HCC)    Breast cancer (HCC)    Left Breast 6mm invasive CA left uiq, dcis left uoq and a lot ADH, ALH in both breasts.   CAD (coronary artery disease)  a. 08/2014 NSTEMI/Cath: LM nl, LAD 50p, D1/D2 min irregs, LCX nl, OM1 40, LPDA nl, RI 99ost (2.11mm vessel->med Rx), RCA nl, AM 60, nl EF; b. 11/2016 MV: EF 74%, no ischemia (performed 2/2 dyspnea & new inf TWI).   Carcinoma of left breast (HCC) 06/02/2014   Cataract    Chronic diastolic CHF (congestive heart failure) (HCC)    a. 08/2014 Echo: EF 60-65%, Gr 1 DD, nl LA.   Clotting disorder (HCC)    on blood thiner, Plavix    Cyst    on Achilles tendon   Depression    Diabetes mellitus (HCC)    frank   Dysuria    Edema    Family history of malignant neoplasm of gastrointestinal tract     Fibromyalgia    GERD (gastroesophageal reflux disease)    Glaucoma    pre glaucoma, 05/14/2018 pt. denies glaucoma   Headache    migraines   Hiatal hernia    History of alcoholism (HCC)    History of migraines    History of ovarian cyst    Hx of adenomatous colonic polyps    Insomnia, unspecified    Myocardial infarction (HCC) 09-04-14   OCD (obsessive compulsive disorder)    Osteopenia 09/25/2016   Femoral neck T -1.1  9/18   Other screening mammogram    Pure hypercholesterolemia    Rosacea    Subacute confusional state 11/19/2012   Tubular adenoma of colon 01/10/11   Ulcerative colitis, unspecified    Unspecified asthma(493.90)    Unspecified essential hypertension    Unspecified hypothyroidism    Unspecified vitamin D  deficiency    Vertigo     No outpatient medications have been marked as taking for the 07/29/23 encounter (Video Visit) with [facility] PROVIDER.     Allergies:   Ephedrine, Oxycodone , Aspirin , Erythromycin, Nsaids, Other, and Rosuvastatin   ROS See HPI for history of present illness.  Physical Exam Constitutional:      General: She is not in acute distress.    Appearance: Normal appearance. She is not ill-appearing.  Neurological:     Mental Status: She is alert.               MDM: Pt with uti sxs. Suspect uti. Doubt pyelonephritis. Will tx with abx. Advised on plan for follow up with pcp  Tests Ordered: No orders of the defined types were placed in this encounter.   Medication Changes: No orders of the defined types were placed in this encounter.    Disposition:  Follow up  Signed, [name], PA-C  07/29/2023 12:04 PM

## 2023-07-29 NOTE — Patient Instructions (Addendum)
 Lindsay Fry, thank you for joining Lindsay GORMAN Snuffer, PA-C for today's virtual visit.  While this provider is not your primary care provider (PCP), if your PCP is located in our provider database this encounter information will be shared with them immediately following your visit.   A [facility] MyChart account gives you access to today's visit and all your visits, tests, and labs performed at [facility]  click here if you don't have a [facility] MyChart account or go to [email or web address]  Consent: (Patient) Lindsay Fry provided verbal consent for this virtual visit at the beginning of the encounter.  Current Medications:  Current Outpatient Medications:    acetaminophen  (TYLENOL ) 650 MG CR tablet, Take 1,300 mg by mouth 2 (two) times daily., Disp: , Rfl:    albuterol  (VENTOLIN  HFA) 108 (90 Base) MCG/ACT inhaler, INHALE 1 PUFF INTO THE LUNGS EVERY 4 HOURS AS NEEDED FOR WHEEZING, Disp: 1 each, Rfl: 3   amantadine  (SYMMETREL ) 100 MG capsule, Take 100 mg by mouth 2 (two) times daily., Disp: , Rfl:    Ascorbic Acid  (VITAMIN C  PO), Take 1,000 mg by mouth daily., Disp: , Rfl:    aspirin  81 MG chewable tablet, Chew 1 tablet (81 mg total) by mouth daily., Disp: 90 tablet, Rfl: 0   atorvastatin  (LIPITOR ) 40 MG tablet, TAKE 1 TABLET BY MOUTH ONCE DAILY, Disp: 90 tablet, Rfl: 3   B Complex Vitamins (VITAMIN B COMPLEX PO), Take 1 tablet by mouth every other day., Disp: , Rfl:    BAYER MICROLET LANCETS lancets, USE AS DIRECTED TO CHECK BLOOD SUGAR TWICE DAILY, Disp: 200 each, Rfl: 2   Blood Glucose Monitoring Suppl (CONTOUR NEXT MONITOR) w/Device KIT, 1 each by Does not apply route in the morning and at bedtime. Use Contour Next meter to check blood sugar twice daily., Disp: 1 kit, Rfl: 0   Calcium  Carb-Cholecalciferol  (CALTRATE 600+D3 PO), Take 1 tablet by mouth daily. 300 mg Vitamin D3, Disp: , Rfl:    cyclobenzaprine  (FLEXERIL ) 5 MG tablet, TAKE 1 TABLET BY MOUTH  EVERY 8 HOURS AS NEEDED FOR MUSCLE SPASMS (BACK PAIN), Disp: 30 tablet, Rfl: 1   dextroamphetamine  (DEXEDRINE  SPANSULE) 15 MG 24 hr capsule, Take 5 mg by mouth daily as needed., Disp: , Rfl:    diazepam  (VALIUM ) 10 MG tablet, Take 2.5-7.5 mg by mouth daily as needed for anxiety., Disp: , Rfl:    docusate sodium  (COLACE) 100 MG capsule, Take 100-300 mg by mouth daily as needed for mild constipation or moderate constipation., Disp: , Rfl:    empagliflozin  (JARDIANCE ) 25 MG TABS tablet, TAKE 1 TABLET BY MOUTH ONCE DAILY, Disp: 14 tablet, Rfl: 2   Evolocumab  (REPATHA  SURECLICK) 140 MG/ML SOAJ, INJECT 140MG  SUBCUTANEOUSLY EVERY 2 WEEKS, Disp: 6 mL, Rfl: 3   famotidine  (PEPCID ) 40 MG tablet, Take 40 mg by mouth 2 (two) times daily. 30 min. Before meal and at bedtime, Disp: , Rfl:    fluticasone  (FLONASE ) 50 MCG/ACT nasal spray, Place 1 spray into both nostrils daily., Disp: 16 g, Rfl: 6   gabapentin  (NEURONTIN ) 300 MG capsule, TAKE 1 CAPSULE BY MOUTH 3 TIMES DAILY, Disp: 270 capsule, Rfl: 1   glucose blood (CONTOUR NEXT TEST) test strip, Use to check blood sugar twice a day., Disp: 200 each, Rfl: 5   lamoTRIgine  (LAMICTAL ) 200 MG tablet, Take 400 mg by mouth daily., Disp: , Rfl:    latanoprost  (XALATAN ) 0.005 % ophthalmic solution, Place 1 drop into both eyes 2 (two) times  a week. , Disp: , Rfl:    levothyroxine  (LEVOXYL ) 200 MCG tablet, Take 1 tablet (200 mcg total) by mouth daily before breakfast., Disp: 90 tablet, Rfl: 3   loperamide  (IMODIUM  A-D) 2 MG tablet, Take 2 mg by mouth daily as needed for diarrhea or loose stools., Disp: , Rfl:    Melatonin 10 MG CAPS, Take 10 mg by mouth at bedtime., Disp: , Rfl:    mesalamine  (LIALDA ) 1.2 g EC tablet, Take 2 tablets (2.4 g total) by mouth daily. Office visit for further refills, Disp: 180 tablet, Rfl: 0   metoprolol  tartrate (LOPRESSOR ) 25 MG tablet, Take 1 tablet (25 mg total) by mouth 2 (two) times daily as needed (for tachycardia)., Disp: 60 tablet, Rfl:  3   Multiple Vitamin (MULTIVITAMIN) tablet, Take 1 tablet by mouth daily. Woman 50 plus, Disp: , Rfl:    nitroGLYCERIN  (NITROSTAT ) 0.4 MG SL tablet, Place 1 tablet (0.4 mg total) under the tongue every 5 (five) minutes as needed for chest pain., Disp: 25 tablet, Rfl: 3   ondansetron  (ZOFRAN -ODT) 4 MG disintegrating tablet, DISSOLVE 1 TABLET ON THE TONGUE EVERY 6 HOURS AS NEEDED FOR NAUSEA OR VOMIT, Disp: 90 tablet, Rfl: 1   pantoprazole  (PROTONIX ) 40 MG tablet, TAKE 1 TABLET BY MOUTH TWICE DAILY, Disp: 180 tablet, Rfl: 1   polyethylene glycol (MIRALAX  / GLYCOLAX ) 17 g packet, Take 17 g by mouth 3 (three) times daily as needed for moderate constipation, mild constipation or severe constipation. In water  or beverage, Disp: , Rfl:    Probiotic Product (CULTURELLE PROBIOTICS PO), Take 1 capsule by mouth daily. For woman, Disp: , Rfl:    QUEtiapine  (SEROQUEL  XR) 400 MG 24 hr tablet, Take 800 mg by mouth at bedtime., Disp: , Rfl:    simethicone  (MYLICON) 80 MG chewable tablet, Chew 80 mg by mouth 4 (four) times daily., Disp: , Rfl:    tirzepatide  (MOUNJARO ) 15 MG/0.5ML Pen, Inject 15 mg into the skin once a week., Disp: 6 mL, Rfl: 4   trimethoprim -polymyxin b  (POLYTRIM ) ophthalmic solution, Place 1 drop into right eye every 4 (four) hours for 7 days, Disp: 10 mL, Rfl: 0   valACYclovir  (VALTREX ) 1000 MG tablet, TAKE 1 TABLET BY MOUTH TWICE DAILY FOR 5DAYS AS NEEDED FOR OUTBREAK, Disp: 90 tablet, Rfl: 0   VRAYLAR  capsule, Take 3 mg by mouth at bedtime., Disp: , Rfl:    zaleplon (SONATA) 10 MG capsule, Take 10-20 mg by mouth at bedtime., Disp: , Rfl:    Medications ordered in this encounter:  No orders of the defined types were placed in this encounter.    *If you need refills on other medications prior to your next appointment, please contact your pharmacy*  Follow-Up: Call back or seek an in-person evaluation if the symptoms worsen or if the condition fails to improve as anticipated.  [facility] [phone]  Other Instructions You were given a prescription for antibiotics. Please take the antibiotic prescription fully.    Follow up with your regular doctor in 1 week for reassessment and seek care sooner if your symptoms worsen or fail to improve.  If you have been instructed to have an in-person evaluation today at a local Urgent Care facility, please use the link below. It will take you to a list of all of our available [facility], including address, phone number and hours of operation. Please do not delay care.  [facility]  If you or a  family member do not have a primary care provider, use the link below to schedule a visit and establish care. When you choose a [facility] primary care physician or advanced practice provider, you gain a long-term partner in health. Find a Primary Care Provider  Learn more about [facility]'s in-office and virtual care options:  - Get Care Now

## 2023-07-31 ENCOUNTER — Encounter: Payer: Self-pay | Admitting: Family Medicine

## 2023-07-31 NOTE — Telephone Encounter (Signed)
 Needs a visit in person with first available please  I think her treatment was from a virtual visit

## 2023-08-01 ENCOUNTER — Ambulatory Visit
Admission: RE | Admit: 2023-08-01 | Discharge: 2023-08-01 | Disposition: A | Discharge status: Home / Self Care | Source: Ambulatory Visit | Attending: Emergency Medicine | Admitting: Emergency Medicine

## 2023-08-01 VITALS — BP 113/70 | HR 104 | Temp 98.5°F | Resp 16 | Ht 66.0 in | Wt 140.0 lb

## 2023-08-01 DIAGNOSIS — R350 Frequency of micturition: Secondary | ICD-10-CM | POA: Diagnosis present

## 2023-08-01 DIAGNOSIS — N3000 Acute cystitis without hematuria: Secondary | ICD-10-CM | POA: Diagnosis not present

## 2023-08-01 LAB — POCT URINALYSIS DIP (MANUAL ENTRY)
Blood, UA: NEGATIVE
Glucose, UA: 500 mg/dL — AB
Nitrite, UA: POSITIVE — AB
Protein Ur, POC: 100 mg/dL — AB
Spec Grav, UA: 1.015 (ref 1.010–1.025)
Urobilinogen, UA: 2 U/dL — AB
pH, UA: 5 (ref 5.0–8.0)

## 2023-08-01 MED ORDER — CIPROFLOXACIN HCL 500 MG PO TABS: 500.0000 mg | ORAL_TABLET | Freq: Two times a day (BID) | ORAL | 0 refills | Status: AC

## 2023-08-01 NOTE — Telephone Encounter (Unsigned)
 Copied from CRM [id number]. Topic: Clinical - Medical Advice >> Jul 31, 2023  1:56 PM Chiquita SQUIBB wrote: Reason for CRM: Patient is calling in stating she has been taking the medication twice a day and the issue came back yesterday, and it is as bad as it was when having her virtual visit on Tuesday. Patient is asking if she needs to have an urinalysis done, as she has not improved and would also like to know if she should go to urgent care. Patient did deny NT.    ----------------------------------------------------------------------- From previous Reason for Contact - Scheduling: Patient/patient representative is calling to schedule an appointment. Refer to attachments for appointment information.

## 2023-08-01 NOTE — Discharge Instructions (Addendum)
 Your urinalysis shows Lindsay Fry blood cells and nitrates which are indicative of infection, your urine will be sent to the lab to determine exactly which bacteria is present, if any changes need to be made to your medications you will be notified  Begin use of ciprofloxacin  twice daily for 5 days, stop Keflex   You may use over-the-counter Azo to help minimize your symptoms until antibiotic removes bacteria, this medication will turn your urine orange  Increase your fluid intake through use of water   As always practice good hygiene, wiping front to back and avoidance of scented vaginal products to prevent further irritation  If symptoms continue to persist after use of medication or recur please follow-up with urgent care or your primary doctor as needed

## 2023-08-01 NOTE — ED Provider Notes (Signed)
 Lindsay Fry    CSN: [id number] Arrival date & time: 08/01/23  1340      History   Chief Complaint Chief Complaint  Patient presents with   Urinary Frequency    Had a virtual visit with Lindsay Fry Tuesday the 15th at 12:00. Had had urinary frequency, urgency and burning since last Friday night. Taking AZO maximum strength 3 x d since then.On cephalexin  7/15 no help Fri night - Entered by patient    HPI Lindsay Fry is a 63 y.o. female.   Patient presents for evaluation of urinary frequency, dysuria and pelvic pressure present for 6 days.  Completed virtual visit on July 29, 2023, prescribed cephalexin , additionally has been taking Azo.  Some improvement but symptoms have persisted.  Denies hematuria, abdominal or flank pain, fever or vaginal symptoms.  Past Medical History:  Diagnosis Date   ADD (attention deficit disorder) 11/19/2012   Allergic rhinitis, cause unspecified    Anxiety state, unspecified    Arthritis    Asymptomatic postmenopausal status (age-related) (natural)    Atypical hyperplasia of left breast 2000   Atypical mole 03/16/2018   R paraspinal mid upper back, excision   Atypical mole 02/17/2018   R spinal upper back, moderate   Atypical mole 08/11/2017   L sternum, excision   Atypical mole 05/27/2017   L post neck, moderate   Benign neoplasm of other and unspecified site of the digestive system    Bipolar disorder (HCC)    Breast cancer (HCC)    Left Breast 6mm invasive CA left uiq, dcis left uoq and a lot ADH, ALH in both breasts.   CAD (coronary artery disease)    a. 08/2014 NSTEMI/Cath: LM nl, LAD 50p, D1/D2 min irregs, LCX nl, OM1 40, LPDA nl, RI 99ost (2.89mm vessel->med Rx), RCA nl, AM 60, nl EF; b. 11/2016 MV: EF 74%, no ischemia (performed 2/2 dyspnea & new inf TWI).   Carcinoma of left breast (HCC) 06/02/2014   Cataract    Chronic diastolic CHF (congestive heart failure) (HCC)    a. 08/2014 Echo: EF 60-65%, Gr 1 DD, nl LA.   Clotting  disorder (HCC)    on blood thiner, Plavix    Cyst    on Achilles tendon   Depression    Diabetes mellitus (HCC)    frank   Dysuria    Edema    Family history of malignant neoplasm of gastrointestinal tract    Fibromyalgia    GERD (gastroesophageal reflux disease)    Glaucoma    pre glaucoma, 05/14/2018 pt. denies glaucoma   Headache    migraines   Hiatal hernia    History of alcoholism (HCC)    History of migraines    History of ovarian cyst    Hx of adenomatous colonic polyps    Insomnia, unspecified    Myocardial infarction (HCC) 09-04-14   OCD (obsessive compulsive disorder)    Osteopenia 09/25/2016   Femoral neck T -1.1  9/18   Other screening mammogram    Pure hypercholesterolemia    Rosacea    Subacute confusional state 11/19/2012   Tubular adenoma of colon 01/10/11   Ulcerative colitis, unspecified    Unspecified asthma(493.90)    Unspecified essential hypertension    Unspecified hypothyroidism    Unspecified vitamin D  deficiency    Vertigo     Patient Active Problem List   Diagnosis Date Noted   Bacterial conjunctivitis of right eye 05/13/2023   Encounter for gynecological examination 05/12/2023  Generalized body aches 04/18/2023   Shoulder pain, right 08/07/2022   Ulcerative (chronic) pancolitis without complications (HCC) 04/29/2022   Stress reaction 10/29/2021   Bipolar 2 disorder (HCC) 04/17/2021   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   Elevated transaminase level 03/27/2020   Current use of proton pump inhibitor 03/19/2020   Hypokalemia 03/07/2020   Aortic atherosclerosis (HCC) 03/07/2020   MCI (mild cognitive impairment) 08/05/2019   Poor balance 12/16/2018   Fatigue 12/16/2018   Migraine 11/12/2018   Cervical radiculopathy due to degenerative joint disease of spine 10/28/2017   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   Osteopenia 09/25/2016   Estrogen deficiency 05/30/2016   Chronic constipation 01/18/2015   Acute bronchitis  11/15/2014   NSTEMI (non-ST elevated myocardial infarction) (HCC) 09/06/2014   Atherosclerosis of native coronary artery of native heart with angina pectoris (HCC) 09/06/2014   DM (diabetes mellitus) (HCC) 09/05/2014   History of breast cancer 06/02/2014   ADD (attention deficit disorder) 11/19/2012   Low back pain 08/05/2012   Hypothyroid 05/18/2012   Chronic sinusitis 01/24/2012   Asymptomatic postmenopausal status 06/22/2008   Vitamin D  deficiency 03/18/2008   BENIGN NEOPLASM OTH&UNSPEC SITE DIGESTIVE SYSTEM 01/09/2007   Diaphragmatic hernia 01/09/2007   History of colonic polyps 01/09/2007   Hyperlipidemia associated with type 2 diabetes mellitus (HCC) 01/07/2007   Essential hypertension 01/07/2007   Allergic rhinitis 01/07/2007   Asthma 01/07/2007   GERD 01/07/2007   ACNE ROSACEA 01/07/2007   MIGRAINES, HX OF 01/07/2007   Fibromyalgia 10/30/2006   INSOMNIA 10/30/2006    Past Surgical History:  Procedure Laterality Date   APPENDECTOMY     AXILLARY SENTINEL NODE BIOPSY Left 05/23/2014   Procedure: AXILLARY SENTINEL NODE BIOPSY;  Surgeon: Louanne KANDICE Muse, MD;  Location: [facility];  Service: General;  Laterality: Left;   BREAST BIOPSY  08/1998   on tamoxifen, atypical hyperplasia   BREAST BIOPSY Left 04/2014   BREAST SURGERY Left 08/1998   lumpectomy/ Dr Rollene   BREAST SURGERY Bilateral 05/23/2014   Mastectomy   BUNIONECTOMY Right    CARDIAC CATHETERIZATION N/A 09/05/2014   Procedure: Left Heart Cath and Coronary Angiography;  Surgeon: Deatrice DELENA Cage, MD;  Location: [facility];  Service: Cardiovascular;  Laterality: N/A;   CARDIAC CATHETERIZATION  09/05/2014   CATARACT EXTRACTION W/ INTRAOCULAR LENS IMPLANT Bilateral    CESAREAN SECTION  1996/1997   placenta previa, gest DM, pre-eclampsia   CHOLECYSTECTOMY  1997   adhesions also   COLONOSCOPY  01/2001   Ulcerative colitis   COLONOSCOPY  04/2004   UC, polyp   COLONOSCOPY  12/2006   UC, no polyps    CORONARY STENT INTERVENTION N/A 04/06/2020   Procedure: CORONARY STENT INTERVENTION;  Surgeon: Mady Bruckner, MD;  Location: [facility];  Service: Cardiovascular;  Laterality: N/A;   DEXA  04/1999 and 2010   normal   ESOPHAGOGASTRODUODENOSCOPY  09/2001   polyp   exercise stress test  11/2003   negative   FEMUR FRACTURE SURGERY Left    LEFT HEART CATH AND CORONARY ANGIOGRAPHY N/A 04/06/2020   Procedure: LEFT HEART CATH AND CORONARY ANGIOGRAPHY;  Surgeon: Perla Evalene PARAS, MD;  Location: [facility];  Service: Cardiovascular;  Laterality: N/A;   MASTECTOMY Bilateral    MECKEL DIVERTICULUM EXCISION  1999   NASAL SINUS SURGERY  05/1997   nuclear stress test  06/2007   negative   precancerous mole removed     RIGHT AND LEFT HEART CATH  with 1 stent placed in March 2022   SIMPLE MASTECTOMY WITH AXILLARY SENTINEL NODE BIOPSY Bilateral 05/23/2014   Procedure: Bilateral simple mastectomy, left sentinel node biopsy ;  Surgeon: Louanne KANDICE Muse, MD;  Location: [facility];  Service: General;  Laterality: Bilateral;   Sleep study  11/2007   no apnea, but did snore (done by [facility])   TONSILLECTOMY  1984   TUBAL LIGATION     WISDOM TOOTH EXTRACTION  1980's    OB History     Gravida  2   Para  2   Term      Preterm      AB      Living         SAB      IAB      Ectopic      Multiple      Live Births           Obstetric Comments  1st Menstrual Cycle:  11  1st Pregnancy:  33          Home Medications    Prior to Admission medications   Medication Sig Start Date End Date Taking? Authorizing Provider  ciprofloxacin  (CIPRO ) 500 MG tablet Take 1 tablet (500 mg total) by mouth 2 (two) times daily for 5 days. 08/01/23 08/06/23 Yes Tula Schryver R, NP  acetaminophen  (TYLENOL ) 650 MG CR tablet Take 1,300 mg by mouth 2 (two) times daily.    [provider]  albuterol  (VENTOLIN  HFA) 108 (90 Base) MCG/ACT inhaler INHALE 1 PUFF INTO  THE LUNGS EVERY 4 HOURS AS NEEDED FOR WHEEZING 05/05/23   Tower, Laine LABOR, MD  amantadine  (SYMMETREL ) 100 MG capsule Take 100 mg by mouth 2 (two) times daily.    [provider]  Ascorbic Acid  (VITAMIN Fry  PO) Take 1,000 mg by mouth daily.    [provider]  aspirin  81 MG chewable tablet Chew 1 tablet (81 mg total) by mouth daily. 06/04/21   Furth, Cadence H, PA-Fry  atorvastatin  (LIPITOR ) 40 MG tablet TAKE 1 TABLET BY MOUTH ONCE DAILY 12/02/22   Gollan, Timothy J, MD  B Complex Vitamins (VITAMIN B COMPLEX PO) Take 1 tablet by mouth every other day.    [provider]  BAYER MICROLET LANCETS lancets USE AS DIRECTED TO CHECK BLOOD SUGAR TWICE DAILY 05/20/16   Von Pacific, MD  Blood Glucose Monitoring Suppl (CONTOUR NEXT MONITOR) w/Device KIT 1 each by Does not apply route in the morning and at bedtime. Use Contour Next meter to check blood sugar twice daily. 04/04/21   Von [place], MD  Calcium  Carb-Cholecalciferol  (CALTRATE 600+D3 PO) Take 1 tablet by mouth daily. 300 mg Vitamin D3    [provider]  cephALEXin  (KEFLEX ) 500 MG capsule Take 1 capsule (500 mg total) by mouth 2 (two) times daily for 7 days. 07/29/23 08/05/23  Couture, Lindsay S, PA-Fry  cyclobenzaprine  (FLEXERIL ) 5 MG tablet TAKE 1 TABLET BY MOUTH EVERY 8 HOURS AS NEEDED FOR MUSCLE SPASMS (BACK PAIN) 10/03/22   Tower, Laine LABOR, MD  dextroamphetamine  (DEXEDRINE  SPANSULE) 15 MG 24 hr capsule Take 5 mg by mouth daily as needed. 08/21/20   [provider]  diazepam  (VALIUM ) 10 MG tablet Take 2.5-7.5 mg by mouth daily as needed for anxiety.    [provider]  docusate sodium  (COLACE) 100 MG capsule Take 100-300 mg by mouth daily as needed for mild constipation or moderate constipation.    [provider]  empagliflozin  (JARDIANCE ) 25  MG TABS tablet TAKE 1 TABLET BY MOUTH ONCE DAILY 07/10/23   Thapa, [place], MD  Evolocumab  (REPATHA  SURECLICK) 140 MG/ML SOAJ INJECT 140MG  SUBCUTANEOUSLY EVERY 2 WEEKS  05/09/23   Gollan, Timothy J, MD  famotidine  (PEPCID ) 40 MG tablet Take 40 mg by mouth 2 (two) times daily. 30 min. Before meal and at bedtime    [provider]  fluticasone  (FLONASE ) 50 MCG/ACT nasal spray Place 1 spray into both nostrils daily. 12/05/20   Dugal, [name], FNP  gabapentin  (NEURONTIN ) 300 MG capsule TAKE 1 CAPSULE BY MOUTH 3 TIMES DAILY 04/25/23   Tower, Atwood A, MD  glucose blood (CONTOUR NEXT TEST) test strip Use to check blood sugar twice a day. 04/11/23   Thapa, [place], MD  lamoTRIgine  (LAMICTAL ) 200 MG tablet Take 400 mg by mouth daily.    [provider]  latanoprost  (XALATAN ) 0.005 % ophthalmic solution Place 1 drop into both eyes 2 (two) times a week.  10/12/10   [provider]  levothyroxine  (LEVOXYL ) 200 MCG tablet Take 1 tablet (200 mcg total) by mouth daily before breakfast. 07/11/23   Thapa, [place], MD  loperamide  (IMODIUM  A-D) 2 MG tablet Take 2 mg by mouth daily as needed for diarrhea or loose stools.    [provider]  Melatonin 10 MG CAPS Take 10 mg by mouth at bedtime.    [provider]  mesalamine  (LIALDA ) 1.2 g EC tablet Take 2 tablets (2.4 g total) by mouth daily. Office visit for further refills 04/11/23   Abran Norleen SAILOR, MD  metoprolol  tartrate (LOPRESSOR ) 25 MG tablet Take 1 tablet (25 mg total) by mouth 2 (two) times daily as needed (for tachycardia). 09/09/22   Gollan, Timothy J, MD  Multiple Vitamin (MULTIVITAMIN) tablet Take 1 tablet by mouth daily. Woman 50 plus    [provider]  nitroGLYCERIN  (NITROSTAT ) 0.4 MG SL tablet Place 1 tablet (0.4 mg total) under the tongue every 5 (five) minutes as needed for chest pain. 09/03/21   Gollan, Timothy J, MD  ondansetron  (ZOFRAN -ODT) 4 MG disintegrating tablet DISSOLVE 1 TABLET ON THE TONGUE EVERY 6 HOURS AS NEEDED FOR NAUSEA OR VOMIT 10/14/22   Abran Norleen SAILOR, MD  pantoprazole  (PROTONIX ) 40 MG tablet TAKE 1 TABLET BY MOUTH TWICE DAILY 01/03/23   Perry, John N, MD   polyethylene glycol (MIRALAX  / GLYCOLAX ) 17 g packet Take 17 g by mouth 3 (three) times daily as needed for moderate constipation, mild constipation or severe constipation. In water  or beverage    [provider]  Probiotic Product (CULTURELLE PROBIOTICS PO) Take 1 capsule by mouth daily. For woman    [provider]  QUEtiapine  (SEROQUEL  XR) 400 MG 24 hr tablet Take 800 mg by mouth at bedtime.    [provider]  simethicone  (MYLICON) 80 MG chewable tablet Chew 80 mg by mouth 4 (four) times daily.    [provider]  tirzepatide  (MOUNJARO ) 15 MG/0.5ML Pen Inject 15 mg into the skin once a week. 07/10/23   Thapa, [place], MD  trimethoprim -polymyxin b  (POLYTRIM ) ophthalmic solution Place 1 drop into right eye every 4 (four) hours for 7 days 05/13/23   Vincente Shivers, NP  valACYclovir  (VALTREX ) 1000 MG tablet TAKE 1 TABLET BY MOUTH TWICE DAILY FOR 5DAYS AS NEEDED FOR OUTBREAK 05/23/23   Tower, Laine LABOR, MD  VRAYLAR  capsule Take 3 mg by mouth at bedtime. 02/28/20   [provider]  zaleplon (SONATA) 10 MG capsule Take 10-20 mg by mouth at  bedtime.    [provider]    Family History Family History  Problem Relation Age of Onset   Coronary artery disease Father    Colon cancer Father    Alzheimer's disease Father    Dementia Father    Colon polyps Father    Coronary artery disease Mother    Hypertension Mother    Osteoporosis Mother    Crohn's disease Mother        crohns colitis   Breast cancer Other        great aunts   Coronary artery disease Other        Uncle (also AAA)   Diabetes Other        remote family history   Stroke Cousin    Esophageal cancer Neg Hx    Rectal cancer Neg Hx    Stomach cancer Neg Hx     Social History Social History   Tobacco Use   Smoking status: Former    Current packs/day: 0.00    Average packs/day: 0.3 packs/day for 5.0 years (1.3 ttl pk-yrs)    Types: Cigarettes    Start date: 01/15/1988    Quit  date: 01/14/1993    Years since quitting: 30.5   Smokeless tobacco: Never  Vaping Use   Vaping status: Never Used  Substance Use Topics   Alcohol use: No    Alcohol/week: 0.0 standard drinks of alcohol    Comment: Recovered ETOH   Drug use: No     Allergies   Ephedrine, Oxycodone , Aspirin , Erythromycin, Nsaids, Other, and Rosuvastatin   Review of Systems Review of Systems   Physical Exam Triage Vital Signs ED Triage Vitals  Encounter Vitals Group     BP 08/01/23 1407 125/81     Girls Systolic BP Percentile --      Girls Diastolic BP Percentile --      Boys Systolic BP Percentile --      Boys Diastolic BP Percentile --      Pulse Rate 08/01/23 1407 (!) 104     Resp 08/01/23 1407 16     Temp 08/01/23 1414 98.5 F (36.9 Fry)     Temp Source 08/01/23 1414 Oral     SpO2 08/01/23 1407 96 %     Weight 08/01/23 1406 140 lb (63.5 kg)     Height 08/01/23 1406 5' 6 (1.676 m)     Head Circumference --      Peak Flow --      Pain Score 08/01/23 1405 6     Pain Loc --      Pain Education --      Exclude from Growth Chart --    No data found.  Updated Vital Signs BP 113/70 (BP Location: Right Arm)   Pulse (!) 104   Temp 98.5 F (36.9 Fry) (Oral)   Resp 16   Ht 5' 6 (1.676 m)   Wt 140 lb (63.5 kg)   LMP 08/23/2006 (Approximate) Comment: tubal ligation  SpO2 94%   BMI 22.60 kg/m   Visual Acuity Right Eye Distance:   Left Eye Distance:   Bilateral Distance:    Right Eye Near:   Left Eye Near:    Bilateral Near:     Physical Exam Constitutional:      Appearance: Normal appearance.  Eyes:     Extraocular Movements: Extraocular movements intact.  Pulmonary:     Effort: Pulmonary effort is normal.  Abdominal:     Tenderness: There is no  abdominal tenderness. There is no right CVA tenderness, left CVA tenderness or guarding.  Neurological:     Mental Status: She is alert and oriented to person, place, and time.      UC Treatments / Results  Labs (all labs  ordered are listed, but only abnormal results are displayed) Labs Reviewed  POCT URINALYSIS DIP (MANUAL ENTRY) - Abnormal; Notable for the following components:      Result Value   Color, UA red (*)    Clarity, UA turbid (*)    Glucose, UA =500 (*)    Bilirubin, UA small (*)    Ketones, POC UA small (15) (*)    Protein Ur, POC =100 (*)    Urobilinogen, UA 2.0 (*)    Nitrite, UA Positive (*)    Leukocytes, UA Large (3+) (*)    All other components within normal limits  URINE CULTURE    EKG   Radiology No results found.  Procedures Procedures (including critical care time)  Medications Ordered in UC Medications - No data to display  Initial Impression / Assessment and Plan / UC Course  I have reviewed the triage vital signs and the nursing notes.  Pertinent labs & imaging results that were available during my care of the patient were reviewed by me and considered in my medical decision making (see chart for details).  Acute cystitis without hematuria  Urinalysis showing nitrates and leukocytes, sent for culture, would like to move forward with changing medication today, discontinue cephalexin  and prescribe Cipro , recommended additional supportive care and advised follow-up if symptoms continue to persist Final Clinical Impressions(s) / UC Diagnoses   Final diagnoses:  Urinary frequency  Acute cystitis without hematuria     Discharge Instructions      Your urinalysis shows Jakyiah Briones blood cells and nitrates which are indicative of infection, your urine will be sent to the lab to determine exactly which bacteria is present, if any changes need to be made to your medications you will be notified  Begin use of ciprofloxacin  twice daily for 5 days, stop Keflex   You may use over-the-counter Azo to help minimize your symptoms until antibiotic removes bacteria, this medication will turn your urine orange  Increase your fluid intake through use of water   As always practice  good hygiene, wiping front to back and avoidance of scented vaginal products to prevent further irritation  If symptoms continue to persist after use of medication or recur please follow-up with urgent care or your primary doctor as needed    ED Prescriptions     Medication Sig Dispense Auth. Provider   ciprofloxacin  (CIPRO ) 500 MG tablet Take 1 tablet (500 mg total) by mouth 2 (two) times daily for 5 days. 10 tablet Ramesses Crampton R, NP      PDMP not reviewed this encounter.   Lindsay Shelba SAUNDERS, NP 08/01/23 1432

## 2023-08-01 NOTE — Telephone Encounter (Deleted)
 Copied from CRM [id number]. Topic: Clinical - Medical Advice >> Jul 31, 2023  1:56 PM Chiquita SQUIBB wrote: Reason for CRM: Patient is calling in stating she has been taking the medication twice a day and the issue came back yesterday, and it is as bad as it was when having her virtual visit on Tuesday. Patient is asking if she needs to have an urinalysis done, as she has not improved and would also like to know if she should go to urgent care. Patient did deny NT.    ----------------------------------------------------------------------- From previous Reason for Contact - Scheduling: Patient/patient representative is calling to schedule an appointment. Refer to attachments for appointment information. >> Aug 01, 2023  3:54 PM CMA Burnard FALCON wrote: Not a pt at [facility]

## 2023-08-01 NOTE — ED Triage Notes (Signed)
 Patient in office today complaint of possible UTI with urgency,frequency and pain x6d. Has been prescribed ceflex on the 15th of this month    OTC: AZO  Denies: HA, Nausea ,fever and vomting

## 2023-08-01 NOTE — Telephone Encounter (Signed)
 CRM was originally sent to the wrong office.  Pt went to UC today to be seen & PCP is away. So I will not route this message to PCP  Copied from CRM [id number]. Topic: Clinical - Medical Advice >> Jul 31, 2023  1:56 PM Lindsay Fry wrote: Reason for CRM: Patient is calling in stating she has been taking the medication twice a day and the issue came back yesterday, and it is as bad as it was when having her virtual visit on Tuesday. Patient is asking if she needs to have an urinalysis done, as she has not improved and would also like to know if she should go to urgent care. Patient did deny NT.    ----------------------------------------------------------------------- From previous Reason for Contact - Scheduling: Patient/patient representative is calling to schedule an appointment. Refer to attachments for appointment information. >> Aug 01, 2023  3:54 PM CMA Burnard FALCON wrote: Not a pt at [facility]

## 2023-08-02 LAB — URINE CULTURE: Culture: NO GROWTH

## 2023-08-04 ENCOUNTER — Ambulatory Visit (HOSPITAL_COMMUNITY): Payer: Self-pay

## 2023-08-08 ENCOUNTER — Ambulatory Visit
Admission: RE | Admit: 2023-08-08 | Discharge: 2023-08-08 | Disposition: A | Discharge status: Home / Self Care | Attending: Nurse Practitioner | Admitting: Nurse Practitioner

## 2023-08-08 VITALS — BP 141/68 | HR 72 | Temp 98.0°F | Resp 18

## 2023-08-08 DIAGNOSIS — N3001 Acute cystitis with hematuria: Secondary | ICD-10-CM | POA: Insufficient documentation

## 2023-08-08 LAB — POCT URINALYSIS DIP (MANUAL ENTRY)
Bilirubin, UA: NEGATIVE
Blood, UA: NEGATIVE
Glucose, UA: 250 mg/dL — AB
Ketones, POC UA: NEGATIVE mg/dL
Nitrite, UA: POSITIVE — AB
Protein Ur, POC: NEGATIVE mg/dL
Spec Grav, UA: <=1.005 — AB (ref 1.010–1.025)
Urobilinogen, UA: 1 U/dL
pH, UA: 5 (ref 5.0–8.0)

## 2023-08-08 MED ORDER — NITROFURANTOIN MONOHYD MACRO 100 MG PO CAPS: 100.0000 mg | ORAL_CAPSULE | Freq: Two times a day (BID) | ORAL | 0 refills | Status: DC

## 2023-08-08 MED ORDER — URIBEL 118 MG PO CAPS: 1.0000 | ORAL_CAPSULE | Freq: Four times a day (QID) | ORAL | 0 refills | Status: AC

## 2023-08-08 NOTE — Telephone Encounter (Signed)
 Urine culture showed no growth Did not actually have uti per note  Please follow up with first available for symptoms ? Is something else is causing her urinary symptoms

## 2023-08-08 NOTE — ED Triage Notes (Addendum)
 Patient to Urgent Care with complaints of suprapubic pressure/ urinary frequency and urgency. Denies any pain or fevers. Denies any vaginal discharge.  Completed cipro  Wednesday morning. Reports within 24 hrs she started having the same symptoms. Taking pyridium and uristat with cranberry with some relief.

## 2023-08-08 NOTE — Discharge Instructions (Addendum)
 You have been diagnosed with a recurrent urinary tract infection. You were previously treated with cephalexin  and ciprofloxacin  without full relief, and your symptoms returned shortly after completing antibiotics. A new prescription for Macrobid  has been provided for 5 days, along with Uribel to help relieve urinary discomfort. A repeat urine culture has also been sent to better understand the cause of your symptoms.  Take all medications exactly as prescribed and complete the full course of antibiotics, even if symptoms begin to improve. Use Uribel as directed for symptom relief and be aware that it may change the color of your urine to blue or green. Drink plenty of water  and avoid caffeine, which can irritate the bladder.  Follow up with your primary care provider to review your urine culture results. If the culture is negative and symptoms continue or return, your PCP may refer you to a urologist for further evaluation. Go to the emergency room if you develop fever, chills, worsening pain, nausea, vomiting, back or flank pain, or difficulty urinating.

## 2023-08-08 NOTE — ED Provider Notes (Signed)
 Lindsay Fry    CSN: [id number] Arrival date & time: 08/08/23  1722      History   Chief Complaint Chief Complaint  Patient presents with   Urinary Frequency    HPI Lindsay Fry is a 63 y.o. female.   Discussed the use of AI scribe software for clinical note transcription with the patient, who gave verbal consent to proceed.   The patient presents with recurrent urinary symptoms that have persisted for over a week despite two courses of antibiotics. She was initially seen via virtual visit on July 15 for urinary complaints and prescribed Keflex , which did not improve her symptoms. She was evaluated in clinic on July 18 for continued urinary urgency and frequency, and urinalysis at that time revealed large amounts of leukocytes and nitrites. She was advised to stop Keflex  and was switched to Ciprofloxacin . She was also advised to continue AZO for symptom relief and increase fluid intake. A urine culture later returned negative for bacterial growth. The patient completed the course of Ciprofloxacin  on Wednesday morning, but her symptoms returned within 24 hours. She continues to experience urinary urgency and frequency with small volume voids but denies fever, low back pain, or flank discomfort. She reports using Urostat with cranberry, which has provided some mild symptom relief.  The following portions of the patient's history were reviewed and updated as appropriate: allergies, current medications, past family history, past medical history, past social history, past surgical history, and problem list.    Past Medical History:  Diagnosis Date   ADD (attention deficit disorder) 11/19/2012   Allergic rhinitis, cause unspecified    Anxiety state, unspecified    Arthritis    Asymptomatic postmenopausal status (age-related) (natural)    Atypical hyperplasia of left breast 2000   Atypical mole 03/16/2018   R paraspinal mid upper back, excision   Atypical mole 02/17/2018    R spinal upper back, moderate   Atypical mole 08/11/2017   L sternum, excision   Atypical mole 05/27/2017   L post neck, moderate   Benign neoplasm of other and unspecified site of the digestive system    Bipolar disorder (HCC)    Breast cancer (HCC)    Left Breast 6mm invasive CA left uiq, dcis left uoq and a lot ADH, ALH in both breasts.   CAD (coronary artery disease)    a. 08/2014 NSTEMI/Cath: LM nl, LAD 50p, D1/D2 min irregs, LCX nl, OM1 40, LPDA nl, RI 99ost (2.82mm vessel->med Rx), RCA nl, AM 60, nl EF; b. 11/2016 MV: EF 74%, no ischemia (performed 2/2 dyspnea & new inf TWI).   Carcinoma of left breast (HCC) 06/02/2014   Cataract    Chronic diastolic CHF (congestive heart failure) (HCC)    a. 08/2014 Echo: EF 60-65%, Gr 1 DD, nl LA.   Clotting disorder (HCC)    on blood thiner, Plavix    Cyst    on Achilles tendon   Depression    Diabetes mellitus (HCC)    frank   Dysuria    Edema    Family history of malignant neoplasm of gastrointestinal tract    Fibromyalgia    GERD (gastroesophageal reflux disease)    Glaucoma    pre glaucoma, 05/14/2018 pt. denies glaucoma   Headache    migraines   Hiatal hernia    History of alcoholism (HCC)    History of migraines    History of ovarian cyst    Hx of adenomatous colonic polyps    Insomnia,  unspecified    Myocardial infarction (HCC) 09-04-14   OCD (obsessive compulsive disorder)    Osteopenia 09/25/2016   Femoral neck T -1.1  9/18   Other screening mammogram    Pure hypercholesterolemia    Rosacea    Subacute confusional state 11/19/2012   Tubular adenoma of colon 01/10/11   Ulcerative colitis, unspecified    Unspecified asthma(493.90)    Unspecified essential hypertension    Unspecified hypothyroidism    Unspecified vitamin D  deficiency    Vertigo     Patient Active Problem List   Diagnosis Date Noted   Bacterial conjunctivitis of right eye 05/13/2023   Encounter for gynecological examination 05/12/2023   Generalized  body aches 04/18/2023   Shoulder pain, right 08/07/2022   Ulcerative (chronic) pancolitis without complications (HCC) 04/29/2022   Stress reaction 10/29/2021   Bipolar 2 disorder (HCC) 04/17/2021   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   Elevated transaminase level 03/27/2020   Current use of proton pump inhibitor 03/19/2020   Hypokalemia 03/07/2020   Aortic atherosclerosis (HCC) 03/07/2020   MCI (mild cognitive impairment) 08/05/2019   Poor balance 12/16/2018   Fatigue 12/16/2018   Migraine 11/12/2018   Cervical radiculopathy due to degenerative joint disease of spine 10/28/2017   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   Osteopenia 09/25/2016   Estrogen deficiency 05/30/2016   Chronic constipation 01/18/2015   Acute bronchitis 11/15/2014   NSTEMI (non-ST elevated myocardial infarction) (HCC) 09/06/2014   Atherosclerosis of native coronary artery of native heart with angina pectoris (HCC) 09/06/2014   DM (diabetes mellitus) (HCC) 09/05/2014   History of breast cancer 06/02/2014   ADD (attention deficit disorder) 11/19/2012   Low back pain 08/05/2012   Hypothyroid 05/18/2012   Chronic sinusitis 01/24/2012   Asymptomatic postmenopausal status 06/22/2008   Vitamin D  deficiency 03/18/2008   BENIGN NEOPLASM OTH&UNSPEC SITE DIGESTIVE SYSTEM 01/09/2007   Diaphragmatic hernia 01/09/2007   History of colonic polyps 01/09/2007   Hyperlipidemia associated with type 2 diabetes mellitus (HCC) 01/07/2007   Essential hypertension 01/07/2007   Allergic rhinitis 01/07/2007   Asthma 01/07/2007   GERD 01/07/2007   ACNE ROSACEA 01/07/2007   MIGRAINES, HX OF 01/07/2007   Fibromyalgia 10/30/2006   INSOMNIA 10/30/2006    Past Surgical History:  Procedure Laterality Date   APPENDECTOMY     AXILLARY SENTINEL NODE BIOPSY Left 05/23/2014   Procedure: AXILLARY SENTINEL NODE BIOPSY;  Surgeon: Louanne KANDICE Muse, MD;  Location: [facility];  Service: General;  Laterality: Left;    BREAST BIOPSY  08/1998   on tamoxifen, atypical hyperplasia   BREAST BIOPSY Left 04/2014   BREAST SURGERY Left 08/1998   lumpectomy/ Dr Rollene   BREAST SURGERY Bilateral 05/23/2014   Mastectomy   BUNIONECTOMY Right    CARDIAC CATHETERIZATION N/A 09/05/2014   Procedure: Left Heart Cath and Coronary Angiography;  Surgeon: Deatrice DELENA Cage, MD;  Location: [facility];  Service: Cardiovascular;  Laterality: N/A;   CARDIAC CATHETERIZATION  09/05/2014   CATARACT EXTRACTION W/ INTRAOCULAR LENS IMPLANT Bilateral    CESAREAN SECTION  1996/1997   placenta previa, gest DM, pre-eclampsia   CHOLECYSTECTOMY  1997   adhesions also   COLONOSCOPY  01/2001   Ulcerative colitis   COLONOSCOPY  04/2004   UC, polyp   COLONOSCOPY  12/2006   UC, no polyps   CORONARY STENT INTERVENTION N/A 04/06/2020   Procedure: CORONARY STENT INTERVENTION;  Surgeon: Mady Bruckner, MD;  Location: [facility];  Service: Cardiovascular;  Laterality: N/A;  DEXA  04/1999 and 2010   normal   ESOPHAGOGASTRODUODENOSCOPY  09/2001   polyp   exercise stress test  11/2003   negative   FEMUR FRACTURE SURGERY Left    LEFT HEART CATH AND CORONARY ANGIOGRAPHY N/A 04/06/2020   Procedure: LEFT HEART CATH AND CORONARY ANGIOGRAPHY;  Surgeon: Perla Evalene PARAS, MD;  Location: [facility];  Service: Cardiovascular;  Laterality: N/A;   MASTECTOMY Bilateral    MECKEL DIVERTICULUM EXCISION  1999   NASAL SINUS SURGERY  05/1997   nuclear stress test  06/2007   negative   precancerous mole removed     RIGHT AND LEFT HEART CATH     with 1 stent placed in March 2022   SIMPLE MASTECTOMY WITH AXILLARY SENTINEL NODE BIOPSY Bilateral 05/23/2014   Procedure: Bilateral simple mastectomy, left sentinel node biopsy ;  Surgeon: Louanne KANDICE Muse, MD;  Location: [facility];  Service: General;  Laterality: Bilateral;   Sleep study  11/2007   no apnea, but did snore (done by [facility])   TONSILLECTOMY  1984   TUBAL  LIGATION     WISDOM TOOTH EXTRACTION  1980's    OB History     Gravida  2   Para  2   Term      Preterm      AB      Living         SAB      IAB      Ectopic      Multiple      Live Births           Obstetric Comments  1st Menstrual Cycle:  11  1st Pregnancy:  33          Home Medications    Prior to Admission medications   Medication Sig Start Date End Date Taking? Authorizing Provider  Meth-Hyo-M Bl-Na Phos-Ph Sal (URIBEL) 118 MG CAPS Take 1 capsule (118 mg total) by mouth in the morning, at noon, in the evening, and at bedtime for 5 days. 08/08/23 08/13/23 Yes Iola Lukes, FNP  nitrofurantoin , macrocrystal-monohydrate, (MACROBID ) 100 MG capsule Take 1 capsule (100 mg total) by mouth 2 (two) times daily. 08/08/23  Yes Iola Lukes, FNP  acetaminophen  (TYLENOL ) 650 MG CR tablet Take 1,300 mg by mouth 2 (two) times daily.    [provider]  albuterol  (VENTOLIN  HFA) 108 (90 Base) MCG/ACT inhaler INHALE 1 PUFF INTO THE LUNGS EVERY 4 HOURS AS NEEDED FOR WHEEZING 05/05/23   Tower, Laine LABOR, MD  amantadine  (SYMMETREL ) 100 MG capsule Take 100 mg by mouth 2 (two) times daily.    [provider]  Ascorbic Acid  (VITAMIN C  PO) Take 1,000 mg by mouth daily.    [provider]  aspirin  81 MG chewable tablet Chew 1 tablet (81 mg total) by mouth daily. 06/04/21   Furth, Cadence H, PA-C  atorvastatin  (LIPITOR ) 40 MG tablet TAKE 1 TABLET BY MOUTH ONCE DAILY 12/02/22   Gollan, Timothy J, MD  B Complex Vitamins (VITAMIN B COMPLEX PO) Take 1 tablet by mouth every other day.    [provider]  BAYER MICROLET LANCETS lancets USE AS DIRECTED TO CHECK BLOOD SUGAR TWICE DAILY 05/20/16   [place], MD  Blood Glucose Monitoring Suppl (CONTOUR NEXT MONITOR) w/Device KIT 1 each by Does not apply route in the morning and at bedtime. Use Contour Next meter to check blood sugar twice daily. 04/04/21   [place], MD  Calcium  Carb-Cholecalciferol   (  CALTRATE 600+D3 PO) Take 1 tablet by mouth daily. 300 mg Vitamin D3    [provider]  cyclobenzaprine  (FLEXERIL ) 5 MG tablet TAKE 1 TABLET BY MOUTH EVERY 8 HOURS AS NEEDED FOR MUSCLE SPASMS (BACK PAIN) 10/03/22   Tower, Laine LABOR, MD  dextroamphetamine  (DEXEDRINE  SPANSULE) 15 MG 24 hr capsule Take 5 mg by mouth daily as needed. 08/21/20   [provider]  diazepam  (VALIUM ) 10 MG tablet Take 2.5-7.5 mg by mouth daily as needed for anxiety.    [provider]  docusate sodium  (COLACE) 100 MG capsule Take 100-300 mg by mouth daily as needed for mild constipation or moderate constipation.    [provider]  empagliflozin  (JARDIANCE ) 25 MG TABS tablet TAKE 1 TABLET BY MOUTH ONCE DAILY 07/10/23   Thapa, [place], MD  Evolocumab  (REPATHA  SURECLICK) 140 MG/ML SOAJ INJECT 140MG  SUBCUTANEOUSLY EVERY 2 WEEKS 05/09/23   Gollan, Timothy J, MD  famotidine  (PEPCID ) 40 MG tablet Take 40 mg by mouth 2 (two) times daily. 30 min. Before meal and at bedtime    [provider]  fluticasone  (FLONASE ) 50 MCG/ACT nasal spray Place 1 spray into both nostrils daily. 12/05/20   Dugal, [name], FNP  gabapentin  (NEURONTIN ) 300 MG capsule TAKE 1 CAPSULE BY MOUTH 3 TIMES DAILY 04/25/23   Tower, [place] A, MD  glucose blood (CONTOUR NEXT TEST) test strip Use to check blood sugar twice a day. 04/11/23   Thapa, [place], MD  lamoTRIgine  (LAMICTAL ) 200 MG tablet Take 400 mg by mouth daily.    [provider]  latanoprost  (XALATAN ) 0.005 % ophthalmic solution Place 1 drop into both eyes 2 (two) times a week.  10/12/10   [provider]  levothyroxine  (LEVOXYL ) 200 MCG tablet Take 1 tablet (200 mcg total) by mouth daily before breakfast. 07/11/23   Thapa, [place], MD  loperamide  (IMODIUM  A-D) 2 MG tablet Take 2 mg by mouth daily as needed for diarrhea or loose stools.    [provider]  Melatonin 10 MG CAPS Take 10 mg by mouth at bedtime.    [provider]  mesalamine   (LIALDA ) 1.2 g EC tablet Take 2 tablets (2.4 g total) by mouth daily. Office visit for further refills 04/11/23   Abran Norleen SAILOR, MD  metoprolol  tartrate (LOPRESSOR ) 25 MG tablet Take 1 tablet (25 mg total) by mouth 2 (two) times daily as needed (for tachycardia). 09/09/22   Gollan, Timothy J, MD  Multiple Vitamin (MULTIVITAMIN) tablet Take 1 tablet by mouth daily. Woman 50 plus    [provider]  nitroGLYCERIN  (NITROSTAT ) 0.4 MG SL tablet Place 1 tablet (0.4 mg total) under the tongue every 5 (five) minutes as needed for chest pain. 09/03/21   Gollan, Timothy J, MD  ondansetron  (ZOFRAN -ODT) 4 MG disintegrating tablet DISSOLVE 1 TABLET ON THE TONGUE EVERY 6 HOURS AS NEEDED FOR NAUSEA OR VOMIT 10/14/22   Abran Norleen SAILOR, MD  pantoprazole  (PROTONIX ) 40 MG tablet TAKE 1 TABLET BY MOUTH TWICE DAILY 01/03/23   Perry, John N, MD  polyethylene glycol (MIRALAX  / GLYCOLAX ) 17 g packet Take 17 g by mouth 3 (three) times daily as needed for moderate constipation, mild constipation or severe constipation. In water  or beverage    [provider]  Probiotic Product (CULTURELLE PROBIOTICS PO) Take 1 capsule by mouth daily. For woman    [provider]  QUEtiapine  (SEROQUEL  XR) 400 MG 24 hr tablet Take 800 mg by mouth at bedtime.    [provider]  simethicone  (MYLICON) 80 MG chewable tablet Chew 80 mg by mouth 4 (four) times daily.    [provider]  tirzepatide  (MOUNJARO ) 15 MG/0.5ML Pen Inject 15 mg into the skin once a week. 07/10/23   Thapa, [place], MD  trimethoprim -polymyxin b  (POLYTRIM ) ophthalmic solution Place 1 drop into right eye every 4 (four) hours for 7 days 05/13/23   Vincente Shivers, NP  valACYclovir  (VALTREX ) 1000 MG tablet TAKE 1 TABLET BY MOUTH TWICE DAILY FOR 5DAYS AS NEEDED FOR OUTBREAK 05/23/23   Tower, Laine LABOR, MD  VRAYLAR  capsule Take 3 mg by mouth at bedtime. 02/28/20   [provider]  zaleplon (SONATA) 10 MG capsule Take 10-20 mg by mouth at bedtime.     [provider]    Family History Family History  Problem Relation Age of Onset   Coronary artery disease Father    Colon cancer Father    Alzheimer's disease Father    Dementia Father    Colon polyps Father    Coronary artery disease Mother    Hypertension Mother    Osteoporosis Mother    Crohn's disease Mother        crohns colitis   Breast cancer Other        great aunts   Coronary artery disease Other        Uncle (also AAA)   Diabetes Other        remote family history   Stroke Cousin    Esophageal cancer Neg Hx    Rectal cancer Neg Hx    Stomach cancer Neg Hx     Social History Social History   Tobacco Use   Smoking status: Former    Current packs/day: 0.00    Average packs/day: 0.3 packs/day for 5.0 years (1.3 ttl pk-yrs)    Types: Cigarettes    Start date: 01/15/1988    Quit date: 01/14/1993    Years since quitting: 30.5   Smokeless tobacco: Never  Vaping Use   Vaping status: Never Used  Substance Use Topics   Alcohol use: No    Alcohol/week: 0.0 standard drinks of alcohol    Comment: Recovered ETOH   Drug use: No     Allergies   Ephedrine, Oxycodone , Aspirin , Erythromycin, Nsaids, Other, and Rosuvastatin   Review of Systems Review of Systems  Constitutional:  Negative for fever.  Gastrointestinal:  Negative for abdominal pain, nausea and vomiting.  Genitourinary:  Positive for dysuria, frequency and urgency. Negative for flank pain and vaginal discharge.  Musculoskeletal:  Negative for back pain.  All other systems reviewed and are negative.    Physical Exam Triage Vital Signs ED Triage Vitals  Encounter Vitals Group     BP 08/08/23 1740 (!) 141/68     Girls Systolic BP Percentile --      Girls Diastolic BP Percentile --      Boys Systolic BP Percentile --      Boys Diastolic BP Percentile --      Pulse Rate 08/08/23 1740 72     Resp 08/08/23 1740 18     Temp 08/08/23 1740 98 F (36.7 C)     Temp src --      SpO2 08/08/23  1740 95 %     Weight --      Height --      Head Circumference --      Peak Flow --      Pain Score 08/08/23 1741 3     Pain Loc --  Pain Education --      Exclude from Growth Chart --    No data found.  Updated Vital Signs BP (!) 141/68   Pulse 72   Temp 98 F (36.7 C)   Resp 18   LMP 08/23/2006 (Approximate) Comment: tubal ligation  SpO2 95%   Visual Acuity Right Eye Distance:   Left Eye Distance:   Bilateral Distance:    Right Eye Near:   Left Eye Near:    Bilateral Near:     Physical Exam Vitals reviewed.  Constitutional:      General: She is awake. She is not in acute distress.    Appearance: Normal appearance. She is well-developed. She is not ill-appearing, toxic-appearing or diaphoretic.  HENT:     Head: Normocephalic.     Right Ear: Hearing normal.     Left Ear: Hearing normal.     Nose: Nose normal.     Mouth/Throat:     Mouth: Mucous membranes are moist.  Eyes:     General: Vision grossly intact.     Conjunctiva/sclera: Conjunctivae normal.  Cardiovascular:     Rate and Rhythm: Normal rate and regular rhythm.     Heart sounds: Normal heart sounds.  Pulmonary:     Effort: Pulmonary effort is normal.     Breath sounds: Normal breath sounds and air entry.  Musculoskeletal:        General: Normal range of motion.     Cervical back: Full passive range of motion without pain, normal range of motion and neck supple.  Skin:    General: Skin is warm and dry.  Neurological:     General: No focal deficit present.     Mental Status: She is alert and oriented to person, place, and time.  Psychiatric:        Speech: Speech normal.        Behavior: Behavior is cooperative.      UC Treatments / Results  Labs (all labs ordered are listed, but only abnormal results are displayed) Labs Reviewed  POCT URINALYSIS DIP (MANUAL ENTRY) - Abnormal; Notable for the following components:      Result Value   Color, UA orange (*)    Glucose, UA =250 (*)     Spec Grav, UA <=1.005 (*)    Nitrite, UA Positive (*)    Leukocytes, UA Trace (*)    All other components within normal limits  URINE CULTURE    EKG   Radiology No results found.  Procedures Procedures (including critical care time)  Medications Ordered in UC Medications - No data to display  Initial Impression / Assessment and Plan / UC Course  I have reviewed the triage vital signs and the nursing notes.  Pertinent labs & imaging results that were available during my care of the patient were reviewed by me and considered in my medical decision making (see chart for details).     Patient presents with recurrent urinary tract symptoms beginning July 29, 2023. Initial treatment following a virtual visit with cephalexin  was ineffective. In-person evaluation on August 01, 2023, revealed a urinalysis with large leukocytes, nitrites, and blood, consistent with UTI. Ciprofloxacin  was prescribed and completed on July 23, but symptoms returned within 24 hours. A previous urine culture showed no bacterial growth, raising concern for a non-bacterial etiology. Current urinalysis continues to show leukocytes and nitrites. The patient denies fever, flank pain, or low back pain. She reports partial symptom relief with over-the-counter Urostat containing cranberry. Given  the recurrent symptoms and incomplete response to prior antibiotics, Macrobid  was prescribed for 5 days, and Uribel was added for urinary discomfort. A repeat urine culture was ordered. Patient was advised to increase fluid intake, avoid caffeine, and follow up with her PCP for possible urology referral if culture remains negative and symptoms persist or recur. ED evaluation is advised for worsening symptoms, fever, flank pain, or signs of systemic illness.  Today's evaluation has revealed no signs of a dangerous process. Discussed diagnosis with patient and/or guardian. Patient and/or guardian aware of their diagnosis, possible red flag  symptoms to watch out for and need for close follow up. Patient and/or guardian understands verbal and written discharge instructions. Patient and/or guardian comfortable with plan and disposition.  Patient and/or guardian has a clear mental status at this time, good insight into illness (after discussion and teaching) and has clear judgment to make decisions regarding their care  Documentation was completed with the aid of voice recognition software. Transcription may contain typographical errors. Final Clinical Impressions(s) / UC Diagnoses   Final diagnoses:  Acute cystitis with hematuria     Discharge Instructions      You have been diagnosed with a recurrent urinary tract infection. You were previously treated with cephalexin  and ciprofloxacin  without full relief, and your symptoms returned shortly after completing antibiotics. A new prescription for Macrobid  has been provided for 5 days, along with Uribel to help relieve urinary discomfort. A repeat urine culture has also been sent to better understand the cause of your symptoms.  Take all medications exactly as prescribed and complete the full course of antibiotics, even if symptoms begin to improve. Use Uribel as directed for symptom relief and be aware that it may change the color of your urine to blue or green. Drink plenty of water  and avoid caffeine, which can irritate the bladder.  Follow up with your primary care provider to review your urine culture results. If the culture is negative and symptoms continue or return, your PCP may refer you to a urologist for further evaluation. Go to the emergency room if you develop fever, chills, worsening pain, nausea, vomiting, back or flank pain, or difficulty urinating.      ED Prescriptions     Medication Sig Dispense Auth. Provider   nitrofurantoin , macrocrystal-monohydrate, (MACROBID ) 100 MG capsule Take 1 capsule (100 mg total) by mouth 2 (two) times daily. 10 capsule Iola Lukes, FNP   Meth-Hyo-M Starlene Phos-Ph Sal (URIBEL) 118 MG CAPS Take 1 capsule (118 mg total) by mouth in the morning, at noon, in the evening, and at bedtime for 5 days. 20 capsule Iola Lukes, FNP      PDMP not reviewed this encounter.   Iola Lukes, [place] 08/08/23 [phone]

## 2023-08-09 LAB — URINE CULTURE: Culture: NO GROWTH

## 2023-08-12 ENCOUNTER — Ambulatory Visit (HOSPITAL_COMMUNITY): Payer: Self-pay

## 2023-08-14 ENCOUNTER — Telehealth: Payer: Self-pay | Admitting: *Deleted

## 2023-08-14 NOTE — Telephone Encounter (Signed)
 Pt sent a message saying:  Hi Dr Randeen,   [facility] all is well in your world!   Taking the last nitrofurantoin  now (7:50 pm). Pressure & relative pain with frequent & urgent sensations. I finally got the URO-MP Rx filled.    The pain & pressure continued throughout HS. What should I do if it gets worse between now & Monday 8/4 appt?

## 2023-08-14 NOTE — Telephone Encounter (Signed)
 Sent mychart letting pt know  ?

## 2023-08-14 NOTE — Telephone Encounter (Signed)
 It looks like your last culture was negative again so pain is not coming from a uti based on that  Try the urinary analgesic Also drink water  to keep urine dilute  Avoid any other beverages including coffee/tea or artificial sweeteners like aspartame - these can all cause urinary pain   If severe symptoms or fever/nausea/vomiting or flank pain after hours go to the ER

## 2023-08-16 ENCOUNTER — Other Ambulatory Visit: Payer: Self-pay | Admitting: Family Medicine

## 2023-08-16 ENCOUNTER — Other Ambulatory Visit: Payer: Self-pay | Admitting: Endocrinology

## 2023-08-16 ENCOUNTER — Other Ambulatory Visit: Payer: Self-pay | Admitting: Internal Medicine

## 2023-08-16 DIAGNOSIS — E669 Obesity, unspecified: Secondary | ICD-10-CM

## 2023-08-18 ENCOUNTER — Ambulatory Visit (INDEPENDENT_AMBULATORY_CARE_PROVIDER_SITE_OTHER): Admitting: Family Medicine

## 2023-08-18 ENCOUNTER — Other Ambulatory Visit: Payer: Self-pay | Admitting: Endocrinology

## 2023-08-18 ENCOUNTER — Encounter: Payer: Self-pay | Admitting: Family Medicine

## 2023-08-18 ENCOUNTER — Other Ambulatory Visit: Payer: Self-pay | Admitting: Internal Medicine

## 2023-08-18 VITALS — BP 108/76 | HR 75 | Temp 97.6°F | Ht 66.0 in | Wt 144.0 lb

## 2023-08-18 DIAGNOSIS — R110 Nausea: Secondary | ICD-10-CM

## 2023-08-18 DIAGNOSIS — R300 Dysuria: Secondary | ICD-10-CM | POA: Diagnosis not present

## 2023-08-18 DIAGNOSIS — E1169 Type 2 diabetes mellitus with other specified complication: Secondary | ICD-10-CM

## 2023-08-18 LAB — POCT UA - MICROSCOPIC ONLY

## 2023-08-18 LAB — POC URINALSYSI DIPSTICK (AUTOMATED)
Bilirubin, UA: NEGATIVE
Blood, UA: NEGATIVE
Glucose, UA: POSITIVE — AB
Ketones, UA: NEGATIVE
Nitrite, UA: NEGATIVE
Protein, UA: NEGATIVE
Spec Grav, UA: 1.01 (ref 1.010–1.025)
Urobilinogen, UA: 0.2 U/dL
pH, UA: 6 (ref 5.0–8.0)

## 2023-08-18 MED ORDER — URO-MP 118 MG PO CAPS: ORAL_CAPSULE | ORAL | 0 refills | Status: AC

## 2023-08-18 NOTE — Telephone Encounter (Signed)
 Refill request complete

## 2023-08-18 NOTE — Progress Notes (Signed)
 Subjective:    Patient ID: Lindsay Fry, female    DOB: Jun 30, 1960, 63 y.o.   MRN: [id number]  HPI  Wt Readings from Last 3 Encounters:  08/18/23 144 lb (65.3 kg)  08/01/23 140 lb (63.5 kg)  07/10/23 144 lb (65.3 kg)   23.24 kg/m  Vitals:   08/18/23 1107  BP: 108/76  Pulse: 75  Temp: 97.6 F (36.4 C)  SpO2: 98%   Pt presents with c/o dysuria    Urinary symptoms on/off since July 15 Treatment with cephalexin  after virtual visit  Urinalysis 7/18 noted positive for leuk, nit,blood and cipro  was prescription  Symptoms resolved and returned 24 h after finishing antibiotic  Using urostat with cranberry for symptoms   Was treatment with macrobid  for 5 more days at [facility]  Uribel  was added for discomfort  Most recent culture-negative for infection from 08/08/23  no growth   Today  Urinalysis notes tr leuk and some glucose   Did drink coffee this am  Tries to avoid spicy food   Took pyridium for a while  Uribrel is helping - but very expensive   Burning to urinate  Frequency and urgency and pressure   Results for orders placed or performed in visit on 08/18/23  POCT Urinalysis Dipstick (Automated)   Collection Time: 08/18/23 11:24 AM  Result Value Ref Range   Color, UA Light Green    Clarity, UA Clear    Glucose, UA Positive (A) Negative   Bilirubin, UA Negative    Ketones, UA Negative    Spec Grav, UA 1.010 1.010 - 1.025   Blood, UA Negative    pH, UA 6.0 5.0 - 8.0   Protein, UA Negative Negative   Urobilinogen, UA 0.2 0.2 or 1.0 E.U./dL   Nitrite, UA Negative    Leukocytes, UA Trace (A) Negative  POCT UA - Microscopic Only   Collection Time: 08/18/23  1:56 PM  Result Value Ref Range   WBC, Ur, HPF, POC none 0 - 5   RBC, Urine, Miroscopic none 0 - 2   Bacteria, U Microscopic none None - Trace   Mucus, UA few    Epithelial cells, urine per micros few    Crystals, Ur, HPF, POC none    Casts, Ur, LPF, POC none    Yeast, UA none    *Note: Due to a  large number of results and/or encounters for the requested time period, some results have not been displayed. A complete set of results can be found in Results Review.     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   Elevated transaminase level 03/27/2020   Current use of proton pump inhibitor 03/19/2020   Hypokalemia 03/07/2020   Aortic atherosclerosis (HCC) 03/07/2020   MCI (mild cognitive impairment) 08/05/2019   Poor balance 12/16/2018   Fatigue 12/16/2018   Migraine 11/12/2018   Cervical radiculopathy due to degenerative joint disease of spine 10/28/2017   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   Osteopenia 09/25/2016   Estrogen deficiency 05/30/2016   Dysuria 03/19/2016   Chronic constipation 01/18/2015   NSTEMI (non-ST elevated myocardial infarction) (HCC)  09/06/2014   Atherosclerosis of native coronary artery of native heart with angina pectoris (HCC) 09/06/2014   DM (diabetes mellitus) (HCC) 09/05/2014   History of breast cancer 06/02/2014   ADD (attention deficit disorder) 11/19/2012   Low back pain 08/05/2012   Hypothyroid 05/18/2012   Chronic sinusitis 01/24/2012   Asymptomatic postmenopausal status 06/22/2008   Vitamin D  deficiency 03/18/2008   BENIGN NEOPLASM OTH&UNSPEC SITE DIGESTIVE SYSTEM 01/09/2007   Diaphragmatic hernia 01/09/2007   History of colonic polyps 01/09/2007   Hyperlipidemia associated with type 2 diabetes mellitus (HCC) 01/07/2007   Essential hypertension 01/07/2007   Allergic rhinitis 01/07/2007   Asthma 01/07/2007   GERD 01/07/2007   ACNE  ROSACEA 01/07/2007   MIGRAINES, HX OF 01/07/2007   Fibromyalgia 10/30/2006   INSOMNIA 10/30/2006   Past Medical History:  Diagnosis Date   ADD (attention deficit disorder) 11/19/2012   Allergic rhinitis, cause unspecified    Anxiety state, unspecified    Arthritis    Asymptomatic postmenopausal status (age-related) (natural)    Atypical hyperplasia of left breast 2000   Atypical mole 03/16/2018   R paraspinal mid upper back, excision   Atypical mole 02/17/2018   R spinal upper back, moderate   Atypical mole 08/11/2017   L sternum, excision   Atypical mole 05/27/2017   L post neck, moderate   Benign neoplasm of other and unspecified site of the digestive system    Bipolar disorder (HCC)    Breast cancer (HCC)    Left Breast 6mm invasive CA left uiq, dcis left uoq and a lot ADH, ALH in both breasts.   CAD (coronary artery disease)    a. 08/2014 NSTEMI/Cath: LM nl, LAD 50p, D1/D2 min irregs, LCX nl, OM1 40, LPDA nl, RI 99ost (2.27mm vessel->med Rx), RCA nl, AM 60, nl EF; b. 11/2016 MV: EF 74%, no ischemia (performed 2/2 dyspnea & new inf TWI).   Carcinoma of left breast (HCC) 06/02/2014   Cataract    Chronic diastolic CHF (congestive heart failure) (HCC)    a. 08/2014 Echo: EF 60-65%, Gr 1 DD, nl LA.   Clotting disorder (HCC)    on blood thiner, Plavix    Cyst    on Achilles tendon   Depression    Diabetes mellitus (HCC)    frank   Dysuria    Edema    Family history of malignant neoplasm of gastrointestinal tract    Fibromyalgia    GERD (gastroesophageal reflux disease)    Glaucoma    pre glaucoma, 05/14/2018 pt. denies glaucoma   Headache    migraines   Hiatal hernia    History of alcoholism (HCC)    History of migraines    History of ovarian cyst    Hx of adenomatous colonic polyps    Insomnia, unspecified    Myocardial infarction (HCC) 09-04-14   OCD (obsessive compulsive disorder)    Osteopenia 09/25/2016   Femoral neck T -1.1  9/18   Other screening mammogram     Pure hypercholesterolemia    Rosacea    Subacute confusional state 11/19/2012   Tubular adenoma of colon 01/10/11   Ulcerative colitis, unspecified    Unspecified asthma(493.90)    Unspecified essential hypertension    Unspecified hypothyroidism    Unspecified vitamin D  deficiency    Vertigo    Past Surgical History:  Procedure Laterality Date   APPENDECTOMY     AXILLARY SENTINEL NODE BIOPSY Left 05/23/2014   Procedure: AXILLARY SENTINEL NODE BIOPSY;  Surgeon: Seeplaputhur G Sankar,  MD;  Location: [facility] ORS;  Service: General;  Laterality: Left;   BREAST BIOPSY  08/1998   on tamoxifen, atypical hyperplasia   BREAST BIOPSY Left 04/2014   BREAST SURGERY Left 08/1998   lumpectomy/ Dr Rollene   BREAST SURGERY Bilateral 05/23/2014   Mastectomy   BUNIONECTOMY Right    CARDIAC CATHETERIZATION N/A 09/05/2014   Procedure: Left Heart Cath and Coronary Angiography;  Surgeon: Deatrice DELENA Cage, MD;  Location: [facility];  Service: Cardiovascular;  Laterality: N/A;   CARDIAC CATHETERIZATION  09/05/2014   CATARACT EXTRACTION W/ INTRAOCULAR LENS IMPLANT Bilateral    CESAREAN SECTION  1996/1997   placenta previa, gest DM, pre-eclampsia   CHOLECYSTECTOMY  1997   adhesions also   COLONOSCOPY  01/2001   Ulcerative colitis   COLONOSCOPY  04/2004   UC, polyp   COLONOSCOPY  12/2006   UC, no polyps   CORONARY STENT INTERVENTION N/A 04/06/2020   Procedure: CORONARY STENT INTERVENTION;  Surgeon: Mady Bruckner, MD;  Location: [facility];  Service: Cardiovascular;  Laterality: N/A;   DEXA  04/1999 and 2010   normal   ESOPHAGOGASTRODUODENOSCOPY  09/2001   polyp   exercise stress test  11/2003   negative   FEMUR FRACTURE SURGERY Left    LEFT HEART CATH AND CORONARY ANGIOGRAPHY N/A 04/06/2020   Procedure: LEFT HEART CATH AND CORONARY ANGIOGRAPHY;  Surgeon: Perla Evalene PARAS, MD;  Location: [facility];  Service: Cardiovascular;  Laterality: N/A;   MASTECTOMY Bilateral     MECKEL DIVERTICULUM EXCISION  1999   NASAL SINUS SURGERY  05/1997   nuclear stress test  06/2007   negative   precancerous mole removed     RIGHT AND LEFT HEART CATH     with 1 stent placed in March 2022   SIMPLE MASTECTOMY WITH AXILLARY SENTINEL NODE BIOPSY Bilateral 05/23/2014   Procedure: Bilateral simple mastectomy, left sentinel node biopsy ;  Surgeon: Louanne KANDICE Muse, MD;  Location: [facility];  Service: General;  Laterality: Bilateral;   Sleep study  11/2007   no apnea, but did snore (done by [facility])   TONSILLECTOMY  1984   TUBAL LIGATION     WISDOM TOOTH EXTRACTION  1980's   Social History   Tobacco Use   Smoking status: Former    Current packs/day: 0.00    Average packs/day: 0.3 packs/day for 5.0 years (1.3 ttl pk-yrs)    Types: Cigarettes    Start date: 01/15/1988    Quit date: 01/14/1993    Years since quitting: 30.6   Smokeless tobacco: Never  Vaping Use   Vaping status: Never Used  Substance Use Topics   Alcohol use: No    Alcohol/week: 0.0 standard drinks of alcohol    Comment: Recovered ETOH   Drug use: No   Family History  Problem Relation Age of Onset   Coronary artery disease Father    Colon cancer Father    Alzheimer's disease Father    Dementia Father    Colon polyps Father    Coronary artery disease Mother    Hypertension Mother    Osteoporosis Mother    Crohn's disease Mother        crohns colitis   Breast cancer Other        great aunts   Coronary artery disease Other        Uncle (also AAA)   Diabetes Other        remote family history   Stroke  Cousin    Esophageal cancer Neg Hx    Rectal cancer Neg Hx    Stomach cancer Neg Hx    Allergies  Allergen Reactions   Ephedrine Other (See Comments)    Pt becomes hyper   Oxycodone  Other (See Comments)    Panic Attack   Aspirin  Other (See Comments)    REACTION: aggrivates colitis Tolerates 81 mg but unable to take doses >81 mg   Erythromycin Other (See Comments)    REACTION: GI  upset   Nsaids     REACTION: aggrivate colitis- GI ok'd for short term use   Rosuvastatin Other (See Comments)    REACTION: increased lfts   Current Outpatient Medications on File Prior to Visit  Medication Sig Dispense Refill   acetaminophen  (TYLENOL ) 650 MG CR tablet Take 1,300 mg by mouth 2 (two) times daily.     albuterol  (VENTOLIN  HFA) 108 (90 Base) MCG/ACT inhaler INHALE 1 PUFF INTO THE LUNGS EVERY 4 HOURS AS NEEDED FOR WHEEZING 1 each 3   amantadine  (SYMMETREL ) 100 MG capsule Take 100 mg by mouth 2 (two) times daily.     Ascorbic Acid  (VITAMIN C  PO) Take 1,000 mg by mouth daily.     aspirin  81 MG chewable tablet Chew 1 tablet (81 mg total) by mouth daily. 90 tablet 0   atorvastatin  (LIPITOR ) 40 MG tablet TAKE 1 TABLET BY MOUTH ONCE DAILY 90 tablet 3   B Complex Vitamins (VITAMIN B COMPLEX PO) Take 1 tablet by mouth every other day.     BAYER MICROLET LANCETS lancets USE AS DIRECTED TO CHECK BLOOD SUGAR TWICE DAILY 200 each 2   Blood Glucose Monitoring Suppl (CONTOUR NEXT MONITOR) w/Device KIT 1 each by Does not apply route in the morning and at bedtime. Use Contour Next meter to check blood sugar twice daily. 1 kit 0   Calcium  Carb-Cholecalciferol  (CALTRATE 600+D3 PO) Take 1 tablet by mouth daily. 300 mg Vitamin D3     cyclobenzaprine  (FLEXERIL ) 5 MG tablet TAKE 1 TABLET BY MOUTH EVERY 8 HOURS AS NEEDED FOR MUSCLE SPASMS (BACK PAIN) 30 tablet 1   diazepam  (VALIUM ) 10 MG tablet Take 2.5-7.5 mg by mouth daily as needed for anxiety.     docusate sodium  (COLACE) 100 MG capsule Take 100-300 mg by mouth daily as needed for mild constipation or moderate constipation.     Evolocumab  (REPATHA  SURECLICK) 140 MG/ML SOAJ INJECT 140MG  SUBCUTANEOUSLY EVERY 2 WEEKS 6 mL 3   famotidine  (PEPCID ) 40 MG tablet Take 40 mg by mouth 2 (two) times daily. 30 min. Before meal and at bedtime     fluticasone  (FLONASE ) 50 MCG/ACT nasal spray Place 1 spray into both nostrils daily. 16 g 6   gabapentin  (NEURONTIN )  300 MG capsule TAKE 1 CAPSULE BY MOUTH 3 TIMES DAILY 270 capsule 1   glucose blood (CONTOUR NEXT TEST) test strip Use to check blood sugar twice a day. 200 each 5   JARDIANCE  25 MG TABS tablet TAKE 1 TABLET BY MOUTH ONCE DAILY 14 tablet 2   lamoTRIgine  (LAMICTAL ) 200 MG tablet Take 400 mg by mouth daily.     latanoprost  (XALATAN ) 0.005 % ophthalmic solution Place 1 drop into both eyes 2 (two) times a week.      levothyroxine  (LEVOXYL ) 200 MCG tablet Take 1 tablet (200 mcg total) by mouth daily before breakfast. 90 tablet 3   loperamide  (IMODIUM  A-D) 2 MG tablet Take 2 mg by mouth daily as needed for diarrhea or loose stools.  Melatonin 10 MG CAPS Take 10 mg by mouth at bedtime.     mesalamine  (LIALDA ) 1.2 g EC tablet Take 2 tablets (2.4 g total) by mouth daily. Office visit for further refills 180 tablet 0   metoprolol  tartrate (LOPRESSOR ) 25 MG tablet Take 1 tablet (25 mg total) by mouth 2 (two) times daily as needed (for tachycardia). 60 tablet 3   Multiple Vitamin (MULTIVITAMIN) tablet Take 1 tablet by mouth daily. Woman 50 plus     nitroGLYCERIN  (NITROSTAT ) 0.4 MG SL tablet Place 1 tablet (0.4 mg total) under the tongue every 5 (five) minutes as needed for chest pain. 25 tablet 3   ondansetron  (ZOFRAN -ODT) 4 MG disintegrating tablet DISSOLVE 1 TABLET ON THE TONGUE EVERY 6 HOURS AS NEEDED FOR NAUSEA OR VOMIT 90 tablet 1   pantoprazole  (PROTONIX ) 40 MG tablet TAKE 1 TABLET BY MOUTH TWICE DAILY 180 tablet 1   polyethylene glycol (MIRALAX  / GLYCOLAX ) 17 g packet Take 17 g by mouth 3 (three) times daily as needed for moderate constipation, mild constipation or severe constipation. In water  or beverage     Probiotic Product (CULTURELLE PROBIOTICS PO) Take 1 capsule by mouth daily. For woman     QUEtiapine  (SEROQUEL  XR) 400 MG 24 hr tablet Take 800 mg by mouth at bedtime.     simethicone  (MYLICON) 80 MG chewable tablet Chew 80 mg by mouth 4 (four) times daily.     tirzepatide  (MOUNJARO ) 15 MG/0.5ML  Pen Inject 15 mg into the skin once a week. 6 mL 4   valACYclovir  (VALTREX ) 1000 MG tablet TAKE 1 TABLET BY MOUTH TWICE DAILY FOR 5DAYS AS NEEDED FOR OUTBREAK (Patient taking differently: Take 1,000 mg by mouth daily.) 90 tablet 0   VRAYLAR  capsule Take 3 mg by mouth at bedtime.     zaleplon (SONATA) 10 MG capsule Take 10-20 mg by mouth at bedtime.     dexmethylphenidate (FOCALIN) 10 MG tablet Take 10 mg by mouth 3 (three) times daily. (Patient not taking: Reported on 08/18/2023)     dextroamphetamine  (DEXEDRINE  SPANSULE) 15 MG 24 hr capsule Take 5 mg by mouth daily as needed. (Patient not taking: Reported on 08/18/2023)     No current facility-administered medications on file prior to visit.    Review of Systems  Constitutional:  Negative for activity change, appetite change, fatigue, fever and unexpected weight change.  HENT:  Negative for congestion, ear pain, rhinorrhea, sinus pressure and sore throat.   Eyes:  Negative for pain, redness and visual disturbance.  Respiratory:  Negative for cough, shortness of breath and wheezing.   Cardiovascular:  Negative for chest pain and palpitations.  Gastrointestinal:  Negative for abdominal pain, blood in stool, constipation and diarrhea.  Endocrine: Negative for polydipsia and polyuria.  Genitourinary:  Positive for dysuria, frequency and urgency. Negative for difficulty urinating, flank pain and hematuria.  Musculoskeletal:  Negative for arthralgias, back pain and myalgias.  Skin:  Negative for pallor and rash.  Allergic/Immunologic: Negative for environmental allergies.  Neurological:  Negative for dizziness, syncope and headaches.  Hematological:  Negative for adenopathy. Does not bruise/bleed easily.  Psychiatric/Behavioral:  Negative for decreased concentration and dysphoric mood. The patient is not nervous/anxious.        Objective:   Physical Exam Constitutional:      General: She is not in acute distress.    Appearance: She is  well-developed.  HENT:     Head: Normocephalic and atraumatic.  Eyes:     Conjunctiva/sclera: Conjunctivae normal.  Pupils: Pupils are equal, round, and reactive to light.  Neck:     Thyroid : No thyromegaly.     Vascular: No carotid bruit or JVD.  Cardiovascular:     Rate and Rhythm: Normal rate and regular rhythm.     Heart sounds: Normal heart sounds.     No gallop.  Pulmonary:     Effort: Pulmonary effort is normal. No respiratory distress.     Breath sounds: Normal breath sounds. No wheezing or rales.  Abdominal:     General: There is no distension or abdominal bruit.     Palpations: Abdomen is soft. There is no mass.     Tenderness: There is no right CVA tenderness, left CVA tenderness, guarding or rebound.     Comments: Mild suprapubic tenderness without distension    Musculoskeletal:     Cervical back: Normal range of motion and neck supple.     Right lower leg: No edema.     Left lower leg: No edema.  Lymphadenopathy:     Cervical: No cervical adenopathy.  Skin:    General: Skin is warm and dry.     Coloration: Skin is not pale.     Findings: No rash.  Neurological:     Mental Status: She is alert.     Coordination: Coordination normal.     Deep Tendon Reflexes: Reflexes are normal and symmetric. Reflexes normal.  Psychiatric:        Mood and Affect: Mood normal.           Assessment & Plan:   Problem List Items Addressed This Visit       Other   Dysuria - Primary   Ongoing after presumed uti (first treatment with keflex  then cipro  then macrobid ) 2 cultures neg since keflex   Urinalysis clear /improved  Per pt dysuria and bladder discomfort persist Does take jardiance  - causes glucosuria and can predispose to uti No vaginal symptoms at all  Taking uro-mp  (urinary analgesic)-is helpful/refilled ? If some bladder inflammation since uti  Instructed to drink only water / avoid acid and spicy food /bev Update in a week or earlier if worse   Call  back and Er precautions noted in detail today    Consider urology ref if symptoms persist or worsen  Reviewed virtual visit as well as UC visit and results for urinalysis and 2 urine cultures       Relevant Orders   POCT Urinalysis Dipstick (Automated) (Completed)   POCT UA - Microscopic Only (Completed)

## 2023-08-18 NOTE — Assessment & Plan Note (Addendum)
 Ongoing after presumed uti (first treatment with keflex  then cipro  then macrobid ) 2 cultures neg since keflex   Urinalysis clear /improved  Per pt dysuria and bladder discomfort persist Does take jardiance  - causes glucosuria and can predispose to uti No vaginal symptoms at all  Taking uro-mp  (urinary analgesic)-is helpful/refilled ? If some bladder inflammation since uti  Instructed to drink only water / avoid acid and spicy food /bev Update in a week or earlier if worse   Call back and Er precautions noted in detail today    Consider urology ref if symptoms persist or worsen  Reviewed virtual visit as well as UC visit and results for urinalysis and 2 urine cultures

## 2023-08-18 NOTE — Patient Instructions (Addendum)
 Keep drinking lots of water   Keep urine dilute  Uro-mp  as needed for symptoms   Avoid beverages other than water   Avoid a lot of acidic foods or beverages- citrus/ cranberry  Avoid spicy foods also   If your symptoms worsen or do not improve in the next week  then let us  know   Also if any vaginal symptoms

## 2023-08-20 ENCOUNTER — Telehealth: Payer: Self-pay | Admitting: Internal Medicine

## 2023-08-20 DIAGNOSIS — R110 Nausea: Secondary | ICD-10-CM

## 2023-08-20 NOTE — Telephone Encounter (Signed)
 Inbound call from patient requesting a refill for ondansetron  to be sent to [facility] at [facility] in [facility]. Please advise, thank you.

## 2023-08-21 MED ORDER — ONDANSETRON 4 MG PO TBDP: ORAL_TABLET | ORAL | 1 refills | Status: DC

## 2023-08-21 NOTE — Telephone Encounter (Signed)
 Zofran  refilled to [facility]

## 2023-09-02 ENCOUNTER — Encounter: Payer: Self-pay | Admitting: Endocrinology

## 2023-09-02 ENCOUNTER — Other Ambulatory Visit: Payer: Self-pay

## 2023-09-02 DIAGNOSIS — E669 Obesity, unspecified: Secondary | ICD-10-CM

## 2023-09-02 MED ORDER — EMPAGLIFLOZIN 25 MG PO TABS: 25.0000 mg | ORAL_TABLET | Freq: Every day | ORAL | 6 refills | Status: DC

## 2023-09-04 ENCOUNTER — Other Ambulatory Visit: Payer: Self-pay | Admitting: Family Medicine

## 2023-09-04 NOTE — Telephone Encounter (Signed)
 Last OV was an acute appt for urinary issues on 08/18/23.   Last filled on 05/23/23 #90 tab/ 0 refill

## 2023-09-04 NOTE — Telephone Encounter (Signed)
 Before I refill  How often is she taking/ needing this and how is she taking it? Thanks

## 2023-09-09 ENCOUNTER — Other Ambulatory Visit

## 2023-09-09 NOTE — Telephone Encounter (Signed)
 Left VM requesting pt to call the office back

## 2023-09-11 ENCOUNTER — Other Ambulatory Visit

## 2023-09-11 LAB — T4, FREE: Free T4: 1.3 ng/dL (ref 0.8–1.8)

## 2023-09-11 LAB — TSH: TSH: 1.57 m[IU]/L (ref 0.40–4.50)

## 2023-09-12 NOTE — Telephone Encounter (Signed)
 So are you taking it for suppresion and not just for flares?  If this is for suppression - daily dose is generally lower (500 mg) and we usually stop it after no flare for a year  the-guidelines change  Let me know - I may have to adjust dose  Thanks

## 2023-09-12 NOTE — Telephone Encounter (Signed)
 See pt's mychart response regarding Dr. Melman questions

## 2023-09-16 ENCOUNTER — Inpatient Hospital Stay (HOSPITAL_BASED_OUTPATIENT_CLINIC_OR_DEPARTMENT_OTHER): Admitting: Internal Medicine

## 2023-09-16 ENCOUNTER — Encounter: Payer: Self-pay | Admitting: Internal Medicine

## 2023-09-16 ENCOUNTER — Inpatient Hospital Stay: Payer: Self-pay | Attending: Internal Medicine

## 2023-09-16 VITALS — BP 156/92 | HR 100 | Temp 97.7°F | Resp 16 | Ht 66.0 in | Wt 142.0 lb

## 2023-09-16 DIAGNOSIS — Z853 Personal history of malignant neoplasm of breast: Secondary | ICD-10-CM | POA: Insufficient documentation

## 2023-09-16 DIAGNOSIS — K5190 Ulcerative colitis, unspecified, without complications: Secondary | ICD-10-CM | POA: Insufficient documentation

## 2023-09-16 DIAGNOSIS — C50812 Malignant neoplasm of overlapping sites of left female breast: Secondary | ICD-10-CM

## 2023-09-16 DIAGNOSIS — D751 Secondary polycythemia: Secondary | ICD-10-CM | POA: Insufficient documentation

## 2023-09-16 DIAGNOSIS — Z87891 Personal history of nicotine dependence: Secondary | ICD-10-CM | POA: Insufficient documentation

## 2023-09-16 DIAGNOSIS — Z955 Presence of coronary angioplasty implant and graft: Secondary | ICD-10-CM | POA: Insufficient documentation

## 2023-09-16 DIAGNOSIS — Z803 Family history of malignant neoplasm of breast: Secondary | ICD-10-CM | POA: Insufficient documentation

## 2023-09-16 DIAGNOSIS — R634 Abnormal weight loss: Secondary | ICD-10-CM | POA: Insufficient documentation

## 2023-09-16 DIAGNOSIS — I2510 Atherosclerotic heart disease of native coronary artery without angina pectoris: Secondary | ICD-10-CM | POA: Insufficient documentation

## 2023-09-16 DIAGNOSIS — I252 Old myocardial infarction: Secondary | ICD-10-CM | POA: Insufficient documentation

## 2023-09-16 LAB — CBC WITH DIFFERENTIAL (CANCER CENTER ONLY)
Abs Immature Granulocytes: 0.01 K/uL (ref 0.00–0.07)
Basophils Absolute: 0.1 K/uL (ref 0.0–0.1)
Basophils Relative: 1 %
Eosinophils Absolute: 0.3 K/uL (ref 0.0–0.5)
Eosinophils Relative: 5 %
HCT: 42.9 % (ref 36.0–46.0)
Hemoglobin: 14.6 g/dL (ref 12.0–15.0)
Immature Granulocytes: 0 %
Lymphocytes Relative: 33 %
Lymphs Abs: 1.8 K/uL (ref 0.7–4.0)
MCH: 33.8 pg (ref 26.0–34.0)
MCHC: 34 g/dL (ref 30.0–36.0)
MCV: 99.3 fL (ref 80.0–100.0)
Monocytes Absolute: 0.5 K/uL (ref 0.1–1.0)
Monocytes Relative: 8 %
Neutro Abs: 2.8 K/uL (ref 1.7–7.7)
Neutrophils Relative %: 53 %
Platelet Count: 248 K/uL (ref 150–400)
RBC: 4.32 MIL/uL (ref 3.87–5.11)
RDW: 11.7 % (ref 11.5–15.5)
WBC Count: 5.4 K/uL (ref 4.0–10.5)
nRBC: 0 % (ref 0.0–0.2)

## 2023-09-16 LAB — CMP (CANCER CENTER ONLY)
ALT: 30 U/L (ref 0–44)
AST: 36 U/L (ref 15–41)
Albumin: 4.5 g/dL (ref 3.5–5.0)
Alkaline Phosphatase: 64 U/L (ref 38–126)
Anion gap: 8 (ref 5–15)
BUN: 15 mg/dL (ref 8–23)
CO2: 24 mmol/L (ref 22–32)
Calcium: 9.1 mg/dL (ref 8.9–10.3)
Chloride: 103 mmol/L (ref 98–111)
Creatinine: 0.76 mg/dL (ref 0.44–1.00)
GFR, Estimated: >60 mL/min (ref >60)
Glucose, Bld: 133 mg/dL — ABNORMAL HIGH (ref 70–99)
Potassium: 3.5 mmol/L (ref 3.5–5.1)
Sodium: 135 mmol/L (ref 135–145)
Total Bilirubin: 0.7 mg/dL (ref 0.0–1.2)
Total Protein: 6.5 g/dL (ref 6.5–8.1)

## 2023-09-16 NOTE — Progress Notes (Signed)
 [facility] OFFICE PROGRESS NOTE  Patient Care Team: Tower, Laine LABOR, MD as PCP - General (Family Medicine) Vincente Grip, MD as Consulting Physician (Psychiatry) Perla Evalene PARAS, MD as Consulting Physician (Cardiology) Cindie Ole DASEN, MD as Consulting Physician (Cardiology) Rennie Cindy SAUNDERS, MD as Consulting Physician (Oncology)   Cancer Staging  History of breast cancer Staging form: Breast, AJCC 7th Edition - Clinical: Stage IA (T1b, N0(sn), M0) - Unsigned Laterality: Bilateral Method of lymph node assessment: Sentinel lymph node biopsy Estrogen receptor status: Positive Progesterone receptor status: Positive HER2 status: Negative Multi-gene signature risk of recurrence: Low risk Staging comments: Kidney frequent changes in the 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  No invasive cancer in the right breast.  Status post bilateral mastectomy    Oncology History Overview Note  1.  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  2, April of 2016 patient had  stereotactic biopsy of abnormal breast lesion in the left breastwhich was positive for invasive carcinoma and ductal carcinoma in situ.  Patient underwent bilateral mastectomy  May 9 , 2016  Patient has invasive carcinoma and left breast with significant changes in the right breast ductal carcinoma in situ patient underwent bilateral mastectomy.  Left breast: T1 b N0 M0 stage IB estrogen and progesterone receptor HER-2/neu- Not overexpressed  2.    Multi-gene analysis with  MAMOPRINT low risk for recurrent disease  Patient was started on letrozole  and calcium  and vitamin D  (June, 2016)   # Hx Anxiety/depression [Dr.kaur; [place]]; Hx of colitis [s/p colo- May 2019]; Obesity -------------------------------------------------------------------------------------  # SURVIVORSHIP-P  DIAGNOSIS:LEFT BREAST CA  ER-POS  STAGE: I        ;GOALS: curative  CURRENT/MOST RECENT THERAPY: Letrozole -x5 years stopped August 2021.    History of breast cancer  05/23/2014 Initial Diagnosis   Carcinoma of left breast   Carcinoma of overlapping sites of left breast in female, estrogen receptor positive (HCC) (Resolved)      INTERVAL HISTORY:  Lindsay Fry 63 y.o.  female pleasant patient above history of stage I breast cancer ER PR positive HER-2 negative currently s/p adjuvant letrozole  currently on surveillance is here for follow-up/.  Patient has lost weight on munjauro. Other wise feels well. Otherwise patient denies any blood in stools or black-colored stools.   Review of Systems  Constitutional:  Positive for malaise/fatigue. Negative for chills, diaphoresis, fever and weight loss.  HENT:  Negative for nosebleeds and sore throat.   Eyes:  Negative for double vision.  Respiratory:  Negative for cough, hemoptysis, sputum production, shortness of breath and wheezing.   Cardiovascular:  Negative for chest pain, palpitations, orthopnea and leg swelling.  Gastrointestinal:  Negative for abdominal pain, blood in stool, constipation, heartburn, melena, nausea and vomiting.  Genitourinary:  Negative for dysuria, frequency and urgency.  Musculoskeletal:  Positive for back pain and joint pain.  Skin: Negative.  Negative for itching and rash.  Neurological:  Negative for dizziness, tingling, focal weakness, weakness and headaches.  Endo/Heme/Allergies:  Does not bruise/bleed easily.  Psychiatric/Behavioral:  Positive for depression. The patient is nervous/anxious and has insomnia.       PAST MEDICAL HISTORY :  Past Medical History:  Diagnosis Date   ADD (attention deficit disorder) 11/19/2012   Allergic rhinitis, cause unspecified    Anxiety state, unspecified    Arthritis    Asymptomatic postmenopausal status (age-related) (natural)  Atypical hyperplasia of left breast 2000   Atypical mole  03/16/2018   R paraspinal mid upper back, excision   Atypical mole 02/17/2018   R spinal upper back, moderate   Atypical mole 08/11/2017   L sternum, excision   Atypical mole 05/27/2017   L post neck, moderate   Benign neoplasm of other and unspecified site of the digestive system    Bipolar disorder (HCC)    Breast cancer (HCC)    Left Breast 6mm invasive CA left uiq, dcis left uoq and a lot ADH, ALH in both breasts.   CAD (coronary artery disease)    a. 08/2014 NSTEMI/Cath: LM nl, LAD 50p, D1/D2 min irregs, LCX nl, OM1 40, LPDA nl, RI 99ost (2.10mm vessel->med Rx), RCA nl, AM 60, nl EF; b. 11/2016 MV: EF 74%, no ischemia (performed 2/2 dyspnea & new inf TWI).   Carcinoma of left breast (HCC) 06/02/2014   Cataract    Chronic diastolic CHF (congestive heart failure) (HCC)    a. 08/2014 Echo: EF 60-65%, Gr 1 DD, nl LA.   Clotting disorder (HCC)    on blood thiner, Plavix    Cyst    on Achilles tendon   Depression    Diabetes mellitus (HCC)    frank   Dysuria    Edema    Family history of malignant neoplasm of gastrointestinal tract    Fibromyalgia    GERD (gastroesophageal reflux disease)    Glaucoma    pre glaucoma, 05/14/2018 pt. denies glaucoma   Headache    migraines   Hiatal hernia    History of alcoholism (HCC)    History of migraines    History of ovarian cyst    Hx of adenomatous colonic polyps    Insomnia, unspecified    Myocardial infarction (HCC) 09-04-14   OCD (obsessive compulsive disorder)    Osteopenia 09/25/2016   Femoral neck T -1.1  9/18   Other screening mammogram    Pure hypercholesterolemia    Rosacea    Subacute confusional state 11/19/2012   Tubular adenoma of colon 01/10/11   Ulcerative colitis, unspecified    Unspecified asthma(493.90)    Unspecified essential hypertension    Unspecified hypothyroidism    Unspecified vitamin D  deficiency    Vertigo     PAST SURGICAL HISTORY :   Past Surgical History:  Procedure Laterality Date   APPENDECTOMY      AXILLARY SENTINEL NODE BIOPSY Left 05/23/2014   Procedure: AXILLARY SENTINEL NODE BIOPSY;  Surgeon: Louanne KANDICE Muse, MD;  Location: [facility] ORS;  Service: General;  Laterality: Left;   BREAST BIOPSY  08/1998   on tamoxifen, atypical hyperplasia   BREAST BIOPSY Left 04/2014   BREAST SURGERY Left 08/1998   lumpectomy/ Dr Rollene   BREAST SURGERY Bilateral 05/23/2014   Mastectomy   BUNIONECTOMY Right    CARDIAC CATHETERIZATION N/A 09/05/2014   Procedure: Left Heart Cath and Coronary Angiography;  Surgeon: Deatrice DELENA Cage, MD;  Location: [facility];  Service: Cardiovascular;  Laterality: N/A;   CARDIAC CATHETERIZATION  09/05/2014   CATARACT EXTRACTION W/ INTRAOCULAR LENS IMPLANT Bilateral    CESAREAN SECTION  1996/1997   placenta previa, gest DM, pre-eclampsia   CHOLECYSTECTOMY  1997   adhesions also   COLONOSCOPY  01/2001   Ulcerative colitis   COLONOSCOPY  04/2004   UC, polyp   COLONOSCOPY  12/2006   UC, no polyps   CORONARY STENT INTERVENTION N/A 04/06/2020   Procedure: CORONARY STENT INTERVENTION;  Surgeon:  End, Lonni, MD;  Location: [facility];  Service: Cardiovascular;  Laterality: N/A;   DEXA  04/1999 and 2010   normal   ESOPHAGOGASTRODUODENOSCOPY  09/2001   polyp   exercise stress test  11/2003   negative   FEMUR FRACTURE SURGERY Left    LEFT HEART CATH AND CORONARY ANGIOGRAPHY N/A 04/06/2020   Procedure: LEFT HEART CATH AND CORONARY ANGIOGRAPHY;  Surgeon: Perla Evalene PARAS, MD;  Location: [facility];  Service: Cardiovascular;  Laterality: N/A;   MASTECTOMY Bilateral    MECKEL DIVERTICULUM EXCISION  1999   NASAL SINUS SURGERY  05/1997   nuclear stress test  06/2007   negative   precancerous mole removed     RIGHT AND LEFT HEART CATH     with 1 stent placed in March 2022   SIMPLE MASTECTOMY WITH AXILLARY SENTINEL NODE BIOPSY Bilateral 05/23/2014   Procedure: Bilateral simple mastectomy, left sentinel node biopsy ;  Surgeon:  Louanne KANDICE Muse, MD;  Location: [facility];  Service: General;  Laterality: Bilateral;   Sleep study  11/2007   no apnea, but did snore (done by [facility])   TONSILLECTOMY  1984   TUBAL LIGATION     WISDOM TOOTH EXTRACTION  1980's    FAMILY HISTORY :   Family History  Problem Relation Age of Onset   Coronary artery disease Father    Colon cancer Father    Alzheimer's disease Father    Dementia Father    Colon polyps Father    Coronary artery disease Mother    Hypertension Mother    Osteoporosis Mother    Crohn's disease Mother        crohns colitis   Breast cancer Other        great aunts   Coronary artery disease Other        Uncle (also AAA)   Diabetes Other        remote family history   Stroke Cousin    Esophageal cancer Neg Hx    Rectal cancer Neg Hx    Stomach cancer Neg Hx     SOCIAL HISTORY:   Social History   Tobacco Use   Smoking status: Former    Current packs/day: 0.00    Average packs/day: 0.3 packs/day for 5.0 years (1.3 ttl pk-yrs)    Types: Cigarettes    Start date: 01/15/1988    Quit date: 01/14/1993    Years since quitting: 30.6   Smokeless tobacco: Never  Vaping Use   Vaping status: Never Used  Substance Use Topics   Alcohol use: No    Alcohol/week: 0.0 standard drinks of alcohol    Comment: Recovered ETOH   Drug use: No    ALLERGIES:  is allergic to ephedrine, oxycodone , aspirin , erythromycin, nsaids, and rosuvastatin.  MEDICATIONS:  Current Outpatient Medications  Medication Sig Dispense Refill   acetaminophen  (TYLENOL ) 650 MG CR tablet Take 1,300 mg by mouth 2 (two) times daily.     albuterol  (VENTOLIN  HFA) 108 (90 Base) MCG/ACT inhaler INHALE 1 PUFF INTO THE LUNGS EVERY 4 HOURS AS NEEDED FOR WHEEZING 1 each 3   amantadine  (SYMMETREL ) 100 MG capsule Take 100 mg by mouth 2 (two) times daily.     Ascorbic Acid  (VITAMIN C  PO) Take 1,000 mg by mouth daily.     aspirin  81 MG chewable tablet Chew 1 tablet (81 mg total) by mouth daily. 90  tablet 0   atorvastatin  (LIPITOR ) 40 MG tablet TAKE 1 TABLET  BY MOUTH ONCE DAILY 90 tablet 3   B Complex Vitamins (VITAMIN B COMPLEX PO) Take 1 tablet by mouth every other day.     BAYER MICROLET LANCETS lancets USE AS DIRECTED TO CHECK BLOOD SUGAR TWICE DAILY 200 each 2   Blood Glucose Monitoring Suppl (CONTOUR NEXT MONITOR) w/Device KIT 1 each by Does not apply route in the morning and at bedtime. Use Contour Next meter to check blood sugar twice daily. 1 kit 0   Calcium  Carb-Cholecalciferol  (CALTRATE 600+D3 PO) Take 1 tablet by mouth daily. 300 mg Vitamin D3     cyclobenzaprine  (FLEXERIL ) 5 MG tablet TAKE 1 TABLET BY MOUTH EVERY 8 HOURS AS NEEDED FOR MUSCLE SPASMS (BACK PAIN) 30 tablet 1   diazepam  (VALIUM ) 10 MG tablet Take 2.5-7.5 mg by mouth daily as needed for anxiety.     docusate sodium  (COLACE) 100 MG capsule Take 100-300 mg by mouth daily as needed for mild constipation or moderate constipation.     empagliflozin  (JARDIANCE ) 25 MG TABS tablet Take 1 tablet (25 mg total) by mouth daily. 30 tablet 6   Evolocumab  (REPATHA  SURECLICK) 140 MG/ML SOAJ INJECT 140MG  SUBCUTANEOUSLY EVERY 2 WEEKS 6 mL 3   famotidine  (PEPCID ) 40 MG tablet Take 40 mg by mouth 2 (two) times daily. 30 min. Before meal and at bedtime     fluticasone  (FLONASE ) 50 MCG/ACT nasal spray Place 1 spray into both nostrils daily. 16 g 6   gabapentin  (NEURONTIN ) 300 MG capsule TAKE 1 CAPSULE BY MOUTH 3 TIMES DAILY 270 capsule 1   glucose blood (CONTOUR NEXT TEST) test strip Use to check blood sugar twice a day. 200 each 5   lamoTRIgine  (LAMICTAL ) 200 MG tablet Take 400 mg by mouth daily.     latanoprost  (XALATAN ) 0.005 % ophthalmic solution Place 1 drop into both eyes 2 (two) times a week.      levothyroxine  (LEVOXYL ) 200 MCG tablet Take 1 tablet (200 mcg total) by mouth daily before breakfast. 90 tablet 3   loperamide  (IMODIUM  A-D) 2 MG tablet Take 2 mg by mouth daily as needed for diarrhea or loose stools.     Melatonin 10 MG  CAPS Take 10 mg by mouth at bedtime.     mesalamine  (LIALDA ) 1.2 g EC tablet Take 2 tablets (2.4 g total) by mouth daily. Office visit for further refills 180 tablet 0   Meth-Hyo-M Bl-Na Phos-Ph Sal (URO-MP ) 118 MG CAPS Take one pill by mouth four times daily as needed for urinary discomfort 60 capsule 0   metoprolol  tartrate (LOPRESSOR ) 25 MG tablet Take 1 tablet (25 mg total) by mouth 2 (two) times daily as needed (for tachycardia). 60 tablet 3   Multiple Vitamin (MULTIVITAMIN) tablet Take 1 tablet by mouth daily. Woman 50 plus     nitroGLYCERIN  (NITROSTAT ) 0.4 MG SL tablet Place 1 tablet (0.4 mg total) under the tongue every 5 (five) minutes as needed for chest pain. 25 tablet 3   ondansetron  (ZOFRAN -ODT) 4 MG disintegrating tablet DISSOLVE 1 TABLET ON THE TONGUE EVERY 6 HOURS AS NEEDED FOR NAUSEA OR VOMIT 90 tablet 1   pantoprazole  (PROTONIX ) 40 MG tablet TAKE 1 TABLET BY MOUTH TWICE DAILY 180 tablet 1   polyethylene glycol (MIRALAX  / GLYCOLAX ) 17 g packet Take 17 g by mouth 3 (three) times daily as needed for moderate constipation, mild constipation or severe constipation. In water  or beverage     Probiotic Product (CULTURELLE PROBIOTICS PO) Take 1 capsule by mouth daily. For woman  QUEtiapine  (SEROQUEL  XR) 400 MG 24 hr tablet Take 800 mg by mouth at bedtime.     simethicone  (MYLICON) 80 MG chewable tablet Chew 80 mg by mouth 4 (four) times daily.     tirzepatide  (MOUNJARO ) 15 MG/0.5ML Pen Inject 15 mg into the skin once a week. 6 mL 4   valACYclovir  (VALTREX ) 1000 MG tablet Take 0.5 tablets (500 mg total) by mouth daily. 45 tablet 1   VRAYLAR  capsule Take 3 mg by mouth at bedtime.     zaleplon (SONATA) 10 MG capsule Take 10-20 mg by mouth at bedtime.     dexmethylphenidate (FOCALIN) 10 MG tablet Take 10 mg by mouth 3 (three) times daily. (Patient not taking: Reported on 09/16/2023)     dextroamphetamine  (DEXEDRINE  SPANSULE) 15 MG 24 hr capsule Take 5 mg by mouth daily as needed. (Patient not  taking: Reported on 09/16/2023)     No current facility-administered medications for this visit.    PHYSICAL EXAMINATION: ECOG PERFORMANCE STATUS: 0 - Asymptomatic  BP (!) 156/92 Comment: patient aware of elevated BP  Pulse 100   Temp 97.7 F (36.5 C) (Tympanic)   Resp 16   Ht 5' 6 (1.676 m)   Wt 142 lb (64.4 kg)   LMP 08/23/2006 (Approximate) Comment: tubal ligation  SpO2 100%   BMI 22.92 kg/m   Filed Weights   09/16/23 1335  Weight: 142 lb (64.4 kg)    Physical Exam Constitutional:      Comments: Alone.   HENT:     Head: Normocephalic and atraumatic.     Mouth/Throat:     Pharynx: No oropharyngeal exudate.  Eyes:     Pupils: Pupils are equal, round, and reactive to light.  Cardiovascular:     Rate and Rhythm: Normal rate and regular rhythm.  Pulmonary:     Effort: Pulmonary effort is normal. No respiratory distress.     Breath sounds: Normal breath sounds. No wheezing.  Abdominal:     General: Bowel sounds are normal. There is no distension.     Palpations: Abdomen is soft. There is no mass.     Tenderness: There is no abdominal tenderness. There is no guarding or rebound.  Musculoskeletal:        General: No tenderness. Normal range of motion.     Cervical back: Normal range of motion and neck supple.  Skin:    General: Skin is warm.     Comments: Bilateral mastectomy noted.  No lumps or bumps.  Neurological:     Mental Status: She is alert and oriented to person, place, and time.  Psychiatric:        Mood and Affect: Affect normal.        LABORATORY DATA:  I have reviewed the data as listed    Component Value Date/Time   NA 135 09/16/2023 1326   K 3.5 09/16/2023 1326   CL 103 09/16/2023 1326   CO2 24 09/16/2023 1326   GLUCOSE 133 (H) 09/16/2023 1326   BUN 15 09/16/2023 1326   CREATININE 0.76 09/16/2023 1326   CALCIUM  9.1 09/16/2023 1326   PROT 6.5 09/16/2023 1326   ALBUMIN 4.5 09/16/2023 1326   AST 36 09/16/2023 1326   ALT 30 09/16/2023  1326   ALKPHOS 64 09/16/2023 1326   BILITOT 0.7 09/16/2023 1326   GFRNONAA >60 09/16/2023 1326   GFRAA >60 08/30/2019 1437    No results found for: SPEP, UPEP  Lab Results  Component Value Date   WBC  5.4 09/16/2023   NEUTROABS 2.8 09/16/2023   HGB 14.6 09/16/2023   HCT 42.9 09/16/2023   MCV 99.3 09/16/2023   PLT 248 09/16/2023      Chemistry      Component Value Date/Time   NA 135 09/16/2023 1326   K 3.5 09/16/2023 1326   CL 103 09/16/2023 1326   CO2 24 09/16/2023 1326   BUN 15 09/16/2023 1326   CREATININE 0.76 09/16/2023 1326      Component Value Date/Time   CALCIUM  9.1 09/16/2023 1326   ALKPHOS 64 09/16/2023 1326   AST 36 09/16/2023 1326   ALT 30 09/16/2023 1326   BILITOT 0.7 09/16/2023 1326       RADIOGRAPHIC STUDIES: I have personally reviewed the radiological images as listed and agreed with the findings in the report. No results found.   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   # LEFT Breast Stage I s/p mastec [prophy right mastec] on Low risk mammoprint- finished Letrozole  [5 years; AUG 2021] STABLE-no clinical evidence of recurrence.   # Obesity/? OSA- s/p sleep study [2015]; not on CPAP- stable.   # CAD [s/p stenting; March 2022]- on asprin/plavix - stable.   # Ulcerative colitis- s/p colo- April 2023-stable.   # BMD- Sep 2022-FEB T score= -1; normal. Continue ca+vit D- stable.   #Since patient is clinically stable I think is reasonable for the patient to follow-up with PCP/can follow-up with us  as needed.  Patient comfortable with the plan; to call us  if any questions or concerns in the interim.  # DISPOSITION: # follow up as needed- Dr.B     No orders of the defined types were placed in this encounter.  All questions were answered. The patient knows to call the clinic with any problems, questions or concerns.      Cindy JONELLE Joe,  MD 09/16/2023 2:19 PM

## 2023-09-16 NOTE — Assessment & Plan Note (Signed)
#   Secondary Erythrocytosis-hemoglobin/hematocrit-15.6/46.7 likely secondary; NEG JAK2 mutation- stable.   # LEFT Breast Stage I s/p mastec [prophy right mastec] on Low risk mammoprint- finished Letrozole  [5 years; AUG 2021] STABLE-no clinical evidence of recurrence.   # Obesity/? OSA- s/p sleep study [2015]; not on CPAP- stable.   # CAD [s/p stenting; March 2022]- on asprin/plavix - stable.   # Ulcerative colitis- s/p colo- April 2023-stable.   # BMD- Sep 2022-FEB T score= -1; normal. Continue ca+vit D- stable.   #Since patient is clinically stable I think is reasonable for the patient to follow-up with PCP/can follow-up with us  as needed.  Patient comfortable with the plan; to call us  if any questions or concerns in the interim.  # DISPOSITION: # follow up as needed- Dr.B

## 2023-09-16 NOTE — Progress Notes (Signed)
 Patient here for follow up; patient denies any concerns at this time.

## 2023-09-17 DIAGNOSIS — F3112 Bipolar disorder, current episode manic without psychotic features, moderate: Secondary | ICD-10-CM | POA: Diagnosis not present

## 2023-09-17 DIAGNOSIS — F411 Generalized anxiety disorder: Secondary | ICD-10-CM | POA: Diagnosis not present

## 2023-09-17 DIAGNOSIS — F3173 Bipolar disorder, in partial remission, most recent episode manic: Secondary | ICD-10-CM | POA: Diagnosis not present

## 2023-09-17 DIAGNOSIS — F901 Attention-deficit hyperactivity disorder, predominantly hyperactive type: Secondary | ICD-10-CM | POA: Diagnosis not present

## 2023-09-20 DIAGNOSIS — S93601A Unspecified sprain of right foot, initial encounter: Secondary | ICD-10-CM | POA: Diagnosis not present

## 2023-09-20 DIAGNOSIS — K51919 Ulcerative colitis, unspecified with unspecified complications: Secondary | ICD-10-CM | POA: Diagnosis not present

## 2023-10-01 ENCOUNTER — Encounter: Payer: Self-pay | Admitting: Endocrinology

## 2023-10-01 DIAGNOSIS — E118 Type 2 diabetes mellitus with unspecified complications: Secondary | ICD-10-CM

## 2023-10-01 DIAGNOSIS — E1169 Type 2 diabetes mellitus with other specified complication: Secondary | ICD-10-CM

## 2023-10-01 MED ORDER — EMPAGLIFLOZIN 25 MG PO TABS: 25.0000 mg | ORAL_TABLET | Freq: Every day | ORAL | 11 refills | Status: AC

## 2023-10-01 MED ORDER — ONETOUCH VERIO VI STRP: ORAL_STRIP | 12 refills | Status: DC

## 2023-10-01 MED ORDER — ONETOUCH VERIO REFLECT W/DEVICE KIT: PACK | 0 refills | Status: AC

## 2023-10-01 MED ORDER — ONETOUCH DELICA PLUS LANCET33G MISC: 12 refills | Status: AC

## 2023-10-01 MED ORDER — TIRZEPATIDE 15 MG/0.5ML ~~LOC~~ SOAJ: 15.0000 mg | SUBCUTANEOUS | 4 refills | Status: DC

## 2023-10-01 MED ORDER — TIRZEPATIDE 15 MG/0.5ML ~~LOC~~ SOAJ: 15.0000 mg | SUBCUTANEOUS | 3 refills | Status: AC

## 2023-10-01 MED ORDER — EMPAGLIFLOZIN 25 MG PO TABS
25.0000 mg | ORAL_TABLET | Freq: Every day | ORAL | 11 refills | Status: DC
Start: 2023-10-01 — End: 2023-10-01

## 2023-10-01 MED ORDER — ONETOUCH VERIO VI STRP: ORAL_STRIP | 12 refills | Status: AC

## 2023-10-01 NOTE — Addendum Note (Signed)
 Addended by: CLEOTILDE ROLIN RAMAN on: 10/01/2023 02:49 PM   Modules accepted: Orders

## 2023-10-01 NOTE — Addendum Note (Signed)
 Addended by: CLEOTILDE ROLIN RAMAN on: 10/01/2023 02:48 PM   Modules accepted: Orders

## 2023-10-07 ENCOUNTER — Ambulatory Visit: Payer: BC Managed Care – PPO | Admitting: Dermatology

## 2023-10-07 DIAGNOSIS — L719 Rosacea, unspecified: Secondary | ICD-10-CM | POA: Diagnosis not present

## 2023-10-07 DIAGNOSIS — L219 Seborrheic dermatitis, unspecified: Secondary | ICD-10-CM | POA: Diagnosis not present

## 2023-10-07 DIAGNOSIS — D225 Melanocytic nevi of trunk: Secondary | ICD-10-CM

## 2023-10-07 DIAGNOSIS — W908XXA Exposure to other nonionizing radiation, initial encounter: Secondary | ICD-10-CM

## 2023-10-07 DIAGNOSIS — Z1283 Encounter for screening for malignant neoplasm of skin: Secondary | ICD-10-CM

## 2023-10-07 DIAGNOSIS — Z86018 Personal history of other benign neoplasm: Secondary | ICD-10-CM | POA: Diagnosis not present

## 2023-10-07 DIAGNOSIS — L814 Other melanin hyperpigmentation: Secondary | ICD-10-CM

## 2023-10-07 DIAGNOSIS — D1801 Hemangioma of skin and subcutaneous tissue: Secondary | ICD-10-CM

## 2023-10-07 DIAGNOSIS — L578 Other skin changes due to chronic exposure to nonionizing radiation: Secondary | ICD-10-CM | POA: Diagnosis not present

## 2023-10-07 DIAGNOSIS — D2221 Melanocytic nevi of right ear and external auricular canal: Secondary | ICD-10-CM | POA: Diagnosis not present

## 2023-10-07 DIAGNOSIS — L821 Other seborrheic keratosis: Secondary | ICD-10-CM

## 2023-10-07 DIAGNOSIS — L853 Xerosis cutis: Secondary | ICD-10-CM | POA: Diagnosis not present

## 2023-10-07 DIAGNOSIS — D229 Melanocytic nevi, unspecified: Secondary | ICD-10-CM

## 2023-10-07 DIAGNOSIS — D2261 Melanocytic nevi of right upper limb, including shoulder: Secondary | ICD-10-CM | POA: Diagnosis not present

## 2023-10-07 MED ORDER — KETOCONAZOLE 2 % EX CREA: TOPICAL_CREAM | CUTANEOUS | 11 refills | Status: AC

## 2023-10-07 NOTE — Progress Notes (Signed)
 Follow-Up Visit   Subjective  Lindsay Fry is a 63 y.o. female who presents for the following: Skin Cancer Screening and Full Body Skin Exam Hx of dysplastic nevi, hx of isks, rosacea, and seb derm.  The patient presents for Total-Body Skin Exam (TBSE) for skin cancer screening and mole check. The patient has spots, moles and lesions to be evaluated, some may be new or changing and the patient may have concern these could be cancer.    The following portions of the chart were reviewed this encounter and updated as appropriate: medications, allergies, medical history  Review of Systems:  No other skin or systemic complaints except as noted in HPI or Assessment and Plan.  Objective  Well appearing patient in no apparent distress; mood and affect are within normal limits.  A full examination was performed including scalp, head, eyes, ears, nose, lips, neck, chest, axillae, abdomen, back, buttocks, bilateral upper extremities, bilateral lower extremities, hands, feet, fingers, toes, fingernails, and toenails. All findings within normal limits unless otherwise noted below.   Relevant physical exam findings are noted in the Assessment and Plan.    Assessment & Plan   SKIN CANCER SCREENING PERFORMED TODAY.  ACTINIC DAMAGE - Chronic condition, secondary to cumulative UV/sun exposure - diffuse scaly erythematous macules with underlying dyspigmentation - Recommend daily broad spectrum sunscreen SPF 30+ to sun-exposed areas, reapply every 2 hours as needed.  - Staying in the shade or wearing long sleeves, sun glasses (UVA+UVB protection) and wide brim hats (4-inch brim around the entire circumference of the hat) are also recommended for sun protection.  - Call for new or changing lesions.  LENTIGINES, SEBORRHEIC KERATOSES, HEMANGIOMAS - Benign normal skin lesions - Benign-appearing - Call for any changes  MELANOCYTIC NEVI - Right posterior shoulder 3 mm medium brown papule  lighter superior  - right earlobe 5 mm brown macule  - Right Lower Back 7 x 3 mm speckled brown papule  - right lower sternum 4 mm medium brown macule when compared to photos from 05/11/2019 - spinal upper back 10 x 7 mm brown speckled papule stable when compared to photos from 09/11/2021 - left upper back 7 x 5 mm speckled brown macule - Tan-brown and/or pink-flesh-colored symmetric macules and papules - Benign appearing on exam today - Observation - Call clinic for new or changing moles - Recommend daily use of broad spectrum spf 30+ sunscreen to sun-exposed areas.   Xerosis - diffuse xerotic patches - recommend gentle, hydrating skin care - gentle skin care handout given Recommend otc amlactin cream  ROSACEA Exam Mild/mod erythema with telangiectasias of the malar cheeks and nose   Chronic condition with duration or expected duration over one year. Currently well-controlled.      Rosacea is a chronic progressive skin condition usually affecting the face of adults, causing redness and/or acne bumps. It is treatable but not curable. It sometimes affects the eyes (ocular rosacea) as well. It may respond to topical and/or systemic medication and can flare with stress, sun exposure, alcohol, exercise, topical steroids (including hydrocortisone/cortisone 10) and some foods.  Daily application of broad spectrum spf 30+ sunscreen to face is recommended to reduce flares.   Patient denies grittiness of the eyes   Treatment Plan Continue Skin Medicinals Triple Cream at bedtime prn.  She stopped it over the summer, but plans to restart this fall.   SEBORRHEIC DERMATITIS Exam: mild scaling of the scalp; face clear.   Chronic condition with duration or expected duration  over one year. Currently well-controlled.    Seborrheic Dermatitis is a chronic persistent rash characterized by pinkness and scaling most commonly of the mid face but also can occur on the scalp (dandruff), ears; mid chest,  mid back and groin.  It tends to be exacerbated by stress and cooler weather.  People who have neurologic disease may experience new onset or exacerbation of existing seborrheic dermatitis.  The condition is not curable but treatable and can be controlled.   Treatment Plan: Continue ketoconazole  2% cream 1-2 times daily as needed to aas face. Start Head & Shoulders Supreme shampoo for colored hair - massage into scalp and let sit several minutes before rinsing.   HISTORY OF DYSPLASTIC NEVUS Multiple locations see history  No evidence of recurrence today Recommend regular full body skin exams Recommend daily broad spectrum sunscreen SPF 30+ to sun-exposed areas, reapply every 2 hours as needed.  Call if any new or changing lesions are noted between office visits   SEBORRHEIC DERMATITIS   Related Medications ketoconazole  (NIZORAL ) 2 % cream Apply to affected areas one to two times daily as needed. For seborrheic dermatitis at face Return in about 1 year (around 10/06/2024) for TBSE.  I, Eleanor Blush, CMA, am acting as scribe for Rexene Rattler, MD.   Documentation: I have reviewed the above documentation for accuracy and completeness, and I agree with the above.  Rexene Rattler, MD

## 2023-10-07 NOTE — Patient Instructions (Addendum)
 Recommend otc amlactin for dry skin    Melanoma ABCDEs  Melanoma is the most dangerous type of skin cancer, and is the leading cause of death from skin disease.  You are more likely to develop melanoma if you: Have light-colored skin, light-colored eyes, or red or blond hair Spend a lot of time in the sun Tan regularly, either outdoors or in a tanning bed Have had blistering sunburns, especially during childhood Have a close family member who has had a melanoma Have atypical moles or large birthmarks  Early detection of melanoma is key since treatment is typically straightforward and cure rates are extremely high if we catch it early.   The first sign of melanoma is often a change in a mole or a new dark spot.  The ABCDE system is a way of remembering the signs of melanoma.  A for asymmetry:  The two halves do not match. B for border:  The edges of the growth are irregular. C for color:  A mixture of colors are present instead of an even brown color. D for diameter:  Melanomas are usually (but not always) greater than 6mm - the size of a pencil eraser. E for evolution:  The spot keeps changing in size, shape, and color.  Please check your skin once per month between visits. You can use a small mirror in front and a large mirror behind you to keep an eye on the back side or your body.   If you see any new or changing lesions before your next follow-up, please call to schedule a visit.  Please continue daily skin protection including broad spectrum sunscreen SPF 30+ to sun-exposed areas, reapplying every 2 hours as needed when you're outdoors.   Staying in the shade or wearing long sleeves, sun glasses (UVA+UVB protection) and wide brim hats (4-inch brim around the entire circumference of the hat) are also recommended for sun protection.    Due to recent changes in healthcare laws, you may see results of your pathology and/or laboratory studies on MyChart before the doctors have had a  chance to review them. We understand that in some cases there may be results that are confusing or concerning to you. Please understand that not all results are received at the same time and often the doctors may need to interpret multiple results in order to provide you with the best plan of care or course of treatment. Therefore, we ask that you please give us  2 business days to thoroughly review all your results before contacting the office for clarification. Should we see a critical lab result, you will be contacted sooner.   If You Need Anything After Your Visit  If you have any questions or concerns for your doctor, please call our main line at [phone] and press option 4 to reach your doctor's medical assistant. If no one answers, please leave a voicemail as directed and we will return your call as soon as possible. Messages left after 4 pm will be answered the following business day.   You may also send us  a message via MyChart. We typically respond to MyChart messages within 1-2 business days.  For prescription refills, please ask your pharmacy to contact our office. Our fax number is [phone].  If you have an urgent issue when the clinic is closed that cannot wait until the next business day, you can page your doctor at the number below.    Please note that while we do our best to be  available for urgent issues outside of office hours, we are not available 24/7.   If you have an urgent issue and are unable to reach us , you may choose to seek medical care at your doctor's office, retail clinic, urgent care center, or emergency room.  If you have a medical emergency, please immediately call 911 or go to the emergency department.  Pager Numbers  - Dr. Hester: [phone]  - Dr. Jackquline: [phone]  - Dr. Claudene: [phone]   - Dr. Raymund: [phone]  In the event of inclement weather, please call our main line at [phone] for an update on the status of any  delays or closures.  Dermatology Medication Tips: Please keep the boxes that topical medications come in in order to help keep track of the instructions about where and how to use these. Pharmacies typically print the medication instructions only on the boxes and not directly on the medication tubes.   If your medication is too expensive, please contact our office at [phone] option 4 or send us  a message through MyChart.   We are unable to tell what your co-pay for medications will be in advance as this is different depending on your insurance coverage. However, we may be able to find a substitute medication at lower cost or fill out paperwork to get insurance to cover a needed medication.   If a prior authorization is required to get your medication covered by your insurance company, please allow us  1-2 business days to complete this process.  Drug prices often vary depending on where the prescription is filled and some pharmacies may offer cheaper prices.  The website www.goodrx.com contains coupons for medications through different pharmacies. The prices here do not account for what the cost may be with help from insurance (it may be cheaper with your insurance), but the website can give you the price if you did not use any insurance.  - You can print the associated coupon and take it with your prescription to the pharmacy.  - You may also stop by our office during regular business hours and pick up a GoodRx coupon card.  - If you need your prescription sent electronically to a different pharmacy, notify our office through [facility] or by phone at [phone] option 4.     Si Usted Necesita Algo Despus de Su Visita  Tambin puede enviarnos un mensaje a travs de Clinical cytogeneticist. Por lo general respondemos a los mensajes de MyChart en el transcurso de 1 a 2 das hbiles.  Para renovar recetas, por favor pida a su farmacia que se ponga en contacto con nuestra oficina. Randi lakes de fax es [place] [phone].  Si tiene un asunto urgente cuando la clnica est cerrada y que no puede esperar hasta el siguiente da hbil, puede llamar/localizar a su doctor(a) al nmero que aparece a continuacin.   Por favor, tenga en cuenta que aunque hacemos todo lo posible para estar disponibles para asuntos urgentes fuera del horario de [place], no estamos disponibles las 24 horas del da, los 7 [address]  [address] de la [place].   Si tiene un problema urgente y no puede comunicarse con nosotros, puede optar por buscar atencin mdica  en el consultorio de su doctor(a), en una clnica privada, en un centro de atencin urgente o en una sala de emergencias.  Si tiene Engineer, drilling, por favor llame inmediatamente al 911 o vaya a la sala de emergencias.  Nmeros de bper  - Dr. Hester: [phone]  - Dra.  Jackquline: [phone]  - Dr. Claudene: [phone]  - Dra. Kitts: [phone]  En caso de inclemencias del [place], por favor llame a nuestra lnea principal al [phone] para una actualizacin sobre el estado de cualquier retraso o cierre.  Consejos para la medicacin en dermatologa: Por favor, guarde las cajas en las que vienen los medicamentos de uso tpico para ayudarle a seguir las instrucciones sobre dnde y cmo usarlos. Las farmacias generalmente imprimen las instrucciones del medicamento slo en las cajas y no directamente en los tubos del [place].   Si su medicamento es muy caro, por favor, pngase en contacto con landry rieger llamando al [phone] y presione la opcin 4 o envenos un mensaje a travs de Clinical cytogeneticist.   No podemos decirle cul ser su copago por los medicamentos por adelantado ya que esto es diferente dependiendo de la cobertura de su seguro. Sin embargo, es posible que podamos encontrar un medicamento sustituto a Audiological scientist un formulario para que el seguro cubra el medicamento que se considera necesario.   Si se requiere una autorizacin  previa para que su compaa de seguros malta su medicamento, por favor permtanos de 1 a 2 das hbiles para completar este proceso.  Los precios de los medicamentos varan con frecuencia dependiendo del Environmental consultant de dnde se surte la receta y alguna farmacias pueden ofrecer precios ms baratos.  El sitio web www.goodrx.com tiene cupones para medicamentos de Health and safety inspector. Los precios aqu no tienen en cuenta lo que podra costar con la ayuda del seguro (puede ser ms barato con su seguro), pero el sitio web puede darle el precio si no utiliz Tourist information centre manager.  - Puede imprimir el cupn correspondiente y llevarlo con su receta a la farmacia.  - Tambin puede pasar por nuestra oficina durante el horario de atencin regular y Education officer, museum una tarjeta de cupones de GoodRx.  - Si necesita que su receta se enve electrnicamente a una farmacia diferente, informe a nuestra oficina a travs de MyChart de [place] o por telfono llamando al [phone] y presione la opcin 4.

## 2023-10-09 ENCOUNTER — Encounter: Payer: Self-pay | Admitting: Endocrinology

## 2023-10-09 ENCOUNTER — Ambulatory Visit (INDEPENDENT_AMBULATORY_CARE_PROVIDER_SITE_OTHER)

## 2023-10-09 VITALS — Ht 66.0 in | Wt 135.4 lb

## 2023-10-09 DIAGNOSIS — Z0000 Encounter for general adult medical examination without abnormal findings: Secondary | ICD-10-CM | POA: Diagnosis not present

## 2023-10-09 NOTE — Patient Instructions (Signed)
 Ms. Ringel,  Thank you for taking the time for your Medicare Wellness Visit. I appreciate your continued commitment to your health goals. Please review the care plan we discussed, and feel free to reach out if I can assist you further.  Medicare recommends these wellness visits once per year to help you and your care team stay ahead of potential health issues. These visits are designed to focus on prevention, allowing your provider to concentrate on managing your acute and chronic conditions during your regular appointments.  Please note that Annual Wellness Visits do not include a physical exam. Some assessments may be limited, especially if the visit was conducted virtually. If needed, we may recommend a separate in-person follow-up with your provider.  Ongoing Care Seeing your primary care provider every 3 to 6 months helps us  monitor your health and provide consistent, personalized care.   Referrals If a referral was made during today's visit and you haven't received any updates within two weeks, please contact the referred provider directly to check on the status.  Recommended Screenings:  Health Maintenance  Topic Date Due   Zoster (Shingles) Vaccine (1 of 2) Never done   Pneumococcal Vaccine for age over 49 (2 of 2 - PCV) 10/15/2017   Eye exam for diabetics  01/23/2021   Flu Shot  04/13/2024*   DTaP/Tdap/Td vaccine (3 - Td or Tdap) 05/11/2024*   COVID-19 Vaccine (3 - Pfizer risk series) 09/08/2028*   Complete foot exam   12/31/2023   Hemoglobin A1C  01/09/2024   Yearly kidney health urinalysis for diabetes  04/06/2024   Colon Cancer Screening  04/18/2024   Yearly kidney function blood test for diabetes  09/15/2024   Medicare Annual Wellness Visit  10/08/2024   Pap with HPV screening  05/11/2028   DEXA scan (bone density measurement)  Completed   Hepatitis C Screening  Completed   HIV Screening  Completed   Hepatitis B Vaccine  Aged Out   HPV Vaccine  Aged Out   Meningitis B  Vaccine  Aged Out  *Topic was postponed. The date shown is not the original due date.       10/09/2023    1:27 PM  Advanced Directives  Does Patient Have a Medical Advance Directive? No  Would patient like information on creating a medical advance directive? No - Patient declined   Advance Care Planning is important because it: Ensures you receive medical care that aligns with your values, goals, and preferences. Provides guidance to your family and loved ones, reducing the emotional burden of decision-making during critical moments.  Vision: Annual vision screenings are recommended for early detection of glaucoma, cataracts, and diabetic retinopathy. These exams can also reveal signs of chronic conditions such as diabetes and high blood pressure.  Dental: Annual dental screenings help detect early signs of oral cancer, gum disease, and other conditions linked to overall health, including heart disease and diabetes.  Please see the attached documents for additional preventive care recommendations.

## 2023-10-09 NOTE — Progress Notes (Signed)
 Because this visit was a virtual/telehealth visit,  certain criteria was not obtained, such a blood pressure, CBG if applicable, and timed get up and go. Any medications not marked as taking were not mentioned during the medication reconciliation part of the visit. Any vitals not documented were not able to be obtained due to this being a telehealth visit or patient was unable to self-report a recent blood pressure reading due to a lack of equipment at home via telehealth. Vitals that have been documented are verbally provided by the patient.  This visit was performed by a medical professional under my direct supervision. I was immediately available for consultation/collaboration. I have reviewed and agree with the Annual Wellness Visit documentation.  Subjective:   Lindsay Fry is a 62 y.o. who presents for a Medicare Wellness preventive visit.  As a reminder, Annual Wellness Visits don't include a physical exam, and some assessments may be limited, especially if this visit is performed virtually. We may recommend an in-person follow-up visit with your provider if needed.  Visit Complete: Virtual I connected with  Lindsay Fry on 10/09/23 by a video and audio enabled telemedicine application and verified that I am speaking with the correct person using two identifiers.  Patient Location: Home  Provider Location: Home Office  I discussed the limitations of evaluation and management by telemedicine. The patient expressed understanding and agreed to proceed.  Vital Signs: Because this visit was a virtual/telehealth visit, some criteria may be missing or patient reported. Any vitals not documented were not able to be obtained and vitals that have been documented are patient reported.  Persons Participating in Visit: Patient.  AWV Questionnaire: No: Patient Medicare AWV questionnaire was not completed prior to this visit.  Cardiac Risk Factors include: advanced age (>76men, >56  women);hypertension;diabetes mellitus;dyslipidemia     Objective:    Today's Vitals   10/09/23 1330  Weight: 135 lb 6.4 oz (61.4 kg)  Height: 5' 6 (1.676 m)   Body mass index is 21.85 kg/m.     10/09/2023    1:27 PM 09/16/2023    1:32 PM 09/16/2023    1:25 PM 08/08/2023    5:41 PM 05/24/2022    1:34 PM 02/22/2022    4:33 PM 05/23/2021    1:38 PM  Advanced Directives  Does Patient Have a Medical Advance Directive? No No No No No No No  Would patient like information on creating a medical advance directive? No - Patient declined  No - Patient declined  No - Patient declined  No - Patient declined    Current Medications (verified) Outpatient Encounter Medications as of 10/09/2023  Medication Sig   acetaminophen  (TYLENOL ) 650 MG CR tablet Take 1,300 mg by mouth 2 (two) times daily.   albuterol  (VENTOLIN  HFA) 108 (90 Base) MCG/ACT inhaler INHALE 1 PUFF INTO THE LUNGS EVERY 4 HOURS AS NEEDED FOR WHEEZING   amantadine  (SYMMETREL ) 100 MG capsule Take 100 mg by mouth 2 (two) times daily.   Ascorbic Acid  (VITAMIN C  PO) Take 1,000 mg by mouth daily.   aspirin  81 MG chewable tablet Chew 1 tablet (81 mg total) by mouth daily.   atorvastatin  (LIPITOR ) 40 MG tablet TAKE 1 TABLET BY MOUTH ONCE DAILY   B Complex Vitamins (VITAMIN B COMPLEX PO) Take 1 tablet by mouth every other day.   Blood Glucose Monitoring Suppl (ONETOUCH VERIO REFLECT) w/Device KIT Use to check blood sugar 3 times a day   Calcium  Carb-Cholecalciferol  (CALTRATE 600+D3 PO) Take 1  tablet by mouth daily. 300 mg Vitamin D3   cyclobenzaprine  (FLEXERIL ) 5 MG tablet TAKE 1 TABLET BY MOUTH EVERY 8 HOURS AS NEEDED FOR MUSCLE SPASMS (BACK PAIN)   diazepam  (VALIUM ) 10 MG tablet Take 2.5-7.5 mg by mouth daily as needed for anxiety.   docusate sodium  (COLACE) 100 MG capsule Take 100-300 mg by mouth daily as needed for mild constipation or moderate constipation.   empagliflozin  (JARDIANCE ) 25 MG TABS tablet Take 1 tablet (25 mg total) by mouth  daily.   Evolocumab  (REPATHA  SURECLICK) 140 MG/ML SOAJ INJECT 140MG  SUBCUTANEOUSLY EVERY 2 WEEKS   famotidine  (PEPCID ) 40 MG tablet Take 40 mg by mouth 2 (two) times daily. 30 min. Before meal and at bedtime   fluticasone  (FLONASE ) 50 MCG/ACT nasal spray Place 1 spray into both nostrils daily.   gabapentin  (NEURONTIN ) 300 MG capsule TAKE 1 CAPSULE BY MOUTH 3 TIMES DAILY   glucose blood (ONETOUCH VERIO) test strip Use to check blood sugar 3 times a day   ketoconazole  (NIZORAL ) 2 % cream Apply to affected areas one to two times daily as needed. For seborrheic dermatitis at face   lamoTRIgine  (LAMICTAL ) 200 MG tablet Take 400 mg by mouth daily.   Lancets (ONETOUCH DELICA PLUS LANCET33G) MISC Use to check blood sugar 3 times a day.   latanoprost  (XALATAN ) 0.005 % ophthalmic solution Place 1 drop into both eyes 2 (two) times a week.    levothyroxine  (LEVOXYL ) 200 MCG tablet Take 1 tablet (200 mcg total) by mouth daily before breakfast.   loperamide  (IMODIUM  A-D) 2 MG tablet Take 2 mg by mouth daily as needed for diarrhea or loose stools.   Melatonin 10 MG CAPS Take 10 mg by mouth at bedtime.   mesalamine  (LIALDA ) 1.2 g EC tablet Take 2 tablets (2.4 g total) by mouth daily. Office visit for further refills   Meth-Hyo-M Bl-Na Phos-Ph Sal (URO-MP ) 118 MG CAPS Take one pill by mouth four times daily as needed for urinary discomfort   metoprolol  tartrate (LOPRESSOR ) 25 MG tablet Take 1 tablet (25 mg total) by mouth 2 (two) times daily as needed (for tachycardia).   Multiple Vitamin (MULTIVITAMIN) tablet Take 1 tablet by mouth daily. Woman 50 plus   nitroGLYCERIN  (NITROSTAT ) 0.4 MG SL tablet Place 1 tablet (0.4 mg total) under the tongue every 5 (five) minutes as needed for chest pain.   ondansetron  (ZOFRAN -ODT) 4 MG disintegrating tablet DISSOLVE 1 TABLET ON THE TONGUE EVERY 6 HOURS AS NEEDED FOR NAUSEA OR VOMIT   pantoprazole  (PROTONIX ) 40 MG tablet TAKE 1 TABLET BY MOUTH TWICE DAILY   polyethylene glycol  (MIRALAX  / GLYCOLAX ) 17 g packet Take 17 g by mouth 3 (three) times daily as needed for moderate constipation, mild constipation or severe constipation. In water  or beverage   Probiotic Product (CULTURELLE PROBIOTICS PO) Take 1 capsule by mouth daily. For woman   QUEtiapine  (SEROQUEL  XR) 400 MG 24 hr tablet Take 800 mg by mouth at bedtime.   simethicone  (MYLICON) 80 MG chewable tablet Chew 80 mg by mouth 4 (four) times daily.   tirzepatide  (MOUNJARO ) 15 MG/0.5ML Pen Inject 15 mg into the skin once a week.   valACYclovir  (VALTREX ) 1000 MG tablet Take 0.5 tablets (500 mg total) by mouth daily.   VRAYLAR  capsule Take 3 mg by mouth at bedtime.   zaleplon (SONATA) 10 MG capsule Take 10-20 mg by mouth at bedtime.   dexmethylphenidate (FOCALIN) 10 MG tablet Take 10 mg by mouth 3 (three) times daily. (Patient  not taking: Reported on 10/09/2023)   dextroamphetamine  (DEXEDRINE  SPANSULE) 15 MG 24 hr capsule Take 5 mg by mouth daily as needed. (Patient not taking: Reported on 10/09/2023)   No facility-administered encounter medications on file as of 10/09/2023.    Allergies (verified) Ephedrine, Oxycodone , Aspirin , Erythromycin, Nsaids, and Rosuvastatin   History: Past Medical History:  Diagnosis Date   ADD (attention deficit disorder) 11/19/2012   Allergic rhinitis, cause unspecified    Anxiety state, unspecified    Arthritis    Asymptomatic postmenopausal status (age-related) (natural)    Atypical hyperplasia of left breast 2000   Atypical mole 03/16/2018   R paraspinal mid upper back, excision   Atypical mole 02/17/2018   R spinal upper back, moderate   Atypical mole 08/11/2017   L sternum, excision   Atypical mole 05/27/2017   L post neck, moderate   Benign neoplasm of other and unspecified site of the digestive system    Bipolar disorder (HCC)    Breast cancer (HCC)    Left Breast 6mm invasive CA left uiq, dcis left uoq and a lot ADH, ALH in both breasts.   CAD (coronary artery disease)     a. 08/2014 NSTEMI/Cath: LM nl, LAD 50p, D1/D2 min irregs, LCX nl, OM1 40, LPDA nl, RI 99ost (2.49mm vessel->med Rx), RCA nl, AM 60, nl EF; b. 11/2016 MV: EF 74%, no ischemia (performed 2/2 dyspnea & new inf TWI).   Carcinoma of left breast (HCC) 06/02/2014   Cataract    Chronic diastolic CHF (congestive heart failure) (HCC)    a. 08/2014 Echo: EF 60-65%, Gr 1 DD, nl LA.   Clotting disorder    on blood thiner, Plavix    Cyst    on Achilles tendon   Depression    Diabetes mellitus (HCC)    frank   Dysuria    Edema    Family history of malignant neoplasm of gastrointestinal tract    Fibromyalgia    GERD (gastroesophageal reflux disease)    Glaucoma    pre glaucoma, 05/14/2018 pt. denies glaucoma   Headache    migraines   Hiatal hernia    History of alcoholism (HCC)    History of migraines    History of ovarian cyst    Hx of adenomatous colonic polyps    Insomnia, unspecified    Myocardial infarction (HCC) 09-04-14   OCD (obsessive compulsive disorder)    Osteopenia 09/25/2016   Femoral neck T -1.1  9/18   Other screening mammogram    Pure hypercholesterolemia    Rosacea    Subacute confusional state 11/19/2012   Tubular adenoma of colon 01/10/11   Ulcerative colitis, unspecified    Unspecified asthma(493.90)    Unspecified essential hypertension    Unspecified hypothyroidism    Unspecified vitamin D  deficiency    Vertigo    Past Surgical History:  Procedure Laterality Date   APPENDECTOMY     AXILLARY SENTINEL NODE BIOPSY Left 05/23/2014   Procedure: AXILLARY SENTINEL NODE BIOPSY;  Surgeon: Louanne KANDICE Muse, MD;  Location: [facility];  Service: General;  Laterality: Left;   BREAST BIOPSY  08/1998   on tamoxifen, atypical hyperplasia   BREAST BIOPSY Left 04/2014   BREAST SURGERY Left 08/1998   lumpectomy/ Dr Rollene   BREAST SURGERY Bilateral 05/23/2014   Mastectomy   BUNIONECTOMY Right    CARDIAC CATHETERIZATION N/A 09/05/2014   Procedure: Left Heart Cath and  Coronary Angiography;  Surgeon: Deatrice DELENA Cage, MD;  Location: [facility];  Service: Cardiovascular;  Laterality: N/A;   CARDIAC CATHETERIZATION  09/05/2014   CATARACT EXTRACTION W/ INTRAOCULAR LENS IMPLANT Bilateral    CESAREAN SECTION  1996/1997   placenta previa, gest DM, pre-eclampsia   CHOLECYSTECTOMY  1997   adhesions also   COLONOSCOPY  01/2001   Ulcerative colitis   COLONOSCOPY  04/2004   UC, polyp   COLONOSCOPY  12/2006   UC, no polyps   CORONARY STENT INTERVENTION N/A 04/06/2020   Procedure: CORONARY STENT INTERVENTION;  Surgeon: Mady Bruckner, MD;  Location: [facility];  Service: Cardiovascular;  Laterality: N/A;   DEXA  04/1999 and 2010   normal   ESOPHAGOGASTRODUODENOSCOPY  09/2001   polyp   exercise stress test  11/2003   negative   FEMUR FRACTURE SURGERY Left    LEFT HEART CATH AND CORONARY ANGIOGRAPHY N/A 04/06/2020   Procedure: LEFT HEART CATH AND CORONARY ANGIOGRAPHY;  Surgeon: Perla Evalene PARAS, MD;  Location: [facility];  Service: Cardiovascular;  Laterality: N/A;   MASTECTOMY Bilateral    MECKEL DIVERTICULUM EXCISION  1999   NASAL SINUS SURGERY  05/1997   nuclear stress test  06/2007   negative   precancerous mole removed     RIGHT AND LEFT HEART CATH     with 1 stent placed in March 2022   SIMPLE MASTECTOMY WITH AXILLARY SENTINEL NODE BIOPSY Bilateral 05/23/2014   Procedure: Bilateral simple mastectomy, left sentinel node biopsy ;  Surgeon: Louanne KANDICE Muse, MD;  Location: [facility];  Service: General;  Laterality: Bilateral;   Sleep study  11/2007   no apnea, but did snore (done by [facility])   TONSILLECTOMY  1984   TUBAL LIGATION     WISDOM TOOTH EXTRACTION  1980's   Family History  Problem Relation Age of Onset   Coronary artery disease Father    Colon cancer Father    Alzheimer's disease Father    Dementia Father    Colon polyps Father    Coronary artery disease Mother    Hypertension Mother     Osteoporosis Mother    Crohn's disease Mother        crohns colitis   Breast cancer Other        great aunts   Coronary artery disease Other        Uncle (also AAA)   Diabetes Other        remote family history   Stroke Cousin    Esophageal cancer Neg Hx    Rectal cancer Neg Hx    Stomach cancer Neg Hx    Social History   Socioeconomic History   Marital status: Married    Spouse name: alan   Number of children: 2   Years of education: college   Highest education level: Bachelor's degree (e.g., BA, AB, BS)  Occupational History   Occupation: unemployed  Tobacco Use   Smoking status: Former    Current packs/day: 0.00    Average packs/day: 0.3 packs/day for 5.0 years (1.3 ttl pk-yrs)    Types: Cigarettes    Start date: 01/15/1988    Quit date: 01/14/1993    Years since quitting: 30.7   Smokeless tobacco: Never  Vaping Use   Vaping status: Never Used  Substance and Sexual Activity   Alcohol use: No    Alcohol/week: 0.0 standard drinks of alcohol    Comment: Recovered ETOH   Drug use: No   Sexual activity: Not Currently    Partners: Male  Other Topics  Concern   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      [employer] telephone triage   Social Drivers of Health   Financial Resource Strain: Low Risk  (10/09/2023)   Overall Financial Resource Strain (CARDIA)    Difficulty of Paying Living Expenses: Not very hard  Food Insecurity: No Food Insecurity (10/09/2023)   Hunger Vital Sign    Worried About Running Out of Food in the Last Year: Never true    Ran Out of Food in the Last Year: Never true  Transportation Needs: No Transportation Needs (10/09/2023)   PRAPARE - Administrator, Civil Service (Medical): No    Lack of Transportation (Non-Medical): No  Physical Activity: Sufficiently Active (10/09/2023)   Exercise Vital Sign    Days of Exercise per Week: 3 days     Minutes of Exercise per Session: 60 min  Stress: No Stress Concern Present (10/09/2023)   [employer] of Occupational Health - Occupational Stress Questionnaire    Feeling of Stress: Only a little  Social Connections: Socially Integrated (10/09/2023)   Social Connection and Isolation Panel    Frequency of Communication with Friends and Family: More than three times a week    Frequency of Social Gatherings with Friends and Family: More than three times a week    Attends Religious Services: More than 4 times per year    Active Member of [employer] or Organizations: Yes    Attends Engineer, structural: More than 4 times per year    Marital Status: Married    Tobacco Counseling Counseling given: Not Answered    Clinical Intake:  Pre-visit preparation completed: Yes  Pain : No/denies pain     BMI - recorded: 21.85 Nutritional Status: BMI of 19-24  Normal Nutritional Risks: None Diabetes: No  Lab Results  Component Value Date   HGBA1C 4.9 07/10/2023   HGBA1C 4.8 04/07/2023   HGBA1C 5.2 12/20/2022     How often do you need to have someone help you when you read instructions, pamphlets, or other written materials from your doctor or pharmacy?: 1 - Never  Interpreter Needed?: No  Information entered by :: Jaliya Siegmann,CMA   Activities of Daily Living     10/09/2023    1:36 PM  In your present state of health, do you have any difficulty performing the following activities:  Hearing? 0  Vision? 0  Difficulty concentrating or making decisions? 0  Walking or climbing stairs? 0  Dressing or bathing? 0  Doing errands, shopping? 0  Preparing Food and eating ? N  Using the Toilet? N  In the past six months, have you accidently leaked urine? N  Do you have problems with loss of bowel control? N  Managing your Medications? N  Managing your Finances? N  Housekeeping or managing your Housekeeping? N    Patient Care Team: Tower, Laine LABOR, MD as PCP - General  (Family Medicine) Vincente Grip, MD as Consulting Physician (Psychiatry) Perla Evalene PARAS, MD as Consulting Physician (Cardiology) Cindie Ole DASEN, MD as Consulting Physician (Cardiology) Rennie Cindy SAUNDERS, MD as Consulting Physician (Oncology)  I have updated your Care Teams any recent Medical Services you may have received from other providers in the past year.     Assessment:   This is a routine wellness examination  for Lindsay ([place]).  Hearing/Vision screen Hearing Screening - Comments:: No difficulties  Vision Screening - Comments:: Patient wears readers    Goals Addressed             This Visit's Progress    Patient Stated       To work on improving her strength       Depression Screen     10/09/2023    1:39 PM 09/16/2023    1:35 PM 08/18/2023   11:18 AM 05/13/2023   11:13 AM 04/25/2023    3:12 PM 04/10/2023    2:09 PM 08/13/2022   11:35 AM  PHQ 2/9 Scores  PHQ - 2 Score 0 0 0 0 0 0 0  PHQ- 9 Score 1  0 0 0 0 2    Fall Risk     10/09/2023    1:36 PM 08/18/2023   11:18 AM 05/13/2023   11:13 AM 04/25/2023    3:12 PM 08/21/2022   11:32 AM  Fall Risk   Falls in the past year? 0 0 0 0 0  Number falls in past yr: 0 0 0 0 0  Injury with Fall? 0 0 0 0 0  Risk for fall due to : No Fall Risks No Fall Risks No Fall Risks No Fall Risks No Fall Risks  Follow up Falls evaluation completed Falls evaluation completed Falls evaluation completed Falls evaluation completed Falls prevention discussed;Falls evaluation completed    MEDICARE RISK AT HOME:  Medicare Risk at Home Any stairs in or around the home?: Yes If so, are there any without handrails?: Yes Home free of loose throw rugs in walkways, pet beds, electrical cords, etc?: Yes Adequate lighting in your home to reduce risk of falls?: Yes Life alert?: No Use of a cane, walker or w/c?: No Grab bars in the bathroom?: Yes Shower chair or bench in shower?: Yes Elevated toilet seat or a handicapped toilet?: Yes  TIMED  UP AND GO:  Was the test performed?  No  Cognitive Function: 6CIT completed    08/30/2020    7:48 AM 08/05/2019    1:47 PM 02/05/2019   12:28 PM  MMSE - Mini Mental State Exam  Not completed:   --  Orientation to time 5 5 5   Orientation to Place 5 5 5   Registration 3 3 3   Attention/ Calculation 5 5 3   Recall 3 3 2   Language- name 2 objects 2 2 2   Language- repeat 1 1 1   Language- follow 3 step command 3 3 3   Language- read & follow direction 1 1 1   Write a sentence 1 1 1   Copy design 0 0 1  Total score 29 29 27         10/09/2023    1:42 PM  6CIT Screen  What Year? 0 points  What month? 0 points  What time? 0 points  Count back from 20 0 points  Months in reverse 0 points  Repeat phrase 0 points  Total Score 0 points    Immunizations Immunization History  Administered Date(s) Administered   Influenza Split 12/05/2010, 10/23/2011   Influenza Whole 01/15/2004, 10/15/2006, 10/20/2007, 10/13/2009   Influenza,inj,Quad PF,6+ Mos 11/18/2012, 10/19/2013, 11/10/2015, 10/15/2016, 11/18/2017   PFIZER(Purple Top)SARS-COV-2 Vaccination 06/18/2019, 07/09/2019   Pneumococcal Polysaccharide-23 06/18/2007, 10/15/2016   Td 05/15/2001   Tdap 12/05/2010    Screening Tests Health Maintenance  Topic Date Due   Zoster Vaccines- Shingrix (1 of 2) Never done   Pneumococcal Vaccine: 50+ Years (  2 of 2 - PCV) 10/15/2017   OPHTHALMOLOGY EXAM  01/23/2021   Influenza Vaccine  04/13/2024 (Originally 08/15/2023)   DTaP/Tdap/Td (3 - Td or Tdap) 05/11/2024 (Originally 12/04/2020)   COVID-19 Vaccine (3 - Pfizer risk series) 09/08/2028 (Originally 08/06/2019)   FOOT EXAM  12/31/2023   HEMOGLOBIN A1C  01/09/2024   Diabetic kidney evaluation - Urine ACR  04/06/2024   Colonoscopy  04/18/2024   Diabetic kidney evaluation - eGFR measurement  09/15/2024   Medicare Annual Wellness (AWV)  10/08/2024   Cervical Cancer Screening (HPV/Pap Cotest)  05/11/2028   DEXA SCAN  Completed   Hepatitis C Screening   Completed   HIV Screening  Completed   Hepatitis B Vaccines 19-59 Average Risk  Aged Out   HPV VACCINES  Aged Out   Meningococcal B Vaccine  Aged Out    Health Maintenance Items Addressed:patient declined   Additional Screening:  Vision Screening: Recommended annual ophthalmology exams for early detection of glaucoma and other disorders of the eye. Is the patient up to date with their annual eye exam?  No  Who is the provider or what is the name of the office in which the patient attends annual eye exams?   Dental Screening: Recommended annual dental exams for proper oral hygiene  Community Resource Referral / Chronic Care Management: CRR required this visit?  No   CCM required this visit?  No   Plan:    I have personally reviewed and noted the following in the patient's chart:   Medical and social history Use of alcohol, tobacco or illicit drugs  Current medications and supplements including opioid prescriptions. Patient is not currently taking opioid prescriptions. Functional ability and status Nutritional status Physical activity Advanced directives List of other physicians Hospitalizations, surgeries, and ER visits in previous 12 months Vitals Screenings to include cognitive, depression, and falls Referrals and appointments  In addition, I have reviewed and discussed with patient certain preventive protocols, quality metrics, and best practice recommendations. A written personalized care plan for preventive services as well as general preventive health recommendations were provided to patient.   Lyle MARLA Right, [place]   10/09/2023   After Visit Summary: (MyChart) Due to this being a telephonic visit, the after visit summary with patients personalized plan was offered to patient via MyChart   Notes: Nothing significant to report at this time.

## 2023-10-10 ENCOUNTER — Other Ambulatory Visit: Payer: Self-pay

## 2023-10-10 DIAGNOSIS — E890 Postprocedural hypothyroidism: Secondary | ICD-10-CM

## 2023-10-10 DIAGNOSIS — F411 Generalized anxiety disorder: Secondary | ICD-10-CM | POA: Diagnosis not present

## 2023-10-10 DIAGNOSIS — F3173 Bipolar disorder, in partial remission, most recent episode manic: Secondary | ICD-10-CM | POA: Diagnosis not present

## 2023-10-10 DIAGNOSIS — F3112 Bipolar disorder, current episode manic without psychotic features, moderate: Secondary | ICD-10-CM | POA: Diagnosis not present

## 2023-10-10 DIAGNOSIS — F901 Attention-deficit hyperactivity disorder, predominantly hyperactive type: Secondary | ICD-10-CM | POA: Diagnosis not present

## 2023-10-10 MED ORDER — LEVOTHYROXINE SODIUM 200 MCG PO TABS: 200.0000 ug | ORAL_TABLET | Freq: Every day | ORAL | 3 refills | Status: AC

## 2023-10-15 ENCOUNTER — Encounter: Payer: Self-pay | Admitting: Nurse Practitioner

## 2023-10-15 ENCOUNTER — Ambulatory Visit: Admitting: Nurse Practitioner

## 2023-10-15 VITALS — BP 130/90 | HR 108 | Ht 64.75 in | Wt 137.4 lb

## 2023-10-15 DIAGNOSIS — K5900 Constipation, unspecified: Secondary | ICD-10-CM | POA: Diagnosis not present

## 2023-10-15 DIAGNOSIS — K219 Gastro-esophageal reflux disease without esophagitis: Secondary | ICD-10-CM

## 2023-10-15 DIAGNOSIS — Z860101 Personal history of adenomatous and serrated colon polyps: Secondary | ICD-10-CM | POA: Diagnosis not present

## 2023-10-15 NOTE — Progress Notes (Signed)
 10/15/2023 Lindsay Fry [id number] 08/14/1960   Chief Complaint: Constipation   History of Present Illness: Lindsay Fry is a 63 year old female with a past medical history of arthritis, fibromyalgia, hypertension, coronary artery disease s/p NSTEMI 08/2014 and s/p stent placement 03/2020 (on ASA 81 mg daily, no longer on Plavix ), diabetes mellitus type 2, breast cancer, bipolar disorder, OCD, chronic GERD, chronic constipation, colon polyps and ulcerative colitis.  She is known by Dr. Abran.  She endorses having constipation, passing a BM every 4 to 5 days.  She describes passing golf ball sized stools without rectal bleeding.  No abdominal pain.  No bloody or black stools.  No diarrhea.  She typically takes MiraLAX  every 2 days, a stool softener twice daily and a probiotic once daily.  She drinks 2 cups of coffee daily.  She was last on antibiotic 07/2023 for possible UTI and there was consideration for possible interstitial cystitis.  Most recent colonoscopy was 04/18/2021 which showed a redundant colon, melanosis coli and 1 tubular adenomatous polyp was removed from the ascending colon.  No evidence of active colitis.  She was advised to repeat a colonoscopy in 3 years.  She remains on Mesalamine  1.2 g 2 tabs p.o. daily.  GERD is well-controlled on Pantoprazole  40 mg once daily.     Latest Ref Rng & Units 09/16/2023    1:26 PM 05/09/2023    1:26 PM 04/10/2023    2:55 PM  CBC  WBC 4.0 - 10.5 K/uL 5.4  9.6  8.1   Hemoglobin 12.0 - 15.0 g/dL 85.3  85.0  84.2   Hematocrit 36.0 - 46.0 % 42.9  45.7  47.0   Platelets 150 - 400 K/uL 248  362.0  355.0        Latest Ref Rng & Units 09/16/2023    1:26 PM 05/09/2023    1:26 PM 04/10/2023    2:55 PM  CMP  Glucose 70 - 99 mg/dL 866  896  86   BUN 8 - 23 mg/dL 15  9  11    Creatinine 0.44 - 1.00 mg/dL 9.23  9.28  9.06   Sodium 135 - 145 mmol/L 135  144  142   Potassium 3.5 - 5.1 mmol/L 3.5  3.9  4.3   Chloride 98 - 111 mmol/L 103  107  105    CO2 22 - 32 mmol/L 24  29  27    Calcium  8.9 - 10.3 mg/dL 9.1  8.9  9.7   Total Protein 6.5 - 8.1 g/dL 6.5  5.8  7.0   Total Bilirubin 0.0 - 1.2 mg/dL 0.7  0.4  0.4   Alkaline Phos 38 - 126 U/L 64  77  97   AST 15 - 41 U/L 36  17  22   ALT 0 - 44 U/L 30  16  15      MOST RECENT GI PROCEDURES:  Colonoscopy 04/18/2021: - One 3 mm polyp in the ascending colon, removed with a cold snare. Resected and retrieved.  - Melanosis coli  - The entire examined colon is normal on direct and retroflexion views. No obvious active colitis. - Redundant colon - Recall colonoscopy 3 years   1. Surgical [P], right colon bxs (hx UC R/O dysplasia, surveillance) MILD NONSPECIFIC INFLAMMATION CONSISTENT WITH PREP RELATED CHANGES. THERE ARE NO DIAGNOSTIC FEATURES OF INFLAMMATORY BOWEL DISEASE IN ACTIVE PHASE, MICROSCOPIC COLITIS AND COLLAGENOUS COLITIS. NEGATIVE FOR DYSPLASIA AND MALIGNANCY. 2. Surgical [P], colon, ascending, polyp (1) TUBULAR ADENOMA.  NEGATIVE FOR HIGH-GRADE DYSPLASIA. 3. Surgical [P], transverse colon bxs hx UC, surveillance bxs MILD NONSPECIFIC INFLAMMATION CONSISTENT WITH PREP RELATED CHANGES. THERE ARE NO DIAGNOSTIC FEATURES OF INFLAMMATORY BOWEL DISEASE IN ACTIVE PHASE, MICROSCOPIC COLITIS AND COLLAGENOUS COLITIS. NEGATIVE FOR DYSPLASIA AND MALIGNANCY 4. Surgical [P], left colon bxs (hx ulcerative colitis) surveillance bxs MILD NONSPECIFIC INFLAMMATION CONSISTENT WITH PREP RELATED CHANGES. THERE ARE NO DIAGNOSTIC FEATURES OF INFLAMMATORY BOWEL DISEASE IN ACTIVE PHASE, MICROSCOPIC COLITIS AND COLLAGENOUS COLITIS. NEGATIVE FOR DYSPLASIA AND MALIGNANCY. 5. Surgical [P], colon, recto-sigmoid bxs FRAGMENTS OF RECTAL MUCOSA WITH NO SIGNIFICANT MICROSCOPIC ABNORMALITIES. NEGATIVE FOR INFLAMMATORY BOWEL DISEASE AND NEOPLASM.  Colonoscopy 05/14/2018: 2 diminutive polyps at that time. Exam was otherwise normal. Random colon biopsies were unremarkable. The small polyp was tubular adenoma.    Past Medical History:  Diagnosis Date   ADD (attention deficit disorder) 11/19/2012   Allergic rhinitis, cause unspecified    Anxiety state, unspecified    Arthritis    Asymptomatic postmenopausal status (age-related) (natural)    Atypical hyperplasia of left breast 2000   Atypical mole 03/16/2018   R paraspinal mid upper back, excision   Atypical mole 02/17/2018   R spinal upper back, moderate   Atypical mole 08/11/2017   L sternum, excision   Atypical mole 05/27/2017   L post neck, moderate   Benign neoplasm of other and unspecified site of the digestive system    Bipolar disorder (HCC)    Breast cancer (HCC)    Left Breast 6mm invasive CA left uiq, dcis left uoq and a lot ADH, ALH in both breasts.   CAD (coronary artery disease)    a. 08/2014 NSTEMI/Cath: LM nl, LAD 50p, D1/D2 min irregs, LCX nl, OM1 40, LPDA nl, RI 99ost (2.85mm vessel->med Rx), RCA nl, AM 60, nl EF; b. 11/2016 MV: EF 74%, no ischemia (performed 2/2 dyspnea & new inf TWI).   Carcinoma of left breast (HCC) 06/02/2014   Cataract    Chronic diastolic CHF (congestive heart failure) (HCC)    a. 08/2014 Echo: EF 60-65%, Gr 1 DD, nl LA.   Clotting disorder    on blood thiner, Plavix    Cyst    on Achilles tendon   Depression    Diabetes mellitus (HCC)    frank   Dysuria    Edema    Family history of malignant neoplasm of gastrointestinal tract    Fibromyalgia    GERD (gastroesophageal reflux disease)    Glaucoma    pre glaucoma, 05/14/2018 pt. denies glaucoma   Headache    migraines   Hiatal hernia    History of alcoholism (HCC)    History of migraines    History of ovarian cyst    Hx of adenomatous colonic polyps    Insomnia, unspecified    Myocardial infarction (HCC) 09-04-14   OCD (obsessive compulsive disorder)    Osteopenia 09/25/2016   Femoral neck T -1.1  9/18   Other screening mammogram    Pure hypercholesterolemia    Rosacea    Subacute confusional state 11/19/2012   Tubular adenoma of colon  01/10/11   Ulcerative colitis, unspecified    Unspecified asthma(493.90)    Unspecified essential hypertension    Unspecified hypothyroidism    Unspecified vitamin D  deficiency    Vertigo    Past Surgical History:  Procedure Laterality Date   APPENDECTOMY     AXILLARY SENTINEL NODE BIOPSY Left 05/23/2014   Procedure: AXILLARY SENTINEL NODE BIOPSY;  Surgeon: Louanne KANDICE Muse, MD;  Location:  [facility];  Service: General;  Laterality: Left;   BREAST BIOPSY  08/1998   on tamoxifen, atypical hyperplasia   BREAST BIOPSY Left 04/2014   BREAST SURGERY Left 08/1998   lumpectomy/ Dr Rollene   BREAST SURGERY Bilateral 05/23/2014   Mastectomy   BUNIONECTOMY Right    CARDIAC CATHETERIZATION N/A 09/05/2014   Procedure: Left Heart Cath and Coronary Angiography;  Surgeon: Deatrice DELENA Cage, MD;  Location: [facility];  Service: Cardiovascular;  Laterality: N/A;   CARDIAC CATHETERIZATION  09/05/2014   CATARACT EXTRACTION W/ INTRAOCULAR LENS IMPLANT Bilateral    CESAREAN SECTION  1996/1997   placenta previa, gest DM, pre-eclampsia   CHOLECYSTECTOMY  1997   adhesions also   COLONOSCOPY  01/2001   Ulcerative colitis   COLONOSCOPY  04/2004   UC, polyp   COLONOSCOPY  12/2006   UC, no polyps   CORONARY STENT INTERVENTION N/A 04/06/2020   Procedure: CORONARY STENT INTERVENTION;  Surgeon: Mady Bruckner, MD;  Location: [facility];  Service: Cardiovascular;  Laterality: N/A;   DEXA  04/1999 and 2010   normal   ESOPHAGOGASTRODUODENOSCOPY  09/2001   polyp   exercise stress test  11/2003   negative   FEMUR FRACTURE SURGERY Left    LEFT HEART CATH AND CORONARY ANGIOGRAPHY N/A 04/06/2020   Procedure: LEFT HEART CATH AND CORONARY ANGIOGRAPHY;  Surgeon: Perla Evalene PARAS, MD;  Location: [facility];  Service: Cardiovascular;  Laterality: N/A;   MASTECTOMY Bilateral    MECKEL DIVERTICULUM EXCISION  1999   NASAL SINUS SURGERY  05/1997   nuclear stress test  06/2007    negative   precancerous mole removed     RIGHT AND LEFT HEART CATH     with 1 stent placed in March 2022   SIMPLE MASTECTOMY WITH AXILLARY SENTINEL NODE BIOPSY Bilateral 05/23/2014   Procedure: Bilateral simple mastectomy, left sentinel node biopsy ;  Surgeon: Louanne KANDICE Muse, MD;  Location: [facility];  Service: General;  Laterality: Bilateral;   Sleep study  11/2007   no apnea, but did snore (done by [facility])   TONSILLECTOMY  1984   TUBAL LIGATION     WISDOM TOOTH EXTRACTION  1980's   Current Outpatient Medications on File Prior to Visit  Medication Sig Dispense Refill   acetaminophen  (TYLENOL ) 650 MG CR tablet Take 1,300 mg by mouth 2 (two) times daily.     albuterol  (VENTOLIN  HFA) 108 (90 Base) MCG/ACT inhaler INHALE 1 PUFF INTO THE LUNGS EVERY 4 HOURS AS NEEDED FOR WHEEZING 1 each 3   amantadine  (SYMMETREL ) 100 MG capsule Take 100 mg by mouth 2 (two) times daily.     Ascorbic Acid  (VITAMIN C  PO) Take 1,000 mg by mouth daily.     aspirin  81 MG chewable tablet Chew 1 tablet (81 mg total) by mouth daily. 90 tablet 0   atorvastatin  (LIPITOR ) 40 MG tablet TAKE 1 TABLET BY MOUTH ONCE DAILY 90 tablet 3   B Complex Vitamins (VITAMIN B COMPLEX PO) Take 1 tablet by mouth every other day.     Blood Glucose Monitoring Suppl (ONETOUCH VERIO REFLECT) w/Device KIT Use to check blood sugar 3 times a day 1 kit 0   Calcium  Carb-Cholecalciferol  (CALTRATE 600+D3 PO) Take 1 tablet by mouth daily. 300 mg Vitamin D3     Cholecalciferol  (VITAMIN D3 PO) Take 1 tablet by mouth daily.     cyclobenzaprine  (FLEXERIL ) 5 MG tablet TAKE 1 TABLET BY MOUTH EVERY 8 HOURS AS  NEEDED FOR MUSCLE SPASMS (BACK PAIN) 30 tablet 1   dexmethylphenidate (FOCALIN) 10 MG tablet Take by mouth. Taking 20 mg at breakfast and 10 mg at noon     diazepam  (VALIUM ) 10 MG tablet Take 2.5-7.5 mg by mouth daily as needed for anxiety.     docusate sodium  (COLACE) 100 MG capsule Take 100-300 mg by mouth daily as needed for mild  constipation or moderate constipation.     empagliflozin  (JARDIANCE ) 25 MG TABS tablet Take 1 tablet (25 mg total) by mouth daily. 30 tablet 11   Evolocumab  (REPATHA  SURECLICK) 140 MG/ML SOAJ INJECT 140MG  SUBCUTANEOUSLY EVERY 2 WEEKS 6 mL 3   famotidine  (PEPCID ) 40 MG tablet Take 40 mg by mouth 2 (two) times daily. 30 min. Before meal and at bedtime     fluticasone  (FLONASE ) 50 MCG/ACT nasal spray Place 1 spray into both nostrils daily. 16 g 6   gabapentin  (NEURONTIN ) 300 MG capsule TAKE 1 CAPSULE BY MOUTH 3 TIMES DAILY 270 capsule 1   glucose blood (ONETOUCH VERIO) test strip Use to check blood sugar 3 times a day 300 each 12   ketoconazole  (NIZORAL ) 2 % cream Apply to affected areas one to two times daily as needed. For seborrheic dermatitis at face 30 g 11   lamoTRIgine  (LAMICTAL ) 200 MG tablet Take 400 mg by mouth daily.     Lancets (ONETOUCH DELICA PLUS LANCET33G) MISC Use to check blood sugar 3 times a day. 300 each 12   latanoprost  (XALATAN ) 0.005 % ophthalmic solution Place 1 drop into both eyes 2 (two) times a week.      levothyroxine  (LEVOXYL ) 200 MCG tablet Take 1 tablet (200 mcg total) by mouth daily before breakfast. 90 tablet 3   loperamide  (IMODIUM  A-D) 2 MG tablet Take 2 mg by mouth daily as needed for diarrhea or loose stools.     Melatonin 10 MG CAPS Take 10 mg by mouth at bedtime.     mesalamine  (LIALDA ) 1.2 g EC tablet Take 2 tablets (2.4 g total) by mouth daily. Office visit for further refills 180 tablet 0   Meth-Hyo-M Bl-Na Phos-Ph Sal (URO-MP ) 118 MG CAPS Take one pill by mouth four times daily as needed for urinary discomfort 60 capsule 0   metoprolol  tartrate (LOPRESSOR ) 25 MG tablet Take 1 tablet (25 mg total) by mouth 2 (two) times daily as needed (for tachycardia). 60 tablet 3   Multiple Vitamin (MULTIVITAMIN) tablet Take 1 tablet by mouth daily. Woman 50 plus     nitroGLYCERIN  (NITROSTAT ) 0.4 MG SL tablet Place 1 tablet (0.4 mg total) under the tongue every 5 (five)  minutes as needed for chest pain. 25 tablet 3   ondansetron  (ZOFRAN -ODT) 4 MG disintegrating tablet DISSOLVE 1 TABLET ON THE TONGUE EVERY 6 HOURS AS NEEDED FOR NAUSEA OR VOMIT 90 tablet 1   pantoprazole  (PROTONIX ) 40 MG tablet TAKE 1 TABLET BY MOUTH TWICE DAILY 180 tablet 1   polyethylene glycol (MIRALAX  / GLYCOLAX ) 17 g packet Take 17 g by mouth 3 (three) times daily as needed for moderate constipation, mild constipation or severe constipation. In water  or beverage     Probiotic Product (CULTURELLE PROBIOTICS PO) Take 1 capsule by mouth daily. For woman     QUEtiapine  (SEROQUEL  XR) 400 MG 24 hr tablet Take 800 mg by mouth at bedtime.     simethicone  (MYLICON) 80 MG chewable tablet Chew 80 mg by mouth 4 (four) times daily.     tirzepatide  (MOUNJARO ) 15 MG/0.5ML Pen  Inject 15 mg into the skin once a week. 6 mL 3   valACYclovir  (VALTREX ) 1000 MG tablet Take 0.5 tablets (500 mg total) by mouth daily. 45 tablet 1   VRAYLAR  capsule Take 3 mg by mouth at bedtime.     zaleplon (SONATA) 10 MG capsule Take 10-20 mg by mouth at bedtime.     No current facility-administered medications on file prior to visit.   Allergies  Allergen Reactions   Ephedrine Other (See Comments)    Pt becomes hyper   Oxycodone  Other (See Comments)    Panic Attack   Aspirin  Other (See Comments)    REACTION: aggrivates colitis Tolerates 81 mg but unable to take doses >81 mg   Erythromycin Other (See Comments)    REACTION: GI upset   Nsaids     REACTION: aggrivate colitis- GI ok'd for short term use   Rosuvastatin Other (See Comments)    REACTION: increased lfts   Current Medications, Allergies, Past Medical History, Past Surgical History, Family History and Social History were reviewed in [employer] record.  Review of Systems:   Constitutional: Negative for fever, sweats, chills or weight loss.  Respiratory: Negative for shortness of breath.   Cardiovascular: Negative for chest pain,  palpitations and leg swelling.  Gastrointestinal: See HPI.  Musculoskeletal: Negative for back pain or muscle aches.  Neurological: Negative for dizziness, headaches or paresthesias.   Physical Exam: BP (!) 150/100 (BP Location: Right Arm, Patient Position: Sitting, Cuff Size: Normal)   Pulse (!) 108   Ht 5' 4.75 (1.645 m) Comment: height measured without shoes  Wt 137 lb 6 oz (62.3 kg)   LMP 08/23/2006 (Approximate) Comment: tubal ligation  BMI 23.04 kg/m  LMP 08/23/2006 (Approximate) Comment: tubal ligation General: 63 year old female in no acute distress. Head: Normocephalic and atraumatic. Eyes: No scleral icterus. Conjunctiva pink . Ears: Normal auditory acuity. Mouth: Dentition intact. No ulcers or lesions.  Lungs: Clear throughout to auscultation. Heart: Regular rate and rhythm, no murmur. Abdomen: Soft, nontender and nondistended. No masses or hepatomegaly. Normal bowel sounds x 4 quadrants. Thin abdominal wall.  Rectal: Deferred.  Musculoskeletal: Symmetrical with no gross deformities. Extremities: No edema. Neurological: Alert oriented x 4. No focal deficits.  Psychological: Alert and cooperative. Normal mood and affect  Assessment and Recommendations:  63 year old female with a history of ulcerative colitis on oral Mesalamine  2.4 g p.o. daily who presents with constipation. - MiraLAX  nightly, may increase to twice daily - Drink 6 glasses of water  daily - Dietary fiber as tolerated - Next surveillance colonoscopy due 04/2024 - Patient to contact me in 3 to 4 weeks with an update  GERD - Continue Pantoprazole  40 mg once daily - GERD diet  History of colon polyps.  Colonoscopy 04/2021 identified 1 tubular adenomatous polyp removed from the ascending colon. - Next colon polyp surveillance colonoscopy due 04/2024  History of CAD, remote NSTEMI and status post stent placement 03/2020.  On ASA 81 mg daily, no longer on Plavix .  No angina.  Diabetes mellitus type  2  Today's encounter was 25 minutes which included precharting, chart/result review, history/exam, face-to-face time used for counseling, formulating a treatment plan with follow-up and documentation.

## 2023-10-15 NOTE — Progress Notes (Signed)
 Noted

## 2023-10-15 NOTE — Patient Instructions (Addendum)
 _______________________________________________________  If your blood pressure at your visit was 140/90 or greater, please contact your primary care physician to follow up on this.  _______________________________________________________  If you are age 65 or older, your body mass index should be between 23-30. Your Body mass index is 23.04 kg/m. If this is out of the aforementioned range listed, please consider follow up with your Primary Care Provider.  If you are age 44 or younger, your body mass index should be between 19-25. Your Body mass index is 23.04 kg/m. If this is out of the aformentioned range listed, please consider follow up with your Primary Care Provider.   ________________________________________________________  The [facility] providers would like to encourage you to use MYCHART to communicate with providers for non-urgent requests or questions.  Due to long hold times on the telephone, sending your provider a message by [facility] may be a faster and more efficient way to get a response.  Please allow 48 business hours for a response.  Please remember that this is for non-urgent requests.  _______________________________________________________  [facility] is using a team-based approach to care.  Your team is made up of your doctor and two to three APPS. Our APPS (Nurse Practitioners and Physician Assistants) work with your physician to ensure care continuity for you. They are fully qualified to address your health concerns and develop a treatment plan. They communicate directly with your gastroenterologist to care for you. Seeing the Advanced Practice Practitioners on your physician's team can help you by facilitating care more promptly, often allowing for earlier appointments, access to diagnostic testing, procedures, and other specialty referrals.   Please purchase the following medications over the counter and take as directed: Miralax  daily at bedtime but can  increase to 2 times daily as needed Benefiber 1 tablespoon daily as tolerated  Please call Elida Fend nurse in 3-4 weeks at [phone] with an update on how you are doing.  Repeat colonoscopy for 04-2024. Please call 2 months prior to schedule this. A letter will be sent as it gets closer.  Thank you for trusting me with your gastrointestinal care!   Elida Shawl, CRNP

## 2023-10-21 ENCOUNTER — Other Ambulatory Visit: Payer: Self-pay | Admitting: Internal Medicine

## 2023-10-22 ENCOUNTER — Ambulatory Visit: Payer: Self-pay

## 2023-10-22 NOTE — Telephone Encounter (Signed)
 FYI Only or Action Required?: FYI only for provider.  Patient was last seen in primary care on 08/18/2023 by Lindsay Laine LABOR, MD.  Called Nurse Triage reporting Hip Pain.  Symptoms began yesterday.  Interventions attempted: Nothing.  Symptoms are: gradually worsening.  Triage Disposition: See HCP Within 4 Hours (Or PCP Triage), Home Care- No appts avail. Pt will go to Urgent Care at  [facility] this evening  Patient/caregiver understands and will follow disposition?: Yes Copied from CRM #[id number]. Topic: Clinical - Red Word Triage >> Oct 22, 2023  3:14 PM Dedra B wrote: Red Word that prompted transfer to Nurse Triage: Pt having R side near hip bone, nausea, and vomiting. Warm transfer to NT. Reason for Disposition  Unexplained nausea  [1] MILD-MODERATE pain AND [2] constant AND [3] age > 60 years  Answer Assessment - Initial Assessment Questions 1. LOCATION: Where does it hurt?      RLQ  2. RADIATION: Does the pain shoot anywhere else? (e.g., chest, back)     No  3. ONSET: When did the pain begin? (e.g., minutes, hours or days ago)      Started last night  4. SUDDEN: Gradual or sudden onset?     Gradual  5. PATTERN Does the pain come and go, or is it constant?     Comes and goes  6. SEVERITY: How bad is the pain?  (e.g., Scale 1-10; mild, moderate, or severe)     Mild- moderate  7. RECURRENT SYMPTOM: Have you ever had this type of stomach pain before? If Yes, ask: When was the last time? and What happened that time?      Yes, has history of ulcerative colitis  8. CAUSE: What do you think is causing the stomach pain? (e.g., gallstones, recent abdominal surgery)     Unsure of cause of RLQ pain  9. RELIEVING/AGGRAVATING FACTORS: What makes it better or worse? (e.g., antacids, bending or twisting motion, bowel movement)     *No Answer* 10. OTHER SYMPTOMS: Do you have any other symptoms? (e.g., back pain, diarrhea, fever, urination pain, vomiting)        Nausea and vomiting, loose stool  11. PREGNANCY: Is there any chance you are pregnant? When was your last menstrual period?       No  Answer Assessment - Initial Assessment Questions 1. NAUSEA SEVERITY: How bad is the nausea? (e.g., mild, moderate, severe; dehydration, weight loss)     Mild to moderate  2. ONSET: When did the nausea begin?     Started last night  3. VOMITING: Any vomiting? If Yes, ask: How many times today?     Vomited this morning  4. RECURRENT SYMPTOM: Have you had nausea before? If Yes, ask: When was the last time? What happened that time?     Ys, has history of nausea and takes zofran   5. CAUSE: What do you think is causing the nausea?     Unsure of cause  6. PREGNANCY: Is there any chance you are pregnant? (e.g., unprotected intercourse, missed birth control pill, broken condom)     No  Protocols used: Nausea-A-AH, Abdominal Pain - Female-A-AH

## 2023-10-22 NOTE — Telephone Encounter (Signed)
 Agree with UC disposition  Aware, will watch for correspondence

## 2023-10-23 ENCOUNTER — Ambulatory Visit
Admission: RE | Admit: 2023-10-23 | Discharge: 2023-10-23 | Disposition: A | Discharge status: Home / Self Care | Payer: Self-pay | Attending: Emergency Medicine | Admitting: Emergency Medicine

## 2023-10-23 ENCOUNTER — Emergency Department
Admission: EM | Admit: 2023-10-23 | Discharge: 2023-10-23 | Disposition: A | Discharge status: Home / Self Care | Source: Other Acute Inpatient Hospital | Attending: Emergency Medicine | Admitting: Emergency Medicine

## 2023-10-23 ENCOUNTER — Emergency Department

## 2023-10-23 ENCOUNTER — Encounter: Payer: Self-pay | Admitting: Intensive Care

## 2023-10-23 ENCOUNTER — Other Ambulatory Visit: Payer: Self-pay

## 2023-10-23 VITALS — BP 104/67 | HR 86 | Temp 98.0°F | Resp 18

## 2023-10-23 DIAGNOSIS — K5900 Constipation, unspecified: Secondary | ICD-10-CM | POA: Diagnosis not present

## 2023-10-23 DIAGNOSIS — R112 Nausea with vomiting, unspecified: Secondary | ICD-10-CM | POA: Insufficient documentation

## 2023-10-23 DIAGNOSIS — R1031 Right lower quadrant pain: Secondary | ICD-10-CM | POA: Diagnosis not present

## 2023-10-23 DIAGNOSIS — R1114 Bilious vomiting: Secondary | ICD-10-CM

## 2023-10-23 DIAGNOSIS — N200 Calculus of kidney: Secondary | ICD-10-CM | POA: Diagnosis not present

## 2023-10-23 DIAGNOSIS — R1030 Lower abdominal pain, unspecified: Secondary | ICD-10-CM | POA: Diagnosis not present

## 2023-10-23 DIAGNOSIS — R109 Unspecified abdominal pain: Secondary | ICD-10-CM | POA: Diagnosis not present

## 2023-10-23 LAB — COMPREHENSIVE METABOLIC PANEL WITH GFR
ALT: 19 U/L (ref 0–44)
AST: 22 U/L (ref 15–41)
Albumin: 3.9 g/dL (ref 3.5–5.0)
Alkaline Phosphatase: 53 U/L (ref 38–126)
Anion gap: 11 (ref 5–15)
BUN: 13 mg/dL (ref 8–23)
CO2: 26 mmol/L (ref 22–32)
Calcium: 9 mg/dL (ref 8.9–10.3)
Chloride: 105 mmol/L (ref 98–111)
Creatinine, Ser: 0.97 mg/dL (ref 0.44–1.00)
GFR, Estimated: >60 mL/min (ref >60)
Glucose, Bld: 100 mg/dL — ABNORMAL HIGH (ref 70–99)
Potassium: 3.5 mmol/L (ref 3.5–5.1)
Sodium: 142 mmol/L (ref 135–145)
Total Bilirubin: 0.5 mg/dL (ref 0.0–1.2)
Total Protein: 6.5 g/dL (ref 6.5–8.1)

## 2023-10-23 LAB — POCT URINE DIPSTICK
Bilirubin, UA: NEGATIVE
Blood, UA: NEGATIVE
Glucose, UA: >=1000 mg/dL — AB
Ketones, POC UA: NEGATIVE mg/dL
Nitrite, UA: NEGATIVE
Spec Grav, UA: 1.02 (ref 1.010–1.025)
Urobilinogen, UA: 0.2 U/dL
pH, UA: 5.5 (ref 5.0–8.0)

## 2023-10-23 LAB — CBC
HCT: 49.3 % — ABNORMAL HIGH (ref 36.0–46.0)
Hemoglobin: 16.3 g/dL — ABNORMAL HIGH (ref 12.0–15.0)
MCH: 31.4 pg (ref 26.0–34.0)
MCHC: 33.1 g/dL (ref 30.0–36.0)
MCV: 95 fL (ref 80.0–100.0)
Platelets: 281 K/uL (ref 150–400)
RBC: 5.19 MIL/uL — ABNORMAL HIGH (ref 3.87–5.11)
RDW: 11.9 % (ref 11.5–15.5)
WBC: 5.4 K/uL (ref 4.0–10.5)
nRBC: 0 % (ref 0.0–0.2)

## 2023-10-23 LAB — LIPASE, BLOOD: Lipase: 29 U/L (ref 11–51)

## 2023-10-23 LAB — SEDIMENTATION RATE: Sed Rate: 3 mm/h (ref 0–30)

## 2023-10-23 MED ORDER — IOHEXOL 300 MG/ML  SOLN
100.0000 mL | Freq: Once | INTRAMUSCULAR | Status: AC | PRN
  Administered 2023-10-23: 100 mL via INTRAVENOUS

## 2023-10-23 MED ORDER — FLEET ENEMA RE ENEM: 1.0000 | ENEMA | Freq: Every day | RECTAL | 0 refills | Status: AC | PRN

## 2023-10-23 MED ORDER — SODIUM CHLORIDE 0.9 % IV BOLUS
1000.0000 mL | Freq: Once | INTRAVENOUS | Status: AC
  Administered 2023-10-23: 1000 mL via INTRAVENOUS

## 2023-10-23 NOTE — ED Triage Notes (Addendum)
 Patient to Urgent Care with complaints of abdominal pain/ nausea/ vomiting/ constipation. Symptoms x2 days.  Reports intermittent, RLQ pain >24 hours. States that she has vomited 2L of dark, green, cloudy fluid. Reports this helped with the pressure in her stomach.

## 2023-10-23 NOTE — ED Notes (Signed)
 Patient is being discharged from the Urgent Care and sent to the Emergency Department via POV . Per Burnard Cork NP, patient is in need of higher level of care due to RLQ pain/ vomiting. Patient is aware and verbalizes understanding of plan of care.  Vitals:   10/23/23 1218  BP: 104/67  Pulse: 86  Resp: 18  Temp: 98 F (36.7 C)  SpO2: 98%

## 2023-10-23 NOTE — ED Triage Notes (Signed)
 Patient sent from UC. C/o abdominal pain with N/V X2 days. Reports nausea has gotten better but tenderness on abdomen is still present.

## 2023-10-23 NOTE — ED Provider Notes (Signed)
 [name]    CSN: [id number] Arrival date & time: 10/23/23  1201      History   Chief Complaint Chief Complaint  Patient presents with   Nausea    RLQ pain > 24 hrs. Only intake in 24 hrs were several sips of ginger ale in 24 hrs.I, vomited about 2 L dark green cloudy fluid. at 7:30 p m tonightFelt Some relief.Appendectomy many years ago. Hx breast cancer 2016 bil mastectomies - Entered by patient    HPI Lindsay Fry is a 63 y.o. female.  Patient presents with 2-day history of right side abdominal pain, upper and lower.  She reports nausea with 1 episode of emesis yesterday evening; vomited large amount of dark green bile.  She feels bloated and constipated.  No fever or diarrhea.  Her medical history includes constipation, ulcerative colitis, GERD.  Patient is followed by gastroenterology; last seen on 10/15/2023.  Patient reports history of appendectomy and cholecystectomy.  The history is provided by the patient and medical records.    Past Medical History:  Diagnosis Date   ADD (attention deficit disorder) 11/19/2012   Allergic rhinitis, cause unspecified    Anxiety state, unspecified    Arthritis    Asymptomatic postmenopausal status (age-related) (natural)    Atypical hyperplasia of left breast 2000   Atypical mole 03/16/2018   R paraspinal mid upper back, excision   Atypical mole 02/17/2018   R spinal upper back, moderate   Atypical mole 08/11/2017   L sternum, excision   Atypical mole 05/27/2017   L post neck, moderate   Benign neoplasm of other and unspecified site of the digestive system    Bipolar disorder (HCC)    Breast cancer (HCC)    Left Breast 6mm invasive CA left uiq, dcis left uoq and a lot ADH, ALH in both breasts.   CAD (coronary artery disease)    a. 08/2014 NSTEMI/Cath: LM nl, LAD 50p, D1/D2 min irregs, LCX nl, OM1 40, LPDA nl, RI 99ost (2.40mm vessel->med Rx), RCA nl, AM 60, nl EF; b. 11/2016 MV: EF 74%, no ischemia (performed 2/2  dyspnea & new inf TWI).   Carcinoma of left breast (HCC) 06/02/2014   Cataract    Chronic diastolic CHF (congestive heart failure) (HCC)    a. 08/2014 Echo: EF 60-65%, Gr 1 DD, nl LA.   Clotting disorder    on blood thiner, Plavix    Cyst    on Achilles tendon   Depression    Diabetes mellitus (HCC)    frank   Dysuria    Edema    Family history of malignant neoplasm of gastrointestinal tract    Fibromyalgia    GERD (gastroesophageal reflux disease)    Glaucoma    pre glaucoma, 05/14/2018 pt. denies glaucoma   Headache    migraines   Hiatal hernia    History of alcoholism (HCC)    History of migraines    History of ovarian cyst    Hx of adenomatous colonic polyps    Insomnia, unspecified    Myocardial infarction (HCC) 09-04-14   OCD (obsessive compulsive disorder)    Osteopenia 09/25/2016   Femoral neck T -1.1  9/18   Other screening mammogram    Pure hypercholesterolemia    Rosacea    Subacute confusional state 11/19/2012   Tubular adenoma of colon 01/10/11   Ulcerative colitis, unspecified    Unspecified asthma(493.90)    Unspecified essential hypertension    Unspecified hypothyroidism  Unspecified vitamin D  deficiency    Vertigo     Patient Active Problem List   Diagnosis Date Noted   Erythrocytosis 09/16/2023   Bacterial conjunctivitis of right eye 05/13/2023   Encounter for gynecological examination 05/12/2023   Generalized body aches 04/18/2023   Shoulder pain, right 08/07/2022   Ulcerative (chronic) pancolitis without complications (HCC) 04/29/2022   Stress reaction 10/29/2021   Bipolar 2 disorder (HCC) 04/17/2021   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   Elevated transaminase level 03/27/2020   Current use of proton pump inhibitor 03/19/2020   Hypokalemia 03/07/2020   Aortic atherosclerosis 03/07/2020   MCI (mild cognitive impairment) 08/05/2019   Poor balance 12/16/2018   Fatigue 12/16/2018   Migraine 11/12/2018    Cervical radiculopathy due to degenerative joint disease of spine 10/28/2017   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   Osteopenia 09/25/2016   Estrogen deficiency 05/30/2016   Dysuria 03/19/2016   Chronic constipation 01/18/2015   NSTEMI (non-ST elevated myocardial infarction) (HCC) 09/06/2014   Atherosclerosis of native coronary artery of native heart with angina pectoris 09/06/2014   DM (diabetes mellitus) (HCC) 09/05/2014   History of breast cancer 06/02/2014   ADD (attention deficit disorder) 11/19/2012   Low back pain 08/05/2012   Hypothyroid 05/18/2012   Chronic sinusitis 01/24/2012   Asymptomatic postmenopausal status 06/22/2008   Vitamin D  deficiency 03/18/2008   BENIGN NEOPLASM OTH&UNSPEC SITE DIGESTIVE SYSTEM 01/09/2007   Diaphragmatic hernia 01/09/2007   History of colonic polyps 01/09/2007   Hyperlipidemia associated with type 2 diabetes mellitus (HCC) 01/07/2007   Essential hypertension 01/07/2007   Allergic rhinitis 01/07/2007   Asthma 01/07/2007   GERD 01/07/2007   ACNE ROSACEA 01/07/2007   MIGRAINES, HX OF 01/07/2007   Fibromyalgia 10/30/2006   INSOMNIA 10/30/2006    Past Surgical History:  Procedure Laterality Date   APPENDECTOMY     AXILLARY SENTINEL NODE BIOPSY Left 05/23/2014   Procedure: AXILLARY SENTINEL NODE BIOPSY;  Surgeon: Louanne KANDICE Muse, MD;  Location: [facility];  Service: General;  Laterality: Left;   BREAST BIOPSY  08/1998   on tamoxifen, atypical hyperplasia   BREAST BIOPSY Left 04/2014   BREAST SURGERY Left 08/1998   lumpectomy/ Dr Rollene   BREAST SURGERY Bilateral 05/23/2014   Mastectomy   BUNIONECTOMY Right    CARDIAC CATHETERIZATION N/A 09/05/2014   Procedure: Left Heart Cath and Coronary Angiography;  Surgeon: Deatrice DELENA Cage, MD;  Location: [facility];  Service: Cardiovascular;  Laterality: N/A;   CARDIAC CATHETERIZATION  09/05/2014   CATARACT EXTRACTION W/ INTRAOCULAR LENS IMPLANT Bilateral    CESAREAN SECTION  1996/1997    placenta previa, gest DM, pre-eclampsia   CHOLECYSTECTOMY  1997   adhesions also   COLONOSCOPY  01/2001   Ulcerative colitis   COLONOSCOPY  04/2004   UC, polyp   COLONOSCOPY  12/2006   UC, no polyps   CORONARY STENT INTERVENTION N/A 04/06/2020   Procedure: CORONARY STENT INTERVENTION;  Surgeon: Mady Bruckner, MD;  Location: [facility];  Service: Cardiovascular;  Laterality: N/A;   DEXA  04/1999 and 2010   normal   ESOPHAGOGASTRODUODENOSCOPY  09/2001   polyp   exercise stress test  11/2003   negative   FEMUR FRACTURE SURGERY Left    LEFT HEART CATH AND CORONARY ANGIOGRAPHY N/A 04/06/2020   Procedure: LEFT HEART CATH AND CORONARY ANGIOGRAPHY;  Surgeon: Perla Evalene PARAS, MD;  Location: [facility];  Service: Cardiovascular;  Laterality: N/A;   MASTECTOMY Bilateral  MECKEL DIVERTICULUM EXCISION  1999   NASAL SINUS SURGERY  05/1997   nuclear stress test  06/2007   negative   precancerous mole removed     RIGHT AND LEFT HEART CATH     with 1 stent placed in March 2022   SIMPLE MASTECTOMY WITH AXILLARY SENTINEL NODE BIOPSY Bilateral 05/23/2014   Procedure: Bilateral simple mastectomy, left sentinel node biopsy ;  Surgeon: Louanne KANDICE Muse, MD;  Location: [facility];  Service: General;  Laterality: Bilateral;   Sleep study  11/2007   no apnea, but did snore (done by [facility])   TONSILLECTOMY  1984   TUBAL LIGATION     WISDOM TOOTH EXTRACTION  1980's    OB History     Gravida  2   Para  2   Term      Preterm      AB      Living         SAB      IAB      Ectopic      Multiple      Live Births           Obstetric Comments  1st Menstrual Cycle:  11  1st Pregnancy:  33          Home Medications    Prior to Admission medications   Medication Sig Start Date End Date Taking? Authorizing Provider  acetaminophen  (TYLENOL ) 650 MG CR tablet Take 1,300 mg by mouth 2 (two) times daily.    [provider]  albuterol   (VENTOLIN  HFA) 108 (90 Base) MCG/ACT inhaler INHALE 1 PUFF INTO THE LUNGS EVERY 4 HOURS AS NEEDED FOR WHEEZING 05/05/23   Tower, Laine LABOR, MD  amantadine  (SYMMETREL ) 100 MG capsule Take 100 mg by mouth 2 (two) times daily.    [provider]  Ascorbic Acid  (VITAMIN C  PO) Take 1,000 mg by mouth daily.    [provider]  aspirin  81 MG chewable tablet Chew 1 tablet (81 mg total) by mouth daily. 06/04/21   Furth, Cadence H, PA-C  atorvastatin  (LIPITOR ) 40 MG tablet TAKE 1 TABLET BY MOUTH ONCE DAILY 12/02/22   Gollan, Timothy J, MD  B Complex Vitamins (VITAMIN B COMPLEX PO) Take 1 tablet by mouth every other day.    [provider]  Blood Glucose Monitoring Suppl (ONETOUCH VERIO REFLECT) w/Device KIT Use to check blood sugar 3 times a day 10/01/23   Thapa, [place], MD  Calcium  Carb-Cholecalciferol  (CALTRATE 600+D3 PO) Take 1 tablet by mouth daily. 300 mg Vitamin D3    [provider]  Cholecalciferol  (VITAMIN D3 PO) Take 1 tablet by mouth daily.    [provider]  cyclobenzaprine  (FLEXERIL ) 5 MG tablet TAKE 1 TABLET BY MOUTH EVERY 8 HOURS AS NEEDED FOR MUSCLE SPASMS (BACK PAIN) 10/03/22   Tower, Laine LABOR, MD  dexmethylphenidate (FOCALIN) 10 MG tablet Take by mouth. Taking 20 mg at breakfast and 10 mg at noon 07/22/23   [provider]  diazepam  (VALIUM ) 10 MG tablet Take 2.5-7.5 mg by mouth daily as needed for anxiety.    [provider]  docusate sodium  (COLACE) 100 MG capsule Take 100-300 mg by mouth daily as needed for mild constipation or moderate constipation.    [provider]  empagliflozin  (JARDIANCE ) 25 MG TABS tablet Take 1 tablet (25 mg total) by mouth daily. 10/01/23   Thapa, [place], MD  Evolocumab  (REPATHA  SURECLICK) 140 MG/ML SOAJ INJECT 140MG  SUBCUTANEOUSLY EVERY 2 WEEKS  05/09/23   Gollan, Timothy J, MD  famotidine  (PEPCID ) 40 MG tablet Take 40 mg by mouth 2 (two) times daily. 30 min. Before meal and at bedtime    [provider]  fluticasone  (FLONASE ) 50 MCG/ACT nasal spray Place 1 spray into both nostrils daily. 12/05/20   Dugal, [name], FNP  gabapentin  (NEURONTIN ) 300 MG capsule TAKE 1 CAPSULE BY MOUTH 3 TIMES DAILY 04/25/23   Tower, Rye A, MD  glucose blood (ONETOUCH VERIO) test strip Use to check blood sugar 3 times a day 10/01/23   Thapa, [place], MD  ketoconazole  (NIZORAL ) 2 % cream Apply to affected areas one to two times daily as needed. For seborrheic dermatitis at face 10/07/23   Jackquline Sawyer, MD  lamoTRIgine  (LAMICTAL ) 200 MG tablet Take 400 mg by mouth daily.    [provider]  Lancets [facility] DELICA PLUS LANCET33G) MISC Use to check blood sugar 3 times a day. 10/01/23   Thapa, [place], MD  latanoprost  (XALATAN ) 0.005 % ophthalmic solution Place 1 drop into both eyes 2 (two) times a week.  10/12/10   [provider]  levothyroxine  (LEVOXYL ) 200 MCG tablet Take 1 tablet (200 mcg total) by mouth daily before breakfast. 10/10/23   Thapa, [place], MD  loperamide  (IMODIUM  A-D) 2 MG tablet Take 2 mg by mouth daily as needed for diarrhea or loose stools.    [provider]  Melatonin 10 MG CAPS Take 10 mg by mouth at bedtime.    [provider]  mesalamine  (LIALDA ) 1.2 g EC tablet Take 2 tablets (2.4 g total) by mouth daily. Office visit for further refills 04/11/23   Abran Norleen SAILOR, MD  Meth-Hyo-M Starlene Phos-Ph Sal (URO-MP ) 118 MG CAPS Take one pill by mouth four times daily as needed for urinary discomfort 08/18/23   Tower, Laine LABOR, MD  metoprolol  tartrate (LOPRESSOR ) 25 MG tablet Take 1 tablet (25 mg total) by mouth 2 (two) times daily as needed (for tachycardia). 09/09/22   Gollan, Timothy J, MD  Multiple Vitamin (MULTIVITAMIN) tablet Take 1 tablet by mouth daily. Woman 50 plus    [provider]  nitroGLYCERIN  (NITROSTAT ) 0.4 MG SL tablet Place 1 tablet (0.4 mg total) under the tongue every 5 (five) minutes as needed for chest pain. 09/03/21   Gollan, Timothy J, MD   ondansetron  (ZOFRAN -ODT) 4 MG disintegrating tablet DISSOLVE 1 TABLET ON THE TONGUE EVERY 6 HOURS AS NEEDED FOR NAUSEA OR VOMIT 08/21/23   Abran Norleen SAILOR, MD  pantoprazole  (PROTONIX ) 40 MG tablet TAKE 1 TABLET BY MOUTH TWICE DAILY 10/21/23   Perry, John N, MD  polyethylene glycol (MIRALAX  / GLYCOLAX ) 17 g packet Take 17 g by mouth 3 (three) times daily as needed for moderate constipation, mild constipation or severe constipation. In water  or beverage    [provider]  Probiotic Product (CULTURELLE PROBIOTICS PO) Take 1 capsule by mouth daily. For woman    [provider]  QUEtiapine  (SEROQUEL  XR) 400 MG 24 hr tablet Take 800 mg by mouth at bedtime.    [provider]  simethicone  (MYLICON) 80 MG chewable tablet Chew 80 mg by mouth 4 (four) times daily.    [provider]  tirzepatide  (MOUNJARO ) 15 MG/0.5ML Pen Inject 15 mg into the skin once a week. 10/01/23   Thapa, [place], MD  valACYclovir  (VALTREX ) 1000 MG tablet Take 0.5 tablets (500 mg total) by mouth daily. 09/16/23   Tower, Laine LABOR, MD  VRAYLAR  capsule Take 3 mg by  mouth at bedtime. 02/28/20   [provider]  zaleplon (SONATA) 10 MG capsule Take 10-20 mg by mouth at bedtime.    [provider]    Family History Family History  Problem Relation Age of Onset   Coronary artery disease Father    Colon cancer Father    Alzheimer's disease Father    Dementia Father    Colon polyps Father    Coronary artery disease Mother    Hypertension Mother    Osteoporosis Mother    Crohn's disease Mother        crohns colitis   Breast cancer Other        great aunts   Coronary artery disease Other        Uncle (also AAA)   Diabetes Other        remote family history   Stroke Cousin    Esophageal cancer Neg Hx    Rectal cancer Neg Hx    Stomach cancer Neg Hx     Social History Social History   Tobacco Use   Smoking status: Former    Current packs/day: 0.00    Average packs/day: 0.3  packs/day for 5.0 years (1.3 ttl pk-yrs)    Types: Cigarettes    Start date: 01/15/1988    Quit date: 01/14/1993    Years since quitting: 30.7   Smokeless tobacco: Never  Vaping Use   Vaping status: Never Used  Substance Use Topics   Alcohol use: No    Alcohol/week: 0.0 standard drinks of alcohol    Comment: Recovered ETOH   Drug use: No     Allergies   Ephedrine, Oxycodone , Aspirin , Erythromycin, Nsaids, and Rosuvastatin   Review of Systems Review of Systems  Constitutional:  Negative for chills and fever.  Gastrointestinal:  Positive for abdominal pain, constipation, nausea and vomiting. Negative for diarrhea.  Genitourinary:  Negative for dysuria, flank pain and hematuria.     Physical Exam Triage Vital Signs ED Triage Vitals  Encounter Vitals Group     BP      Girls Systolic BP Percentile      Girls Diastolic BP Percentile      Boys Systolic BP Percentile      Boys Diastolic BP Percentile      Pulse      Resp      Temp      Temp src      SpO2      Weight      Height      Head Circumference      Peak Flow      Pain Score      Pain Loc      Pain Education      Exclude from Growth Chart    No data found.  Updated Vital Signs BP 104/67   Pulse 86   Temp 98 F (36.7 C)   Resp 18   LMP 08/23/2006 (Approximate) Comment: tubal ligation  SpO2 98%   Visual Acuity Right Eye Distance:   Left Eye Distance:   Bilateral Distance:    Right Eye Near:   Left Eye Near:    Bilateral Near:     Physical Exam Constitutional:      General: She is not in acute distress. HENT:     Mouth/Throat:     Mouth: Mucous membranes are moist.  Cardiovascular:     Rate and Rhythm: Normal rate and regular rhythm.     Heart sounds: Normal heart sounds.  Pulmonary:     Effort: Pulmonary effort is normal. No respiratory distress.     Breath sounds: Normal breath sounds.  Abdominal:     General: Bowel sounds are normal.     Palpations: Abdomen is soft.     Tenderness:  There is abdominal tenderness in the right upper quadrant and right lower quadrant. There is no right CVA tenderness, left CVA tenderness, guarding or rebound.  Neurological:     Mental Status: She is alert.      UC Treatments / Results  Labs (all labs ordered are listed, but only abnormal results are displayed) Labs Reviewed  POCT URINE DIPSTICK - Abnormal; Notable for the following components:      Result Value   Glucose, UA >=1,000 (*)    Leukocytes, UA Small (1+) (*)    All other components within normal limits    EKG   Radiology No results found.  Procedures Procedures (including critical care time)  Medications Ordered in UC Medications - No data to display  Initial Impression / Assessment and Plan / UC Course  I have reviewed the triage vital signs and the nursing notes.  Pertinent labs & imaging results that were available during my care of the patient were reviewed by me and considered in my medical decision making (see chart for details).    Right side abdominal pain, nausea with 1 episode of emesis, constipation.  Afebrile and vital signs are stable.  Abdomen is tender to palpation along the right side.  Patient reports history of appendectomy and cholecystectomy.  Discussed limitations of evaluation of abdominal pain in an urgent care setting.  Sending her to the ED for evaluation.  She is agreeable to this and will go to [facility] ED.  She is accompanied by her husband and son.  Final Clinical Impressions(s) / UC Diagnoses   Final diagnoses:  Right sided abdominal pain  Bilious vomiting with nausea  Constipation, unspecified constipation type     Discharge Instructions      Go to the emergency department for evaluation of your abdominal pain and constipation, along with nausea and 1 episode of vomiting     ED Prescriptions   None    PDMP not reviewed this encounter.   Corlis Burnard DEL, NP 10/23/23 1249

## 2023-10-23 NOTE — Discharge Instructions (Signed)
 Go to the emergency department for evaluation of your abdominal pain and constipation, along with nausea and 1 episode of vomiting

## 2023-10-23 NOTE — ED Provider Notes (Signed)
 [facility] Provider Note    Event Date/Time   First MD Initiated Contact with Patient 10/23/23 1358     (approximate)   History   Abdominal Pain and Nausea   HPI  Lindsay Fry is a 63 y.o. female been experiencing right  abdominal pain and reports vomiting quite a bit of dark cloudy fluid.   Last evening started having pain in her middle right lower abdomen with nausea.  She reports she vomited a large amount of dark emesis on 1 occasion last night.  She reports it was quite a lot of dark sort of bile looking stuff.  She has not had any further vomiting.  She has been able to drink some water  and keep it on her stomach.  She has noticed that she just feels sore achy in the right lower abdomen.  It is not real painful.  She had a bowel movement this morning which was normal brown and soft.  She has had no fevers or chills no chest pain no pain burning with urination no back pain.  She has a remote history of ulcerative colitis but reports that was last active in about 1990.   Past medical history occludes ulcerative colitis reflux.  Additionally has a history of previous cardiac disease NSTEMI.  Diabetes.  Angina.  No one else around her has been sick.  She was seen at urgent care.  She did take Zofran  this morning which she takes that her GI doctor gives for her and that helped.  She has had no further nausea or vomiting and right now she reports she is really not having any pain except if she pushes on the stomach.  It does not hurt to move or walk.  Physical Exam   Triage Vital Signs: ED Triage Vitals  Encounter Vitals Group     BP 10/23/23 1257 137/82     Girls Systolic BP Percentile --      Girls Diastolic BP Percentile --      Boys Systolic BP Percentile --      Boys Diastolic BP Percentile --      Pulse Rate 10/23/23 1257 85     Resp 10/23/23 1257 16     Temp 10/23/23 1257 97.7 F (36.5 C)     Temp Source 10/23/23 1257 Oral      SpO2 10/23/23 1257 98 %     Weight 10/23/23 1259 137 lb (62.1 kg)     Height 10/23/23 1259 5' 6.5 (1.689 m)     Head Circumference --      Peak Flow --      Pain Score 10/23/23 1259 8     Pain Loc --      Pain Education --      Exclude from Growth Chart --    Prior appendectomy and cholecystectomy reported Most recent vital signs: Vitals:   10/23/23 1257  BP: 137/82  Pulse: 85  Resp: 16  Temp: 97.7 F (36.5 C)  SpO2: 98%     General: Awake, no distress.  Very pleasant accompanied by family CV:  Good peripheral perfusion.  Normal tones and rate Resp:  Normal effort.  Clear bilateral Abd:  No distention.  Soft throughout.  No masses are noted.  She does have tenderness reports to be mild without rebound or guarding primarily located in the right mid to right lower quadrant.  No costovertebral angle tenderness bilaterally.  No tenderness over the left side of the abdomen.  No obvious hernias or masses Other:  Warm well-perfused lower extremities.   ED Results / Procedures / Treatments   Labs (all labs ordered are listed, but only abnormal results are displayed) Labs Reviewed  COMPREHENSIVE METABOLIC PANEL WITH GFR - Abnormal; Notable for the following components:      Result Value   Glucose, Bld 100 (*)    All other components within normal limits  CBC - Abnormal; Notable for the following components:   RBC 5.19 (*)    Hemoglobin 16.3 (*)    HCT 49.3 (*)    All other components within normal limits  LIPASE, BLOOD  URINALYSIS, ROUTINE W REFLEX MICROSCOPIC  SEDIMENTATION RATE   No acute cardiovascular or pulmonary symptoms.  Warm well-perfused lower extremities  Urinalysis from urgent care without compelling evidence of UTI.  Additionally does not have symptoms consistent with urinary tract infection  RADIOLOGY  CT pending   PROCEDURES:  Critical Care performed: No  Procedures   MEDICATIONS ORDERED IN ED: Medications  sodium chloride  0.9 % bolus 1,000 mL  (1,000 mLs Intravenous New Bag/Given 10/23/23 1440)     IMPRESSION / MDM / ASSESSMENT AND PLAN / ED COURSE  I reviewed the triage vital signs and the nursing notes.                              Differential diagnosis includes but is not limited to, abdominal perforation, aortic dissection, cholecystitis, appendicitis, diverticulitis, colitis, esophagitis/gastritis, kidney stone, pyelonephritis, urinary tract infection, aortic aneurysm. All are considered in decision and treatment plan. Based upon the patient's presentation and risk factors, as well as the amount of bilious emesis she reported will proceed with CT imaging hydrate.  No significant pain or nausea ongoing at this time.  Seems better if p.o. Zofran  taken at home.  Her exam is fairly reassuring except she does still have some focal albeit somewhat mild symptoms of pain in the right mid to right lower quadrant.  No urinary symptoms.  Will proceed with obtaining imaging IV fluids.  Based on her imaging her CT scan is normal she is able to take by mouth she can likely go home if no concerning findings.  Could be something such as a brief enteritis gastritis or other symptomatology as it seems her course is improved after multiple episodes of vomiting yesterday though it was nonbloody.     Patient's presentation is most consistent with acute complicated illness / injury requiring diagnostic workup.   ----------------------------------------- 3:24 PM on 10/23/2023 ----------------------------------------- Ongoing care and disposition assigned to Dr. Nicholaus.  Reevaluate patient, follow-up on CT imaging of the abdomen pelvis.  ESR pending.       FINAL CLINICAL IMPRESSION(S) / ED DIAGNOSES   Final diagnoses:  Right lower quadrant abdominal pain  Nausea and vomiting, unspecified vomiting type     Rx / DC Orders   ED Discharge Orders     None        Note:  This document was prepared using Dragon voice recognition software  and may include unintentional dictation errors.   Dicky Anes, MD 10/23/23 1524

## 2023-10-23 NOTE — ED Provider Notes (Signed)
 Clinical Course as of 10/23/23 1710  Thu Oct 23, 2023  1537 Received signout: pending []  CT abd right sided abdominal pain. UA done at urgent care. Hx of UC, appendectomy, cholecystecomy. PO challenge home [HD]  1708 Patient well-appearing tolerating p.o.  Repeat abdominal exam benign.  Patient already sees a gastroenterologist and is managing her medications.  She states that however that she has been too nauseous to take these medications so we will send the patient home with scripts for a Fleet enema.  Return precautions reviewed and patient has been voiced understanding.  I did print out a copy of the incidental findings for the patient to review and voiced understanding [HD]    Clinical Course User Index [HD] Nicholaus Rolland BRAVO, MD   At time of discharge there is no evidence of acute life, limb, vision, or fertility threat. Patient has stable vital signs, pain is well controlled, patient is ambulatory and p.o. tolerant.  Discharge instructions were completed using the EPIC system. I would refer you to those at this time. All warnings prescriptions follow-up etc. were discussed in detail with the patient. Patient indicates understanding and is agreeable with this plan. All questions answered.  Patient is made aware that they may return to the emergency department for any worsening or new condition or for any other emergency.    Nicholaus Rolland BRAVO, MD 10/24/23 [phone]

## 2023-10-23 NOTE — Discharge Instructions (Signed)

## 2023-11-04 ENCOUNTER — Other Ambulatory Visit (HOSPITAL_COMMUNITY): Payer: Self-pay

## 2023-11-04 ENCOUNTER — Ambulatory Visit: Payer: Self-pay

## 2023-11-04 ENCOUNTER — Encounter: Payer: Self-pay | Admitting: Endocrinology

## 2023-11-04 ENCOUNTER — Telehealth: Payer: Self-pay

## 2023-11-04 NOTE — Telephone Encounter (Signed)
 Agree with ER advisement given severity of symptoms  Will watch for correspondence

## 2023-11-04 NOTE — Telephone Encounter (Signed)
 FYI Only or Action Required?: FYI only for provider.  Patient was last seen in primary care on 08/18/2023 by Lindsay Laine LABOR, MD.  Called Nurse Triage reporting Flank Pain.  Symptoms began yesterday.  Interventions attempted: OTC medications: Gas X, Benefiber, Miralax .  Symptoms are: gradually improving.  Triage Disposition: Go to ED Now (Notify PCP)  Patient/caregiver understands and will follow disposition?: No, wishes to speak with PCP  Copied from CRM #[id number]. Topic: Clinical - Red Word Triage >> Nov 04, 2023 10:13 AM Pinkey Fry wrote: Red Word that prompted transfer to Nurse Triage: Worsening Pain + Swelling >> Nov 04, 2023 10:14 AM Pinkey Fry wrote: Patient states she's experiencing some intense worsening pain on the lower left side. Patient also mentioned some swelling.  Reason for Disposition  [1] Sudden onset of severe flank pain AND [2] age > 60 years  Answer Assessment - Initial Assessment Questions Lindsay Fry to [facility] ED on 10/9 for severe left sided flank pain and N/V. MRI at that time showed constipation and was prescribed benefiber and miralax  which has effectively solved constipation and pain. Reports sudden onset of 8/10 pain in RLQ of abdomen last night for about 2 hrs, pain then came down to 2/10. Advised ED for sudden onset severe abdominal pain as well as for pts hx of diverticulitis. Pt initially inquiring if she could be seen in office, called [facility] and spoke with clinical staff Joellen, reports pt would need to go to ED given current symptoms and would also be unable to work pt in for appt today. Pt notified, reports she would be able to go later today.  1. LOCATION: Where does it hurt? (e.g., left, right)     RLQ of abdomen  2. ONSET: When did the pain start?     Last night. Hasn't had any pain since 10/10  3. SEVERITY: How bad is the pain? (e.g., Scale 1-10; mild, moderate, or severe)     Gets up to 8/10 pain. Currently 2/10 pain. Feels swollen and  tender. Movement makes pain worse. Lying on left side improves pain.  4. PATTERN: Does the pain come and go, or is it constant?      Comes and goes.  5. CAUSE: What do you think is causing the pain?     Diverticulitis  6. OTHER SYMPTOMS:  Do you have any other symptoms? (e.g., fever, abdomen pain, vomiting, leg weakness, burning with urination, blood in urine)     No. Denies fever. Hx of diverticulitis. Reports she having regular BM's. Pt reports is feels like she has trapped gas and unable to burp. Took 3 Gas-X tabs, ineffective.  7. PREGNANCY:  Is there any chance you are pregnant? When was your last menstrual period?     No  Protocols used: Flank Pain-A-AH

## 2023-11-04 NOTE — Telephone Encounter (Signed)
 Pharmacy Patient Advocate Encounter   Received notification from Patient Advice Request messages that prior authorization for Mounjaro  15MG /0.5ML auto-injectors is required/requested.   Insurance verification completed.   The patient is insured through [employer] ADVANTAGE/RX ADVANCE.   Per test claim: PA required; PA submitted to above mentioned insurance via Latent Key/confirmation #/[id number] [facility] Status is pending   PA has been APPROVED from 11/04/2023 to 11/03/2024

## 2023-11-05 DIAGNOSIS — F3173 Bipolar disorder, in partial remission, most recent episode manic: Secondary | ICD-10-CM | POA: Diagnosis not present

## 2023-11-05 DIAGNOSIS — F901 Attention-deficit hyperactivity disorder, predominantly hyperactive type: Secondary | ICD-10-CM | POA: Diagnosis not present

## 2023-11-05 DIAGNOSIS — F411 Generalized anxiety disorder: Secondary | ICD-10-CM | POA: Diagnosis not present

## 2023-11-05 DIAGNOSIS — F3112 Bipolar disorder, current episode manic without psychotic features, moderate: Secondary | ICD-10-CM | POA: Diagnosis not present

## 2023-11-11 ENCOUNTER — Ambulatory Visit: Admitting: Endocrinology

## 2023-11-11 ENCOUNTER — Ambulatory Visit: Payer: Self-pay | Admitting: Endocrinology

## 2023-11-11 ENCOUNTER — Encounter: Payer: Self-pay | Admitting: Endocrinology

## 2023-11-11 VITALS — BP 126/80 | HR 93 | Resp 16 | Ht 66.5 in | Wt 138.2 lb

## 2023-11-11 DIAGNOSIS — E890 Postprocedural hypothyroidism: Secondary | ICD-10-CM

## 2023-11-11 DIAGNOSIS — Z7984 Long term (current) use of oral hypoglycemic drugs: Secondary | ICD-10-CM | POA: Diagnosis not present

## 2023-11-11 DIAGNOSIS — E782 Mixed hyperlipidemia: Secondary | ICD-10-CM | POA: Diagnosis not present

## 2023-11-11 DIAGNOSIS — E118 Type 2 diabetes mellitus with unspecified complications: Secondary | ICD-10-CM | POA: Diagnosis not present

## 2023-11-11 LAB — POCT GLYCOSYLATED HEMOGLOBIN (HGB A1C): Hemoglobin A1C: 5.2 % (ref 4.0–5.6)

## 2023-11-11 NOTE — Progress Notes (Signed)
 Outpatient Endocrinology Note Carrolyn Hilmes, MD  11/11/23  Patient's Name: Lindsay Fry    DOB: 08/24/1960    MRN: [id number]                                                    REASON OF VISIT: Follow up for type 2 diabetes mellitus  PCP: Tower, Laine LABOR, MD  HISTORY OF PRESENT ILLNESS:   Lindsay Fry is a 63 y.o. old female with past medical history listed below, is here for follow up for type 2 diabetes mellitus.   Pertinent Diabetes History: Patient was previously seen by Dr. Von and was last time seen in August 2024.  Patient was diagnosed with type 2 diabetes mellitus in March 2016.  She has been on various antidiabetic medications.  Insulin  therapy was started in August 2017.  Patient has controlled type 2 diabetes mellitus.  Chronic Diabetes Complications : Retinopathy: no. Last ophthalmology exam was done on DUE.  Nephropathy: no Peripheral neuropathy: no.  Patient has been on gabapentin  for fibromyalgia. Coronary artery disease: yes Stroke: no  Relevant comorbidities and cardiovascular risk factors: Obesity: no Body mass index is 21.97 kg/m.  Hypertension: Yes  Hyperlipidemia : Yes, on statin.  She is on Repatha .  Current / Home Diabetic regimen includes:  Mounjaro  15 mg weekly. Jardiance  25 mg daily    Prior diabetic medications: Metformin  ER 2000 mg daily, stopped due to diarrhea.  She used to be on Janumet , Victoza  in the past.  Ozempic  was changed to Mounjaro .  Levemir  was changed to Tresiba  in 2024. Tresiba  15 units was stopped in March 2025 after improvement of diabetes control /mild hypoglycemia.  Glycemic data:   Ascensia Contour next one glucometer data from October 14 to October 28 , 2025 downloaded and reviewed, average blood sugar 126.  She has been checking mostly once a day in the morning fasting, lowest blood sugar 86 and highest 196.  Mostly acceptable blood sugar.  She declined continuous glucose monitoring.  Hypoglycemia: Patient  has no hypoglycemic episodes. Patient has hypoglycemia awareness\.  Factors modifying glucose control: 1.  Diabetic diet assessment: 3 meals a day.  2.  Staying active or exercising:   3.  Medication compliance: compliant all of the time.  # Postablative hypothyroidism -First diagnosed in 1992, after treatment for Graves' disease with RAI I-131.  She has been on relatively large dose of levothyroxine  supplement.  Dose has been adjusted.  Regularly in the past.  Lately she has been on levothyroxine  / Levoxyl  200 mcg daily.  Dose was decreased in June 2025 due to low TSH.  # She has osteopenia, last DEXA scan in February 2022, managed by primary care provider.  Interval history  Recent laboratory results reviewed normal electrolytes and renal function.  Hemoglobin A1c today 5.2%.  Diabetes regimen as reviewed and noted above.  She has been taking Zofran  as needed otherwise no significant GI issues with Mounjaro .  She has been taking levothyroxine  200 mg daily dose was decreasing last visit and had normal thyroid  function test in August, denies palpitation and heat intolerance.  No other complaints today.  REVIEW OF SYSTEMS As per history of present illness.   PAST MEDICAL HISTORY: Past Medical History:  Diagnosis Date   ADD (attention deficit disorder) 11/19/2012   Allergic rhinitis, cause unspecified  Anxiety state, unspecified    Arthritis    Asymptomatic postmenopausal status (age-related) (natural)    Atypical hyperplasia of left breast 2000   Atypical mole 03/16/2018   R paraspinal mid upper back, excision   Atypical mole 02/17/2018   R spinal upper back, moderate   Atypical mole 08/11/2017   L sternum, excision   Atypical mole 05/27/2017   L post neck, moderate   Benign neoplasm of other and unspecified site of the digestive system    Bipolar disorder (HCC)    Breast cancer (HCC)    Left Breast 6mm invasive CA left uiq, dcis left uoq and a lot ADH, ALH in both breasts.    CAD (coronary artery disease)    a. 08/2014 NSTEMI/Cath: LM nl, LAD 50p, D1/D2 min irregs, LCX nl, OM1 40, LPDA nl, RI 99ost (2.75mm vessel->med Rx), RCA nl, AM 60, nl EF; b. 11/2016 MV: EF 74%, no ischemia (performed 2/2 dyspnea & new inf TWI).   Carcinoma of left breast (HCC) 06/02/2014   Cataract    Chronic diastolic CHF (congestive heart failure) (HCC)    a. 08/2014 Echo: EF 60-65%, Gr 1 DD, nl LA.   Clotting disorder    on blood thiner, Plavix    Cyst    on Achilles tendon   Depression    Diabetes mellitus (HCC)    frank   Dysuria    Edema    Family history of malignant neoplasm of gastrointestinal tract    Fibromyalgia    GERD (gastroesophageal reflux disease)    Glaucoma    pre glaucoma, 05/14/2018 pt. denies glaucoma   Headache    migraines   Hiatal hernia    History of alcoholism (HCC)    History of migraines    History of ovarian cyst    Hx of adenomatous colonic polyps    Insomnia, unspecified    Myocardial infarction (HCC) 09-04-14   OCD (obsessive compulsive disorder)    Osteopenia 09/25/2016   Femoral neck T -1.1  9/18   Other screening mammogram    Pure hypercholesterolemia    Rosacea    Subacute confusional state 11/19/2012   Tubular adenoma of colon 01/10/11   Ulcerative colitis, unspecified    Unspecified asthma(493.90)    Unspecified essential hypertension    Unspecified hypothyroidism    Unspecified vitamin D  deficiency    Vertigo     PAST SURGICAL HISTORY: Past Surgical History:  Procedure Laterality Date   APPENDECTOMY     AXILLARY SENTINEL NODE BIOPSY Left 05/23/2014   Procedure: AXILLARY SENTINEL NODE BIOPSY;  Surgeon: Louanne KANDICE Muse, MD;  Location: [facility];  Service: General;  Laterality: Left;   BREAST BIOPSY  08/1998   on tamoxifen, atypical hyperplasia   BREAST BIOPSY Left 04/2014   BREAST SURGERY Left 08/1998   lumpectomy/ Dr Rollene   BREAST SURGERY Bilateral 05/23/2014   Mastectomy   BUNIONECTOMY Right    CARDIAC  CATHETERIZATION N/A 09/05/2014   Procedure: Left Heart Cath and Coronary Angiography;  Surgeon: Deatrice DELENA Cage, MD;  Location: [facility];  Service: Cardiovascular;  Laterality: N/A;   CARDIAC CATHETERIZATION  09/05/2014   CATARACT EXTRACTION W/ INTRAOCULAR LENS IMPLANT Bilateral    CESAREAN SECTION  1996/1997   placenta previa, gest DM, pre-eclampsia   CHOLECYSTECTOMY  1997   adhesions also   COLONOSCOPY  01/2001   Ulcerative colitis   COLONOSCOPY  04/2004   UC, polyp   COLONOSCOPY  12/2006   UC, no polyps  CORONARY STENT INTERVENTION N/A 04/06/2020   Procedure: CORONARY STENT INTERVENTION;  Surgeon: Mady Bruckner, MD;  Location: [facility];  Service: Cardiovascular;  Laterality: N/A;   DEXA  04/1999 and 2010   normal   ESOPHAGOGASTRODUODENOSCOPY  09/2001   polyp   exercise stress test  11/2003   negative   FEMUR FRACTURE SURGERY Left    LEFT HEART CATH AND CORONARY ANGIOGRAPHY N/A 04/06/2020   Procedure: LEFT HEART CATH AND CORONARY ANGIOGRAPHY;  Surgeon: Perla Evalene PARAS, MD;  Location: [facility];  Service: Cardiovascular;  Laterality: N/A;   MASTECTOMY Bilateral    MECKEL DIVERTICULUM EXCISION  1999   NASAL SINUS SURGERY  05/1997   nuclear stress test  06/2007   negative   precancerous mole removed     RIGHT AND LEFT HEART CATH     with 1 stent placed in March 2022   SIMPLE MASTECTOMY WITH AXILLARY SENTINEL NODE BIOPSY Bilateral 05/23/2014   Procedure: Bilateral simple mastectomy, left sentinel node biopsy ;  Surgeon: Louanne KANDICE Muse, MD;  Location: [facility];  Service: General;  Laterality: Bilateral;   Sleep study  11/2007   no apnea, but did snore (done by [facility])   TONSILLECTOMY  1984   TUBAL LIGATION     WISDOM TOOTH EXTRACTION  1980's    ALLERGIES: Allergies  Allergen Reactions   Ephedrine Other (See Comments)    Pt becomes hyper   Oxycodone  Other (See Comments)    Panic Attack   Aspirin  Other (See Comments)     REACTION: aggrivates colitis Tolerates 81 mg but unable to take doses >81 mg   Erythromycin Other (See Comments)    REACTION: GI upset   Nsaids     REACTION: aggrivate colitis- GI ok'd for short term use   Rosuvastatin Other (See Comments)    REACTION: increased lfts    FAMILY HISTORY:  Family History  Problem Relation Age of Onset   Coronary artery disease Father    Colon cancer Father    Alzheimer's disease Father    Dementia Father    Colon polyps Father    Coronary artery disease Mother    Hypertension Mother    Osteoporosis Mother    Crohn's disease Mother        crohns colitis   Breast cancer Other        great aunts   Coronary artery disease Other        Uncle (also AAA)   Diabetes Other        remote family history   Stroke Cousin    Esophageal cancer Neg Hx    Rectal cancer Neg Hx    Stomach cancer Neg Hx     SOCIAL HISTORY: Social History   Socioeconomic History   Marital status: Married    Spouse name: alan   Number of children: 2   Years of education: college   Highest education level: Bachelor's degree (e.g., BA, AB, BS)  Occupational History   Occupation: unemployed  Tobacco Use   Smoking status: Former    Current packs/day: 0.00    Average packs/day: 0.3 packs/day for 5.0 years (1.3 ttl pk-yrs)    Types: Cigarettes    Start date: 01/15/1988    Quit date: 01/14/1993    Years since quitting: 30.8   Smokeless tobacco: Never  Vaping Use   Vaping status: Never Used  Substance and Sexual Activity   Alcohol use: No    Alcohol/week: 0.0 standard drinks  of alcohol    Comment: Recovered ETOH   Drug use: No   Sexual activity: Not Currently    Partners: Male  Other Topics Concern   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      [employer] telephone triage   Social Drivers of Health   Financial Resource Strain: Low Risk  (10/09/2023)   Overall  Financial Resource Strain (CARDIA)    Difficulty of Paying Living Expenses: Not very hard  Food Insecurity: No Food Insecurity (10/09/2023)   Hunger Vital Sign    Worried About Running Out of Food in the Last Year: Never true    Ran Out of Food in the Last Year: Never true  Transportation Needs: No Transportation Needs (10/09/2023)   PRAPARE - Administrator, Civil Service (Medical): No    Lack of Transportation (Non-Medical): No  Physical Activity: Sufficiently Active (10/09/2023)   Exercise Vital Sign    Days of Exercise per Week: 3 days    Minutes of Exercise per Session: 60 min  Stress: No Stress Concern Present (10/09/2023)   [employer] of Occupational Health - Occupational Stress Questionnaire    Feeling of Stress: Only a little  Social Connections: Socially Integrated (10/09/2023)   Social Connection and Isolation Panel    Frequency of Communication with Friends and Family: More than three times a week    Frequency of Social Gatherings with Friends and Family: More than three times a week    Attends Religious Services: More than 4 times per year    Active Member of [employer] or Organizations: Yes    Attends Engineer, Structural: More than 4 times per year    Marital Status: Married    MEDICATIONS:  Current Outpatient Medications  Medication Sig Dispense Refill   acetaminophen  (TYLENOL ) 650 MG CR tablet Take 1,300 mg by mouth 2 (two) times daily.     albuterol  (VENTOLIN  HFA) 108 (90 Base) MCG/ACT inhaler INHALE 1 PUFF INTO THE LUNGS EVERY 4 HOURS AS NEEDED FOR WHEEZING 1 each 3   amantadine  (SYMMETREL ) 100 MG capsule Take 100 mg by mouth 2 (two) times daily.     Ascorbic Acid  (VITAMIN C  PO) Take 1,000 mg by mouth daily.     aspirin  81 MG chewable tablet Chew 1 tablet (81 mg total) by mouth daily. 90 tablet 0   atorvastatin  (LIPITOR ) 40 MG tablet TAKE 1 TABLET BY MOUTH ONCE DAILY 90 tablet 3   B Complex Vitamins (VITAMIN B COMPLEX PO) Take 1 tablet by mouth  every other day.     Blood Glucose Monitoring Suppl (ONETOUCH VERIO REFLECT) w/Device KIT Use to check blood sugar 3 times a day 1 kit 0   Calcium  Carb-Cholecalciferol  (CALTRATE 600+D3 PO) Take 1 tablet by mouth daily. 300 mg Vitamin D3     Cholecalciferol  (VITAMIN D3 PO) Take 1 tablet by mouth daily.     cyclobenzaprine  (FLEXERIL ) 5 MG tablet TAKE 1 TABLET BY MOUTH EVERY 8 HOURS AS NEEDED FOR MUSCLE SPASMS (BACK PAIN) 30 tablet 1   dexmethylphenidate (FOCALIN) 10 MG tablet Take by mouth. Taking 20 mg at breakfast and 10 mg at noon     diazepam  (VALIUM ) 10 MG tablet Take 2.5-7.5 mg by mouth daily as needed for anxiety.     docusate sodium  (COLACE) 100 MG capsule  Take 100-300 mg by mouth daily as needed for mild constipation or moderate constipation.     empagliflozin  (JARDIANCE ) 25 MG TABS tablet Take 1 tablet (25 mg total) by mouth daily. 30 tablet 11   Evolocumab  (REPATHA  SURECLICK) 140 MG/ML SOAJ INJECT 140MG  SUBCUTANEOUSLY EVERY 2 WEEKS 6 mL 3   famotidine  (PEPCID ) 40 MG tablet Take 40 mg by mouth 2 (two) times daily. 30 min. Before meal and at bedtime     fluticasone  (FLONASE ) 50 MCG/ACT nasal spray Place 1 spray into both nostrils daily. 16 g 6   gabapentin  (NEURONTIN ) 300 MG capsule TAKE 1 CAPSULE BY MOUTH 3 TIMES DAILY 270 capsule 1   glucose blood (ONETOUCH VERIO) test strip Use to check blood sugar 3 times a day 300 each 12   ketoconazole  (NIZORAL ) 2 % cream Apply to affected areas one to two times daily as needed. For seborrheic dermatitis at face 30 g 11   lamoTRIgine  (LAMICTAL ) 200 MG tablet Take 400 mg by mouth daily.     Lancets (ONETOUCH DELICA PLUS LANCET33G) MISC Use to check blood sugar 3 times a day. 300 each 12   latanoprost  (XALATAN ) 0.005 % ophthalmic solution Place 1 drop into both eyes 2 (two) times a week.      levothyroxine  (LEVOXYL ) 200 MCG tablet Take 1 tablet (200 mcg total) by mouth daily before breakfast. 90 tablet 3   loperamide  (IMODIUM  A-D) 2 MG tablet Take 2 mg  by mouth daily as needed for diarrhea or loose stools.     Melatonin 10 MG CAPS Take 10 mg by mouth at bedtime.     mesalamine  (LIALDA ) 1.2 g EC tablet Take 2 tablets (2.4 g total) by mouth daily. Office visit for further refills 180 tablet 0   Meth-Hyo-M Bl-Na Phos-Ph Sal (URO-MP ) 118 MG CAPS Take one pill by mouth four times daily as needed for urinary discomfort 60 capsule 0   metoprolol  tartrate (LOPRESSOR ) 25 MG tablet Take 1 tablet (25 mg total) by mouth 2 (two) times daily as needed (for tachycardia). 60 tablet 3   Multiple Vitamin (MULTIVITAMIN) tablet Take 1 tablet by mouth daily. Woman 50 plus     nitroGLYCERIN  (NITROSTAT ) 0.4 MG SL tablet Place 1 tablet (0.4 mg total) under the tongue every 5 (five) minutes as needed for chest pain. 25 tablet 3   ondansetron  (ZOFRAN -ODT) 4 MG disintegrating tablet DISSOLVE 1 TABLET ON THE TONGUE EVERY 6 HOURS AS NEEDED FOR NAUSEA OR VOMIT 90 tablet 1   pantoprazole  (PROTONIX ) 40 MG tablet TAKE 1 TABLET BY MOUTH TWICE DAILY 180 tablet 1   polyethylene glycol (MIRALAX  / GLYCOLAX ) 17 g packet Take 17 g by mouth 3 (three) times daily as needed for moderate constipation, mild constipation or severe constipation. In water  or beverage     Probiotic Product (CULTURELLE PROBIOTICS PO) Take 1 capsule by mouth daily. For woman     QUEtiapine  (SEROQUEL  XR) 400 MG 24 hr tablet Take 800 mg by mouth at bedtime.     simethicone  (MYLICON) 80 MG chewable tablet Chew 80 mg by mouth 4 (four) times daily.     sodium phosphate  (FLEET) ENEM Place 133 mLs (1 enema total) rectally daily as needed for up to 2 doses for severe constipation. 133 mL 0   tirzepatide  (MOUNJARO ) 15 MG/0.5ML Pen Inject 15 mg into the skin once a week. 6 mL 3   valACYclovir  (VALTREX ) 1000 MG tablet Take 0.5 tablets (500 mg total) by mouth daily. 45 tablet 1  VRAYLAR  capsule Take 3 mg by mouth at bedtime.     zaleplon (SONATA) 10 MG capsule Take 10-20 mg by mouth at bedtime.     No current  facility-administered medications for this visit.    PHYSICAL EXAM: Vitals:   11/11/23 1324  BP: 126/80  Pulse: 93  Resp: 16  SpO2: 99%  Weight: 138 lb 3.2 oz (62.7 kg)  Height: 5' 6.5 (1.689 m)     Body mass index is 21.97 kg/m.  Wt Readings from Last 3 Encounters:  11/11/23 138 lb 3.2 oz (62.7 kg)  10/23/23 137 lb (62.1 kg)  10/15/23 137 lb 6 oz (62.3 kg)    General: Well developed, well nourished female in no apparent distress.  HEENT: AT/[place], no external lesions.  Eyes: Conjunctiva clear and no icterus. Neck: Neck supple  Lungs: Respirations not labored Neurologic: Alert, oriented, normal speech Extremities / Skin: Dry.  Psychiatric: Does not appear depressed or anxious  Diabetic Foot Exam - Simple   No data filed     LABS Reviewed Lab Results  Component Value Date   HGBA1C 5.2 11/11/2023   HGBA1C 4.9 07/10/2023   HGBA1C 4.8 04/07/2023   Lab Results  Component Value Date   FRUCTOSAMINE 260 02/07/2020   FRUCTOSAMINE 220 01/25/2015   Lab Results  Component Value Date   CHOL 122 05/09/2023   HDL 55.50 05/09/2023   LDLCALC 34 05/09/2023   LDLDIRECT 84.0 12/04/2021   TRIG 162.0 (H) 05/09/2023   CHOLHDL 2 05/09/2023   Lab Results  Component Value Date   MICRALBCREAT 19 04/07/2023   MICRALBCREAT 20.8 11/10/2015   Lab Results  Component Value Date   CREATININE 0.97 10/23/2023   Lab Results  Component Value Date   GFR 89.62 05/09/2023    ASSESSMENT / PLAN  1. Controlled type 2 diabetes mellitus with complication, without long-term current use of insulin  (HCC)   2. Postablative hypothyroidism   3. Mixed hyperlipidemia      Diabetes Mellitus type 2, complicated by CAD. - Diabetic status / severity: Controlled.  Lab Results  Component Value Date   HGBA1C 5.2 11/11/2023    - Hemoglobin A1c goal : <6.5%  Diabetes has remained controlled off of insulin .  - Medications:  Diabetes regimen: No change. Continue Mounjaro  15 mg  weekly. Continue Jardiance  25 mg daily.  Advised for well hydration.  - Home glucose testing: Check at least few times a week.  - Discussed/ Gave Hypoglycemia treatment plan.  # Consult : not required at this time.   # Annual urine for microalbuminuria/ creatinine ratio, no microalbuminuria currently.    Last  Lab Results  Component Value Date   MICRALBCREAT 19 04/07/2023    # Foot check nightly.  # Annual dilated diabetic eye exams.  Advised to have diabetic eye exam.  - Diet: Make healthy diabetic food choices - Life style / activity / exercise: Discussed.  2. Blood pressure  -  BP Readings from Last 1 Encounters:  11/11/23 126/80    - Control is in target.  - No change in current plans.  3. Lipid status / Hyperlipidemia - Last  Lab Results  Component Value Date   LDLCALC 34 05/09/2023   - Continue atorvastatin  40 mg daily and Repatha .  Managed by cardiology.  Patient reports Repatha  was declined, advised to talk with cardiology for ongoing treatment including with Repatha .  # Postablative hypothyroidism - Continue current dose of levothyroxine /Levoxyl  200 mcg daily.   Diagnoses and all orders for  this visit:  Controlled type 2 diabetes mellitus with complication, without long-term current use of insulin  (HCC) -     Basic metabolic panel with GFR -     Hemoglobin A1c -     Microalbumin / creatinine urine ratio -     POCT glycosylated hemoglobin (Hb A1C)  Postablative hypothyroidism -     T4, free -     TSH  Mixed hyperlipidemia   DISPOSITION Follow up in clinic in 4 months suggested.  Labs prior to follow-up visit as ordered.   All questions answered and patient verbalized understanding of the plan.  Chai Verdejo, MD [facility] Endocrinology [facility] [address] [place], [address] [place], [place] [address] Phone # [phone]  At least part of this note was generated using voice recognition software. Inadvertent word errors may have  occurred, which were not recognized during the proofreading process.

## 2023-11-12 ENCOUNTER — Telehealth: Payer: Self-pay

## 2023-11-12 ENCOUNTER — Other Ambulatory Visit (HOSPITAL_COMMUNITY): Payer: Self-pay

## 2023-11-12 NOTE — Telephone Encounter (Signed)
 Pharmacy Patient Advocate Encounter   Received notification from CoverMyMeds that prior authorization for Ondansetron  4MG  dispersible tablets is required/requested.   Insurance verification completed.   The patient is insured through [facility] ADVANTAGE/RX ADVANCE.   Per test claim: PA required; PA submitted to above mentioned insurance via Latent Key/confirmation #/EOC [id number] Status is pending

## 2023-11-13 ENCOUNTER — Ambulatory Visit
Admission: RE | Admit: 2023-11-13 | Discharge: 2023-11-13 | Disposition: A | Discharge status: Home / Self Care | Source: Ambulatory Visit | Attending: Emergency Medicine | Admitting: Emergency Medicine

## 2023-11-13 ENCOUNTER — Other Ambulatory Visit: Payer: Self-pay | Admitting: Family Medicine

## 2023-11-13 VITALS — BP 145/88 | HR 101 | Temp 98.2°F | Resp 18

## 2023-11-13 DIAGNOSIS — S90821A Blister (nonthermal), right foot, initial encounter: Secondary | ICD-10-CM

## 2023-11-13 DIAGNOSIS — L089 Local infection of the skin and subcutaneous tissue, unspecified: Secondary | ICD-10-CM

## 2023-11-13 MED ORDER — CEPHALEXIN 500 MG PO CAPS: 500.0000 mg | ORAL_CAPSULE | Freq: Three times a day (TID) | ORAL | 0 refills | Status: AC

## 2023-11-13 NOTE — ED Triage Notes (Signed)
 Patient reports blister to right heal. Patient complains of redness, and swelling that started Saturday. Patient has used epsom salt soaks, and  neosporin with mild relief. Patient is a diabetic. Denies pain at this time.

## 2023-11-13 NOTE — Discharge Instructions (Signed)
 Today you are evaluated for the blistering to your heel which is concerning for infection as you have seen puslike drainage  Clean over the affected area with unscented soap and water  at least once a day with, easiest to do during normal hygiene, pat and do not rub  Cover with a nonstick bandage or callus pad to prevent further irritation and rubbing from clothing and shoes  Take cephalexin  every 8 hours for 5 days for treatment of bacteria  May complete Epson soaks or apply ice or heat packs to the affected area in 10 to 15-minute intervals  If symptoms continue to persist please follow-up for reevaluation  If you do not see improvement after 3 days of medicine use please follow-up for reevaluate

## 2023-11-13 NOTE — Telephone Encounter (Signed)
 Pharmacy Patient Advocate Encounter  Received notification from [facility] ADVANTAGE/RX ADVANCE that Prior Authorization for Ondansetron  4MG  dispersible tablets has been DENIED.  Full denial letter will be uploaded to the media tab. See denial reason below.  We denied this request under Medicare Part D because: Drug not prescribed for a medically accepted indication  Part D sponsors are responsible for ensuring that covered Part D drugs are being prescribed for medically-accepted indications (those approved by the Food and Drug Administration (FDA) or those supported by other Medicare-approved references; for a list of Medicare-approved references, please refer to the compendia described in section 1927(g)(1)(B)(I) of the Social Security Act).  Additionally, the requested drug must meet the following therapeutic criteria: diagnosis of chemotherapy or radiation induced nausea/vomiting or prophylaxis of post-surgical nausea/vomiting, before induction of anesthesia or shortly after surgery  PA #/Case ID/Reference #: [id number]

## 2023-11-13 NOTE — ED Provider Notes (Signed)
 Lindsay Fry    CSN: [id number] Arrival date & time: 11/13/23  1029      History   Chief Complaint Chief Complaint  Patient presents with   Blister    Blister from shoe slipping up and down in shoe Sat 11/08/23. I was unaware it was a bursted blister, while still wearing shoe.Increased signs of inflammation, redness, a bit of swelling. - Entered by patient    HPI Lindsay Fry is a 63 y.o. female.   Patient presents for evaluation of a blister to the right heel beginning 5 days ago after friction from shoes.  Endorses that yesterday evening she noticed significant swelling, redness and yellow puslike drainage.  Endorses was swollen to the size of a baseball.  Cleaned area with soap and water  and applied Neosporin and soaked in Epsom salt and symptoms did improve.  Initially felt pain which has resolved.  Past Medical History:  Diagnosis Date   ADD (attention deficit disorder) 11/19/2012   Allergic rhinitis, cause unspecified    Anxiety state, unspecified    Arthritis    Asymptomatic postmenopausal status (age-related) (natural)    Atypical hyperplasia of left breast 2000   Atypical mole 03/16/2018   R paraspinal mid upper back, excision   Atypical mole 02/17/2018   R spinal upper back, moderate   Atypical mole 08/11/2017   L sternum, excision   Atypical mole 05/27/2017   L post neck, moderate   Benign neoplasm of other and unspecified site of the digestive system    Bipolar disorder (HCC)    Breast cancer (HCC)    Left Breast 6mm invasive CA left uiq, dcis left uoq and a lot ADH, ALH in both breasts.   CAD (coronary artery disease)    a. 08/2014 NSTEMI/Cath: LM nl, LAD 50p, D1/D2 min irregs, LCX nl, OM1 40, LPDA nl, RI 99ost (2.32mm vessel->med Rx), RCA nl, AM 60, nl EF; b. 11/2016 MV: EF 74%, no ischemia (performed 2/2 dyspnea & new inf TWI).   Carcinoma of left breast (HCC) 06/02/2014   Cataract    Chronic diastolic CHF (congestive heart failure) (HCC)     a. 08/2014 Echo: EF 60-65%, Gr 1 DD, nl LA.   Clotting disorder    on blood thiner, Plavix    Cyst    on Achilles tendon   Depression    Diabetes mellitus (HCC)    frank   Dysuria    Edema    Family history of malignant neoplasm of gastrointestinal tract    Fibromyalgia    GERD (gastroesophageal reflux disease)    Glaucoma    pre glaucoma, 05/14/2018 pt. denies glaucoma   Headache    migraines   Hiatal hernia    History of alcoholism (HCC)    History of migraines    History of ovarian cyst    Hx of adenomatous colonic polyps    Insomnia, unspecified    Myocardial infarction (HCC) 09-04-14   OCD (obsessive compulsive disorder)    Osteopenia 09/25/2016   Femoral neck T -1.1  9/18   Other screening mammogram    Pure hypercholesterolemia    Rosacea    Subacute confusional state 11/19/2012   Tubular adenoma of colon 01/10/11   Ulcerative colitis, unspecified    Unspecified asthma(493.90)    Unspecified essential hypertension    Unspecified hypothyroidism    Unspecified vitamin D  deficiency    Vertigo     Patient Active Problem List   Diagnosis Date Noted  Erythrocytosis 09/16/2023   Bacterial conjunctivitis of right eye 05/13/2023   Encounter for gynecological examination 05/12/2023   Generalized body aches 04/18/2023   Shoulder pain, right 08/07/2022   Ulcerative (chronic) pancolitis without complications (HCC) 04/29/2022   Stress reaction 10/29/2021   Bipolar 2 disorder (HCC) 04/17/2021   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   Elevated transaminase level 03/27/2020   Current use of proton pump inhibitor 03/19/2020   Hypokalemia 03/07/2020   Aortic atherosclerosis 03/07/2020   MCI (mild cognitive impairment) 08/05/2019   Poor balance 12/16/2018   Fatigue 12/16/2018   Migraine 11/12/2018   Cervical radiculopathy due to degenerative joint disease of spine 10/28/2017   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   Osteopenia 09/25/2016   Estrogen  deficiency 05/30/2016   Dysuria 03/19/2016   Chronic constipation 01/18/2015   NSTEMI (non-ST elevated myocardial infarction) (HCC) 09/06/2014   Atherosclerosis of native coronary artery of native heart with angina pectoris 09/06/2014   DM (diabetes mellitus) (HCC) 09/05/2014   History of breast cancer 06/02/2014   ADD (attention deficit disorder) 11/19/2012   Low back pain 08/05/2012   Hypothyroid 05/18/2012   Chronic sinusitis 01/24/2012   Asymptomatic postmenopausal status 06/22/2008   Vitamin D  deficiency 03/18/2008   BENIGN NEOPLASM OTH&UNSPEC SITE DIGESTIVE SYSTEM 01/09/2007   Diaphragmatic hernia 01/09/2007   History of colonic polyps 01/09/2007   Hyperlipidemia associated with type 2 diabetes mellitus (HCC) 01/07/2007   Essential hypertension 01/07/2007   Allergic rhinitis 01/07/2007   Asthma 01/07/2007   GERD 01/07/2007   ACNE ROSACEA 01/07/2007   MIGRAINES, HX OF 01/07/2007   Fibromyalgia 10/30/2006   INSOMNIA 10/30/2006    Past Surgical History:  Procedure Laterality Date   APPENDECTOMY     AXILLARY SENTINEL NODE BIOPSY Left 05/23/2014   Procedure: AXILLARY SENTINEL NODE BIOPSY;  Surgeon: Louanne KANDICE Muse, MD;  Location: [facility];  Service: General;  Laterality: Left;   BREAST BIOPSY  08/1998   on tamoxifen, atypical hyperplasia   BREAST BIOPSY Left 04/2014   BREAST SURGERY Left 08/1998   lumpectomy/ Dr Rollene   BREAST SURGERY Bilateral 05/23/2014   Mastectomy   BUNIONECTOMY Right    CARDIAC CATHETERIZATION N/A 09/05/2014   Procedure: Left Heart Cath and Coronary Angiography;  Surgeon: Deatrice DELENA Cage, MD;  Location: [facility];  Service: Cardiovascular;  Laterality: N/A;   CARDIAC CATHETERIZATION  09/05/2014   CATARACT EXTRACTION W/ INTRAOCULAR LENS IMPLANT Bilateral    CESAREAN SECTION  1996/1997   placenta previa, gest DM, pre-eclampsia   CHOLECYSTECTOMY  1997   adhesions also   COLONOSCOPY  01/2001   Ulcerative colitis   COLONOSCOPY   04/2004   UC, polyp   COLONOSCOPY  12/2006   UC, no polyps   CORONARY STENT INTERVENTION N/A 04/06/2020   Procedure: CORONARY STENT INTERVENTION;  Surgeon: Mady Bruckner, MD;  Location: [facility];  Service: Cardiovascular;  Laterality: N/A;   DEXA  04/1999 and 2010   normal   ESOPHAGOGASTRODUODENOSCOPY  09/2001   polyp   exercise stress test  11/2003   negative   FEMUR FRACTURE SURGERY Left    LEFT HEART CATH AND CORONARY ANGIOGRAPHY N/A 04/06/2020   Procedure: LEFT HEART CATH AND CORONARY ANGIOGRAPHY;  Surgeon: Perla Evalene PARAS, MD;  Location: [facility];  Service: Cardiovascular;  Laterality: N/A;   MASTECTOMY Bilateral    MECKEL DIVERTICULUM EXCISION  1999   NASAL SINUS SURGERY  05/1997   nuclear stress test  06/2007   negative  precancerous mole removed     RIGHT AND LEFT HEART CATH     with 1 stent placed in March 2022   SIMPLE MASTECTOMY WITH AXILLARY SENTINEL NODE BIOPSY Bilateral 05/23/2014   Procedure: Bilateral simple mastectomy, left sentinel node biopsy ;  Surgeon: Louanne KANDICE Muse, MD;  Location: [facility];  Service: General;  Laterality: Bilateral;   Sleep study  11/2007   no apnea, but did snore (done by [facility])   TONSILLECTOMY  1984   TUBAL LIGATION     WISDOM TOOTH EXTRACTION  1980's    OB History     Gravida  2   Para  2   Term      Preterm      AB      Living         SAB      IAB      Ectopic      Multiple      Live Births           Obstetric Comments  1st Menstrual Cycle:  11  1st Pregnancy:  33          Home Medications    Prior to Admission medications   Medication Sig Start Date End Date Taking? Authorizing Provider  cephALEXin  (KEFLEX ) 500 MG capsule Take 1 capsule (500 mg total) by mouth 3 (three) times daily for 5 days. 11/13/23 11/18/23 Yes Genecis Veley R, NP  acetaminophen  (TYLENOL ) 650 MG CR tablet Take 1,300 mg by mouth 2 (two) times daily.    [provider]  albuterol   (VENTOLIN  HFA) 108 (90 Base) MCG/ACT inhaler INHALE 1 PUFF INTO THE LUNGS EVERY 4 HOURS AS NEEDED FOR WHEEZING 05/05/23   Tower, Laine LABOR, MD  amantadine  (SYMMETREL ) 100 MG capsule Take 100 mg by mouth 2 (two) times daily.    [provider]  Ascorbic Acid  (VITAMIN C  PO) Take 1,000 mg by mouth daily.    [provider]  aspirin  81 MG chewable tablet Chew 1 tablet (81 mg total) by mouth daily. 06/04/21   Furth, Cadence H, PA-C  atorvastatin  (LIPITOR ) 40 MG tablet TAKE 1 TABLET BY MOUTH ONCE DAILY 12/02/22   Gollan, Timothy J, MD  B Complex Vitamins (VITAMIN B COMPLEX PO) Take 1 tablet by mouth every other day.    [provider]  Blood Glucose Monitoring Suppl (ONETOUCH VERIO REFLECT) w/Device KIT Use to check blood sugar 3 times a day 10/01/23   Thapa, Sudan, MD  Calcium  Carb-Cholecalciferol  (CALTRATE 600+D3 PO) Take 1 tablet by mouth daily. 300 mg Vitamin D3    [provider]  Cholecalciferol  (VITAMIN D3 PO) Take 1 tablet by mouth daily.    [provider]  cyclobenzaprine  (FLEXERIL ) 5 MG tablet TAKE 1 TABLET BY MOUTH EVERY 8 HOURS AS NEEDED FOR MUSCLE SPASMS (BACK PAIN) 10/03/22   Tower, Laine LABOR, MD  dexmethylphenidate (FOCALIN) 10 MG tablet Take by mouth. Taking 20 mg at breakfast and 10 mg at noon 07/22/23   [provider]  diazepam  (VALIUM ) 10 MG tablet Take 2.5-7.5 mg by mouth daily as needed for anxiety.    [provider]  docusate sodium  (COLACE) 100 MG capsule Take 100-300 mg by mouth daily as needed for mild constipation or moderate constipation.    [provider]  empagliflozin  (JARDIANCE ) 25 MG TABS tablet Take 1 tablet (25 mg total) by mouth daily. 10/01/23   Thapa, Sudan, MD  Evolocumab  (REPATHA  SURECLICK) 140 MG/ML SOAJ INJECT 140MG   SUBCUTANEOUSLY EVERY 2 WEEKS 05/09/23   Gollan, Timothy J, MD  famotidine  (PEPCID ) 40 MG tablet Take 40 mg by mouth 2 (two) times daily. 30 min. Before meal and at bedtime    [provider]  fluticasone  (FLONASE ) 50 MCG/ACT nasal spray Place 1 spray into both nostrils daily. 12/05/20   Dugal, [name], FNP  gabapentin  (NEURONTIN ) 300 MG capsule TAKE 1 CAPSULE BY MOUTH 3 TIMES DAILY 04/25/23   Tower, Lenexa A, MD  glucose blood (ONETOUCH VERIO) test strip Use to check blood sugar 3 times a day 10/01/23   Thapa, Sudan, MD  ketoconazole  (NIZORAL ) 2 % cream Apply to affected areas one to two times daily as needed. For seborrheic dermatitis at face 10/07/23   Jackquline Sawyer, MD  lamoTRIgine  (LAMICTAL ) 200 MG tablet Take 400 mg by mouth daily.    [provider]  Lancets [facility] DELICA PLUS LANCET33G) MISC Use to check blood sugar 3 times a day. 10/01/23   Thapa, Sudan, MD  latanoprost  (XALATAN ) 0.005 % ophthalmic solution Place 1 drop into both eyes 2 (two) times a week.  10/12/10   [provider]  levothyroxine  (LEVOXYL ) 200 MCG tablet Take 1 tablet (200 mcg total) by mouth daily before breakfast. 10/10/23   Thapa, Sudan, MD  loperamide  (IMODIUM  A-D) 2 MG tablet Take 2 mg by mouth daily as needed for diarrhea or loose stools.    [provider]  Melatonin 10 MG CAPS Take 10 mg by mouth at bedtime.    [provider]  mesalamine  (LIALDA ) 1.2 g EC tablet Take 2 tablets (2.4 g total) by mouth daily. Office visit for further refills 04/11/23   Abran Norleen SAILOR, MD  Meth-Hyo-M Starlene Phos-Ph Sal (URO-MP ) 118 MG CAPS Take one pill by mouth four times daily as needed for urinary discomfort 08/18/23   Tower, Laine LABOR, MD  metoprolol  tartrate (LOPRESSOR ) 25 MG tablet Take 1 tablet (25 mg total) by mouth 2 (two) times daily as needed (for tachycardia). 09/09/22   Gollan, Timothy J, MD  Multiple Vitamin (MULTIVITAMIN) tablet Take 1 tablet by mouth daily. Woman 50 plus    [provider]  nitroGLYCERIN  (NITROSTAT ) 0.4 MG SL tablet Place 1 tablet (0.4 mg total) under the tongue every 5 (five) minutes as needed for chest pain. 09/03/21   Gollan, Timothy J, MD   ondansetron  (ZOFRAN -ODT) 4 MG disintegrating tablet DISSOLVE 1 TABLET ON THE TONGUE EVERY 6 HOURS AS NEEDED FOR NAUSEA OR VOMIT 08/21/23   Abran Norleen SAILOR, MD  pantoprazole  (PROTONIX ) 40 MG tablet TAKE 1 TABLET BY MOUTH TWICE DAILY 10/21/23   Perry, John N, MD  polyethylene glycol (MIRALAX  / GLYCOLAX ) 17 g packet Take 17 g by mouth 3 (three) times daily as needed for moderate constipation, mild constipation or severe constipation. In water  or beverage    [provider]  Probiotic Product (CULTURELLE PROBIOTICS PO) Take 1 capsule by mouth daily. For woman    [provider]  QUEtiapine  (SEROQUEL  XR) 400 MG 24 hr tablet Take 800 mg by mouth at bedtime.    [provider]  simethicone  (MYLICON) 80 MG chewable tablet Chew 80 mg by mouth 4 (four) times daily.    [provider]  sodium phosphate  (FLEET) ENEM Place 133 mLs (1 enema total) rectally daily as needed for up to 2 doses for severe constipation. 10/23/23   Nicholaus Rolland BRAVO, MD  tirzepatide  (MOUNJARO ) 15 MG/0.5ML Pen Inject 15 mg into the skin once a week. 10/01/23  Thapa, Sudan, MD  valACYclovir  (VALTREX ) 1000 MG tablet Take 0.5 tablets (500 mg total) by mouth daily. 09/16/23   Tower, Laine LABOR, MD  VRAYLAR  capsule Take 3 mg by mouth at bedtime. 02/28/20   [provider]  zaleplon (SONATA) 10 MG capsule Take 10-20 mg by mouth at bedtime.    [provider]    Family History Family History  Problem Relation Age of Onset   Coronary artery disease Father    Colon cancer Father    Alzheimer's disease Father    Dementia Father    Colon polyps Father    Coronary artery disease Mother    Hypertension Mother    Osteoporosis Mother    Crohn's disease Mother        crohns colitis   Breast cancer Other        great aunts   Coronary artery disease Other        Uncle (also AAA)   Diabetes Other        remote family history   Stroke Cousin    Esophageal cancer Neg Hx    Rectal cancer Neg Hx     Stomach cancer Neg Hx     Social History Social History   Tobacco Use   Smoking status: Former    Current packs/day: 0.00    Average packs/day: 0.3 packs/day for 5.0 years (1.3 ttl pk-yrs)    Types: Cigarettes    Start date: 01/15/1988    Quit date: 01/14/1993    Years since quitting: 30.8   Smokeless tobacco: Never  Vaping Use   Vaping status: Never Used  Substance Use Topics   Alcohol use: No    Alcohol/week: 0.0 standard drinks of alcohol    Comment: Recovered ETOH   Drug use: No     Allergies   Ephedrine, Oxycodone , Aspirin , Erythromycin, Nsaids, and Rosuvastatin   Review of Systems Review of Systems   Physical Exam Triage Vital Signs ED Triage Vitals  Encounter Vitals Group     BP 11/13/23 1059 (!) 145/88     Girls Systolic BP Percentile --      Girls Diastolic BP Percentile --      Boys Systolic BP Percentile --      Boys Diastolic BP Percentile --      Pulse Rate 11/13/23 1059 (!) 101     Resp 11/13/23 1059 18     Temp 11/13/23 1059 98.2 F (36.8 C)     Temp Source 11/13/23 1059 Oral     SpO2 11/13/23 1059 99 %     Weight --      Height --      Head Circumference --      Peak Flow --      Pain Score 11/13/23 1057 0     Pain Loc --      Pain Education --      Exclude from Growth Chart --    No data found.  Updated Vital Signs BP (!) 145/88 (BP Location: Right Arm)   Pulse (!) 101   Temp 98.2 F (36.8 C) (Oral)   Resp 18   LMP 08/23/2006 (Approximate) Comment: tubal ligation  SpO2 99%   Visual Acuity Right Eye Distance:   Left Eye Distance:   Bilateral Distance:    Right Eye Near:   Left Eye Near:    Bilateral Near:     Physical Exam Constitutional:      Appearance: Normal appearance.  Eyes:  Extraocular Movements: Extraocular movements intact.  Pulmonary:     Effort: Pulmonary effort is normal.  Neurological:     Mental Status: She is alert and oriented to person, place, and time. Mental status is at baseline.      UC  Treatments / Results  Labs (all labs ordered are listed, but only abnormal results are displayed) Labs Reviewed - No data to display  EKG   Radiology No results found.  Procedures Procedures (including critical care time)  Medications Ordered in UC Medications - No data to display  Initial Impression / Assessment and Plan / UC Course  I have reviewed the triage vital signs and the nursing notes.  Pertinent labs & imaging results that were available during my care of the patient were reviewed by me and considered in my medical decision making (see chart for details).  Infected blister of heel, right, initial encounter  Presentation concerning for infection as there is significant swelling.  Edema patient also endorsing purulent drainage, prescribed cephalexin  and recommended daily cleansing and covering with a nonadherent dressing and callus padding to prevent further irritation, recommended supportive care through heat ice, warm soaks and over the counter analgesics as needed for pain, advised follow-up if no improvement seen within 3 days Final Clinical Impressions(s) / UC Diagnoses   Final diagnoses:  Infected blister of heel, right, initial encounter     Discharge Instructions      Today you are evaluated for the blistering to your heel which is concerning for infection as you have seen puslike drainage  Clean over the affected area with unscented soap and water  at least once a day with, easiest to do during normal hygiene, pat and do not rub  Cover with a nonstick bandage or callus pad to prevent further irritation and rubbing from clothing and shoes  Take cephalexin  every 8 hours for 5 days for treatment of bacteria  May complete Epson soaks or apply ice or heat packs to the affected area in 10 to 15-minute intervals  If symptoms continue to persist please follow-up for reevaluation  If you do not see improvement after 3 days of medicine use please follow-up for  reevaluate   ED Prescriptions     Medication Sig Dispense Auth. Provider   cephALEXin  (KEFLEX ) 500 MG capsule Take 1 capsule (500 mg total) by mouth 3 (three) times daily for 5 days. 15 capsule Travious Vanover R, NP      PDMP not reviewed this encounter.   Teresa Shelba SAUNDERS, NP 11/13/23 1115

## 2023-11-14 NOTE — Telephone Encounter (Signed)
 Last filled on 04/25/23 #270 caps/ 1 refill  Last OV was on 08/18/23 for dysuria, no future appts

## 2023-11-19 DIAGNOSIS — F901 Attention-deficit hyperactivity disorder, predominantly hyperactive type: Secondary | ICD-10-CM | POA: Diagnosis not present

## 2023-11-19 DIAGNOSIS — F3112 Bipolar disorder, current episode manic without psychotic features, moderate: Secondary | ICD-10-CM | POA: Diagnosis not present

## 2023-11-19 DIAGNOSIS — F411 Generalized anxiety disorder: Secondary | ICD-10-CM | POA: Diagnosis not present

## 2023-11-19 DIAGNOSIS — F3173 Bipolar disorder, in partial remission, most recent episode manic: Secondary | ICD-10-CM | POA: Diagnosis not present

## 2023-12-03 DIAGNOSIS — F411 Generalized anxiety disorder: Secondary | ICD-10-CM | POA: Diagnosis not present

## 2023-12-03 DIAGNOSIS — F3173 Bipolar disorder, in partial remission, most recent episode manic: Secondary | ICD-10-CM | POA: Diagnosis not present

## 2023-12-03 DIAGNOSIS — F901 Attention-deficit hyperactivity disorder, predominantly hyperactive type: Secondary | ICD-10-CM | POA: Diagnosis not present

## 2023-12-03 DIAGNOSIS — F3112 Bipolar disorder, current episode manic without psychotic features, moderate: Secondary | ICD-10-CM | POA: Diagnosis not present

## 2023-12-13 ENCOUNTER — Telehealth: Admitting: Nurse Practitioner

## 2023-12-13 DIAGNOSIS — R42 Dizziness and giddiness: Secondary | ICD-10-CM | POA: Diagnosis not present

## 2023-12-13 MED ORDER — MECLIZINE HCL 25 MG PO TABS
25.0000 mg | ORAL_TABLET | Freq: Three times a day (TID) | ORAL | 0 refills | Status: AC | PRN
Start: 2023-12-13 — End: ?

## 2023-12-13 NOTE — Patient Instructions (Signed)
 Lindsay Fry, thank you for joining Haze LELON Servant, NP for today's virtual visit.  While this provider is not your primary care provider (PCP), if your PCP is located in our provider database this encounter information will be shared with them immediately following your visit.   A [facility] MyChart account gives you access to today's visit and all your visits, tests, and labs performed at [facility]  click here if you don't have a [facility] MyChart account or go to [email or web address]  Consent: (Patient) Lindsay Fry provided verbal consent for this virtual visit at the beginning of the encounter.  Current Medications:  Current Outpatient Medications:    meclizine (ANTIVERT) 25 MG tablet, Take 1 tablet (25 mg total) by mouth 3 (three) times daily as needed for dizziness., Disp: 30 tablet, Rfl: 0   acetaminophen  (TYLENOL ) 650 MG CR tablet, Take 1,300 mg by mouth 2 (two) times daily., Disp: , Rfl:    albuterol  (VENTOLIN  HFA) 108 (90 Base) MCG/ACT inhaler, INHALE 1 PUFF INTO THE LUNGS EVERY 4 HOURS AS NEEDED FOR WHEEZING, Disp: 1 each, Rfl: 3   amantadine  (SYMMETREL ) 100 MG capsule, Take 100 mg by mouth 2 (two) times daily., Disp: , Rfl:    Ascorbic Acid  (VITAMIN C  PO), Take 1,000 mg by mouth daily., Disp: , Rfl:    aspirin  81 MG chewable tablet, Chew 1 tablet (81 mg total) by mouth daily., Disp: 90 tablet, Rfl: 0   atorvastatin  (LIPITOR ) 40 MG tablet, TAKE 1 TABLET BY MOUTH ONCE DAILY, Disp: 90 tablet, Rfl: 3   B Complex Vitamins (VITAMIN B COMPLEX PO), Take 1 tablet by mouth every other day., Disp: , Rfl:    Blood Glucose Monitoring Suppl (ONETOUCH VERIO REFLECT) w/Device KIT, Use to check blood sugar 3 times a day, Disp: 1 kit, Rfl: 0   Calcium  Carb-Cholecalciferol  (CALTRATE 600+D3 PO), Take 1 tablet by mouth daily. 300 mg Vitamin D3, Disp: , Rfl:    Cholecalciferol  (VITAMIN D3 PO), Take 1 tablet by mouth daily., Disp: , Rfl:    cyclobenzaprine  (FLEXERIL ) 5  MG tablet, TAKE 1 TABLET BY MOUTH EVERY 8 HOURS AS NEEDED FOR MUSCLE SPASMS (BACK PAIN), Disp: 30 tablet, Rfl: 1   dexmethylphenidate (FOCALIN) 10 MG tablet, Take by mouth. Taking 20 mg at breakfast and 10 mg at noon, Disp: , Rfl:    diazepam  (VALIUM ) 10 MG tablet, Take 2.5-7.5 mg by mouth daily as needed for anxiety., Disp: , Rfl:    docusate sodium  (COLACE) 100 MG capsule, Take 100-300 mg by mouth daily as needed for mild constipation or moderate constipation., Disp: , Rfl:    empagliflozin  (JARDIANCE ) 25 MG TABS tablet, Take 1 tablet (25 mg total) by mouth daily., Disp: 30 tablet, Rfl: 11   Evolocumab  (REPATHA  SURECLICK) 140 MG/ML SOAJ, INJECT 140MG  SUBCUTANEOUSLY EVERY 2 WEEKS, Disp: 6 mL, Rfl: 3   famotidine  (PEPCID ) 40 MG tablet, Take 40 mg by mouth 2 (two) times daily. 30 min. Before meal and at bedtime, Disp: , Rfl:    fluticasone  (FLONASE ) 50 MCG/ACT nasal spray, Place 1 spray into both nostrils daily., Disp: 16 g, Rfl: 6   gabapentin  (NEURONTIN ) 300 MG capsule, TAKE 1 CAPSULE BY MOUTH 3 TIMES DAILY, Disp: 270 capsule, Rfl: 1   glucose blood (ONETOUCH VERIO) test strip, Use to check blood sugar 3 times a day, Disp: 300 each, Rfl: 12   ketoconazole  (NIZORAL ) 2 % cream, Apply to affected areas one to two times daily as needed. For seborrheic  dermatitis at face, Disp: 30 g, Rfl: 11   lamoTRIgine  (LAMICTAL ) 200 MG tablet, Take 400 mg by mouth daily., Disp: , Rfl:    Lancets (ONETOUCH DELICA PLUS LANCET33G) MISC, Use to check blood sugar 3 times a day., Disp: 300 each, Rfl: 12   latanoprost  (XALATAN ) 0.005 % ophthalmic solution, Place 1 drop into both eyes 2 (two) times a week. , Disp: , Rfl:    levothyroxine  (LEVOXYL ) 200 MCG tablet, Take 1 tablet (200 mcg total) by mouth daily before breakfast., Disp: 90 tablet, Rfl: 3   loperamide  (IMODIUM  A-D) 2 MG tablet, Take 2 mg by mouth daily as needed for diarrhea or loose stools., Disp: , Rfl:    Melatonin 10 MG CAPS, Take 10 mg by mouth at bedtime.,  Disp: , Rfl:    mesalamine  (LIALDA ) 1.2 g EC tablet, Take 2 tablets (2.4 g total) by mouth daily. Office visit for further refills, Disp: 180 tablet, Rfl: 0   Meth-Hyo-M Bl-Na Phos-Ph Sal (URO-MP ) 118 MG CAPS, Take one pill by mouth four times daily as needed for urinary discomfort, Disp: 60 capsule, Rfl: 0   metoprolol  tartrate (LOPRESSOR ) 25 MG tablet, Take 1 tablet (25 mg total) by mouth 2 (two) times daily as needed (for tachycardia)., Disp: 60 tablet, Rfl: 3   Multiple Vitamin (MULTIVITAMIN) tablet, Take 1 tablet by mouth daily. Woman 50 plus, Disp: , Rfl:    nitroGLYCERIN  (NITROSTAT ) 0.4 MG SL tablet, Place 1 tablet (0.4 mg total) under the tongue every 5 (five) minutes as needed for chest pain., Disp: 25 tablet, Rfl: 3   ondansetron  (ZOFRAN -ODT) 4 MG disintegrating tablet, DISSOLVE 1 TABLET ON THE TONGUE EVERY 6 HOURS AS NEEDED FOR NAUSEA OR VOMIT, Disp: 90 tablet, Rfl: 1   pantoprazole  (PROTONIX ) 40 MG tablet, TAKE 1 TABLET BY MOUTH TWICE DAILY, Disp: 180 tablet, Rfl: 1   polyethylene glycol (MIRALAX  / GLYCOLAX ) 17 g packet, Take 17 g by mouth 3 (three) times daily as needed for moderate constipation, mild constipation or severe constipation. In water  or beverage, Disp: , Rfl:    Probiotic Product (CULTURELLE PROBIOTICS PO), Take 1 capsule by mouth daily. For woman, Disp: , Rfl:    QUEtiapine  (SEROQUEL  XR) 400 MG 24 hr tablet, Take 800 mg by mouth at bedtime., Disp: , Rfl:    simethicone  (MYLICON) 80 MG chewable tablet, Chew 80 mg by mouth 4 (four) times daily., Disp: , Rfl:    sodium phosphate  (FLEET) ENEM, Place 133 mLs (1 enema total) rectally daily as needed for up to 2 doses for severe constipation., Disp: 133 mL, Rfl: 0   tirzepatide  (MOUNJARO ) 15 MG/0.5ML Pen, Inject 15 mg into the skin once a week., Disp: 6 mL, Rfl: 3   valACYclovir  (VALTREX ) 1000 MG tablet, Take 0.5 tablets (500 mg total) by mouth daily., Disp: 45 tablet, Rfl: 1   VRAYLAR  capsule, Take 3 mg by mouth at bedtime., Disp:  , Rfl:    zaleplon (SONATA) 10 MG capsule, Take 10-20 mg by mouth at bedtime., Disp: , Rfl:    Medications ordered in this encounter:  Meds ordered this encounter  Medications   meclizine (ANTIVERT) 25 MG tablet    Sig: Take 1 tablet (25 mg total) by mouth 3 (three) times daily as needed for dizziness.    Dispense:  30 tablet    Refill:  0    Supervising Provider:   BLAISE ALEENE KIDD [phone]     *If you need refills on other medications prior to your  next appointment, please contact your pharmacy*  Follow-Up: Call back or seek an in-person evaluation if the symptoms worsen or if the condition fails to improve as anticipated.  [facility] [phone]  Other Instructions Symptoms of stroke discussed. She will go to hospital if symptoms worsen   If you have been instructed to have an in-person evaluation today at a local Urgent Care facility, please use the link below. It will take you to a list of all of our available [facility], including address, phone number and hours of operation. Please do not delay care.  [facility]  If you or a family member do not have a primary care provider, use the link below to schedule a visit and establish care. When you choose a [facility] primary care physician or advanced practice provider, you gain a long-term partner in health. Find a Primary Care Provider  Learn more about [facility]'s in-office and virtual care options: [facility] - Get Care Now

## 2023-12-13 NOTE — Progress Notes (Signed)
 Virtual Visit Consent   Lindsay Fry, you are scheduled for a virtual visit with a [facility] provider today. Just as with appointments in the office, your consent must be obtained to participate. Your consent will be active for this visit and any virtual visit you may have with one of our providers in the next 365 days. If you have a MyChart account, a copy of this consent can be sent to you electronically.  As this is a virtual visit, video technology does not allow for your provider to perform a traditional examination. This may limit your provider's ability to fully assess your condition. If your provider identifies any concerns that need to be evaluated in person or the need to arrange testing (such as labs, EKG, etc.), we will make arrangements to do so. Although advances in technology are sophisticated, we cannot ensure that it will always work on either your end or our end. If the connection with a video visit is poor, the visit may have to be switched to a telephone visit. With either a video or telephone visit, we are not always able to ensure that we have a secure connection.  By engaging in this virtual visit, you consent to the provision of healthcare and authorize for your insurance to be billed (if applicable) for the services provided during this visit. Depending on your insurance coverage, you may receive a charge related to this service.  I need to obtain your verbal consent now. Are you willing to proceed with your visit today? Lindsay Fry has provided verbal consent on 12/13/2023 for a virtual visit (video or telephone). Haze LELON Servant, NP  Date: 12/13/2023 1:16 PM   Virtual Visit via Video Note   I, Haze LELON Servant, connected with  Lindsay Fry  ([id number], 02/06/1960) on 12/13/23 at  1:00 PM EST by a video-enabled telemedicine application and verified that I am speaking with the correct person using two identifiers.  Location: Patient: Virtual Visit  Location Patient: Home Provider: Virtual Visit Location Provider: Home Office   I discussed the limitations of evaluation and management by telemedicine and the availability of in person appointments. The patient expressed understanding and agreed to proceed.    History of Present Illness: Lindsay Fry is a 63 y.o. who identifies as a female who was assigned female at birth, and is being seen today for dizziness.  Ms. Falkenstein has been experiencing dizziness for the past 3 days. She has been taking dramamine once a day and has a history of vertigo. States Wednesday was very bad in regard to the severity of her symptoms. Thursday and Friday symptoms seemed to lessen in intensity and today symptoms have increased again. She is only able to walk with assistance to keep her from falling. She has taken dramamine once today. Glucose 113. Blood pressure controlled per her report be has not been checked today. She denies unilateral weakness/hemiparesis, sudden vision changes, dysarthria, facial droop or confusion.    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   Elevated transaminase level 03/27/2020   Current use of proton pump inhibitor 03/19/2020  Hypokalemia 03/07/2020   Aortic atherosclerosis 03/07/2020   MCI (mild cognitive impairment) 08/05/2019   Poor balance 12/16/2018   Fatigue 12/16/2018   Migraine 11/12/2018   Cervical radiculopathy due to degenerative joint disease of spine 10/28/2017   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   Osteopenia 09/25/2016   Estrogen  deficiency 05/30/2016   Dysuria 03/19/2016   Chronic constipation 01/18/2015   NSTEMI (non-ST elevated myocardial infarction) (HCC) 09/06/2014   Atherosclerosis of native coronary artery of native heart with angina pectoris 09/06/2014   DM (diabetes mellitus) (HCC) 09/05/2014   History of breast cancer 06/02/2014   ADD (attention deficit disorder) 11/19/2012   Low back pain 08/05/2012   Hypothyroid 05/18/2012   Chronic sinusitis 01/24/2012   Asymptomatic postmenopausal status 06/22/2008   Vitamin D  deficiency 03/18/2008   BENIGN NEOPLASM OTH&UNSPEC SITE DIGESTIVE SYSTEM 01/09/2007   Diaphragmatic hernia 01/09/2007   History of colonic polyps 01/09/2007   Hyperlipidemia associated with type 2 diabetes mellitus (HCC) 01/07/2007   Essential hypertension 01/07/2007   Allergic rhinitis 01/07/2007   Asthma 01/07/2007   GERD 01/07/2007   ACNE ROSACEA 01/07/2007   MIGRAINES, HX OF 01/07/2007   Fibromyalgia 10/30/2006   INSOMNIA 10/30/2006    Allergies:  Allergies  Allergen Reactions   Ephedrine Other (See Comments)    Pt becomes hyper   Oxycodone  Other (See Comments)    Panic Attack   Aspirin  Other (See Comments)    REACTION: aggrivates colitis Tolerates 81 mg but unable to take doses >81 mg   Erythromycin Other (See Comments)    REACTION: GI upset   Nsaids     REACTION: aggrivate colitis- GI ok'd for short term use   Rosuvastatin Other (See Comments)    REACTION: increased lfts   Medications:  Current Outpatient Medications:    acetaminophen  (TYLENOL ) 650 MG CR tablet, Take 1,300 mg by mouth 2 (two) times daily., Disp: , Rfl:    albuterol  (VENTOLIN  HFA) 108 (90 Base) MCG/ACT inhaler, INHALE 1 PUFF INTO THE LUNGS EVERY 4 HOURS AS NEEDED FOR WHEEZING, Disp: 1 each, Rfl: 3   amantadine  (SYMMETREL ) 100 MG capsule, Take 100 mg by mouth 2 (two) times daily., Disp: , Rfl:    Ascorbic Acid  (VITAMIN C  PO), Take 1,000 mg by mouth daily., Disp: , Rfl:    aspirin  81 MG chewable tablet,  Chew 1 tablet (81 mg total) by mouth daily., Disp: 90 tablet, Rfl: 0   atorvastatin  (LIPITOR ) 40 MG tablet, TAKE 1 TABLET BY MOUTH ONCE DAILY, Disp: 90 tablet, Rfl: 3   B Complex Vitamins (VITAMIN B COMPLEX PO), Take 1 tablet by mouth every other day., Disp: , Rfl:    Blood Glucose Monitoring Suppl (ONETOUCH VERIO REFLECT) w/Device KIT, Use to check blood sugar 3 times a day, Disp: 1 kit, Rfl: 0   Calcium  Carb-Cholecalciferol  (CALTRATE 600+D3 PO), Take 1 tablet by mouth daily. 300 mg Vitamin D3, Disp: , Rfl:    Cholecalciferol  (VITAMIN D3 PO), Take 1 tablet by mouth daily., Disp: , Rfl:    cyclobenzaprine  (FLEXERIL ) 5 MG tablet, TAKE 1 TABLET BY MOUTH EVERY 8 HOURS AS NEEDED FOR MUSCLE SPASMS (BACK PAIN), Disp: 30 tablet, Rfl: 1   dexmethylphenidate (FOCALIN) 10 MG tablet, Take by mouth. Taking 20 mg at breakfast and 10 mg at noon, Disp: , Rfl:    diazepam  (VALIUM ) 10 MG tablet, Take 2.5-7.5 mg by mouth daily as needed for anxiety., Disp: , Rfl:    docusate sodium  (COLACE) 100 MG capsule, Take 100-300 mg by mouth  daily as needed for mild constipation or moderate constipation., Disp: , Rfl:    empagliflozin  (JARDIANCE ) 25 MG TABS tablet, Take 1 tablet (25 mg total) by mouth daily., Disp: 30 tablet, Rfl: 11   Evolocumab  (REPATHA  SURECLICK) 140 MG/ML SOAJ, INJECT 140MG  SUBCUTANEOUSLY EVERY 2 WEEKS, Disp: 6 mL, Rfl: 3   famotidine  (PEPCID ) 40 MG tablet, Take 40 mg by mouth 2 (two) times daily. 30 min. Before meal and at bedtime, Disp: , Rfl:    fluticasone  (FLONASE ) 50 MCG/ACT nasal spray, Place 1 spray into both nostrils daily., Disp: 16 g, Rfl: 6   gabapentin  (NEURONTIN ) 300 MG capsule, TAKE 1 CAPSULE BY MOUTH 3 TIMES DAILY, Disp: 270 capsule, Rfl: 1   glucose blood (ONETOUCH VERIO) test strip, Use to check blood sugar 3 times a day, Disp: 300 each, Rfl: 12   ketoconazole  (NIZORAL ) 2 % cream, Apply to affected areas one to two times daily as needed. For seborrheic dermatitis at face, Disp: 30 g, Rfl:  11   lamoTRIgine  (LAMICTAL ) 200 MG tablet, Take 400 mg by mouth daily., Disp: , Rfl:    Lancets (ONETOUCH DELICA PLUS LANCET33G) MISC, Use to check blood sugar 3 times a day., Disp: 300 each, Rfl: 12   latanoprost  (XALATAN ) 0.005 % ophthalmic solution, Place 1 drop into both eyes 2 (two) times a week. , Disp: , Rfl:    levothyroxine  (LEVOXYL ) 200 MCG tablet, Take 1 tablet (200 mcg total) by mouth daily before breakfast., Disp: 90 tablet, Rfl: 3   loperamide  (IMODIUM  A-D) 2 MG tablet, Take 2 mg by mouth daily as needed for diarrhea or loose stools., Disp: , Rfl:    meclizine (ANTIVERT) 25 MG tablet, Take 1 tablet (25 mg total) by mouth 3 (three) times daily as needed for dizziness., Disp: 30 tablet, Rfl: 0   Melatonin 10 MG CAPS, Take 10 mg by mouth at bedtime., Disp: , Rfl:    mesalamine  (LIALDA ) 1.2 g EC tablet, Take 2 tablets (2.4 g total) by mouth daily. Office visit for further refills, Disp: 180 tablet, Rfl: 0   Meth-Hyo-M Bl-Na Phos-Ph Sal (URO-MP ) 118 MG CAPS, Take one pill by mouth four times daily as needed for urinary discomfort, Disp: 60 capsule, Rfl: 0   metoprolol  tartrate (LOPRESSOR ) 25 MG tablet, Take 1 tablet (25 mg total) by mouth 2 (two) times daily as needed (for tachycardia)., Disp: 60 tablet, Rfl: 3   Multiple Vitamin (MULTIVITAMIN) tablet, Take 1 tablet by mouth daily. Woman 50 plus, Disp: , Rfl:    nitroGLYCERIN  (NITROSTAT ) 0.4 MG SL tablet, Place 1 tablet (0.4 mg total) under the tongue every 5 (five) minutes as needed for chest pain., Disp: 25 tablet, Rfl: 3   ondansetron  (ZOFRAN -ODT) 4 MG disintegrating tablet, DISSOLVE 1 TABLET ON THE TONGUE EVERY 6 HOURS AS NEEDED FOR NAUSEA OR VOMIT, Disp: 90 tablet, Rfl: 1   pantoprazole  (PROTONIX ) 40 MG tablet, TAKE 1 TABLET BY MOUTH TWICE DAILY, Disp: 180 tablet, Rfl: 1   polyethylene glycol (MIRALAX  / GLYCOLAX ) 17 g packet, Take 17 g by mouth 3 (three) times daily as needed for moderate constipation, mild constipation or severe  constipation. In water  or beverage, Disp: , Rfl:    Probiotic Product (CULTURELLE PROBIOTICS PO), Take 1 capsule by mouth daily. For woman, Disp: , Rfl:    QUEtiapine  (SEROQUEL  XR) 400 MG 24 hr tablet, Take 800 mg by mouth at bedtime., Disp: , Rfl:    simethicone  (MYLICON) 80 MG chewable tablet, Chew 80 mg by mouth  4 (four) times daily., Disp: , Rfl:    sodium phosphate  (FLEET) ENEM, Place 133 mLs (1 enema total) rectally daily as needed for up to 2 doses for severe constipation., Disp: 133 mL, Rfl: 0   tirzepatide  (MOUNJARO ) 15 MG/0.5ML Pen, Inject 15 mg into the skin once a week., Disp: 6 mL, Rfl: 3   valACYclovir  (VALTREX ) 1000 MG tablet, Take 0.5 tablets (500 mg total) by mouth daily., Disp: 45 tablet, Rfl: 1   VRAYLAR  capsule, Take 3 mg by mouth at bedtime., Disp: , Rfl:    zaleplon (SONATA) 10 MG capsule, Take 10-20 mg by mouth at bedtime., Disp: , Rfl:   Observations/Objective: Patient is well-developed, well-nourished in no acute distress.  Resting comfortably at home.  Head is normocephalic, atraumatic.  No labored breathing.  Speech is clear and coherent with logical content.  Patient is alert and oriented at baseline.    Assessment and Plan: 1. Dizziness (Primary) - meclizine (ANTIVERT) 25 MG tablet; Take 1 tablet (25 mg total) by mouth 3 (three) times daily as needed for dizziness.  Dispense: 30 tablet; Refill: 0 Symptoms of stroke discussed. She will go to hospital if symptoms worsen  Follow Up Instructions: I discussed the assessment and treatment plan with the patient. The patient was provided an opportunity to ask questions and all were answered. The patient agreed with the plan and demonstrated an understanding of the instructions.  A copy of instructions were sent to the patient via MyChart unless otherwise noted below.    The patient was advised to call back or seek an in-person evaluation if the symptoms worsen or if the condition fails to improve as anticipated.     Shontell Prosser W Isel Skufca, NP

## 2023-12-18 ENCOUNTER — Encounter: Payer: Self-pay | Admitting: Pharmacist

## 2023-12-18 DIAGNOSIS — F411 Generalized anxiety disorder: Secondary | ICD-10-CM | POA: Diagnosis not present

## 2023-12-18 DIAGNOSIS — F3173 Bipolar disorder, in partial remission, most recent episode manic: Secondary | ICD-10-CM | POA: Diagnosis not present

## 2023-12-18 DIAGNOSIS — F901 Attention-deficit hyperactivity disorder, predominantly hyperactive type: Secondary | ICD-10-CM | POA: Diagnosis not present

## 2023-12-18 DIAGNOSIS — F3112 Bipolar disorder, current episode manic without psychotic features, moderate: Secondary | ICD-10-CM | POA: Diagnosis not present

## 2023-12-18 NOTE — Progress Notes (Signed)
 Pharmacy Quality Measure Review  This patient is appearing on a report for being at risk of failing the adherence measure for diabetes medications this calendar year.   Medication: Mounjaro  15 mg Last fill date: 11/05/23 for 28 day supply  Insurance report was not up to date. No action needed at this time.  Medication has been refilled as of 12/02/23 x28 ds.  Refill due mid-Dec.

## 2023-12-19 ENCOUNTER — Ambulatory Visit: Admitting: Gastroenterology

## 2023-12-21 NOTE — Progress Notes (Unsigned)
 Date:  12/23/2023   ID:  Taniyah, Ballow October 24, 1960, MRN [id number]  Patient Location:  [address] [place] [address] [place] [address]   Provider location:   [facility] [place], [place] office  PCP:  Randeen, Laine LABOR, MD  Cardiologist:  Perla [facility]   Chief Complaint  Patient presents with   Follow-up    Patient c/o being off balance at times.     History of Present Illness:    AVERLY ERICSON is a 63 y.o. female  past medical history of Morbid obesity,  CAD Severe ostial ramus disease treated medically by catheterization August 2016, Cardiac catheterization April 06, 2020,  underwent stenting of OM branch hypertension,  hyperlipidemia,   Migraines/bipolar I Chronic tachycardia She presents today for follow-up of her coronary artery disease, hypertension, hyperlipidemia, cardiac catheterization with stent placement  Last seen by myself in clinic 8/24 In follow-up today reports doing well Has lost weight on Mounjaro , concerned about muscle loss Legs getting weaker, no regular exercise program  Continues on Repatha  and atorvsatatin  Goes to gym, [employer] Stress at home with husband with parkinson's d/o, Helping to take care of him  Reports blood pressure relatively stable Running high on today's visit Prior history of low blood pressures, she previously held olmesartan  and metoprolol    No significant chest pain, no shortness of breath on exertion   EKG personally reviewed by myself on todays visit EKG Interpretation Date/Time:  Tuesday December 23 2023 14:57:51 EST Ventricular Rate:  97 PR Interval:  204 QRS Duration:  102 QT Interval:  358 QTC Calculation: 454 R Axis:   -33  Text Interpretation: Normal sinus rhythm Possible Left atrial enlargement Left axis deviation Minimal voltage criteria for LVH, may be normal variant ( Cornell product ) Septal infarct , age undetermined When compared with ECG of 09-Sep-2022 15:28,  No significant change was found Confirmed by Perla Lye [phone]) on 12/23/2023 3:02:10 PM    Cardiac catheterization April 06, 2020 Severe OM disease, likely culprit vessel Chronic occlusion of small ramus branch Moderate to severe disease of proximal diagonal #2  Mild to moderate proximal LAD disease underwent stenting of OM branch  Sleep disorder, attributes to bipolar Feels her bipolar is well controlled  EKG personally reviewed by myself on todays visit EKG Interpretation Date/Time:  Tuesday December 23 2023 14:57:51 EST Ventricular Rate:  97 PR Interval:  204 QRS Duration:  102 QT Interval:  358 QTC Calculation: 454 R Axis:   -33  Text Interpretation: Normal sinus rhythm Possible Left atrial enlargement Left axis deviation Minimal voltage criteria for LVH, may be normal variant ( Cornell product ) Septal infarct , age undetermined When compared with ECG of 09-Sep-2022 15:28, No significant change was found Confirmed by Perla Lye [phone]) on 12/23/2023 3:02:10 PM   Past Medical History:  Diagnosis Date   ADD (attention deficit disorder) 11/19/2012   Allergic rhinitis, cause unspecified    Anxiety state, unspecified    Arthritis    Asymptomatic postmenopausal status (age-related) (natural)    Atypical hyperplasia of left breast 2000   Atypical mole 03/16/2018   R paraspinal mid upper back, excision   Atypical mole 02/17/2018   R spinal upper back, moderate   Atypical mole 08/11/2017   L sternum, excision   Atypical mole 05/27/2017   L post neck, moderate   Benign neoplasm of other and unspecified site of the digestive system    Bipolar  disorder (HCC)    Breast cancer (HCC)    Left Breast 6mm invasive CA left uiq, dcis left uoq and a lot ADH, ALH in both breasts.   CAD (coronary artery disease)    a. 08/2014 NSTEMI/Cath: LM nl, LAD 50p, D1/D2 min irregs, LCX nl, OM1 40, LPDA nl, RI 99ost (2.45mm vessel->med Rx), RCA nl, AM 60, nl EF; b. 11/2016 MV: EF 74%, no  ischemia (performed 2/2 dyspnea & new inf TWI).   Carcinoma of left breast (HCC) 06/02/2014   Cataract    Chronic diastolic CHF (congestive heart failure) (HCC)    a. 08/2014 Echo: EF 60-65%, Gr 1 DD, nl LA.   Clotting disorder    on blood thiner, Plavix    Cyst    on Achilles tendon   Depression    Diabetes mellitus (HCC)    frank   Dysuria    Edema    Family history of malignant neoplasm of gastrointestinal tract    Fibromyalgia    GERD (gastroesophageal reflux disease)    Glaucoma    pre glaucoma, 05/14/2018 pt. denies glaucoma   Headache    migraines   Hiatal hernia    History of alcoholism (HCC)    History of migraines    History of ovarian cyst    Hx of adenomatous colonic polyps    Insomnia, unspecified    Myocardial infarction (HCC) 09-04-14   OCD (obsessive compulsive disorder)    Osteopenia 09/25/2016   Femoral neck T -1.1  9/18   Other screening mammogram    Pure hypercholesterolemia    Rosacea    Subacute confusional state 11/19/2012   Tubular adenoma of colon 01/10/11   Ulcerative colitis, unspecified    Unspecified asthma(493.90)    Unspecified essential hypertension    Unspecified hypothyroidism    Unspecified vitamin D  deficiency    Vertigo    Past Surgical History:  Procedure Laterality Date   APPENDECTOMY     AXILLARY SENTINEL NODE BIOPSY Left 05/23/2014   Procedure: AXILLARY SENTINEL NODE BIOPSY;  Surgeon: Louanne KANDICE Muse, MD;  Location: [facility] ORS;  Service: General;  Laterality: Left;   BREAST BIOPSY  08/1998   on tamoxifen, atypical hyperplasia   BREAST BIOPSY Left 04/2014   BREAST SURGERY Left 08/1998   lumpectomy/ Dr Rollene   BREAST SURGERY Bilateral 05/23/2014   Mastectomy   BUNIONECTOMY Right    CARDIAC CATHETERIZATION N/A 09/05/2014   Procedure: Left Heart Cath and Coronary Angiography;  Surgeon: Deatrice DELENA Cage, MD;  Location: [facility];  Service: Cardiovascular;  Laterality: N/A;   CARDIAC CATHETERIZATION  09/05/2014    CATARACT EXTRACTION W/ INTRAOCULAR LENS IMPLANT Bilateral    CESAREAN SECTION  1996/1997   placenta previa, gest DM, pre-eclampsia   CHOLECYSTECTOMY  1997   adhesions also   COLONOSCOPY  01/2001   Ulcerative colitis   COLONOSCOPY  04/2004   UC, polyp   COLONOSCOPY  12/2006   UC, no polyps   CORONARY STENT INTERVENTION N/A 04/06/2020   Procedure: CORONARY STENT INTERVENTION;  Surgeon: Mady Bruckner, MD;  Location: [facility];  Service: Cardiovascular;  Laterality: N/A;   DEXA  04/1999 and 2010   normal   ESOPHAGOGASTRODUODENOSCOPY  09/2001   polyp   exercise stress test  11/2003   negative   FEMUR FRACTURE SURGERY Left    LEFT HEART CATH AND CORONARY ANGIOGRAPHY N/A 04/06/2020   Procedure: LEFT HEART CATH AND CORONARY ANGIOGRAPHY;  Surgeon: Perla Evalene PARAS, MD;  Location:  [facility];  Service: Cardiovascular;  Laterality: N/A;   MASTECTOMY Bilateral    MECKEL DIVERTICULUM EXCISION  1999   NASAL SINUS SURGERY  05/1997   nuclear stress test  06/2007   negative   precancerous mole removed     RIGHT AND LEFT HEART CATH     with 1 stent placed in March 2022   SIMPLE MASTECTOMY WITH AXILLARY SENTINEL NODE BIOPSY Bilateral 05/23/2014   Procedure: Bilateral simple mastectomy, left sentinel node biopsy ;  Surgeon: Louanne KANDICE Muse, MD;  Location: [facility];  Service: General;  Laterality: Bilateral;   Sleep study  11/2007   no apnea, but did snore (done by [facility])   TONSILLECTOMY  1984   TUBAL LIGATION     WISDOM TOOTH EXTRACTION  1980's     Allergies:   Ephedrine, Oxycodone , Aspirin , Erythromycin, Nsaids, and Rosuvastatin   Social History   Tobacco Use   Smoking status: Former    Current packs/day: 0.00    Average packs/day: 0.3 packs/day for 5.0 years (1.3 ttl pk-yrs)    Types: Cigarettes    Start date: 01/15/1988    Quit date: 01/14/1993    Years since quitting: 30.9   Smokeless tobacco: Never  Vaping Use   Vaping status: Never Used  Substance  Use Topics   Alcohol use: No    Alcohol/week: 0.0 standard drinks of alcohol    Comment: Recovered ETOH   Drug use: No     Current Outpatient Medications on File Prior to Visit  Medication Sig Dispense Refill   acetaminophen  (TYLENOL ) 650 MG CR tablet Take 1,300 mg by mouth 2 (two) times daily.     albuterol  (VENTOLIN  HFA) 108 (90 Base) MCG/ACT inhaler INHALE 1 PUFF INTO THE LUNGS EVERY 4 HOURS AS NEEDED FOR WHEEZING 1 each 3   amantadine  (SYMMETREL ) 100 MG capsule Take 100 mg by mouth 2 (two) times daily.     Ascorbic Acid  (VITAMIN C  PO) Take 1,000 mg by mouth daily.     aspirin  81 MG chewable tablet Chew 1 tablet (81 mg total) by mouth daily. 90 tablet 0   atorvastatin  (LIPITOR ) 40 MG tablet TAKE 1 TABLET BY MOUTH ONCE DAILY 90 tablet 3   B Complex Vitamins (VITAMIN B COMPLEX PO) Take 1 tablet by mouth every other day.     Calcium  Carb-Cholecalciferol  (CALTRATE 600+D3 PO) Take 1 tablet by mouth daily. 300 mg Vitamin D3     Cholecalciferol  (VITAMIN D3 PO) Take 1 tablet by mouth daily.     cyclobenzaprine  (FLEXERIL ) 5 MG tablet TAKE 1 TABLET BY MOUTH EVERY 8 HOURS AS NEEDED FOR MUSCLE SPASMS (BACK PAIN) 30 tablet 1   dexmethylphenidate (FOCALIN) 10 MG tablet Take by mouth. Taking 20 mg at breakfast and 10 mg at noon     diazepam  (VALIUM ) 10 MG tablet Take 2.5-7.5 mg by mouth daily as needed for anxiety.     docusate sodium  (COLACE) 100 MG capsule Take 100-300 mg by mouth daily as needed for mild constipation or moderate constipation.     empagliflozin  (JARDIANCE ) 25 MG TABS tablet Take 1 tablet (25 mg total) by mouth daily. 30 tablet 11   Evolocumab  (REPATHA  SURECLICK) 140 MG/ML SOAJ INJECT 140MG  SUBCUTANEOUSLY EVERY 2 WEEKS 6 mL 3   famotidine  (PEPCID ) 40 MG tablet Take 40 mg by mouth 2 (two) times daily. 30 min. Before meal and at bedtime     fluticasone  (FLONASE ) 50 MCG/ACT nasal spray Place 1 spray into both nostrils  daily. 16 g 6   gabapentin  (NEURONTIN ) 300 MG capsule TAKE 1 CAPSULE  BY MOUTH 3 TIMES DAILY 270 capsule 1   glucose blood (ONETOUCH VERIO) test strip Use to check blood sugar 3 times a day 300 each 12   ketoconazole  (NIZORAL ) 2 % cream Apply to affected areas one to two times daily as needed. For seborrheic dermatitis at face 30 g 11   lamoTRIgine  (LAMICTAL ) 200 MG tablet Take 400 mg by mouth daily.     Lancets (ONETOUCH DELICA PLUS LANCET33G) MISC Use to check blood sugar 3 times a day. 300 each 12   levothyroxine  (LEVOXYL ) 200 MCG tablet Take 1 tablet (200 mcg total) by mouth daily before breakfast. 90 tablet 3   loperamide  (IMODIUM  A-D) 2 MG tablet Take 2 mg by mouth daily as needed for diarrhea or loose stools.     meclizine  (ANTIVERT ) 25 MG tablet Take 1 tablet (25 mg total) by mouth 3 (three) times daily as needed for dizziness. 30 tablet 0   Melatonin 10 MG CAPS Take 10 mg by mouth at bedtime.     mesalamine  (LIALDA ) 1.2 g EC tablet Take 2 tablets (2.4 g total) by mouth daily. Office visit for further refills 180 tablet 0   Meth-Hyo-M Bl-Na Phos-Ph Sal (URO-MP ) 118 MG CAPS Take one pill by mouth four times daily as needed for urinary discomfort 60 capsule 0   metoprolol  tartrate (LOPRESSOR ) 25 MG tablet Take 1 tablet (25 mg total) by mouth 2 (two) times daily as needed (for tachycardia). 60 tablet 3   Multiple Vitamin (MULTIVITAMIN) tablet Take 1 tablet by mouth daily. Woman 50 plus     nitroGLYCERIN  (NITROSTAT ) 0.4 MG SL tablet Place 1 tablet (0.4 mg total) under the tongue every 5 (five) minutes as needed for chest pain. 25 tablet 3   ondansetron  (ZOFRAN -ODT) 4 MG disintegrating tablet DISSOLVE 1 TABLET ON THE TONGUE EVERY 6 HOURS AS NEEDED FOR NAUSEA OR VOMIT 90 tablet 1   pantoprazole  (PROTONIX ) 40 MG tablet TAKE 1 TABLET BY MOUTH TWICE DAILY 180 tablet 1   polyethylene glycol (MIRALAX  / GLYCOLAX ) 17 g packet Take 17 g by mouth 3 (three) times daily as needed for moderate constipation, mild constipation or severe constipation. In water  or beverage      Probiotic Product (CULTURELLE PROBIOTICS PO) Take 1 capsule by mouth daily. For woman     QUEtiapine  (SEROQUEL  XR) 400 MG 24 hr tablet Take 800 mg by mouth at bedtime.     simethicone  (MYLICON) 80 MG chewable tablet Chew 80 mg by mouth 4 (four) times daily.     tirzepatide  (MOUNJARO ) 15 MG/0.5ML Pen Inject 15 mg into the skin once a week. 6 mL 3   valACYclovir  (VALTREX ) 1000 MG tablet Take 0.5 tablets (500 mg total) by mouth daily. 45 tablet 1   VRAYLAR  capsule Take 3 mg by mouth at bedtime.     Wheat Dextrin (BENEFIBER DRINK MIX PO) Take by mouth at bedtime.     zaleplon (SONATA) 10 MG capsule Take 10-20 mg by mouth at bedtime.     Blood Glucose Monitoring Suppl (ONETOUCH VERIO REFLECT) w/Device KIT Use to check blood sugar 3 times a day 1 kit 0   latanoprost  (XALATAN ) 0.005 % ophthalmic solution Place 1 drop into both eyes 2 (two) times a week.  (Patient not taking: Reported on 12/23/2023)     sodium phosphate  (FLEET) ENEM Place 133 mLs (1 enema total) rectally daily as needed for up to 2  doses for severe constipation. (Patient not taking: Reported on 12/23/2023) 133 mL 0   No current facility-administered medications on file prior to visit.     Family Hx: The patient's family history includes Alzheimer's disease in her father; Breast cancer in an other family member; Colon cancer in her father; Colon polyps in her father; Coronary artery disease in her father, mother, and another family member; Crohn's disease in her mother; Dementia in her father; Diabetes in an other family member; Hypertension in her mother; Osteoporosis in her mother; Stroke in her cousin. There is no history of Esophageal cancer, Rectal cancer, or Stomach cancer.  ROS:   Please see the history of present illness.    Review of Systems  Constitutional: Negative.   HENT: Negative.    Respiratory: Negative.    Cardiovascular: Negative.   Gastrointestinal: Negative.   Musculoskeletal: Negative.   Neurological: Negative.    Psychiatric/Behavioral: Negative.    All other systems reviewed and are negative.    Labs/Other Tests and Data Reviewed:    Recent Labs: 09/11/2023: TSH 1.57 10/23/2023: ALT 19; BUN 13; Creatinine, Ser 0.97; Hemoglobin 16.3; Platelets 281; Potassium 3.5; Sodium 142   Recent Lipid Panel Lab Results  Component Value Date/Time   CHOL 122 05/09/2023 01:26 PM   TRIG 162.0 (H) 05/09/2023 01:26 PM   HDL 55.50 05/09/2023 01:26 PM   CHOLHDL 2 05/09/2023 01:26 PM   LDLCALC 34 05/09/2023 01:26 PM   LDLDIRECT 84.0 12/04/2021 10:06 AM    Wt Readings from Last 3 Encounters:  12/23/23 133 lb (60.3 kg)  11/11/23 138 lb 3.2 oz (62.7 kg)  10/23/23 137 lb (62.1 kg)     Exam:    BP (!) 140/92 (BP Location: Left Arm, Patient Position: Sitting, Cuff Size: Normal)   Pulse 97   Ht 5' 6 (1.676 m)   Wt 133 lb (60.3 kg)   LMP 08/23/2006 (Approximate) Comment: tubal ligation  SpO2 99%   BMI 21.47 kg/m  Constitutional:  oriented to person, place, and time. No distress.  HENT:  Head: Normocephalic and atraumatic.  Eyes:  no discharge. No scleral icterus.  Neck: Normal range of motion. Neck supple. No JVD present.  Cardiovascular: Normal rate, regular rhythm, normal heart sounds and intact distal pulses. Exam reveals no gallop and no friction rub. No edema No murmur heard. Pulmonary/Chest: Effort normal and breath sounds normal. No stridor. No respiratory distress.  no wheezes.  no rales.  no tenderness.  Abdominal: Soft.  no distension.  no tenderness.  Musculoskeletal: Normal range of motion.  no  tenderness or deformity.  Neurological:  normal muscle tone. Coordination normal. No atrophy Skin: Skin is warm and dry. No rash noted. not diaphoretic.  Psychiatric:  normal mood and affect. behavior is normal. Thought content normal.    ASSESSMENT & PLAN:    Problem List Items Addressed This Visit       Cardiology Problems   Essential hypertension   Relevant Orders   EKG 12-Lead  (Completed)     Other   DM (diabetes mellitus) (HCC) (Chronic)   Relevant Orders   EKG 12-Lead (Completed)   Other Visit Diagnoses       Coronary artery disease of native artery of native heart with stable angina pectoris    -  Primary   Relevant Orders   EKG 12-Lead (Completed)     Mixed hyperlipidemia         Hyperlipidemia, mixed         History of  tobacco use          CAD with stable angina In 2022 , stent placement ostial OM vessel Currently with no symptoms of angina. No further workup at this time. Continue current medication regimen. On asa , Repatha , statin Zetia  held as numbers at goal  Hyperlipidemia Numbers well-controlled Continue Lipitor  with Repatha , off Zetia   Bipolar Followed by psychiatrist Stable  Obesity Weight trending down on Mounjaro  Doing well  Diabetes with complications A1C well controlled Has had weight loss  Essential hypertension Olmesartan  and metoprolol  held given hypotension recommend monitoring blood pressure at home given numbers elevated today likely above her baseline    Signed, Rachyl Wuebker, MD  [facility] [address], [place], [place] [address]

## 2023-12-23 ENCOUNTER — Ambulatory Visit: Attending: Cardiovascular Disease | Admitting: Cardiovascular Disease

## 2023-12-23 ENCOUNTER — Encounter: Payer: Self-pay | Admitting: Cardiovascular Disease

## 2023-12-23 VITALS — BP 140/92 | HR 97 | Ht 66.0 in | Wt 133.0 lb

## 2023-12-23 DIAGNOSIS — I10 Essential (primary) hypertension: Secondary | ICD-10-CM

## 2023-12-23 DIAGNOSIS — Z87891 Personal history of nicotine dependence: Secondary | ICD-10-CM | POA: Diagnosis not present

## 2023-12-23 DIAGNOSIS — E782 Mixed hyperlipidemia: Secondary | ICD-10-CM | POA: Diagnosis not present

## 2023-12-23 DIAGNOSIS — I25118 Atherosclerotic heart disease of native coronary artery with other forms of angina pectoris: Secondary | ICD-10-CM | POA: Diagnosis not present

## 2023-12-23 DIAGNOSIS — E1159 Type 2 diabetes mellitus with other circulatory complications: Secondary | ICD-10-CM

## 2023-12-23 MED ORDER — ATORVASTATIN CALCIUM 40 MG PO TABS: 40.0000 mg | ORAL_TABLET | Freq: Every day | ORAL | 3 refills | Status: AC

## 2023-12-23 MED ORDER — NITROGLYCERIN 0.4 MG SL SUBL: 0.4000 mg | SUBLINGUAL_TABLET | SUBLINGUAL | 3 refills | Status: AC | PRN

## 2023-12-23 NOTE — Patient Instructions (Signed)

## 2023-12-29 ENCOUNTER — Other Ambulatory Visit: Payer: Self-pay | Admitting: Internal Medicine

## 2023-12-30 ENCOUNTER — Other Ambulatory Visit (HOSPITAL_COMMUNITY): Payer: Self-pay

## 2023-12-30 ENCOUNTER — Telehealth: Payer: Self-pay

## 2023-12-30 NOTE — Telephone Encounter (Signed)
 Pharmacy Patient Advocate Encounter   Received notification from CoverMyMeds that prior authorization for REPATHA  is required/requested.   Insurance verification completed.   The patient is insured through [facility] ADVANTAGE/RX ADVANCE.   Per test claim: PA required; PA submitted to above mentioned insurance via Latent Key/confirmation #/EOC [id number] Status is pending

## 2024-01-01 NOTE — Telephone Encounter (Signed)
 Pharmacy Patient Advocate Encounter  Received notification from [facility] ADVANTAGE/RX ADVANCE that Prior Authorization for REPATHA  has been APPROVED from 12/30/23 to 06/29/24

## 2024-01-30 ENCOUNTER — Encounter: Payer: Self-pay | Admitting: Gastroenterology

## 2024-01-30 ENCOUNTER — Ambulatory Visit: Admitting: Gastroenterology

## 2024-01-30 VITALS — BP 120/78 | HR 80 | Ht 66.0 in | Wt 128.0 lb

## 2024-01-30 DIAGNOSIS — K5100 Ulcerative (chronic) pancolitis without complications: Secondary | ICD-10-CM

## 2024-01-30 DIAGNOSIS — K5900 Constipation, unspecified: Secondary | ICD-10-CM | POA: Diagnosis not present

## 2024-01-30 DIAGNOSIS — Z860100 Personal history of colon polyps, unspecified: Secondary | ICD-10-CM

## 2024-01-30 DIAGNOSIS — Z7985 Long-term (current) use of injectable non-insulin antidiabetic drugs: Secondary | ICD-10-CM | POA: Diagnosis not present

## 2024-01-30 DIAGNOSIS — E1159 Type 2 diabetes mellitus with other circulatory complications: Secondary | ICD-10-CM | POA: Diagnosis not present

## 2024-01-30 DIAGNOSIS — E1359 Other specified diabetes mellitus with other circulatory complications: Secondary | ICD-10-CM

## 2024-01-30 DIAGNOSIS — I214 Non-ST elevation (NSTEMI) myocardial infarction: Secondary | ICD-10-CM

## 2024-01-30 NOTE — Progress Notes (Signed)
 Noted

## 2024-01-30 NOTE — Patient Instructions (Addendum)
 I will call you next month to schedule your colonoscopy in April.  VISIT SUMMARY:  During your visit, we discussed your chronic constipation, ulcerative colitis, and redundant colon. We reviewed your current symptoms, treatments, and made adjustments to your bowel regimen. We also discussed your upcoming routine colonoscopy.  YOUR PLAN:  -CHRONIC CONSTIPATION: Chronic constipation is a long-term condition where you have infrequent or difficult bowel movements. Your constipation is influenced by medication, stress, decreased activity, and a redundant colon. We recommend continuing daily Miralax , with the option to increase to twice daily if needed. You should combine Miralax  and Benefiber in the same drink and continue using stool softeners as needed. We also discussed the importance of diet, hydration, physical activity, and managing stress to help with constipation.  -ULCERATIVE COLITIS: Ulcerative colitis is a chronic inflammatory bowel disease that causes inflammation and ulcers in your digestive tract. You should continue your current mesalamine  therapy to manage this condition.  -REDUNDANT COLON: A redundant colon means you have extra loops in your colon, which can contribute to constipation. It is important to maintain a regular bowel regimen to manage this condition.  -SCREENING COLONOSCOPY: A colonoscopy is a procedure to examine the inside of your colon. You are due for a routine surveillance colonoscopy in April as part of your ulcerative colitis management. We have arranged the scheduling with Dr. Abran and recommend a two-day bowel preparation using Suprep and Miralax .  INSTRUCTIONS:  Please follow up with Dr. Abran for your routine surveillance colonoscopy in April. Use the two-day bowel preparation with Suprep and Miralax  as recommended.

## 2024-01-30 NOTE — Progress Notes (Signed)
 "  Chief Complaint: Follow-up of constipation Primary GI MD: Dr. Abran  HPI:  64 year old female with a past medical history of arthritis, fibromyalgia, hypertension, coronary artery disease s/p NSTEMI 08/2014 and s/p stent placement 03/2020 (on ASA 81 mg daily, no longer on Plavix ), diabetes mellitus type 2, breast cancer, bipolar disorder, OCD, chronic GERD, chronic constipation, colon polyps and ulcerative colitis.     Patient last seen by Elida Shawl NP 10/2023, see that note for details  CT abdomen pelvis with contrast 10/23/2023 for right lower quadrant pain showed prominent stool throughout the colon favoring constipation and a kidney stone, otherwise unremarkable  ------------------TODAY-----------------------  Discussed the use of AI scribe software for clinical note transcription with the patient, who gave verbal consent to proceed.  History of Present Illness   Lindsay Fry is a 64 year old female with ulcerative colitis, chronic constipation, and redundant colon who presents for follow-up of constipation.  Constipation has been present since childhood and persists despite various treatments. In October 2025, she developed right upper quadrant pain, abdominal swelling, and persistent nausea, culminating in emesis of green bile with undigested peanut fragments. She presented to the emergency department the following day for ongoing symptoms. CT imaging revealed severe constipation; laboratory studies were unremarkable. She was reassured by the absence of bowel obstruction.  Current constipation is exacerbated by Mounjaro , which she has taken for approximately 1.5 years at a maximum dose of 15 mg for the past 9-12 months. She previously used Ozempic . Her bowel regimen includes daily Miralax , Benefiber at night, and frequent stool softeners. She occasionally misses Miralax  doses, particularly on busy mornings. Stool consistency is described as like a cow pie when the  regimen is effective, though Miralax  is slow to work. She has recently been more consistent with her regimen for about a week. Constipation worsens during the holidays and winter, coinciding with decreased physical activity and dietary changes. She previously participated in aquatic exercise until September 2025 and has not resumed regular gym exercise.  A redundant colon has been noted on prior colonoscopy. She expresses concern about diverticulitis, particularly after eating a large amount of popcorn and experiencing swelling and tenderness. Personal history includes Meckel's diverticulum resection in 1997 or 1998. Family history is notable for her father having diverticulitis. Colonoscopy surveillance is up to date, with the next due in April 2026.  Ulcerative colitis is managed with mesalamine . No symptoms suggestive of an acute flare or complications are reported at this visit.  She has recently experienced increased stress, depression, and anxiety related to financial issues and uncertainty. She is under psychiatric care and currently takes vraylar , Lamictal , and, as of 3-4 days ago, Zoloft 25 mg daily, with slight improvement in mood since starting Zoloft.   PREVIOUS GI WORKUP   Colonoscopy 04/18/2021: - One 3 mm polyp in the ascending colon, removed with a cold snare. Resected and retrieved.  - Melanosis coli  - The entire examined colon is normal on direct and retroflexion views. No obvious active colitis. - Redundant colon - Recall colonoscopy 3 years    1. Surgical [P], right colon bxs (hx UC R/O dysplasia, surveillance) MILD NONSPECIFIC INFLAMMATION CONSISTENT WITH PREP RELATED CHANGES. THERE ARE NO DIAGNOSTIC FEATURES OF INFLAMMATORY BOWEL DISEASE IN ACTIVE PHASE, MICROSCOPIC COLITIS AND COLLAGENOUS COLITIS. NEGATIVE FOR DYSPLASIA AND MALIGNANCY. 2. Surgical [P], colon, ascending, polyp (1) TUBULAR ADENOMA. NEGATIVE FOR HIGH-GRADE DYSPLASIA. 3. Surgical [P], transverse colon bxs  hx UC, surveillance bxs MILD NONSPECIFIC INFLAMMATION CONSISTENT WITH PREP RELATED CHANGES.  THERE ARE NO DIAGNOSTIC FEATURES OF INFLAMMATORY BOWEL DISEASE IN ACTIVE PHASE, MICROSCOPIC COLITIS AND COLLAGENOUS COLITIS. NEGATIVE FOR DYSPLASIA AND MALIGNANCY 4. Surgical [P], left colon bxs (hx ulcerative colitis) surveillance bxs MILD NONSPECIFIC INFLAMMATION CONSISTENT WITH PREP RELATED CHANGES. THERE ARE NO DIAGNOSTIC FEATURES OF INFLAMMATORY BOWEL DISEASE IN ACTIVE PHASE, MICROSCOPIC COLITIS AND COLLAGENOUS COLITIS. NEGATIVE FOR DYSPLASIA AND MALIGNANCY. 5. Surgical [P], colon, recto-sigmoid bxs FRAGMENTS OF RECTAL MUCOSA WITH NO SIGNIFICANT MICROSCOPIC ABNORMALITIES. NEGATIVE FOR INFLAMMATORY BOWEL DISEASE AND NEOPLASM.   Colonoscopy 05/14/2018: 2 diminutive polyps at that time. Exam was otherwise normal. Random colon biopsies were unremarkable. The small polyp was tubular adenoma.     Past Medical History:  Diagnosis Date   ADD (attention deficit disorder) 11/19/2012   Allergic rhinitis, cause unspecified    Anxiety state, unspecified    Arthritis    Asymptomatic postmenopausal status (age-related) (natural)    Atypical hyperplasia of left breast 2000   Atypical mole 03/16/2018   R paraspinal mid upper back, excision   Atypical mole 02/17/2018   R spinal upper back, moderate   Atypical mole 08/11/2017   L sternum, excision   Atypical mole 05/27/2017   L post neck, moderate   Benign neoplasm of other and unspecified site of the digestive system    Bipolar disorder (HCC)    Breast cancer (HCC)    Left Breast 6mm invasive CA left uiq, dcis left uoq and a lot ADH, ALH in both breasts.   CAD (coronary artery disease)    a. 08/2014 NSTEMI/Cath: LM nl, LAD 50p, D1/D2 min irregs, LCX nl, OM1 40, LPDA nl, RI 99ost (2.56mm vessel->med Rx), RCA nl, AM 60, nl EF; b. 11/2016 MV: EF 74%, no ischemia (performed 2/2 dyspnea & new inf TWI).   Carcinoma of left breast (HCC) 06/02/2014   Cataract     Chronic diastolic CHF (congestive heart failure) (HCC)    a. 08/2014 Echo: EF 60-65%, Gr 1 DD, nl LA.   Clotting disorder    on blood thiner, Plavix    Cyst    on Achilles tendon   Depression    Diabetes mellitus (HCC)    frank   Dysuria    Edema    Family history of malignant neoplasm of gastrointestinal tract    Fibromyalgia    GERD (gastroesophageal reflux disease)    Glaucoma    pre glaucoma, 05/14/2018 pt. denies glaucoma   Headache    migraines   Hiatal hernia    History of alcoholism (HCC)    History of migraines    History of ovarian cyst    Hx of adenomatous colonic polyps    Insomnia, unspecified    Myocardial infarction (HCC) 09-04-14   OCD (obsessive compulsive disorder)    Osteopenia 09/25/2016   Femoral neck T -1.1  9/18   Other screening mammogram    Pure hypercholesterolemia    Rosacea    Subacute confusional state 11/19/2012   Tubular adenoma of colon 01/10/11   Ulcerative colitis, unspecified    Unspecified asthma(493.90)    Unspecified essential hypertension    Unspecified hypothyroidism    Unspecified vitamin D  deficiency    Vertigo     Past Surgical History:  Procedure Laterality Date   APPENDECTOMY     AXILLARY SENTINEL NODE BIOPSY Left 05/23/2014   Procedure: AXILLARY SENTINEL NODE BIOPSY;  Surgeon: Louanne KANDICE Muse, MD;  Location: [facility];  Service: General;  Laterality: Left;   BREAST BIOPSY  08/1998   on tamoxifen, atypical  hyperplasia   BREAST BIOPSY Left 04/2014   BREAST SURGERY Left 08/1998   lumpectomy/ Dr Rollene   BREAST SURGERY Bilateral 05/23/2014   Mastectomy   BUNIONECTOMY Right    CARDIAC CATHETERIZATION N/A 09/05/2014   Procedure: Left Heart Cath and Coronary Angiography;  Surgeon: Deatrice DELENA Cage, MD;  Location: [facility];  Service: Cardiovascular;  Laterality: N/A;   CARDIAC CATHETERIZATION  09/05/2014   CATARACT EXTRACTION W/ INTRAOCULAR LENS IMPLANT Bilateral    CESAREAN SECTION  1996/1997   placenta  previa, gest DM, pre-eclampsia   CHOLECYSTECTOMY  1997   adhesions also   COLONOSCOPY  01/2001   Ulcerative colitis   COLONOSCOPY  04/2004   UC, polyp   COLONOSCOPY  12/2006   UC, no polyps   CORONARY STENT INTERVENTION N/A 04/06/2020   Procedure: CORONARY STENT INTERVENTION;  Surgeon: Mady Bruckner, MD;  Location: [facility];  Service: Cardiovascular;  Laterality: N/A;   DEXA  04/1999 and 2010   normal   ESOPHAGOGASTRODUODENOSCOPY  09/2001   polyp   exercise stress test  11/2003   negative   FEMUR FRACTURE SURGERY Left    LEFT HEART CATH AND CORONARY ANGIOGRAPHY N/A 04/06/2020   Procedure: LEFT HEART CATH AND CORONARY ANGIOGRAPHY;  Surgeon: Perla Evalene PARAS, MD;  Location: [facility];  Service: Cardiovascular;  Laterality: N/A;   MASTECTOMY Bilateral    MECKEL DIVERTICULUM EXCISION  1999   NASAL SINUS SURGERY  05/1997   nuclear stress test  06/2007   negative   precancerous mole removed     RIGHT AND LEFT HEART CATH     with 1 stent placed in March 2022   SIMPLE MASTECTOMY WITH AXILLARY SENTINEL NODE BIOPSY Bilateral 05/23/2014   Procedure: Bilateral simple mastectomy, left sentinel node biopsy ;  Surgeon: Louanne KANDICE Muse, MD;  Location: [facility];  Service: General;  Laterality: Bilateral;   Sleep study  11/2007   no apnea, but did snore (done by [facility])   TONSILLECTOMY  1984   TUBAL LIGATION     WISDOM TOOTH EXTRACTION  1980's    Current Outpatient Medications  Medication Sig Dispense Refill   acetaminophen  (TYLENOL ) 650 MG CR tablet Take 1,300 mg by mouth 2 (two) times daily.     albuterol  (VENTOLIN  HFA) 108 (90 Base) MCG/ACT inhaler INHALE 1 PUFF INTO THE LUNGS EVERY 4 HOURS AS NEEDED FOR WHEEZING 1 each 3   amantadine  (SYMMETREL ) 100 MG capsule Take 100 mg by mouth 2 (two) times daily.     Ascorbic Acid  (VITAMIN C  PO) Take 1,000 mg by mouth daily.     aspirin  81 MG chewable tablet Chew 1 tablet (81 mg total) by mouth daily. (Patient  taking differently: Chew 81 mg by mouth daily. As needed) 90 tablet 0   atorvastatin  (LIPITOR ) 40 MG tablet Take 1 tablet (40 mg total) by mouth daily. 90 tablet 3   B Complex Vitamins (VITAMIN B COMPLEX PO) Take 1 tablet by mouth every other day.     Calcium  Carb-Cholecalciferol  (CALTRATE 600+D3 PO) Take 1 tablet by mouth daily. 300 mg Vitamin D3     Cholecalciferol  (VITAMIN D3 PO) Take 1 tablet by mouth daily.     cyclobenzaprine  (FLEXERIL ) 5 MG tablet TAKE 1 TABLET BY MOUTH EVERY 8 HOURS AS NEEDED FOR MUSCLE SPASMS (BACK PAIN) 30 tablet 1   dexmethylphenidate (FOCALIN) 10 MG tablet Take by mouth. Taking 20 mg at breakfast and 10 mg at noon  diazepam  (VALIUM ) 10 MG tablet Take 2.5-7.5 mg by mouth daily as needed for anxiety.     docusate sodium  (COLACE) 100 MG capsule Take 100-300 mg by mouth daily as needed for mild constipation or moderate constipation.     empagliflozin  (JARDIANCE ) 25 MG TABS tablet Take 1 tablet (25 mg total) by mouth daily. 30 tablet 11   Evolocumab  (REPATHA  SURECLICK) 140 MG/ML SOAJ INJECT 140MG  SUBCUTANEOUSLY EVERY 2 WEEKS 6 mL 3   famotidine  (PEPCID ) 40 MG tablet Take 40 mg by mouth 2 (two) times daily. 30 min. Before meal and at bedtime     fluticasone  (FLONASE ) 50 MCG/ACT nasal spray Place 1 spray into both nostrils daily. 16 g 6   gabapentin  (NEURONTIN ) 300 MG capsule TAKE 1 CAPSULE BY MOUTH 3 TIMES DAILY 270 capsule 1   glucose blood (ONETOUCH VERIO) test strip Use to check blood sugar 3 times a day 300 each 12   ketoconazole  (NIZORAL ) 2 % cream Apply to affected areas one to two times daily as needed. For seborrheic dermatitis at face 30 g 11   lamoTRIgine  (LAMICTAL ) 200 MG tablet Take 400 mg by mouth daily.     Lancets (ONETOUCH DELICA PLUS LANCET33G) MISC Use to check blood sugar 3 times a day. 300 each 12   latanoprost  (XALATAN ) 0.005 % ophthalmic solution Place 1 drop into both eyes 2 (two) times a week.      levothyroxine  (LEVOXYL ) 200 MCG tablet Take 1  tablet (200 mcg total) by mouth daily before breakfast. 90 tablet 3   loperamide  (IMODIUM  A-D) 2 MG tablet Take 2 mg by mouth daily as needed for diarrhea or loose stools.     meclizine  (ANTIVERT ) 25 MG tablet Take 1 tablet (25 mg total) by mouth 3 (three) times daily as needed for dizziness. 30 tablet 0   Melatonin 10 MG CAPS Take 10 mg by mouth at bedtime.     mesalamine  (LIALDA ) 1.2 g EC tablet TAKE 2 TABLETS BY MOUTH ONCE DAILY 180 tablet 1   Meth-Hyo-M Bl-Na Phos-Ph Sal (URO-MP ) 118 MG CAPS Take one pill by mouth four times daily as needed for urinary discomfort 60 capsule 0   metoprolol  tartrate (LOPRESSOR ) 25 MG tablet Take 1 tablet (25 mg total) by mouth 2 (two) times daily as needed (for tachycardia). 60 tablet 3   Multiple Vitamin (MULTIVITAMIN) tablet Take 1 tablet by mouth daily. Woman 50 plus     nitroGLYCERIN  (NITROSTAT ) 0.4 MG SL tablet Place 1 tablet (0.4 mg total) under the tongue every 5 (five) minutes as needed for chest pain. 25 tablet 3   ondansetron  (ZOFRAN -ODT) 4 MG disintegrating tablet DISSOLVE 1 TABLET ON THE TONGUE EVERY 6 HOURS AS NEEDED FOR NAUSEA OR VOMIT 90 tablet 1   pantoprazole  (PROTONIX ) 40 MG tablet TAKE 1 TABLET BY MOUTH TWICE DAILY 180 tablet 1   polyethylene glycol (MIRALAX  / GLYCOLAX ) 17 g packet Take 17 g by mouth 3 (three) times daily as needed for moderate constipation, mild constipation or severe constipation. In water  or beverage     Probiotic Product (CULTURELLE PROBIOTICS PO) Take 1 capsule by mouth daily. For woman     QUEtiapine  (SEROQUEL  XR) 400 MG 24 hr tablet Take 800 mg by mouth at bedtime.     sertraline (ZOLOFT) 25 MG tablet Take 25 mg by mouth daily.     simethicone  (MYLICON) 80 MG chewable tablet Chew 80 mg by mouth 4 (four) times daily.     sodium phosphate  (FLEET) ENEM Place  133 mLs (1 enema total) rectally daily as needed for up to 2 doses for severe constipation. 133 mL 0   tirzepatide  (MOUNJARO ) 15 MG/0.5ML Pen Inject 15 mg into the skin  once a week. 6 mL 3   valACYclovir  (VALTREX ) 1000 MG tablet Take 0.5 tablets (500 mg total) by mouth daily. 45 tablet 1   VRAYLAR  capsule Take 3 mg by mouth at bedtime.     Wheat Dextrin (BENEFIBER DRINK MIX PO) Take by mouth at bedtime.     zaleplon (SONATA) 10 MG capsule Take 10-20 mg by mouth at bedtime.     Blood Glucose Monitoring Suppl (ONETOUCH VERIO REFLECT) w/Device KIT Use to check blood sugar 3 times a day 1 kit 0   No current facility-administered medications for this visit.    Allergies as of 01/30/2024 - Review Complete 01/30/2024  Allergen Reaction Noted   Ephedrine Other (See Comments) 06/27/2011   Oxycodone  Other (See Comments) 11/23/2014   Aspirin  Other (See Comments) 01/07/2007   Erythromycin Other (See Comments) 01/07/2007   Nsaids  01/07/2007   Rosuvastatin Other (See Comments) 08/09/2009    Family History  Problem Relation Age of Onset   Coronary artery disease Father    Colon cancer Father    Alzheimer's disease Father    Dementia Father    Colon polyps Father    Coronary artery disease Mother    Hypertension Mother    Osteoporosis Mother    Crohn's disease Mother        crohns colitis   Breast cancer Other        great aunts   Coronary artery disease Other        Uncle (also AAA)   Diabetes Other        remote family history   Stroke Cousin    Esophageal cancer Neg Hx    Rectal cancer Neg Hx    Stomach cancer Neg Hx     Social History   Socioeconomic History   Marital status: Married    Spouse name: alan   Number of children: 2   Years of education: college   Highest education level: Bachelor's degree (e.g., BA, AB, BS)  Occupational History   Occupation: unemployed  Tobacco Use   Smoking status: Former    Current packs/day: 0.00    Average packs/day: 0.3 packs/day for 5.0 years (1.3 ttl pk-yrs)    Types: Cigarettes    Start date: 01/15/1988    Quit date: 01/14/1993    Years since quitting: 31.0   Smokeless tobacco: Never  Vaping Use    Vaping status: Never Used  Substance and Sexual Activity   Alcohol use: No    Alcohol/week: 0.0 standard drinks of alcohol    Comment: Recovered ETOH   Drug use: No   Sexual activity: Not Currently    Partners: Male  Other Topics Concern   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      Works-doing telephone triage   Social Drivers of Health   Tobacco Use: Medium Risk (12/23/2023)   Patient History    Smoking Tobacco Use: Former    Smokeless Tobacco Use: Never    Passive Exposure: Not on file  Financial Resource Strain: Low Risk (10/09/2023)   Overall Financial Resource Strain (CARDIA)    Difficulty of Paying Living  Expenses: Not very hard  Food Insecurity: No Food Insecurity (10/09/2023)   Epic    Worried About Programme Researcher, Broadcasting/film/video in the Last Year: Never true    Ran Out of Food in the Last Year: Never true  Transportation Needs: No Transportation Needs (10/09/2023)   Epic    Lack of Transportation (Medical): No    Lack of Transportation (Non-Medical): No  Physical Activity: Sufficiently Active (10/09/2023)   Exercise Vital Sign    Days of Exercise per Week: 3 days    Minutes of Exercise per Session: 60 min  Stress: No Stress Concern Present (10/09/2023)   [employer] of Occupational Health - Occupational Stress Questionnaire    Feeling of Stress: Only a little  Social Connections: Socially Integrated (10/09/2023)   Social Connection and Isolation Panel    Frequency of Communication with Friends and Family: More than three times a week    Frequency of Social Gatherings with Friends and Family: More than three times a week    Attends Religious Services: More than 4 times per year    Active Member of Clubs or Organizations: Yes    Attends Banker Meetings: More than 4 times per year    Marital Status: Married  Catering Manager Violence: Not At Risk (10/09/2023)    Epic    Fear of Current or Ex-Partner: No    Emotionally Abused: No    Physically Abused: No    Sexually Abused: No  Depression (PHQ2-9): Low Risk (10/09/2023)   Depression (PHQ2-9)    PHQ-2 Score: 1  Alcohol Screen: Low Risk (10/09/2023)   Alcohol Screen    Last Alcohol Screening Score (AUDIT): 0  Housing: Low Risk (10/09/2023)   Epic    Unable to Pay for Housing in the Last Year: No    Number of Times Moved in the Last Year: 0    Homeless in the Last Year: No  Utilities: Not At Risk (10/09/2023)   Epic    Threatened with loss of utilities: No  Health Literacy: Adequate Health Literacy (10/09/2023)   B1300 Health Literacy    Frequency of need for help with medical instructions: Never    Review of Systems:    Constitutional: No weight loss, fever, chills, weakness or fatigue HEENT: Eyes: No change in vision               Ears, Nose, Throat:  No change in hearing or congestion Skin: No rash or itching Cardiovascular: No chest pain, chest pressure or palpitations   Respiratory: No SOB or cough Gastrointestinal: See HPI and otherwise negative Genitourinary: No dysuria or change in urinary frequency Neurological: No headache, dizziness or syncope Musculoskeletal: No new muscle or joint pain Hematologic: No bleeding or bruising Psychiatric: No history of depression or anxiety    Physical Exam:  Vital signs: BP 120/78   Pulse 80   Ht 5' 6 (1.676 m)   Wt 128 lb (58.1 kg)   LMP 08/23/2006 Comment: tubal ligation  BMI 20.66 kg/m   Constitutional: NAD, alert and cooperative Head:  Normocephalic and atraumatic. Eyes:   PEERL, EOMI. No icterus. Conjunctiva pink. Respiratory: Respirations even and unlabored. Lungs clear to auscultation bilaterally.   No wheezes, crackles, or rhonchi.  Cardiovascular:  Regular rate and rhythm. No peripheral edema, cyanosis or pallor.  Gastrointestinal:  Soft, nondistended, nontender. No rebound or guarding. Normal bowel sounds. No appreciable  masses or hepatomegaly. Rectal:  Declines Msk:  Symmetrical without gross deformities. Without edema, no  deformity or joint abnormality.  Neurologic:  Alert and  oriented x4;  grossly normal neurologically.  Skin:   Dry and intact without significant lesions or rashes. Psychiatric: Oriented to person, place and time. Demonstrates good judgement and reason without abnormal affect or behaviors.  Physical Exam    RELEVANT LABS AND IMAGING: CBC    Component Value Date/Time   WBC 5.4 10/23/2023 1302   RBC 5.19 (H) 10/23/2023 1302   HGB 16.3 (H) 10/23/2023 1302   HGB 14.6 09/16/2023 1326   HCT 49.3 (H) 10/23/2023 1302   PLT 281 10/23/2023 1302   PLT 248 09/16/2023 1326   MCV 95.0 10/23/2023 1302   MCH 31.4 10/23/2023 1302   MCHC 33.1 10/23/2023 1302   RDW 11.9 10/23/2023 1302   LYMPHSABS 1.8 09/16/2023 1326   MONOABS 0.5 09/16/2023 1326   EOSABS 0.3 09/16/2023 1326   BASOSABS 0.1 09/16/2023 1326    CMP     Component Value Date/Time   NA 142 10/23/2023 1302   K 3.5 10/23/2023 1302   CL 105 10/23/2023 1302   CO2 26 10/23/2023 1302   GLUCOSE 100 (H) 10/23/2023 1302   BUN 13 10/23/2023 1302   CREATININE 0.97 10/23/2023 1302   CREATININE 0.76 09/16/2023 1326   CALCIUM  9.0 10/23/2023 1302   PROT 6.5 10/23/2023 1302   ALBUMIN 3.9 10/23/2023 1302   AST 22 10/23/2023 1302   AST 36 09/16/2023 1326   ALT 19 10/23/2023 1302   ALT 30 09/16/2023 1326   ALKPHOS 53 10/23/2023 1302   BILITOT 0.5 10/23/2023 1302   BILITOT 0.7 09/16/2023 1326   GFRNONAA >60 10/23/2023 1302   GFRNONAA >60 09/16/2023 1326   GFRAA >60 08/30/2019 1437     Assessment/Plan:   64 year old female with a history of ulcerative colitis on oral Mesalamine  2.4 g p.o. daily who presents with constipation  Put on miralax  at last OV with intermittent improvement and intermittent consistency Seen in ED 10/2023 with CTAP w contrast showing constipation, otherwise unrevealing. On mounjaro , history of constipation  since childhood, redundant colon, increase extrinsic stress likely contributing as well as dietary factors of decreased exercise during the winter. - Recommend consistent use of MiraLAX  on a daily basis, increase to twice daily if needed - Continue mesalamine  - Continue increase water , increase fiber, increase exercise  History of colon polyps.  Colonoscopy 04/2021 identified 1 tubular adenomatous polyp removed from the ascending colon. - Next colon polyp surveillance colonoscopy due 04/2024 - Schedule colonoscopy, 2-day prep   History of CAD, remote NSTEMI and status post stent placement 03/2020.  On ASA 81 mg daily, no longer on Plavix .  No angina.   Diabetes mellitus type 2 On Mounjaro   Lindsay Fry [facility] 01/30/2024, 11:38 AM  Cc: Tower, Lindsay LABOR, MD "

## 2024-02-06 ENCOUNTER — Other Ambulatory Visit: Payer: Self-pay | Admitting: Internal Medicine

## 2024-02-06 DIAGNOSIS — R110 Nausea: Secondary | ICD-10-CM

## 2024-02-20 ENCOUNTER — Other Ambulatory Visit: Payer: Self-pay | Admitting: Family Medicine

## 2024-03-11 ENCOUNTER — Other Ambulatory Visit

## 2024-03-15 ENCOUNTER — Ambulatory Visit: Admitting: Endocrinology

## 2024-10-05 ENCOUNTER — Ambulatory Visit: Admitting: Dermatology

## 2024-10-12 ENCOUNTER — Ambulatory Visit

## 2024-10-13 ENCOUNTER — Ambulatory Visit
